# Patient Record
Sex: Female | Born: 1950 | Race: Black or African American | Hispanic: No | Marital: Single | Smoking: Former smoker
Health system: Southern US, Community
[De-identification: ages and names within clinical notes are randomized; demographics above are authoritative.]

## PROBLEM LIST (undated history)

## (undated) DIAGNOSIS — F419 Anxiety disorder, unspecified: Secondary | ICD-10-CM

## (undated) DIAGNOSIS — H269 Unspecified cataract: Secondary | ICD-10-CM

## (undated) DIAGNOSIS — E119 Type 2 diabetes mellitus without complications: Secondary | ICD-10-CM

## (undated) DIAGNOSIS — J189 Pneumonia, unspecified organism: Secondary | ICD-10-CM

## (undated) DIAGNOSIS — H409 Unspecified glaucoma: Secondary | ICD-10-CM

## (undated) DIAGNOSIS — R0609 Other forms of dyspnea: Secondary | ICD-10-CM

## (undated) DIAGNOSIS — E785 Hyperlipidemia, unspecified: Secondary | ICD-10-CM

## (undated) DIAGNOSIS — M545 Low back pain, unspecified: Secondary | ICD-10-CM

## (undated) DIAGNOSIS — R011 Cardiac murmur, unspecified: Secondary | ICD-10-CM

## (undated) DIAGNOSIS — J3801 Paralysis of vocal cords and larynx, unilateral: Secondary | ICD-10-CM

## (undated) DIAGNOSIS — G629 Polyneuropathy, unspecified: Secondary | ICD-10-CM

## (undated) DIAGNOSIS — Z9889 Other specified postprocedural states: Secondary | ICD-10-CM

## (undated) DIAGNOSIS — F32A Depression, unspecified: Secondary | ICD-10-CM

## (undated) DIAGNOSIS — T8859XA Other complications of anesthesia, initial encounter: Secondary | ICD-10-CM

## (undated) DIAGNOSIS — T4145XA Adverse effect of unspecified anesthetic, initial encounter: Secondary | ICD-10-CM

## (undated) DIAGNOSIS — R51 Headache: Secondary | ICD-10-CM

## (undated) DIAGNOSIS — K219 Gastro-esophageal reflux disease without esophagitis: Secondary | ICD-10-CM

## (undated) DIAGNOSIS — I10 Essential (primary) hypertension: Secondary | ICD-10-CM

## (undated) DIAGNOSIS — R053 Chronic cough: Secondary | ICD-10-CM

## (undated) DIAGNOSIS — M810 Age-related osteoporosis without current pathological fracture: Secondary | ICD-10-CM

## (undated) DIAGNOSIS — G4730 Sleep apnea, unspecified: Secondary | ICD-10-CM

## (undated) DIAGNOSIS — T7840XA Allergy, unspecified, initial encounter: Secondary | ICD-10-CM

## (undated) DIAGNOSIS — K30 Functional dyspepsia: Secondary | ICD-10-CM

## (undated) DIAGNOSIS — Z5189 Encounter for other specified aftercare: Secondary | ICD-10-CM

## (undated) DIAGNOSIS — M1990 Unspecified osteoarthritis, unspecified site: Secondary | ICD-10-CM

## (undated) DIAGNOSIS — R0600 Dyspnea, unspecified: Secondary | ICD-10-CM

## (undated) DIAGNOSIS — J449 Chronic obstructive pulmonary disease, unspecified: Secondary | ICD-10-CM

## (undated) DIAGNOSIS — R05 Cough: Secondary | ICD-10-CM

## (undated) DIAGNOSIS — G8929 Other chronic pain: Secondary | ICD-10-CM

## (undated) DIAGNOSIS — I219 Acute myocardial infarction, unspecified: Secondary | ICD-10-CM

## (undated) DIAGNOSIS — F329 Major depressive disorder, single episode, unspecified: Secondary | ICD-10-CM

## (undated) DIAGNOSIS — I209 Angina pectoris, unspecified: Secondary | ICD-10-CM

## (undated) DIAGNOSIS — R55 Syncope and collapse: Secondary | ICD-10-CM

## (undated) DIAGNOSIS — IMO0001 Reserved for inherently not codable concepts without codable children: Secondary | ICD-10-CM

## (undated) HISTORY — DX: Other specified postprocedural states: Z98.890

## (undated) HISTORY — DX: Encounter for other specified aftercare: Z51.89

## (undated) HISTORY — DX: Unspecified cataract: H26.9

## (undated) HISTORY — DX: Acute myocardial infarction, unspecified: I21.9

## (undated) HISTORY — DX: Chronic cough: R05.3

## (undated) HISTORY — DX: Allergy, unspecified, initial encounter: T78.40XA

## (undated) HISTORY — PX: BREAST CYST EXCISION: SHX579

## (undated) HISTORY — DX: Unspecified glaucoma: H40.9

## (undated) HISTORY — DX: Anxiety disorder, unspecified: F41.9

## (undated) HISTORY — PX: SPINE SURGERY: SHX786

## (undated) HISTORY — DX: Hyperlipidemia, unspecified: E78.5

## (undated) HISTORY — DX: Essential (primary) hypertension: I10

## (undated) HISTORY — DX: Age-related osteoporosis without current pathological fracture: M81.0

## (undated) HISTORY — DX: Functional dyspepsia: K30

## (undated) HISTORY — PX: BREAST BIOPSY: SHX20

## (undated) HISTORY — PX: TREATMENT FISTULA ANAL: SUR1390

## (undated) HISTORY — DX: Sleep apnea, unspecified: G47.30

## (undated) HISTORY — PX: REDUCTION MAMMAPLASTY: SUR839

## (undated) HISTORY — PX: CARPAL TUNNEL RELEASE: SHX101

## (undated) HISTORY — PX: CATARACT EXTRACTION: SUR2

## (undated) HISTORY — DX: Cough: R05

## (undated) HISTORY — PX: EYE SURGERY: SHX253

---

## 1985-09-02 HISTORY — PX: TUBAL LIGATION: SHX77

## 1998-01-17 ENCOUNTER — Encounter: Admission: RE | Admit: 1998-01-17 | Discharge: 1998-01-17 | Payer: Self-pay | Admitting: Family Medicine

## 1998-02-20 ENCOUNTER — Encounter: Admission: RE | Admit: 1998-02-20 | Discharge: 1998-02-20 | Payer: Self-pay | Admitting: Family Medicine

## 1998-03-06 ENCOUNTER — Ambulatory Visit: Admission: RE | Admit: 1998-03-06 | Discharge: 1998-03-06 | Payer: Self-pay | Admitting: *Deleted

## 1998-03-07 ENCOUNTER — Encounter: Admission: RE | Admit: 1998-03-07 | Discharge: 1998-03-07 | Payer: Self-pay | Admitting: Family Medicine

## 1998-03-21 ENCOUNTER — Encounter: Admission: RE | Admit: 1998-03-21 | Discharge: 1998-03-21 | Payer: Self-pay | Admitting: Family Medicine

## 1998-04-25 ENCOUNTER — Encounter: Admission: RE | Admit: 1998-04-25 | Discharge: 1998-04-25 | Payer: Self-pay | Admitting: Sports Medicine

## 1998-06-15 ENCOUNTER — Encounter: Admission: RE | Admit: 1998-06-15 | Discharge: 1998-06-15 | Payer: Self-pay | Admitting: Family Medicine

## 1998-08-24 ENCOUNTER — Encounter: Admission: RE | Admit: 1998-08-24 | Discharge: 1998-08-24 | Payer: Self-pay | Admitting: Sports Medicine

## 1998-09-06 ENCOUNTER — Encounter: Admission: RE | Admit: 1998-09-06 | Discharge: 1998-09-06 | Payer: Self-pay | Admitting: Sports Medicine

## 1998-10-06 ENCOUNTER — Encounter: Admission: RE | Admit: 1998-10-06 | Discharge: 1998-10-06 | Payer: Self-pay | Admitting: Family Medicine

## 1998-11-02 ENCOUNTER — Encounter: Admission: RE | Admit: 1998-11-02 | Discharge: 1998-11-02 | Payer: Self-pay | Admitting: Family Medicine

## 1998-11-16 ENCOUNTER — Encounter: Admission: RE | Admit: 1998-11-16 | Discharge: 1998-11-16 | Payer: Self-pay | Admitting: Family Medicine

## 1998-12-04 ENCOUNTER — Encounter: Admission: RE | Admit: 1998-12-04 | Discharge: 1998-12-04 | Payer: Self-pay | Admitting: Family Medicine

## 1998-12-11 ENCOUNTER — Encounter: Admission: RE | Admit: 1998-12-11 | Discharge: 1999-01-10 | Payer: Self-pay | Admitting: *Deleted

## 1999-01-08 ENCOUNTER — Encounter: Admission: RE | Admit: 1999-01-08 | Discharge: 1999-01-08 | Payer: Self-pay | Admitting: Family Medicine

## 1999-01-25 ENCOUNTER — Encounter: Admission: RE | Admit: 1999-01-25 | Discharge: 1999-01-25 | Payer: Self-pay | Admitting: Family Medicine

## 1999-03-30 ENCOUNTER — Encounter: Admission: RE | Admit: 1999-03-30 | Discharge: 1999-03-30 | Payer: Self-pay | Admitting: Family Medicine

## 1999-04-11 ENCOUNTER — Encounter: Admission: RE | Admit: 1999-04-11 | Discharge: 1999-04-11 | Payer: Self-pay | Admitting: Family Medicine

## 1999-05-16 ENCOUNTER — Encounter: Admission: RE | Admit: 1999-05-16 | Discharge: 1999-05-16 | Payer: Self-pay | Admitting: Family Medicine

## 1999-08-06 ENCOUNTER — Encounter: Admission: RE | Admit: 1999-08-06 | Discharge: 1999-08-06 | Payer: Self-pay | Admitting: Family Medicine

## 1999-08-14 ENCOUNTER — Encounter: Admission: RE | Admit: 1999-08-14 | Discharge: 1999-08-14 | Payer: Self-pay | Admitting: Sports Medicine

## 1999-08-23 ENCOUNTER — Encounter: Admission: RE | Admit: 1999-08-23 | Discharge: 1999-08-23 | Payer: Self-pay | Admitting: Family Medicine

## 1999-08-29 ENCOUNTER — Encounter: Admission: RE | Admit: 1999-08-29 | Discharge: 1999-08-29 | Payer: Self-pay | Admitting: Family Medicine

## 1999-10-02 ENCOUNTER — Encounter: Admission: RE | Admit: 1999-10-02 | Discharge: 1999-10-02 | Payer: Self-pay | Admitting: Sports Medicine

## 1999-10-08 ENCOUNTER — Encounter: Admission: RE | Admit: 1999-10-08 | Discharge: 1999-10-08 | Payer: Self-pay | Admitting: Sports Medicine

## 1999-10-17 ENCOUNTER — Encounter: Payer: Self-pay | Admitting: Sports Medicine

## 1999-10-17 ENCOUNTER — Encounter: Admission: RE | Admit: 1999-10-17 | Discharge: 1999-10-17 | Payer: Self-pay | Admitting: Family Medicine

## 1999-10-17 ENCOUNTER — Encounter: Admission: RE | Admit: 1999-10-17 | Discharge: 1999-10-17 | Payer: Self-pay | Admitting: Sports Medicine

## 1999-10-24 ENCOUNTER — Encounter: Admission: RE | Admit: 1999-10-24 | Discharge: 1999-10-24 | Payer: Self-pay | Admitting: Family Medicine

## 1999-11-26 ENCOUNTER — Encounter: Payer: Self-pay | Admitting: Family Medicine

## 1999-11-26 ENCOUNTER — Inpatient Hospital Stay (HOSPITAL_COMMUNITY): Admission: AD | Admit: 1999-11-26 | Discharge: 1999-12-01 | Payer: Self-pay | Admitting: Family Medicine

## 1999-11-26 ENCOUNTER — Encounter: Admission: RE | Admit: 1999-11-26 | Discharge: 1999-11-26 | Payer: Self-pay | Admitting: Family Medicine

## 1999-11-28 ENCOUNTER — Encounter: Payer: Self-pay | Admitting: Family Medicine

## 1999-12-05 ENCOUNTER — Encounter: Admission: RE | Admit: 1999-12-05 | Discharge: 1999-12-05 | Payer: Self-pay | Admitting: Family Medicine

## 1999-12-12 ENCOUNTER — Encounter: Admission: RE | Admit: 1999-12-12 | Discharge: 1999-12-12 | Payer: Self-pay | Admitting: Family Medicine

## 1999-12-27 ENCOUNTER — Encounter: Payer: Self-pay | Admitting: Sports Medicine

## 1999-12-27 ENCOUNTER — Encounter: Admission: RE | Admit: 1999-12-27 | Discharge: 1999-12-27 | Payer: Self-pay | Admitting: Sports Medicine

## 2000-01-07 ENCOUNTER — Encounter: Admission: RE | Admit: 2000-01-07 | Discharge: 2000-01-07 | Payer: Self-pay | Admitting: Family Medicine

## 2000-02-04 ENCOUNTER — Encounter: Admission: RE | Admit: 2000-02-04 | Discharge: 2000-02-04 | Payer: Self-pay | Admitting: Family Medicine

## 2000-03-06 ENCOUNTER — Encounter: Admission: RE | Admit: 2000-03-06 | Discharge: 2000-03-06 | Payer: Self-pay | Admitting: Family Medicine

## 2000-06-16 ENCOUNTER — Encounter: Admission: RE | Admit: 2000-06-16 | Discharge: 2000-06-16 | Payer: Self-pay | Admitting: Family Medicine

## 2000-08-02 HISTORY — PX: KNEE ARTHROSCOPY: SUR90

## 2000-08-02 HISTORY — PX: SHOULDER ARTHROSCOPY: SHX128

## 2000-08-19 ENCOUNTER — Encounter: Admission: RE | Admit: 2000-08-19 | Discharge: 2000-11-17 | Payer: Self-pay | Admitting: Orthopedic Surgery

## 2000-09-10 ENCOUNTER — Ambulatory Visit (HOSPITAL_COMMUNITY): Admission: RE | Admit: 2000-09-10 | Discharge: 2000-09-10 | Payer: Self-pay | Admitting: Orthopedic Surgery

## 2000-09-30 ENCOUNTER — Encounter: Admission: RE | Admit: 2000-09-30 | Discharge: 2000-09-30 | Payer: Self-pay | Admitting: Family Medicine

## 2000-10-07 ENCOUNTER — Encounter: Admission: RE | Admit: 2000-10-07 | Discharge: 2000-10-07 | Payer: Self-pay | Admitting: Sports Medicine

## 2000-10-07 HISTORY — PX: TOTAL ABDOMINAL HYSTERECTOMY: SHX209

## 2000-10-09 ENCOUNTER — Encounter: Admission: RE | Admit: 2000-10-09 | Discharge: 2000-10-09 | Payer: Self-pay | Admitting: Family Medicine

## 2000-11-06 ENCOUNTER — Encounter: Admission: RE | Admit: 2000-11-06 | Discharge: 2000-11-06 | Payer: Self-pay | Admitting: Family Medicine

## 2000-12-02 ENCOUNTER — Encounter: Admission: RE | Admit: 2000-12-02 | Discharge: 2001-01-19 | Payer: Self-pay | Admitting: Orthopedic Surgery

## 2000-12-26 ENCOUNTER — Encounter: Payer: Self-pay | Admitting: Sports Medicine

## 2000-12-26 ENCOUNTER — Encounter: Admission: RE | Admit: 2000-12-26 | Discharge: 2000-12-26 | Payer: Self-pay | Admitting: Family Medicine

## 2000-12-26 ENCOUNTER — Encounter: Admission: RE | Admit: 2000-12-26 | Discharge: 2000-12-26 | Payer: Self-pay | Admitting: Sports Medicine

## 2000-12-29 ENCOUNTER — Encounter: Payer: Self-pay | Admitting: Sports Medicine

## 2000-12-29 ENCOUNTER — Encounter: Admission: RE | Admit: 2000-12-29 | Discharge: 2000-12-29 | Payer: Self-pay | Admitting: Sports Medicine

## 2001-01-08 ENCOUNTER — Encounter: Admission: RE | Admit: 2001-01-08 | Discharge: 2001-01-09 | Payer: Self-pay | Admitting: Sports Medicine

## 2001-01-08 ENCOUNTER — Encounter: Payer: Self-pay | Admitting: Sports Medicine

## 2001-01-15 ENCOUNTER — Encounter: Admission: RE | Admit: 2001-01-15 | Discharge: 2001-01-15 | Payer: Self-pay | Admitting: Family Medicine

## 2001-01-19 ENCOUNTER — Encounter: Admission: RE | Admit: 2001-01-19 | Discharge: 2001-04-19 | Payer: Self-pay | Admitting: Sports Medicine

## 2001-02-24 ENCOUNTER — Encounter: Admission: RE | Admit: 2001-02-24 | Discharge: 2001-02-24 | Payer: Self-pay | Admitting: Family Medicine

## 2001-03-17 ENCOUNTER — Encounter: Admission: RE | Admit: 2001-03-17 | Discharge: 2001-03-17 | Payer: Self-pay | Admitting: Gastroenterology

## 2001-03-17 ENCOUNTER — Encounter: Payer: Self-pay | Admitting: Gastroenterology

## 2001-04-06 ENCOUNTER — Emergency Department (HOSPITAL_COMMUNITY): Admission: EM | Admit: 2001-04-06 | Discharge: 2001-04-06 | Payer: Self-pay | Admitting: Emergency Medicine

## 2001-04-13 ENCOUNTER — Ambulatory Visit (HOSPITAL_COMMUNITY): Admission: RE | Admit: 2001-04-13 | Discharge: 2001-04-13 | Payer: Self-pay | Admitting: Gastroenterology

## 2001-04-22 ENCOUNTER — Encounter: Admission: RE | Admit: 2001-04-22 | Discharge: 2001-04-22 | Payer: Self-pay | Admitting: Family Medicine

## 2001-06-15 ENCOUNTER — Encounter: Admission: RE | Admit: 2001-06-15 | Discharge: 2001-06-15 | Payer: Self-pay | Admitting: Family Medicine

## 2001-06-29 ENCOUNTER — Ambulatory Visit (HOSPITAL_COMMUNITY): Admission: RE | Admit: 2001-06-29 | Discharge: 2001-06-29 | Payer: Self-pay | Admitting: Neurology

## 2001-10-26 ENCOUNTER — Encounter: Admission: RE | Admit: 2001-10-26 | Discharge: 2001-10-26 | Payer: Self-pay | Admitting: Family Medicine

## 2001-11-17 ENCOUNTER — Encounter: Admission: RE | Admit: 2001-11-17 | Discharge: 2001-11-17 | Payer: Self-pay | Admitting: Family Medicine

## 2001-11-24 ENCOUNTER — Encounter: Admission: RE | Admit: 2001-11-24 | Discharge: 2001-11-24 | Payer: Self-pay | Admitting: Family Medicine

## 2001-12-02 ENCOUNTER — Encounter: Admission: RE | Admit: 2001-12-02 | Discharge: 2002-03-02 | Payer: Self-pay

## 2001-12-08 ENCOUNTER — Encounter: Admission: RE | Admit: 2001-12-08 | Discharge: 2001-12-08 | Payer: Self-pay | Admitting: Family Medicine

## 2002-01-12 ENCOUNTER — Encounter: Admission: RE | Admit: 2002-01-12 | Discharge: 2002-01-12 | Payer: Self-pay | Admitting: Family Medicine

## 2002-01-14 ENCOUNTER — Encounter: Admission: RE | Admit: 2002-01-14 | Discharge: 2002-01-14 | Payer: Self-pay | Admitting: Family Medicine

## 2002-01-19 ENCOUNTER — Encounter: Payer: Self-pay | Admitting: Sports Medicine

## 2002-01-19 ENCOUNTER — Encounter: Admission: RE | Admit: 2002-01-19 | Discharge: 2002-01-19 | Payer: Self-pay | Admitting: Sports Medicine

## 2002-02-03 ENCOUNTER — Encounter: Admission: RE | Admit: 2002-02-03 | Discharge: 2002-02-03 | Payer: Self-pay | Admitting: Family Medicine

## 2002-03-15 ENCOUNTER — Encounter: Admission: RE | Admit: 2002-03-15 | Discharge: 2002-03-15 | Payer: Self-pay | Admitting: Family Medicine

## 2002-04-05 ENCOUNTER — Encounter: Admission: RE | Admit: 2002-04-05 | Discharge: 2002-04-05 | Payer: Self-pay | Admitting: Family Medicine

## 2002-05-07 ENCOUNTER — Encounter: Admission: RE | Admit: 2002-05-07 | Discharge: 2002-05-07 | Payer: Self-pay | Admitting: Family Medicine

## 2002-06-01 ENCOUNTER — Emergency Department (HOSPITAL_COMMUNITY): Admission: EM | Admit: 2002-06-01 | Discharge: 2002-06-01 | Payer: Self-pay | Admitting: Emergency Medicine

## 2002-06-14 ENCOUNTER — Encounter: Admission: RE | Admit: 2002-06-14 | Discharge: 2002-06-14 | Payer: Self-pay | Admitting: Family Medicine

## 2002-07-13 ENCOUNTER — Encounter: Payer: Self-pay | Admitting: General Surgery

## 2002-07-20 ENCOUNTER — Ambulatory Visit (HOSPITAL_COMMUNITY): Admission: RE | Admit: 2002-07-20 | Discharge: 2002-07-20 | Payer: Self-pay | Admitting: General Surgery

## 2002-07-20 ENCOUNTER — Encounter (INDEPENDENT_AMBULATORY_CARE_PROVIDER_SITE_OTHER): Payer: Self-pay | Admitting: Specialist

## 2002-08-18 ENCOUNTER — Encounter: Admission: RE | Admit: 2002-08-18 | Discharge: 2002-08-18 | Payer: Self-pay | Admitting: Family Medicine

## 2002-11-01 ENCOUNTER — Encounter: Admission: RE | Admit: 2002-11-01 | Discharge: 2002-11-01 | Payer: Self-pay | Admitting: Family Medicine

## 2002-11-25 ENCOUNTER — Encounter: Admission: RE | Admit: 2002-11-25 | Discharge: 2003-02-23 | Payer: Self-pay

## 2002-12-17 ENCOUNTER — Encounter: Admission: RE | Admit: 2002-12-17 | Discharge: 2002-12-17 | Payer: Self-pay | Admitting: Sports Medicine

## 2002-12-17 ENCOUNTER — Encounter: Payer: Self-pay | Admitting: Sports Medicine

## 2002-12-17 ENCOUNTER — Encounter: Admission: RE | Admit: 2002-12-17 | Discharge: 2002-12-17 | Payer: Self-pay | Admitting: Family Medicine

## 2002-12-27 ENCOUNTER — Encounter: Admission: RE | Admit: 2002-12-27 | Discharge: 2003-01-20 | Payer: Self-pay | Admitting: Sports Medicine

## 2003-02-09 ENCOUNTER — Encounter: Admission: RE | Admit: 2003-02-09 | Discharge: 2003-02-09 | Payer: Self-pay | Admitting: Family Medicine

## 2003-03-14 ENCOUNTER — Encounter: Admission: RE | Admit: 2003-03-14 | Discharge: 2003-03-14 | Payer: Self-pay | Admitting: Family Medicine

## 2003-04-05 ENCOUNTER — Encounter: Admission: RE | Admit: 2003-04-05 | Discharge: 2003-04-05 | Payer: Self-pay | Admitting: Family Medicine

## 2003-04-21 ENCOUNTER — Encounter: Admission: RE | Admit: 2003-04-21 | Discharge: 2003-04-21 | Payer: Self-pay | Admitting: Sports Medicine

## 2003-04-21 HISTORY — PX: BREAST REDUCTION SURGERY: SHX8

## 2003-04-28 ENCOUNTER — Encounter: Admission: RE | Admit: 2003-04-28 | Discharge: 2003-04-28 | Payer: Self-pay | Admitting: Sports Medicine

## 2003-05-17 ENCOUNTER — Encounter: Admission: RE | Admit: 2003-05-17 | Discharge: 2003-05-17 | Payer: Self-pay | Admitting: Sports Medicine

## 2003-06-15 ENCOUNTER — Ambulatory Visit (HOSPITAL_BASED_OUTPATIENT_CLINIC_OR_DEPARTMENT_OTHER): Admission: RE | Admit: 2003-06-15 | Discharge: 2003-06-16 | Payer: Self-pay | Admitting: Orthopedic Surgery

## 2003-06-15 ENCOUNTER — Ambulatory Visit (HOSPITAL_COMMUNITY): Admission: RE | Admit: 2003-06-15 | Discharge: 2003-06-15 | Payer: Self-pay | Admitting: Orthopedic Surgery

## 2003-07-13 ENCOUNTER — Ambulatory Visit (HOSPITAL_COMMUNITY): Admission: RE | Admit: 2003-07-13 | Discharge: 2003-07-13 | Payer: Self-pay | Admitting: Sports Medicine

## 2003-07-13 ENCOUNTER — Encounter (INDEPENDENT_AMBULATORY_CARE_PROVIDER_SITE_OTHER): Payer: Self-pay | Admitting: Specialist

## 2003-07-13 ENCOUNTER — Encounter: Admission: RE | Admit: 2003-07-13 | Discharge: 2003-07-13 | Payer: Self-pay | Admitting: Sports Medicine

## 2003-09-14 ENCOUNTER — Encounter: Admission: RE | Admit: 2003-09-14 | Discharge: 2003-09-14 | Payer: Self-pay | Admitting: Sports Medicine

## 2003-09-22 ENCOUNTER — Encounter: Admission: RE | Admit: 2003-09-22 | Discharge: 2003-09-22 | Payer: Self-pay | Admitting: Family Medicine

## 2003-09-29 ENCOUNTER — Encounter: Admission: RE | Admit: 2003-09-29 | Discharge: 2003-09-29 | Payer: Self-pay | Admitting: Family Medicine

## 2003-10-05 ENCOUNTER — Encounter: Admission: RE | Admit: 2003-10-05 | Discharge: 2003-10-05 | Payer: Self-pay | Admitting: Sports Medicine

## 2003-10-18 ENCOUNTER — Encounter: Admission: RE | Admit: 2003-10-18 | Discharge: 2003-10-18 | Payer: Self-pay | Admitting: Family Medicine

## 2003-10-18 HISTORY — PX: SHOULDER ARTHROSCOPY W/ ROTATOR CUFF REPAIR: SHX2400

## 2003-10-20 ENCOUNTER — Ambulatory Visit (HOSPITAL_COMMUNITY): Admission: RE | Admit: 2003-10-20 | Discharge: 2003-10-21 | Payer: Self-pay | Admitting: *Deleted

## 2003-11-07 ENCOUNTER — Encounter: Admission: RE | Admit: 2003-11-07 | Discharge: 2003-11-07 | Payer: Self-pay | Admitting: Family Medicine

## 2003-11-22 ENCOUNTER — Encounter: Admission: RE | Admit: 2003-11-22 | Discharge: 2003-11-22 | Payer: Self-pay | Admitting: Family Medicine

## 2003-12-22 ENCOUNTER — Ambulatory Visit (HOSPITAL_COMMUNITY): Admission: RE | Admit: 2003-12-22 | Discharge: 2003-12-22 | Payer: Self-pay | Admitting: Family Medicine

## 2003-12-22 ENCOUNTER — Encounter: Admission: RE | Admit: 2003-12-22 | Discharge: 2003-12-22 | Payer: Self-pay | Admitting: Family Medicine

## 2004-01-04 ENCOUNTER — Ambulatory Visit (HOSPITAL_COMMUNITY): Admission: RE | Admit: 2004-01-04 | Discharge: 2004-01-04 | Payer: Self-pay | Admitting: Sports Medicine

## 2004-01-04 ENCOUNTER — Encounter: Admission: RE | Admit: 2004-01-04 | Discharge: 2004-01-04 | Payer: Self-pay | Admitting: Family Medicine

## 2004-01-18 ENCOUNTER — Encounter: Admission: RE | Admit: 2004-01-18 | Discharge: 2004-01-18 | Payer: Self-pay | Admitting: Sports Medicine

## 2004-01-31 ENCOUNTER — Encounter: Admission: RE | Admit: 2004-01-31 | Discharge: 2004-01-31 | Payer: Self-pay | Admitting: Sports Medicine

## 2004-02-01 ENCOUNTER — Encounter: Admission: RE | Admit: 2004-02-01 | Discharge: 2004-02-01 | Payer: Self-pay | Admitting: Sports Medicine

## 2004-02-08 ENCOUNTER — Encounter: Admission: RE | Admit: 2004-02-08 | Discharge: 2004-02-08 | Payer: Self-pay | Admitting: Sports Medicine

## 2004-02-22 ENCOUNTER — Encounter: Admission: RE | Admit: 2004-02-22 | Discharge: 2004-02-22 | Payer: Self-pay | Admitting: Sports Medicine

## 2004-03-02 HISTORY — PX: CARDIAC CATHETERIZATION: SHX172

## 2004-03-14 ENCOUNTER — Encounter: Admission: RE | Admit: 2004-03-14 | Discharge: 2004-03-14 | Payer: Self-pay | Admitting: Sports Medicine

## 2004-04-09 ENCOUNTER — Emergency Department (HOSPITAL_COMMUNITY): Admission: EM | Admit: 2004-04-09 | Discharge: 2004-04-09 | Payer: Self-pay | Admitting: *Deleted

## 2004-04-09 ENCOUNTER — Encounter: Admission: RE | Admit: 2004-04-09 | Discharge: 2004-04-09 | Payer: Self-pay | Admitting: Sports Medicine

## 2004-04-17 ENCOUNTER — Encounter: Admission: RE | Admit: 2004-04-17 | Discharge: 2004-04-17 | Payer: Self-pay | Admitting: Sports Medicine

## 2004-06-12 ENCOUNTER — Ambulatory Visit: Payer: Self-pay | Admitting: Sports Medicine

## 2004-07-17 ENCOUNTER — Ambulatory Visit: Payer: Self-pay | Admitting: *Deleted

## 2004-08-06 ENCOUNTER — Ambulatory Visit: Payer: Self-pay | Admitting: Sports Medicine

## 2004-08-14 ENCOUNTER — Ambulatory Visit: Payer: Self-pay | Admitting: Sports Medicine

## 2004-09-02 HISTORY — PX: OTHER SURGICAL HISTORY: SHX169

## 2004-09-20 ENCOUNTER — Ambulatory Visit: Payer: Self-pay | Admitting: Family Medicine

## 2004-10-23 ENCOUNTER — Ambulatory Visit: Payer: Self-pay | Admitting: Sports Medicine

## 2004-12-11 ENCOUNTER — Ambulatory Visit: Payer: Self-pay | Admitting: Family Medicine

## 2005-01-09 ENCOUNTER — Ambulatory Visit (HOSPITAL_COMMUNITY): Admission: RE | Admit: 2005-01-09 | Discharge: 2005-01-09 | Payer: Self-pay | Admitting: Sports Medicine

## 2005-01-09 ENCOUNTER — Ambulatory Visit: Payer: Self-pay | Admitting: Sports Medicine

## 2005-01-22 ENCOUNTER — Ambulatory Visit: Payer: Self-pay | Admitting: *Deleted

## 2005-01-25 ENCOUNTER — Ambulatory Visit: Payer: Self-pay | Admitting: *Deleted

## 2005-02-12 ENCOUNTER — Inpatient Hospital Stay (HOSPITAL_COMMUNITY): Admission: AD | Admit: 2005-02-12 | Discharge: 2005-02-14 | Payer: Self-pay | Admitting: Family Medicine

## 2005-02-12 ENCOUNTER — Ambulatory Visit: Payer: Self-pay | Admitting: Sports Medicine

## 2005-02-12 ENCOUNTER — Ambulatory Visit: Payer: Self-pay | Admitting: Family Medicine

## 2005-03-01 ENCOUNTER — Ambulatory Visit: Payer: Self-pay | Admitting: Sports Medicine

## 2005-03-06 ENCOUNTER — Ambulatory Visit: Payer: Self-pay | Admitting: Sports Medicine

## 2005-03-26 ENCOUNTER — Encounter: Admission: RE | Admit: 2005-03-26 | Discharge: 2005-03-26 | Payer: Self-pay | Admitting: Sports Medicine

## 2005-04-18 ENCOUNTER — Encounter: Admission: RE | Admit: 2005-04-18 | Discharge: 2005-04-18 | Payer: Self-pay | Admitting: Sports Medicine

## 2005-04-18 ENCOUNTER — Ambulatory Visit: Payer: Self-pay | Admitting: Family Medicine

## 2005-05-07 ENCOUNTER — Ambulatory Visit: Payer: Self-pay | Admitting: Sports Medicine

## 2005-05-08 ENCOUNTER — Encounter: Admission: RE | Admit: 2005-05-08 | Discharge: 2005-05-08 | Payer: Self-pay | Admitting: Sports Medicine

## 2005-05-14 ENCOUNTER — Ambulatory Visit: Payer: Self-pay | Admitting: *Deleted

## 2005-07-02 ENCOUNTER — Ambulatory Visit: Payer: Self-pay | Admitting: Sports Medicine

## 2005-07-15 ENCOUNTER — Ambulatory Visit (HOSPITAL_BASED_OUTPATIENT_CLINIC_OR_DEPARTMENT_OTHER): Admission: RE | Admit: 2005-07-15 | Discharge: 2005-07-15 | Payer: Self-pay | Admitting: Family Medicine

## 2005-07-21 ENCOUNTER — Ambulatory Visit: Payer: Self-pay | Admitting: Internal Medicine

## 2005-08-06 ENCOUNTER — Ambulatory Visit: Payer: Self-pay | Admitting: Sports Medicine

## 2005-09-26 ENCOUNTER — Ambulatory Visit: Payer: Self-pay | Admitting: Family Medicine

## 2005-10-01 ENCOUNTER — Ambulatory Visit: Payer: Self-pay | Admitting: Sports Medicine

## 2005-10-08 ENCOUNTER — Ambulatory Visit: Payer: Self-pay | Admitting: Sports Medicine

## 2005-11-08 ENCOUNTER — Ambulatory Visit (HOSPITAL_COMMUNITY): Admission: RE | Admit: 2005-11-08 | Discharge: 2005-11-08 | Payer: Self-pay | Admitting: Gastroenterology

## 2005-11-13 ENCOUNTER — Ambulatory Visit: Payer: Self-pay | Admitting: *Deleted

## 2005-12-11 ENCOUNTER — Encounter: Admission: RE | Admit: 2005-12-11 | Discharge: 2005-12-11 | Payer: Self-pay | Admitting: Sports Medicine

## 2006-01-14 ENCOUNTER — Ambulatory Visit: Payer: Self-pay | Admitting: Sports Medicine

## 2006-02-10 ENCOUNTER — Ambulatory Visit: Payer: Self-pay | Admitting: Sports Medicine

## 2006-02-18 ENCOUNTER — Ambulatory Visit: Payer: Self-pay | Admitting: Sports Medicine

## 2006-02-18 ENCOUNTER — Ambulatory Visit (HOSPITAL_COMMUNITY): Admission: RE | Admit: 2006-02-18 | Discharge: 2006-02-18 | Payer: Self-pay | Admitting: Family Medicine

## 2006-02-24 ENCOUNTER — Ambulatory Visit: Payer: Self-pay | Admitting: Family Medicine

## 2006-03-11 ENCOUNTER — Ambulatory Visit: Payer: Self-pay | Admitting: Sports Medicine

## 2006-03-12 ENCOUNTER — Encounter: Payer: Self-pay | Admitting: Vascular Surgery

## 2006-03-12 ENCOUNTER — Ambulatory Visit (HOSPITAL_COMMUNITY): Admission: RE | Admit: 2006-03-12 | Discharge: 2006-03-12 | Payer: Self-pay | Admitting: Sports Medicine

## 2006-03-20 ENCOUNTER — Ambulatory Visit: Payer: Self-pay | Admitting: Sports Medicine

## 2006-03-27 ENCOUNTER — Encounter: Admission: RE | Admit: 2006-03-27 | Discharge: 2006-03-27 | Payer: Self-pay | Admitting: Sports Medicine

## 2006-04-16 ENCOUNTER — Ambulatory Visit (HOSPITAL_COMMUNITY): Admission: RE | Admit: 2006-04-16 | Discharge: 2006-04-16 | Payer: Self-pay | Admitting: Sports Medicine

## 2006-04-16 ENCOUNTER — Ambulatory Visit: Payer: Self-pay | Admitting: Sports Medicine

## 2006-04-17 ENCOUNTER — Ambulatory Visit: Payer: Self-pay | Admitting: Family Medicine

## 2006-04-22 ENCOUNTER — Ambulatory Visit: Payer: Self-pay | Admitting: Sports Medicine

## 2006-05-06 ENCOUNTER — Emergency Department (HOSPITAL_COMMUNITY): Admission: EM | Admit: 2006-05-06 | Discharge: 2006-05-06 | Payer: Self-pay | Admitting: Emergency Medicine

## 2006-05-20 ENCOUNTER — Ambulatory Visit: Payer: Self-pay | Admitting: *Deleted

## 2006-06-26 ENCOUNTER — Ambulatory Visit: Payer: Self-pay | Admitting: Family Medicine

## 2006-07-08 ENCOUNTER — Ambulatory Visit: Payer: Self-pay | Admitting: Sports Medicine

## 2006-07-08 ENCOUNTER — Ambulatory Visit (HOSPITAL_COMMUNITY): Admission: RE | Admit: 2006-07-08 | Discharge: 2006-07-08 | Payer: Self-pay | Admitting: Family Medicine

## 2006-07-22 ENCOUNTER — Ambulatory Visit (HOSPITAL_COMMUNITY): Admission: RE | Admit: 2006-07-22 | Discharge: 2006-07-22 | Payer: Self-pay | Admitting: Sports Medicine

## 2006-07-29 ENCOUNTER — Ambulatory Visit: Payer: Self-pay | Admitting: Sports Medicine

## 2006-08-21 ENCOUNTER — Ambulatory Visit (HOSPITAL_COMMUNITY): Admission: RE | Admit: 2006-08-21 | Discharge: 2006-08-21 | Payer: Self-pay | Admitting: Sports Medicine

## 2006-09-15 ENCOUNTER — Ambulatory Visit: Payer: Self-pay | Admitting: Pulmonary Disease

## 2006-09-24 ENCOUNTER — Encounter (INDEPENDENT_AMBULATORY_CARE_PROVIDER_SITE_OTHER): Payer: Self-pay | Admitting: Sports Medicine

## 2006-09-25 ENCOUNTER — Ambulatory Visit: Payer: Self-pay | Admitting: Family Medicine

## 2006-09-25 ENCOUNTER — Encounter (INDEPENDENT_AMBULATORY_CARE_PROVIDER_SITE_OTHER): Payer: Self-pay | Admitting: *Deleted

## 2006-09-25 LAB — CONVERTED CEMR LAB
ALT: 19 units/L (ref 0–35)
AST: 22 units/L (ref 0–37)
Albumin: 4.2 g/dL (ref 3.5–5.2)
Alkaline Phosphatase: 81 units/L (ref 39–117)
BUN: 16 mg/dL (ref 6–23)
CO2: 29 meq/L (ref 19–32)
Calcium: 9.6 mg/dL (ref 8.4–10.5)
Chloride: 104 meq/L (ref 96–112)
Creatinine, Ser: 0.87 mg/dL (ref 0.40–1.20)
Glucose, Bld: 95 mg/dL (ref 70–99)
Potassium: 3.9 meq/L (ref 3.5–5.3)
Sodium: 143 meq/L (ref 135–145)
Total Bilirubin: 0.3 mg/dL (ref 0.3–1.2)
Total Protein: 7.2 g/dL (ref 6.0–8.3)

## 2006-09-30 ENCOUNTER — Ambulatory Visit: Payer: Self-pay | Admitting: Family Medicine

## 2006-10-06 ENCOUNTER — Ambulatory Visit (HOSPITAL_COMMUNITY): Admission: RE | Admit: 2006-10-06 | Discharge: 2006-10-06 | Payer: Self-pay | Admitting: Gastroenterology

## 2006-10-10 ENCOUNTER — Ambulatory Visit: Payer: Self-pay | Admitting: Family Medicine

## 2006-10-27 ENCOUNTER — Ambulatory Visit: Payer: Self-pay | Admitting: Pulmonary Disease

## 2006-10-30 DIAGNOSIS — E1149 Type 2 diabetes mellitus with other diabetic neurological complication: Secondary | ICD-10-CM | POA: Insufficient documentation

## 2006-10-30 DIAGNOSIS — K589 Irritable bowel syndrome without diarrhea: Secondary | ICD-10-CM | POA: Insufficient documentation

## 2006-10-30 DIAGNOSIS — G4733 Obstructive sleep apnea (adult) (pediatric): Secondary | ICD-10-CM | POA: Insufficient documentation

## 2006-10-30 DIAGNOSIS — F339 Major depressive disorder, recurrent, unspecified: Secondary | ICD-10-CM | POA: Insufficient documentation

## 2006-10-30 DIAGNOSIS — I10 Essential (primary) hypertension: Secondary | ICD-10-CM | POA: Insufficient documentation

## 2006-10-30 DIAGNOSIS — H409 Unspecified glaucoma: Secondary | ICD-10-CM | POA: Insufficient documentation

## 2006-11-05 ENCOUNTER — Ambulatory Visit: Payer: Self-pay | Admitting: *Deleted

## 2006-11-12 ENCOUNTER — Ambulatory Visit: Payer: Self-pay | Admitting: Pulmonary Disease

## 2006-11-19 ENCOUNTER — Ambulatory Visit: Payer: Self-pay | Admitting: Pulmonary Disease

## 2006-11-20 ENCOUNTER — Ambulatory Visit: Payer: Self-pay | Admitting: *Deleted

## 2006-11-20 ENCOUNTER — Ambulatory Visit: Payer: Self-pay

## 2006-11-20 ENCOUNTER — Encounter: Payer: Self-pay | Admitting: Cardiology

## 2006-11-20 LAB — CONVERTED CEMR LAB
ALT: 23 units/L (ref 0–40)
AST: 26 units/L (ref 0–37)
Albumin: 3.5 g/dL (ref 3.5–5.2)
Alkaline Phosphatase: 65 units/L (ref 39–117)
BUN: 16 mg/dL (ref 6–23)
Bilirubin, Direct: 0.1 mg/dL (ref 0.0–0.3)
CO2: 30 meq/L (ref 19–32)
Calcium: 9.3 mg/dL (ref 8.4–10.5)
Chloride: 104 meq/L (ref 96–112)
Cholesterol: 127 mg/dL (ref 0–200)
Creatinine, Ser: 0.9 mg/dL (ref 0.4–1.2)
GFR calc Af Amer: 84 mL/min
GFR calc non Af Amer: 69 mL/min
Glucose, Bld: 139 mg/dL — ABNORMAL HIGH (ref 70–99)
HDL: 46 mg/dL (ref >39.0)
LDL Cholesterol: 69 mg/dL (ref 0–99)
Potassium: 3.8 meq/L (ref 3.5–5.1)
Sodium: 142 meq/L (ref 135–145)
Total Bilirubin: 0.4 mg/dL (ref 0.3–1.2)
Total CHOL/HDL Ratio: 2.8
Total Protein: 6.8 g/dL (ref 6.0–8.3)
Triglycerides: 61 mg/dL (ref 0–149)
VLDL: 12 mg/dL (ref 0–40)

## 2006-11-25 ENCOUNTER — Ambulatory Visit: Payer: Self-pay | Admitting: Sports Medicine

## 2006-11-25 LAB — CONVERTED CEMR LAB
ALT: 21 units/L (ref 0–35)
AST: 17 units/L (ref 0–37)
Albumin: 4 g/dL (ref 3.5–5.2)
Alkaline Phosphatase: 69 units/L (ref 39–117)
BUN: 20 mg/dL (ref 6–23)
CO2: 29 meq/L (ref 19–32)
Calcium: 9.2 mg/dL (ref 8.4–10.5)
Chloride: 103 meq/L (ref 96–112)
Creatinine, Ser: 0.89 mg/dL (ref 0.40–1.20)
Glucose, Bld: 108 mg/dL — ABNORMAL HIGH (ref 70–99)
Hgb A1c MFr Bld: 5.5 %
Potassium: 3.6 meq/L (ref 3.5–5.3)
Sodium: 143 meq/L (ref 135–145)
TSH: 2.233 microintl units/mL (ref 0.350–5.50)
Total Bilirubin: 0.2 mg/dL — ABNORMAL LOW (ref 0.3–1.2)
Total Protein: 6.9 g/dL (ref 6.0–8.3)

## 2006-11-28 ENCOUNTER — Ambulatory Visit (HOSPITAL_COMMUNITY): Admission: RE | Admit: 2006-11-28 | Discharge: 2006-11-28 | Payer: Self-pay | Admitting: Sports Medicine

## 2006-12-01 ENCOUNTER — Telehealth: Payer: Self-pay | Admitting: *Deleted

## 2006-12-12 ENCOUNTER — Ambulatory Visit: Payer: Self-pay | Admitting: Family Medicine

## 2006-12-12 DIAGNOSIS — K219 Gastro-esophageal reflux disease without esophagitis: Secondary | ICD-10-CM | POA: Insufficient documentation

## 2006-12-23 ENCOUNTER — Ambulatory Visit: Payer: Self-pay | Admitting: Sports Medicine

## 2007-01-02 ENCOUNTER — Ambulatory Visit: Payer: Self-pay | Admitting: Pulmonary Disease

## 2007-02-17 ENCOUNTER — Ambulatory Visit: Payer: Self-pay | Admitting: Sports Medicine

## 2007-02-17 DIAGNOSIS — J3801 Paralysis of vocal cords and larynx, unilateral: Secondary | ICD-10-CM | POA: Insufficient documentation

## 2007-02-24 ENCOUNTER — Ambulatory Visit: Payer: Self-pay | Admitting: Sports Medicine

## 2007-03-03 ENCOUNTER — Ambulatory Visit: Payer: Self-pay | Admitting: Family Medicine

## 2007-03-03 LAB — CONVERTED CEMR LAB: Hgb A1c MFr Bld: 6.8 %

## 2007-03-10 ENCOUNTER — Ambulatory Visit: Payer: Self-pay | Admitting: Internal Medicine

## 2007-03-18 ENCOUNTER — Ambulatory Visit: Payer: Self-pay | Admitting: Internal Medicine

## 2007-03-19 ENCOUNTER — Telehealth (INDEPENDENT_AMBULATORY_CARE_PROVIDER_SITE_OTHER): Payer: Self-pay | Admitting: *Deleted

## 2007-03-20 ENCOUNTER — Encounter: Payer: Self-pay | Admitting: Family Medicine

## 2007-03-23 ENCOUNTER — Ambulatory Visit: Payer: Self-pay | Admitting: Pulmonary Disease

## 2007-04-08 ENCOUNTER — Ambulatory Visit: Payer: Self-pay | Admitting: Internal Medicine

## 2007-04-08 LAB — CONVERTED CEMR LAB
BUN: 10 mg/dL (ref 6–23)
Basophils Absolute: 0 10*3/uL (ref 0.0–0.1)
Basophils Relative: 0.3 % (ref 0.0–1.0)
CO2: 33 meq/L — ABNORMAL HIGH (ref 19–32)
Calcium: 9.8 mg/dL (ref 8.4–10.5)
Chloride: 98 meq/L (ref 96–112)
Creatinine, Ser: 0.9 mg/dL (ref 0.4–1.2)
Eosinophils Absolute: 0.1 10*3/uL (ref 0.0–0.6)
Eosinophils Relative: 1.3 % (ref 0.0–5.0)
Free T4: 0.7 ng/dL (ref 0.6–1.6)
GFR calc Af Amer: 84 mL/min
GFR calc non Af Amer: 69 mL/min
Glucose, Bld: 115 mg/dL — ABNORMAL HIGH (ref 70–99)
HCT: 35.3 % — ABNORMAL LOW (ref 36.0–46.0)
Hemoglobin: 11.9 g/dL — ABNORMAL LOW (ref 12.0–15.0)
Lymphocytes Relative: 28.4 % (ref 12.0–46.0)
MCHC: 33.8 g/dL (ref 30.0–36.0)
MCV: 83 fL (ref 78.0–100.0)
Monocytes Absolute: 0.6 10*3/uL (ref 0.2–0.7)
Monocytes Relative: 5.6 % (ref 3.0–11.0)
Neutro Abs: 7.2 10*3/uL (ref 1.4–7.7)
Neutrophils Relative %: 64.4 % (ref 43.0–77.0)
Platelets: 309 10*3/uL (ref 150–400)
Potassium: 3.5 meq/L (ref 3.5–5.1)
RBC: 4.25 M/uL (ref 3.87–5.11)
RDW: 15 % — ABNORMAL HIGH (ref 11.5–14.6)
Sodium: 142 meq/L (ref 135–145)
T3, Free: 2.8 pg/mL (ref 2.3–4.2)
TSH: 2.12 microintl units/mL (ref 0.35–5.50)
WBC: 11.1 10*3/uL — ABNORMAL HIGH (ref 4.5–10.5)

## 2007-04-09 ENCOUNTER — Ambulatory Visit: Payer: Self-pay | Admitting: Family Medicine

## 2007-04-13 ENCOUNTER — Ambulatory Visit (HOSPITAL_COMMUNITY): Admission: RE | Admit: 2007-04-13 | Discharge: 2007-04-13 | Payer: Self-pay | Admitting: Family Medicine

## 2007-04-13 ENCOUNTER — Encounter: Admission: RE | Admit: 2007-04-13 | Discharge: 2007-04-13 | Payer: Self-pay | Admitting: Sports Medicine

## 2007-04-13 ENCOUNTER — Ambulatory Visit: Payer: Self-pay | Admitting: Family Medicine

## 2007-04-13 ENCOUNTER — Encounter: Payer: Self-pay | Admitting: Family Medicine

## 2007-04-13 LAB — CONVERTED CEMR LAB: Blood Glucose, Fingerstick: 220

## 2007-04-15 ENCOUNTER — Encounter: Payer: Self-pay | Admitting: Family Medicine

## 2007-04-22 ENCOUNTER — Ambulatory Visit: Payer: Self-pay | Admitting: Family Medicine

## 2007-04-27 ENCOUNTER — Ambulatory Visit: Payer: Self-pay | Admitting: Pulmonary Disease

## 2007-05-21 ENCOUNTER — Ambulatory Visit: Payer: Self-pay | Admitting: Family Medicine

## 2007-05-25 ENCOUNTER — Ambulatory Visit: Payer: Self-pay | Admitting: Family Medicine

## 2007-05-26 ENCOUNTER — Telehealth: Payer: Self-pay | Admitting: Family Medicine

## 2007-06-04 ENCOUNTER — Ambulatory Visit: Payer: Self-pay | Admitting: Family Medicine

## 2007-07-02 ENCOUNTER — Ambulatory Visit: Payer: Self-pay | Admitting: Family Medicine

## 2007-07-02 ENCOUNTER — Encounter: Payer: Self-pay | Admitting: Family Medicine

## 2007-07-02 LAB — CONVERTED CEMR LAB
BUN: 17 mg/dL (ref 6–23)
CO2: 31 meq/L (ref 19–32)
Calcium: 10 mg/dL (ref 8.4–10.5)
Chloride: 97 meq/L (ref 96–112)
Creatinine, Ser: 0.93 mg/dL (ref 0.40–1.20)
Direct LDL: 55 mg/dL
Glucose, Bld: 120 mg/dL — ABNORMAL HIGH (ref 70–99)
Hgb A1c MFr Bld: 6.9 %
Potassium: 3.4 meq/L — ABNORMAL LOW (ref 3.5–5.3)
Sodium: 141 meq/L (ref 135–145)

## 2007-07-04 HISTORY — PX: BRONCHOSCOPY: SUR163

## 2007-07-06 ENCOUNTER — Encounter: Payer: Self-pay | Admitting: Family Medicine

## 2007-07-09 ENCOUNTER — Telehealth: Payer: Self-pay | Admitting: Family Medicine

## 2007-07-22 ENCOUNTER — Telehealth: Payer: Self-pay | Admitting: *Deleted

## 2007-07-23 ENCOUNTER — Telehealth: Payer: Self-pay | Admitting: *Deleted

## 2007-07-23 ENCOUNTER — Ambulatory Visit: Payer: Self-pay | Admitting: Family Medicine

## 2007-07-27 ENCOUNTER — Ambulatory Visit: Payer: Self-pay | Admitting: Pulmonary Disease

## 2007-07-27 ENCOUNTER — Telehealth: Payer: Self-pay | Admitting: *Deleted

## 2007-07-29 ENCOUNTER — Encounter: Payer: Self-pay | Admitting: Pulmonary Disease

## 2007-07-29 ENCOUNTER — Ambulatory Visit: Payer: Self-pay | Admitting: Pulmonary Disease

## 2007-07-29 ENCOUNTER — Ambulatory Visit: Admission: RE | Admit: 2007-07-29 | Discharge: 2007-07-29 | Payer: Self-pay | Admitting: Pulmonary Disease

## 2007-08-04 ENCOUNTER — Encounter: Payer: Self-pay | Admitting: Pulmonary Disease

## 2007-08-05 ENCOUNTER — Ambulatory Visit: Payer: Self-pay | Admitting: Family Medicine

## 2007-08-10 ENCOUNTER — Ambulatory Visit: Payer: Self-pay | Admitting: Pulmonary Disease

## 2007-08-11 ENCOUNTER — Encounter: Payer: Self-pay | Admitting: Family Medicine

## 2007-09-10 ENCOUNTER — Ambulatory Visit: Payer: Self-pay | Admitting: Internal Medicine

## 2007-09-10 DIAGNOSIS — Z9849 Cataract extraction status, unspecified eye: Secondary | ICD-10-CM | POA: Insufficient documentation

## 2007-09-10 LAB — CONVERTED CEMR LAB
Basophils Absolute: 0.1 10*3/uL (ref 0.0–0.1)
Basophils Relative: 0.5 % (ref 0.0–1.0)
Eosinophils Absolute: 0.2 10*3/uL (ref 0.0–0.6)
Eosinophils Relative: 1.4 % (ref 0.0–5.0)
HCT: 34 % — ABNORMAL LOW (ref 36.0–46.0)
Hemoglobin: 11.5 g/dL — ABNORMAL LOW (ref 12.0–15.0)
IgE (Immunoglobulin E), Serum: 41.8 intl units/mL (ref 0.0–180.0)
Lymphocytes Relative: 33.6 % (ref 12.0–46.0)
MCHC: 33.8 g/dL (ref 30.0–36.0)
MCV: 84 fL (ref 78.0–100.0)
Monocytes Absolute: 0.7 10*3/uL (ref 0.2–0.7)
Monocytes Relative: 6.2 % (ref 3.0–11.0)
Neutro Abs: 6.4 10*3/uL (ref 1.4–7.7)
Neutrophils Relative %: 58.3 % (ref 43.0–77.0)
Platelets: 299 10*3/uL (ref 150–400)
RBC: 4.05 M/uL (ref 3.87–5.11)
RDW: 14.8 % — ABNORMAL HIGH (ref 11.5–14.6)
WBC: 11.1 10*3/uL — ABNORMAL HIGH (ref 4.5–10.5)

## 2007-09-15 ENCOUNTER — Telehealth: Payer: Self-pay | Admitting: Internal Medicine

## 2007-09-15 ENCOUNTER — Ambulatory Visit: Payer: Self-pay | Admitting: Family Medicine

## 2007-09-17 ENCOUNTER — Telehealth: Payer: Self-pay | Admitting: *Deleted

## 2007-09-18 ENCOUNTER — Ambulatory Visit: Payer: Self-pay | Admitting: Internal Medicine

## 2007-10-13 ENCOUNTER — Ambulatory Visit: Payer: Self-pay | Admitting: Internal Medicine

## 2007-10-15 ENCOUNTER — Ambulatory Visit: Payer: Self-pay | Admitting: Internal Medicine

## 2007-10-15 ENCOUNTER — Ambulatory Visit: Payer: Self-pay | Admitting: Family Medicine

## 2007-10-16 ENCOUNTER — Ambulatory Visit: Payer: Self-pay | Admitting: Internal Medicine

## 2007-10-20 ENCOUNTER — Ambulatory Visit: Payer: Self-pay | Admitting: Internal Medicine

## 2007-10-27 ENCOUNTER — Ambulatory Visit: Payer: Self-pay | Admitting: Internal Medicine

## 2007-10-28 ENCOUNTER — Telehealth: Payer: Self-pay | Admitting: *Deleted

## 2007-10-28 ENCOUNTER — Ambulatory Visit: Payer: Self-pay | Admitting: Family Medicine

## 2007-10-28 LAB — CONVERTED CEMR LAB
ALT: 47 units/L — ABNORMAL HIGH (ref 0–35)
AST: 38 units/L — ABNORMAL HIGH (ref 0–37)
Albumin: 4 g/dL (ref 3.5–5.2)
Alkaline Phosphatase: 79 units/L (ref 39–117)
BUN: 11 mg/dL (ref 6–23)
CO2: 28 meq/L (ref 19–32)
Calcium: 9.3 mg/dL (ref 8.4–10.5)
Chloride: 109 meq/L (ref 96–112)
Creatinine, Ser: 0.77 mg/dL (ref 0.40–1.20)
Glucose, Bld: 99 mg/dL (ref 70–99)
Potassium: 3.8 meq/L (ref 3.5–5.3)
Sodium: 152 meq/L — ABNORMAL HIGH (ref 135–145)
Total Bilirubin: 0.3 mg/dL (ref 0.3–1.2)
Total Protein: 7 g/dL (ref 6.0–8.3)

## 2007-10-30 ENCOUNTER — Ambulatory Visit: Payer: Self-pay | Admitting: Internal Medicine

## 2007-11-03 ENCOUNTER — Encounter: Payer: Self-pay | Admitting: Family Medicine

## 2007-11-03 ENCOUNTER — Ambulatory Visit: Payer: Self-pay | Admitting: Internal Medicine

## 2007-11-03 LAB — CONVERTED CEMR LAB
ALT: 49 units/L — ABNORMAL HIGH (ref 0–35)
AST: 60 units/L — ABNORMAL HIGH (ref 0–37)
Cholesterol: 134 mg/dL (ref 0–200)
HDL: 26.5 mg/dL — ABNORMAL LOW (ref >39.0)
LDL Cholesterol: 79 mg/dL (ref 0–99)
Total CHOL/HDL Ratio: 5.1
Triglycerides: 145 mg/dL (ref 0–149)
VLDL: 29 mg/dL (ref 0–40)

## 2007-11-06 ENCOUNTER — Ambulatory Visit: Payer: Self-pay | Admitting: Internal Medicine

## 2007-11-09 ENCOUNTER — Ambulatory Visit: Payer: Self-pay | Admitting: Internal Medicine

## 2007-11-09 ENCOUNTER — Ambulatory Visit: Payer: Self-pay | Admitting: Pulmonary Disease

## 2007-11-11 ENCOUNTER — Ambulatory Visit: Payer: Self-pay | Admitting: Internal Medicine

## 2007-11-12 ENCOUNTER — Encounter: Payer: Self-pay | Admitting: Family Medicine

## 2007-11-12 ENCOUNTER — Ambulatory Visit: Payer: Self-pay | Admitting: Family Medicine

## 2007-11-12 LAB — CONVERTED CEMR LAB
BUN: 16 mg/dL (ref 6–23)
CO2: 30 meq/L (ref 19–32)
Calcium: 9.6 mg/dL (ref 8.4–10.5)
Chloride: 100 meq/L (ref 96–112)
Creatinine, Ser: 0.79 mg/dL (ref 0.40–1.20)
Glucose, Bld: 135 mg/dL — ABNORMAL HIGH (ref 70–99)
Potassium: 4 meq/L (ref 3.5–5.3)
Sodium: 144 meq/L (ref 135–145)

## 2007-11-16 ENCOUNTER — Encounter: Payer: Self-pay | Admitting: Family Medicine

## 2007-11-16 ENCOUNTER — Ambulatory Visit: Payer: Self-pay | Admitting: Internal Medicine

## 2007-11-20 ENCOUNTER — Ambulatory Visit: Payer: Self-pay | Admitting: Internal Medicine

## 2007-11-23 ENCOUNTER — Encounter: Admission: RE | Admit: 2007-11-23 | Discharge: 2008-02-21 | Payer: Self-pay | Admitting: Internal Medicine

## 2007-11-24 ENCOUNTER — Encounter: Payer: Self-pay | Admitting: Family Medicine

## 2007-11-24 ENCOUNTER — Ambulatory Visit: Payer: Self-pay | Admitting: Internal Medicine

## 2007-11-26 ENCOUNTER — Ambulatory Visit: Payer: Self-pay | Admitting: Internal Medicine

## 2007-12-01 ENCOUNTER — Ambulatory Visit: Payer: Self-pay | Admitting: Internal Medicine

## 2007-12-04 ENCOUNTER — Ambulatory Visit: Payer: Self-pay | Admitting: Internal Medicine

## 2007-12-08 ENCOUNTER — Ambulatory Visit: Payer: Self-pay | Admitting: Internal Medicine

## 2007-12-10 ENCOUNTER — Ambulatory Visit: Payer: Self-pay | Admitting: Internal Medicine

## 2007-12-14 ENCOUNTER — Ambulatory Visit: Payer: Self-pay | Admitting: Internal Medicine

## 2007-12-17 ENCOUNTER — Ambulatory Visit: Payer: Self-pay | Admitting: Family Medicine

## 2007-12-17 ENCOUNTER — Ambulatory Visit: Payer: Self-pay | Admitting: Internal Medicine

## 2007-12-18 ENCOUNTER — Ambulatory Visit: Payer: Self-pay | Admitting: Internal Medicine

## 2007-12-22 ENCOUNTER — Ambulatory Visit: Payer: Self-pay | Admitting: Internal Medicine

## 2007-12-25 ENCOUNTER — Ambulatory Visit: Payer: Self-pay | Admitting: Internal Medicine

## 2007-12-29 ENCOUNTER — Encounter: Payer: Self-pay | Admitting: Pulmonary Disease

## 2007-12-29 ENCOUNTER — Ambulatory Visit: Payer: Self-pay | Admitting: Internal Medicine

## 2007-12-31 ENCOUNTER — Ambulatory Visit: Payer: Self-pay | Admitting: Internal Medicine

## 2008-01-01 ENCOUNTER — Telehealth: Payer: Self-pay | Admitting: *Deleted

## 2008-01-05 ENCOUNTER — Ambulatory Visit: Payer: Self-pay | Admitting: Internal Medicine

## 2008-01-08 ENCOUNTER — Ambulatory Visit: Payer: Self-pay | Admitting: Internal Medicine

## 2008-01-12 ENCOUNTER — Ambulatory Visit: Payer: Self-pay | Admitting: Internal Medicine

## 2008-01-15 ENCOUNTER — Ambulatory Visit: Payer: Self-pay | Admitting: Internal Medicine

## 2008-01-19 ENCOUNTER — Ambulatory Visit: Payer: Self-pay | Admitting: Internal Medicine

## 2008-01-21 ENCOUNTER — Ambulatory Visit: Payer: Self-pay | Admitting: Family Medicine

## 2008-01-21 ENCOUNTER — Ambulatory Visit: Payer: Self-pay | Admitting: Internal Medicine

## 2008-01-21 ENCOUNTER — Encounter (INDEPENDENT_AMBULATORY_CARE_PROVIDER_SITE_OTHER): Payer: Self-pay

## 2008-01-21 LAB — CONVERTED CEMR LAB: Blood Glucose, Fingerstick: 103

## 2008-01-26 ENCOUNTER — Ambulatory Visit: Payer: Self-pay | Admitting: Internal Medicine

## 2008-01-29 ENCOUNTER — Ambulatory Visit: Payer: Self-pay | Admitting: Internal Medicine

## 2008-02-02 ENCOUNTER — Ambulatory Visit: Payer: Self-pay | Admitting: Internal Medicine

## 2008-02-09 ENCOUNTER — Ambulatory Visit: Payer: Self-pay | Admitting: Internal Medicine

## 2008-02-16 ENCOUNTER — Ambulatory Visit: Payer: Self-pay | Admitting: Internal Medicine

## 2008-02-23 ENCOUNTER — Ambulatory Visit: Payer: Self-pay | Admitting: Internal Medicine

## 2008-02-24 ENCOUNTER — Ambulatory Visit: Payer: Self-pay | Admitting: Internal Medicine

## 2008-02-26 ENCOUNTER — Encounter: Payer: Self-pay | Admitting: Internal Medicine

## 2008-03-01 ENCOUNTER — Ambulatory Visit: Payer: Self-pay | Admitting: Internal Medicine

## 2008-03-01 ENCOUNTER — Ambulatory Visit: Payer: Self-pay | Admitting: Family Medicine

## 2008-03-02 ENCOUNTER — Telehealth: Payer: Self-pay | Admitting: *Deleted

## 2008-03-02 ENCOUNTER — Ambulatory Visit: Payer: Self-pay | Admitting: Family Medicine

## 2008-03-02 DIAGNOSIS — H903 Sensorineural hearing loss, bilateral: Secondary | ICD-10-CM | POA: Insufficient documentation

## 2008-03-08 ENCOUNTER — Ambulatory Visit: Payer: Self-pay | Admitting: Internal Medicine

## 2008-03-14 ENCOUNTER — Ambulatory Visit: Payer: Self-pay | Admitting: Internal Medicine

## 2008-03-16 ENCOUNTER — Encounter: Payer: Self-pay | Admitting: Internal Medicine

## 2008-03-16 ENCOUNTER — Ambulatory Visit (HOSPITAL_COMMUNITY): Admission: RE | Admit: 2008-03-16 | Discharge: 2008-03-16 | Payer: Self-pay | Admitting: Internal Medicine

## 2008-03-21 ENCOUNTER — Ambulatory Visit: Payer: Self-pay | Admitting: Internal Medicine

## 2008-03-29 ENCOUNTER — Ambulatory Visit: Payer: Self-pay | Admitting: Internal Medicine

## 2008-04-05 ENCOUNTER — Ambulatory Visit: Payer: Self-pay | Admitting: Internal Medicine

## 2008-04-13 ENCOUNTER — Encounter: Admission: RE | Admit: 2008-04-13 | Discharge: 2008-04-13 | Payer: Self-pay | Admitting: Family Medicine

## 2008-04-14 ENCOUNTER — Ambulatory Visit: Payer: Self-pay | Admitting: Internal Medicine

## 2008-04-19 ENCOUNTER — Ambulatory Visit: Payer: Self-pay | Admitting: Internal Medicine

## 2008-04-19 ENCOUNTER — Encounter: Payer: Self-pay | Admitting: Family Medicine

## 2008-04-20 ENCOUNTER — Encounter: Payer: Self-pay | Admitting: Internal Medicine

## 2008-04-21 ENCOUNTER — Ambulatory Visit: Payer: Self-pay | Admitting: Internal Medicine

## 2008-04-26 ENCOUNTER — Ambulatory Visit: Payer: Self-pay | Admitting: Internal Medicine

## 2008-04-28 ENCOUNTER — Encounter (INDEPENDENT_AMBULATORY_CARE_PROVIDER_SITE_OTHER): Payer: Self-pay | Admitting: Pharmacist

## 2008-04-28 ENCOUNTER — Ambulatory Visit: Payer: Self-pay | Admitting: Family Medicine

## 2008-04-28 LAB — CONVERTED CEMR LAB
ALT: 41 units/L — ABNORMAL HIGH (ref 0–35)
AST: 37 units/L (ref 0–37)
Albumin: 4.3 g/dL (ref 3.5–5.2)
Alkaline Phosphatase: 79 units/L (ref 39–117)
BUN: 15 mg/dL (ref 6–23)
CO2: 27 meq/L (ref 19–32)
Calcium: 10 mg/dL (ref 8.4–10.5)
Chloride: 104 meq/L (ref 96–112)
Creatinine, Ser: 0.75 mg/dL (ref 0.40–1.20)
Glucose, Bld: 139 mg/dL — ABNORMAL HIGH (ref 70–99)
Potassium: 4 meq/L (ref 3.5–5.3)
Sodium: 144 meq/L (ref 135–145)
Total Bilirubin: 0.2 mg/dL — ABNORMAL LOW (ref 0.3–1.2)
Total Protein: 7.3 g/dL (ref 6.0–8.3)

## 2008-05-03 ENCOUNTER — Encounter: Payer: Self-pay | Admitting: Family Medicine

## 2008-05-03 ENCOUNTER — Ambulatory Visit: Payer: Self-pay | Admitting: Internal Medicine

## 2008-05-10 ENCOUNTER — Ambulatory Visit: Payer: Self-pay | Admitting: Internal Medicine

## 2008-05-23 ENCOUNTER — Ambulatory Visit: Payer: Self-pay | Admitting: Pulmonary Disease

## 2008-05-23 ENCOUNTER — Ambulatory Visit: Payer: Self-pay | Admitting: Internal Medicine

## 2008-05-31 ENCOUNTER — Ambulatory Visit: Payer: Self-pay | Admitting: Internal Medicine

## 2008-06-02 ENCOUNTER — Ambulatory Visit: Payer: Self-pay | Admitting: Internal Medicine

## 2008-06-07 ENCOUNTER — Ambulatory Visit: Payer: Self-pay | Admitting: Internal Medicine

## 2008-06-08 ENCOUNTER — Ambulatory Visit: Payer: Self-pay | Admitting: Family Medicine

## 2008-06-08 DIAGNOSIS — G63 Polyneuropathy in diseases classified elsewhere: Secondary | ICD-10-CM | POA: Insufficient documentation

## 2008-06-09 ENCOUNTER — Telehealth: Payer: Self-pay | Admitting: Family Medicine

## 2008-06-13 ENCOUNTER — Encounter: Admission: RE | Admit: 2008-06-13 | Discharge: 2008-06-13 | Payer: Self-pay | Admitting: Family Medicine

## 2008-06-14 ENCOUNTER — Ambulatory Visit: Payer: Self-pay | Admitting: Internal Medicine

## 2008-06-14 ENCOUNTER — Encounter: Payer: Self-pay | Admitting: Family Medicine

## 2008-06-21 ENCOUNTER — Ambulatory Visit: Payer: Self-pay | Admitting: Internal Medicine

## 2008-06-27 ENCOUNTER — Ambulatory Visit: Payer: Self-pay | Admitting: Internal Medicine

## 2008-06-28 ENCOUNTER — Ambulatory Visit: Payer: Self-pay | Admitting: Internal Medicine

## 2008-06-28 ENCOUNTER — Telehealth (INDEPENDENT_AMBULATORY_CARE_PROVIDER_SITE_OTHER): Payer: Self-pay | Admitting: *Deleted

## 2008-07-07 ENCOUNTER — Ambulatory Visit: Payer: Self-pay | Admitting: Family Medicine

## 2008-07-07 ENCOUNTER — Ambulatory Visit: Payer: Self-pay | Admitting: Internal Medicine

## 2008-07-14 ENCOUNTER — Ambulatory Visit: Payer: Self-pay | Admitting: Internal Medicine

## 2008-07-18 ENCOUNTER — Telehealth: Payer: Self-pay | Admitting: *Deleted

## 2008-07-19 ENCOUNTER — Ambulatory Visit: Payer: Self-pay | Admitting: Internal Medicine

## 2008-07-22 ENCOUNTER — Encounter: Payer: Self-pay | Admitting: Family Medicine

## 2008-07-27 ENCOUNTER — Ambulatory Visit: Payer: Self-pay | Admitting: Internal Medicine

## 2008-08-02 ENCOUNTER — Ambulatory Visit: Payer: Self-pay | Admitting: Internal Medicine

## 2008-08-08 ENCOUNTER — Ambulatory Visit: Payer: Self-pay | Admitting: Internal Medicine

## 2008-08-11 ENCOUNTER — Ambulatory Visit: Payer: Self-pay | Admitting: Family Medicine

## 2008-08-11 DIAGNOSIS — J1089 Influenza due to other identified influenza virus with other manifestations: Secondary | ICD-10-CM | POA: Insufficient documentation

## 2008-08-12 ENCOUNTER — Ambulatory Visit: Payer: Self-pay | Admitting: Internal Medicine

## 2008-08-16 ENCOUNTER — Ambulatory Visit: Payer: Self-pay | Admitting: Internal Medicine

## 2008-08-23 ENCOUNTER — Ambulatory Visit: Payer: Self-pay | Admitting: Internal Medicine

## 2008-08-30 ENCOUNTER — Encounter: Payer: Self-pay | Admitting: Family Medicine

## 2008-08-30 ENCOUNTER — Ambulatory Visit: Payer: Self-pay | Admitting: Internal Medicine

## 2008-09-06 ENCOUNTER — Ambulatory Visit: Payer: Self-pay | Admitting: Internal Medicine

## 2008-09-07 ENCOUNTER — Ambulatory Visit: Payer: Self-pay | Admitting: Internal Medicine

## 2008-09-13 ENCOUNTER — Ambulatory Visit: Payer: Self-pay | Admitting: Internal Medicine

## 2008-09-14 ENCOUNTER — Ambulatory Visit: Payer: Self-pay | Admitting: Family Medicine

## 2008-09-14 DIAGNOSIS — B009 Herpesviral infection, unspecified: Secondary | ICD-10-CM | POA: Insufficient documentation

## 2008-09-14 LAB — CONVERTED CEMR LAB
ALT: 34 units/L (ref 0–35)
AST: 36 units/L (ref 0–37)
Albumin: 4.1 g/dL (ref 3.5–5.2)
Alkaline Phosphatase: 78 units/L (ref 39–117)
BUN: 14 mg/dL (ref 6–23)
CO2: 27 meq/L (ref 19–32)
Calcium: 9.5 mg/dL (ref 8.4–10.5)
Chloride: 104 meq/L (ref 96–112)
Creatinine, Ser: 0.71 mg/dL (ref 0.40–1.20)
Glucose, Bld: 105 mg/dL — ABNORMAL HIGH (ref 70–99)
Hgb A1c MFr Bld: 6.6 %
Potassium: 4.2 meq/L (ref 3.5–5.3)
Sodium: 142 meq/L (ref 135–145)
Total Bilirubin: 0.3 mg/dL (ref 0.3–1.2)
Total Protein: 7 g/dL (ref 6.0–8.3)

## 2008-09-15 ENCOUNTER — Encounter: Payer: Self-pay | Admitting: Family Medicine

## 2008-09-20 ENCOUNTER — Ambulatory Visit: Payer: Self-pay | Admitting: Internal Medicine

## 2008-09-20 ENCOUNTER — Telehealth (INDEPENDENT_AMBULATORY_CARE_PROVIDER_SITE_OTHER): Payer: Self-pay | Admitting: *Deleted

## 2008-09-26 ENCOUNTER — Telehealth: Payer: Self-pay | Admitting: *Deleted

## 2008-09-29 ENCOUNTER — Ambulatory Visit: Payer: Self-pay | Admitting: Internal Medicine

## 2008-10-04 ENCOUNTER — Telehealth (INDEPENDENT_AMBULATORY_CARE_PROVIDER_SITE_OTHER): Payer: Self-pay | Admitting: *Deleted

## 2008-10-06 ENCOUNTER — Ambulatory Visit: Payer: Self-pay | Admitting: Internal Medicine

## 2008-10-10 ENCOUNTER — Telehealth (INDEPENDENT_AMBULATORY_CARE_PROVIDER_SITE_OTHER): Payer: Self-pay | Admitting: *Deleted

## 2008-10-12 ENCOUNTER — Encounter: Payer: Self-pay | Admitting: Internal Medicine

## 2008-10-13 ENCOUNTER — Ambulatory Visit: Payer: Self-pay | Admitting: Internal Medicine

## 2008-10-18 ENCOUNTER — Ambulatory Visit: Payer: Self-pay | Admitting: Internal Medicine

## 2008-10-25 ENCOUNTER — Ambulatory Visit: Payer: Self-pay | Admitting: Internal Medicine

## 2008-10-25 ENCOUNTER — Telehealth: Payer: Self-pay | Admitting: Internal Medicine

## 2008-10-27 ENCOUNTER — Ambulatory Visit: Payer: Self-pay | Admitting: Family Medicine

## 2008-11-03 ENCOUNTER — Ambulatory Visit: Payer: Self-pay | Admitting: Internal Medicine

## 2008-11-10 ENCOUNTER — Ambulatory Visit: Payer: Self-pay | Admitting: Internal Medicine

## 2008-11-15 ENCOUNTER — Ambulatory Visit: Payer: Self-pay | Admitting: Internal Medicine

## 2008-11-16 ENCOUNTER — Ambulatory Visit: Payer: Self-pay | Admitting: Family Medicine

## 2008-11-18 ENCOUNTER — Encounter: Payer: Self-pay | Admitting: Family Medicine

## 2008-11-24 ENCOUNTER — Ambulatory Visit: Payer: Self-pay | Admitting: Internal Medicine

## 2008-11-29 ENCOUNTER — Ambulatory Visit: Payer: Self-pay | Admitting: Internal Medicine

## 2008-12-06 ENCOUNTER — Ambulatory Visit: Payer: Self-pay | Admitting: Internal Medicine

## 2008-12-13 ENCOUNTER — Telehealth: Payer: Self-pay | Admitting: Family Medicine

## 2008-12-13 ENCOUNTER — Ambulatory Visit: Payer: Self-pay | Admitting: Internal Medicine

## 2008-12-13 ENCOUNTER — Encounter: Payer: Self-pay | Admitting: Family Medicine

## 2008-12-20 ENCOUNTER — Ambulatory Visit: Payer: Self-pay | Admitting: Internal Medicine

## 2008-12-27 ENCOUNTER — Ambulatory Visit: Payer: Self-pay | Admitting: Internal Medicine

## 2009-01-03 ENCOUNTER — Ambulatory Visit: Payer: Self-pay | Admitting: Internal Medicine

## 2009-01-04 ENCOUNTER — Telehealth: Payer: Self-pay | Admitting: Family Medicine

## 2009-01-04 ENCOUNTER — Telehealth (INDEPENDENT_AMBULATORY_CARE_PROVIDER_SITE_OTHER): Payer: Self-pay | Admitting: *Deleted

## 2009-01-04 ENCOUNTER — Ambulatory Visit: Payer: Self-pay | Admitting: Family Medicine

## 2009-01-04 LAB — CONVERTED CEMR LAB
Cholesterol: 93 mg/dL (ref 0–200)
HDL: 30 mg/dL — ABNORMAL LOW (ref >39)
LDL Cholesterol: 47 mg/dL (ref 0–99)
TSH: 3.23 microintl units/mL (ref 0.350–4.500)
Total CHOL/HDL Ratio: 3.1
Triglycerides: 81 mg/dL (ref <150)
VLDL: 16 mg/dL (ref 0–40)

## 2009-01-06 ENCOUNTER — Encounter: Payer: Self-pay | Admitting: Family Medicine

## 2009-01-12 ENCOUNTER — Ambulatory Visit: Payer: Self-pay | Admitting: Internal Medicine

## 2009-01-19 ENCOUNTER — Ambulatory Visit: Payer: Self-pay | Admitting: Internal Medicine

## 2009-01-26 ENCOUNTER — Ambulatory Visit: Payer: Self-pay | Admitting: Internal Medicine

## 2009-02-01 ENCOUNTER — Telehealth: Payer: Self-pay | Admitting: *Deleted

## 2009-02-01 ENCOUNTER — Encounter: Payer: Self-pay | Admitting: Family Medicine

## 2009-02-03 ENCOUNTER — Ambulatory Visit: Payer: Self-pay | Admitting: Internal Medicine

## 2009-02-06 ENCOUNTER — Encounter: Payer: Self-pay | Admitting: *Deleted

## 2009-02-08 ENCOUNTER — Ambulatory Visit: Payer: Self-pay | Admitting: Internal Medicine

## 2009-02-16 ENCOUNTER — Ambulatory Visit: Payer: Self-pay | Admitting: Internal Medicine

## 2009-02-23 ENCOUNTER — Ambulatory Visit: Payer: Self-pay | Admitting: Internal Medicine

## 2009-02-28 ENCOUNTER — Encounter: Payer: Self-pay | Admitting: Family Medicine

## 2009-03-01 ENCOUNTER — Encounter: Payer: Self-pay | Admitting: Internal Medicine

## 2009-03-02 ENCOUNTER — Ambulatory Visit: Payer: Self-pay | Admitting: Internal Medicine

## 2009-03-09 ENCOUNTER — Ambulatory Visit: Payer: Self-pay | Admitting: Internal Medicine

## 2009-03-09 ENCOUNTER — Ambulatory Visit: Payer: Self-pay | Admitting: Family Medicine

## 2009-03-09 LAB — CONVERTED CEMR LAB: Hgb A1c MFr Bld: 6.6 %

## 2009-03-16 ENCOUNTER — Ambulatory Visit: Payer: Self-pay | Admitting: Internal Medicine

## 2009-03-23 ENCOUNTER — Ambulatory Visit: Payer: Self-pay | Admitting: Internal Medicine

## 2009-03-30 ENCOUNTER — Ambulatory Visit: Payer: Self-pay | Admitting: Internal Medicine

## 2009-03-31 ENCOUNTER — Telehealth (INDEPENDENT_AMBULATORY_CARE_PROVIDER_SITE_OTHER): Payer: Self-pay | Admitting: *Deleted

## 2009-04-03 ENCOUNTER — Telehealth (INDEPENDENT_AMBULATORY_CARE_PROVIDER_SITE_OTHER): Payer: Self-pay | Admitting: *Deleted

## 2009-04-05 ENCOUNTER — Telehealth: Payer: Self-pay | Admitting: Internal Medicine

## 2009-04-06 ENCOUNTER — Ambulatory Visit: Payer: Self-pay | Admitting: Internal Medicine

## 2009-04-07 ENCOUNTER — Ambulatory Visit: Payer: Self-pay | Admitting: Family Medicine

## 2009-04-08 ENCOUNTER — Encounter: Payer: Self-pay | Admitting: Internal Medicine

## 2009-04-13 ENCOUNTER — Ambulatory Visit: Payer: Self-pay | Admitting: Internal Medicine

## 2009-04-14 ENCOUNTER — Encounter: Admission: RE | Admit: 2009-04-14 | Discharge: 2009-04-14 | Payer: Self-pay | Admitting: Family Medicine

## 2009-04-17 ENCOUNTER — Encounter: Payer: Self-pay | Admitting: Family Medicine

## 2009-04-18 ENCOUNTER — Encounter: Payer: Self-pay | Admitting: Internal Medicine

## 2009-04-20 ENCOUNTER — Ambulatory Visit: Payer: Self-pay | Admitting: Internal Medicine

## 2009-04-26 ENCOUNTER — Telehealth: Payer: Self-pay | Admitting: *Deleted

## 2009-04-27 ENCOUNTER — Ambulatory Visit: Payer: Self-pay | Admitting: Internal Medicine

## 2009-04-28 ENCOUNTER — Ambulatory Visit: Payer: Self-pay | Admitting: Family Medicine

## 2009-04-28 ENCOUNTER — Encounter: Payer: Self-pay | Admitting: Family Medicine

## 2009-04-28 LAB — CONVERTED CEMR LAB: Uric Acid, Serum: 7.9 mg/dL — ABNORMAL HIGH (ref 2.4–7.0)

## 2009-05-01 ENCOUNTER — Telehealth: Payer: Self-pay | Admitting: Family Medicine

## 2009-05-01 ENCOUNTER — Ambulatory Visit: Payer: Self-pay | Admitting: Internal Medicine

## 2009-05-01 DIAGNOSIS — E785 Hyperlipidemia, unspecified: Secondary | ICD-10-CM | POA: Insufficient documentation

## 2009-05-01 LAB — CONVERTED CEMR LAB
AST: 31 units/L (ref 0–37)
BUN: 11 mg/dL (ref 6–23)
CO2: 32 meq/L (ref 19–32)
Calcium: 9.2 mg/dL (ref 8.4–10.5)
Chloride: 109 meq/L (ref 96–112)
Creatinine, Ser: 1 mg/dL (ref 0.4–1.2)
GFR calc non Af Amer: 73.5 mL/min (ref >60)
Glucose, Bld: 96 mg/dL (ref 70–99)
Potassium: 3.3 meq/L — ABNORMAL LOW (ref 3.5–5.1)
Sodium: 147 meq/L — ABNORMAL HIGH (ref 135–145)
Uric Acid, Serum: 7.1 mg/dL — ABNORMAL HIGH (ref 2.4–7.0)

## 2009-05-04 ENCOUNTER — Ambulatory Visit: Payer: Self-pay | Admitting: Internal Medicine

## 2009-05-05 ENCOUNTER — Ambulatory Visit: Payer: Self-pay | Admitting: Internal Medicine

## 2009-05-11 ENCOUNTER — Ambulatory Visit: Payer: Self-pay | Admitting: Internal Medicine

## 2009-05-16 ENCOUNTER — Ambulatory Visit: Payer: Self-pay | Admitting: Internal Medicine

## 2009-05-17 LAB — CONVERTED CEMR LAB
BUN: 12 mg/dL (ref 6–23)
CO2: 33 meq/L — ABNORMAL HIGH (ref 19–32)
Calcium: 9.7 mg/dL (ref 8.4–10.5)
Chloride: 105 meq/L (ref 96–112)
Creatinine, Ser: 1 mg/dL (ref 0.4–1.2)
GFR calc non Af Amer: 73.49 mL/min (ref >60)
Glucose, Bld: 167 mg/dL — ABNORMAL HIGH (ref 70–99)
Magnesium: 2.1 mg/dL (ref 1.5–2.5)
Potassium: 3.3 meq/L — ABNORMAL LOW (ref 3.5–5.1)
Sodium: 146 meq/L — ABNORMAL HIGH (ref 135–145)

## 2009-05-18 ENCOUNTER — Ambulatory Visit: Payer: Self-pay | Admitting: Internal Medicine

## 2009-05-19 ENCOUNTER — Encounter: Payer: Self-pay | Admitting: Family Medicine

## 2009-05-22 ENCOUNTER — Telehealth (INDEPENDENT_AMBULATORY_CARE_PROVIDER_SITE_OTHER): Payer: Self-pay | Admitting: Radiology

## 2009-05-23 ENCOUNTER — Ambulatory Visit: Payer: Self-pay | Admitting: Internal Medicine

## 2009-05-23 ENCOUNTER — Ambulatory Visit: Payer: Self-pay

## 2009-05-23 ENCOUNTER — Encounter: Payer: Self-pay | Admitting: Internal Medicine

## 2009-05-24 ENCOUNTER — Ambulatory Visit: Payer: Self-pay | Admitting: Family Medicine

## 2009-05-25 ENCOUNTER — Telehealth: Payer: Self-pay | Admitting: Internal Medicine

## 2009-05-25 LAB — CONVERTED CEMR LAB
BUN: 19 mg/dL (ref 6–23)
CO2: 31 meq/L (ref 19–32)
Calcium: 9.7 mg/dL (ref 8.4–10.5)
Chloride: 103 meq/L (ref 96–112)
Creatinine, Ser: 0.8 mg/dL (ref 0.4–1.2)
GFR calc non Af Amer: 95.07 mL/min (ref >60)
Glucose, Bld: 109 mg/dL — ABNORMAL HIGH (ref 70–99)
Potassium: 3.9 meq/L (ref 3.5–5.1)
Sodium: 143 meq/L (ref 135–145)

## 2009-05-26 ENCOUNTER — Ambulatory Visit: Payer: Self-pay | Admitting: Internal Medicine

## 2009-05-31 ENCOUNTER — Encounter: Payer: Self-pay | Admitting: Internal Medicine

## 2009-06-02 ENCOUNTER — Ambulatory Visit: Payer: Self-pay | Admitting: Internal Medicine

## 2009-06-08 ENCOUNTER — Ambulatory Visit: Payer: Self-pay | Admitting: Internal Medicine

## 2009-06-15 ENCOUNTER — Ambulatory Visit: Payer: Self-pay | Admitting: Internal Medicine

## 2009-06-22 ENCOUNTER — Ambulatory Visit: Payer: Self-pay | Admitting: Internal Medicine

## 2009-06-28 ENCOUNTER — Ambulatory Visit: Payer: Self-pay | Admitting: Family Medicine

## 2009-06-28 ENCOUNTER — Telehealth: Payer: Self-pay | Admitting: *Deleted

## 2009-06-28 LAB — CONVERTED CEMR LAB: Hgb A1c MFr Bld: 6.4 %

## 2009-06-29 ENCOUNTER — Ambulatory Visit: Payer: Self-pay | Admitting: Internal Medicine

## 2009-06-29 ENCOUNTER — Telehealth (INDEPENDENT_AMBULATORY_CARE_PROVIDER_SITE_OTHER): Payer: Self-pay | Admitting: *Deleted

## 2009-07-05 ENCOUNTER — Encounter: Payer: Self-pay | Admitting: Family Medicine

## 2009-07-06 ENCOUNTER — Ambulatory Visit: Payer: Self-pay | Admitting: Internal Medicine

## 2009-07-13 ENCOUNTER — Ambulatory Visit: Payer: Self-pay | Admitting: Internal Medicine

## 2009-07-20 ENCOUNTER — Ambulatory Visit: Payer: Self-pay | Admitting: Internal Medicine

## 2009-07-26 ENCOUNTER — Ambulatory Visit: Payer: Self-pay | Admitting: Family Medicine

## 2009-07-26 ENCOUNTER — Telehealth: Payer: Self-pay | Admitting: *Deleted

## 2009-07-26 ENCOUNTER — Ambulatory Visit: Payer: Self-pay | Admitting: Internal Medicine

## 2009-08-03 ENCOUNTER — Ambulatory Visit: Payer: Self-pay | Admitting: Internal Medicine

## 2009-08-07 ENCOUNTER — Ambulatory Visit: Payer: Self-pay | Admitting: Internal Medicine

## 2009-08-14 ENCOUNTER — Ambulatory Visit: Payer: Self-pay | Admitting: Family Medicine

## 2009-08-18 ENCOUNTER — Ambulatory Visit: Payer: Self-pay | Admitting: Internal Medicine

## 2009-08-21 ENCOUNTER — Encounter: Payer: Self-pay | Admitting: Family Medicine

## 2009-08-24 ENCOUNTER — Ambulatory Visit: Payer: Self-pay | Admitting: Internal Medicine

## 2009-08-31 ENCOUNTER — Ambulatory Visit: Payer: Self-pay | Admitting: Internal Medicine

## 2009-09-04 ENCOUNTER — Encounter: Payer: Self-pay | Admitting: Family Medicine

## 2009-09-05 ENCOUNTER — Encounter: Payer: Self-pay | Admitting: Internal Medicine

## 2009-09-07 ENCOUNTER — Ambulatory Visit: Payer: Self-pay | Admitting: Internal Medicine

## 2009-09-08 ENCOUNTER — Ambulatory Visit: Payer: Self-pay | Admitting: Internal Medicine

## 2009-09-14 ENCOUNTER — Ambulatory Visit: Payer: Self-pay | Admitting: Internal Medicine

## 2009-09-20 ENCOUNTER — Telehealth (INDEPENDENT_AMBULATORY_CARE_PROVIDER_SITE_OTHER): Payer: Self-pay | Admitting: *Deleted

## 2009-09-21 ENCOUNTER — Ambulatory Visit: Payer: Self-pay | Admitting: Internal Medicine

## 2009-09-22 ENCOUNTER — Telehealth: Payer: Self-pay | Admitting: Internal Medicine

## 2009-09-27 ENCOUNTER — Telehealth: Payer: Self-pay | Admitting: *Deleted

## 2009-09-28 ENCOUNTER — Ambulatory Visit: Payer: Self-pay | Admitting: Internal Medicine

## 2009-10-06 ENCOUNTER — Ambulatory Visit: Payer: Self-pay | Admitting: Internal Medicine

## 2009-10-11 ENCOUNTER — Ambulatory Visit: Payer: Self-pay | Admitting: Family Medicine

## 2009-10-11 LAB — CONVERTED CEMR LAB
BUN: 11 mg/dL (ref 6–23)
CO2: 27 meq/L (ref 19–32)
Calcium: 9.5 mg/dL (ref 8.4–10.5)
Chloride: 105 meq/L (ref 96–112)
Creatinine, Ser: 0.73 mg/dL (ref 0.40–1.20)
Glucose, Bld: 118 mg/dL — ABNORMAL HIGH (ref 70–99)
Hgb A1c MFr Bld: 6.7 %
Potassium: 3.9 meq/L (ref 3.5–5.3)
Sodium: 143 meq/L (ref 135–145)
TSH: 3.081 microintl units/mL (ref 0.350–4.500)

## 2009-10-12 ENCOUNTER — Ambulatory Visit: Payer: Self-pay | Admitting: Internal Medicine

## 2009-10-12 ENCOUNTER — Encounter: Payer: Self-pay | Admitting: Family Medicine

## 2009-10-19 ENCOUNTER — Ambulatory Visit: Payer: Self-pay | Admitting: Internal Medicine

## 2009-10-19 ENCOUNTER — Telehealth: Payer: Self-pay | Admitting: *Deleted

## 2009-10-26 ENCOUNTER — Ambulatory Visit: Payer: Self-pay | Admitting: Internal Medicine

## 2009-11-03 ENCOUNTER — Ambulatory Visit: Payer: Self-pay | Admitting: Internal Medicine

## 2009-11-03 ENCOUNTER — Telehealth (INDEPENDENT_AMBULATORY_CARE_PROVIDER_SITE_OTHER): Payer: Self-pay | Admitting: *Deleted

## 2009-11-09 ENCOUNTER — Ambulatory Visit: Payer: Self-pay | Admitting: Internal Medicine

## 2009-11-10 ENCOUNTER — Ambulatory Visit: Payer: Self-pay | Admitting: Family Medicine

## 2009-11-10 LAB — CONVERTED CEMR LAB
Bilirubin Urine: NEGATIVE
Glucose, Urine, Semiquant: 100
Ketones, urine, test strip: NEGATIVE
Nitrite: NEGATIVE
Protein, U semiquant: NEGATIVE
Specific Gravity, Urine: 1.015
Urobilinogen, UA: 1
WBC, UA: >20 cells/hpf
pH: 6

## 2009-11-16 ENCOUNTER — Ambulatory Visit: Payer: Self-pay | Admitting: Internal Medicine

## 2009-11-23 ENCOUNTER — Ambulatory Visit: Payer: Self-pay | Admitting: Internal Medicine

## 2009-11-30 ENCOUNTER — Ambulatory Visit: Payer: Self-pay | Admitting: Internal Medicine

## 2009-11-30 ENCOUNTER — Encounter: Payer: Self-pay | Admitting: Family Medicine

## 2009-12-05 ENCOUNTER — Ambulatory Visit: Payer: Self-pay | Admitting: Internal Medicine

## 2009-12-06 ENCOUNTER — Telehealth: Payer: Self-pay | Admitting: Family Medicine

## 2009-12-12 ENCOUNTER — Telehealth: Payer: Self-pay | Admitting: *Deleted

## 2009-12-13 ENCOUNTER — Encounter: Admission: RE | Admit: 2009-12-13 | Discharge: 2009-12-13 | Payer: Self-pay | Admitting: Gastroenterology

## 2009-12-13 ENCOUNTER — Telehealth (INDEPENDENT_AMBULATORY_CARE_PROVIDER_SITE_OTHER): Payer: Self-pay | Admitting: Family Medicine

## 2009-12-14 ENCOUNTER — Ambulatory Visit: Payer: Self-pay | Admitting: Internal Medicine

## 2009-12-21 ENCOUNTER — Ambulatory Visit: Payer: Self-pay | Admitting: Internal Medicine

## 2009-12-28 ENCOUNTER — Ambulatory Visit (HOSPITAL_COMMUNITY)
Admission: RE | Admit: 2009-12-28 | Discharge: 2009-12-28 | Payer: Self-pay | Source: Home / Self Care | Admitting: Family Medicine

## 2009-12-28 ENCOUNTER — Ambulatory Visit: Payer: Self-pay | Admitting: Family Medicine

## 2009-12-28 DIAGNOSIS — G5600 Carpal tunnel syndrome, unspecified upper limb: Secondary | ICD-10-CM | POA: Insufficient documentation

## 2009-12-28 LAB — CONVERTED CEMR LAB
ALT: 54 units/L — ABNORMAL HIGH (ref 0–35)
AST: 64 units/L — ABNORMAL HIGH (ref 0–37)
Albumin: 4.3 g/dL (ref 3.5–5.2)
Alkaline Phosphatase: 81 units/L (ref 39–117)
BUN: 12 mg/dL (ref 6–23)
Bilirubin, Direct: 0.1 mg/dL (ref 0.0–0.3)
CO2: 30 meq/L (ref 19–32)
Calcium: 9.6 mg/dL (ref 8.4–10.5)
Chloride: 103 meq/L (ref 96–112)
Creatinine, Ser: 0.86 mg/dL (ref 0.40–1.20)
Glucose, Bld: 152 mg/dL — ABNORMAL HIGH (ref 70–99)
HCT: 37.4 % (ref 36.0–46.0)
Hemoglobin: 11.2 g/dL — ABNORMAL LOW (ref 12.0–15.0)
Indirect Bilirubin: 0.3 mg/dL (ref 0.0–0.9)
MCHC: 29.9 g/dL — ABNORMAL LOW (ref 30.0–36.0)
MCV: 91 fL (ref 78.0–100.0)
Platelets: 256 10*3/uL (ref 150–400)
Potassium: 3.8 meq/L (ref 3.5–5.3)
RBC: 4.11 M/uL (ref 3.87–5.11)
RDW: 15.1 % (ref 11.5–15.5)
Sodium: 141 meq/L (ref 135–145)
TSH: 3.524 microintl units/mL (ref 0.350–4.500)
Total Bilirubin: 0.4 mg/dL (ref 0.3–1.2)
Total Protein: 7.3 g/dL (ref 6.0–8.3)
WBC: 9.2 10*3/uL (ref 4.0–10.5)

## 2009-12-29 ENCOUNTER — Encounter (INDEPENDENT_AMBULATORY_CARE_PROVIDER_SITE_OTHER): Payer: Self-pay | Admitting: Family Medicine

## 2009-12-29 ENCOUNTER — Ambulatory Visit: Payer: Self-pay | Admitting: Internal Medicine

## 2009-12-29 ENCOUNTER — Telehealth: Payer: Self-pay | Admitting: Family Medicine

## 2010-01-01 ENCOUNTER — Telehealth: Payer: Self-pay | Admitting: Family Medicine

## 2010-01-04 ENCOUNTER — Telehealth (INDEPENDENT_AMBULATORY_CARE_PROVIDER_SITE_OTHER): Payer: Self-pay | Admitting: *Deleted

## 2010-01-04 ENCOUNTER — Ambulatory Visit: Payer: Self-pay | Admitting: Family Medicine

## 2010-01-05 ENCOUNTER — Ambulatory Visit: Payer: Self-pay | Admitting: Internal Medicine

## 2010-01-08 ENCOUNTER — Ambulatory Visit: Payer: Self-pay | Admitting: Internal Medicine

## 2010-01-11 ENCOUNTER — Telehealth: Payer: Self-pay | Admitting: *Deleted

## 2010-01-12 ENCOUNTER — Ambulatory Visit: Payer: Self-pay | Admitting: Internal Medicine

## 2010-01-17 ENCOUNTER — Ambulatory Visit: Payer: Self-pay | Admitting: Family Medicine

## 2010-01-17 LAB — CONVERTED CEMR LAB: Hgb A1c MFr Bld: 7.2 %

## 2010-01-18 ENCOUNTER — Ambulatory Visit: Payer: Self-pay | Admitting: Internal Medicine

## 2010-01-23 ENCOUNTER — Telehealth (INDEPENDENT_AMBULATORY_CARE_PROVIDER_SITE_OTHER): Payer: Self-pay | Admitting: Family Medicine

## 2010-01-26 ENCOUNTER — Ambulatory Visit: Payer: Self-pay | Admitting: Internal Medicine

## 2010-02-01 ENCOUNTER — Ambulatory Visit: Payer: Self-pay | Admitting: Internal Medicine

## 2010-02-06 ENCOUNTER — Telehealth: Payer: Self-pay | Admitting: *Deleted

## 2010-02-06 ENCOUNTER — Encounter: Payer: Self-pay | Admitting: Family Medicine

## 2010-02-08 ENCOUNTER — Ambulatory Visit: Payer: Self-pay | Admitting: Internal Medicine

## 2010-02-09 ENCOUNTER — Ambulatory Visit: Payer: Self-pay | Admitting: Family Medicine

## 2010-02-13 ENCOUNTER — Telehealth (INDEPENDENT_AMBULATORY_CARE_PROVIDER_SITE_OTHER): Payer: Self-pay | Admitting: Family Medicine

## 2010-02-15 ENCOUNTER — Ambulatory Visit: Payer: Self-pay | Admitting: Internal Medicine

## 2010-02-21 ENCOUNTER — Ambulatory Visit: Payer: Self-pay | Admitting: Internal Medicine

## 2010-02-22 ENCOUNTER — Ambulatory Visit (HOSPITAL_COMMUNITY): Admission: RE | Admit: 2010-02-22 | Discharge: 2010-02-22 | Payer: Self-pay | Admitting: Orthopaedic Surgery

## 2010-03-01 ENCOUNTER — Encounter: Payer: Self-pay | Admitting: Family Medicine

## 2010-03-07 ENCOUNTER — Encounter: Payer: Self-pay | Admitting: Family Medicine

## 2010-03-14 ENCOUNTER — Ambulatory Visit (HOSPITAL_COMMUNITY): Admission: RE | Admit: 2010-03-14 | Discharge: 2010-03-14 | Payer: Self-pay | Admitting: Family Medicine

## 2010-03-14 ENCOUNTER — Encounter (INDEPENDENT_AMBULATORY_CARE_PROVIDER_SITE_OTHER): Payer: Self-pay | Admitting: *Deleted

## 2010-03-14 ENCOUNTER — Ambulatory Visit: Payer: Self-pay | Admitting: Family Medicine

## 2010-03-21 ENCOUNTER — Encounter: Payer: Self-pay | Admitting: Family Medicine

## 2010-03-22 ENCOUNTER — Ambulatory Visit: Payer: Self-pay | Admitting: Internal Medicine

## 2010-04-03 ENCOUNTER — Ambulatory Visit: Payer: Self-pay | Admitting: Family Medicine

## 2010-04-03 ENCOUNTER — Ambulatory Visit: Payer: Self-pay | Admitting: Internal Medicine

## 2010-04-10 ENCOUNTER — Encounter: Payer: Self-pay | Admitting: Family Medicine

## 2010-04-12 ENCOUNTER — Ambulatory Visit: Payer: Self-pay | Admitting: Internal Medicine

## 2010-04-18 ENCOUNTER — Encounter: Admission: RE | Admit: 2010-04-18 | Discharge: 2010-04-18 | Payer: Self-pay | Admitting: Family Medicine

## 2010-04-19 ENCOUNTER — Ambulatory Visit: Payer: Self-pay | Admitting: Internal Medicine

## 2010-04-24 ENCOUNTER — Encounter: Admission: RE | Admit: 2010-04-24 | Discharge: 2010-07-23 | Payer: Self-pay | Admitting: Orthopaedic Surgery

## 2010-05-02 ENCOUNTER — Ambulatory Visit: Payer: Self-pay | Admitting: Internal Medicine

## 2010-05-02 ENCOUNTER — Ambulatory Visit: Payer: Self-pay | Admitting: Family Medicine

## 2010-05-03 ENCOUNTER — Telehealth: Payer: Self-pay | Admitting: *Deleted

## 2010-05-03 ENCOUNTER — Encounter: Payer: Self-pay | Admitting: Family Medicine

## 2010-05-03 ENCOUNTER — Ambulatory Visit (HOSPITAL_COMMUNITY): Admission: RE | Admit: 2010-05-03 | Discharge: 2010-05-03 | Payer: Self-pay | Admitting: Family Medicine

## 2010-05-17 ENCOUNTER — Ambulatory Visit: Payer: Self-pay | Admitting: Internal Medicine

## 2010-05-19 ENCOUNTER — Emergency Department (HOSPITAL_COMMUNITY): Admission: EM | Admit: 2010-05-19 | Discharge: 2010-05-19 | Payer: Self-pay | Admitting: Emergency Medicine

## 2010-05-19 ENCOUNTER — Telehealth: Payer: Self-pay | Admitting: Family Medicine

## 2010-05-21 ENCOUNTER — Telehealth: Payer: Self-pay | Admitting: Family Medicine

## 2010-05-21 ENCOUNTER — Ambulatory Visit: Payer: Self-pay | Admitting: Family Medicine

## 2010-05-21 LAB — CONVERTED CEMR LAB: Hgb A1c MFr Bld: 7.3 %

## 2010-05-30 ENCOUNTER — Ambulatory Visit: Payer: Self-pay | Admitting: Internal Medicine

## 2010-05-30 ENCOUNTER — Ambulatory Visit: Payer: Self-pay | Admitting: Family Medicine

## 2010-05-30 DIAGNOSIS — R269 Unspecified abnormalities of gait and mobility: Secondary | ICD-10-CM | POA: Insufficient documentation

## 2010-05-31 ENCOUNTER — Ambulatory Visit: Payer: Self-pay | Admitting: Internal Medicine

## 2010-06-04 ENCOUNTER — Telehealth: Payer: Self-pay | Admitting: Internal Medicine

## 2010-06-04 LAB — CONVERTED CEMR LAB
BUN: 11 mg/dL (ref 6–23)
CO2: 31 meq/L (ref 19–32)
Calcium: 9.6 mg/dL (ref 8.4–10.5)
Chloride: 105 meq/L (ref 96–112)
Creatinine, Ser: 0.7 mg/dL (ref 0.4–1.2)
GFR calc non Af Amer: 105.28 mL/min (ref >60)
Glucose, Bld: 122 mg/dL — ABNORMAL HIGH (ref 70–99)
Potassium: 3.5 meq/L (ref 3.5–5.1)
Sodium: 144 meq/L (ref 135–145)

## 2010-06-05 ENCOUNTER — Ambulatory Visit: Payer: Self-pay | Admitting: Internal Medicine

## 2010-06-11 ENCOUNTER — Encounter: Payer: Self-pay | Admitting: Family Medicine

## 2010-06-19 ENCOUNTER — Ambulatory Visit: Payer: Self-pay | Admitting: Internal Medicine

## 2010-06-20 ENCOUNTER — Ambulatory Visit: Payer: Self-pay | Admitting: Family Medicine

## 2010-06-22 ENCOUNTER — Encounter (INDEPENDENT_AMBULATORY_CARE_PROVIDER_SITE_OTHER): Payer: Self-pay | Admitting: Pharmacist

## 2010-07-12 ENCOUNTER — Ambulatory Visit: Payer: Self-pay | Admitting: Internal Medicine

## 2010-07-16 ENCOUNTER — Ambulatory Visit: Payer: Self-pay | Admitting: Internal Medicine

## 2010-07-19 ENCOUNTER — Ambulatory Visit: Payer: Self-pay | Admitting: Internal Medicine

## 2010-07-19 ENCOUNTER — Ambulatory Visit (HOSPITAL_BASED_OUTPATIENT_CLINIC_OR_DEPARTMENT_OTHER)
Admission: RE | Admit: 2010-07-19 | Discharge: 2010-07-19 | Payer: Self-pay | Source: Home / Self Care | Admitting: Internal Medicine

## 2010-07-19 ENCOUNTER — Encounter: Payer: Self-pay | Admitting: Internal Medicine

## 2010-08-02 ENCOUNTER — Ambulatory Visit: Payer: Self-pay | Admitting: Internal Medicine

## 2010-08-10 ENCOUNTER — Ambulatory Visit: Payer: Self-pay | Admitting: Family Medicine

## 2010-08-17 ENCOUNTER — Ambulatory Visit: Payer: Self-pay | Admitting: Internal Medicine

## 2010-08-22 ENCOUNTER — Encounter: Payer: Self-pay | Admitting: Family Medicine

## 2010-09-15 ENCOUNTER — Ambulatory Visit: Payer: Self-pay | Admitting: Internal Medicine

## 2010-09-20 ENCOUNTER — Ambulatory Visit: Payer: Self-pay | Admitting: Internal Medicine

## 2010-09-26 ENCOUNTER — Ambulatory Visit
Admission: RE | Admit: 2010-09-26 | Discharge: 2010-09-26 | Payer: Self-pay | Source: Home / Self Care | Attending: Family Medicine | Admitting: Family Medicine

## 2010-09-26 ENCOUNTER — Encounter: Payer: Self-pay | Admitting: Family Medicine

## 2010-09-26 LAB — CONVERTED CEMR LAB
ALT: 52 units/L — ABNORMAL HIGH (ref 0–35)
AST: 61 units/L — ABNORMAL HIGH (ref 0–37)
Albumin: 4.4 g/dL (ref 3.5–5.2)
Alkaline Phosphatase: 106 units/L (ref 39–117)
BUN: 14 mg/dL (ref 6–23)
CO2: 29 meq/L (ref 19–32)
Calcium: 10 mg/dL (ref 8.4–10.5)
Chloride: 100 meq/L (ref 96–112)
Creatinine, Ser: 0.78 mg/dL (ref 0.40–1.20)
Direct LDL: 151 mg/dL — ABNORMAL HIGH
Glucose, Bld: 157 mg/dL — ABNORMAL HIGH (ref 70–99)
Hgb A1c MFr Bld: 7.7 %
Potassium: 3.7 meq/L (ref 3.5–5.3)
Sodium: 140 meq/L (ref 135–145)
Total Bilirubin: 0.4 mg/dL (ref 0.3–1.2)
Total Protein: 7.3 g/dL (ref 6.0–8.3)

## 2010-09-27 ENCOUNTER — Encounter: Payer: Self-pay | Admitting: Internal Medicine

## 2010-09-30 ENCOUNTER — Ambulatory Visit: Payer: Self-pay | Admitting: Internal Medicine

## 2010-10-01 ENCOUNTER — Encounter: Payer: Self-pay | Admitting: Family Medicine

## 2010-10-01 ENCOUNTER — Telehealth: Payer: Self-pay | Admitting: *Deleted

## 2010-10-02 NOTE — Progress Notes (Signed)
Summary: phn msg  Phone Note Call from Patient Call back at Home Phone [phone]   Caller: Patient Summary of Call: [employer] does not honor [facility] and needs to find another place that will take her card... Initial call taken by: De Nurse,  February 13, 2010 8:55 AM  Follow-up for Phone Call        Notified pt to call ins company and get the names from thier list of providers that they participate with for diabetic shoes, voiced understanding Follow-up by: Gladstone Pih,  February 13, 2010 9:14 AM

## 2010-10-02 NOTE — Letter (Signed)
Summary: [facility]   Imported By: Clydell Hakim 06/06/2009 16:55:57  _____________________________________________________________________  External Attachment:    Type:   Image     Comment:   External Document

## 2010-10-02 NOTE — Consult Note (Signed)
Summary: [facility]   Imported By: Bradly Bienenstock 03/20/2007 14:29:49  _____________________________________________________________________  External Attachment:    Type:   Image     Comment:   External Document

## 2010-10-02 NOTE — Consult Note (Signed)
Summary: [facility]   [facility]   Imported By: Clydell Hakim 12/28/2009 14:20:03  _____________________________________________________________________  External Attachment:    Type:   Image     Comment:   External Document

## 2010-10-02 NOTE — Progress Notes (Signed)
  Phone Note Call from Patient   Summary of Call: Dr Jennette Kettle  pt called this am, she had her CT scan done yesterday and is waiting to hear from you for results.  She has apt later this am so she asked that if you can not reach her at her home number in the chart then to use her cell # [phone].  Also she has agreed to go to the [facility] in [place](closest for her insurance), Larita Fife is working on referral. Initial call taken by: Gladstone Pih,  December 29, 2009 8:43 AM  Follow-up for Phone Call        I spoke w her re her CT and labs. Will stop gabapentin, f/u 3 w     Appended Document:  pt is requesting to talk to Dr Jennette Kettle - [phone]

## 2010-10-02 NOTE — Miscellaneous (Signed)
Summary: Orders Update  Clinical Lists Changes  Problems: Added new problem of ENCOUNTER FOR LONG-TERM USE OF OTHER MEDICATIONS (ICD-V58.69) Orders: Added new Test order of B12-[facility] ([id number]) - Signed Added new Test order of CBC-[facility] (85027) - Signed  Per Dr. Neal 

## 2010-10-02 NOTE — Progress Notes (Signed)
Summary: phn msg  Phone Note Call from Patient Call back at [facility] Phone [phone]   Caller: Patient Summary of Call: Pt seen in ed this past weekend for burning in legs.  Just wanted to let Dr. Jennette Kettle know and that she is going to see Dr. Earlene Plater this afternoon at 2:30 for this.   Initial call taken by: Clydell Hakim,  May 21, 2010 8:58 AM

## 2010-10-02 NOTE — Miscellaneous (Signed)
  Clinical Lists Changes  Orders: Added new Referral order of Podiatry Referral (Podiatry) - Signed  appointment scheduled 03/20/2010 at 10:00 AM with Dr. Suzette Battiest of  [facility]. patient notified.  Theresia Lo RN  March 14, 2010 3:10 PM     Complete Medication List: 1)  Novolog Mix 70/30 Flexpen 70-30 % Susp (Insulin aspart prot & aspart) .... 28 units prior and breakfast and 28 units prior to evening meal.  dispense one month supply. 2)  Metformin Hcl 1000 Mg Tabs (Metformin hcl) .... Take 1.5  tablets by mouth in in the am and 1 tablets in the pm 3)  Potassium Chloride 40 Meq/40ml (20%) Liqd (Potassium chloride) .[name].. 15 cc two times a day disp qs 3 m 4)  Hyzaar 100-25 Mg Tabs (Losartan potassium-hctz) .[name].. 1 by mouth once daily 5)  Metoprolol Succinate 50 Mg Xr24h-tab (Metoprolol succinate) .... Take one tablet two times a day 6)  Clonidine Hcl 0.1 Mg Tabs (Clonidine hcl) .[name].. 1 tablet by mouth twice a day 7)  Lasix 20 Mg Tabs (Furosemide) .... Take 1 tablet by mouth twice a day 8)  Simvastatin 40 Mg Tabs (Simvastatin) .[name].. 1 by mouth once daily 9)  Nexium 40 Mg Cpdr (Esomeprazole magnesium) .[name].. 1 capsule by mouth twice a day 10)  Acyclovir 800 Mg Tabs (Acyclovir) .... Take 1 tabletb id/qd as directed 11)  Lancets For Glucometer Testing  12)  Brovana 15 Mcg/40ml Nebu (Arformoterol tartrate) .[name].. 1 neb every twice daily if needed 13)  Bayer Aspirin 325 Mg Tabs (Aspirin) .... Take 1 tablet by mouth once a day 14)  Flexfot Cpap Mask 407  15)  Zyrtec Allergy 10 Mg Tabs (Cetirizine hcl) .[name].. 1 by mouth at bedtime on hold per dr young 16)  Qvar 80 Mcg/act Aers (Beclomethasone dipropionate) .... 2 puffs twice daily 17)  Xalatan 0.005 % Soln (Latanoprost) .[name].. 1 drop each eye at bedtime 18)  Cpap 12 Advanced  19)  Allergy Vaccine Gh 1:10  20)  Trusopt 2 % Soln (Dorzolamide hcl) .[name].. 1 drop in left eye twice daily 21)  Astepro 137 Mcg/spray Soln (Azelastine hcl) .... 2 sprays daily as  needed 22)  Singulair 10 Mg Tabs (Montelukast sodium) .... One by mouth qd 23)  Zoloft 100 Mg Tabs (Sertraline hcl) .[name].. 1 1/2 tabs by mouth qd 24)  Allopurinol 300 Mg Tabs (Allopurinol) .[name].. 1 by mouth qd 25)  Prednisone 20 Mg Tabs (Prednisone) .[name].. 1 by mouth once daily for 5 days 26)  Diabetic Shoes  .... Patient with peripheral neuropathy and loss of sensation in feet, some callouses

## 2010-10-02 NOTE — Progress Notes (Signed)
  Phone Note Outgoing Call   Summary of Call: DEAR [employer] please call in her gabapentin as below Thanks!     Prescriptions: GABAPENTIN 100 MG CAPS (GABAPENTIN) sig 1 by mouth at bedtime 1 week then increase to two by mouth at bedtime for 1 week then increase to three by mouth at bedtime.  #90 x 1   Entered and Authorized by:   Denny Levy MD   Signed by:   Denny Levy MD on 06/28/2009   Method used:   Telephoned to ...       [facility] (retail)       [address]       [place], [place]  [address]       Ph: [phone]       Fax: [phone]   RxID:   [id number]  Called it in.  Dennison Nancy RN  June 28, 2009 5:14 PM

## 2010-10-02 NOTE — Assessment & Plan Note (Signed)
Summary: FOLLOW UP/ MBW   PCP:  Denny Levy MD  Chief Complaint:  fu visit...Marland KitchenMarland KitchenMarland Kitchenreviewed meds.....  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 A vocal cord (? paralysed) has been injected with botox.A pH probe on Nexium and Reglan showed some breakthrough reflux, but I was not impressed with this. d/c'd reglan due to risk of tardive dyskinesia.  Chest CT in 3/08 did not show any evidence of fibrosis. Spirometry has shown restriction without any evidence of obstruction, which is why I have not pursued a methacholine challenge.  Bronchoscopy with transbronchial biopsies have been nondiagnostic.  She had marginal improvement with antihistaminic/decongestant therapy.  Pseudoephedrine made her heart race as did bronchodilators and Advair.In summary, I have discussed with [name] [name] that she may very well may have to just have to endure this problem. If she has flareups.  We will try to give her oral steroids. We will maintain her on a regimen of Qvar 80 micrograms two pffs two times a day, Nexium and singulair. Spiriva will be discontinued. She will use Flonase daily, and promethazine with codeine syrup on a p.r.n. basis.  9/09>> Feels well, no recent flare-ups, intermittently compliant with CPAP     Updated Prior Medication List: LANTUS SOLOSTAR 100 UNIT/ML  SOLN (INSULIN GLARGINE) as directed 32 units each morning disp 1 month or can dispense three months as patient wishes HYZAAR 100-25 MG  TABS (LOSARTAN POTASSIUM-HCTZ) 1 by mouth once daily METOPROLOL SUCCINATE 25 MG TB24 (METOPROLOL SUCCINATE) take one tab  two times a day CLONIDINE HCL 0.1 MG TABS (CLONIDINE HCL) 1 tablet by mouth twice a day CRESTOR 20 MG TABS (ROSUVASTATIN CALCIUM) Take 1 tablet by mouth once a day LASIX 20 MG TABS (FUROSEMIDE) Take 1 tablet by mouth twice a day METFORMIN HCL 1000 MG TABS (METFORMIN HCL)  Take 1.5  tablets by mouth in in the AM and 1 tablets in the PM NEXIUM 40 MG CPDR (ESOMEPRAZOLE MAGNESIUM) 1 capsule by mouth twice a day SINGULAIR 10 MG TABS (MONTELUKAST SODIUM) Take 1 tablet by mouth once a day SUCRALFATE 1 GM TABS (SUCRALFATE) Take 1 tablet by mouth four times a day NITROGLYCERIN 0.4 MG SUBL (NITROGLYCERIN) Place 1 tablet under tongue as directed ACYCLOVIR 800 MG TABS (ACYCLOVIR) Take 1 tabletb id/qd as directed ALBUTEROL SULFATE 1.25 MG/3ML NEBU (ALBUTEROL SULFATE) Inhale 1 vial as directed twice a day BAYER ASPIRIN 325 MG TABS (ASPIRIN) Take 1 tablet by mouth once a day * FLEXFOT CPAP MASK 407  * LANCETS FOR GLUCOMETER TESTING  NOVOLOG FLEXPEN 100 UNIT/ML  SOLN (INSULIN ASPART) 12 units prior to Breakfast 12 units prior to Evening Meal Supply one box of pens. dispense one or three months supply as patient wishes ZYRTEC ALLERGY 10 MG  TABS (CETIRIZINE HCL) 1 by mouth at bedtime QVAR 80 MCG/ACT  AERS (BECLOMETHASONE DIPROPIONATE) 2 puffs twice daily XALATAN 0.005 %  SOLN (LATANOPROST) 1 drop each eye at bedtime TESSALON 200 MG  CAPS (BENZONATATE) 1 by mouth three times a day as needed cough * CPAP 12 ADVANCED  * ALLERGY VACCINE GH 1:50  * ALLERGY VACCINE GH ADVANCE TO 1:10 NEXT ORDER Build per protocol TRUSOPT 2 % SOLN (DORZOLAMIDE HCL) 1 drop in left eye twice daily ASTEPRO 137 MCG/SPRAY SOLN (AZELASTINE HCL) 2 sprays daily as needed PROAIR HFA 108 (90 BASE) MCG/ACT AERS (ALBUTEROL SULFATE) 2 sprays twice a day and as needed  for shortness of breath  Current Allergies: DARVON (PROPOXYPHENE HCL) DIOVAN (VALSARTAN) FLAGYL (METRONIDAZOLE) METOPROLOL TARTRATE (METOPROLOL TARTRATE) NORVASC (AMLODIPINE BESYLATE) PRINZIDE (LISINOPRIL-HYDROCHLOROTHIAZIDE)  Past Medical History:    Reviewed history from 03/14/2008 and no changes required:       chronic cough--now followed by pulmonary       moderate sleep apnea--cpap 12       hx of systolic ejection murmur        ?hx  vocal cord paresis - scope normal 2008                   Review of Systems       The patient complains of prolonged cough.  The patient denies anorexia, fever, weight loss, weight gain, vision loss, decreased hearing, hoarseness, chest pain, syncope, dyspnea on exertion, peripheral edema, headaches, hemoptysis, abdominal pain, melena, hematochezia, severe indigestion/heartburn, hematuria, incontinence, genital sores, muscle weakness, suspicious skin lesions, transient blindness, difficulty walking, depression, unusual weight change, abnormal bleeding, enlarged lymph nodes, angioedema, breast masses, and testicular masses.     Vital Signs:  Patient Profile:   60 Years Old Female Height:     61.5 inches (156.21 cm) Weight:      170 pounds BMI:     31.72 O2 Sat:      97 % O2 treatment:    Room Air Temp:     98.0 degrees F oral Pulse rate:   67 / minute BP sitting:   130 / 70  (left arm) Cuff size:   regular  Vitals Entered By: Clarise Cruz Duncan Dull) (May 23, 2008 2:56 PM)                 Physical Exam  General:     HEENT - no thrush, no post nasal drip No JVD, no lymphadenopathy CVS- s1s2 nml, no murmur RS- clear, no crackles or rhonchi Abd- soft, non-tender, no organomegaly CNS- non focal Ext - no edema       Impression & Recommendations:  Problem # 1:  COUGH (ICD-786.2) She seems stable on her current regimen. Tessalon has been self d/c'd since she has a copay - I am fine with that. Qvar seems to be of some benefit. Orders: Est. Patient Level III ([address])   Problem # 2:  OBSTRUCTIVE SLEEP APNEA (ICD-327.23) ct CPAP 12, encouraged compliance. Orders: Est. Patient Level III ([address])   Problem # 3:  GERD (ICD-530.81) Nexium helps, she is not always compliant with sucralfate. Her updated medication list for this problem includes:    Nexium 40 Mg Cpdr (Esomeprazole magnesium) .[name]... 1 capsule by mouth twice a day    Sucralfate 1 Gm Tabs (Sucralfate)  .[name]... Take 1 tablet by mouth four times a day  Orders: Est. Patient Level III ([address]78)   Medications Added to Medication List This Visit: 1)  Astepro 137 Mcg/spray Soln (Azelastine hcl) .... 2 sprays daily as needed   Patient Instructions: 1)  Please schedule a follow-up appointment as needed   ]

## 2010-10-02 NOTE — Assessment & Plan Note (Signed)
Summary: [facility]   Vital Signs:  Patient Profile:   60 Years Old Female Height:     61.5 inches (156.21 cm) Weight:      190 pounds Temp:     98.4 degrees F Pulse rate:   75 / minute BP sitting:   159 / 86  Pt. in pain?   yes    Location:   feet    Intensity:   5    Type:       burning  Vitals Entered By: Jone Baseman CMA (June 08, 2008 11:03 AM)                 Last Flu Vaccine:  Fluvax 3+ (05/25/2007 9:48:34 AM) Flu Vaccine Result Date:  06/08/2008 Flu Vaccine Result:  given Last Flex Sig:  Done. (10/31/2005 12:00:00 AM) Flex Sig Result Date:  10/31/2005 Flex Sig Result:  not indicated Flex Sig Next Due:  Not Indicated Last Hemoccult Result: Done. (10/03/2005 12:00:00 AM) Hemoccult Result Date:  06/09/2008 Hemoccult Result:  not indicated Hemoccult Next Due:  Not Indicated s/p hysterectomy   PCP:  Denny Levy MD  Chief Complaint:  f/u.  History of Present Illness: Diabetes follow up with blood sugars in    good control,   no episodes of low blood sugar. Taking medicines regularly and having no problems with them.  f/u chronic cough--pulmonary's final conclusion seems to be itis multifactorial and they have noo new therapy for it. We had started her on sucralfate plus ppi to see if that would help and made no difference--in fact the sucralfate seems to make her feel funny so she stopped it 1 m ago and has sen no change in signs. wants to stop it permanently  she says her swallowing study was abnormal back in teh summer and wonders how often it should be repeated  foot neuropathy is a little worse, burning needles and pins  her metoprolol recently increased and she is still a little high today    Current Allergies: DARVON (PROPOXYPHENE HCL) DIOVAN (VALSARTAN) FLAGYL (METRONIDAZOLE) NORVASC (AMLODIPINE BESYLATE) PRINZIDE (LISINOPRIL-HYDROCHLOROTHIAZIDE)    Risk Factors:  Mammogram History:     Date of Last Mammogram:  04/19/2008   Colonoscopy History:      Date of Last Colonoscopy:  10/31/2005     Physical Exam  General:     alert and well-nourished.   Eyes:     legally blind, resrting eye movements Neck:     supple, full ROM, and no masses.  no bruits Lungs:     normal respiratory effort and normal breath sounds.   Heart:     normal rate, regular rhythm, and no murmur.   Neurologic:     alert & oriented X3.   hyperesthesia and decreased sensation to light touch B le in stocking pattern to mid shin. good cap refill and pulses B in feet    Impression & Recommendations:  Problem # 1:  HYPERTENSION, BENIGN SYSTEMIC (ICD-401.1) Assessment: Deteriorated  Her updated medication list for this problem includes:    Hyzaar 100-25 Mg Tabs (Losartan potassium-hctz) .[name]... 1 by mouth once daily    Metoprolol Succinate 25 Mg Tb24 (Metoprolol succinate) .[name]... Take one tab  two times a day    Clonidine Hcl 0.1 Mg Tabs (Clonidine hcl) .[name]... 1 tablet by mouth twice a day    Lasix 20 Mg Tabs (Furosemide) .[name]... Take 1 tablet by mouth twice a day increased metoprolol recently--still a little high recheck 1 m Orders:  [facility]- Est  Level 4 ([address])   Problem # 2:  DIABETES MELLITUS II, UNCOMPLICATED (ICD-250.00) Assessment: Unchanged  Her updated medication list for this problem includes:    Lantus Solostar 100 Unit/ml Soln (Insulin glargine) .[name]... As directed 32 units each morning disp 1 month or can dispense three months as patient wishes    Hyzaar 100-25 Mg Tabs (Losartan potassium-hctz) .[name]... 1 by mouth once daily    Metformin Hcl 1000 Mg Tabs (Metformin hcl) .[name]... Take 1.5  tablets by mouth in in the am and 1 tablets in the pm    Bayer Aspirin 325 Mg Tabs (Aspirin) .[name]... Take 1 tablet by mouth once a day    Novolog Flexpen 100 Unit/ml Soln (Insulin aspart) .[name]... 12 units prior to breakfast 12 units prior to evening meal supply one box of pens. 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 As directed 32 units each morning disp 1 month or can dispense three months as patient wishes 2)  Hyzaar 100-25 Mg Tabs (Losartan potassium-hctz) .[name].. 1 by mouth once daily 3)  Metoprolol Succinate 25 Mg Tb24 (Metoprolol succinate) .... Take one tab  two times a day 4)  Clonidine Hcl 0.1 Mg Tabs (Clonidine hcl) .[name].. 1 tablet by mouth twice a day 5)  Crestor 20 Mg Tabs (Rosuvastatin calcium) .... Take 1 tablet by mouth once a day 6)  Lasix 20 Mg Tabs (Furosemide) .... Take 1 tablet by mouth twice a day 7)  Metformin Hcl 1000 Mg Tabs (Metformin hcl) .... Take 1.5  tablets by mouth in in the am and 1 tablets in the pm 8)  Nexium 40 Mg Cpdr (Esomeprazole magnesium) .[name].. 1 capsule by mouth twice a day 9)  Singulair 10 Mg Tabs (Montelukast sodium) .... Take 1 tablet by mouth once a day 10)  Sucralfate 1 Gm Tabs (Sucralfate) .... Take 1 tablet by mouth four times a day 11)  Nitroglycerin 0.4 Mg Subl (Nitroglycerin) .... Place 1 tablet under tongue as directed 12)  Acyclovir 800 Mg Tabs (Acyclovir) .... Take 1 tabletb id/qd as directed 13)  Albuterol Sulfate 1.25 Mg/14ml Nebu (Albuterol sulfate) .... Inhale 1 vial as directed twice a day 14)  Bayer Aspirin 325 Mg Tabs (Aspirin) .... Take 1 tablet by mouth once a day 15)  Flexfot Cpap Mask 407  16)  Lancets For Glucometer Testing  17)  Novolog Flexpen 100 Unit/ml Soln (Insulin aspart) .[name].. 12 units prior to  breakfast 12 units prior to evening meal supply one box of pens. dispense one or three months supply as patient wishes 18)  Zyrtec Allergy 10 Mg Tabs (Cetirizine hcl) .[name].. 1 by mouth at bedtime 19)  Qvar 80 Mcg/act Aers (Beclomethasone dipropionate) .... 2 puffs twice daily 20)  Xalatan 0.005 % Soln (Latanoprost) .[name].. 1 drop each eye at bedtime 21)  Tessalon 200 Mg Caps (Benzonatate) .[name].. 1 by mouth  three times a day as needed cough 22)  Cpap 12 Advanced  23)  Allergy Vaccine Gh 1:50  24)  Allergy Vaccine Gh Advance To 1:10 Next Order  .... Build per protocol 25)  Trusopt 2 % Soln (Dorzolamide hcl) .[name].. 1 drop in left eye twice daily 26)  Astepro 137 Mcg/spray Soln (Azelastine hcl) .... 2 sprays daily as needed 27)  Proair Hfa 108 (90 Base) Mcg/act Aers (Albuterol sulfate) .... 2 sprays twice a day and as needed for shortness of breath  Other Orders: Dexa scan (Dexa scan)   Patient Instructions: 1)  See me in early December for a follow up   ]  Influenza Vaccine    Vaccine Type: Fluvax 3+    Dose: 0.5 ml    Route: IM    Exp. Date: 03/01/2009  Flu Vaccine Consent Questions    Do you have a history of severe allergic reactions to this vaccine? no    Any prior history of allergic reactions to egg and/or gelatin? no    Do you have a sensitivity to the preservative Thimersol? no    Do you have a past history of Guillan-Barre Syndrome? no    Do you currently have an acute febrile illness? no    Have you ever had a severe reaction to latex? no    Vaccine information given and explained to patient? yes    Are you currently pregnant? no

## 2010-10-02 NOTE — Miscellaneous (Signed)
   Clinical Lists Changes  Observations: Added new observation of LDLNXTDUE: 11/02/2008 (08/30/2008 16:58)     Last LDL:  79 (11/03/2007 11:02:00 AM) LDL Next Due:  1 yr

## 2010-10-02 NOTE — Assessment & Plan Note (Signed)
Summary: F/U   DPG    Vital Signs:  Patient Profile:   60 Years Old Female Height:     61.5 inches (156.21 cm) Weight:      182.8 pounds Temp:     97.9 degrees F Pulse rate:   76 / minute BP sitting:   129 / 76  (left arm)  Pt. in pain?   no  Vitals Entered By: Jacki Cones RN (October 28, 2007 10:53 AM)                  PCP:  Denny Levy MD  Chief Complaint:  f/u and and pt has cough .  History of Present Illness: Follow up hypertension. Taking medicines regularly with no problems. Not having any any headaches or chest pains.  Diabetes follow up with blood sugars in   fairly good control,   no episodes of low blood sugar. Taking medicines regularly and having no problems with them. has been following w pharmact clinic for insulin adjustment  started allergy shots earlier this moth still most aggravating problem is cough--nothing seem to make it better     Current Allergies: DARVON (PROPOXYPHENE HCL) DIOVAN (VALSARTAN) FLAGYL (METRONIDAZOLE) METOPROLOL TARTRATE (METOPROLOL TARTRATE) NORVASC (AMLODIPINE BESYLATE) PRINZIDE (LISINOPRIL-HYDROCHLOROTHIAZIDE)  Past Medical History:        Breast lumpectomy    Reduction mamoplasty    eval for vocal cord paralysios/olyp              Past Surgical History:    Anal fistula repair -,     Arthroscopy--R shoulder/R knee 12/01 - 10/07/2000,     breast reduction - 04/21/2003,     BTL--`87 -,     cardiac Cath--nl ef, no cad - 10/04/2003, echo--tricus regurg, mild pul htn, ef 60 - 03/02/2004,     ENT Naso-pharyn scope-normal - 09/11/2006,     Hysterectomy - 10/07/2000, l    . rotator cuff surgery - 10/18/2003,     T-score: -.0.54 (low risk currently) - 09/02/2004    bronchoscopy for chronic cough 07/2007    Risk Factors: Tobacco use:  never Exercise:  yes    Times per week:  3    Type:  biking  Colonoscopy History:    Date of Last Colonoscopy:  10/31/2005  Mammogram History:    Date of Last Mammogram:  04/13/2007    Review of Systems       The patient complains of prolonged cough.  The patient denies anorexia, fever, weight loss, hoarseness, chest pain, syncope, dyspnea on exhertion, peripheral edema, abdominal pain, difficulty walking, and depression.     Physical Exam  General:     alert.   Eyes:     resting nystagmus. legally blind Mouth:     op without lesions, dentition OK Neck:     supple, full ROM, and no masses.   Lungs:     normal respiratory effort and normal breath sounds.  despite her chronic cough even whilein clinic her lungs are totally clear Heart:     normal rate, regular rhythm, and no murmur.   Abdomen:     soft and normal bowel sounds.   Msk:     normal ROM.   Neurologic:     gait normal.   Psych:     Oriented X3, normally interactive, not anxious appearing, and not depressed appearing.      Impression & Recommendations:  Problem # 1:  DIABETES MELLITUS II, UNCOMPLICATED (ICD-250.00)  Her updated medication list for this  problem includes:    Lantus Solostar 100 Unit/ml Soln (Insulin glargine) .[name]... As directed 26 units each morning    Hyzaar 100-25 Mg Tabs (Losartan potassium-hctz) .[name]... 1 by mouth once daily    Metformin Hcl 1000 Mg Tabs (Metformin hcl) .[name]... Take 1.5  tablets by mouth in in the am and 1 tablets in the pm    Bayer Aspirin 325 Mg Tabs (Aspirin) .[name]... Take 1 tablet by mouth once a day    Novolog Flexpen 100 Unit/ml Soln (Insulin aspart) .[name]... 8 units prior to breakfast 10 units prior to evening meal supply one box of pens  Orders: Comp Met-[facility] (04540-98119) A1C-[facility] (14782) [facility]- Est  Level 4 (95621) continue to follow w pharmact clinic for adjustment of insulin--she seems to be improving--looked at her sugars today. one low of 49 was asx. A1C today appreciate pharm clinic help  Problem # 2:  HYPERTENSION, BENIGN SYSTEMIC (ICD-401.1) Assessment: Improved  Her updated medication list for this problem includes:    Hyzaar 100-25 Mg Tabs  (Losartan potassium-hctz) .[name]... 1 by mouth once daily    Metoprolol Succinate 25 Mg Tb24 (Metoprolol succinate) .[name]... Take one tab  two times a day    Clonidine Hcl 0.1 Mg Tabs (Clonidine hcl) .[name]... 1 tablet by mouth twice a day    Lasix 20 Mg Tabs (Furosemide) .[name]... 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 There is some sel-chronicity to this cough--I think the more she coughs the more irritated her airway gets so the more she coughs. I am not sure she agrees with this assessment.  Complete Medication List: 1)  Lantus Solostar 100 Unit/ml Soln (Insulin glargine) .... As directed 26 units each morning 2)  Hyzaar 100-25 Mg Tabs (Losartan potassium-hctz) .[name].. 1 by mouth once daily 3)  Metoprolol Succinate 25 Mg Tb24 (Metoprolol succinate) .... Take one tab  two times a day 4)  Clonidine Hcl 0.1 Mg Tabs (Clonidine hcl) .[name].. 1 tablet by mouth twice a day 5)  Crestor 20 Mg Tabs (Rosuvastatin calcium) .... Take 1 tablet by mouth once a day 6)  Lasix 20 Mg Tabs (Furosemide) .... Take 1 tablet by mouth twice a day 7)  Metformin Hcl 1000 Mg Tabs (Metformin hcl) .... Take 1.5  tablets by mouth in in the am and 1 tablets in the pm 8)  Nexium 40 Mg Cpdr (Esomeprazole magnesium) .[name].. 1 capsule by mouth twice a day 9)  Reglan 10 Mg Tabs (Metoclopramide hcl) .... 1/2 three times a day with meals and 1 at bedtime 10)  Singulair 10 Mg Tabs (Montelukast sodium) .... Take 1 tablet by mouth once a day 11)  Sucralfate 1 Gm Tabs (Sucralfate) .... Take 1 tablet by mouth four times a day 12)  Nitroglycerin 0.4 Mg Subl (Nitroglycerin) .... Place 1 tablet under tongue as directed 13)  Acyclovir 800 Mg  Tabs (Acyclovir) .... Take 1 tabletb id/qd as directed 14)  Albuterol Sulfate 1.25 Mg/48ml Nebu (Albuterol sulfate) .... Inhale 1 vial as directed twice a day 15)  Bayer Aspirin 325 Mg Tabs (Aspirin) .... Take 1 tablet by mouth once a day 16)  Proventil Hfa 108 (90 Base) Mcg/act Aers (Albuterol sulfate) .... Inhale 2  puff using inhaler every four hours 17)  Flexfot Cpap Mask 407  18)  Lancets For Glucometer Testing  19)  Novolog Flexpen 100 Unit/ml Soln (Insulin aspart) .... 8 units prior to breakfast 10 units prior to evening meal supply one box of pens 20)  Potassium Chloride 20 Meq Pack (Potassium chloride) .... One daily 21)  Zyrtec Allergy 10 Mg Tabs (Cetirizine hcl) .[name].. 1 by mouth at bedtime 22)  Qvar 80 Mcg/act Aers (Beclomethasone dipropionate) .... 2 puffs twice daily 23)  Xalatan 0.005 % Soln (Latanoprost) .[name].. 1 drop each eye at bedtime   Patient Instructions: 1)  Please schedule a follow-up appointment in 3 months.    ]  Appended Document: A1C RESULTS     Lab Visit   Laboratory Results   Blood Tests   Date/Time Received: October 28, 2007 11:10 AM  Date/Time Reported: October 28, 2007 11:27 AM   HGBA1C: 7.5%   (Normal Range: Non-Diabetic - 3-6%   Control Diabetic - 6-8%)  Comments: ...................................................................DONNA [facility]  October 28, 2007 11:27 AM      Orders Today:

## 2010-10-02 NOTE — Progress Notes (Signed)
  Medications Added ZOLOFT 100 MG TABS (SERTRALINE HCL) 1 1/2 tabs by mouth qd       Phone Note Outgoing Call   Summary of Call: Kennon Rounds  I received a fax from [facility] asking for a refill on sertraline 9zoloft)--it looks like original rx was written 05/26/07 and according to their records she has filled it intermittently since 08--can you call her and ask her about this? Is she taking it? I'll bet Dr bassett rx it in 08, it never got on her med list and she has not been taking full dose so that is why she is running out now. Let me know what you find out. I am fine w her being on it, just want to know and know her true dose. Thanks!  Denny Levy MD  December 13, 2008 4:42 PM   Follow-up for Phone Call        left message for pt to call us back Follow-up by: Golden Circle RN,  December 13, 2008 4:43 PM  Additional Follow-up for Phone Call Additional follow up Details #1::        states she takes 1.5 pills each day-100mg  tabs. sent refill back to [facility] with 3 RF Additional Follow-up by: Golden Circle RN,  December 14, 2008 9:04 AM    New/Updated Medications: ZOLOFT 100 MG TABS (SERTRALINE HCL) 1 1/2 tabs by mouth qd   Prescriptions: ZOLOFT 100 MG TABS (SERTRALINE HCL) 1 1/2 tabs by mouth qd  #45 x 3   Entered and Authorized by:   Denny Levy MD   Signed by:   Denny Levy MD on 12/15/2008   Method used:   Historical   RxID:   [id number]

## 2010-10-02 NOTE — Progress Notes (Signed)
Summary: Nuc Pre-Procedure  Phone Note Outgoing Call Call back at [facility] Phone [phone]   Call placed by: Leonia Corona, RT-N,  May 22, 2009 3:29 PM Call placed to: Patient Reason for Call: Confirm/change Appt Summary of Call: Reviewed information on [employer] Information Sheet (see scanned document for further details).  Spoke with patient.     Nuclear Med Background Indications for Stress Test: Evaluation for Ischemia   History: COPD, Echo, Heart Catheterization  History Comments: 2005- Cath- N/O CAD. Nl. EF 208- Echo- Nl. LV Fx. OSA(CPAP); GERD  Symptoms: Chest Pain, SOB    Nuclear Pre-Procedure Cardiac Risk Factors: History of Smoking, Hypertension, IDDM Type 2, Lipids Height (in): 61

## 2010-10-02 NOTE — Progress Notes (Signed)
Summary: phn msg  Phone Note Call from Patient Call back at Home Phone [phone]   Caller: Patient Summary of Call: Pt does have appointment for nerve conduction study on March 31st.  Also has appt with Dr. Loreta Ave on April the 7th.   Initial call taken by: Clydell Hakim,  November 03, 2009 2:42 PM  Follow-up for Phone Call        noted. Follow-up by: Theresia Lo RN,  November 03, 2009 3:09 PM

## 2010-10-02 NOTE — Progress Notes (Signed)
     Prescriptions: NOVOLOG FLEXPEN 100 UNIT/ML  SOLN (INSULIN ASPART) 12 units prior to Breakfast 12 units prior to Evening Meal Supply one box of pens. dispense one or three months supply as patient wishes  #1 x 12   Entered and Authorized by:   Denny Levy MD   Signed by:   Denny Levy MD on 03/02/2008   Method used:   Telephoned to ...       Jadene Pierini / [facility]       [address]       [place], [place]  [address]       Ph: [phone]       Fax: [phone]   RxID:   [id number] LANTUS SOLOSTAR 100 UNIT/ML  SOLN (INSULIN GLARGINE) as directed 32 units each morning disp 1 month or can dispense three months as patient wishes  #1 x 12   Entered and Authorized by:   Denny Levy MD   Signed by:   Denny Levy MD on 03/02/2008   Method used:   Telephoned to ...       Jadene Pierini / [facility]       [address]       [place], [place]  [address]       Ph: [phone]       Fax: [phone]   RxID:   [id number]   DEAR RED TEAM:  can you please call this in to [facility] Thanks!  Huntley Dec NEAL MD  March 02, 2008 4:17 PM  Prescriptions called in as written above................................Rica Heather LPN  March 02, 257 5:13 PM

## 2010-10-02 NOTE — Consult Note (Signed)
Summary: [facility]   Imported By: Bradly Bienenstock 04/02/2010 12:05:36  _____________________________________________________________________  External Attachment:    Type:   Image     Comment:   External Document

## 2010-10-02 NOTE — Assessment & Plan Note (Signed)
Summary: 2 weeks/apc     PCP:  Denny Levy MD   History of Present Illness: [name] [name] returns for a follow-up after bronchoscopy   For chronic cough.  Bronchoscopy did not reveal any endobronchial lesions. transbronchial biopsies did not show any granulomas or eosinophilic infiltrate.  These results were discussed. She was started on oral steroids with marginal benefit.  She states that her cough may be slightly better  Current Allergies: DARVON (PROPOXYPHENE HCL) DIOVAN (VALSARTAN) FLAGYL (METRONIDAZOLE) METOPROLOL TARTRATE (METOPROLOL TARTRATE) NORVASC (AMLODIPINE BESYLATE) PRINZIDE (LISINOPRIL-HYDROCHLOROTHIAZIDE)     Review of Systems  The patient denies hoarseness and hemoptysis.     Physical Exam  Nose:     clear nasal discharge.   Mouth:     no deformity or lesions Lungs:     clear bilaterally to auscultation and percussion Heart:     regular rate and rhythm, S1, S2 without murmurs, rubs, gallops, or clicks    Impression & Recommendations:  Problem # 1:  COUGH (ICD-786.2) this has been persistent for 20 years.  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  a pH probe on Nexium and Reglan showed some breakthrough reflux, but I was not impressed with this.  Chest CT in 3/08 did not show any evidence of fibrosis. Spirometry has shown restriction without any evidence of obstruction, which is why I have not pursued.  A methacholine challenge.  Bronchoscopy with transbronchial biopsies have been nondiagnostic.  She had marginal improvement with antihistaminic/decongestant therapy.  Pseudoephedrine made her heart race as did bronchodilators and Advair.in summary, I discussed with [name] [name] that she may very well may have to just have to endure this problem. if she has flareups.  We will try to give her oral steroids. We will maintain her on a regimen of Qvar 80 micrograms two pffs two times a day, Nexium, Reglan, and singulair. Spiriva will be discontinued. She will  use Flonase daily, and promethazine with codeine syrup on a p.r.n. basis only.  Prescriptions were written.  she will follow-up with our nurse practitioner in a months time and with me in 3 months. Oral steroids can be difficult to off for the next 4 weeks     ]

## 2010-10-02 NOTE — Progress Notes (Signed)
Summary: triage  Phone Note Call from Patient Call back at Home Phone [phone]   Caller: Patient Summary of Call: Pt asking for Kennon Rounds to give her a call.  Follow-up for Phone Call        LM Follow-up by: Golden Circle RN,  December 12, 2009 11:31 AM  Additional Follow-up for Phone Call Additional follow up Details #1::        told her we still do not have a copy of the test results. asked her to call Dr. Carolyn Stare back & ask for the results & instruct them to fax a copy here. states she will Additional Follow-up by: Golden Circle RN,  December 12, 2009 12:07 PM

## 2010-10-02 NOTE — Assessment & Plan Note (Signed)
Summary: rov 6 months///kp   Primary Provider/Referring Provider:  Denny Levy MD  CC:  6 month follow up visit-allergies..  History of Present Illness:  August 07, 2009- Allergic rhinits, OSA At last pressure check in August She had good compliance with 86% of days used over 4 hours. Despite humidifier it dries her nose  with winter heat. CPAP 12 gave AHI 8.7. She agrees to try a step up. Allergy vaccine continues here at 1:10, doing well. She gives herself a neb when needed. Did get flu vax Little routine wheeze or cough. Little sinus congestion or sneeze.  December 05, 2009- Allergic rhinitis, OSA CPAP- Nasal pillows mask sometimes irritate her nose and she will skip for the night. Pressure is ok and she admits she sleeps better when she wears it. Pressure 12/ [employer]. Wears it over 75% of nights at least 4 hours. Allergy- Still easy dyspnea with activity, showering etc. Little change day-to-day. Some dry cough and light wheeze several days per week. Singulair works, nasal spray used when needed. Zyrtec overdries and is used little. Gets her allergy vaccine here without problems.  July 12, 2010- Allergic rhinitis ,Asthma, OSA Nurse-CC: 6 month follow up visit-allergies. Somedays still notes chest tightness and wheeze. Notes waking with wheeze and nasal congestion especially if weather cold or rainey. Uses Qvar daily. Neb Brovana about 3x/ week, Proair 1-2x/ week. Rarely awakened in night by breathing. She is convinced allergy shots have helped her. Feeling stable with vaccine at 1:10 and would like to extend interval now to qow.  Sleep apnea- uses CPAP only if really tired. Sleeps alone- no reporter. . She suggests we retest her. Has to sleep with CPAP and also with arm brace and feels this is sometimes too much equipment to fool with. Bedtime 10-11PM, latency 15-30 minutes, up 630-730AM.     Preventive Screening-Counseling & Management  Alcohol-Tobacco     Smoking Status:  never     Tobacco Counseling: not indicated; no tobacco use  Current Medications (verified): 1)  Lantus Solostar 100 Unit/ml Soln (Insulin Glargine) .... 40units Subcutaneously Daily Disp Qs 1 M in Solostar Pen This Replaces 70-30 Mix 2)  Metformin Hcl 1000 Mg Tabs (Metformin Hcl) .... Take 1.5  Tablets By Mouth in in The Am and 1 Tablets in The Pm 3)  Potassium Chloride 40 Meq/18ml (20%) Liqd (Potassium Chloride) .[name].. 15 Ml Once Daily 4)  Hyzaar 100-25 Mg  Tabs (Losartan Potassium-Hctz) .[name].. 1 By Mouth Once Daily 5)  Metoprolol Succinate 50 Mg Xr24h-Tab (Metoprolol Succinate) .... Take One Tablet Two Times A Day 6)  Lasix 20 Mg Tabs (Furosemide) .... Take 1 Tablet By Mouth Daily 7)  Simvastatin 40 Mg Tabs (Simvastatin) .[name].. 1 By Mouth Once Daily 8)  Nexium 40 Mg Cpdr (Esomeprazole Magnesium) .[name].. 1 Capsule By Mouth Twice A Day 9)  Acyclovir 400 Mg Tabs (Acyclovir) .... Take 1 By Mouth Two Times A Day 10)  Lancets For Glucometer Testing 11)  Brovana 15 Mcg/27ml Nebu (Arformoterol Tartrate) .[name].. 1 Neb Every Twice Daily If Needed 12)  Bayer Aspirin 325 Mg Tabs (Aspirin) .... Take 1 Tablet By Mouth Once A Day 13)  Flexfot Cpap Mask 407 14)  Qvar 80 Mcg/act  Aers (Beclomethasone Dipropionate) .... 2 Puffs Twice Daily 15)  Xalatan 0.005 %  Soln (Latanoprost) .[name].. 1 Drop Each Eye At Bedtime 16)  Cpap 12 Advanced 17)  Allergy Vaccine Gh 1:10 18)  Alphagan P 0.1 % Soln (Brimonidine Tartrate) .Marland KitchenMarland KitchenMarland Kitchen 1  Drop in Left Eye Two Times A Day 19)  Astepro 137 Mcg/spray Soln (Azelastine Hcl) .... 2 Sprays Daily As Needed 20)  Singulair 10 Mg Tabs (Montelukast Sodium) .... One By Mouth Qd 21)  Zoloft 100 Mg Tabs (Sertraline Hcl) .[name].. 1 1/2 Tabs By Mouth Qd 22)  Allopurinol 300 Mg Tabs (Allopurinol) .[name].. 1 By Mouth Qd 23)  Diabetic Shoes .... Patient With Peripheral Neuropathy and Loss of Sensation in Feet, Some Callouses 24)  Fluconazole 100 Mg Tabs (Fluconazole) .[name].. 1 By Mouth Every Other Day For Three  Doses 25)  Lyrica 50 Mg Caps (Pregabalin) .... Two By Mouth Am, One At Mid Day and Two By Mouth Qhs120 26)  Nortriptyline Hcl 25 Mg Caps (Nortriptyline Hcl) .... One By Mouth Q Hs  Allergies (verified): 1)  Darvon 2)  Diovan (Valsartan) 3)  Flagyl (Metronidazole) 4)  Norvasc (Amlodipine Besylate) 5)  Prinzide  Past History:  Past Surgical History: Last updated: 05/31/2010 Anal fistula repair -,  Arthroscopy--R shoulder/R knee 12/01 - 10/07/2000,  breast reduction - 04/21/2003,  BTL--`87 -,  cardiac Cath--nl ef, no cad - 10/04/2003, echo--tricus regurg, mild pul htn, ef 60 - 03/02/2004,  ENT Naso-pharyn scope-normal - 09/11/2006,  Hysterectomy - 10/07/2000, l . rotator cuff surgery - 10/18/2003,  T-score: -.0.54 (low risk currently) - 09/02/2004 bronchoscopy for chronic cough 07/2007 Release of volar carpal ligament-2011  Family History: Last updated: 10/30/2006 Father-, financial difficulties--receives meds from [facility] [phone]), Mohter-diabetes, no family hx colon ca  Social History: Last updated: 05/02/2010 lives alone.   approved for section 8 housing (12/03) Patient states former smoker.   Risk Factors: Exercise: yes (07/23/2007)  Risk Factors: Smoking Status: never (07/12/2010)  Past Medical History: chronic cough--now followed by pulmonary moderate sleep apnea--cpap 12 Hypertension Dyslipidemia Diabetes Gout  ?hx vocal cord paresis normal 2008  Review of Systems      See HPI  The patient denies shortness of breath with activity, shortness of breath at rest, productive cough, non-productive cough, coughing up blood, chest pain, irregular heartbeats, acid heartburn, indigestion, loss of appetite, weight change, abdominal pain, difficulty swallowing, sore throat, tooth/dental problems, headaches, nasal congestion/difficulty breathing through nose, and sneezing.    Vital Signs:  Patient profile:   60 year old female Height:      61 inches Weight:      186.50  pounds BMI:     35.37 O2 Sat:      97 % on Room air Pulse rate:   68 / minute BP sitting:   142 / 72  (right arm) Cuff size:   regular  Vitals Entered By: Reynaldo Minium CMA (July 12, 2010 11:02 AM)  O2 Flow:  Room air CC: 6 month follow up visit-allergies.   Physical Exam  Additional Exam:  General: A/Ox3; pleasant and cooperative, NAD, alert, overweight, sat 100% room air at rest SKIN: no rash, lesions NODES: no lymphadenopathy HEENT: [place]/AT, EOM- strabismus, Conjuctivae- clear, PERRLA, limitied eye sight, squints with right, TM-WNL, Nose- clear, Throat- clear and wnl, .Mallampati  II, tongue stained yellow by something she was drinking. NECK: Supple w/ fair ROM, JVD- none, normal carotid impulses w/o bruits Thyroid- normal to palpation CHEST: Clear to P&A, no wheeze or cough today HEART: RRR, 2/6 syst murmur aortic space ABDOMEN: Soft  ZHY:QMVH, nl pulses, no edema  NEURO: Grossly intact to observation      Impression & Recommendations:  Problem # 1:  OBSTRUCTIVE SLEEP APNEA (ICD-327.23)  Not compliant with CPAP. Long palate and overweight,  favoring liklihood she has enough OSA to matter. I will follow her lead as discussed and update sleep study.   Problem # 2:  ALLERGIC  RHINITIS (ICD-477.9)  She has felt pleased with allergy vaccine. We can increase the interval to every other week, getting injections here in allergy lab.  The following medications were removed from the medication list:    Zyrtec Allergy 10 Mg Tabs (Cetirizine hcl) .[name]... 1 by mouth at bedtime on hold per dr young Her updated medication list for this problem includes:    Astepro 137 Mcg/spray Soln (Azelastine hcl) .[name]... 2 sprays daily as needed  Problem # 3:  ? of ASTHMA, INTERMITTENT (ICD-493.90) She has hx of cough, not demonstrated today, and remains on SABA and ICS meds. Lung exam is clear. I will get PFT.   Medications Added to Medication List This Visit: 1)  Acyclovir 400 Mg Tabs  (Acyclovir) .... Take 1 by mouth two times a day 2)  Allergy Vaccine Gh 1:10  .... Every other week 3)  Alphagan P 0.1 % Soln (Brimonidine tartrate) .[name].. 1 drop in left eye two times a day  Other Orders: Est. Patient Level IV (60454) Misc. Referral (Misc. Ref) Sleep Disorder Referral (Sleep Disorder)  Patient Instructions: 1)  Please schedule a follow-up appointment in 1 month. 2)  See [facility] to schedule sleep study and PFT 3)  I will let the allergy lab know to give your allergy shot every other week.

## 2010-10-02 NOTE — Progress Notes (Signed)
Summary: phn msg  Call back at Home Phone [phone]   Caller: Patient Summary of Call: pain mgmt info was mailed to Dr Jennette Kettle and would like to have a call when rec'd hands are still very uncomfortable Initial call taken by: De Nurse,  December 13, 2009 10:10 AM  Follow-up for Phone Call        [name] [name] would like for you to contact her asap the moment you receives the results of that nerve test she took.  She has been calling repeatedly for the results.   She is still having pain to her hand/arm Follow-up by: Abundio Miu    Additional Follow-up for Phone Call Additional follow up Details #2::    DEAR [name] [name] TEAM OK I finally saw the results--ALL it did was tell me she has nerve impingement at the left wrist and at the left elbow which correlates w her signs--and she has mild carpal tunnel on right. THEIR suggestion was to try splinting or injections--whcih we have tried. So tell her I am sorry, they had no new bright ideas.  The only option I see is to try and refer her to [facility] or [facility] for eval for a surgery--but I am not sure she wants to pursue that. I would be happy to do referral if she wants let me know Thanks!  Denny Levy MD  December 19, 2009 3:19 PM   Additional Follow-up for Phone Call Additional follow up Details #3:: Details for Additional Follow-up Action Taken: spoke with pt, explained test results, voiced understand, explained fom last visit Dr stated for her to sched apt in 6 mos. Additional Follow-up by: Gladstone Pih,  December 19, 2009 3:25 PM

## 2010-10-02 NOTE — Letter (Signed)
Summary: LAB Letter  [facility]  [address]   [place], [place] [address]   Phone: [phone]  Fax: [phone]    10/12/2009  [name] [name] [address] [name], [place]  [address]  Dear [name],  All of your lab work including thyroid, blood sugar, kidney function, electrolytes was normal. Great!         Sincerely,   Denny Levy MD  Appended Document: LAB Letter mailed.

## 2010-10-02 NOTE — Progress Notes (Signed)
Summary: triage  Phone Note Call from Patient Call back at Home Phone [phone]   Caller: Patient Summary of Call: Needs to know when she needs her next [facility]. Initial call taken by: Clydell Hakim,  September 27, 2009 4:27 PM  Follow-up for Phone Call        made her an appt with pcp 10/11/09 Follow-up by: Golden Circle RN,  September 27, 2009 4:27 PM

## 2010-10-02 NOTE — Progress Notes (Signed)
Summary: LAB RESULTS   Phone Note Call from Patient   Caller: Patient Call For: Bird Swetz Summary of Call: PT WANTS LAB RESULTS. [phone]. PATIENT'S CHART HAS BEEN REQUESTED Initial call taken by: Tivis Ringer,  September 15, 2007 2:44 PM  Follow-up for Phone Call        Phone Call Completed. CBC- mild leukocytosis, mild anemia RAST- insignificant - watch symptoms as spring starts. Emphasize swallowing/aspiration precautions - hx vocal cord paresis / botox. Will keep apt. Follow-up by: Waymon Budge MD,  September 22, 2007 3:50 PM

## 2010-10-02 NOTE — Assessment & Plan Note (Signed)
Summary: F/U Diabetes Rx Clinic   Vital Signs:  Patient Profile:   60 Years Old Female Height:     61.5 inches (156.21 cm) Weight:      190 pounds BMI:     35.45 Pulse rate:   65 / minute BP sitting:   107 / 66  (left arm)                  Diabetes Management History:      The patient is a 60 years old female who comes in for evaluation of DM Type 2.  She is (or has been) enrolled in the "Diabetic Education Program".  She is checking home blood sugars.  She says that she is exercising.  Type of exercise includes: biking.  Duration of exercise is estimated to be 15 min.  She is doing this 3 times per week.        Hypoglycemic symptoms are not occurring.        Her home fasting blood sugars are as follows: average: 130.  Her 4:00 PM blood sugars reveal: highest: 304; lowest: 77; average: 150.       Current Allergies: DARVON (PROPOXYPHENE HCL) DIOVAN (VALSARTAN) FLAGYL (METRONIDAZOLE) METOPROLOL TARTRATE (METOPROLOL TARTRATE) NORVASC (AMLODIPINE BESYLATE) PRINZIDE (LISINOPRIL-HYDROCHLOROTHIAZIDE)        Impression & Recommendations:  Problem # 1:  DIABETES MELLITUS II, UNCOMPLICATED (ICD-250.00) Diabetes of long-standing duration currently under: excellent control of blood glucose based on A1C of 6.8.  Fasting CBGs of: 130 and random readings of: 150 Control is suboptimal due to: dietary indiscretion and minimal exercise.  Denies hypoglycemic events.  Able to verbalize appropriate hypoglycemia management plan. Continued insulin regimen at this time. Written plan provided:   F/U Rx Clinic Visit:  8-12 weeks.  Next F/U with Dr. Jennette Kettle in 4-6 weeks.   TTFFC: 40 mins.  Pt seen with: Jamie Brookes, MD,  Eden Lathe, PharmD Candidate and Weston Brass PharmD  Obtained CMET today.  Her updated medication list for this problem includes:    Lantus Solostar 100 Unit/ml Soln (Insulin glargine) .[name]... As directed 32 units each morning disp 1 month or can dispense three months as patient  wishes    Hyzaar 100-25 Mg Tabs (Losartan potassium-hctz) .[name]... 1 by mouth once daily    Metformin Hcl 1000 Mg Tabs (Metformin hcl) .[name]... Take 1.5  tablets by mouth in in the am and 1 tablets in the pm    Bayer Aspirin 325 Mg Tabs (Aspirin) .[name]... Take 1 tablet by mouth once a day    Novolog Flexpen 100 Unit/ml Soln (Insulin aspart) .[name]... 12 units prior to breakfast 12 units prior to evening meal supply one box of pens. dispense one or three months supply as patient wishes  Orders: A1C-[facility] (78469) Comp Met-[facility] ([id number]) [id number]   Complete Medication List: 1)  Lantus Solostar 100 Unit/ml Soln (Insulin glargine) .... As directed 32 units each morning disp 1 month or can dispense three months as patient wishes 2)  Hyzaar 100-25 Mg Tabs (Losartan potassium-hctz) .[name].. 1 by mouth once daily 3)  Metoprolol Succinate 25 Mg Tb24 (Metoprolol succinate) .... Take one tab  two times a day 4)  Clonidine Hcl 0.1 Mg Tabs (Clonidine hcl) .[name].. 1 tablet by mouth twice a day 5)  Crestor 20 Mg Tabs (Rosuvastatin calcium) .... Take 1 tablet by mouth once a day 6)  Lasix 20 Mg Tabs (Furosemide) .... Take 1 tablet by mouth twice a day 7)  Metformin Hcl 1000 Mg Tabs (Metformin hcl) .... Take 1.5  tablets by mouth in in the am and 1 tablets in the pm 8)  Nexium 40 Mg Cpdr (Esomeprazole magnesium) .[name].. 1 capsule by mouth twice a day 9)  Singulair 10 Mg Tabs (Montelukast sodium) .... Take 1 tablet by mouth once a day 10)  Sucralfate 1 Gm Tabs (Sucralfate) .... Take 1 tablet by mouth four times a day 11)  Nitroglycerin 0.4 Mg Subl (Nitroglycerin) .... Place 1 tablet under tongue as directed 12)  Acyclovir 800 Mg Tabs (Acyclovir) .... Take 1 tabletb id/qd as directed 13)  Albuterol Sulfate 1.25 Mg/95ml Nebu (Albuterol sulfate) .... Inhale 1 vial as directed twice a day 14)  Bayer Aspirin 325 Mg Tabs (Aspirin) .... Take 1 tablet by mouth once a day 15)  Flexfot Cpap Mask 407  16)   Lancets For Glucometer Testing  17)  Novolog Flexpen 100 Unit/ml Soln (Insulin aspart) .[name].. 12 units prior to breakfast 12 units prior to evening meal supply one box of pens. dispense one or three months supply as patient wishes 18)  Zyrtec Allergy 10 Mg Tabs (Cetirizine hcl) .[name].. 1 by mouth at bedtime 19)  Qvar 80 Mcg/act Aers (Beclomethasone dipropionate) .... 2 puffs twice daily 20)  Xalatan 0.005 % Soln (Latanoprost) .[name].. 1 drop each eye at bedtime 21)  Tessalon 200 Mg Caps (Benzonatate) .[name].. 1 by mouth three times a day as needed cough 22)  Cpap 12 Advanced  23)  Allergy Vaccine Gh 1:50  24)  Allergy Vaccine Gh Advance To 1:10 Next Order  .... Build per protocol 25)  Trusopt 2 % Soln (Dorzolamide hcl) .[name].. 1 drop in left eye twice daily 26)  Astelin 137 Mcg/spray Soln (Azelastine hcl) .... 2 sprays each nostril twice daily 27)  Proair Hfa 108 (90 Base) Mcg/act Aers (Albuterol sulfate) .... 2 sprays twice a day and as needed for shortness of breath  Diabetes Management Assessment/Plan:      The following lipid goals have been established for the patient: Total cholesterol goal of 200; LDL cholesterol goal of 100; HDL cholesterol goal of 40; Triglyceride goal of 200.     Patient Instructions: 1)  Diabetes Control is at goal of less than 7 2)  If you would like to lower your blood glucose further PLEASE try to walk on a more regular schedule of 15-20 minutes MOST days of the week.  3)  No change in Diabetes medications at this time. 4)  Schedule Next Follow-up with Dr. Jennette Kettle in 4-6 weeks (mid-October). 5)  Plan return to Pharmacy Clinic 4-6 weeks after visit with Dr. Jennette Kettle (Late Novemer or Early December).   Prescriptions: PROAIR HFA 108 (90 BASE) MCG/ACT AERS (ALBUTEROL SULFATE) 2 sprays twice a day and as needed for shortness of breath  #1 x 0   Entered and Authorized by:   Madelon Lips PHARMD   Signed by:   Madelon Lips PHARMD on 04/28/2008   Method used:   Historical   RxID:    [id number] ASTELIN 137 MCG/SPRAY SOLN (AZELASTINE HCL) 2 sprays each nostril twice daily  #1 x 0   Entered and Authorized by:   Madelon Lips PHARMD   Signed by:   Madelon Lips PHARMD on 04/28/2008   Method used:   Historical   RxID:   [id number] TRUSOPT 2 % SOLN (DORZOLAMIDE HCL) 1 drop in left eye twice daily  #1 x 0   Entered and Authorized by:   Madelon Lips PHARMD  Signed by:   Madelon Lips PHARMD on 04/28/2008   Method used:   Historical   RxID:   [id number]  ]  Appended Document: A1c report     Lab Visit   Laboratory Results   Blood Tests   Date/Time Received: April 28, 2008 10:24 AM  Date/Time Reported: April 28, 2008 12:14 PM   HGBA1C: 6.8%   (Normal Range: Non-Diabetic - 3-6%   Control Diabetic - 6-8%)  Comments: ............test performed by...........[name] Terese Door, CMA .............entered by...........Marland KitchenBonnie A. [place], [place] (ASCP)      Orders Today:

## 2010-10-02 NOTE — Progress Notes (Signed)
Summary: phn msg  Phone Note Call from Patient Call back at Home Phone [phone]   Caller: Patient Summary of Call: Pt is asking about referral to Ortho Initial call taken by: De Nurse,  Jan 04, 2010 4:25 PM  Follow-up for Phone Call         patient notified of appointment with Dr. Hilda Lias in [place] for 01/11/2010. Follow-up by: Theresia Lo RN,  Jan 04, 2010 4:56 PM

## 2010-10-02 NOTE — Progress Notes (Signed)
Summary: pt wants lab results  Phone Note Call from Patient Call back at Home Phone [phone]   Caller: Patient Reason for Call: Talk to Nurse, Talk to Doctor, Lab or Test Results Summary of Call: pt would like results of lab work if no answer just leave it on voice mail Initial call taken by: Omer Jack,  June 04, 2010 4:27 PM  Follow-up for Phone Call        [facility] with results of last bmet.  Layne Benton, RN, BSN  June 04, 2010 6:27 PM

## 2010-10-02 NOTE — Assessment & Plan Note (Signed)
Summary: LEFT FOOT/KH   Vital Signs:  Patient profile:   60 year old female BP sitting:   108 / 68  Vitals Entered By: Lillia Pauls CMA (August 14, 2009 2:16 PM)  Primary Care Provider:  Denny Levy MD   History of Present Illness: f/u B foot pain. Worse with extended standing or walking. Right foot bothering her more most recently. no specific injury--has had gout but no foot surgeries  Allergies: 1)  Darvon (Propoxyphene Hcl) 2)  Diovan (Valsartan) 3)  Flagyl (Metronidazole) 4)  Norvasc (Amlodipine Besylate) 5)  Prinzide (Lisinopril-Hydrochlorothiazide)  Physical Exam  Additional Exam:  B pes planus. Right midfoot shift during single leg stance phase of gait. Gait a bit wide based Sensory exam reveals decreased sensation to soft touch B feet. Vascular exam--normal pulses and cap refill. SKIN exam--nop lesions, very little callous. Patient had custom molded prthotics made using a neutral subtalar foot position. The orthotic was then placed on a black-- base abd trimmed accordingly.  medial forefoot posting on left foot with some lateral heel postingon left foot.   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 Face to face time 45 minutes  Orders: [employer], each unit (L3002)  Complete Medication List: 1)  Novolog Mix 70/30 Flexpen 70-30 % Susp (Insulin aspart prot & aspart) .... 28 units prior and breakfast and 28 units prior to evening meal.  dispense one month supply. 2)  Metformin Hcl 1000 Mg Tabs (Metformin hcl) .... Take 1.5  tablets by mouth in in the am and 1 tablets in the pm 3)  Potassium  Chloride Crys Cr 20 Meq Cr-tabs (Potassium chloride crys cr) .... 2 in the am and 1 in the pm 4)  Hyzaar 100-25 Mg Tabs (Losartan potassium-hctz) .[name].. 1 by mouth once daily 5)  Lopressor 50 Mg Tabs (Metoprolol tartrate) .... Two times a day 6)  Clonidine Hcl 0.1 Mg Tabs (Clonidine hcl) .[name].. 1 tablet by mouth twice a day 7)  Lasix 20 Mg Tabs (Furosemide) .... Take 1 tablet by mouth twice a day 8)  Simvastatin 40 Mg Tabs (Simvastatin) .[name].. 1 by mouth once daily 9)  Nexium 40 Mg Cpdr (Esomeprazole magnesium) .[name].. 1 capsule by mouth twice a day 10)  Acyclovir 800 Mg Tabs (Acyclovir) .... Take 1 tabletb id/qd as directed 11)  Lancets For Glucometer Testing  12)  Brovana 15 Mcg/7ml Nebu (Arformoterol tartrate) .[name].. 1 neb every twice daily if needed 13)  Bayer Aspirin 325 Mg Tabs (Aspirin) .... Take 1 tablet by mouth once a day 14)  Flexfot Cpap Mask 407  15)  Zyrtec Allergy 10 Mg Tabs (Cetirizine hcl) .[name].. 1 by mouth at bedtime 16)  Qvar 80 Mcg/act Aers (Beclomethasone dipropionate) .... 2 puffs twice daily 17)  Xalatan 0.005 % Soln (Latanoprost) .[name].. 1 drop each eye at bedtime 18)  Cpap 12 Advanced  19)  Allergy Vaccine Gh 1:50  20)  Trusopt 2 % Soln (Dorzolamide hcl) .[name].. 1 drop in left eye twice daily 21)  Astepro 137 Mcg/spray Soln (Azelastine hcl) .... 2 sprays daily as needed 22)  Singulair 10 Mg Tabs (Montelukast sodium) .... One by mouth qd 23)  Zoloft 100 Mg  Tabs (Sertraline hcl) .[name].. 1 1/2 tabs by mouth qd 24)  Allopurinol 300 Mg Tabs (Allopurinol) .[name].. 1 tablet every day start on sept.2nd 25)  Gabapentin 300 Mg Caps (Gabapentin) .[name].. 1 by mouth bid

## 2010-10-02 NOTE — Miscellaneous (Signed)
  Clinical Lists Changes RN TEAM can u contact a home helath agency and see if [place] will go out and do an eval for any services they can offer her. Specifically she needs someone to 1) fill her pill box 2) put a needle on her flex pen for insulin--- ideally she needs someone daily for next 4-6 weeks as she just had hand surgery and has BAD peripheral neuropathy so she CANNOT give herself insulin shots  ALSO can u call her and tell her I am changing her insulin from 28 units of the mix twice a day to 40 units of ONCE  a day lantus. i have called the rx in---I am going to use a flex pen for this as well. ALSo her insurance is [employer]--whichis the one we had so much troupble with before--so this maybe difficult--let me know how I can help  THIRDLY and LASTLY--can u ask a personal care service to go to her house and evaluate whatthey can do for her?  Mucho Mucho Thanks  Denny Levy MD  May 03, 2010 9:30 AM    contacted [facility] and was told that  medicare  will not pay for nurse to  go out  for medication management. she has to require  skilled nursing . also contaced another Home Heath agency , [employer] , and any personal care services or help with medications will not be covered by her medicare . she would have to pay out of pocket. patient notified and suggetsed she ask her church or pastor if they can provide her with any assistance.  patient states she will see what she can do .    advised patient about change  of insulin . that she will now be on Lantus once daily . she will take 40 units daily. she voices understanding . states she was on lantus in the past.   patient reports yesterday after leaving office she was hanging out clothes and felt very weak and hardly made it to the bathroom . she had very loose stool  and then later had 2 other episodes of loose stools.  she started with pain in left back and abdomen which has continued today. , no fever or vomting, states the pain  affects  the way she walks. she is wondering if she needs an xray.. states she has had similar discomfort in the past. will consult with Dr. Jennette Kettle. Theresia Lo RN  May 03, 2010 11:48 AM  spoke with   Dr.  Jennette Kettle. she advises it is possible that she has a bug that has caused loose stools. she did have CT a few months ago that did show any indication for pain.  patient feels she needs xray now to make sure nothing new is going on . Dr Jennette Kettle states she will order xray.   Theresia Lo RN  May 03, 2010 12:13 PM

## 2010-10-02 NOTE — Progress Notes (Signed)
Summary: Test strips   Phone Note Call from Patient Call back at Home Phone [phone]   Summary of Call: Pt is requesting to speak with someone regarding her test strips. Initial call taken by: Haydee Salter,  July 22, 2007 12:20 PM  Follow-up for Phone Call        asked pt to have her dm supply company fax a request to [place] Follow-up by: Golden Circle RN,  July 22, 2007 2:56 PM

## 2010-10-02 NOTE — Assessment & Plan Note (Signed)
Summary: FOLLOW UP/ MBW  Medications Added REGLAN 10 MG TABS (METOCLOPRAMIDE HCL) 1/2 three times a day with meals and 1 at bedtime ZYRTEC ALLERGY 10 MG  TABS (CETIRIZINE HCL) 1 by mouth at bedtime        PCP:  Denny Levy MD  Chief Complaint:  persistant dry cough from last ov with Dr. Vassie Loll.  History of Present Illness: Patient has a history of chronic cough for last   20 years with extensive work up including previous   ENT exam has suggested laryngopharyngeal reflux, Chest CT in 3/08  wiht no  evidence of fibrosis. Spirometry has shown restriction without any evidence of obstruction.  Bronchoscopy with transbronchial biopsies have been nondiagnostic. Mulltiple cough suppression regimens with antihistamines, mucolytics, cough suppressants with no significant resolution. She returns today with no significant changs in cough.      Current Allergies (reviewed today): DARVON (PROPOXYPHENE HCL) DIOVAN (VALSARTAN) FLAGYL (METRONIDAZOLE) METOPROLOL TARTRATE (METOPROLOL TARTRATE) NORVASC (AMLODIPINE BESYLATE) PRINZIDE (LISINOPRIL-HYDROCHLOROTHIAZIDE) Updated/Current Medications (including changes made in today's visit):  LANTUS SOLOSTAR 100 UNIT/ML  SOLN (INSULIN GLARGINE) as directed 20 units each morning HYZAAR 100-25 MG  TABS (LOSARTAN POTASSIUM-HCTZ) 1 by mouth once daily METOPROLOL SUCCINATE 25 MG TB24 (METOPROLOL SUCCINATE) take one tab  two times a day CLONIDINE HCL 0.1 MG TABS (CLONIDINE HCL) 1 tablet by mouth twice a day CRESTOR 20 MG TABS (ROSUVASTATIN CALCIUM) Take 1 tablet by mouth once a day LASIX 20 MG TABS (FUROSEMIDE) Take 1 tablet by mouth twice a day METFORMIN HCL 1000 MG TABS (METFORMIN HCL) Take 1 tablet by mouth twice a day NEXIUM 40 MG CPDR (ESOMEPRAZOLE MAGNESIUM) 1 capsule by mouth twice a day REGLAN 10 MG TABS (METOCLOPRAMIDE HCL) 1/2 three times a day with meals and 1 at bedtime SINGULAIR 10 MG TABS (MONTELUKAST SODIUM) Take 1 tablet by mouth once a day SPIRIVA  HANDIHALER 18 MCG CAPS (TIOTROPIUM BROMIDE MONOHYDRATE) Inhale 1 capsule by mouth once a day SUCRALFATE 1 GM TABS (SUCRALFATE) Take 1 tablet by mouth four times a day NITROGLYCERIN 0.4 MG SUBL (NITROGLYCERIN) Place 1 tablet under tongue as directed ACYCLOVIR 800 MG TABS (ACYCLOVIR) Take 1 tablet daily ALBUTEROL SULFATE 1.25 MG/3ML NEBU (ALBUTEROL SULFATE) Inhale 1 vial as directed twice a day BAYER ASPIRIN 325 MG TABS (ASPIRIN) Take 1 tablet by mouth once a day PROVENTIL HFA 108 (90 BASE) MCG/ACT AERS (ALBUTEROL SULFATE) Inhale 2 puff using inhaler every four hours * FLEXFOT CPAP MASK 407  * LANCETS FOR GLUCOMETER TESTING  NOVOLOG FLEXPEN 100 UNIT/ML  SOLN (INSULIN ASPART) 6 units prior to Breakfast 8 units prior to Evening Meal Supply one box of pens POTASSIUM CHLORIDE 20 MEQ  PACK (POTASSIUM CHLORIDE) one daily ZYRTEC ALLERGY 10 MG  TABS (CETIRIZINE HCL) 1 by mouth at bedtime      Review of Systems      See HPI   Vital Signs:  Patient Profile:   60 Years Old Female Height:     61.5 inches (156.21 cm) Weight:      188 pounds O2 Sat:      98 % O2 treatment:    Room Air Temp:     97.5 degrees F oral Pulse rate:   92 / minute BP sitting:   100 / 50  (left arm) Cuff size:   regular  Vitals Entered By: Boone Master CNA (September 10, 2007 10:33 AM)             Is Patient Diabetic? Yes  Comments Medications  reviewed with patient ..................................................................Marland KitchenBoone Master CNA  September 10, 2007 10:34 AM      Physical Exam  No acute distress. VSS.  HEENT: Nasal mucosa pale.  Oropharynx is clear. TM nml, EAC clear Neck: supple, no adenopathy, or JVD Lungs: CTA bilat. no wheezing/crackles Card: r/r/r, no m/r/g Abd: soft, nontender Ext: warm w/o calf tenderness, cyanosis clubbing, or trace  edema.       Problem # 1:  COUGH (ICD-786.2) Continue cough suppression regimen. Increase reglan 10mg  1/2 three times a day and 1 at bedtime for  possible LPR trigger. follow up in 3 months and as needed. Please contact office for sooner follow up if symptoms do not improve or worsen   Problem # 2:  ALLERGIC  RHINITIS (ICD-477.9) Add zyrtec 10mg  at bedtime. saline nasal as needed Please contact office for sooner follow up if symptoms do not improve or worsen  Her updated medication list for this problem includes:    Zyrtec Allergy 10 Mg Tabs (Cetirizine hcl) .[name]... 1 by mouth at bedtime   Problem # 3:  DIABETES MELLITUS II, UNCOMPLICATED (ICD-250.00)  Medications Added to Medication List This Visit: 1)  Reglan 10 Mg Tabs (Metoclopramide hcl) .... 1/2 three times a day with meals and 1 at bedtime 2)  Zyrtec Allergy 10 Mg Tabs (Cetirizine hcl) .[name].. 1 by mouth at bedtime   Patient Instructions: 1)  Please schedule a follow-up appointment in 3 months Dr. Vassie Loll  2)  Please contact office for sooner follow up if symptoms do not improve or worsen  3)  Increase Reglan 10mg  to 1/2 tab before meals three times a day  4)   and 1 at bedtime  5)  Zyrtec 10mg  at bedtime.    Prescriptions: ZYRTEC ALLERGY 10 MG  TABS (CETIRIZINE HCL) 1 by mouth at bedtime  #30 x 5   Entered and Authorized by:   Rubye Oaks NP   Signed by:   Ileanna Gemmill NP on 09/10/2007   Method used:   Printed then faxed to ...       [facility]       [address]       [place], [place]  [address]       Ph: [phone]       Fax: [phone]   RxID:   [id number] REGLAN 10 MG TABS (METOCLOPRAMIDE HCL) 1/2 three times a day with meals and 1 at bedtime  #100 x 5   Entered and Authorized by:   Rubye Oaks NP   Signed by:   Kattia Selley NP on 09/10/2007   Method used:   Printed then faxed to ...       [facility]       [address]       [place], [place]  [address]       Ph: [phone]       Fax: [phone]   RxID:   [id number]  ]   Appended Document: Orders Update     Clinical  Lists Changes  Orders: Added new Service order of Est. Patient Level III (06301) - Signed

## 2010-10-02 NOTE — Miscellaneous (Signed)
Summary: Consent for Joint Aspiration  Consent for Joint Aspiration   Imported By: Clydell Hakim 05/02/2009 16:14:09  _____________________________________________________________________  External Attachment:    Type:   Image     Comment:   External Document

## 2010-10-02 NOTE — Assessment & Plan Note (Signed)
Summary: f/u  kh   Vital Signs:  Patient profile:   60 year old female Weight:      181 pounds Temp:     98.3 degrees F oral Pulse rate:   59 / minute Pulse rhythm:   regular BP sitting:   155 / 80  (right arm) Cuff size:   regular  Vitals Entered By: Loralee Pacas CMA (June 20, 2010 11:11 AM) CC: follow-up visit Comments pt inquiring about metformin   CC:  follow-up visit.  Habits & Providers  Alcohol-Tobacco-Diet     Tobacco Status: never     Tobacco Counseling: not indicated; no tobacco use  Current Medications (verified): 1)  Lantus Solostar 100 Unit/ml Soln (Insulin Glargine) .... 40units Subcutaneously Daily Disp Qs 1 M in Solostar Pen This Replaces 70-30 Mix 2)  Metformin Hcl 1000 Mg Tabs (Metformin Hcl) .... Take 1.5  Tablets By Mouth in in The Am and 1 Tablets in The Pm 3)  Potassium Chloride 40 Meq/33ml (20%) Liqd (Potassium Chloride) .[name].. 15 Ml Once Daily 4)  Hyzaar 100-25 Mg  Tabs (Losartan Potassium-Hctz) .[name].. 1 By Mouth Once Daily 5)  Metoprolol Succinate 50 Mg Xr24h-Tab (Metoprolol Succinate) .... Take One Tablet Two Times A Day 6)  Lasix 20 Mg Tabs (Furosemide) .... Take 1 Tablet By Mouth Daily 7)  Simvastatin 40 Mg Tabs (Simvastatin) .[name].. 1 By Mouth Once Daily 8)  Nexium 40 Mg Cpdr (Esomeprazole Magnesium) .[name].. 1 Capsule By Mouth Twice A Day 9)  Acyclovir 800 Mg Tabs (Acyclovir) .... Take 1 Tabletb Id/qd As Directed 10)  Lancets For Glucometer Testing 11)  Brovana 15 Mcg/63ml Nebu (Arformoterol Tartrate) .[name].. 1 Neb Every Twice Daily If Needed 12)  Bayer Aspirin 325 Mg Tabs (Aspirin) .... Take 1 Tablet By Mouth Once A Day 13)  Flexfot Cpap Mask 407 14)  Zyrtec Allergy 10 Mg  Tabs (Cetirizine Hcl) .[name].. 1 By Mouth At Bedtime On Hold Per Dr Maple Hudson 15)  Qvar 80 Mcg/act  Aers (Beclomethasone Dipropionate) .... 2 Puffs Twice Daily 16)  Xalatan 0.005 %  Soln (Latanoprost) .[name].. 1 Drop Each Eye At Bedtime 17)  Cpap 12 Advanced 18)  Allergy Vaccine Gh 1:10 19)   Trusopt 2 % Soln (Dorzolamide Hcl) .[name].. 1 Drop in Left Eye Twice Daily 20)  Astepro 137 Mcg/spray Soln (Azelastine Hcl) .... 2 Sprays Daily As Needed 21)  Singulair 10 Mg Tabs (Montelukast Sodium) .... One By Mouth Qd 22)  Zoloft 100 Mg Tabs (Sertraline Hcl) .[name].. 1 1/2 Tabs By Mouth Qd 23)  Allopurinol 300 Mg Tabs (Allopurinol) .[name].. 1 By Mouth Qd 24)  Diabetic Shoes .... Patient With Peripheral Neuropathy and Loss of Sensation in Feet, Some Callouses 25)  Fluconazole 100 Mg Tabs (Fluconazole) .[name].. 1 By Mouth Every Other Day For Three Doses 26)  Lyrica 50 Mg Caps (Pregabalin) .... Two By Mouth Am, One At Mid Day and Two By Mouth Qhs120 27)  Nortriptyline Hcl 25 Mg Caps (Nortriptyline Hcl) .... One By Mouth Q Hs 28)  Metnax .... Vit B Suplement 29)  Lyrica .... Three Times A Day  Allergies: 1)  Darvon 2)  Diovan (Valsartan) 3)  Flagyl (Metronidazole) 4)  Norvasc (Amlodipine Besylate) 5)  Prinzide  Physical Exam  General:  alert and well-developed.   Eyes:  legally blind B Neck:  supple, full ROM, and no masses.   Lungs:  normal respiratory effort and normal breath sounds.   Heart:  normal rate, regular rhythm, and no murmur.  Abdomen:  soft.   Msk:  left hand grip is partial but improved, essentially no grip strength and cannot get her finges totally closed. decreased sensation soft touch hands. Neurologic:  loss of soft touch sensation B hands, B feet. ambulating w walker--a little unsteady even withthat Psych:  Oriented X3, memory intact for recent and remote, normally interactive, good eye contact, not anxious appearing, and not depressed appearing.     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 40units subcutaneously daily disp qs 1 m in solostar pen this replaces 70-30 mix 2)  Metformin Hcl 1000 Mg Tabs (Metformin hcl) .... Take 1.5  tablets by mouth in in the am and 1 tablets in the pm 3)  Potassium Chloride 40 Meq/57ml (20%) Liqd (Potassium chloride) .[name].. 15 ml once daily 4)  Hyzaar 100-25 Mg Tabs (Losartan potassium-hctz) .[name].. 1 by mouth once daily 5)  Metoprolol Succinate 50 Mg Xr24h-tab (Metoprolol succinate) .... Take one tablet two times a day 6)  Lasix 20 Mg Tabs (Furosemide) .... Take 1 tablet by mouth daily 7)  Simvastatin 40 Mg Tabs (Simvastatin) .[name].. 1 by mouth once daily 8)  Nexium 40 Mg Cpdr (Esomeprazole magnesium) .[name].. 1 capsule by mouth twice a day 9)  Acyclovir 800 Mg Tabs (Acyclovir) .... Take 1 tabletb id/qd as directed 10)  Lancets For Glucometer Testing  11)  Brovana 15 Mcg/17ml Nebu (Arformoterol tartrate) .[name].. 1 neb every twice daily if needed 12)  Bayer Aspirin 325 Mg Tabs (Aspirin) .... Take 1 tablet by mouth once a day 13)  Flexfot Cpap Mask 407  14)  Zyrtec Allergy 10 Mg Tabs (Cetirizine hcl) .[name].. 1 by mouth at bedtime on hold per dr young 15)  Qvar 80 Mcg/act Aers (Beclomethasone dipropionate) .... 2 puffs twice daily 16)  Xalatan 0.005 % Soln (Latanoprost) .[name].. 1 drop each eye at bedtime 17)  Cpap 12 Advanced  18)  Allergy Vaccine Gh 1:10  19)  Trusopt 2 % Soln (Dorzolamide hcl) .[name].. 1 drop in left eye twice daily 20)  Astepro 137 Mcg/spray Soln (Azelastine hcl) .... 2 sprays daily as needed 21)  Singulair 10 Mg Tabs (Montelukast sodium) .... One by mouth qd 22)  Zoloft 100 Mg Tabs (Sertraline hcl) .[name].. 1 1/2 tabs by mouth qd 23)  Allopurinol 300 Mg Tabs (Allopurinol) .[name].. 1 by mouth qd 24)  Diabetic Shoes  .... Patient  with peripheral neuropathy and loss of sensation in feet, some callouses 25)  Fluconazole 100 Mg Tabs (Fluconazole) .[name].. 1 by mouth every other day for three doses 26)  Lyrica 50 Mg Caps (Pregabalin) .... Two by mouth am, one at mid day and two by mouth qhs120 27)  Nortriptyline Hcl 25 Mg Caps (Nortriptyline hcl) .... One by mouth q hs 28)  Metnax  .... Vit b suplement 29)  Lyrica  .... Three times a day Prescriptions: LYRICA 50 MG CAPS (PREGABALIN) two by mouth am, one at mid day and two by mouth  qhs120  #150 x 2   Entered and Authorized by:   Denny Levy MD   Signed by:   Denny Levy MD on 06/21/2010   Method used:   Historical   RxID:   [id number]    Orders Added: 1)  [facility]- Est  Level 4 [14782]

## 2010-10-02 NOTE — Assessment & Plan Note (Signed)
Summary: DM check/Collins   Vital Signs:  Patient profile:   60 year old female Weight:      197.3 pounds Temp:     97.5 degrees F oral Pulse rate:   73 / minute Pulse rhythm:   regular BP sitting:   117 / 68  (right arm) Cuff size:   large  Vitals Entered By: Loralee Pacas CMA (October 11, 2009 11:01 AM)  Primary Care Provider:  Denny Levy MD   History of Present Illness: Diabetes follow up with blood sugars in    good control,   no episodes of low blood sugar. Taking medicines regularly and having no problems with them. Has not lost the weigt she wanted but is feeling well from this standpoint.  Continued left hand numbness--not sure the brace or the gabapentin have made any  difference. Numbness in whole hand incl all fingers.  Having some trouble swallowing her large pills (metformin and KCl). Said she had an esophageal "stretch' done many tyears ago--not sure who did it.   Current Medications (verified): 1)  Novolog Mix 70/30 Flexpen 70-30 % Susp (Insulin Aspart Prot & Aspart) .... 28 Units Prior and Breakfast and 28 Units Prior To Evening Meal.  Dispense One Month Supply. 2)  Metformin Hcl 1000 Mg Tabs (Metformin Hcl) .... Take 1.5  Tablets By Mouth in in The Am and 1 Tablets in The Pm 3)  Potassium Chloride 40 Meq/44ml (20%) Liqd (Potassium Chloride) .[name].. 15 Cc Two Times A Day Disp Qs 3 M 4)  Hyzaar 100-25 Mg  Tabs (Losartan Potassium-Hctz) .[name].. 1 By Mouth Once Daily 5)  Lopressor 50 Mg Tabs (Metoprolol Tartrate) .... Two Times A Day 6)  Clonidine Hcl 0.1 Mg Tabs (Clonidine Hcl) .[name].. 1 Tablet By Mouth Twice A Day 7)  Lasix 20 Mg Tabs (Furosemide) .... Take 1 Tablet By Mouth Twice A Day 8)  Simvastatin 40 Mg Tabs (Simvastatin) .[name].. 1 By Mouth Once Daily 9)  Nexium 40 Mg Cpdr (Esomeprazole Magnesium) .[name].. 1 Capsule By Mouth Twice A Day 10)  Acyclovir 800 Mg Tabs (Acyclovir) .... Take 1 Tabletb Id/qd As Directed 11)  Lancets For Glucometer Testing 12)  Brovana 15 Mcg/12ml Nebu  (Arformoterol Tartrate) .[name].. 1 Neb Every Twice Daily If Needed 13)  Bayer Aspirin 325 Mg Tabs (Aspirin) .... Take 1 Tablet By Mouth Once A Day 14)  Flexfot Cpap Mask 407 15)  Zyrtec Allergy 10 Mg  Tabs (Cetirizine Hcl) .[name].. 1 By Mouth At Bedtime 16)  Qvar 80 Mcg/act  Aers (Beclomethasone Dipropionate) .... 2 Puffs Twice Daily 17)  Xalatan 0.005 %  Soln (Latanoprost) .[name].. 1 Drop Each Eye At Bedtime 18)  Cpap 12 Advanced 19)  Allergy Vaccine Gh 1:10 20)  Trusopt 2 % Soln (Dorzolamide Hcl) .[name].. 1 Drop in Left Eye Twice Daily 21)  Astepro 137 Mcg/spray Soln (Azelastine Hcl) .... 2 Sprays Daily As Needed 22)  Singulair 10 Mg Tabs (Montelukast Sodium) .... One By Mouth Qd 23)  Zoloft 100 Mg Tabs (Sertraline Hcl) .[name].. 1 1/2 Tabs By Mouth Qd 24)  Allopurinol 300 Mg Tabs (Allopurinol) .[name].. 1 Tablet Every Day Start On Sept.2nd 25)  Gabapentin 300 Mg Caps (Gabapentin) .[name].. 1 By Mouth Bid 26)  Allopurinol 300 Mg Tabs (Allopurinol) .[name].. 1 By Mouth Qd  Allergies: 1)  Darvon (Propoxyphene Hcl) 2)  Diovan (Valsartan) 3)  Flagyl (Metronidazole) 4)  Norvasc (Amlodipine Besylate) 5)  Prinzide  Physical Exam  General:  alert and well-developed.   Eyes:  legally blind Neck:  supple, full ROM, no masses, no thyromegaly, and no carotid bruits.   Lungs:  normal respiratory effort and normal breath sounds.   Heart:  normal rate, regular rhythm, and no murmur.   Msk:  left hand normal grip strength,  Pulses:  radial pulses 2+ B= Extremities:  no edema peripherally Neurologic:  B T tubes in place. The left appears patent, I am not sure about the right. TMs look otherwise normal. No drainage    Impression & Recommendations:  Problem # 1:  DIABETES MELLITUS II, UNCOMPLICATED (ICD-250.00)  Orders: A1C-[facility] (44034) Basic Met-[facility] (74259-56387) [facility]- Est  Level 4 (56433) excellent control  Problem # 2:  HYPOPOTASSEMIA (ICD-276.8)  Orders: Basic Met-[facility] (29518-84166) [facility]- Est  Level 4 (06301) change  her pills to liquid  Problem # 3:  PERIPHERAL NEUROPATHY (ICD-356.9)  Orders: TSH-[facility] (60109-32355) [facility]- Est  Level 4 (73220) very unclear to me what her single hand parasthsias and numbness are from. Will set her up for PNCV left wrist, median and ulnar nerve to be eval.  Problem # 4:  DYSPHAGIA UNSPECIFIED (ICD-787.20)  change her postassium to liquid. GI consult  Orders: [facility]- Est  Level 4 (99214)  Complete Medication List: 1)  Novolog Mix 70/30 Flexpen 70-30 % Susp (Insulin aspart prot & aspart) .... 28 units prior and breakfast and 28 units prior to evening meal.  dispense one month supply. 2)  Metformin Hcl 1000 Mg Tabs (Metformin hcl) .... Take 1.5  tablets by mouth in in the am and 1 tablets in the pm 3)  Potassium Chloride 40 Meq/39ml (20%) Liqd (Potassium chloride) .[name].. 15 cc two times a day disp qs 3 m 4)  Hyzaar 100-25 Mg Tabs (Losartan potassium-hctz) .[name].. 1 by mouth once daily 5)  Lopressor 50 Mg Tabs (Metoprolol tartrate) .... Two times a day 6)  Clonidine Hcl 0.1 Mg Tabs (Clonidine hcl) .[name].. 1 tablet by mouth twice a day 7)  Lasix 20 Mg Tabs (Furosemide) .... Take 1 tablet by mouth twice a day 8)  Simvastatin 40 Mg Tabs (Simvastatin) .[name].. 1 by mouth once daily 9)  Nexium 40 Mg Cpdr (Esomeprazole magnesium) .[name].. 1 capsule by mouth twice a day 10)  Acyclovir 800 Mg Tabs (Acyclovir) .... Take 1 tabletb id/qd as directed 11)  Lancets For Glucometer Testing  12)  Brovana 15 Mcg/74ml Nebu (Arformoterol tartrate) .[name].. 1 neb every twice daily if needed 13)  Bayer Aspirin 325 Mg Tabs (Aspirin) .... Take 1 tablet by mouth once a day 14)  Flexfot Cpap Mask 407  15)  Zyrtec Allergy 10 Mg Tabs (Cetirizine hcl) .[name].. 1 by mouth at bedtime 16)  Qvar 80 Mcg/act Aers (Beclomethasone dipropionate) .... 2 puffs twice daily 17)  Xalatan 0.005 % Soln (Latanoprost) .[name].. 1 drop each eye at bedtime 18)  Cpap 12 Advanced  19)  Allergy Vaccine Gh 1:10  20)  Trusopt 2 % Soln (Dorzolamide hcl)  .[name].. 1 drop in left eye twice daily 21)  Astepro 137 Mcg/spray Soln (Azelastine hcl) .... 2 sprays daily as needed 22)  Singulair 10 Mg Tabs (Montelukast sodium) .... One by mouth qd 23)  Zoloft 100 Mg Tabs (Sertraline hcl) .[name].. 1 1/2 tabs by mouth qd 24)  Allopurinol 300 Mg Tabs (Allopurinol) .[name].. 1 tablet every day start on sept.2nd 25)  Gabapentin 300 Mg Caps (Gabapentin) .[name].. 1 by mouth bid 26)  Allopurinol 300 Mg Tabs (Allopurinol) .[name].. 1 by mouth qd  Patient Instructions: 1)  Please schedule  a follow-up appointment in 3 months .  Prescriptions: POTASSIUM CHLORIDE 40 MEQ/15ML (20%) LIQD (POTASSIUM CHLORIDE) 15 cc two times a day disp qs 3 m  #3 x 3   Entered and Authorized by:   Denny Levy MD   Signed by:   Denny Levy MD on 10/11/2009   Method used:   Historical   RxID:   [id number]   Prevention & Chronic Care Immunizations   Influenza vaccine: Fluvax 3+  (06/02/2009)   Influenza vaccine due: 06/08/2009    Tetanus booster: 03/02/2008: given   Tetanus booster due: 03/02/2018    Pneumococcal vaccine: Done.  (06/02/1993)   Pneumococcal vaccine due: None  Colorectal Screening   Hemoccult: not indicated  (06/09/2008)   Hemoccult due: Not Indicated    Colonoscopy: Done.  (10/31/2005)   Colonoscopy due: 11/01/2015  Other Screening   Pap smear: Not documented   Pap smear due: Not Indicated    Mammogram: ASSESSMENT: Negative - BI-RADS 1^MM DIGITAL SCREENING  (04/14/2009)   Mammogram due: 04/14/2010   Smoking status: never  (07/26/2009)  Diabetes Mellitus   HgbA1C: 6.7  (10/11/2009)   HgbA1C action/deferral: Ordered  (06/28/2009)   Hemoglobin A1C due: 07/29/2008    Eye exam: Normal  (09/04/2009)    Foot exam: Not documented   High risk foot: Not documented   Foot care education: Not documented    Urine microalbumin/creatinine ratio: Not documented    Diabetes flowsheet reviewed?: Yes   Progress toward A1C goal: At goal  Lipids   Total Cholesterol: 93   (01/04/2009)   LDL: 47  (01/04/2009)   LDL Direct: 55  (07/02/2007)   HDL: 30  (01/04/2009)   Triglycerides: 81  (01/04/2009)    SGOT (AST): 31  (05/01/2009)   SGPT (ALT): 34  (09/14/2008)   Alkaline phosphatase: 78  (09/14/2008)   Total bilirubin: 0.3  (09/14/2008)    Lipid flowsheet reviewed?: Yes   Progress toward LDL goal: At goal  Hypertension   Last Blood Pressure: 117 / 68  (10/11/2009)   Serum creatinine: 0.8  (05/23/2009)   Serum potassium 3.9  (05/23/2009)    Hypertension flowsheet reviewed?: Yes   Progress toward BP goal: At goal  Self-Management Support :   Personal Goals (by the next clinic visit) :     Personal A1C goal: 7  (04/07/2009)     Personal blood pressure goal: 130/80  (04/07/2009)     Personal LDL goal: 70  (04/07/2009)     Home glucose monitoring frequency: 1 time daily  (04/07/2009)    Diabetes self-management support: Written self-care plan  (04/07/2009)    Hypertension self-management support: Not documented    Hypertension self-management support not done because: Good outcomes  (04/07/2009)    Lipid self-management support: Not documented     Lipid self-management support not done because: Good outcomes  (04/07/2009)  Laboratory Results   Blood Tests   Date/Time Received: October 11, 2009 11:05 AM  Date/Time Reported: October 11, 2009 11:15 AM   HGBA1C: 6.7%   (Normal Range: Non-Diabetic - 3-6%   Control Diabetic - 6-8%)  Comments: ...............test performed by......Marland KitchenBonnie A. [place], MLS (ASCP)cm

## 2010-10-02 NOTE — Progress Notes (Signed)
Summary: Rx Prob  Phone Note Call from Patient Call back at Home Phone [phone]   Caller: Patient Summary of Call: pt wanting to check on status of nexium rx says that prior approval has been obtained but the pharmacy says they don't have it.  [facility].  Follow-up for Phone Call        called pharmacy to get number to call for checking on PA and was told that when she runs the RX through it states she has a limit on # 90 tabs of nexium within  365 days .  will ask MD if she wants to persue PA or does she want to change to another med.  called insurance PA and was told patient has to have tried and failed omeprozole and pantoprazole. they will send PA form in case we persue this. Follow-up by: Theresia Lo RN,  February 01, 2009 11:59 AM  Additional Follow-up for Phone Call Additional follow up Details #1::        consulted Dr. Jennette Kettle and the form has been filled out before but was sent sent to wrong place. new PA form filled out for [facility]. Additional Follow-up by: Theresia Lo RN,  February 01, 2009 12:45 PM      Appended Document: [facility]. She has in last 2 months been back omthe omeprazole 20 once daily and then two times a day and is having breakthru so i am filling out this form and faxing today. Copy scanned to chart

## 2010-10-02 NOTE — Consult Note (Signed)
Summary: [facility]   Imported By: Bradly Bienenstock 04/02/2010 12:03:38  _____________________________________________________________________  External Attachment:    Type:   Image     Comment:   External Document

## 2010-10-02 NOTE — Miscellaneous (Signed)
Summary: Injection Record / [facility]    Injection Record / [facility]    Imported By: Lennie Odor 05/04/2010 12:06:38  _____________________________________________________________________  External Attachment:    Type:   Image     Comment:   External Document

## 2010-10-02 NOTE — Letter (Signed)
Summary: LAB Letter  [facility]  [address]   [place], [place] [address]   Phone: [phone]  Fax: [phone]    07/06/2007  JANASHIA PARCO [address] [name], [place]  [address]  Dear Ms. Yearsley,   Your LDL cholesterol was 55. The goal is less than 100 for you so we are definitely there. In fact, we may be able to decrease your cholesterol medicine a bit--or switch you to a cheaper generic and decrease at the same time. We can discuss when I see you back. Your other blood work, electrolytes and kidney function was also OK although you potassium is borderline low. I would not make any changes now.  Hope you are well!        Sincerely,   Denny Levy MD [facility]  Appended Document: LAB Letter mailed letter to pt

## 2010-10-02 NOTE — Assessment & Plan Note (Signed)
Summary: F/U [name] [name]   Vital Signs:  Patient Profile:   60 Years Old Female Height:     61.5 inches Weight:      184 pounds Pulse rate:   78 / minute BP sitting:   129 / 78  (left arm)                 PCP:  Denny Levy MD  Chief Complaint:  Diabetes Management.  History of Present Illness: 60 yo AAF presents for follow up on DM management.  She currently on Lantus 16 units every morning.  She reports only missing one dose of Lantus which was the night of her appointment prior to switching to taking it in the morning.  Her morning average is 152 mg/dl.  Over the last 2 weeks her am blood sugar readings are consistently coming down.  Her lowest am reading is 123 mg/dl.  Her one nighttime reading is 240 mg/dl.  No complaints of hypoglycemia.  She does that she has occasional sharp pain in her toes.  Her blood pressure readings at home are at goal as well.    Diabetes Management History:      She is (or has been) enrolled in the "Diabetic Education Program".  She states understanding of dietary principles but she is not following the appropriate diet.  No sensory loss is reported.  Self foot exams are being performed.  She is checking home blood sugars.  She says that she is not exercising regularly.        Hypoglycemic symptoms are not occurring.        Her home fasting blood sugars are as follows: highest: 211; lowest: 123; average: 152.  Her 4:00 PM blood sugars reveal: lowest: 87.  9:00 PM blood sugars are: highest: 240.     Current Allergies: DARVON (PROPOXYPHENE HCL) DIOVAN (VALSARTAN) FLAGYL (METRONIDAZOLE) METOPROLOL TARTRATE (METOPROLOL TARTRATE) NORVASC (AMLODIPINE BESYLATE) PRINZIDE (LISINOPRIL-HYDROCHLOROTHIAZIDE)        Impression & Recommendations:  Problem # 1:  DIABETES MELLITUS II, UNCOMPLICATED (ICD-250.00) Assessment: Improved DM improving over time.  Her morning average is 152 mg/dl.  Will increase Lantus 18 units in the morning.  Patient will also monitor  blood sugars in the evening at bedtime as well.   Her updated medication list for this problem includes:    Lantus Solostar 100 Unit/ml Soln (Insulin glargine) .[name]... As directed 18 units each morning    Hyzaar 100-25 Mg Tabs (Losartan potassium-hctz) .[name]... 1 by mouth once daily    Glipizide 10 Mg Tb24 (Glipizide) .[name]... Take 1 tablet by mouth twice a day    Metformin Hcl 1000 Mg Tabs (Metformin hcl) .[name]... Take 1 tablet by mouth twice a day    Bayer Aspirin 325 Mg Tabs (Aspirin) .[name]... Take 1 tablet by mouth once a day  Orders: Reassessment Each 15 min unit- [facility] (04540)   Problem # 2:  HYPERTENSION, BENIGN SYSTEMIC (ICD-401.1) Assessment: Unchanged Blood pressure is well controlled on current medications.  Consistenly below her goal of less than 130/80. Her updated medication list for this problem includes:    Hyzaar 100-25 Mg Tabs (Losartan potassium-hctz) .[name]... 1 by mouth once daily    Metoprolol Succinate 25 Mg Tb24 (Metoprolol succinate) .[name]... Take one tab  two times a day    Clonidine Hcl 0.1 Mg Tabs (Clonidine hcl) .[name]... 1 tablet by mouth twice a day    Lasix 20 Mg Tabs (Furosemide) .[name]... Take 1 tablet by mouth twice a day  Orders: Reassessment  Each 15 min unit- [facility] [phone])   Problem # 3:  GERD (ICD-530.81) Assessment: Unchanged Patient uses Nexium 40 mg twice a day when she has enough tablets.  Will give samples today to help control patient's reflux. Her updated medication list for this problem includes:    Nexium 40 Mg Cpdr (Esomeprazole magnesium) .[name]... 1 capsule by mouth twice a day    Sucralfate 1 Gm Tabs (Sucralfate) .[name]... Take 1 tablet by mouth four times a day  Orders: Reassessment Each 15 min unit- [facility] (93810)   Complete Medication List: 1)  Lantus Solostar 100 Unit/ml Soln (Insulin glargine) .... As directed 18 units each morning 2)  Hyzaar 100-25 Mg Tabs (Losartan potassium-hctz) .[name].. 1 by mouth once daily 3)  Metoprolol Succinate 25 Mg Tb24 (Metoprolol  succinate) .... Take one tab  two times a day 4)  Clonidine Hcl 0.1 Mg Tabs (Clonidine hcl) .[name].. 1 tablet by mouth twice a day 5)  Crestor 20 Mg Tabs (Rosuvastatin calcium) .... Take 1 tablet by mouth once a day 6)  Glipizide 10 Mg Tb24 (Glipizide) .... Take 1 tablet by mouth twice a day 7)  Lasix 20 Mg Tabs (Furosemide) .... Take 1 tablet by mouth twice a day 8)  Metformin Hcl 1000 Mg Tabs (Metformin hcl) .... Take 1 tablet by mouth twice a day 9)  Nexium 40 Mg Cpdr (Esomeprazole magnesium) .[name].. 1 capsule by mouth twice a day 10)  Reglan 10 Mg Tabs (Metoclopramide hcl) .... Take 1 tablet by mouth twice a day 11)  Singulair 10 Mg Tabs (Montelukast sodium) .... Take 1 tablet by mouth once a day 12)  Spiriva Handihaler 18 Mcg Caps (Tiotropium bromide monohydrate) .... Inhale 1 capsule by mouth once a day 13)  Sucralfate 1 Gm Tabs (Sucralfate) .... Take 1 tablet by mouth four times a day 14)  Nitroglycerin 0.4 Mg Subl (Nitroglycerin) .... Place 1 tablet under tongue as directed 15)  Acyclovir 800 Mg Tabs (Acyclovir) .... Take 1 tablet daily 16)  Albuterol Sulfate 1.25 Mg/42ml Nebu (Albuterol sulfate) .... Inhale 1 vial as directed twice a day 17)  Bayer Aspirin 325 Mg Tabs (Aspirin) .... Take 1 tablet by mouth once a day 18)  Proventil Hfa 108 (90 Base) Mcg/act Aers (Albuterol sulfate) .... Inhale 2 puff using inhaler every four hours 19)  Flexfot Cpap Mask 407  20)  Lancets For Glucometer Testing   Diabetes Management Assessment/Plan:      The following lipid goals have been established for the patient: Total cholesterol goal of 200; LDL cholesterol goal of 100; HDL cholesterol goal of 40; Triglyceride goal of 200.     Patient Instructions: 1)  Please schedule a follow-up appointment in 1 month with Pharmacy clinic. 2)  Check your blood sugars regularly. If your readings are usually above 300 or below 70 you should contact our office. 3)  It is important that your Diabetic A1c level is checked  every 3 months.  Will have it checked at next visit. 4)  Increase your Lantus 18 units in the morning. 5)  Check your blood sugar twice a day, before breakfast and before bedtime. 6)  Continue all your other medications. 7)  Keep your appointment with Dr. Jennette Kettle.    ]

## 2010-10-02 NOTE — Letter (Signed)
Summary: [facility]  [facility]   Imported By: Clydell Hakim 02/02/2009 16:11:45  _____________________________________________________________________  External Attachment:    Type:   Image     Comment:   External Document

## 2010-10-02 NOTE — Miscellaneous (Signed)
  Clinical Lists Changes  Observations: Added new observation of DIAB EYE EX: Normal (09/04/2009 9:53)      Ophthalmology Exam  Procedure date:  09/04/2009  Findings:      Normal

## 2010-10-02 NOTE — Progress Notes (Signed)
Summary: Cpap mask & med refills  Phone Note Call from Patient Call back at Home Phone [phone]   Summary of Call: Pt is requesting to speak with someone about her cpap machine. Initial call taken by: Haydee Salter,  May 26, 2007 10:20 AM  Follow-up for Phone Call        Needs new Rx for masks for CPAP machine.  Uses Flexifit 407 mask  #1 with 3 refills that will last her for 1 year per [facility].  Pt. also needs refills on Zoloft, Singulair, Metoprolol, and lancets at [facility].  Will inform Dr. Jennette Kettle. Follow-up by: Altamese Dilling CMA,,  May 26, 2007 11:26 AM  Additional Follow-up for Phone Call Additional follow up Details #1::        I am putting rx for these in box to be faxed.    New/Updated Medications: * FLEXFOT CPAP MASK 407  * LANCETS FOR GLUCOMETER TESTING    Prescriptions: LANCETS FOR GLUCOMETER TESTING   #100 x 12   Entered and Authorized by:   Denny Levy MD   Signed by:   Denny Levy MD on 05/27/2007   Method used:   Print then Give to Patient   RxID:   [id number] FLEXFOT CPAP MASK 407   #1 x 3   Entered and Authorized by:   Denny Levy MD   Signed by:   Denny Levy MD on 05/27/2007   Method used:   Print then Give to Patient   RxID:   [id number]

## 2010-10-02 NOTE — Miscellaneous (Signed)
Summary: Injection Record/[facility]  Injection Record/[facility]   Imported By: Sherian Rein 01/23/2010 12:13:21  _____________________________________________________________________  External Attachment:    Type:   Image     Comment:   External Document

## 2010-10-02 NOTE — Progress Notes (Signed)
Summary: bumped appt/needs rx refilled   Phone Note Call from Patient Call back at Home Phone [phone]   Summary of Call: wants to speak with someone about her appt tomorrow Initial call taken by: Haydee Salter,  March 19, 2007 1:43 PM  Follow-up for Phone Call        pt is calling back, pts appt was bumped & rescheduled for 08/20, she will need a refill on her insulin to last until her appt, [facility] Follow-up by: ERIN LEVAN,  March 19, 2007 3:12 PM  Additional Follow-up for Phone Call Additional follow up Details #1::        plz refill her insulin for 2 months--I have her on 8 units olantis at night. Plz dbl check that dose w her thanks  ...................................................................SARA NEAL MD  March 23, 2007 8:51 AM     Additional Follow-up for Phone Call Additional follow up Details #2::    LVM asking pt to return call Follow-up by: Tomasa Rand,  March 23, 2007 9:35 AM  Additional Follow-up for Phone Call Additional follow up Details #3:: Details for Additional Follow-up Action Taken: pt is returning call Additional Follow-up by: Haydee Salter,  March 23, 2007 10:06 AM   Pt. and pharmacy contacted. Refil ordered.  ...................................................................Starleen Blue RN  March 23, 2007 11:26 AM

## 2010-10-02 NOTE — Miscellaneous (Signed)
Allergies: 1)  Darvon (Propoxyphene Hcl) 2)  Diovan (Valsartan) 3)  Flagyl (Metronidazole) 4)  Norvasc (Amlodipine Besylate) 5)  Prinzide (Lisinopril-Hydrochlorothiazide)   Complete Medication List: 1)  Novolog Mix 70/30 Flexpen 70-30 % Susp (Insulin aspart prot & aspart) .... 28 units prior and breakfast and 28 units prior to evening meal.  dispense one month supply. 2)  Metformin Hcl 1000 Mg Tabs (Metformin hcl) .... Take 1.5  tablets by mouth in in the am and 1 tablets in the pm 3)  Potassium Chloride Crys Cr 20 Meq Cr-tabs (Potassium chloride crys cr) .[name].. 1 by mouth once daily please dispense capsules if available 4)  Hyzaar 100-25 Mg Tabs (Losartan potassium-hctz) .[name].. 1 by mouth once daily 5)  Lopressor 50 Mg Tabs (Metoprolol tartrate) .... Two times a day 6)  Clonidine Hcl 0.1 Mg Tabs (Clonidine hcl) .[name].. 1 tablet by mouth twice a day 7)  Lasix 20 Mg Tabs (Furosemide) .... Take 1 tablet by mouth twice a day 8)  Simvastatin 40 Mg Tabs (Simvastatin) .[name].. 1 by mouth once daily 9)  Nexium 40 Mg Cpdr (Esomeprazole magnesium) .[name].. 1 capsule by mouth twice a day 10)  Acyclovir 800 Mg Tabs (Acyclovir) .... Take 1 tabletb id/qd as directed 11)  Lancets For Glucometer Testing  12)  Brovana 15 Mcg/54ml Nebu (Arformoterol tartrate) .[name].. 1 neb every twice daily if needed 13)  Bayer Aspirin 325 Mg Tabs (Aspirin) .... Take 1 tablet by mouth once a day 14)  Flexfot Cpap Mask 407  15)  Zyrtec Allergy 10 Mg Tabs (Cetirizine hcl) .[name].. 1 by mouth at bedtime 16)  Qvar 80 Mcg/act Aers (Beclomethasone dipropionate) .... 2 puffs twice daily 17)  Xalatan 0.005 % Soln (Latanoprost) .[name].. 1 drop each eye at bedtime 18)  Cpap 12 Advanced  19)  Allergy Vaccine Gh 1:50  20)  Allergy Vaccine Gh Advance To 1:10 Next Order  .... Build per protocol 21)  Trusopt 2 % Soln (Dorzolamide hcl) .[name].. 1 drop in left eye twice daily 22)  Astepro 137 Mcg/spray Soln (Azelastine hcl) .... 2 sprays daily as needed 23)   Singulair 10 Mg Tabs (Montelukast sodium) .... One by mouth qd 24)  Zoloft 100 Mg Tabs (Sertraline hcl) .[name].. 1 1/2 tabs by mouth qd 25)  Promethazine-codeine 6.25-10 Mg/72ml Syrp (Promethazine-codeine) .[name].. 1 teaspoon qid as needed cough  Prevention & Chronic Care Immunizations   Influenza vaccine: given  (06/08/2008)   Influenza vaccine due: 06/08/2009    Tetanus booster: 03/02/2008: given   Tetanus booster due: 03/02/2018    Pneumococcal vaccine: Done.  (06/02/1993)   Pneumococcal vaccine due: None  Colorectal Screening   Hemoccult: not indicated  (06/09/2008)   Hemoccult due: Not Indicated    Colonoscopy: Done.  (10/31/2005)   Colonoscopy due: 11/01/2015  Other Screening   Pap smear: Not documented   Pap smear due: Not Indicated    Mammogram: ASSESSMENT: Negative - BI-RADS 1^MM DIGITAL SCREENING  (04/14/2009)   Mammogram due: 04/14/2010   Smoking status: never  (01/04/2009)  Diabetes Mellitus   HgbA1C: 6.6  (03/09/2009)   Hemoglobin A1C due: 07/29/2008    Eye exam: Not documented    Foot exam: Not documented   High risk foot: Not documented   Foot care education: Not documented    Urine microalbumin/creatinine ratio: Not documented  Lipids   Total Cholesterol: 93  (01/04/2009)   LDL: 47  (01/04/2009)   LDL Direct: 55  (07/02/2007)   HDL: 30  (01/04/2009)  Triglycerides: 81  (01/04/2009)    SGOT (AST): 36  (09/14/2008)   SGPT (ALT): 34  (09/14/2008)   Alkaline phosphatase: 78  (09/14/2008)   Total bilirubin: 0.3  (09/14/2008)  Hypertension   Last Blood Pressure: 110 / 71  (04/07/2009)   Serum creatinine: 0.71  (09/14/2008)   Serum potassium 4.2  (09/14/2008)  Self-Management Support :   Personal Goals (by the next clinic visit) :     Personal A1C goal: 7  (04/07/2009)     Personal blood pressure goal: 130/80  (04/07/2009)     Personal LDL goal: 70  (04/07/2009)    Patient will work on the following items until the next clinic visit to reach  self-care goals:     Medications and monitoring: check my blood sugar, check my blood pressure  (04/07/2009)     Eating: drink diet soda or water instead of juice or soda  (04/07/2009)     Activity: take a 30 minute walk every day  (04/07/2009)     Home glucose monitoring frequency: 1 time daily  (04/07/2009)    Diabetes self-management support: Written self-care plan  (04/07/2009)    Hypertension self-management support: Not documented    Hypertension self-management support not done because: Good outcomes  (04/07/2009)    Lipid self-management support: Not documented     Lipid self-management support not done because: Good outcomes  (04/07/2009)

## 2010-10-02 NOTE — Assessment & Plan Note (Signed)
Summary: uti?,df   Vital Signs:  Patient profile:   60 year old female Weight:      196 pounds Pulse rate:   75 / minute BP sitting:   117 / 71  (right arm)  Vitals Entered By: Arlyss Repress CMA, (November 10, 2009 3:31 PM) CC: dysuria x 5 days. Is Patient Diabetic? Yes Pain Assessment Patient in pain? no        Primary Care Provider:  Denny Levy MD  CC:  dysuria x 5 days.[name]  History of Present Illness: Dysuria for 5 days, noticed some blood.  TAH in past.  Allergies: 1)  Darvon (Propoxyphene Hcl) 2)  Diovan (Valsartan) 3)  Flagyl (Metronidazole) 4)  Norvasc (Amlodipine Besylate) 5)  Prinzide  Review of Systems General:  Denies chills and fever. GU:  Complains of dysuria and urinary frequency; denies incontinence.  Physical Exam  General:  usual appearing Abdomen:  no CVA tenderness, mild tenderness over bladder   Impression & Recommendations:  Problem # 1:  CYSTITIS (ICD-595.9) Drink more water, take all of antibotic Her updated medication list for this problem includes:    Keflex 500 Mg Caps (Cephalexin) ..... One tab three times a day for 5 days  Orders: [facility]- Est Level  3 (99213)  Complete Medication List: 1)  Novolog Mix 70/30 Flexpen 70-30 % Susp (Insulin aspart prot & aspart) .... 28 units prior and breakfast and 28 units prior to evening meal.  dispense one month supply. 2)  Metformin Hcl 1000 Mg Tabs (Metformin hcl) .... Take 1.5  tablets by mouth in in the am and 1 tablets in the pm 3)  Potassium Chloride 40 Meq/6ml (20%) Liqd (Potassium chloride) .[name].. 15 cc two times a day disp qs 3 m 4)  Hyzaar 100-25 Mg Tabs (Losartan potassium-hctz) .[name].. 1 by mouth once daily 5)  Lopressor 50 Mg Tabs (Metoprolol tartrate) .... Two times a day 6)  Clonidine Hcl 0.1 Mg Tabs (Clonidine hcl) .[name].. 1 tablet by mouth twice a day 7)  Lasix 20 Mg Tabs (Furosemide) .... Take 1 tablet by mouth twice a day 8)  Simvastatin 40 Mg Tabs (Simvastatin) .[name].. 1 by mouth once  daily 9)  Nexium 40 Mg Cpdr (Esomeprazole magnesium) .[name].. 1 capsule by mouth twice a day 10)  Acyclovir 800 Mg Tabs (Acyclovir) .... Take 1 tabletb id/qd as directed 11)  Lancets For Glucometer Testing  12)  Brovana 15 Mcg/89ml Nebu (Arformoterol tartrate) .[name].. 1 neb every twice daily if needed 13)  Bayer Aspirin 325 Mg Tabs (Aspirin) .... Take 1 tablet by mouth once a day 14)  Flexfot Cpap Mask 407  15)  Zyrtec Allergy 10 Mg Tabs (Cetirizine hcl) .[name].. 1 by mouth at bedtime 16)  Qvar 80 Mcg/act Aers (Beclomethasone dipropionate) .... 2 puffs twice daily 17)  Xalatan 0.005 % Soln (Latanoprost) .[name].. 1 drop each eye at bedtime 18)  Cpap 12 Advanced  19)  Allergy Vaccine Gh 1:10  20)  Trusopt 2 % Soln (Dorzolamide hcl) .[name].. 1 drop in left eye twice daily 21)  Astepro 137 Mcg/spray Soln (Azelastine hcl) .... 2 sprays daily as needed 22)  Singulair 10 Mg Tabs (Montelukast sodium) .... One by mouth qd 23)  Zoloft 100 Mg Tabs (Sertraline hcl) .[name].. 1 1/2 tabs by mouth qd 24)  Allopurinol 300 Mg Tabs (Allopurinol) .[name].. 1 tablet every day start on sept.2nd 25)  Gabapentin 300 Mg Caps (Gabapentin) .[name].. 1 by mouth bid 26)  Allopurinol 300 Mg Tabs (Allopurinol) .Marland KitchenMarland KitchenMarland Kitchen 1  by mouth qd 27)  Keflex 500 Mg Caps (Cephalexin) .... One tab three times a day for 5 days  Other Orders: Urinalysis-[facility] (00000)  Patient Instructions: 1)  Drink more water, take all of antibotic Prescriptions: KEFLEX 500 MG CAPS (CEPHALEXIN) one tab three times a day for 5 days  #15 x 0   Entered and Authorized by:   Luretha Murphy NP   Signed by:   Luretha Murphy NP on 11/10/2009   Method used:   Printed then faxed to ...       [facility] (retail)       [address]       [place], [place]  [address]       Ph: [phone]       Fax: [phone]   RxID:   [id number]   Laboratory Results   Urine Tests  Date/Time Received: November 10, 2009 3:34 PM  Date/Time Reported: November 10, 2009 4:00  PM   Routine Urinalysis   Color: yellow Appearance: Hazy Glucose: 100   (Normal Range: Negative) Bilirubin: negative   (Normal Range: Negative) Ketone: negative   (Normal Range: Negative) Spec. Gravity: 1.015   (Normal Range: 1.003-1.035) Blood: moderate   (Normal Range: Negative) pH: 6.0   (Normal Range: 5.0-8.0) Protein: negative   (Normal Range: Negative) Urobilinogen: 1.0   (Normal Range: 0-1) Nitrite: negative   (Normal Range: Negative) Leukocyte Esterace: moderate   (Normal Range: Negative)  Urine Microscopic WBC/HPF: >20 RBC/HPF: 10-20 Bacteria/HPF: 2+ Mucous/HPF: trace Epithelial/HPF: occ    Comments: ...........test performed by...........Marland KitchenTerese Door, CMA

## 2010-10-02 NOTE — Progress Notes (Signed)
Summary: referral  Phone Note Call from Patient Call back at Home Phone [phone]   Caller: Patient Summary of Call: Dr Jennette Kettle - was supposed to refer her to [facility] for her swollowing problem & also nerve doc for her hands Initial call taken by: De Nurse,  October 19, 2009 9:16 AM  Follow-up for Phone Call        will forward message to MD. Follow-up by: Theresia Lo RN,  October 19, 2009 11:09 AM  Additional Follow-up for Phone Call Additional follow up Details #1::        DEAR WHITE TEAM I have put these in the chart she has [facility] as her insurance so it may be a chore getting these--in fact the nerve conduction studies may not  be available. Thanks!  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 28 units prior and breakfast and 28 units prior to evening meal.  dispense one month supply. 2)  Metformin Hcl 1000 Mg Tabs (Metformin hcl) .... Take 1.5  tablets by mouth in in the am and 1 tablets in the pm 3)  Potassium Chloride 40 Meq/28ml (20%) Liqd (Potassium chloride) .[name].. 15 cc two times a day disp qs 3 m 4)  Hyzaar 100-25 Mg Tabs (Losartan potassium-hctz) .[name].. 1 by mouth once daily 5)  Lopressor 50 Mg Tabs (Metoprolol tartrate) .... Two times a day 6)  Clonidine Hcl 0.1 Mg Tabs (Clonidine hcl) .[name].. 1 tablet by mouth twice a day 7)  Lasix 20 Mg Tabs (Furosemide) .... Take 1 tablet by mouth twice a day 8)  Simvastatin 40 Mg Tabs (Simvastatin) .[name].. 1 by mouth once daily 9)  Nexium 40 Mg Cpdr (Esomeprazole magnesium) .[name].. 1 capsule by mouth twice a day 10)  Acyclovir 800 Mg Tabs (Acyclovir) .... Take 1 tabletb id/qd as directed 11)  Lancets For Glucometer Testing  12)  Brovana 15 Mcg/16ml  Nebu (Arformoterol tartrate) .[name].. 1 neb every twice daily if needed 13)  Bayer Aspirin 325 Mg Tabs (Aspirin) .... Take 1 tablet by mouth once a day 14)  Flexfot Cpap Mask 407  15)  Zyrtec Allergy 10 Mg Tabs (Cetirizine hcl) .[name].. 1 by mouth at bedtime 16)  Qvar 80 Mcg/act Aers (Beclomethasone dipropionate) .... 2 puffs twice daily 17)  Xalatan 0.005 % Soln (Latanoprost) .[name].. 1 drop each eye at bedtime 18)  Cpap 12 Advanced  19)  Allergy Vaccine Gh 1:10  20)  Trusopt 2 % Soln (Dorzolamide hcl) .[name].. 1 drop in left eye twice daily 21)  Astepro 137 Mcg/spray Soln (Azelastine hcl) .... 2 sprays daily as needed 22)  Singulair 10 Mg Tabs (Montelukast sodium) .... One by mouth qd 23)  Zoloft 100 Mg Tabs (Sertraline hcl) .[name].. 1 1/2 tabs by mouth qd 24)  Allopurinol 300 Mg Tabs (Allopurinol) .[name].. 1 tablet every day start on sept.2nd 25)  Gabapentin 300 Mg Caps (Gabapentin) .[name].. 1 by mouth bid 26)  Allopurinol 300 Mg Tabs (Allopurinol) .[name].. 1 by mouth qd  notified patient that we are working on her referrals. PA have to be sent to her insurance company.advised will contact her as soon as we have appointment set up. Theresia Lo RN  October 23, 2009 4:40 PM    PA referral forms faxed  to [facility] for approval. Theresia Lo RN  October 24, 2009 12:00 PM   appointment has been scheduled with Dr. Loreta Ave and referral has been sent to [facility] for Nerve Conduction Study. Theresia Lo RN  October 26, 2009 2:51 PM

## 2010-10-02 NOTE — Assessment & Plan Note (Signed)
Summary: Rx clinic: Dm follow up   Vital Signs:  Patient Profile:   60 Years Old Female Height:     61.5 inches (156.21 cm) Weight:      189 pounds Pulse rate:   72 / minute BP sitting:   108 / 69  (left arm)                 PCP:  Denny Levy MD  Chief Complaint:  Diabetes Management.  History of Present Illness: 60 yo female presents for DM follow up.  Her last A1c was 6.6.  She is not exercising as much.  She just does not feel like exercising, maybe due to lact of energy.  She has changed her diet.  She has lost weight, about 2 pounds.  The majority of her blood sugars are well below 130.  This past week the blood sugars have been between 150 and 170.  30 day average 143.  Her blood sugar this 237.  Diabetes Management History:      The patient is a 60 years old female who comes in for evaluation of DM Type 2.  She is (or has been) enrolled in the "Diabetic Education Program".  She states understanding of dietary principles and is following her diet appropriately.  She is checking home blood sugars.  She says that she is exercising.  Type of exercise includes: biking.  Duration of exercise is estimated to be 15 min.  She is doing this 3 times per week.       Current Allergies: DARVON (PROPOXYPHENE HCL) DIOVAN (VALSARTAN) FLAGYL (METRONIDAZOLE) NORVASC (AMLODIPINE BESYLATE) PRINZIDE (LISINOPRIL-HYDROCHLOROTHIAZIDE)        Impression & Recommendations:  Problem # 1:  DIABETES MELLITUS II, UNCOMPLICATED (ICD-250.00) DM well controlled with an A1c 6.6%.  Will continue all current medication.  Patient wishes to find a cheaper version of Novolog.  With her limited eye sight, the pen is her best option currently.  Will give her Novolog flexpen samples x2.  Lot number [id number] exp date:5/11.  Her updated medication list for this problem includes:    Novolog Flexpen 100 Unit/ml Soln (Insulin aspart) .[name]... 12 units prior to breakfast 12 units prior to evening meal supply one box  of pens. dispense one or three months supply as patient wishes    Metformin Hcl 1000 Mg Tabs (Metformin hcl) .[name]... Take 1.5  tablets by mouth in in the am and 1 tablets in the pm    Lantus Solostar 100 Unit/ml Soln (Insulin glargine) .[name]... As directed 34 units each morning disp 1 month or can dispense three months as patient wishes    Hyzaar 100-25 Mg Tabs (Losartan potassium-hctz) .[name]... 1 by mouth once daily    Bayer Aspirin 325 Mg Tabs (Aspirin) .[name]... Take 1 tablet by mouth once a day  Orders: Reassessment Each 15 min unit- [facility] (86578)   Problem # 2:  HYPERTENSION, BENIGN SYSTEMIC (ICD-401.1) BP well controlled at goal of less than 130/80.    Her updated medication list for this problem includes:    Hyzaar 100-25 Mg Tabs (Losartan potassium-hctz) .[name]... 1 by mouth once daily    Lopressor 50 Mg Tabs (Metoprolol tartrate) .[name]..[name] Two times a day    Clonidine Hcl 0.1 Mg Tabs (Clonidine hcl) .[name]... 1 tablet by mouth twice a day    Lasix 20 Mg Tabs (Furosemide) .[name]... Take 1 tablet by mouth twice a day  Orders: Reassessment Each 15 min unit- [facility] (46962)   Complete Medication List:  1)  Novolog Flexpen 100 Unit/ml Soln (Insulin aspart) .[name].. 12 units prior to breakfast 12 units prior to evening meal supply one box of pens. dispense one or three months supply as patient wishes 2)  Metformin Hcl 1000 Mg Tabs (Metformin hcl) .... Take 1.5  tablets by mouth in in the am and 1 tablets in the pm 3)  Lantus Solostar 100 Unit/ml Soln (Insulin glargine) .... As directed 34 units each morning disp 1 month or can dispense three months as patient wishes 4)  Potassium Chloride Crys Cr 20 Meq Cr-tabs (Potassium chloride crys cr) .[name].. 1 by mouth once daily please dispense capsules if available 5)  Hyzaar 100-25 Mg Tabs (Losartan potassium-hctz) .[name].. 1 by mouth once daily 6)  Lopressor 50 Mg Tabs (Metoprolol tartrate) .... Two times a day 7)  Clonidine Hcl 0.1 Mg Tabs (Clonidine hcl) .[name].. 1 tablet by mouth twice  a day 8)  Lasix 20 Mg Tabs (Furosemide) .... Take 1 tablet by mouth twice a day 9)  Simvastatin 40 Mg Tabs (Simvastatin) .[name].. 1 by mouth once daily 10)  Nexium 40 Mg Cpdr (Esomeprazole magnesium) .[name].. 1 capsule by mouth twice a day 11)  Acyclovir 800 Mg Tabs (Acyclovir) .... Take 1 tabletb id/qd as directed 12)  Albuterol Sulfate 1.25 Mg/52ml Nebu (Albuterol sulfate) .... Inhale 1 vial as directed twice a day 13)  Bayer Aspirin 325 Mg Tabs (Aspirin) .... Take 1 tablet by mouth once a day 14)  Flexfot Cpap Mask 407  15)  Lancets For Glucometer Testing  16)  Zyrtec Allergy 10 Mg Tabs (Cetirizine hcl) .[name].. 1 by mouth at bedtime 17)  Qvar 80 Mcg/act Aers (Beclomethasone dipropionate) .... 2 puffs twice daily 18)  Xalatan 0.005 % Soln (Latanoprost) .[name].. 1 drop each eye at bedtime 19)  Cpap 12 Advanced  20)  Allergy Vaccine Gh 1:50  21)  Allergy Vaccine Gh Advance To 1:10 Next Order  .... Build per protocol 22)  Trusopt 2 % Soln (Dorzolamide hcl) .[name].. 1 drop in left eye twice daily 23)  Astepro 137 Mcg/spray Soln (Azelastine hcl) .... 2 sprays daily as needed 24)  Singulair 10 Mg Tabs (Montelukast sodium) .... On hold 09/2008  Diabetes Management Assessment/Plan:      The following lipid goals have been established for the patient: Total cholesterol goal of 200; LDL cholesterol goal of 100; HDL cholesterol goal of 40; Triglyceride goal of 200.     Patient Instructions: 1)  Please schedule a follow-up appointment in 4 months with pharmacy clinic. 2)  Check your blood sugars regularly. If your readings are usually above 300 or below 70 you should contact our office. 3)  It is important that your Diabetic A1c level is checked every 3 months.  Your last A1c was 6.6% in January. 4)  Continue all your medications.

## 2010-10-02 NOTE — Progress Notes (Signed)
Summary: Requesting rx   Phone Note Call from Patient Call back at Home Phone [phone]   Reason for Call: Talk to Nurse Summary of Call: pt came in the office during lunch stating she needs antibiotics b/c she is having dental work done soon & she always gets antibiotics before the procedure.  Initial call taken by: Knox Royalty,  September 20, 2008 12:49 PM  Follow-up for Phone Call        lmom to call back Follow-up by: Alphia Kava,  September 20, 2008 1:43 PM  Additional Follow-up for Phone Call Additional follow up Details #1::        pt states she will be having her teeth cleaned. She haqs a heart murmur and usually recieves an antibiotic. do you want to give an rx? Additional Follow-up by: Alphia Kava,  September 20, 2008 4:36 PM    Additional Follow-up for Phone Call Additional follow up Details #2::    New recs have changed and she probably does not truly need abx--but since she always has had them let's call in amoxocillin 500 mg sig 2 tabs 1 hour prior to procedure. This is new rec dose--single dose before procedure Thanks!  SARA NEAL MD  September 22, 2008 10:17 AM   Additional Follow-up for Phone Call Additional follow up Details #3:: Details for Additional Follow-up Action Taken: pt notified. pharmacy given verbal order Additional Follow-up by: Alphia Kava,  September 22, 2008 10:47 AM

## 2010-10-02 NOTE — Miscellaneous (Signed)
Summary: Orders Update pft charges  Clinical Lists Changes  Orders: Added new Service order of Carbon Monoxide diffusing w/capacity (94720) - Signed Added new Service order of Lung Volumes (94240) - Signed Added new Service order of Spirometry (Pre & Post) (94060) - Signed 

## 2010-10-02 NOTE — Assessment & Plan Note (Signed)
Summary: f/u hand pain,df   Vital Signs:  Patient profile:   60 year old female Height:      61.5 inches Weight:      195.8 pounds BMI:     36.53 Temp:     98.7 degrees F oral Pulse rate:   115 / minute BP sitting:   115 / 74  (left arm) Cuff size:   large  Vitals Entered By: Gladstone Pih (December 28, 2009 11:03 AM) CC: F/U hand pain Is Patient Diabetic? Yes Did you bring your meter with you today? No Pain Assessment Patient in pain? no        Primary Care Provider:  Denny Levy MD  CC:  F/U hand pain.  History of Present Illness: 1) left side pain--1 week--no better. Pain feels like a fullness with some numbness to area as well. nothing makes it bettoer or worse. Noticed it after she fell in her house--landed on taht side. Slipped on floor while looking for shoes under her bed. No blood in stool, bm remain normal (she has some longstanding constipation) Notes that she has also "not felt right' in last 1-2 weeks--not sure if this started before or after fal--feels weak, dizzy occasionally. No energy.  2) f/u B hand pain. Had the PNCVs. Both hands are bothering her--left is burning and numb all of the time. feels like she has no strentgth--drops things.  Her hands feel swollen.  3) constipation--lifelong. She is now taking metamucil, and stool softeners per Dr Loreta Ave. last colonoscopy was 3(?) y ago.  Habits & Providers  Alcohol-Tobacco-Diet     Tobacco Status: never  Current Medications (verified): 1)  Novolog Mix 70/30 Flexpen 70-30 % Susp (Insulin Aspart Prot & Aspart) .... 28 Units Prior and Breakfast and 28 Units Prior To Evening Meal.  Dispense One Month Supply. 2)  Metformin Hcl 1000 Mg Tabs (Metformin Hcl) .... Take 1.5  Tablets By Mouth in in The Am and 1 Tablets in The Pm 3)  Potassium Chloride 40 Meq/42ml (20%) Liqd (Potassium Chloride) .[name].. 15 Cc Two Times A Day Disp Qs 3 M 4)  Hyzaar 100-25 Mg  Tabs (Losartan Potassium-Hctz) .[name].. 1 By Mouth Once Daily 5)  Lopressor  50 Mg Tabs (Metoprolol Tartrate) .... Two Times A Day 6)  Clonidine Hcl 0.1 Mg Tabs (Clonidine Hcl) .[name].. 1 Tablet By Mouth Twice A Day 7)  Lasix 20 Mg Tabs (Furosemide) .... Take 1 Tablet By Mouth Twice A Day 8)  Simvastatin 40 Mg Tabs (Simvastatin) .[name].. 1 By Mouth Once Daily 9)  Nexium 40 Mg Cpdr (Esomeprazole Magnesium) .[name].. 1 Capsule By Mouth Twice A Day 10)  Acyclovir 800 Mg Tabs (Acyclovir) .... Take 1 Tabletb Id/qd As Directed 11)  Lancets For Glucometer Testing 12)  Brovana 15 Mcg/28ml Nebu (Arformoterol Tartrate) .[name].. 1 Neb Every Twice Daily If Needed 13)  Bayer Aspirin 325 Mg Tabs (Aspirin) .... Take 1 Tablet By Mouth Once A Day 14)  Flexfot Cpap Mask 407 15)  Zyrtec Allergy 10 Mg  Tabs (Cetirizine Hcl) .[name].. 1 By Mouth At Bedtime 16)  Qvar 80 Mcg/act  Aers (Beclomethasone Dipropionate) .... 2 Puffs Twice Daily 17)  Xalatan 0.005 %  Soln (Latanoprost) .[name].. 1 Drop Each Eye At Bedtime 18)  Cpap 12 Advanced 19)  Allergy Vaccine Gh 1:10 20)  Trusopt 2 % Soln (Dorzolamide Hcl) .[name].. 1 Drop in Left Eye Twice Daily 21)  Astepro 137 Mcg/spray Soln (Azelastine Hcl) .... 2 Sprays Daily As Needed 22)  Singulair  10 Mg Tabs (Montelukast Sodium) .... One By Mouth Qd 23)  Zoloft 100 Mg Tabs (Sertraline Hcl) .[name].. 1 1/2 Tabs By Mouth Qd 24)  Gabapentin 300 Mg Caps (Gabapentin) .[name].. 1 By Mouth Bid 25)  Allopurinol 300 Mg Tabs (Allopurinol) .[name].. 1 By Mouth Qd  Allergies: 1)  Darvon (Propoxyphene Hcl) 2)  Diovan (Valsartan) 3)  Flagyl (Metronidazole) 4)  Norvasc (Amlodipine Besylate) 5)  Prinzide  Review of Systems       The patient complains of peripheral edema.  The patient denies anorexia, fever, weight loss, chest pain, syncope, dyspnea on exertion, headaches, hemoptysis, abdominal pain, severe indigestion/heartburn, hematuria, incontinence, and depression.    Physical Exam  General:  alert and well-developed.   Eyes:  legally blind B Neck:  supple, full ROM, and no masses.   Lungs:   normal breath sounds.   Heart:  normal rate, regular rhythm, and no murmur.   Abdomen:  Soft with no rebound. She is diffusely tendeer left upper and mid quadrant area and the area feels somewhat full to palpation but not significantly different from the right side. no masses felt altho this exam is limited somewhat by habitus. no fluid wave. no rib pain to palpation. + bowel sounds all quadrants. Msk:  left hand loss sensation to soft touch palm and all fingers and thumb from mid palm distally. no thenar or hypeothenar atrophy right hand has some decreased soft touch sensation median nerve distribution . grip strength seems 2+ B equal. Extremities:  no edema Additional Exam:  PNCV report summary reviewed: median nerve B have moderate compression at wrist, left ulnar nerve compression at elbow.    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 we ordered labs which were normal (CT also normal). will stop her gabapentin as maybe that is making her weak and dizzy, f/u 3 w  Complete Medication List: 1)  Novolog Mix 70/30 Flexpen 70-30 % Susp (Insulin aspart prot & aspart) .... 28 units prior and breakfast and 28 units prior to evening meal.  dispense one month supply. 2)  Metformin Hcl 1000 Mg Tabs (Metformin hcl) .... Take 1.5  tablets by mouth in in the am and 1 tablets  in the pm 3)  Potassium Chloride 40 Meq/73ml (20%) Liqd (Potassium chloride) .[name].. 15 cc two times a day disp qs 3 m 4)  Hyzaar 100-25 Mg Tabs (Losartan potassium-hctz) .[name].. 1 by mouth once daily 5)  Lopressor 50 Mg Tabs (Metoprolol tartrate) .... Two times a day 6)  Clonidine Hcl 0.1 Mg Tabs (Clonidine hcl) .[name].. 1 tablet by mouth twice a day 7)  Lasix 20 Mg Tabs (Furosemide) .... Take 1 tablet by mouth twice a day 8)  Simvastatin 40 Mg Tabs (Simvastatin) .[name].. 1 by mouth once daily 9)  Nexium 40 Mg Cpdr (Esomeprazole magnesium) .[name].. 1 capsule by mouth twice a day 10)  Acyclovir 800 Mg Tabs (Acyclovir) .... Take  1 tabletb id/qd as directed 11)  Lancets For Glucometer Testing  12)  Brovana 15 Mcg/13ml Nebu (Arformoterol tartrate) .[name].. 1 neb every twice daily if needed 13)  Bayer Aspirin 325 Mg Tabs (Aspirin) .... Take 1 tablet by mouth once a day 14)  Flexfot Cpap Mask 407  15)  Zyrtec Allergy 10 Mg Tabs (Cetirizine hcl) .[name].. 1 by mouth at bedtime 16)  Qvar 80 Mcg/act Aers (Beclomethasone dipropionate) .... 2 puffs twice daily 17)  Xalatan 0.005 % Soln (Latanoprost) .[name].. 1 drop each eye at bedtime 18)  Cpap 12 Advanced  19)  Allergy Vaccine Gh 1:10  20)  Trusopt 2 % Soln (Dorzolamide hcl) .[name].. 1 drop in left eye twice daily 21)  Astepro 137 Mcg/spray Soln (Azelastine hcl) .... 2 sprays daily as needed 22)  Singulair 10 Mg Tabs (Montelukast sodium) .... One by mouth qd 23)  Zoloft 100 Mg Tabs (Sertraline hcl) .[name].. 1 1/2 tabs by mouth qd 24)  Gabapentin 300 Mg Caps (Gabapentin) .[name].. 1 by mouth bid 25)  Allopurinol 300 Mg Tabs (Allopurinol) .[name].. 1 by mouth qd  Other Orders: Ophthalmology Referral (Ophthalmology)

## 2010-10-02 NOTE — Letter (Signed)
Summary: CMN for supplies for CPAP/[facility]  CMN for supplies for CPAP/[facility]   Imported By: Sherian Rein 09/08/2009 11:24:52  _____________________________________________________________________  External Attachment:    Type:   Image     Comment:   External Document

## 2010-10-02 NOTE — Consult Note (Signed)
Summary: [facility]   Imported By: Clydell Hakim 04/25/2010 10:58:37  _____________________________________________________________________  External Attachment:    Type:   Image     Comment:   External Document

## 2010-10-02 NOTE — Miscellaneous (Signed)
Summary: Promethzine/codiene rx  Medications Added PROMETHAZINE-CODEINE 6.25-10 MG/5ML SYRP (PROMETHAZINE-CODEINE) 1 tsp every 6 hours as needed       Clinical Lists Changes  Medications: Added new medication of PROMETHAZINE-CODEINE 6.25-10 MG/5ML SYRP (PROMETHAZINE-CODEINE) 1 tsp every 6 hours as needed - Signed Rx of PROMETHAZINE-CODEINE 6.25-10 MG/5ML SYRP (PROMETHAZINE-CODEINE) 1 tsp every 6 hours as needed;  #368ml x 0;  Signed;  Entered by: Reynaldo Minium CMA;  Authorized by: Waymon Budge MD;  Method used: Telephoned to [facility], [address] [place], [place], [place]  [address], Ph: [phone], Fax: [phone]    Prescriptions: PROMETHAZINE-CODEINE 6.25-10 MG/5ML SYRP (PROMETHAZINE-CODEINE) 1 tsp every 6 hours as needed  #351ml x 0   Entered by:   Reynaldo Minium CMA   Authorized by:   Waymon Budge MD   Signed by:   Reynaldo Minium CMA on 07/27/2008   Method used:   Telephoned to ...       [facility] (retail)       [address]       [place], [place]  [address]       Ph: [phone]       Fax: [phone]   RxID:   [id number]

## 2010-10-02 NOTE — Assessment & Plan Note (Signed)
Summary: Diabetes - Alopecia due to meds - Rx Clinic   Vital Signs:  Patient profile:   60 year old female Weight:      189 pounds  Primary Care Provider:  Denny Levy MD   History of Present Illness: Patient arrives with home blood glucose readings AND home BP recordings.   CBGs are consistently at goal.  BP readings are consistently at goal   Patient willing to check A1c today.  States she has NOT YET switched to generic simvastatin and she is OK with continuing Crestor at this time until Rx runs out.   Concern of medicaiton induced hair loss was raised.  All meds were reviewed.  No recent med changes were noted by patient.  She expressed concern over the possibility of lantus causing hair loss.   Patient was willing to change insulin to evaluate potential for lantus induced hair loss.   Current Medications (verified): 1)  Novolog Mix 70/30 Flexpen 70-30 % Susp (Insulin Aspart Prot & Aspart) .... 28 Units Prior and Breakfast and 28 Units Prior To Evening Meal.  Dispense One Month Supply. 2)  Metformin Hcl 1000 Mg Tabs (Metformin Hcl) .... Take 1.5  Tablets By Mouth in in The Am and 1 Tablets in The Pm 3)  Potassium Chloride Crys Cr 20 Meq Cr-Tabs (Potassium Chloride Crys Cr) .[name].. 1 By Mouth Once Daily Please Dispense Capsules If Available 4)  Hyzaar 100-25 Mg  Tabs (Losartan Potassium-Hctz) .[name].. 1 By Mouth Once Daily 5)  Lopressor 50 Mg Tabs (Metoprolol Tartrate) .... Two Times A Day 6)  Clonidine Hcl 0.1 Mg Tabs (Clonidine Hcl) .[name].. 1 Tablet By Mouth Twice A Day 7)  Lasix 20 Mg Tabs (Furosemide) .... Take 1 Tablet By Mouth Twice A Day 8)  Simvastatin 40 Mg Tabs (Simvastatin) .[name].. 1 By Mouth Once Daily 9)  Nexium 40 Mg Cpdr (Esomeprazole Magnesium) .[name].. 1 Capsule By Mouth Twice A Day 10)  Acyclovir 800 Mg Tabs (Acyclovir) .... Take 1 Tabletb Id/qd As Directed 11)  Albuterol Sulfate 1.25 Mg/43ml Nebu (Albuterol Sulfate) .... Inhale 1 Vial As Directed Twice A Day 12)  Bayer Aspirin 325  Mg Tabs (Aspirin) .... Take 1 Tablet By Mouth Once A Day 13)  Flexfot Cpap Mask 407 14)  Lancets For Glucometer Testing 15)  Zyrtec Allergy 10 Mg  Tabs (Cetirizine Hcl) .[name].. 1 By Mouth At Bedtime 16)  Qvar 80 Mcg/act  Aers (Beclomethasone Dipropionate) .... 2 Puffs Twice Daily 17)  Xalatan 0.005 %  Soln (Latanoprost) .[name].. 1 Drop Each Eye At Bedtime 18)  Cpap 12 Advanced 19)  Allergy Vaccine Gh 1:50 20)  Allergy Vaccine Gh Advance To 1:10 Next Order .... Build Per Protocol 21)  Trusopt 2 % Soln (Dorzolamide Hcl) .[name].. 1 Drop in Left Eye Twice Daily 22)  Astepro 137 Mcg/spray Soln (Azelastine Hcl) .... 2 Sprays Daily As Needed 23)  Singulair 10 Mg Tabs (Montelukast Sodium) .... One By Mouth Qd 24)  Zoloft 100 Mg Tabs (Sertraline Hcl) .[name].. 1 1/2 Tabs By Mouth Qd 25)  Promethazine-Codeine 6.25-10 Mg/99ml Syrp (Promethazine-Codeine) .[name].. 1 Teaspoon Qid As Needed Cough  Allergies (verified): 1)  Darvon (Propoxyphene Hcl) 2)  Diovan (Valsartan) 3)  Flagyl (Metronidazole) 4)  Norvasc (Amlodipine Besylate) 5)  Prinzide (Lisinopril-Hydrochlorothiazide)   Impression & Recommendations:  Problem # 1:  DIABETES MELLITUS II, UNCOMPLICATED (ICD-250.00) A1c of 6.6 and home CBGs of <200 reveal excellent control fo CBGs.  Patient was interested in switching from Lantus to avoid potential for  lantus induced hair loss. Patient comfortable with switch to Novolog Mix 70/30 and taking on ly 2 shots per day (one prior to AM meal and one prior to PM meal).  Patient vergalized understanding of new regimen 28 units twice daily.  TTFFC: 35 minutes.  Seen today with Magnus Ivan, MD FP resident.  The following medications were removed from the medication list:    Lantus Solostar 100 Unit/ml Soln (Insulin glargine) .[name]... As directed 34 units each morning disp 1 month or can dispense three months as patient wishes Her updated medication list for this problem includes:    Novolog Mix 70/30 Flexpen 70-30 % Susp  (Insulin aspart prot & aspart) .[name]... 28 units prior and breakfast and 28 units prior to evening meal.  dispense one month supply.    Metformin Hcl 1000 Mg Tabs (Metformin hcl) .[name]... Take 1.5  tablets by mouth in in the am and 1 tablets in the pm    Hyzaar 100-25 Mg Tabs (Losartan potassium-hctz) .[name]... 1 by mouth once daily    Bayer Aspirin 325 Mg Tabs (Aspirin) .[name]... Take 1 tablet by mouth once a day  Orders: A1C-[facility] ([address])  Problem # 2:  UNSPECIFIED ALOPECIA (ICD-704.00) Unknown cause of alopecia.  Will switch insuiln from Lantus/Novolog to Novolog Mix 70/30.    Complete Medication List: 1)  Novolog Mix 70/30 Flexpen 70-30 % Susp (Insulin aspart prot & aspart) .... 28 units prior and breakfast and 28 units prior to evening meal.  dispense one month supply. 2)  Metformin Hcl 1000 Mg Tabs (Metformin hcl) .... Take 1.5  tablets by mouth in in the am and 1 tablets in the pm 3)  Potassium Chloride Crys Cr 20 Meq Cr-tabs (Potassium chloride crys cr) .[name].. 1 by mouth once daily please dispense capsules if available 4)  Hyzaar 100-25 Mg Tabs (Losartan potassium-hctz) .[name].. 1 by mouth once daily 5)  Lopressor 50 Mg Tabs (Metoprolol tartrate) .... Two times a day 6)  Clonidine Hcl 0.1 Mg Tabs (Clonidine hcl) .[name].. 1 tablet by mouth twice a day 7)  Lasix 20 Mg Tabs (Furosemide) .... Take 1 tablet by mouth twice a day 8)  Simvastatin 40 Mg Tabs (Simvastatin) .[name].. 1 by mouth once daily 9)  Nexium 40 Mg Cpdr (Esomeprazole magnesium) .[name].. 1 capsule by mouth twice a day 10)  Acyclovir 800 Mg Tabs (Acyclovir) .... Take 1 tabletb id/qd as directed 11)  Albuterol Sulfate 1.25 Mg/81ml Nebu (Albuterol sulfate) .... Inhale 1 vial as directed twice a day 12)  Bayer Aspirin 325 Mg Tabs (Aspirin) .... Take 1 tablet by mouth once a day 13)  Flexfot Cpap Mask 407  14)  Lancets For Glucometer Testing  15)  Zyrtec Allergy 10 Mg Tabs (Cetirizine hcl) .[name].. 1 by mouth at bedtime 16)  Qvar 80 Mcg/act Aers (Beclomethasone  dipropionate) .... 2 puffs twice daily 17)  Xalatan 0.005 % Soln (Latanoprost) .[name].. 1 drop each eye at bedtime 18)  Cpap 12 Advanced  19)  Allergy Vaccine Gh 1:50  20)  Allergy Vaccine Gh Advance To 1:10 Next Order  .... Build per protocol 21)  Trusopt 2 % Soln (Dorzolamide hcl) .[name].. 1 drop in left eye twice daily 22)  Astepro 137 Mcg/spray Soln (Azelastine hcl) .... 2 sprays daily as needed 23)  Singulair 10 Mg Tabs (Montelukast sodium) .... One by mouth qd 24)  Zoloft 100 Mg Tabs (Sertraline hcl) .[name].. 1 1/2 tabs by mouth qd 25)  Promethazine-codeine 6.25-10 Mg/55ml Syrp (Promethazine-codeine) .[name].. 1 teaspoon  qid as needed cough  [facility]  Patient Instructions: 1)  Stop Lantus and Novolog. 2)  START Novolog Mix 70/30 - Take 28 units prior to Breakfast and 28 units prior to evening meal.  3)  WALK More - Daily  4)  Eat healthy. 5)  Return to Rx Clinic in 1 month.  Prescriptions: NOVOLOG MIX 70/30 FLEXPEN 70-30 % SUSP (INSULIN ASPART PROT & ASPART) 28 units prior and breakfast and 28 units prior to evening meal.  Dispense one month supply.  #1 x 5   Entered by:   Christian Mate D   Authorized by:   Denny Levy MD   Signed by:   Madelon Lips Pharm D on 03/09/2009   Method used:   Faxed to ...       [facility] (retail)       [address]       [place], [place]  [address]       Ph: [phone]       Fax: [phone]   RxID:   [id number]   Laboratory Results   Blood Tests   Date/Time Received: March 09, 2009 12:08 PM  Date/Time Reported: March 09, 2009 12:17 PM   HGBA1C: 6.6%   (Normal Range: Non-Diabetic - 3-6%   Control Diabetic - 6-8%)  Comments: ...........test performed by...........Marland KitchenTerese Door, CMA

## 2010-10-02 NOTE — Progress Notes (Signed)
  Phone Note Other Incoming   Caller: Patient Details for Reason: Burning in hands  Summary of Call: Pt called in with progressively worse burning in hands and arms, feels like her feet, known peripheral neuropathy unable to tolerate gabapentin. Burning sensation is very painful, tried cool showers, regular meds. No rash, no fever, no SOB, no chest pain, history of carpal tunnel but feels left hand weaker than normal s/p sugery in June. Advised her to go to [facility] to be seen as it sounds like her neuropathy but unsure why worsened today. She asked if something was worse would they send her ED, told her yes, states she only has one ride therefore would rather go to ED. Advised her of wait time if her issues are less urgent than others Initial call taken by: Milinda Antis MD,  May 19, 2010 3:13 PM     Appended Document:  Pt came to ER, seen by EDP, no acute problems, EDP to prescribe vicodin, told her as previous by other MD likley peripheral neuropathy. Will need follow-up in clinic this week. Will arrange for pt

## 2010-10-02 NOTE — Assessment & Plan Note (Signed)
Summary: F/U LAST VISIT/EO   Vital Signs:  Patient profile:   60 year old female Weight:      191.2 pounds Temp:     97.3 degrees F oral Pulse rate:   67 / minute Pulse rhythm:   regular BP sitting:   139 / 75  (left arm) Cuff size:   large  Vitals Entered By: Loralee Pacas CMA (July 26, 2009 9:56 AM) CC: follow-up visit for gout meds Is Patient Diabetic? Yes Did you bring your meter with you today? Yes   Primary Care Provider:  Denny Levy MD  CC:  follow-up visit for gout meds.  History of Present Illness: f/u left hand parasthsias--not muchbetter after starting gabapentin and wearing brace but doing both regularly.  New pain left heel--sharp pain feels like nail sticking in her foot. Better after she is up on it a while.  Food and her pills seems to be harder to swallow. Used to see Dr Christella Hartigan in ENT buthas new insurance and not sure who is on it as a provider.  Habits & Providers  Alcohol-Tobacco-Diet     Tobacco Status: never     Tobacco Counseling: not indicated; no tobacco use  Current Medications (verified): 1)  Novolog Mix 70/30 Flexpen 70-30 % Susp (Insulin Aspart Prot & Aspart) .... 28 Units Prior and Breakfast and 28 Units Prior To Evening Meal.  Dispense One Month Supply. 2)  Metformin Hcl 1000 Mg Tabs (Metformin Hcl) .... Take 1.5  Tablets By Mouth in in The Am and 1 Tablets in The Pm 3)  Potassium Chloride Crys Cr 20 Meq Cr-Tabs (Potassium Chloride Crys Cr) .... 2 in The Am and 1 in The Pm 4)  Hyzaar 100-25 Mg  Tabs (Losartan Potassium-Hctz) .[name].. 1 By Mouth Once Daily 5)  Lopressor 50 Mg Tabs (Metoprolol Tartrate) .... Two Times A Day 6)  Clonidine Hcl 0.1 Mg Tabs (Clonidine Hcl) .[name].. 1 Tablet By Mouth Twice A Day 7)  Lasix 20 Mg Tabs (Furosemide) .... Take 1 Tablet By Mouth Twice A Day 8)  Simvastatin 40 Mg Tabs (Simvastatin) .[name].. 1 By Mouth Once Daily 9)  Nexium 40 Mg Cpdr (Esomeprazole Magnesium) .[name].. 1 Capsule By Mouth Twice A Day 10)  Acyclovir 800  Mg Tabs (Acyclovir) .... Take 1 Tabletb Id/qd As Directed 11)  Lancets For Glucometer Testing 12)  Brovana 15 Mcg/72ml Nebu (Arformoterol Tartrate) .[name].. 1 Neb Every Twice Daily If Needed 13)  Bayer Aspirin 325 Mg Tabs (Aspirin) .... Take 1 Tablet By Mouth Once A Day 14)  Flexfot Cpap Mask 407 15)  Zyrtec Allergy 10 Mg  Tabs (Cetirizine Hcl) .[name].. 1 By Mouth At Bedtime 16)  Qvar 80 Mcg/act  Aers (Beclomethasone Dipropionate) .... 2 Puffs Twice Daily 17)  Xalatan 0.005 %  Soln (Latanoprost) .[name].. 1 Drop Each Eye At Bedtime 18)  Cpap 12 Advanced 19)  Allergy Vaccine Gh 1:50 20)  Allergy Vaccine Gh Advance To 1:10 Next Order .... Build Per Protocol 21)  Trusopt 2 % Soln (Dorzolamide Hcl) .[name].. 1 Drop in Left Eye Twice Daily 22)  Astepro 137 Mcg/spray Soln (Azelastine Hcl) .... 2 Sprays Daily As Needed 23)  Singulair 10 Mg Tabs (Montelukast Sodium) .... One By Mouth Qd 24)  Zoloft 100 Mg Tabs (Sertraline Hcl) .[name].. 1 1/2 Tabs By Mouth Qd 25)  Allopurinol 300 Mg Tabs (Allopurinol) .[name].. 1 Tablet Every Day Start On Sept.2nd 26)  Gabapentin 100 Mg Caps (Gabapentin) .... Sig 1 By Mouth At Bedtime 1 Week  Then Increase To Two By Mouth At Bedtime For 1 Week Then Increase To Three By Mouth At Bedtime.  Allergies: 1)  Darvon (Propoxyphene Hcl) 2)  Diovan (Valsartan) 3)  Flagyl (Metronidazole) 4)  Norvasc (Amlodipine Besylate) 5)  Prinzide (Lisinopril-Hydrochlorothiazide)  Physical Exam  General:  alert and well-developed.   Eyes:  severe nearsightedness Neck:  supple and full ROM.   Lungs:  normal breath sounds.   Heart:  normal rate and regular rhythm.   Msk:  left hand + tinelss. no thenar atrophy. normal grip strength. Decreased sensation thumb through ring finger left hand esp at finger tips.  left foot tender at origin plantar fascia. 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 28 units prior and breakfast and 28 units prior to evening meal.  dispense one month supply. 2)  Metformin Hcl 1000 Mg Tabs (Metformin hcl) .... Take 1.5  tablets by mouth in in the am and 1 tablets in the pm 3)  Potassium Chloride Crys Cr 20 Meq Cr-tabs (Potassium chloride crys cr) .... 2 in the am and 1 in the pm 4)  Hyzaar 100-25 Mg Tabs (Losartan potassium-hctz) .[name].. 1 by mouth once daily 5)  Lopressor 50 Mg Tabs (Metoprolol tartrate) .... Two times a day 6)  Clonidine Hcl 0.1 Mg Tabs (Clonidine hcl) .[name].. 1 tablet by mouth twice a day 7)  Lasix 20 Mg Tabs (Furosemide) .... Take 1 tablet by mouth twice a day 8)  Simvastatin 40 Mg Tabs (Simvastatin) .[name].. 1 by mouth once daily 9)  Nexium 40 Mg Cpdr (Esomeprazole magnesium) .[name].. 1 capsule by mouth twice a day 10)  Acyclovir 800 Mg Tabs (Acyclovir) .... Take 1 tabletb id/qd as directed 11)  Lancets For Glucometer Testing  12)  Brovana 15 Mcg/14ml Nebu (Arformoterol tartrate) .[name].. 1 neb every twice daily if needed 13)  Bayer Aspirin 325 Mg Tabs (Aspirin) .... Take 1 tablet by mouth once a day 14)  Flexfot Cpap Mask 407  15)  Zyrtec Allergy 10 Mg Tabs (Cetirizine hcl) .[name].. 1 by mouth at bedtime 16)  Qvar 80 Mcg/act Aers (Beclomethasone dipropionate) .... 2 puffs twice daily 17)  Xalatan 0.005 % Soln (Latanoprost) .[name].. 1 drop each eye at bedtime 18)  Cpap 12 Advanced  19)  Allergy Vaccine Gh 1:50  20)  Allergy Vaccine Gh Advance To 1:10 Next Order   .... Build per protocol 21)  Trusopt 2 % Soln (Dorzolamide hcl) .[name].. 1 drop in left eye twice daily 22)  Astepro 137 Mcg/spray Soln (Azelastine hcl) .... 2 sprays daily as needed 23)  Singulair 10 Mg Tabs (Montelukast sodium) .... One by mouth qd 24)  Zoloft 100 Mg Tabs (Sertraline hcl) .[name].. 1 1/2 tabs by mouth qd 25)  Allopurinol 300 Mg Tabs (Allopurinol) .[name].. 1 tablet every day start on  sept.2nd 26)  Gabapentin 300 Mg Caps (Gabapentin) .[name].. 1 by mouth bid  Patient Instructions: 1)  Please schedule Ms Timberman into my [facility] at 2 pm--OK double book Prescriptions: GABAPENTIN 300 MG CAPS (GABAPENTIN) 1 by mouth bid  #60 x 1   Entered and Authorized by:   Denny Levy MD   Signed by:   Denny Levy MD on 07/26/2009   Method used:   Historical   RxID:   [id number]

## 2010-10-02 NOTE — Assessment & Plan Note (Signed)
Summary: FU///KP    Visit Type:  Follow-up PCP:  Denny Levy MD  Chief Complaint:  office visit...still coughing ...reviewed meds....  History of Present Illness: Continues to have intermittent breakthrough cough. Saw TP & then Dr. Maple Hudson & has satrted on allergy shots     Current Allergies: DARVON (PROPOXYPHENE HCL) DIOVAN (VALSARTAN) FLAGYL (METRONIDAZOLE) METOPROLOL TARTRATE (METOPROLOL TARTRATE) NORVASC (AMLODIPINE BESYLATE) PRINZIDE (LISINOPRIL-HYDROCHLOROTHIAZIDE)  Past Medical History:    Reviewed history from 10/28/2007 and no changes required:              Breast lumpectomy       Reduction mamoplasty       eval for vocal cord paralysios/olyp                       Past Surgical History:    Reviewed history from 10/28/2007 and no changes required:       Anal fistula repair -,        Arthroscopy--R shoulder/R knee 12/01 - 10/07/2000,        breast reduction - 04/21/2003,        BTL--`87 -,        cardiac Cath--nl ef, no cad - 10/04/2003, echo--tricus regurg, mild pul htn, ef 60 - 03/02/2004,        ENT Naso-pharyn scope-normal - 09/11/2006,        Hysterectomy - 10/07/2000, l       . rotator cuff surgery - 10/18/2003,        T-score: -.0.54 (low risk currently) - 09/02/2004       bronchoscopy for chronic cough 07/2007     Review of Systems       The patient complains of prolonged cough.  The patient denies anorexia, fever, weight loss, weight gain, vision loss, decreased hearing, hoarseness, chest pain, syncope, dyspnea on exhertion, peripheral edema, hemoptysis, abdominal pain, melena, hematochezia, severe indigestion/heartburn, hematuria, incontinence, genital sores, muscle weakness, suspicious skin lesions, transient blindness, difficulty walking, depression, unusual weight change, abnormal bleeding, enlarged lymph nodes, angioedema, breast masses, and testicular masses.     Vital Signs:  Patient Profile:   60 Years Old Female Height:     61.5 inches (156.21 cm)  Weight:      185.4 pounds BMI:     34.59 O2 Sat:      98 % O2 treatment:    Room Air Temp:     97.8 degrees F oral Pulse rate:   96 / minute BP sitting:   140 / 80  (left arm) Cuff size:   regular  Pt. in pain?   no  Vitals Entered By: Clarise Cruz Duncan Dull) (November 09, 2007 2:19 PM)                  Physical Exam  General:     HEENT - no thrush, no post nasal drip No JVD, no lymphadenopathy CVS- s1s2 nml, no murmur RS- clear, no crackles or rhonchi Abd- soft, non-tender, no organomegaly CNS- non focal Ext - no edema      Impression & Recommendations:  Problem # 1:  COUGH (ICD-786.2) this has been persistent for 20 years.  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  a pH probe on Nexium and Reglan showed some breakthrough reflux, but I was not impressed with this. d/c reglan - discussed tardive dyskinesia  Chest CT in 3/08 did not show any evidence of fibrosis. Spirometry has shown restriction without any evidence of obstruction, which is why  I have not pursued.  A methacholine challenge.  Bronchoscopy with transbronchial biopsies have been nondiagnostic.  She had marginal improvement with antihistaminic/decongestant therapy.  Pseudoephedrine made her heart race as did bronchodilators and Advair.in summary, I discussed with [name] [name] that she may very well may have to just have to endure this problem. if she has flareups.  We will try to give her oral steroids. We will maintain her on a regimen of Qvar 80 micrograms two pffs two times a day, Nexium and singulair. Spiriva will be discontinued. She will use Flonase daily, and promethazine with codeine syrup on a p.r.n. basis only.  Orders: Est. Patient Level III (29528)   Problem # 2:  ALLERGIC  RHINITIS (ICD-477.9)  Her updated medication list for this problem includes:    Zyrtec Allergy 10 Mg Tabs (Cetirizine hcl) .[name]... 1 by mouth at bedtime  Orders: Est. Patient Level III (41324)    Patient  Instructions: 1)  Please schedule a follow-up appointment in 6 months.    ]

## 2010-10-02 NOTE — Progress Notes (Signed)
Summary: refill  Medications Added POTASSIUM CHLORIDE CRYS CR 20 MEQ CR-TABS (POTASSIUM CHLORIDE CRYS CR) 1 by mouth once daily please dispense capsules if available       Phone Note Call from Patient Call back at Home Phone [phone]   Caller: Patient Summary of Call: needs to speak w/ nurse about potassium - would prefer capsules instead of pill form [address] Initial call taken by: De Nurse,  July 18, 2008 4:17 PM    New/Updated Medications: POTASSIUM CHLORIDE CRYS CR 20 MEQ CR-TABS (POTASSIUM CHLORIDE CRYS CR) 1 by mouth once daily please dispense capsules if available   Prescriptions: POTASSIUM CHLORIDE CRYS CR 20 MEQ CR-TABS (POTASSIUM CHLORIDE CRYS CR) 1 by mouth once daily please dispense capsules if available  #90 x 3   Entered by:   Denny Levy MD   Authorized by:   [name] RED TEAM-[facility]   Signed by:   Denny Levy MD on 07/19/2008   Method used:   Telephoned to ...       [facility] / [facility] (retail)       [address]       [place], [place]  [address]       Ph: [phone]       Fax: [phone]   RxID:   [id number]  DEAR RED TEAM:  ok please call pharmacy and tell them capsules if they have them Thanks!  Huntley Dec NEAL MD  July 19, 2008 10:09 AM   Spoke with pharmacist who knows pt quite well.  He sts that he has already explained to her that capsules are not avalible in this med.  Advised that he will call and explain again.......................................... [name] [name] [facility] CMA  July 19, 2008 11:13 AM  Appended Document: refill [phone], would like to discuss w rn  Appended Document: refill phone call complete

## 2010-10-02 NOTE — Progress Notes (Signed)
  Medications Added ACYCLOVIR 800 MG TABS (ACYCLOVIR) Take 1 tabletb id/qd as directed       Phone Note Outgoing Call   Summary of Call: please call rx below in to [facility] thanks  ...................................................................SARA NEAL MD  October 28, 2007 11:08 AM   Follow-up for Phone Call        LMOVM that "a Rx was refilled at [facility] drug for her"   Follow-up by: [facility] CMA,  October 28, 2007 11:49 AM    New/Updated Medications: ACYCLOVIR 800 MG TABS (ACYCLOVIR) Take 1 tabletb id/qd as directed   Prescriptions: ACYCLOVIR 800 MG TABS (ACYCLOVIR) Take 1 tabletb id/qd as directed  #60 x 12   Entered and Authorized by:   Denny Levy MD   Signed by:   Denny Levy MD on 10/28/2007   Method used:   Telephoned to ...       [facility] / [facility]       [address]       [address]       [place], [place]  [address]       Ph: [phone]       Fax: [phone]   RxID:   [id number]

## 2010-10-02 NOTE — Assessment & Plan Note (Signed)
Summary: b12 shot,df  Nurse Visit   Vital Signs:  Patient profile:   60 year old female Weight:      195.1 pounds Temp:     98.6 degrees F oral Pulse rate:   74 / minute BP sitting:   131 / 82  (left arm) Cuff size:   large  Vitals Entered By: Arlyss Repress CMA, (Jan 04, 2010 9:24 AM)  Primary Care Provider:  Denny Levy MD  CC:  numbness and swelling of left hand x 2-3 months. unable to use hand.  History of Present Illness: [name] [name] comes in for persistent numbness and now swelling of her left hand.  She has been followed for decreased left hand sensation by her PCP - see previous notes.  Had peripheral nerve conduction studies that show median nerve entrapment at the carpal tunnel and ulnar nerve entrapment in the elbow.  Has been referred to ortho for eval but is awaiting insurance approval.  Was placed on neurontin but she didn't tolerate it.  Has been going on for 6 months.  Drops things with the left hand.  Has difficulty buttoning thing, tying her shoes, putting on bra due to decreased sensation and "inability to use it".  Now feeling like its swollen.  No known injury.  No rednes or erythema.  No fevers.    Physical Exam  General:  alert and well-developed.  obese. Vitals reviewed Msk:  left hand loss sensation to soft touch palm and all fingers and thumb from mid palm distally. no thenar or hypeothenar atrophy right hand has some decreased soft touch sensation median nerve distribution . grip strength seems 2+ B equal. no visible swelling or erythema Pulses:  2+ radial pulses bilaterally   Impression & Recommendations:  Problem # 1:  NUMBNESS, HAND (ICD-782.0) Assessment Unchanged  Numbness is not new and PCP has work-up/eval/treatment in process.  STrength is fine in the left hand - problem is that since she can't feel she can't judge her grip strength or what she is touchign, making carrying things, tying shoes, and buttoning difficult with that hand.  Also suspect the  "swelling" is more a sensation/perception to the patient.  Since sensation decresed, likely feeling stiff, full, heavy  - or swollen.  Not objectively swollen today.  Discussed this with patient and that there is nothing new I can add to current plan today.  Patient expressed understanding and will keeep currently scheduled follow-up with her PCP>   Orders: [facility]- Est Level  3 (99213)  Complete Medication List: 1)  Novolog Mix 70/30 Flexpen 70-30 % Susp (Insulin aspart prot & aspart) .... 28 units prior and breakfast and 28 units prior to evening meal.  dispense one month supply. 2)  Metformin Hcl 1000 Mg Tabs (Metformin hcl) .... Take 1.5  tablets by mouth in in the am and 1 tablets in the pm 3)  Potassium Chloride 40 Meq/51ml (20%) Liqd (Potassium chloride) .[name].. 15 cc two times a day disp qs 3 m 4)  Hyzaar 100-25 Mg Tabs (Losartan potassium-hctz) .[name].. 1 by mouth once daily 5)  Lopressor 50 Mg Tabs (Metoprolol tartrate) .... Two times a day 6)  Clonidine Hcl 0.1 Mg Tabs (Clonidine hcl) .[name].. 1 tablet by mouth twice a day 7)  Lasix 20 Mg Tabs (Furosemide) .... Take 1 tablet by mouth twice a day 8)  Simvastatin 40 Mg Tabs (Simvastatin) .[name].. 1 by mouth once daily 9)  Nexium 40 Mg Cpdr (Esomeprazole magnesium) .[name].. 1 capsule by  mouth twice a day 10)  Acyclovir 800 Mg Tabs (Acyclovir) .... Take 1 tabletb id/qd as directed 11)  Lancets For Glucometer Testing  12)  Brovana 15 Mcg/25ml Nebu (Arformoterol tartrate) .[name].. 1 neb every twice daily if needed 13)  Bayer Aspirin 325 Mg Tabs (Aspirin) .... Take 1 tablet by mouth once a day 14)  Flexfot Cpap Mask 407  15)  Zyrtec Allergy 10 Mg Tabs (Cetirizine hcl) .[name].. 1 by mouth at bedtime 16)  Qvar 80 Mcg/act Aers (Beclomethasone dipropionate) .... 2 puffs twice daily 17)  Xalatan 0.005 % Soln (Latanoprost) .[name].. 1 drop each eye at bedtime 18)  Cpap 12 Advanced  19)  Allergy Vaccine Gh 1:10  20)  Trusopt 2 % Soln (Dorzolamide hcl) .[name].. 1 drop in left eye twice  daily 21)  Astepro 137 Mcg/spray Soln (Azelastine hcl) .... 2 sprays daily as needed 22)  Singulair 10 Mg Tabs (Montelukast sodium) .... One by mouth qd 23)  Zoloft 100 Mg Tabs (Sertraline hcl) .[name].. 1 1/2 tabs by mouth qd 24)  Gabapentin 300 Mg Caps (Gabapentin) .[name].. 1 by mouth bid 25)  Allopurinol 300 Mg Tabs (Allopurinol) .[name].. 1 by mouth qd  Other Orders: Vit B12 1000 mcg ([id number]) Admin of Injection (IM/SQ) ([id number])  CC: numbness and swelling of left hand x 2-3 months. unable to use hand Is Patient Diabetic? Yes Pain Assessment Patient in pain? 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)    Route: IM    Site: L deltoid    Exp Date: 08/2011    Lot #: [id number]    Mfr: [employer] Regent    Patient tolerated injection without complications    Given by: Theresia Lo RN (Jan 04, 2010 9:19 AM)  Orders Added: 1)  Vit B12 1000 mcg [J3420] 2)  Admin of Injection (IM/SQ) [96372] 3)  [facility]- Est Level  3 [95284]   Medication Administration  Injection # 1:    Medication: Vit B12 1000 mcg    Diagnosis: ULNAR NEUROPATHY, 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 she is having trouble using hands. appointment made for [place] appointment today. Theresia Lo RN  Jan 04, 2010 9:20 AM

## 2010-10-02 NOTE — Miscellaneous (Signed)
Summary: checked blood sugar at time of allergy shot - okay  Clinical Lists Changes   Pt came for allergy injection on April 24 and felt her sugar was low. Checked blood sugar - 186. Per Dr. Vassie Loll, let patient sit and wait for 30 minutes. Pt left saying she felt better.  ..................................................................Marland KitchenMarinus Maw  December 29, 2007 8:47 AM

## 2010-10-02 NOTE — Letter (Signed)
Summary: Surgery ClearanceLetter  [facility]  [address]   [place], [place] [address]   Phone: [phone]  Fax: [phone]    02/06/2010  Thank you in advance for agreeing to see my patient:  [name] [name] [address] [name] [name] [place], [place]  [address]  Phone: [phone] Blu Mcglaun is cleared for orthopedic surgery on her hand and upper extremity. Please note that she has vocal cord paralysis and this should be made known to anasthesia at the time of her surgery. her current problems and medicines are listed below.  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 of ASTHMA (ICD-493.90) 28)  GLAUCOMA (ICD-365.9) 29)  DEPRESSION, MAJOR, RECURRENT (ICD-296.30) 30)  CATARACT EXTRACTION STATUS (ICD-V45.61)    Current Medications: 1)  NOVOLOG MIX 70/30 FLEXPEN 70-30 % SUSP (INSULIN ASPART PROT & ASPART) 28 units prior and breakfast and 28 units prior to evening meal.  Dispense one month supply. 2)  METFORMIN HCL 1000 MG TABS (METFORMIN HCL) Take 1.5  tablets by mouth in in the AM and 1 tablets in the PM 3)  POTASSIUM CHLORIDE 40 MEQ/15ML (20%) LIQD (POTASSIUM CHLORIDE) 15 cc two times a day disp qs 3 m 4)  HYZAAR 100-25 MG  TABS (LOSARTAN POTASSIUM-HCTZ) 1 by mouth once  daily 5)  LOPRESSOR 50 MG TABS (METOPROLOL TARTRATE) two times a day 6)  CLONIDINE HCL 0.1 MG TABS (CLONIDINE HCL) 1 tablet by mouth twice a day 7)  LASIX 20 MG TABS (FUROSEMIDE) Take 1 tablet by mouth twice a day 8)  SIMVASTATIN 40 MG TABS (SIMVASTATIN) 1 by mouth once daily 9)  NEXIUM 40 MG CPDR (ESOMEPRAZOLE MAGNESIUM) 1 capsule by mouth twice a day 10)  ACYCLOVIR 800 MG TABS (ACYCLOVIR) Take 1 tabletb id/qd as directed  12)  BROVANA 15 MCG/2ML NEBU (ARFORMOTEROL TARTRATE) 1 neb every twice daily if needed 13)  BAYER ASPIRIN 325 MG TABS (ASPIRIN) Take 1 tablet by mouth once a day 14)  * FLEXFOT CPAP MASK 407  15)  ZYRTEC ALLERGY 10 MG  TABS (CETIRIZINE HCL) 1 by mouth at bedtime on HOLD per Dr Maple Hudson 16)  QVAR 80 MCG/ACT  AERS (BECLOMETHASONE DIPROPIONATE) 2 puffs twice daily 17)  XALATAN 0.005 %  SOLN (LATANOPROST) 1 drop each eye at bedtime 18)  * CPAP 12 ADVANCED  19)  * ALLERGY VACCINE GH 1:10  20)  TRUSOPT 2 % SOLN (DORZOLAMIDE HCL) 1 drop in left eye twice daily 21)  ASTEPRO 137 MCG/SPRAY SOLN (AZELASTINE HCL) 2 sprays daily as needed 22)  SINGULAIR 10 MG TABS (MONTELUKAST SODIUM) one by mouth qd 23)  ZOLOFT 100 MG TABS (SERTRALINE HCL) 1 1/2 tabs by mouth qd 24)  GABAPENTIN 300 MG CAPS (GABAPENTIN) 1 by mouth bid 25)  ALLOPURINOL 300 MG TABS (ALLOPURINOL) 1 by mouth qd        Thank you again for agreeing to see our patient; please contact us if you have  any further questions or need additional information.  Sincerely,  Denny Levy MD

## 2010-10-02 NOTE — Assessment & Plan Note (Signed)
Summary: F/U  DPG    Vital Signs:  Patient Profile:   60 Years Old Female Height:     61.5 inches (156.21 cm) Weight:      182 pounds (82.73 kg) BMI:     33.95 Temp:     98.1 degrees F (36.72 degrees C) Pulse rate:   79 / minute BP sitting:   117 / 69  (left arm)  Vitals Entered By: Tomasa Rand (August 05, 2007 11:36 AM)             Is Patient Diabetic? Yes      Chief Complaint:  F/U from last visit. Diabetes follow up with blood sugars in    good control,   no episodes of low blood sugar. 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 last A1C was 6.9. Sugars reviewed today and appropriate. Follow up hypertension. Taking medicines regularly with no problems. Not having any any headaches or chest pains. Cough is still her biggest issue and she had bronchoscopy last week--results pending. Has chronic rib pain laterally from constant cough. Nothing she has tried has helped this. Hx of paralyzed vocal cord.   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 of ASTHMA (ICD-493.90)       IRRITABLE BOWEL SYNDROME (ICD-564.1)       HYPERCHOLESTEROLEMIA (ICD-272.0)       GLAUCOMA (ICD-365.9)       DEPRESSION, MAJOR, RECURRENT (ICD-296.30)                            Bi  Past Surgical History:    Anal fistula repair -,     Arthroscopy--R shoulder/R knee 12/01 - 10/07/2000,     breast reduction  - 04/21/2003,     BTL--`87 -,     cardiac Cath--nl ef, no cad - 10/04/2003, echo--tricus regurg, mild pul htn, ef 60 - 03/02/2004,     ENT Naso-pharyn scope-normal - 09/11/2006,     Hysterectomy - 10/07/2000, l    . rotator cuff surgery - 10/18/2003,     T-score: -.0.54 (low risk currently) - 09/02/2004    bronchoscopy for chronic cough 07/2007      Physical Exam  General:     alert and well-developed.   Eyes:     left eye resting nystagmus. poor vision B. Neck:     supple, full ROM, and no masses.   Lungs:     Normal respiratory effort, chest expands symmetrically. Lungs are clear to auscultation, no crackles or wheezes. Heart:     Normal rate and regular rhythm. S1 and S2 normal without gallop, murmur, click, rub or other extra sounds. Abdomen:     soft, non-tender, and normal bowel sounds.   Psych:     Oriented X3, normally interactive, good eye contact, and not depressed appearing.  Impression & Recommendations:  Problem # 1:  COUGH (ICD-786.2) Assessment: Unchanged  Orders: [facility]- Est  Level 4 ([address])   Problem # 2:  DIABETES MELLITUS II, UNCOMPLICATED (ICD-250.00) Assessment: Improved  Her updated medication list for this problem includes:    Lantus Solostar 100 Unit/ml Soln (Insulin glargine) .[name]... As directed 20 units each morning    Hyzaar 100-25 Mg Tabs (Losartan potassium-hctz) .[name]... 1 by mouth once daily    Metformin Hcl 1000 Mg Tabs (Metformin hcl) .[name]... Take 1 tablet by mouth twice a day    Bayer Aspirin 325 Mg Tabs (Aspirin) .[name]... Take 1 tablet by mouth once a day    Novolog Flexpen 100 Unit/ml Soln (Insulin aspart) .Marland KitchenMarland KitchenMarland KitchenMarland Kitchen 6 units prior to breakfast 8 units prior to evening meal supply one box of pens  Orders: [facility]- Est  Level 4 (99214)   Problem # 3:  HYPERTENSION, BENIGN SYSTEMIC (ICD-401.1) Assessment: Improved  Her updated medication list for this problem includes:    Hyzaar 100-25 Mg Tabs (Losartan potassium-hctz) .[name]... 1 by mouth once daily     Metoprolol Succinate 25 Mg Tb24 (Metoprolol succinate) .[name]... Take one tab  two times a day    Clonidine Hcl 0.1 Mg Tabs (Clonidine hcl) .[name]... 1 tablet by mouth twice a day    Lasix 20 Mg Tabs (Furosemide) .[name]... Take 1 tablet by mouth twice a day  Orders: [facility]- Est  Level 4 (60454)   Complete Medication List: 1)  Lantus Solostar 100 Unit/ml Soln (Insulin glargine) .... As directed 20 units each morning 2)  Hyzaar 100-25 Mg Tabs (Losartan potassium-hctz) .[name].. 1 by mouth once daily 3)  Metoprolol Succinate 25 Mg Tb24 (Metoprolol succinate) .... Take one tab  two times a day 4)  Clonidine Hcl 0.1 Mg Tabs (Clonidine hcl) .[name].. 1 tablet by mouth twice a day 5)  Crestor 20 Mg Tabs (Rosuvastatin calcium) .... Take 1 tablet by mouth once a day 6)  Lasix 20 Mg Tabs (Furosemide) .... Take 1 tablet by mouth twice a day 7)  Metformin Hcl 1000 Mg Tabs (Metformin hcl) .... Take 1 tablet by mouth twice a day 8)  Nexium 40 Mg Cpdr (Esomeprazole magnesium) .[name].. 1 capsule by mouth twice a day 9)  Reglan 10 Mg Tabs (Metoclopramide hcl) .... Take 1 tablet by mouth twice a day 10)  Singulair 10 Mg Tabs (Montelukast sodium) .... Take 1 tablet by mouth once a day 11)  Spiriva Handihaler 18 Mcg Caps (Tiotropium bromide monohydrate) .... Inhale 1 capsule by mouth once a day 12)  Sucralfate 1 Gm Tabs (Sucralfate) .... Take 1 tablet by mouth four times a day 13)  Nitroglycerin 0.4 Mg Subl (Nitroglycerin) .... Place 1 tablet under tongue as directed 14)  Acyclovir 800 Mg Tabs (Acyclovir) .... Take 1 tablet daily 15)  Albuterol Sulfate 1.25 Mg/19ml Nebu (Albuterol sulfate) .... Inhale 1 vial as directed twice a day 16)  Bayer Aspirin 325 Mg Tabs (Aspirin) .... Take 1 tablet by mouth once a day 17)  Proventil Hfa 108 (90 Base) Mcg/act Aers (Albuterol sulfate) .... Inhale 2 puff using inhaler every four hours 18)  Flexfot Cpap Mask 407  19)  Lancets For Glucometer Testing  20)  Novolog Flexpen 100 Unit/ml Soln (Insulin  aspart) .... 6 units prior to breakfast 8 units prior to evening meal supply one box of pens 21)  Potassium Chloride 20 Meq Pack (Potassium chloride) .... One daily   Patient Instructions: 1)  I am pleased w her A1C and  BP nuumbers. Will check A1C in 3 m as well as Bmet for k and Cr as we have made some changes. 2)  Please schedule a follow-up appointment in 3 months.    ]

## 2010-10-02 NOTE — Assessment & Plan Note (Signed)
Summary: F/U COPD [name]   Vital Signs:  Patient Profile:   60 Years Old Female Weight:      212.5 pounds O2 Sat:      99 % Temp:     98 degrees F oral Pulse rate:   85 / minute BP sitting:   122 / 72  Pt. in pain?   no  Vitals Entered By: Arlyss Repress CMA, (November 25, 2006 2:18 PM)                PCP:  Albertha Ghee MD  Chief Complaint:  f/up persistent cough.  History of Present Illness: cough for 6 months.  seen pulm and ent.  Possible reflux.  Tried mult medication changes without relief.  No fever or chills.  Some nasal drainage.  Some heartburn, but better on two times a day nexium.  Asthma meds changed to simbicort and spiriva.   No CP or sob.  Some coughing fits but no vomiting.    BP and CBG are good.  Brought 3 month record.  Most sbp under 130.  most fasting sugars below 130.    Still with le edema.  Wearing ted hose-- helps some.  No calf pain.    Diabetes Management History:      She has not been enrolled in the "Diabetic Education Program".  She states understanding of dietary principles but she is not following the appropriate diet.  No sensory loss is reported.  Self foot exams are being performed.  She is checking home blood sugars.  She says that she is not exercising regularly.        Hypoglycemic symptoms are not occurring.  No hyperglycemic symptoms are reported.    Hypertension History:      She denies headache, chest pain, palpitations, dyspnea with exertion, orthopnea, PND, visual symptoms, neurologic problems, syncope, and side effects from treatment.  She notes no problems with any antihypertensive medication side effects.        Positive major cardiovascular risk factors include diabetes, hyperlipidemia, and hypertension.  Negative major cardiovascular risk factors include female age less than 22 years old.        Past Medical History:    Reviewed history from 10/30/2006 and no changes required:       Bilateral sensorineural hearing loss, chronic  pelvic pain, h/o right hip shingles, heart mumur--systolic ejection, mod sleep apnea--cpap at 12 --start 12/06, Recurrent yeast vaginitis    Risk Factors:  Exercise:  no    Physical Exam  General:     Well-developed,well-nourished,in no acute distress; alert,appropriate and cooperative throughout examination Head:     Normocephalic and atraumatic without obvious abnormalities. No apparent alopecia or balding. Neck:     No deformities, masses, or tenderness noted. Chest Wall:     No deformities, masses, or tenderness noted. Breasts:     No mass, nodules, thickening, tenderness, bulging, retraction, inflamation, nipple discharge or skin changes noted.   Lungs:     normal respiratory effort, no intercostal retractions, no accessory muscle use, no crackles, and no wheezes.    Significant coughing throughout visit today.   Heart:     normal rate, regular rhythm, and Grade   2/6 systolic ejection murmur.   Abdomen:     soft, non-tender, and normal bowel sounds.   Pulses:     R and L carotid,radial,femoral,dorsalis pedis and posterior tibial pulses are full and equal bilaterally Extremities:     2+ left pedal edema and 2+ right pedal  edema.   Neurologic:     No cranial nerve deficits noted. Station and gait are normal. Plantar reflexes are down-going bilaterally. DTRs are symmetrical throughout. Sensory, motor and coordinative functions appear intact. Cervical Nodes:     No lymphadenopathy noted    Impression & Recommendations:  Problem # 1:  COUGH, CHRONIC (ICD-786.2) Seeing pulm and ENT--thought to be gerd.  Changed from metoprolol to diovan.  Added reglan, nexium two times a day and nasal steroid.  Asthma meds changed to Symbacort and spiriva.  Trial of steriods and zegerid--stopped.  Still cough is unchanged.    Although not likely, will treat for pertussis--Zpack.  Check CT of chest.  Doubt this is pulm edema but will try a trial off actos.  If not improved consider  sulcralfate? since thought to be gerd by specialists.  Orders: CT with Contrast (CT w/ contrast) [facility]- Est  Level 4 (40981)   Problem # 2:  DIABETES MELLITUS II, UNCOMPLICATED (ICD-250.00) Dc actosplusmet.  Change to metformin 1000 mg two times a day.  check sugars.  Add sulfonyurea if needed.  aic 5.5  today. Orders: Comp Met-[facility] ([id number]) A1C-[facility] ([id number]) [facility]- Est  Level 4 (65784)   Problem # 3:  HYPERTENSION, BENIGN SYSTEMIC (ICD-401.1) Now on diovan, hctz and norvasc.  Consider dc diovan if cough not improving.  Having le edema so may taper off norvasc at next office visit.  Avoid bblockers with asthma.  May use clonidine next.  Orders: Comp Met-[facility] [id number])   Problem # 4:  ANKLE EDEMA (ICD-782.3) See above.  Dc actos.  Taper off norvasc at next visit if not improved.  Echo with nl ef at [place].  Check tsh   Orders: TSH-[facility] ([id number]) [facility]- Est  Level 4 (32440)   Diabetes Management Assessment/Plan:      The following lipid goals have been established for the patient: Total cholesterol goal of 200; LDL cholesterol goal of 100; HDL cholesterol goal of 40; Triglyceride goal of 200.    Hypertension Assessment/Plan:      The patient's hypertensive risk group is category C: Target organ damage and/or diabetes.  Her calculated 10 year risk of coronary heart disease is 11 %.  Today's blood pressure is 122/72.     Patient Instructions: 1)  Azithromycin 250- 2 pills day one then 1 pill daily for 4 days. 2)  Ct scan per nurse 3)  Stop actosplusmet.  Start metformin 1000 mg two times a day  4)  Follow up in 3 weeks. 5)  sched. Ct scan of chest with contrast at [facility] 11[facility]28-[facility] at 8am. pt to arrive at 7:45 am. faxed order. informed pt   Laboratory Results   Blood Tests   Date/Time Recieved: November 25, 2006 3:17 PM  Date/Time Reported: November 25, 2006 3:29 PM   HGBA1C: 5.5%   (Normal Range: Non-Diabetic - 3-6%   Control Diabetic - 6-8%)  Comments:  ...................................................................DONNA [facility]  November 25, 2006 3:29 PM

## 2010-10-02 NOTE — Assessment & Plan Note (Signed)
Summary: numbness in toes,df   Vital Signs:  Patient profile:   60 year old female Height:      61.5 inches Weight:      189 pounds BMI:     35.26 Temp:     97.9 degrees F oral Pulse rate:   83 / minute BP sitting:   124 / 78  (left arm) Cuff size:   large  Vitals Entered By: Tessie Fass CMA (February 09, 2010 10:06 AM) CC: numbness in both feet Is Patient Diabetic? Yes Pain Assessment Patient in pain? no        Primary Care Provider:  Denny Levy MD  CC:  numbness in both feet.  History of Present Illness: Feet are numb and legs hurt when she walks.  This has been going on for some time.  Pain in legs with walking is mostly in thighs but sometimes in her calf.  Denies improvement with rest.  She would like something to treat the discomfort in her hands, tried gabapentin but made her feel weird.  She has CTS surgery schedluled in 2 weeks.  The wrist brace does not help.  Habits & Providers  Alcohol-Tobacco-Diet     Tobacco Status: never  Current Medications (verified): 1)  Novolog Mix 70/30 Flexpen 70-30 % Susp (Insulin Aspart Prot & Aspart) .... 28 Units Prior and Breakfast and 28 Units Prior To Evening Meal.  Dispense One Month Supply. 2)  Metformin Hcl 1000 Mg Tabs (Metformin Hcl) .... Take 1.5  Tablets By Mouth in in The Am and 1 Tablets in The Pm 3)  Potassium Chloride 40 Meq/10ml (20%) Liqd (Potassium Chloride) .[name].. 15 Cc Two Times A Day Disp Qs 3 M 4)  Hyzaar 100-25 Mg  Tabs (Losartan Potassium-Hctz) .[name].. 1 By Mouth Once Daily 5)  Metoprolol Succinate 50 Mg Xr24h-Tab (Metoprolol Succinate) .... Take One Tablet Two Times A Day 6)  Clonidine Hcl 0.1 Mg Tabs (Clonidine Hcl) .[name].. 1 Tablet By Mouth Twice A Day 7)  Lasix 20 Mg Tabs (Furosemide) .... Take 1 Tablet By Mouth Twice A Day 8)  Simvastatin 40 Mg Tabs (Simvastatin) .[name].. 1 By Mouth Once Daily 9)  Nexium 40 Mg Cpdr (Esomeprazole Magnesium) .[name].. 1 Capsule By Mouth Twice A Day 10)  Acyclovir 800 Mg Tabs (Acyclovir)  .... Take 1 Tabletb Id/qd As Directed 11)  Lancets For Glucometer Testing 12)  Brovana 15 Mcg/78ml Nebu (Arformoterol Tartrate) .[name].. 1 Neb Every Twice Daily If Needed 13)  Bayer Aspirin 325 Mg Tabs (Aspirin) .... Take 1 Tablet By Mouth Once A Day 14)  Flexfot Cpap Mask 407 15)  Zyrtec Allergy 10 Mg  Tabs (Cetirizine Hcl) .[name].. 1 By Mouth At Bedtime On Hold Per Dr Maple Hudson 16)  Qvar 80 Mcg/act  Aers (Beclomethasone Dipropionate) .... 2 Puffs Twice Daily 17)  Xalatan 0.005 %  Soln (Latanoprost) .[name].. 1 Drop Each Eye At Bedtime 18)  Cpap 12 Advanced 19)  Allergy Vaccine Gh 1:10 20)  Trusopt 2 % Soln (Dorzolamide Hcl) .[name].. 1 Drop in Left Eye Twice Daily 21)  Astepro 137 Mcg/spray Soln (Azelastine Hcl) .... 2 Sprays Daily As Needed 22)  Singulair 10 Mg Tabs (Montelukast Sodium) .... One By Mouth Qd 23)  Zoloft 100 Mg Tabs (Sertraline Hcl) .[name].. 1 1/2 Tabs By Mouth Qd 24)  Gabapentin 300 Mg Caps (Gabapentin) .[name].. 1 By Mouth Bid 25)  Allopurinol 300 Mg Tabs (Allopurinol) .[name].. 1 By Mouth Qd 26)  Prednisone 20 Mg Tabs (Prednisone) .[name].. 1 By Mouth  Once Daily For 5 Days 27)  Diabetic Shoes .... Patient With Peripheral Neuropathy and Loss of Sensation in Feet, Some Callouses  Allergies (verified): 1)  Darvon 2)  Diovan (Valsartan) 3)  Flagyl (Metronidazole) 4)  Norvasc (Amlodipine Besylate) 5)  Prinzide  Physical Exam  General:  Usual appearing  Diabetes Management Exam:    Foot Exam (with socks and/or shoes not present):       Sensory-Pinprick/Light touch:          Left medial foot (L-4): absent          Left dorsal foot (L-5): absent          Left lateral foot (S-1): absent          Right medial foot (L-4): absent          Right dorsal foot (L-5): absent          Right lateral foot (S-1): absent       Sensory-Monofilament:          Left foot: absent          Right foot: absent       Inspection:          Left foot: abnormal             Comments: flat          Right foot: abnormal              Comments: flat       Nails:          Left foot: normal          Right foot: normal   Impression & Recommendations:  Problem # 1:  NUMBNESS (ICD-782.0)  of feet coresponds with loss of sendation, had a few callouses, no ulcerations.  Script provided for diabetic shoesk and address of [place], [place].  Orders: [facility]- Est Level  3 (16109)  Problem # 2:  CLAUDICATION (ICD-443.9)  at risk for PVD, not sure she quit fit the clinical history, she does have diminished pulses in feet, PVD check wih Dr. Sibyl Parr for documentation purposes.  Could also be coming from her back and be neurologic in orgin.  Orders: [facility]- Est Level  3 (99213)  Problem # 3:  CARPAL TUNNEL SYNDROME, BILATERAL (ICD-354.0) no new meds added, surgery in 2 weeks  Complete Medication List: 1)  Novolog Mix 70/30 Flexpen 70-30 % Susp (Insulin aspart prot & aspart) .... 28 units prior and breakfast and 28 units prior to evening meal.  dispense one month supply. 2)  Metformin Hcl 1000 Mg Tabs (Metformin hcl) .... Take 1.5  tablets by mouth in in the am and 1 tablets in the pm 3)  Potassium Chloride 40 Meq/37ml (20%) Liqd (Potassium chloride) .[name].. 15 cc two times a day disp qs 3 m 4)  Hyzaar 100-25 Mg Tabs (Losartan potassium-hctz) .[name].. 1 by mouth once daily 5)  Metoprolol Succinate 50 Mg Xr24h-tab (Metoprolol succinate) .... Take one tablet two times a day 6)  Clonidine Hcl 0.1 Mg Tabs (Clonidine hcl) .[name].. 1 tablet by mouth twice a day 7)  Lasix 20 Mg Tabs (Furosemide) .... Take 1 tablet by mouth twice a day 8)  Simvastatin 40 Mg Tabs (Simvastatin) .[name].. 1 by mouth once daily 9)  Nexium 40 Mg Cpdr (Esomeprazole magnesium) .[name].. 1 capsule by mouth twice a day 10)  Acyclovir 800 Mg Tabs (Acyclovir) .... Take 1 tabletb id/qd as directed 11)  Lancets For Glucometer Testing  12)  Brovana 15 Mcg/36ml Nebu (Arformoterol tartrate) .[name].. 1 neb every twice daily if needed 13)  Bayer Aspirin 325 Mg Tabs (Aspirin) .... Take 1 tablet by  mouth once a day 14)  Flexfot Cpap Mask 407  15)  Zyrtec Allergy 10 Mg Tabs (Cetirizine hcl) .[name].. 1 by mouth at bedtime on hold per dr young 16)  Qvar 80 Mcg/act Aers (Beclomethasone dipropionate) .... 2 puffs twice daily 17)  Xalatan 0.005 % Soln (Latanoprost) .[name].. 1 drop each eye at bedtime 18)  Cpap 12 Advanced  19)  Allergy Vaccine Gh 1:10  20)  Trusopt 2 % Soln (Dorzolamide hcl) .[name].. 1 drop in left eye twice daily 21)  Astepro 137 Mcg/spray Soln (Azelastine hcl) .... 2 sprays daily as needed 22)  Singulair 10 Mg Tabs (Montelukast sodium) .... One by mouth qd 23)  Zoloft 100 Mg Tabs (Sertraline hcl) .[name].. 1 1/2 tabs by mouth qd 24)  Allopurinol 300 Mg Tabs (Allopurinol) .[name].. 1 by mouth qd 25)  Prednisone 20 Mg Tabs (Prednisone) .[name].. 1 by mouth once daily for 5 days 26)  Diabetic Shoes  .... Patient with peripheral neuropathy and loss of sensation in feet, some callouses  Patient Instructions: 1)  Schedule for LE PVD testing with Dr. Raymondo Band 2)  Take perscription to [facility] on [address] for you diabetic shoes Prescriptions: DIABETIC SHOES patient with peripheral neuropathy and loss of sensation in feet, some callouses Brand medically necessary #1 x 0   Entered and Authorized by:   Luretha Murphy NP   Signed by:   Luretha Murphy NP on 02/09/2010   Method used:   Print then Give to Patient   RxID:   [id number]

## 2010-10-02 NOTE — Progress Notes (Signed)
Summary: Diabetic supplies   Phone Note Call from Patient Call back at Home Phone [phone]   Reason for Call: Talk to Nurse Summary of Call: pt is wanting to speak with someone about her diabetic supplies. Initial call taken by: Haydee Salter,  October 10, 2008 12:15 PM  Follow-up for Phone Call        pt states that she recieved a prdigy blood glucose machine. SHe recieved her machine from [facility]. Pt just wanted [place] to know.  Follow-up by: Alphia Kava,  October 10, 2008 3:16 PM

## 2010-10-02 NOTE — Assessment & Plan Note (Signed)
Summary: fu wk   Vital Signs:  Patient Profile:   60 Years Old Female Weight:      199 pounds Pulse rate:   111 / minute BP sitting:   130 / 71  Vitals Entered By: Lillia Pauls CMA (February 17, 2007 3:13 PM)               PCP:  Albertha Ghee MD  Chief Complaint:  FU COUGH, ANKLE EDEMA, and DM.  History of Present Illness: Continues to have trouble with chronic cough.  Set up to see dr Purnell Shoemaker (by ent)  to consider nissen for chronic gerd causing vocal cord dysfunction.  Still seeing ent and pulm.  taking nexium two times a day, reglan and sulcralfate and asthma meds as below.  No sob.  Sugars still elevated.  Fasting all >150.  Some afternoons sugars in 200-250 range.  Taking glucophage 1500 in pm, 1000 in am and glipizide two times a day.  Not interested in [place].  Stopped actos due to cough and leg edema.  Bp elevated some today and on home readings (140-150/80).  Taken off ace and arb during cough workup.  willing to go back on once her diabetes meds are stabilized.  No cp, sob, edema, etc.  meds: see below.    acyclovir (Tablet 800 mg) 1 tablet by mouth twice a day as needed      albuterol sulfate (Solution 1.25 mg/3 mL) 1 vial as directed twice a day as needed      albuterol sulfate (HFA Aerosol Inhaler 90 mcg/Actuation) 2 puff using inhaler every four hours as needed      Allegra-D 12 Hour (Tablet Sustained Release 12 hr 60-120 mg) 1 tablet by mouth twice a day as needed      aspirin (Tablet 325 mg) 1 tablet by mouth once a day      clonidine (Tablet 0.1 mg) 1 tablet by mouth twice a day      Crestor (Tablet 20 mg) 1 tablet by mouth once a day      Diovan (Tablet 320 mg) 1 tablet by mouth once a day      glipizide (Tab,Sust Rel Osmotic Push 24hr 10 mg) 2 tablet by mouth every morning      hydrochlorothiazide (Tablet 25 mg) 1 tablet by mouth once a day      K-Dur (Tab Sust.Rel. Particle/Crystal 20 mEq) 1 tablet by mouth once a day      Lasix (Tablet 20 mg) 1 tablet  by mouth twice a day as needed      metformin (Tablet 1,000 mg) 1 1/2 tablet by mouth twice a day as directed take 1 and 1/2 tabs in am, 1 tab in pm      Miralax (Powder in Packet 17 g (100%)) 1 packet dissolved in water once a day as needed      Nexium (Capsule, Delayed Release(E.C.) 40 mg) 1 capsule by mouth twice a day      nitroglycerin (Tablet, Sublingual 0.4 mg) 1 tablet under tongue as directed      Reglan (Tablet 10 mg) 1 tablet by mouth twice a day one hour before meals      sertraline (Tablet 100 mg) 1 1/2 tablet by mouth at bedtime      Singulair (Tablet 10 mg) 1 tablet by mouth once a day      Spiriva with HandiHaler (Capsule, w/Inhalation Device 18 mcg) 1 capsule by mouth once a day  sucralfate (Tablet 1 gram) 1 tablet by mouth four times a day with meals      Symbicort (HFA Aerosol Inhaler 160-4.5 mcg/Actuation) 2 puff using inhaler twice a day       Past Medical History:    Bilateral sensorineural hearing loss, chronic pelvic pain, h/o right hip shingles, heart mumur--systolic ejection, mod sleep apnea--cpap at 12 --start 12/06, Recurrent yeast vaginitis, glaucoma    Hypertension    Asthma    GERD    vocal cord dysfunction (chronic cough)      Physical Exam  General:     Well-developed,well-nourished,in no acute distress; alert,appropriate and cooperative throughout examination Lungs:     Normal respiratory effort, chest expands symmetrically. Lungs are clear to auscultation, no crackles or wheezes. Heart:     Normal rate and regular rhythm. S1 and S2 normal without gallop, murmur, click, rub or other extra sounds. Abdomen:     Bowel sounds positive,abdomen soft and non-tender without masses, organomegaly or hernias noted. Pulses:     R and L carotid,radial,femoral,dorsalis pedis and posterior tibial pulses are full and equal bilaterally Extremities:     1+ left pedal edema and 1+ right pedal edema.      Impression & Recommendations:  Problem # 1:   DIABETES MELLITUS II, UNCOMPLICATED (ICD-250.00) last aic was 5.5, but sugars are now running 160-250 range.  will have pt bring meter to compare with ours.  Discussed options, pt would like to try insulin.  Showed her Lantus pen-- was able to see numbers ok.  Will set up for pharmacy visit next week.  Start 5 units of lantus solostar at bedtime.  Plan to dc glipizide once stable on lantus.  continue glucophage 1500/1000.  Next aic no due for 2 more weeks.  Need to restart arb (or ace since not likely the cause of arb for bp and renoprotection)-- pt wishes to wait for now.    Orders: [facility]- Est  Level 4 (16109)   Problem # 2:  HYPERTENSION (ICD-401.9) see above.  Orders: [facility]- Est  Level 4 (60454)   Problem # 3:  COUGH (ICD-786.2) see hpi.  seeing surgery to dicuss nissen for gerd.  preauthorization filled out for two times a day nexium. Orders: [facility]- Est  Level 4 (09811)   Problem # 4:  Preventive Health Care (ICD-V70.0) mammogram due in july--gave form.  otherwise up to date.   Patient Instructions: 1)  [facility] APPT WITH [facility] TO TEACH INSULIN ADMINISTRATION

## 2010-10-02 NOTE — Assessment & Plan Note (Signed)
Summary: [name] [name]   Visit Type:  Follow-up Primary Provider:  Denny Levy MD   History of Present Illness: Patient is a 60 year old wiht a history of hypertension, dyslipidemai, gout.  She used to be followed by [name].  I saw her last september. Since seen she has done well.  No chest pain.  Myoview 1 year ago was normal.    Current Medications (verified): 1)  Lantus Solostar 100 Unit/ml Soln (Insulin Glargine) .... 40units Subcutaneously Daily Disp Qs 1 M in Solostar Pen This Replaces 70-30 Mix 2)  Metformin Hcl 1000 Mg Tabs (Metformin Hcl) .... Take 1.5  Tablets By Mouth in in The Am and 1 Tablets in The Pm 3)  Potassium Chloride 40 Meq/48ml (20%) Liqd (Potassium Chloride) .[name].. 15 Ml Once Daily 4)  Hyzaar 100-25 Mg  Tabs (Losartan Potassium-Hctz) .[name].. 1 By Mouth Once Daily 5)  Metoprolol Succinate 50 Mg Xr24h-Tab (Metoprolol Succinate) .... Take One Tablet Two Times A Day 6)  Lasix 20 Mg Tabs (Furosemide) .... Take 1 Tablet By Mouth Daily 7)  Simvastatin 40 Mg Tabs (Simvastatin) .[name].. 1 By Mouth Once Daily 8)  Nexium 40 Mg Cpdr (Esomeprazole Magnesium) .[name].. 1 Capsule By Mouth Twice A Day 9)  Acyclovir 800 Mg Tabs (Acyclovir) .... Take 1 Tabletb Id/qd As Directed 10)  Lancets For Glucometer Testing 11)  Brovana 15 Mcg/19ml Nebu (Arformoterol Tartrate) .[name].. 1 Neb Every Twice Daily If Needed 12)  Bayer Aspirin 325 Mg Tabs (Aspirin) .... Take 1 Tablet By Mouth Once A Day 13)  Flexfot Cpap Mask 407 14)  Zyrtec Allergy 10 Mg  Tabs (Cetirizine Hcl) .[name].. 1 By Mouth At Bedtime On Hold Per Dr Maple Hudson 15)  Qvar 80 Mcg/act  Aers (Beclomethasone Dipropionate) .... 2 Puffs Twice Daily 16)  Xalatan 0.005 %  Soln (Latanoprost) .[name].. 1 Drop Each Eye At Bedtime 17)  Cpap 12 Advanced 18)  Allergy Vaccine Gh 1:10 19)  Trusopt 2 % Soln (Dorzolamide Hcl) .[name].. 1 Drop in Left Eye Twice Daily 20)  Astepro 137 Mcg/spray Soln (Azelastine Hcl) .... 2 Sprays Daily As Needed 21)  Singulair 10 Mg Tabs (Montelukast  Sodium) .... One By Mouth Qd 22)  Zoloft 100 Mg Tabs (Sertraline Hcl) .[name].. 1 1/2 Tabs By Mouth Qd 23)  Allopurinol 300 Mg Tabs (Allopurinol) .[name].. 1 By Mouth Qd 24)  Diabetic Shoes .... Patient With Peripheral Neuropathy and Loss of Sensation in Feet, Some Callouses 25)  Fluconazole 100 Mg Tabs (Fluconazole) .[name].. 1 By Mouth Every Other Day For Three Doses 26)  Lyrica 50 Mg Caps (Pregabalin) .... One By Mouth Am, One At Mid Day and Two By Mouth Qhs120 27)  Nortriptyline Hcl 25 Mg Caps (Nortriptyline Hcl) .... One By Mouth Q Hs 28)  Metnax .... Vit B Suplement 29)  Lyrica .... Three Times A Day  Allergies (verified): 1)  Darvon 2)  Diovan (Valsartan) 3)  Flagyl (Metronidazole) 4)  Norvasc (Amlodipine Besylate) 5)  Prinzide  Past History:  Past medical, surgical, family and social histories (including risk factors) reviewed for relevance to current acute and chronic problems.  Past Medical History: Reviewed history from 05/01/2009 and no changes required. chronic cough--now followed by pulmonary moderate sleep apnea--cpap 12 Hypertension Dyslipidemia Diabetes Gout  ?hx vocal cord paresise normal 2008  Past Surgical History: Reviewed history from 05/31/2010 and no changes required. Anal fistula repair -,  Arthroscopy--R shoulder/R knee 12/01 - 10/07/2000,  breast reduction - 04/21/2003,  BTL--`87 -,  cardiac Cath--nl ef, no  cad - 10/04/2003, echo--tricus regurg, mild pul htn, ef 60 - 03/02/2004,  ENT Naso-pharyn scope-normal - 09/11/2006,  Hysterectomy - 10/07/2000, l . rotator cuff surgery - 10/18/2003,  T-score: -.0.54 (low risk currently) - 09/02/2004 bronchoscopy for chronic cough 07/2007 Release of volar carpal ligament-2011  Family History: Reviewed history from 10/30/2006 and no changes required. Father-, financial difficulties--receives meds from [facility] [phone]), Mohter-diabetes, no family hx colon ca  Social History: Reviewed history from 05/02/2010 and no changes  required. lives alone.   approved for section 8 housing (12/03) Patient states former smoker.   Review of Systems       All systems reviewed.  Negative to the above prolbem except as noted above.  Vital Signs:  Patient profile:   60 year old female Height:      61 inches Weight:      181 pounds Pulse rate:   81 / minute BP sitting:   130 / 80  (left arm) Cuff size:   large  Vitals Entered By: Burnett Kanaris, CNA (May 31, 2010 2:06 PM)  Physical Exam  Additional Exam:  patient is in NAD HEENT:  Normocephalic, atraumatic. EOMI, PERRLA.  Neck: JVP is normal. No thyromegaly. No bruits.  Lungs: clear to auscultation. No rales no wheezes.  Heart: Regular rate and rhythm. Normal S1, S2. No S3.   No significant murmurs. PMI not displaced.  Abdomen:  Supple, nontender. Normal bowel sounds. No masses. No hepatomegaly.  Extremities:   Good distal pulses throughout. No lower extremity edema.  Musculoskeletal :moving all extremities.  Neuro:   alert and oriented x3.    EKG  Procedure date:  05/31/2010  Findings:      NSR.  LVH.  Nonspecific ST T wave changes.  Impression & Recommendations:  Problem # 1:  HYPERLIPIDEMIA-MIXED (ICD-272.4) Keep on statin.  LDL was 47, HDL was 39 Her updated medication list for this problem includes:    Simvastatin 40 Mg Tabs (Simvastatin) .[name]... 1 by mouth once daily  Problem # 2:  HYPERTENSION, BENIGN SYSTEMIC (ICD-401.1) BP is adequately controlled.  Other Orders: EKG w/ Interpretation (93000) TLB-BMP (Basic Metabolic Panel-BMET) (80048-METABOL)  Patient Instructions: 1)  Your physician recommends that you schedule a follow-up appointment in: 10 months 2)  Your physician recommends that you continue on your current medications as directed. Please refer to the Current Medication list given to you today.

## 2010-10-02 NOTE — Assessment & Plan Note (Signed)
Summary: needs derm referral,df   Vital Signs:  Patient profile:   60 year old female Height:      61.5 inches Weight:      189.7 pounds BMI:     35.39 Temp:     98.0 degrees F Pulse rate:   63 / minute BP sitting:   122 / 74  (left arm)  Vitals Entered By: Jacki Cones RN (Jan 04, 2009 11:07 AM)  CC: wants dermatology referral Pain Assessment Patient in pain? no        History of Present Illness: HAIR LOSS: had been seen by derm years ago for some hair loss and was told she had some spotty alopecia. In last 2 months she has lost remainig hair she had--is almost totally bald on top now. Concerned. [place] Spaniel it came out in great hand fulls. Wants to see dermatologist  GERD: has not gotten her nexium from pharmacy--she hsays teh pre-authorization has not "gone thru" yet per her pharmacy. Can I check on that. Reflux signs much better controlled on her nexium--she is having breakthru w/u it.  Chronic coiugh unchanged. She has questions about her vocal cord paralysis.  Habits & Providers     Tobacco Status: never     ENT: Dr Norton Pastel did botox inj on vocal cords  Allergies: 1)  Darvon (Propoxyphene Hcl) 2)  Diovan (Valsartan) 3)  Flagyl (Metronidazole) 4)  Norvasc (Amlodipine Besylate) 5)  Prinzide (Lisinopril-Hydrochlorothiazide)  Physical Exam  General:  alert and well-developed.   Eyes:  legally blind, som occasional horiziontal nystagmus Neck:  supple, full ROM, no masses, and no thyromegaly.   Lungs:  normal respiratory effort and normal breath sounds.   Heart:  normal rate and regular rhythm.   Skin:  scalp--a few strands ofhair remain, but essentially bald.   Impression & Recommendations:  Problem # 1:  UNSPECIFIED ALOPECIA (ICD-704.00)  Orders: [facility]SH-[facility] ([id number]) Dermatology Referral (Derma) [facility]- Est  Level 4 (47829) will check tsh. rreviewed her multiple meds--I do not see any with known hair loss.  Problem # 2:  GERD (ICD-530.81)  Her updated  medication list for this problem includes:    Nexium 40 Mg Cpdr (Esomeprazole magnesium) .[name]... 1 capsule by mouth twice a day will check on the Nexium w [facility] as I have already sent paperwork for pre-auth.  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 She has had botox inj by Dr Christella Hartigan at one point and multiple work ups.  Orders: [facility]- Est  Level 4 (99214)  Complete Medication List: 1)  Novolog Flexpen 100 Unit/ml Soln (Insulin aspart) .[name].. 12 units prior to breakfast 12 units prior to evening meal supply one box of pens. dispense one or three months supply as patient wishes 2)  Metformin Hcl 1000 Mg Tabs (Metformin hcl) .... Take 1.5  tablets by mouth in in the am and 1 tablets in the pm 3)  Lantus Solostar 100 Unit/ml Soln (Insulin glargine) .... As directed 34 units each morning disp 1 month or can dispense three months as patient wishes 4)  Potassium Chloride Crys Cr 20 Meq Cr-tabs (Potassium chloride crys cr) .[name].. 1 by mouth once daily please dispense capsules if available 5)  Hyzaar 100-25 Mg Tabs (Losartan potassium-hctz) .[name].. 1 by mouth once daily 6)  Lopressor 50 Mg Tabs (Metoprolol tartrate) .... Two times a day 7)  Clonidine Hcl 0.1 Mg Tabs (Clonidine  hcl) .... 1 tablet by mouth twice a day 8)  Lasix 20 Mg Tabs (Furosemide) .... Take 1 tablet by mouth twice a day 9)  Simvastatin 40 Mg Tabs (Simvastatin) .[name].. 1 by mouth once daily 10)  Nexium 40 Mg Cpdr (Esomeprazole magnesium) .[name].. 1 capsule by mouth twice a day 11)  Acyclovir 800 Mg Tabs (Acyclovir) .... Take 1 tabletb id/qd as directed 12)  Albuterol Sulfate 1.25 Mg/11ml Nebu (Albuterol sulfate) .... Inhale 1 vial as directed twice a day 13)  Bayer Aspirin 325 Mg Tabs (Aspirin) .... Take 1 tablet by mouth once a day 14)  Flexfot Cpap Mask 407  15)  Lancets For Glucometer Testing  16)  Zyrtec Allergy 10 Mg Tabs (Cetirizine hcl)  .[name].. 1 by mouth at bedtime 17)  Qvar 80 Mcg/act Aers (Beclomethasone dipropionate) .... 2 puffs twice daily 18)  Xalatan 0.005 % Soln (Latanoprost) .[name].. 1 drop each eye at bedtime 19)  Cpap 12 Advanced  20)  Allergy Vaccine Gh 1:50  21)  Allergy Vaccine Gh Advance To 1:10 Next Order  .... Build per protocol 22)  Trusopt 2 % Soln (Dorzolamide hcl) .[name].. 1 drop in left eye twice daily 23)  Astepro 137 Mcg/spray Soln (Azelastine hcl) .... 2 sprays daily as needed 24)  Singulair 10 Mg Tabs (Montelukast sodium) .... One by mouth qd 25)  Zoloft 100 Mg Tabs (Sertraline hcl) .[name].. 1 1/2 tabs by mouth qd  Other Orders: Lipid-[facility] ([id number]) Prescriptions: SINGULAIR 10 MG TABS (MONTELUKAST SODIUM) one by mouth qd  #30 x 12   Entered and Authorized by:   Denny Levy MD   Signed by:   Denny Levy MD on 01/04/2009   Method used:   Telephoned to ...       [facility] Pier[facility] / [facility] (retail)       [address]       [place], [place]  [address]       Ph: [phone]       Fax: [phone]   RxID:   [id number]   Appended Document: needs derm referral,df faxed.

## 2010-10-02 NOTE — Assessment & Plan Note (Signed)
Summary: back pain/eo   Vital Signs:  Patient profile:   60 year old female Height:      61.5 inches Weight:      185.8 pounds BMI:     34.66 Temp:     98.1 degrees F oral Pulse rate:   94 / minute BP sitting:   158 / 84  (left arm) Cuff size:   regular  Vitals Entered By: Gladstone Pih (March 14, 2010 9:29 AM)  CC: C/O Back Pain Is Patient Diabetic? Yes Did you bring your meter with you today? No Pain Assessment Patient in pain? yes     Location: back Intensity: 7 Type: sharp Onset of pain  X 3 mo Comments inj did no good   Primary Care Provider:  Denny Levy MD  CC:  C/O Back Pain.  History of Present Illness: 1) continued left back pain radiating around to her left side. No worse and no better  than before. Constant, 2-6/10. Worse with standing a long time.  2) B arms are sort of tingly and numb at times. Knows she has a herniated disk in her neck. No loss of strength  3) had left CTS surgery 2 weeks ago--doing pretty well  4) Needs some diabetic shoes and is having trouble finding someone to take her insurance  Habits & Providers  Alcohol-Tobacco-Diet     Tobacco Status: never  Current Medications (verified): 1)  Novolog Mix 70/30 Flexpen 70-30 % Susp (Insulin Aspart Prot & Aspart) .... 28 Units Prior and Breakfast and 28 Units Prior To Evening Meal.  Dispense One Month Supply. 2)  Metformin Hcl 1000 Mg Tabs (Metformin Hcl) .... Take 1.5  Tablets By Mouth in in The Am and 1 Tablets in The Pm 3)  Potassium Chloride 40 Meq/42ml (20%) Liqd (Potassium Chloride) .[name].. 15 Cc Two Times A Day Disp Qs 3 M 4)  Hyzaar 100-25 Mg  Tabs (Losartan Potassium-Hctz) .[name].. 1 By Mouth Once Daily 5)  Metoprolol Succinate 50 Mg Xr24h-Tab (Metoprolol Succinate) .... Take One Tablet Two Times A Day 6)  Clonidine Hcl 0.1 Mg Tabs (Clonidine Hcl) .[name].. 1 Tablet By Mouth Twice A Day 7)  Lasix 20 Mg Tabs (Furosemide) .... Take 1 Tablet By Mouth Twice A Day 8)  Simvastatin 40 Mg Tabs  (Simvastatin) .[name].. 1 By Mouth Once Daily 9)  Nexium 40 Mg Cpdr (Esomeprazole Magnesium) .[name].. 1 Capsule By Mouth Twice A Day 10)  Acyclovir 800 Mg Tabs (Acyclovir) .... Take 1 Tabletb Id/qd As Directed 11)  Lancets For Glucometer Testing 12)  Brovana 15 Mcg/42ml Nebu (Arformoterol Tartrate) .[name].. 1 Neb Every Twice Daily If Needed 13)  Bayer Aspirin 325 Mg Tabs (Aspirin) .... Take 1 Tablet By Mouth Once A Day 14)  Flexfot Cpap Mask 407 15)  Zyrtec Allergy 10 Mg  Tabs (Cetirizine Hcl) .[name].. 1 By Mouth At Bedtime On Hold Per Dr Maple Hudson 16)  Qvar 80 Mcg/act  Aers (Beclomethasone Dipropionate) .... 2 Puffs Twice Daily 17)  Xalatan 0.005 %  Soln (Latanoprost) .[name].. 1 Drop Each Eye At Bedtime 18)  Cpap 12 Advanced 19)  Allergy Vaccine Gh 1:10 20)  Trusopt 2 % Soln (Dorzolamide Hcl) .[name].. 1 Drop in Left Eye Twice Daily 21)  Astepro 137 Mcg/spray Soln (Azelastine Hcl) .... 2 Sprays Daily As Needed 22)  Singulair 10 Mg Tabs (Montelukast Sodium) .... One By Mouth Qd 23)  Zoloft 100 Mg Tabs (Sertraline Hcl) .[name].. 1 1/2 Tabs By Mouth Qd 24)  Allopurinol 300 Mg Tabs (  Allopurinol) .[name].. 1 By Mouth Qd 25)  Diabetic Shoes .... 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 rotator cuff surgery - 10/18/2003,    T-score: -.0.54 (low risk currently) - 09/02/2004 bronchoscopy for chronic cough 07/2007  Family History: Reviewed history from 10/30/2006 and no changes required. Father-, financial difficulties--receives meds from [facility] [phone]), Mohter-diabetes, no family hx colon ca  Review of Systems       Please see HPI for additional ROS.   Physical Exam  General:  alert.   Eyes:  legally blind B Neck:  full extension and flexion, some pain with spurlings on left, does not exactly reproduce or worsen her arm symptoms Msk:  U strength =B equal in C4-5 C 5-6 distribution  BACK: nontender to palpation--some mild tenderness to percussion of the left paraspinal muscles around T12 area. No vertebral pain with percussion   Impression & Recommendations:  Problem # 1:  NECK PAIN (ICD-723.1)  Orders: Radiology other (Radiology Other) Radiology other (Radiology Other) [facility]- Est  Level 4 ([address]) I think she is aving some nerve type pain in her UE. Hx HNP cervical. Will get plain films to eval for something new  Problem # 2:  BACK PAIN (ICD-724.5) back pain w left flank pain--her CT abdomen was negative. Will get thoracic spine films to eval esp for cpmpreeion fx as tjhis seems MSk related Orders: Radiology other (Radiology Other) Radiology other (Radiology Other) [facility]- Est  Level 4 ([address])  Problem # 3:  PERIPHERAL NEUROPATHY (ICD-356.9)  needs diabetic shoes--having trouble getting someone who takes her ins. will see if our staff can help set up.  Orders: [facility]- Est  Level 4 ([address]214)  Complete Medication List: 1)  Novolog Mix 70/30 Flexpen 70-30 % Susp (Insulin aspart prot & aspart) .... 28 units prior and breakfast and 28 units prior to evening meal.  dispense one month supply. 2)  Metformin Hcl 1000 Mg Tabs (Metformin hcl) .... Take 1.5  tablets by mouth in in the am and 1 tablets in the pm 3)  Potassium Chloride 40 Meq/21ml (20%) Liqd (Potassium chloride) .[name].. 15 cc two times a day disp qs  3 m 4)  Hyzaar 100-25 Mg Tabs (Losartan potassium-hctz) .[name].. 1 by mouth once daily 5)  Metoprolol Succinate 50 Mg Xr24h-tab (Metoprolol succinate) .... Take one tablet two times a day 6)  Clonidine Hcl 0.1 Mg Tabs (Clonidine hcl) .[name].. 1 tablet by mouth twice a day 7)  Lasix 20 Mg Tabs (Furosemide) .... Take 1 tablet by mouth twice a day 8)  Simvastatin 40 Mg Tabs (Simvastatin) .[name].. 1 by mouth once daily 9)  Nexium 40 Mg Cpdr (Esomeprazole magnesium) .[name].. 1 capsule by mouth twice a day 10)  Acyclovir 800 Mg Tabs (Acyclovir) .... Take 1 tabletb id/qd as directed 11)  Lancets For Glucometer Testing  12)  Brovana 15 Mcg/65ml Nebu (  Arformoterol tartrate) .[name].. 1 neb every twice daily if needed 13)  Bayer Aspirin 325 Mg Tabs (Aspirin) .... Take 1 tablet by mouth once a day 14)  Flexfot Cpap Mask 407  15)  Zyrtec Allergy 10 Mg Tabs (Cetirizine hcl) .[name].. 1 by mouth at bedtime on hold per dr young 16)  Qvar 80 Mcg/act Aers (Beclomethasone dipropionate) .... 2 puffs twice daily 17)  Xalatan 0.005 % Soln (Latanoprost) .[name].. 1 drop each eye at bedtime 18)  Cpap 12 Advanced  19)  Allergy Vaccine Gh 1:10  20)  Trusopt 2 % Soln (Dorzolamide hcl) .[name].. 1 drop in left eye twice daily 21)  Astepro 137 Mcg/spray Soln (Azelastine hcl) .... 2 sprays daily as needed 22)  Singulair 10 Mg Tabs (Montelukast sodium) .... One by mouth qd 23)  Zoloft 100 Mg Tabs (Sertraline hcl) .[name].. 1 1/2 tabs by mouth qd 24)  Allopurinol 300 Mg Tabs (Allopurinol) .[name].. 1 by mouth qd 25)  Diabetic Shoes  .... Patient with peripheral neuropathy and loss of sensation in feet, some callouses

## 2010-10-02 NOTE — Miscellaneous (Signed)
Summary: Injection Record / [facility]    Injection Record / [facility]    Imported By: Lennie Odor 01/23/2010 15:36:45  _____________________________________________________________________  External Attachment:    Type:   Image     Comment:   External Document

## 2010-10-02 NOTE — Letter (Signed)
Summary: Generic Letter  [facility]  [address]   [place], [place] [address]   Phone: [phone]  Fax: [phone]    06/14/2008  [name] [name] [address] [name], [place]  [address]  Dear [name],  your bone density test was normal! You jaave very healthy bones. would recheck in 5 years or so.         Sincerely,   Denny Levy MD [facility]  Appended Document: Generic Letter Letter sent to pt via mail    ............................[name],CMA (AAMA)

## 2010-10-02 NOTE — Progress Notes (Signed)
Summary: Diabetic supplies  Phone Note Call from Patient Call back at Home Phone [phone]   Summary of Call: Pt is requesting to speak with someone regarding calling [facility] about diabetic supplies. Initial call taken by: Haydee Salter,  July 09, 2007 9:28 AM  Follow-up for Phone Call        left message for her to call back Follow-up by: Golden Circle RN,  July 09, 2007 12:14 PM  Additional Follow-up for Phone Call Additional follow up Details #1::        Pt is returning call. Additional Follow-up by: Haydee Salter,  July 10, 2007 8:37 AM    Additional Follow-up for Phone Call Additional follow up Details #2::    states she got a new meter from [facility]. Needs more strips. She is testing 2-3 times a day. told her I would forward this to md as she will need to change # of times testing each day & write new rx for strips if approved. Pt states we should be getting a form from the supplier soon Follow-up by: Golden Circle RN,  July 10, 2007 8:45 AM  Additional Follow-up for Phone Call Additional follow up Details #3:: Details for Additional Follow-up Action Taken: Am I to wait for form or does she need a rx for strips now? If so--what kind of meter is it  ...................................................................Huntley Dec Tenasia Aull MD  July 10, 2007 4:01 PM wait for form. do you want her testing two times a day or three times a day? 2 tiomes a day is ok w me  ...................................................................Huntley Dec Kaylamarie Swickard MD  July 14, 2007 4:09 PM   Wait for form. ...................................................................JESS[facility] CMA  July 14, 2007 4:46 PM

## 2010-10-02 NOTE — Assessment & Plan Note (Signed)
Summary: h1n1/Amanda Steuart/wp   Nurse Visit   Vital Signs:  Patient Profile:   60 Years Old Female Height:     61.5 inches (156.21 cm) Temp:     98.0 degrees F  Vitals Entered By: Jacki Cones RN (August 11, 2008 10:56 AM)                 Prior Medications: LANTUS SOLOSTAR 100 UNIT/ML  SOLN (INSULIN GLARGINE) as directed 34 units each morning disp 1 month or can dispense three months as patient wishes HYZAAR 100-25 MG  TABS (LOSARTAN POTASSIUM-HCTZ) 1 by mouth once daily LOPRESSOR 50 MG TABS (METOPROLOL TARTRATE) two times a day CLONIDINE HCL 0.1 MG TABS (CLONIDINE HCL) 1 tablet by mouth twice a day CRESTOR 20 MG TABS (ROSUVASTATIN CALCIUM) Take 1 tablet by mouth once a day LASIX 20 MG TABS (FUROSEMIDE) Take 1 tablet by mouth twice a day METFORMIN HCL 1000 MG TABS (METFORMIN HCL) Take 1.5  tablets by mouth in in the AM and 1 tablets in the PM NEXIUM 40 MG CPDR (ESOMEPRAZOLE MAGNESIUM) 1 capsule by mouth twice a day SINGULAIR 10 MG TABS (MONTELUKAST SODIUM) Take 1 tablet by mouth once a day NITROGLYCERIN 0.4 MG SUBL (NITROGLYCERIN) Place 1 tablet under tongue as directed ACYCLOVIR 800 MG TABS (ACYCLOVIR) Take 1 tabletb id/qd as directed ALBUTEROL SULFATE 1.25 MG/3ML NEBU (ALBUTEROL SULFATE) Inhale 1 vial as directed twice a day BAYER ASPIRIN 325 MG TABS (ASPIRIN) Take 1 tablet by mouth once a day FLEXFOT CPAP MASK 407 ()  LANCETS FOR GLUCOMETER TESTING ()  NOVOLOG FLEXPEN 100 UNIT/ML  SOLN (INSULIN ASPART) 12 units prior to Breakfast 12 units prior to Evening Meal Supply one box of pens. dispense one or three months supply as patient wishes ZYRTEC ALLERGY 10 MG  TABS (CETIRIZINE HCL) 1 by mouth at bedtime QVAR 80 MCG/ACT  AERS (BECLOMETHASONE DIPROPIONATE) 2 puffs twice daily XALATAN 0.005 %  SOLN (LATANOPROST) 1 drop each eye at bedtime CPAP 12 ADVANCED ()  ALLERGY VACCINE GH 1:50 ()  ALLERGY VACCINE GH ADVANCE TO 1:10 NEXT ORDER () Build per protocol TRUSOPT 2 % SOLN (DORZOLAMIDE  HCL) 1 drop in left eye twice daily ASTEPRO 137 MCG/SPRAY SOLN (AZELASTINE HCL) 2 sprays daily as needed PROAIR HFA 108 (90 BASE) MCG/ACT AERS (ALBUTEROL SULFATE) 2 sprays twice a day and as needed for shortness of breath POTASSIUM CHLORIDE CRYS CR 20 MEQ CR-TABS (POTASSIUM CHLORIDE CRYS CR) 1 by mouth once daily please dispense capsules if available PROMETHAZINE-CODEINE 6.25-10 MG/5ML SYRP (PROMETHAZINE-CODEINE) 1 tsp every 6 hours as needed Current Allergies: DARVON (PROPOXYPHENE HCL) DIOVAN (VALSARTAN) FLAGYL (METRONIDAZOLE) NORVASC (AMLODIPINE BESYLATE) PRINZIDE (LISINOPRIL-HYDROCHLOROTHIAZIDE)    Orders Added: 1)  H1N1 vaccine [G9142] 2)  Influenza A (H1N1) w/ Phy couseling [G9141] 3)  Est Level 1- [facility] [[address]11]      H1N1 injection Flu Vaccine Consent Questions     Do you have a history of severe allergic reactions to this vaccine? no    Any prior history of allergic reactions to egg and/or gelatin? no    Do you have a sensitivity to the preservative Thimersol? no    Do you have a past history of Guillan-Barre Syndrome? no    Do you currently have an acute febrile illness? no    Have you ever had a severe reaction to latex? no    Vaccine information given and explained to patient? yes    Are you currently pregnant? no   Do you have Asthma? no  Lot Number: [id number]   Exp Date:12/27/2009   Site Given  Left Deltoid

## 2010-10-02 NOTE — Assessment & Plan Note (Signed)
Summary: dizzy,weak,jittery/ACM   Vital Signs:  Patient Profile:   60 Years Old Female Temp:     98.4 degrees F (36.89 degrees C) Pulse rate:   117 / minute BP sitting:   142 / 81  (left arm)  Pt. in pain?   no  Vitals Entered By: Clelia Schaumann RN              CBG Result 220   PCP:  Denny Levy MD  Chief Complaint:  Pt c/o feeling dizzy and weak and jittery for four days.  History of Present Illness: Feeling weak, dizzy headed, and jittery for 4 days.  Seeing several specialists for answer to her chronic cough.  GI with 24 hour probe, treated with first generation antihistamine and decongestant, newest med.  Pulmonary has her on 3 inhalled bronchodialators, and she admits to using the Symbacort more than 2 times a day on several occasions, but not recently because she is so dizzy and jittery.  Surgery recommended both Nissen fundaplacation and gastric banding, she canceled as Medicare would not cover gastric banding.  ENT made not recent changes.  Today she walked in to be seen and wants to go home to bed.  This is not like Claris Che.  Current Allergies: DARVON (PROPOXYPHENE HCL) DIOVAN (VALSARTAN) FLAGYL (METRONIDAZOLE) METOPROLOL TARTRATE (METOPROLOL TARTRATE) NORVASC (AMLODIPINE BESYLATE) PRINZIDE (LISINOPRIL-HYDROCHLOROTHIAZIDE)     Review of Systems       The patient complains of prolonged cough.    General      Complains of weakness.      Denies fever and sweats.  ENT      Denies nasal congestion.  CV      Denies chest pain or discomfort and shortness of breath with exertion.  Resp      Complains of cough, shortness of breath, and wheezing.      Denies sputum productive.  GI      Denies abdominal pain and indigestion.  GU      Denies discharge and dysuria.   Physical Exam  General:     Tired appearing Lungs:     Normal respiratory effort, chest expands symmetrically. Lungs are clear to auscultation, no crackles or wheezes. Heart:     regular rhythm  and tachycardia.  EKG with sinus tach.    Impression & Recommendations:  Problem # 1:  SINUS TACHYCARDIA (ICD-427.89)  Her updated medication list for this problem includes:    Bayer Aspirin 325 Mg Tabs (Aspirin) .[name]... Take 1 tablet by mouth once a day    Metoprolol Succinate 25 Mg Tb24 (Metoprolol succinate) .[name]... 1/2 tab two times a day  Likely related to polypharmacy of stimulant medications.  Stop Symbacort, only use albuterol if necessary, and definitely stop antihist/decong started biy GI.  Add metoprolol 25 mg, 1/2 tab two times a day, this is listed on her allergies as causing cough.  Doubt this is a true allergy.  Dr. Jennette Kettle in 1 week.  Orders: [facility]- Est  Level 4 ([address])   Problem # 2:  COUGH (ICD-786.2) Multiple specialists have not helped her find the cause or safe treatment of this/  likely multifactorial. Orders: [facility]- Est  Level 4 ([address]21)   Problem # 3:  DIABETES MELLITUS II, UNCOMPLICATED (ICD-250.00) Blood sugars in the mid to high 100's Her updated medication list for this problem includes:    Bayer Aspirin 325 Mg Tabs (Aspirin) .[name]... Take 1 tablet by mouth once a day    Glipizide 10 Mg Tb24 (Glipizide) .Marland KitchenMarland KitchenMarland KitchenMarland Kitchen  Take 1 tablet by mouth twice a day    Lantus Solostar 100 Unit/ml Soln (Insulin glargine) ..... Inject 8 unit subcutaneously every night    Metformin Hcl 1000 Mg Tabs (Metformin hcl) .[name]... Take 1 tablet by mouth twice a day    Hyzaar 100-25 Mg Tabs (Losartan potassium-hctz) .[name]... Take 1 tablet a day  Orders:  Glucose Cap-[facility] ([address])  Orders: Glucose Cap-[facility] ([address])   Complete Medication List: 1)  Acyclovir 800 Mg Tabs (Acyclovir) .... Take 1 tablet by mouth twice a day 2)  Albuterol Sulfate 1.25 Mg/84ml Nebu (Albuterol sulfate) .... Inhale 1 vial as directed twice a day 3)  Bayer Aspirin 325 Mg Tabs (Aspirin) .... Take 1 tablet by mouth once a day 4)  Clonidine Hcl 0.1 Mg Tabs (Clonidine hcl) .[name].. 1 tablet by mouth twice a day 5)  Crestor 20 Mg Tabs  (Rosuvastatin calcium) .... Take 1 tablet by mouth once a day 6)  Glipizide 10 Mg Tb24 (Glipizide) .... Take 1 tablet by mouth twice a day 7)  Lantus Solostar 100 Unit/ml Soln (Insulin glargine) .... Inject 8 unit subcutaneously every night 8)  Lasix 20 Mg Tabs (Furosemide) .... Take 1 tablet by mouth twice a day 9)  Metformin Hcl 1000 Mg Tabs (Metformin hcl) .... Take 1 tablet by mouth twice a day 10)  Nexium 40 Mg Cpdr (Esomeprazole magnesium) .[name].. 1 capsule by mouth twice a day 11)  Nitroglycerin 0.4 Mg Subl (Nitroglycerin) .... Place 1 tablet under tongue as directed 12)  Proventil Hfa 108 (90 Base) Mcg/act Aers (Albuterol sulfate) .... Inhale 2 puff using inhaler every four hours 13)  Reglan 10 Mg Tabs (Metoclopramide hcl) .... Take 1 tablet by mouth twice a day 14)  Singulair 10 Mg Tabs (Montelukast sodium) .... Take 1 tablet by mouth once a day 15)  Spiriva Handihaler 18 Mcg Caps (Tiotropium bromide monohydrate) .... Inhale 1 capsule by mouth once a day 16)  Sucralfate 1 Gm Tabs (Sucralfate) .... Take 1 tablet by mouth four times a day 17)  Hyzaar 100-25 Mg Tabs (Losartan potassium-hctz) .... Take 1 tablet a day 18)  Metoprolol Succinate 25 Mg Tb24 (Metoprolol succinate) .... 1/2 tab two times a day   Patient Instructions: 1)  Stop Symbacort and use limited albuterol, stop pill prescribed by Dr. Vassie Loll (this will cause jittery feeling) 2)  Metoprolol 12.5 mg two times a day start  3)  Apt with Dr. Lloyd Huger 8/20    Prescriptions: METOPROLOL SUCCINATE 25 MG TB24 (METOPROLOL SUCCINATE) 1/2 tab two times a day  #30 x 3   Entered and Authorized by:   Luretha Murphy NP   Signed by:   Luretha Murphy NP on 04/13/2007   Method used:   Print then Give to Patient   RxID:   [id number]        Laboratory Results   Blood Tests   Date/Time Recieved: April 13, 2007 2:14  PM  Date/Time Reported: April 13, 2007 2:19 PM   CBG Random: 220  Comments: ...............test performed  by......Marland KitchenBonnie A. [place], [place] (ASCP)

## 2010-10-02 NOTE — Miscellaneous (Signed)
  refilled lantus for 28 u once daily, disp 1 m or 3 m w 1 y refills sent by fax [facility]. Clinical Lists Changes

## 2010-10-02 NOTE — Letter (Signed)
Summary: LAB  Letter  [facility]  [address]   [place], [place] [address]   Phone: [phone]  Fax: [phone]    11/03/2007  [name] [name] [address] [name], [place]  [address]  Dear Ms. Baucum,  Your A1C was 7.5 which is GOOD! All of your other lab work was normal except a very slight elevation in your sodium level.  I need to recheck this at your convenience, sometime in the next 2-3 weeks would be ideal. I have sent a lab order through so you can either call for a LAB ONLY Appointment or if you are in to see Dr Raymondo Band you can get it done then. YOU DO NOT NEED TO FAST for this test.  Please call me with questions. Everything else looked A OK!         Sincerely,   Denny Levy MD [facility]  Appended Document: LAB  Letter     Clinical Lists Changes  Problems: Added new problem of HYPERNATREMIA (ICD-276.0) Orders: Added new Test order of Basic [facility] [phone]) - Signed        Complete Medication List: 1)  Lantus Solostar 100 Unit/ml Soln (Insulin glargine) .... As directed 26 units each morning 2)  Hyzaar 100-25 Mg Tabs (Losartan potassium-hctz) .[name].. 1 by mouth once daily 3)  Metoprolol Succinate 25 Mg Tb24 (Metoprolol succinate) .... Take one tab  two times a day 4)  Clonidine Hcl 0.1 Mg Tabs (Clonidine hcl) .[name].. 1 tablet by mouth twice a day 5)  Crestor 20 Mg Tabs (Rosuvastatin calcium) .... Take 1 tablet by mouth once a day 6)  Lasix 20 Mg Tabs (Furosemide) .... Take 1 tablet by mouth twice a day 7)  Metformin Hcl 1000 Mg Tabs (Metformin hcl) .... Take 1.5  tablets by mouth in in the am and 1 tablets in the pm 8)  Nexium 40 Mg Cpdr (Esomeprazole magnesium) .[name].. 1 capsule by mouth twice a day 9)  Reglan 10 Mg Tabs (Metoclopramide hcl) .... 1/2 three times a day with meals and 1 at bedtime 10)  Singulair 10 Mg Tabs (Montelukast sodium) .... Take 1 tablet by mouth once a day 11)   Sucralfate 1 Gm Tabs (Sucralfate) .... Take 1 tablet by mouth four times a day 12)  Nitroglycerin 0.4 Mg Subl (Nitroglycerin) .... Place 1 tablet under tongue as directed 13)  Acyclovir 800 Mg Tabs (Acyclovir) .... Take 1 tabletb id/qd as directed 14)  Albuterol Sulfate 1.25 Mg/71ml Nebu (Albuterol sulfate) .... Inhale 1 vial as directed twice a day 15)  Bayer Aspirin 325 Mg Tabs (Aspirin) .... Take 1 tablet by mouth once a day 16)  Proventil Hfa 108 (90 Base) Mcg/act Aers (Albuterol sulfate) .... Inhale 2 puff using inhaler every four hours 17)  Flexfot Cpap Mask 407  18)  Lancets For Glucometer Testing  19)  Novolog Flexpen 100 Unit/ml Soln (Insulin aspart) .... 8 units prior to breakfast 10 units prior to evening meal supply one box of pens 20)  Potassium Chloride 20 Meq Pack (Potassium chloride) .... One daily 21)  Zyrtec Allergy 10 Mg Tabs (Cetirizine hcl) .[name].. 1 by mouth at bedtime 22)  Qvar 80 Mcg/act Aers (Beclomethasone dipropionate) .... 2 puffs twice daily 23)  Xalatan 0.005 % Soln (Latanoprost) .[name].. 1 drop each eye at bedtime  Appended Document: LAB  Letter letter sent via mail to pt ..................Marland KitchenDelores [name], CMA (AAMA)

## 2010-10-02 NOTE — Progress Notes (Signed)
Summary: QUESTIONS ABOUT MEDICATIONS  Phone Note Call from Patient Call back at Home Phone [phone]   Caller: Patient Summary of Call: PT HAVE  QUESTIONS ABOUT MEDICATIONS Initial call taken by: Judie Grieve,  September 22, 2009 9:02 AM  Follow-up for Phone Call        called this a.m " the party doesn't except call." Follow-up by: Oswald Hillock,  September 22, 2009 9:57 AM

## 2010-10-02 NOTE — Assessment & Plan Note (Signed)
Summary: foot pain,df   Vital Signs:  Patient profile:   60 year old female Height:      61 inches Weight:      192.6 pounds BMI:     36.52 Temp:     98.4 degrees F Pulse rate:   90 / minute BP sitting:   157 / 84  (left arm)  Vitals Entered By: Theresia Lo RN (April 28, 2009 1:44 PM) CC: right foot pain Is Patient Diabetic? Yes  Pain Assessment Patient in pain? yes     Location: right foot Intensity: 10 Type: sharp   Primary Care Provider:  Denny Levy MD  CC:  right foot pain.  History of Present Illness: 1.  right foot pain--Exquisite pain base of right first toe.  +swelling.  radiates across transverse arch.  pain is sharp.  been going on for 1 week, but worse yesterday.  having trouble walking to the point that she needs a walker.  no numbness or tingling (except for big toe, which is always numb).    Habits & Providers  Alcohol-Tobacco-Diet     Tobacco Status: quit  Allergies: 1)  Darvon (Propoxyphene Hcl) 2)  Diovan (Valsartan) 3)  Flagyl (Metronidazole) 4)  Norvasc (Amlodipine Besylate) 5)  Prinzide (Lisinopril-Hydrochlorothiazide)  Social History: Smoking Status:  quit  Physical Exam  General:  alert and well-developed.   Extremities:  right medial foot slightly swollen, mildly erythematous, exquistely ttp, esp over 1st MTP joint.  limited rom secondary to pain.  2+ dp pulses.  slightly decreased sensation over plantar surface 1st toe.   Additional Exam:  vital signs reviewed  elevated bp noted   Impression & Recommendations:  Problem # 1:  ? of ACUTE GOUTY ARTHROPATHY (ICD-274.01) attempted joint aspiration, but no fluid returned.    see procedure note below.  high dose ibuprofen.  toradol at office today.  check uric acid.   Procedure note:  verbal consent obtained.  time out completed.  Areas prepped with alcohol.  attempted to aspirate fluid from MTP joint, but no fluid returned.    Pt tolerated procedure well.  No complications.   Her  updated medication list for this problem includes:    Ibuprofen 800 Mg Tabs (Ibuprofen) .[name]... 1 tab by mouth three times a day for pain.    Allopurinol 300 Mg Tabs (Allopurinol) .[name]... 1 tablet every day start on sept.2nd  Orders: Uric Acid-[facility] (60454-09811) Ketorolac-Toradol 15mg  (B1478) [facility]- Est Level  3 (29562)  Problem # 2:  HYPERTENSION, BENIGN SYSTEMIC (ICD-401.1) elevated today, but has not taken meds.  pharmacy to fax refill request  so that we know which meds she is out of. Her updated medication list for this problem includes:    Hyzaar 100-25 Mg Tabs (Losartan potassium-hctz) .[name]... 1 by mouth once daily    Lopressor 50 Mg Tabs (Metoprolol tartrate) .[name]..[name] Two times a day    Clonidine Hcl 0.1 Mg Tabs (Clonidine hcl) .[name]... 1 tablet by mouth twice a day    Lasix 20 Mg Tabs (Furosemide) .[name]... Take 1 tablet by mouth twice a day  Complete Medication List: 1)  Novolog Mix 70/30 Flexpen 70-30 % Susp (Insulin aspart prot & aspart) .... 28 units prior and breakfast and 28 units prior to evening meal.  dispense one month supply. 2)  Metformin Hcl 1000 Mg Tabs (Metformin hcl) .... Take 1.5  tablets by mouth in in the am and 1 tablets in the pm 3)  Potassium Chloride Crys Cr 20 Meq Cr-tabs (  Potassium chloride crys cr) .... 2 in the am and 1 in the pm 4)  Hyzaar 100-25 Mg Tabs (Losartan potassium-hctz) .[name].. 1 by mouth once daily 5)  Lopressor 50 Mg Tabs (Metoprolol tartrate) .... Two times a day 6)  Clonidine Hcl 0.1 Mg Tabs (Clonidine hcl) .[name].. 1 tablet by mouth twice a day 7)  Lasix 20 Mg Tabs (Furosemide) .... Take 1 tablet by mouth twice a day 8)  Simvastatin 40 Mg Tabs (Simvastatin) .[name].. 1 by mouth once daily 9)  Nexium 40 Mg Cpdr (Esomeprazole magnesium) .[name].. 1 capsule by mouth twice a day 10)  Acyclovir 800 Mg Tabs (Acyclovir) .... Take 1 tabletb id/qd as directed 11)  Lancets For Glucometer Testing  12)  Brovana 15 Mcg/37ml Nebu (Arformoterol tartrate) .[name].. 1 neb every twice daily if  needed 13)  Bayer Aspirin 325 Mg Tabs (Aspirin) .... Take 1 tablet by mouth once a day 14)  Flexfot Cpap Mask 407  15)  Zyrtec Allergy 10 Mg Tabs (Cetirizine hcl) .[name].. 1 by mouth at bedtime 16)  Qvar 80 Mcg/act Aers (Beclomethasone dipropionate) .... 2 puffs twice daily 17)  Xalatan 0.005 % Soln (Latanoprost) .[name].. 1 drop each eye at bedtime 18)  Cpap 12 Advanced  19)  Allergy Vaccine Gh 1:50  20)  Allergy Vaccine Gh Advance To 1:10 Next Order  .... Build per protocol 21)  Trusopt 2 % Soln (Dorzolamide hcl) .[name].. 1 drop in left eye twice daily 22)  Astepro 137 Mcg/spray Soln (Azelastine hcl) .... 2 sprays daily as needed 23)  Singulair 10 Mg Tabs (Montelukast sodium) .... One by mouth qd 24)  Zoloft 100 Mg Tabs (Sertraline hcl) .[name].. 1 1/2 tabs by mouth qd 25)  Promethazine-codeine 6.25-10 Mg/37ml Syrp (Promethazine-codeine) .[name].. 1 teaspoon qid as needed cough 26)  Ibuprofen 800 Mg Tabs (Ibuprofen) .[name].. 1 tab by mouth three times a day for pain. 27)  Oxycodone Hcl 5 Mg Tabs (Oxycodone hcl) .[name].. 1 tab by mouth three times a day as needed next 2 days for pain if not controlled with ibuprofen 28)  Prednisone 20 Mg Tabs (Prednisone) .... 3 pills times 3 days 29)  Allopurinol 300 Mg Tabs (Allopurinol) .[name].. 1 tablet every day start on sept.2nd  Patient Instructions: 1)  It was nice to meet you today. 2)  For your toe, take the ibuprofen I prescribed you three times a day.  continue to take it until your pain has been gone for 3 days. 3)  If the ibuprofen does not control your pain, you can take an oxycodone. 4)  Please schedule a follow-up appointment in 1 month for follow up.  Prescriptions: OXYCODONE HCL 5 MG TABS (OXYCODONE HCL) 1 tab by mouth three times a day as needed next 2 days for pain if not controlled with ibuprofen  #6 x 0   Entered and Authorized by:   Asher Muir MD   Signed by:   Asher Muir MD on 04/28/2009   Method used:   Print then Give to Patient   RxID:    [id number] IBUPROFEN 800 MG TABS (IBUPROFEN) 1 tab by mouth three times a day for pain.  #60 x 0   Entered and Authorized by:   Asher Muir MD   Signed by:   Asher Muir MD on 04/28/2009   Method used:   Print then Give to Patient   RxID:   [id number]    Medication Administration  Injection # 1:    Medication: Ketorolac-Toradol 15mg   Diagnosis: ? of ACUTE GOUTY ARTHROPATHY (ICD-274.01)    Route: IM    Site: L deltoid    Exp Date: [id number]    Lot #: [id number]    Mfr: hospira    Comments: pt given 30 mg of toradol    Patient tolerated injection without complications    Given by: Alphia Kava (April 28, 2009 2:32 PM)  Orders Added: 1)  Uric Acid-[facility] [95621-30865] 2)  Ketorolac-Toradol 15mg  [J1885] 3)  [facility]- Est Level  3 [78469]

## 2010-10-02 NOTE — Miscellaneous (Signed)
Summary: Injection Record/[facility]  Injection Record/[facility]   Imported By: Sherian Rein 01/03/2010 15:17:12  _____________________________________________________________________  External Attachment:    Type:   Image     Comment:   External Document

## 2010-10-02 NOTE — Miscellaneous (Signed)
Summary: LASIX refill   Clinical Lists Changes refill for lasix 20 mg two times a day faxed w 12 refills

## 2010-10-02 NOTE — Assessment & Plan Note (Signed)
Summary: F/U Rx Clinic Diabetes and Hypertension   Vital Signs:  Patient Profile:   60 Years Old Female Height:     61.5 inches Weight:      181.4 pounds BMI:     33.84 Pulse rate:   76 / minute BP sitting:   137 / 79  (left arm)                 Diabetes Management History:      She is (or has been) enrolled in the "Diabetic Education Program".  She states understanding of dietary principles.  She is checking home blood sugars.  She says that she is not exercising regularly.        Hypoglycemic symptoms are not occurring.  No hyperglycemic symptoms are reported.        Her home fasting blood sugars are as follows: highest: 222; lowest: 111; average: 170.     Current Allergies (reviewed today): DARVON (PROPOXYPHENE HCL) DIOVAN (VALSARTAN) FLAGYL (METRONIDAZOLE) METOPROLOL TARTRATE (METOPROLOL TARTRATE) NORVASC (AMLODIPINE BESYLATE) PRINZIDE (LISINOPRIL-HYDROCHLOROTHIAZIDE)     Review of Systems       The patient complains of dyspnea on exhertion.       Impression & Recommendations:  Problem # 1:  DIABETES MELLITUS II, UNCOMPLICATED (ICD-250.00) Assessment: Deteriorated Despite recent weight loss of 20 pounds over the last 3 months patient has worsened glycemic control.  Patient appear adherent with previous dose of Lantus.  We switched dosing time to AM as patient reports forgetting a few times to take lantus.  Patient was able to verbalize goals for fasting CBGs.   Patient has established weight loss goal of <170 pounds (additional 11 pounds)  by the end of the year. Increased Lantus to 16 units each morning.  The following medications were removed from the medication list:    Lantus Solostar 100 Unit/ml Soln (Insulin glargine) ..... Inject 10 unit subcutaneously every night  Her updated medication list for this problem includes:    Bayer Aspirin 325 Mg Tabs (Aspirin) .[name]... Take 1 tablet by mouth once a day    Glipizide 10 Mg Tb24 (Glipizide) .[name]... Take 1 tablet  by mouth twice a day    Metformin Hcl 1000 Mg Tabs (Metformin hcl) .[name]... Take 1 tablet by mouth twice a day    Lantus Solostar 100 Unit/ml Soln (Insulin glargine) .[name]... As directed 16 units each morning  The following medications were removed from the medication list:    Lantus Solostar 100 Unit/ml Soln (Insulin glargine) ..... Inject 8 unit subcutaneously every night  Her updated medication list for this problem includes:    Bayer Aspirin 325 Mg Tabs (Aspirin) .[name]... Take 1 tablet by mouth once a day    Glipizide 10 Mg Tb24 (Glipizide) .[name]... Take 1 tablet by mouth twice a day    Metformin Hcl 1000 Mg Tabs (Metformin hcl) .[name]... Take 1 tablet by mouth twice a day    Lantus Solostar 100 Unit/ml Soln (Insulin glargine) .[name]... As directed 16 units each morning  Orders: Reassessment Each 15 min unit- [facility] (91478)   Problem # 2:  HYPERTENSION, BENIGN SYSTEMIC (ICD-401.1) Assessment: Deteriorated Patient reports not having HYZAAR prescription.  However, she continues to take both Cozaar and HCTZ at this this.  Will continue with both agents until one of them runs out and then switch to hyzaar 100/25.  This will decrease pill burden.  Reevaluate use of Lasix (history of ankle edema which resolved).   Her updated medication list for this problem  includes:    Metoprolol Succinate 25 Mg Tb24 (Metoprolol succinate) .[name]... Take one tab  two times a day    Clonidine Hcl 0.1 Mg Tabs (Clonidine hcl) .[name]... 1 tablet by mouth twice a day    Lasix 20 Mg Tabs (Furosemide) .[name]... Take 1 tablet by mouth twice a day  Orders: Reassessment Each 15 min unit- [facility] (09811)   Problem # 3:  ? of ASTHMA (ICD-493.90) Patient reports episodes of dyspnea.  These are not new.  She has been using her albuterol sparingly.  Interestingly, she does report improvement in her breathing with the iniation of singulair.   Patient is only able to afford once daily Nexium and is concerned that she may not have enough to last her until  next month because she takes two on days when her symptoms are severe.   Samples # 15 provided to supply enough for next 30 days.  CONSIDER use of inhaled steroid Without Beta agonist as possible way to improve breathing and decrease symptoms of dyspnea.  Her updated medication list for this problem includes:    Albuterol Sulfate 1.25 Mg/15ml Nebu (Albuterol sulfate) ..... Inhale 1 vial as directed twice a day    Proventil Hfa 108 (90 Base) Mcg/act Aers (Albuterol sulfate) ..... Inhale 2 puff using inhaler every four hours    Singulair 10 Mg Tabs (Montelukast sodium) .[name]... Take 1 tablet by mouth once a day    Spiriva Handihaler 18 Mcg Caps (Tiotropium bromide monohydrate) ..... Inhale 1 capsule by mouth once a day   Complete Medication List: 1)  Metoprolol Succinate 25 Mg Tb24 (Metoprolol succinate) .... Take one tab  two times a day 2)  Albuterol Sulfate 1.25 Mg/4ml Nebu (Albuterol sulfate) .... Inhale 1 vial as directed twice a day 3)  Bayer Aspirin 325 Mg Tabs (Aspirin) .... Take 1 tablet by mouth once a day 4)  Clonidine Hcl 0.1 Mg Tabs (Clonidine hcl) .[name].. 1 tablet by mouth twice a day 5)  Crestor 20 Mg Tabs (Rosuvastatin calcium) .... Take 1 tablet by mouth once a day 6)  Glipizide 10 Mg Tb24 (Glipizide) .... Take 1 tablet by mouth twice a day 7)  Lasix 20 Mg Tabs (Furosemide) .... Take 1 tablet by mouth twice a day 8)  Metformin Hcl 1000 Mg Tabs (Metformin hcl) .... Take 1 tablet by mouth twice a day 9)  Nexium 40 Mg Cpdr (Esomeprazole magnesium) .[name].. 1 capsule by mouth twice a day 10)  Proventil Hfa 108 (90 Base) Mcg/act Aers (Albuterol sulfate) .... Inhale 2 puff using inhaler every four hours 11)  Reglan 10 Mg Tabs (Metoclopramide hcl) .... Take 1 tablet by mouth twice a day 12)  Singulair 10 Mg Tabs (Montelukast sodium) .... Take 1 tablet by mouth once a day 13)  Spiriva Handihaler 18 Mcg Caps (Tiotropium bromide monohydrate) .... Inhale 1 capsule by mouth once a day 14)  Sucralfate 1  Gm Tabs (Sucralfate) .... Take 1 tablet by mouth four times a day 15)  Nitroglycerin 0.4 Mg Subl (Nitroglycerin) .... Place 1 tablet under tongue as directed 16)  Acyclovir 800 Mg Tabs (Acyclovir) .... Take 1 tablet daily 17)  Lantus Solostar 100 Unit/ml Soln (Insulin glargine) .... As directed 16 units each morning  Diabetes Management Assessment/Plan:      The following lipid goals have been established for the patient: Total cholesterol goal of 200; LDL cholesterol goal of 100; HDL cholesterol goal of 40; Triglyceride goal of 200.     Patient Instructions:  1)  Keep appointment with Dr. Jennette Kettle on Monday 2)  Continue Cozaar 100mg   and  HCTZ until one of them runs out then. start HYZAAR 100/25mg  daily in the AM. 3)  No Lantus this evening. 4)  Increase Lantus to 16 units each morning.  5)  Take Nexium daily.  Use samples as needed for bad days. 6)  Schedule f/u in pharmacy clinic in 2-4 weeks.    Prescriptions: NEXIUM 40 MG CPDR (ESOMEPRAZOLE MAGNESIUM) 1 capsule by mouth twice a day  #60 x 0   Entered by:   Madelon Lips PHARMD   Authorized by:   Default Provider   Signed by:   Madelon Lips PHARMD on 05/21/2007   Method used:   Historical   RxID:   [id number] LANTUS SOLOSTAR 100 UNIT/ML  SOLN (INSULIN GLARGINE) as directed 16 units each morning  #3 boxes x 3   Entered by:   Madelon Lips PHARMD   Authorized by:   Default Provider   Signed by:   Madelon Lips PHARMD on 05/21/2007   Method used:   Historical   RxID:   [id number]  ]

## 2010-10-02 NOTE — Progress Notes (Signed)
  Phone Note Outgoing Call   Summary of Call: [facility] [employer] please call [name] [name] and tell her the x ray was negative for anything worrisome. See if she is feeling better and if not let me know Thanks! PS--also see if she got the new rx for the new insulin pen Thanks!  Denny Levy MD  May 03, 2010 5:27 PM   Follow-up for Phone Call        LVM for patient to call back office Follow-up by: Jimmy Footman, CMA,  May 04, 2010 8:32 AM  Additional Follow-up for Phone Call Additional follow up Details #1::        Spoke with patient she states that she is going to pick up insulin pen today. She also states that her back is still hurting her to the point she cannot walk. Did inform her that x ray were negative Additional Follow-up by: Jimmy Footman, CMA,  May 04, 2010 8:57 AM

## 2010-10-02 NOTE — Assessment & Plan Note (Signed)
Summary: Cardiology Nuclear Study  Nuclear Med Background Indications for Stress Test: Evaluation for Ischemia   History: COPD, Echo, Heart Catheterization  History Comments: 2005- Cath- N/O CAD. Nl. EF 208- Echo- Nl. LV Fx. OSA(CPAP); GERD  Symptoms: Chest Pain, DOE, SOB    Nuclear Pre-Procedure Cardiac Risk Factors: History of Smoking, Hypertension, IDDM Type 2, Lipids Caffeine/Decaff Intake: none NPO After: 7:00 AM Lungs: clear IV 0.9% NS with Angio Cath: 20g     IV Site: (R) AC IV Started by: Stanton Kidney EMT-P Chest Size (in) 38     Cup Size D     Height (in): 61.5 Weight (lb): 188 BMI: 35.07 Tech Comments: Lopressor held > 24 hours. CBG= 137 @ 7 am today, per Pt.  Nuclear Med Study 1 or 2 day study:  1 day     Stress Test Type:  Dobutamine Reading MD:  Arvilla Meres, MD     Referring MD:  P.Ross Resting Radionuclide:  Technetium 29m Tetrofosmin     Resting Radionuclide Dose:  11 mCi  Stress Radionuclide:  Technetium 83m Tetrofosmin     Stress Radionuclide Dose:  33 mCi   Stress Protocol Exercise Time (min):  12:00 min     Max HR:  150 bpm     Predicted Max HR: 163 bpm  Max Systolic BP: 192 mm Hg     % Max HR:  92 %Rate Pressure Product:  16109  Dose of Atropine: .5 mg mg  Dose of Dobutamine:  mcg/kg/min (at max HR)  Stress Test Technologist:  Milana Na EMT-P     Nuclear Technologist:  Domenic Polite CNMT  Rest Procedure  Myocardial perfusion imaging was performed at rest 45 minutes following the intraveneous administration of Myoview Technetium 36m Tetrofosmin.  Stress Procedure  The patient received IV dobutamine and Atropine .5mg  IVP. There were no significant changes, + chest burning with infusion. Myoview was injected at peak heart rate and quantitative spect images were obtained after a 45 minute delay.  QPS Raw Data Images:  Normal; no motion artifact; normal heart/lung ratio. Stress Images:  NI: Uniform and normal uptake of tracer in all  myocardial segments. Rest Images:  Normal homogeneous uptake in all areas of the myocardium. Subtraction (SDS):  Normal Transient Ischemic Dilatation:  .89  (Normal <1.22)  Lung/Heart Ratio:  .25  (Normal <0.45)  Quantitative Gated Spect Images QGS EDV:  69 ml QGS ESV:  18 ml QGS EF:  74 % QGS cine images:  Normal.  Findings Normal nuclear study      Overall Impression  Exercise Capacity: Dobutamine study with no exercise. Clinical Symptoms: Chest burning ECG Impression: Baseline: NSR; No significant ST segment change with dobutamine. Overall Impression: Normal stress nuclear study.  Appended Document: Cardiology Nuclear Study Normal exam.  Appended Document: Cardiology Nuclear Study Lm for call back.  Appended Document: Cardiology Nuclear Study Called pt. with results.

## 2010-10-02 NOTE — Progress Notes (Signed)
   Phone Note Outgoing Call   Summary of Call: Triage can you check on her pre-authorization for her nexium--we sent ot out (it is scanned into her chart) and [facility] says they still do not have the med? Thanks!  Denny Levy MD  Jan 04, 2009 4:40 PM   Follow-up for Phone Call        refaxed the prior auth & called pt to let her know Follow-up by: Golden Circle RN,  Jan 04, 2009 4:50 PM

## 2010-10-02 NOTE — Assessment & Plan Note (Signed)
Summary: rov 4 months ///kp   Primary Provider/Referring Provider:  Denny Levy MD  CC:  4 month follow up visit.  History of Present Illness: 08/12/08- Allergic rhinitis Dry hacking cough- thinks she would feel better if she could cough something up. Called in cough syrup. We advanced vaccine to 1:10, GH. Still don't find speech therapy report. Has known vocal cord paresis and considered high probability that this is related to her cough. Tessalon no help even at 200 mg. Antibiotics do help for awhile.  6/10/110- allergic rhinitis Got through winter and Spring pollen. Now aware off and on drawing sensation upper chest past several months. cough x 3 weeks, dry. Denies fever or purulence. No sore throat but questions glands in neck.  Says [facility] will be sending script for her to get a new machine. Continues allergy vaccine without problems.  August 07, 2009- Allergic rhinits, OSA At last pressure check in August She had good compliance with 86% of days used over 4 hours. Despite humidifier it dries her nose  with winter heat. CPAP 12 gave AHI 8.7. She agrees to try a step up. Allergy vaccine continues here at 1:10, doing well. She gives herself a neb when needed. Did get flu vax Little routine wheeze or cough. Little sinus congestion or sneeze.  December 05, 2009- Allergic rhinitis, OSA CPAP- Nasal pillows mask sometimes irritate her nose and she will skip for the night. Pressure is ok and she admits she sleeps better when she wears it. Pressure 12/ [facility]. Wears it over 75% of nights at least 4 hours. Allergy- Still easy dyspnea with activity, showering etc. Little change day-to-day. Some dry cough and light wheeze several days per week. Singulair works, nasal spray used when needed. Zyrtec overdries and is used little. Gets her allergy vaccine here without problems.    Current Medications (verified): 1)  Novolog Mix 70/30 Flexpen 70-30 % Susp (Insulin Aspart Prot & Aspart) ....  28 Units Prior and Breakfast and 28 Units Prior To Evening Meal.  Dispense One Month Supply. 2)  Metformin Hcl 1000 Mg Tabs (Metformin Hcl) .... Take 1.5  Tablets By Mouth in in The Am and 1 Tablets in The Pm 3)  Potassium Chloride 40 Meq/33ml (20%) Liqd (Potassium Chloride) .[name].. 15 Cc Two Times A Day Disp Qs 3 M 4)  Hyzaar 100-25 Mg  Tabs (Losartan Potassium-Hctz) .[name].. 1 By Mouth Once Daily 5)  Lopressor 50 Mg Tabs (Metoprolol Tartrate) .... Two Times A Day 6)  Clonidine Hcl 0.1 Mg Tabs (Clonidine Hcl) .[name].. 1 Tablet By Mouth Twice A Day 7)  Lasix 20 Mg Tabs (Furosemide) .... Take 1 Tablet By Mouth Twice A Day 8)  Simvastatin 40 Mg Tabs (Simvastatin) .[name].. 1 By Mouth Once Daily 9)  Nexium 40 Mg Cpdr (Esomeprazole Magnesium) .[name].. 1 Capsule By Mouth Twice A Day 10)  Acyclovir 800 Mg Tabs (Acyclovir) .... Take 1 Tabletb Id/qd As Directed 11)  Lancets For Glucometer Testing 12)  Brovana 15 Mcg/55ml Nebu (Arformoterol Tartrate) .[name].. 1 Neb Every Twice Daily If Needed 13)  Bayer Aspirin 325 Mg Tabs (Aspirin) .... Take 1 Tablet By Mouth Once A Day 14)  Flexfot Cpap Mask 407 15)  Zyrtec Allergy 10 Mg  Tabs (Cetirizine Hcl) .[name].. 1 By Mouth At Bedtime 16)  Qvar 80 Mcg/act  Aers (Beclomethasone Dipropionate) .... 2 Puffs Twice Daily 17)  Xalatan 0.005 %  Soln (Latanoprost) .[name].. 1 Drop Each Eye At Bedtime 18)  Cpap 12 [facility] 19)  Allergy Vaccine Gh 1:10 20)  Trusopt 2 % Soln (Dorzolamide Hcl) .[name].. 1 Drop in Left Eye Twice Daily 21)  Astepro 137 Mcg/spray Soln (Azelastine Hcl) .... 2 Sprays Daily As Needed 22)  Singulair 10 Mg Tabs (Montelukast Sodium) .... One By Mouth Qd 23)  Zoloft 100 Mg Tabs (Sertraline Hcl) .[name].. 1 1/2 Tabs By Mouth Qd 24)  Gabapentin 300 Mg Caps (Gabapentin) .[name].. 1 By Mouth Bid 25)  Allopurinol 300 Mg Tabs (Allopurinol) .[name].. 1 By Mouth Qd  Allergies (verified): 1)  Darvon (Propoxyphene Hcl) 2)  Diovan (Valsartan) 3)  Flagyl (Metronidazole) 4)  Norvasc (Amlodipine Besylate) 5)   Prinzide  Past History:  Past Medical History: Last updated: 05/01/2009 chronic cough--now followed by pulmonary moderate sleep apnea--cpap 12 Hypertension Dyslipidemia Diabetes Gout  ?hx vocal cord paresise normal 2008  Past Surgical History: Last updated: 10/28/2007 Anal fistula repair -,  Arthroscopy--R shoulder/R knee 12/01 - 10/07/2000,  breast reduction - 04/21/2003,  BTL--`87 -,  cardiac Cath--nl ef, no cad - 10/04/2003, echo--tricus regurg, mild pul htn, ef 60 - 03/02/2004,  ENT Naso-pharyn scope-normal - 09/11/2006,  Hysterectomy - 10/07/2000, l . rotator cuff surgery - 10/18/2003,  T-score: -.0.54 (low risk currently) - 09/02/2004 bronchoscopy for chronic cough 07/2007  Family History: Last updated: 10/30/2006 Father-, financial difficulties--receives meds from [facility] [id number]), Mohter-diabetes, no family hx colon ca  Social History: Last updated: 03/02/2008 Lives with teenage son (h/o mental dz).   approved for section 8 housing (12/03) Patient states former smoker.   Risk Factors: Exercise: yes (07/23/2007)  Risk Factors: Smoking Status: never (07/26/2009)  Review of Systems      See HPI       The patient complains of dyspnea on exertion.  The patient denies anorexia, fever, weight loss, weight gain, vision loss, decreased hearing, hoarseness, chest pain, syncope, peripheral edema, prolonged cough, headaches, hemoptysis, and severe indigestion/heartburn.    Vital Signs:  Patient profile:   60 year old female Height:      61.5 inches Weight:      200.38 pounds BMI:     37.38 O2 Sat:      99 % on Room air Pulse rate:   75 / minute BP sitting:   126 / 80  (left arm) Cuff size:   regular  Vitals Entered By: Reynaldo Minium CMA (December 05, 2009 2:39 PM)  O2 Flow:  Room air  Physical Exam  Additional Exam:  General: A/Ox3; pleasant and cooperative, NAD, alert, overweight, sat 100% room air at rest SKIN: no rash, lesions NODES: no lymphadenopathy HEENT: [place]/AT,  EOM- WNL, Conjuctivae- clear, PERRLA, limitied eye sight, squints with right, TM-WNL, Nose- clear, Throat- clear and wnl, .Mallampati  II, tongue stained yellow by something she was drinking. NECK: Supple w/ fair ROM, JVD- none, normal carotid impulses w/o bruits Thyroid- normal to palpation CHEST: Clear to P&A, no wheeze or cough today HEART: RRR, 2/6 syst murmur aortic space ABDOMEN: Soft  AVW:UJWJ, nl pulses, no edema  NEURO: Grossly intact to observation      Impression & Recommendations:  Problem # 1:  OBSTRUCTIVE SLEEP APNEA (ICD-327.23)  We emphasized the goals and medical reasons for cpap compliance and she is encouraged to review mask fit with her DME company.  Problem # 2:  ALLERGIC  RHINITIS (ICD-477.9)  She will continue current vaccine and meds.   Other Orders: Est. Patient Level III (19147)  Patient Instructions: 1)  Please schedule a follow-up appointment in 6 months. 2)  Please  make an effort to wear your cpap every night. Talk with your home care company about comfortable masks.  3)  We are leaving pressure at 12

## 2010-10-02 NOTE — Progress Notes (Signed)
Summary: triage  Phone Note Call from Patient Call back at Home Phone [phone]   Caller: Patient Summary of Call: Would like to know if her herniated disk is  causing her problems.  Knows Dr. Jennette Kettle is not here wanted to check with Dr. Leveda Anna and see what he says. Initial call taken by: Clydell Hakim,  Jan 01, 2010 9:03 AM  Follow-up for Phone Call        told her unlikely that another md will review & discuss, but I will send it to Dr. Leveda Anna. she is not using the gabapentin or Tylenol for the pain Follow-up by: Golden Circle RN,  Jan 01, 2010 9:11 AM  Additional Follow-up for Phone Call Additional follow up Details #1::        Called and discussed.  She has hx of Cervical HNP treated conservatively.  No new symptoms Reviewed nerve conduction studies and doubt cervical cause.  She will keep appointment with Dr. Jennette Kettle on 3/18. Additional Follow-up by: Doralee Albino MD,  Jan 01, 2010 10:43 AM

## 2010-10-02 NOTE — Letter (Signed)
Summary: LAB Letter  [facility]  [address]   [place], [place] [address]   Phone: [phone]  Fax: [phone]    05/03/2008  [name] [name] [address] [name], [place]  [address]  Dear [name],   Your lab work was satisfactory--electrolytes, kidney function and liver functions all in OK range.        Sincerely,   Denny Levy MD [facility]  Appended Document: LAB Letter Letter sent via mail to pt ..................Marland KitchenDelores Pate-Gaddy, CMA (AAMA)

## 2010-10-02 NOTE — Letter (Signed)
Summary: Letter: [facility]  [address]   [place], [place] [address]   Phone: [phone]  Fax: [phone]    11/18/2008 TO: SCAT Re:  [name] [name] [address] [name], [place]  [address]  Dear [name], I am [name]' physician and she has asked me tto send you a letter regarding her disabilities. Recently, her SCAT coverage was changed for home pick up to home pick up only at night.   I am requesting hatt this decision be reviewed. 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 She has pulmonary disease as well. I am concerned that asking her to walk 2 blocks to the bus stop, even during day light hours would put her at unnecessary health risks for two reasons: 1) from a pulmonary standpoint, that much exertion at one time is going to cause severe bronchospasm. 2) her vision is too poor to safely navigate more than 1/2 block even in daylight hours.  Please reconsider your decision for her transportation issues. I do appreciate your help. Please call me with questions           Sincerely,   Denny Levy MD [facility]  Appended Document: Letter: SCAT mailed to [name] ,public transportation Division mgr, by mouth [address] transit Eligibility Review committee  Appended Document: Letter: SCAT Mailed

## 2010-10-02 NOTE — Assessment & Plan Note (Signed)
Summary: F/U Diabetes   Vital Signs:  Patient profile:   60 year old female Height:      61 inches Weight:      190.6 pounds BMI:     36.14 Pulse rate:   73 / minute BP sitting:   110 / 71  (right arm)  Primary Care Provider:  Denny Levy MD   History of Present Illness: Patient reports walking inside at [facility].  Denies walking at the gym and not riding bike. Limited intake if fast food - overall diet at home is fair to good.  Denies hypoglycemic episodes.   Qvar use - admits to using only 1  time per day (some times skips second dose) - uses as needed for symptoms.   No change in hair loss.    Allergies: 1)  Darvon (Propoxyphene Hcl) 2)  Diovan (Valsartan) 3)  Flagyl (Metronidazole) 4)  Norvasc (Amlodipine Besylate) 5)  Prinzide (Lisinopril-Hydrochlorothiazide)   Impression & Recommendations:  Problem # 1:  DIABETES MELLITUS II, UNCOMPLICATED (ICD-250.00)  Patient with excellent glycemic control based on home CBG readings.   Denies hypoglycemia.  After change from Lantus to Novolog Mix for possible association with hair loss at last visit.   No change in dose at this time. Discussed increase in activity and goal for 5 pounds weight loss in the next 4 months.   Patient verbalized understanding of plan.  TTFFC:  35 minutes  Seen with  Medical students; Glenetta Borg & Fortune Egbulefu, and Eda Keys, Pharmacy Resident and Dierdre Highman, PharmD candidate.  Her updated medication list for this problem includes:    Novolog Mix 70/30 Flexpen 70-30 % Susp (Insulin aspart prot & aspart) .[name]... 28 units prior and breakfast and 28 units prior to evening meal.  dispense one month supply.    Metformin Hcl 1000 Mg Tabs (Metformin hcl) .[name]... Take 1.5  tablets by mouth in in the am and 1 tablets in the pm    Hyzaar 100-25 Mg Tabs (Losartan potassium-hctz) .[name]... 1 by mouth once daily    Bayer Aspirin 325 Mg Tabs (Aspirin) .[name]... Take 1 tablet by mouth once a day  Orders:  Reassessment Each 15 min unit- [facility] ([address])  Problem # 2:  UNSPECIFIED ALOPECIA (ICD-704.00)  No change in hair growth with recent change from Lantus to Novolog Mix.   Continue to follow.  Patient accepts hair loss may NOT be due to medication.   [facility]: [facility]  Problem # 3:  COPD (ICD-496) Assessment: Unchanged REeducated on use of QVAR 2 puffs twice daily  as a standard dose and then use her PRO-AIR as needed.   She understands to use more of her controller and less of her reliever medication following education.  Her updated medication list for this problem includes:    Brovana 15 Mcg/64ml Nebu (Arformoterol tartrate) .[name]... 1 neb every twice daily if needed    Qvar 80 Mcg/act Aers (Beclomethasone dipropionate) .[name]... 2 puffs twice daily    Singulair 10 Mg Tabs (Montelukast sodium) ..... One by mouth qd  Complete Medication List: 1)  Novolog Mix 70/30 Flexpen 70-30 % Susp (Insulin aspart prot & aspart) .... 28 units prior and breakfast and 28 units prior to evening meal.  dispense one month supply. 2)  Metformin Hcl 1000 Mg Tabs (Metformin hcl) .... Take 1.5  tablets by mouth in in the am and 1 tablets in the pm 3)  Potassium Chloride Crys Cr 20 Meq Cr-tabs (Potassium  chloride crys cr) .[name].. 1 by mouth once daily please dispense capsules if available 4)  Hyzaar 100-25 Mg Tabs (Losartan potassium-hctz) .[name].. 1 by mouth once daily 5)  Lopressor 50 Mg Tabs (Metoprolol tartrate) .... Two times a day 6)  Clonidine Hcl 0.1 Mg Tabs (Clonidine hcl) .[name].. 1 tablet by mouth twice a day 7)  Lasix 20 Mg Tabs (Furosemide) .... Take 1 tablet by mouth twice a day 8)  Simvastatin 40 Mg Tabs (Simvastatin) .[name].. 1 by mouth once daily 9)  Nexium 40 Mg Cpdr (Esomeprazole magnesium) .[name].. 1 capsule by mouth twice a day 10)  Acyclovir 800 Mg Tabs (Acyclovir) .... Take 1 tabletb id/qd as directed 11)  Lancets For Glucometer Testing  12)  Brovana 15 Mcg/7ml Nebu (Arformoterol  tartrate) .[name].. 1 neb every twice daily if needed 13)  Bayer Aspirin 325 Mg Tabs (Aspirin) .... Take 1 tablet by mouth once a day 14)  Flexfot Cpap Mask 407  15)  Zyrtec Allergy 10 Mg Tabs (Cetirizine hcl) .[name].. 1 by mouth at bedtime 16)  Qvar 80 Mcg/act Aers (Beclomethasone dipropionate) .... 2 puffs twice daily 17)  Xalatan 0.005 % Soln (Latanoprost) .[name].. 1 drop each eye at bedtime 18)  Cpap 12 [facility]  19)  Allergy Vaccine Gh 1:50  20)  Allergy Vaccine Gh [facility] To 1:10 Next Order  .... Build per protocol 21)  Trusopt 2 % Soln (Dorzolamide hcl) .[name].. 1 drop in left eye twice daily 22)  Astepro 137 Mcg/spray Soln (Azelastine hcl) .... 2 sprays daily as needed 23)  Singulair 10 Mg Tabs (Montelukast sodium) .... One by mouth qd 24)  Zoloft 100 Mg Tabs (Sertraline hcl) .[name].. 1 1/2 tabs by mouth qd 25)  Promethazine-codeine 6.25-10 Mg/75ml Syrp (Promethazine-codeine) .[name].. 1 teaspoon qid as needed cough  Patient Instructions: 1)  Reschedule 2-3 months with Dr. Jennette Kettle 2)  Be active - Walk more at [employer] 3)  Biking once a day then twice a day and then 3 times per day by December. 4)  Keep eating healthy! 5)  Lose at least 5 pounds by [facility] if you lose more.Marland KitchenMarland KitchenGreat! 6)  Plan on Pharmacy Clinic Visit with Paulino Rily in December.    Prevention & Chronic Care Immunizations   Influenza vaccine: given  (06/08/2008)   Influenza vaccine due: 06/08/2009    Tetanus booster: 03/02/2008: given   Tetanus booster due: 03/02/2018    Pneumococcal vaccine: Done.  (06/02/1993)   Pneumococcal vaccine due: None  Colorectal Screening   Hemoccult: not indicated  (06/09/2008)   Hemoccult due: Not Indicated    Colonoscopy: Done.  (10/31/2005)   Colonoscopy due: 11/01/2015  Other Screening   Pap smear: Not documented   Pap smear due: Not Indicated    Mammogram: Normal  (04/19/2008)   Mammogram due: 04/19/2009    Smoking status: never  (01/04/2009)  Diabetes Mellitus   HgbA1C: 6.6   (03/09/2009)   Hemoglobin A1C due: 07/29/2008    Eye exam: Not documented    Foot exam: Not documented   High risk foot: Not documented   Foot care education: Not documented    Urine microalbumin/creatinine ratio: Not documented    Diabetes flowsheet reviewed?: Yes   Progress toward A1C goal: At goal  Hypertension   Last Blood Pressure: 110 / 71  (04/07/2009)   Serum creatinine: 0.71  (09/14/2008)   Serum potassium 4.2  (09/14/2008)    Hypertension flowsheet reviewed?: Yes   Progress toward BP goal: At goal  Lipids   Total  Cholesterol: 93  (01/04/2009)   LDL: 47  (01/04/2009)   LDL Direct: 55  (07/02/2007)   HDL: 30  (01/04/2009)   Triglycerides: 81  (01/04/2009)    SGOT (AST): 36  (09/14/2008)   SGPT (ALT): 34  (09/14/2008)   Alkaline phosphatase: 78  (09/14/2008)   Total bilirubin: 0.3  (09/14/2008)    Lipid flowsheet reviewed?: Yes   Progress toward LDL goal: At goal  Self-Management Support :   Personal Goals (by the next clinic visit) :     Personal A1C goal: 7  (04/07/2009)     Personal blood pressure goal: 130/80  (04/07/2009)     Personal LDL goal: 70  (04/07/2009)    Patient will work on the following items until the next clinic visit to reach self-care goals:     Medications and monitoring: check my blood sugar, check my blood pressure  (04/07/2009)     Eating: drink diet soda or water instead of juice or soda  (04/07/2009)     Activity: take a 30 minute walk every day  (04/07/2009)     Home glucose monitoring frequency: 1 time daily  (04/07/2009)    Diabetes self-management support: Written self-care plan  (04/07/2009)   Diabetes care plan printed    Hypertension self-management support: Not documented    Hypertension self-management support not done because: Good outcomes  (04/07/2009)    Lipid self-management support: Not documented     Lipid self-management support not done because: Good outcomes  (04/07/2009)    Self-management comments: Will  work on Increase in exercise and attempt to lose 5 pounds in 4 months

## 2010-10-02 NOTE — Progress Notes (Signed)
Summary: rx req  Phone Note Call from Patient   Caller: Patient Call For: alva Summary of Call: pt like the sample of asterpro. would like a rx for this. [facility] [phone].  pt can be reached at [phone]. this msg per tammy scott. Initial call taken by: Tivis Ringer,  June 28, 2008 3:20 PM  Follow-up for Phone Call        pt was given samples of Astepro at last ov with CY on 04-14-08.  LMOM at # provided informing pt rx sent to pharmacy.  Cyndia Diver LPN  June 28, 2008 3:36 PM       Prescriptions: ASTEPRO 137 MCG/SPRAY SOLN (AZELASTINE HCL) 2 sprays daily as needed  #1 x 4   Entered by:   Cyndia Diver LPN   Authorized by:   Waymon Budge MD   Signed by:   Cyndia Diver LPN on 08/65/7846   Method used:   Telephoned to ...       [facility] / [facility] (retail)       [address]       [place], [place]  [address]       Ph: [phone]       Fax: [phone]   RxID:   [id number]

## 2010-10-02 NOTE — Assessment & Plan Note (Signed)
Summary: F/U [facility] Rx [facility]  Medications Added LANTUS SOLOSTAR 100 UNIT/ML  SOLN (INSULIN GLARGINE) as directed 26 units each morning METFORMIN HCL 1000 MG TABS (METFORMIN HCL) Take 1.5  tablets by mouth in in the AM and 1 tablets in the PM NOVOLOG FLEXPEN 100 UNIT/ML  SOLN (INSULIN ASPART) 8 units prior to Breakfast 10 units prior to Evening Meal Supply one box of pens XALATAN 0.005 %  SOLN (LATANOPROST) 1 drop each eye at bedtime        Vital Signs:  Patient Profile:   60 Years Old Female Height:     61.5 inches (156.21 cm) Weight:      183.7 pounds BMI:     34.27 BP sitting:   135 / 78  (left arm)                 Diabetes Management History:      The patient is a 60 years old female who comes in for evaluation of DM Type 2.  She is (or has been) enrolled in the "Diabetic Education Program".  She states lack of understanding of dietary principles and is not following her diet appropriately.  She is checking home blood sugars.  She says that she is exercising.  Type of exercise includes: biking.  Duration of exercise is estimated to be 15 min.  She is doing this 3 times per week.        Other comments include: Walking 8 laps at gym on Mondays.  Will walk or ride bike at least 6 out of 7 days. [name]        Her home fasting blood sugars are as follows: highest: 205; lowest: 110; average: 140.       Current Allergies: DARVON (PROPOXYPHENE HCL) DIOVAN (VALSARTAN) FLAGYL (METRONIDAZOLE) METOPROLOL TARTRATE (METOPROLOL TARTRATE) NORVASC (AMLODIPINE BESYLATE) PRINZIDE (LISINOPRIL-HYDROCHLOROTHIAZIDE)        Impression & Recommendations:  Problem # 1:  DIABETES MELLITUS II, UNCOMPLICATED (ICD-250.00) Assessment: Unchanged Continued suboptimal control.  Similar readings to previous visit.  No major change in exercise regimen since last visit.  Willing to increase insulin to improve control.  Change Lantus to 26 units in the morning.  Change Novolog to 8 and 10 units prior to meals  in morning and evening.  Patient is willing to exercise 6 out of 7 days.  Will either walk or ride her exercise bike for exercise.  Patient is planning to go to [facility] Nutrition management for additional education.  Her updated medication list for this problem includes:    Lantus Solostar 100 Unit/ml Soln (Insulin glargine) .[name]... As directed 26 units each morning    Hyzaar 100-25 Mg Tabs (Losartan potassium-hctz) .[name]... 1 by mouth once daily    Metformin Hcl 1000 Mg Tabs (Metformin hcl) .[name]... Take 1.5  tablets by mouth in in the am and 1 tablets in the pm    Bayer Aspirin 325 Mg Tabs (Aspirin) .[name]... Take 1 tablet by mouth once a day    Novolog Flexpen 100 Unit/ml Soln (Insulin aspart) .[name]... 8 units prior to breakfast 10 units prior to evening meal supply one box of pens  Orders: Reassessment Each 15 min unit- [facility] (58099)   Complete Medication List: 1)  Lantus Solostar 100 Unit/ml Soln (Insulin glargine) .... As directed 26 units each morning 2)  Hyzaar 100-25 Mg Tabs (Losartan potassium-hctz) .[name].. 1 by mouth once daily 3)  Metoprolol Succinate 25 Mg Tb24 (Metoprolol succinate) .... Take one tab  two times a  day 4)  Clonidine Hcl 0.1 Mg Tabs (Clonidine hcl) .[name].. 1 tablet by mouth twice a day 5)  Crestor 20 Mg Tabs (Rosuvastatin calcium) .... Take 1 tablet by mouth once a day 6)  Lasix 20 Mg Tabs (Furosemide) .... Take 1 tablet by mouth twice a day 7)  Metformin Hcl 1000 Mg Tabs (Metformin hcl) .... Take 1.5  tablets by mouth in in the am and 1 tablets in the pm 8)  Nexium 40 Mg Cpdr (Esomeprazole magnesium) .[name].. 1 capsule by mouth twice a day 9)  Reglan 10 Mg Tabs (Metoclopramide hcl) .... 1/2 three times a day with meals and 1 at bedtime 10)  Singulair 10 Mg Tabs (Montelukast sodium) .... Take 1 tablet by mouth once a day 11)  Sucralfate 1 Gm Tabs (Sucralfate) .... Take 1 tablet by mouth four times a day 12)  Nitroglycerin 0.4 Mg Subl (Nitroglycerin) .... Place 1 tablet under tongue as  directed 13)  Acyclovir 800 Mg Tabs (Acyclovir) .... Take 1 tablet daily 14)  Albuterol Sulfate 1.25 Mg/65ml Nebu (Albuterol sulfate) .... Inhale 1 vial as directed twice a day 15)  Bayer Aspirin 325 Mg Tabs (Aspirin) .... Take 1 tablet by mouth once a day 16)  Proventil Hfa 108 (90 Base) Mcg/act Aers (Albuterol sulfate) .... Inhale 2 puff using inhaler every four hours 17)  Flexfot Cpap Mask 407  18)  Lancets For Glucometer Testing  19)  Novolog Flexpen 100 Unit/ml Soln (Insulin aspart) .... 8 units prior to breakfast 10 units prior to evening meal supply one box of pens 20)  Potassium Chloride 20 Meq Pack (Potassium chloride) .... One daily 21)  Zyrtec Allergy 10 Mg Tabs (Cetirizine hcl) .[name].. 1 by mouth at bedtime 22)  Qvar 80 Mcg/act Aers (Beclomethasone dipropionate) .... 2 puffs twice daily 23)  Xalatan 0.005 % Soln (Latanoprost) .[name].. 1 drop each eye at bedtime  Diabetes Management Assessment/Plan:      The following lipid goals have been established for the patient: Total cholesterol goal of 200; LDL cholesterol goal of 100; HDL cholesterol goal of 40; Triglyceride goal of 200.     Patient Instructions: 1)  Please schedule a follow-up appointment in 1 month in Rx clinic. 2)  It is important that you exercise.  Please exercise on your bike or walk at least "8 laps" on 6 out of 7 days per week.  3)  Check your blood sugars each morning and in the evening prior to dinner 3 or more nights per week. If your readings are usually above 200 please write down why you reading is high.  4)  Increase Lantus to 26 units each morning.  5)  Increase Novolog 8 units prior to breakfast and 10 units prior to evening meal.    Prescriptions: XALATAN 0.005 %  SOLN (LATANOPROST) 1 drop each eye at bedtime  #1 x 0   Entered and Authorized by:   Madelon Lips PHARMD   Signed by:   Madelon Lips PHARMD on 10/15/2007   Method used:   Historical   RxID:   [id number] METFORMIN HCL 1000 MG TABS (METFORMIN  HCL) Take 1.5  tablets by mouth in in the AM and 1 tablets in the PM  #1 x 0   Entered and Authorized by:   Madelon Lips PHARMD   Signed by:   Madelon Lips PHARMD on 10/15/2007   Method used:   Historical   RxID:   [id number]  ]

## 2010-10-02 NOTE — Assessment & Plan Note (Signed)
Summary: F/U Diabetes  Medications Added LANTUS SOLOSTAR 100 UNIT/ML  SOLN (INSULIN GLARGINE) as directed 28 units each morning NOVOLOG FLEXPEN 100 UNIT/ML  SOLN (INSULIN ASPART) 10 units prior to Breakfast 10 units prior to Evening Meal Supply one box of pens        Vital Signs:  Patient Profile:   60 Years Old Female Height:     61.5 inches (156.21 cm) Weight:      180 pounds BMI:     33.58 Pulse rate:   67 / minute BP sitting:   120 / 68  (left arm)                 Diabetes Management History:      The patient is a 60 years old female who comes in for evaluation of DM Type 2.  She is (or has been) enrolled in the "Diabetic Education Program".  She is checking home blood sugars.  She says that she is exercising.  Type of exercise includes: biking.  Duration of exercise is estimated to be 15 min.  She is doing this 3 times per week.        Hypoglycemic symptoms are not occurring.        Her home fasting blood sugars are as follows: average: 135.  Her 4:00 PM blood sugars reveal: average: 140.    Walking at the gym (10 laps) once per week.     Current Allergies: DARVON (PROPOXYPHENE HCL) DIOVAN (VALSARTAN) FLAGYL (METRONIDAZOLE) METOPROLOL TARTRATE (METOPROLOL TARTRATE) NORVASC (AMLODIPINE BESYLATE) PRINZIDE (LISINOPRIL-HYDROCHLOROTHIAZIDE)        Impression & Recommendations:  Problem # 1:  DIABETES MELLITUS II, UNCOMPLICATED (ICD-250.00) A1C evaluated at last visit was 7.5  Patient interested in improving glycemic control.  However, patient is willing to make only minimal changes in exercise regimen.  She was congratulated on weight loss of several pounds down to 180 pounds.  Increase Lantus from 26 to 28 units.  Increase mealtime Novolog to 10 units prior to both meals of the day.  She will attempt to lose 2 pounds over the next month.  Her updated medication list for this problem includes:    Lantus Solostar 100 Unit/ml Soln (Insulin glargine) .[name]... As directed  28 units each morning    Hyzaar 100-25 Mg Tabs (Losartan potassium-hctz) .[name]... 1 by mouth once daily    Metformin Hcl 1000 Mg Tabs (Metformin hcl) .[name]... Take 1.5  tablets by mouth in in the am and 1 tablets in the pm    Bayer Aspirin 325 Mg Tabs (Aspirin) .[name]... Take 1 tablet by mouth once a day    Novolog Flexpen 100 Unit/ml Soln (Insulin aspart) .Marland KitchenMarland KitchenMarland KitchenMarland Kitchen 10 units prior to breakfast 10 units prior to evening meal supply one box of pens   Problem # 2:  HYPERTENSION, BENIGN SYSTEMIC (ICD-401.1) Home readings on home BP cuff appear to be running 140/80-90  However, in office reading today was 120/68.   Patient appears to be adherent to therapy.  No change at this time.  Her updated medication list for this problem includes:    Hyzaar 100-25 Mg Tabs (Losartan potassium-hctz) .[name]... 1 by mouth once daily    Metoprolol Succinate 25 Mg Tb24 (Metoprolol succinate) .[name]... Take one tab  two times a day    Clonidine Hcl 0.1 Mg Tabs (Clonidine hcl) .[name]... 1 tablet by mouth twice a day    Lasix 20 Mg Tabs (Furosemide) .[name]... Take 1 tablet by mouth twice a day   Problem #  3:  HYPERNATREMIA (ICD-276.0) Repeat BMET obtained today for elevated sodium of 154 on last evaluation.    Complete Medication List: 1)  Lantus Solostar 100 Unit/ml Soln (Insulin glargine) .... As directed 28 units each morning 2)  Hyzaar 100-25 Mg Tabs (Losartan potassium-hctz) .[name].. 1 by mouth once daily 3)  Metoprolol Succinate 25 Mg Tb24 (Metoprolol succinate) .... Take one tab  two times a day 4)  Clonidine Hcl 0.1 Mg Tabs (Clonidine hcl) .[name].. 1 tablet by mouth twice a day 5)  Crestor 20 Mg Tabs (Rosuvastatin calcium) .... Take 1 tablet by mouth once a day 6)  Lasix 20 Mg Tabs (Furosemide) .... Take 1 tablet by mouth twice a day 7)  Metformin Hcl 1000 Mg Tabs (Metformin hcl) .... Take 1.5  tablets by mouth in in the am and 1 tablets in the pm 8)  Nexium 40 Mg Cpdr (Esomeprazole magnesium) .[name].. 1 capsule by mouth twice a day 9)  Singulair  10 Mg Tabs (Montelukast sodium) .... Take 1 tablet by mouth once a day 10)  Sucralfate 1 Gm Tabs (Sucralfate) .... Take 1 tablet by mouth four times a day 11)  Nitroglycerin 0.4 Mg Subl (Nitroglycerin) .... Place 1 tablet under tongue as directed 12)  Acyclovir 800 Mg Tabs (Acyclovir) .... Take 1 tabletb id/qd as directed 13)  Albuterol Sulfate 1.25 Mg/69ml Nebu (Albuterol sulfate) .... Inhale 1 vial as directed twice a day 14)  Bayer Aspirin 325 Mg Tabs (Aspirin) .... Take 1 tablet by mouth once a day 15)  Proventil Hfa 108 (90 Base) Mcg/act Aers (Albuterol sulfate) .... Inhale 2 puff using inhaler every four hours 16)  Flexfot Cpap Mask 407  17)  Lancets For Glucometer Testing  18)  Novolog Flexpen 100 Unit/ml Soln (Insulin aspart) .[name].. 10 units prior to breakfast 10 units prior to evening meal supply one box of pens 19)  Potassium Chloride 20 Meq Pack (Potassium chloride) .... One daily 20)  Zyrtec Allergy 10 Mg Tabs (Cetirizine hcl) .[name].. 1 by mouth at bedtime 21)  Qvar 80 Mcg/act Aers (Beclomethasone dipropionate) .... 2 puffs twice daily 22)  Xalatan 0.005 % Soln (Latanoprost) .[name].. 1 drop each eye at bedtime  Diabetes Management Assessment/Plan:      The following lipid goals have been established for the patient: Total cholesterol goal of 200; LDL cholesterol goal of 100; HDL cholesterol goal of 40; Triglyceride goal of 200.     Patient Instructions: 1)  Keep up the good work on weight loss.  2)  Increase Lantus to 28 units each morning. 3)  Increase Novology to 10 units prior to both meals daily. 4)  Exercise more if possible.  5)  Eat better healthier and less if possible.      Prescriptions: LANTUS SOLOSTAR 100 UNIT/ML  SOLN (INSULIN GLARGINE) as directed 28 units each morning  #1 x 0   Entered and Authorized by:   Madelon Lips PHARMD   Signed by:   Madelon Lips PHARMD on 11/12/2007   Method used:   Historical   RxID:   [id number]  ]  Appended Document: Orders Update      Clinical Lists Changes  Orders: Added new Test order of Reassessment Each 15 min unit- [facility] [phone]) - Signed

## 2010-10-02 NOTE — Assessment & Plan Note (Signed)
Summary: FLU SHOT/[facility]  Nurse Visit   Allergies: 1)  Darvon 2)  Diovan (Valsartan) 3)  Flagyl (Metronidazole) 4)  Norvasc (Amlodipine Besylate) 5)  Prinzide  Orders Added: 1)  Admin 1st Vaccine [90471] 2)  Flu Vaccine 37yrs + [91478] Flu Vaccine Consent Questions     Do you have a history of severe allergic reactions to this vaccine? no    Any prior history of allergic reactions to egg and/or gelatin? no    Do you have a sensitivity to the preservative Thimersol? no    Do you have a past history of Guillan-Barre Syndrome? no    Do you currently have an acute febrile illness? no    Have you ever had a severe reaction to latex? no    Vaccine information given and explained to patient? yes    Are you currently pregnant? no    Lot Number:[id number]   Exp Date:03/02/2011   Site Given  Left Deltoid IMu  Clarise Cruz [facility])  June 05, 2010 11:42 AM

## 2010-10-02 NOTE — Consult Note (Signed)
Summary: [facility]   Imported By: De Nurse 08/28/2009 15:05:23  _____________________________________________________________________  External Attachment:    Type:   Image     Comment:   External Document

## 2010-10-02 NOTE — Assessment & Plan Note (Signed)
Summary: F/U VISIT/[facility]   Vital Signs:  Patient profile:   60 year old female Weight:      188.2 pounds Temp:     98.1 degrees F oral Pulse rate:   70 / minute BP sitting:   110 / 72  (left arm)  Vitals Entered By: Alphia Kava (November 16, 2008 10:53 AM)  Is Patient Diabetic? Yes   History of Present Illness: Diabetes follow up with blood sugars in    good control,   no episodes of low blood sugar. Taking medicines regularly and having no problems with them.  Follow up hypertension. Taking medicines regularly with no problems. Not having any any headaches or chest pains.  Allergic rhinitis/cough: cough is stable. 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 She tried stopping the singulair and her allergies, post nasal drip gort  a LOT worse so whe went backon it.  needs a letter to [facility] as they are expecting her to walk 2-3 blocks to the bus stop rather than picking her up[ at door. She cannot see well enought to walk that and she gets winded after about 1/2 a block and her coughing spasms become uncontrollable.  Follow-up hyperlipidemia. Trying to follow a good diet, taking medicines regularly. Not having any problems with medicines, no myalgias and no fatigue.   Habits & Providers     Tobacco Status: never  Allergies: 1)  Darvon (Propoxyphene Hcl) 2)  Diovan (Valsartan) 3)  Flagyl (Metronidazole) 4)  Norvasc (Amlodipine Besylate) 5)  Prinzide (Lisinopril-Hydrochlorothiazide)  Social History:    Smoking Status:  never  Physical Exam  General:  alert and well-developed.   Eyes:  legally blind Neck:  supple, full ROM, and no masses.   Lungs:  normal respiratory effort and normal breath sounds.   Heart:  normal rate, regular rhythm, and no murmur.   Abdomen:  soft and non-tender.   Msk:  normal ROM and no joint tenderness.   Neurologic:  alert & oriented X3.     Impression & Recommendations:  Problem # 1:   DIABETES MELLITUS II, UNCOMPLICATED (ICD-250.00) Assessment Unchanged  Her updated medication list for this problem includes:    Novolog Flexpen 100 Unit/ml Soln (Insulin aspart) .[name]... 12 units prior to breakfast 12 units prior to evening meal supply one box of pens. dispense one or three months supply as patient wishes    Metformin Hcl 1000 Mg Tabs (Metformin hcl) .[name]... Take 1.5  tablets by mouth in in the am and 1 tablets in the pm    Lantus Solostar 100 Unit/ml Soln (Insulin glargine) .[name]... As directed 34 units each morning disp 1 month or can dispense three months as patient wishes    Hyzaar 100-25 Mg Tabs (Losartan potassium-hctz) .[name]... 1 by mouth once daily    Bayer Aspirin 325 Mg Tabs (Aspirin) .[name]... Take 1 tablet by mouth once a day  Orders: [facility]- Est  Level 4 (04540)  Problem # 2:  HYPERTENSION, BENIGN SYSTEMIC (ICD-401.1) Assessment: Unchanged  Her updated medication list for this problem includes:    Hyzaar 100-25 Mg Tabs (Losartan potassium-hctz) .[name]... 1 by mouth once daily    Lopressor 50 Mg Tabs (Metoprolol tartrate) .[name]..[name] Two times a day    Clonidine Hcl 0.1 Mg Tabs (Clonidine hcl) .[name]... 1 tablet by mouth twice a day    Lasix 20 Mg Tabs (Furosemide) .[name]... Take 1 tablet by mouth twice a day  Orders:  [facility]- Est  Level 4 (16109)  Problem # 3:  COUGH (ICD-786.2) Assessment: Unchanged  we discussed. continue singulair  Orders: [facility]- Est  Level 4 (99214)  Problem # 4:  HYPERCHOLESTEROLEMIA (ICD-272.0) Assessment: Unchanged  Her updated medication list for this problem includes:    Simvastatin 40 Mg Tabs (Simvastatin) .[name]... 1 by mouth once daily  Complete Medication List: 1)  Novolog Flexpen 100 Unit/ml Soln (Insulin aspart) .[name].. 12 units prior to breakfast 12 units prior to evening meal supply one box of pens. dispense one or three months supply as patient wishes 2)  Metformin Hcl 1000 Mg Tabs (Metformin hcl) .... Take 1.5  tablets by mouth in in the am and 1 tablets in  the pm 3)  Lantus Solostar 100 Unit/ml Soln (Insulin glargine) .... As directed 34 units each morning disp 1 month or can dispense three months as patient wishes 4)  Potassium Chloride Crys Cr 20 Meq Cr-tabs (Potassium chloride crys cr) .[name].. 1 by mouth once daily please dispense capsules if available 5)  Hyzaar 100-25 Mg Tabs (Losartan potassium-hctz) .[name].. 1 by mouth once daily 6)  Lopressor 50 Mg Tabs (Metoprolol tartrate) .... Two times a day 7)  Clonidine Hcl 0.1 Mg Tabs (Clonidine hcl) .[name].. 1 tablet by mouth twice a day 8)  Lasix 20 Mg Tabs (Furosemide) .... Take 1 tablet by mouth twice a day 9)  Simvastatin 40 Mg Tabs (Simvastatin) .[name].. 1 by mouth once daily 10)  Nexium 40 Mg Cpdr (Esomeprazole magnesium) .[name].. 1 capsule by mouth twice a day 11)  Acyclovir 800 Mg Tabs (Acyclovir) .... Take 1 tabletb id/qd as directed 12)  Albuterol Sulfate 1.25 Mg/74ml Nebu (Albuterol sulfate) .... Inhale 1 vial as directed twice a day 13)  Bayer Aspirin 325 Mg Tabs (Aspirin) .... Take 1 tablet by mouth once a day 14)  Flexfot Cpap Mask 407  15)  Lancets For Glucometer Testing  16)  Zyrtec Allergy 10 Mg Tabs (Cetirizine hcl) .[name].. 1 by mouth at bedtime 17)  Qvar 80 Mcg/act Aers (Beclomethasone dipropionate) .... 2 puffs twice daily 18)  Xalatan 0.005 % Soln (Latanoprost) .[name].. 1 drop each eye at bedtime 19)  Cpap 12 Advanced  20)  Allergy Vaccine Gh 1:50  21)  Allergy Vaccine Gh Advance To 1:10 Next Order  .... Build per protocol 22)  Trusopt 2 % Soln (Dorzolamide hcl) .[name].. 1 drop in left eye twice daily 23)  Astepro 137 Mcg/spray Soln (Azelastine hcl) .... 2 sprays daily as needed 24)  Singulair 10 Mg Tabs (Montelukast sodium) .... On hold 09/2008

## 2010-10-02 NOTE — Assessment & Plan Note (Signed)
Summary: FU 2 MONTHS///KP   Visit Type:  Follow-up PCP:  Denny Levy MD  Chief Complaint:  follow up.  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 of ASTHMA (ICD-493.90) IRRITABLE BOWEL SYNDROME (ICD-564.1) HYPERCHOLESTEROLEMIA (ICD-272.0) GLAUCOMA (ICD-365.9) DEPRESSION, MAJOR, RECURRENT (ICD-296.30) CATARACT EXTRACTION STATUS (ICD-V45.61)  Mrs. Loudin returns for follow-up saying that she is breathing very well today. She had referred her self for allergy evaluation related to her chronic cough, and I had skin tested in February.  She had chosen to try allergy vaccine.  Three days ago she had head congestion and cough after having been outdoors in the wind a lot.  She denies fever.  Cough has been nonproductive, but she does notice a little wheeze.  She is building up her allergy vaccine with no problems. Dr. Vassie Loll remains her pulmonologist, and she is followed at the [facility] for primary care. She has sleep apnea being managed through the family practice Center, but she doesn't like the CPAP, which gives her headache.  She says she can't use it every day.  She doesn't know her pressure.  She asks if I would contact [facility].  She thinks eating beef makes her cough.  We discussed her paralyzed vocal cord with risk that she will aspirate more easily.  She has to be  very deliberate about chewing and swallowing, and maintain reflux precautions.       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 of ASTHMA (ICD-493.90)       IRRITABLE BOWEL SYNDROME (ICD-564.1)       HYPERCHOLESTEROLEMIA (ICD-272.0)       GLAUCOMA (ICD-365.9)       DEPRESSION, MAJOR, RECURRENT (ICD-296.30)       CATARACT EXTRACTION STATUS (ICD-V45.61)                       Past Surgical History:    Reviewed history from 10/28/2007 and no changes required:       Anal fistula repair -,        Arthroscopy--R shoulder/R knee 12/01 - 10/07/2000,        breast reduction - 04/21/2003,        BTL--`87 -,        cardiac Cath--nl ef, no cad - 10/04/2003, echo--tricus regurg, mild pul htn, ef 60 - 03/02/2004,        ENT Naso-pharyn scope-normal - 09/11/2006,        Hysterectomy - 10/07/2000, l       . rotator cuff surgery - 10/18/2003,        T-score: -.0.54 (low risk  currently) - 09/02/2004       bronchoscopy for chronic cough 07/2007   Family History:    Reviewed history from 10/30/2006 and no changes required:       Father-, financial difficulties--receives meds from [facility] [phone]), Mohter-diabetes, no family hx colon ca  Social History:    Reviewed history from 10/30/2006 and no changes required:       Lives with teenage son (h/o mental dz).  . Recently approved for section 8 housing (12/03)       Patient states former smoker.    Risk Factors:  Tobacco use:  quit   Review of Systems      See HPI   Vital Signs:  Patient Profile:   60 Years Old Female Height:     61.5 inches (156.21 cm) BP sitting:   102 / 52  (left arm) Cuff size:   large             Is Patient  Diabetic? Yes  Comments Medications reviewed with patient ..................................................................Marland KitchenDarra Lis RMA  December 14, 2007 1:43 PM      Physical Exam  Eyes:     decreased eyesight     Impression & Recommendations:  Problem # 1:  ALLERGIC  RHINITIS (ICD-477.9) Assessment: Unchanged she will continue building allergy vaccine administered at this office.  I talked over risk and benefit considerations again.  I also emphasized that allergy is likely to be only a partial explanation for her cough.  Her paralyzed vocal cord remains the primary risk factor.  She will continue pulmonary follow-up with Dr. Vassie Loll, and primary care follow-up at the family practice center as before. Her updated medication list for this problem includes:    Zyrtec Allergy 10 Mg Tabs (Cetirizine hcl) .[name]... 1 by mouth at bedtime  Orders: Est. Patient Level III (11914)   Problem # 2:  OBSTRUCTIVE SLEEP APNEA (ICD-327.23) Says mask uncomfortable- gives headache. Will ask [facility] to query [facility] on settings. Perhaps Dr.Alva can assist her primary physician with this. Orders: DME Referral (DME)   Medications Added to Medication List This Visit: 1)  Allergy Vaccine Gh Build-up    Patient Instructions: 1)  Please schedule a follow-up appointment in 3 months. 2)  Continue allergy vaccine build-up 3)  I will try to find cpap information from [facility] so  we can advise you on any changes. 4)  Ok to try off and on Zyrtec and Singulair while we try to see what works    ]

## 2010-10-02 NOTE — Progress Notes (Signed)
  Phone Note Outgoing Call   Summary of Call: DEAR [facility] [employer]AM can you call in her new rx for gabapentin as below Thanks!  Denny Levy MD  July 26, 2009 2:54 PM   Follow-up for Phone Call        R x called in.  LMOVM informing pt. Follow-up by: Jone Baseman CMA,  July 26, 2009 4:40 PM    Prescriptions: GABAPENTIN 300 MG CAPS (GABAPENTIN) 1 by mouth bid  #60 x 1   Entered and Authorized by:   Denny Levy MD   Signed by:   Denny Levy MD on 07/26/2009   Method used:   Telephoned to ...       [facility] (retail)       [address]       [place], [place]  [address]       Ph: [phone]       Fax: [phone]   RxID:   [id number]

## 2010-10-02 NOTE — Progress Notes (Signed)
Summary: phn msg  Phone Note Call from Patient Call back at [facility] Phone [phone]   Caller: Patient Summary of Call: Dr. Lowella Grip to have surgery on her hand and they need clearance from [place] stating that she can have this.  Their phone number is [phone]. Initial call taken by: Clydell Hakim,  February 06, 2010 3:14 PM  Follow-up for Phone Call        Triage I am routing letter to you--please let her know it is ready and she can either pick it up (unlikely) or we can fax to the surgeons office Thanks!  Denny Levy MD  February 06, 2010 3:50 PM   Additional Follow-up for Phone Call Additional follow up Details #1::        states she will pick it up friday. placed it up front Additional Follow-up by: Golden Circle RN,  February 06, 2010 3:55 PM    forward to pcp.Loralee Pacas CMA  February 06, 2010 3:18 PM

## 2010-10-02 NOTE — Assessment & Plan Note (Signed)
Summary: Rx clinic: DM follow up   Vital Signs:  Patient Profile:   60 Years Old Female Height:     61.5 inches (156.21 cm) Weight:      181.7 pounds BMI:     33.90 Pulse rate:   76 / minute BP sitting:   91 / 58  (right arm)                 PCP:  Denny Levy MD  Chief Complaint:  Diabetes follow up.  History of Present Illness: 60 yo AAF presents for DM follow up.  She has been doing well.  She is finishing a course of prednisone for a cough.  Her blood sugars were slightly elevated with this but not too bad.  Her 30 day average is 134.  She states she has missed the Novolog occasionally.  Diabetes Management History:      The patient is a 60 years old female who comes in for evaluation of DM Type 2.  She is (or has been) enrolled in the "Diabetic Education Program".  She states understanding of dietary principles but she is not following the appropriate diet.  No sensory loss is reported.  She is checking home blood sugars.  She says that she is exercising.  Type of exercise includes: biking.  Duration of exercise is estimated to be 15 min.  She is doing this 3 times per week.        Her home fasting blood sugars are as follows: highest: 166; lowest: 90; average: 134.        Prior Medication List:  LANTUS SOLOSTAR 100 UNIT/ML  SOLN (INSULIN GLARGINE) as directed 30 units each morning HYZAAR 100-25 MG  TABS (LOSARTAN POTASSIUM-HCTZ) 1 by mouth once daily METOPROLOL SUCCINATE 25 MG TB24 (METOPROLOL SUCCINATE) take one tab  two times a day CLONIDINE HCL 0.1 MG TABS (CLONIDINE HCL) 1 tablet by mouth twice a day CRESTOR 20 MG TABS (ROSUVASTATIN CALCIUM) Take 1 tablet by mouth once a day LASIX 20 MG TABS (FUROSEMIDE) Take 1 tablet by mouth twice a day METFORMIN HCL 1000 MG TABS (METFORMIN HCL) Take 1.5  tablets by mouth in in the AM and 1 tablets in the PM NEXIUM 40 MG CPDR (ESOMEPRAZOLE MAGNESIUM) 1 capsule by mouth twice a day SINGULAIR 10 MG TABS (MONTELUKAST SODIUM) Take 1  tablet by mouth once a day SUCRALFATE 1 GM TABS (SUCRALFATE) Take 1 tablet by mouth four times a day NITROGLYCERIN 0.4 MG SUBL (NITROGLYCERIN) Place 1 tablet under tongue as directed ACYCLOVIR 800 MG TABS (ACYCLOVIR) Take 1 tabletb id/qd as directed ALBUTEROL SULFATE 1.25 MG/3ML NEBU (ALBUTEROL SULFATE) Inhale 1 vial as directed twice a day BAYER ASPIRIN 325 MG TABS (ASPIRIN) Take 1 tablet by mouth once a day * FLEXFOT CPAP MASK 407  * LANCETS FOR GLUCOMETER TESTING  NOVOLOG FLEXPEN 100 UNIT/ML  SOLN (INSULIN ASPART) 12 units prior to Breakfast 12 units prior to Evening Meal Supply one box of pens ZYRTEC ALLERGY 10 MG  TABS (CETIRIZINE HCL) 1 by mouth at bedtime QVAR 80 MCG/ACT  AERS (BECLOMETHASONE DIPROPIONATE) 2 puffs twice daily XALATAN 0.005 %  SOLN (LATANOPROST) 1 drop each eye at bedtime * ALLERGY VACCINE GH BUILD-UP  PREDNISONE 10 MG  TABS (PREDNISONE) 4 tabs for 2 days, then 3 tabs for 2 days, 2 tabs for 2 days, then 1 tab for 2 days, then stop TESSALON 200 MG  CAPS (BENZONATATE) 1 by mouth three times a day as needed cough  Current Allergies: DARVON (PROPOXYPHENE HCL) DIOVAN (VALSARTAN) FLAGYL (METRONIDAZOLE) METOPROLOL TARTRATE (METOPROLOL TARTRATE) NORVASC (AMLODIPINE BESYLATE) PRINZIDE (LISINOPRIL-HYDROCHLOROTHIAZIDE)        Impression & Recommendations:  Problem # 1:  DIABETES MELLITUS II, UNCOMPLICATED (ICD-250.00) Assessment: Unchanged DM under ok control.  Not at goal of morning BS averaging 100.  Her average is 134 mg/dl.  Will increase Lantus 32 units in the morning.  Continue Novolog 12 units with meals and the metformin.  Follow up in 4 to 6 weeks.  Her updated medication list for this problem includes:    Lantus Solostar 100 Unit/ml Soln (Insulin glargine) .[name]... As directed 32 units each morning    Hyzaar 100-25 Mg Tabs (Losartan potassium-hctz) .[name]... 1 by mouth once daily    Metformin Hcl 1000 Mg Tabs (Metformin hcl) .[name]... Take 1.5  tablets by mouth in  in the am and 1 tablets in the pm    Bayer Aspirin 325 Mg Tabs (Aspirin) .[name]... Take 1 tablet by mouth once a day    Novolog Flexpen 100 Unit/ml Soln (Insulin aspart) .[name]... 12 units prior to breakfast 12 units prior to evening meal supply one box of pens  Orders: Reassessment Each 15 min unit- [facility] (69485)   Problem # 2:  HYPERTENSION, BENIGN SYSTEMIC (ICD-401.1) Assessment: Unchanged Well controlled at goal of less than 130/80.  Her home readings are at goal but higher than her reading at clinic.  Will continue to monitor symptoms and BP readings to see if a decrease in medication is needed.   Her updated medication list for this problem includes:    Hyzaar 100-25 Mg Tabs (Losartan potassium-hctz) .[name]... 1 by mouth once daily    Metoprolol Succinate 25 Mg Tb24 (Metoprolol succinate) .[name]... Take one tab  two times a day    Clonidine Hcl 0.1 Mg Tabs (Clonidine hcl) .[name]... 1 tablet by mouth twice a day    Lasix 20 Mg Tabs (Furosemide) .[name]... Take 1 tablet by mouth twice a day  [facility]   Complete Medication List: 1)  Lantus Solostar 100 Unit/ml Soln (Insulin glargine) .... As directed 32 units each morning 2)  Hyzaar 100-25 Mg Tabs (Losartan potassium-hctz) .[name].. 1 by mouth once daily 3)  Metoprolol Succinate 25 Mg Tb24 (Metoprolol succinate) .... Take one tab  two times a day 4)  Clonidine Hcl 0.1 Mg Tabs (Clonidine hcl) .[name].. 1 tablet by mouth twice a day 5)  Crestor 20 Mg Tabs (Rosuvastatin calcium) .... Take 1 tablet by mouth once a day 6)  Lasix 20 Mg Tabs (Furosemide) .... Take 1 tablet by mouth twice a day 7)  Metformin Hcl 1000 Mg Tabs (Metformin hcl) .... Take 1.5  tablets by mouth in in the am and 1 tablets in the pm 8)  Nexium 40 Mg Cpdr (Esomeprazole magnesium) .[name].. 1 capsule by mouth twice a day 9)  Singulair 10 Mg Tabs (Montelukast sodium) .... Take 1 tablet by mouth once a day 10)  Sucralfate 1 Gm Tabs (Sucralfate) .... Take 1 tablet by  mouth four times a day 11)  Nitroglycerin 0.4 Mg Subl (Nitroglycerin) .... Place 1 tablet under tongue as directed 12)  Acyclovir 800 Mg Tabs (Acyclovir) .... Take 1 tabletb id/qd as directed 13)  Albuterol Sulfate 1.25 Mg/25ml Nebu (Albuterol sulfate) .... Inhale 1 vial as directed twice a day 14)  Bayer Aspirin 325 Mg Tabs (Aspirin) .... Take 1 tablet by mouth once a day 15)  Flexfot Cpap Mask 407  16)  Lancets For Glucometer Testing  17)  Novolog Flexpen 100 Unit/ml Soln (Insulin aspart) .[name].. 12 units prior to breakfast 12 units prior to evening meal supply one box of pens 18)  Zyrtec Allergy 10 Mg Tabs (Cetirizine hcl) .[name].. 1 by mouth at bedtime 19)  Qvar 80 Mcg/act Aers (Beclomethasone dipropionate) .... 2 puffs twice daily 20)  Xalatan 0.005 % Soln (Latanoprost) .[name].. 1 drop each eye at bedtime 21)  Allergy Vaccine Gh Build-up  22)  Prednisone 10 Mg Tabs (Prednisone) .... 4 tabs for 2 days, then 3 tabs for 2 days, 2 tabs for 2 days, then 1 tab for 2 days, then stop 23)  Tessalon 200 Mg Caps (Benzonatate) .[name].. 1 by mouth three times a day as needed cough  Diabetes Management Assessment/Plan:      The following lipid goals have been established for the patient: Total cholesterol goal of 200; LDL cholesterol goal of 100; HDL cholesterol goal of 40; Triglyceride goal of 200.    Mammogram Screening:    Last Mammogram:  04/13/2007  Osteoporosis Risk Assessment:  Risk Factors for Fracture or Low Bone Density:   Smoking status:       quit  Immunization & Chemoprophylaxis:    Tetanus vaccine: Done.  (10/31/1996)    Influenza vaccine: Fluvax 3+  (05/25/2007)    Pneumovax: Done.  (06/02/1993)   Patient Instructions: 1)  Please schedule a follow-up appointment in 4 to 6 weeks in pharmacy clinic. 2)  Check your blood sugars regularly. If your readings are usually above 300 or below 70 you should contact our office. 3)  Increase your lantus to 32 units a day. 4)  Continue Novolog 12 units  with each meal. 5)  Continue all other medications. 6)  Continue to monitor your blood pressure.  Let us know if it is running low or if you feel dizzy. 7)  Follow up with Dr. Jennette Kettle as directed.    ]

## 2010-10-02 NOTE — Letter (Signed)
Summary: LAB Letter  [facility]  [address]   [place], [place] [address]   Phone: [phone]  Fax: [phone]    11/16/2007  [name] [address] [name], [place]  [address]  Dear [name],  Your kidney function, electrolytes and other labs were NORMAL. Great!         Sincerely,   Denny Levy MD [facility]  Appended Document: LAB Letter letter sent via mail to pt ..................Marland KitchenDelores [name], CMA (AAMA)

## 2010-10-02 NOTE — Progress Notes (Signed)
Summary: status of order   Phone Note From Pharmacy Call back at [phone]    Caller: [facility] Summary of Call: checking status of order for pts diabetic testing supplies Initial call taken by: ERIN LEVAN,  July 27, 2007 3:07 PM  Follow-up for Phone Call        spoke with the supply company. they will re-fax Follow-up by: Golden Circle RN,  July 28, 2007 12:11 PM  Additional Follow-up for Phone Call Additional follow up Details #1::        fax rec'd & placed in md's chart box Additional Follow-up by: Golden Circle RN,  July 28, 2007 1:41 PM    Additional Follow-up for Phone Call Additional follow up Details #2::    has been done and in stack to fax plz let her know   ...................................................................Huntley Dec NEAL MD  August 03, 2007 11:09 AM   Additional Follow-up for Phone Call Additional follow up Details #3:: Details for Additional Follow-up Action Taken: faxed as requested. Additional Follow-up by: Theresia Lo RN,  August 03, 2007 11:39 AM

## 2010-10-02 NOTE — Medication Information (Signed)
Summary: [facility]  [facility]   Imported By: Lester Basin 04/20/2009 09:37:41  _____________________________________________________________________  External Attachment:    Type:   Image     Comment:   External Document

## 2010-10-02 NOTE — Progress Notes (Signed)
Summary: status of rx   Phone Note Call from Patient Call back at Home Phone [phone]   Reason for Call: Talk to Nurse Summary of Call: pt is calling checking the status of an rx that was suppose to be sent [facility] needles Initial call taken by: ERIN LEVAN,  September 17, 2007 2:58 PM  Follow-up for Phone Call        LMOVM that if pt is still only using three needles a day, then 100 needles should be enough, and to call back if she is using more Follow-up by: Carmelina Balducci LPN,  September 18, 2007 2:28 PM  Additional Follow-up for Phone Call Additional follow up Details #1::        pt is calling back, she sts 3 times a day is enough Additional Follow-up by: ERIN LEVAN,  September 18, 2007 4:04 PM    Additional Follow-up for Phone Call Additional follow up Details #2::    Pt is checking status. Follow-up by: Haydee Salter,  September 22, 2007 9:20 AM  Additional Follow-up for Phone Call Additional follow up Details #3:: Details for Additional Follow-up Action Taken: Pt states she need a new Rx written for her needles showing that she uses 3 a day, her pharmacy has her only using one a day.  Additional Follow-up by: Chemere Steffler LPN,  September 22, 2007 9:34 AM  OK please CALL her pharmacy and see what the issue is.She should get 100 needles a month for using 3 a day. as needed refills Plz call in. I do not know what type of needle she uses.  ...................................................................Huntley Dec NEAL MD  September 22, 2007 3:05 PM  Called pharmacy and cleared up issue with needles, pharmacy now has her using 3 neeedles a day....................................................................Jocob Dambach LPN  September 23, 2007 8:37 AM

## 2010-10-02 NOTE — Assessment & Plan Note (Signed)
Summary: burning in legs,tcb   Vital Signs:  Patient profile:   60 year old female Weight:      179 pounds Pulse rate:   80 / minute BP sitting:   130 / 80  (left arm) Cuff size:   regular  Vitals Entered By: Arlyss Repress CMA, (May 21, 2010 1:39 PM) CC: f/up ED. feels weak and burning sensation in feet and arms. Is Patient Diabetic? Yes Pain Assessment Patient in pain? yes     Location: feet/arms Intensity: 8 Type: burning Onset of pain  worse since Saturday   Primary Care Provider:  Denny Levy MD  CC:  f/up ED. feels weak and burning sensation in feet and arms..  History of Present Illness: 60 yo F, with PMHx DM - usually controlled with A1c 6-7, and known peripheral neuropathy, presents with CC: worsening PN:  1. PN: Bilateral feet to hips and hands to elbows, chronic, failed Neurontin 2/2 dizziness, states that PN has been gradually worsening "week by week," went to ED x 2 days ago because PN worsened acutely, couldn't sleep 2/2 pain. Denies HA, dizziness, rash, itching, lesions, joint pain, back pain, radiation of pain down legs, bowel/bladder incontinence, weakness. The pain is still bad, even when she is warm under a blanket. 12/2009 fairly WNL, MCV 91. Patient taking Zoloft (high dose), B vitamins. She was given Vicodin at ED with no relief. AM BG 171 - She admits that she is only taking insulin every other day 2/2 pain.   Current Medications (verified): 1)  Lantus Solostar 100 Unit/ml Soln (Insulin Glargine) .... 40units Subcutaneously Daily Disp Qs 1 M in Solostar Pen This Replaces 70-30 Mix 2)  Metformin Hcl 1000 Mg Tabs (Metformin Hcl) .... Take 1.5  Tablets By Mouth in in The Am and 1 Tablets in The Pm 3)  Potassium Chloride 40 Meq/50ml (20%) Liqd (Potassium Chloride) .[name].. 15 Ml Once Daily 4)  Hyzaar 100-25 Mg  Tabs (Losartan Potassium-Hctz) .[name].. 1 By Mouth Once Daily 5)  Metoprolol Succinate 50 Mg Xr24h-Tab (Metoprolol Succinate) .... Take One Tablet Two Times A  Day 6)  Lasix 20 Mg Tabs (Furosemide) .... Take 1 Tablet By Mouth Daily 7)  Simvastatin 40 Mg Tabs (Simvastatin) .[name].. 1 By Mouth Once Daily 8)  Nexium 40 Mg Cpdr (Esomeprazole Magnesium) .[name].. 1 Capsule By Mouth Twice A Day 9)  Acyclovir 800 Mg Tabs (Acyclovir) .... Take 1 Tabletb Id/qd As Directed 10)  Lancets For Glucometer Testing 11)  Brovana 15 Mcg/21ml Nebu (Arformoterol Tartrate) .[name].. 1 Neb Every Twice Daily If Needed 12)  Bayer Aspirin 325 Mg Tabs (Aspirin) .... Take 1 Tablet By Mouth Once A Day 13)  Flexfot Cpap Mask 407 14)  Zyrtec Allergy 10 Mg  Tabs (Cetirizine Hcl) .[name].. 1 By Mouth At Bedtime On Hold Per Dr Maple Hudson 15)  Qvar 80 Mcg/act  Aers (Beclomethasone Dipropionate) .... 2 Puffs Twice Daily 16)  Xalatan 0.005 %  Soln (Latanoprost) .[name].. 1 Drop Each Eye At Bedtime 17)  Cpap 12 Advanced 18)  Allergy Vaccine Gh 1:10 19)  Trusopt 2 % Soln (Dorzolamide Hcl) .[name].. 1 Drop in Left Eye Twice Daily 20)  Astepro 137 Mcg/spray Soln (Azelastine Hcl) .... 2 Sprays Daily As Needed 21)  Singulair 10 Mg Tabs (Montelukast Sodium) .... One By Mouth Qd 22)  Zoloft 100 Mg Tabs (Sertraline Hcl) .[name].. 1 1/2 Tabs By Mouth Qd 23)  Allopurinol 300 Mg Tabs (Allopurinol) .[name].. 1 By Mouth Qd 24)  Diabetic Shoes .... Patient With  Peripheral Neuropathy and Loss of Sensation in Feet, Some Callouses 25)  Fluconazole 100 Mg Tabs (Fluconazole) .[name].. 1 By Mouth Every Other Day For Three Doses 26)  Lyrica 50 Mg Caps (Pregabalin) .... One By Mouth Tid 27)  Nortriptyline Hcl 25 Mg Caps (Nortriptyline Hcl) .... One By Mouth Q Hs  Allergies (verified): 1)  Darvon 2)  Diovan (Valsartan) 3)  Flagyl (Metronidazole) 4)  Norvasc (Amlodipine Besylate) 5)  Prinzide PMH-FH-SH reviewed for relevance  Review of Systems      See HPI  Physical Exam  General:  Well-developed, well-nourished, in no acute distress; alert, appropriate and cooperative throughout examination. Vitals reviewed. Pulses:  R and L radial, dorsalis  pedis and posterior tibial pulses are full and equal bilaterally. Extremities:  No edema. Neurologic:  Strength normal in all extremities, RUE sensory loss, RLE sensory loss, LUE sensory loss, and LLE sensory loss. Marked reaction of pain with light touch of feet and hands. Skin:  Intact without suspicious lesions or rashes, left carpal tunnel scar. Psych:  Oriented X3 and flat affect.     Impression & Recommendations:  Problem # 1:  PERIPHERAL NEUROPATHY (ICD-356.9) Assessment Deteriorated NO RED FLAGs. Patient failed Neurontin. Vicodin has not helped pain, she is on B vitamin, A1c in past controlled - will check today. Other options for pain control include: SNRI - but patient is on high dose Zoloft, Nortriptyline - causes sedation, Lyrica - possibly expensive. Discussed these options with patient. Rx Lyrica. If patient is unable to fill it, she will try Nortriptyline q hs. Patient to follow up with PCP next week. Orders: [facility]- Est Level  3 (66063)  Problem # 2:  DIABETES MELLITUS II, UNCOMPLICATED (ICD-250.00) Assessment: Unchanged  Her updated medication list for this problem includes:    Lantus Solostar 100 Unit/ml Soln (Insulin glargine) .Marland KitchenMarland KitchenMarland KitchenMarland Kitchen 40units subcutaneously daily disp qs 1 m in solostar pen this replaces 70-30 mix    Metformin Hcl 1000 Mg Tabs (Metformin hcl) .[name]... Take 1.5  tablets by mouth in in the am and 1 tablets in the pm    Hyzaar 100-25 Mg Tabs (Losartan potassium-hctz) .[name]... 1 by mouth once daily    Bayer Aspirin 325 Mg Tabs (Aspirin) .[name]... Take 1 tablet by mouth once a day  Orders: A1C-[facility] (01601) [facility]- Est Level  3 (09323)  Complete Medication List: 1)  Lantus Solostar 100 Unit/ml Soln (Insulin glargine) .... 40units subcutaneously daily disp qs 1 m in solostar pen this replaces 70-30 mix 2)  Metformin Hcl 1000 Mg Tabs (Metformin hcl) .... Take 1.5  tablets by mouth in in the am and 1 tablets in the pm 3)  Potassium Chloride 40 Meq/67ml (20%) Liqd (Potassium  chloride) .[name].. 15 ml once daily 4)  Hyzaar 100-25 Mg Tabs (Losartan potassium-hctz) .[name].. 1 by mouth once daily 5)  Metoprolol Succinate 50 Mg Xr24h-tab (Metoprolol succinate) .... Take one tablet two times a day 6)  Lasix 20 Mg Tabs (Furosemide) .... Take 1 tablet by mouth daily 7)  Simvastatin 40 Mg Tabs (Simvastatin) .[name].. 1 by mouth once daily 8)  Nexium 40 Mg Cpdr (Esomeprazole magnesium) .[name].. 1 capsule by mouth twice a day 9)  Acyclovir 800 Mg Tabs (Acyclovir) .... Take 1 tabletb id/qd as directed 10)  Lancets For Glucometer Testing  11)  Brovana 15 Mcg/51ml Nebu (Arformoterol tartrate) .[name].. 1 neb every twice daily if needed 12)  Bayer Aspirin 325 Mg Tabs (Aspirin) .... Take 1 tablet by mouth once a day 13)  Flexfot Cpap  Mask 407  14)  Zyrtec Allergy 10 Mg Tabs (Cetirizine hcl) .[name].. 1 by mouth at bedtime on hold per dr young 15)  Qvar 80 Mcg/act Aers (Beclomethasone dipropionate) .... 2 puffs twice daily 16)  Xalatan 0.005 % Soln (Latanoprost) .[name].. 1 drop each eye at bedtime 17)  Cpap 12 Advanced  18)  Allergy Vaccine Gh 1:10  19)  Trusopt 2 % Soln (Dorzolamide hcl) .[name].. 1 drop in left eye twice daily 20)  Astepro 137 Mcg/spray Soln (Azelastine hcl) .... 2 sprays daily as needed 21)  Singulair 10 Mg Tabs (Montelukast sodium) .... One by mouth qd 22)  Zoloft 100 Mg Tabs (Sertraline hcl) .[name].. 1 1/2 tabs by mouth qd 23)  Allopurinol 300 Mg Tabs (Allopurinol) .[name].. 1 by mouth qd 24)  Diabetic Shoes  .... Patient with peripheral neuropathy and loss of sensation in feet, some callouses 25)  Fluconazole 100 Mg Tabs (Fluconazole) .[name].. 1 by mouth every other day for three doses 26)  Lyrica 50 Mg Caps (Pregabalin) .... One by mouth tid 27)  Nortriptyline Hcl 25 Mg Caps (Nortriptyline hcl) .... One by mouth q hs  Patient Instructions: 1)  It was nice to meet you today. 2)  I am prescribing a medication called Lyrica. It is a medication that treats nerve pain. If you feel dizzy, try taking 1/2 tab  three times a day.  3)  Follow up with Dr. Jennette Kettle next week. Prescriptions: NORTRIPTYLINE HCL 25 MG CAPS (NORTRIPTYLINE HCL) one by mouth q hs  #30 x 0   Entered and Authorized by:   Helane Rima DO   Signed by:   Helane Rima DO on 05/21/2010   Method used:   Print then Give to Patient   RxID:   [id number] LYRICA 50 MG CAPS (PREGABALIN) one by mouth tid  #30 x 0   Entered and Authorized by:   Helane Rima DO   Signed by:   Helane Rima DO on 05/21/2010   Method used:   Print then Give to Patient   RxID:   [id number]   Laboratory Results   Blood Tests   Date/Time Received: May 21, 2010 2:13 PM  Date/Time Reported: May 21, 2010 2:24 PM   HGBA1C: 7.3%   (Normal Range: Non-Diabetic - 3-6%   Control Diabetic - 6-8%)  Comments: ...............test performed by......Marland KitchenBonnie A. [place], MLS (ASCP)cm      Prevention & Chronic Care Immunizations   Influenza vaccine: Fluvax 3+  (06/02/2009)   Influenza vaccine deferral: Deferred  (05/21/2010)   Influenza vaccine due: 06/08/2009    Tetanus booster: 03/02/2008: given   Tetanus booster due: 03/02/2018    Pneumococcal vaccine: Done.  (06/02/1993)   Pneumococcal vaccine due: None  Colorectal Screening   Hemoccult: not indicated  (06/09/2008)   Hemoccult due: Not Indicated    Colonoscopy: Done.  (10/31/2005)   Colonoscopy due: 11/01/2015  Other Screening   Pap smear: Not documented   Pap smear due: Not Indicated    Mammogram: ASSESSMENT: Benign - BI-RADS 2^MM DIGITAL SCREENING  (04/18/2010)   Mammogram due: 04/14/2010   Smoking status: never  (03/14/2010)  Diabetes Mellitus   HgbA1C: 7.3  (05/21/2010)   HgbA1C action/deferral: Ordered  (06/28/2009)   Hemoglobin A1C due: 07/29/2008    Eye exam: Normal  (09/04/2009)    Foot exam: yes  (02/09/2010)   High risk foot: Not documented   Foot care education: Not documented    Urine microalbumin/creatinine ratio: Not documented   Urine  microalbumin action/deferral:  Not indicated    Diabetes flowsheet reviewed?: Yes   Progress toward A1C goal: Unchanged  Lipids   Total Cholesterol: 93  (01/04/2009)   LDL: 47  (01/04/2009)   LDL Direct: 55  (07/02/2007)   HDL: 30  (01/04/2009)   Triglycerides: 81  (01/04/2009)    SGOT (AST): 64  (12/28/2009)   SGPT (ALT): 54  (12/28/2009)   Alkaline phosphatase: 81  (12/28/2009)   Total bilirubin: 0.4  (12/28/2009)    Lipid flowsheet reviewed?: Yes   Progress toward LDL goal: Unchanged  Hypertension   Last Blood Pressure: 130 / 80  (05/21/2010)   Serum creatinine: 0.86  (12/28/2009)   Serum potassium 3.8  (12/28/2009)    Hypertension flowsheet reviewed?: Yes   Progress toward BP goal: At goal  Self-Management Support :   Personal Goals (by the next clinic visit) :     Personal A1C goal: 7  (04/07/2009)     Personal blood pressure goal: 130/80  (04/07/2009)     Personal LDL goal: 70  (04/07/2009)    Patient will work on the following items until the next clinic visit to reach self-care goals:     Medications and monitoring: take my medicines every day, bring all of my medications to every visit  (05/21/2010)     Eating: drink diet soda or water instead of juice or soda, eat more vegetables, use fresh or frozen vegetables, eat foods that are low in salt, eat baked foods instead of fried foods, eat fruit for snacks and desserts, limit or avoid alcohol  (05/21/2010)     Activity: take a 30 minute walk every day  (04/07/2009)     Home glucose monitoring frequency: 1 time daily  (04/07/2009)    Diabetes self-management support: Written self-care plan  (05/21/2010)   Diabetes care plan printed    Hypertension self-management support: Written self-care plan  (05/21/2010)   Hypertension self-care plan printed.    Hypertension self-management support not done because: Good outcomes  (04/07/2009)    Lipid self-management support: Written self-care plan  (05/21/2010)   Lipid  self-care plan printed.    Lipid self-management support not done because: Good outcomes  (04/07/2009)

## 2010-10-02 NOTE — Assessment & Plan Note (Signed)
Summary: Rx Clinic - Diabetes F/U   Vital Signs:  Patient Profile:   60 Years Old Female Height:     61.5 inches Weight:      180 pounds BMI:     33.58 Pulse rate:   82 / minute BP sitting:   110 / 67  Vitals Entered By: Lillia Pauls CMA (July 02, 2007 8:58 AM)                 Diabetes Management History:      The patient is a 60 years old female who comes in for evaluation of DM Type 2.  She is (or has been) enrolled in the "Diabetic Education Program".  She states that she is not following the diet appropriately.  She is checking home blood sugars.  She says that she is not exercising regularly.     Current Allergies: DARVON (PROPOXYPHENE HCL) DIOVAN (VALSARTAN) FLAGYL (METRONIDAZOLE) METOPROLOL TARTRATE (METOPROLOL TARTRATE) NORVASC (AMLODIPINE BESYLATE) PRINZIDE (LISINOPRIL-HYDROCHLOROTHIAZIDE)        Impression & Recommendations:  Problem # 1:  DIABETES MELLITUS II, UNCOMPLICATED (ICD-250.00) Assessment: Unchanged Persistently elevated post-prandial CBGs greater than 200 (bedtime readings) despite Lantus, metformin, and glipizide.  Patient is willing to make changes to improve control.  A1C today was measured at 6.9 which is similar to previous reading of 6.8 on 03/03/07.  Stopped Glipizide.  Initiated Novolog 4 units in the AM and 6 untis in the PM prior meals (twice daily). Patient verbalized understanding.  The following medications were removed from the medication list:    Glipizide 10 Mg Tb24 (Glipizide) .[name]... Take 1 tablet by mouth twice a day  Her updated medication list for this problem includes:    Lantus Solostar 100 Unit/ml Soln (Insulin glargine) .[name]... As directed 18 units each morning    Hyzaar 100-25 Mg Tabs (Losartan potassium-hctz) .[name]... 1 by mouth once daily    Metformin Hcl 1000 Mg Tabs (Metformin hcl) .[name]... Take 1 tablet by mouth twice a day    Bayer Aspirin 325 Mg Tabs (Aspirin) .[name]... Take 1 tablet by mouth once a day    Novolog Flexpen  100 Unit/ml Soln (Insulin aspart) .Marland KitchenMarland KitchenMarland KitchenMarland Kitchen 4 units prior to breakfast 6 units prior to evening meal supply one box of pens  Orders: A1C-[facility] (93235) Direct LDL-[facility] (57322-02542) Basic Met-[facility] (70623-76283) Reassessment Each 15 min unit- [facility] (15176)   Problem # 2:  HYPERTENSION, BENIGN SYSTEMIC (ICD-401.1) Blood pressure at goal.   BMET obtained today.  Refilled K-Dur as patient was in need of refills.  Her updated medication list for this problem includes:    Hyzaar 100-25 Mg Tabs (Losartan potassium-hctz) .[name]... 1 by mouth once daily    Metoprolol Succinate 25 Mg Tb24 (Metoprolol succinate) .[name]... Take one tab  two times a day    Clonidine Hcl 0.1 Mg Tabs (Clonidine hcl) .[name]... 1 tablet by mouth twice a day    Lasix 20 Mg Tabs (Furosemide) .[name]... Take 1 tablet by mouth twice a day  Orders: Basic Met-[facility] (16073-71062) Reassessment Each 15 min unit- [facility] (69485)   Problem # 3:  HYPERCHOLESTEROLEMIA (ICD-272.0) Obtained direct LDL today.  Last cholesterol panel was at goal for LDL (69) and HDL was 46. Continue Crestor at this time. Her updated medication list for this problem includes:    Crestor 20 Mg Tabs (Rosuvastatin calcium) .[name]... Take 1 tablet by mouth once a day  Orders: Direct LDL-[facility] (46270-35009) Reassessment Each 15 min unit- [facility] (38182)   Complete Medication List: 1)  Lantus Solostar 100 Unit/ml Soln (Insulin glargine) .... As directed 18 units each morning 2)  Hyzaar 100-25 Mg Tabs (Losartan potassium-hctz) .[name].. 1 by mouth once daily 3)  Metoprolol Succinate 25 Mg Tb24 (Metoprolol succinate) .... Take one tab  two times a day 4)  Clonidine Hcl 0.1 Mg Tabs (Clonidine hcl) .[name].. 1 tablet by mouth twice a day 5)  Crestor 20 Mg Tabs (Rosuvastatin calcium) .... Take 1 tablet by mouth once a day 6)  Lasix 20 Mg Tabs (Furosemide) .... Take 1 tablet by mouth twice a day 7)  Metformin Hcl 1000 Mg Tabs (Metformin hcl) .... Take 1 tablet by mouth twice a day 8)  Nexium 40 Mg Cpdr  (Esomeprazole magnesium) .[name].. 1 capsule by mouth twice a day 9)  Reglan 10 Mg Tabs (Metoclopramide hcl) .... Take 1 tablet by mouth twice a day 10)  Singulair 10 Mg Tabs (Montelukast sodium) .... Take 1 tablet by mouth once a day 11)  Spiriva Handihaler 18 Mcg Caps (Tiotropium bromide monohydrate) .... Inhale 1 capsule by mouth once a day 12)  Sucralfate 1 Gm Tabs (Sucralfate) .... Take 1 tablet by mouth four times a day 13)  Nitroglycerin 0.4 Mg Subl (Nitroglycerin) .... Place 1 tablet under tongue as directed 14)  Acyclovir 800 Mg Tabs (Acyclovir) .... Take 1 tablet daily 15)  Albuterol Sulfate 1.25 Mg/34ml Nebu (Albuterol sulfate) .... Inhale 1 vial as directed twice a day 16)  Bayer Aspirin 325 Mg Tabs (Aspirin) .... Take 1 tablet by mouth once a day 17)  Proventil Hfa 108 (90 Base) Mcg/act Aers (Albuterol sulfate) .... Inhale 2 puff using inhaler every four hours 18)  Flexfot Cpap Mask 407  19)  Lancets For Glucometer Testing  20)  Novolog Flexpen 100 Unit/ml Soln (Insulin aspart) .... 4 units prior to breakfast 6 units prior to evening meal supply one box of pens 21)  Potassium Chloride 20 Meq Pack (Potassium chloride) .... One daily  Diabetes Management Assessment/Plan:      The following lipid goals have been established for the patient: Total cholesterol goal of 200; LDL cholesterol goal of 100; HDL cholesterol goal of 40; Triglyceride goal of 200.     Patient Instructions: 1)  Please schedule a follow-up appointment in 3-4  weeks with Rx clinic. Thursday morning. 2)  Check your blood sugars regularly. If your readings are usually above : 250 or below 70 you should contact our office. 3)  Stop GLIPIZIDE. 4)  Start NOVOLOG insulin 15 to 30 minutes prior to meals.  Novolog Dose: 4units prior to Breakfast and 6 units prior to Evening meal 5)  Exercise more often, Walk most days 15 minutes or more, 2-3 times per day.    Prescriptions: POTASSIUM CHLORIDE 20 MEQ  PACK (POTASSIUM  CHLORIDE) one daily  #30 x 11   Entered and Authorized by:   Madelon Lips PHARMD   Signed by:   Madelon Lips PHARMD on 07/02/2007   Method used:   Telephoned to ...       [facility] Pierini / [facility]       [address]       [place]       [place], [place]  [address]       Ph: [phone]       Fax: [phone]   RxID:   [id number] NOVOLOG FLEXPEN 100 UNIT/ML  SOLN (INSULIN ASPART) 4 units prior to Breakfast 6 units prior to Evening Meal Supply one box of  pens  #1 x 11   Entered and Authorized by:   Madelon Lips PHARMD   Signed by:   Madelon Lips PHARMD on 07/02/2007   Method used:   Telephoned to ...       [facility]adene Pierini / [facility]       [address]       [place], [place]  [address]       Ph: [phone]       Fax: [phone]   RxID:   [id number]  ] Laboratory Results   Blood Tests   Date/Time Received: July 02, 2007 9:23 AM  Date/Time Reported: July 02, 2007 9:35 AM   HGBA1C: 6.9%   (Normal Range: Non-Diabetic - 3-6%   Control Diabetic - 6-8%)  Comments: ...................................................................DONNA [facility]  July 02, 2007 9:35 AM

## 2010-10-02 NOTE — Progress Notes (Signed)
Summary: Results  Phone Note Call from Patient Call back at Home Phone [phone]   Reason for Call: Lab or Test Results Summary of Call: pt is wanting test results Initial call taken by: Haydee Salter,  December 01, 2006 11:47 AM  Follow-up for Phone Call        called pt informed of labs wnl Follow-up by: Arlyss Repress CMA,,  December 01, 2006 11:54 AM

## 2010-10-02 NOTE — Miscellaneous (Signed)
  Clinical Lists Changes will also try adding vit B12 shot for neurological signs of dizziness, peripheral neuopathy NOS Orders: Added new Test order of Provider Misc Charge- [facility] (Misc) - Signed      Complete Medication List: 1)  Novolog Mix 70/30 Flexpen 70-30 % Susp (Insulin aspart prot & aspart) .... 28 units prior and breakfast and 28 units prior to evening meal.  dispense one month supply. 2)  Metformin Hcl 1000 Mg Tabs (Metformin hcl) .... Take 1.5  tablets by mouth in in the am and 1 tablets in the pm 3)  Potassium Chloride 40 Meq/60ml (20%) Liqd (Potassium chloride) .[name].. 15 cc two times a day disp qs 3 m 4)  Hyzaar 100-25 Mg Tabs (Losartan potassium-hctz) .[name].. 1 by mouth once daily 5)  Lopressor 50 Mg Tabs (Metoprolol tartrate) .... Two times a day 6)  Clonidine Hcl 0.1 Mg Tabs (Clonidine hcl) .[name].. 1 tablet by mouth twice a day 7)  Lasix 20 Mg Tabs (Furosemide) .... Take 1 tablet by mouth twice a day 8)  Simvastatin 40 Mg Tabs (Simvastatin) .[name].. 1 by mouth once daily 9)  Nexium 40 Mg Cpdr (Esomeprazole magnesium) .[name].. 1 capsule by mouth twice a day 10)  Acyclovir 800 Mg Tabs (Acyclovir) .... Take 1 tabletb id/qd as directed 11)  Lancets For Glucometer Testing  12)  Brovana 15 Mcg/62ml Nebu (Arformoterol tartrate) .[name].. 1 neb every twice daily if needed 13)  Bayer Aspirin 325 Mg Tabs (Aspirin) .... Take 1 tablet by mouth once a day 14)  Flexfot Cpap Mask 407  15)  Zyrtec Allergy 10 Mg Tabs (Cetirizine hcl) .[name].. 1 by mouth at bedtime 16)  Qvar 80 Mcg/act Aers (Beclomethasone dipropionate) .... 2 puffs twice daily 17)  Xalatan 0.005 % Soln (Latanoprost) .[name].. 1 drop each eye at bedtime 18)  Cpap 12 Advanced  19)  Allergy Vaccine Gh 1:10  20)  Trusopt 2 % Soln (Dorzolamide hcl) .[name].. 1 drop in left eye twice daily 21)  Astepro 137 Mcg/spray Soln (Azelastine hcl) .... 2 sprays daily as needed 22)  Singulair 10 Mg Tabs (Montelukast sodium) .... One by mouth qd 23)  Zoloft 100 Mg Tabs  (Sertraline hcl) .[name].. 1 1/2 tabs by mouth qd 24)  Gabapentin 300 Mg Caps (Gabapentin) .[name].. 1 by mouth bid 25)  Allopurinol 300 Mg Tabs (Allopurinol) .[name].. 1 by mouth qd    an appointment has been scheduled for nurse visit for Jan 04, 2010 to receive Vitamin B12 injection. [name] Lo RN  December 29, 2009 3:48 PM

## 2010-10-02 NOTE — Assessment & Plan Note (Signed)
Summary: F/U/KH   Vital Signs:  Patient profile:   60 year old female Weight:      182.7 pounds Temp:     98.7 degrees F oral Pulse rate:   93 / minute Pulse rhythm:   regular BP sitting:   138 / 78  (right arm) Cuff size:   large  Vitals Entered By: Loralee Pacas CMA (May 30, 2010 11:18 AM) CC: follow-up visit   Primary Care Provider:  Denny Levy MD  CC:  follow-up visit.  History of Present Illness: f/u starting lyrica--maybe a little better control of burning pains in legs. no problems witjh med.  having trouble with her balance--seeing PT and they are working with her balance as well as her hand--they have discussed using a wlker instead of a cane.  hand is a little more functional and she is able to give herself the insulin shots most days. NOt able to get her CBGs done as easily  Habits & Providers  Alcohol-Tobacco-Diet     Tobacco Status: never     Tobacco Counseling: not indicated; no tobacco use  Current Medications (verified): 1)  Lantus Solostar 100 Unit/ml Soln (Insulin Glargine) .... 40units Subcutaneously Daily Disp Qs 1 M in Solostar Pen This Replaces 70-30 Mix 2)  Metformin Hcl 1000 Mg Tabs (Metformin Hcl) .... Take 1.5  Tablets By Mouth in in The Am and 1 Tablets in The Pm 3)  Potassium Chloride 40 Meq/33ml (20%) Liqd (Potassium Chloride) .[name].. 15 Ml Once Daily 4)  Hyzaar 100-25 Mg  Tabs (Losartan Potassium-Hctz) .[name].. 1 By Mouth Once Daily 5)  Metoprolol Succinate 50 Mg Xr24h-Tab (Metoprolol Succinate) .... Take One Tablet Two Times A Day 6)  Lasix 20 Mg Tabs (Furosemide) .... Take 1 Tablet By Mouth Daily 7)  Simvastatin 40 Mg Tabs (Simvastatin) .[name].. 1 By Mouth Once Daily 8)  Nexium 40 Mg Cpdr (Esomeprazole Magnesium) .[name].. 1 Capsule By Mouth Twice A Day 9)  Acyclovir 800 Mg Tabs (Acyclovir) .... Take 1 Tabletb Id/qd As Directed 10)  Lancets For Glucometer Testing 11)  Brovana 15 Mcg/30ml Nebu (Arformoterol Tartrate) .[name].. 1 Neb Every Twice Daily If  Needed 12)  Bayer Aspirin 325 Mg Tabs (Aspirin) .... Take 1 Tablet By Mouth Once A Day 13)  Flexfot Cpap Mask 407 14)  Zyrtec Allergy 10 Mg  Tabs (Cetirizine Hcl) .[name].. 1 By Mouth At Bedtime On Hold Per Dr Maple Hudson 15)  Qvar 80 Mcg/act  Aers (Beclomethasone Dipropionate) .... 2 Puffs Twice Daily 16)  Xalatan 0.005 %  Soln (Latanoprost) .[name].. 1 Drop Each Eye At Bedtime 17)  Cpap 12 Advanced 18)  Allergy Vaccine Gh 1:10 19)  Trusopt 2 % Soln (Dorzolamide Hcl) .[name].. 1 Drop in Left Eye Twice Daily 20)  Astepro 137 Mcg/spray Soln (Azelastine Hcl) .... 2 Sprays Daily As Needed 21)  Singulair 10 Mg Tabs (Montelukast Sodium) .... One By Mouth Qd 22)  Zoloft 100 Mg Tabs (Sertraline Hcl) .[name].. 1 1/2 Tabs By Mouth Qd 23)  Allopurinol 300 Mg Tabs (Allopurinol) .[name].. 1 By Mouth Qd 24)  Diabetic Shoes .... Patient With Peripheral Neuropathy and Loss of Sensation in Feet, Some Callouses 25)  Fluconazole 100 Mg Tabs (Fluconazole) .[name].. 1 By Mouth Every Other Day For Three Doses 26)  Lyrica 50 Mg Caps (Pregabalin) .... One By Mouth Tid 27)  Nortriptyline Hcl 25 Mg Caps (Nortriptyline Hcl) .... One By Mouth Q Hs  Allergies: 1)  Darvon 2)  Diovan (Valsartan) 3)  Flagyl (Metronidazole) 4)  Norvasc (Amlodipine Besylate) 5)  Prinzide  Physical Exam  General:  alert.   Msk:  left hand has much more finger movement, still poor strength, not much fine motor control at all.  Neurologic:  GAIT is unstable. Even with cane she is unsteady--I think part of this is due to her visual loss but it does seem worse   Impression & Recommendations:  Problem # 1:  HYPERTENSION, BENIGN SYSTEMIC (ICD-401.1)  Her updated medication list for this problem includes:    Hyzaar 100-25 Mg Tabs (Losartan potassium-hctz) .[name]... 1 by mouth once daily    Metoprolol Succinate 50 Mg Xr24h-tab (Metoprolol succinate) .[name]... Take one tablet two times a day    Lasix 20 Mg Tabs (Furosemide) .[name]... Take 1 tablet by mouth daily  Orders: [facility]- Est   Level 4 (99214)  Problem # 2:  PERIPHERAL NEUROPATHY (ICD-356.9)  Orders: [facility]- Est  Level 4 (81191) will increase lyrica with goal of 300 qd  Problem # 3:  GAIT IMBALANCE (ICD-781.2) agree with PT to eval this as well also wil arrange for a home safety eval esp for bathroom equipment rtc 1 m  Complete Medication List: 1)  Lantus Solostar 100 Unit/ml Soln (Insulin glargine) .... 40units subcutaneously daily disp qs 1 m in solostar pen this replaces 70-30 mix 2)  Metformin Hcl 1000 Mg Tabs (Metformin hcl) .... Take 1.5  tablets by mouth in in the am and 1 tablets in the pm 3)  Potassium Chloride 40 Meq/35ml (20%) Liqd (Potassium chloride) .[name].. 15 ml once daily 4)  Hyzaar 100-25 Mg Tabs (Losartan potassium-hctz) .[name].. 1 by mouth once daily 5)  Metoprolol Succinate 50 Mg Xr24h-tab (Metoprolol succinate) .... Take one tablet two times a day 6)  Lasix 20 Mg Tabs (Furosemide) .... Take 1 tablet by mouth daily 7)  Simvastatin 40 Mg Tabs (Simvastatin) .[name].. 1 by mouth once daily 8)  Nexium 40 Mg Cpdr (Esomeprazole magnesium) .[name].. 1 capsule by mouth twice a day 9)  Acyclovir 800 Mg Tabs (Acyclovir) .... Take 1 tabletb id/qd as directed 10)  Lancets For Glucometer Testing  11)  Brovana 15 Mcg/61ml Nebu (Arformoterol tartrate) .[name].. 1 neb every twice daily if needed 12)  Bayer Aspirin 325 Mg Tabs (Aspirin) .... Take 1 tablet by mouth once a day 13)  Flexfot Cpap Mask 407  14)  Zyrtec Allergy 10 Mg Tabs (Cetirizine hcl) .[name].. 1 by mouth at bedtime on hold per dr young 15)  Qvar 80 Mcg/act Aers (Beclomethasone dipropionate) .... 2 puffs twice daily 16)  Xalatan 0.005 % Soln (Latanoprost) .[name].. 1 drop each eye at bedtime 17)  Cpap 12 Advanced  18)  Allergy Vaccine Gh 1:10  19)  Trusopt 2 % Soln (Dorzolamide hcl) .[name].. 1 drop in left eye twice daily 20)  Astepro 137 Mcg/spray Soln (Azelastine hcl) .... 2 sprays daily as needed 21)  Singulair 10 Mg Tabs (Montelukast sodium) .... One by mouth qd 22)   Zoloft 100 Mg Tabs (Sertraline hcl) .[name].. 1 1/2 tabs by mouth qd 23)  Allopurinol 300 Mg Tabs (Allopurinol) .[name].. 1 by mouth qd 24)  Diabetic Shoes  .... Patient with peripheral neuropathy and loss of sensation in feet, some callouses 25)  Fluconazole 100 Mg Tabs (Fluconazole) .[name].. 1 by mouth every other day for three doses 26)  Lyrica 50 Mg Caps (Pregabalin) .... One by mouth am, one at mid day and two by mouth qhs120 27)  Nortriptyline Hcl 25 Mg Caps (Nortriptyline hcl) .... One by mouth  q hs

## 2010-10-02 NOTE — Assessment & Plan Note (Signed)
Summary: f87m/per check out/lg   Visit Type:  Follow-up Primary Provider:  Denny Levy MD  CC:  pt has gout.  History of Present Illness: [name] [name] is a 60 year old woman, who I follow in clinic. She was previously followed by Dr. Earlie Server, and I saw her in the fall of last year. She has a history of hypertension, dyslipidemia, coughing, allergies.  Since seen her biggest complaint is her coughing. She also has now had gout.  she was placed on ibuprofen 800 t.i.d. until the end of September when she is followup in primary clinic.  The patient says she gets short of breath very easily. Not sure if it is all pulmonary and assiciated with her cough.  she denies chest pain.  New Orders:     1)  EKG w/ Interpretation (93000)     2)  Nuclear Stress Test (Nuc Stress Test)     3)  TLB-BMP (Basic Metabolic Panel-BMET) (80048-METABOL)     4)  TLB-AST (SGOT) (84450-SGOT)     5)  TLB-Uric Acid, Blood (84550-URIC)   Problems Prior to Update: 1)  Shortness of Breath  (ICD-786.05) 2)  Hyperlipidemia-mixed  (ICD-272.4) 3)  Chest Pain Unspecified  (ICD-786.50) 4)  ? 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 of Asthma  (ICD-493.90) 18)  Irritable Bowel Syndrome  (ICD-564.1) 19)  Glaucoma  (ICD-365.9) 20)  Depression, Major, Recurrent  (ICD-296.30) 21)  Cataract Extraction Status  (ICD-V45.61) 22)  S/P Vag Hyst 250 Gm/<  (ZOX-09604) 23)  Allergic Rhinitis  (ICD-477.9) 24)  Influenza Due To Id Novel H1n1  Influenza Virus  (ICD-488.1)  Medications Prior to Update: 1)  Novolog Mix 70/30 Flexpen 70-30 % Susp (Insulin Aspart Prot & Aspart) .... 28 Units Prior and Breakfast and 28 Units Prior To Evening Meal.  Dispense One Month Supply. 2)  Metformin Hcl 1000 Mg Tabs (Metformin Hcl) .... Take 1.5  Tablets By Mouth in in The Am and 1 Tablets in The Pm 3)  Potassium Chloride Crys Cr 20 Meq Cr-Tabs (Potassium Chloride Crys Cr) .[name].. 1 By Mouth Once Daily Please Dispense Capsules If Available 4)  Hyzaar 100-25 Mg  Tabs (Losartan Potassium-Hctz) .[name].. 1 By Mouth Once Daily 5)  Lopressor 50 Mg Tabs (Metoprolol Tartrate) .... Two Times A Day 6)  Clonidine Hcl 0.1 Mg Tabs (Clonidine Hcl) .[name].. 1 Tablet By Mouth Twice A Day 7)  Lasix 20 Mg Tabs (Furosemide) .... Take 1 Tablet By Mouth Twice A Day 8)  Simvastatin 40 Mg Tabs (Simvastatin) .[name].. 1 By Mouth Once Daily 9)  Nexium 40 Mg Cpdr (Esomeprazole Magnesium) .[name].. 1 Capsule By Mouth Twice A Day 10)  Acyclovir 800 Mg Tabs (Acyclovir) .... Take 1 Tabletb Id/qd As Directed 11)  Lancets For Glucometer Testing 12)  Brovana 15 Mcg/59ml Nebu (Arformoterol Tartrate) .[name].. 1 Neb Every Twice Daily If Needed 13)  Bayer Aspirin 325 Mg Tabs (Aspirin) .... Take 1 Tablet By Mouth Once A Day 14)  Flexfot Cpap Mask 407 15)  Zyrtec Allergy 10 Mg  Tabs (  Cetirizine Hcl) .[name].. 1 By Mouth At Bedtime 16)  Qvar 80 Mcg/act  Aers (Beclomethasone Dipropionate) .... 2 Puffs Twice Daily 17)  Xalatan 0.005 %  Soln (Latanoprost) .[name].. 1 Drop Each Eye At Bedtime 18)  Cpap 12 Advanced 19)  Allergy Vaccine Gh 1:50 20)  Allergy Vaccine Gh Advance To 1:10 Next Order .... Build Per Protocol 21)  Trusopt 2 % Soln (Dorzolamide Hcl) .[name].. 1 Drop in Left Eye Twice Daily 22)  Astepro 137 Mcg/spray Soln (Azelastine Hcl) .... 2 Sprays Daily As Needed 23)  Singulair 10 Mg Tabs (Montelukast Sodium) .... One By Mouth Qd 24)  Zoloft 100 Mg Tabs (Sertraline Hcl) .[name].. 1 1/2 Tabs By Mouth Qd 25)   Promethazine-Codeine 6.25-10 Mg/40ml Syrp (Promethazine-Codeine) .[name].. 1 Teaspoon Qid As Needed Cough 26)  Ibuprofen 800 Mg Tabs (Ibuprofen) .[name].. 1 Tab By Mouth Three Times A Day For Pain. 27)  Oxycodone Hcl 5 Mg Tabs (Oxycodone Hcl) .[name].. 1 Tab By Mouth Three Times A Day As Needed Next 2 Days For Pain If Not Controlled With Ibuprofen  Current Medications (verified): 1)  Novolog Mix 70/30 Flexpen 70-30 % Susp (Insulin Aspart Prot & Aspart) .... 28 Units Prior and Breakfast and 28 Units Prior To Evening Meal.  Dispense One Month Supply. 2)  Metformin Hcl 1000 Mg Tabs (Metformin Hcl) .... Take 1.5  Tablets By Mouth in in The Am and 1 Tablets in The Pm 3)  Potassium Chloride Crys Cr 20 Meq Cr-Tabs (Potassium Chloride Crys Cr) .[name].. 1 By Mouth Once Daily Please Dispense Capsules If Available 4)  Hyzaar 100-25 Mg  Tabs (Losartan Potassium-Hctz) .[name].. 1 By Mouth Once Daily 5)  Lopressor 50 Mg Tabs (Metoprolol Tartrate) .... Two Times A Day 6)  Clonidine Hcl 0.1 Mg Tabs (Clonidine Hcl) .[name].. 1 Tablet By Mouth Twice A Day 7)  Lasix 20 Mg Tabs (Furosemide) .... Take 1 Tablet By Mouth Twice A Day 8)  Simvastatin 40 Mg Tabs (Simvastatin) .[name].. 1 By Mouth Once Daily 9)  Nexium 40 Mg Cpdr (Esomeprazole Magnesium) .[name].. 1 Capsule By Mouth Twice A Day 10)  Acyclovir 800 Mg Tabs (Acyclovir) .... Take 1 Tabletb Id/qd As Directed 11)  Lancets For Glucometer Testing 12)  Brovana 15 Mcg/12ml Nebu (Arformoterol Tartrate) .[name].. 1 Neb Every Twice Daily If Needed 13)  Bayer Aspirin 325 Mg Tabs (Aspirin) .... Take 1 Tablet By Mouth Once A Day 14)  Flexfot Cpap Mask 407 15)  Zyrtec Allergy 10 Mg  Tabs (Cetirizine Hcl) .[name].. 1 By Mouth At Bedtime 16)  Qvar 80 Mcg/act  Aers (Beclomethasone Dipropionate) .... 2 Puffs Twice Daily 17)  Xalatan 0.005 %  Soln (Latanoprost) .[name].. 1 Drop Each Eye At Bedtime 18)  Cpap 12 Advanced 19)  Allergy Vaccine Gh 1:50 20)  Allergy Vaccine Gh Advance To 1:10 Next Order .... Build Per Protocol 21)   Trusopt 2 % Soln (Dorzolamide Hcl) .[name].. 1 Drop in Left Eye Twice Daily 22)  Astepro 137 Mcg/spray Soln (Azelastine Hcl) .... 2 Sprays Daily As Needed 23)  Singulair 10 Mg Tabs (Montelukast Sodium) .... One By Mouth Qd 24)  Zoloft 100 Mg Tabs (Sertraline Hcl) .[name].. 1 1/2 Tabs By Mouth Qd 25)  Promethazine-Codeine 6.25-10 Mg/66ml Syrp (Promethazine-Codeine) .[name].. 1 Teaspoon Qid As Needed Cough 26)  Ibuprofen 800 Mg Tabs (Ibuprofen) .[name].. 1 Tab By Mouth Three Times A Day For Pain. 27)  Oxycodone Hcl 5 Mg Tabs (Oxycodone Hcl) .[name].. 1 Tab By Mouth Three Times A Day As Needed Next  2 Days For Pain If Not Controlled With Ibuprofen  Allergies (verified): 1)  Darvon (Propoxyphene Hcl) 2)  Diovan (Valsartan) 3)  Flagyl (Metronidazole) 4)  Norvasc (Amlodipine Besylate) 5)  Prinzide (Lisinopril-Hydrochlorothiazide)  Past History:  Past Surgical History: Last updated: 10/28/2007 Anal fistula repair -,  Arthroscopy--R shoulder/R knee 12/01 - 10/07/2000,  breast reduction - 04/21/2003,  BTL--`87 -,  cardiac Cath--nl ef, no cad - 10/04/2003, echo--tricus regurg, mild pul htn, ef 60 - 03/02/2004,  ENT Naso-pharyn scope-normal - 09/11/2006,  Hysterectomy - 10/07/2000, l . rotator cuff surgery - 10/18/2003,  T-score: -.0.54 (low risk currently) - 09/02/2004 bronchoscopy for chronic cough 07/2007  Family History: Last updated: 10/30/2006 Father-, financial difficulties--receives meds from [facility] [phone]), Mohter-diabetes, no family hx colon ca  Social History: Last updated: 03/02/2008 Lives with teenage son (h/o mental dz).   approved for section 8 housing (12/03) Patient states former smoker.   Past Medical History: chronic cough--now followed by pulmonary moderate sleep apnea--cpap 12 Hypertension Dyslipidemia Diabetes Gout  ?hx vocal cord paresise normal 2008  Review of Systems       systems are reviewed. Negative for the above problem except as  Vital Signs:  Patient profile:   60 year old female  Height:      61 inches Weight:      197 pounds Pulse rate:   79 / minute BP sitting:   150 / 80  (left arm) Cuff size:   large  Vitals Entered By: Burnett Kanaris, CNA (May 01, 2009 10:58 AM)  Physical Exam  General:  Well developed, well nourished, in no acute distress. Head:  normocephalic and atraumatic Neck:  JVP normal. Lungs:  upper airway wheezes. Moving air. No Rales Heart:  regular rate and rhythm. S1, S2. No significant murmur Abdomen:  no right upper quadrant tenderness Extremities:  trivial lower extremity edema. Left foot in shoe   EKG  Procedure date:  05/01/2009  Findings:      normal sinus rhythm. 79 beats per minute. Left axis deviation  Impression & Recommendations:  Problem # 1:  HYPERTENSION, BENIGN SYSTEMIC (ICD-401.1) blood pressure is a little high today. She is in considerable pain I would not change for now Her updated medication list for this problem includes:    Hyzaar 100-25 Mg Tabs (Losartan potassium-hctz) .[name]... 1 by mouth once daily    Lopressor 50 Mg Tabs (Metoprolol tartrate) .[name]..[name] Two times a day    Clonidine Hcl 0.1 Mg Tabs (Clonidine hcl) .[name]... 1 tablet by mouth twice a day    Lasix 20 Mg Tabs (Furosemide) .[name]... Take 1 tablet by mouth twice a day    Bayer Aspirin 325 Mg Tabs (Aspirin) .[name]... Take 1 tablet by mouth once a day  Orders: EKG w/ Interpretation (93000)  Problem # 2:  ? of ACUTE GOUTY ARTHROPATHY (ICD-274.01) patient with recent, flare we'll check a uric acid also give her 3 days of prednisone and then allopurinol stop the ibuprofen Her updated medication list for this problem includes:    Allopurinol 300 Mg Tabs (Allopurinol) .[name]... 1 tablet every day start on sept.2nd  Orders: TLB-BMP (Basic Metabolic Panel-BMET) (80048-METABOL) TLB-AST (SGOT) (84450-SGOT) TLB-Uric Acid, Blood (84550-URIC)  Problem # 5:  SHORTNESS OF BREATH (ICD-786.05) I'm not sure if this is associated with her pulmonary issues or if there is a  cardiac component. Will set up for Myoview given that she is a diabetic Her updated medication list for this problem includes:    Hyzaar 100-25 Mg Tabs (Losartan  potassium-hctz) .[name]... 1 by mouth once daily    Lopressor 50 Mg Tabs (Metoprolol tartrate) .[name]..[name] Two times a day    Lasix 20 Mg Tabs (Furosemide) .[name]... Take 1 tablet by mouth twice a day    Bayer Aspirin 325 Mg Tabs (Aspirin) .[name]... Take 1 tablet by mouth once a day  Problem # 6:  HYPERLIPIDEMIA-MIXED (ICD-272.4) keep on statin Her updated medication list for this problem includes:    Simvastatin 40 Mg Tabs (Simvastatin) .[name]... 1 by mouth once daily  Other Orders: Nuclear Stress Test (Nuc Stress Test)  Patient Instructions: 1)  Your physician recommends that you return for lab work in: labs today  will call you with results 2)  Your physician has requested that you have a dobutamine myoview.  For further information please visit [email or web address]  Please follow instruction sheet, as given. 3)  Your physician wants you to follow-up in:   You will receive a reminder letter in the mail two months in advance. If you don't receive a letter, please call our office to schedule the follow-up appointment.   April or May         Start Prednisone pills today   start Allopurinol on 9/2 Prescriptions: ALLOPURINOL 300 MG TABS (ALLOPURINOL) 1 tablet every day start on sept.2nd  #30 x 3   Entered by:   Layne Benton, RN, BSN   Authorized by:   Sherrill Raring, MD, [facility]   Signed by:   Layne Benton, RN, BSN on 05/01/2009   Method used:   Faxed to ...       [facility] (retail)       [address]       [address]       [place], [place]  [address]       Ph: [phone]       Fax: [phone]   RxID:   [id number] PREDNISONE 20 MG TABS (PREDNISONE) 3 pills times 3 days  #6 x 0   Entered by:   Layne Benton, RN, BSN   Authorized by:   Sherrill Raring, MD, [facility]   Signed by:   Layne Benton, RN, BSN on  05/01/2009   Method used:   Faxed to ...       [facility] (retail)       [address]       [place], [place]  [address]       Ph: [phone]       Fax: [phone]   RxID:   [id number]

## 2010-10-02 NOTE — Letter (Signed)
Summary: CPAP & Supplies/[facility]  CPAP & Supplies/[facility]   Imported By: Sherian Rein 03/03/2009 11:18:08  _____________________________________________________________________  External Attachment:    Type:   Image     Comment:   External Document

## 2010-10-02 NOTE — Progress Notes (Signed)
Summary: brovana neb med  Phone Note From Other Clinic Call back at [phone]   Caller: Mayra Reel from [facility] Call For: Dr. Maple Hudson Summary of Call: [phone] Mayra Reel at [facility] called and stated that they have recvd. Brovona order  to be taken every 8 hours. Insurance will only cover two times a day and pt's insurance coverage will only cover Brovona with a diag. of COPD or similiar diag. will not accept Asthma or Asthmatic Bronchitis. Pt only has 1(one) insurance and her copay after insurance will be $70.00 per month. If we do not have a suitable diag. pt will have to pay $480.00 per month for medication. Please advise. Initial call taken by: Alfonso Ramus,  April 05, 2009 12:33 PM  Follow-up for Phone Call        Dx of COPD is appropriate and added. Medlisting changed to two times a day for Brovana. Please send it on. Follow-up by: Waymon Budge MD,  April 05, 2009 9:19 PM  Additional Follow-up for Phone Call Additional follow up Details #1::        New Rx given to [place] for Brovana 15 micrograms/56ml neb med 1 vial two times a day as needed. Rhonda Cobb  April 06, 2009 9:06 AM   New Problems: COPD (ICD-496)   New Problems: COPD (ICD-496) New/Updated Medications: BROVANA 15 MCG/2ML NEBU (ARFORMOTEROL TARTRATE) 1 neb every twice daily if needed Prescriptions: BROVANA 15 MCG/2ML NEBU (ARFORMOTEROL TARTRATE) 1 neb every twice daily if needed  #60 x prn   Entered and Authorized by:   Waymon Budge MD   Signed by:   Waymon Budge MD on 04/06/2009   Method used:   Print then Mail to Patient   RxID:   [id number] BROVANA 15 MCG/2ML NEBU (ARFORMOTEROL TARTRATE) 1 neb every twice daily if needed  #60 x prn   Entered and Authorized by:   Waymon Budge MD   Signed by:   Waymon Budge MD on 04/05/2009   Method used:   Historical   RxID:   [id number]

## 2010-10-02 NOTE — Assessment & Plan Note (Signed)
Summary: 6 months/apc   Primary Provider/Referring Provider:  Denny Levy MD  CC:  follow up viist-allergies. .  History of Present Illness: 66/58- 60 year old woman returning for follow-up of allergic rhinitis with question of asthma, sleep apnea, and vocal cord paralysis.  More cough in hot weather, but nonproductive.  Foreign body sensation in her throat at times, but does not choke while eating.  History of uni lateral vocal cord paralysis, treated with Botox injection (Dr. Gerilyn Pilgrim). continues allergy vaccine here at 1:50.  08/12/08- Allergic rhinitis Dry hacking cough- thinks she would feel better if she could cough something up. Called in cough syrup. We advanced vaccine to 1:10, GH. Still don't find speech therapy report. Has known vocal cord paresis and considered high probability that this is related to her cough. Tessalon no help even at 200 mg. Antibiotics do help for awhile.  6/10/110- allergic rhinitis Got through winter and Spring pollen. Now aware off and on drawing sensation upper chest past several months. cough x 3 weeks, dry. Denies fever or purulence. No sore throat but questions glands in neck.  Says [facility] will be sending script for her to get a new machine. Continues allergy vaccine without problems.  August 07, 2009- Allergic rhinits, OSA At last pressure check in August She had good compliance with 86% of days used over 4 hours. Despite humidifier it dries her nose  with winter heat. CPAP 12 gave AHI 8.7. She agrees to try a step up. Allergy vaccine continues here at 1:10, doing well. She gives herself a neb when needed. Did get flu vax Little routine wheeze or cough. Little sinus congestion or sneeze.  Current Medications (verified): 1)  Novolog Mix 70/30 Flexpen 70-30 % Susp (Insulin Aspart Prot & Aspart) .... 28 Units Prior and Breakfast and 28 Units Prior To Evening Meal.  Dispense One Month Supply. 2)  Metformin Hcl 1000 Mg Tabs (Metformin Hcl) .... Take 1.5   Tablets By Mouth in in The Am and 1 Tablets in The Pm 3)  Potassium Chloride Crys Cr 20 Meq Cr-Tabs (Potassium Chloride Crys Cr) .... 2 in The Am and 1 in The Pm 4)  Hyzaar 100-25 Mg  Tabs (Losartan Potassium-Hctz) .[name].. 1 By Mouth Once Daily 5)  Lopressor 50 Mg Tabs (Metoprolol Tartrate) .... Two Times A Day 6)  Clonidine Hcl 0.1 Mg Tabs (Clonidine Hcl) .[name].. 1 Tablet By Mouth Twice A Day 7)  Lasix 20 Mg Tabs (Furosemide) .... Take 1 Tablet By Mouth Twice A Day 8)  Simvastatin 40 Mg Tabs (Simvastatin) .[name].. 1 By Mouth Once Daily 9)  Nexium 40 Mg Cpdr (Esomeprazole Magnesium) .[name].. 1 Capsule By Mouth Twice A Day 10)  Acyclovir 800 Mg Tabs (Acyclovir) .... Take 1 Tabletb Id/qd As Directed 11)  Lancets For Glucometer Testing 12)  Brovana 15 Mcg/29ml Nebu (Arformoterol Tartrate) .[name].. 1 Neb Every Twice Daily If Needed 13)  Bayer Aspirin 325 Mg Tabs (Aspirin) .... Take 1 Tablet By Mouth Once A Day 14)  Flexfot Cpap Mask 407 15)  Zyrtec Allergy 10 Mg  Tabs (Cetirizine Hcl) .[name].. 1 By Mouth At Bedtime 16)  Qvar 80 Mcg/act  Aers (Beclomethasone Dipropionate) .... 2 Puffs Twice Daily 17)  Xalatan 0.005 %  Soln (Latanoprost) .[name].. 1 Drop Each Eye At Bedtime 18)  Cpap 12 [facility] 19)  Allergy Vaccine Gh 1:50 20)  Trusopt 2 % Soln (Dorzolamide Hcl) .[name].. 1 Drop in Left Eye Twice Daily 21)  Astepro 137 Mcg/spray Soln (Azelastine Hcl) .... 2  Sprays Daily As Needed 22)  Singulair 10 Mg Tabs (Montelukast Sodium) .... One By Mouth Qd 23)  Zoloft 100 Mg Tabs (Sertraline Hcl) .[name].. 1 1/2 Tabs By Mouth Qd 24)  Allopurinol 300 Mg Tabs (Allopurinol) .[name].. 1 Tablet Every Day Start On Sept.2nd 25)  Gabapentin 300 Mg Caps (Gabapentin) .[name].. 1 By Mouth Bid  Allergies (verified): 1)  Darvon (Propoxyphene Hcl) 2)  Diovan (Valsartan) 3)  Flagyl (Metronidazole) 4)  Norvasc (Amlodipine Besylate) 5)  Prinzide (Lisinopril-Hydrochlorothiazide)  Past History:  Past Medical History: Last updated: 05/01/2009 chronic cough--now  followed by pulmonary moderate sleep apnea--cpap 12 Hypertension Dyslipidemia Diabetes Gout  ?hx vocal cord paresise normal 2008  Past Surgical History: Last updated: 10/28/2007 Anal fistula repair -,  Arthroscopy--R shoulder/R knee 12/01 - 10/07/2000,  breast reduction - 04/21/2003,  BTL--`87 -,  cardiac Cath--nl ef, no cad - 10/04/2003, echo--tricus regurg, mild pul htn, ef 60 - 03/02/2004,  ENT Naso-pharyn scope-normal - 09/11/2006,  Hysterectomy - 10/07/2000, l . rotator cuff surgery - 10/18/2003,  T-score: -.0.54 (low risk currently) - 09/02/2004 bronchoscopy for chronic cough 07/2007  Family History: Last updated: 10/30/2006 Father-, financial difficulties--[facility]s from [facility] [phone]), Mohter-diabetes, no family hx colon ca  Social History: Last updated: 03/02/2008 Lives with teenage son (h/o mental dz).   approved for section 8 housing (12/03) Patient states former smoker.   Risk Factors: Exercise: yes (07/23/2007)  Risk Factors: Smoking Status: never (07/26/2009)  Review of Systems      See HPI  The patient denies anorexia, fever, weight loss, weight gain, vision loss, decreased hearing, hoarseness, chest pain, syncope, dyspnea on exertion, peripheral edema, prolonged cough, headaches, hemoptysis, and severe indigestion/heartburn.    Vital Signs:  Patient profile:   60 year old female Height:      61.5 inches Weight:      192.38 pounds BMI:     35.89 O2 Sat:      100 % on Room air Pulse rate:   75 / minute BP sitting:   118 / 78  (left arm) Cuff size:   regular  Vitals Entered By: Reynaldo Minium CMA (August 07, 2009 3:37 PM)  O2 Flow:  Room air  Physical Exam  Additional Exam:  General: A/Ox3; pleasant and cooperative, NAD, alert, overweight, sat 100% room air at rest SKIN: no rash, lesions NODES: no lymphadenopathy HEENT: [place]/AT, EOM- WNL, Conjuctivae- clear, PERRLA, limitied eye sight, TM-WNL, Nose- clear, Throat- clear and wnl, [place] II NECK:  Supple w/ fair ROM, JVD- none, normal carotid impulses w/o bruits Thyroid- normal to palpation CHEST: Clear to P&A, dno wheeze or cough today HEART: RRR, no m/g/r heard ABDOMEN: Soft  HQI:ONGE, nl pulses, no edema  NEURO: Grossly intact to observation      Impression & Recommendations:  Problem # 1:  OBSTRUCTIVE SLEEP APNEA (ICD-327.23)  We will try moving her cpap pressure up to 13. DME may have to make comfort adjustments, depending on how she tolerates this change.  Problem # 2:  ALLERGIC  RHINITIS (ICD-477.9)  She remains convinced the allergy shots have helped her "a whole lot". We can continue. Her updated medication list for this problem includes:    Zyrtec Allergy 10 Mg Tabs (Cetirizine hcl) .[name]... 1 by mouth at bedtime    Astepro 137 Mcg/spray Soln (Azelastine hcl) .[name]... 2 sprays daily as needed  Problem # 3:  COPD (ICD-496) She asks about portable nebulizer for when she is away from home and inhaler doesn't work. Current neb is  getting older and she comments it is "loud".  Other Orders: Est. Patient Level III (09811) DME Referral (DME)  Patient Instructions: 1)  Please schedule a follow-up appointment in 4 months.

## 2010-10-02 NOTE — Assessment & Plan Note (Signed)
Summary: FLU SHOT/[facility]   Allergies: 1)  Darvon (Propoxyphene Hcl) 2)  Diovan (Valsartan) 3)  Flagyl (Metronidazole) 4)  Norvasc (Amlodipine Besylate) 5)  Prinzide (Lisinopril-Hydrochlorothiazide)   Other Orders: Flu Vaccine 77yrs + (16109) Administration Flu vaccine - MCR (U0454)     Flu Vaccine Consent Questions     Do you have a history of severe allergic reactions to this vaccine? no    Any prior history of allergic reactions to egg and/or gelatin? no    Do you have a sensitivity to the preservative Thimersol? no    Do you have a past history of Guillan-Barre Syndrome? no    Do you currently have an acute febrile illness? no    Have you ever had a severe reaction to latex? no    Vaccine information given and explained to patient? yes    Are you currently pregnant? no    Lot Number:[id number]   Exp Date:03/01/2010   Site Given  Right Deltoid IM Clarise Cruz (AAMA)  June 02, 2009 4:04 PM

## 2010-10-02 NOTE — Progress Notes (Signed)
Summary: phn msg  Phone Note Call from Patient Call back at Home Phone [phone]   Caller: Patient Summary of Call: wants to know if auth for [facility] office has gone through Initial call taken by: De Nurse,  Jan 23, 2010 9:30 AM  Follow-up for Phone Call        notified we have not heard back from them, Archie Patten is still working on the referral Follow-up by: Gladstone Pih,  Jan 23, 2010 2:09 PM

## 2010-10-02 NOTE — Consult Note (Signed)
Summary: [facility]   Imported By: Bradly Bienenstock 04/02/2010 12:04:11  _____________________________________________________________________  External Attachment:    Type:   Image     Comment:   External Document

## 2010-10-02 NOTE — Progress Notes (Signed)
Summary: phn msg  Phone Note Call from Patient Call back at Home Phone [phone]   Caller: Patient Summary of Call: Checking on nerve conduction test that she had done. Initial call taken by: Clydell Hakim,  December 06, 2009 2:41 PM  Follow-up for Phone Call        to PCP Follow-up by: Gladstone Pih,  December 06, 2009 3:36 PM  Additional Follow-up for Phone Call Additional follow up Details #1::        pt calling back to see if studies have come in and wants to know the results?  pls advise Additional Follow-up by: De Nurse,  December 08, 2009 9:16 AM    Additional Follow-up for Phone Call Additional follow up Details #2::    told her not on screen yet. pcp will call her when results are in Follow-up by: Golden Circle RN,  December 08, 2009 9:35 AM  Additional Follow-up for Phone Call Additional follow up Details #3:: Details for Additional Follow-up Action Taken: [facility] Pain clinic report is being faxed over...to PCP Additional Follow-up by: Gladstone Pih,  December 08, 2009 9:39 AM

## 2010-10-02 NOTE — Assessment & Plan Note (Signed)
Summary: FU/KH   Vital Signs:  Patient Profile:   60 Years Old Female Temp:     97 degrees F oral Pulse rate:   84 / minute BP sitting:   134 / 75  (left arm)  Pt. in pain?   no  Vitals Entered By: Arlyss Repress CMA, (December 23, 2006 3:01 PM)              Is Patient Diabetic? Yes    PCP:  Albertha Ghee MD  Chief Complaint:  F/UP COUGH.  History of Present Illness: Edema improved in legs after stopping norvasc and starting lasix.  Bp controlled.  Rash on legs resolved.   Cough improved after starting sulcralfate but still having some cough.  Says it is worse with symbicort.  sugars increased.  Fastings are 130-160.  Normally 90-110.  Not eating well.    Medications:      acyclovir (Tablet 800 mg) 1 tablet by mouth twice a day as needed      albuterol sulfate (Solution 1.25 mg/3 mL) 1 vial as directed twice a day as needed      albuterol sulfate (HFA Aerosol Inhaler 90 mcg/Actuation) 2 puff using inhaler every four hours as needed      Allegra-D 12 Hour (Tablet Sustained Release 12 hr 60-120 mg) 1 tablet by mouth twice a day as needed      aspirin (Tablet 325 mg) 1 tablet by mouth once a day      clonidine (Tablet 0.1 mg) 1 tablet by mouth twice a day      Crestor (Tablet 20 mg) 1 tablet by mouth once a day      Diovan (Tablet 320 mg) 1 tablet by mouth once a day      hydrochlorothiazide (Tablet 25 mg) 1 tablet by mouth once a day      K-Dur (Tab Sust.Rel. Particle/Crystal 20 mEq) 1 tablet by mouth once a day      Lasix (Tablet 20 mg) 1 tablet by mouth twice a day as needed      metformin (Tablet 1,000 mg) 1 tablet by mouth twice a day as directed      Miralax (Powder in Packet 17 g (100%)) 1 packet dissolved in water once a day as needed      Nexium (Capsule, Delayed Release(E.C.) 40 mg) 1 capsule by mouth twice a day      nitroglycerin (Tablet, Sublingual 0.4 mg) 1 tablet under tongue as directed      Reglan (Tablet 10 mg) 1 tablet by mouth twice a day one hour before  meals      sertraline (Tablet 100 mg) 1 1/2 tablet by mouth at bedtime      Singulair (Tablet 10 mg) 1 tablet by mouth once a day      Spiriva with HandiHaler (Capsule, w/Inhalation Device 18 mcg) 1 capsule by mouth once a day      sucralfate (Tablet 1 gram) 1 tablet by mouth four times a day with meals      Symbicort (HFA Aerosol Inhaler 160-4.5 mcg/Actuation) 2 puff using inhaler twice a day       Past Medical History:    Reviewed history from 12/12/2006 and no changes required:       Bilateral sensorineural hearing loss, chronic pelvic pain, h/o right hip shingles, heart mumur--systolic ejection, mod sleep apnea--cpap at 12 --start 12/06, Recurrent yeast vaginitis, glaucoma       Hypertension       Asthma  GERD  Past Surgical History:    Reviewed history from 10/30/2006 and no changes required:       Anal fistula repair -, Arthroscopy--R shoulder/R knee 12/01 - 10/07/2000, breast reduction - 04/21/2003, BTL--`87 -, cardiac Cath--nl ef, no cad - 10/04/2003, echo--tricus regurg, mild pul htn, ef 60 - 03/02/2004, ENT Naso-pharyn scope-normal - 09/11/2006, Hysterectomy - 10/07/2000, l. rotator cuff surgery - 10/18/2003, T-score: -.0.54 (low risk currently) - 09/02/2004    Risk Factors:  Tobacco use:  never    Physical Exam  General:     Well-developed,well-nourished,in no acute distress; alert,appropriate and cooperative throughout examination Lungs:     Normal respiratory effort, chest expands symmetrically. Lungs are clear to auscultation, no crackles or wheezes. Heart:     Normal rate and regular rhythm. S1 and S2 normal without gallop,, click, rub or other extra sounds.  2/6 systolic murmur-- chronic Pulses:     R and L dorsalis pedis and posterior tibial pulses are full and equal bilaterally Extremities:     1+ left pedal edema and 1+ right pedal edema--improved.      Impression & Recommendations:  Problem # 1:  ANKLE EDEMA (ICD-782.3) Improved.  Continue lasix 20 mg two  times a day.  Echo recently with nl ef.   Orders: [facility]- Est  Level 4 (16109)   Problem # 2:  DIABETES MELLITUS II, UNCOMPLICATED (ICD-250.00) increase glucophage to 1500 am/1000 pm Orders: [facility]- Est  Level 4 (60454)   Problem # 3:  COUGH (ICD-786.2) Chronic.  Seeing pulm and ent--thought to be gerd.  Some improvement with sulcralfate.  Will try to change symbicort to pulmicort-- given samples to see if this causes her to cough less during med inhalation.    Orders: [facility]- Est  Level 4 (09811)   Problem # 4:  EXANTHEM (ICD-782.1) Resolved.  ?due to edema.  Pt ran out of diovan samples as well.  Will rechallenge with arb in future if having proteinuria.     Patient Instructions: 1)  Increase glucophage to 1 and 1/2 in am and 1 in pm 2)  recheck in 2 months.

## 2010-10-02 NOTE — Medication Information (Signed)
Summary: Prior Auth-Nexium  Prior Auth-Nexium   Imported By: De Nurse 12/14/2008 10:03:28  _____________________________________________________________________  External Attachment:    Type:   Image     Comment:   External Document

## 2010-10-02 NOTE — Assessment & Plan Note (Signed)
Summary: Diabetes F/U Rx clinic   Vital Signs:  Patient Profile:   60 Years Old Female Weight:      194.0 pounds Temp:     97.8 degrees F Pulse rate:   97 / minute BP sitting:   154 / 91  Pt. in pain?   no  Vitals Entered By: Altamese Dilling CMA, (March 03, 2007 11:03 AM)                Chief Complaint:  Pharmacy OV.  Diabetes Management History:      She is (or has been) enrolled in the "Diabetic Education Program".  She states understanding of dietary principles.  She is checking home blood sugars.  She says that she is not exercising regularly.        Reported hypoglycemic symptoms include weakness.  Other comments include: Shakey, jittery when low, .        Her home fasting blood sugars are as follows: highest: 179; lowest: 150; average: 160.  9:00 PM blood sugars are: highest: 285; lowest: 257.             Impression & Recommendations:  Problem # 1:  DIABETES MELLITUS II, UNCOMPLICATED (ICD-250.00) Assessment: Improved Diabetes A1c 6.8 however control is less well controlled than previously. Patient willing to make adjustments to insulin regimen to achieve tighter glycemic control.  Increase Lantus from 5 units to 8 units wach evening.  Reviewed need for exercise as much as she can.  Discussed weight loss plan.   Orders: A1C-[facility] (16109) Reassessment Each 15 min unit- [facility] (60454)   Problem # 2:  HYPERTENSION (ICD-401.9) Assessment: Unchanged Remains uncontrolled.  States unable to tolerate Diovan reports lower leg swelling and little things that looked like blood clots.  She stopped the medicine without being seen.  Willing to consider alternative ARB.  Initiated Cozaar 100mg  samples provided.  Pulse remains high.  If patient tolerates this regimen may consider addition of diltiazem for Heart Rate control and also consider combination of Cozaar and HCTZ in the Hyzaar combo pill at next visit.   Diabetes Management Assessment/Plan:      The following lipid  goals have been established for the patient: Total cholesterol goal of 200; LDL cholesterol goal of 100; HDL cholesterol goal of 40; Triglyceride goal of 200.     Patient Instructions: 1)  Please schedule a follow-up appointment in 5-6 weeks. 2)  It is important that you exercise regularly, walk as much as you can. 3)  You need to lose weight. Consider a lower calorie diet.    4)  Check your blood sugars regularly. If your readings are below 70 you should contact our office. 5)  Start Cozaar in place of Diovan.   6)  Increase Lantus to 8 units daily at night       Laboratory Results   Blood Tests   Date/Time Recieved: March 03, 2007 11:05  AM  Date/Time Reported: March 03, 2007 11:15 AM   HGBA1C: 6.8%   (Normal Range: Non-Diabetic - 3-6%   Control Diabetic - 6-8%)  Comments: ...............test performed by......Marland KitchenBonnie A. [place], [place] (ASCP)

## 2010-10-02 NOTE — Assessment & Plan Note (Signed)
Summary: F/U LAST VISIT/EO   Vital Signs:  Patient profile:   60 year old female Weight:      189 pounds Temp:     97.9 degrees F oral Pulse rate:   80 / minute BP sitting:   114 / 71  (left arm)  Vitals Entered By: Alphia Kava (May 24, 2009 9:34 AM)  CC: f/u gout Is Patient Diabetic? Yes   Primary Care Provider:  Denny Levy MD  CC:  f/u gout.  History of Present Illness: f/u toe pain--they thought it was gout and started her on allopurinol and ibuprofen. it is about 90% better--still a little tender and some erythema. tolerated both meds ok  left finger (thumb, index and ling fingers) numbness. tinles. been getting worse over last weeks. Had similar problem in r wrist at one time and wore a brace. rigt hand dominant  Habits & Providers  Alcohol-Tobacco-Diet     Tobacco Status: never  Current Medications (verified): 1)  Novolog Mix 70/30 Flexpen 70-30 % Susp (Insulin Aspart Prot & Aspart) .... 28 Units Prior and Breakfast and 28 Units Prior To Evening Meal.  Dispense One Month Supply. 2)  Metformin Hcl 1000 Mg Tabs (Metformin Hcl) .... Take 1.5  Tablets By Mouth in in The Am and 1 Tablets in The Pm 3)  Potassium Chloride Crys Cr 20 Meq Cr-Tabs (Potassium Chloride Crys Cr) .... 2 in The Am and 1 in The Pm 4)  Hyzaar 100-25 Mg  Tabs (Losartan Potassium-Hctz) .[name].. 1 By Mouth Once Daily 5)  Lopressor 50 Mg Tabs (Metoprolol Tartrate) .... Two Times A Day 6)  Clonidine Hcl 0.1 Mg Tabs (Clonidine Hcl) .[name].. 1 Tablet By Mouth Twice A Day 7)  Lasix 20 Mg Tabs (Furosemide) .... Take 1 Tablet By Mouth Twice A Day 8)  Simvastatin 40 Mg Tabs (Simvastatin) .[name].. 1 By Mouth Once Daily 9)  Nexium 40 Mg Cpdr (Esomeprazole Magnesium) .[name].. 1 Capsule By Mouth Twice A Day 10)  Acyclovir 800 Mg Tabs (Acyclovir) .... Take 1 Tabletb Id/qd As Directed 11)  Lancets For Glucometer Testing 12)  Brovana 15 Mcg/60ml Nebu (Arformoterol Tartrate) .[name].. 1 Neb Every Twice Daily If Needed 13)  Bayer  Aspirin 325 Mg Tabs (Aspirin) .... Take 1 Tablet By Mouth Once A Day 14)  Flexfot Cpap Mask 407 15)  Zyrtec Allergy 10 Mg  Tabs (Cetirizine Hcl) .[name].. 1 By Mouth At Bedtime 16)  Qvar 80 Mcg/act  Aers (Beclomethasone Dipropionate) .... 2 Puffs Twice Daily 17)  Xalatan 0.005 %  Soln (Latanoprost) .[name].. 1 Drop Each Eye At Bedtime 18)  Cpap 12 Advanced 19)  Allergy Vaccine Gh 1:50 20)  Allergy Vaccine Gh Advance To 1:10 Next Order .... Build Per Protocol 21)  Trusopt 2 % Soln (Dorzolamide Hcl) .[name].. 1 Drop in Left Eye Twice Daily 22)  Astepro 137 Mcg/spray Soln (Azelastine Hcl) .... 2 Sprays Daily As Needed 23)  Singulair 10 Mg Tabs (Montelukast Sodium) .... One By Mouth Qd 24)  Zoloft 100 Mg Tabs (Sertraline Hcl) .[name].. 1 1/2 Tabs By Mouth Qd 25)  Promethazine-Codeine 6.25-10 Mg/39ml Syrp (Promethazine-Codeine) .[name].. 1 Teaspoon Qid As Needed Cough 26)  Ibuprofen 800 Mg Tabs (Ibuprofen) .[name].. 1 Tab By Mouth Three Times A Day For Pain. 27)  Oxycodone Hcl 5 Mg Tabs (Oxycodone Hcl) .[name].. 1 Tab By Mouth Three Times A Day As Needed Next 2 Days For Pain If Not Controlled With Ibuprofen 28)  Prednisone 20 Mg Tabs (Prednisone) .... 3 Pills Times 3  Days 29)  Allopurinol 300 Mg Tabs (Allopurinol) .[name].. 1 Tablet Every Day Start On Sept.2nd  Allergies: 1)  Darvon (Propoxyphene Hcl) 2)  Diovan (Valsartan) 3)  Flagyl (Metronidazole) 4)  Norvasc (Amlodipine Besylate) 5)  Prinzide (Lisinopril-Hydrochlorothiazide)  Social History: Smoking Status:  never  Physical Exam  General:  alert.   Msk:  + tinels and Phalens left wrist. no thenar athropht. normal grip strength  right great oe minimally tender to palpation, slight erythema on dorsum Additional Exam:  Patient given informed consent for injection. Discussed possible complications of infection, bleeding or skin atrophy at site of injection. Possible side effect of avascular necrosis (focal area of bone death) due to steroid use.Appropriate verbal time out taken  Are cleaned and prepped in usual sterile fashion. A ---1/2- cc kennalog plus ---1/2-cc 1% lidocaine without epinephrine was injected into the-Left carpal tunnel--. Patient tolerated procedure well with no complications.    Impression & Recommendations:  Problem # 1:  CARPAL TUNNEL SYNDROME, LEFT (ICD-354.0)  injection today. f/u 1 m and if not better will consider bracing--she is not crazy about wearing a brace--vs   Orders: Injection, intermediate joint - [facility] (20605)  Problem # 2:  ? of ACUTE GOUTY ARTHROPATHY (ICD-274.01)  Her updated medication list for this problem includes:    Ibuprofen 800 Mg Tabs (Ibuprofen) .[name]... 1 tab by mouth three times a day for pain.    Allopurinol 300 Mg Tabs (Allopurinol) .[name]... 1 tablet every day start on sept.2nd her uric acid was only mildly elevated--unclear if this was gout--she has stopped ibuprofen and we decided to continue allopurinol for now recheck bmp next visit  Orders: [facility]- Est  Level 4 (56387)  Problem # 3:  PERIPHERAL NEUROPATHY (ICD-356.9)  unclear if her peripheral neuropathy is source of her hand signs or if this is truly just carpal tunnel. will follow. she has good diabteic control  Orders: [facility]- Est  Level 4 (99214)  Complete Medication List: 1)  Novolog Mix 70/30 Flexpen 70-30 % Susp (Insulin aspart prot & aspart) .... 28 units prior and breakfast and 28 units prior to evening meal.  dispense one month supply. 2)  Metformin Hcl 1000 Mg Tabs (Metformin hcl) .... Take 1.5  tablets by mouth in in the am and 1 tablets in the pm 3)  Potassium Chloride Crys Cr 20 Meq Cr-tabs (Potassium chloride crys cr) .... 2 in the am and 1 in the pm 4)  Hyzaar 100-25 Mg Tabs (Losartan potassium-hctz) .[name].. 1 by mouth once daily 5)  Lopressor 50 Mg Tabs (Metoprolol tartrate) .... Two times a day 6)  Clonidine Hcl 0.1 Mg Tabs (Clonidine hcl) .[name].. 1 tablet by mouth twice a day 7)  Lasix 20 Mg Tabs (Furosemide) .... Take 1 tablet by mouth twice a day  8)  Simvastatin 40 Mg Tabs (Simvastatin) .[name].. 1 by mouth once daily 9)  Nexium 40 Mg Cpdr (Esomeprazole magnesium) .[name].. 1 capsule by mouth twice a day 10)  Acyclovir 800 Mg Tabs (Acyclovir) .... Take 1 tabletb id/qd as directed 11)  Lancets For Glucometer Testing  12)  Brovana 15 Mcg/29ml Nebu (Arformoterol tartrate) .[name].. 1 neb every twice daily if needed 13)  Bayer Aspirin 325 Mg Tabs (Aspirin) .... Take 1 tablet by mouth once a day 14)  Flexfot Cpap Mask 407  15)  Zyrtec Allergy 10 Mg Tabs (Cetirizine hcl) .[name].. 1 by mouth at bedtime 16)  Qvar 80 Mcg/act Aers (Beclomethasone dipropionate) .... 2 puffs twice daily 17)  Xalatan 0.005 %  Soln (Latanoprost) .[name].. 1 drop each eye at bedtime 18)  Cpap 12 Advanced  19)  Allergy Vaccine Gh 1:50  20)  Allergy Vaccine Gh Advance To 1:10 Next Order  .... Build per protocol 21)  Trusopt 2 % Soln (Dorzolamide hcl) .[name].. 1 drop in left eye twice daily 22)  Astepro 137 Mcg/spray Soln (Azelastine hcl) .... 2 sprays daily as needed 23)  Singulair 10 Mg Tabs (Montelukast sodium) .... One by mouth qd 24)  Zoloft 100 Mg Tabs (Sertraline hcl) .[name].. 1 1/2 tabs by mouth qd 25)  Promethazine-codeine 6.25-10 Mg/66ml Syrp (Promethazine-codeine) .[name].. 1 teaspoon qid as needed cough 26)  Ibuprofen 800 Mg Tabs (Ibuprofen) .[name].. 1 tab by mouth three times a day for pain. 27)  Oxycodone Hcl 5 Mg Tabs (Oxycodone hcl) .[name].. 1 tab by mouth three times a day as needed next 2 days for pain if not controlled with ibuprofen 28)  Prednisone 20 Mg Tabs (Prednisone) .... 3 pills times 3 days 29)  Allopurinol 300 Mg Tabs (Allopurinol) .[name].. 1 tablet every day start on sept.2nd   Prevention & Chronic Care Immunizations   Influenza vaccine: given  (06/08/2008)   Influenza vaccine due: 06/08/2009    Tetanus booster: 03/02/2008: given   Tetanus booster due: 03/02/2018    Pneumococcal vaccine: Done.  (06/02/1993)   Pneumococcal vaccine due: None  Colorectal Screening   Hemoccult: not  indicated  (06/09/2008)   Hemoccult due: Not Indicated    Colonoscopy: Done.  (10/31/2005)   Colonoscopy due: 11/01/2015  Other Screening   Pap smear: Not documented   Pap smear due: Not Indicated    Mammogram: ASSESSMENT: Negative - BI-RADS 1^MM DIGITAL SCREENING  (04/14/2009)   Mammogram due: 04/14/2010   Smoking status: never  (05/24/2009)  Diabetes Mellitus   HgbA1C: 6.6  (03/09/2009)   Hemoglobin A1C due: 07/29/2008    Eye exam: Not documented    Foot exam: Not documented   High risk foot: Not documented   Foot care education: Not documented    Urine microalbumin/creatinine ratio: Not documented    Diabetes flowsheet reviewed?: Yes   Progress toward A1C goal: Unchanged  Lipids   Total Cholesterol: 93  (01/04/2009)   LDL: 47  (01/04/2009)   LDL Direct: 55  (07/02/2007)   HDL: 30  (01/04/2009)   Triglycerides: 81  (01/04/2009)    SGOT (AST): 31  (05/01/2009)   SGPT (ALT): 34  (09/14/2008)   Alkaline phosphatase: 78  (09/14/2008)   Total bilirubin: 0.3  (09/14/2008)    Lipid flowsheet reviewed?: Yes   Progress toward LDL goal: Unchanged  Hypertension   Last Blood Pressure: 114 / 71  (05/24/2009)   Serum creatinine: 0.8  (05/23/2009)   Serum potassium 3.9  (05/23/2009)    Hypertension flowsheet reviewed?: Yes   Progress toward BP goal: At goal  Self-Management Support :   Personal Goals (by the next clinic visit) :     Personal A1C goal: 7  (04/07/2009)     Personal blood pressure goal: 130/80  (04/07/2009)     Personal LDL goal: 70  (04/07/2009)    Patient will work on the following items until the next clinic visit to reach self-care goals:     Medications and monitoring: check my blood sugar, check my blood pressure  (04/07/2009)     Eating: drink diet soda or water instead of juice or soda  (04/07/2009)     Activity: take a 30 minute walk every day  (04/07/2009)  Home glucose monitoring frequency: 1 time daily  (04/07/2009)    Diabetes  self-management support: Written self-care plan  (04/07/2009)    Hypertension self-management support: Not documented    Hypertension self-management support not done because: Good outcomes  (04/07/2009)    Lipid self-management support: Not documented     Lipid self-management support not done because: Good outcomes  (04/07/2009)

## 2010-10-02 NOTE — Miscellaneous (Signed)
Summary: pre cert eye appt  Clinical Lists Changes form to pcp to complete so we have approval for her eye dr appt 12/20.Golden Circle RN  July 05, 2009 11:30 AM done

## 2010-10-02 NOTE — Progress Notes (Signed)
Summary: autherization  Phone Note Call from Patient Call back at Home Phone [phone]   Caller: Patient Summary of Call: went to Dr Hilda Lias and stated that we need to do auth for her surgery before they can schedule it Initial call taken by: De Nurse,  Jan 11, 2010 4:46 PM  Follow-up for Phone Call         tried to call Dr. Sanjuan Dame office but the office is closed this afternoon. left message on voicemail that we will work on this next week to see what needs to be done. Theresia Lo RN  Jan 12, 2010 2:28 PM  Follow-up by: Theresia Lo RN,  Jan 12, 2010 2:28 PM  Additional Follow-up for Phone Call Additional follow up Details #1::        sent prior authorization information to dr Sanjuan Dame [facility] awaiting response Additional Follow-up by: Loralee Pacas CMA,  Jan 17, 2010 10:17 AM

## 2010-10-02 NOTE — Miscellaneous (Signed)
Summary: nexium  Clinical Lists Changes nexium ok'd by insurance co thru 09/01/09.Golden Circle RN  February 06, 2009 3:41 PM

## 2010-10-02 NOTE — Progress Notes (Signed)
Summary: refill  Phone Note Refill Request Call back at Home Phone [phone] Message from:  Patient  Refills Requested: Medication #1:  HYZAAR 100-25 MG  TABS 1 by mouth once daily needs refill on Crestor, but is suposed to change it to something different.  Initial call taken by: De Nurse,  May 01, 2009 9:36 AM    Prescriptions: ACYCLOVIR 800 MG TABS (ACYCLOVIR) Take 1 tabletb id/qd as directed  #60 x 12   Entered and Authorized by:   Denny Levy MD   Signed by:   Denny Levy MD on 05/01/2009   Method used:   Historical   RxID:   [id number] SIMVASTATIN 40 MG TABS (SIMVASTATIN) 1 by mouth once daily  #30 x 12   Entered and Authorized by:   Denny Levy MD   Signed by:   Denny Levy MD on 05/01/2009   Method used:   Printed then faxed to ...       [facility] (retail)       [address]       [address]       [place], [place]  [address]       Ph: [phone]       Fax: [phone]   RxID:   [id number] SIMVASTATIN 40 MG TABS (SIMVASTATIN) 1 by mouth once daily  #30 x 12   Entered and Authorized by:   Denny Levy MD   Signed by:   Denny Levy MD on 05/01/2009   Method used:   Historical   RxID:   [id number] HYZAAR 100-25 MG  TABS (LOSARTAN POTASSIUM-HCTZ) 1 by mouth once daily  #30 x 12   Entered and Authorized by:   Denny Levy MD   Signed by:   Denny Levy MD on 05/01/2009   Method used:   Historical   RxID:   [id number]  These were faxed to [facility]

## 2010-10-02 NOTE — Assessment & Plan Note (Signed)
Summary: F/U Diabetes Insulin Rx clinic  Medications Added LANTUS SOLOSTAR 100 UNIT/ML  SOLN (INSULIN GLARGINE) as directed 24 units each morning METFORMIN HCL 1000 MG TABS (METFORMIN HCL) Take 1 tablet by mouth in in the AM and 1.5 tablets in the PM QVAR 80 MCG/ACT  AERS (BECLOMETHASONE DIPROPIONATE) 2 puffs twice daily         Diabetes Management History:      The patient is a 60 years old female who comes in for evaluation of DM Type 2.  She is (or has been) enrolled in the "Diabetic Education Program".  She is checking home blood sugars.  She says that she is exercising.  Type of exercise includes: biking.  Duration of exercise is estimated to be 15 min.  She is doing this 3 times per week.        Other comments include: On a break from walking, but was previously walking 8 laps, once weekly.  Patient states she will restart walking in 2 weeks; she plans to walk once weekly x 8 laps.        Symptoms which suggest diabetic complications include vision problems.  The following changes have been made to her treatment plan since last visit: exercise program.        Her home fasting blood sugars are as follows: highest: 205; lowest: 158; average: 170.     Current Allergies (reviewed today): DARVON (PROPOXYPHENE HCL) DIOVAN (VALSARTAN) FLAGYL (METRONIDAZOLE) METOPROLOL TARTRATE (METOPROLOL TARTRATE) NORVASC (AMLODIPINE BESYLATE) PRINZIDE (LISINOPRIL-HYDROCHLOROTHIAZIDE)    Risk Factors:  Exercise:  yes    Times per week:  3    Type:  biking      Impression & Recommendations:  Problem # 1:  DIABETES MELLITUS II, UNCOMPLICATED (ICD-250.00) Assessment: Deteriorated Worsened blood glucose control since the holiday season started.  Most likely due to Dietary indiscretion and minimal exercise.  Patient has started to ride her bike.  She will restart walking program in early February.  Increased lantus to 24 units each morning.  Will also advise to take 6 units prior to any third  meal.  She will continue to the previous regimen of 6 units of Novolog prior to Breakfst and 8 units prior to her eveing meal at this time.  Patient verbalized understanding of treatment plan.  Per patient request, new supply of insulin needles called into [facility].   We decided to defer A1c testing today due to holiday increases in CBGs. Plan A1c at upcoming visit.    Her updated medication list for this problem includes:    Lantus Solostar 100 Unit/ml Soln (Insulin glargine) .[name]... As directed 24 units each morning    Hyzaar 100-25 Mg Tabs (Losartan potassium-hctz) .[name]... 1 by mouth once daily    Metformin Hcl 1000 Mg Tabs (Metformin hcl) .[name]... Take 1 tablet by mouth in in the am and 1.5 tablets in the pm    Bayer Aspirin 325 Mg Tabs (Aspirin) .[name]... Take 1 tablet by mouth once a day    Novolog Flexpen 100 Unit/ml Soln (Insulin aspart) .Marland KitchenMarland KitchenMarland KitchenMarland Kitchen 6 units prior to breakfast 8 units prior to evening meal supply one box of pens   Complete Medication List: 1)  Lantus Solostar 100 Unit/ml Soln (Insulin glargine) .... As directed 24 units each morning 2)  Hyzaar 100-25 Mg Tabs (Losartan potassium-hctz) .[name].. 1 by mouth once daily 3)  Metoprolol Succinate 25 Mg Tb24 (Metoprolol succinate) .... Take one tab  two times a day 4)  Clonidine Hcl 0.1 Mg  Tabs (Clonidine hcl) .[name].. 1 tablet by mouth twice a day 5)  Crestor 20 Mg Tabs (Rosuvastatin calcium) .... Take 1 tablet by mouth once a day 6)  Lasix 20 Mg Tabs (Furosemide) .... Take 1 tablet by mouth twice a day 7)  Metformin Hcl 1000 Mg Tabs (Metformin hcl) .... Take 1 tablet by mouth in in the am and 1.5 tablets in the pm 8)  Nexium 40 Mg Cpdr (Esomeprazole magnesium) .[name].. 1 capsule by mouth twice a day 9)  Reglan 10 Mg Tabs (Metoclopramide hcl) .... 1/2 three times a day with meals and 1 at bedtime 10)  Singulair 10 Mg Tabs (Montelukast sodium) .... Take 1 tablet by mouth once a day 11)  Sucralfate 1 Gm Tabs (Sucralfate) .... Take 1 tablet by mouth  four times a day 12)  Nitroglycerin 0.4 Mg Subl (Nitroglycerin) .... Place 1 tablet under tongue as directed 13)  Acyclovir 800 Mg Tabs (Acyclovir) .... Take 1 tablet daily 14)  Albuterol Sulfate 1.25 Mg/29ml Nebu (Albuterol sulfate) .... Inhale 1 vial as directed twice a day 15)  Bayer Aspirin 325 Mg Tabs (Aspirin) .... Take 1 tablet by mouth once a day 16)  Proventil Hfa 108 (90 Base) Mcg/act Aers (Albuterol sulfate) .... Inhale 2 puff using inhaler every four hours 17)  Flexfot Cpap Mask 407  18)  Lancets For Glucometer Testing  19)  Novolog Flexpen 100 Unit/ml Soln (Insulin aspart) .... 6 units prior to breakfast 8 units prior to evening meal supply one box of pens 20)  Potassium Chloride 20 Meq Pack (Potassium chloride) .... One daily 21)  Zyrtec Allergy 10 Mg Tabs (Cetirizine hcl) .[name].. 1 by mouth at bedtime 22)  Qvar 80 Mcg/act Aers (Beclomethasone dipropionate) .... 2 puffs twice daily  Diabetes Management Assessment/Plan:      The following lipid goals have been established for the patient: Total cholesterol goal of 200; LDL cholesterol goal of 100; HDL cholesterol goal of 40; Triglyceride goal of 200.     Patient Instructions: 1)  Please schedule a follow-up appointment in 2-3 weeks in Rx Clinic.  2)  Increase your Lantus to 24 units each morning. 3)  Continue Novolog 6 units in the morning and 8 units with evening meal.   4)  If you eat a 3rd meal please take 6 units prior to the meal. 5)  Continue to exercise on your bike 10 minutes most days of the week.    Prescriptions: QVAR 80 MCG/ACT  AERS (BECLOMETHASONE DIPROPIONATE) 2 puffs twice daily  #1 x 0   Entered and Authorized by:   Madelon Lips PHARMD   Signed by:   Madelon Lips PHARMD on 09/15/2007   Method used:   Historical   RxID:   [id number]  ]

## 2010-10-02 NOTE — Assessment & Plan Note (Signed)
Summary: routine visit/eo   Vital Signs:  Patient Profile:   60 Years Old Female Height:     61.5 inches (156.21 cm) Weight:      180.8 pounds BMI:     33.73 Pulse rate:   78 / minute BP sitting:   121 / 74  (right arm)  Pt. in pain?   no  Vitals Entered By: Arlyss Repress CMA, (March 02, 2008 11:22 AM)                 Last TD:  Done. (10/31/1996 12:00:00 AM) TD Result Date:  03/02/2008 TD Result:  given TD Next Due:  10 yr Last Hemoccult Result: Done. (10/03/2005 12:00:00 AM) Hemoccult Next Due:  Not Indicated PAP Next Due:  Not Indicated   PCP:  Denny Levy MD  Chief Complaint:  discuss meds..  History of Present Illness: Diabetes follow up with blood sugars in    good control,   no episodes of low blood sugar. Taking medicines regularly and having no problems with them. Follow up hypertension. Taking medicines regularly with no problems. Not having any any headaches or chest pains.  needs refills of her insulin wit the new doses called in. wants to know when her next bone density due.  continues with cough--her lung doctor has tried several medicines andn none seem to work  she is feeling pretty well    Current Allergies (reviewed today): DARVON (PROPOXYPHENE HCL) DIOVAN (VALSARTAN) FLAGYL (METRONIDAZOLE) METOPROLOL TARTRATE (METOPROLOL TARTRATE) NORVASC (AMLODIPINE BESYLATE) PRINZIDE (LISINOPRIL-HYDROCHLOROTHIAZIDE)  Past Medical History:    chronic cough--now followed by pulmonary    moderate sleep apnea--cpap 12    hx of systolic ejection murmur          Past Surgical History:    Reviewed history from 10/28/2007 and no changes required:       Anal fistula repair -,        Arthroscopy--R shoulder/R knee 12/01 - 10/07/2000,        breast reduction - 04/21/2003,        BTL--`87 -,        cardiac Cath--nl ef, no cad - 10/04/2003, echo--tricus regurg, mild pul htn, ef 60 - 03/02/2004,        ENT Naso-pharyn scope-normal - 09/11/2006,        Hysterectomy -  10/07/2000, l       . rotator cuff surgery - 10/18/2003,        T-score: -.0.54 (low risk currently) - 09/02/2004       bronchoscopy for chronic cough 07/2007   Family History:    Reviewed history from 10/30/2006 and no changes required:       Father-, financial difficulties--[facility] [phone]), [place]-diabetes, no family hx colon ca  Social History:    Lives with teenage son (h/o mental dz).   approved for section 8 housing (12/03)    Patient states former smoker.      Physical Exam  General:     alert and well-developed.   Eyes:     legally blind Neck:     supple, full ROM, and no masses.  no bruits Lungs:     normal respiratory effort, no accessory muscle use, normal breath sounds, and no wheezes.   Heart:     normal rate, regular rhythm, and no murmur heard today   Abdomen:     soft, non-tender, and normal bowel sounds.   Psych:     Oriented X3, memory intact for recent  and remote, normally interactive, not anxious appearing, and not depressed appearing.      Impression & Recommendations:  Problem # 1:  DIABETES MELLITUS II, UNCOMPLICATED (ICD-250.00) Assessment: Unchanged  Her updated medication list for this problem includes:    Lantus Solostar 100 Unit/ml Soln (Insulin glargine) .[name]... As directed 32 units each morning disp 1 month or can dispense three months as patient wishes    Hyzaar 100-25 Mg Tabs (Losartan potassium-hctz) .[name]... 1 by mouth once daily    Metformin Hcl 1000 Mg Tabs (Metformin hcl) .[name]... Take 1.5  tablets by mouth in in the am and 1 tablets in the pm    Bayer Aspirin 325 Mg Tabs (Aspirin) .[name]... Take 1 tablet by mouth once a day    Novolog Flexpen 100 Unit/ml Soln (Insulin aspart) .[name]... 12 units prior to breakfast 12 units prior to evening meal supply one box of pens. dispense one or three months supply as patient wishes  Orders: [facility]- Est  Level 4 (06237)  Future Orders: Comp Met-[facility] ([id number]) ... 04/29/2008 A1C-[facility] ([id number])  ... 04/29/2008   Problem # 2:  HYPERTENSION, BENIGN SYSTEMIC (ICD-401.1) Assessment: Unchanged  Her updated medication list for this problem includes:    Hyzaar 100-25 Mg Tabs (Losartan potassium-hctz) .[name]... 1 by mouth once daily    Metoprolol Succinate 25 Mg Tb24 (Metoprolol succinate) .[name]... Take one tab  two times a day    Clonidine Hcl 0.1 Mg Tabs (Clonidine hcl) .[name]... 1 tablet by mouth twice a day    Lasix 20 Mg Tabs (Furosemide) .[name]... Take 1 tablet by mouth twice a day  Orders: [facility]- Est  Level 4 (10626)  Future Orders: Comp Met-[facility] ([id number]) ... 04/29/2008   Problem # 3:  COUGH (ICD-786.2) Assessment: Unchanged I doubt we are going to get this totally controlled. Seems to be a little less of a problem during todays office visit. Continue f/u w pulmonary Orders: [facility]- Est  Level 4 (50093)   Problem # 4:  Preventive Health Care (ICD-V70.0) discussed preventive needs mammo due next month, update Td today. Will do bone density Jan of 2010. lipids due in march. will get her A1c and cmp next month at her pharm clinic visit  Complete Medication List: 1)  Lantus Solostar 100 Unit/ml Soln (Insulin glargine) .... As directed 32 units each morning disp 1 month or can dispense three months as patient wishes 2)  Hyzaar 100-25 Mg Tabs (Losartan potassium-hctz) .[name].. 1 by mouth once daily 3)  Metoprolol Succinate 25 Mg Tb24 (Metoprolol succinate) .... Take one tab  two times a day 4)  Clonidine Hcl 0.1 Mg Tabs (Clonidine hcl) .[name].. 1 tablet by mouth twice a day 5)  Crestor 20 Mg Tabs (Rosuvastatin calcium) .... Take 1 tablet by mouth once a day 6)  Lasix 20 Mg Tabs (Furosemide) .... Take 1 tablet by mouth twice a day 7)  Metformin Hcl 1000 Mg Tabs (Metformin hcl) .... Take 1.5  tablets by mouth in in the am and 1 tablets in the pm 8)  Nexium 40 Mg Cpdr (Esomeprazole magnesium) .[name].. 1 capsule by mouth twice a day 9)  Singulair 10 Mg Tabs (Montelukast sodium) .... Take 1 tablet by mouth  once a day 10)  Sucralfate 1 Gm Tabs (Sucralfate) .... Take 1 tablet by mouth four times a day 11)  Nitroglycerin 0.4 Mg Subl (Nitroglycerin) .... Place 1 tablet under tongue as directed 12)  Acyclovir 800 Mg Tabs (Acyclovir) .... Take 1 tabletb id/qd as directed 13)  Albuterol Sulfate 1.25 Mg/80ml Nebu (Albuterol sulfate) .... Inhale 1 vial as directed twice a day 14)  Bayer Aspirin 325 Mg Tabs (Aspirin) .... Take 1 tablet by mouth once a day 15)  Flexfot Cpap Mask 407  16)  Lancets For Glucometer Testing  17)  Novolog Flexpen 100 Unit/ml Soln (Insulin aspart) .[name].. 12 units prior to breakfast 12 units prior to evening meal supply one box of pens. dispense one or three months supply as patient wishes 18)  Zyrtec Allergy 10 Mg Tabs (Cetirizine hcl) .[name].. 1 by mouth at bedtime 19)  Qvar 80 Mcg/act Aers (Beclomethasone dipropionate) .... 2 puffs twice daily 20)  Xalatan 0.005 % Soln (Latanoprost) .[name].. 1 drop each eye at bedtime 21)  Allergy Vaccine Gh Build-up  22)  Prednisone 10 Mg Tabs (Prednisone) .... 4 tabs for 2 days, then 3 tabs for 2 days, 2 tabs for 2 days, then 1 tab for 2 days, then stop 23)  Tessalon 200 Mg Caps (Benzonatate) .[name].. 1 by mouth three times a day as needed cough  Other Orders: State-TD Vaccine 7 yrs. & > IM ([id number]) Admin 1st Vaccine ([id number])   Patient Instructions: 1)  rtc to see me in 2-3 months, sooner w porblems   Prescriptions: NOVOLOG FLEXPEN 100 UNIT/ML  SOLN (INSULIN ASPART) 12 units prior to Breakfast 12 units prior to Evening Meal Supply one box of pens. dispense one or three months supply as patient wishes  #1 x 12   Entered and Authorized by:   Denny Levy MD   Signed by:   Denny Levy MD on 03/02/2008   Method used:   Print then Give to Patient   RxID:   [id number] LANTUS SOLOSTAR 100 UNIT/ML  SOLN (INSULIN GLARGINE) as directed 32 units each morning disp 1 month or can dispense three months as patient wishes  #1 x 12   Entered and Authorized by:    Denny Levy MD   Signed by:   Denny Levy MD on 03/02/2008   Method used:   Print then Give to Patient   RxID:   [email or web address]  ]  Tetanus/Td Vaccine    Vaccine Type: Tdap (State)    Site: left deltoid    Mfr: Sanofi Pasteur    Dose: 0.5 ml    Route: IM    Given by: Arlyss Repress CMA,    Exp. Date: 06/30/2010    Lot #: [id number]    VIS given: 07/21/07 version given March 02, 2008.   Impression & Recommendations:  Problem # 1:  DIABETES MELLITUS II, UNCOMPLICATED (ICD-250.00) Assessment: Unchanged  Her updated medication list for this problem includes:    Lantus Solostar 100 Unit/ml Soln (Insulin glargine) .[name]... As directed 32 units each morning disp 1 month or can dispense three months as patient wishes    Hyzaar 100-25 Mg Tabs (Losartan potassium-hctz) .[name]... 1 by mouth once daily    Metformin Hcl 1000 Mg Tabs (Metformin hcl) .[name]... Take 1.5  tablets by mouth in in the am and 1 tablets in the pm    Bayer Aspirin 325 Mg Tabs (Aspirin) .[name]... Take 1 tablet by mouth once a day    Novolog Flexpen 100 Unit/ml Soln (Insulin aspart) .[name]... 12 units prior to breakfast 12 units prior to evening meal supply one box of pens. dispense one or three months supply as patient wishes  Orders: [facility]- Est  Level 4 (02725)  Future Orders: Comp Met-[facility] ([id number]) ... 04/29/2008 A1C-[facility] ([id number]) ... 04/29/2008   Problem #  2:  HYPERTENSION, BENIGN SYSTEMIC (ICD-401.1) Assessment: Unchanged  Her updated medication list for this problem includes:    Hyzaar 100-25 Mg Tabs (Losartan potassium-hctz) .[name]... 1 by mouth once daily    Metoprolol Succinate 25 Mg Tb24 (Metoprolol succinate) .[name]... Take one tab  two times a day    Clonidine Hcl 0.1 Mg Tabs (Clonidine hcl) .[name]... 1 tablet by mouth twice a day    Lasix 20 Mg Tabs (Furosemide) .[name]... Take 1 tablet by mouth twice a day  Orders: [facility]- Est  Level 4 (16109)  Future Orders: Comp Met-[facility] ([id number]) ... 04/29/2008   Problem # 3:  COUGH (ICD-786.2)  Assessment: Unchanged I doubt we are going to get this totally controlled. Seems to be a little less of a problem during todays office visit. Continue f/u w pulmonary Orders: [facility]- Est  Level 4 (91478)   Problem # 4:  Preventive Health Care (ICD-V70.0) discussed preventive needs mammo due next month, update Td today. Will do bone density Jan of 2010. lipids due in march. will get her A1c and cmp next month at her pharm clinic visit  Complete Medication List: 1)  Lantus Solostar 100 Unit/ml Soln (Insulin glargine) .... As directed 32 units each morning disp 1 month or can dispense three months as patient wishes 2)  Hyzaar 100-25 Mg Tabs (Losartan potassium-hctz) .[name].. 1 by mouth once daily 3)  Metoprolol Succinate 25 Mg Tb24 (Metoprolol succinate) .... Take one tab  two times a day 4)  Clonidine Hcl 0.1 Mg Tabs (Clonidine hcl) .[name].. 1 tablet by mouth twice a day 5)  Crestor 20 Mg Tabs (Rosuvastatin calcium) .... Take 1 tablet by mouth once a day 6)  Lasix 20 Mg Tabs (Furosemide) .... Take 1 tablet by mouth twice a day 7)  Metformin Hcl 1000 Mg Tabs (Metformin hcl) .... Take 1.5  tablets by mouth in in the am and 1 tablets in the pm 8)  Nexium 40 Mg Cpdr (Esomeprazole magnesium) .[name].. 1 capsule by mouth twice a day 9)  Singulair 10 Mg Tabs (Montelukast sodium) .... Take 1 tablet by mouth once a day 10)  Sucralfate 1 Gm Tabs (Sucralfate) .... Take 1 tablet by mouth four times a day 11)  Nitroglycerin 0.4 Mg Subl (Nitroglycerin) .... Place 1 tablet under tongue as directed 12)  Acyclovir 800 Mg Tabs (Acyclovir) .... Take 1 tabletb id/qd as directed 13)  Albuterol Sulfate 1.25 Mg/41ml Nebu (Albuterol sulfate) .... Inhale 1 vial as directed twice a day 14)  Bayer Aspirin 325 Mg Tabs (Aspirin) .... Take 1 tablet by mouth once a day 15)  Flexfot Cpap Mask 407  16)  Lancets For Glucometer Testing  17)  Novolog Flexpen 100 Unit/ml Soln (Insulin aspart) .[name].. 12 units prior to breakfast 12 units prior to  evening meal supply one box of pens. dispense one or three months supply as patient wishes 18)  Zyrtec Allergy 10 Mg Tabs (Cetirizine hcl) .[name].. 1 by mouth at bedtime 19)  Qvar 80 Mcg/act Aers (Beclomethasone dipropionate) .... 2 puffs twice daily 20)  Xalatan 0.005 % Soln (Latanoprost) .[name].. 1 drop each eye at bedtime 21)  Allergy Vaccine Gh Build-up  22)  Prednisone 10 Mg Tabs (Prednisone) .... 4 tabs for 2 days, then 3 tabs for 2 days, 2 tabs for 2 days, then 1 tab for 2 days, then stop 23)  Tessalon 200 Mg Caps (Benzonatate) .[name].. 1 by mouth three times a day as needed cough  Other Orders: State-TD Vaccine 7 yrs. & >  IM ([id number]) Admin 1st Vaccine [phone])

## 2010-10-02 NOTE — Progress Notes (Signed)
Summary: phn msg   Phone Note Call from Patient Call back at Home Phone [phone]   Caller: Patient Summary of Call: chose [facility] and they will be giving Dr Jennette Kettle a call if you need to call her number is about Initial call taken by: De Nurse,  October 04, 2008 1:34 PM  Follow-up for Phone Call        pt was told to call and let our ofice know that she has chosen [facility] for her diabetic testing supplies. they will fax a form for the doctor to complete. Follow-up by: Alphia Kava,  October 04, 2008 3:02 PM

## 2010-10-02 NOTE — Assessment & Plan Note (Signed)
Summary: Rx clinic: DM follow up   Vital Signs:  Patient Profile:   60 Years Old Female Height:     61.5 inches (156.21 cm) Weight:      183.5 pounds Pulse rate:   84 / minute BP sitting:   115 / 73                 PCP:  Denny Levy MD  Chief Complaint:  Diabetes Management.  History of Present Illness: 60 yo female with Type 2 DM.  Has been fairly well controlled.  Her last A1c in August was 6.8%.  This is an improvement from her previous A1c (7.4%).  She is checking her blood sugars at least once a day, sometimes twice.  Her metoprolol was increase at her last MD visit due to high blood pressures.  Her blood pressure today is 111/73.  She stated she has been sick over the last couple of days.  She is exercising, biking, walking and bowling.    Diabetes Management History:      The patient is a 60 years old female who comes in for evaluation of DM Type 2.  She is (or has been) enrolled in the "Diabetic Education Program".  She states understanding of dietary principles but she is not following the appropriate diet.  Sensory loss is noted.  Self foot exams are being performed.  She is checking home blood sugars.  She says that she is exercising.  Type of exercise includes: biking.  Duration of exercise is estimated to be 15 min.  She is doing this 3 times per week.        Her home fasting blood sugars are as follows: average: 142.  9:00 PM blood sugars are: average: 115.       Updated Prior Medication List: LANTUS SOLOSTAR 100 UNIT/ML  SOLN (INSULIN GLARGINE) as directed 32 units each morning disp 1 month or can dispense three months as patient wishes HYZAAR 100-25 MG  TABS (LOSARTAN POTASSIUM-HCTZ) 1 by mouth once daily LOPRESSOR 50 MG TABS (METOPROLOL TARTRATE) two times a day CLONIDINE HCL 0.1 MG TABS (CLONIDINE HCL) 1 tablet by mouth twice a day CRESTOR 20 MG TABS (ROSUVASTATIN CALCIUM) Take 1 tablet by mouth once a day LASIX 20 MG TABS (FUROSEMIDE) Take 1 tablet by mouth twice  a day METFORMIN HCL 1000 MG TABS (METFORMIN HCL) Take 1.5  tablets by mouth in in the AM and 1 tablets in the PM NEXIUM 40 MG CPDR (ESOMEPRAZOLE MAGNESIUM) 1 capsule by mouth twice a day SINGULAIR 10 MG TABS (MONTELUKAST SODIUM) Take 1 tablet by mouth once a day NITROGLYCERIN 0.4 MG SUBL (NITROGLYCERIN) Place 1 tablet under tongue as directed ACYCLOVIR 800 MG TABS (ACYCLOVIR) Take 1 tabletb id/qd as directed ALBUTEROL SULFATE 1.25 MG/3ML NEBU (ALBUTEROL SULFATE) Inhale 1 vial as directed twice a day BAYER ASPIRIN 325 MG TABS (ASPIRIN) Take 1 tablet by mouth once a day * FLEXFOT CPAP MASK 407  * LANCETS FOR GLUCOMETER TESTING  NOVOLOG FLEXPEN 100 UNIT/ML  SOLN (INSULIN ASPART) 12 units prior to Breakfast 12 units prior to Evening Meal Supply one box of pens. dispense one or three months supply as patient wishes ZYRTEC ALLERGY 10 MG  TABS (CETIRIZINE HCL) 1 by mouth at bedtime QVAR 80 MCG/ACT  AERS (BECLOMETHASONE DIPROPIONATE) 2 puffs twice daily XALATAN 0.005 %  SOLN (LATANOPROST) 1 drop each eye at bedtime * CPAP 12 ADVANCED  * ALLERGY VACCINE GH 1:50  * ALLERGY VACCINE GH ADVANCE TO  1:10 NEXT ORDER Build per protocol TRUSOPT 2 % SOLN (DORZOLAMIDE HCL) 1 drop in left eye twice daily ASTEPRO 137 MCG/SPRAY SOLN (AZELASTINE HCL) 2 sprays daily as needed PROAIR HFA 108 (90 BASE) MCG/ACT AERS (ALBUTEROL SULFATE) 2 sprays twice a day and as needed for shortness of breath POTASSIUM CHLORIDE CRYS CR 20 MEQ CR-TABS (POTASSIUM CHLORIDE CRYS CR) 1 by mouth qd  Current Allergies: DARVON (PROPOXYPHENE HCL) DIOVAN (VALSARTAN) FLAGYL (METRONIDAZOLE) NORVASC (AMLODIPINE BESYLATE) PRINZIDE (LISINOPRIL-HYDROCHLOROTHIAZIDE)        Impression & Recommendations:  Problem # 1:  DIABETES MELLITUS II, UNCOMPLICATED (ICD-250.00) Assessment: Deteriorated Blood sugar has deteriorated some.  Her morning blood sugar average is 142.  Will increase Lantus to 34 units a day.  Will continue Novolog 12 units  before each meal.  She will follow up in December with Dr. Jennette Kettle.  She will follow up with Pharmacy clinic in 8 weeks.  Total time with patient 30 minutes.  Patient seen with Angelena Sole, MD, Irven Baltimore, PharmD student, and Dois Davenport, PharmD resident.  Her updated medication list for this problem includes:    Lantus Solostar 100 Unit/ml Soln (Insulin glargine) .[name]... As directed 34 units each morning disp 1 month or can dispense three months as patient wishes    Hyzaar 100-25 Mg Tabs (Losartan potassium-hctz) .[name]... 1 by mouth once daily    Metformin Hcl 1000 Mg Tabs (Metformin hcl) .[name]... Take 1.5  tablets by mouth in in the am and 1 tablets in the pm    Bayer Aspirin 325 Mg Tabs (Aspirin) .[name]... Take 1 tablet by mouth once a day    Novolog Flexpen 100 Unit/ml Soln (Insulin aspart) .[name]... 12 units prior to breakfast 12 units prior to evening meal supply one box of pens. dispense one or three months supply as patient wishes  Orders: Reassessment Each 15 min unit- [facility] (60454)   Problem # 2:  HYPERTENSION, BENIGN SYSTEMIC (ICD-401.1) Assessment: Improved At goal of less than 130/80 with the increase of Metoprolol.  Her updated medication list for this problem includes:    Hyzaar 100-25 Mg Tabs (Losartan potassium-hctz) .[name]... 1 by mouth once daily    Lopressor 50 Mg Tabs (Metoprolol tartrate) .[name]..[name] Two times a day    Clonidine Hcl 0.1 Mg Tabs (Clonidine hcl) .[name]... 1 tablet by mouth twice a day    Lasix 20 Mg Tabs (Furosemide) .[name]... Take 1 tablet by mouth twice a day  [facility]   Complete Medication List: 1)  Lantus Solostar 100 Unit/ml Soln (Insulin glargine) .... As directed 34 units each morning disp 1 month or can dispense three months as patient wishes 2)  Hyzaar 100-25 Mg Tabs (Losartan potassium-hctz) .[name].. 1 by mouth once daily 3)  Lopressor 50 Mg Tabs (Metoprolol tartrate) .... Two times a day 4)  Clonidine Hcl 0.1 Mg Tabs (Clonidine  hcl) .[name].. 1 tablet by mouth twice a day 5)  Crestor 20 Mg Tabs (Rosuvastatin calcium) .... Take 1 tablet by mouth once a day 6)  Lasix 20 Mg Tabs (Furosemide) .... Take 1 tablet by mouth twice a day 7)  Metformin Hcl 1000 Mg Tabs (Metformin hcl) .... Take 1.5  tablets by mouth in in the am and 1 tablets in the pm 8)  Nexium 40 Mg Cpdr (Esomeprazole magnesium) .[name].. 1 capsule by mouth twice a day 9)  Singulair 10 Mg Tabs (Montelukast sodium) .... Take 1 tablet by mouth once a day 10)  Nitroglycerin 0.4 Mg  Subl (Nitroglycerin) .... Place 1 tablet under tongue as directed 11)  Acyclovir 800 Mg Tabs (Acyclovir) .... Take 1 tabletb id/qd as directed 12)  Albuterol Sulfate 1.25 Mg/21ml Nebu (Albuterol sulfate) .... Inhale 1 vial as directed twice a day 13)  Bayer Aspirin 325 Mg Tabs (Aspirin) .... Take 1 tablet by mouth once a day 14)  Flexfot Cpap Mask 407  15)  Lancets For Glucometer Testing  16)  Novolog Flexpen 100 Unit/ml Soln (Insulin aspart) .[name].. 12 units prior to breakfast 12 units prior to evening meal supply one box of pens. dispense one or three months supply as patient wishes 17)  Zyrtec Allergy 10 Mg Tabs (Cetirizine hcl) .[name].. 1 by mouth at bedtime 18)  Qvar 80 Mcg/act Aers (Beclomethasone dipropionate) .... 2 puffs twice daily 19)  Xalatan 0.005 % Soln (Latanoprost) .[name].. 1 drop each eye at bedtime 20)  Cpap 12 Advanced  21)  Allergy Vaccine Gh 1:50  22)  Allergy Vaccine Gh Advance To 1:10 Next Order  .... Build per protocol 23)  Trusopt 2 % Soln (Dorzolamide hcl) .[name].. 1 drop in left eye twice daily 24)  Astepro 137 Mcg/spray Soln (Azelastine hcl) .... 2 sprays daily as needed 25)  Proair Hfa 108 (90 Base) Mcg/act Aers (Albuterol sulfate) .... 2 sprays twice a day and as needed for shortness of breath 26)  Potassium Chloride Crys Cr 20 Meq Cr-tabs (Potassium chloride crys cr) .[name].. 1 by mouth qd  Diabetes Management Assessment/Plan:      The following lipid goals have been established  for the patient: Total cholesterol goal of 200; LDL cholesterol goal of 100; HDL cholesterol goal of 40; Triglyceride goal of 200.     Patient Instructions: 1)  Please schedule a follow-up appointment in 2 months with pharmacy clinic. 2)  Increase Lantus to 34 units a day. 3)  Continue Novolog 12 units before each meal. 4)  Continue all your other medications. 5)  Follow up with Dr. Jennette Kettle in December.   ]

## 2010-10-02 NOTE — Miscellaneous (Signed)
Summary: Injection Record / [facility]    Injection Record / [facility]    Imported By: Lennie Odor 01/23/2010 15:24:23  _____________________________________________________________________  External Attachment:    Type:   Image     Comment:   External Document

## 2010-10-02 NOTE — Miscellaneous (Signed)
  Clinical Lists Changes  Problems: Changed problem from Question of  ASTHMA (ICD-493.90) to Question of  ASTHMA, INTERMITTENT (ICD-493.90)

## 2010-10-02 NOTE — Assessment & Plan Note (Signed)
Summary: [facility]   Vital Signs:  Patient profile:   60 year old female Height:      61.5 inches Weight:      184 pounds BMI:     34.33 Pulse rate:   71 / minute Pulse (ortho):   84 / minute BP sitting:   132 / 71  (right arm) BP standing:   115 / 75  Serial Vital Signs/Assessments:  Time      Position  BP       Pulse  Resp  Temp     By 12:00     Standing  115/75   84                    Christian Mate D   Primary Care Provider:  Denny Levy MD   History of Present Illness: [name] [name] arrives in good spirit.  She comlains of leg pain that she describes as burning and fire.   She states that her right leg is worse than the left leg.   She complaings of dizziness with standing for longer than 5 minutes.      Current Medications (verified): 1)  Novolog Mix 70/30 Flexpen 70-30 % Susp (Insulin Aspart Prot & Aspart) .... 28 Units Prior and Breakfast and 28 Units Prior To Evening Meal.  Dispense One Month Supply. 2)  Metformin Hcl 1000 Mg Tabs (Metformin Hcl) .... Take 1.5  Tablets By Mouth in in The Am and 1 Tablets in The Pm 3)  Potassium Chloride 40 Meq/27ml (20%) Liqd (Potassium Chloride) .[name].. 15 Cc Two Times A Day Disp Qs 3 M 4)  Hyzaar 100-25 Mg  Tabs (Losartan Potassium-Hctz) .[name].. 1 By Mouth Once Daily 5)  Metoprolol Succinate 50 Mg Xr24h-Tab (Metoprolol Succinate) .... Take One Tablet Two Times A Day 6)  Clonidine Hcl 0.1 Mg Tabs (Clonidine Hcl) .[name].. 1 Tablet By Mouth Twice A Day 7)  Lasix 20 Mg Tabs (Furosemide) .... Take 1 Tablet By Mouth Twice A Day 8)  Simvastatin 40 Mg Tabs (Simvastatin) .[name].. 1 By Mouth Once Daily 9)  Nexium 40 Mg Cpdr (Esomeprazole Magnesium) .[name].. 1 Capsule By Mouth Twice A Day 10)  Acyclovir 800 Mg Tabs (Acyclovir) .... Take 1 Tabletb Id/qd As Directed 11)  Lancets For Glucometer Testing 12)  Brovana 15 Mcg/16ml Nebu (Arformoterol Tartrate) .[name].. 1 Neb Every Twice Daily If Needed 13)  Bayer Aspirin 325 Mg Tabs (Aspirin) .... Take 1 Tablet By  Mouth Once A Day 14)  Flexfot Cpap Mask 407 15)  Zyrtec Allergy 10 Mg  Tabs (Cetirizine Hcl) .[name].. 1 By Mouth At Bedtime On Hold Per Dr Maple Hudson 16)  Qvar 80 Mcg/act  Aers (Beclomethasone Dipropionate) .... 2 Puffs Twice Daily 17)  Xalatan 0.005 %  Soln (Latanoprost) .[name].. 1 Drop Each Eye At Bedtime 18)  Cpap 12 Advanced 19)  Allergy Vaccine Gh 1:10 20)  Trusopt 2 % Soln (Dorzolamide Hcl) .[name].. 1 Drop in Left Eye Twice Daily 21)  Astepro 137 Mcg/spray Soln (Azelastine Hcl) .... 2 Sprays Daily As Needed 22)  Singulair 10 Mg Tabs (Montelukast Sodium) .... One By Mouth Qd 23)  Zoloft 100 Mg Tabs (Sertraline Hcl) .[name].. 1 1/2 Tabs By Mouth Qd 24)  Allopurinol 300 Mg Tabs (Allopurinol) .[name].. 1 By Mouth Qd 25)  Diabetic Shoes .... Patient With Peripheral Neuropathy and Loss of Sensation in Feet, Some Callouses  Allergies (verified): 1)  Darvon 2)  Diovan (Valsartan) 3)  Flagyl (Metronidazole) 4)  Norvasc (Amlodipine Besylate) 5)  Prinzide  Physical Exam  Extremities:  Lower extremity Physical Exam includes: minimal swelling.  Left / Right / Both ABI overall =   1.17 Right Arm: 130  mmHg    Left Arm:   Right ankle posterior tibial:158    mmHg     dorsalis pedis: 126   mmHg Left ankle posterior tibial:  152   mmHg    dorsalis pedis:  108 mmHg     Impression & Recommendations:  Problem # 1:  CLAUDICATION (ICD-443.9) Assessment Unchanged  Found to have normal ABI.   Overall ABI = 1.17. Leg pain is unlikely due to poor blood flow.   Patient has been of neurotin and woud consider another trial of a medicine for her neuropathic leg pain which is described as burning.   TTFFC =35 minutes.   Seen with:  Lyna Poser , PharmD resident and Kennieth Francois, PharmD candidate.   Orders: Reassessment Each 15 min unit- [facility] (09811)  Problem # 2:  HYPERTENSION, BENIGN SYSTEMIC (ICD-401.1)  Under good control recently.  Patient monitors blood pressure at home.  She describes dizziness with standing  > 5 minutes.   She is willing to decrease lasix to once daily and K supplement to once daily.   Reevaluate blood pressure at next visit with Dr. Jennette Kettle and consider D/C of clonidine if BP remains low and dizziness continues to be a problem.  Her updated medication list for this problem includes:    Hyzaar 100-25 Mg Tabs (Losartan potassium-hctz) .[name]... 1 by mouth once daily    Metoprolol Succinate 50 Mg Xr24h-tab (Metoprolol succinate) .[name]... Take one tablet two times a day    Clonidine Hcl 0.1 Mg Tabs (Clonidine hcl) .[name]... 1 tablet by mouth twice a day    Lasix 20 Mg Tabs (Furosemide) .[name]... Take 1 tablet by mouth daily  [id number]  Complete Medication List: 1)  Novolog Mix 70/30 Flexpen 70-30 % Susp (Insulin aspart prot & aspart) .... 28 units prior and breakfast and 28 units prior to evening meal.  dispense one month supply. 2)  Metformin Hcl 1000 Mg Tabs (Metformin hcl) .... Take 1.5  tablets by mouth in in the am and 1 tablets in the pm 3)  Potassium Chloride 40 Meq/11ml (20%) Liqd (Potassium chloride) .[name].. 15 ml once daily 4)  Hyzaar 100-25 Mg Tabs (Losartan potassium-hctz) .[name].. 1 by mouth once daily 5)  Metoprolol Succinate 50 Mg Xr24h-tab (Metoprolol succinate) .... Take one tablet two times a day 6)  Clonidine Hcl 0.1 Mg Tabs (Clonidine hcl) .[name].. 1 tablet by mouth twice a day 7)  Lasix 20 Mg Tabs (Furosemide) .... Take 1 tablet by mouth daily 8)  Simvastatin 40 Mg Tabs (Simvastatin) .[name].. 1 by mouth once daily 9)  Nexium 40 Mg Cpdr (Esomeprazole magnesium) .[name].. 1 capsule by mouth twice a day 10)  Acyclovir 800 Mg Tabs (Acyclovir) .... Take 1 tabletb id/qd as directed 11)  Lancets For Glucometer Testing  12)  Brovana 15 Mcg/3ml Nebu (Arformoterol tartrate) .[name].. 1 neb every twice daily if needed 13)  Bayer Aspirin 325 Mg Tabs (Aspirin) .... Take 1 tablet by mouth once a day 14)  Flexfot Cpap Mask 407  15)  Zyrtec Allergy 10 Mg Tabs (Cetirizine hcl)  .[name].. 1 by mouth at bedtime on hold per dr young 16)  Qvar 80 Mcg/act Aers (Beclomethasone dipropionate) .... 2 puffs twice daily 17)  Xalatan 0.005 % Soln (Latanoprost) .Marland KitchenMarland KitchenMarland Kitchen  1 drop each eye at bedtime 18)  Cpap 12 Advanced  19)  Allergy Vaccine Gh 1:10  20)  Trusopt 2 % Soln (Dorzolamide hcl) .[name].. 1 drop in left eye twice daily 21)  Astepro 137 Mcg/spray Soln (Azelastine hcl) .... 2 sprays daily as needed 22)  Singulair 10 Mg Tabs (Montelukast sodium) .... One by mouth qd 23)  Zoloft 100 Mg Tabs (Sertraline hcl) .[name].. 1 1/2 tabs by mouth qd 24)  Allopurinol 300 Mg Tabs (Allopurinol) .[name].. 1 by mouth qd 25)  Diabetic Shoes  .... Patient with peripheral neuropathy and loss of sensation in feet, some callouses  Patient Instructions: 1)  Leg evaluation for blood flow was normal.  2)  Decrease Lasix to ONCE Daily AND change potassium liquid to ONCE daily.  3)  Return to see Dr. Jennette Kettle in the next 3-4 weeks.  Prescriptions: LASIX 20 MG TABS (FUROSEMIDE) Take 1 tablet by mouth daily  #30 x 0   Entered by:   Christian Mate D   Authorized by:   Denny Levy MD   Signed by:   Madelon Lips Pharm D on 04/03/2010   Method used:   Historical   RxID:   [id number] POTASSIUM CHLORIDE 40 MEQ/15ML (20%) LIQD (POTASSIUM CHLORIDE) 15 ml ONCE daily  #1 x 0   Entered by:   Christian Mate D   Authorized by:   Denny Levy MD   Signed by:   Madelon Lips Pharm D on 04/03/2010   Method used:   Historical   RxID:   [id number]

## 2010-10-02 NOTE — Assessment & Plan Note (Signed)
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 She is still in  brace but that hand is really not working at all. Ortho has set her up for hand theraoy. Givenher severe peripheral neuropathy of both hands, any loss of function has made her essentially unable to place needle on the flex pen, and also to administer it.  2) Continues to have episodic dizziness--feels like itis related to her bP--she has checked multiple times and usually 120 or so with occasional 140-150. Feelslike she is dropping lower when she has these "spells" but has been unable to check it during one of them. Has done OK with the change inlasix and potassium--no excess edema, no SOB  3)Continued issues with left upper and mid quadrant pain--saw the GI doctor and she seems to think it is related to constipation. Dr Loreta Ave did not think she needed another colonoscopy. Sharnese does have intermittent constipation. Chronic. Unchanged.  Current Medications (verified): 1)  Lantus Solostar 100 Unit/ml Soln (Insulin Glargine) .... 40units Subcutaneously Daily Disp Qs 1 M in Solostar Pen This Replaces 70-30 Mix 2)  Metformin Hcl 1000 Mg Tabs (Metformin Hcl) .... Take 1.5  Tablets By Mouth in in The Am and 1 Tablets in The Pm 3)  Potassium Chloride 40 Meq/73ml (20%) Liqd (Potassium Chloride) .[name].. 15 Ml Once Daily 4)  Hyzaar 100-25 Mg  Tabs (Losartan Potassium-Hctz) .[name].. 1 By Mouth Once Daily 5)  Metoprolol Succinate 50 Mg Xr24h-Tab (Metoprolol Succinate) .... Take One Tablet Two Times A Day 6)  Lasix 20 Mg Tabs  (Furosemide) .... Take 1 Tablet By Mouth Daily 7)  Simvastatin 40 Mg Tabs (Simvastatin) .[name].. 1 By Mouth Once Daily 8)  Nexium 40 Mg Cpdr (Esomeprazole Magnesium) .[name].. 1 Capsule By Mouth Twice A Day 9)  Acyclovir 800 Mg Tabs (Acyclovir) .... Take 1 Tabletb Id/qd As Directed 10)  Lancets For Glucometer Testing 11)  Brovana 15 Mcg/56ml Nebu (Arformoterol Tartrate) .[name].. 1 Neb Every Twice Daily If Needed 12)  Bayer Aspirin 325 Mg Tabs (Aspirin) .... Take 1 Tablet By Mouth Once A Day 13)  Flexfot Cpap Mask 407 14)  Zyrtec Allergy 10 Mg  Tabs (Cetirizine Hcl) .[name].. 1 By Mouth At Bedtime On Hold Per Dr Maple Hudson 15)  Qvar 80 Mcg/act  Aers (Beclomethasone Dipropionate) .... 2 Puffs Twice Daily 16)  Xalatan 0.005 %  Soln (Latanoprost) .[name].. 1 Drop Each Eye At Bedtime 17)  Cpap 12 Advanced 18)  Allergy Vaccine Gh 1:10 19)  Trusopt 2 % Soln (Dorzolamide Hcl) .[name].. 1 Drop in Left Eye Twice Daily 20)  Astepro 137 Mcg/spray Soln (Azelastine Hcl) .... 2 Sprays Daily As Needed 21)  Singulair 10 Mg Tabs (Montelukast Sodium) .... One By Mouth Qd 22)  Zoloft 100 Mg Tabs (Sertraline Hcl) .[name].. 1 1/2 Tabs By Mouth Qd 23)  Allopurinol 300 Mg Tabs (Allopurinol) .[name].. 1 By Mouth Qd 24)  Diabetic Shoes .... Patient With Peripheral Neuropathy and Loss of Sensation in Feet, Some Callouses  25)  Fluconazole 100 Mg Tabs (Fluconazole) .[name].. 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 rotator cuff surgery - 10/18/2003,  T-score: -.0.54 (low risk currently) - 09/02/2004 bronchoscopy for chronic cough 07/2007  Social History: lives alone.   approved for section 8 housing (12/03) Patient states former smoker.   Physical Exam  General:  alert, well-developed, well-nourished, well-hydrated, and overweight-appearing.   Eyes:  legally blnd Abdomen:  soft, non-tender, normal bowel sounds, no distention, no masses, no guarding, no rigidity, and no rebound tenderness.   Msk:  left hand: very weak fibnger flexion--cannot fully create grasp position.  Bilateral hands--significant sensory loss to soft toouch, loss of 2 opoint discriminationl Extremities:  no edema Neurologic:  alert & oriented X3.  antalgic gait with use of cane.  Can rise from chair without my assistance but needs cane and it is slow and difficult for her. ```````  Impression & Recommendations:  Problem # 1:  DIABETES MELLITUS II, UNCOMPLICATED (ICD-250.00)  will change to once a day lantus. We are trying to get home health for her re hermeds  Orders: [facility]- Est  Level 4 (87564)  Problem # 2:  HYPERTENSION, BENIGN SYSTEMIC (ICD-401.1)  agree with Dr Raymondo Band re trial of d/c clonidine. Decrease to 1/2 dose for 4-5 days then d/c. rtc 2 weeks.  Orders: [facility]- Est  Level 4 (99214)  Problem # 3:  CARPAL TUNNEL SYNDROME, BILATERAL (ICD-354.0) essentially she now has loss of 90% hand function so getting her meds in is a bufg problem, see #2  Problem # 4:  DIZZINESS (ICD-780.4)  unclear whather episodes are--potentially d/c pf her clonidine may help. will follow. Also consider possibly need for holter monitor in future--this couls be episodic a fib  Orders: [facility]- Est  Level 4 (99214)  Problem # 5:  ABDOMINAL PAIN, LEFT UPPER QUADRANT (ICD-789.02)  Orders: Radiology other (Radiology  Other) [facility]- Est  Level 4 (33295) assuming this is related to constippation we will try 1/2 bottle of magnesium citrate three times a week  Complete Medication List: 1)  Lantus Solostar 100 Unit/ml Soln (Insulin glargine) .... 40units subcutaneously daily disp qs 1 m in solostar pen this replaces 70-30 mix 2)  Metformin Hcl 1000 Mg Tabs (Metformin hcl) .... Take 1.5  tablets by mouth in in the am and 1 tablets in the pm 3)  Potassium Chloride 40 Meq/7ml (20%) Liqd (Potassium chloride) .[name].. 15 ml once daily 4)  Hyzaar 100-25 Mg Tabs (Losartan potassium-hctz) .[name].. 1 by mouth once daily 5)  Metoprolol Succinate 50 Mg Xr24h-tab (Metoprolol succinate) .... Take one tablet two times a day 6)  Lasix 20 Mg Tabs (Furosemide) .... Take 1 tablet by mouth daily 7)  Simvastatin 40 Mg Tabs (Simvastatin) .[name].. 1 by mouth once daily 8)  Nexium 40 Mg Cpdr (Esomeprazole magnesium) .[name].. 1 capsule by mouth twice a day 9)  Acyclovir 800 Mg Tabs (Acyclovir) .... Take 1 tabletb id/qd as directed 10)  Lancets For Glucometer Testing  11)  Brovana 15 Mcg/74ml Nebu (Arformoterol tartrate) .[name].. 1 neb every twice daily if needed 12)  Bayer Aspirin 325 Mg Tabs (Aspirin) .... Take 1 tablet by mouth once a day 13)  Flexfot Cpap Mask 407  14)  Zyrtec Allergy 10 Mg Tabs (Cetirizine hcl) .[name].. 1 by mouth at bedtime on hold per dr young 15)  Qvar 80 Mcg/act Aers (Beclomethasone dipropionate) .... 2 puffs twice daily 16)  Xalatan 0.005 % Soln (Latanoprost) .[name].. 1 drop each eye at bedtime 17)  Cpap 12 Advanced  18)  Allergy Vaccine Gh 1:10  19)  Trusopt 2 % Soln (Dorzolamide hcl) .[name].. 1 drop in left eye twice daily 20)  Astepro 137 Mcg/spray Soln (Azelastine hcl) .... 2 sprays daily as needed 21)  Singulair 10 Mg Tabs (Montelukast sodium) .... One by mouth qd 22)  Zoloft 100 Mg Tabs (Sertraline hcl) .[name].. 1 1/2 tabs by mouth qd 23)  Allopurinol 300 Mg Tabs (Allopurinol) .[name].. 1 by mouth qd 24)  Diabetic Shoes  .... Patient with  peripheral neuropathy and loss of sensation in feet, some callouses 25)  Fluconazole 100 Mg Tabs (Fluconazole) .[name].. 1 by mouth every other day for three doses Prescriptions: FLUCONAZOLE 100 MG TABS (FLUCONAZOLE) 1 by mouth every other day for three doses  #3 x 1   Entered and Authorized by:   Denny Levy MD   Signed by:   Denny Levy MD on 05/03/2010   Method used:   Printed then faxed to ...       [facility] (retail)       [address]       [place], [place]  [address]       Ph: [phone]       Fax: [phone]   RxID:   [id number] LANTUS SOLOSTAR 100 UNIT/ML SOLN (INSULIN GLARGINE) 40units Subcutaneously daily disp qs 1 m in [employer] this replaces 70-30 mix  #1 x 12   Entered and Authorized by:   Denny Levy MD   Signed by:   Denny Levy MD on 05/03/2010   Method used:   Printed then faxed to ...       [facility] (retail)       [address]       [place], [place]  [address]       Ph: [phone]       Fax: [phone]   RxID:   [id number]

## 2010-10-02 NOTE — Assessment & Plan Note (Signed)
Summary: COUGH/KLW   PCP:  Denny Levy MD  Chief Complaint:  Pt c/o dry cough off and on x2 weeks.  Meds reviewed..  History of Present Illness: 6/24 seeen for cough for 32 weeks  60 year old female with known history of HTN, DM, Hyperlipidemia, chronic cough and hx of paralyzed vocal cord  presents for 2 weeks of increased of chornic cough. Complains of cough has worsened over last week, dry severe at times. Denies chest pain, dyspnea, orthopnea, hemoptysis, fever, n/v/d, edema.      Prior Medication List:  LANTUS SOLOSTAR 100 UNIT/ML  SOLN (INSULIN GLARGINE) as directed 30 units each morning HYZAAR 100-25 MG  TABS (LOSARTAN POTASSIUM-HCTZ) 1 by mouth once daily METOPROLOL SUCCINATE 25 MG TB24 (METOPROLOL SUCCINATE) take one tab  two times a day CLONIDINE HCL 0.1 MG TABS (CLONIDINE HCL) 1 tablet by mouth twice a day CRESTOR 20 MG TABS (ROSUVASTATIN CALCIUM) Take 1 tablet by mouth once a day LASIX 20 MG TABS (FUROSEMIDE) Take 1 tablet by mouth twice a day METFORMIN HCL 1000 MG TABS (METFORMIN HCL) Take 1.5  tablets by mouth in in the AM and 1 tablets in the PM NEXIUM 40 MG CPDR (ESOMEPRAZOLE MAGNESIUM) 1 capsule by mouth twice a day SINGULAIR 10 MG TABS (MONTELUKAST SODIUM) Take 1 tablet by mouth once a day SUCRALFATE 1 GM TABS (SUCRALFATE) Take 1 tablet by mouth four times a day NITROGLYCERIN 0.4 MG SUBL (NITROGLYCERIN) Place 1 tablet under tongue as directed ACYCLOVIR 800 MG TABS (ACYCLOVIR) Take 1 tabletb id/qd as directed ALBUTEROL SULFATE 1.25 MG/3ML NEBU (ALBUTEROL SULFATE) Inhale 1 vial as directed twice a day BAYER ASPIRIN 325 MG TABS (ASPIRIN) Take 1 tablet by mouth once a day * FLEXFOT CPAP MASK 407  * LANCETS FOR GLUCOMETER TESTING  NOVOLOG FLEXPEN 100 UNIT/ML  SOLN (INSULIN ASPART) 12 units prior to Breakfast 12 units prior to Evening Meal Supply one box of pens ZYRTEC ALLERGY 10 MG  TABS (CETIRIZINE HCL) 1 by mouth at bedtime QVAR 80 MCG/ACT  AERS (BECLOMETHASONE  DIPROPIONATE) 2 puffs twice daily XALATAN 0.005 %  SOLN (LATANOPROST) 1 drop each eye at bedtime * ALLERGY VACCINE GH BUILD-UP    Updated Prior Medication List: LANTUS SOLOSTAR 100 UNIT/ML  SOLN (INSULIN GLARGINE) as directed 30 units each morning HYZAAR 100-25 MG  TABS (LOSARTAN POTASSIUM-HCTZ) 1 by mouth once daily METOPROLOL SUCCINATE 25 MG TB24 (METOPROLOL SUCCINATE) take one tab  two times a day CLONIDINE HCL 0.1 MG TABS (CLONIDINE HCL) 1 tablet by mouth twice a day CRESTOR 20 MG TABS (ROSUVASTATIN CALCIUM) Take 1 tablet by mouth once a day LASIX 20 MG TABS (FUROSEMIDE) Take 1 tablet by mouth twice a day METFORMIN HCL 1000 MG TABS (METFORMIN HCL) Take 1.5  tablets by mouth in in the AM and 1 tablets in the PM NEXIUM 40 MG CPDR (ESOMEPRAZOLE MAGNESIUM) 1 capsule by mouth twice a day SINGULAIR 10 MG TABS (MONTELUKAST SODIUM) Take 1 tablet by mouth once a day SUCRALFATE 1 GM TABS (SUCRALFATE) Take 1 tablet by mouth four times a day NITROGLYCERIN 0.4 MG SUBL (NITROGLYCERIN) Place 1 tablet under tongue as directed ACYCLOVIR 800 MG TABS (ACYCLOVIR) Take 1 tabletb id/qd as directed ALBUTEROL SULFATE 1.25 MG/3ML NEBU (ALBUTEROL SULFATE) Inhale 1 vial as directed twice a day BAYER ASPIRIN 325 MG TABS (ASPIRIN) Take 1 tablet by mouth once a day * FLEXFOT CPAP MASK 407  * LANCETS FOR GLUCOMETER TESTING  NOVOLOG FLEXPEN 100 UNIT/ML  SOLN (INSULIN ASPART) 12 units  prior to Breakfast 12 units prior to Evening Meal Supply one box of pens ZYRTEC ALLERGY 10 MG  TABS (CETIRIZINE HCL) 1 by mouth at bedtime QVAR 80 MCG/ACT  AERS (BECLOMETHASONE DIPROPIONATE) 2 puffs twice daily XALATAN 0.005 %  SOLN (LATANOPROST) 1 drop each eye at bedtime * ALLERGY VACCINE GH BUILD-UP  PREDNISONE 10 MG  TABS (PREDNISONE) 4 tabs for 2 days, then 3 tabs for 2 days, 2 tabs for 2 days, then 1 tab for 2 days, then stop TESSALON 200 MG  CAPS (BENZONATATE) 1 by mouth three times a day as needed cough  Current Allergies  (reviewed today): DARVON (PROPOXYPHENE HCL) DIOVAN (VALSARTAN) FLAGYL (METRONIDAZOLE) METOPROLOL TARTRATE (METOPROLOL TARTRATE) NORVASC (AMLODIPINE BESYLATE) PRINZIDE (LISINOPRIL-HYDROCHLOROTHIAZIDE)  Past Medical History:    Reviewed history from 12/14/2007 and no changes required:       Current Problems:        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 of ASTHMA (ICD-493.90)       IRRITABLE BOWEL SYNDROME (ICD-564.1)       HYPERCHOLESTEROLEMIA (ICD-272.0)       GLAUCOMA (ICD-365.9)       DEPRESSION, MAJOR, RECURRENT (ICD-296.30)       CATARACT EXTRACTION STATUS (ICD-V45.61)                        Family History:    Reviewed history from 10/30/2006 and no changes required:       Father-, financial difficulties--receives meds from map [id number]), Moh[place]-diabetes, no family hx colon ca  Social History:    Reviewed history from 12/14/2007 and no changes required:       Lives with teenage son (h/o mental dz).  . Recently approved for section 8 housing (12/03)       Patient states former smoker.    Risk Factors: Tobacco use:  quit Exercise:  yes    Times per week:  3    Type:  biking  Colonoscopy History:    Date of Last Colonoscopy:  10/31/2005  Mammogram History:    Date of Last Mammogram:  04/13/2007   Review of Systems      See HPI   Vital Signs:  Patient Profile:   60 Years Old Female Height:     61.5 inches (156.21 cm) Weight:      186.8 pounds O2 Sat:      93 % O2 treatment:    Room Air Temp:     97.6 degrees F oral Pulse rate:   79 / minute BP sitting:   106 / 64  (left arm) Cuff size:   regular  Vitals Entered By: Michel Bickers CMA (February 24, 2008 11:27 AM)                  Physical Exam  GENERAL:  A/Ox3; pleasant & cooperative.NAD HEENT:  [place]/AT, EOM-wnl, PERRLA, EACs-clear, TMs-wnl, NOSE-clear, THROAT-clear & wnl. NECK:  Supple w/ fair ROM; no JVD; normal carotid impulses w/o bruits; no thyromegaly or nodules palpated; no lymphadenopathy. CHEST:  Clear to P & A; w/o, wheezes/ rales/  or rhonchi. HEART:  RRR, no m/r/g  heard ABDOMEN:  Soft & nt; nml bowel sounds; no organomegaly or masses detected. EXT: Warm bilat,  no calf pain, edema, clubbing, pulses intact Skin: no rash/lesion      Impression & Recommendations:  Problem # 1:  COUGH (ICD-786.2) Mild flare  Delsym 2 tsp every 12 hrs as needed cough Tessalon Perlses 200mg  three times a day as needed cough  Phenergan Codeine syrup 1-2 tsp every 4h as needed still coughing, may make you sleepy Prednisone taper over next week, monitor blood sugar closely, call for blood sugars over 250.  increase fluids sugarless candy to avoid throat clearing and cough, no mints.  Please contact office for sooner follow up if symptoms do not improve or worsen  Orders: Est. Patient Level III (21308)   Medications Added to Medication List This Visit: 1)  Prednisone 10 Mg Tabs (Prednisone) .... 4 tabs for 2 days, then 3 tabs for 2 days, 2 tabs for 2 days, then 1 tab for 2 days, then stop 2)  Tessalon 200 Mg Caps (Benzonatate) .[name].. 1 by mouth three times a day as needed cough   Patient Instructions: 1)  Delsym 2 tsp every 12 hrs as needed cough 2)  Tessalon Perlses 200mg  three times a day as needed cough  3)  Phenergan Codeine syrup 1-2 tsp every 4h as needed still coughing, may make you sleepy 4)  Prednisone taper over next week, monitor blood sugar closely, call for blood sugars over 250.  5)  increase fluids 6)  sugarless candy to avoid throat clearing and cough, no mints.  7)  Please contact office for sooner follow up if symptoms do not improve or worsen    Prescriptions: TESSALON 200 MG  CAPS  (BENZONATATE) 1 by mouth three times a day as needed cough  #45 x 3   Entered and Authorized by:   Rubye Oaks NP   Signed by:   Ivanna Kocak NP on 02/24/2008   Method used:   Print then Give to Patient   RxID:   [id number] PREDNISONE 10 MG  TABS (PREDNISONE) 4 tabs for 2 days, then 3 tabs for 2 days, 2 tabs for 2 days, then 1 tab for 2 days, then stop  #20 x 0   Entered and Authorized by:   Rubye Oaks NP   Signed by:   Roper Tolson NP on 02/24/2008   Method used:   Print then Give to Patient   RxID:   [id number]  ]

## 2010-10-02 NOTE — Assessment & Plan Note (Signed)
Summary: Rx clinic visit: Dm follow up   Vital Signs:  Patient Profile:   60 Years Old Female Height:     61.5 inches (156.21 cm) Weight:      181.6 pounds Pulse rate:   73 / minute BP sitting:   93 / 59                 PCP:  Denny Levy MD  Chief Complaint:  DM follow up.  History of Present Illness: 60 yo female presents for DM follow up.  She has lost 2 pounds since our last visit.  She is walking more than riding the bike.  She is doing ok with her diet.  She is eating soup, fruit, and fast food.  She does admit to skipping some meals.  She taking 30 units of Lantus daily and Novolog 12 units with each meal.  Her blood sugars are averaging in the 130 to 140 range.  She reports feeling bad today.  Her blood pressure is lower than usual.  Her blood glucose this morning at home was 141 mg/dl.  She took both the Lantus and Novolog.  She ate 2 boiled eggs and drank apple cider.  Her reading in clinic today is 60 mg/dl.  She received 20 grams of glucose and ate 2 peanut butter crackers.  Her recheck reading is 102 mg/dl.  This is her only report of a low blood sugar.    Diabetes Management History:      The patient is a 60 years old female who comes in for evaluation of DM Type 2.  She is (or has been) enrolled in the "Diabetic Education Program".  She states lack of understanding of dietary principles and is not following her diet appropriately.  She is checking home blood sugars.  She says that she is exercising.  Type of exercise includes: biking.  Duration of exercise is estimated to be 15 min.  She is doing this 3 times per week.        Her home fasting blood sugars are as follows: highest: 169; lowest: 80; average: 123.       Updated Prior Medication List: LANTUS SOLOSTAR 100 UNIT/ML  SOLN (INSULIN GLARGINE) as directed 30 units each morning HYZAAR 100-25 MG  TABS (LOSARTAN POTASSIUM-HCTZ) 1 by mouth once daily METOPROLOL SUCCINATE 25 MG TB24 (METOPROLOL SUCCINATE) take one tab   two times a day CLONIDINE HCL 0.1 MG TABS (CLONIDINE HCL) 1 tablet by mouth twice a day CRESTOR 20 MG TABS (ROSUVASTATIN CALCIUM) Take 1 tablet by mouth once a day LASIX 20 MG TABS (FUROSEMIDE) Take 1 tablet by mouth twice a day METFORMIN HCL 1000 MG TABS (METFORMIN HCL) Take 1.5  tablets by mouth in in the AM and 1 tablets in the PM NEXIUM 40 MG CPDR (ESOMEPRAZOLE MAGNESIUM) 1 capsule by mouth twice a day SINGULAIR 10 MG TABS (MONTELUKAST SODIUM) Take 1 tablet by mouth once a day SUCRALFATE 1 GM TABS (SUCRALFATE) Take 1 tablet by mouth four times a day NITROGLYCERIN 0.4 MG SUBL (NITROGLYCERIN) Place 1 tablet under tongue as directed ACYCLOVIR 800 MG TABS (ACYCLOVIR) Take 1 tabletb id/qd as directed ALBUTEROL SULFATE 1.25 MG/3ML NEBU (ALBUTEROL SULFATE) Inhale 1 vial as directed twice a day BAYER ASPIRIN 325 MG TABS (ASPIRIN) Take 1 tablet by mouth once a day * FLEXFOT CPAP MASK 407  * LANCETS FOR GLUCOMETER TESTING  NOVOLOG FLEXPEN 100 UNIT/ML  SOLN (INSULIN ASPART) 12 units prior to Breakfast 12 units prior to  Evening Meal Supply one box of pens ZYRTEC ALLERGY 10 MG  TABS (CETIRIZINE HCL) 1 by mouth at bedtime QVAR 80 MCG/ACT  AERS (BECLOMETHASONE DIPROPIONATE) 2 puffs twice daily XALATAN 0.005 %  SOLN (LATANOPROST) 1 drop each eye at bedtime * ALLERGY VACCINE GH BUILD-UP   Current Allergies: DARVON (PROPOXYPHENE HCL) DIOVAN (VALSARTAN) FLAGYL (METRONIDAZOLE) METOPROLOL TARTRATE (METOPROLOL TARTRATE) NORVASC (AMLODIPINE BESYLATE) PRINZIDE (LISINOPRIL-HYDROCHLOROTHIAZIDE)        Impression & Recommendations:  Problem # 1:  DIABETES MELLITUS II, UNCOMPLICATED (ICD-250.00) Assessment: Improved Her morning average blood sugar is 123 mg/dl.  Will continue Lantus 30 units a day and continue Novolog 12 units before meals.  Last a1c was 7.5%.  Her blood glucose reading was 60 mg/dl.  She drank 20 grams of glucose and ate peanut butter crackers.  Her recheck value was 102 mg/dl  .  Her  A1c today is 7.3%.  Follow up in pharmacy clinic in 1 to 2 months unless readings are below 80 mg/dl, then schedule a sooner appointment.   Her updated medication list for this problem includes:    Lantus Solostar 100 Unit/ml Soln (Insulin glargine) .[name]... As directed 30 units each morning    Hyzaar 100-25 Mg Tabs (Losartan potassium-hctz) .[name]... 1 by mouth once daily    Metformin Hcl 1000 Mg Tabs (Metformin hcl) .[name]... Take 1.5  tablets by mouth in in the am and 1 tablets in the pm    Bayer Aspirin 325 Mg Tabs (Aspirin) .[name]... Take 1 tablet by mouth once a day    Novolog Flexpen 100 Unit/ml Soln (Insulin aspart) .[name]... 12 units prior to breakfast 12 units prior to evening meal supply one box of pens  Orders: Glucose Cap-[facility] (35573) A1C-[facility] (22025) Reassessment Each 15 min unit- [facility] (42706)   Problem # 2:  HYPERTENSION, BENIGN SYSTEMIC (ICD-401.1) Assessment: Improved Blood pressure is low today but her readings at home are at the goal of less than 130/80.  Will continue current medications.  Her updated medication list for this problem includes:    Hyzaar 100-25 Mg Tabs (Losartan potassium-hctz) .[name]... 1 by mouth once daily    Metoprolol Succinate 25 Mg Tb24 (Metoprolol succinate) .[name]... Take one tab  two times a day    Clonidine Hcl 0.1 Mg Tabs (Clonidine hcl) .[name]... 1 tablet by mouth twice a day    Lasix 20 Mg Tabs (Furosemide) .[name]... Take 1 tablet by mouth twice a day  [id number]   Complete Medication List: 1)  Lantus Solostar 100 Unit/ml Soln (Insulin glargine) .... As directed 30 units each morning 2)  Hyzaar 100-25 Mg Tabs (Losartan potassium-hctz) .[name].. 1 by mouth once daily 3)  Metoprolol Succinate 25 Mg Tb24 (Metoprolol succinate) .... Take one tab  two times a day 4)  Clonidine Hcl 0.1 Mg Tabs (Clonidine hcl) .[name].. 1 tablet by mouth twice a day 5)  Crestor 20 Mg Tabs (Rosuvastatin calcium) .... Take 1 tablet by mouth once a day 6)  Lasix 20  Mg Tabs (Furosemide) .... Take 1 tablet by mouth twice a day 7)  Metformin Hcl 1000 Mg Tabs (Metformin hcl) .... Take 1.5  tablets by mouth in in the am and 1 tablets in the pm 8)  Nexium 40 Mg Cpdr (Esomeprazole magnesium) .[name].. 1 capsule by mouth twice a day 9)  Singulair 10 Mg Tabs (Montelukast sodium) .... Take 1 tablet by mouth once a day 10)  Sucralfate 1 Gm Tabs (Sucralfate) .... Take 1 tablet  by mouth four times a day 11)  Nitroglycerin 0.4 Mg Subl (Nitroglycerin) .... Place 1 tablet under tongue as directed 12)  Acyclovir 800 Mg Tabs (Acyclovir) .... Take 1 tabletb id/qd as directed 13)  Albuterol Sulfate 1.25 Mg/27ml Nebu (Albuterol sulfate) .... Inhale 1 vial as directed twice a day 14)  Bayer Aspirin 325 Mg Tabs (Aspirin) .... Take 1 tablet by mouth once a day 15)  Flexfot Cpap Mask 407  16)  Lancets For Glucometer Testing  17)  Novolog Flexpen 100 Unit/ml Soln (Insulin aspart) .[name].. 12 units prior to breakfast 12 units prior to evening meal supply one box of pens 18)  Zyrtec Allergy 10 Mg Tabs (Cetirizine hcl) .[name].. 1 by mouth at bedtime 19)  Qvar 80 Mcg/act Aers (Beclomethasone dipropionate) .... 2 puffs twice daily 20)  Xalatan 0.005 % Soln (Latanoprost) .[name].. 1 drop each eye at bedtime 21)  Allergy Vaccine Gh Build-up   Diabetes Management Assessment/Plan:      The following lipid goals have been established for the patient: Total cholesterol goal of 200; LDL cholesterol goal of 100; HDL cholesterol goal of 40; Triglyceride goal of 200.     Patient Instructions: 1)  Please schedule a follow-up appointment in 1 to 2 months. 2)  Check your blood sugars regularly. If your readings are usually above 300 or below 80 you should contact our office. 3)  It is important that your Diabetic A1c level is checked every 3 months.  Your value today was 7.3%. 4)  Continue Lantus 30 units a day.  Continue Novolog 12 units before each meal.   5)  Please go eat lunch after you leave here today.    6)  Please try to remember to check your blood sugar 2 times a day.   ]  Appended Document: CBG & A1c report     Lab Visit   Laboratory Results   Blood Tests   Date/Time Received: Jan 21, 2008 11:14  AM  Date/Time Reported: Jan 21, 2008 12:31 PM  Time patient last ate: 8:30 am  HGBA1C: 7.3%   (Normal Range: Non-Diabetic - 3-6%   Control Diabetic - 6-8%) CBG Random:: 60mg /dL  Comments: gave pt 2 oz glucola & 2 peanut butter/ritz crackers ...............test performed by......Marland KitchenBonnie A. [place], [place] (ASCP)     Orders Today:

## 2010-10-02 NOTE — Assessment & Plan Note (Signed)
Summary: f/u,df   Vital Signs:  Patient profile:   60 year old female Height:      61.5 inches Weight:      190.8 pounds BMI:     35.60 Temp:     97.9 degrees F oral Pulse rate:   71 / minute Pulse (ortho):   70 / minute BP sitting:   134 / 83  (left arm) BP standing:   130 / 80 Cuff size:   regular  Vitals Entered By: [name] [name] (Jan 17, 2010 9:23 AM) CC: F/U Is Patient Diabetic? Yes Did you bring your meter with you today? No Pain Assessment Patient in pain? no        Primary Care Provider:  Denny Levy MD  CC:  F/U.  History of Present Illness: f/u CTS and lnar neuropathy. She has seen ortho and needs some paperwork (referral) filled out  back and left side still hurt--pain 2-3/10 but constant. Worse if she lays on that side  Dixzziness---constant, feels unstable on her feet. No spinning sensation. Thinks it is her "inner ear" acting up. She sees allergist and he has stopped her zyrtec and she is continued on nasal spray.  Habits & Providers  Alcohol-Tobacco-Diet     Tobacco Status: never  Current Medications (verified): 1)  Novolog Mix 70/30 Flexpen 70-30 % Susp (Insulin Aspart Prot & Aspart) .... 28 Units Prior and Breakfast and 28 Units Prior To Evening Meal.  Dispense One Month Supply. 2)  Metformin Hcl 1000 Mg Tabs (Metformin Hcl) .... Take 1.5  Tablets By Mouth in in The Am and 1 Tablets in The Pm 3)  Potassium Chloride 40 Meq/72ml (20%) Liqd (Potassium Chloride) .[name].. 15 Cc Two Times A Day Disp Qs 3 M 4)  Hyzaar 100-25 Mg  Tabs (Losartan Potassium-Hctz) .[name].. 1 By Mouth Once Daily 5)  Lopressor 50 Mg Tabs (Metoprolol Tartrate) .... Two Times A Day 6)  Clonidine Hcl 0.1 Mg Tabs (Clonidine Hcl) .[name].. 1 Tablet By Mouth Twice A Day 7)  Lasix 20 Mg Tabs (Furosemide) .... Take 1 Tablet By Mouth Twice A Day 8)  Simvastatin 40 Mg Tabs (Simvastatin) .[name].. 1 By Mouth Once Daily 9)  Nexium 40 Mg Cpdr (Esomeprazole Magnesium) .[name].. 1 Capsule By Mouth Twice A Day 10)   Acyclovir 800 Mg Tabs (Acyclovir) .... Take 1 Tabletb Id/qd As Directed 11)  Lancets For Glucometer Testing 12)  Brovana 15 Mcg/31ml Nebu (Arformoterol Tartrate) .[name].. 1 Neb Every Twice Daily If Needed 13)  Bayer Aspirin 325 Mg Tabs (Aspirin) .... Take 1 Tablet By Mouth Once A Day 14)  Flexfot Cpap Mask 407 15)  Zyrtec Allergy 10 Mg  Tabs (Cetirizine Hcl) .[name].. 1 By Mouth At Bedtime On Hold Per Dr Maple Hudson 16)  Qvar 80 Mcg/act  Aers (Beclomethasone Dipropionate) .... 2 Puffs Twice Daily 17)  Xalatan 0.005 %  Soln (Latanoprost) .[name].. 1 Drop Each Eye At Bedtime 18)  Cpap 12 Advanced 19)  Allergy Vaccine Gh 1:10 20)  Trusopt 2 % Soln (Dorzolamide Hcl) .[name].. 1 Drop in Left Eye Twice Daily 21)  Astepro 137 Mcg/spray Soln (Azelastine Hcl) .... 2 Sprays Daily As Needed 22)  Singulair 10 Mg Tabs (Montelukast Sodium) .... One By Mouth Qd 23)  Zoloft 100 Mg Tabs (Sertraline Hcl) .[name].. 1 1/2 Tabs By Mouth Qd 24)  Gabapentin 300 Mg Caps (Gabapentin) .[name].. 1 By Mouth Bid 25)  Allopurinol 300 Mg Tabs (Allopurinol) .[name].. 1 By Mouth Qd 26)  Prednisone 20 Mg Tabs (Prednisone) .Marland KitchenMarland KitchenMarland Kitchen  1 By Mouth Once Daily For 5 Days  Allergies: 1)  Darvon (Propoxyphene Hcl) 2)  Diovan (Valsartan) 3)  Flagyl (Metronidazole) 4)  Norvasc (Amlodipine Besylate) 5)  Prinzide  Review of Systems       and as per hpi  Physical Exam  General:  alert.   Eyes:  resting nystagmus occasionally, legally blind Ears:  slight scarring B TM, no effusion Neck:  supple, full ROM, no masses, no thyromegaly, no JVD, and no carotid bruits.   Lungs:  normal respiratory effort, normal breath sounds, and no dullness.   Heart:  normal rate, regular rhythm, no murmur, and no gallop.   Msk:  left external oblique muscles seem tender with ne area of spasm and a trigger point identified  decreased soft touch sensation B hands, no thenar atrophy Upper and lower extremity strength is symmetrical and grossly normal. Extremities:  no edema Neurologic:   stands with cane--is a litle unsteady on her feet but walks independently with cane.alert & oriented X3.   Additional Exam:  Patient given informed consent for injection. Discussed possible complications of infection, bleeding or skin atrophy at site of injection. Possible side effect of avascular necrosis (focal area of bone death) due to steroid use.Appropriate verbal time out taken Are cleaned and prepped in usual sterile fashion. A --1/2-- cc kennalog plus ---1-cc 1% lidocaine without epinephrine was injected into thetrigger point of the left external oblique---. Patient tolerated procedure well with no complications.    Impression & Recommendations:  Problem # 1:  CARPAL TUNNEL SYNDROME, BILATERAL (ICD-354.0)  filled out paperwork for ortho referrals  Orders: [facility]- Est  Level 4 ([address])  Problem # 2:  BACK PAIN (ICD-724.5)  Her updated medication list for this problem includes:    Bayer Aspirin 325 Mg Tabs (Aspirin) .[name]... Take 1 tablet by mouth once a day trigger point injection  Orders: [facility]- Est  Level 4 ([address])  Problem # 3:  DIZZINESS (ICD-780.4)  same symptoms she had at last visit altho she is more steady on her feet. Given her allergies--could b component of labrynthitis. Already on nasal spray and seeing allergist. She has a lot of sensory problems and I think this contributes (rsting intermittent nystagmus, legal blindness, hearing loss). No orthostasis. We decided to try very sort burst of steroids to see if it would help any compnent of labrynthitis. She will rtc 3 m for reguar f/u and in interim if needed. She will also call me if new or worse signs.  Orders: [facility]- Est  Level 4 (99214)  Complete Medication List: 1)  Novolog Mix 70/30 Flexpen 70-30 % Susp (Insulin aspart prot & aspart) .... 28 units prior and breakfast and 28 units prior to evening meal.  dispense one month supply. 2)  Metformin Hcl 1000 Mg Tabs (Metformin hcl) .... Take 1.5  tablets by mouth in in the am  and 1 tablets in the pm 3)  Potassium Chloride 40 Meq/32ml (20%) Liqd (Potassium chloride) .[name].. 15 cc two times a day disp qs 3 m 4)  Hyzaar 100-25 Mg Tabs (Losartan potassium-hctz) .[name].. 1 by mouth once daily 5)  Lopressor 50 Mg Tabs (Metoprolol tartrate) .... Two times a day 6)  Clonidine Hcl 0.1 Mg Tabs (Clonidine hcl) .[name].. 1 tablet by mouth twice a day 7)  Lasix 20 Mg Tabs (Furosemide) .... Take 1 tablet by mouth twice a day 8)  Simvastatin 40 Mg Tabs (Simvastatin) .[name].. 1 by mouth once daily 9)  Nexium 40 Mg Cpdr (  Esomeprazole magnesium) .[name].. 1 capsule by mouth twice a day 10)  Acyclovir 800 Mg Tabs (Acyclovir) .... Take 1 tabletb id/qd as directed 11)  Lancets For Glucometer Testing  12)  Brovana 15 Mcg/63ml Nebu (Arformoterol tartrate) .[name].. 1 neb every twice daily if needed 13)  Bayer Aspirin 325 Mg Tabs (Aspirin) .... Take 1 tablet by mouth once a day 14)  Flexfot Cpap Mask 407  15)  Zyrtec Allergy 10 Mg Tabs (Cetirizine hcl) .[name].. 1 by mouth at bedtime on hold per dr young 16)  Qvar 80 Mcg/act Aers (Beclomethasone dipropionate) .... 2 puffs twice daily 17)  Xalatan 0.005 % Soln (Latanoprost) .[name].. 1 drop each eye at bedtime 18)  Cpap 12 Advanced  19)  Allergy Vaccine Gh 1:10  20)  Trusopt 2 % Soln (Dorzolamide hcl) .[name].. 1 drop in left eye twice daily 21)  Astepro 137 Mcg/spray Soln (Azelastine hcl) .... 2 sprays daily as needed 22)  Singulair 10 Mg Tabs (Montelukast sodium) .... One by mouth qd 23)  Zoloft 100 Mg Tabs (Sertraline hcl) .[name].. 1 1/2 tabs by mouth qd 24)  Gabapentin 300 Mg Caps (Gabapentin) .[name].. 1 by mouth bid 25)  Allopurinol 300 Mg Tabs (Allopurinol) .[name].. 1 by mouth qd 26)  Prednisone 20 Mg Tabs (Prednisone) .[name].. 1 by mouth once daily for 5 days  Other Orders: A1C-[facility] ([address]) Prescriptions: PREDNISONE 20 MG TABS (PREDNISONE) 1 by mouth once daily for 5 days  #5 x 0   Entered and Authorized by:   [name] Levy MD   Signed by:   [name] Levy MD on 01/17/2010   Method used:    Printed then faxed to ...       [facility] (retail)       [address]       [place], [place]  [address]       Ph: [phone]       Fax: [phone]   RxID:   [id number]   Laboratory Results   Blood Tests   Date/Time Received: Jan 17, 2010 9:36 AM  Date/Time Reported: Jan 17, 2010 9:52 AM   HGBA1C: 7.2%   (Normal Range: Non-Diabetic - 3-6%   Control Diabetic - 6-8%)  Comments: ...........test performed by...........Marland KitchenTerese Door, CMA

## 2010-10-02 NOTE — Consult Note (Signed)
Summary: [facility]   Imported By: Bradly Bienenstock 04/02/2010 12:04:59  _____________________________________________________________________  External Attachment:    Type:   Image     Comment:   External Document

## 2010-10-02 NOTE — Progress Notes (Signed)
Summary: referral  Phone Note From Other Clinic Call back at [phone]   Caller: Karen-Dr Hecker-Ophthalmology Summary of Call: is there for eye visit and needs a referral b/c she has [facility] - has been there in the past, but now needs the referral b/c of ins. she will need several visits. fax [phone] Initial call taken by: De Nurse,  September 20, 2009 11:53 AM  Follow-up for Phone Call        consulted with Dr. Jennette Kettle and she  ok referral . Referral  for PA faxed to  [facility]. Follow-up by: Theresia Lo RN,  September 21, 2009 11:06 AM  Additional Follow-up for Phone Call Additional follow up Details #1::        referral has been prior approved . # is [id number]. form from [facility] faxed to Dr. Elmer Picker 's office. approved for 3 visits. Additional Follow-up by: Theresia Lo RN,  September 27, 2009 11:16 AM

## 2010-10-04 NOTE — Assessment & Plan Note (Signed)
Summary: f/u visit/bmc   Vital Signs:  Patient profile:   60 year old female Height:      61 inches Weight:      178 pounds Temp:     98.2 degrees F oral Pulse rate:   98 / minute Pulse rhythm:   regular BP sitting:   130 / 79 Cuff size:   regular  Vitals Entered By: Loralee Pacas CMA (September 26, 2010 3:52 PM) CC: follow-up visit Is Patient Diabetic? Yes   Primary Care Provider:  Denny Levy MD  CC:  follow-up visit.  History of Present Illness: 1) continued isues with B hand weakness, burning pain, numbness and continued issues w left wrist weakness. current brace does notseem to hel  2) left knee pain---has had onjections in the past w some improvement---would like to try one again today  3) still not able to give herself her insulin as rx--hand issues. no episodes low blood sugar.  Allergies: 1)  Darvon 2)  Diovan (Valsartan) 3)  Flagyl (Metronidazole) 4)  Norvasc (Amlodipine Besylate) 5)  Prinzide  Social History: lives alone.   approved for section 8 housing (12/03) Patient states former smoker.  has son (autistic?) who stays with her at times  Review of Systems       Please see HPI for additional ROS.   Physical Exam  General:  alert, well-developed, well-nourished, and well-hydrated.   Eyes:  legally blind Neck:  supple, full ROM, no masses, no thyromegaly, and normal carotid upstroke.   Lungs:  normal breath sounds.   Heart:  normal rate and no murmur.   Msk:  poor grip strength B hands. poor dexterity fingers left.  Pulses:  DP and B radial pulses normal Neurologic:  decreased soft touch sensation B hands and feet. gat is a little unsteady--she is using cane today   Impression & Recommendations:  Problem # 1:  DEGENERATIVE JOINT DISEASE, KNEE (ICD-715.96) Orders: [facility]- Est  Level 4 ([address]) Injection, large joint- [facility] ([address])  Problem # 2:  DIABETES MELLITUS II, UNCOMPLICATED (ICD-250.00)  Orders: A1C-[facility] (83036)will check labs Comp Met-[facility]  ([id number]) [facility]- Est  Level 4 (82956)  Problem # 3:  PERIPHERAL NEUROPATHY (ICD-356.9) Orders: [facility]- Est  Level 4 (99214)increase her lyrica   Complete Medication List: 1)  Lantus Solostar 100 Unit/ml Soln (Insulin glargine) .... 40units subcutaneously daily disp qs 1 m in solostar pen this replaces 70-30 mix 2)  Metformin Hcl 1000 Mg Tabs (Metformin hcl) .... Take 1.5  tablets by mouth in in the am and 1 tablets in the pm 3)  Potassium Chloride 40 Meq/70ml (20%) Liqd (Potassium chloride) .[name].. 15 ml once daily 4)  Hyzaar 100-25 Mg Tabs (Losartan potassium-hctz) .[name].. 1 by mouth once daily 5)  Metoprolol Succinate 50 Mg Xr24h-tab (Metoprolol succinate) .... Take one tablet two times a day 6)  Lasix 20 Mg Tabs (Furosemide) .... Take 1 tablet by mouth daily 7)  Simvastatin 40 Mg Tabs (Simvastatin) .[name].. 1 by mouth once daily 8)  Nexium 40 Mg Cpdr (Esomeprazole magnesium) .[name].. 1 capsule by mouth twice a day 9)  Acyclovir 400 Mg Tabs (Acyclovir) .... Take 2 by mouth two times a day 10)  Lancets For Glucometer Testing  11)  Brovana 15 Mcg/8ml Nebu (Arformoterol tartrate) .[name].. 1 neb every twice daily if needed 12)  Bayer Aspirin 325 Mg Tabs (Aspirin) .... Take 1 tablet by mouth once a day 13)  Flexfot Cpap Mask 407  14)  Qvar 80 Mcg/act Aers (Beclomethasone dipropionate) .Marland KitchenMarland KitchenMarland Kitchen  2 puffs twice daily 15)  Xalatan 0.005 % Soln (Latanoprost) .[name].. 1 drop each eye at bedtime 16)  Cpap 12 Advanced  17)  Allergy Vaccine Gh 1:10  .... Every other week 18)  Alphagan P 0.1 % Soln (Brimonidine tartrate) .[name].. 1 drop in left eye two times a day 19)  Astepro 137 Mcg/spray Soln (Azelastine hcl) .... 2 sprays daily as needed 20)  Singulair 10 Mg Tabs (Montelukast sodium) .... One by mouth qd 21)  Zoloft 100 Mg Tabs (Sertraline hcl) .[name].. 1 1/2 tabs by mouth qd 22)  Allopurinol 300 Mg Tabs (Allopurinol) .[name].. 1 by mouth qd 23)  Diabetic Shoes  .... Patient with peripheral neuropathy and loss of sensation in feet,  some callouses 24)  Lyrica 150 Mg Caps (Pregabalin) .[name].. 1 by mouth two times a day 25)  Proair Hfa 108 (90 Base) Mcg/act Aers (Albuterol sulfate) .... 2 puffs every 4 hours as needed 26)  Flexeril 5 Mg Tabs (Cyclobenzaprine hcl) .[name].. 1 by mouth at bedtime as needed spasm  Other Orders: T-Lipoprotein (LDL cholesterol)  ([id number]) Prescriptions: LYRICA 150 MG CAPS (PREGABALIN) 1 by mouth two times a day  #60 x 5   Entered and Authorized by:   Denny Levy MD   Signed by:   Denny Levy MD on 09/28/2010   Method used:   Historical   RxID:   [id number]    Orders Added: 1)  T-Lipoprotein (LDL cholesterol)  [[id number]] 2)  A1C-[facility] [83036] 3)  Comp Met-[facility] [41324-40102] 4)  [facility]- Est  Level 4 [72536] 5)  Injection, large joint- [facility] [[address]]    Prevention & Chronic Care Immunizations   Influenza vaccine: Fluvax 3+  (06/05/2010)   Influenza vaccine deferral: Deferred  (05/21/2010)   Influenza vaccine due: 06/08/2009    Tetanus booster: 03/02/2008: given   Tetanus booster due: 03/02/2018    Pneumococcal vaccine: Done.  (06/02/1993)   Pneumococcal vaccine due: None  Colorectal Screening   Hemoccult: not indicated  (06/09/2008)   Hemoccult due: Not Indicated    Colonoscopy: Done.  (10/31/2005)   Colonoscopy due: 11/01/2015  Other Screening   Pap smear: Not documented   Pap smear due: Not Indicated    Mammogram: ASSESSMENT: Benign - BI-RADS 2^MM DIGITAL SCREENING  (04/18/2010)   Mammogram due: 04/14/2010   Smoking status: never  (08/17/2010)  Diabetes Mellitus   HgbA1C: 7.7  (09/26/2010)   HgbA1C action/deferral: Ordered  (06/28/2009)   Hemoglobin A1C due: 07/29/2008    Eye exam: Normal  (09/04/2009)    Foot exam: yes  (02/09/2010)   High risk foot: Not documented   Foot care education: Not documented    Urine microalbumin/creatinine ratio: Not documented   Urine microalbumin action/deferral: Not indicated    Diabetes flowsheet reviewed?: Yes   Progress  toward A1C goal: At goal  Lipids   Total Cholesterol: 93  (01/04/2009)   Lipid panel action/deferral: LDL Direct Ordered   LDL: 47  (01/04/2009)   LDL Direct: 55  (07/02/2007)   HDL: 30  (01/04/2009)   Triglycerides: 81  (01/04/2009)    SGOT (AST): 64  (12/28/2009)   SGPT (ALT): 54  (12/28/2009) CMP ordered    Alkaline phosphatase: 81  (12/28/2009)   Total bilirubin: 0.4  (12/28/2009)    Lipid flowsheet reviewed?: Yes   Progress toward LDL goal: Unchanged  Hypertension   Last Blood Pressure: 130 / 79  (09/26/2010)   Serum creatinine: 0.7  (05/31/2010)   Serum potassium 3.5  (05/31/2010) CMP ordered  Hypertension flowsheet reviewed?: Yes   Progress toward BP goal: At goal  Self-Management Support :   Personal Goals (by the next clinic visit) :     Personal A1C goal: 7  (04/07/2009)     Personal blood pressure goal: 130/80  (04/07/2009)     Personal LDL goal: 70  (04/07/2009)     Home glucose monitoring frequency: 1 time daily  (04/07/2009)    Diabetes self-management support: Written self-care plan  (05/21/2010)    Hypertension self-management support: Written self-care plan  (05/21/2010)    Hypertension self-management support not done because: Good outcomes  (04/07/2009)    Lipid self-management support: Written self-care plan  (05/21/2010)     Lipid self-management support not done because: Good outcomes  (04/07/2009)    Impression & Recommendations:  Her updated medication list for this problem includes:    Bayer Aspirin 325 Mg Tabs (Aspirin) .[name]... Take 1 tablet by mouth once a day    Flexeril 5 Mg Tabs (Cyclobenzaprine hcl) .[name]... 1 by mouth at bedtime as needed spasm   Orders: [facility]- Est  Level 4 ([address]) Injection, large joint- [facility] (20610)   Her updated medication list for this problem includes:    Bayer Aspirin 325 Mg Tabs (Aspirin) .[name]... Take 1 tablet by mouth once a day    Flexeril 5 Mg Tabs (Cyclobenzaprine hcl) .[name]... 1 by mouth at bedtime as  needed spasm   Her updated medication list for this problem includes:    Lantus Solostar 100 Unit/ml Soln (Insulin glargine) .Marland KitchenMarland KitchenMarland KitchenMarland Kitchen 40units subcutaneously daily disp qs 1 m in solostar pen this replaces 70-30 mix    Metformin Hcl 1000 Mg Tabs (Metformin hcl) .[name]... Take 1.5  tablets by mouth in in the am and 1 tablets in the pm    Hyzaar 100-25 Mg Tabs (Losartan potassium-hctz) .[name]... 1 by mouth once daily    Bayer Aspirin 325 Mg Tabs (Aspirin) .[name]... Take 1 tablet by mouth once a day   Orders: A1C-[facility] ([address]) Comp Met-[facility] ([id number]) [facility]- Est  Level 4 (29562)   Her updated medication list for this problem includes:    Lantus Solostar 100 Unit/ml Soln (Insulin glargine) .Marland KitchenMarland KitchenMarland KitchenMarland Kitchen 40units subcutaneously daily disp qs 1 m in solostar pen this replaces 70-30 mix    Metformin Hcl 1000 Mg Tabs (Metformin hcl) .[name]... Take 1.5  tablets by mouth in in the am and 1 tablets in the pm    Hyzaar 100-25 Mg Tabs (Losartan potassium-hctz) .[name]... 1 by mouth once daily    Bayer Aspirin 325 Mg Tabs (Aspirin) .[name]... Take 1 tablet by mouth once a day    Orders: [facility]- Est  Level 4 (13086)    Complete Medication List: 1)  Lantus Solostar 100 Unit/ml Soln (Insulin glargine) .... 40units subcutaneously daily disp qs 1 m in solostar pen this replaces 70-30 mix 2)  Metformin Hcl 1000 Mg Tabs (Metformin hcl) .... Take 1.5  tablets by mouth in in the am and 1 tablets in the pm 3)  Potassium Chloride 40 Meq/13ml (20%) Liqd (Potassium chloride) .[name].. 15 ml once daily 4)  Hyzaar 100-25 Mg Tabs (Losartan potassium-hctz) .[name].. 1 by mouth once daily 5)  Metoprolol Succinate 50 Mg Xr24h-tab (Metoprolol succinate) .... Take one tablet two times a day 6)  Lasix 20 Mg Tabs (Furosemide) .... Take 1 tablet by mouth daily 7)  Simvastatin 40 Mg Tabs (Simvastatin) .[name].. 1 by mouth once daily 8)  Nexium 40 Mg Cpdr (Esomeprazole magnesium) .[name].. 1 capsule by mouth twice a  day 9)  Acyclovir 400 Mg Tabs (Acyclovir) .... Take 2 by mouth  two times a day 10)  Lancets For Glucometer Testing  11)  Brovana 15 Mcg/7ml Nebu (Arformoterol tartrate) .[name].. 1 neb every twice daily if needed 12)  Bayer Aspirin 325 Mg Tabs (Aspirin) .... Take 1 tablet by mouth once a day 13)  Flexfot Cpap Mask 407  14)  Qvar 80 Mcg/act Aers (Beclomethasone dipropionate) .... 2 puffs twice daily 15)  Xalatan 0.005 % Soln (Latanoprost) .[name].. 1 drop each eye at bedtime 16)  Cpap 12 Advanced  17)  Allergy Vaccine Gh 1:10  .... Every other week 18)  Alphagan P 0.1 % Soln (Brimonidine tartrate) .[name].. 1 drop in left eye two times a day 19)  Astepro 137 Mcg/spray Soln (Azelastine hcl) .... 2 sprays daily as needed 20)  Singulair 10 Mg Tabs (Montelukast sodium) .... One by mouth qd 21)  Zoloft 100 Mg Tabs (Sertraline hcl) .[name].. 1 1/2 tabs by mouth qd 22)  Allopurinol 300 Mg Tabs (Allopurinol) .[name].. 1 by mouth qd 23)  Diabetic Shoes  .... Patient with peripheral neuropathy and loss of sensation in feet, some callouses 24)  Lyrica 150 Mg Caps (Pregabalin) .[name].. 1 by mouth two times a day 25)  Proair Hfa 108 (90 Base) Mcg/act Aers (Albuterol sulfate) .... 2 puffs every 4 hours as needed 26)  Flexeril 5 Mg Tabs (Cyclobenzaprine hcl) .[name].. 1 by mouth at bedtime as needed spasm  Other Orders: T-Lipoprotein (LDL cholesterol)  ([id number])   Laboratory Results   Blood Tests   Date/Time Received: September 26, 2010 4:46 PM  Date/Time Reported: September 26, 2010 5:35 PM   HGBA1C: 7.7%   (Normal Range: Non-Diabetic - 3-6%   Control Diabetic - 6-8%)  Comments: ...............test performed by......Marland KitchenBonnie A. [place], MLS (ASCP)cm

## 2010-10-04 NOTE — Assessment & Plan Note (Signed)
Summary: 1 month return/mhh   Primary Provider/Referring Provider:  Denny Levy MD  CC:  1 month follow up visit-allergies; having ringing in ears all the time.[name]  History of Present Illness:  July 12, 2010- Allergic rhinitis ,Asthma, OSA Nurse-CC: 6 month follow up visit-allergies. Somedays still notes chest tightness and wheeze. Notes waking with wheeze and nasal congestion especially if weather cold or rainey. Uses Qvar daily. Neb Brovana about 3x/ week, Proair 1-2x/ week. Rarely awakened in night by breathing. She is convinced allergy shots have helped her. Feeling stable with vaccine at 1:10 and would like to extend interval now to qow.  Sleep apnea- uses CPAP only if really tired. Sleeps alone- no reporter. . She suggests we retest her. Has to sleep with CPAP and also with arm brace and feels this is sometimes too much equipment to fool with. Bedtime 10-11PM, latency 15-30 minutes, up 630-730AM.   August 17, 2010-  Allergic rhinitis ,Asthma, OSA Nurse-CC: 1 month follow up visit-allergies; having ringing in ears all the time. She feels a little unsteady, variable. No falls. Directed to Dr Jennette Kettle ? ENT eval for vertigo. Dyspnea on hills and stairs- no cough or wheeze. NPSG- 07/19/10- AHI 7.3 and PLMI 4.2/ hr. Mild OSA. She is trying to wear CPAP, but admits she feels better if she leaves it off.  PFT- weak effort with moderate reduction of flow and volume CT chest in April reviewed from time of a fall, looking only for trauma.     Preventive Screening-Counseling & Management  Alcohol-Tobacco     Smoking Status: never     Tobacco Counseling: not indicated; no tobacco use  Current Medications (verified): 1)  Lantus Solostar 100 Unit/ml Soln (Insulin Glargine) .... 40units Subcutaneously Daily Disp Qs 1 M in Solostar Pen This Replaces 70-30 Mix 2)  Metformin Hcl 1000 Mg Tabs (Metformin Hcl) .... Take 1.5  Tablets By Mouth in in The Am and 1 Tablets in The Pm 3)  Potassium  Chloride 40 Meq/53ml (20%) Liqd (Potassium Chloride) .[name].. 15 Ml Once Daily 4)  Hyzaar 100-25 Mg  Tabs (Losartan Potassium-Hctz) .[name].. 1 By Mouth Once Daily 5)  Metoprolol Succinate 50 Mg Xr24h-Tab (Metoprolol Succinate) .... Take One Tablet Two Times A Day 6)  Lasix 20 Mg Tabs (Furosemide) .... Take 1 Tablet By Mouth Daily 7)  Simvastatin 40 Mg Tabs (Simvastatin) .[name].. 1 By Mouth Once Daily 8)  Nexium 40 Mg Cpdr (Esomeprazole Magnesium) .[name].. 1 Capsule By Mouth Twice A Day 9)  Acyclovir 400 Mg Tabs (Acyclovir) .... Take 1 By Mouth Two Times A Day 10)  Lancets For Glucometer Testing 11)  Brovana 15 Mcg/7ml Nebu (Arformoterol Tartrate) .[name].. 1 Neb Every Twice Daily If Needed 12)  Bayer Aspirin 325 Mg Tabs (Aspirin) .... Take 1 Tablet By Mouth Once A Day 13)  Flexfot Cpap Mask 407 14)  Qvar 80 Mcg/act  Aers (Beclomethasone Dipropionate) .... 2 Puffs Twice Daily 15)  Xalatan 0.005 %  Soln (Latanoprost) .[name].. 1 Drop Each Eye At Bedtime 16)  Cpap 12 Advanced 17)  Allergy Vaccine Gh 1:10 .... Every Other Week 18)  Alphagan P 0.1 % Soln (Brimonidine Tartrate) .[name].. 1 Drop in Left Eye Two Times A Day 19)  Astepro 137 Mcg/spray Soln (Azelastine Hcl) .... 2 Sprays Daily As Needed 20)  Singulair 10 Mg Tabs (Montelukast Sodium) .... One By Mouth Qd 21)  Zoloft 100 Mg Tabs (Sertraline Hcl) .[name].. 1 1/2 Tabs By Mouth Qd 22)  Allopurinol 300 Mg Tabs (Allopurinol) .Marland KitchenMarland KitchenMarland Kitchen  1 By Mouth Qd 23)  Diabetic Shoes .... Patient With Peripheral Neuropathy and Loss of Sensation in Feet, Some Callouses 24)  Lyrica 50 Mg Caps (Pregabalin) .... Two By Mouth Am, One At Mid Day and Two By Mouth Qhs120 25)  Proair Hfa 108 (90 Base) Mcg/act Aers (Albuterol Sulfate) .... 2 Puffs Every 4 Hours As Needed 26)  Flexeril 5 Mg Tabs (Cyclobenzaprine Hcl) .[name].. 1 By Mouth At Bedtime As Needed Spasm  Allergies (verified): 1)  Darvon 2)  Diovan (Valsartan) 3)  Flagyl (Metronidazole) 4)  Norvasc (Amlodipine Besylate) 5)  Prinzide  Past  History:  Past Medical History: Last updated: 07/12/2010 chronic cough--now followed by pulmonary moderate sleep apnea--cpap 12 Hypertension Dyslipidemia Diabetes Gout  ?hx vocal cord paresis normal 2008  Past Surgical History: Last updated: 05/31/2010 Anal fistula repair -,  Arthroscopy--R shoulder/R knee 12/01 - 10/07/2000,  breast reduction - 04/21/2003,  BTL--`87 -,  cardiac Cath--nl ef, no cad - 10/04/2003, echo--tricus regurg, mild pul htn, ef 60 - 03/02/2004,  ENT Naso-pharyn scope-normal - 09/11/2006,  Hysterectomy - 10/07/2000, l . rotator cuff surgery - 10/18/2003,  T-score: -.0.54 (low risk currently) - 09/02/2004 bronchoscopy for chronic cough 07/2007 Release of volar carpal ligament-2011  Family History: Last updated: 10/30/2006 Father-, financial difficulties--receives meds from [facility] [phone]), Mohter-diabetes, no family hx colon ca  Social History: Last updated: 05/02/2010 lives alone.   approved for section 8 housing (12/03) Patient states former smoker.   Risk Factors: Exercise: yes (07/23/2007)  Risk Factors: Smoking Status: never (08/17/2010)  Review of Systems      See HPI       The patient complains of shortness of breath with activity and non-productive cough.  The patient denies shortness of breath at rest, productive cough, coughing up blood, chest pain, irregular heartbeats, acid heartburn, indigestion, loss of appetite, weight change, abdominal pain, difficulty swallowing, sore throat, tooth/dental problems, headaches, nasal congestion/difficulty breathing through nose, sneezing, itching, ear ache, hand/feet swelling, rash, change in color of mucus, and fever.    Vital Signs:  Patient profile:   60 year old female Height:      61 inches Weight:      184.31 pounds BMI:     34.95 O2 Sat:      97 % on Room air Pulse rate:   76 / minute BP sitting:   118 / 70  (left arm) Cuff size:   regular  Vitals Entered By: Reynaldo Minium CMA (August 17, 2010  2:13 PM)  O2 Flow:  Room air CC: 1 month follow up visit-allergies; having ringing in ears all the time.   Physical Exam  Additional Exam:  General: A/Ox3; pleasant and cooperative, NAD, alert, overweight, sat 97% room air at rest SKIN: no rash, lesions NODES: no lymphadenopathy HEENT: [place]/AT, EOM- strabismus, Conjuctivae- clear, PERRLA, limitied eye sight, squints with right, TM-WNL, Nose- clear, Throat- clear and wnl, .Mallampati  II, tongue stained yellow by something she was drinking. NECK: Supple w/ fair ROM, JVD- none, normal carotid impulses w/o bruits Thyroid- normal to palpation CHEST: Clear to P&A, no wheeze or cough today, diminished HEART: RRR, 2/6 syst murmur aortic space ABDOMEN: Soft  YQM:VHQI, nl pulses, no edema  NEURO: Grossly intact to observation      Impression & Recommendations:  Problem # 1:  OBSTRUCTIVE SLEEP APNEA (ICD-327.23)  Very mild OSA. She is going to stop CPAP for now and see if she feels better w/o.   Problem # 2:  ? of ASTHMA, INTERMITTENT (  ICD-493.90) PFT is compromised by her effort, so I can't completely exclude asthma.  She continues Qvar, neb, Proair Singulair and allergy vaccine. We will watch over time for opportunity to reduce meds. she is encouraged to walk when she can to maintain some fitness.   Other Orders: Est. Patient Level IV (99214) T-2 View CXR (71020TC)  Patient Instructions: 1)  Please schedule a follow-up appointment in 4 months. 2)  OK to stop CPAP for now. You can restart it as needed. 3)  Continue your asthma medicines

## 2010-10-04 NOTE — Consult Note (Signed)
Summary: [facility]  [facility]   Imported By: Bradly Bienenstock 08/31/2010 12:08:33  _____________________________________________________________________  External Attachment:    Type:   Image     Comment:   External Document

## 2010-10-04 NOTE — Assessment & Plan Note (Signed)
Summary: ov bmc   Vital Signs:  Patient profile:   60 year old female BP sitting:   155 / 80  Vitals Entered By: Lillia Pauls CMA (August 10, 2010 10:44 AM)  Primary Care Provider:  Denny Levy MD   History of Present Illness: B lower extrimty burning is worse Was not able to increase the lyrica as we had planned--some problem with the rx  2) received her new diabetic shoes and they are a bit too large.  3) brace on left wrist where she had CTS is irritating her wrist. She has gotten her walker padded and that helps some.  4) some chronic back pain has been a littlke worse--has used some muscle relaxers in past.   Current Medications (verified): 1)  Lantus Solostar 100 Unit/ml Soln (Insulin Glargine) .... 40units Subcutaneously Daily Disp Qs 1 M in Solostar Pen This Replaces 70-30 Mix 2)  Metformin Hcl 1000 Mg Tabs (Metformin Hcl) .... Take 1.5  Tablets By Mouth in in The Am and 1 Tablets in The Pm 3)  Potassium Chloride 40 Meq/73ml (20%) Liqd (Potassium Chloride) .[name].. 15 Ml Once Daily 4)  Hyzaar 100-25 Mg  Tabs (Losartan Potassium-Hctz) .[name].. 1 By Mouth Once Daily 5)  Metoprolol Succinate 50 Mg Xr24h-Tab (Metoprolol Succinate) .... Take One Tablet Two Times A Day 6)  Lasix 20 Mg Tabs (Furosemide) .... Take 1 Tablet By Mouth Daily 7)  Simvastatin 40 Mg Tabs (Simvastatin) .[name].. 1 By Mouth Once Daily 8)  Nexium 40 Mg Cpdr (Esomeprazole Magnesium) .[name].. 1 Capsule By Mouth Twice A Day 9)  Acyclovir 400 Mg Tabs (Acyclovir) .... Take 1 By Mouth Two Times A Day 10)  Lancets For Glucometer Testing 11)  Brovana 15 Mcg/8ml Nebu (Arformoterol Tartrate) .[name].. 1 Neb Every Twice Daily If Needed 12)  Bayer Aspirin 325 Mg Tabs (Aspirin) .... Take 1 Tablet By Mouth Once A Day 13)  Flexfot Cpap Mask 407 14)  Qvar 80 Mcg/act  Aers (Beclomethasone Dipropionate) .... 2 Puffs Twice Daily 15)  Xalatan 0.005 %  Soln (Latanoprost) .[name].. 1 Drop Each Eye At Bedtime 16)  Cpap 12 Advanced 17)  Allergy Vaccine  Gh 1:10 .... Every Other Week 18)  Alphagan P 0.1 % Soln (Brimonidine Tartrate) .[name].. 1 Drop in Left Eye Two Times A Day 19)  Astepro 137 Mcg/spray Soln (Azelastine Hcl) .... 2 Sprays Daily As Needed 20)  Singulair 10 Mg Tabs (Montelukast Sodium) .... One By Mouth Qd 21)  Zoloft 100 Mg Tabs (Sertraline Hcl) .[name].. 1 1/2 Tabs By Mouth Qd 22)  Allopurinol 300 Mg Tabs (Allopurinol) .[name].. 1 By Mouth Qd 23)  Diabetic Shoes .... Patient With Peripheral Neuropathy and Loss of Sensation in Feet, Some Callouses 24)  Fluconazole 100 Mg Tabs (Fluconazole) .[name].. 1 By Mouth Every Other Day For Three Doses 25)  Lyrica 50 Mg Caps (Pregabalin) .... Two By Mouth Am, One At Mid Day and Two By Mouth Qhs120 26)  Nortriptyline Hcl 25 Mg Caps (Nortriptyline Hcl) .... One By Mouth Q Hs 27)  Proair Hfa 108 (90 Base) Mcg/act Aers (Albuterol Sulfate) .... 2 Puffs Every 4 Hours As Needed 28)  Flexeril 5 Mg Tabs (Cyclobenzaprine Hcl) .[name].. 1 By Mouth At Bedtime As Needed Spasm  Allergies: 1)  Darvon 2)  Diovan (Valsartan) 3)  Flagyl (Metronidazole) 4)  Norvasc (Amlodipine Besylate) 5)  Prinzide  Social History: Reviewed history from 05/02/2010 and no changes required. lives alone.   approved for section 8 housing (12/03) Patient states  former smoker.   Review of Systems  The patient denies anorexia, fever, weight loss, weight gain, chest pain, dyspnea on exertion, and peripheral edema.    Physical Exam  General:  alert and well-developed.   Eyes:  legally blind Msk:  LEft hand has well healed CTS incision.  Grip strength and fine motor control in B hands is poor.  FEET no calous Neurologic:  B LE and hands have decreased sensory loss to soft touch     Impression & Recommendations:  Problem # 1:  WRIST PAIN (ZOX-096.04)  Orders: Splint Wrist (V4098) we gave her a new wrist splint, modified it with some special padding and deceased the angle of cock up. She finds this more comfortable  Problem # 2:   PERIPHERAL NEUROPATHY (ICD-356.9) for her shoes I added some sports insoles that were custom fitted to her shoe. I called in the rx again for her increased dose of lyrica back pain--refilled her flexeril--we discussed usingthis minmally as she already has many sensory losses and problems with gait. She is in agreement  Complete Medication List: 1)  Lantus Solostar 100 Unit/ml Soln (Insulin glargine) .... 40units subcutaneously daily disp qs 1 m in solostar pen this replaces 70-30 mix 2)  Metformin Hcl 1000 Mg Tabs (Metformin hcl) .... Take 1.5  tablets by mouth in in the am and 1 tablets in the pm 3)  Potassium Chloride 40 Meq/65ml (20%) Liqd (Potassium chloride) .[name].. 15 ml once daily 4)  Hyzaar 100-25 Mg Tabs (Losartan potassium-hctz) .[name].. 1 by mouth once daily 5)  Metoprolol Succinate 50 Mg Xr24h-tab (Metoprolol succinate) .... Take one tablet two times a day 6)  Lasix 20 Mg Tabs (Furosemide) .... Take 1 tablet by mouth daily 7)  Simvastatin 40 Mg Tabs (Simvastatin) .[name].. 1 by mouth once daily 8)  Nexium 40 Mg Cpdr (Esomeprazole magnesium) .[name].. 1 capsule by mouth twice a day 9)  Acyclovir 400 Mg Tabs (Acyclovir) .... Take 1 by mouth two times a day 10)  Lancets For Glucometer Testing  11)  Brovana 15 Mcg/51ml Nebu (Arformoterol tartrate) .[name].. 1 neb every twice daily if needed 12)  Bayer Aspirin 325 Mg Tabs (Aspirin) .... Take 1 tablet by mouth once a day 13)  Flexfot Cpap Mask 407  14)  Qvar 80 Mcg/act Aers (Beclomethasone dipropionate) .... 2 puffs twice daily 15)  Xalatan 0.005 % Soln (Latanoprost) .[name].. 1 drop each eye at bedtime 16)  Cpap 12 Advanced  17)  Allergy Vaccine Gh 1:10  .... Every other week 18)  Alphagan P 0.1 % Soln (Brimonidine tartrate) .[name].. 1 drop in left eye two times a day 19)  Astepro 137 Mcg/spray Soln (Azelastine hcl) .... 2 sprays daily as needed 20)  Singulair 10 Mg Tabs (Montelukast sodium) .... One by mouth qd 21)  Zoloft 100 Mg Tabs (Sertraline hcl) .[name].. 1 1/2  tabs by mouth qd 22)  Allopurinol 300 Mg Tabs (Allopurinol) .[name].. 1 by mouth qd 23)  Diabetic Shoes  .... Patient with peripheral neuropathy and loss of sensation in feet, some callouses 24)  Fluconazole 100 Mg Tabs (Fluconazole) .[name].. 1 by mouth every other day for three doses 25)  Lyrica 50 Mg Caps (Pregabalin) .... Two by mouth am, one at mid day and two by mouth qhs120 26)  Nortriptyline Hcl 25 Mg Caps (Nortriptyline hcl) .... One by mouth q hs 27)  Proair Hfa 108 (90 Base) Mcg/act Aers (Albuterol sulfate) .... 2 puffs every 4 hours as needed 28)  Flexeril  5 Mg Tabs (Cyclobenzaprine hcl) .[name].. 1 by mouth at bedtime as needed spasm Prescriptions: FLEXERIL 5 MG TABS (CYCLOBENZAPRINE HCL) 1 by mouth at bedtime as needed spasm  #30 x 12   Entered and Authorized by:   Denny Levy MD   Signed by:   Denny Levy MD on 08/10/2010   Method used:   Printed then mailed to ...       [facility] (retail)       [address]       [place], [place]  [address]       Ph: [phone]       Fax: [phone]   RxID:   [id number] LYRICA 50 MG CAPS (PREGABALIN) two by mouth am, one at mid day and two by mouth qhs120  #150 x 6   Entered by:   Lillia Pauls CMA   Authorized by:   Denny Levy MD   Signed by:   Lillia Pauls CMA on 08/10/2010   Method used:   Telephoned to ...       [facility] (retail)       [address]       [place], [place]  [address]       Ph: [phone]       Fax: [phone]   RxID:   [id number] LYRICA 50 MG CAPS (PREGABALIN) two by mouth am, one at mid day and two by mouth qhs120  #150 x 6   Entered and Authorized by:   Denny Levy MD   Signed by:   Denny Levy MD on 08/10/2010   Method used:   Print then Give to Patient   RxID:   [id number]    Orders Added: 1)  Splint Wrist [L3908] 2)  Est. Patient Level IV [50093]

## 2010-10-05 ENCOUNTER — Telehealth (INDEPENDENT_AMBULATORY_CARE_PROVIDER_SITE_OTHER): Payer: Self-pay | Admitting: *Deleted

## 2010-10-05 NOTE — Miscellaneous (Signed)
Summary: Order for CPAP Equipment/[facility]  Order for CPAP Equipment/[facility]   Imported By: Esmeralda Links D'jimraou 03/01/2008 11:33:44  _____________________________________________________________________  External Attachment:    Type:   Image     Comment:   External Document

## 2010-10-10 NOTE — Progress Notes (Signed)
Summary: Returning call  Phone Note Call from Patient Call back at [facility] Phone [phone]   Summary of Call: pt is returning someone's call, thinks it is re: her recent labs.  Initial call taken by: Knox Royalty,  October 01, 2010 9:20 AM  Follow-up for Phone Call        No I did not call her about her labs--they were FINE--except her cholesterol wa up a little--we cvan discuss that when I see her nextI am sending a letter with details. I have no idea who called her Thanks!  Denny Levy MD  October 01, 2010 10:06 AM'  Additional Follow-up for Phone Call Additional follow up Details #1::        spokewith pt. and informed her of labs and that she will be receiving a letter in the mail explaining this Additional Follow-up by: Jimmy Footman, CMA,  October 01, 2010 10:59 AM

## 2010-10-10 NOTE — Letter (Signed)
Summary: Generic Letter  [facility]  [address]   [place], [place] [address]   Phone: [phone]  Fax: [phone]    10/01/2010  [name] [name] [address] [place], [place]  [address]  To Whom it May Concern [name] needs a shower seat and support bars for the bathtub to prevent falls. She has diagoses of 1) legally blind, Ostoarthritis, and 3(dizziness which predisposes he to falls.           Sincerely,   Denny Levy MD  Appended Document: Generic Letter mailed  Appended Document: Generic Letter also faxed to number provided to [facility]

## 2010-10-10 NOTE — Progress Notes (Signed)
Summary: needs letter-asap  Phone Note Call from Patient Call back at Home Phone [phone]   Caller: Patient Summary of Call: pt moved to new apt and needs letter stating that she needs grab bars & shower seat for her tub. needs to be addressed to [facility] fax to [phone] Initial call taken by: De Nurse,  October 01, 2010 11:04 AM    admin I have routed this letter to you. Do we have her NEW address or a fax number to send it to? Thanks!  Denny Levy MD  October 01, 2010 11:54 AM   Faxed to above # ............................................... Delora Fuel October 03, 2010 9:47 AM

## 2010-10-10 NOTE — Letter (Signed)
Summary: Generic Letter  [facility]  [address]   [place], [place] [address]   Phone: [phone]  Fax: [phone]    10/01/2010  [facility] [address] [place], [place]  [address]  Dear Ms. Hart,  All of your labs including blood sugar, electrolytes, kidney and liver function were OK. Your LDL cholesterol was 151--I would lik eitless than 100. You are on simvastatin for that--when I see you next we can discuss otehr options.  Have a great day!         Sincerely,   Denny Levy MD  Appended Document: Generic Letter PS A1C was 7.7% PERFECT

## 2010-10-10 NOTE — Progress Notes (Signed)
  EMSI request received sent to [facility]  October 05, 2010 11:53 AM

## 2010-10-25 ENCOUNTER — Ambulatory Visit (INDEPENDENT_AMBULATORY_CARE_PROVIDER_SITE_OTHER): Payer: No Typology Code available for payment source

## 2010-10-25 DIAGNOSIS — J301 Allergic rhinitis due to pollen: Secondary | ICD-10-CM

## 2010-11-07 ENCOUNTER — Ambulatory Visit (INDEPENDENT_AMBULATORY_CARE_PROVIDER_SITE_OTHER): Payer: No Typology Code available for payment source | Admitting: Family Medicine

## 2010-11-07 ENCOUNTER — Encounter: Payer: Self-pay | Admitting: Family Medicine

## 2010-11-07 DIAGNOSIS — G609 Hereditary and idiopathic neuropathy, unspecified: Secondary | ICD-10-CM

## 2010-11-07 DIAGNOSIS — G629 Polyneuropathy, unspecified: Secondary | ICD-10-CM

## 2010-11-07 DIAGNOSIS — E119 Type 2 diabetes mellitus without complications: Secondary | ICD-10-CM

## 2010-11-07 MED ORDER — PREGABALIN 50 MG PO CAPS: ORAL_CAPSULE | ORAL | Status: DC

## 2010-11-07 MED ORDER — PREGABALIN 100 MG PO CAPS: ORAL_CAPSULE | ORAL | Status: DC

## 2010-11-08 DIAGNOSIS — G629 Polyneuropathy, unspecified: Secondary | ICD-10-CM | POA: Insufficient documentation

## 2010-11-08 NOTE — Assessment & Plan Note (Signed)
Will try again to increase her lyrica to 300 mg a day

## 2010-11-08 NOTE — Assessment & Plan Note (Addendum)
Difficult situation with her inability to use her hands--we have exhausted outside resources--we have maximized her regimen to a single dose of flex pen. Will make no further changes now and hope she regains further use of hands--she does NOT want to consider assisted living type situation Recheck a1c next visit  Also cut her toenails today

## 2010-11-08 NOTE — Progress Notes (Signed)
  Subjective:    Patient ID: [name] [name], female    DOB: [birth year], 60 y.o.   MRN: [id number]  HPI  1. Peripheral neuropathy worse--all the way up to her hisp now w burning pain whether she is standing, sitting or lying. Worst pain is still in her feet and hands. Also ghaving cramps inher hands. Was not able to getthe new dosing of lyrica (? Reason) having a lot of difficulty with her hands not working so many days she is unable to give herself insulin which significantly affects her 2) DM: no low sugars but many > 150 and she is unhappy with this 3) unable to do foot care and toenails very long, worried she will get foot infection  Review of Systems  Constitutional: Positive for activity change. Negative for fever, fatigue and unexpected weight change.  Respiratory: Negative for cough, shortness of breath and wheezing.   Cardiovascular: Negative for chest pain and leg swelling.  Gastrointestinal: Negative for abdominal pain.  Neurological: Positive for numbness.  Psychiatric/Behavioral: Negative for confusion, dysphoric mood and agitation. The patient is not nervous/anxious.        Objective:   Physical Exam GENERALl: Well developed, well nourished, in no acute distress. NECK: Supple, FROM, without lymphadenopathy.  THYROID: normal without nodularity LUNGS: clear to auscultation bilaterally. No wheezes or rales. HEART: Regular rate and rhythm, no murmurs ABDOMEN: soft with positive bowel sounds NEURO:legally blind, horizontal nystagmus. Decreased  sensation to soft touch sotcking glove distribution with significant hyperesthesia toes and fingers. GAIT: unsteady--using cnae. MSK difficulty rising from seated position--uses cane for assistance. NAILS: dystrophic thickened curved nails all ten toes. No sign of infection but there is beginning if some mild breakdown on one toe         Assessment & Plan:

## 2010-11-18 LAB — CBC
HCT: 35.6 % — ABNORMAL LOW (ref 36.0–46.0)
Hemoglobin: 11.7 g/dL — ABNORMAL LOW (ref 12.0–15.0)
MCHC: 32.8 g/dL (ref 30.0–36.0)
MCV: 88.6 fL (ref 78.0–100.0)
Platelets: 233 10*3/uL (ref 150–400)
RBC: 4.02 MIL/uL (ref 3.87–5.11)
RDW: 15.9 % — ABNORMAL HIGH (ref 11.5–15.5)
WBC: 9.3 10*3/uL (ref 4.0–10.5)

## 2010-11-18 LAB — DIFFERENTIAL
Basophils Absolute: 0 10*3/uL (ref 0.0–0.1)
Basophils Relative: 0 % (ref 0–1)
Eosinophils Absolute: 0.1 10*3/uL (ref 0.0–0.7)
Eosinophils Relative: 1 % (ref 0–5)
Lymphocytes Relative: 32 % (ref 12–46)
Lymphs Abs: 2.9 10*3/uL (ref 0.7–4.0)
Monocytes Absolute: 0.5 10*3/uL (ref 0.1–1.0)
Monocytes Relative: 5 % (ref 3–12)
Neutro Abs: 5.7 10*3/uL (ref 1.7–7.7)
Neutrophils Relative %: 62 % (ref 43–77)

## 2010-11-18 LAB — GLUCOSE, CAPILLARY: Glucose-Capillary: 132 mg/dL — ABNORMAL HIGH (ref 70–99)

## 2010-11-18 LAB — URINALYSIS, ROUTINE W REFLEX MICROSCOPIC
Bilirubin Urine: NEGATIVE
Glucose, UA: NEGATIVE mg/dL
Hgb urine dipstick: NEGATIVE
Ketones, ur: NEGATIVE mg/dL
Nitrite: NEGATIVE
Protein, ur: NEGATIVE mg/dL
Specific Gravity, Urine: 1.02 (ref 1.005–1.030)
Urobilinogen, UA: 0.2 mg/dL (ref 0.0–1.0)
pH: 6 (ref 5.0–8.0)

## 2010-11-18 LAB — POCT I-STAT 4, (NA,K, GLUC, HGB,HCT)
Glucose, Bld: 155 mg/dL — ABNORMAL HIGH (ref 70–99)
HCT: 36 % (ref 36.0–46.0)
Hemoglobin: 12.2 g/dL (ref 12.0–15.0)
Potassium: 4 mEq/L (ref 3.5–5.1)
Sodium: 142 mEq/L (ref 135–145)

## 2010-11-18 LAB — COMPREHENSIVE METABOLIC PANEL
ALT: 47 U/L — ABNORMAL HIGH (ref 0–35)
AST: 61 U/L — ABNORMAL HIGH (ref 0–37)
Albumin: 4.1 g/dL (ref 3.5–5.2)
Alkaline Phosphatase: 76 U/L (ref 39–117)
BUN: 16 mg/dL (ref 6–23)
CO2: 30 mEq/L (ref 19–32)
Calcium: 9.6 mg/dL (ref 8.4–10.5)
Chloride: 98 mEq/L (ref 96–112)
Creatinine, Ser: 0.8 mg/dL (ref 0.4–1.2)
GFR calc Af Amer: >60 mL/min (ref >60)
GFR calc non Af Amer: >60 mL/min (ref >60)
Glucose, Bld: 174 mg/dL — ABNORMAL HIGH (ref 70–99)
Potassium: 2.9 mEq/L — ABNORMAL LOW (ref 3.5–5.1)
Sodium: 140 mEq/L (ref 135–145)
Total Bilirubin: 0.4 mg/dL (ref 0.3–1.2)
Total Protein: 7 g/dL (ref 6.0–8.3)

## 2010-11-18 LAB — SURGICAL PCR SCREEN
MRSA, PCR: NEGATIVE
Staphylococcus aureus: POSITIVE — AB

## 2010-11-19 ENCOUNTER — Telehealth: Payer: Self-pay | Admitting: Family Medicine

## 2010-11-19 ENCOUNTER — Telehealth: Payer: Self-pay | Admitting: *Deleted

## 2010-11-19 NOTE — Telephone Encounter (Signed)
After speaking with [facility] I was informed that tub chair/bench do not need an order to get. Patient can go to [facility] and pick one up. Unfortunately Medicare doesn't cover these so patient will have to pay out of pocket. Priced around $38 dollars.

## 2010-11-19 NOTE — Telephone Encounter (Signed)
After speaking with [facility] I was informed that tub chair/bench do not need an order to get. Patient can go to [facility] and pick one up. Unfortunately Medicare doesn't cover these so patient will have to pay out of pocket. Priced around $38 dollars. 

## 2010-11-19 NOTE — Telephone Encounter (Signed)
Pt wants to know if MD will approve getting her a chair/bench for her bath tub? Pt would like to get it from [facility].

## 2010-11-19 NOTE — Telephone Encounter (Signed)
Yes Do we need tosend a RX or just call and give a voice order Let me know THANKS! Denny Levy, MD

## 2010-11-19 NOTE — Telephone Encounter (Signed)
Spoke with patient and informed.

## 2010-11-20 ENCOUNTER — Telehealth: Payer: Self-pay | Admitting: Family Medicine

## 2010-11-20 ENCOUNTER — Encounter: Payer: Self-pay | Admitting: Family Medicine

## 2010-11-20 MED ORDER — PREGABALIN 100 MG PO CAPS: ORAL_CAPSULE | ORAL | Status: DC

## 2010-11-20 NOTE — Telephone Encounter (Signed)
Pt is asking for a print out of all her meds with the dosage & how she takes them, would like to pick this up today!

## 2010-11-20 NOTE — Miscellaneous (Signed)
Summary: [employer]   Imported By: Sherian Rein 11/12/2010 14:21:05  _____________________________________________________________________  External Attachment:    Type:   Image     Comment:   External Document

## 2010-11-20 NOTE — Telephone Encounter (Signed)
Pt calling again, wants [place] to fax to [facility] at [phone]

## 2010-11-20 NOTE — Patient Instructions (Signed)
These are your meds

## 2010-11-21 ENCOUNTER — Encounter: Payer: Self-pay | Admitting: Family Medicine

## 2010-11-21 HISTORY — PX: COLONOSCOPY W/ BIOPSIES: SHX1374

## 2010-11-21 NOTE — Telephone Encounter (Signed)
Med list just now faxed.

## 2010-11-21 NOTE — Patient Instructions (Addendum)
These are your medicines I will fax as you requested to [facility]  At [phone]

## 2010-11-21 NOTE — Telephone Encounter (Signed)
Pt calling again, says she has a procedure scheduled today at 1 pm & they have to have to med list, please call pt.

## 2010-11-27 ENCOUNTER — Ambulatory Visit (INDEPENDENT_AMBULATORY_CARE_PROVIDER_SITE_OTHER): Payer: No Typology Code available for payment source

## 2010-11-27 DIAGNOSIS — J301 Allergic rhinitis due to pollen: Secondary | ICD-10-CM

## 2010-12-13 ENCOUNTER — Ambulatory Visit (INDEPENDENT_AMBULATORY_CARE_PROVIDER_SITE_OTHER): Payer: No Typology Code available for payment source

## 2010-12-13 DIAGNOSIS — J309 Allergic rhinitis, unspecified: Secondary | ICD-10-CM

## 2010-12-17 ENCOUNTER — Encounter: Payer: Self-pay | Admitting: Family Medicine

## 2010-12-19 ENCOUNTER — Ambulatory Visit (INDEPENDENT_AMBULATORY_CARE_PROVIDER_SITE_OTHER): Payer: No Typology Code available for payment source | Admitting: Family Medicine

## 2010-12-19 ENCOUNTER — Encounter: Payer: Self-pay | Admitting: Family Medicine

## 2010-12-19 VITALS — BP 157/79 | HR 68 | Temp 97.4°F | Ht 61.6 in | Wt 181.0 lb

## 2010-12-19 DIAGNOSIS — R269 Unspecified abnormalities of gait and mobility: Secondary | ICD-10-CM

## 2010-12-19 DIAGNOSIS — M546 Pain in thoracic spine: Secondary | ICD-10-CM

## 2010-12-19 DIAGNOSIS — M549 Dorsalgia, unspecified: Secondary | ICD-10-CM

## 2010-12-19 DIAGNOSIS — G8929 Other chronic pain: Secondary | ICD-10-CM | POA: Insufficient documentation

## 2010-12-19 DIAGNOSIS — M545 Low back pain: Secondary | ICD-10-CM | POA: Insufficient documentation

## 2010-12-19 DIAGNOSIS — M5416 Radiculopathy, lumbar region: Secondary | ICD-10-CM | POA: Insufficient documentation

## 2010-12-19 DIAGNOSIS — E119 Type 2 diabetes mellitus without complications: Secondary | ICD-10-CM

## 2010-12-19 LAB — POCT GLYCOSYLATED HEMOGLOBIN (HGB A1C): Hemoglobin A1C: 6.9

## 2010-12-19 NOTE — Patient Instructions (Signed)
rtc 3 m

## 2010-12-19 NOTE — Assessment & Plan Note (Signed)
We'll set up physical therapy began. She really needs gait in bowel, eval for assistive devices.

## 2010-12-19 NOTE — Assessment & Plan Note (Signed)
A1c is 6.9 today which is great. I will make no changes. Whatever she is able to get in with the flex pen seems to be working. I will follow this up in 3 months.

## 2010-12-19 NOTE — Progress Notes (Signed)
  Subjective:    Patient ID: [name] [name], female    DOB: [birth year], 60 y.o.   MRN: [id number]  HPI  BACK pain: Continues to have mid back left-sided back pain that is constant 6 out of 10. It does not radiate into her . her leg. Has not had any leg weakness or incontinence.  GAIT: Is currently walking with a cane but is fairly unsteady. 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 She also has difficulty holding her cane or using it well.  DIABETES mellitus: Has continued to use her insulin intermittently but is probably getting it more days than not now. Still having difficulty injecting herself even with the flex pin. Appetite has been the same and she's not had any significant weight gain or weight loss. No nausea or vomiting. No change in her stools. No dizzy episodes.  Preventive care: Had her colonoscopy and they found one polyp and she is to have a repeat in 5 years. Review of Systems See history of present illness for pertinent review of systems.    Objective:   Physical Exam    GENERALl: Well developed, well nourished, in no acute distress. Walks with a cane. EYES: Restingnystagmus, legally blind. NECK: Supple, FROM, without lymphadenopathy.  THYROID: normal without nodularity CAROTID ARTERIES: without bruits LUNGS: clear to auscultation bilaterally. No wheezes or rales. HEART: Regular rate and rhythm, no murmurs ABDOMEN: soft with positive bowel sounds NEURO: No gross focal deficits BACK: Tender to palpation over the left mid back large muscles and this reproduces her pain. There is some muscle spasm noted there. Lower extremity strength 5 out of 5 at the hip flexors.  GAIT: Some difficulty rising from a chair even with use of her cane and she has some weakness and balance issues. Once upright she is fairly unsteady. I think this is partly related to her peripheral neuropathy and partly to her  blindness.      Assessment & Plan:

## 2010-12-21 ENCOUNTER — Encounter: Payer: Self-pay | Admitting: Family Medicine

## 2010-12-27 ENCOUNTER — Ambulatory Visit (INDEPENDENT_AMBULATORY_CARE_PROVIDER_SITE_OTHER): Payer: No Typology Code available for payment source

## 2010-12-27 DIAGNOSIS — J309 Allergic rhinitis, unspecified: Secondary | ICD-10-CM

## 2011-01-01 ENCOUNTER — Telehealth: Payer: Self-pay | Admitting: Family Medicine

## 2011-01-01 NOTE — Telephone Encounter (Signed)
Pt checking status of physical therapy referral

## 2011-01-01 NOTE — Telephone Encounter (Signed)
Pt has been informed of appt for 5.10.12@ 11 AM @ [address] location.[name] [name]

## 2011-01-01 NOTE — Telephone Encounter (Signed)
in

## 2011-01-03 ENCOUNTER — Other Ambulatory Visit: Payer: Self-pay | Admitting: *Deleted

## 2011-01-03 MED ORDER — METOPROLOL SUCCINATE ER 50 MG PO TB24: 50.0000 mg | ORAL_TABLET | Freq: Two times a day (BID) | ORAL | Status: DC

## 2011-01-10 ENCOUNTER — Ambulatory Visit: Payer: No Typology Code available for payment source | Attending: Family Medicine | Admitting: Physical Therapy

## 2011-01-10 DIAGNOSIS — R5381 Other malaise: Secondary | ICD-10-CM | POA: Insufficient documentation

## 2011-01-10 DIAGNOSIS — M2560 Stiffness of unspecified joint, not elsewhere classified: Secondary | ICD-10-CM | POA: Insufficient documentation

## 2011-01-10 DIAGNOSIS — M2550 Pain in unspecified joint: Secondary | ICD-10-CM | POA: Insufficient documentation

## 2011-01-10 DIAGNOSIS — IMO0001 Reserved for inherently not codable concepts without codable children: Secondary | ICD-10-CM | POA: Insufficient documentation

## 2011-01-10 DIAGNOSIS — R262 Difficulty in walking, not elsewhere classified: Secondary | ICD-10-CM | POA: Insufficient documentation

## 2011-01-15 NOTE — Discharge Summary (Signed)
[name], [name] [name]              ACCOUNT NO.:  [id number]   MEDICAL RECORD NO.:  [id number]          PATIENT TYPE:  AMB   LOCATION:  CARD                         FACILITY:  [facility]   PHYSICIAN:  Oretha Milch, MD      DATE OF BIRTH:  April 19, 1951   DATE OF ADMISSION:  07/29/2007  DATE OF DISCHARGE:                               DISCHARGE SUMMARY   PROCEDURE:  Bronchoscopy.   INDICATIONS FOR PROCEDURE:  Chronic intractable cough not responding to  empiric therapy for upper airway cough syndrome, GERD, or asthma.  Differential diagnosis of eosinophilic bronchitis is being considered.  Written informed consent was obtained from the patient prior to the  procedure.  The risks, including cough, bleeding, and small chance of  lung puncture requiring a chest tube were discussed, and she voiced  understanding.   Versed 2 mg and 50 mcg fentanyl were used for conscious sedation in  divided doses during the procedure.  The bronchoscope was passed through  the right nares.  Inflamed nasal turbinates were observed with mucoid  discharge.  Upper airway otherwise looked normal.  Vocal cords showed  normal appearance and motion.  The trachea, bronchi, segmental and  subsegmental bronchi were all patent.  There was some inflammation of  the mucosa which was consistent with cough trauma.  No punctate  hemorrhages were noted.  No endobronchial lesions were noted.  Bronchoalveolar lavage was obtained from the right lower lobe segments  under fluoroscopy.  Transbronchial biopsies were obtained from the right  lower lobe and the right middle lobe.  The patient tolerated the  procedure well.  A chest x-ray will be performed to rule out presence of  pneumothorax.   Specimens will be sent for culture, AFB, cytology.      Oretha Milch, MD  Electronically Signed     RVA/MEDQ  D:  07/29/2007  T:  07/29/2007  Job:  [id number]

## 2011-01-15 NOTE — Assessment & Plan Note (Signed)
[facility]                            CARDIOLOGY OFFICE NOTE   NAME:Richardson, LAQUITA HARLAN                     MRN:          [id number]  DATE:06/02/2008                            DOB:          01-Oct-1950    IDENTIFICATION:  Ms. Cloninger is a 60 year old woman.  I saw her back in  January of this past year.  Previously, she had been followed by Dr.  Graceann Congress.  She has a history of hypertension, GE reflux,  dyslipidemia, diabetes, chronic cough.  She is also followed in  Pulmonary.   Since seen, she is doing okay.  She is not that active.  She says if she  gets on the bike and her heart rate increases, she slows down.  If she  is walking too fast and her heart rate increases, she slows down.  She  denies significant chest pain.  Breathing is relatively unchanged.   Her current medicines include,  1. Metoprolol 25 b.i.d.  2. Metformin 1500 a.m., 1 g p.m.  3. Eye drops __________ b.i.d.  4. Acyclovir 800 b.i.d.  5. Aspirin 325.  6. Clonidine 0.1 b.i.d.  7. Crestor 20.  8. Lantus insulin.  9. Lasix 20.  10.Nexium 40.  11.Singulair 10.  12.Carafate 1 g q.i.d.  13.Hyzaar 100/25.  14.Potassium 20 mEq.  15.NovoLog insulin.  16.CPAP.   PHYSICAL EXAMINATION:  GENERAL:  The patient is in no distress.  VITAL SIGNS:  At rest, blood pressure is 141/82, pulse is 70 and  regular, weight 188 relatively stable.  LUNGS:  No rales or wheezes.  CARDIAC:  Regular rate and rhythm, S1 and S2, no S3, no murmurs.  ABDOMEN:  Benign, obese.  EXTREMITIES:  No edema.   IMPRESSION:  1. Hypertension.  Blood pressures at home actually have been in the      150 range.  With this and with her noticing her heart increase when      she does things, I will increase her metoprolol to 50 b.i.d.      Followup in a month and a half.  Continue other meds.  2. Dyslipidemia, we will need to review.  Her HDL unfortunately is low      at 27, LDL fairly good at 79.  I do not want  to trouble her      diabetes, though I told her I would be back in touch with her.      Keep on same regimen for now.  3. Reflex, followed in Gastrointestinal.  4. Diabetes, on agents as noted.  5. Health care maintenance.  Encouraged her to maintain and increase      level of activity.   I will set followup again in about a month and a month and a half.      Pricilla Riffle, MD, [facility]  Electronically Signed    PVR/MedQ  DD: 06/02/2008  DT: 06/03/2008  Job #: [id number]

## 2011-01-15 NOTE — Assessment & Plan Note (Signed)
[facility]                            CARDIOLOGY OFFICE NOTE   NAME:Mullally, TYLOR GAMBRILL                     MRN:          [id number]  DATE:06/27/2008                            DOB:          03/11/1951    IDENTIFICATION:  Ms. Waldeck is a 60 year old woman previously followed by  Dr. Graceann Congress.  I last saw her on June 02, 2008.  At that time, I  increased her metoprolol to 50 mg b.i.d.  She had noted her heart rate  increases, so she slows down.   In the interval, she has been doing okay.  Note, some shortness of  breath with walking at times.   CURRENT MEDICINES INCLUDE:  1. Metformin 1500 a.m., 1 g p.m.  2. Eye drops.  3. QVAR.  4. Toprol succinate 50 b.i.d.  5. Acyclovir 800 b.i.d.  6. Aspirin 325.  7. Clonidine 0.1 b.i.d.  8. Crestor 20.  9. Lantus insulin.  10.Lasix 20.  11.Nexium 40.  12.Singulair.  13.Carafate 1 q.i.d.  14.Hyzaar 100/25.  15.Potassium 20.   PHYSICAL EXAMINATION:  GENERAL:  The patient is in no distress.  VITAL SIGNS:  Blood pressure is 110/76, pulse is 84 and regular, weight  186.  LUNGS:  Clear.  CARDIAC:  Regular rate and rhythm.  S1 and S2.  No S3.  No significant  murmurs.  ABDOMEN:  Benign.  EXTREMITIES:  No edema.   IMPRESSION:  1. Hypertension.  Blood pressure is running better.  Review it at      home.  Would continue.  2. Dyslipidemia.  HDL 27, LDL 35, triglycerides 145, total cholesterol      134.  Encouraged her to stay active.  Try to get her weight down to      help the LDL.  With diabetes, can add Niaspan.  3. Reflux.  Follow in GI.  4. Pulmonary.  Extensive coughing.  She is followed by Dr. Jetty Duhamel, is having allergy injection, had a swallowing study, which I      do not find the results of.  Again, will follow up with Dr. Jetty Duhamel.   I will set to see the patient, otherwise, in the winter or sooner if  problems develop.     Pricilla Riffle, MD, [facility]  Electronically Signed    PVR/MedQ  DD: 06/27/2008  DT: 06/28/2008  Job #: [id number]

## 2011-01-15 NOTE — Assessment & Plan Note (Signed)
[facility]                             PULMONARY OFFICE NOTE   NAME:[name], [name] DUELL                     MRN:          [id number]  DATE:04/27/2007                            DOB:          [birth year]    [name] returns for a followup of her chronic cough today.  She was  able to take Deconamine for about three weeks before she had a jittery  reaction.  She said that her heart started racing.  She is not sure if  this was because of the medicine or because of her nebulizer.  At any  rate, she stopped taking this.  Her cough has not changed much.   To summarize chest CT on March 2008 was normal (without any evidence of  fibrosis).  Spirometry had suggested restriction and no evidence of  obstruction which is why I not have pursued a methacholine challenge.   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  A pH probe study on  Nexium and Reglan showed some breakthrough reflux, but I was not  impressed with this.   MEDICATIONS:  A review of her medications does not reveal any cause for  the cough.   PHYSICAL EXAMINATION:  VITAL SIGNS:  Exam today is unremarkable.  Weight  188 pounds, blood pressure 110/60.  HEENT:  No postnasal drip, narrow pharyngeal space.  CHEST:  Clear to auscultation.  CV:  S1, S2 normal.   IMPRESSION:  1. Chronic unexplained cough.  2. Upper airway cough syndrome versus gastroesophageal reflux disease.  3. 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  4. Doubt asthma.   RECOMMENDATIONS:  1. She will continue using the Nexium and Reglan.  2. Tessalon Perles were prescribed, and I had told her if she is      having a bad day she can take this q.6 h.  3. I doubt that bronchoscopy will add to the workup here.  Since she      has not responded to a regimen of inhaled steroids I doubt      eosinophilic bronchitis.  We will observe her for another 5-6      months.    [name] Milch, MD  Electronically Signed   RVA/MedQ  DD: 04/27/2007  DT: 04/27/2007  Job #: [id number]   cc:   Adolph Pollack, M.D.

## 2011-01-15 NOTE — Assessment & Plan Note (Signed)
[facility]                            CARDIOLOGY OFFICE NOTE   NAME:[name], [name] WIEN                     MRN:          [id number]  DATE:03/10/2007                            DOB:          1951-01-22    IDENTIFICATION:  [name] [name] is a 60 year old woman, who was previously  seen by Glennon Hamilton.  She has a history of hypertension, dyslipidemia,  diabetes, chronic cough.  He saw her back in March and actually  scheduled a 2D echo, BNP.   Echocardiogram was done on March 20 that showed vigorous LV function,  normal LV thickness.  Aortic valve was mildly thickened, calcified, with  a mean gradient of 10.  MR was mild.  Coronary sinus was noted to be  somewhat enlarged.  Patient also had carotid scan done that was normal.   Since seen in cardiology, the patient has also been followed a few times  in pulmonary, felt to have significant GE reflux with vocal cord  dysfunction, laryngeal pharyngeal component.  She is currently set to  see Dr. Abbey Chatters for possible surgery, given her significant reflux.   In talking to her, her breathing has improved some with medical therapy.  She still gets significant shortness of breath when she starts coughing.  She denies palpitations, no chest pain.   CURRENT MEDICATIONS:  1. Aspirin 325.  2. Clonidine 0.1 b.i.d.  3. Cozaar 100 daily.  4. Crestor 20 daily.  5. Glipizide 10 b.i.d.  6. Hydrochlorothiazide 25 daily.  7. Lantus q.h.s.  8. K-Dur 20 mEq daily.  9. Lasix 20 p.r.n. ? daily.  10.Metformin one tab q.a.m., one and a half tabs q.p.m.  11.Nexium 40 b.i.d.  12.Reglan 10 b.i.d.  13.Sertraline 150 q.h.s.  14.Singulair daily.  15.Spiriva and Symbicort inhalers.   PHYSICAL EXAM:  Patient is in no distress.  Blood pressure is 154/93,  pulse is 122, weight 195.  On my check, pulse is 112.  LUNGS:  Relatively clear.  CARDIAC EXAM:  Regular rate and rhythm, S1, S2.  Grade 1-2/6 systolic  murmur.  No S3 or  S4.  ABDOMEN:  Benign.  EXTREMITIES:  No edema.   IMPRESSION:  1. Hypertension, higher today than it has been.  Will need followup.      Keep on current medicines for now.  2. Dyslipidemia.  On statins.  Lipids in March show excellent control      with HDL of 46, LDL of 69, triglycerides 61.  3. Reflux with vocal cord irritation.  Patient is due to be seen by      general surgery.  4. Diabetes, on insulin.  5. Tachycardia.  EKG with sinus tachycardia today.  I will set patient      up for a 24-hour Holter to make sure this is not predominance in      that.  I will be in touch with the results.  Continue current      medicines.  6. Patient had cardiac catheterization in 2005.  No coronary artery      disease was noted.  I do not sense there has  been significant      change in her cardiac status.  From a cardiac standpoint, I think      she would be at relatively low risk for any planned surgery and      should be able to proceed without further testing, again awaiting      Holter results.   I will set followup in the winter, sooner if problems develop, be in  touch with her regarding the test results.     Pricilla Riffle, MD, [facility]  Electronically Signed    PVR/MedQ  DD: 03/10/2007  DT: 03/11/2007  Job #: [id number]   cc:   Adolph Pollack, M.D.

## 2011-01-15 NOTE — Assessment & Plan Note (Signed)
[facility]                            CARDIOLOGY OFFICE NOTE   NAME:[name], [name] ROSCH                     MRN:          [id number]  DATE:09/18/2007                            DOB:          1951/03/07    IDENTIFICATION:  The patient is a 60 year old woman who had previously  been followed by Dr. Glennon [name].  I last saw her in July of last year.  She has a history of hypertension, reflux, dyslipidemia, diabetes,  chronic cough.  She is also followed in Pulmonary.   Since seen, she has been doing fair.  Note:  When I saw her, there were  considerations for surgery, which she says she cannot afford.   She notes some chest discomfort, but it is made worse by moving her  chest.  No other types of chest pain.   CURRENT MEDICATIONS:  1. Metoprolol 25 b.i.d.  2. Reglan 5 mg t.i.d. and nightly.  3. Acyclovir 800 mg b.i.d.  4. Albuterol nebulizer b.i.d.  5. Aspirin 325 mg.  6. Clonidine 0.1 mg b.i.d.  7. Crestor 20 mg.  8. Glipizide.  9. Lantus 8.  10.Lasix 20 mg b.i.d.  11.Metformin 1 g b.i.d.  12.Nexium.  13.Reglan 10 mg.  14.Singulair 10 mg.  15.Spiriva.  16.Carafate 1 g q.i.d.  17.Hyzaar 125 mg.  18.Potassium 25 mEq.  19.NovoLog.   PHYSICAL EXAM:  On exam, the patient was in no acute distress on  arrival.  She did develop a coughing jag after seen.  Blood pressure 141/80, pulse 77, weight 187.  LUNGS:  clear with mild upper airway wheezing.  CARDIAC:  Regular rate and rhythm, S1 and S2.  No S3.  No murmurs.  ABDOMEN:  Benign.  EXTREMITIES:  No edema.   TWELVE-LEAD EKG:  Sinus rhythm at 77 beats per minute.  Nonspecific T-  wave changes.   IMPRESSION:  1. Hypertension, fair control.  Would follow.  2. Dyslipidemia.  Last lipid panel was in March 2008.  We will need to      again make sure she has a repeat.  3. Reflux, followed by Pulmonary, has seen Gastroenterology.  4. Diabetes, on agents.  5. Health care maintenance.  I would  recommend if she can to get into      a dietary program for her diabetes and weight loss, which should      help with her reflux.  We will put in a request for this.   Otherwise, I will set to see her back next fall.     Pricilla Riffle, MD, [facility]  Electronically Signed    PVR/MedQ  DD: 09/21/2007  DT: 09/21/2007  Job #: [id number]

## 2011-01-15 NOTE — Assessment & Plan Note (Signed)
[facility]                             PULMONARY OFFICE NOTE   NAME:Gehlhausen, WILBA MUTZ                     MRN:          [id number]  DATE:03/23/2007                            DOB:          Jan 09, 1951    Ms. Linebaugh is a pleasant 60 year old black woman with congenital cataract  and blindness who was seeing Dr. Jayme Cloud for a chronic cough.  She  reports a cough for more than 20 years, and the longest time that she  has been symptom-free seems to have been for about 3 years.  Imaging  studies including a chest CT have been unremarkable.  Inspection of her  upper airway by Dr. Gerilyn Pilgrim has suggested laryngopharyngeal reflux.  She  has been maintained on Nexium and Reglan.  A pH probe on this regimen  did reveal a breakthrough of reflux.  She tells me that she has problems  obtaining her Nexium. She has been evaluated by Dr. Abbey Chatters for  Nissen fundoplication procedure and this is being considered, perhaps  with a gastric banding procedure.   Spirometry on multiple occasions has shown normal FEV1/FVC ratio and  suggested moderate restriction.  She has been maintained on Singulair,  Spiriva and Symbicort inhaler for this.  Her other medical problems  include diabetes and hypertension.  Her other medications include  aspirin, clonidine, Cozaar, Crestor, glipizide, hydrochlorothiazide,  Lantus insulin, Lasix, metformin and sucralfate, besides the above.   On her exam today, blood pressure 110/62, pulse of 89, oxygen saturation  97% on room air.  HEENT:  No postnasal drip.  NECK:  Supple. No JVD.  CVS:  S1 and S2 normal.  CHEST:  Clear to auscultation.  ABDOMEN:  Soft.   IMPRESSION:  1. Chronic cough.  2. She seems to be on a good regimen for gastroesophageal reflux      disease and asthma.  I am not sure that she is able to take the      Nexium like she is supposed to.  Fundoplication procedure is being      considered.  A psychogenic element to her  cough is possible, given      the longstanding nature of her symptoms.  Upper airway cough      syndrome (postnasal drip) is always in the differential.   RECOMMEND:  1. I have reviewed her pH probe studies and manometry studies. I note      that this was done on PPI therapy. The amount of reflux recorded      does not seem to be very impressive.  2. We will give her a first-generation antihistaminic and decongestant      combination to suppress her sinuses for 4 weeks.  I have advised      her of the sedative effect of the antihistaminic and I asked her to      monitor her blood pressure while on the decongestant.      She will call me for any problems.  3. Nexium samples were provided.     Oretha Milch, MD  Electronically Signed    RVA/MedQ  DD:  03/23/2007  DT: 03/24/2007  Job #: [id number]   cc:   Lucky Cowboy, MD  Melina Fiddler, MD

## 2011-01-15 NOTE — Letter (Signed)
November 16, 2008     RE:  MAYU, RONK  MRN:  [id number]  /  DOB:  December 20, 1950   To whom it may concern:   Ms. Prak is under my medical care for lung disease which is limiting  her transportation ability.  She is requesting [employer] transportation  assistance for medical visits, saying that she needs it more often  during the day.  She does try to use the bus where possible as an  alternative.    Sincerely,      Clinton D. Maple Hudson, MD, [facility], FACP  Electronically Signed    CDY/MedQ  DD: 11/16/2008  DT: 11/17/2008  Job #: [id number]

## 2011-01-16 ENCOUNTER — Ambulatory Visit (INDEPENDENT_AMBULATORY_CARE_PROVIDER_SITE_OTHER): Payer: No Typology Code available for payment source

## 2011-01-16 DIAGNOSIS — J309 Allergic rhinitis, unspecified: Secondary | ICD-10-CM

## 2011-01-17 ENCOUNTER — Encounter: Payer: Self-pay | Admitting: Internal Medicine

## 2011-01-18 NOTE — Procedures (Signed)
NAMEJONASIA, COINER              ACCOUNT NO.:  [id number]   MEDICAL RECORD NO.:  [id number]          PATIENT TYPE:  OUT   LOCATION:  SLEEP CENTER                 FACILITY:  [facility]   PHYSICIAN:  Clinton D. Maple Hudson, M.D. DATE OF BIRTH:  09-15-1950   DATE OF STUDY:  07/15/2005                              NOCTURNAL POLYSOMNOGRAM   REFERRING PHYSICIAN:  Dr. Pearlean Brownie.   DATE OF STUDY:  July 15, 2005.   INDICATION FOR STUDY:  Hypersomnia with sleep apnea.   EPWORTH SLEEPINESS SCORE:  15/24.   BMI:  32.   WEIGHT:  181 pounds.   SLEEP ARCHITECTURE:  Total sleep time 284 minutes with sleep efficiency 68%.  Stage I was 16%, stage II 78%, stages III and IV absent, REM 6% of total  sleep time. Sleep latency 41 minutes, REM latency 319 minutes, awake after  sleep onset 96 minutes, arousal index 41. She took routine nighttime  medications and also took Rozerem at bedtime.   RESPIRATORY DATA:  Split study protocol. Apnea/hypopnea index (AHI, RDI)  39.2 obstructive events per hour indicating moderate obstructive sleep  apnea/hypopnea syndrome before C-PAP. There were 10 obstructive apneas, 1  mixed apnea and 73 hypopneas before C-PAP. Events were not positional. REM  AHI of 14.5. C-PAP was titrated to 12 CWP, AHI 0 per hour. A medium ResMed  full-face mask was used with heated humidifier.   OXYGEN DATA:  Moderate to occasionally loud snoring with oxygen desaturation  to a nadir of 82% before C-PAP. After C-PAP, control saturation held 94-98%  on room air.   CARDIAC DATA:  Normal sinus rhythm.   MOVEMENT/PARASOMNIA:  The technician described her as somewhat restless  during the night tossing and turning a lot with some difficulty maintaining  sleep and bathroom x2. There were 170 limb jerks of which 22 were associated  with arousal or awakening for a periodic limb movement with arousal index of  4.6 per hour which is mildly increased.   IMPRESSION/RECOMMENDATIONS:  1.   Moderate obstructive sleep apnea/hypopnea syndrome, AHI 39.2 per hour      with moderate to loud snoring and oxygen desaturation to 82%.  2.  Successful C-PAP titration to 12 CWP, AHI 0 per hour. A medium ResMed      full-face mask was used with      heated humidifier. C-PAP normalized her oxygen and prevented snoring.  3.  Periodic limb movement with arousal, 4.6 per hour.      Clinton D. Maple Hudson, M.D.  Diplomate, Biomedical engineer of Sleep Medicine  Electronically Signed     CDY/MEDQ  D:  07/21/2005 10:50:03  T:  07/21/2005 23:09:50  Job:  [id number]

## 2011-01-18 NOTE — Op Note (Signed)
   NAME:  [name] [name], [name] [name]                        ACCOUNT NO.:  [id number]   MEDICAL RECORD NO.:  [id number]                   PATIENT TYPE:  AMB   LOCATION:  DSC                                  FACILITY:  [facility]   PHYSICIAN:  Thera Flake., M.D.             DATE OF BIRTH:  21-Oct-1950   DATE OF PROCEDURE:  06/15/2003  DATE OF DISCHARGE:                                 OPERATIVE REPORT   PREOPERATIVE DIAGNOSES:  1. Impingement.  2. Acromioclavicular joint arthritis.  3. Anterior superior labral tear.   POSTOPERATIVE DIAGNOSES:  1. Impingement.  2. Acromioclavicular joint arthritis.  3. Anterior superior labral tear.   PROCEDURE:  1. Arthroscopic acromioplasty.  2. Arthroscopic debridement of torn labrum.  3. Arthroscopic excision of distal clavicle.   SURGEON:  Weber Cooks. Madelon Lips, M.D.   ANESTHESIA:  General and block.   INDICATIONS FOR PROCEDURE:  The patient is 89 with signs of recurrent  impingement, AC arthritis not responding to conservative treatment felt to  be amenable to overnight hospitalization.   DESCRIPTION OF PROCEDURE:  Examination under anesthesia showed normal range  of motion, arthroscoped through posterolateral and anterior portals.  Systematic inspection of the shoulder showed the patient to have mild to  moderate degenerative tear in the anterior superior labrum that was  debrided.  Biceps anchor was intact, however.  There was a large foramen in  the middle glenohumeral ligament.  The anterior glenohumeral ligament  appeared normal.  Small undersurface tear was debrided partial thickness.  Bursa was entered, hypertrophied.  Excision of the bursa was carried out.  Acromion was more down sloping than thought to be on the MRI, probably a  type 2 acromion debrided.  Acromioclavicular arthritis obviously moderately  severe acromioclavicular arthritis requiring resection of about 1-1.5 cm of  the distal clavicle.  Impingement was released from the  clavicle and AC  arthritis as well as from the acromial bursa carried out.  Superior surface  of the cuff appeared normal.   Shoulder drained free of fluid.  Portal was closed with nylon.  Lightly  compressive sterile dressing and sling applied.                                               Thera Flake., M.D.    WDC/MEDQ  D:  06/15/2003  T:  06/15/2003  Job:  [id number]

## 2011-01-18 NOTE — Cardiovascular Report (Signed)
NAME:  [name] [name], [name] [name]                        ACCOUNT NO.:  [id number]   MEDICAL RECORD NO.:  [id number]                   PATIENT TYPE:  OIB   LOCATION:  5523                                 FACILITY:  [facility]   PHYSICIAN:  Carole Binning, M.D. [facility]         DATE OF BIRTH:  September 22, 1950   DATE OF PROCEDURE:  10/20/2003  DATE OF DISCHARGE:                              CARDIAC CATHETERIZATION   PROCEDURE PERFORMED:  Left heart catheterization with coronary angiography  and left ventriculography.   INDICATION:  Ms. Rickert is a 60 year old woman who presented to the office  with symptoms of progressive exertional chest pain.  The patient was  evaluated by Dr. Corinda Gubler and referred for cardiac catheterization to rule  out coronary artery disease.   CATHETERIZATION PROCEDURAL NOTE:  A 6 French sheath was placed in the right  femoral artery.  Coronary angiography was performed with 6 [place] JR-4 and  JL-4 catheters.  Left ventriculography was performed with an angled pigtail  catheter.  Contrast was Omnipaque.  There were no complications.   HEMODYNAMICS:  1. Left ventricular pressure 138/15.  2. Aortic pressure 156/85.  3. There is no aortic valve gradient.   LEFT VENTRICULOGRAM:  Wall motion is normal.  Ejection fraction estimated at  greater than or equal to 65%.  There is no mitral regurgitation.   CORONARY ARTERIOGRAPHY (LEFT DOMINANT):  Left main is normal.   Left anterior descending artery is normal.   Left circumflex is a large dominant vessel giving rise to normal size ramus  intermedius, normal size first obtuse marginal branch, small first posterior  lateral branch, normal size second posterior lateral branch and a normal  size posterior descending artery.   The left circumflex is normal.   Right coronary artery is a small nondominant vessel.  The right coronary  artery is normal.   IMPRESSION:  1. Normal left ventricular systolic function.  2. Normal coronary  arteries.                                               Carole Binning, M.D. [facility]    MWP/MEDQ  D:  10/20/2003  T:  10/20/2003  Job:  [id number]   cc:   Lurena Joiner L. Jolinda Croak, M.D.  Fax: [phone]   E. Graceann Congress, M.D.

## 2011-01-23 ENCOUNTER — Ambulatory Visit: Payer: No Typology Code available for payment source | Admitting: Rehabilitative and Restorative Service Providers"

## 2011-01-24 ENCOUNTER — Encounter: Payer: Self-pay | Admitting: Internal Medicine

## 2011-01-24 ENCOUNTER — Ambulatory Visit (INDEPENDENT_AMBULATORY_CARE_PROVIDER_SITE_OTHER): Payer: No Typology Code available for payment source | Admitting: Sports Medicine

## 2011-01-24 ENCOUNTER — Ambulatory Visit (INDEPENDENT_AMBULATORY_CARE_PROVIDER_SITE_OTHER): Payer: Medicare Other

## 2011-01-24 ENCOUNTER — Encounter: Payer: Self-pay | Admitting: Sports Medicine

## 2011-01-24 ENCOUNTER — Ambulatory Visit (INDEPENDENT_AMBULATORY_CARE_PROVIDER_SITE_OTHER): Payer: Medicare Other | Admitting: Internal Medicine

## 2011-01-24 VITALS — BP 140/82 | HR 73 | Ht 61.0 in | Wt 184.2 lb

## 2011-01-24 VITALS — BP 152/72 | HR 72 | Temp 97.4°F | Ht 61.5 in | Wt 184.0 lb

## 2011-01-24 DIAGNOSIS — J309 Allergic rhinitis, unspecified: Secondary | ICD-10-CM

## 2011-01-24 DIAGNOSIS — J3801 Paralysis of vocal cords and larynx, unilateral: Secondary | ICD-10-CM

## 2011-01-24 DIAGNOSIS — K219 Gastro-esophageal reflux disease without esophagitis: Secondary | ICD-10-CM

## 2011-01-24 DIAGNOSIS — M25522 Pain in left elbow: Secondary | ICD-10-CM | POA: Insufficient documentation

## 2011-01-24 DIAGNOSIS — J301 Allergic rhinitis due to pollen: Secondary | ICD-10-CM | POA: Insufficient documentation

## 2011-01-24 DIAGNOSIS — G4733 Obstructive sleep apnea (adult) (pediatric): Secondary | ICD-10-CM

## 2011-01-24 DIAGNOSIS — M25529 Pain in unspecified elbow: Secondary | ICD-10-CM

## 2011-01-24 MED ORDER — MELOXICAM 15 MG PO TABS: 15.0000 mg | ORAL_TABLET | Freq: Every day | ORAL | Status: DC

## 2011-01-24 NOTE — Assessment & Plan Note (Signed)
Clinically doing better. Paresis may have eased.

## 2011-01-24 NOTE — Progress Notes (Signed)
  Subjective:    Patient ID: [name] [name], female    DOB: 05-08-1951, 60 y.o.   MRN: [id number]  HPI Larey Seat accidentally 2d ago.  Hit L elbow.  Minimal swelling and pain, no bruising.  No loss of function.  Noted some swelling so came in.     Review of Systems    Neg except as in HPI. Objective:   Physical Exam  Constitutional: She appears well-developed and well-nourished. No distress.  Skin: Skin is warm and dry.   L arm unremarkable compared to R.  Full flexion/extension in elbow.  Minimal TTP proximal radius.  Good supination/pronation.  No change in sensation distally.  Cap refill <2 sec.       Assessment & Plan:

## 2011-01-24 NOTE — Assessment & Plan Note (Signed)
I don't think she could afford an oral appliance without insurance help. We will try once more with CPAP, by asking the [facility] sleep techs to work with her in the daytime for CPAP desensitization. Pressure can stay at 12.

## 2011-01-24 NOTE — Progress Notes (Signed)
  Subjective:    Patient ID: [name] [name], female    DOB: December 07, 1950, 60 y.o.   MRN: [id number]  HPI 01/24/11 88 yoF former smoker followed for asthma, sleep apnea, complicated by hx depression, vocal cord paralysis, GERD. Last here August 17, 2010- note reviewed  Doesn't wear CPAP- can't tolerate it on her face. Didn't like nasal pillows or her current nasal mask. Has own teeth. Aware she tosses and turns at night. CPAP 12 Advanced.  Denies recent asthma attacks. Uses rescue inhaler 1-2x/ month. Continues allergy shots and says she breathes better and coughs less since she has been on them.  No longer chokes or coughs much with eating as she used to. No recent colds.  Review of Systems Constitutional:   No weight loss, night sweats,  Fevers, chills, fatigue, lassitude. HEENT:   No headaches,  Difficulty swallowing,  Tooth/dental problems,  Sore throat,                No sneezing, itching, ear ache, nasal congestion, post nasal drip,   CV:  No chest pain,  Orthopnea, PND, swelling in lower extremities, anasarca, dizziness, palpitations  GI  No heartburn, indigestion, abdominal pain, nausea, vomiting, diarrhea, change in bowel habits, loss of appetite  Resp: No shortness of breath with exertion or at rest.  No excess mucus, no productive cough,  No non-productive cough,  No coughing up of blood.  No change in color of mucus.  No wheezing.   Skin: no rash or lesions.  GU: no dysuria, change in color of urine, no urgency or frequency.  No flank pain.  MS:  No joint pain or swelling.  No decreased range of motion.  No back pain.  Psych:  No change in mood or affect. No depression or anxiety.  No memory loss.      Objective:   Physical Exam General- Alert, Oriented, Affect-appropriate, Distress- none acute  overweight  Skin- rash-none, lesions- none, excoriation- none  Lymphadenopathy- none  Head- atraumatic  Eyes- Gross vision - thick glasses, PERRLA, conjunctivae clear  secretions  Ears- Hearing, canals, Tm - normal  Nose- Clear, No- Septal dev, mucus, polyps, erosion, perforation   Throat- Mallampati II , mucosa clear , drainage- none, tonsils- atrophic  Neck- flexible , trachea midline, no stridor , thyroid nl, carotid no bruit  Chest - symmetrical excursion , unlabored     Heart/CV- RRR , no murmur , no gallop  , no rub, nl s1 s2                     - JVD- none , edema- none, stasis changes- none, varices- none     Lung- clear to P&A, wheeze- none, cough- none , dullness-none, rub- none     Chest wall-   Abd- tender-no, distended-no, bowel sounds-present, HSM- no  Br/ Gen/ Rectal- Not done, not indicated  Extrem- cyanosis- none, clubbing, none, atrophy- none, strength- nl  Neuro- grossly intact to observation         Assessment & Plan:

## 2011-01-24 NOTE — Assessment & Plan Note (Signed)
We discussed and she will continue allergy vaccine.

## 2011-01-24 NOTE — Patient Instructions (Signed)
[facility]- Appointment with daytime [facility] sleep center staff for CPAP desensitization. Dx sleep apnea.

## 2011-01-24 NOTE — Assessment & Plan Note (Signed)
Reflux precautions renewed.

## 2011-01-24 NOTE — Assessment & Plan Note (Signed)
XR L elbow. Mobic for pain.

## 2011-01-25 ENCOUNTER — Other Ambulatory Visit: Payer: Self-pay | Admitting: Sports Medicine

## 2011-01-25 ENCOUNTER — Ambulatory Visit
Admission: RE | Admit: 2011-01-25 | Discharge: 2011-01-25 | Disposition: A | Discharge status: Home / Self Care | Payer: No Typology Code available for payment source | Source: Ambulatory Visit | Attending: Family Medicine | Admitting: Family Medicine

## 2011-01-25 ENCOUNTER — Ambulatory Visit: Payer: No Typology Code available for payment source | Admitting: Rehabilitative and Restorative Service Providers"

## 2011-01-25 DIAGNOSIS — M25522 Pain in left elbow: Secondary | ICD-10-CM

## 2011-01-28 ENCOUNTER — Encounter: Payer: Self-pay | Admitting: Internal Medicine

## 2011-01-29 ENCOUNTER — Ambulatory Visit: Payer: No Typology Code available for payment source | Admitting: Physical Therapy

## 2011-01-29 ENCOUNTER — Telehealth: Payer: Self-pay | Admitting: *Deleted

## 2011-01-29 NOTE — Telephone Encounter (Signed)
Called and informed patient.[name] [name]

## 2011-01-29 NOTE — Telephone Encounter (Signed)
Message copied by Farrell Ours on Tue Jan 29, 2011  8:35 AM ------      Message from: Monica Becton      Created: Sat Jan 26, 2011  9:13 AM       Pls let B[place]ley know all her xrays were negative for fracture.      Ihor Austin. Benjamin Stain, M.D.

## 2011-01-30 ENCOUNTER — Ambulatory Visit (INDEPENDENT_AMBULATORY_CARE_PROVIDER_SITE_OTHER): Payer: No Typology Code available for payment source | Admitting: Family Medicine

## 2011-01-30 ENCOUNTER — Encounter: Payer: Self-pay | Admitting: Family Medicine

## 2011-01-30 VITALS — BP 145/90 | HR 68 | Temp 97.3°F | Ht 61.5 in | Wt 183.6 lb

## 2011-01-30 DIAGNOSIS — I10 Essential (primary) hypertension: Secondary | ICD-10-CM

## 2011-01-30 DIAGNOSIS — W19XXXA Unspecified fall, initial encounter: Secondary | ICD-10-CM

## 2011-01-30 DIAGNOSIS — E119 Type 2 diabetes mellitus without complications: Secondary | ICD-10-CM

## 2011-01-30 DIAGNOSIS — R269 Unspecified abnormalities of gait and mobility: Secondary | ICD-10-CM

## 2011-01-30 LAB — COMPREHENSIVE METABOLIC PANEL
ALT: 39 U/L — ABNORMAL HIGH (ref 0–35)
AST: 41 U/L — ABNORMAL HIGH (ref 0–37)
Albumin: 4.2 g/dL (ref 3.5–5.2)
Alkaline Phosphatase: 101 U/L (ref 39–117)
BUN: 10 mg/dL (ref 6–23)
CO2: 33 mEq/L — ABNORMAL HIGH (ref 19–32)
Calcium: 10 mg/dL (ref 8.4–10.5)
Chloride: 104 mEq/L (ref 96–112)
Creat: 0.76 mg/dL (ref 0.40–1.20)
Glucose, Bld: 118 mg/dL — ABNORMAL HIGH (ref 70–99)
Potassium: 3.8 mEq/L (ref 3.5–5.3)
Sodium: 143 mEq/L (ref 135–145)
Total Bilirubin: 0.3 mg/dL (ref 0.3–1.2)
Total Protein: 7.2 g/dL (ref 6.0–8.3)

## 2011-01-30 NOTE — Patient Instructions (Signed)
STOP Mobic I will send you a note about your blood work  [place] to see you!  See me back in about 6 weeks or so.

## 2011-01-30 NOTE — Progress Notes (Signed)
  Subjective:    Patient ID: [name] [name], female    DOB: 1951-06-27, 60 y.o.   MRN: [id number]  HPI  Followup of recent fall. Her left elbow is better but still somewhat swollen. In fact she thinks she's having some generalized swelling and fluid retention.  The left flank/back area is still bothering her. She now notes a specific area of point tenderness. Not sure if this got worse after the fall or not. No burning on urination and no change in urinary frequency.  Not feeling particularly well today and she thinks that's why her blood pressure is elevated. She has been taking her medicines regularly and is having no problems with them. We reviewed this in detail today.  Review of Systems    review of systems is pertinent for: fatigue for one to 2 days. Chronic left back pain. Left elbow pain. No extremity swelling. Objective:   Physical Exam    GENERALl: Well developed, well nourished, in no acute distress. HEENT: Nystagmus, blindness. (Legal) NECK: Supple, FROM, without lymphadenopathy.  THYROID: normal without nodularity CAROTID ARTERIES: without bruits LUNGS: clear to auscultation bilaterally. No wheezes or rales. HEART: Regular rate and rhythm, no murmurs ABDOMEN: soft with positive bowel sounds NEURO: Decreased sensation to soft touch bilateral hands up to about a third of the way up her forearms. Stocking distribution on her feet. Refill is good in hands and feet. Gait: Unsteady. Using cane. BACK: Tender to palpation at the PSIS on the left, none on the right. The rectal muscles of the low back on the left are tender to palpation as well without discrete spasm at this time. EXTREMITY: No significant edema noted although she has some puffiness of her hands and feet      Assessment & Plan:  #1: Left flank pain nowwithr trigger point pain. Will try  trigger point injection today. #2: Hypertension. She just does look not look like she feels well today. Will not  make any  medication changes. With her subjective swelling I will check CMP. #3: Gait abnormalities: She is seeing physical therapy I filled out her paperwork for that today. I do think part of her issue is also sensory and from vision loss as well as some inner ear disturbance complicated by her significant peripheral neuropathy. I'll see her back in 6 weeks, sooner if problems.

## 2011-01-30 NOTE — Assessment & Plan Note (Signed)
Elevated BP today--I think related to generally not feeling well. Has been pretty well controlled othersie. Check CMP. No med changes

## 2011-01-31 ENCOUNTER — Encounter: Payer: Self-pay | Admitting: Family Medicine

## 2011-01-31 ENCOUNTER — Ambulatory Visit: Payer: No Typology Code available for payment source | Admitting: Physical Therapy

## 2011-02-05 ENCOUNTER — Ambulatory Visit: Payer: No Typology Code available for payment source | Attending: Family Medicine | Admitting: Physical Therapy

## 2011-02-05 DIAGNOSIS — M2560 Stiffness of unspecified joint, not elsewhere classified: Secondary | ICD-10-CM | POA: Insufficient documentation

## 2011-02-05 DIAGNOSIS — R5381 Other malaise: Secondary | ICD-10-CM | POA: Insufficient documentation

## 2011-02-05 DIAGNOSIS — IMO0001 Reserved for inherently not codable concepts without codable children: Secondary | ICD-10-CM | POA: Insufficient documentation

## 2011-02-05 DIAGNOSIS — R262 Difficulty in walking, not elsewhere classified: Secondary | ICD-10-CM | POA: Insufficient documentation

## 2011-02-05 DIAGNOSIS — M2550 Pain in unspecified joint: Secondary | ICD-10-CM | POA: Insufficient documentation

## 2011-02-06 ENCOUNTER — Telehealth: Payer: Self-pay | Admitting: Family Medicine

## 2011-02-06 DIAGNOSIS — R109 Unspecified abdominal pain: Secondary | ICD-10-CM

## 2011-02-06 NOTE — Telephone Encounter (Signed)
Dear Cl[place]on Asters Team I reviewed all of her imaging and notes--we will order CT scan of chest and abdomen--tell her there IS NO full body scan--and I think CT is better than MRI for this. Please schedule her for this and let her know I think the orders are in [facility]! Denny Levy

## 2011-02-06 NOTE — Telephone Encounter (Signed)
[name] [name] requesting a full body MRI.  Says her body doesn't feel right.

## 2011-02-06 NOTE — Telephone Encounter (Signed)
Spoke with patient she states that she has burning in legs, discomfort in arms and side. She says her body "doesn't feel right" she wants a full body MRI and was asking if you would call her if possible

## 2011-02-07 ENCOUNTER — Ambulatory Visit: Payer: No Typology Code available for payment source | Admitting: Physical Therapy

## 2011-02-07 NOTE — Telephone Encounter (Signed)
LVM for patient to call back to inform of below and of appointment set up. Appointment set up @ [facility] for Monday 6/11 @ 12pm. Patient to arrive @ 11:45am. Patient needs to go to radiology and pick up contrast dye tomorrow and they will give her instructions when she picks it up

## 2011-02-07 NOTE — Telephone Encounter (Signed)
Spoke with patient about below and she agreed to the appointment date and she will go to [facility] and pick up her contrast dye.[name] [name]

## 2011-02-11 ENCOUNTER — Ambulatory Visit (HOSPITAL_COMMUNITY)
Admission: RE | Admit: 2011-02-11 | Discharge: 2011-02-11 | Disposition: A | Discharge status: Home / Self Care | Payer: No Typology Code available for payment source | Source: Ambulatory Visit | Attending: Family Medicine | Admitting: Family Medicine

## 2011-02-11 DIAGNOSIS — R109 Unspecified abdominal pain: Secondary | ICD-10-CM

## 2011-02-11 DIAGNOSIS — I2510 Atherosclerotic heart disease of native coronary artery without angina pectoris: Secondary | ICD-10-CM | POA: Insufficient documentation

## 2011-02-12 ENCOUNTER — Telehealth: Payer: Self-pay | Admitting: Family Medicine

## 2011-02-12 ENCOUNTER — Encounter: Payer: Self-pay | Admitting: Family Medicine

## 2011-02-12 DIAGNOSIS — R109 Unspecified abdominal pain: Secondary | ICD-10-CM

## 2011-02-12 DIAGNOSIS — N200 Calculus of kidney: Secondary | ICD-10-CM

## 2011-02-12 NOTE — Telephone Encounter (Signed)
I will have to call pt's insurance company to find out what dr is in network.[name] [name] [place]

## 2011-02-12 NOTE — Telephone Encounter (Signed)
Informed pt of results and she would like to be seen by a urologist. Please let me know when referral is in, thanks!! Heath Gold

## 2011-02-12 NOTE — Telephone Encounter (Signed)
Dear [name] [name] Team Please cal [name] [name] and tell [name] [name] The CT scan showed ONLY one thing that MIGHT be causing [name] [name] issues--she has a very small kidney stone in [name] [name] left kidney. It is new since the scan last year. It is NOT in a place where it can be taken out. If she wants ---I can try to send [name] [name] to urology--they MIGHT be able to lithotripsy it but I sincerely doubt it.  everything else was A OK THANKS! Marland Kitchenme

## 2011-02-14 ENCOUNTER — Ambulatory Visit (INDEPENDENT_AMBULATORY_CARE_PROVIDER_SITE_OTHER): Payer: No Typology Code available for payment source

## 2011-02-14 DIAGNOSIS — J309 Allergic rhinitis, unspecified: Secondary | ICD-10-CM

## 2011-02-18 ENCOUNTER — Telehealth: Payer: Self-pay | Admitting: Family Medicine

## 2011-02-18 ENCOUNTER — Ambulatory Visit (INDEPENDENT_AMBULATORY_CARE_PROVIDER_SITE_OTHER): Payer: No Typology Code available for payment source

## 2011-02-18 DIAGNOSIS — J309 Allergic rhinitis, unspecified: Secondary | ICD-10-CM

## 2011-02-18 NOTE — Telephone Encounter (Signed)
Informed pt of appt. Pt stated that she cannot afford to go to this appt due to finances (co-pay $40 and she will have to pay someone to take her to [place]). Pt would like to know if there is anything that she can take anything to "shrink" the stone. Also would like to have x-rays done of both hands. She is having more problems with her hands.Laureen Ochs, Viann Shove

## 2011-02-18 NOTE — Telephone Encounter (Signed)
Please call her regarding urology appt

## 2011-02-18 NOTE — Telephone Encounter (Signed)
Spoke with ms. Barbour and informed her that I have been calling her insurance co all week trying to find out what urologist she can be referred to and I have continuously been on hold with them. I also went online to see if I could find one and the numbers that are listed are all disconected. I informed ms. Aho that I will continue to try and to please be patient.Loralee [place]

## 2011-02-19 ENCOUNTER — Ambulatory Visit: Payer: No Typology Code available for payment source | Admitting: Physical Therapy

## 2011-02-19 NOTE — Telephone Encounter (Signed)
Dear [name] [name] Please tell [name] [name] we will just WATCH the area in [name] [name] kidney with a repeat CT scan in a year. I am not even sure there IS a stone there--just a possibility of a stone. Most likely it will stay there and only cause some occasional issues.  There is nothing an x ray of [name] [name] hands is going to tell me that I do not already know about [name] [name] hands--[name] [name] issues are with teh nerves. THANKS! Denny Levy

## 2011-02-20 ENCOUNTER — Encounter: Payer: Self-pay | Admitting: Internal Medicine

## 2011-02-21 ENCOUNTER — Encounter: Payer: No Typology Code available for payment source | Admitting: Physical Therapy

## 2011-02-21 ENCOUNTER — Telehealth: Payer: Self-pay | Admitting: Family Medicine

## 2011-02-21 NOTE — Telephone Encounter (Signed)
Pt has questions for Tonya - pls call at home

## 2011-02-21 NOTE — Telephone Encounter (Signed)
Called pt and informed her of what Dr. Jennette Kettle suggested and if she has any additional questions to call back.Laureen Ochs, Viann Shove

## 2011-02-25 ENCOUNTER — Encounter: Payer: No Typology Code available for payment source | Admitting: Physical Therapy

## 2011-02-27 ENCOUNTER — Encounter: Payer: Self-pay | Admitting: Family Medicine

## 2011-02-27 ENCOUNTER — Encounter: Payer: No Typology Code available for payment source | Admitting: Physical Therapy

## 2011-02-27 ENCOUNTER — Ambulatory Visit (INDEPENDENT_AMBULATORY_CARE_PROVIDER_SITE_OTHER): Payer: No Typology Code available for payment source | Admitting: Family Medicine

## 2011-02-27 VITALS — Temp 98.4°F | Ht 61.6 in | Wt 186.0 lb

## 2011-02-27 DIAGNOSIS — M546 Pain in thoracic spine: Secondary | ICD-10-CM

## 2011-02-27 DIAGNOSIS — M549 Dorsalgia, unspecified: Secondary | ICD-10-CM

## 2011-02-27 DIAGNOSIS — M545 Low back pain: Secondary | ICD-10-CM

## 2011-02-27 MED ORDER — NAPROXEN 500 MG PO TABS: 500.0000 mg | ORAL_TABLET | Freq: Two times a day (BID) | ORAL | Status: DC

## 2011-02-27 NOTE — Assessment & Plan Note (Signed)
Today seems to be continued MSK pain.  Advised may switch meloxicam to naprosyn which she says she has not taken.  Given options for OTC creams for pain.  Advised to continue PT, reassured her on CT findings not likely to be cause of pain or serious medical complication.  Will continue to monitor with PCP.

## 2011-02-27 NOTE — Patient Instructions (Signed)
I will send naprosyn to your pharmacy if you choose to take it.  I think you should try Aspercreme or capsaicin cream- ask your pharmacist.  Keep follow-up with Dr. Jennette Kettle.

## 2011-02-27 NOTE — Progress Notes (Signed)
  Subjective:    Patient ID: [name] [name], female    DOB: 30-Jan-1951, 60 y.o.   MRN: [id number]  HPI Work in for chronic pain of left flank.    Continued left low back/flank pain.  Was seen by Dr. Jennette Kettle end of may, felt to be more trigger point pain, patient feels she did not get any relief from pain.  Overall she notes pain has been > 1 year in duration.  Workup thus far has included CT which noted possible small left kidney stone.  She has not been able to go to urology referral due to financial/transportation limitations and is worried about her kidney.  Has not taken any medications for this except for tylenol which is not effective.  Did not feel flexeril was effective either.  Pain exacerbated by any movement, she cannot identify any relieving factors.  Review of Systems denies hematuria, polyuria, dysuria, fever, chills, nausea, vomting, abd pain.   Objective:   Physical Exam    GEN: Alert & Oriented, No acute distress CV:  Regular Rate & Rhythm, no murmur Respiratory:  Normal work of breathing, CTAB Abd:  + BS, soft, no tenderness to palpation Back: mildly  TTP left lumbar spine.      Assessment & Plan:

## 2011-03-07 ENCOUNTER — Ambulatory Visit (INDEPENDENT_AMBULATORY_CARE_PROVIDER_SITE_OTHER): Payer: Medicare Other

## 2011-03-07 DIAGNOSIS — J309 Allergic rhinitis, unspecified: Secondary | ICD-10-CM

## 2011-03-13 ENCOUNTER — Ambulatory Visit (INDEPENDENT_AMBULATORY_CARE_PROVIDER_SITE_OTHER): Payer: Medicare Other | Admitting: Family Medicine

## 2011-03-13 ENCOUNTER — Encounter: Payer: Self-pay | Admitting: Family Medicine

## 2011-03-13 DIAGNOSIS — M549 Dorsalgia, unspecified: Secondary | ICD-10-CM

## 2011-03-13 DIAGNOSIS — R93429 Abnormal radiologic findings on diagnostic imaging of unspecified kidney: Secondary | ICD-10-CM

## 2011-03-13 DIAGNOSIS — M546 Pain in thoracic spine: Secondary | ICD-10-CM

## 2011-03-13 DIAGNOSIS — M545 Low back pain, unspecified: Secondary | ICD-10-CM

## 2011-03-13 DIAGNOSIS — R9389 Abnormal findings on diagnostic imaging of other specified body structures: Secondary | ICD-10-CM

## 2011-03-13 DIAGNOSIS — G609 Hereditary and idiopathic neuropathy, unspecified: Secondary | ICD-10-CM

## 2011-03-13 DIAGNOSIS — G8929 Other chronic pain: Secondary | ICD-10-CM

## 2011-03-13 DIAGNOSIS — E119 Type 2 diabetes mellitus without complications: Secondary | ICD-10-CM

## 2011-03-13 DIAGNOSIS — R109 Unspecified abdominal pain: Secondary | ICD-10-CM

## 2011-03-13 NOTE — Progress Notes (Signed)
  Subjective:    Patient ID: [name] [name], female    DOB: 1950-11-25, 59 y.o.   MRN: [id number]  HPI  1) f/u left back  Pain--she wants to see urologist (f/u the questionable stone seen on her CT scan).She is NOT having any blood in her urine, no fever. Just constant left sided back pain for several months. Nothing seems to make it better  2)f/u neuropathy--unchanged. She is wanting to see neurologist. "tired of being in pain". Burning sensation with decreased sensation to soft touch hands and feet--stocking glove pattern 3) gait problems--using her cane full time--unsteady. She thinks PT has done all they can do for her,.  Review of Systems    Pertinent review of systems: negative for fever or unusual weight change. See hpi Objective:   Physical Exam     GENERAL: Well-developed, well-nourished, no acute distress. HEENT: legally blind.Neck no LAD, no carotid bruits. CARDIOVASCULAR: Regular rate and rhythm no murmur gallop or rub LUNGS: Clear to auscultation bilaterally, no rales or wheeze. ABDOMEN: Soft positive bowel sounds NEURO: No gross focal neurological deficits. MSK: Movement of extremity x 4. BACK TTP left thoracolumbar musculature and this reproduces her pain.     Assessment & Plan:  1. Neuropathy--severe with lossof use of her hands essentially. Will do referral to neuro. 2. Back pain--I do NOt think this is nephrolithiasis causing her issues--long discussion--she adamantly wants to see urol. Will set up . She did agree to me also setting up appt for eval nerve block in back. I think this will be more helpful--she is aware it may be 3 months in future as theay are quite backed up

## 2011-03-14 ENCOUNTER — Telehealth: Payer: Self-pay | Admitting: Family Medicine

## 2011-03-14 NOTE — Telephone Encounter (Signed)
Forgot to tell Archie Patten about needing her Diabetic Supplies that she needs.  She need needles - Accucheck, the ones in the drumand Prodigy Test Strips.  If Archie Patten has any question, please give her a call.

## 2011-03-15 ENCOUNTER — Emergency Department (HOSPITAL_COMMUNITY): Payer: Medicare Other

## 2011-03-15 ENCOUNTER — Inpatient Hospital Stay (HOSPITAL_COMMUNITY)
Admission: EM | Admit: 2011-03-15 | Discharge: 2011-03-20 | DRG: 392 | Disposition: A | Discharge status: Home Health | Payer: Medicare Other | Source: Ambulatory Visit | Attending: Family Medicine | Admitting: Family Medicine

## 2011-03-15 ENCOUNTER — Encounter (HOSPITAL_COMMUNITY): Payer: Self-pay | Admitting: Radiology

## 2011-03-15 ENCOUNTER — Encounter: Payer: Self-pay | Admitting: Emergency Medicine

## 2011-03-15 DIAGNOSIS — E785 Hyperlipidemia, unspecified: Secondary | ICD-10-CM | POA: Diagnosis present

## 2011-03-15 DIAGNOSIS — Z794 Long term (current) use of insulin: Secondary | ICD-10-CM

## 2011-03-15 DIAGNOSIS — R5381 Other malaise: Secondary | ICD-10-CM | POA: Diagnosis present

## 2011-03-15 DIAGNOSIS — Z87891 Personal history of nicotine dependence: Secondary | ICD-10-CM

## 2011-03-15 DIAGNOSIS — Z7982 Long term (current) use of aspirin: Secondary | ICD-10-CM

## 2011-03-15 DIAGNOSIS — E119 Type 2 diabetes mellitus without complications: Secondary | ICD-10-CM | POA: Diagnosis present

## 2011-03-15 DIAGNOSIS — H409 Unspecified glaucoma: Secondary | ICD-10-CM | POA: Diagnosis present

## 2011-03-15 DIAGNOSIS — G589 Mononeuropathy, unspecified: Secondary | ICD-10-CM | POA: Diagnosis present

## 2011-03-15 DIAGNOSIS — M109 Gout, unspecified: Secondary | ICD-10-CM | POA: Diagnosis present

## 2011-03-15 DIAGNOSIS — H269 Unspecified cataract: Secondary | ICD-10-CM | POA: Diagnosis present

## 2011-03-15 DIAGNOSIS — R059 Cough, unspecified: Secondary | ICD-10-CM | POA: Diagnosis present

## 2011-03-15 DIAGNOSIS — K219 Gastro-esophageal reflux disease without esophagitis: Secondary | ICD-10-CM | POA: Diagnosis present

## 2011-03-15 DIAGNOSIS — F329 Major depressive disorder, single episode, unspecified: Secondary | ICD-10-CM | POA: Diagnosis present

## 2011-03-15 DIAGNOSIS — I10 Essential (primary) hypertension: Secondary | ICD-10-CM | POA: Diagnosis present

## 2011-03-15 DIAGNOSIS — R05 Cough: Secondary | ICD-10-CM | POA: Diagnosis present

## 2011-03-15 DIAGNOSIS — F3289 Other specified depressive episodes: Secondary | ICD-10-CM | POA: Diagnosis present

## 2011-03-15 DIAGNOSIS — K5732 Diverticulitis of large intestine without perforation or abscess without bleeding: Principal | ICD-10-CM | POA: Diagnosis present

## 2011-03-15 LAB — CBC
HCT: 35.7 % — ABNORMAL LOW (ref 36.0–46.0)
Hemoglobin: 11.6 g/dL — ABNORMAL LOW (ref 12.0–15.0)
MCH: 27.6 pg (ref 26.0–34.0)
MCHC: 32.5 g/dL (ref 30.0–36.0)
MCV: 84.8 fL (ref 78.0–100.0)
Platelets: 169 10*3/uL (ref 150–400)
RBC: 4.21 MIL/uL (ref 3.87–5.11)
RDW: 13.9 % (ref 11.5–15.5)
WBC: 9.7 10*3/uL (ref 4.0–10.5)

## 2011-03-15 LAB — URINALYSIS, ROUTINE W REFLEX MICROSCOPIC
Bilirubin Urine: NEGATIVE
Glucose, UA: NEGATIVE mg/dL
Hgb urine dipstick: NEGATIVE
Ketones, ur: NEGATIVE mg/dL
Nitrite: NEGATIVE
Protein, ur: 30 mg/dL — AB
Specific Gravity, Urine: 1.018 (ref 1.005–1.030)
Urobilinogen, UA: 1 mg/dL (ref 0.0–1.0)
pH: 8 (ref 5.0–8.0)

## 2011-03-15 LAB — COMPREHENSIVE METABOLIC PANEL
ALT: 24 U/L (ref 0–35)
AST: 22 U/L (ref 0–37)
Albumin: 3.4 g/dL — ABNORMAL LOW (ref 3.5–5.2)
Alkaline Phosphatase: 112 U/L (ref 39–117)
BUN: 8 mg/dL (ref 6–23)
CO2: 28 mEq/L (ref 19–32)
Calcium: 9.6 mg/dL (ref 8.4–10.5)
Chloride: 106 mEq/L (ref 96–112)
Creatinine, Ser: 0.62 mg/dL (ref 0.50–1.10)
GFR calc Af Amer: >60 mL/min (ref >60)
GFR calc non Af Amer: >60 mL/min (ref >60)
Glucose, Bld: 110 mg/dL — ABNORMAL HIGH (ref 70–99)
Potassium: 3.9 mEq/L (ref 3.5–5.1)
Sodium: 144 mEq/L (ref 135–145)
Total Bilirubin: 0.3 mg/dL (ref 0.3–1.2)
Total Protein: 7.1 g/dL (ref 6.0–8.3)

## 2011-03-15 LAB — URINE MICROSCOPIC-ADD ON

## 2011-03-15 LAB — DIFFERENTIAL
Basophils Absolute: 0 10*3/uL (ref 0.0–0.1)
Basophils Relative: 0 % (ref 0–1)
Eosinophils Absolute: 0.1 10*3/uL (ref 0.0–0.7)
Eosinophils Relative: 1 % (ref 0–5)
Lymphocytes Relative: 28 % (ref 12–46)
Lymphs Abs: 2.7 10*3/uL (ref 0.7–4.0)
Monocytes Absolute: 0.7 10*3/uL (ref 0.1–1.0)
Monocytes Relative: 7 % (ref 3–12)
Neutro Abs: 6.2 10*3/uL (ref 1.7–7.7)
Neutrophils Relative %: 64 % (ref 43–77)

## 2011-03-15 LAB — LIPASE, BLOOD: Lipase: 30 U/L (ref 11–59)

## 2011-03-15 LAB — TROPONIN I: Troponin I: <0.3 ng/mL (ref <0.30)

## 2011-03-15 MED ORDER — IOHEXOL 300 MG/ML  SOLN
100.0000 mL | Freq: Once | INTRAMUSCULAR | Status: AC | PRN
  Administered 2011-03-15: 100 mL via INTRAVENOUS

## 2011-03-15 NOTE — H&P (Unsigned)
[name] [name] is an 60 y.o. female.   Chief Complaint: Abdominal pain HPI: [name] [name] is a 60 year old female with history of DM, HTN, GERD presenting with a one day history of intermittent, sharp, crampy lower abdominal pain.  She describes two episodes of diarrhea early in the day, but none since.  To the best of her knowledge the diarrhea was non-bloody, but she has poor vision due to congenital cataracts and glaucoma.  Denies nausea and vomiting.  Expresses a good appetite, but scared to eat with the pain.  Denies fever, chills, and relation of the pain to food.  She had a colonoscopy in April of 2012 at which time they removed one polyp but made no mention to her of diverticulae.   In the ED she had a CT abd/pelvis with contrast that showed some colonic stranding and diverticula.   Past Medical History  Diagnosis Date  . Chronic cough   . Sleep apnea     on CPAP  . HTN (hypertension)   . Dyslipidemia   . Diabetes mellitus   . Gout   . Vocal cord paresis   . History of colonoscopy     Past Surgical History  Procedure Date  . Colonoscopy w/ biopsies 11/21/2010    adenoma polyp--no hi grade dysplasia Dr [address]  . Treatment fistula anal   . Breast reduction surgery 04-21-2003  . Tubal ligation 1987  . Cardiac catheterization 03-02-2004  . Total abdominal hysterectomy 10-07-2000  . Rotator cuff repair 10-18-2003  . Bronchoscopy 07-2007  . Carpal tunnel release 2011  . Knee arthroscopy 08/2000    right  . Shoulder arthroscopy 08/2000    right  . Btl     Family History  Problem Relation Age of Onset  . Diabetes Mother   . Colon cancer Neg Hx    Social History: Former smoker.  Denies drug and alcohol use.  Lives alone.   Allergies:  Allergies  Allergen Reactions  . Amlodipine Besylate     REACTION: swelling  . Lisinopril-Hydrochlorothiazide     REACTION: cough-- changed to arb  . Metronidazole     REACTION: unspecified  . Propoxyphene Hcl     REACTION: unspecified  .  Valsartan     REACTION: unspecified    Medications Prior to Admission  Medication Dose Route Frequency Provider Last Rate Last Dose  . iohexol (OMNIPAQUE) 300 MG/ML injection 100 mL  100 mL Intravenous Once PRN Medication Radiologist   100 mL at 03/15/11 1909   Medications Prior to Admission  Medication Sig Dispense Refill  . acyclovir (ZOVIRAX) 400 MG tablet Take 800 mg by mouth 2 (two) times daily.        [name] albuterol (PROAIR HFA) 108 (90 BASE) MCG/ACT inhaler Inhale 2 puffs into the lungs every 4 (four) hours as needed.        [name] allopurinol (ZYLOPRIM) 300 MG tablet Take 300 mg by mouth daily.        [name] arformoterol (BROVANA) 15 MCG/2ML NEBU Take 15 mcg by nebulization 2 (two) times daily as needed.        [name] aspirin 325 MG tablet Take 325 mg by mouth daily.        [name] azelastine (ASTELIN) 137 MCG/SPRAY nasal spray 2 sprays by Nasal route daily as needed. Use in each nostril as directed       . beclomethasone (QVAR) 80 MCG/ACT inhaler Inhale 2 puffs into the lungs 2 (two) times daily.        [name]  brimonidine (ALPHAGAN P) 0.1 % SOLN Place 1 drop into the left eye 2 (two) times daily.        . cyclobenzaprine (FLEXERIL) 5 MG tablet 1 by mouth at bedtime as needed spasm       . esomeprazole (NEXIUM) 40 MG capsule Take 40 mg by mouth 2 (two) times daily.        . furosemide (LASIX) 20 MG tablet Take 20 mg by mouth daily.        . insulin glargine (LANTUS) 100 UNIT/ML injection 40units Subcutaneously daily disp qs 1 m in solostar pen this replaces 70-30 mix       . latanoprost (XALATAN) 0.005 % ophthalmic solution Place 1 drop into both eyes at bedtime.        [name] losartan-hydrochlorothiazide (HYZAAR) 100-25 MG per tablet Take 1 tablet by mouth daily.        . metFORMIN (GLUCOPHAGE) 1000 MG tablet Take 1.5 tablets by mouth in the am and 1 tablets in the PM       . metoprolol (TOPROL-XL) 50 MG 24 hr tablet Take 1 tablet (50 mg total) by mouth 2 (two) times daily.  60 tablet  6  . montelukast (SINGULAIR)  10 MG tablet Take 10 mg by mouth at bedtime.        . naproxen (NAPROSYN) 500 MG tablet Take 1 tablet (500 mg total) by mouth 2 (two) times daily with a meal.  60 tablet  0  . potassium chloride 40 MEQ/15ML (20%) LIQD Take 15 ml ONCE dialy      . pregabalin (LYRICA) 100 MG capsule Take one by mouth in morning and two at night  90 capsule  12  . sertraline (ZOLOFT) 100 MG tablet Take 150 mg by mouth daily.        . simvastatin (ZOCOR) 40 MG tablet Take 40 mg by mouth daily.          Results for orders placed during the hospital encounter of 03/15/11 (from the past 48 hour(s))  URINALYSIS, ROUTINE W REFLEX MICROSCOPIC     Status: Abnormal   Collection Time   03/15/11  2:49 PM      Component Value Range Comment   Color, Urine YELLOW  YELLOW     Appearance CLEAR  CLEAR     Specific Gravity, Urine 1.018  1.005 - 1.030     pH 8.0  5.0 - 8.0     Glucose, UA NEGATIVE  NEGATIVE (mg/dL)    Hgb urine dipstick NEGATIVE  NEGATIVE     Bilirubin Urine NEGATIVE  NEGATIVE     Ketones NEGATIVE  NEGATIVE (mg/dL)    Protein 30 (*) NEGATIVE (mg/dL)    Urobilinogen, UA 1.0  0.0 - 1.0 (mg/dL)    Nitrite NEGATIVE  NEGATIVE     Leukocytes, UA TRACE (*) NEGATIVE    URINE MICROSCOPIC-ADD ON     Status: Normal   Collection Time   03/15/11  2:49 PM      Component Value Range Comment   Squamous Epithelial / LPF RARE  RARE     WBC, UA 0-2  <3 (WBC/hpf)    RBC / HPF 0-2  <3 (RBC/hpf)   DIFFERENTIAL WITH WBC     Status: Normal   Collection Time   03/15/11  3:23 PM      Component Value Range Comment   Neutrophils Relative 64  43 - 77 (%)    Neutro Abs 6.2  1.7 - 7.7 (K/uL)  Lymphocytes Relative 28  12 - 46 (%)    Lymphs Abs 2.7  0.7 - 4.0 (K/uL)    Monocytes Relative 7  3 - 12 (%)    Monocytes Absolute 0.7  0.1 - 1.0 (K/uL)    Eosinophils Relative 1  0 - 5 (%)    Eosinophils Absolute 0.1  0.0 - 0.7 (K/uL)    Basophils Relative 0  0 - 1 (%)    Basophils Absolute 0.0  0.0 - 0.1 (K/uL)   CBC     Status:  Abnormal   Collection Time   03/15/11  3:23 PM      Component Value Range Comment   WBC 9.7  4.0 - 10.5 (K/uL)    RBC 4.21  3.87 - 5.11 (MIL/uL)    Hemoglobin 11.6 (*) 12.0 - 15.0 (g/dL)    HCT 16.1 (*) 09.6 - 46.0 (%)    MCV 84.8  78.0 - 100.0 (fL)    MCH 27.6  26.0 - 34.0 (pg)    MCHC 32.5  30.0 - 36.0 (g/dL)    RDW 04.5  40.9 - 81.1 (%)    Platelets 169  150 - 400 (K/uL)   TROPONIN I     Status: Normal   Collection Time   03/15/11  3:23 PM      Component Value Range Comment   Troponin I <0.30  <0.30 (ng/mL)   COMPREHENSIVE METABOLIC PANEL     Status: Abnormal   Collection Time   03/15/11  3:23 PM      Component Value Range Comment   Sodium 144  135 - 145 (mEq/L)    Potassium 3.9  3.5 - 5.1 (mEq/L)    Chloride 106  96 - 112 (mEq/L)    CO2 28  19 - 32 (mEq/L)    Glucose, Bld 110 (*) 70 - 99 (mg/dL)    BUN 8  6 - 23 (mg/dL)    Creatinine, Ser 9.14  0.50 - 1.10 (mg/dL) **Please note change in reference range.**   Calcium 9.6  8.4 - 10.5 (mg/dL)    Total Protein 7.1  6.0 - 8.3 (g/dL)    Albumin 3.4 (*) 3.5 - 5.2 (g/dL)    AST 22  0 - 37 (U/L)    ALT 24  0 - 35 (U/L)    Alkaline Phosphatase 112  39 - 117 (U/L)    Total Bilirubin 0.3  0.3 - 1.2 (mg/dL)    GFR calc non Af Amer >60  >60 (mL/min)    GFR calc Af Amer >60  >60 (mL/min)   LIPASE, BLOOD     Status: Normal   Collection Time   03/15/11  3:23 PM      Component Value Range Comment   Lipase 30  11 - 59 (U/L)    CT abd/pelvis: pericolonic inflammatory changes, most likely due to diverticulitis.  Mild hepatic steatosis.   Review of Systems  Constitutional: Negative for fever and chills.  Respiratory: Negative for cough and shortness of breath.   Cardiovascular: Negative for chest pain, palpitations and leg swelling.  Gastrointestinal: Positive for nausea, abdominal pain and diarrhea. Negative for vomiting and blood in stool.  Skin: Negative for rash.  Neurological: Positive for weakness. Negative for headaches.    Vitals:  T 98.1 P 60 R 16 BP 122/50 SaO2 98% RA  Physical Exam  Constitutional: She is oriented to person, place, and time. She appears well-developed and well-nourished. No distress.  HENT:  Head: Normocephalic and  atraumatic.  Mouth/Throat: Oropharynx is clear and moist.  Eyes: EOM are normal. No scleral icterus. Right pupil is not reactive. Left pupil is not reactive.  Cardiovascular: Normal rate and regular rhythm.   Murmur heard. Respiratory: Effort normal and breath sounds normal. She has no wheezes. She has no rales.  GI: Soft. Bowel sounds are normal. She exhibits no distension and no mass. There is tenderness. There is no rebound and no guarding.  Neurological: She is alert and oriented to person, place, and time. She exhibits normal muscle tone.  Skin: Skin is warm and dry. No rash noted.     Assessment/Plan 60 year old woman with history of DM, HTN, HLD, GERD admitted for diverticulitis  1. Diverticulitis: She is hemodynamically stable, afebrile and without a white count.  However there is stranding on CT scan.  We will start her on a clear diet.  We will also start her on Clindamycin and Cipro as she is allergic to Flagyl.  We will give IV morphine 2mg  q2hrs for pain control.  2. HTN: Her BP has been fluctuating quite a bit in the ED, but she has not been hypotensive.  We will continue her home medications.  3. Hyperlidemia: we will continue her home medications  4. GERD: we will continue her home medications  5. DM: we will start her on half her lantus dose as she is currently not taking much PO.  Her metformin will be held for 24hours due to the CT scan with contrast.  6. Neuropathy: we will continue her home medications  7. Depression: we will continue her home medications  8.  FEN/GI: we will start her on clears tonight.  Continue maintanence IVFs with NS.  9. PPx: SQ heparin and protonix  10. Dispo: pending improvement  BOOTH, Sabreen Kitchen 03/15/2011, 9:17  PM

## 2011-03-16 DIAGNOSIS — E1049 Type 1 diabetes mellitus with other diabetic neurological complication: Secondary | ICD-10-CM

## 2011-03-16 DIAGNOSIS — K5732 Diverticulitis of large intestine without perforation or abscess without bleeding: Secondary | ICD-10-CM

## 2011-03-16 DIAGNOSIS — E118 Type 2 diabetes mellitus with unspecified complications: Secondary | ICD-10-CM

## 2011-03-16 DIAGNOSIS — R269 Unspecified abnormalities of gait and mobility: Secondary | ICD-10-CM

## 2011-03-16 LAB — CBC
HCT: 34.9 % — ABNORMAL LOW (ref 36.0–46.0)
Hemoglobin: 11.1 g/dL — ABNORMAL LOW (ref 12.0–15.0)
MCH: 26.9 pg (ref 26.0–34.0)
MCHC: 31.8 g/dL (ref 30.0–36.0)
MCV: 84.5 fL (ref 78.0–100.0)
Platelets: 178 10*3/uL (ref 150–400)
RBC: 4.13 MIL/uL (ref 3.87–5.11)
RDW: 14 % (ref 11.5–15.5)
WBC: 8.5 10*3/uL (ref 4.0–10.5)

## 2011-03-16 LAB — GLUCOSE, CAPILLARY
Glucose-Capillary: 102 mg/dL — ABNORMAL HIGH (ref 70–99)
Glucose-Capillary: 129 mg/dL — ABNORMAL HIGH (ref 70–99)
Glucose-Capillary: 228 mg/dL — ABNORMAL HIGH (ref 70–99)
Glucose-Capillary: 99 mg/dL (ref 70–99)
Glucose-Capillary: 99 mg/dL (ref 70–99)

## 2011-03-16 LAB — BASIC METABOLIC PANEL
BUN: 5 mg/dL — ABNORMAL LOW (ref 6–23)
CO2: 26 mEq/L (ref 19–32)
Calcium: 9.2 mg/dL (ref 8.4–10.5)
Chloride: 104 mEq/L (ref 96–112)
Creatinine, Ser: 0.54 mg/dL (ref 0.50–1.10)
GFR calc Af Amer: >60 mL/min (ref >60)
GFR calc non Af Amer: >60 mL/min (ref >60)
Glucose, Bld: 136 mg/dL — ABNORMAL HIGH (ref 70–99)
Potassium: 3.6 mEq/L (ref 3.5–5.1)
Sodium: 139 mEq/L (ref 135–145)

## 2011-03-17 LAB — GLUCOSE, CAPILLARY
Glucose-Capillary: 110 mg/dL — ABNORMAL HIGH (ref 70–99)
Glucose-Capillary: 189 mg/dL — ABNORMAL HIGH (ref 70–99)
Glucose-Capillary: 137 mg/dL — ABNORMAL HIGH (ref 70–99)
Glucose-Capillary: 167 mg/dL — ABNORMAL HIGH (ref 70–99)

## 2011-03-18 ENCOUNTER — Other Ambulatory Visit: Payer: Self-pay | Admitting: *Deleted

## 2011-03-18 LAB — CBC
HCT: 31.9 % — ABNORMAL LOW (ref 36.0–46.0)
Hemoglobin: 10.1 g/dL — ABNORMAL LOW (ref 12.0–15.0)
MCH: 26.9 pg (ref 26.0–34.0)
MCHC: 31.7 g/dL (ref 30.0–36.0)
MCV: 84.8 fL (ref 78.0–100.0)
Platelets: 186 10*3/uL (ref 150–400)
RBC: 3.76 MIL/uL — ABNORMAL LOW (ref 3.87–5.11)
RDW: 13.9 % (ref 11.5–15.5)
WBC: 6.3 10*3/uL (ref 4.0–10.5)

## 2011-03-18 LAB — GLUCOSE, CAPILLARY
Glucose-Capillary: 110 mg/dL — ABNORMAL HIGH (ref 70–99)
Glucose-Capillary: 234 mg/dL — ABNORMAL HIGH (ref 70–99)
Glucose-Capillary: 111 mg/dL — ABNORMAL HIGH (ref 70–99)

## 2011-03-18 LAB — C-REACTIVE PROTEIN: CRP: 3.4 mg/dL — ABNORMAL HIGH (ref <0.6)

## 2011-03-18 LAB — BASIC METABOLIC PANEL
BUN: 9 mg/dL (ref 6–23)
CO2: 30 mEq/L (ref 19–32)
Calcium: 9.2 mg/dL (ref 8.4–10.5)
Chloride: 104 mEq/L (ref 96–112)
Creatinine, Ser: 0.79 mg/dL (ref 0.50–1.10)
GFR calc Af Amer: >60 mL/min (ref >60)
GFR calc non Af Amer: >60 mL/min (ref >60)
Glucose, Bld: 116 mg/dL — ABNORMAL HIGH (ref 70–99)
Potassium: 3.9 mEq/L (ref 3.5–5.1)
Sodium: 142 mEq/L (ref 135–145)

## 2011-03-18 LAB — SEDIMENTATION RATE: Sed Rate: 56 mm/hr — ABNORMAL HIGH (ref 0–22)

## 2011-03-18 NOTE — Telephone Encounter (Signed)
Called to verify exactly what supplies are needed.Marland KitchenMarland KitchenLoralee Pacas [place]

## 2011-03-19 LAB — GLUCOSE, CAPILLARY
Glucose-Capillary: 141 mg/dL — ABNORMAL HIGH (ref 70–99)
Glucose-Capillary: 136 mg/dL — ABNORMAL HIGH (ref 70–99)
Glucose-Capillary: 204 mg/dL — ABNORMAL HIGH (ref 70–99)
Glucose-Capillary: 172 mg/dL — ABNORMAL HIGH (ref 70–99)

## 2011-03-19 NOTE — Telephone Encounter (Signed)
Needing strips for prodigy and accu check lancets that come 6 to a drum.Laureen Ochs, Viann Shove

## 2011-03-20 LAB — GLUCOSE, CAPILLARY
Glucose-Capillary: 168 mg/dL — ABNORMAL HIGH (ref 70–99)
Glucose-Capillary: 112 mg/dL — ABNORMAL HIGH (ref 70–99)
Glucose-Capillary: 148 mg/dL — ABNORMAL HIGH (ref 70–99)

## 2011-03-21 ENCOUNTER — Telehealth: Payer: Self-pay | Admitting: Family Medicine

## 2011-03-21 NOTE — Telephone Encounter (Signed)
Asking to speak with Dr. Elwyn Reach, wants to thank her for her help and to also apologize to her.

## 2011-03-21 NOTE — Telephone Encounter (Signed)
Forward to Dr Elwyn Reach

## 2011-03-22 NOTE — H&P (Signed)
[name] [name], [name] [name]              ACCOUNT NO.:  [id number]  MEDICAL RECORD NO.:  [id number]  LOCATION:  [facility]                         FACILITY:  [facility]  PHYSICIAN:  Leighton Roach McDiarmid, M.D.DATE OF BIRTH:  05-16-1951  DATE OF ADMISSION:  03/15/2011 DATE OF DISCHARGE:                             HISTORY & PHYSICAL   CHIEF COMPLAINT:  Abdominal pain.  HISTORY OF PRESENT ILLNESS:  [name] is a 60 year old female with a history of diabetes, hypertension, and GERD presenting with a 1-day history of intermittent sharp, crampy lower abdominal pain.  She describes 2 episodes of diarrhea early in the day, but none since.  To the best of her knowledge, the diarrhea was nonbloody but she has poor vision due to congenital cataracts and glaucoma.  She denies nausea and vomiting.  Does have a good appetite, but has been scared to eat with the pain.  Denies fever or chills and any relation of the pain to food. She had a colonoscopy in April 2012 at which time they removed one polyp, but made no mention to her of diverticula.  In the ED, she had a CT abdomen and pelvis as part of her workup and that showed colonic stranding and diverticula consistent with diverticulitis.  PAST MEDICAL HISTORY: 1. Hypertension. 2. Dyslipidemia. 3. Diabetes. 4. Gout. 5. Chronic cough. 6. Depression. 7. Glaucoma. 8. Congenital cataracts.  PAST SURGICAL HISTORY:  Colonoscopy with biopsies, last one in April 2012; breast reduction surgery; abdominal hysterectomy; arthroscopic shoulder; arthroscopic knee.  FAMILY HISTORY:  Positive for diabetes in her mother and hypertension in her father.  No history of cancer.  SOCIAL HISTORY:  She is a former smoker.  Denies drug and alcohol use. She lives alone.  ALLERGIES:  She is allergic to AMLODIPINE, LISINOPRIL, HYDROCHLOROTHIAZIDE, METRONIDAZOLE, PROPOXYPHENE, and VALSARTAN.  MEDICATIONS PRIOR TO ADMISSION: 1. Acyclovir 400 mg take 2 tabs daily. 2. Albuterol 2  puffs every 4 hours as needed. 3. Allopurinol 300 mg daily. 4. Arformoterol nebulizer 2 times daily as needed. 5. Aspirin 325 mg daily. 6. Azelastine nasal spray as needed. 7. Beclomethasone 2 puffs twice a day. 8. Brimonidine 0.1% solution 1 drop into the left eye 2 times daily. 9. Cyclobenzaprine 5 mg tablets one tab at bedtime as needed for     spasm. 10.Nexium 40 mg twice daily. 11.Lasix 20 mg daily. 12.Lantus 40 units subcutaneously daily. 13.Latanoprost 0.005% ophthalmic solution 1 drop in both eyes at     bedtime. 14.Losartan and hydrochlorothiazide combination pill 100/25 mg daily. 15.Metformin 1000 mg 1-1/2 tablets in the morning and 1 tablet in the     evening. 16.Metoprolol 50 mg twice daily. 17.Singulair 10 mg daily at bedtime. 18.Naproxen 500 mg 1 tablet twice daily with meals. 19.Potassium chloride 40 mEq daily. 20.Pregabalin 100 mg 1 tab by mouth in the morning and 2 at night. 21.Zoloft 100 mg to take 1-1/2 tabs daily. 22.Simvastatin 40 mg daily.  REVIEW OF SYSTEMS:  Negative except as in HPI.  PHYSICAL EXAMINATION:  VITAL SIGNS:  Temperature 98.1, pulse 60, respiratory rate 16, blood pressure 122/50, O2 saturation 98% on room air. GENERAL:  She is a well-developed, well-nourished woman lying comfortably in bed in no distress.  She is oriented to person, place, and time. HEENT:  She is normocephalic and atraumatic.  Oropharynx is clear and moist.  Extraocular movements are intact.  No scleral icterus.  Pupils are not reactive. CARDIOVASCULAR:  Normal rate and rhythm.  She does have a 2/6 systolic ejection murmur heard best at the left upper sternal border. RESPIRATORY:  Chest clear to auscultation bilaterally.  No increased work of breathing. ABDOMEN:  Soft, bowel sounds are normal.  There is no distention.  She does have some diffuse tenderness to palpation, worse in the lower quadrants.  No rebound and no guarding. NEUROLOGIC:  She is alert and oriented.   Normal muscle tone, 5/5 strength in all 4 extremities. SKIN:  Skin is warm and dry.  No rashes noted.  No ulcers on her feet.  LABORATORY DATA:  A urinalysis showed trace leukocytes, 30 of protein, otherwise negative.  Microscopy was normal.  A CBC on her showed a white count of 9.7, hemoglobin 11.6, hematocrit 35.7, platelets 169.  A comprehensive metabolic panel showed sodium 144, potassium 3.9, chloride 106, bicarb 28, BUN 8, creatinine 0.62, glucose 110, calcium 9.6, total protein 7.1, albumin 3.4, AST 22, ALT 24, alk phos 112, total bilirubin 0.3.  A lipase was normal at 30 and troponin's were negative.  The CT abdomen and pelvis with contrast showed pericolonic inflammatory changes most likely due to diverticulitis.  There are many diverticula noted in the area and some mild hepatic steatosis.  ASSESSMENT AND PLAN:  This is a 60 year old woman with a history of diabetes, hypertension, hyperlipidemia, and gastroesophageal reflux disease, admitted for diverticulitis. 1. Diverticulitis.  She is hemodynamically stable, afebrile, and     without a white count; however, there is stranding on CT scan.  We     will start her on a clear liquid diet.  We will also start her on     clindamycin and Cipro as she is allergic to FLAGYL.  We will give     IV morphine 2 mg every 2 hours for pain control. 2. Hypertension.  Her blood pressure has been fluctuating in the ED,     but she has not been hypotensive.  We will continue her home     medications. 3. Hyperlipidemia.  We will continue her home medications. 4. Gastroesophageal reflux disease.  We will continue her home     medications. 5. Diabetes.  We will start her on half her Lantus dose as she is     currently not taking much p.o.  Her metformin will be held for 48     hours due to the CT scan with contrast. 6. Neuropathy.  We will continue her home medicines. 7. Depression.  We will continue her home medications. 8. FEN/GI.  We will  start her on clears tonight and continue     maintenance IV fluids with normal saline  9. Prophylaxis.  She will receive subcu heparin and Protonix.  Disposition is pending improvement.    ______________________________ [name] Hick, MD   ______________________________ Leighton Roach McDiarmid, M.D.    EB/MEDQ  D:  03/15/2011  T:  03/16/2011  Job:  [id number]  Electronically Signed by [name] Hick MD on 03/17/2011 12:48:35 PM Electronically Signed by Acquanetta Belling M.D. on 03/22/2011 04:51:39 PM

## 2011-03-25 ENCOUNTER — Other Ambulatory Visit: Payer: Self-pay | Admitting: Family Medicine

## 2011-03-25 DIAGNOSIS — Z1231 Encounter for screening mammogram for malignant neoplasm of breast: Secondary | ICD-10-CM

## 2011-03-25 NOTE — Telephone Encounter (Signed)
I spoke with [name] [name] this afternoon.  She expressed some concern that she had offended me as I was not present when she was discharged.  I explained that that was my day off, and that I have enjoyed being part of her care while in the hospital.

## 2011-04-01 NOTE — Discharge Summary (Signed)
[name] [name], [name] [name]              ACCOUNT NO.:  [id number]  MEDICAL RECORD NO.:  [id number]  LOCATION:  [facility]                         FACILITY:  [facility]  PHYSICIAN:  Dr. Jennette Kettle               DATE OF BIRTH:  Feb 12, 1951  DATE OF ADMISSION:  03/15/2011 DATE OF DISCHARGE:  03/20/2011                              DISCHARGE SUMMARY   PRIMARY CARE PROVIDER:  Dr. Jennette Kettle.  CONSULTS:  None.  REASON FOR ADMISSION:  Diverticulitis.  DISCHARGE DIAGNOSES: 1. Diverticulitis. 2. Diabetes. 3. Hypertension.  DISCHARGE MEDICATIONS:  New: 1. Ciprofloxacin 500 mg twice daily for 3 days. 2. Clindamycin 450 mg 3 times daily for 3 days.  Continued Medications: 1. Acyclovir 400 mg twice daily. 2. Allopurinol 300 mg every morning. 3. Alphagan P 0.1% solution 1 drop in the left eye twice daily. 4. Aspirin 325 mg daily. 5. Brovana 15 mcg in 2 mL 1 nebulization twice daily as needed. 6. Flexeril 5 mg 1 tablet daily at bedtime as needed. 7. Hyzaar 100/25 mg 1 tablet daily. 8. Lantus 40 units subcu daily. 9. Lasix 20 mg daily. 10.Lyrica 100 mg 1 capsule in the morning and 2 in the evening. 11.Metformin 1000 mg 1-1/2 tablets in the morning and 1 tablet in the     evening. 12.Metoprolol XL 50 mg twice daily. 13.Nexium 40 mg twice daily. 14.Potassium chloride 40 mEq twice daily. 15.ProAir 2 puffs every 4 hours as needed. 16.QVAR 80 mcg 2 puffs twice daily as needed. 17.Simvastatin 40 mg daily. 18.Singulair 10 mg daily. 19.Xalatan 0.005% ophthalmic solution 1 drop in both eyes at bedtime. 20.Zoloft 100 mg 1-1/2 tablets at bedtime.  HOSPITAL COURSE:  [name] is a 60 year old woman with multiple medical problems, who presented to the emergency room with a 24-hour history of intermittent, sharp, crampy abdominal pain.  She was found to have mild diverticulitis on abdominal CT with no white count or fever. 1. Diverticulitis.  The patient was initially started on IV     ciprofloxacin and clindamycin and  made n.p.o.  Her diet was     advanced slowly and she was transitioned to p.o. antibiotics.  She     never developed a fever or white count.  As she noted some     weakness, Physical Therapy was consulted and recommended home     health physical therapy and home health aide.  She was discharged     with 3 days worth of antibiotics to complete a 7-day course. 2. Diabetes.  She is put on half of her Lantus dose with a sensitive     sliding scale.  Her insulin requirement increased as she started     taking p.o. again and she was discharged on her home regimen. 3. Hypertension.  Her blood pressure remained well controlled during     this hospitalization on her home medications.  CONDITION AT DISCHARGE:  Stable.  PENDING TESTS:  None.  DISPOSITION:  To home.  FOLLOWUP:  With Dr. Jennette Kettle at [facility] on April 03, 2011, at 11 a.m.  FOLLOWUP ISSUES:  Please make sure she completed her antibiotic course and that her  symptoms have resolved.    ______________________________ [name] Hick, MD   ______________________________ Dr. Jennette Kettle    EB/MEDQ  D:  03/21/2011  T:  03/21/2011  Job:  [id number]  Electronically Signed by Denny Peon BOOTH MD on 03/21/2011 12:15:18 PM Electronically Signed by Denny Levy MD on 04/01/2011 10:55:10 AM

## 2011-04-02 ENCOUNTER — Encounter: Payer: Self-pay | Admitting: Internal Medicine

## 2011-04-03 ENCOUNTER — Encounter: Payer: Self-pay | Admitting: Family Medicine

## 2011-04-03 ENCOUNTER — Ambulatory Visit (INDEPENDENT_AMBULATORY_CARE_PROVIDER_SITE_OTHER): Payer: Medicare Other

## 2011-04-03 ENCOUNTER — Ambulatory Visit (INDEPENDENT_AMBULATORY_CARE_PROVIDER_SITE_OTHER): Payer: Medicare Other | Admitting: Family Medicine

## 2011-04-03 VITALS — BP 134/70 | HR 74 | Temp 97.6°F | Wt 179.0 lb

## 2011-04-03 DIAGNOSIS — J309 Allergic rhinitis, unspecified: Secondary | ICD-10-CM

## 2011-04-03 DIAGNOSIS — G609 Hereditary and idiopathic neuropathy, unspecified: Secondary | ICD-10-CM

## 2011-04-03 DIAGNOSIS — K5732 Diverticulitis of large intestine without perforation or abscess without bleeding: Secondary | ICD-10-CM

## 2011-04-03 DIAGNOSIS — E119 Type 2 diabetes mellitus without complications: Secondary | ICD-10-CM

## 2011-04-03 DIAGNOSIS — K5792 Diverticulitis of intestine, part unspecified, without perforation or abscess without bleeding: Secondary | ICD-10-CM

## 2011-04-03 LAB — POCT GLYCOSYLATED HEMOGLOBIN (HGB A1C): Hemoglobin A1C: 6.6

## 2011-04-03 NOTE — Patient Instructions (Signed)
STOP LYRICA STOP NEXIUM STOP ZOCOR  START  MAGNESIUM FOR CONSTIPATION  START DEPAKOTE FOR LEG PAIN

## 2011-04-04 ENCOUNTER — Telehealth: Payer: Self-pay | Admitting: Family Medicine

## 2011-04-04 NOTE — Telephone Encounter (Signed)
[name] [name], was in yesterday and was told that Rx's for 2 meds would be sent to [facility], but they have not received the prescriptions that were prescribed yesterday.  She believes it was Depakote and Magnesium.

## 2011-04-05 ENCOUNTER — Encounter: Payer: Self-pay | Admitting: Family Medicine

## 2011-04-05 MED ORDER — MAGNESIUM 80 MG PO TABS: 1.0000 | ORAL_TABLET | ORAL | Status: DC

## 2011-04-05 MED ORDER — DIVALPROEX SODIUM 125 MG PO DR TAB: 125.0000 mg | DELAYED_RELEASE_TABLET | Freq: Every day | ORAL | Status: DC

## 2011-04-05 NOTE — Telephone Encounter (Signed)
Dear [employer] [employer] plz tll her my apologies--I had a computer glitch--I have sent it now [facility]! Denny Levy

## 2011-04-05 NOTE — Telephone Encounter (Signed)
Has called back checking in on these Prescriptions.

## 2011-04-05 NOTE — Telephone Encounter (Signed)
Called [name] [name] to let her know that the meds have been sent to the Pharmacy.

## 2011-04-05 NOTE — Progress Notes (Signed)
  Subjective:    Patient ID: [name] [name], female    DOB: Aug 04, 1951, 60 y.o.   MRN: [id number]  HPI  Neuropathy continues to be an issue---she wants something done. Wants to see neurologist. Lyrica does not seem to help at all  2 F/u abdominal pain---better but she is still having.constipation despite trying to do goood diet. Has tried miralax,otc laxatives, increasing her fiber. Has BM 2-3 times a week now. Stool is normal  3.Wanst to stop some medications--feels she is on too many. Finances a re a problem  4. Follow up diabetes mellitus: Taking all medicines regularly and without problems.Using her own sort of sliding scale and it seems to be working. No episodes of low blood sugar. No blurry vision or increased urination.     Review of Systems    Pertinent review of systems: negative for fever or unusual weight change.  Objective:   Physical Exam  GENERALl: Well developed, well nourished, in no acute distress. NECK: Supple, FROM, without lymphadenopathy.  LUNGS: clear to auscultation bilaterally. No wheezes or rales. HEART: Regular rate and rhythm, no murmurs ABDOMEN: soft with positive bowel sounds NEURO: decreased flexion strength left hand. Decreased sensation stocking glove.        Assessment & Plan:  1. Recent hospitalization for diverticulitis--discussed bowel regimen. She agrees to try magnesium daily (only in pill form0 for constipation. We will start low dose  2. Neuropathy--I will set up neurology appt--she wants it in September--will d/c lyrica and try depakote. 3. Will stop zocor and nexium. She will follow for GERD sx --previously she also had a lot of cough so if that returns she will need to restart. I will check direct LDL in 2 m off zocor.

## 2011-04-08 ENCOUNTER — Encounter: Payer: Self-pay | Admitting: Internal Medicine

## 2011-04-08 ENCOUNTER — Ambulatory Visit (INDEPENDENT_AMBULATORY_CARE_PROVIDER_SITE_OTHER): Payer: Medicare Other | Admitting: Internal Medicine

## 2011-04-08 DIAGNOSIS — I10 Essential (primary) hypertension: Secondary | ICD-10-CM

## 2011-04-08 DIAGNOSIS — E785 Hyperlipidemia, unspecified: Secondary | ICD-10-CM

## 2011-04-08 MED ORDER — NITROGLYCERIN 0.4 MG SL SUBL: 0.4000 mg | SUBLINGUAL_TABLET | SUBLINGUAL | Status: DC | PRN

## 2011-04-08 NOTE — Patient Instructions (Signed)
Your physician wants you to follow-up in: August 2013 with Dr.Ross you will receive a reminder letter in the mail two months in advance. If you don't receive a letter, please call our office to schedule the follow-up appointment.

## 2011-04-09 NOTE — Progress Notes (Addendum)
HPI  Patient is a 73 yer old with a history of HTN, dyslipidemia.  SHe had a normal myoview a couple years ago. Cardiac catheterization in 2005 showed normal coronary arteries.   She presents today for return evaluatation SHe was recently discharged from [facility] where she was treated for diverticulitis. She currently is recovering.  BOwels are still not back to normal She denies chest pains.  Breathing is steady.  She is not that active.  Biggest complaint is the burning in her feet.  She was recently taken of her statin.  Does not know reason.  Allergies  Allergen Reactions  . Amlodipine Besylate     REACTION: swelling  . Lisinopril-Hydrochlorothiazide     REACTION: cough-- changed to arb  . Metronidazole     REACTION: unspecified  . Propoxyphene Hcl     REACTION: unspecified  . Valsartan     REACTION: unspecified    Current Outpatient Prescriptions  Medication Sig Dispense Refill  . acyclovir (ZOVIRAX) 400 MG tablet Take 800 mg by mouth 2 (two) times daily.        [name] albuterol (PROAIR HFA) 108 (90 BASE) MCG/ACT inhaler Inhale 2 puffs into the lungs every 4 (four) hours as needed.        [name] allopurinol (ZYLOPRIM) 300 MG tablet Take 300 mg by mouth daily.        [name] arformoterol (BROVANA) 15 MCG/2ML NEBU Take 15 mcg by nebulization 2 (two) times daily as needed.        [name] aspirin 325 MG tablet Take 325 mg by mouth daily.        [name] azelastine (ASTELIN) 137 MCG/SPRAY nasal spray 2 sprays by Nasal route daily as needed. Use in each nostril as directed       . beclomethasone (QVAR) 80 MCG/ACT inhaler Inhale 2 puffs into the lungs 2 (two) times daily.        . brimonidine (ALPHAGAN P) 0.1 % SOLN Place 1 drop into the left eye 2 (two) times daily.        . cyclobenzaprine (FLEXERIL) 5 MG tablet 1 by mouth at bedtime as needed spasm       . furosemide (LASIX) 20 MG tablet Take 20 mg by mouth daily.        . Glucosource Lancets MISC by Does not apply route. Lancets for glucometer testing.        . insulin glargine (LANTUS) 100 UNIT/ML injection 40units Subcutaneously daily disp qs 1 m in solostar pen this replaces 70-30 mix       . latanoprost (XALATAN) 0.005 % ophthalmic solution Place 1 drop into both eyes at bedtime.        [name] losartan-hydrochlorothiazide (HYZAAR) 100-25 MG per tablet Take 1 tablet by mouth daily.        . Magnesium 80 MG TABS Take 1 tablet (80 mg total) by mouth 1 day or 1 dose.  90 each  3  . metFORMIN (GLUCOPHAGE) 1000 MG tablet Take 1.5 tablets by mouth in the am and 1 tablets in the PM       . metoprolol (TOPROL-XL) 50 MG 24 hr tablet Take 1 tablet (50 mg total) by mouth 2 (two) times daily.  60 tablet  6  . montelukast (SINGULAIR) 10 MG tablet Take 10 mg by mouth at bedtime.        . naproxen (NAPROSYN) 500 MG tablet Take 1 tablet (500 mg total) by mouth 2 (two) times daily with a meal.  60 tablet  0  . NON FORMULARY every 14 (fourteen) days. Allergy vaccine GH 1:10       . NON FORMULARY CPAP 12 advanced       . NON FORMULARY Diabetic shoes. Patient with peripheral neuropathy and loss of sensation in feet, some callouses.       . NON FORMULARY Flexfot CPAP mask 407       . potassium chloride 40 MEQ/15ML (20%) LIQD Take 15 ml ONCE dialy      . sertraline (ZOLOFT) 100 MG tablet Take 150 mg by mouth daily.        . divalproex (DEPAKOTE) 125 MG EC tablet Take 1 tablet (125 mg total) by mouth at bedtime.  30 tablet  5  . nitroGLYCERIN (NITROSTAT) 0.4 MG SL tablet Place 1 tablet (0.4 mg total) under the tongue every 5 (five) minutes as needed for chest pain.  100 tablet  3    Past Medical History  Diagnosis Date  . Chronic cough     Now followed by pulmonary  . Sleep apnea     on CPAP 12  . HTN (hypertension)   . Dyslipidemia   . Diabetes mellitus   . Gout   . Vocal cord paresis   . History of colonoscopy     Past Surgical History  Procedure Date  . Colonoscopy w/ biopsies 11/21/2010    adenoma polyp--no hi grade dysplasia Dr [address]  . Treatment fistula  anal   . Breast reduction surgery 04-21-2003  . Tubal ligation 1987  . Cardiac catheterization 03-02-2004  . Total abdominal hysterectomy 10-07-2000  . Rotator cuff repair 10-18-2003  . Bronchoscopy 07-2007  . Carpal tunnel release 2011  . Knee arthroscopy 08/2000    right  . Shoulder arthroscopy 08/2000    right  . Btl   . Naso-pharyn scope 09/11/06    ENT, normal  . T-score 09/02/04    -0.54 (low risk currently)    Family History  Problem Relation Age of Onset  . Diabetes Mother   . Colon cancer Neg Hx     History   Social History  . Marital Status: Single    Spouse Name: N/A    Number of Children: 1  . Years of Education: N/A   Occupational History  . unemployed    Social History Main Topics  . Smoking status: Former Games developer  . Smokeless tobacco: Not on file  . Alcohol Use: Not on file  . Drug Use: Not on file  . Sexually Active: Not on file   Other Topics Concern  . Not on file   Social History Narrative   Lives alone.Approved for section 8 housing (12/03).Has a son who may be autistic and stays with her at times.    Review of Systems:  All systems reviewed.  They are negative to the above problem except as previously stated.  Vital Signs: BP 136/72  Pulse 67  Resp 16  Ht 5\' 1"  (1.549 m)  Wt 178 lb (80.74 kg)  BMI 33.63 kg/m2  Physical Exam   Patient is in NAD HEENT:  Normocephalic, atraumatic. EOMI, PERRLA.  Neck: JVP is normal. No thyromegaly. No bruits.  Lungs: clear to auscultation. No rales no wheezes.  Heart: Regular rate and rhythm. Normal S1, S2. No S3.   No significant murmurs. PMI not displaced.  Abdomen:  Supple, nontender. Normal bowel sounds. No masses. No hepatomegaly.  Extremities:   Good distal pulses throughout. No lower extremity edema.  Musculoskeletal :  moving all extremities.  Neuro:   alert and oriented x3.  CN II-XII grossly intact.   Assessment and Plan:

## 2011-04-09 NOTE — Assessment & Plan Note (Addendum)
Adequate control on current regimen.

## 2011-04-09 NOTE — Assessment & Plan Note (Addendum)
She was recently taken off Simvistatin.  She was taken off by S. Jennette Kettle   Patient concerned about conserving cost. No CAD on record.  LDL in the past was 151.  Will need to check again.  Needs tighter control with diet.   Will arrange to have these checked this fall.

## 2011-04-19 ENCOUNTER — Encounter: Payer: Self-pay | Admitting: Family Medicine

## 2011-04-19 ENCOUNTER — Ambulatory Visit (INDEPENDENT_AMBULATORY_CARE_PROVIDER_SITE_OTHER): Payer: Medicare Other | Admitting: Family Medicine

## 2011-04-19 VITALS — BP 131/73 | HR 95 | Temp 98.1°F | Wt 177.0 lb

## 2011-04-19 DIAGNOSIS — M549 Dorsalgia, unspecified: Secondary | ICD-10-CM

## 2011-04-19 DIAGNOSIS — M545 Low back pain: Secondary | ICD-10-CM

## 2011-04-19 DIAGNOSIS — M546 Pain in thoracic spine: Secondary | ICD-10-CM

## 2011-04-19 MED ORDER — TRAMADOL HCL 50 MG PO TABS: 50.0000 mg | ORAL_TABLET | Freq: Two times a day (BID) | ORAL | Status: DC | PRN

## 2011-04-19 MED ORDER — POLYETHYLENE GLYCOL 3350 17 G PO PACK: 17.0000 g | PACK | Freq: Two times a day (BID) | ORAL | Status: DC

## 2011-04-19 MED ORDER — DOCUSATE SODIUM 100 MG PO CAPS: 100.0000 mg | ORAL_CAPSULE | Freq: Two times a day (BID) | ORAL | Status: DC | PRN

## 2011-04-19 NOTE — Patient Instructions (Signed)
I am giving you a shot today for your muscle spasm  I think you have arthritis in your left hip.  I want you to take tramadol.  Take 1/2 pill to a full pill as need up to twice a day For you constipation I want you to do Miralax, and colace.  Take each of them twice a day.  Come see Dr. Jennette Kettle on the 29th as well.

## 2011-04-21 NOTE — Assessment & Plan Note (Signed)
1.  Back pain-  Likely MSK in nature and potential to be associated with constipation.  Pt was given a shot today and will start on tramadol will not do narcotics with pt being a fall risk.  Pt to continue to keep other appointments and will follow up with PCP Shot of Toradol given today as well to see if helps. Told to hold naproxen for one day then.  Discussed bowel regimen with pt as well to see if it helps

## 2011-04-21 NOTE — Progress Notes (Signed)
  Subjective:    Patient ID: [name] [name], female    DOB: [birth year], 60 y.o.   MRN: [id number]  Back Pain    1) f/u left back  Pain-Pt has seen her PCP recently for same problem, was referred to urologist awaiting appointment at this it me, pt states though she is tired of the pain she feels on a daily account.  Pt states the pain is on the left side radiates to the front has a history of neuropathy. Pt discussed having an injection before or actually a nerve block but has not had that done.  Pt does walk with a walker and has fallen multiple times.  Pt though states she has not fell since the last time she saw Dr. Jennette Kettle. Pt states the pain is worse with certain movements, pt states hx of a stone but states pain is more dull and constant in nature. Pt has stopped her flexaril because she does not think it makes a difference.    Review of Systems  Musculoskeletal: Positive for back pain.      Pertinent review of systems: negative for fever or unusual weight change. Past medical history, social, surgical and family history all reviewed.   Objective:   Physical Exam  GENERAL: Well-developed, well-nourished, no acute distress.  Legally blind with thick glasses. CARDIOVASCULAR: Regular rate and rhythm no murmur gallop or rub LUNGS: Clear to auscultation bilaterally, no rales or wheeze. ABDOMEN: Soft positive bowel sounds stool palpated in LLQ NEURO: No gross focal neurological deficits. MSK: Movement of extremity x 4. Negative SLT in seated position on left and right.  BACK TTP left thoracolumbar musculature and this reproduces her pain.     Assessment & Plan:  1.  Back pain-  Likely MSK in nature and potential to be associated with constipation.  Pt was given a shot today and will start on tramadol will not do narcotics with pt being a fall risk.  Pt to continue to keep other appointments and will follow up with PCP Shot of Toradol given today as well to see if helps. Told to hold  naproxen for one day then.  Discussed bowel regimen with pt as well to see if it helps

## 2011-04-22 ENCOUNTER — Telehealth: Payer: Self-pay | Admitting: *Deleted

## 2011-04-22 MED ORDER — KETOROLAC TROMETHAMINE 30 MG/ML IJ SOLN
30.0000 mg | Freq: Four times a day (QID) | INTRAMUSCULAR | Status: AC | PRN
  Administered 2011-04-19: 30 mg via INTRAVENOUS

## 2011-04-22 NOTE — Telephone Encounter (Signed)
Called patient and advised that she needs a fasting lipid panel per Dr.Ross when she goes to family practice on 8/29. She states that she only drinks coffee in the am.  Will send staff message to Dr. Denny Levy concerning lab order.

## 2011-04-22 NOTE — Progress Notes (Signed)
Addended by: Jone Baseman D on: 04/22/2011 11:42 AM   Modules accepted: Orders

## 2011-04-23 ENCOUNTER — Ambulatory Visit (INDEPENDENT_AMBULATORY_CARE_PROVIDER_SITE_OTHER): Payer: Medicare Other

## 2011-04-23 ENCOUNTER — Ambulatory Visit
Admission: RE | Admit: 2011-04-23 | Discharge: 2011-04-23 | Disposition: A | Discharge status: Home / Self Care | Payer: Medicare Other | Source: Ambulatory Visit | Attending: Family Medicine | Admitting: Family Medicine

## 2011-04-23 DIAGNOSIS — Z1231 Encounter for screening mammogram for malignant neoplasm of breast: Secondary | ICD-10-CM

## 2011-04-23 DIAGNOSIS — J309 Allergic rhinitis, unspecified: Secondary | ICD-10-CM

## 2011-05-01 ENCOUNTER — Ambulatory Visit (INDEPENDENT_AMBULATORY_CARE_PROVIDER_SITE_OTHER): Payer: Medicare Other | Admitting: Family Medicine

## 2011-05-01 ENCOUNTER — Encounter: Payer: Self-pay | Admitting: Family Medicine

## 2011-05-01 ENCOUNTER — Ambulatory Visit (HOSPITAL_COMMUNITY)
Admission: RE | Admit: 2011-05-01 | Discharge: 2011-05-01 | Disposition: A | Discharge status: Home / Self Care | Payer: Medicare Other | Source: Ambulatory Visit | Attending: Family Medicine | Admitting: Family Medicine

## 2011-05-01 VITALS — BP 158/82 | HR 73 | Temp 98.0°F | Wt 177.0 lb

## 2011-05-01 DIAGNOSIS — M25569 Pain in unspecified knee: Secondary | ICD-10-CM | POA: Insufficient documentation

## 2011-05-01 DIAGNOSIS — M25552 Pain in left hip: Secondary | ICD-10-CM | POA: Insufficient documentation

## 2011-05-01 DIAGNOSIS — M25559 Pain in unspecified hip: Secondary | ICD-10-CM

## 2011-05-01 DIAGNOSIS — M25562 Pain in left knee: Secondary | ICD-10-CM

## 2011-05-01 DIAGNOSIS — M25561 Pain in right knee: Secondary | ICD-10-CM

## 2011-05-01 DIAGNOSIS — M1710 Unilateral primary osteoarthritis, unspecified knee: Secondary | ICD-10-CM | POA: Insufficient documentation

## 2011-05-01 DIAGNOSIS — M87059 Idiopathic aseptic necrosis of unspecified femur: Secondary | ICD-10-CM | POA: Insufficient documentation

## 2011-05-01 DIAGNOSIS — M25551 Pain in right hip: Secondary | ICD-10-CM

## 2011-05-01 DIAGNOSIS — IMO0002 Reserved for concepts with insufficient information to code with codable children: Secondary | ICD-10-CM | POA: Insufficient documentation

## 2011-05-01 DIAGNOSIS — E785 Hyperlipidemia, unspecified: Secondary | ICD-10-CM

## 2011-05-01 DIAGNOSIS — I10 Essential (primary) hypertension: Secondary | ICD-10-CM

## 2011-05-01 LAB — COMPREHENSIVE METABOLIC PANEL
ALT: 34 U/L (ref 0–35)
AST: 33 U/L (ref 0–37)
Albumin: 4.2 g/dL (ref 3.5–5.2)
Alkaline Phosphatase: 91 U/L (ref 39–117)
BUN: 8 mg/dL (ref 6–23)
CO2: 27 mEq/L (ref 19–32)
Calcium: 9.8 mg/dL (ref 8.4–10.5)
Chloride: 106 mEq/L (ref 96–112)
Creat: 0.61 mg/dL (ref 0.50–1.10)
Glucose, Bld: 112 mg/dL — ABNORMAL HIGH (ref 70–99)
Potassium: 3.8 mEq/L (ref 3.5–5.3)
Sodium: 145 mEq/L (ref 135–145)
Total Bilirubin: 0.3 mg/dL (ref 0.3–1.2)
Total Protein: 6.9 g/dL (ref 6.0–8.3)

## 2011-05-01 LAB — LIPID PANEL
Cholesterol: 276 mg/dL — ABNORMAL HIGH (ref 0–200)
HDL: 34 mg/dL — ABNORMAL LOW (ref >39)
LDL Cholesterol: 195 mg/dL — ABNORMAL HIGH (ref 0–99)
Total CHOL/HDL Ratio: 8.1 Ratio
Triglycerides: 233 mg/dL — ABNORMAL HIGH (ref <150)
VLDL: 47 mg/dL — ABNORMAL HIGH (ref 0–40)

## 2011-05-03 ENCOUNTER — Telehealth: Payer: Self-pay | Admitting: Family Medicine

## 2011-05-03 NOTE — Telephone Encounter (Signed)
Needs current labs faxed to [phone].

## 2011-05-06 MED ORDER — MIRTAZAPINE 15 MG PO TABS: 15.0000 mg | ORAL_TABLET | Freq: Every day | ORAL | Status: AC

## 2011-05-06 NOTE — Progress Notes (Signed)
  Subjective:    Patient ID: [name] [name], female    DOB: [birth year], 60 y.o.   MRN: [id number]  HPI  Complaining of pain, all the time. Back bilateral hips and bilateral knees. Says she really wants something done about this as she is just tired of dealing with it. Not sleeping well at night. Appetite seems down. Says she's not depressed she is just tired of being in pain. Wants to have some x-rays done.  Has continued on Zoloft even if she's not sure it's helping, says it's really not helping her sleep. She's had no problems with that.  Review of Systems    denies fever, sweats, chills. No unusual weight gain or loss. She is having hip and knee pain that is increasing in severity over the last 6 months. Objective:   Physical Exam GENERAL: Well-developed female no acute distress HEENT: resting nystagmus left eye. Bilaterally legally blind. Neck is without lymphadenopathy and is supple with full range of motion. LUNGS: Clear to auscultation bilaterally CV: Regular rate and rhythm no murmur gallop or rub HIPS: Decreased internal rotation bilaterally. She has significant pain on left when we internally rotate her pastor about 30. KNEES: Both knees are tender to palpation at the medial joint line. There is no effusion. She has full extension and flexion. Distally she is soft in the calf.       Assessment & Plan:  Chronic pain. Multiple areas and multiple etiologies. I will agree with x-ray also suffered hips and knees as I suspect this is from osteoarthritis. At this point I think helping her sleep at night and treating some chronic depression would be the best thing we can do for her in addition to getting some imaging studies so we'll start mirtazapine. I will see her back in one month. We spent greater than 50% of our 40 minute office visit in counseling and education regarding pain management and her multiple medical problems. We also went to her med list and cleaned that up.

## 2011-05-07 ENCOUNTER — Telehealth: Payer: Self-pay | Admitting: Family Medicine

## 2011-05-07 NOTE — Telephone Encounter (Signed)
Dr. Trilby Drummer is needing copies of most recent labs for [name] [name] faxed to [phone].

## 2011-05-07 NOTE — Telephone Encounter (Signed)
Information faxed to [phone].Laureen Ochs, Viann Shove

## 2011-05-08 ENCOUNTER — Telehealth: Payer: Self-pay | Admitting: Family Medicine

## 2011-05-08 NOTE — Telephone Encounter (Signed)
[name] [name] is calling for her Xray results.

## 2011-05-10 ENCOUNTER — Encounter: Payer: Self-pay | Admitting: Family Medicine

## 2011-05-10 NOTE — Telephone Encounter (Signed)
Called and lvm to inform pt of x-ray results. (see previous notes for further information).[name] [name]

## 2011-05-10 NOTE — Telephone Encounter (Signed)
She has very bad arthritis in her hips and pretty bad arthritis inher knees. Her options for the knees are steroid shots every three months and pain meds--ultimately knee replacements Her hip options--pain meds until she wants to pursue hip replacement If she wants something strionger for pain, we can do thatt. Let me know THANKS! Denny Levy

## 2011-05-13 NOTE — Telephone Encounter (Signed)
Spoke with patient and informed of below. She has an appointment on 9/26 and will discuss further then

## 2011-05-17 ENCOUNTER — Ambulatory Visit (INDEPENDENT_AMBULATORY_CARE_PROVIDER_SITE_OTHER): Payer: Medicare Other

## 2011-05-17 DIAGNOSIS — J309 Allergic rhinitis, unspecified: Secondary | ICD-10-CM

## 2011-05-17 DIAGNOSIS — Z23 Encounter for immunization: Secondary | ICD-10-CM

## 2011-05-21 ENCOUNTER — Ambulatory Visit (INDEPENDENT_AMBULATORY_CARE_PROVIDER_SITE_OTHER): Payer: Medicare Other

## 2011-05-21 DIAGNOSIS — J309 Allergic rhinitis, unspecified: Secondary | ICD-10-CM

## 2011-05-29 ENCOUNTER — Encounter: Payer: Self-pay | Admitting: Family Medicine

## 2011-05-29 ENCOUNTER — Ambulatory Visit (INDEPENDENT_AMBULATORY_CARE_PROVIDER_SITE_OTHER): Payer: Medicare Other | Admitting: Family Medicine

## 2011-05-29 VITALS — BP 144/75 | HR 90 | Temp 98.4°F | Ht 61.5 in | Wt 178.0 lb

## 2011-05-29 DIAGNOSIS — F339 Major depressive disorder, recurrent, unspecified: Secondary | ICD-10-CM

## 2011-05-29 DIAGNOSIS — M1710 Unilateral primary osteoarthritis, unspecified knee: Secondary | ICD-10-CM

## 2011-05-29 DIAGNOSIS — IMO0002 Reserved for concepts with insufficient information to code with codable children: Secondary | ICD-10-CM

## 2011-05-29 DIAGNOSIS — M1610 Unilateral primary osteoarthritis, unspecified hip: Secondary | ICD-10-CM | POA: Insufficient documentation

## 2011-05-29 DIAGNOSIS — G609 Hereditary and idiopathic neuropathy, unspecified: Secondary | ICD-10-CM

## 2011-05-29 NOTE — Progress Notes (Signed)
  Subjective:    Patient ID: [name] [name], female    DOB: 1951-05-29, 60 y.o.   MRN: [id number]  HPI  1. F/u hip and knee pain--to discuss x ray findings,. Chronic pain--very stiff, falls a lot. Pain meds help some but she does not like to take them  2. F/u insomnia / anxiety---remeron did help her sleep. 3. Peripheral neuropathy---remeron did not seem to help that. 4. Toenails need cutting.  Review of Systems    Pertinent review of systems: negative for fever or unusual weight change.  Objective:   Physical Exam  GENERALl: Well developed, well nourished, in no acute distress. NECK: Supple, FROM, without lymphadenopathy.  THYROID: normal without nodularity CAROTID ARTERIES: without bruits LUNGS: clear to auscultation bilaterally. No wheezes or rales. HEART: Regular rate and rhythm, no murmurs ABDOMEN: soft with positive bowel sounds NEURO: decreased sensation to soft touch B hands to mid forearm and feet to ankle.   XRAY review  Severe OA B hips, B knees     Assessment & Plan:  1. Insomnia , depression, and peripheral neuropathy---remeron seems to be helping the insomnia. 2. Toenails trimmed 3. Set up ortho eval potential future arthroplasty

## 2011-05-31 ENCOUNTER — Telehealth: Payer: Self-pay | Admitting: Family Medicine

## 2011-05-31 ENCOUNTER — Telehealth: Payer: Self-pay | Admitting: *Deleted

## 2011-05-31 NOTE — Telephone Encounter (Signed)
Spoke with [name] and told her that I was unable to make an appt with Dr. Turner Daniels. I was told by the person that was attempting to make the appt for her that [name] will need to call their office at [phone] and speak to someone there. [name] [name] agreed and understood.[name]

## 2011-05-31 NOTE — Telephone Encounter (Signed)
Ms. Cortinas is calling back to speak with Tonya.

## 2011-05-31 NOTE — Telephone Encounter (Signed)
Pt called and informed me that she is unable to go to [facility] to see Dr. Turner Daniels due to an outstanding bill that she has incurred with [facility] ortho. She cannot afford to pay them at this time. She asked if there was anything else that can be done and I asked her if she could do any kind of payment arrangements and see if they would be willing to take her that way she stated no. I will forward this information to Dr. Johna Roles, Viann Shove

## 2011-06-04 ENCOUNTER — Other Ambulatory Visit: Payer: Self-pay | Admitting: Family Medicine

## 2011-06-04 NOTE — Telephone Encounter (Signed)
Refill request

## 2011-06-06 NOTE — Telephone Encounter (Signed)
Dear Cliffton Asters Team Please call in the tramadol as per the rx [facility]! ,me

## 2011-06-06 NOTE — Telephone Encounter (Signed)
Called in RX 

## 2011-06-11 LAB — AFB CULTURE WITH SMEAR (NOT AT ARMC): Acid Fast Smear: NONE SEEN

## 2011-06-11 LAB — FUNGUS CULTURE W SMEAR: Fungal Smear: NONE SEEN

## 2011-06-11 LAB — CULTURE, RESPIRATORY W GRAM STAIN: Gram Stain: NONE SEEN

## 2011-06-11 LAB — CULTURE, RESPIRATORY

## 2011-06-11 NOTE — Progress Notes (Signed)
With her DM, severe peripheral neuropathy and foot deformity with callous she needs continued use of diabetic shoes and inserts (orthotics(

## 2011-06-11 NOTE — Progress Notes (Signed)
DETAILED FOOT EXAM:  Pes planus B. Medial foot collapse  -moderate.  SKI(N: Increased callous laterally both feet, no fissuring noted. ARTERIAL: cap refill delayed--> 5 sec. NEURO: insensate to soft touch using monofilament on plantar and dorsal surface of feet bilaterally up to level of area right above malleoli on ankle. NAILS: deformed, thickened and curved. No onychomycosis noted.  Procedure note: All ten toe nails were trimmed.

## 2011-06-12 ENCOUNTER — Other Ambulatory Visit: Payer: Self-pay | Admitting: Family Medicine

## 2011-06-12 NOTE — Telephone Encounter (Signed)
Refill request

## 2011-06-13 ENCOUNTER — Ambulatory Visit (INDEPENDENT_AMBULATORY_CARE_PROVIDER_SITE_OTHER): Payer: Medicare Other

## 2011-06-13 DIAGNOSIS — J309 Allergic rhinitis, unspecified: Secondary | ICD-10-CM

## 2011-06-18 NOTE — Telephone Encounter (Signed)
Refill request

## 2011-07-03 ENCOUNTER — Encounter: Payer: Self-pay | Admitting: Family Medicine

## 2011-07-03 ENCOUNTER — Ambulatory Visit (INDEPENDENT_AMBULATORY_CARE_PROVIDER_SITE_OTHER): Payer: Medicare Other | Admitting: Family Medicine

## 2011-07-03 ENCOUNTER — Ambulatory Visit (INDEPENDENT_AMBULATORY_CARE_PROVIDER_SITE_OTHER): Payer: Medicare Other

## 2011-07-03 VITALS — BP 142/72 | HR 63 | Ht 61.5 in | Wt 177.0 lb

## 2011-07-03 DIAGNOSIS — I10 Essential (primary) hypertension: Secondary | ICD-10-CM

## 2011-07-03 DIAGNOSIS — J309 Allergic rhinitis, unspecified: Secondary | ICD-10-CM

## 2011-07-03 DIAGNOSIS — E119 Type 2 diabetes mellitus without complications: Secondary | ICD-10-CM

## 2011-07-03 DIAGNOSIS — IMO0002 Reserved for concepts with insufficient information to code with codable children: Secondary | ICD-10-CM

## 2011-07-03 DIAGNOSIS — M1710 Unilateral primary osteoarthritis, unspecified knee: Secondary | ICD-10-CM

## 2011-07-03 DIAGNOSIS — G609 Hereditary and idiopathic neuropathy, unspecified: Secondary | ICD-10-CM

## 2011-07-03 LAB — COMPREHENSIVE METABOLIC PANEL
ALT: 27 U/L (ref 0–35)
AST: 31 U/L (ref 0–37)
Albumin: 4.4 g/dL (ref 3.5–5.2)
Alkaline Phosphatase: 99 U/L (ref 39–117)
BUN: 8 mg/dL (ref 6–23)
CO2: 28 mEq/L (ref 19–32)
Calcium: 9.8 mg/dL (ref 8.4–10.5)
Chloride: 103 mEq/L (ref 96–112)
Creat: 0.64 mg/dL (ref 0.50–1.10)
Glucose, Bld: 112 mg/dL — ABNORMAL HIGH (ref 70–99)
Potassium: 4.1 mEq/L (ref 3.5–5.3)
Sodium: 141 mEq/L (ref 135–145)
Total Bilirubin: 0.4 mg/dL (ref 0.3–1.2)
Total Protein: 7.3 g/dL (ref 6.0–8.3)

## 2011-07-03 LAB — LIPID PANEL
Cholesterol: 207 mg/dL — ABNORMAL HIGH (ref 0–200)
HDL: 36 mg/dL — ABNORMAL LOW (ref >39)
LDL Cholesterol: 138 mg/dL — ABNORMAL HIGH (ref 0–99)
Total CHOL/HDL Ratio: 5.8 Ratio
Triglycerides: 165 mg/dL — ABNORMAL HIGH (ref <150)
VLDL: 33 mg/dL (ref 0–40)

## 2011-07-03 LAB — POCT GLYCOSYLATED HEMOGLOBIN (HGB A1C): Hemoglobin A1C: 7.1

## 2011-07-03 NOTE — Patient Instructions (Signed)
rtc 3 m

## 2011-07-03 NOTE — Progress Notes (Signed)
  Subjective:    Patient ID: [name] [name], female    DOB: 1951/01/30, 60 y.o.   MRN: [id number]  HPI Followup arthritis pain in her hips knees and shoulders. She was unable to see the orthopedist secondary to some financial concerns. She has been using the tramadol regularly. Says it really helps at night she can't sleep but the pain relief during the day is marginal. She does not want to try anything stronger such as narcotics.  #2. Finger stiffness and pain when she is out in the very cold weather. Wonders if there is some special type of cough that would help her with her severe peripheral neuropathy.  #3 constipation. She is still not gotten better she is having regular bowel movements. She has used some intermittent laxatives which give her diarrhea. She is still not quite taking the MiraLax on a daily basis. She has questions.   Review of Systems Denies fever, sweats, chills. Denies unusual weight change. No change in her bowel movements. She is positive for constipation but this is essentially unchanged over the last 2 years.    Objective:   Physical Exam Vital signs reviewed. GENERAL: Well-developed, well-nourished, no acute distress. CARDIOVASCULAR: Regular rate and rhythm no murmur gallop or rub LUNGS: Clear to auscultation bilaterally, no rales or wheeze. ABDOMEN: Soft positive bowel sounds NEURO: Severe decrease in soft touch sensation in bilateral hands and feet. Bilateral legal blindness with resting nystagmus. MSK: Movement of upper and lower extremity x 4. Decreased grip strength bilateral hands particularly in the left. She has very little fine motor skills in her left hand and only moderate fine motor use of her right hand.         Assessment & Plan:  #1. Arthritis in her hips and knees. The hip x-rays were actually read as avascular necrosis. We reviewed the x-rays of those today and treatment options which are limited to pain medications at this time. When she gets  her bone pain I would like her to see Dr. Trish Fountain at [facility] orthoped[facility]. I do not think she is ready for a total hip replacement at this time. I agree with stopping her on narcotics, I would like to postpone that as long as possible. #2. Peripheral neuropathy and loss of fine motor function of her hands. I will check on the availability of some type of protective splint for cold weather #3 pre-constipation. We again discussed how to use MiraLax. #4. Chronic medical issues including diabetes mellitus and hypertension and hyperlipidemia. We'll check some blood work today and get back with her on that. No medication changes at this time. She has had her flu shot.

## 2011-07-04 ENCOUNTER — Encounter: Payer: Self-pay | Admitting: Family Medicine

## 2011-07-19 ENCOUNTER — Ambulatory Visit (INDEPENDENT_AMBULATORY_CARE_PROVIDER_SITE_OTHER): Payer: Medicare Other

## 2011-07-19 DIAGNOSIS — J309 Allergic rhinitis, unspecified: Secondary | ICD-10-CM

## 2011-07-31 ENCOUNTER — Ambulatory Visit (INDEPENDENT_AMBULATORY_CARE_PROVIDER_SITE_OTHER): Payer: Medicare Other

## 2011-07-31 ENCOUNTER — Ambulatory Visit (INDEPENDENT_AMBULATORY_CARE_PROVIDER_SITE_OTHER): Payer: Medicare Other | Admitting: Internal Medicine

## 2011-07-31 ENCOUNTER — Encounter: Payer: Self-pay | Admitting: Internal Medicine

## 2011-07-31 VITALS — BP 114/62 | HR 62 | Ht 61.0 in | Wt 181.4 lb

## 2011-07-31 DIAGNOSIS — J45909 Unspecified asthma, uncomplicated: Secondary | ICD-10-CM

## 2011-07-31 DIAGNOSIS — J301 Allergic rhinitis due to pollen: Secondary | ICD-10-CM

## 2011-07-31 DIAGNOSIS — J3801 Paralysis of vocal cords and larynx, unilateral: Secondary | ICD-10-CM

## 2011-07-31 DIAGNOSIS — G4733 Obstructive sleep apnea (adult) (pediatric): Secondary | ICD-10-CM

## 2011-07-31 DIAGNOSIS — J309 Allergic rhinitis, unspecified: Secondary | ICD-10-CM

## 2011-07-31 MED ORDER — ALBUTEROL SULFATE (2.5 MG/3ML) 0.083% IN NEBU: 2.5000 mg | INHALATION_SOLUTION | Freq: Four times a day (QID) | RESPIRATORY_TRACT | Status: DC | PRN

## 2011-07-31 NOTE — Patient Instructions (Signed)
Order- [facility]- Advanced- replacement CPAP mask and supplies-   Dx OSA  [facility] for dx asthma  Script for DME- printed

## 2011-07-31 NOTE — Progress Notes (Signed)
Patient ID: [name] [name], female    DOB: 25-Oct-1950, 60 y.o.   MRN: [id number]  HPI 01/24/11 35 yoF former smoker followed for asthma, sleep apnea, complicated by hx depression, vocal cord paralysis, GERD. Last here August 17, 2010- note reviewed  Doesn't wear CPAP- can't tolerate it on her face. Didn't like nasal pillows or her current nasal mask. Has own teeth. Aware she tosses and turns at night. CPAP 12 Advanced.  Denies recent asthma attacks. Uses rescue inhaler 1-2x/ month. Continues allergy shots and says she breathes better and coughs less since she has been on them.  No longer chokes or coughs much with eating as she used to. No recent colds.   07/31/11- 78 yoF former smoker followed for asthma, sleep apnea, complicated by hx depression, vocal cord paralysis, GERD. Has had flu vaccine. Continues CPAP at 12 CWP,  used all night every night. She had moved, and can't find her nebulizer machine. We discussed whether she would use it this winter. She continues allergy vaccine and believes that has helped.  Review of Systems Constitutional:   No-   weight loss, night sweats, fevers, chills, fatigue,+ lassitude. HEENT:   No-  headaches, difficulty swallowing, tooth/dental problems, sore throat,       No-  sneezing, itching, ear ache, nasal congestion, post nasal drip,  CV:  No-   chest pain, orthopnea, PND, swelling in lower extremities, anasarca,                                  dizziness, palpitations Resp: No-   shortness of breath with exertion or at rest.              No-   productive cough,  No non-productive cough,  No- coughing up of blood.              No-   change in color of mucus.  Occasional- wheezing.   Skin: No-   rash or lesions. GI:  No-   heartburn, indigestion, abdominal pain, nausea, vomiting, diarrhea,                 change in bowel habits, loss of appetite GU: No-   dysuria, change in color of urine, no urgency or frequency.  No- flank pain. MS:  Carpal tunnel  pains.  No- decreased range of motion.  No- back pain. Neuro-     nothing unusual Psych:  No- change in mood or affect. No depression or anxiety.  No memory loss.     Objective:   Physical Exam General- Alert, Oriented, Affect-appropriate, Distress- none acute Skin- rash-none, lesions- none, excoriation- none Lymphadenopathy- none Head- atraumatic            Eyes- Very thick corrective lenses, PERRLA, conjunctivae clear secretions            Ears- Hearing, canals-normal            Nose- Clear, no-Septal dev, mucus, polyps, erosion, perforation             Throat- Mallampati II , mucosa clear , drainage- none, tonsils- atrophic Neck- flexible , trachea midline, no stridor , thyroid nl, carotid no bruit Chest - symmetrical excursion , unlabored           Heart/CV- RRR , no murmur , no gallop  , no rub, nl s1 s2                           -  JVD- none , edema- none, stasis changes- none, varices- none           Lung- clear to P&A, wheeze- none, cough- none , dullness-none, rub- none           Chest wall-  Abd- tender-no, distended-no, bowel sounds-present, HSM- no Br/ Gen/ Rectal- Not done, not indicated Extrem- cyanosis- none, clubbing, none, atrophy- none, Brace on left forearm/ wrist Neuro- grossly intact to observation

## 2011-08-01 NOTE — Assessment & Plan Note (Signed)
She continues allergy vaccine and believes that has helped. We discussed goals and risks again.

## 2011-08-01 NOTE — Assessment & Plan Note (Signed)
Voice quality seems good without stridor.

## 2011-08-01 NOTE — Assessment & Plan Note (Signed)
Good compliance and control 

## 2011-08-02 ENCOUNTER — Telehealth: Payer: Self-pay | Admitting: *Deleted

## 2011-08-02 NOTE — Telephone Encounter (Signed)
Called patient concerning last Lipid panel. She was not interested in a dietary consult. Will have Lipids checked again next summer with Dr.Neal.

## 2011-08-16 ENCOUNTER — Ambulatory Visit (INDEPENDENT_AMBULATORY_CARE_PROVIDER_SITE_OTHER): Payer: Medicare Other

## 2011-08-16 DIAGNOSIS — J309 Allergic rhinitis, unspecified: Secondary | ICD-10-CM

## 2011-09-10 ENCOUNTER — Ambulatory Visit (INDEPENDENT_AMBULATORY_CARE_PROVIDER_SITE_OTHER): Payer: Medicare Other

## 2011-09-10 DIAGNOSIS — J309 Allergic rhinitis, unspecified: Secondary | ICD-10-CM

## 2011-09-17 ENCOUNTER — Encounter: Payer: Self-pay | Admitting: Internal Medicine

## 2011-09-27 ENCOUNTER — Other Ambulatory Visit: Payer: Self-pay | Admitting: Family Medicine

## 2011-09-27 MED ORDER — PREGABALIN 100 MG PO CAPS: ORAL_CAPSULE | ORAL | Status: DC

## 2011-09-27 NOTE — Progress Notes (Signed)
Faxed to [facility] Denny Levy

## 2011-10-02 ENCOUNTER — Ambulatory Visit (INDEPENDENT_AMBULATORY_CARE_PROVIDER_SITE_OTHER): Payer: Medicare Other | Admitting: Family Medicine

## 2011-10-02 ENCOUNTER — Encounter: Payer: Self-pay | Admitting: Family Medicine

## 2011-10-02 VITALS — BP 152/78 | HR 61 | Temp 98.0°F | Ht 62.6 in | Wt 179.0 lb

## 2011-10-02 DIAGNOSIS — E119 Type 2 diabetes mellitus without complications: Secondary | ICD-10-CM

## 2011-10-02 DIAGNOSIS — E785 Hyperlipidemia, unspecified: Secondary | ICD-10-CM

## 2011-10-02 DIAGNOSIS — M549 Dorsalgia, unspecified: Secondary | ICD-10-CM

## 2011-10-02 DIAGNOSIS — M545 Low back pain: Secondary | ICD-10-CM

## 2011-10-02 DIAGNOSIS — M546 Pain in thoracic spine: Secondary | ICD-10-CM

## 2011-10-02 LAB — POCT GLYCOSYLATED HEMOGLOBIN (HGB A1C): Hemoglobin A1C: 6.9

## 2011-10-02 LAB — LDL CHOLESTEROL, DIRECT: Direct LDL: 134 mg/dL — ABNORMAL HIGH

## 2011-10-02 NOTE — Progress Notes (Signed)
  Subjective:    Patient ID: [name] [name], female    DOB: 01/05/1951, 61 y.o.   MRN: [id number]  HPI  #1. Continued problems with peripheral neuropathy. Issues in her hands are particularly bothersome during the winter. Has not been able to find a glove that allows her to keep her hands warm in the same time use her cane or walker because she has no tactile sense with a glove own. #2. Chronic worsening mid back pain. We'll set up we can do something different for her. It is achy mostly in the left mid back is worse at night and after she walks for any distance. No weakness in her legs. She does have numbness in her feet it seems to be increasing. #3. Needs her toenails cut. Cannot reach them herself #4. We had put her back on her simvastatin. When I did her last lab work she had only been back on it about a week.  Review of Systems    denies fever, sweats, chills. Please see history of present illness above for complete addition of review of systems. Objective:   Physical Exam  Vital signs reviewed. GENERAL: Well-developed, well-nourished, no acute distress. CARDIOVASCULAR: Regular rate and rhythm no murmur gallop or rub LUNGS: Clear to auscultation bilaterally, no rales or wheeze. ABDOMEN: Soft positive bowel sounds NEURO: Bilateral equally blind. Resting nystagmus. Significant decreased to soft touch sensation up to her lower calf on each leg and bilateral hands particularly on the palmar surfaces. The left hand has limited grip strength. MSK: Movement of extremity x 4. Back is mildly tender to palpation in the muscles around the thoracic spine particularly on the left. There is no defect, no spasm. FEET: Bilaterally there is some dry skin but no thickened calloused areas, no fissures. Her nails are long and curved. PROCEDURE note: I trimmed all of her toenails.        Assessment & Plan:  #1. Peripheral neuropathy continuing to worsen. I will investigate any type of glove that  would be useful for her I would also investigate something she heard about which is the roll-on gabapentin. #2. Chronic back pain. We considers sending her to PMR for evaluation for possible injections. She would like me to set that up #3. Hyperlipidemia. We'll check direct LDL today #4. Chronic issues including diabetes mellitus, foot care. Today we trimmed her nails. She is due and will A1c in 4 weeks. I will see her back in 4-6 weeks.

## 2011-10-04 ENCOUNTER — Encounter: Payer: Self-pay | Admitting: Family Medicine

## 2011-10-04 ENCOUNTER — Ambulatory Visit (INDEPENDENT_AMBULATORY_CARE_PROVIDER_SITE_OTHER): Payer: Medicare Other

## 2011-10-04 DIAGNOSIS — J309 Allergic rhinitis, unspecified: Secondary | ICD-10-CM

## 2011-10-11 ENCOUNTER — Other Ambulatory Visit: Payer: Self-pay | Admitting: Family Medicine

## 2011-10-11 NOTE — Progress Notes (Signed)
[name] [name] / [name] [name] See if we can set her up with either Dr Wynn Banker or Dr Renee Harder (sp?) for evaluation for spine injection.  Dx OA back

## 2011-10-14 NOTE — Progress Notes (Signed)
Scheduled pt for evaluation for ESI with Dr. Chong Sicilian at [facility] for 10/15/11 @ 3pm.  Pt notified of appt info.

## 2011-10-15 ENCOUNTER — Encounter: Payer: Self-pay | Admitting: Family Medicine

## 2011-10-24 ENCOUNTER — Ambulatory Visit (INDEPENDENT_AMBULATORY_CARE_PROVIDER_SITE_OTHER): Payer: Medicare Other

## 2011-10-24 DIAGNOSIS — J309 Allergic rhinitis, unspecified: Secondary | ICD-10-CM

## 2011-11-13 ENCOUNTER — Ambulatory Visit: Payer: Medicare Other | Admitting: Family Medicine

## 2011-11-14 ENCOUNTER — Ambulatory Visit (INDEPENDENT_AMBULATORY_CARE_PROVIDER_SITE_OTHER): Payer: Medicare Other | Admitting: Family Medicine

## 2011-11-14 VITALS — BP 132/78 | HR 68 | Temp 98.7°F | Ht 61.0 in | Wt 181.0 lb

## 2011-11-14 DIAGNOSIS — L603 Nail dystrophy: Secondary | ICD-10-CM

## 2011-11-14 DIAGNOSIS — G609 Hereditary and idiopathic neuropathy, unspecified: Secondary | ICD-10-CM

## 2011-11-14 DIAGNOSIS — L608 Other nail disorders: Secondary | ICD-10-CM

## 2011-11-14 DIAGNOSIS — E119 Type 2 diabetes mellitus without complications: Secondary | ICD-10-CM

## 2011-11-15 ENCOUNTER — Ambulatory Visit (INDEPENDENT_AMBULATORY_CARE_PROVIDER_SITE_OTHER): Payer: Medicare Other

## 2011-11-15 DIAGNOSIS — L603 Nail dystrophy: Secondary | ICD-10-CM | POA: Insufficient documentation

## 2011-11-15 DIAGNOSIS — J309 Allergic rhinitis, unspecified: Secondary | ICD-10-CM

## 2011-11-15 NOTE — Progress Notes (Signed)
  Subjective:    Patient ID: [name] [name], female    DOB: October 27, 1950, 61 y.o.   MRN: [id number]  HPI Followup of severe peripheral neuropathy. Not doing much better. She did see the orthopedist who is trying her on some topical cream. She thinks it may be beneficial. He has ordered an MRI which is pending. Unable to clip her toenails and is unable to have podiatry covered by her current insurance. This is the problem is she has significantly thickened dystrophic curved nails. Positive we could trim them for her.  Review of Systems Denies fever, sweats, chills. Denies unusual weight gain or loss.      Objective:   Physical Exam  Vital signs reviewed. GENERAL: Well developed, well nourished, no acute distress EXTREMITY: Decreased sensation to soft touch bilateral hands and wrists up to about mid forearm on the right and about wrist level on the left. Similar neuropathy in a stocking distribution bilaterally. NAILS: Dystrophic with some curvature and thickening. These were trimmed      Assessment & Plan:  #1. Peripheral neuropathy. We'll try to obtain the same topical cream the orthopedist but her cry. #2. Dystrophic nails trimmed and debrided. Followup 4-6 weeks.

## 2011-11-18 ENCOUNTER — Inpatient Hospital Stay (HOSPITAL_COMMUNITY): Payer: Medicare Other

## 2011-11-18 ENCOUNTER — Ambulatory Visit (INDEPENDENT_AMBULATORY_CARE_PROVIDER_SITE_OTHER): Payer: Medicare Other | Admitting: Family Medicine

## 2011-11-18 ENCOUNTER — Observation Stay (HOSPITAL_COMMUNITY)
Admission: AD | Admit: 2011-11-18 | Discharge: 2011-11-21 | Disposition: A | Discharge status: Skilled Nursing Facility | Payer: Medicare Other | Source: Ambulatory Visit | Attending: Family Medicine | Admitting: Family Medicine

## 2011-11-18 ENCOUNTER — Encounter (HOSPITAL_COMMUNITY): Payer: Self-pay | Admitting: Radiology

## 2011-11-18 VITALS — BP 144/83

## 2011-11-18 DIAGNOSIS — F0781 Postconcussional syndrome: Secondary | ICD-10-CM

## 2011-11-18 DIAGNOSIS — IMO0002 Reserved for concepts with insufficient information to code with codable children: Secondary | ICD-10-CM

## 2011-11-18 DIAGNOSIS — S0180XA Unspecified open wound of other part of head, initial encounter: Secondary | ICD-10-CM | POA: Insufficient documentation

## 2011-11-18 DIAGNOSIS — W010XXA Fall on same level from slipping, tripping and stumbling without subsequent striking against object, initial encounter: Secondary | ICD-10-CM | POA: Insufficient documentation

## 2011-11-18 DIAGNOSIS — T148XXA Other injury of unspecified body region, initial encounter: Secondary | ICD-10-CM

## 2011-11-18 DIAGNOSIS — G619 Inflammatory polyneuropathy, unspecified: Secondary | ICD-10-CM

## 2011-11-18 DIAGNOSIS — H409 Unspecified glaucoma: Secondary | ICD-10-CM | POA: Insufficient documentation

## 2011-11-18 DIAGNOSIS — H548 Legal blindness, as defined in USA: Secondary | ICD-10-CM

## 2011-11-18 DIAGNOSIS — H269 Unspecified cataract: Secondary | ICD-10-CM | POA: Insufficient documentation

## 2011-11-18 DIAGNOSIS — B977 Papillomavirus as the cause of diseases classified elsewhere: Secondary | ICD-10-CM | POA: Insufficient documentation

## 2011-11-18 DIAGNOSIS — F329 Major depressive disorder, single episode, unspecified: Secondary | ICD-10-CM | POA: Insufficient documentation

## 2011-11-18 DIAGNOSIS — G4733 Obstructive sleep apnea (adult) (pediatric): Secondary | ICD-10-CM | POA: Insufficient documentation

## 2011-11-18 DIAGNOSIS — X58XXXA Exposure to other specified factors, initial encounter: Secondary | ICD-10-CM

## 2011-11-18 DIAGNOSIS — F3289 Other specified depressive episodes: Secondary | ICD-10-CM | POA: Insufficient documentation

## 2011-11-18 DIAGNOSIS — S060X0A Concussion without loss of consciousness, initial encounter: Secondary | ICD-10-CM

## 2011-11-18 DIAGNOSIS — I10 Essential (primary) hypertension: Secondary | ICD-10-CM | POA: Insufficient documentation

## 2011-11-18 DIAGNOSIS — R51 Headache: Secondary | ICD-10-CM

## 2011-11-18 DIAGNOSIS — Z9181 History of falling: Secondary | ICD-10-CM

## 2011-11-18 DIAGNOSIS — S060X9A Concussion with loss of consciousness of unspecified duration, initial encounter: Secondary | ICD-10-CM

## 2011-11-18 DIAGNOSIS — G622 Polyneuropathy due to other toxic agents: Secondary | ICD-10-CM

## 2011-11-18 DIAGNOSIS — M25539 Pain in unspecified wrist: Secondary | ICD-10-CM | POA: Insufficient documentation

## 2011-11-18 DIAGNOSIS — M109 Gout, unspecified: Secondary | ICD-10-CM | POA: Insufficient documentation

## 2011-11-18 DIAGNOSIS — R42 Dizziness and giddiness: Secondary | ICD-10-CM | POA: Insufficient documentation

## 2011-11-18 DIAGNOSIS — E119 Type 2 diabetes mellitus without complications: Secondary | ICD-10-CM | POA: Insufficient documentation

## 2011-11-18 DIAGNOSIS — E11622 Type 2 diabetes mellitus with other skin ulcer: Secondary | ICD-10-CM

## 2011-11-18 DIAGNOSIS — R269 Unspecified abnormalities of gait and mobility: Secondary | ICD-10-CM | POA: Insufficient documentation

## 2011-11-18 DIAGNOSIS — Y9210 Unspecified residential institution as the place of occurrence of the external cause: Secondary | ICD-10-CM | POA: Insufficient documentation

## 2011-11-18 DIAGNOSIS — G609 Hereditary and idiopathic neuropathy, unspecified: Secondary | ICD-10-CM | POA: Insufficient documentation

## 2011-11-18 DIAGNOSIS — E785 Hyperlipidemia, unspecified: Secondary | ICD-10-CM | POA: Insufficient documentation

## 2011-11-18 HISTORY — DX: Low back pain: M54.5

## 2011-11-18 HISTORY — DX: Depression, unspecified: F32.A

## 2011-11-18 HISTORY — DX: Headache: R51

## 2011-11-18 HISTORY — DX: Reserved for inherently not codable concepts without codable children: IMO0001

## 2011-11-18 HISTORY — DX: Pneumonia, unspecified organism: J18.9

## 2011-11-18 HISTORY — DX: Gastro-esophageal reflux disease without esophagitis: K21.9

## 2011-11-18 HISTORY — DX: Adverse effect of unspecified anesthetic, initial encounter: T41.45XA

## 2011-11-18 HISTORY — DX: Major depressive disorder, single episode, unspecified: F32.9

## 2011-11-18 HISTORY — DX: Chronic obstructive pulmonary disease, unspecified: J44.9

## 2011-11-18 HISTORY — DX: Other chronic pain: G89.29

## 2011-11-18 HISTORY — DX: Paralysis of vocal cords and larynx, unilateral: J38.01

## 2011-11-18 HISTORY — DX: Low back pain, unspecified: M54.50

## 2011-11-18 HISTORY — DX: Other complications of anesthesia, initial encounter: T88.59XA

## 2011-11-18 HISTORY — DX: Type 2 diabetes mellitus without complications: E11.9

## 2011-11-18 HISTORY — DX: Encounter for other specified aftercare: Z51.89

## 2011-11-18 HISTORY — DX: Unspecified osteoarthritis, unspecified site: M19.90

## 2011-11-18 HISTORY — DX: Cardiac murmur, unspecified: R01.1

## 2011-11-18 LAB — COMPREHENSIVE METABOLIC PANEL
ALT: 21 U/L (ref 0–35)
AST: 21 U/L (ref 0–37)
Albumin: 3.6 g/dL (ref 3.5–5.2)
Alkaline Phosphatase: 123 U/L — ABNORMAL HIGH (ref 39–117)
BUN: 12 mg/dL (ref 6–23)
CO2: 30 mEq/L (ref 19–32)
Calcium: 9.7 mg/dL (ref 8.4–10.5)
Chloride: 100 mEq/L (ref 96–112)
Creatinine, Ser: 0.56 mg/dL (ref 0.50–1.10)
GFR calc Af Amer: >90 mL/min (ref >90)
GFR calc non Af Amer: >90 mL/min (ref >90)
Glucose, Bld: 155 mg/dL — ABNORMAL HIGH (ref 70–99)
Potassium: 3.1 mEq/L — ABNORMAL LOW (ref 3.5–5.1)
Sodium: 142 mEq/L (ref 135–145)
Total Bilirubin: 0.3 mg/dL (ref 0.3–1.2)
Total Protein: 7.3 g/dL (ref 6.0–8.3)

## 2011-11-18 LAB — CBC
HCT: 36 % (ref 36.0–46.0)
Hemoglobin: 11.4 g/dL — ABNORMAL LOW (ref 12.0–15.0)
MCH: 27 pg (ref 26.0–34.0)
MCHC: 31.7 g/dL (ref 30.0–36.0)
MCV: 85.3 fL (ref 78.0–100.0)
Platelets: 221 10*3/uL (ref 150–400)
RBC: 4.22 MIL/uL (ref 3.87–5.11)
RDW: 14.4 % (ref 11.5–15.5)
WBC: 12.7 10*3/uL — ABNORMAL HIGH (ref 4.0–10.5)

## 2011-11-18 LAB — GLUCOSE, CAPILLARY
Glucose-Capillary: 162 mg/dL — ABNORMAL HIGH (ref 70–99)
Glucose-Capillary: 198 mg/dL — ABNORMAL HIGH (ref 70–99)

## 2011-11-18 MED ORDER — POTASSIUM CHLORIDE CRYS ER 20 MEQ PO TBCR
40.0000 meq | EXTENDED_RELEASE_TABLET | Freq: Once | ORAL | Status: AC
  Administered 2011-11-18: 40 meq via ORAL
  Filled 2011-11-18: qty 2

## 2011-11-18 MED ORDER — AZELASTINE HCL 0.1 % NA SOLN
2.0000 | Freq: Every day | NASAL | Status: DC | PRN
  Filled 2011-11-18: qty 30

## 2011-11-18 MED ORDER — LOSARTAN POTASSIUM 50 MG PO TABS
100.0000 mg | ORAL_TABLET | Freq: Every day | ORAL | Status: DC
  Administered 2011-11-18 – 2011-11-21 (×4): 100 mg via ORAL
  Filled 2011-11-18 (×4): qty 2

## 2011-11-18 MED ORDER — NAPROXEN 500 MG PO TABS
500.0000 mg | ORAL_TABLET | Freq: Two times a day (BID) | ORAL | Status: DC
  Administered 2011-11-19 – 2011-11-20 (×3): 500 mg via ORAL
  Administered 2011-11-20: 250 mg via ORAL
  Administered 2011-11-21: 500 mg via ORAL
  Filled 2011-11-18 (×7): qty 1

## 2011-11-18 MED ORDER — ALBUTEROL SULFATE (5 MG/ML) 0.5% IN NEBU: 2.5000 mg | INHALATION_SOLUTION | Freq: Four times a day (QID) | RESPIRATORY_TRACT | Status: DC | PRN

## 2011-11-18 MED ORDER — HYDROCHLOROTHIAZIDE 25 MG PO TABS
25.0000 mg | ORAL_TABLET | Freq: Every day | ORAL | Status: DC
  Administered 2011-11-18 – 2011-11-21 (×4): 25 mg via ORAL
  Filled 2011-11-18 (×4): qty 1

## 2011-11-18 MED ORDER — FLUTICASONE PROPIONATE HFA 44 MCG/ACT IN AERO
1.0000 | INHALATION_SPRAY | Freq: Two times a day (BID) | RESPIRATORY_TRACT | Status: DC
  Administered 2011-11-19 – 2011-11-21 (×5): 1 via RESPIRATORY_TRACT
  Filled 2011-11-18: qty 10.6

## 2011-11-18 MED ORDER — LOSARTAN POTASSIUM-HCTZ 100-25 MG PO TABS: 1.0000 | ORAL_TABLET | Freq: Every day | ORAL | Status: DC

## 2011-11-18 MED ORDER — TRAMADOL HCL 50 MG PO TABS
50.0000 mg | ORAL_TABLET | Freq: Three times a day (TID) | ORAL | Status: DC | PRN
  Administered 2011-11-19 – 2011-11-20 (×4): 50 mg via ORAL
  Filled 2011-11-18 (×4): qty 1

## 2011-11-18 MED ORDER — ACETAMINOPHEN 650 MG RE SUPP: 650.0000 mg | Freq: Four times a day (QID) | RECTAL | Status: DC | PRN

## 2011-11-18 MED ORDER — ALLOPURINOL 300 MG PO TABS
300.0000 mg | ORAL_TABLET | Freq: Every day | ORAL | Status: DC
  Administered 2011-11-18 – 2011-11-21 (×4): 300 mg via ORAL
  Filled 2011-11-18 (×4): qty 1

## 2011-11-18 MED ORDER — DOCUSATE SODIUM 100 MG PO CAPS: 100.0000 mg | ORAL_CAPSULE | Freq: Two times a day (BID) | ORAL | Status: DC | PRN

## 2011-11-18 MED ORDER — INSULIN ASPART 100 UNIT/ML ~~LOC~~ SOLN
0.0000 [IU] | Freq: Three times a day (TID) | SUBCUTANEOUS | Status: DC
  Administered 2011-11-19 – 2011-11-20 (×3): 3 [IU] via SUBCUTANEOUS
  Administered 2011-11-21: 2 [IU] via SUBCUTANEOUS

## 2011-11-18 MED ORDER — FUROSEMIDE 20 MG PO TABS
20.0000 mg | ORAL_TABLET | Freq: Every day | ORAL | Status: DC
  Administered 2011-11-18 – 2011-11-21 (×4): 20 mg via ORAL
  Filled 2011-11-18 (×4): qty 1

## 2011-11-18 MED ORDER — ASPIRIN EC 81 MG PO TBEC: 81.0000 mg | DELAYED_RELEASE_TABLET | Freq: Every day | ORAL | Status: DC

## 2011-11-18 MED ORDER — ONDANSETRON HCL 4 MG/2ML IJ SOLN: 4.0000 mg | Freq: Four times a day (QID) | INTRAMUSCULAR | Status: DC | PRN

## 2011-11-18 MED ORDER — ONDANSETRON HCL 4 MG PO TABS: 4.0000 mg | ORAL_TABLET | Freq: Four times a day (QID) | ORAL | Status: DC | PRN

## 2011-11-18 MED ORDER — ALBUTEROL SULFATE HFA 108 (90 BASE) MCG/ACT IN AERS
2.0000 | INHALATION_SPRAY | RESPIRATORY_TRACT | Status: DC | PRN
  Filled 2011-11-18: qty 6.7

## 2011-11-18 MED ORDER — ACETAMINOPHEN 325 MG PO TABS
650.0000 mg | ORAL_TABLET | Freq: Four times a day (QID) | ORAL | Status: DC | PRN
  Administered 2011-11-18 – 2011-11-20 (×4): 650 mg via ORAL
  Filled 2011-11-18 (×4): qty 2

## 2011-11-18 MED ORDER — SERTRALINE HCL 50 MG PO TABS
150.0000 mg | ORAL_TABLET | Freq: Every day | ORAL | Status: DC
  Administered 2011-11-18 – 2011-11-21 (×4): 150 mg via ORAL
  Filled 2011-11-18 (×4): qty 1

## 2011-11-18 MED ORDER — MONTELUKAST SODIUM 10 MG PO TABS
10.0000 mg | ORAL_TABLET | Freq: Every day | ORAL | Status: DC
  Administered 2011-11-18 – 2011-11-20 (×3): 10 mg via ORAL
  Filled 2011-11-18 (×4): qty 1

## 2011-11-18 MED ORDER — PREGABALIN 50 MG PO CAPS
100.0000 mg | ORAL_CAPSULE | Freq: Every day | ORAL | Status: DC
  Administered 2011-11-18 – 2011-11-21 (×4): 100 mg via ORAL
  Filled 2011-11-18 (×4): qty 2

## 2011-11-18 MED ORDER — SIMVASTATIN 40 MG PO TABS
40.0000 mg | ORAL_TABLET | Freq: Every evening | ORAL | Status: DC
  Administered 2011-11-18 – 2011-11-20 (×3): 40 mg via ORAL
  Filled 2011-11-18 (×4): qty 1

## 2011-11-18 MED ORDER — INSULIN GLARGINE 100 UNIT/ML ~~LOC~~ SOLN
30.0000 [IU] | Freq: Every day | SUBCUTANEOUS | Status: DC
  Administered 2011-11-19 – 2011-11-21 (×3): 30 [IU] via SUBCUTANEOUS

## 2011-11-18 MED ORDER — METOPROLOL SUCCINATE ER 50 MG PO TB24
50.0000 mg | ORAL_TABLET | Freq: Two times a day (BID) | ORAL | Status: DC
  Administered 2011-11-18 – 2011-11-21 (×6): 50 mg via ORAL
  Filled 2011-11-18 (×7): qty 1

## 2011-11-18 MED ORDER — LATANOPROST 0.005 % OP SOLN
1.0000 [drp] | Freq: Every day | OPHTHALMIC | Status: DC
  Administered 2011-11-18 – 2011-11-20 (×3): 1 [drp] via OPHTHALMIC
  Filled 2011-11-18: qty 2.5

## 2011-11-18 MED ORDER — BRIMONIDINE TARTRATE 0.1 % OP SOLN
1.0000 [drp] | Freq: Two times a day (BID) | OPHTHALMIC | Status: DC
  Administered 2011-11-18: 1 [drp] via OPHTHALMIC
  Filled 2011-11-18 (×8): qty 0.1

## 2011-11-18 MED ORDER — ACYCLOVIR 800 MG PO TABS
800.0000 mg | ORAL_TABLET | Freq: Two times a day (BID) | ORAL | Status: DC
  Administered 2011-11-18 – 2011-11-21 (×6): 800 mg via ORAL
  Filled 2011-11-18 (×7): qty 1

## 2011-11-18 NOTE — H&P (Signed)
[name] [name] is an 61 y.o. female.   Chief Complaint: fall with head hitting the ground, headache and dizziness HPI:  61 yo female with multiple medical problems including blindness, peripheral neuropathy who was admitted from the clinic after having fallen outside after her appointment. She reports having tripped on an uneven step along the side walk. She fell face first, hitting the left side of her face. She also fell on her left arm and her tail bone. She was helped back to the clinic and was able to walk without too much trouble. In the clinic, about after the fall, she started having dizziness (lightheadedness and sense of room spinning) and was unable to stand due to the dizziness and uneasiness on her feet. She denies any nausea or vomiting after the fall, any change in her baseline vision. She does have occasional episodes of dizziness, but this one appeared a little worst than usual. She describes her headache as a 10/10, localized to the left side of her face, around her eye.  Her left eyebrow had a laceration which was sutured at the clinic. Given her inability to walk on her own and her living at home alone, there was concern that she was not safe returning home and she was admitted for observation and for head imaging.   Past Medical History  Diagnosis Date  . Chronic cough     Now followed by pulmonary  . Sleep apnea     on CPAP 12  . HTN (hypertension)   . Dyslipidemia   . Diabetes mellitus   . Gout   . Vocal cord paresis   . History of colonoscopy   - cataracts  Past Surgical History  Procedure Date  . Colonoscopy w/ biopsies 11/21/2010    adenoma polyp--no hi grade dysplasia Dr [address]  . Treatment fistula anal   . Breast reduction surgery 04-21-2003  . Tubal ligation 1987  . Cardiac catheterization 03-02-2004  . Total abdominal hysterectomy 10-07-2000  . Rotator cuff repair 10-18-2003  . Bronchoscopy 07-2007  . Carpal tunnel release 2011  . Knee arthroscopy  08/2000    right  . Shoulder arthroscopy 08/2000    right  . Btl   . Naso-pharyn scope 09/11/06    ENT, normal  . T-score 09/02/04    -0.54 (low risk currently)    Family History  Problem Relation Age of Onset  . Diabetes Mother   . Colon cancer Neg Hx    Social History:  reports that she has quit smoking. She does not have any smokeless tobacco history on file. Her alcohol and drug histories not on file.  Allergies:  Allergies  Allergen Reactions  . Amlodipine Besylate     REACTION: swelling  . Lisinopril-Hydrochlorothiazide     REACTION: cough-- changed to arb  . Metronidazole     REACTION: unspecified  . Propoxyphene Hcl     REACTION: unspecified  . Valsartan     REACTION: unspecified    Medications Prior to Admission  Medication Sig Dispense Refill  . acyclovir (ZOVIRAX) 400 MG tablet Take 800 mg by mouth 2 (two) times daily.        [name] albuterol (PROAIR HFA) 108 (90 BASE) MCG/ACT inhaler Inhale 2 puffs into the lungs every 4 (four) hours as needed.        [name] albuterol (PROVENTIL) (2.5 MG/3ML) 0.083% nebulizer solution Take 3 mLs (2.5 mg total) by nebulization every 6 (six) hours as needed for wheezing or shortness of breath.  75 mL  12  . allopurinol (ZYLOPRIM) 300 MG tablet Take 300 mg by mouth daily.        [name] arformoterol (BROVANA) 15 MCG/2ML NEBU Take 15 mcg by nebulization 2 (two) times daily as needed.        [name] aspirin 325 MG tablet Take 325 mg by mouth daily.        [name] azelastine (ASTELIN) 137 MCG/SPRAY nasal spray 2 sprays by Nasal route daily as needed. Use in each nostril as directed       . beclomethasone (QVAR) 80 MCG/ACT inhaler Inhale 2 puffs into the lungs 2 (two) times daily.        . brimonidine (ALPHAGAN P) 0.1 % SOLN Place 1 drop into the left eye 2 (two) times daily.        [name] docusate sodium (COLACE) 100 MG capsule Take 1 capsule (100 mg total) by mouth 2 (two) times daily as needed for constipation.  180 capsule  1  . furosemide (LASIX) 20 MG tablet Take  20 mg by mouth daily.        . Glucosource Lancets MISC by Does not apply route. Lancets for glucometer testing.       . insulin glargine (LANTUS) 100 UNIT/ML injection 40units Subcutaneously daily disp qs 1 m in solostar pen this replaces 70-30 mix       . latanoprost (XALATAN) 0.005 % ophthalmic solution Place 1 drop into both eyes at bedtime.        [name] losartan-hydrochlorothiazide (HYZAAR) 100-25 MG per tablet Take 1 tablet by mouth daily.        . metFORMIN (GLUCOPHAGE) 1000 MG tablet Take 1.5 tablets by mouth in the am and 1 tablets in the PM       . metoprolol (TOPROL-XL) 50 MG 24 hr tablet Take 1 tablet (50 mg total) by mouth 2 (two) times daily.  60 tablet  6  . montelukast (SINGULAIR) 10 MG tablet Take 10 mg by mouth at bedtime.        . naproxen (NAPROSYN) 500 MG tablet Take 1 tablet (500 mg total) by mouth 2 (two) times daily with a meal.  60 tablet  0  . nitroGLYCERIN (NITROSTAT) 0.4 MG SL tablet Place 1 tablet (0.4 mg total) under the tongue every 5 (five) minutes as needed for chest pain.  100 tablet  3  . NON FORMULARY every 14 (fourteen) days. Allergy vaccine GH 1:10       . NON FORMULARY CPAP 12 advanced       . NON FORMULARY Diabetic shoes. Patient with peripheral neuropathy and loss of sensation in feet, some callouses.       . NON FORMULARY Flexfot CPAP mask 407       . polyethylene glycol powder (GLYCOLAX/MIRALAX) powder Start taking 8 grams in a glass of water a day and gradually taper up to 17 grams in a glass of water  One or two times a day as needed for constipation.  255 g  prn  . polyethylene glycol powder (GLYCOLAX/MIRALAX) powder MIX 17 GRAMS IN 8 OUNCES OF             LIQUID AND DRINK 2 TIMES A DAY.  255 g  12  . potassium chloride 40 MEQ/15ML (20%) LIQD Take 15 ml ONCE dialy      . pregabalin (LYRICA) 100 MG capsule Take one by mouth in the morning and two at bedtime.  90 capsule  12  . sertraline (ZOLOFT)  100 MG tablet Take 150 mg by mouth daily.        .  simvastatin (ZOCOR) 40 MG tablet Take 1 tablet (40 mg total) by mouth every evening.  30 tablet    . traMADol (ULTRAM) 50 MG tablet Take one or two tablets by mouth every 8 hours as needed for joint pain. Maximum of 6 tablets in 24 hour period.  180 tablet  5    Results for orders placed during the hospital encounter of 11/18/11 (from the past 48 hour(s))  COMPREHENSIVE METABOLIC PANEL     Status: Abnormal   Collection Time   11/18/11  7:15 PM      Component Value Range Comment   Sodium 142  135 - 145 (mEq/L)    Potassium 3.1 (*) 3.5 - 5.1 (mEq/L)    Chloride 100  96 - 112 (mEq/L)    CO2 30  19 - 32 (mEq/L)    Glucose, Bld 155 (*) 70 - 99 (mg/dL)    BUN 12  6 - 23 (mg/dL)    Creatinine, Ser 6.57  0.50 - 1.10 (mg/dL)    Calcium 9.7  8.4 - 10.5 (mg/dL)    Total Protein 7.3  6.0 - 8.3 (g/dL)    Albumin 3.6  3.5 - 5.2 (g/dL)    AST 21  0 - 37 (U/L)    ALT 21  0 - 35 (U/L)    Alkaline Phosphatase 123 (*) 39 - 117 (U/L)    Total Bilirubin 0.3  0.3 - 1.2 (mg/dL)    GFR calc non Af Amer >90  >90 (mL/min)    GFR calc Af Amer >90  >90 (mL/min)   GLUCOSE, CAPILLARY     Status: Abnormal   Collection Time   11/18/11  7:21 PM      Component Value Range Comment   Glucose-Capillary 162 (*) 70 - 99 (mg/dL)    No results found.  ROS Negative except per HPI Blood pressure 166/80, pulse 65, temperature 97.3 F (36.3 C), temperature source Oral, resp. rate 18, SpO2 98.00%. Physical Exam  Physical Examination: General appearance - alert, well appearing, and in no distress, sitting in bed. Mental status - alert, oriented to person, place, and time, can spell WORLD forward and backwards Eyes -PERRLA, EOMI, rotary nystagmus, cataract in right eye, wearing thick glasses.  Head - laceration on left eyebrow, with 2 stitches in place.  Heart - normal rate and rhythm, S1S2, 3/6 systolic murmur best heard at the right sternal border. Abdomen - soft, non tender, non distended Neurological - CN 2-12,  strength 5/5 in upper and lower extremities bilaterally, finger to nose not obtained due to vision impairment.  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  1. Fall: headache and dizziness appear to be due to concussion. Will obtain head ct to rule acute bleed For now, will hold off on any imaging of her arm as she has intact range of motion and no evidence of fracture.  - Pain control: home naproxen and tylenol. If pain not well controled will start morphine. - q4hr neuro checks - PT/OT 2. DM: on lantus 40u at home. In the acute setting, will start her on lantus 30units and start sliding scale. Hold metformin. 3. Hyperlipidemia: continue home simvastatin 4. Hypertension: elevated blood pressure on admission. Will  continue home meds: metoprolol 50mg  bid, losartan/hctz 100-25. She has an allergy to ACE inhibitors, but when questioned mentions that she tolerates losartan. Will start it and monitor closely for any side effects. Also on lasix 20mg  daily.  5. Depression: continue sertraline 150mg  daily 6. 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 Obstructive Sleep apnea: continue CPAP 9. Fen/Gi:  - carb modified diet - no evidence of dehydration on exam:  10. PPx: SCD's for now. Will wait for official CT read to rule out bleeding and then start SCD's 11. Dispo: pending improvement and PT/OT eval LOSQ, STEPHANIE 11/18/2011, 8:42 PM   FPTS R3 Addendum:  HPI:  61 year old patient with multiple medical problems including functional blindness, peripheral neuropathy, cataracts, history of vertigo, glaucoma who presents to sports medicine Center today for further evaluation of draining right ulcer under her breasts. She does sports  medicine Center she stumbled over a curb and fell forward and hit her head on concrete. She was diagnosed as likely concussion and sports medicine center but was admitted for overnight observation and CT scan of her head to rule out any bleed. She currently denies any confusion. She does complain of a headache. She does not complaining of any hand pain she is complaining of moderate to bone pain as well. She is unable to remember the exact mechanism of her fall.  PE: General alert and oriented. No acute distress. HEENT: Head with 1.5; laceration noted over left eyebrow. 3 interrupted sutures in place. No bleeding currently. Eyes patient is blind bilaterally. Nystagmus is present. Right eye with cataract versus glaucoma changes. Pupillary exam difficult secondary to blindness. Nose: Nares patent Mouth: Mucosa is moist Neck: No thyromegaly Heart:  Grade 2/6 systolic ejection murmur noted Lungs normal breathing. No crackles or rales noted. Abdomen: Soft nondistended nontender. Good bowel sounds. Extremities: Palpated bilateral upper extremities including fingers. Unable to note any displacement on exam. Peripheral neuropathy makes full exam and sensation difficult to assess Neuro: Alert and oriented x3. Able to spell world forwards and backwards. Recall 3 of 3. Did not assess gait.  Assessment and plan 61 year old female who suffered a fall where she hit her head after leaving sports medicine Center. She likely has concussion secondary to this.  #1 fall: CT scan of head to rule out a subdural bleed. She seems to be less confused less than she was a sports medicine Center. We're going to continue every 4 hours neurochecks. Much less likely to be skull fracture. Plan consult PT OT in the morning. #2. Diabetes mellitus type 2. She is on Lantus 40 units at home. We are decrease this and watch her blood sugars. #3. Hyperlipidemia continue statin #4. Hypertension: Continue home blood pressure  medicines. Other chronic medical problems please see excellent R1 above. FEN/GI:  Carb mod diet PPX:  SCDs until we can rule out head bleed Dispo:  Pending improvement  Steffon Gladu,JEFF 11/19/2011 1:06 AM

## 2011-11-18 NOTE — Progress Notes (Signed)
  Subjective:    Patient ID: [name] [name], female    DOB: [birth year], 61 y.o.   MRN: [id number]  HPI  Here for f/u skin ulcer right breast. Still draining.    Review of Systems    No fever Objective:   Physical Exam  SKIN dime sized ulceration under right breast---no induration, some early granulation tissue present. Slight serosanguinous drainage. No warmth      Assessment & Plan:  1. Skin ulcer---restore dressing with bactroban applied and tegaderm applied over this as difficult area. F/u with me for dressing change Wed am.  EVENT: After we saw [name] [name] for breast skin ulcer and had dressed it, she was on her way to the bus stop when she fell over a curb and face planted on the pavement. She made her way back to [facility].  She had some headache, no nausea or vomiting. She felt weak and "banged up"  EXAM:  SKIN:1.5 cm long laceration over left eyebrow was actively bleeding. No other skin abrasions noted on palms knees or legs.  NEURO: dizzy. (Patient blind so pupillary exam not useful. ) She could not stand with use of her cane alone as she typically does. MSK: bones of hands palp[ated and no irregularity noted (her peripheral neuropathy makes evaluation somewhat difficult as she has little feeling in her hands particularly left). Knees FROM in flex and extension.  PROCEDURES: Left forehead laceration cleaned and irrigated with saline. Local anaesthesia with 2 cc of 1%  Lidocaine. 3 interrupted sutures using 4-0 prolene were placed and hemostasis was easily achieved. We super glued her glasses back together---she will have to have the lens replaced.  She was very unsteady on getting up from bed after suturing and this did not resolve after sitting for 30 minutes or so. I suspect she has a concussion. I doubt head bleed but would likely be prudent to perform CT scan head. She has underlying co-morbidities including blindness and hx of vertigo.She lives with her disabled son.  I  convinced her it would be in her best interest to be admitted overnight for observation. We have called for a bed (room 3028), we will transport to ED via wheel chair / private vehicle. Inpatient team and her son notified.

## 2011-11-19 ENCOUNTER — Inpatient Hospital Stay (HOSPITAL_COMMUNITY): Payer: Medicare Other

## 2011-11-19 LAB — CBC
HCT: 37.1 % (ref 36.0–46.0)
Hemoglobin: 11.9 g/dL — ABNORMAL LOW (ref 12.0–15.0)
MCH: 27.4 pg (ref 26.0–34.0)
MCHC: 32.1 g/dL (ref 30.0–36.0)
MCV: 85.3 fL (ref 78.0–100.0)
Platelets: 211 10*3/uL (ref 150–400)
RBC: 4.35 MIL/uL (ref 3.87–5.11)
RDW: 14.4 % (ref 11.5–15.5)
WBC: 9.3 10*3/uL (ref 4.0–10.5)

## 2011-11-19 LAB — BASIC METABOLIC PANEL
BUN: 13 mg/dL (ref 6–23)
CO2: 27 mEq/L (ref 19–32)
Calcium: 9.8 mg/dL (ref 8.4–10.5)
Chloride: 99 mEq/L (ref 96–112)
Creatinine, Ser: 0.6 mg/dL (ref 0.50–1.10)
GFR calc Af Amer: >90 mL/min (ref >90)
GFR calc non Af Amer: >90 mL/min (ref >90)
Glucose, Bld: 215 mg/dL — ABNORMAL HIGH (ref 70–99)
Potassium: 3.5 mEq/L (ref 3.5–5.1)
Sodium: 138 mEq/L (ref 135–145)

## 2011-11-19 LAB — GLUCOSE, CAPILLARY
Glucose-Capillary: 179 mg/dL — ABNORMAL HIGH (ref 70–99)
Glucose-Capillary: 115 mg/dL — ABNORMAL HIGH (ref 70–99)
Glucose-Capillary: 191 mg/dL — ABNORMAL HIGH (ref 70–99)
Glucose-Capillary: 172 mg/dL — ABNORMAL HIGH (ref 70–99)

## 2011-11-19 MED ORDER — BRIMONIDINE TARTRATE 0.2 % OP SOLN
1.0000 [drp] | Freq: Two times a day (BID) | OPHTHALMIC | Status: DC
  Administered 2011-11-19 – 2011-11-21 (×5): 1 [drp] via OPHTHALMIC
  Filled 2011-11-19: qty 5

## 2011-11-19 NOTE — H&P (Signed)
FMTS Attending Admission Note: [name] Levy MD [phone] pager office [phone] I  have seen and examined this patient, reviewed their chart. I have discussed this patient with the resident. I agree with the resident's findings, assessment and care plan. Please also see my office note for day of admission.

## 2011-11-19 NOTE — Progress Notes (Signed)
FMTS Attending Daily Note: [name] Levy MD [phone] pager office [phone] I  have seen and examined this patient, reviewed their chart. I have discussed this patient with the resident. I agree with the resident's findings, assessment and care plan. [name] [name] has wrist pain and x ray negative for fracture---will use splint for comfort. Her laceration looks good without sign of infection. Her concussion is causing her to feel "not like myself---a bit off". She has intact mentation but feels slowed. I convinced her to stay another 24 hours and will re-eval in am.

## 2011-11-19 NOTE — Progress Notes (Signed)
Subjective: No acute events overnight. Patient has presence of dull headache and complains of slowed mentation. No N/V. No weakness or numbness. Ale to tolerate diet.  Objective: Vital signs in last 24 hours: Temp:  [97.3 F (36.3 C)-98.5 F (36.9 C)] 98.5 F (36.9 C) (03/19 0600) Pulse Rate:  [63-74] 63  (03/19 0600) Resp:  [18] 18  (03/19 0600) BP: (118-166)/(72-83) 118/73 mmHg (03/19 0600) SpO2:  [96 %-98 %] 98 % (03/19 0753) Weight:  [181 lb 10.5 oz (82.4 kg)] 181 lb 10.5 oz (82.4 kg) (03/18 2100) Weight change:  Last BM Date: 11/18/11  Intake/Output from previous day:   Intake/Output this shift:   Physical Exam: Gen: NAD Head: Lesion present above left eye with bandage present Lungs: CTAB Heart: RRR MSK: Mild pain upon palpation of left wrist Neuro: A&Ox3, gait slowed and mildly unsteady, circular nystagmus present eye exam, other cranial nerves grossly intact, strength and sensation grossly intat  Lab Results:  Basename 11/19/11 0610 11/18/11 2137  WBC 9.3 12.7*  HGB 11.9* 11.4*  HCT 37.1 36.0  PLT 211 221   BMET  Basename 11/19/11 0610 11/18/11 1915  NA 138 142  K 3.5 3.1*  CL 99 100  CO2 27 30  GLUCOSE 215* 155*  BUN 13 12  CREATININE 0.60 0.56  CALCIUM 9.8 9.7    Studies/Results: Ct Head Wo Contrast  11/19/2011  *RADIOLOGY REPORT*  Clinical Data: Status post fall on head; hit forehead, with dizziness.  CT HEAD WITHOUT CONTRAST  Technique:  Contiguous axial images were obtained from the base of the skull through the vertex without contrast.  Comparison: None.  Findings: There is no evidence of acute infarction, mass lesion, or intra- or extra-axial hemorrhage on CT.  The posterior fossa, including the cerebellum, brainstem and fourth ventricle, is within normal limits.  The third and lateral ventricles, and basal ganglia are unremarkable in appearance.  The cerebral hemispheres are symmetric in appearance, with normal gray- white differentiation.  No mass  effect or midline shift is seen.  There is no evidence of fracture; visualized osseous structures are unremarkable in appearance.  The orbits are within normal limits. The paranasal sinuses and mastoid air cells are well-aerated. Minimal soft tissue swelling is noted overlying the left frontal calvarium.  IMPRESSION:  1.  No evidence of traumatic intracranial injury or fracture. 2.  Minimal soft tissue swelling overlying the left frontal calvarium.  Original Report Authenticated By: Tonia Ghent, M.D.    Medications: I have reviewed the patient's current medications.  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   1. Head Trauma - CT showed no signs of intracranial bleed - Patient complains of slow mentation and seems to have a less steady gait compared to baseline (evaluated by PCP Dr. Jennette Kettle) - Patient likely suffering from concussive symptoms. Will keep patient for another 24 hours to monitor. - Pain control with naproxen and tylenol. - cont q4hr neuro checks - Repeat CT if neuro exam changes - ordered social work consult - ordered PT/OT evaluation  2. Wrist Trauma - Minimal swelling and mild pain on palpation - Obtained left wrist X-ray to evaluate for wrist trauma which occurred during fall - wrist splint applied  3. DM: - Continue lantus 30units and start sliding scale. Hold metformin.  - BS between 160-200  3. Hyperlipidemia: continue home simvastatin   4. Hypertension: -BP improved -Continue home meds:  -metoprolol  50mg  bid -losartan/hctz 100-25 -Lasix 20mg 5.   5. Depression: continue sertraline 150mg  daily  6. 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 Obstructive Sleep apnea: continue CPAP  9. Fen/Gi:  - carb modified diet  - no evidence of dehydration on exam:  10. PPx: SCD's for  now 11. Dispo: pending improvement and PT/OT eval   LOS: 1 day   Tussey, Shane 11/19/2011, 10:12 AM   I have seen and examined patient and discussed patient with Lavada Mesi and agree with assessment and plan.  Patient tripped and fell and suffered a left face laceration and bruise and may have experienced a concussion. She is still feeling dizzy. We will monitor her through today, get a PT/OT evaluation, and ask case manager to assess for any home healthy needs. The patient lives at home alone and helps take care of her son who works but has some mental disability.  We will also get an x-ray for her left wrist to evaluate for fracture since she fell on her hand and is now experiencing pain. Wrist splint ordered.    Etta Quill. Madolyn Frieze, MD PGY-2

## 2011-11-19 NOTE — Evaluation (Signed)
Occupational Therapy Evaluation Patient Details Name: [name] [name] MRN: [id number] DOB: 1951/03/26 Today's Date: 11/19/2011  Problem List:  Patient Active Problem List  Diagnoses  . HERPES SIMPLEX INFECTION, RECURRENT  . DIABETES MELLITUS II, UNCOMPLICATED  . HYPERLIPIDEMIA-MIXED  . DEPRESSION, MAJOR, RECURRENT  . OBSTRUCTIVE SLEEP APNEA  . CARPAL TUNNEL SYNDROME, BILATERAL  . PERIPHERAL NEUROPATHY  . GLAUCOMA  . NEURAL HEARING LOSS BILATERAL  . HYPERTENSION, BENIGN SYSTEMIC  . PARALYSIS, VOCAL CORD, UNILATERAL, TOTAL  . GERD  . IRRITABLE BOWEL SYNDROME  . GAIT IMBALANCE  . CATARACT EXTRACTION STATUS  . DEGENERATIVE JOINT DISEASE, KNEE  . Thoracolumbar back pain  . Allergic rhinitis due to pollen  . Hip pain, bilateral  . Knee pain, bilateral  . Hip arthritis  . Nail dystrophy    Past Medical History:  Past Medical History  Diagnosis Date  . Chronic cough     Now followed by pulmonary  . Sleep apnea     on CPAP 12  . HTN (hypertension)   . Dyslipidemia   . Gout   . History of colonoscopy   . Concussion 11/18/11    "fell at dr's office; hit head"  . Complication of anesthesia     "just wake up coughing; that's all"  . Heart murmur   . Asthma   . COPD (chronic obstructive pulmonary disease)   . Pneumonia     "i've had it once" (11/18/11)  . Diabetes mellitus type II     "take insulin & pills"  . Blood transfusion   . GERD (gastroesophageal reflux disease)   . Headache 11/18/11    "I've had mild headaches the last couple days"  . Arthritis   . Chronic lower back pain   . Vocal cord paralysis, unilateral partial     "right"  . Depression    Past Surgical History:  Past Surgical History  Procedure Date  . Colonoscopy w/ biopsies 11/21/2010    adenoma polyp--no hi grade dysplasia Dr [address]  . Treatment fistula anal   . Breast reduction surgery 04-21-2003  . Tubal ligation 1987  . Cardiac catheterization 03-02-2004  . Bronchoscopy 07-2007  . Carpal tunnel  release 2011    left  . Knee arthroscopy 08/2000    right  . Shoulder arthroscopy 08/2000    right  . Naso-pharyn scope 09/11/06    ENT, normal  . T-score 09/02/04    -0.54 (low risk currently)  . Total abdominal hysterectomy 10-07-2000  . Cesarean section 1987  . Shoulder arthroscopy w/ rotator cuff repair 10/18/2003    left  . Breast surgery   . Cataract extraction 1955; 1979    bilateral; right eye  . Eye surgery     OT Assessment/Plan/Recommendation OT Assessment Clinical Impression Statement: 61 yo female s/p fall that currently could benefit from skilled OT acutely. Pt progressing well with therapy today OT Recommendation/Assessment: Patient will need skilled OT in the acute care venue OT Problem List: Decreased strength;Decreased activity tolerance;Impaired balance (sitting and/or standing);Impaired vision/perception;Decreased safety awareness;Decreased knowledge of use of DME or AE;Decreased knowledge of precautions;Pain OT Therapy Diagnosis : Generalized weakness;Acute pain OT Plan OT Frequency: Min 2X/week OT Treatment/Interventions: Self-care/ADL training;DME and/or AE instruction;Therapeutic activities;Patient/family education;Balance training OT Recommendation Follow Up Recommendations: Home health OT Equipment Recommended: Defer to next venue Individuals Consulted Consulted and Agree with Results and Recommendations: Patient OT Goals Acute Rehab OT Goals OT Goal Formulation: With patient Time For Goal Achievement: 2 weeks ADL Goals Pt Will Perform Grooming:  with modified independence;Standing at sink ADL Goal: Grooming - Progress: Goal set today Pt Will Perform Upper Body Bathing: with modified independence;Standing at sink ADL Goal: Upper Body Bathing - Progress: Goal set today Pt Will Perform Lower Body Bathing: with modified independence;Standing at sink ADL Goal: Lower Body Bathing - Progress: Goal set today Pt Will Perform Upper Body Dressing: with modified  independence;Sit to stand from chair ADL Goal: Upper Body Dressing - Progress: Goal set today Pt Will Perform Lower Body Dressing: with modified independence;Sit to stand from chair;Sit to stand from bed ADL Goal: Lower Body Dressing - Progress: Goal set today Pt Will Transfer to Toilet: with modified independence;3-in-1 ADL Goal: Toilet Transfer - Progress: Goal set today Pt Will Perform Toileting - Clothing Manipulation: with modified independence;Standing ADL Goal: Toileting - Clothing Manipulation - Progress: Goal set today Pt Will Perform Toileting - Hygiene: with modified independence;Sit to stand from 3-in-1/toilet ADL Goal: Toileting - Hygiene - Progress: Goal set today  OT Evaluation Precautions/Restrictions  Precautions Precautions: Fall Restrictions Weight Bearing Restrictions: No Prior Functioning Home Living Lives With: Son Type of Home: Apartment Home Layout: One level Home Access: Level entry Bathroom Shower/Tub: Engineer, manufacturing systems: Standard Bathroom Accessibility: Yes How Accessible: Accessible via walker Home Adaptive Equipment: Walker - four wheeled Additional Comments: had shower bench which broke Prior Function Level of Independence: Requires assistive device for independence Driving: No Comments: Uses cane in community.  ADL ADL Eating/Feeding: Performed;Set up Where Assessed - Eating/Feeding: Chair Grooming: Performed;Wash/dry hands;Set up Where Assessed - Grooming: Standing at sink Toilet Transfer: Performed;Minimal assistance Toilet Transfer Method: Proofreader: Raised toilet seat with arms (or 3-in-1 over toilet) Toileting - Clothing Manipulation: Performed;Minimal assistance Where Assessed - Toileting Clothing Manipulation: Sit to stand from 3-in-1 or toilet Toileting - Hygiene: Set up Where Assessed - Toileting Hygiene: Sit on 3-in-1 or toilet Equipment Used: Cane Ambulation Related to ADLs: Pt ambulating  with Min (A) from restroom and located phone. Pt phone ringing and it was her son calling in distress because he is stuck at [place] bus stop in [place] ADL Comments: Pt completed toilet transfer, grooming task at sink, answered phone and drinking from cup in chair. Vision/Perception  Vision - History Baseline Vision: Wears glasses all the time Visual History: Other (comment) (strabimus at baseline) Patient Visual Report: No change from baseline Vision - Assessment Eye Alignment: Impaired (comment) Cognition Cognition Arousal/Alertness: Awake/alert Overall Cognitive Status: Impaired Orientation Level: Oriented X4 Cognition - Other Comments: Slow to process Sensation/Coordination Coordination Gross Motor Movements are Fluid and Coordinated: Yes Fine Motor Movements are Fluid and Coordinated: Yes Extremity Assessment RUE Assessment RUE Assessment: Within Functional Limits (grossly) LUE Assessment LUE Assessment: Within Functional Limits (grossly) Mobility  Bed Mobility Bed Mobility: No Transfers Transfers: Yes Sit to Stand: 4: Min assist;With upper extremity assist;From chair/3-in-1;With armrests Stand to Sit: 4: Min assist;With upper extremity assist;With armrests;To chair/3-in-1 Exercises   End of Session OT - End of Session Activity Tolerance: Patient tolerated treatment well Patient left: in chair;with call bell in reach Nurse Communication: Mobility status for transfers;Mobility status for ambulation General Behavior During Session: [facility] for tasks performed Cognition: [facility] for tasks performed   Lucile Shutters 11/19/2011, 3:57 PM  Pager: [phone]

## 2011-11-19 NOTE — Progress Notes (Signed)
Received request to assist with transportation, this is within the scope of social work, request forwarded.  Johny Shock RN MPH Case Manager [phone]

## 2011-11-19 NOTE — Progress Notes (Signed)
Orthopedic Tech Progress Note Patient Details:  [name] [name] 1951/05/22 [id number]  Other Ortho Devices Ortho Device Location: wist splint left Ortho Device Interventions: Application   [name] [name], [name] [name] 11/19/2011, 10:44 AM

## 2011-11-19 NOTE — Evaluation (Signed)
Physical Therapy Evaluation Patient Details Name: CAMYLLE WHICKER MRN: [id number] DOB: Apr 28, 1951 Today's Date: 11/19/2011  Problem List:  Patient Active Problem List  Diagnoses  . HERPES SIMPLEX INFECTION, RECURRENT  . DIABETES MELLITUS II, UNCOMPLICATED  . HYPERLIPIDEMIA-MIXED  . DEPRESSION, MAJOR, RECURRENT  . OBSTRUCTIVE SLEEP APNEA  . CARPAL TUNNEL SYNDROME, BILATERAL  . PERIPHERAL NEUROPATHY  . GLAUCOMA  . NEURAL HEARING LOSS BILATERAL  . HYPERTENSION, BENIGN SYSTEMIC  . PARALYSIS, VOCAL CORD, UNILATERAL, TOTAL  . GERD  . IRRITABLE BOWEL SYNDROME  . GAIT IMBALANCE  . CATARACT EXTRACTION STATUS  . DEGENERATIVE JOINT DISEASE, KNEE  . Thoracolumbar back pain  . Allergic rhinitis due to pollen  . Hip pain, bilateral  . Knee pain, bilateral  . Hip arthritis  . Nail dystrophy    Past Medical History:  Past Medical History  Diagnosis Date  . Chronic cough     Now followed by pulmonary  . Sleep apnea     on CPAP 12  . HTN (hypertension)   . Dyslipidemia   . Gout   . History of colonoscopy   . Concussion 11/18/11    "fell at dr's office; hit head"  . Complication of anesthesia     "just wake up coughing; that's all"  . Heart murmur   . Asthma   . COPD (chronic obstructive pulmonary disease)   . Pneumonia     "i've had it once" (11/18/11)  . Diabetes mellitus type II     "take insulin & pills"  . Blood transfusion   . GERD (gastroesophageal reflux disease)   . Headache 11/18/11    "I've had mild headaches the last couple days"  . Arthritis   . Chronic lower back pain   . Vocal cord paralysis, unilateral partial     "right"  . Depression    Past Surgical History:  Past Surgical History  Procedure Date  . Colonoscopy w/ biopsies 11/21/2010    adenoma polyp--no hi grade dysplasia [address]  . Treatment fistula anal   . Breast reduction surgery 04-21-2003  . Tubal ligation 1987  . Cardiac catheterization 03-02-2004  . Bronchoscopy 07-2007  . Carpal tunnel  release 2011    left  . Knee arthroscopy 08/2000    right  . Shoulder arthroscopy 08/2000    right  . Naso-pharyn scope 09/11/06    ENT, normal  . T-score 09/02/04    -0.54 (low risk currently)  . Total abdominal hysterectomy 10-07-2000  . Cesarean section 1987  . Shoulder arthroscopy w/ rotator cuff repair 10/18/2003    left  . Breast surgery   . Cataract extraction 1955; 1979    bilateral; right eye  . Eye surgery     PT Assessment/Plan/Recommendation PT Assessment Clinical Impression Statement: 61 yo female s/p fall that currently could benefit from skilled PT to address imbalance. Pt with increased sway in static stance as well as with gait. Pt will likely be fine with HHPT however she does not have assist/supervision for several hours during the day therefore she will need to reach modified indiependent level for safe return home. otherwise she will need SNF. PT Recommendation/Assessment: Patient will need skilled PT in the acute care venue PT Problem List: Decreased strength;Decreased activity tolerance;Decreased balance;Decreased mobility;Decreased knowledge of use of DME;Pain Barriers to Discharge: Decreased caregiver support PT Therapy Diagnosis : Difficulty walking;Abnormality of gait;Generalized weakness;Acute pain PT Plan PT Frequency: Min 3X/week PT Treatment/Interventions: DME instruction;Gait training;Functional mobility training;Therapeutic activities;Therapeutic exercise;Balance training;Patient/family education PT Recommendation  Follow Up Recommendations: Home health PT;Supervision for mobility/OOB Equipment Recommended: Tub/shower bench PT Goals  Acute Rehab PT Goals PT Goal Formulation: With patient Time For Goal Achievement: 2 weeks Pt will go Supine/Side to Sit: Independently PT Goal: Supine/Side to Sit - Progress: Goal set today Pt will go Sit to Supine/Side: Independently PT Goal: Sit to Supine/Side - Progress: Goal set today Pt will go Sit to Stand: with  modified independence PT Goal: Sit to Stand - Progress: Goal set today Pt will go Stand to Sit: with modified independence PT Goal: Stand to Sit - Progress: Goal set today Pt will Ambulate: 51 - 150 feet;with modified independence;with least restrictive assistive device PT Goal: Ambulate - Progress: Goal set today Pt will Perform Home Exercise Program: Independently PT Goal: Perform Home Exercise Program - Progress: Goal set today  PT Evaluation Precautions/Restrictions  Precautions Precautions: Fall Restrictions Weight Bearing Restrictions: No Prior Functioning  Home Living Lives With: Son Type of Home: Apartment Home Layout: One level Home Access: Level entry Bathroom Shower/Tub: Engineer, manufacturing systems: Standard Bathroom Accessibility: Yes How Accessible: Accessible via walker Home Adaptive Equipment: Walker - four wheeled Additional Comments: had shower bench which broke Prior Function Level of Independence: Requires assistive device for independence Driving: No Comments: Uses cane in community.  Cognition Cognition Arousal/Alertness: Awake/alert Overall Cognitive Status: Impaired Orientation Level: Oriented X4 Cognition - Other Comments: Slow to process Sensation/Coordination Sensation Light Touch: Appears Intact Coordination Gross Motor Movements are Fluid and Coordinated: Yes Fine Motor Movements are Fluid and Coordinated: Yes Extremity Assessment RUE Assessment RUE Assessment: Within Functional Limits (grossly) LUE Assessment LUE Assessment: Within Functional Limits (grossly) RLE Assessment RLE Assessment: Exceptions to [facility] RLE AROM (degrees) Overall AROM Right Lower Extremity: Within functional limits for tasks assessed RLE Strength RLE Overall Strength: Deficits RLE Overall Strength Comments: Generalized deconditioning, grossly >/= 4-/5 LLE Assessment LLE Assessment: Exceptions to WFL LLE AROM (degrees) Overall AROM Left Lower Extremity:  Within functional limits for tasks assessed LLE Strength LLE Overall Strength: Deficits LLE Overall Strength Comments: Generalized deconditioning, grossly >/= 4-/5 Mobility (including Balance) Bed Mobility Bed Mobility: No Transfers Sit to Stand: With upper extremity assist;With armrests;3: Mod assist;From bed Sit to Stand Details (indicate cue type and reason): Cues for UE placement, anterior translation into standing Stand to Sit: With upper extremity assist;With armrests;To chair/3-in-1;3: Mod assist Stand to Sit Details: Pt with decreased control of descent secondary to pt reports of sacrum pain. PT to lower pt safely.  Ambulation/Gait Ambulation/Gait: Yes Ambulation/Gait Assistance: 4: Min assist Ambulation/Gait Assistance Details (indicate cue type and reason): constant min assist secondary to increased sway. Pt with multiple standing rest breaks. Pt with wide base of support indicating imbalance.  Ambulation Distance (Feet): 30 Feet Assistive device: Straight cane Gait Pattern: Step-through pattern;Decreased stride length;Shuffle (wide base of support, minimal foot clearence)  Posture/Postural Control Posture/Postural Control: No significant limitations Balance Balance Assessed: Yes Static Standing Balance Static Standing - Balance Support: Left upper extremity supported;During functional activity Static Standing - Comment/# of Minutes: Pt requires up to min assist with static standing secondary to increased anterior/posterior sway with decreased corrective reactions. End of Session PT - End of Session Equipment Utilized During Treatment: Gait belt Activity Tolerance: Patient tolerated treatment well;Patient limited by fatigue Patient left: in chair;with call bell in reach Nurse Communication: Mobility status for ambulation General Behavior During Session: [facility] for tasks performed Cognition: [facility] for tasks performed  Wilhemina Bonito 11/19/2011, 4:47 PM  Dahlia Client  (Cheek) Carleene Mains PT, DPT  Acute Rehabilitation [phone]

## 2011-11-19 NOTE — Progress Notes (Signed)
Placed pt on nasal CPAP auto titrate mode no O2 bled in. Pt tolerating well. No complications noted.

## 2011-11-20 ENCOUNTER — Ambulatory Visit: Payer: Medicare Other

## 2011-11-20 ENCOUNTER — Telehealth: Payer: Self-pay | Admitting: Family Medicine

## 2011-11-20 LAB — GLUCOSE, CAPILLARY
Glucose-Capillary: 154 mg/dL — ABNORMAL HIGH (ref 70–99)
Glucose-Capillary: 123 mg/dL — ABNORMAL HIGH (ref 70–99)
Glucose-Capillary: 171 mg/dL — ABNORMAL HIGH (ref 70–99)
Glucose-Capillary: 128 mg/dL — ABNORMAL HIGH (ref 70–99)
Glucose-Capillary: 196 mg/dL — ABNORMAL HIGH (ref 70–99)

## 2011-11-20 NOTE — Telephone Encounter (Signed)
[name] need you to call her at the extension to her room at the hospital.  Need to discuss about having rehab.  May not be able to get into the one at the hospital.

## 2011-11-20 NOTE — Telephone Encounter (Signed)
Discussed with PT and Ms Shoults at her hospital room, also talked with [name] [name] teh PA from our inpt rehab---will have PT eval again today and I think SNF for 1-2 w is likely best option as she is still quite wobbly. 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

## 2011-11-20 NOTE — Progress Notes (Addendum)
Clinical Child psychotherapist (CSW) met with pt to discuss placement options. Pt states she is agreeable to placement however prefers something close to her home. Pt insurance will need to review PT/OT notes prior to determining whether they will cover SNF placement for pt.    Theresia Bough, MSW, Theresia Majors [phone]

## 2011-11-20 NOTE — Progress Notes (Signed)
Subjective: No acute events overnight. Slept well. Still feeling unsteady and foggy. No new symptoms. Patient endorses interest in short term rehab.  Objective: Vital signs in last 24 hours: Temp:  [97.5 F (36.4 C)-98.1 F (36.7 C)] 97.6 F (36.4 C) (03/20 0520) Pulse Rate:  [59-69] 59  (03/20 0520) Resp:  [18-20] 20  (03/20 0520) BP: (102-119)/(65-73) 112/73 mmHg (03/20 0520) SpO2:  [92 %-93 %] 92 % (03/20 0520) Weight change:  Last BM Date: 11/18/11  Intake/Output from previous day:   Intake/Output this shift:    General appearance: NAD, sitting in chair Head: Normocephalic, with ~2cm laceration closd with 2 stitches present above left eye Eyes: EOMi, circular nystagmus present during eye movements Resp: clear to auscultation bilaterally Cardio: RRR Neurologic: Cranial nerves: normal Sensory: normal, grossly intact Motor: LUE weakness present (reportadly stable) strength intact otherwise TTP over left wrist, splint in place.  Lab Results:  [facility] 11/19/11 0610 11/18/11 2137  WBC 9.3 12.7*  HGB 11.9* 11.4*  HCT 37.1 36.0  PLT 211 221   BMET  Basename 11/19/11 0610 11/18/11 1915  NA 138 142  K 3.5 3.1*  CL 99 100  CO2 27 30  GLUCOSE 215* 155*  BUN 13 12  CREATININE 0.60 0.56  CALCIUM 9.8 9.7    Studies/Results: Dg Wrist Complete Left  11/19/2011  *RADIOLOGY REPORT*  Clinical Data: Pain post fall, rule out fracture  LEFT WRIST - COMPLETE 3+ VIEW  Comparison: None.  Findings: Four views of the left wrist submitted.  No acute fracture or subluxation.  Mild osteopenia.  Mild degenerative changes first carpal metacarpal joint.  IMPRESSION: No acute fracture or subluxation.  Mild osteopenia.  Original Report Authenticated By: Natasha Mead, M.D.   Ct Head Wo Contrast  11/19/2011  *RADIOLOGY REPORT*  Clinical Data: Status post fall on head; hit forehead, with dizziness.  CT HEAD WITHOUT CONTRAST  Technique:  Contiguous axial images were obtained from the base of the  skull through the vertex without contrast.  Comparison: None.  Findings: There is no evidence of acute infarction, mass lesion, or intra- or extra-axial hemorrhage on CT.  The posterior fossa, including the cerebellum, brainstem and fourth ventricle, is within normal limits.  The third and lateral ventricles, and basal ganglia are unremarkable in appearance.  The cerebral hemispheres are symmetric in appearance, with normal gray- white differentiation.  No mass effect or midline shift is seen.  There is no evidence of fracture; visualized osseous structures are unremarkable in appearance.  The orbits are within normal limits. The paranasal sinuses and mastoid air cells are well-aerated. Minimal soft tissue swelling is noted overlying the left frontal calvarium.  IMPRESSION:  1.  No evidence of traumatic intracranial injury or fracture. 2.  Minimal soft tissue swelling overlying the left frontal calvarium.  Original Report Authenticated By: Tonia Ghent, M.D.    Medications: I have reviewed the patient's current medications.  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  1. Head Trauma  - CT showed no signs of intracranial bleed; neuro exam is stable - Patient continues to complain of slowed mentation, gait imbalance and weakness since her fall. - Patient interested in short-term inpatient rehabilitation. - Pain control with naproxen and tylenol.  - cont q4hr neuro checks  - Repeat CT if neuro exam changes    2. Wrist Trauma  - Minimal pain on palpation; splint sitting at bedside - CXR  showed - No acute fracture  3. DM:  - Continue lantus 30units and start sliding scale. Hold metformin.  - BS between 160-200  3. Hyperlipidemia: continue home simvastatin  4. Hypertension:  -Continue home meds:  -metoprolol 50mg  bid  -losartan/hctz 100-25  -Lasix 20mg 5.  5.  Depression: continue sertraline 150mg  daily  6. 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 Obstructive Sleep apnea: continue CPAP  9. Fen/Gi:  - carb modified diet  10. PPx: SCD's for now  11. Dispo: patient will benefit from short term rehab, discharge pending placement   LOS: 2 days   Tussey, Shane 11/20/2011, 9:15 AM   I have seen patient and examined. Agree with MS4. Patient with post-fall concussion symptoms persistant, hindering from safe return home as she is alone majority of daytime. Medical problems as above all stable. PT rec short term rehab. Have discussed with SW this disposition to SNF as patient seems willing.   Lloyd Huger, MD [facility] Resident - PGY-2 11/20/2011 1:56 PM

## 2011-11-20 NOTE — Progress Notes (Signed)
Physical medicine and rehabilitation consult requested. Chart has been reviewed. Physical and occupational therapy evaluations completed March 19. Recommendations are for home health therapies was supervision if this could not be obtained it would need short-term skilled nursing facility due to social support. Patient is currently in a minimal assist level with a straight cane for ambulation. Cranial CT scan was negative for any acute changes and no evidence of traumatic intracranial injury. This has been discussed with physical therapy as well as message left with case management. Patient currently would not qualify for inpatient rehabilitation services. Will hold on formal rehabilitation consult at this time if any changes were to occur please reconsult

## 2011-11-20 NOTE — Progress Notes (Signed)
Occupational Therapy Treatment Patient Details Name: [name] [name] MRN: [id number] DOB: 11/02/1950 Today's Date: 11/20/2011  OT Assessment/Plan OT Assessment/Plan Comments on Treatment Session: Pt with increased fatigue today and c/o headache.  Pt performed simulated toilet transfer and ambulated throughout room with continuous min. assist for safety and balance.  Pt demonstrates increased anterior/posterior sway during ambulation and requires several rest breaks over a short distance.  Pt reports that the distance from her bed to bathroom at home is farther than she walked today.  Pt would be home alone for significant portion of day and would not have necessary level of assist for safe mobility at home. Pt agreeable to d/c to SNF in order to receive further rehab before eventual d/c home. OT Plan: Discharge plan needs to be updated OT Frequency: Min 2X/week Follow Up Recommendations: Skilled nursing facility Equipment Recommended: Defer to next venue OT Goals ADL Goals Pt Will Transfer to Toilet: with modified independence;3-in-1 ADL Goal: Toilet Transfer - Progress: Progressing toward goals  OT Treatment Precautions/Restrictions  Precautions Precautions: Fall Restrictions Weight Bearing Restrictions: No   ADL ADL Lower Body Dressing: Performed;+1 Total assistance Lower Body Dressing Details (indicate cue type and reason): +1 total assist to don shoes Where Assessed - Lower Body Dressing: Sitting, bed Toilet Transfer: Simulated;Minimal assistance Toilet Transfer Details (indicate cue type and reason): assist for steadying and balance. pt closed eyes for several steps and states "I didn't realize I closed my eyes." Toilet Transfer Method: Ambulating Toilet Transfer Equipment: Other (comment) (chair) Equipment Used: Cane Ambulation Related to ADLs: Pt ambulated with min assist throughout room while performing simulated toilet transfer.  Pt very unsteady and frequently sways  anteriorly/posteriorly.  ADL Comments: Pt demonstrates anterior/posterior sway with ~1 1/2 minutes of static standing balance at bedside. Mobility  Bed Mobility Bed Mobility: Yes Supine to Sit: With rails;4: Min assist Supine to Sit Details (indicate cue type and reason): Pt required increased time to bring trunk OOB. Min guard assist for safety to pt's trunk. Sitting - Scoot to Edge of Bed: Other (comment) (min guard assist) Sitting - Scoot to Edge of Bed Details (indicate cue type and reason): min guard assist for safety Transfers Transfers: Yes Sit to Stand: With upper extremity assist;With armrests;From bed;4: Min assist;From chair/3-in-1 Sit to Stand Details (indicate cue type and reason): Cues for UE placement, anterior translation into standing. Pt with need for min assist in standing secondary to anterior/posterior sway.  Stand to Sit: With upper extremity assist;With armrests;To chair/3-in-1;3: Mod assist Exercises    End of Session OT - End of Session Equipment Utilized During Treatment: Gait belt Activity Tolerance: Patient limited by fatigue Patient left: in chair;with call bell in reach General Behavior During Session: [facility] for tasks performed Cognition: Impaired Cognitive Impairment: Pt with slow processing, decreased memory evident today.   4:11 PM 11/20/2011 Cipriano Mile OTR/L Pager [phone] Office [phone]

## 2011-11-20 NOTE — Progress Notes (Signed)
Physical Therapy Treatment Patient Details Name: [name] [name] MRN: [id number] DOB: 1951/01/15 Today's Date: 11/20/2011  PT Assessment/Plan  PT - Assessment/Plan Comments on Treatment Session: 61 yo female s/p fall. Pt presented today with significant weakness, unable to ambulated greater than 16 feet at a time. When questioned pt will not be able to reach her bathroom or kitchen at this point. She also requires constant min assist for ambulation and will not have level of assist needed at home as her son works most of the day. She reports frequent "near falls" too numerous to count at home further indicating her high fall risk. PT fully recommending SNF placement based on today's assessment as she will be a very high risk of falling and further injury with return home with limited support.  PT Plan: Discharge plan needs to be updated PT Frequency: Min 3X/week Follow Up Recommendations: Supervision for mobility/OOB;Skilled nursing facility Equipment Recommended: Defer to next venue PT Goals  Acute Rehab PT Goals Pt will go Supine/Side to Sit: Independently PT Goal: Supine/Side to Sit - Progress: Progressing toward goal Pt will go Sit to Stand: with modified independence PT Goal: Sit to Stand - Progress: Progressing toward goal Pt will go Stand to Sit: with modified independence PT Goal: Stand to Sit - Progress: Progressing toward goal Pt will Ambulate: 51 - 150 feet;with modified independence;with least restrictive assistive device PT Goal: Ambulate - Progress: Progressing toward goal Pt will Perform Home Exercise Program: Independently PT Goal: Perform Home Exercise Program - Progress: Progressing toward goal  PT Treatment Precautions/Restrictions  Precautions Precautions: Fall Restrictions Weight Bearing Restrictions: No Mobility (including Balance) Bed Mobility Bed Mobility: Yes Supine to Sit: With rails;Other (comment) (min guard assist) Supine to Sit Details (indicate cue  type and reason): Pt required increased time to bring trunk OOB. Min guard assist for safety to pt's trunk. Sitting - Scoot to Edge of Bed: Other (comment) (min guard assist) Sitting - Scoot to Edge of Bed Details (indicate cue type and reason): min guard assist for safety Transfers Sit to Stand: With upper extremity assist;With armrests;From bed;4: Min assist;From chair/3-in-1 Sit to Stand Details (indicate cue type and reason): Cues for UE placement, anterior translation into standing. Pt with need for min assist in standing secondary to anterior/posterior sway.  Stand to Sit: With upper extremity assist;With armrests;To chair/3-in-1;3: Mod assist Ambulation/Gait Ambulation/Gait: Yes Ambulation/Gait Assistance: 4: Min assist Ambulation/Gait Assistance Details (indicate cue type and reason): constant min assist secondary to increased sway. Pt with wide base of support indicating imbalance. Pt unable to ambulate >16' at a time, pt will not be able to ambulate to bathroom by self safely Ambulation Distance (Feet): 32 Feet (with one seated rest, unable to ambulate back to room) Assistive device: Straight cane Gait Pattern: Step-through pattern;Decreased stride length;Shuffle (wide base of support, minimal foot clearence) Stairs: No  Posture/Postural Control Posture/Postural Control: No significant limitations Balance Balance Assessed: Yes Static Standing Balance Static Standing - Balance Support: Left upper extremity supported;During functional activity Static Standing - Comment/# of Minutes: Pt requires frequent min assist with static standing secondary to increased anterior/posterior sway with decreased corrective reactions End of Session PT - End of Session Equipment Utilized During Treatment: Gait belt Activity Tolerance: Patient limited by fatigue Patient left: in chair;with call bell in reach Nurse Communication: Mobility status for ambulation General Behavior During Session: [facility] for  tasks performed Cognition: Impaired Cognitive Impairment: Pt with slow processing, decreased memory evident today.   [name] 11/20/2011, 4:08 PM  [name] Carleene Mains PT, DPT Acute Rehabilitation [phone]

## 2011-11-20 NOTE — Progress Notes (Addendum)
Received call re CIR for this pt, as this pt does not meet criteria, eval on hold due to inappropriatiness of this evaluation and this CM notified by rehab rep. Call place to MD and asked to cancel this inappropriate order and order [facility] if needed.  Johny Shock RN MPH Case Manager [phone]

## 2011-11-21 ENCOUNTER — Non-Acute Institutional Stay: Payer: Self-pay | Admitting: Family Medicine

## 2011-11-21 DIAGNOSIS — R269 Unspecified abnormalities of gait and mobility: Secondary | ICD-10-CM

## 2011-11-21 DIAGNOSIS — I10 Essential (primary) hypertension: Secondary | ICD-10-CM

## 2011-11-21 DIAGNOSIS — E119 Type 2 diabetes mellitus without complications: Secondary | ICD-10-CM

## 2011-11-21 LAB — GLUCOSE, CAPILLARY
Glucose-Capillary: 145 mg/dL — ABNORMAL HIGH (ref 70–99)
Glucose-Capillary: 104 mg/dL — ABNORMAL HIGH (ref 70–99)

## 2011-11-21 MED ORDER — NAPROXEN 500 MG PO TABS: 500.0000 mg | ORAL_TABLET | Freq: Two times a day (BID) | ORAL | Status: DC | PRN

## 2011-11-21 MED ORDER — ACETAMINOPHEN 325 MG PO TABS: 650.0000 mg | ORAL_TABLET | Freq: Four times a day (QID) | ORAL | Status: DC | PRN

## 2011-11-21 MED ORDER — ASPIRIN 81 MG PO TABS: 325.0000 mg | ORAL_TABLET | Freq: Every day | ORAL | Status: DC

## 2011-11-21 NOTE — Assessment & Plan Note (Addendum)
Likely cause of falls, but will look for reversible factors. Physical therapy and assistive device review

## 2011-11-21 NOTE — Assessment & Plan Note (Signed)
well controlled  

## 2011-11-21 NOTE — Discharge Summary (Signed)
Physician Discharge Summary  Patient ID: [name] [name] MRN: [id number] DOB/AGE: 60-30-1952 60 y.o.  Admit date: 11/18/2011 Discharge date: 11/21/2011  Admission Diagnoses: DM Hyperlipidemia HTN Depression Peripheral neuropathy  Recurrent HPV   Discharge Diagnoses:  Concussion  DM Hyperlipidemia HTN Depression Peripheral neuropathy  Recurrent HPV   Discharged Condition: good  Hospital Course:  HPI: 61 yo female with multiple medical problems including blindness, peripheral neuropathy who was admitted from the clinic after having fallen outside after her appointment. She reports having tripped on an uneven step along the side walk. She fell face first, hitting the left side of her face. She also fell on her left arm and her tail bone. She was helped back into the clinic and about after the fall, she started having dizziness (lightheadedness and sense of room spinning) and was unable to stand due to the dizziness and uneasiness on her feet. She denies any nausea or vomiting after the fall, any change in her baseline vision. She does have occasional episodes of dizziness, but this one appeared a little worst than usual. She also reports a severe headache, localized to the left side of her face, around her eye.   The patient was admitted for observation. CT of her head was negative for acute bleed. Her headache improved during her hospitalization and was minimal at the time of discharge. However, her other symptoms such as weakness and unsteadiness persisted. PT/OT evaluation recommended short-term rehabilitation, and patient amenable to this plan.   She was discharged to SNF in stable medical condition.  Consults:PT/OT and SW  Significant Diagnostic Studies:  CT scan - Negative for bleed Wrist XR - Negative for fracture  Treatments: as per above  Disposition: 06-Home-Health Care Svc  Discharge Orders    Future Appointments: Provider: Department: Dept Phone: Center:     12/11/2011 10:45 AM Nestor Ramp, MD [facility] [phone] [facility]   01/30/2012 11:00 AM Waymon Budge, MD [facility] [phone] None     Medication List  As of 11/21/2011 12:58 PM   ASK your doctor about these medications         acyclovir 400 MG tablet   Commonly known as: ZOVIRAX   Take 800 mg by mouth 2 (two) times daily.      albuterol 108 (90 BASE) MCG/ACT inhaler   Commonly known as: PROVENTIL HFA;VENTOLIN HFA   Inhale 2 puffs into the lungs every 4 (four) hours as needed. For shortness of breath      albuterol (2.5 MG/3ML) 0.083% nebulizer solution   Commonly known as: PROVENTIL   Take 3 mLs (2.5 mg total) by nebulization every 6 (six) hours as needed for wheezing or shortness of breath.      allopurinol 300 MG tablet   Commonly known as: ZYLOPRIM   Take 300 mg by mouth daily.      arformoterol 15 MCG/2ML Nebu   Commonly known as: BROVANA   Take 15 mcg by nebulization 2 (two) times daily as needed. For shortness of breath      aspirin 325 MG tablet   Take 325 mg by mouth daily.      azelastine 137 MCG/SPRAY nasal spray   Commonly known as: ASTELIN   2 sprays by Nasal route daily as needed. Use in each nostril as directed      beclomethasone 80 MCG/ACT inhaler   Commonly known as: QVAR   Inhale 1 puff into the lungs as needed. For wheezing.  brimonidine 0.1 % Soln   Commonly known as: ALPHAGAN P   Place 1 drop into the left eye 2 (two) times daily.      docusate sodium 100 MG capsule   Commonly known as: COLACE   Take 1 capsule (100 mg total) by mouth 2 (two) times daily as needed for constipation.      esomeprazole 40 MG capsule   Commonly known as: NEXIUM   Take 40 mg by mouth 2 (two) times daily.      furosemide 20 MG tablet   Commonly known as: LASIX   Take 20 mg by mouth daily.      insulin glargine 100 UNIT/ML injection   Commonly known as: LANTUS   Inject 40 Units into the skin daily.      latanoprost 0.005 % ophthalmic solution    Commonly known as: XALATAN   Place 1 drop into both eyes at bedtime.      losartan-hydrochlorothiazide 100-25 MG per tablet   Commonly known as: HYZAAR   Take 1 tablet by mouth daily.      metFORMIN 1000 MG tablet   Commonly known as: GLUCOPHAGE   Take 1.5 tablets by mouth in the am and 1 tablets in the PM      metoprolol succinate 50 MG 24 hr tablet   Commonly known as: TOPROL-XL   Take 1 tablet (50 mg total) by mouth 2 (two) times daily.      montelukast 10 MG tablet   Commonly known as: SINGULAIR   Take 10 mg by mouth every morning.      nitroGLYCERIN 0.4 MG SL tablet   Commonly known as: NITROSTAT   Place 1 tablet (0.4 mg total) under the tongue every 5 (five) minutes as needed for chest pain.      polyethylene glycol packet   Commonly known as: MIRALAX / GLYCOLAX   Take 17 g by mouth daily as needed. For constipation      potassium chloride 40 MEQ/15ML (20%) Liqd   Take 15 ml ONCE dialy      pregabalin 100 MG capsule   Commonly known as: LYRICA   Take one by mouth in the morning and two at bedtime.      PRESCRIPTION MEDICATION   Inject 1 mL as directed every 14 (fourteen) days. Allergy Injection      sertraline 100 MG tablet   Commonly known as: ZOLOFT   Take 150 mg by mouth at bedtime.      simvastatin 40 MG tablet   Commonly known as: ZOCOR   Take 40 mg by mouth every morning.      traMADol 50 MG tablet   Commonly known as: ULTRAM   Take one or two tablets by mouth every 8 hours as needed for joint pain. Maximum of 6 tablets in 24 hour period.            Signed: Lavada Mesi 11/21/2011, 12:58 PM  Etta Quill. Madolyn Frieze, MD PGY-2

## 2011-11-21 NOTE — Progress Notes (Signed)
Placed pt. On CPAP auto titrate via nasal mask. Pt. Is tolerating CPAP well at this time.

## 2011-11-21 NOTE — Progress Notes (Signed)
Late Entry:  Clinical Social Work Department BRIEF PSYCHOSOCIAL ASSESSMENT 11/21/2011  Patient:  [name] [name], [name] [name]     Account Number:  [id number]     Admit date:  11/18/2011  Clinical Social Worker:  Lourdes Sledge  Date/Time:  11/21/2011 09:40 AM  Referred by:  Physician  Date Referred:  11/20/2011 Referred for  SNF Placement   Other Referral:   Interview type:  Patient Other interview type:    PSYCHOSOCIAL DATA Living Status:  WITH ADULT CHILDREN Admitted from facility:   Level of care:   Primary support name:  Sister-Cathy Symes Primary support relationship to patient:  SIBLING Degree of support available:   adequate    CURRENT CONCERNS  Other Concerns:    SOCIAL WORK ASSESSMENT / PLAN CSW initially informed pt would dc home. CSW later informed pt would need SNF placement at dc. CSW met with pt who is agreeable to placement near her home. Pt reports iving with her adult son who pt states is very helpful however works during the day. CSW will complete FL2 and fax pt out to facitilites in [facility]. CSW will also submit PT/OT notes for insurance authorization.   Assessment/plan status:  Other - See comment Other assessment/ plan:   discharge planning .   Information/referral to community resources:    PATIENT'S/FAMILY'S RESPONSE TO PLAN OF CARE: Pt was alert and oriented and pleasant to speak to. Pt states she is agreeable to ST rehab at dc.

## 2011-11-21 NOTE — Progress Notes (Signed)
Physical Therapy Treatment Patient Details Name: [name] [name] MRN: [id number] DOB: 11/28/1950 Today's Date: 11/21/2011  PT Assessment/Plan  PT - Assessment/Plan Comments on Treatment Session: Patient showed progress today with ambulation. Walked 100 feet, requiring 2 rest breaks. Reported mild dizziness at one time during ambulation which subsided quickly with standing rest break & with no other c/o dizziness rest of session. Pt. used RW today verses straight cane and reports she feels more stable.  Pt reports plans are for d/c to [facility] today.    PT Frequency: Min 3X/week Follow Up Recommendations: Supervision for mobility/OOB;Skilled nursing facility Equipment Recommended: Defer to next venue PT Goals  Acute Rehab PT Goals PT Goal Formulation: With patient Time For Goal Achievement: 2 weeks PT Goal: Sit to Stand - Progress: Not met PT Goal: Stand to Sit - Progress: Not met PT Goal: Ambulate - Progress: Progressing toward goal PT Goal: Perform Home Exercise Program - Progress: Progressing toward goal  PT Treatment Precautions/Restrictions  Precautions Precautions: Fall Restrictions Weight Bearing Restrictions: No Mobility (including Balance) Transfers Transfers: Yes Sit to Stand: 4: Min assist;From chair/3-in-1;With armrests;With upper extremity assist Sit to Stand Details (indicate cue type and reason): sit to stand x 2. Used RW today for increased stability, used straight cane previous session. Pt. states she sometimes uses rolator at home and in community. Cues for safe hand placement.  Stand to Sit: 4: Min assist;With upper extremity assist;With armrests;To chair/3-in-1 Stand to Sit Details: cues to reach back with both hands for armrest. Min (A) for controlled descent. Ambulation/Gait Ambulation/Gait: Yes Ambulation/Gait Assistance: 4: Min assist Ambulation/Gait Assistance Details (indicate cue type and reason): Patient stated she felt a little unsteady but felt more  steady today with RW verses straight cane. Ambulated ~100 feet with standing rest break after 30 feet and sitting rest break after the next 20 feet, them ambulated 50 feet back to room without break. Pt. reported mild dizziness with standing rest break that subsided and before proceeding with ambulation. Cues to increase step bilateral step length. Ambulation Distance (Feet): 100 Feet Assistive device: Rolling walker Gait Pattern: Step-through pattern;Decreased step length - right;Decreased step length - left;Decreased stride length  Exercise  General Exercises - Lower Extremity Ankle Circles/Pumps: AROM;Strengthening;Both;10 reps;Seated Hip ABduction/ADduction: AROM;Strengthening;Both;10 reps;Seated Straight Leg Raises: AROM;Strengthening;Both;10 reps;Seated Hip Flexion/Marching: AROM;Strengthening;Both;Seated (20 reps) End of Session PT - End of Session Equipment Utilized During Treatment: Gait belt Activity Tolerance: Patient limited by fatigue Patient left: in chair;with call bell in reach General Behavior During Session: [facility] for tasks performed Cognition: [facility] for tasks performed  Ardyth Gal SPTA 11/21/2011, 2:39 PM

## 2011-11-21 NOTE — Progress Notes (Signed)
Subjective: No acute events over night. Patient still feeling weak and unsteady on her feet. Patient also admits to occasionally feeling dizzy occasionally. No N/V. Good appetite. HA is feels better everyday since fall.  Objective: Vital signs in last 24 hours: Temp:  [97.4 F (36.3 C)-98.3 F (36.8 C)] 97.4 F (36.3 C) (03/21 0600) Pulse Rate:  [60-76] 76  (03/21 0851) Resp:  [16-20] 16  (03/21 0851) BP: (101-122)/(53-74) 122/74 mmHg (03/21 0600) SpO2:  [92 %-99 %] 98 % (03/21 0600) Weight change:  Last BM Date: 11/18/11  Intake/Output from previous day: 03/20 0701 - 03/21 0700 In: 1020 [P.O.:1020] Out: -  Intake/Output this shift:    General appearance: NAD Head: Normocephalic, patient with closed clean and dry lesion above left eye wih 2 stiches placed Resp: clear to auscultation bilaterally Cardio: RRR Ext: Wrist Splint placed on left wrist Neuro: CNs grossly intact, decreased strength in LUE (stable) otherwise 5/5 throughout, sensation is grossly intact  Lab Results:  [facility] 11/19/11 0610 11/18/11 2137  WBC 9.3 12.7*  HGB 11.9* 11.4*  HCT 37.1 36.0  PLT 211 221   BMET  Basename 11/19/11 0610 11/18/11 1915  NA 138 142  K 3.5 3.1*  CL 99 100  CO2 27 30  GLUCOSE 215* 155*  BUN 13 12  CREATININE 0.60 0.56  CALCIUM 9.8 9.7    Studies/Results: Dg Wrist Complete Left  11/19/2011  *RADIOLOGY REPORT*  Clinical Data: Pain post fall, rule out fracture  LEFT WRIST - COMPLETE 3+ VIEW  Comparison: None.  Findings: Four views of the left wrist submitted.  No acute fracture or subluxation.  Mild osteopenia.  Mild degenerative changes first carpal metacarpal joint.  IMPRESSION: No acute fracture or subluxation.  Mild osteopenia.  Original Report Authenticated By: Natasha Mead, M.D.    Medications: I have reviewed the patient's current medications.  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   1. Head Trauma  - CT showed no signs of intracranial bleed; neuro exam is stable  - Patient continues to complain of slowed mentation, gait imbalance and weakness since her fall.  - Patient agreeable to SNF placement - Pain control with naproxen and tylenol.  - cont q4hr neuro checks  - Repeat CT if neuro exam changes   2. Left Wrist Trauma after landing on that hand after her fall. X-ray shows no fracture.  - in brace with minimal pain   3. DM:  - Continue lantus 30units and start sliding scale. Hold metformin.  - BS between 160-200   3. Hyperlipidemia: continue home simvastatin   4. Hypertension:  -Continue home meds:  -metoprolol 50mg  bid  -losartan/hctz 100-25  -Lasix 20mg 5.   5. Depression: continue sertraline 150mg  daily   6. Peripheral neuropathy: continue pregabalin 100mg  daily   7. Recurrent HPV: continue home acyclovir   8. Obstructive Sleep apnea: continue CPAP qhs  9. Fen/Gi:  - carb modified diet   10. PPx: SCD's  11. Dispo: Medical problems stable. Patient agreeable to placement in SNF for rehabilitation. Arrangements are being made via SW for placement. Will continue to monitor for changes.   LOS: 3 days   [name] [name] 11/21/2011, 9:14 AM   I have seen and examined the patient and agree with MS-4's assessment and plan.  [name] [name] is stable following likely concussion from her fall, however, still feels unbalanced and PT recommending SNF and patient amenable  to this plan. Anticipate discharge to [facility]) for short-term rehabilitation today.   Etta Quill. Madolyn Frieze, MD PGY-2

## 2011-11-21 NOTE — Progress Notes (Signed)
  Subjective:    Patient ID: [name] [name], female    DOB: 1951-06-03, 61 y.o.   MRN: [id number]  HPIShe says her recent fall was due to tripping at a curb, but she has had two other falls this year. Recently she does feel more unsteady on her feet. Her left supraorbital laceration is healing and not minimally tender.   She's been blind since birth due to cataracts which were removed at age 38. 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   She lives with her son who has ADHD/Opositional Defient disorder, but is working daily   Review of Systems     Objective:   Physical ExamAlert, pleasantly cooperative.  Both irises are small. right cornea is cloudy. left pupil is superiorly eccentric, but reactive. Fundus can be partially visualized and has some areas of dark pigmentation. Appears to be aphakic. Chest clear Cor RR without murmur.  Abdomen obese, soft, non-tender Ext Nl ROM Neuro subjective decreased sensation great toes and left 2nd and 3rd toes. Position sense poor on right, but accurate on left Romberg mildly unsteady. Gait wide based with very small steps (patient blind, but holding my arm)         Assessment & Plan:

## 2011-11-21 NOTE — Progress Notes (Addendum)
CSW spoke with pt who has confirmed she would like placement at [facility]. CSW has submitted pt clinicals to pt insurance and will await authorization from insurance.  CSW to facilitate with dc when authorization received. Theresia Bough, MSW, Theresia Majors [phone]   CSW has received ambulance authorization [id number] for pt.

## 2011-11-21 NOTE — Assessment & Plan Note (Signed)
Peripheral neuropathy.  A1c was 6.9 on Oct 02, 2011

## 2011-11-22 ENCOUNTER — Encounter: Payer: Self-pay | Admitting: Family Medicine

## 2011-11-22 ENCOUNTER — Non-Acute Institutional Stay: Payer: Self-pay | Admitting: Family Medicine

## 2011-11-22 MED ORDER — PREGABALIN 100 MG PO CAPS: ORAL_CAPSULE | ORAL | Status: DC

## 2011-11-22 NOTE — Progress Notes (Signed)
[facility] Admission History and Physical  Patient name: [name] [name] Medical record number: [id number] Date of birth: October 26, 1950 Age: 61 y.o. Gender: female  Primary Care Provider: Denny Levy, MD, MD  Chief Complaint: unsteadiness on feet. Hip pain, wrist pain,   History of Present Illness: 61 yo female with multiple medical problems including legally blind since birth, peripheral neuropathy who was admitted from the clinic after having fallen outside after her appointment. She reports having tripped on an uneven step along the side walk. She fell face first, hitting the left side of her face. She also fell on her left arm and her tail bone. She was helped back into the clinic and about after the fall, she started having dizziness (lightheadedness and sense of room spinning) and was unable to stand due to the dizziness and uneasiness on her feet. She denies any nausea or vomiting after the fall, any change in her baseline vision. She does have occasional episodes of dizziness, but this one appeared a little worst than usual. She also reports a severe headache, localized to the left side of her face, around her eye.  The patient was admitted for observation. CT of her head was negative for acute bleed. Her headache improved during her hospitalization and was minimal at the time of discharge. However, her other symptoms such as weakness and unsteadiness persisted. PT/OT evaluation recommended short-term rehabilitation, and patient amenable to this plan.  She was discharged to [facility] where she has started her therapy. She has no complaints today.   Significant Diagnostic Studies:  CT scan - Negative for bleed  Wrist XR - Negative for fracture  Patient Active Problem List  Diagnoses  . HERPES SIMPLEX INFECTION, RECURRENT  . DIABETES MELLITUS II, UNCOMPLICATED  . HYPERLIPIDEMIA-MIXED  . DEPRESSION, MAJOR, RECURRENT  . OBSTRUCTIVE SLEEP APNEA  . CARPAL TUNNEL SYNDROME,  BILATERAL  . PERIPHERAL NEUROPATHY  . GLAUCOMA  . NEURAL HEARING LOSS BILATERAL  . HYPERTENSION, BENIGN SYSTEMIC  . PARALYSIS, VOCAL CORD, UNILATERAL, TOTAL  . GERD  . IRRITABLE BOWEL SYNDROME  . GAIT IMBALANCE  . CATARACT EXTRACTION STATUS  . DEGENERATIVE JOINT DISEASE, KNEE  . Thoracolumbar back pain  . Allergic rhinitis due to pollen  . Hip pain, bilateral  . Knee pain, bilateral  . Hip arthritis  . Nail dystrophy   Past Medical History: Past Medical History  Diagnosis Date  . Chronic cough     Now followed by pulmonary  . Sleep apnea     on CPAP 12  . HTN (hypertension)   . Dyslipidemia   . Gout   . History of colonoscopy   . Concussion 11/18/11    "fell at dr's office; hit head"  . Complication of anesthesia     "just wake up coughing; that's all"  . Heart murmur   . Asthma   . COPD (chronic obstructive pulmonary disease)   . Pneumonia     "i've had it once" (11/18/11)  . Diabetes mellitus type II     "take insulin & pills"  . Blood transfusion   . GERD (gastroesophageal reflux disease)   . Headache 11/18/11    "I've had mild headaches the last couple days"  . Arthritis   . Chronic lower back pain   . Vocal cord paralysis, unilateral partial     "right"  . Depression     Past Surgical History: Past Surgical History  Procedure Date  . Colonoscopy w/ biopsies 11/21/2010    adenoma  polyp--no hi grade dysplasia Dr [address]  . Treatment fistula anal   . Breast reduction surgery 04-21-2003  . Tubal ligation 1987  . Cardiac catheterization 03-02-2004  . Bronchoscopy 07-2007  . Carpal tunnel release 2011    left  . Knee arthroscopy 08/2000    right  . Shoulder arthroscopy 08/2000    right  . Naso-pharyn scope 09/11/06    ENT, normal  . T-score 09/02/04    -0.54 (low risk currently)  . Total abdominal hysterectomy 10-07-2000  . Cesarean section 1987  . Shoulder arthroscopy w/ rotator cuff repair 10/18/2003    left  . Breast surgery   . Cataract extraction 1955;  1979    bilateral; right eye  . Eye surgery     Social History: History   Social History  . Marital Status: Single    Spouse Name: N/A    Number of Children: 1  . Years of Education: N/A   Occupational History  . unemployed    Social History Main Topics  . Smoking status: Former Smoker -- 0.5 packs/day for .5 years    Types: Cigarettes    Quit date: 09/03/1975  . Smokeless tobacco: Never Used  . Alcohol Use: No  . Drug Use: No  . Sexually Active: Not Currently   Other Topics Concern  . None   Social History Narrative   Lives alone.Approved for section 8 housing (12/03).Has a son who may be autistic and stays with her at times.    Family History: Family History  Problem Relation Age of Onset  . Diabetes Mother   . Colon cancer Neg Hx     Allergies: Allergies  Allergen Reactions  . Amlodipine Besylate Swelling  . Lisinopril-Hydrochlorothiazide Cough    REACTION: cough-- changed to arb  . Propoxyphene Hcl Itching  . Valsartan     REACTION:  "sleep more the next day after I took it; it made me real tired"  . Metronidazole     REACTION: "just didn't work; my body never did heal from it"    Current Outpatient Prescriptions  Medication Sig Dispense Refill  . acetaminophen (TYLENOL) 325 MG tablet Take 2 tablets (650 mg total) by mouth every 6 (six) hours as needed (or Fever >/= 101).  90 tablet  0  . acyclovir (ZOVIRAX) 400 MG tablet Take 800 mg by mouth 2 (two) times daily.        [name] albuterol (PROVENTIL HFA;VENTOLIN HFA) 108 (90 BASE) MCG/ACT inhaler Inhale 2 puffs into the lungs every 4 (four) hours as needed. For shortness of breath      . albuterol (PROVENTIL) (2.5 MG/3ML) 0.083% nebulizer solution Take 3 mLs (2.5 mg total) by nebulization every 6 (six) hours as needed for wheezing or shortness of breath.  75 mL  12  . allopurinol (ZYLOPRIM) 300 MG tablet Take 300 mg by mouth daily.        [name] arformoterol (BROVANA) 15 MCG/2ML NEBU Take 15 mcg by nebulization 2  (two) times daily as needed. For shortness of breath      . aspirin 81 MG tablet Take 4 tablets (325 mg total) by mouth daily.  30 tablet  3  . azelastine (ASTELIN) 137 MCG/SPRAY nasal spray 2 sprays by Nasal route daily as needed. Use in each nostril as directed       . beclomethasone (QVAR) 80 MCG/ACT inhaler Inhale 1 puff into the lungs as needed. For wheezing.      . brimonidine (ALPHAGAN P) 0.1 %  SOLN Place 1 drop into the left eye 2 (two) times daily.        [name] docusate sodium (COLACE) 100 MG capsule Take 1 capsule (100 mg total) by mouth 2 (two) times daily as needed for constipation.  180 capsule  1  . esomeprazole (NEXIUM) 40 MG capsule Take 40 mg by mouth 2 (two) times daily.      . furosemide (LASIX) 20 MG tablet Take 20 mg by mouth daily.        . insulin glargine (LANTUS) 100 UNIT/ML injection Inject 40 Units into the skin daily.      [name] latanoprost (XALATAN) 0.005 % ophthalmic solution Place 1 drop into both eyes at bedtime.        [name] losartan-hydrochlorothiazide (HYZAAR) 100-25 MG per tablet Take 1 tablet by mouth daily.        . metFORMIN (GLUCOPHAGE) 1000 MG tablet Take 1.5 tablets by mouth in the am and 1 tablets in the PM       . metoprolol (TOPROL-XL) 50 MG 24 hr tablet Take 1 tablet (50 mg total) by mouth 2 (two) times daily.  60 tablet  6  . montelukast (SINGULAIR) 10 MG tablet Take 10 mg by mouth every morning.      . naproxen (NAPROSYN) 500 MG tablet Take 1 tablet (500 mg total) by mouth 2 (two) times daily as needed. With a meal.  60 tablet  1  . nitroGLYCERIN (NITROSTAT) 0.4 MG SL tablet Place 1 tablet (0.4 mg total) under the tongue every 5 (five) minutes as needed for chest pain.  100 tablet  3  . polyethylene glycol (MIRALAX / GLYCOLAX) packet Take 17 g by mouth daily as needed. For constipation      . potassium chloride 40 MEQ/15ML (20%) LIQD Take 15 ml ONCE dialy      . pregabalin (LYRICA) 100 MG capsule Take one by mouth in the morning and two at bedtime.  90 capsule   12  . PRESCRIPTION MEDICATION Inject 1 mL as directed every 14 (fourteen) days. Allergy Injection      . sertraline (ZOLOFT) 100 MG tablet Take 150 mg by mouth at bedtime.      . simvastatin (ZOCOR) 40 MG tablet Take 40 mg by mouth every morning.      . traMADol (ULTRAM) 50 MG tablet Take one or two tablets by mouth every 8 hours as needed for joint pain. Maximum of 6 tablets in 24 hour period.  180 tablet  5   No current facility-administered medications for this visit.   Facility-Administered Medications Ordered in Other Visits  Medication Dose Route Frequency Provider Last Rate Last Dose  . DISCONTD: acetaminophen (TYLENOL) suppository 650 mg  650 mg Rectal Q6H PRN Lonia Skinner, MD      . DISCONTD: acetaminophen (TYLENOL) tablet 650 mg  650 mg Oral Q6H PRN Lonia Skinner, MD   650 mg at 11/20/11 2037  . DISCONTD: acyclovir (ZOVIRAX) tablet 800 mg  800 mg Oral BID Lonia Skinner, MD   800 mg at 11/21/11 4098  . DISCONTD: albuterol (PROVENTIL HFA;VENTOLIN HFA) 108 (90 BASE) MCG/ACT inhaler 2 puff  2 puff Inhalation Q4H PRN Lonia Skinner, MD      . DISCONTD: albuterol (PROVENTIL) (5 MG/ML) 0.5% nebulizer solution 2.5 mg  2.5 mg Nebulization Q6H PRN Lonia Skinner, MD      . DISCONTD: allopurinol (ZYLOPRIM) tablet 300 mg  300 mg Oral Daily Lonia Skinner, MD  300 mg at 11/21/11 0927  . DISCONTD: azelastine (ASTELIN) nasal spray 2 spray  2 spray Each Nare Daily PRN Lonia Skinner, MD      . DISCONTD: brimonidine (ALPHAGAN) 0.2 % ophthalmic solution 1 drop  1 drop Left Eye BID Carney Living, MD   1 drop at 11/21/11 0927  . DISCONTD: docusate sodium (COLACE) capsule 100 mg  100 mg Oral BID PRN Lonia Skinner, MD      . DISCONTD: fluticasone (FLOVENT HFA) 44 MCG/ACT inhaler 1 puff  1 puff Inhalation BID Lonia Skinner, MD   1 puff at 11/21/11 0850  . DISCONTD: furosemide (LASIX) tablet 20 mg  20 mg Oral Daily Lonia Skinner, MD   20 mg at 11/21/11 4098  . DISCONTD:  hydrochlorothiazide (HYDRODIURIL) tablet 25 mg  25 mg Oral Daily Carney Living, MD   25 mg at 11/21/11 0927  . DISCONTD: insulin aspart (novoLOG) injection 0-15 Units  0-15 Units Subcutaneous TID WC Lonia Skinner, MD   2 Units at 11/21/11 [phone]  . DISCONTD: insulin glargine (LANTUS) injection 30 Units  30 Units Subcutaneous Daily Lonia Skinner, MD   30 Units at 11/21/11 0933  . DISCONTD: latanoprost (XALATAN) 0.005 % ophthalmic solution 1 drop  1 drop Both Eyes QHS Lonia Skinner, MD   1 drop at 11/20/11 2213  . DISCONTD: losartan (COZAAR) tablet 100 mg  100 mg Oral Daily Carney Living, MD   100 mg at 11/21/11 0927  . DISCONTD: metoprolol succinate (TOPROL-XL) 24 hr tablet 50 mg  50 mg Oral BID Lonia Skinner, MD   50 mg at 11/21/11 4782  . DISCONTD: montelukast (SINGULAIR) tablet 10 mg  10 mg Oral QHS Lonia Skinner, MD   10 mg at 11/20/11 2215  . DISCONTD: naproxen (NAPROSYN) tablet 500 mg  500 mg Oral BID WC Lonia Skinner, MD   500 mg at 11/21/11 0806  . DISCONTD: ondansetron (ZOFRAN) injection 4 mg  4 mg Intravenous Q6H PRN Lonia Skinner, MD      . DISCONTD: ondansetron (ZOFRAN) tablet 4 mg  4 mg Oral Q6H PRN Lonia Skinner, MD      . DISCONTD: pregabalin (LYRICA) capsule 100 mg  100 mg Oral Daily Lonia Skinner, MD   100 mg at 11/21/11 0927  . DISCONTD: sertraline (ZOLOFT) 150 mg  150 mg Oral Daily Lonia Skinner, MD   150 mg at 11/21/11 0926  . DISCONTD: simvastatin (ZOCOR) tablet 40 mg  40 mg Oral QPM Lonia Skinner, MD   40 mg at 11/20/11 1800  . DISCONTD: traMADol (ULTRAM) tablet 50 mg  50 mg Oral Q8H PRN Lonia Skinner, MD   50 mg at 11/20/11 1446   Review Of Systems: Per HPI Otherwise 12 point review of systems was performed and was unremarkable.  Physical Exam: Filed Vitals:   11/22/11 1543  BP: 139/71  Pulse: 68  Temp: 97.4 F (36.3 C)  Resp: 20  SpO2: 97%    General: alert, cooperative and AAF, blind HEENT: sclera clear, anicteric,  oropharynx clear, no lesions and neck supple with midline trachea Heart: S1, S2 normal, no murmur, rub or gallop, regular rate and rhythm Lungs: clear to auscultation, no wheezes or rales and unlabored breathing Abdomen: abdomen is soft without significant tenderness, masses, organomegaly or guarding Extremities: extremities normal, atraumatic, no cyanosis or edema TTP of left wrist. TTP of left hip.  Skin:no ecchymoses, no petechiae  Neurology: normal without focal findings and mental status, speech normal, alert and oriented x3  Labs and Imaging: Lab Results  Component Value Date/Time   NA 138 11/19/2011  6:10 AM   K 3.5 11/19/2011  6:10 AM   CL 99 11/19/2011  6:10 AM   CO2 27 11/19/2011  6:10 AM   BUN 13 11/19/2011  6:10 AM   CREATININE 0.60 11/19/2011  6:10 AM   CREATININE 0.64 07/03/2011 11:06 AM   GLUCOSE 215* 11/19/2011  6:10 AM   Lab Results  Component Value Date   WBC 9.3 11/19/2011   HGB 11.9* 11/19/2011   HCT 37.1 11/19/2011   MCV 85.3 11/19/2011   PLT 211 11/19/2011   Assessment and Plan: [name] [name] is a 61 y.o. year old female presenting with unsteadiness and weakness for short term rehab.   Nursing Home Admission Checklist:  Is pt in EPIC?yes (if not, bring insurance info to [facility] for admin team to add pt to enter into system) Medications reviewed?yes Pt room number entered into nursing home section?yes Have advanced directives been addressed?no Does DNR need to be signed?no Does pertinent family need to be contacted?no Calculate functional assessment (?PT) if pt is planned on going back home. Is pt a longterm nursing home candidate?no If pt short term rehab, obtain PCP info. Are vital signs entered?yes Past History Reviewed?yes Route note to Dr. Sheffield Slider. Bill as Bynum Bellows Pertinent info entered into [employer] List?yes

## 2011-11-23 NOTE — Discharge Summary (Signed)
Family Medicine Teaching Service  Discharge Note : Attending Charda Janis MD Pager [phone] Office [phone] I have seen and examined this patient, reviewed their chart and discussed discharge planning wit the resident at the time of discharge. I agree with the discharge plan as above.  

## 2011-11-25 ENCOUNTER — Encounter: Payer: Self-pay | Admitting: Pharmacist

## 2011-12-02 ENCOUNTER — Non-Acute Institutional Stay: Payer: Self-pay | Admitting: Family Medicine

## 2011-12-02 ENCOUNTER — Telehealth: Payer: Self-pay | Admitting: Family Medicine

## 2011-12-02 MED ORDER — SERTRALINE HCL 100 MG PO TABS: 150.0000 mg | ORAL_TABLET | Freq: Every day | ORAL | Status: DC

## 2011-12-02 MED ORDER — LOSARTAN POTASSIUM-HCTZ 100-25 MG PO TABS: 1.0000 | ORAL_TABLET | Freq: Every day | ORAL | Status: DC

## 2011-12-02 MED ORDER — LATANOPROST 0.005 % OP SOLN: 1.0000 [drp] | Freq: Every day | OPHTHALMIC | Status: DC

## 2011-12-02 MED ORDER — SIMVASTATIN 40 MG PO TABS: 40.0000 mg | ORAL_TABLET | Freq: Every morning | ORAL | Status: DC

## 2011-12-02 MED ORDER — MONTELUKAST SODIUM 10 MG PO TABS: 10.0000 mg | ORAL_TABLET | Freq: Every day | ORAL | Status: DC

## 2011-12-02 MED ORDER — ALLOPURINOL 300 MG PO TABS: 300.0000 mg | ORAL_TABLET | Freq: Every day | ORAL | Status: DC

## 2011-12-02 MED ORDER — NITROGLYCERIN 0.4 MG SL SUBL: 0.4000 mg | SUBLINGUAL_TABLET | SUBLINGUAL | Status: DC | PRN

## 2011-12-02 MED ORDER — POLYETHYLENE GLYCOL 3350 17 G PO PACK: 17.0000 g | PACK | Freq: Every day | ORAL | Status: DC | PRN

## 2011-12-02 MED ORDER — TRAMADOL HCL 50 MG PO TABS: ORAL_TABLET | ORAL | Status: DC

## 2011-12-02 MED ORDER — AZELASTINE HCL 0.1 % NA SOLN: 2.0000 | Freq: Every day | NASAL | Status: DC | PRN

## 2011-12-02 MED ORDER — ASPIRIN 81 MG PO TABS: 325.0000 mg | ORAL_TABLET | Freq: Every day | ORAL | Status: DC

## 2011-12-02 MED ORDER — FUROSEMIDE 20 MG PO TABS: 20.0000 mg | ORAL_TABLET | Freq: Every day | ORAL | Status: DC

## 2011-12-02 MED ORDER — BECLOMETHASONE DIPROPIONATE 80 MCG/ACT IN AERS: 1.0000 | INHALATION_SPRAY | RESPIRATORY_TRACT | Status: DC | PRN

## 2011-12-02 MED ORDER — METOPROLOL SUCCINATE ER 50 MG PO TB24: 50.0000 mg | ORAL_TABLET | Freq: Two times a day (BID) | ORAL | Status: DC

## 2011-12-02 MED ORDER — ESOMEPRAZOLE MAGNESIUM 40 MG PO CPDR: 40.0000 mg | DELAYED_RELEASE_CAPSULE | Freq: Two times a day (BID) | ORAL | Status: DC

## 2011-12-02 MED ORDER — DOCUSATE SODIUM 100 MG PO CAPS: 100.0000 mg | ORAL_CAPSULE | Freq: Two times a day (BID) | ORAL | Status: DC | PRN

## 2011-12-02 MED ORDER — METFORMIN HCL 1000 MG PO TABS: 1000.0000 mg | ORAL_TABLET | Freq: Every day | ORAL | Status: DC

## 2011-12-02 MED ORDER — PREGABALIN 100 MG PO CAPS: ORAL_CAPSULE | ORAL | Status: DC

## 2011-12-02 NOTE — Telephone Encounter (Signed)
I put one month refills for almost all of her medications listed. She was discharged home today. She was smiling and appreciative.

## 2011-12-03 ENCOUNTER — Other Ambulatory Visit: Payer: Self-pay | Admitting: Family Medicine

## 2011-12-03 MED ORDER — METFORMIN HCL 1000 MG PO TABS: 1000.0000 mg | ORAL_TABLET | Freq: Every day | ORAL | Status: DC

## 2011-12-03 NOTE — Telephone Encounter (Signed)
Dear [place] Asters Team plz ask her if she needs [place] to cover the $30 exam fee? She will understand  THANKS! Denny Levy

## 2011-12-03 NOTE — Telephone Encounter (Signed)
[name] [name] called to say she have an appt on Friday with the eye doctor for a refraction.  She will have to pay $30 for this and will let you know how much it will cost when she find out.

## 2011-12-03 NOTE — Telephone Encounter (Signed)
Spoke with patient and she said yes. And also said to say HELLO to you

## 2011-12-04 ENCOUNTER — Ambulatory Visit (INDEPENDENT_AMBULATORY_CARE_PROVIDER_SITE_OTHER): Payer: Medicare Other

## 2011-12-04 DIAGNOSIS — J309 Allergic rhinitis, unspecified: Secondary | ICD-10-CM

## 2011-12-05 ENCOUNTER — Telehealth: Payer: Self-pay | Admitting: Family Medicine

## 2011-12-05 NOTE — Telephone Encounter (Signed)
Pt called and informed of information that Dr. Jennette Kettle asked to be relayed to her. Pt voiced understanding and agreed.[name] [name]

## 2011-12-05 NOTE — Telephone Encounter (Signed)
Dear [name] [name] Team Please tell her I have placed the payment for her exam fee for her doctors office ([facility]) in an envelope at the front desk. Once she finds out how much the glasses are, she needs to call and let either me or [name] [name] (SW) know the exact amount of the eyeglasses THANKS! [name] [name]

## 2011-12-11 ENCOUNTER — Encounter: Payer: Self-pay | Admitting: Family Medicine

## 2011-12-11 ENCOUNTER — Ambulatory Visit (INDEPENDENT_AMBULATORY_CARE_PROVIDER_SITE_OTHER): Payer: Medicare Other | Admitting: Family Medicine

## 2011-12-11 VITALS — BP 120/73 | HR 93 | Ht 60.0 in | Wt 180.0 lb

## 2011-12-11 DIAGNOSIS — G609 Hereditary and idiopathic neuropathy, unspecified: Secondary | ICD-10-CM

## 2011-12-11 DIAGNOSIS — E119 Type 2 diabetes mellitus without complications: Secondary | ICD-10-CM

## 2011-12-11 DIAGNOSIS — F0781 Postconcussional syndrome: Secondary | ICD-10-CM

## 2011-12-11 LAB — POCT GLYCOSYLATED HEMOGLOBIN (HGB A1C): Hemoglobin A1C: 6.8

## 2011-12-12 ENCOUNTER — Encounter: Payer: Self-pay | Admitting: Family Medicine

## 2011-12-13 NOTE — Progress Notes (Signed)
  Subjective:    Patient ID: [name] [name], female    DOB: 01/09/1951, 61 y.o.   MRN: [id number]  HPI  Followup recent hospitalization for fall with facial contusion and laceration as well as concussion. She was in short-term rehabilitation for 2 weeks or so. And returned home. She feels about 90% back to her baseline. Still very slightly more fatigue with daytime activities than usual. She is still a little unstable on her feet but is using her new walker. She has an appointment to get her eyes checked so she can get her new glasses. #2 prediabetes mellitus. No low blood sugars. She is using a little bit of a sliding scale for her insulin. She is trying to follow a good diet most of the time but says she slips occasionally. #3 patient continues to have bilateral hip pains which she had prior to the fall. Her back pain which was exacerbated by the fall is improving. She still has a little bit of pain when she tries to do some rotation. She has a scheduled followup appointment with the orthopedist who did an MRI. We have not yet gotten the results of her MRI.  Review of Systems Denies unusual weight change. Denies fever, sweats, chills. Continues to have intermittent headaches pretty much on a daily basis but they are improving in their severity and length.    Objective:   Physical Exam  GENERAL: Well-developed female in no acute distress. HEENT. Legally blind. Resting nystagmus. Conjunctivae nonicteric. CARDIOVASCULAR: Regular rate and rhythm no murmur gallop or rub LUNGS: Clear to auscultation bilaterally MUSCULOSKELETAL: Significant decrease in fine motor control bilateral hands probably related to decreased sensation in stocking glove pattern. She is walking with a walker and doing quite well with that. She rises from a chair without assistance. NEURO: Cognitively she seems essentially back to her baseline including her sense of humor.      Assessment & Plan:  #1 concussion. Improving in  her symptoms. She is 90% or so back to her baseline #2. Diabetes mellitus. A1c looks great today I will continue to dose her insulin the way she has been doing. #3. Multiple areas of arthralgias with known severe osteoarthritis bilateral hips, bilateral knees. #4. Severe peripheral neuropathy multiple etiologies. I'll see her back in 4-6 weeks.

## 2011-12-26 ENCOUNTER — Ambulatory Visit (INDEPENDENT_AMBULATORY_CARE_PROVIDER_SITE_OTHER): Payer: Medicare Other

## 2011-12-26 DIAGNOSIS — J309 Allergic rhinitis, unspecified: Secondary | ICD-10-CM

## 2012-01-01 ENCOUNTER — Telehealth: Payer: Self-pay | Admitting: Clinical

## 2012-01-01 NOTE — Telephone Encounter (Signed)
CSW spoke with pt and confirmed pt will be picking up her eye glasses this week. Pt has no further CSW concerns and is appreciative of [facility] staff support.  Theresia Bough, MSW, Theresia Majors [phone]

## 2012-01-08 ENCOUNTER — Ambulatory Visit (INDEPENDENT_AMBULATORY_CARE_PROVIDER_SITE_OTHER): Payer: Medicare Other | Admitting: Family Medicine

## 2012-01-08 ENCOUNTER — Observation Stay (HOSPITAL_COMMUNITY): Payer: Medicare Other

## 2012-01-08 ENCOUNTER — Encounter (HOSPITAL_COMMUNITY): Payer: Self-pay | Admitting: Family Medicine

## 2012-01-08 ENCOUNTER — Inpatient Hospital Stay (HOSPITAL_COMMUNITY)
Admission: AD | Admit: 2012-01-08 | Discharge: 2012-01-21 | DRG: 473 | Disposition: A | Discharge status: Skilled Nursing Facility | Payer: Medicare Other | Source: Ambulatory Visit | Attending: Family Medicine | Admitting: Family Medicine

## 2012-01-08 ENCOUNTER — Encounter: Payer: Self-pay | Admitting: Family Medicine

## 2012-01-08 VITALS — BP 148/76 | HR 78 | Temp 97.3°F | Wt 181.0 lb

## 2012-01-08 DIAGNOSIS — K219 Gastro-esophageal reflux disease without esophagitis: Secondary | ICD-10-CM | POA: Diagnosis present

## 2012-01-08 DIAGNOSIS — E1149 Type 2 diabetes mellitus with other diabetic neurological complication: Secondary | ICD-10-CM | POA: Diagnosis present

## 2012-01-08 DIAGNOSIS — F329 Major depressive disorder, single episode, unspecified: Secondary | ICD-10-CM | POA: Diagnosis present

## 2012-01-08 DIAGNOSIS — E119 Type 2 diabetes mellitus without complications: Secondary | ICD-10-CM

## 2012-01-08 DIAGNOSIS — E1169 Type 2 diabetes mellitus with other specified complication: Secondary | ICD-10-CM | POA: Diagnosis present

## 2012-01-08 DIAGNOSIS — I10 Essential (primary) hypertension: Secondary | ICD-10-CM | POA: Diagnosis present

## 2012-01-08 DIAGNOSIS — A6000 Herpesviral infection of urogenital system, unspecified: Secondary | ICD-10-CM | POA: Diagnosis present

## 2012-01-08 DIAGNOSIS — R51 Headache: Secondary | ICD-10-CM

## 2012-01-08 DIAGNOSIS — J45909 Unspecified asthma, uncomplicated: Secondary | ICD-10-CM | POA: Diagnosis present

## 2012-01-08 DIAGNOSIS — Z7982 Long term (current) use of aspirin: Secondary | ICD-10-CM

## 2012-01-08 DIAGNOSIS — H409 Unspecified glaucoma: Secondary | ICD-10-CM | POA: Diagnosis present

## 2012-01-08 DIAGNOSIS — Z888 Allergy status to other drugs, medicaments and biological substances status: Secondary | ICD-10-CM

## 2012-01-08 DIAGNOSIS — M129 Arthropathy, unspecified: Secondary | ICD-10-CM | POA: Diagnosis present

## 2012-01-08 DIAGNOSIS — F3289 Other specified depressive episodes: Secondary | ICD-10-CM | POA: Diagnosis present

## 2012-01-08 DIAGNOSIS — E785 Hyperlipidemia, unspecified: Secondary | ICD-10-CM | POA: Diagnosis present

## 2012-01-08 DIAGNOSIS — R55 Syncope and collapse: Secondary | ICD-10-CM

## 2012-01-08 DIAGNOSIS — F0781 Postconcussional syndrome: Secondary | ICD-10-CM | POA: Diagnosis present

## 2012-01-08 DIAGNOSIS — Z79899 Other long term (current) drug therapy: Secondary | ICD-10-CM

## 2012-01-08 DIAGNOSIS — M4712 Other spondylosis with myelopathy, cervical region: Principal | ICD-10-CM | POA: Diagnosis present

## 2012-01-08 DIAGNOSIS — K589 Irritable bowel syndrome without diarrhea: Secondary | ICD-10-CM | POA: Diagnosis present

## 2012-01-08 DIAGNOSIS — Z794 Long term (current) use of insulin: Secondary | ICD-10-CM

## 2012-01-08 DIAGNOSIS — G4733 Obstructive sleep apnea (adult) (pediatric): Secondary | ICD-10-CM | POA: Diagnosis present

## 2012-01-08 DIAGNOSIS — G609 Hereditary and idiopathic neuropathy, unspecified: Secondary | ICD-10-CM | POA: Diagnosis present

## 2012-01-08 DIAGNOSIS — Z87891 Personal history of nicotine dependence: Secondary | ICD-10-CM

## 2012-01-08 DIAGNOSIS — Z9849 Cataract extraction status, unspecified eye: Secondary | ICD-10-CM

## 2012-01-08 DIAGNOSIS — M5000 Cervical disc disorder with myelopathy, unspecified cervical region: Secondary | ICD-10-CM

## 2012-01-08 HISTORY — DX: Angina pectoris, unspecified: I20.9

## 2012-01-08 HISTORY — DX: Polyneuropathy, unspecified: G62.9

## 2012-01-08 HISTORY — DX: Other forms of dyspnea: R06.09

## 2012-01-08 HISTORY — DX: Dyspnea, unspecified: R06.00

## 2012-01-08 LAB — CBC
HCT: 33.5 % — ABNORMAL LOW (ref 36.0–46.0)
Hemoglobin: 10.6 g/dL — ABNORMAL LOW (ref 12.0–15.0)
MCH: 27.7 pg (ref 26.0–34.0)
MCHC: 31.6 g/dL (ref 30.0–36.0)
MCV: 87.5 fL (ref 78.0–100.0)
Platelets: 162 10*3/uL (ref 150–400)
RBC: 3.83 MIL/uL — ABNORMAL LOW (ref 3.87–5.11)
RDW: 14.8 % (ref 11.5–15.5)
WBC: 9.4 10*3/uL (ref 4.0–10.5)

## 2012-01-08 LAB — COMPREHENSIVE METABOLIC PANEL
ALT: 18 U/L (ref 0–35)
AST: 19 U/L (ref 0–37)
Albumin: 3.1 g/dL — ABNORMAL LOW (ref 3.5–5.2)
Alkaline Phosphatase: 95 U/L (ref 39–117)
BUN: 17 mg/dL (ref 6–23)
CO2: 29 mEq/L (ref 19–32)
Calcium: 9.5 mg/dL (ref 8.4–10.5)
Chloride: 106 mEq/L (ref 96–112)
Creatinine, Ser: 0.66 mg/dL (ref 0.50–1.10)
GFR calc Af Amer: >90 mL/min (ref >90)
GFR calc non Af Amer: >90 mL/min (ref >90)
Glucose, Bld: 136 mg/dL — ABNORMAL HIGH (ref 70–99)
Potassium: 3.9 mEq/L (ref 3.5–5.1)
Sodium: 143 mEq/L (ref 135–145)
Total Bilirubin: 0.1 mg/dL — ABNORMAL LOW (ref 0.3–1.2)
Total Protein: 6.2 g/dL (ref 6.0–8.3)

## 2012-01-08 LAB — DIFFERENTIAL
Basophils Absolute: 0 10*3/uL (ref 0.0–0.1)
Basophils Relative: 0 % (ref 0–1)
Eosinophils Absolute: 0.1 10*3/uL (ref 0.0–0.7)
Eosinophils Relative: 1 % (ref 0–5)
Lymphocytes Relative: 41 % (ref 12–46)
Lymphs Abs: 3.8 10*3/uL (ref 0.7–4.0)
Monocytes Absolute: 0.5 10*3/uL (ref 0.1–1.0)
Monocytes Relative: 5 % (ref 3–12)
Neutro Abs: 5 10*3/uL (ref 1.7–7.7)
Neutrophils Relative %: 53 % (ref 43–77)

## 2012-01-08 LAB — GLUCOSE, CAPILLARY
Glucose-Capillary: 197 mg/dL — ABNORMAL HIGH (ref 70–99)
Glucose-Capillary: 95 mg/dL (ref 70–99)
Glucose-Capillary: 133 mg/dL — ABNORMAL HIGH (ref 70–99)

## 2012-01-08 LAB — CARDIAC PANEL(CRET KIN+CKTOT+MB+TROPI)
CK, MB: 2.9 ng/mL (ref 0.3–4.0)
Relative Index: INVALID (ref 0.0–2.5)
Total CK: 91 U/L (ref 7–177)
Troponin I: <0.3 ng/mL (ref <0.30)

## 2012-01-08 LAB — MAGNESIUM: Magnesium: 1.8 mg/dL (ref 1.5–2.5)

## 2012-01-08 LAB — PHOSPHORUS: Phosphorus: 3.9 mg/dL (ref 2.3–4.6)

## 2012-01-08 LAB — PRO B NATRIURETIC PEPTIDE: Pro B Natriuretic peptide (BNP): 187.8 pg/mL — ABNORMAL HIGH (ref 0–125)

## 2012-01-08 MED ORDER — SODIUM CHLORIDE 0.9 % IJ SOLN
3.0000 mL | Freq: Two times a day (BID) | INTRAMUSCULAR | Status: DC
  Administered 2012-01-09 – 2012-01-14 (×8): 3 mL via INTRAVENOUS

## 2012-01-08 MED ORDER — NITROGLYCERIN 0.4 MG SL SUBL: 0.4000 mg | SUBLINGUAL_TABLET | SUBLINGUAL | Status: DC | PRN

## 2012-01-08 MED ORDER — ALLOPURINOL 300 MG PO TABS
300.0000 mg | ORAL_TABLET | Freq: Every day | ORAL | Status: DC
  Administered 2012-01-08 – 2012-01-21 (×13): 300 mg via ORAL
  Filled 2012-01-08 (×14): qty 1

## 2012-01-08 MED ORDER — ALBUTEROL SULFATE HFA 108 (90 BASE) MCG/ACT IN AERS
2.0000 | INHALATION_SPRAY | RESPIRATORY_TRACT | Status: DC | PRN
  Filled 2012-01-08: qty 6.7

## 2012-01-08 MED ORDER — PANTOPRAZOLE SODIUM 40 MG PO TBEC
40.0000 mg | DELAYED_RELEASE_TABLET | Freq: Every day | ORAL | Status: DC
  Administered 2012-01-08 – 2012-01-21 (×13): 40 mg via ORAL
  Filled 2012-01-08 (×12): qty 1

## 2012-01-08 MED ORDER — POTASSIUM CHLORIDE 40 MEQ/15ML (20%) PO LIQD
20.0000 meq | Freq: Every day | ORAL | Status: DC
  Filled 2012-01-08 (×9): qty 7.5

## 2012-01-08 MED ORDER — BRIMONIDINE TARTRATE 0.2 % OP SOLN
1.0000 [drp] | Freq: Two times a day (BID) | OPHTHALMIC | Status: DC
  Administered 2012-01-08 – 2012-01-21 (×26): 1 [drp] via OPHTHALMIC
  Filled 2012-01-08: qty 5

## 2012-01-08 MED ORDER — SODIUM CHLORIDE 0.9 % IJ SOLN: 3.0000 mL | INTRAMUSCULAR | Status: DC | PRN

## 2012-01-08 MED ORDER — FUROSEMIDE 20 MG PO TABS
20.0000 mg | ORAL_TABLET | Freq: Every day | ORAL | Status: DC
  Administered 2012-01-08 – 2012-01-21 (×13): 20 mg via ORAL
  Filled 2012-01-08 (×14): qty 1

## 2012-01-08 MED ORDER — AZELASTINE HCL 0.1 % NA SOLN
2.0000 | Freq: Every day | NASAL | Status: DC
  Administered 2012-01-08 – 2012-01-21 (×13): 2 via NASAL
  Filled 2012-01-08: qty 30

## 2012-01-08 MED ORDER — SIMVASTATIN 40 MG PO TABS
40.0000 mg | ORAL_TABLET | Freq: Every morning | ORAL | Status: DC
  Administered 2012-01-09 – 2012-01-21 (×12): 40 mg via ORAL
  Filled 2012-01-08 (×13): qty 1

## 2012-01-08 MED ORDER — PREGABALIN 50 MG PO CAPS
100.0000 mg | ORAL_CAPSULE | Freq: Every day | ORAL | Status: DC
  Administered 2012-01-08 – 2012-01-14 (×7): 100 mg via ORAL
  Filled 2012-01-08 (×7): qty 2

## 2012-01-08 MED ORDER — MONTELUKAST SODIUM 10 MG PO TABS
10.0000 mg | ORAL_TABLET | Freq: Every day | ORAL | Status: DC
  Administered 2012-01-09 – 2012-01-20 (×12): 10 mg via ORAL
  Filled 2012-01-08 (×13): qty 1

## 2012-01-08 MED ORDER — ASPIRIN 325 MG PO TABS
325.0000 mg | ORAL_TABLET | Freq: Every day | ORAL | Status: DC
  Administered 2012-01-08 – 2012-01-14 (×7): 325 mg via ORAL
  Filled 2012-01-08 (×7): qty 1

## 2012-01-08 MED ORDER — LATANOPROST 0.005 % OP SOLN
1.0000 [drp] | Freq: Every day | OPHTHALMIC | Status: DC
  Administered 2012-01-08 – 2012-01-20 (×13): 1 [drp] via OPHTHALMIC
  Filled 2012-01-08: qty 2.5

## 2012-01-08 MED ORDER — POLYETHYLENE GLYCOL 3350 17 G PO PACK
17.0000 g | PACK | Freq: Every day | ORAL | Status: DC | PRN
  Filled 2012-01-08: qty 1

## 2012-01-08 MED ORDER — TRAMADOL HCL 50 MG PO TABS
50.0000 mg | ORAL_TABLET | Freq: Two times a day (BID) | ORAL | Status: DC | PRN
  Administered 2012-01-08 – 2012-01-14 (×7): 50 mg via ORAL
  Filled 2012-01-08 (×8): qty 1

## 2012-01-08 MED ORDER — SERTRALINE HCL 50 MG PO TABS
150.0000 mg | ORAL_TABLET | Freq: Every day | ORAL | Status: DC
  Administered 2012-01-08 – 2012-01-20 (×13): 150 mg via ORAL
  Filled 2012-01-08 (×14): qty 1

## 2012-01-08 MED ORDER — KETOROLAC TROMETHAMINE 30 MG/ML IJ SOLN
30.0000 mg | Freq: Once | INTRAMUSCULAR | Status: AC
  Administered 2012-01-08: 30 mg via INTRAMUSCULAR

## 2012-01-08 MED ORDER — METOPROLOL SUCCINATE ER 50 MG PO TB24
50.0000 mg | ORAL_TABLET | Freq: Two times a day (BID) | ORAL | Status: DC
  Administered 2012-01-08 – 2012-01-21 (×25): 50 mg via ORAL
  Filled 2012-01-08 (×28): qty 1

## 2012-01-08 MED ORDER — INSULIN GLARGINE 100 UNIT/ML ~~LOC~~ SOLN
20.0000 [IU] | Freq: Every day | SUBCUTANEOUS | Status: DC
  Administered 2012-01-08 – 2012-01-20 (×13): 20 [IU] via SUBCUTANEOUS

## 2012-01-08 MED ORDER — DOCUSATE SODIUM 100 MG PO CAPS
100.0000 mg | ORAL_CAPSULE | Freq: Two times a day (BID) | ORAL | Status: DC | PRN
  Filled 2012-01-08 (×2): qty 1

## 2012-01-08 MED ORDER — HYDROCHLOROTHIAZIDE 12.5 MG PO CAPS
12.5000 mg | ORAL_CAPSULE | Freq: Every day | ORAL | Status: DC
  Administered 2012-01-08 – 2012-01-21 (×13): 12.5 mg via ORAL
  Filled 2012-01-08 (×14): qty 1

## 2012-01-08 MED ORDER — INSULIN ASPART 100 UNIT/ML ~~LOC~~ SOLN
0.0000 [IU] | Freq: Three times a day (TID) | SUBCUTANEOUS | Status: DC
  Administered 2012-01-09 (×2): 1 [IU] via SUBCUTANEOUS
  Administered 2012-01-10 – 2012-01-11 (×2): 2 [IU] via SUBCUTANEOUS
  Administered 2012-01-12: 1 [IU] via SUBCUTANEOUS
  Administered 2012-01-15: 2 [IU] via SUBCUTANEOUS
  Administered 2012-01-16: 1 [IU] via SUBCUTANEOUS
  Administered 2012-01-17 (×2): 2 [IU] via SUBCUTANEOUS
  Administered 2012-01-17: 1 [IU] via SUBCUTANEOUS
  Administered 2012-01-18 (×2): 2 [IU] via SUBCUTANEOUS

## 2012-01-08 MED ORDER — SODIUM CHLORIDE 0.9 % IJ SOLN
3.0000 mL | Freq: Two times a day (BID) | INTRAMUSCULAR | Status: DC
Start: 2012-01-08 — End: 2012-01-21
  Administered 2012-01-08 – 2012-01-19 (×13): 3 mL via INTRAVENOUS

## 2012-01-08 MED ORDER — HEPARIN SODIUM (PORCINE) 5000 UNIT/ML IJ SOLN
5000.0000 [IU] | Freq: Three times a day (TID) | INTRAMUSCULAR | Status: DC
  Administered 2012-01-08 – 2012-01-14 (×18): 5000 [IU] via SUBCUTANEOUS
  Filled 2012-01-08 (×25): qty 1

## 2012-01-08 MED ORDER — SODIUM CHLORIDE 0.9 % IV SOLN: 250.0000 mL | INTRAVENOUS | Status: DC | PRN

## 2012-01-08 MED ORDER — LOSARTAN POTASSIUM-HCTZ 100-25 MG PO TABS: 0.5000 | ORAL_TABLET | Freq: Every day | ORAL | Status: DC

## 2012-01-08 MED ORDER — LOSARTAN POTASSIUM 50 MG PO TABS
50.0000 mg | ORAL_TABLET | Freq: Every day | ORAL | Status: DC
  Administered 2012-01-08 – 2012-01-21 (×13): 50 mg via ORAL
  Filled 2012-01-08 (×14): qty 1

## 2012-01-08 MED ORDER — FLUTICASONE PROPIONATE HFA 44 MCG/ACT IN AERO
2.0000 | INHALATION_SPRAY | Freq: Two times a day (BID) | RESPIRATORY_TRACT | Status: DC
  Administered 2012-01-08 – 2012-01-21 (×24): 2 via RESPIRATORY_TRACT
  Filled 2012-01-08 (×2): qty 10.6

## 2012-01-08 NOTE — Progress Notes (Signed)
01/08/2012  Patient is alert and oriented, uses a cane with ambulation. Patient is one assist. Patient was a direct admit from the Md office to [id number]. Patient is a fall risk, she was placed on a bed alarm, red socks and yellow arm band was placed on patient. Patient stated she have glaucoma in bilateral eyes, and the left hand had carpel tunnel surgery. Patient was place on telemetry. Safety video was observed. Gloriajean Dell RN

## 2012-01-08 NOTE — H&P (Signed)
Patient ID: [name], female DOB: 12-11-1950, 61 y.o. MRN: [id number]  Was seen in clinic for followup and complained of continued headaches that she has had since her concussion approximately 6 weeks ago.  Also having episodic dizzy spells that last usually 20 minutes or so. They're not associated with nausea or vomiting. Denies chest pain. Has noted no shortness of breath. Has been taking her medicines regularly without problem.  As she was leaving for the visit today and was in the laboratory she had a syncopal event. Was witnessed by 2 staff members. She appeared to lose consciousness and fall backwards. She was noted to be somewhat pale. No diaphoresis. Was placed back on exam table and blood pressure and blood sugar were evaluated. She continued to have headache. We gave her 30 mg IM Toradol without much relief. Headache and dizziness continued. Dizziness is worse with any attempt at standing.  Review of systems. Positive for dizziness and headache. Denies shortness of breath, chest pains, palpitations. Has some chronic issues with back pain and her chronic neuropathic burning pain in her hands and feet but no new symptoms. Appetite has been okay. Energy level has been okay. She's been sleeping well.  Patient Active Problem List   Diagnoses   .  HERPES SIMPLEX INFECTION, RECURRENT   .  DIABETES MELLITUS II, UNCOMPLICATED   .  HYPERLIPIDEMIA-MIXED   .  DEPRESSION, MAJOR, RECURRENT   .  OBSTRUCTIVE SLEEP APNEA   .  CARPAL TUNNEL SYNDROME, BILATERAL   .  PERIPHERAL NEUROPATHY   .  GLAUCOMA   .  NEURAL HEARING LOSS BILATERAL   .  HYPERTENSION, BENIGN SYSTEMIC   .  PARALYSIS, VOCAL CORD, UNILATERAL, TOTAL   .  GERD   .  IRRITABLE BOWEL SYNDROME   .  GAIT IMBALANCE   .  CATARACT EXTRACTION STATUS   .  DEGENERATIVE JOINT DISEASE, KNEE   .  Thoracolumbar back pain   .  Allergic rhinitis due to pollen   .  Hip pain, bilateral   .  Knee pain, bilateral   .  Hip arthritis   .  Nail  dystrophy    Current Outpatient Prescriptions on File Prior to Visit   Medication  Sig  Dispense  Refill   .  acyclovir (ZOVIRAX) 400 MG tablet  Take 800 mg by mouth 2 (two) times daily.     [name]  albuterol (PROVENTIL HFA;VENTOLIN HFA) 108 (90 BASE) MCG/ACT inhaler  Inhale 2 puffs into the lungs every 4 (four) hours as needed. For shortness of breath     .  albuterol (PROVENTIL) (2.5 MG/3ML) 0.083% nebulizer solution  Take 3 mLs (2.5 mg total) by nebulization every 6 (six) hours as needed for wheezing or shortness of breath.  75 mL  12   .  allopurinol (ZYLOPRIM) 300 MG tablet  Take 1 tablet (300 mg total) by mouth daily.  30 tablet  0   .  arformoterol (BROVANA) 15 MCG/2ML NEBU  Take 15 mcg by nebulization 2 (two) times daily as needed. For shortness of breath     .  aspirin 81 MG tablet  Take 4 tablets (325 mg total) by mouth daily.  30 tablet  3   .  azelastine (ASTELIN) 137 MCG/SPRAY nasal spray  Place 2 sprays into the nose daily as needed. Use in each nostril as directed  30 mL  0   .  beclomethasone (QVAR) 80 MCG/ACT inhaler  Inhale 1 puff into the lungs as  needed. For wheezing.  1 Inhaler  0   .  brimonidine (ALPHAGAN P) 0.1 % SOLN  Place 1 drop into the left eye 2 (two) times daily.     [name]  docusate sodium (COLACE) 100 MG capsule  Take 1 capsule (100 mg total) by mouth 2 (two) times daily as needed for constipation.  180 capsule  1   .  esomeprazole (NEXIUM) 40 MG capsule  Take 1 capsule (40 mg total) by mouth 2 (two) times daily.  30 capsule  0   .  furosemide (LASIX) 20 MG tablet  Take 1 tablet (20 mg total) by mouth daily.  30 tablet  0   .  insulin glargine (LANTUS) 100 UNIT/ML injection  Inject 40 Units into the skin daily.     [name]  latanoprost (XALATAN) 0.005 % ophthalmic solution  Place 1 drop into both eyes at bedtime.  2.5 mL  0   .  losartan-hydrochlorothiazide (HYZAAR) 100-25 MG per tablet  Take 1 tablet by mouth daily.  30 tablet  0   .  metFORMIN (GLUCOPHAGE) 1000 MG tablet  Take 1  tablet (1,000 mg total) by mouth daily with breakfast. Take 1.5 tablets by mouth in the am and 1 tablets in the PM  75 tablet  0   .  metoprolol succinate (TOPROL-XL) 50 MG 24 hr tablet  Take 1 tablet (50 mg total) by mouth 2 (two) times daily.  60 tablet  6   .  montelukast (SINGULAIR) 10 MG tablet  Take 1 tablet (10 mg total) by mouth at bedtime.  30 tablet  0   .  nitroGLYCERIN (NITROSTAT) 0.4 MG SL tablet  Place 1 tablet (0.4 mg total) under the tongue every 5 (five) minutes as needed for chest pain.  100 tablet  3   .  polyethylene glycol (MIRALAX / GLYCOLAX) packet  Take 17 g by mouth daily as needed. For constipation  14 each  0   .  potassium chloride 40 MEQ/15ML (20%) LIQD  Take 15 ml ONCE dialy     .  pregabalin (LYRICA) 100 MG capsule  Take one by mouth in the morning and two at bedtime.  90 capsule  12   .  sertraline (ZOLOFT) 100 MG tablet  Take 1.5 tablets (150 mg total) by mouth at bedtime.  45 tablet  0   .  simvastatin (ZOCOR) 40 MG tablet  Take 1 tablet (40 mg total) by mouth every morning.  30 tablet  0   .  traMADol (ULTRAM) 50 MG tablet  Take one or two tablets by mouth every 8 hours as needed for joint pain. Maximum of 6 tablets in 24 hour period.  180 tablet  5    No current facility-administered medications on file prior to visit.    Past Medical History   Diagnosis  Date   .  Chronic cough      Now followed by pulmonary   .  Sleep apnea      on CPAP 12   .  HTN (hypertension)    .  Dyslipidemia    .  Gout    .  History of colonoscopy    .  Concussion  11/18/11     "fell at dr's office; hit head"   .  Complication of anesthesia      "just wake up coughing; that's all"   .  Heart murmur    .  Asthma    .  COPD (chronic obstructive pulmonary disease)    .  Pneumonia      "i've had it once" (11/18/11)   .  Diabetes mellitus type II      "take insulin & pills"   .  Blood transfusion    .  GERD (gastroesophageal reflux disease)    .  Headache  11/18/11     "I've  had mild headaches the last couple days"   .  Arthritis    .  Chronic lower back pain    .  Vocal cord paralysis, unilateral partial      "right"   .  Depression     Past Surgical History   Procedure  Date   .  Colonoscopy w/ biopsies  11/21/2010     adenoma polyp--no hi grade dysplasia Dr [address]   .  Treatment fistula anal    .  Breast reduction surgery  04-21-2003   .  Tubal ligation  1987   .  Cardiac catheterization  03-02-2004   .  Bronchoscopy  07-2007   .  Carpal tunnel release  2011     left   .  Knee arthroscopy  08/2000     right   .  Shoulder arthroscopy  08/2000     right   .  Naso-pharyn scope  09/11/06     ENT, normal   .  T-score  09/02/04     -0.54 (low risk currently)   .  Total abdominal hysterectomy  10-07-2000   .  Cesarean section  1987   .  Shoulder arthroscopy w/ rotator cuff repair  10/18/2003     left   .  Breast surgery    .  Cataract extraction  1955; 1979     bilateral; right eye   .  Eye surgery     Physical Exam Vital signs reviewed from outpatient GENERALl: Well developed, well nourished, in no acute distress.  HEENT: resting price tag months, legally blind. Conjunctivae nonicteric.  NECK: Supple, FROM, without lymphadenopathy.  THYROID: normal without nodularity  CAROTID ARTERIES: without bruits  LUNGS: clear to auscultation bilaterally. No wheezes or rales.  HEART: Regular rate and rhythm, 1/6 SEM   ABDOMEN: soft with positive bowel sounds  NEURO:  Her normal gait is somewhat unstable and wide based. After her episode in the laboratory she was unable to stand unassisted secondary to dizziness. Now still unbalance on admission. SKIN: No rashes noted. Pink, good cap refill PSYCHIATRIC: Alert and oriented x4  ASSESSMENT/PLAN: Patient well-known to outpatient clinic seen for post concussive syndrome,  With witness syncopal event. She agreed on admission overnight for observation.  1. Syncope: We'll place her on telemetry. Will rule out cardiac causes  as well with Echo (with 1/6 SEM heard), Carotids, also will do Head MRI.  Will check labs such as TSH, Vit D, BMET, Mag, Phos to make sure not an electrolyte disturbance.  Will hold on neurology or cardiology consult until initial labs are back.  2. Post concussive syndrome: As she has not recovered from her concussion which was greater than a month ago I think it would be prudent at this time to do the MRI without contrast of her head. I'll also consider doing carotid Dopplers to rule out stenosis which could be contributing. 3. DM- Sliding scale while inpatient in case further imaging is needed Will give some lantus for now but not total home dose with patient being on controlled diet in patient.  4. HTN-  Continue home regimen 5. HLD- Continue current therapy, will get lipid panel  Lab Results  Component Value Date   LDLCALC 138* 07/03/2011  6. Depression- Continue zoloft for now.  7. OSA- will discuss with patient and see if she needs a CPAP machine while here.  8. GERD- Continue PPI 9. PPX- will place on heparin  10 Dispo- Pending further workup

## 2012-01-08 NOTE — H&P (Signed)
[facility] Attending Admission Note: Angell Pincock MD [phone] pager office [phone] I  have seen and examined this patient, reviewed their chart. I have discussed this patient with the resident. I agree with the resident's findings, assessment and care plan. 

## 2012-01-08 NOTE — Progress Notes (Signed)
Patient ID: [name] [name], female   DOB: Oct 27, 1950, 61 y.o.   MRN: [id number] Was seen in clinic for followup and complained of continued headaches that she has had since her concussion. Also having episodic dizzy spells that last usually 20 minutes or so. They're not associated with nausea or vomiting. Denies chest pain. Has noted no shortness of breath. Has been taking her medicines regularly without problem.  As she was leaving for the visit today and was in the laboratory she had a syncopal event. Was witnessed by 2 staff members. She appeared to lose consciousness and fall backwards. She was noted to be somewhat pale. No diaphoresis. Was placed back on exam table and blood pressure and blood sugar were evaluated. She continued to have headache. We gave her 30 mg IM Toradol without much relief. Headache and dizziness continued. Dizziness is worse with any attempt at standing.  Review of systems. Positive for dizziness and headache. Denies shortness of breath, chest pains, palpitations. Has some chronic issues with back pain and her chronic neuropathic burning pain in her hands and feet but no new symptoms. Appetite has been okay. Energy level has been okay. She's been sleeping well.  Patient Active Problem List  Diagnoses  . HERPES SIMPLEX INFECTION, RECURRENT  . DIABETES MELLITUS II, UNCOMPLICATED  . HYPERLIPIDEMIA-MIXED  . DEPRESSION, MAJOR, RECURRENT  . OBSTRUCTIVE SLEEP APNEA  . CARPAL TUNNEL SYNDROME, BILATERAL  . PERIPHERAL NEUROPATHY  . GLAUCOMA  . NEURAL HEARING LOSS BILATERAL  . HYPERTENSION, BENIGN SYSTEMIC  . PARALYSIS, VOCAL CORD, UNILATERAL, TOTAL  . GERD  . IRRITABLE BOWEL SYNDROME  . GAIT IMBALANCE  . CATARACT EXTRACTION STATUS  . DEGENERATIVE JOINT DISEASE, KNEE  . Thoracolumbar back pain  . Allergic rhinitis due to pollen  . Hip pain, bilateral  . Knee pain, bilateral  . Hip arthritis  . Nail dystrophy    Current Outpatient Prescriptions on File Prior to Visit    Medication Sig Dispense Refill  . acyclovir (ZOVIRAX) 400 MG tablet Take 800 mg by mouth 2 (two) times daily.        [name] albuterol (PROVENTIL HFA;VENTOLIN HFA) 108 (90 BASE) MCG/ACT inhaler Inhale 2 puffs into the lungs every 4 (four) hours as needed. For shortness of breath      . albuterol (PROVENTIL) (2.5 MG/3ML) 0.083% nebulizer solution Take 3 mLs (2.5 mg total) by nebulization every 6 (six) hours as needed for wheezing or shortness of breath.  75 mL  12  . allopurinol (ZYLOPRIM) 300 MG tablet Take 1 tablet (300 mg total) by mouth daily.  30 tablet  0  . arformoterol (BROVANA) 15 MCG/2ML NEBU Take 15 mcg by nebulization 2 (two) times daily as needed. For shortness of breath      . aspirin 81 MG tablet Take 4 tablets (325 mg total) by mouth daily.  30 tablet  3  . azelastine (ASTELIN) 137 MCG/SPRAY nasal spray Place 2 sprays into the nose daily as needed. Use in each nostril as directed  30 mL  0  . beclomethasone (QVAR) 80 MCG/ACT inhaler Inhale 1 puff into the lungs as needed. For wheezing.  1 Inhaler  0  . brimonidine (ALPHAGAN P) 0.1 % SOLN Place 1 drop into the left eye 2 (two) times daily.        [name] docusate sodium (COLACE) 100 MG capsule Take 1 capsule (100 mg total) by mouth 2 (two) times daily as needed for constipation.  180 capsule  1  . esomeprazole (NEXIUM)  40 MG capsule Take 1 capsule (40 mg total) by mouth 2 (two) times daily.  30 capsule  0  . furosemide (LASIX) 20 MG tablet Take 1 tablet (20 mg total) by mouth daily.  30 tablet  0  . insulin glargine (LANTUS) 100 UNIT/ML injection Inject 40 Units into the skin daily.      [name] latanoprost (XALATAN) 0.005 % ophthalmic solution Place 1 drop into both eyes at bedtime.  2.5 mL  0  . losartan-hydrochlorothiazide (HYZAAR) 100-25 MG per tablet Take 1 tablet by mouth daily.  30 tablet  0  . metFORMIN (GLUCOPHAGE) 1000 MG tablet Take 1 tablet (1,000 mg total) by mouth daily with breakfast. Take 1.5 tablets by mouth in the am and 1 tablets in  the PM  75 tablet  0  . metoprolol succinate (TOPROL-XL) 50 MG 24 hr tablet Take 1 tablet (50 mg total) by mouth 2 (two) times daily.  60 tablet  6  . montelukast (SINGULAIR) 10 MG tablet Take 1 tablet (10 mg total) by mouth at bedtime.  30 tablet  0  . nitroGLYCERIN (NITROSTAT) 0.4 MG SL tablet Place 1 tablet (0.4 mg total) under the tongue every 5 (five) minutes as needed for chest pain.  100 tablet  3  . polyethylene glycol (MIRALAX / GLYCOLAX) packet Take 17 g by mouth daily as needed. For constipation  14 each  0  . potassium chloride 40 MEQ/15ML (20%) LIQD Take 15 ml ONCE dialy      . pregabalin (LYRICA) 100 MG capsule Take one by mouth in the morning and two at bedtime.  90 capsule  12  . sertraline (ZOLOFT) 100 MG tablet Take 1.5 tablets (150 mg total) by mouth at bedtime.  45 tablet  0  . simvastatin (ZOCOR) 40 MG tablet Take 1 tablet (40 mg total) by mouth every morning.  30 tablet  0  . traMADol (ULTRAM) 50 MG tablet Take one or two tablets by mouth every 8 hours as needed for joint pain. Maximum of 6 tablets in 24 hour period.  180 tablet  5   No current facility-administered medications on file prior to visit.   Past Medical History  Diagnosis Date  . Chronic cough     Now followed by pulmonary  . Sleep apnea     on CPAP 12  . HTN (hypertension)   . Dyslipidemia   . Gout   . History of colonoscopy   . Concussion 11/18/11    "fell at dr's office; hit head"  . Complication of anesthesia     "just wake up coughing; that's all"  . Heart murmur   . Asthma   . COPD (chronic obstructive pulmonary disease)   . Pneumonia     "i've had it once" (11/18/11)  . Diabetes mellitus type II     "take insulin & pills"  . Blood transfusion   . GERD (gastroesophageal reflux disease)   . Headache 11/18/11    "I've had mild headaches the last couple days"  . Arthritis   . Chronic lower back pain   . Vocal cord paralysis, unilateral partial     "right"  . Depression    Past Surgical  History  Procedure Date  . Colonoscopy w/ biopsies 11/21/2010    adenoma polyp--no hi grade dysplasia Dr [address]  . Treatment fistula anal   . Breast reduction surgery 04-21-2003  . Tubal ligation 1987  . Cardiac catheterization 03-02-2004  . Bronchoscopy 07-2007  . Carpal  tunnel release 2011    left  . Knee arthroscopy 08/2000    right  . Shoulder arthroscopy 08/2000    right  . Naso-pharyn scope 09/11/06    ENT, normal  . T-score 09/02/04    -0.54 (low risk currently)  . Total abdominal hysterectomy 10-07-2000  . Cesarean section 1987  . Shoulder arthroscopy w/ rotator cuff repair 10/18/2003    left  . Breast surgery   . Cataract extraction 1955; 1979    bilateral; right eye  . Eye surgery   Vital signs reviewed GENERALl: Well developed, well nourished, in no acute distress. HEENT: resting price tag months, legally blind. Conjunctivae nonicteric. NECK: Supple, FROM, without lymphadenopathy.  THYROID: normal without nodularity CAROTID ARTERIES: without bruits LUNGS: clear to auscultation bilaterally. No wheezes or rales. HEART: Regular rate and rhythm, no murmurs ABDOMEN: soft with positive bowel sounds NEURO: Moderate to severe loss of soft touch sensation bilateral hands to midforearm, bilateral feet to right above ankle. Not steady standing with her eyes closed. When she arrived at clinic she was feisty and walking with her normal gait and her pain. Her normal gait is somewhat unstable and wide based. After her episode in the laboratory she was unable to stand unassisted secondary to dizziness. SKIN: No rashes noted. She had an episode where she had significant sweating and her skin color was more pale right after her syncopal event but that resolved. PSYCHIATRIC: Alert and oriented x4 with intact sense of humor during the first part of her office visit. After her syncopal event she seemed a little bit disoriented.  ASSESSMENT/PLAN: Patient well-known to me here for followup of post  concussive syndrome. While in clinic she experienced a syncopal event. Despite the fact that we kept her here several hours and she maintained a normal blood pressure and had a blood sugar of 197 she continued to have dizziness which worsened with any attempted sitting up or standing. She complained of headache 6-7/10 which is typical with what she's been experiencing at home. We gave her 30 mg IM Toradol with no relief. We also fed her lunch of half a chef salad and that did not improve her symptoms. I had long discussion with her and am uncomfortable letting her go home. She agreed on admission overnight for observation. 1. Syncope:  We'll place her on telemetry.  2. Post concussive syndrome: As she has not recovered from her concussion which was greater than a month ago I think it would be prudent at this time to do the MRI without contrast of her head. I'll also consider doing carotid Dopplers to rule out stenosis which could be contributing. I will follow her inpatient as well.

## 2012-01-09 ENCOUNTER — Observation Stay (HOSPITAL_COMMUNITY): Payer: Medicare Other

## 2012-01-09 DIAGNOSIS — R55 Syncope and collapse: Secondary | ICD-10-CM

## 2012-01-09 DIAGNOSIS — I059 Rheumatic mitral valve disease, unspecified: Secondary | ICD-10-CM

## 2012-01-09 LAB — CARDIAC PANEL(CRET KIN+CKTOT+MB+TROPI)
CK, MB: 2.8 ng/mL (ref 0.3–4.0)
CK, MB: 5.1 ng/mL — ABNORMAL HIGH (ref 0.3–4.0)
Relative Index: INVALID (ref 0.0–2.5)
Relative Index: 1.1 (ref 0.0–2.5)
Total CK: 79 U/L (ref 7–177)
Total CK: 474 U/L — ABNORMAL HIGH (ref 7–177)
Troponin I: <0.3 ng/mL (ref <0.30)
Troponin I: <0.3 ng/mL (ref <0.30)

## 2012-01-09 LAB — VITAMIN D 25 HYDROXY (VIT D DEFICIENCY, FRACTURES): Vit D, 25-Hydroxy: 19 ng/mL — ABNORMAL LOW (ref 30–89)

## 2012-01-09 LAB — CBC
HCT: 34.8 % — ABNORMAL LOW (ref 36.0–46.0)
Hemoglobin: 11 g/dL — ABNORMAL LOW (ref 12.0–15.0)
MCH: 27.7 pg (ref 26.0–34.0)
MCHC: 31.6 g/dL (ref 30.0–36.0)
MCV: 87.7 fL (ref 78.0–100.0)
Platelets: 161 10*3/uL (ref 150–400)
RBC: 3.97 MIL/uL (ref 3.87–5.11)
RDW: 14.6 % (ref 11.5–15.5)
WBC: 8.9 10*3/uL (ref 4.0–10.5)

## 2012-01-09 LAB — GLUCOSE, CAPILLARY
Glucose-Capillary: 143 mg/dL — ABNORMAL HIGH (ref 70–99)
Glucose-Capillary: 139 mg/dL — ABNORMAL HIGH (ref 70–99)
Glucose-Capillary: 112 mg/dL — ABNORMAL HIGH (ref 70–99)
Glucose-Capillary: 166 mg/dL — ABNORMAL HIGH (ref 70–99)

## 2012-01-09 LAB — BASIC METABOLIC PANEL
BUN: 15 mg/dL (ref 6–23)
CO2: 29 mEq/L (ref 19–32)
Calcium: 9.6 mg/dL (ref 8.4–10.5)
Chloride: 105 mEq/L (ref 96–112)
Creatinine, Ser: 0.73 mg/dL (ref 0.50–1.10)
GFR calc Af Amer: >90 mL/min (ref >90)
GFR calc non Af Amer: >90 mL/min (ref >90)
Glucose, Bld: 110 mg/dL — ABNORMAL HIGH (ref 70–99)
Potassium: 4.1 mEq/L (ref 3.5–5.1)
Sodium: 144 mEq/L (ref 135–145)

## 2012-01-09 LAB — TSH: TSH: 1.325 u[IU]/mL (ref 0.350–4.500)

## 2012-01-09 MED ORDER — VITAMIN D (ERGOCALCIFEROL) 1.25 MG (50000 UNIT) PO CAPS
50000.0000 [IU] | ORAL_CAPSULE | ORAL | Status: DC
  Administered 2012-01-09 – 2012-01-16 (×2): 50000 [IU] via ORAL
  Filled 2012-01-09 (×2): qty 1

## 2012-01-09 MED ORDER — POTASSIUM CHLORIDE 20 MEQ/15ML (10%) PO LIQD
20.0000 meq | Freq: Every day | ORAL | Status: DC
  Administered 2012-01-09 – 2012-01-21 (×12): 20 meq via ORAL
  Filled 2012-01-09 (×13): qty 15

## 2012-01-09 MED ORDER — GADOBENATE DIMEGLUMINE 529 MG/ML IV SOLN
17.0000 mL | Freq: Once | INTRAVENOUS | Status: AC
  Administered 2012-01-09: 17 mL via INTRAVENOUS

## 2012-01-09 NOTE — Consult Note (Signed)
Reason for Consult: Cervical stenosis Referring Physician: Family practice  [name] [name] is an 61 y.o. female.   HPI:  This is a 61 year old female with multiple medical problems who was admitted after syncopal event yesterday while in the doctor's office. Was witnessed by 2 people. She has a history of a recent fall with a postconcussive syndrome. I believe the fall was in March. She has a history of diabetes, complains of a history of numbness and tingling in her hands L>R with clumsiness of the hands and some difficulty with gait. She uses either a cane or a walker. No significant Neck pain, some aching in the arms. MRI was performed which showed a large disc herniation at C3-4 with severe stenosis, spondylosis at other levels, and neurosurgical evaluation was requested.  Past Medical History  Diagnosis Date  . Chronic cough     Now followed by pulmonary  . Sleep apnea     on CPAP 12  . HTN (hypertension)   . Dyslipidemia   . Gout   . History of colonoscopy   . Concussion 11/18/11    "fell at dr's office; hit head"  . Complication of anesthesia     "just wake up coughing; that's all"  . Heart murmur   . Asthma   . COPD (chronic obstructive pulmonary disease)   . Diabetes mellitus type II     "take insulin & pills"  . Blood transfusion   . GERD (gastroesophageal reflux disease)   . Arthritis   . Chronic lower back pain   . Vocal cord paralysis, unilateral partial     "right"  . Depression   . Peripheral neuropathy   . Angina   . Pneumonia     "i've had it once" (11/18/11)  . Exertional dyspnea   . Headache 11/18/11    "I've had mild headaches the last couple days"  . Headache 01/08/12    "pretty constant since 11/18/11's concussion"    Past Surgical History  Procedure Date  . Colonoscopy w/ biopsies 11/21/2010    adenoma polyp--no hi grade dysplasia Dr [address]  . Treatment fistula anal   . Breast reduction surgery 04-21-2003  . Tubal ligation 1987  . Cardiac  catheterization 03-02-2004  . Bronchoscopy 07-2007  . Knee arthroscopy 08/2000    right  . Shoulder arthroscopy 08/2000    right  . T-score 09/02/04    -0.54 (low risk currently)  . Total abdominal hysterectomy 10-07-2000  . Cesarean section 1987  . Shoulder arthroscopy w/ rotator cuff repair 10/18/2003    left  . Cataract extraction 1955; 1979    bilateral; right eye  . Eye surgery   . Breast biopsy     bilaterally  . Breast cyst excision     right twice; left once  . Carpal tunnel release     left    Allergies  Allergen Reactions  . Amlodipine Besylate Swelling  . Lisinopril-Hydrochlorothiazide Cough    REACTION: cough-- changed to arb  . Propoxyphene Hcl Itching  . Valsartan Other (See Comments)    REACTION:  "sleep more the next day after I took it; it made me real tired"  . Metronidazole     REACTION: "just didn't work; my body never did heal from it"    History  Substance Use Topics  . Smoking status: Former Smoker -- 0.5 packs/day for .5 years    Types: Cigarettes    Quit date: 09/03/1975  . Smokeless tobacco: Never Used  .  Alcohol Use: No    Family History  Problem Relation Age of Onset  . Diabetes Mother   . Colon cancer Neg Hx      Review of Systems  Positive ROS: neg  All other systems have been reviewed and were otherwise negative with the exception of those mentioned in the HPI and as above.  Objective: Vital signs in last 24 hours: Temp:  [97.9 F (36.6 C)-98.5 F (36.9 C)] 98.5 F (36.9 C) (05/09 1040) Pulse Rate:  [60-96] 96  (05/09 1040) Resp:  [20] 20  (05/09 1040) BP: (153-156)/(70-78) 153/70 mmHg (05/09 1040) SpO2:  [97 %-100 %] 98 % (05/09 1040)  General Appearance: Alert, cooperative, no distress, appears stated age Head: Normocephalic, without obvious abnormality, atraumatic Eyes: Pupils are irregular from multiple surgeries for congenital cataracts, she wears very thick glasses and vision is poor     Throat: benign Neck:  Supple, Lungs: Clear to auscultation bilaterally, respirations unlabored Heart: Regular rate and rhythm Extremities: Extremities normal, atraumatic, no cyanosis or edema Pulses: 2+ and symmetric all extremities  NEUROLOGIC:   Mental status: A&O x4, no aphasia, good attention span, Memory and fund of knowledge Motor Exam - handgrips 4 minus out of 5 on the left, 4/5 on the right, remainder of her strength seems to be pretty good Sensory Exam - decreased in the hands Reflexes: Diffusely hyperreflexic, mildly positive Hoffman's, No clonus Gait - antalgic but not necessarily spastic, uses a cane Cranial Nerves: I: smell Not tested  II: visual acuity   not tested   II: visual fields  not tested   II: pupils  irregular   III,VII: ptosis None  III,IV,VI: extraocular muscles  Full ROM  V: mastication Normal  V: facial light touch sensation  Normal  V,VII: corneal reflex  Present  VII: facial muscle function - upper  Normal  VII: facial muscle function - lower Normal  VIII: hearing Not tested  IX: soft palate elevation  Normal  IX,X: gag reflex Present  XI: trapezius strength  5/5  XI: sternocleidomastoid strength 5/5  XI: neck flexion strength  5/5  XII: tongue strength  Normal    Data Review Lab Results  Component Value Date   WBC 8.9 01/09/2012   HGB 11.0* 01/09/2012   HCT 34.8* 01/09/2012   MCV 87.7 01/09/2012   PLT 161 01/09/2012   Lab Results  Component Value Date   NA 144 01/09/2012   K 4.1 01/09/2012   CL 105 01/09/2012   CO2 29 01/09/2012   BUN 15 01/09/2012   CREATININE 0.73 01/09/2012   GLUCOSE 110* 01/09/2012   No results found for this basename: INR, PROTIME    Radiology: X-ray Chest Pa And Lateral   01/08/2012  *RADIOLOGY REPORT*  Clinical Data: Cough.  Generalized weakness.  Syncopal episode.  CHEST - 2 VIEW 01/08/2012:  Comparison: CT chest 02/11/2011, 12/28/2009.  Two-view chest x-ray 08/17/2010 [facility].  Findings: Cardiac silhouette upper normal in size to with  slightly enlarged but stable.  Mild thoracic aortic atherosclerosis.  Hilar and mediastinal contours otherwise unremarkable.   Lungs clear. Bronchovascular markings normal.  Pulmonary vascularity normal.  No pneumothorax.  No pleural effusions.  Degenerative changes involving the thoracic spine.  IMPRESSION: Stable borderline to mild cardiomegaly.  No acute cardiopulmonary disease.  Original Report Authenticated By: Arnell Sieving, M.D.   Mr Brain Wo Contrast  01/09/2012  *RADIOLOGY REPORT*  Clinical Data: Syncope.  Headache and dizziness  MRI HEAD WITHOUT CONTRAST  Technique:  Multiplanar, multiecho pulse sequences of the brain and surrounding structures were obtained according to standard protocol without intravenous contrast.  Comparison: CT head 11/18/2011  Findings: Negative for acute infarct.  Scattered small hyperintensities in the cerebral white matter bilaterally are typical for chronic microvascular ischemia.  Patchy hyperintensity in the pons compatible with chronic microvascular ischemia.  No cortical infarct.  Negative for hemorrhage or mass lesion.  Large disc protrusion at C3-4 with apparent compression of the cord on the left.  Cervical MRI is suggested for further evaluation.  IMPRESSION: Chronic microvascular ischemia.  No acute infarct.  Large central and  left-sided disc protrusion C3-4 with possible cord compression.  Recommend cervical MRI.  Original Report Authenticated By: Camelia Phenes, M.D.   MRI: Cervical spine MRI shows a large central disc herniation at C3-4 causing severe spinal stenosis and spinal cord compression, minor spondylosis at C4-5, moderate spondylosis and moderate canal narrowing at C5-6 and C6-7  Assessment/Plan: She has severe spinal stenosis at C3-4 for a large disc herniation with severe cord compression. This obviously did not cause her syncopal event but could be the cause of her weakness and numbness in her hands, the clumsiness in her hands, her  difficulty with gait. Physical exam suggests myelopathy. I have recommended ACDF with plating at C3-4 to take the pressure off of her spinal cord, and prevent the slow development of worsening myelopathy and paralysis, and to prevent sudden deterioration after a fall. I've explained to her the findings on the MRI and why I have recommended surgery. She has significant risk of deterioration without surgery, and while there are risks with the surgery her risk are actually much higher without surgery over time. I would want ENT to evaluate her vocal cords because of her history of vocal cord paralysis so that we could approach this from the same side as the vocal cord paralysis. I have described surgery to her in detail. She understands the typical risk include but are not limited to bleeding, infection, spinal cord injury, nerve root injury, numbness weakness paralysis, carotid artery injury, vertebral artery injury, CSF leak, esophageal or tracheal injury, laryngeal nerve injury, and anesthesia risks. She was to consider this.  I tentatively have her on the schedule for tomorrow, but she states that may be too soon for her to make this decision. I do not think she is safe for discharge because if she fell there would be high risk of injury. Also consider doing C5-6 and C6-7, but I think at this point there is moderate stenosis there and it may be better to simply take the pressure of the cord at C3-4. This will be discussed with her further prior to planning surgery.   Emileo Semel S 01/09/2012 8:43 PM

## 2012-01-09 NOTE — Progress Notes (Signed)
FMTS Attending Daily Note: Denny Levy MD [phone] pager office [phone] I  have seen and examined this patient, reviewed their chart. I have discussed this patient with the resident. I agree with the resident's findings, assessment and care plan. I reviewed both her brain and C spine MRI. Concern for the spinal compression in C spine contributing to her headaches as well as possibly her increased problems with gait. Notably, she has had increased symptoms of neuropathy in her left hand over last weeks/months which I attributed to worsening of her underlying known idiopathic . Diabetic peripheral neuropathy. Now I wonder if this is more a contributor.She has not complained of neck pain per se---unknown how long this disc material extruded---conceivably it could have occurred (or worsened) with her fall and concussion several weeks ago. I recommend we consult [facility].

## 2012-01-09 NOTE — Evaluation (Signed)
Physical Therapy Evaluation Patient Details Name: [name] [name] MRN: [id number] DOB: 1951-01-23 Today's Date: 01/09/2012 Time: 4098-1191 PT Time Calculation (min): 21 min  PT Assessment / Plan / Recommendation Clinical Impression  Pt. was admitted withsyncopal episode at the MD office, witnessed, and displays decreased independence in gait and mobility.  Of note, pt. had a concussion from a fall 6 weeks earlier when she struck her forehead.She will benefit from acute PT to address mobility and safety issues.           PT Assessment  Patient needs continued PT services    Follow Up Recommendations  Home health PT    Barriers to Discharge None      lEquipment Recommendations  None recommended by PT    Recommendations for Other Services     Frequency Min 3X/week    Precautions / Restrictions Precautions Precautions: Fall Restrictions Weight Bearing Restrictions: No   Pain Headache but did not interfere with activity     Mobility  Bed Mobility Bed Mobility: Supine to Sit Supine to Sit: 4: Min guard Details for Bed Mobility Assistance: min guard for safety Transfers Transfers: Sit to Stand;Stand to Sit Sit to Stand: 4: Min assist Stand to Sit: 4: Min assist Details for Transfer Assistance: cues for safety Ambulation/Gait Ambulation/Gait Assistance: 4: Min assist;Other (comment) (min assist with cane, min guard with RW) Ambulation Distance (Feet): 60 Feet Assistive device: Rolling walker;Straight cane Ambulation/Gait Assistance Details: Pt. with improved steadiness with use of RW Gait Pattern: Step-through pattern;Decreased step length - left Stairs: No    Exercises     PT Diagnosis: Difficulty walking;Generalized weakness  PT Problem List: Decreased strength;Decreased activity tolerance;Decreased balance;Decreased mobility;Decreased knowledge of use of DME;Pain PT Treatment Interventions: DME instruction;Gait training;Functional mobility training;Therapeutic  activities;Therapeutic exercise;Balance training;Patient/family education   PT Goals Acute Rehab PT Goals PT Goal Formulation: With patient Time For Goal Achievement: 01/23/12 Potential to Achieve Goals: Good Pt will go Supine/Side to Sit: Independently PT Goal: Supine/Side to Sit - Progress: Goal set today Pt will go Sit to Supine/Side: Independently PT Goal: Sit to Supine/Side - Progress: Goal set today Pt will go Sit to Stand: with modified independence PT Goal: Sit to Stand - Progress: Goal set today Pt will Ambulate: 51 - 150 feet;with modified independence;with rolling walker PT Goal: Ambulate - Progress: Goal set today Pt will Perform Home Exercise Program: Independently PT Goal: Perform Home Exercise Program - Progress: Goal set today  Visit Information  Last PT Received On: 01/09/12 Assistance Needed: +1    Subjective Data  Subjective: My head has been hurting since I fell on it 6 weeks agp Patient Stated Goal: resume her normal function   Prior Functioning  Home Living Lives With: Son Available Help at Discharge: Available 24 hours/day Type of Home: Apartment Home Access: Level entry Home Layout: One level Bathroom Shower/Tub: Tub/shower unit;Curtain Firefighter: Standard Home Adaptive Equipment: Clinical research associate - four wheeled Prior Function Level of Independence: Independent Driving: No Vocation: On disability Communication Communication: No difficulties Dominant Hand: Right    Cognition  Overall Cognitive Status: Appears within functional limits for tasks assessed/performed Arousal/Alertness: Awake/alert Orientation Level: Oriented X4 / Intact Behavior During Session: [facility] for tasks performed    Extremity/Trunk Assessment Right Upper Extremity Assessment RUE ROM/Strength/Tone: WFL for tasks assessed RUE Coordination: WFL - gross motor Left Upper Extremity Assessment LUE ROM/Strength/Tone: WFL for tasks assessed LUE Coordination: WFL - gross  motor Right Lower Extremity Assessment RLE ROM/Strength/Tone: Within functional levels RLE Sensation:  WFL - Light Touch RLE Coordination: WFL - gross/fine motor Left Lower Extremity Assessment LLE ROM/Strength/Tone: Deficits LLE ROM/Strength/Tone Deficits: strength in 4-/5 range and notably different than R LE LLE Sensation: WFL - Light Touch LLE Coordination: WFL - gross motor Trunk Assessment Trunk Assessment: Normal   Balance Balance Balance Assessed: Yes Static Standing Balance Single Leg Stance - Right Leg: 0  Single Leg Stance - Left Leg: 0  Dynamic Standing Balance Dynamic Standing - Balance Support: Right upper extremity supported Dynamic Standing - Level of Assistance: 4: Min assist  End of Session PT - End of Session Equipment Utilized During Treatment: Gait belt Activity Tolerance: Patient limited by fatigue Patient left: in chair;with call bell/phone within reach Nurse Communication: Mobility status   Ferman Hamming 01/09/2012, 1:31 PM Acute Rehabilitation Services [phone] (pager)

## 2012-01-09 NOTE — Progress Notes (Signed)
I discussed with  Dr Losq.  I agree with their plans documented in their progress note for today.  

## 2012-01-09 NOTE — Progress Notes (Signed)
*  PRELIMINARY RESULTS* Vascular Ultrasound Carotid Duplex (Doppler) has been completed.   No evidence of internal carotid artery stenosis bilaterally. Left external carotid artery demonstrates slightly dampened waveforms. Right vertebral artery demonstrates abnormal waverforms. Bilateral antegrade vertebral artery flow.   01/09/2012 10:53 AM Elpidio Galea, RDMS, RDCS

## 2012-01-09 NOTE — Progress Notes (Signed)
  Echocardiogram 2D Echocardiogram has been performed.  [name] [name] 01/09/2012, 11:13 AM 

## 2012-01-09 NOTE — Progress Notes (Signed)
Received order for [facility] and shower chair. Will follow up with pt . Shower chair usually requests some out of pocket cost to the pt. HH needs unclear at this time will continue to follow; Johny Shock RN MPH Case Manager [phone]

## 2012-01-09 NOTE — Progress Notes (Signed)
Family Medicine Teaching Service Daily Progress Note: pager: [phone] Subjective: Able to ambulate but still having intermittent headaches like she has been having in the last few weeks. Some dizziness worst with standing, not present with sitting.  Objective: Vital signs in last 24 hours: Temp:  [97.4 F (36.3 C)-98.5 F (36.9 C)] 98.5 F (36.9 C) (05/09 1040) Pulse Rate:  [60-96] 96  (05/09 1040) Resp:  [20] 20  (05/09 1040) BP: (148-156)/(70-82) 153/70 mmHg (05/09 1040) SpO2:  [97 %-100 %] 98 % (05/09 1040) Weight:  [180 lb 12.4 oz (82 kg)] 180 lb 12.4 oz (82 kg) (05/08 2019) Weight change:  Last BM Date: 01/09/12  Intake/Output from previous day: 05/08 0701 - 05/09 0700 In: 120 [P.O.:120] Out: 250 [Urine:250] Intake/Output this shift: Total I/O In: 240 [P.O.:240] Out: -   General appearance: alert, cooperative and no distress Resp: clear to auscultation bilaterally Cardio: S1S2, rrr, 2/6 systolic murmur GI: soft, non-tender; bowel sounds normal; no masses,  no organomegaly Extremities: extremities normal, atraumatic, no cyanosis or edema Neurologic: CN2-12 grossly intact (stable chronic nystagmus), 4+/5 strength bilaterally in upper and lower extremities, sensation to light touch intact in lower extremities.   Lab Results:  Basename 01/09/12 0530 01/08/12 1956  WBC 8.9 9.4  HGB 11.0* 10.6*  HCT 34.8* 33.5*  PLT 161 162   BMET  Basename 01/09/12 0530 01/08/12 1956  NA 144 143  K 4.1 3.9  CL 105 106  CO2 29 29  GLUCOSE 110* 136*  BUN 15 17  CREATININE 0.73 0.66  CALCIUM 9.6 9.5    Studies/Results: X-ray Chest Pa And Lateral   01/08/2012  *RADIOLOGY REPORT*  Clinical Data: Cough.  Generalized weakness.  Syncopal episode.  CHEST - 2 VIEW 01/08/2012:  Comparison: CT chest 02/11/2011, 12/28/2009.  Two-view chest x-ray 08/17/2010 [facility].  Findings: Cardiac silhouette upper normal in size to with slightly enlarged but stable.  Mild thoracic aortic  atherosclerosis.  Hilar and mediastinal contours otherwise unremarkable.   Lungs clear. Bronchovascular markings normal.  Pulmonary vascularity normal.  No pneumothorax.  No pleural effusions.  Degenerative changes involving the thoracic spine.  IMPRESSION: Stable borderline to mild cardiomegaly.  No acute cardiopulmonary disease.  Original Report Authenticated By: Arnell Sieving, M.D.   Mr Brain Wo Contrast  01/09/2012  *RADIOLOGY REPORT*  Clinical Data: Syncope.  Headache and dizziness  MRI HEAD WITHOUT CONTRAST  Technique:  Multiplanar, multiecho pulse sequences of the brain and surrounding structures were obtained according to standard protocol without intravenous contrast.  Comparison: CT head 11/18/2011  Findings: Negative for acute infarct.  Scattered small hyperintensities in the cerebral white matter bilaterally are typical for chronic microvascular ischemia.  Patchy hyperintensity in the pons compatible with chronic microvascular ischemia.  No cortical infarct.  Negative for hemorrhage or mass lesion.  Large disc protrusion at C3-4 with apparent compression of the cord on the left.  Cervical MRI is suggested for further evaluation.  IMPRESSION: Chronic microvascular ischemia.  No acute infarct.  Large central and  left-sided disc protrusion C3-4 with possible cord compression.  Recommend cervical MRI.  Original Report Authenticated By: Camelia Phenes, M.D.    Medications:  Scheduled:   . allopurinol  300 mg Oral Daily  . aspirin  325 mg Oral Daily  . azelastine  2 spray Each Nare Daily  . brimonidine  1 drop Left Eye BID  . fluticasone  2 puff Inhalation BID  . furosemide  20 mg Oral Daily  . heparin  5,000 Units Subcutaneous Q8H  . hydrochlorothiazide  12.5 mg Oral Daily  . insulin aspart  0-9 Units Subcutaneous TID WC  . insulin glargine  20 Units Subcutaneous QHS  . latanoprost  1 drop Both Eyes QHS  . losartan  50 mg Oral Daily  . metoprolol succinate  50 mg Oral BID  .  montelukast  10 mg Oral QHS  . pantoprazole  40 mg Oral Daily  . potassium chloride  20 mEq Oral Daily  . pregabalin  100 mg Oral QHS  . sertraline  150 mg Oral QHS  . simvastatin  40 mg Oral q morning - 10a  . sodium chloride  3 mL Intravenous Q12H  . sodium chloride  3 mL Intravenous Q12H  . DISCONTD: losartan-hydrochlorothiazide  0.5 tablet Oral Daily  . DISCONTD: potassium chloride  20 mEq Oral Daily   FAO:ZHYQMV chloride, albuterol, docusate sodium, nitroGLYCERIN, polyethylene glycol, sodium chloride, traMADol  Assessment/Plan: 61 yo female with fall 1 month ago who has been seen at clinic for post concussive syndrome and had witnessed syncopal event. Admitted for workup and overnight observation.  1. Syncope: unclear etiology. Brain MRI did not show any acute infarct. It showed chronic microvascular ischemia and large central and left sided disc protrusion C3-C4 with possible cord compression.  No arrhythmia on tele as possible explanation for syncope. Cardiac enzymes have been negative x2. There is no evidence of electrolyte imbalance.  - follow up echo and carotid doppler - follow TSH, vitamin D - follow up on cervical MRI - follow up PT/OT 2. Headache:possibly from post-concussive syndrome. Could also be from disc protrusion.  - follow up cervical MRI - consider neurosurgery consult  - continue tramadol for pain  3. DM- On metformin at home which has been held in case of imaging. CBG's at 95-197.  - continue sliding scale Sliding scale while inpatient in case further imaging is needed  4. HTN- Continue home regimen: lasix 20 qd, hctz 12.5 qd, losartan 50 5. HLD- Continue simvastatin 40  6. Depression- Continue zoloft for now.  7. OSA- will discuss with patient and see if she needs a CPAP machine while here.  8. GERD- Continue PPI  9. PPX- will place on heparin  10 Dispo- Pending further workup   LOS: 1 day   Zavior Thomason 01/09/2012, 11:54 AM

## 2012-01-10 ENCOUNTER — Encounter (HOSPITAL_COMMUNITY)
Admission: AD | Disposition: A | Discharge status: Skilled Nursing Facility | Payer: Self-pay | Source: Ambulatory Visit | Attending: Family Medicine

## 2012-01-10 LAB — GLUCOSE, CAPILLARY
Glucose-Capillary: 93 mg/dL (ref 70–99)
Glucose-Capillary: 192 mg/dL — ABNORMAL HIGH (ref 70–99)
Glucose-Capillary: 105 mg/dL — ABNORMAL HIGH (ref 70–99)

## 2012-01-10 LAB — BASIC METABOLIC PANEL
BUN: 14 mg/dL (ref 6–23)
CO2: 32 mEq/L (ref 19–32)
Calcium: 9.6 mg/dL (ref 8.4–10.5)
Chloride: 104 mEq/L (ref 96–112)
Creatinine, Ser: 0.68 mg/dL (ref 0.50–1.10)
GFR calc Af Amer: >90 mL/min (ref >90)
GFR calc non Af Amer: >90 mL/min (ref >90)
Glucose, Bld: 107 mg/dL — ABNORMAL HIGH (ref 70–99)
Potassium: 3.6 mEq/L (ref 3.5–5.1)
Sodium: 143 mEq/L (ref 135–145)

## 2012-01-10 LAB — DIFFERENTIAL
Basophils Absolute: 0 10*3/uL (ref 0.0–0.1)
Basophils Relative: 0 % (ref 0–1)
Eosinophils Absolute: 0.1 10*3/uL (ref 0.0–0.7)
Eosinophils Relative: 2 % (ref 0–5)
Lymphocytes Relative: 39 % (ref 12–46)
Lymphs Abs: 3.2 10*3/uL (ref 0.7–4.0)
Monocytes Absolute: 0.5 10*3/uL (ref 0.1–1.0)
Monocytes Relative: 6 % (ref 3–12)
Neutro Abs: 4.4 10*3/uL (ref 1.7–7.7)
Neutrophils Relative %: 54 % (ref 43–77)

## 2012-01-10 LAB — CBC
HCT: 33.9 % — ABNORMAL LOW (ref 36.0–46.0)
Hemoglobin: 11.2 g/dL — ABNORMAL LOW (ref 12.0–15.0)
MCH: 29 pg (ref 26.0–34.0)
MCHC: 33 g/dL (ref 30.0–36.0)
MCV: 87.8 fL (ref 78.0–100.0)
Platelets: 179 10*3/uL (ref 150–400)
RBC: 3.86 MIL/uL — ABNORMAL LOW (ref 3.87–5.11)
RDW: 14.7 % (ref 11.5–15.5)
WBC: 8.2 10*3/uL (ref 4.0–10.5)

## 2012-01-10 SURGERY — ANTERIOR CERVICAL DECOMPRESSION/DISCECTOMY FUSION 3 LEVELS
Anesthesia: General

## 2012-01-10 MED ORDER — LIDOCAINE VISCOUS 2 % MT SOLN
20.0000 mL | Freq: Once | OROMUCOSAL | Status: AC
  Administered 2012-01-11: 20 mL via OROMUCOSAL
  Filled 2012-01-10 (×2): qty 20

## 2012-01-10 SURGICAL SUPPLY — 39 items
APL SKNCLS STERI-STRIP NONHPOA (GAUZE/BANDAGES/DRESSINGS) ×1
BAG DECANTER FOR FLEXI CONT (MISCELLANEOUS) ×3 IMPLANT
BENZOIN TINCTURE PRP APPL 2/3 (GAUZE/BANDAGES/DRESSINGS) ×3 IMPLANT
CANISTER SUCTION 2500CC (MISCELLANEOUS) ×3 IMPLANT
CLOTH BEACON ORANGE TIMEOUT ST (SAFETY) ×3 IMPLANT
CONT SPEC 4OZ CLIKSEAL STRL BL (MISCELLANEOUS) ×3 IMPLANT
DRAPE C-ARM 42X72 X-RAY (DRAPES) ×6 IMPLANT
DRAPE LAPAROTOMY 100X72 PEDS (DRAPES) ×3 IMPLANT
DRAPE MICROSCOPE ZEISS OPMI (DRAPES) ×3 IMPLANT
DRAPE POUCH INSTRU U-SHP 10X18 (DRAPES) ×3 IMPLANT
DRESSING TELFA 8X3 (GAUZE/BANDAGES/DRESSINGS) ×3 IMPLANT
DRSG OPSITE 4X5.5 SM (GAUZE/BANDAGES/DRESSINGS) ×3 IMPLANT
DURAPREP 6ML APPLICATOR 50/CS (WOUND CARE) ×3 IMPLANT
ELECT COATED BLADE 2.86 ST (ELECTRODE) ×3 IMPLANT
ELECT REM PT RETURN 9FT ADLT (ELECTROSURGICAL) ×2
ELECTRODE REM PT RTRN 9FT ADLT (ELECTROSURGICAL) ×2 IMPLANT
GAUZE SPONGE 4X4 16PLY XRAY LF (GAUZE/BANDAGES/DRESSINGS) IMPLANT
GLOVE BIO SURGEON STRL SZ8 (GLOVE) ×3 IMPLANT
GOWN BRE IMP SLV AUR LG STRL (GOWN DISPOSABLE) IMPLANT
GOWN BRE IMP SLV AUR XL STRL (GOWN DISPOSABLE) ×3 IMPLANT
GOWN STRL REIN 2XL LVL4 (GOWN DISPOSABLE) IMPLANT
HEAD HALTER (SOFTGOODS) IMPLANT
KIT BASIN OR (CUSTOM PROCEDURE TRAY) ×3 IMPLANT
KIT ROOM TURNOVER OR (KITS) ×3 IMPLANT
NDL HYPO 25X1 1.5 SAFETY (NEEDLE) ×1 IMPLANT
NDL SPNL 20GX3.5 QUINCKE YW (NEEDLE) ×1 IMPLANT
NEEDLE HYPO 25X1 1.5 SAFETY (NEEDLE) ×2 IMPLANT
NEEDLE SPNL 20GX3.5 QUINCKE YW (NEEDLE) ×2 IMPLANT
NS IRRIG 1000ML POUR BTL (IV SOLUTION) ×3 IMPLANT
PAD ARMBOARD 7.5X6 YLW CONV (MISCELLANEOUS) ×3 IMPLANT
RUBBERBAND STERILE (MISCELLANEOUS) ×6 IMPLANT
SPONGE INTESTINAL PEANUT (DISPOSABLE) ×3 IMPLANT
SPONGE SURGIFOAM ABS GEL 100 (HEMOSTASIS) ×3 IMPLANT
STRIP CLOSURE SKIN 1/2X4 (GAUZE/BANDAGES/DRESSINGS) ×3 IMPLANT
SYR 20ML ECCENTRIC (SYRINGE) IMPLANT
TOWEL OR 17X24 6PK STRL BLUE (TOWEL DISPOSABLE) ×3 IMPLANT
TOWEL OR 17X26 10 PK STRL BLUE (TOWEL DISPOSABLE) ×3 IMPLANT
TRAP SPECIMEN MUCOUS 40CC (MISCELLANEOUS) IMPLANT
WATER STERILE IRR 1000ML POUR (IV SOLUTION) ×3 IMPLANT

## 2012-01-10 NOTE — Progress Notes (Signed)
I discussed with  Dr Losq.  I agree with their plans documented in their progress note for today.  

## 2012-01-10 NOTE — Progress Notes (Signed)
Patient ID: [name], female   DOB: 1951-06-08, 61 y.o.   MRN: [id number] Patient states that "today is not a good day" for her surgery, and therefore we will cancel her planned surgery for today and move it to next Wednesday. I do not feel that she is safe for discharge she is a fall risk, if she fell she would be at high risk for central cord syndrome. Plan on ACDF at C3-4 next week with possible ACDF at C5-6 and C6-7.

## 2012-01-10 NOTE — Progress Notes (Signed)
Family Medicine Teaching Service Daily Progress Note: pager: [phone] Subjective: Feeling ambivalent about surgery. Feels like she needs more time to think about it. Understands that the sooner she gets it the better.  Headache: unchanged since admission, frontal around her left eye. Comes and goes. Feeling of light headedness when she gets up and walks. No weakness, no loss of sensation Objective: Vital signs in last 24 hours: Temp:  [97.8 F (36.6 C)-98.5 F (36.9 C)] 97.8 F (36.6 C) (05/10 0500) Pulse Rate:  [60-96] 65  (05/10 0500) Resp:  [18-20] 18  (05/10 0500) BP: (143-153)/(70-72) 143/72 mmHg (05/10 0500) SpO2:  [98 %-100 %] 99 % (05/10 0500) Weight:  [177 lb 14.4 oz (80.695 kg)] 177 lb 14.4 oz (80.695 kg) (05/10 0134) Weight change: -2 lb 14 oz (-1.305 kg) Last BM Date: 01/09/12  Intake/Output from previous day: 05/09 0701 - 05/10 0700 In: 360 [P.O.:360] Out: 502 [Urine:500; Stool:2] Intake/Output this shift: Total I/O In: -  Out: 200 [Urine:200]  General appearance: alert, cooperative and no distress Resp: clear to auscultation bilaterally Cardio: S1S2, rrr, 2/6 systolic murmur GI: soft, non-tender; bowel sounds normal; no masses,  no organomegaly Extremities: extremities normal, atraumatic, no cyanosis or edema Neurologic: CN2-12 grossly intact (stable chronic nystagmus), 4+/5 strength in right upper and lower extremities, 4/5 strength in left upper and lower extremities, sensation to light touch intact in lower extremities. +3 patellar reflex on left, +1 patellar reflex on right, +2 left brachioradialis, +1 right brachioradialis.     Lab Results:  Basename 01/10/12 0530 01/09/12 0530  WBC 8.2 8.9  HGB 11.2* 11.0*  HCT 33.9* 34.8*  PLT 179 161   BMET  Basename 01/09/12 0530 01/08/12 1956  NA 144 143  K 4.1 3.9  CL 105 106  CO2 29 29  GLUCOSE 110* 136*  BUN 15 17  CREATININE 0.73 0.66  CALCIUM 9.6 9.5    Studies/Results: X-ray Chest Pa And Lateral    01/08/2012  *RADIOLOGY REPORT*  Clinical Data: Cough.  Generalized weakness.  Syncopal episode.  CHEST - 2 VIEW 01/08/2012:  Comparison: CT chest 02/11/2011, 12/28/2009.  Two-view chest x-ray 08/17/2010 [facility].  Findings: Cardiac silhouette upper normal in size to with slightly enlarged but stable.  Mild thoracic aortic atherosclerosis.  Hilar and mediastinal contours otherwise unremarkable.   Lungs clear. Bronchovascular markings normal.  Pulmonary vascularity normal.  No pneumothorax.  No pleural effusions.  Degenerative changes involving the thoracic spine.  IMPRESSION: Stable borderline to mild cardiomegaly.  No acute cardiopulmonary disease.  Original Report Authenticated By: Arnell Sieving, M.D.   Mr Brain Wo Contrast  01/09/2012  *RADIOLOGY REPORT*  Clinical Data: Syncope.  Headache and dizziness  MRI HEAD WITHOUT CONTRAST  Technique:  Multiplanar, multiecho pulse sequences of the brain and surrounding structures were obtained according to standard protocol without intravenous contrast.  Comparison: CT head 11/18/2011  Findings: Negative for acute infarct.  Scattered small hyperintensities in the cerebral white matter bilaterally are typical for chronic microvascular ischemia.  Patchy hyperintensity in the pons compatible with chronic microvascular ischemia.  No cortical infarct.  Negative for hemorrhage or mass lesion.  Large disc protrusion at C3-4 with apparent compression of the cord on the left.  Cervical MRI is suggested for further evaluation.  IMPRESSION: Chronic microvascular ischemia.  No acute infarct.  Large central and  left-sided disc protrusion C3-4 with possible cord compression.  Recommend cervical MRI.  Original Report Authenticated By: Camelia Phenes, M.D.   Mr Cervical  Spine W Wo Contrast  01/09/2012  *RADIOLOGY REPORT*  Clinical Data: Abnormal gait with falls.  MRI CERVICAL SPINE WITHOUT AND WITH CONTRAST  Technique:  Multiplanar and multiecho pulse sequences of the  cervical spine, to include the craniocervical junction and cervicothoracic junction, were obtained according to standard protocol without and with intravenous contrast.  Contrast: 17mL MULTIHANCE GADOBENATE DIMEGLUMINE 529 MG/ML IV SOLN  Comparison: MRI head 01/08/2012  Findings: Normal cervical alignment with mild kyphosis present. Negative for fracture or mass lesion.  No enhancing lesions are identified.  C2-3:  Disc degeneration with mild spurring and mild spinal stenosis.  C3-4:  Large extruded disc fragment central and  left-sided with upgoing disc material.  This is causing marked compression of the spinal cord on the left.  There is cord edema present.  C4-5:  Disc degeneration and spondylosis with flattening of the cord and moderate spinal stenosis.  Foraminal narrowing bilaterally due to spurring.  C5-6:  Central disc and osteophyte causing mild cord compression and moderate spinal stenosis.  Foraminal narrowing bilaterally due to spurring.  C6-7:  Disc degeneration with spondylosis.  There is cord flattening and moderate spinal stenosis.  Foraminal stenosis present bilaterally due to spurring. Chronic-appearing hyperintensity in the spinal cord is present.  C7-T1:  Negative  IMPRESSION: Severe multilevel degenerative change in the cervical spine.  Large extruded disc fragment at C3-4 with upgoing disc material. There is cord compression and cord edema.  Moderate spinal stenosis at C4-5, C5-6 and C6-7.  I reviewed the findings in person with Dr. Berline Chough on 01/08/2012 at 1640 hours.  Original Report Authenticated By: Camelia Phenes, M.D.    Medications:  Scheduled:    . allopurinol  300 mg Oral Daily  . aspirin  325 mg Oral Daily  . azelastine  2 spray Each Nare Daily  . brimonidine  1 drop Left Eye BID  . fluticasone  2 puff Inhalation BID  . furosemide  20 mg Oral Daily  . gadobenate dimeglumine  17 mL Intravenous Once  . heparin  5,000 Units Subcutaneous Q8H  . hydrochlorothiazide  12.5 mg  Oral Daily  . insulin aspart  0-9 Units Subcutaneous TID WC  . insulin glargine  20 Units Subcutaneous QHS  . latanoprost  1 drop Both Eyes QHS  . losartan  50 mg Oral Daily  . metoprolol succinate  50 mg Oral BID  . montelukast  10 mg Oral QHS  . pantoprazole  40 mg Oral Daily  . potassium chloride  20 mEq Oral Daily  . pregabalin  100 mg Oral QHS  . sertraline  150 mg Oral QHS  . simvastatin  40 mg Oral q morning - 10a  . sodium chloride  3 mL Intravenous Q12H  . sodium chloride  3 mL Intravenous Q12H  . Vitamin D (Ergocalciferol)  50,000 Units Oral Q7 days  . DISCONTD: potassium chloride  20 mEq Oral Daily   XBJ:YNWGNF chloride, albuterol, docusate sodium, nitroGLYCERIN, polyethylene glycol, sodium chloride, traMADol  Assessment/Plan: 61 yo female with fall 1 month ago who has been seen at clinic for post concussive syndrome and had witnessed syncopal event. Admitted for workup and overnight observation.  1. Left sided cord compression at C3-C4: neurosurgery recommends ACDF with plating of C3-C4 but will not be doing it today since patient doesn't feel ready. Surgery to be scheduled for Wednesday.  2. Syncope: unclear etiology. Brain MRI did not show any acute infarct. It showed chronic microvascular ischemia and large central and left  sided disc protrusion C3-C4 with possible cord compression which would not be cause for syncopal event.  No arrhythmia on tele as possible explanation for syncope. Cardiac enzymes have been negative x2. There is no evidence of electrolyte imbalance. Echo shows EF: 55-60% with mild mitral regurgitation and aortic sclerosis which should not cause syncope. TSH was normal at 1.325 PT recommends home health PT.   - continue monitoring.   3. DM- On metformin at home which has been held in case of imaging. CBG's at 93-166  - continue sliding scale Sliding scale while inpatient in case further imaging is needed  4. HTN- Continue home regimen: lasix 20 qd, hctz  12.5 qd, losartan 50. Consider increasing as outpatient since BP has been elevated to 153. 5. HLD- Continue simvastatin 40  6. Depression- Continue zoloft for now.  7. OSA- will discuss with patient and see if she needs a CPAP machine while here.  8. GERD- Continue PPI  9. PPX- will place on heparin  10 Dispo- Pending further workup   LOS: 2 days   Vara Mairena 01/10/2012, 6:35 AM

## 2012-01-10 NOTE — Progress Notes (Signed)
PT NOTE  01/10/12 1108  PT Visit Information  Last PT Received On 01/10/12  PT - Assessment/Plan  Comments on Treatment Session Noted results of MRI and Dr. Yetta Barre" note regarding pending surgery if pt. is agreeable.  Will hold PT today and have placed call to Dr. Yetta Barre' office.  Left message on Vanessa's answering machine requesting clarification.  Will await response.   Weldon Picking PT Acute Rehab Services [phone] [place] [phone]

## 2012-01-10 NOTE — Progress Notes (Signed)
Clinical Social Worker (CSW) received a referral for possible SNF placement pending surgery. This CSW will await a PT evaluation to determine pt disposition.  Theresia Bough, MSW, Theresia Majors [phone]

## 2012-01-10 NOTE — Progress Notes (Signed)
Report called to K on 3000.  Patient transferred on teley to a teley bed.  MD called to clarify transfer order and patient transferred, resting comfortably in room 3028.

## 2012-01-11 LAB — GLUCOSE, CAPILLARY
Glucose-Capillary: 133 mg/dL — ABNORMAL HIGH (ref 70–99)
Glucose-Capillary: 108 mg/dL — ABNORMAL HIGH (ref 70–99)
Glucose-Capillary: 156 mg/dL — ABNORMAL HIGH (ref 70–99)
Glucose-Capillary: 126 mg/dL — ABNORMAL HIGH (ref 70–99)

## 2012-01-11 NOTE — Progress Notes (Signed)
[name] [name], [name] [name] [id number] September 08, 1950  Reason for Consult:  Possible VC paralysis Requesting Physician:  Nestor Ramp, MD   HPI:  61 yo bf, DM.  Admitted for syncope, weakness in extremities, dizziness.  On schedule for cervical fusion this week.  She relates a history of possible VC paralysis.  Details are very fuzzy.  She had Botox injection per Dr. 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  Does not recall hoarseness before or after that procedure.  No further swallowing difficulty over many years.  Nobreathing issues.  Voice seems OK to her.  No heart or chest surgery.  No thryoid surgery.  ROS:  Negative except as in HPI.  Past Medical History  Diagnosis Date  . Chronic cough     Now followed by pulmonary  . Sleep apnea     on CPAP 12  . HTN (hypertension)   . Dyslipidemia   . Gout   . History of colonoscopy   . Concussion 11/18/11    "fell at dr's office; hit head"  . Complication of anesthesia     "just wake up coughing; that's all"  . Heart murmur   . Asthma   . COPD (chronic obstructive pulmonary disease)   . Diabetes mellitus type II     "take insulin & pills"  . Blood transfusion   . GERD (gastroesophageal reflux disease)   . Arthritis   . Chronic lower back pain   . Vocal cord paralysis, unilateral partial     "right"  . Depression   . Peripheral neuropathy   . Angina   . Pneumonia     "i've had it once" (11/18/11)  . Exertional dyspnea   . Headache 11/18/11    "I've had mild headaches the last couple days"  . Headache 01/08/12    "pretty constant since 11/18/11's concussion"    Allergies  Allergen Reactions  . Amlodipine Besylate Swelling  . Lisinopril-Hydrochlorothiazide Cough    REACTION: cough-- changed to arb  . Propoxyphene Hcl Itching  . Valsartan Other (See Comments)    REACTION:  "sleep more the next day after I took it; it made me real tired"  . Metronidazole     REACTION: "just didn't work; my body never did heal from  it"    Scheduled Medications:    . allopurinol  300 mg Oral Daily  . aspirin  325 mg Oral Daily  . azelastine  2 spray Each Nare Daily  . brimonidine  1 drop Left Eye BID  . fluticasone  2 puff Inhalation BID  . furosemide  20 mg Oral Daily  . heparin  5,000 Units Subcutaneous Q8H  . hydrochlorothiazide  12.5 mg Oral Daily  . insulin aspart  0-9 Units Subcutaneous TID WC  . insulin glargine  20 Units Subcutaneous QHS  . latanoprost  1 drop Both Eyes QHS  . lidocaine  20 mL Mouth/Throat Once  . losartan  50 mg Oral Daily  . metoprolol succinate  50 mg Oral BID  . montelukast  10 mg Oral QHS  . pantoprazole  40 mg Oral Daily  . potassium chloride  20 mEq Oral Daily  . pregabalin  100 mg Oral QHS  . sertraline  150 mg Oral QHS  . simvastatin  40 mg Oral q morning - 10a  . sodium chloride  3 mL Intravenous Q12H  . sodium chloride  3 mL Intravenous Q12H  . Vitamin D (Ergocalciferol)  50,000 Units Oral Q7 days  PRN Meds: sodium chloride, albuterol, docusate sodium, nitroGLYCERIN, polyethylene glycol, sodium chloride, traMADol  Infusions:     Physical Exam:  Filed Vitals:   01/11/12 1000  BP: 132/76  Pulse: 60  Temp: 98.7 F (37.1 C)  Resp: 18    Short, chubby.  Thick glasses.  Mental status good.  Breathing comfortably.  Voice sl raspy on occasion, but not diplophonic or breathy.  With informed consent, flex scope used.  5 ml 1% viscous xylocaine per both nares.  Larynx is entirely normal, with full mobility of both vocal cords.  IMPRESSION:  Normal larynx  PLAN:   Proceed with Neurosurgery, with side of approach per their election.  Call for further assistance.    Flo Shanks 01/11/2012, 1:27 PM

## 2012-01-11 NOTE — Progress Notes (Signed)
Family Medicine Teaching Service Daily Progress Note: pager: [phone] Subjective: Surgery scheduled for Wednesday. Still having some headache, unchanged from admission.  No increased weakness or loss of sensation.  Objective: Vital signs in last 24 hours: Temp:  [97.5 F (36.4 C)-98.7 F (37.1 C)] 98.7 F (37.1 C) (05/11 1000) Pulse Rate:  [60-76] 60  (05/11 1000) Resp:  [18] 18  (05/11 1000) BP: (114-147)/(53-78) 132/76 mmHg (05/11 1000) SpO2:  [94 %-98 %] 98 % (05/11 1000) Weight:  [174 lb 2.6 oz (79 kg)-177 lb 11.1 oz (80.6 kg)] 174 lb 2.6 oz (79 kg) (05/10 2200) Weight change: -3.4 oz (-0.095 kg) Last BM Date: 01/10/12  Intake/Output from previous day: 05/10 0701 - 05/11 0700 In: 603 [P.O.:600; I.V.:3] Out: 301 [Urine:300; Stool:1] Intake/Output this shift: Total I/O In: -  Out: 1 [Urine:1]  General appearance: alert, cooperative and no distress Resp: clear to auscultation bilaterally Cardio: S1S2, rrr, 2/6 systolic murmur GI: soft, non-tender; bowel sounds normal; no masses,  no organomegaly Extremities: extremities normal, atraumatic, no cyanosis or edema Neurologic: CN2-12 grossly intact (stable chronic nystagmus), 4+/5 strength in right upper and lower extremities, 4/5 strength in left upper and lower extremities, sensation to light touch intact in lower extremities. +3 patellar reflex on left, +1 patellar reflex on right, +2 left brachioradialis, +1 right brachioradialis.     Lab Results:  Basename 01/10/12 0530 01/09/12 0530  WBC 8.2 8.9  HGB 11.2* 11.0*  HCT 33.9* 34.8*  PLT 179 161   BMET  Basename 01/10/12 0530 01/09/12 0530  NA 143 144  K 3.6 4.1  CL 104 105  CO2 32 29  GLUCOSE 107* 110*  BUN 14 15  CREATININE 0.68 0.73  CALCIUM 9.6 9.6    Studies/Results: X-ray Chest Pa And Lateral   01/08/2012  *RADIOLOGY REPORT*  Clinical Data: Cough.  Generalized weakness.  Syncopal episode.  CHEST - 2 VIEW 01/08/2012:  Comparison: CT chest 02/11/2011,  12/28/2009.  Two-view chest x-ray 08/17/2010 [facility].  Findings: Cardiac silhouette upper normal in size to with slightly enlarged but stable.  Mild thoracic aortic atherosclerosis.  Hilar and mediastinal contours otherwise unremarkable.   Lungs clear. Bronchovascular markings normal.  Pulmonary vascularity normal.  No pneumothorax.  No pleural effusions.  Degenerative changes involving the thoracic spine.  IMPRESSION: Stable borderline to mild cardiomegaly.  No acute cardiopulmonary disease.  Original Report Authenticated By: Arnell Sieving, M.D.   Mr Brain Wo Contrast  01/09/2012  *RADIOLOGY REPORT*  Clinical Data: Syncope.  Headache and dizziness  MRI HEAD WITHOUT CONTRAST  Technique:  Multiplanar, multiecho pulse sequences of the brain and surrounding structures were obtained according to standard protocol without intravenous contrast.  Comparison: CT head 11/18/2011  Findings: Negative for acute infarct.  Scattered small hyperintensities in the cerebral white matter bilaterally are typical for chronic microvascular ischemia.  Patchy hyperintensity in the pons compatible with chronic microvascular ischemia.  No cortical infarct.  Negative for hemorrhage or mass lesion.  Large disc protrusion at C3-4 with apparent compression of the cord on the left.  Cervical MRI is suggested for further evaluation.  IMPRESSION: Chronic microvascular ischemia.  No acute infarct.  Large central and  left-sided disc protrusion C3-4 with possible cord compression.  Recommend cervical MRI.  Original Report Authenticated By: Camelia Phenes, M.D.   Mr Cervical Spine W Wo Contrast  01/09/2012  *RADIOLOGY REPORT*  Clinical Data: Abnormal gait with falls.  MRI CERVICAL SPINE WITHOUT AND WITH CONTRAST  Technique:  Multiplanar  and multiecho pulse sequences of the cervical spine, to include the craniocervical junction and cervicothoracic junction, were obtained according to standard protocol without and with intravenous  contrast.  Contrast: 17mL MULTIHANCE GADOBENATE DIMEGLUMINE 529 MG/ML IV SOLN  Comparison: MRI head 01/08/2012  Findings: Normal cervical alignment with mild kyphosis present. Negative for fracture or mass lesion.  No enhancing lesions are identified.  C2-3:  Disc degeneration with mild spurring and mild spinal stenosis.  C3-4:  Large extruded disc fragment central and  left-sided with upgoing disc material.  This is causing marked compression of the spinal cord on the left.  There is cord edema present.  C4-5:  Disc degeneration and spondylosis with flattening of the cord and moderate spinal stenosis.  Foraminal narrowing bilaterally due to spurring.  C5-6:  Central disc and osteophyte causing mild cord compression and moderate spinal stenosis.  Foraminal narrowing bilaterally due to spurring.  C6-7:  Disc degeneration with spondylosis.  There is cord flattening and moderate spinal stenosis.  Foraminal stenosis present bilaterally due to spurring. Chronic-appearing hyperintensity in the spinal cord is present.  C7-T1:  Negative  IMPRESSION: Severe multilevel degenerative change in the cervical spine.  Large extruded disc fragment at C3-4 with upgoing disc material. There is cord compression and cord edema.  Moderate spinal stenosis at C4-5, C5-6 and C6-7.  I reviewed the findings in person with Dr. Berline Chough on 01/08/2012 at 1640 hours.  Original Report Authenticated By: Camelia Phenes, M.D.    Medications:  Scheduled:    . allopurinol  300 mg Oral Daily  . aspirin  325 mg Oral Daily  . azelastine  2 spray Each Nare Daily  . brimonidine  1 drop Left Eye BID  . fluticasone  2 puff Inhalation BID  . furosemide  20 mg Oral Daily  . heparin  5,000 Units Subcutaneous Q8H  . hydrochlorothiazide  12.5 mg Oral Daily  . insulin aspart  0-9 Units Subcutaneous TID WC  . insulin glargine  20 Units Subcutaneous QHS  . latanoprost  1 drop Both Eyes QHS  . lidocaine  20 mL Mouth/Throat Once  . losartan  50 mg Oral  Daily  . metoprolol succinate  50 mg Oral BID  . montelukast  10 mg Oral QHS  . pantoprazole  40 mg Oral Daily  . potassium chloride  20 mEq Oral Daily  . pregabalin  100 mg Oral QHS  . sertraline  150 mg Oral QHS  . simvastatin  40 mg Oral q morning - 10a  . sodium chloride  3 mL Intravenous Q12H  . sodium chloride  3 mL Intravenous Q12H  . Vitamin D (Ergocalciferol)  50,000 Units Oral Q7 days   UEA:VWUJWJ chloride, albuterol, docusate sodium, nitroGLYCERIN, polyethylene glycol, sodium chloride, traMADol  Assessment/Plan: 61 yo female with fall 1 month ago who has been seen at clinic for post concussive syndrome and had witnessed syncopal event. 1. Left sided cord compression at C3-C4: neurosurgery recommends ACDF with plating of C3-C4 - surgery on Wednesday  - ENT to evaluate her today to check vocal cords as to allow for easier intubation for surgery.  2. Syncope: unclear etiology. Brain MRI did not show any acute infarct. It showed chronic microvascular ischemia and large central and left sided disc protrusion C3-C4 with possible cord compression which would not be cause for syncopal event. Cardiac enzymes have been negative x2. There is no evidence of electrolyte imbalance. Echo shows EF: 55-60% with mild mitral regurgitation and aortic sclerosis which  should not cause syncope. TSH was normal at 1.325 - No arrhythmias since admission. Can likely be off tele.  - continue PT  - check orthostatics 3. DM- On metformin at home which has been held in case of imaging. CBG's at 105-133 - continue sliding scale Sliding scale while inpatient in case further imaging is needed  4. HTN- stable. Continue home regimen: lasix 20 qd, hctz 12.5 qd, losartan 50.  5. HLD- Continue simvastatin 40  6. Depression- Continue zoloft for now.  7. OSA- will discuss with patient and see if she needs a CPAP machine while here.  8. GERD- Continue PPI  9. PPX- will place on heparin  10 Dispo- Pending further  workup   LOS: 3 days   LOSQ, STEPHANIE 01/11/2012, 11:42 AM

## 2012-01-12 LAB — BASIC METABOLIC PANEL
BUN: 21 mg/dL (ref 6–23)
CO2: 28 mEq/L (ref 19–32)
Calcium: 9.9 mg/dL (ref 8.4–10.5)
Chloride: 102 mEq/L (ref 96–112)
Creatinine, Ser: 0.7 mg/dL (ref 0.50–1.10)
GFR calc Af Amer: >90 mL/min (ref >90)
GFR calc non Af Amer: >90 mL/min (ref >90)
Glucose, Bld: 106 mg/dL — ABNORMAL HIGH (ref 70–99)
Potassium: 3.9 mEq/L (ref 3.5–5.1)
Sodium: 142 mEq/L (ref 135–145)

## 2012-01-12 LAB — CBC
HCT: 39.6 % (ref 36.0–46.0)
Hemoglobin: 12.6 g/dL (ref 12.0–15.0)
MCH: 28.1 pg (ref 26.0–34.0)
MCHC: 31.8 g/dL (ref 30.0–36.0)
MCV: 88.4 fL (ref 78.0–100.0)
Platelets: 214 10*3/uL (ref 150–400)
RBC: 4.48 MIL/uL (ref 3.87–5.11)
RDW: 14.9 % (ref 11.5–15.5)
WBC: 9.7 10*3/uL (ref 4.0–10.5)

## 2012-01-12 LAB — GLUCOSE, CAPILLARY
Glucose-Capillary: 114 mg/dL — ABNORMAL HIGH (ref 70–99)
Glucose-Capillary: 130 mg/dL — ABNORMAL HIGH (ref 70–99)
Glucose-Capillary: 98 mg/dL (ref 70–99)
Glucose-Capillary: 138 mg/dL — ABNORMAL HIGH (ref 70–99)

## 2012-01-12 MED ORDER — POLYETHYLENE GLYCOL 3350 17 G PO PACK
17.0000 g | PACK | Freq: Every day | ORAL | Status: DC
  Administered 2012-01-12 – 2012-01-20 (×7): 17 g via ORAL
  Filled 2012-01-12 (×10): qty 1

## 2012-01-12 MED ORDER — GLYCERIN (LAXATIVE) 2.1 G RE SUPP
1.0000 | Freq: Once | RECTAL | Status: AC
  Administered 2012-01-12: 2.1 via RECTAL
  Filled 2012-01-12: qty 1

## 2012-01-12 MED ORDER — ACYCLOVIR 200 MG PO CAPS
400.0000 mg | ORAL_CAPSULE | Freq: Two times a day (BID) | ORAL | Status: DC
  Administered 2012-01-12 – 2012-01-21 (×18): 400 mg via ORAL
  Filled 2012-01-12 (×20): qty 2

## 2012-01-12 NOTE — Progress Notes (Signed)
Subjective: No acute events overnight.  Patient has no complaints at this time.  She is requesting her home medication of Acyclovir 400 BID.  Denies any active herpetic lesions, but does endorse groin pain.  Denies any chest, neck, or low back pain currently.  She is sitting in a chair in no acute distress.  Objective: Vital signs in last 24 hours: Temp:  [97.1 F (36.2 C)-97.9 F (36.6 C)] 97.1 F (36.2 C) (05/12 0535) Pulse Rate:  [62-84] 63  (05/12 0535) Resp:  [18-19] 18  (05/12 0535) BP: (95-114)/(56-71) 114/71 mmHg (05/12 0535) SpO2:  [94 %-99 %] 98 % (05/12 0922) Weight change:  Last BM Date: 01/10/12  Intake/Output from previous day: 05/11 0701 - 05/12 0700 In: -  Out: 1 [Urine:1] Intake/Output this shift:   Physical Exam: General appearance: alert, cooperative and no distress Resp: clear to auscultation bilaterally Cardio: S1S2, rrr, 2/6 systolic murmur GI: soft, non-tender; bowel sounds normal; no masses,  no organomegaly Extremities: extremities normal, atraumatic, no cyanosis or edema Neurologic: CN2-12 grossly intact (stable chronic nystagmus)  Lab Results:  [facility] 01/12/12 0613 01/10/12 0530  WBC 9.7 8.2  HGB 12.6 11.2*  HCT 39.6 33.9*  PLT 214 179   BMET  Basename 01/12/12 0613 01/10/12 0530  NA 142 143  K 3.9 3.6  CL 102 104  CO2 28 32  GLUCOSE 106* 107*  BUN 21 14  CREATININE 0.70 0.68  CALCIUM 9.9 9.6    Studies/Results: No results found.  Medications: I have reviewed the patient's current medications.  Assessment/Plan: 61 yo female with fall 1 month ago who has been seen at clinic for post concussive syndrome and had witnessed syncopal event.  # Left sided cord compression at C3-C4: neurosurgery recommends ACDF with plating of C3-C4.  ENT evaluated for remote hx Vocal cord paralysis and was cleared for neurosurgery.  Thank you for your consult. - Plan for surgery Wednesday 5/15  # Syncope: unclear etiology. Brain MRI did not show any  acute infarct. It showed chronic microvascular ischemia and large central and left sided disc protrusion C3-C4 with possible cord compression which would not be cause for syncopal event. Cardiac enzymes have been negative x2. There is no evidence of electrolyte imbalance. Echo shows EF: 55-60% with mild mitral regurgitation and aortic sclerosis which should not cause syncope. TSH was normal at 1.325 - No arrhythmias since admission.  Will D/C tele.  - continue PT  - Follow up orthostatics  # DM- On metformin at home which has been held in case of imaging.  - continue sliding scale Sliding scale while inpatient in case further imaging is needed   CBG (last 3)   Basename 01/12/12 0658 01/11/12 2115 01/11/12 1638  GLUCAP 114* 126* 156*    # HTN- stable. Continue home regimen: lasix 20 qd, hctz 12.5 qd, losartan 50.  # HLD- Continue simvastatin 40  #Depression- Continue Zoloft. # Herpes genitales: Restart Acyclovir 400 BID. # OSA-  Has not needed CPAP in house. # GERD- Continue PPI  # PPX-  Heparin SQ for DVT Prophylaxis. # Dispo- Pending further workup.   LOS: 4 days   DE LA CRUZ,Mak Bonny 01/12/2012, 11:17 AM

## 2012-01-12 NOTE — Progress Notes (Signed)
I interviewed and examined this patient and discussed the care plan with Dr. Tye Savoy and the FPTS team and agree with assessment and plan as documented in the progress note for today.    Demarquis Osley A. Sheffield Slider, MD Family Medicine Teaching Service Attending  01/12/2012 3:28 PM

## 2012-01-13 LAB — GLUCOSE, CAPILLARY
Glucose-Capillary: 99 mg/dL (ref 70–99)
Glucose-Capillary: 100 mg/dL — ABNORMAL HIGH (ref 70–99)
Glucose-Capillary: 141 mg/dL — ABNORMAL HIGH (ref 70–99)

## 2012-01-13 NOTE — Progress Notes (Signed)
Subjective: No acute events overnight. Headache is unchanged and still present.   Objective: Vital signs in last 24 hours: Temp:  [97.5 F (36.4 C)-97.8 F (36.6 C)] 97.7 F (36.5 C) (05/13 0609) Pulse Rate:  [61-69] 61  (05/13 0609) Resp:  [18-19] 19  (05/13 0609) BP: (103-120)/(52-75) 118/52 mmHg (05/13 0609) SpO2:  [97 %-100 %] 100 % (05/13 0609) Weight change:  Last BM Date: 01/10/12  Intake/Output from previous day:   Intake/Output this shift:   Physical Exam: General appearance: alert, cooperative and no distress Resp: clear to auscultation bilaterally Cardio: S1S2, rrr, 2/6 systolic murmur GI: soft, non-tender; bowel sounds normal; no masses,  no organomegaly Extremities: extremities normal, atraumatic, no cyanosis or edema Neurologic: CN2-12 grossly intact (stable chronic nystagmus), 3+ patellar reflex in left leg, 2+ patellar reflex in right. 3+ brachioradialis bilaterally.   Lab Results:  [facility] 01/12/12 0613  WBC 9.7  HGB 12.6  HCT 39.6  PLT 214   BMET  Basename 01/12/12 0613  NA 142  K 3.9  CL 102  CO2 28  GLUCOSE 106*  BUN 21  CREATININE 0.70  CALCIUM 9.9    Studies/Results: No results found.  Medications:     . acyclovir  400 mg Oral BID  . allopurinol  300 mg Oral Daily  . aspirin  325 mg Oral Daily  . azelastine  2 spray Each Nare Daily  . brimonidine  1 drop Left Eye BID  . fluticasone  2 puff Inhalation BID  . furosemide  20 mg Oral Daily  . Glycerin (Adult)  1 suppository Rectal Once  . heparin  5,000 Units Subcutaneous Q8H  . hydrochlorothiazide  12.5 mg Oral Daily  . insulin aspart  0-9 Units Subcutaneous TID WC  . insulin glargine  20 Units Subcutaneous QHS  . latanoprost  1 drop Both Eyes QHS  . losartan  50 mg Oral Daily  . metoprolol succinate  50 mg Oral BID  . montelukast  10 mg Oral QHS  . pantoprazole  40 mg Oral Daily  . polyethylene glycol  17 g Oral Daily  . potassium chloride  20 mEq Oral Daily  . pregabalin   100 mg Oral QHS  . sertraline  150 mg Oral QHS  . simvastatin  40 mg Oral q morning - 10a  . sodium chloride  3 mL Intravenous Q12H  . sodium chloride  3 mL Intravenous Q12H  . Vitamin D (Ergocalciferol)  50,000 Units Oral Q7 days    Assessment/Plan: 61 yo female with fall 1 month ago who has been seen at clinic for post concussive syndrome and had witnessed syncopal event.  # Left sided cord compression at C3-C4: neurosurgery recommends ACDF with plating of C3-C4.  ENT evaluated for remote hx Vocal cord paralysis and was cleared for neurosurgery.  - Plan for surgery Wednesday 5/15  # Syncope: unclear etiology. Brain MRI did not show any acute infarct. It showed chronic microvascular ischemia and large central and left sided disc protrusion C3-C4 with possible cord compression which would not be cause for syncopal event. Cardiac enzymes have been negative x2. There is no evidence of electrolyte imbalance. Echo shows EF: 55-60% with mild mitral regurgitation and aortic sclerosis which should not cause syncope. TSH was normal at 1.325. Patient not orthostatic.  - continue PT: if ok per neurosurgery.   # DM- On metformin at home which has been held in case of imaging. CBG's 98-138 - continue sliding scale Sliding scale while inpatient in  case further imaging is needed   CBG (last 3)   Basename 01/12/12 2110 01/12/12 1632 01/12/12 1156  GLUCAP 138* 98 130*    # HTN- stable. Continue home regimen: lasix 20 qd, hctz 12.5 qd, losartan 50.  # HLD- Continue simvastatin 40  #Depression- Continue Zoloft. 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 No evidence of lesions, per patient.  # OSA-  Has not needed CPAP in house. # GERD- Continue PPI  # PPX-  Heparin SQ for DVT Prophylaxis. # Dispo- Pending surgery   LOS: 5 days   Homero Hyson 01/13/2012, 11:29 AM

## 2012-01-13 NOTE — Progress Notes (Signed)
PT Cancellation Note  Treatment cancelled today due to awaiting for MD approval to continue PT with patient. Once okay received to continue to see PT, patient had already ambulated with nurse and had gotten back into bed. Patient politely declining ambulation this afternoon but stated that she would walk again with nursing after dinner. Will check on patient tomorrow. Thanks 01/13/2012 Fredrich Birks PTA [phone] pager [phone] office     Fredrich Birks 01/13/2012, 3:01 PM

## 2012-01-13 NOTE — Progress Notes (Signed)
FMTS Attending Note  Patient seen and examined by me today, I have discussed the assessment and plan with resident team and I agree with Dr. Whitney Muse assessment as detailed in this note. Paula Compton, MD

## 2012-01-14 LAB — BASIC METABOLIC PANEL
BUN: 21 mg/dL (ref 6–23)
CO2: 29 mEq/L (ref 19–32)
Calcium: 9.7 mg/dL (ref 8.4–10.5)
Chloride: 105 mEq/L (ref 96–112)
Creatinine, Ser: 0.75 mg/dL (ref 0.50–1.10)
GFR calc Af Amer: >90 mL/min (ref >90)
GFR calc non Af Amer: >90 mL/min (ref >90)
Glucose, Bld: 105 mg/dL — ABNORMAL HIGH (ref 70–99)
Potassium: 4 mEq/L (ref 3.5–5.1)
Sodium: 142 mEq/L (ref 135–145)

## 2012-01-14 LAB — PROTIME-INR
INR: 0.94 (ref 0.00–1.49)
Prothrombin Time: 12.8 seconds (ref 11.6–15.2)

## 2012-01-14 LAB — GLUCOSE, CAPILLARY
Glucose-Capillary: 110 mg/dL — ABNORMAL HIGH (ref 70–99)
Glucose-Capillary: 121 mg/dL — ABNORMAL HIGH (ref 70–99)
Glucose-Capillary: 122 mg/dL — ABNORMAL HIGH (ref 70–99)
Glucose-Capillary: 116 mg/dL — ABNORMAL HIGH (ref 70–99)

## 2012-01-14 LAB — CBC
HCT: 36.2 % (ref 36.0–46.0)
Hemoglobin: 11.3 g/dL — ABNORMAL LOW (ref 12.0–15.0)
MCH: 28 pg (ref 26.0–34.0)
MCHC: 31.2 g/dL (ref 30.0–36.0)
MCV: 89.6 fL (ref 78.0–100.0)
Platelets: 187 10*3/uL (ref 150–400)
RBC: 4.04 MIL/uL (ref 3.87–5.11)
RDW: 15.1 % (ref 11.5–15.5)
WBC: 8.7 10*3/uL (ref 4.0–10.5)

## 2012-01-14 LAB — DIFFERENTIAL
Basophils Absolute: 0 10*3/uL (ref 0.0–0.1)
Basophils Relative: 0 % (ref 0–1)
Eosinophils Absolute: 0.1 10*3/uL (ref 0.0–0.7)
Eosinophils Relative: 1 % (ref 0–5)
Lymphocytes Relative: 40 % (ref 12–46)
Lymphs Abs: 3.5 10*3/uL (ref 0.7–4.0)
Monocytes Absolute: 0.5 10*3/uL (ref 0.1–1.0)
Monocytes Relative: 6 % (ref 3–12)
Neutro Abs: 4.5 10*3/uL (ref 1.7–7.7)
Neutrophils Relative %: 52 % (ref 43–77)

## 2012-01-14 MED ORDER — BISACODYL 10 MG RE SUPP
10.0000 mg | Freq: Once | RECTAL | Status: AC
  Administered 2012-01-14: 10 mg via RECTAL
  Filled 2012-01-14: qty 1

## 2012-01-14 NOTE — Progress Notes (Signed)
Subjective: No acute events overnight. Headache stable.   Objective: Vital signs in last 24 hours: Temp:  [97.4 F (36.3 C)-98.2 F (36.8 C)] 97.4 F (36.3 C) (05/14 1000) Pulse Rate:  [64-69] 69  (05/14 1000) Resp:  [17-18] 18  (05/14 1000) BP: (100-118)/(50-76) 118/55 mmHg (05/14 1000) SpO2:  [95 %-97 %] 97 % (05/14 1000) Weight change:  Last BM Date: 01/13/12  Intake/Output from previous day: 05/13 0701 - 05/14 0700 In: -  Out: 1 [Urine:1] Intake/Output this shift: Total I/O In: -  Out: 1 [Urine:1] Physical Exam: General appearance: alert, cooperative and no distress Resp: clear to auscultation bilaterally Cardio: S1S2, rrr, 2/6 systolic murmur GI: soft, non-tender; bowel sounds normal; no masses,  no organomegaly Extremities: extremities normal, atraumatic, no cyanosis or edema Neurologic: CN2-12 grossly intact (stable chronic nystagmus), decreased strength in left hand and left lower extremity, unchanged from previous.   Lab Results:  Basename 01/14/12 0605 01/12/12 0613  WBC 8.7 9.7  HGB 11.3* 12.6  HCT 36.2 39.6  PLT 187 214   BMET  Basename 01/14/12 0605 01/12/12 0613  NA 142 142  K 4.0 3.9  CL 105 102  CO2 29 28  GLUCOSE 105* 106*  BUN 21 21  CREATININE 0.75 0.70  CALCIUM 9.7 9.9    Studies/Results: No results found.  Medications:     . acyclovir  400 mg Oral BID  . allopurinol  300 mg Oral Daily  . azelastine  2 spray Each Nare Daily  . brimonidine  1 drop Left Eye BID  . fluticasone  2 puff Inhalation BID  . furosemide  20 mg Oral Daily  . heparin  5,000 Units Subcutaneous Q8H  . hydrochlorothiazide  12.5 mg Oral Daily  . insulin aspart  0-9 Units Subcutaneous TID WC  . insulin glargine  20 Units Subcutaneous QHS  . latanoprost  1 drop Both Eyes QHS  . losartan  50 mg Oral Daily  . metoprolol succinate  50 mg Oral BID  . montelukast  10 mg Oral QHS  . pantoprazole  40 mg Oral Daily  . polyethylene glycol  17 g Oral Daily  .  potassium chloride  20 mEq Oral Daily  . pregabalin  100 mg Oral QHS  . sertraline  150 mg Oral QHS  . simvastatin  40 mg Oral q morning - 10a  . sodium chloride  3 mL Intravenous Q12H  . sodium chloride  3 mL Intravenous Q12H  . Vitamin D (Ergocalciferol)  50,000 Units Oral Q7 days  . DISCONTD: aspirin  325 mg Oral Daily    Assessment/Plan: 61 yo female with fall 1 month ago who has been seen at clinic for post concussive syndrome and had witnessed syncopal event.  # Left sided cord compression at C3-C4: neurosurgery recommends ACDF with plating of C3-C4.  ENT evaluated for remote hx Vocal cord paralysis and was cleared for neurosurgery.   - surgery tomorrow - preop labs overall normal: hemoglobin of 11.3 from 12.6 but appears dilutional since decrease is affecting all cell lines. Moreover, she ranges between 10.6 and 11.5  - will continue her BP medications  - will hold aspirin prior to surgery: received dose this am.   # Syncope: unclear etiology. Brain MRI did not show any acute infarct. It showed chronic microvascular ischemia and large central and left sided disc protrusion C3-C4 with possible cord compression which would not be cause for syncopal event. Cardiac enzymes have been negative x2. There is no evidence  of electrolyte imbalance. Echo shows EF: 55-60% with mild mitral regurgitation and aortic sclerosis which should not cause syncope. TSH was normal at 1.325. Patient not orthostatic.  - no new episodes since admission.   # DM- On metformin at home which has been held in case of imaging. CBG's 99-140 - continue sliding scale Sliding scale prior to surgery  CBG (last 3)   Basename 01/14/12 0758 01/13/12 2230 01/13/12 1152  GLUCAP 110* 141* 100*    # HTN- stable. Continue home regimen: lasix 20 qd, hctz 12.5 qd, losartan 50 and metoprolol 50 bid # HLD- Continue simvastatin 40  #Depression- Continue Zoloft. 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 No evidence of  lesions, per patient.  # OSA-  Has not needed CPAP in house. # GERD- Continue PPI  # PPX-  Heparin SQ for DVT Prophylaxis. # Dispo- Pending surgery   LOS: 6 days   [name] [name] 01/14/2012, 11:57 AM

## 2012-01-14 NOTE — Progress Notes (Signed)
Physical Therapy Treatment Patient Details Name: [name] [name] MRN: [id number] DOB: [birth year] Today's Date: 01/14/2012 Time: 6295-2841 PT Time Calculation (min): 15 min  PT Assessment / Plan / Recommendation Comments on Treatment Session  Patient progressing with mobility. Patient is schedule for surgery tomorrow. Will hold PT tomorrow and plan on reevaling patient after surgery.    Follow Up Recommendations  Home health PT    Barriers to Discharge        Equipment Recommendations  None recommended by PT    Recommendations for Other Services    Frequency Min 3X/week   Plan Discharge plan remains appropriate    Precautions / Restrictions Precautions Precautions: Fall Restrictions Weight Bearing Restrictions: No   Pertinent Vitals/Pain Denied pain    Mobility  Bed Mobility Bed Mobility: Not assessed Transfers Sit to Stand: 4: Min guard;With armrests;With upper extremity assist;From chair/3-in-1 Stand to Sit: 4: Min guard;To chair/3-in-1;With armrests;With upper extremity assist Details for Transfer Assistance: Cues for efficient technique, practiced x5 for strengthening and technique Ambulation/Gait Ambulation/Gait Assistance: 4: Min guard Ambulation Distance (Feet): 300 Feet Assistive device: Rolling walker Ambulation/Gait Assistance Details: Patient ambulating well. No LOB.  Gait Pattern: Step-through pattern;Decreased stride length Gait velocity: 0.9 ft/sec Stairs: No    Exercises     PT Diagnosis:    PT Problem List:   PT Treatment Interventions:     PT Goals Acute Rehab PT Goals PT Goal: Sit to Stand - Progress: Progressing toward goal PT Goal: Stand to Sit - Progress: Progressing toward goal PT Goal: Ambulate - Progress: Progressing toward goal  Visit Information  Last PT Received On: 01/14/12 Assistance Needed: +1    Subjective Data  Subjective: It feels good to be up and walking   Cognition  Overall Cognitive Status: Appears within functional  limits for tasks assessed/performed Arousal/Alertness: Awake/alert Orientation Level: Appears intact for tasks assessed Behavior During Session: [facility] for tasks performed    Balance     End of Session PT - End of Session Equipment Utilized During Treatment: Gait belt Activity Tolerance: Patient tolerated treatment well Patient left: in chair;with call bell/phone within reach Nurse Communication: Mobility status    Fredrich Birks 01/14/2012, 9:59 AM 01/14/2012 Fredrich Birks PTA [phone] pager [phone] office

## 2012-01-14 NOTE — Clinical Social Work Psychosocial (Signed)
     Clinical Social Work Department BRIEF PSYCHOSOCIAL ASSESSMENT 01/14/2012  Patient:  [name] [name], [name] [name]     Account Number:  [id number]     Admit date:  01/08/2012  Clinical Social Worker:  Lourdes Sledge  Date/Time:  01/14/2012 02:12 PM  Referred by:  Physician  Date Referred:  01/13/2012 Referred for  SNF Placement   Other Referral:   Interview type:  Patient Other interview type:   Son, [name]    PSYCHOSOCIAL DATA Living Status:  WITH ADULT CHILDREN Admitted from facility:   Level of care:   Primary support name:  [name] [name] Primary support relationship to patient:  CHILD, ADULT Degree of support available:   Pt lives with her son who is supportive towards pt.    CURRENT CONCERNS Current Concerns  Post-Acute Placement   Other Concerns:    SOCIAL WORK ASSESSMENT / PLAN CSW received referral as pt may need placement at dc. CSW met with pt and pt son to discuss possible disoposition options after pt surgery. CSW informed pt that though PT has not evaluated pt that there is a possibility they will recommend placement for her after her surgery tomorrow. Pt stated she would prefer to return home and does not want to go to a nursing facility. Pt stated she would consider going if PT recommends it however she does not want CSW to fax her out to facilities just yet.   Assessment/plan status:  Psychosocial Support/Ongoing Assessment of Needs Other assessment/ plan:   Information/referral to community resources:   CSW provided pt CSW contact information.    PATIENTS/FAMILYS RESPONSE TO PLAN OF CARE: Pt sitting up on a chair, alert and oriented. Pt states she understands she may need placement after surgery however she is hopeful to be able to return home and would like to wait after surgery befor being faxed out. CSW informed pt that she will return after surgery, once PT evaluates pt to determine the care she may need at dc. Pt appreciative of CSW visit.

## 2012-01-14 NOTE — Progress Notes (Signed)
Patient ID: [name] [name], female   DOB: 17-May-1951, 61 y.o.   MRN: [id number] Pt ready for surgery tomorrow. Discussed surgery with her and her son, and reviewed the images with them. No change neuro exam. Plan ACDF C3-4, C5-6, C6-7. Understand risks/ benefits.

## 2012-01-15 ENCOUNTER — Encounter (HOSPITAL_COMMUNITY)
Admission: AD | Disposition: A | Discharge status: Skilled Nursing Facility | Payer: Self-pay | Source: Ambulatory Visit | Attending: Family Medicine

## 2012-01-15 ENCOUNTER — Inpatient Hospital Stay: Payer: Medicare Other | Admitting: Family Medicine

## 2012-01-15 ENCOUNTER — Encounter (HOSPITAL_COMMUNITY): Payer: Self-pay | Admitting: Certified Registered"

## 2012-01-15 ENCOUNTER — Inpatient Hospital Stay (HOSPITAL_COMMUNITY): Payer: Medicare Other | Admitting: Certified Registered"

## 2012-01-15 ENCOUNTER — Inpatient Hospital Stay (HOSPITAL_COMMUNITY): Payer: Medicare Other

## 2012-01-15 HISTORY — PX: ANTERIOR CERVICAL DECOMP/DISCECTOMY FUSION: SHX1161

## 2012-01-15 LAB — GLUCOSE, CAPILLARY
Glucose-Capillary: 104 mg/dL — ABNORMAL HIGH (ref 70–99)
Glucose-Capillary: 124 mg/dL — ABNORMAL HIGH (ref 70–99)
Glucose-Capillary: 194 mg/dL — ABNORMAL HIGH (ref 70–99)
Glucose-Capillary: 170 mg/dL — ABNORMAL HIGH (ref 70–99)
Glucose-Capillary: 146 mg/dL — ABNORMAL HIGH (ref 70–99)

## 2012-01-15 LAB — MRSA PCR SCREENING: MRSA by PCR: NEGATIVE

## 2012-01-15 LAB — SURGICAL PCR SCREEN
MRSA, PCR: NEGATIVE
Staphylococcus aureus: NEGATIVE

## 2012-01-15 SURGERY — ANTERIOR CERVICAL DECOMPRESSION/DISCECTOMY FUSION 3 LEVELS
Anesthesia: General | Site: Neck | Wound class: Clean

## 2012-01-15 MED ORDER — FUROSEMIDE 20 MG PO TABS: 20.0000 mg | ORAL_TABLET | Freq: Every day | ORAL | Status: DC

## 2012-01-15 MED ORDER — ONDANSETRON HCL 4 MG/2ML IJ SOLN
INTRAMUSCULAR | Status: DC | PRN
  Administered 2012-01-15: 4 mg via INTRAVENOUS

## 2012-01-15 MED ORDER — ACYCLOVIR 400 MG PO TABS: 400.0000 mg | ORAL_TABLET | Freq: Two times a day (BID) | ORAL | Status: DC

## 2012-01-15 MED ORDER — PROPOFOL 10 MG/ML IV EMUL
INTRAVENOUS | Status: DC | PRN
  Administered 2012-01-15: 140 mg via INTRAVENOUS

## 2012-01-15 MED ORDER — SUFENTANIL CITRATE 50 MCG/ML IV SOLN
INTRAVENOUS | Status: DC | PRN
  Administered 2012-01-15: 20 ug via INTRAVENOUS
  Administered 2012-01-15: 10 ug via INTRAVENOUS
  Administered 2012-01-15: 20 ug via INTRAVENOUS

## 2012-01-15 MED ORDER — CEFAZOLIN SODIUM 1-5 GM-% IV SOLN
1.0000 g | Freq: Three times a day (TID) | INTRAVENOUS | Status: AC
  Administered 2012-01-15 – 2012-01-16 (×2): 1 g via INTRAVENOUS
  Filled 2012-01-15 (×2): qty 50

## 2012-01-15 MED ORDER — SODIUM CHLORIDE 0.9 % IJ SOLN
3.0000 mL | Freq: Two times a day (BID) | INTRAMUSCULAR | Status: DC
  Administered 2012-01-15 – 2012-01-18 (×2): 3 mL via INTRAVENOUS

## 2012-01-15 MED ORDER — HYDROMORPHONE HCL PF 1 MG/ML IJ SOLN
0.2500 mg | INTRAMUSCULAR | Status: DC | PRN
  Administered 2012-01-16: 0.5 mg via INTRAVENOUS
  Filled 2012-01-15: qty 1

## 2012-01-15 MED ORDER — MIDAZOLAM HCL 2 MG/2ML IJ SOLN: 1.0000 mg | INTRAMUSCULAR | Status: DC | PRN

## 2012-01-15 MED ORDER — SERTRALINE HCL 50 MG PO TABS: 150.0000 mg | ORAL_TABLET | Freq: Every day | ORAL | Status: DC

## 2012-01-15 MED ORDER — NEOSTIGMINE METHYLSULFATE 1 MG/ML IJ SOLN
INTRAMUSCULAR | Status: DC | PRN
  Administered 2012-01-15: 3 mg via INTRAVENOUS

## 2012-01-15 MED ORDER — BUPIVACAINE HCL (PF) 0.25 % IJ SOLN
INTRAMUSCULAR | Status: DC | PRN
  Administered 2012-01-15: 7 mL

## 2012-01-15 MED ORDER — METOPROLOL SUCCINATE ER 50 MG PO TB24: 50.0000 mg | ORAL_TABLET | Freq: Two times a day (BID) | ORAL | Status: DC

## 2012-01-15 MED ORDER — ALLOPURINOL 300 MG PO TABS: 300.0000 mg | ORAL_TABLET | Freq: Every day | ORAL | Status: DC

## 2012-01-15 MED ORDER — ONDANSETRON HCL 4 MG/2ML IJ SOLN: 4.0000 mg | INTRAMUSCULAR | Status: DC | PRN

## 2012-01-15 MED ORDER — SIMVASTATIN 40 MG PO TABS: 40.0000 mg | ORAL_TABLET | Freq: Every day | ORAL | Status: DC

## 2012-01-15 MED ORDER — METFORMIN HCL 500 MG PO TABS
1000.0000 mg | ORAL_TABLET | Freq: Two times a day (BID) | ORAL | Status: DC
  Administered 2012-01-15 – 2012-01-21 (×12): 1000 mg via ORAL
  Filled 2012-01-15 (×14): qty 2

## 2012-01-15 MED ORDER — LIDOCAINE HCL (CARDIAC) 20 MG/ML IV SOLN
INTRAVENOUS | Status: DC | PRN
  Administered 2012-01-15: 100 mg via INTRAVENOUS

## 2012-01-15 MED ORDER — FENTANYL CITRATE 0.05 MG/ML IJ SOLN: 50.0000 ug | INTRAMUSCULAR | Status: DC | PRN

## 2012-01-15 MED ORDER — MORPHINE SULFATE 2 MG/ML IJ SOLN
1.0000 mg | INTRAMUSCULAR | Status: DC | PRN
  Administered 2012-01-16 – 2012-01-17 (×3): 2 mg via INTRAVENOUS
  Filled 2012-01-15 (×4): qty 1

## 2012-01-15 MED ORDER — SODIUM CHLORIDE 0.9 % IV SOLN
INTRAVENOUS | Status: AC
  Filled 2012-01-15: qty 500

## 2012-01-15 MED ORDER — DROPERIDOL 2.5 MG/ML IJ SOLN
INTRAMUSCULAR | Status: DC | PRN
  Administered 2012-01-15: 0.625 mg via INTRAVENOUS

## 2012-01-15 MED ORDER — LORAZEPAM 2 MG/ML IJ SOLN: 1.0000 mg | Freq: Once | INTRAMUSCULAR | Status: AC | PRN

## 2012-01-15 MED ORDER — POLYETHYLENE GLYCOL 3350 17 G PO PACK: 17.0000 g | PACK | Freq: Every day | ORAL | Status: DC | PRN

## 2012-01-15 MED ORDER — SODIUM CHLORIDE 0.9 % IV SOLN: 250.0000 mL | INTRAVENOUS | Status: DC

## 2012-01-15 MED ORDER — THROMBIN 20000 UNITS EX KIT
PACK | CUTANEOUS | Status: DC | PRN
  Administered 2012-01-15: 20000 [IU] via TOPICAL

## 2012-01-15 MED ORDER — 0.9 % SODIUM CHLORIDE (POUR BTL) OPTIME
TOPICAL | Status: DC | PRN
  Administered 2012-01-15: 1000 mL

## 2012-01-15 MED ORDER — DEXAMETHASONE SODIUM PHOSPHATE 4 MG/ML IJ SOLN
INTRAMUSCULAR | Status: DC | PRN
  Administered 2012-01-15: 4 mg via INTRAVENOUS

## 2012-01-15 MED ORDER — CEFAZOLIN SODIUM-DEXTROSE 2-3 GM-% IV SOLR
INTRAVENOUS | Status: AC
  Administered 2012-01-15: 2 g via INTRAVENOUS
  Filled 2012-01-15: qty 50

## 2012-01-15 MED ORDER — PANTOPRAZOLE SODIUM 40 MG PO TBEC: 40.0000 mg | DELAYED_RELEASE_TABLET | Freq: Every day | ORAL | Status: DC

## 2012-01-15 MED ORDER — BACITRACIN 50000 UNITS IM SOLR
INTRAMUSCULAR | Status: AC
  Filled 2012-01-15: qty 1

## 2012-01-15 MED ORDER — SODIUM CHLORIDE 0.9 % IV SOLN
INTRAVENOUS | Status: DC | PRN
  Administered 2012-01-15: 12:00:00 via INTRAVENOUS

## 2012-01-15 MED ORDER — MONTELUKAST SODIUM 10 MG PO TABS: 10.0000 mg | ORAL_TABLET | Freq: Every day | ORAL | Status: DC

## 2012-01-15 MED ORDER — SODIUM CHLORIDE 0.9 % IJ SOLN: 3.0000 mL | INTRAMUSCULAR | Status: DC | PRN

## 2012-01-15 MED ORDER — GLYCOPYRROLATE 0.2 MG/ML IJ SOLN
INTRAMUSCULAR | Status: DC | PRN
  Administered 2012-01-15: 0.2 mg via INTRAVENOUS
  Administered 2012-01-15: 0.4 mg via INTRAVENOUS

## 2012-01-15 MED ORDER — SODIUM CHLORIDE 0.9 % IR SOLN
Status: DC | PRN
  Administered 2012-01-15: 10:00:00

## 2012-01-15 MED ORDER — ACETAMINOPHEN 650 MG RE SUPP: 650.0000 mg | RECTAL | Status: DC | PRN

## 2012-01-15 MED ORDER — LACTATED RINGERS IV SOLN
INTRAVENOUS | Status: DC | PRN
  Administered 2012-01-15 (×2): via INTRAVENOUS

## 2012-01-15 MED ORDER — TRAMADOL HCL 50 MG PO TABS
50.0000 mg | ORAL_TABLET | Freq: Three times a day (TID) | ORAL | Status: DC | PRN
  Administered 2012-01-15 – 2012-01-18 (×2): 50 mg via ORAL
  Administered 2012-01-18 – 2012-01-21 (×5): 100 mg via ORAL
  Filled 2012-01-15 (×3): qty 2
  Filled 2012-01-15: qty 1
  Filled 2012-01-15 (×3): qty 2

## 2012-01-15 MED ORDER — GABAPENTIN 300 MG PO CAPS
300.0000 mg | ORAL_CAPSULE | Freq: Three times a day (TID) | ORAL | Status: DC
  Administered 2012-01-15 – 2012-01-21 (×18): 300 mg via ORAL
  Filled 2012-01-15 (×20): qty 1

## 2012-01-15 MED ORDER — HYDROCODONE-ACETAMINOPHEN 5-325 MG PO TABS
1.0000 | ORAL_TABLET | ORAL | Status: DC | PRN
  Administered 2012-01-16 – 2012-01-21 (×10): 2 via ORAL
  Filled 2012-01-15 (×11): qty 2

## 2012-01-15 MED ORDER — PREGABALIN 75 MG PO CAPS
200.0000 mg | ORAL_CAPSULE | Freq: Every day | ORAL | Status: DC
  Administered 2012-01-15 – 2012-01-20 (×6): 200 mg via ORAL
  Filled 2012-01-15: qty 1
  Filled 2012-01-15: qty 4
  Filled 2012-01-15 (×5): qty 1

## 2012-01-15 MED ORDER — NITROGLYCERIN 0.4 MG SL SUBL: 0.4000 mg | SUBLINGUAL_TABLET | SUBLINGUAL | Status: DC | PRN

## 2012-01-15 MED ORDER — LATANOPROST 0.005 % OP SOLN: 1.0000 [drp] | Freq: Every day | OPHTHALMIC | Status: DC

## 2012-01-15 MED ORDER — MIDAZOLAM HCL 5 MG/5ML IJ SOLN
INTRAMUSCULAR | Status: DC | PRN
  Administered 2012-01-15: 2 mg via INTRAVENOUS

## 2012-01-15 MED ORDER — ROCURONIUM BROMIDE 100 MG/10ML IV SOLN
INTRAVENOUS | Status: DC | PRN
  Administered 2012-01-15: 50 mg via INTRAVENOUS

## 2012-01-15 MED ORDER — POTASSIUM CHLORIDE IN NACL 20-0.9 MEQ/L-% IV SOLN
INTRAVENOUS | Status: DC
  Administered 2012-01-15: 15:00:00 via INTRAVENOUS
  Filled 2012-01-15 (×13): qty 1000

## 2012-01-15 MED ORDER — PHENOL 1.4 % MT LIQD
1.0000 | OROMUCOSAL | Status: DC | PRN
  Filled 2012-01-15: qty 177

## 2012-01-15 MED ORDER — LOSARTAN POTASSIUM-HCTZ 100-25 MG PO TABS: 1.0000 | ORAL_TABLET | Freq: Every day | ORAL | Status: DC

## 2012-01-15 MED ORDER — ACETAMINOPHEN 325 MG PO TABS: 650.0000 mg | ORAL_TABLET | ORAL | Status: DC | PRN

## 2012-01-15 MED ORDER — PHENYLEPHRINE HCL 10 MG/ML IJ SOLN
INTRAMUSCULAR | Status: DC | PRN
  Administered 2012-01-15: 80 ug via INTRAVENOUS

## 2012-01-15 MED ORDER — LIDOCAINE HCL 4 % MT SOLN
OROMUCOSAL | Status: DC | PRN
  Administered 2012-01-15: 4 mL via TOPICAL

## 2012-01-15 MED ORDER — DOCUSATE SODIUM 100 MG PO CAPS: 100.0000 mg | ORAL_CAPSULE | Freq: Two times a day (BID) | ORAL | Status: DC | PRN

## 2012-01-15 MED ORDER — PREGABALIN 50 MG PO CAPS
100.0000 mg | ORAL_CAPSULE | Freq: Every day | ORAL | Status: DC
  Administered 2012-01-15 – 2012-01-21 (×7): 100 mg via ORAL
  Filled 2012-01-15: qty 2
  Filled 2012-01-15: qty 1
  Filled 2012-01-15 (×4): qty 2
  Filled 2012-01-15: qty 1
  Filled 2012-01-15: qty 2

## 2012-01-15 MED ORDER — HEMOSTATIC AGENTS (NO CHARGE) OPTIME
TOPICAL | Status: DC | PRN
  Administered 2012-01-15: 1 via TOPICAL

## 2012-01-15 MED ORDER — THROMBIN 5000 UNITS EX SOLR
OROMUCOSAL | Status: DC | PRN
  Administered 2012-01-15: 10:00:00 via TOPICAL

## 2012-01-15 MED ORDER — MENTHOL 3 MG MT LOZG: 1.0000 | LOZENGE | OROMUCOSAL | Status: DC | PRN

## 2012-01-15 MED ORDER — PREGABALIN 50 MG PO CAPS: 100.0000 mg | ORAL_CAPSULE | Freq: Two times a day (BID) | ORAL | Status: DC

## 2012-01-15 SURGICAL SUPPLY — 60 items
APL SKNCLS STERI-STRIP NONHPOA (GAUZE/BANDAGES/DRESSINGS) ×1
BAG DECANTER FOR FLEXI CONT (MISCELLANEOUS) ×2 IMPLANT
BENZOIN TINCTURE PRP APPL 2/3 (GAUZE/BANDAGES/DRESSINGS) ×2 IMPLANT
BONE CERV LORDOTIC 14.5X12X6 (Bone Implant) ×2 IMPLANT
BONE CERV LORDOTIC 14.5X12X7 (Bone Implant) ×4 IMPLANT
BUR MATCHSTICK NEURO 3.0 LAGG (BURR) ×2 IMPLANT
CANISTER SUCTION 2500CC (MISCELLANEOUS) ×2 IMPLANT
CLOTH BEACON ORANGE TIMEOUT ST (SAFETY) ×2 IMPLANT
CONT SPEC 4OZ CLIKSEAL STRL BL (MISCELLANEOUS) ×2 IMPLANT
DRAIN SNY WOU 7FLT (WOUND CARE) ×1 IMPLANT
DRAPE C-ARM 42X72 X-RAY (DRAPES) ×4 IMPLANT
DRAPE LAPAROTOMY 100X72 PEDS (DRAPES) ×2 IMPLANT
DRAPE MICROSCOPE ZEISS OPMI (DRAPES) ×2 IMPLANT
DRAPE POUCH INSTRU U-SHP 10X18 (DRAPES) ×2 IMPLANT
DRESSING TELFA 8X3 (GAUZE/BANDAGES/DRESSINGS) ×2 IMPLANT
DRSG OPSITE 4X5.5 SM (GAUZE/BANDAGES/DRESSINGS) ×2 IMPLANT
DRSG TEGADERM 4X4.75 (GAUZE/BANDAGES/DRESSINGS) ×1 IMPLANT
DURAPREP 6ML APPLICATOR 50/CS (WOUND CARE) ×2 IMPLANT
ELECT COATED BLADE 2.86 ST (ELECTRODE) ×2 IMPLANT
ELECT REM PT RETURN 9FT ADLT (ELECTROSURGICAL) ×2
ELECTRODE REM PT RTRN 9FT ADLT (ELECTROSURGICAL) ×1 IMPLANT
EVACUATOR SILICONE 100CC (DRAIN) ×1 IMPLANT
GAUZE SPONGE 4X4 16PLY XRAY LF (GAUZE/BANDAGES/DRESSINGS) ×1 IMPLANT
GLOVE BIO SURGEON STRL SZ8 (GLOVE) ×2 IMPLANT
GLOVE BIOGEL PI IND STRL 8 (GLOVE) IMPLANT
GLOVE BIOGEL PI INDICATOR 8 (GLOVE) ×1
GLOVE ECLIPSE 6.5 STRL STRAW (GLOVE) ×1 IMPLANT
GLOVE ECLIPSE 7.5 STRL STRAW (GLOVE) ×2 IMPLANT
GOWN BRE IMP SLV AUR LG STRL (GOWN DISPOSABLE) ×5 IMPLANT
GOWN BRE IMP SLV AUR XL STRL (GOWN DISPOSABLE) ×4 IMPLANT
GOWN STRL REIN 2XL LVL4 (GOWN DISPOSABLE) IMPLANT
GRAFT BNE SPCR VG2 14.5X12X6 (Bone Implant) IMPLANT
GRAFT BNE SPCR VG2 14.5X12X7 (Bone Implant) IMPLANT
HEAD HALTER (SOFTGOODS) IMPLANT
HEMOSTAT POWDER KIT SURGIFOAM (HEMOSTASIS) ×1 IMPLANT
KIT BASIN OR (CUSTOM PROCEDURE TRAY) ×2 IMPLANT
KIT ROOM TURNOVER OR (KITS) ×2 IMPLANT
NDL HYPO 25X1 1.5 SAFETY (NEEDLE) ×1 IMPLANT
NDL SPNL 20GX3.5 QUINCKE YW (NEEDLE) ×1 IMPLANT
NEEDLE HYPO 25X1 1.5 SAFETY (NEEDLE) ×2 IMPLANT
NEEDLE SPNL 20GX3.5 QUINCKE YW (NEEDLE) ×2 IMPLANT
NS IRRIG 1000ML POUR BTL (IV SOLUTION) ×2 IMPLANT
PACK LAMINECTOMY NEURO (CUSTOM PROCEDURE TRAY) ×2 IMPLANT
PAD ARMBOARD 7.5X6 YLW CONV (MISCELLANEOUS) ×4 IMPLANT
RUBBERBAND STERILE (MISCELLANEOUS) ×4 IMPLANT
SCREW SELF TAP 4.0X14MM (Screw) ×10 IMPLANT
SPACER ACF PARALLEL 7MM (Bone Implant) ×1 IMPLANT
SPONGE GAUZE 4X4 12PLY (GAUZE/BANDAGES/DRESSINGS) ×1 IMPLANT
SPONGE INTESTINAL PEANUT (DISPOSABLE) ×2 IMPLANT
SPONGE SURGIFOAM ABS GEL 100 (HEMOSTASIS) ×2 IMPLANT
STRIP CLOSURE SKIN 1/2X4 (GAUZE/BANDAGES/DRESSINGS) ×2 IMPLANT
SUT VIC AB 3-0 SH 8-18 (SUTURE) ×3 IMPLANT
SYR 20ML ECCENTRIC (SYRINGE) ×1 IMPLANT
SYRINGE 10CC LL (SYRINGE) ×1 IMPLANT
TOWEL OR 17X24 6PK STRL BLUE (TOWEL DISPOSABLE) ×2 IMPLANT
TOWEL OR 17X26 10 PK STRL BLUE (TOWEL DISPOSABLE) ×2 IMPLANT
TRAP SPECIMEN MUCOUS 40CC (MISCELLANEOUS) ×1 IMPLANT
VECTRA DRILL BIT 12 MM ×1 IMPLANT
Vectra-T plate 4 level 60 mm ×1 IMPLANT
WATER STERILE IRR 1000ML POUR (IV SOLUTION) ×2 IMPLANT

## 2012-01-15 NOTE — Progress Notes (Signed)
Orthopedic Tech Progress Note Patient Details:  [name] [name] Feb 16, 1951 [id number]  Other Ortho Devices Type of Ortho Device: [place] cervical collar;Other (comment) (soft cervical collar) Ortho Device Location: neck   Nikki Dom 01/15/2012, 5:26 PM

## 2012-01-15 NOTE — Progress Notes (Signed)
Daily Progress Note Si Raider. Clinton Sawyer, M.D., M.B.A  Family Medicine PGY-1 Pager [phone]  Subjective:  The patient had decompressive anterior discectomy, arthrodesis and plating of C3-C7 performed successfully today by Dr. Marikay Alar. Mrs. Greening is currently lying in her bed trying to sleep, but states that it is difficult to get comfortable. Her pain is well controlled, and she denies any weakness in her extremities.   Objective: Vital signs in last 24 hours: Temp:  [97 F (36.1 C)-99.1 F (37.3 C)] 98.1 F (36.7 C) (05/15 2200) Pulse Rate:  [60-94] 78  (05/15 2200) Resp:  [14-19] 18  (05/15 2200) BP: (98-153)/(65-76) 124/70 mmHg (05/15 2200) SpO2:  [93 %-100 %] 98 % (05/15 2200) Weight change:  Last BM Date: 01/13/12  Intake/Output from previous day: 05/14 0701 - 05/15 0700 In: -  Out: 1 [Urine:1] Intake/Output this shift:   Physical Exam: General appearance: alert, cooperative and no distress Neck: anterior neck drain in place Resp: clear to auscultation bilaterally Cardio: S1S2, rrr, 2/6 systolic murmur GI: soft, non-tender; bowel sounds normal; no masses,  no organomegaly Extremities: extremities normal, atraumatic, no cyanosis or edema Neurologic: CN3-12 grossly intact (stable chronic nystagmus), decreased strength in left hand and left lower extremity, unchanged from previous.   Lab Results:  [facility] 01/14/12 0605  WBC 8.7  HGB 11.3*  HCT 36.2  PLT 187   BMET  Basename 01/14/12 0605  NA 142  K 4.0  CL 105  CO2 29  GLUCOSE 105*  BUN 21  CREATININE 0.75  CALCIUM 9.7    Studies/Results: No results found.  Medications:     . acyclovir  400 mg Oral BID  . allopurinol  300 mg Oral Daily  . azelastine  2 spray Each Nare Daily  . bacitracin      . brimonidine  1 drop Left Eye BID  . ceFAZolin      .  ceFAZolin (ANCEF) IV  1 g Intravenous Q8H  . fluticasone  2 puff Inhalation BID  . furosemide  20 mg Oral Daily  . gabapentin  300 mg Oral TID   . hydrochlorothiazide  12.5 mg Oral Daily  . insulin aspart  0-9 Units Subcutaneous TID WC  . insulin glargine  20 Units Subcutaneous QHS  . latanoprost  1 drop Both Eyes QHS  . losartan  50 mg Oral Daily  . metFORMIN  1,000 mg Oral BID AC  . metoprolol succinate  50 mg Oral BID  . montelukast  10 mg Oral QHS  . pantoprazole  40 mg Oral Daily  . polyethylene glycol  17 g Oral Daily  . potassium chloride  20 mEq Oral Daily  . pregabalin  100 mg Oral Daily  . pregabalin  200 mg Oral QHS  . sertraline  150 mg Oral QHS  . simvastatin  40 mg Oral q morning - 10a  . sodium chloride      . sodium chloride  3 mL Intravenous Q12H  . sodium chloride  3 mL Intravenous Q12H  . sodium chloride  3 mL Intravenous Q12H  . Vitamin D (Ergocalciferol)  50,000 Units Oral Q7 days  . DISCONTD: acyclovir  400 mg Oral BID  . DISCONTD: allopurinol  300 mg Oral Daily  . DISCONTD: furosemide  20 mg Oral Daily  . DISCONTD: heparin  5,000 Units Subcutaneous Q8H  . DISCONTD: latanoprost  1 drop Both Eyes QHS  . DISCONTD: losartan-hydrochlorothiazide  1 tablet Oral Daily  . DISCONTD: metoprolol succinate  50  mg Oral BID  . DISCONTD: montelukast  10 mg Oral Daily  . DISCONTD: pantoprazole  40 mg Oral Daily  . DISCONTD: pregabalin  100 mg Oral QHS  . DISCONTD: pregabalin  100-200 mg Oral BID  . DISCONTD: sertraline  150 mg Oral QHS  . DISCONTD: simvastatin  40 mg Oral Daily    Assessment/Plan: 61 yo female with fall 1 month ago who has been seen at clinic for post concussive syndrome and had witnessed syncopal event.  # Left sided cord compression at C3-C4: successful DACD, arthrodesis and plating of C3-C7 performed 5/16 - continue to follow neurosurgery recommendations for post-operative care   # Syncope: unclear etiology. Brain MRI did not show any acute infarct. It showed chronic microvascular ischemia and large central and left sided disc protrusion C3-C4 with possible cord compression which would not  be cause for syncopal event. Cardiac enzymes have been negative x2. There is no evidence of electrolyte imbalance. Echo shows EF: 55-60% with mild mitral regurgitation and aortic sclerosis which should not cause syncope. TSH was normal at 1.325. Patient not orthostatic.  - no new episodes since admission.   # DM- On metformin at home which has been held in case of imaging. CBG's 99-140 - continue sliding scale Sliding scale prior to surgery  CBG (last 3)   Basename 01/15/12 2222 01/15/12 2115 01/15/12 1632  GLUCAP 146* 170* 194*    # HTN- stable. Continue home regimen: lasix 20 qd, hctz 12.5 qd, losartan 50 and metoprolol 50 bid # HLD- Continue simvastatin 40  #Depression- Continue Zoloft. 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 No evidence of lesions, per patient.  # OSA-  Has not needed CPAP in house. # GERD- Continue PPI  # PPX-  Heparin SQ for DVT Prophylaxis. # Dispo- pending successful post-operative recovery    LOS: 7 days   Mat Carne 01/15/2012, 10:59 PM

## 2012-01-15 NOTE — Transfer of Care (Signed)
Immediate Anesthesia Transfer of Care Note  Patient: [name] [name]  Procedure(s) Performed: Procedure(s) (LRB): ANTERIOR CERVICAL DECOMPRESSION/DISCECTOMY FUSION 3 LEVELS (N/A)  Patient Location: PACU  Anesthesia Type: General  Level of Consciousness: oriented, sedated, patient cooperative and responds to stimulation  Airway & Oxygen Therapy: Patient Spontanous Breathing and Patient connected to nasal cannula oxygen  Post-op Assessment: Report given to PACU RN, Post -op Vital signs reviewed and stable and Patient moving all extremities  Post vital signs: Reviewed and stable  Complications: No apparent anesthesia complications

## 2012-01-15 NOTE — OR Nursing (Signed)
Dr. Yetta Barre talked with [name] [name] this morning and she was persistent about not coming back for another surgery. She felt that if Dr. Yetta Barre could do Cervical Four-Five in this surgery then she would not have to have another surgery at a later time. After pre-operatively discussing the surgical plan once again all parties decided it was best to move forward with the four level ACDF. [name] initialed the consent changes, and myself and Dorinda Hill, RN were witnessing the discussion and consent pre-operatively.

## 2012-01-15 NOTE — Progress Notes (Signed)
Patient ID: [name], female   DOB: Jan 07, 1951, 61 y.o.   MRN: [id number] Doing well post-op. MAEx4. States hands feel better. Grips equal. No obvious new neuro change. Mobilize.

## 2012-01-15 NOTE — Anesthesia Preprocedure Evaluation (Addendum)
Anesthesia Evaluation  Patient identified by MRN, date of birth, ID band Patient awake    Reviewed: Allergy & Precautions, H&P , NPO status , Patient's Chart, lab work & pertinent test results  Airway Mallampati: II TM Distance: >3 FB Neck ROM: Full    Dental   Pulmonary shortness of breath and with exertion, asthma , sleep apnea and Continuous Positive Airway Pressure Ventilation , COPD + rhonchi         Cardiovascular Exercise Tolerance: Poor hypertension, Pt. on medications and Pt. on home beta blockers + angina     Neuro/Psych  Headaches, Depression  Neuromuscular disease    GI/Hepatic Neg liver ROS, GERD-  Medicated and Controlled,  Endo/Other  Diabetes mellitus-, Well Controlled, Type 1, Insulin Dependent and Oral Hypoglycemic Agents  Renal/GU negative Renal ROS  negative genitourinary   Musculoskeletal negative musculoskeletal ROS (+)   Abdominal   Peds  Hematology negative hematology ROS (+)   Anesthesia Other Findings   Reproductive/Obstetrics negative OB ROS                          Anesthesia Physical Anesthesia Plan  ASA: III  Anesthesia Plan: General   Post-op Pain Management:    Induction: Intravenous  Airway Management Planned: Oral ETT  Additional Equipment:   Intra-op Plan:   Post-operative Plan: Extubation in OR  Informed Consent: I have reviewed the patients History and Physical, chart, labs and discussed the procedure including the risks, benefits and alternatives for the proposed anesthesia with the patient or authorized representative who has indicated his/her understanding and acceptance.   Dental advisory given  Plan Discussed with: CRNA and Surgeon  Anesthesia Plan Comments:        Anesthesia Quick Evaluation

## 2012-01-15 NOTE — Care Management Note (Signed)
    Page 1 of 2   01/20/2012     3:45:10 PM   CARE MANAGEMENT NOTE 01/20/2012  Patient:  BEXLEY, LAUBACH   Account Number:  [id number]  Date Initiated:  01/09/2012  Documentation initiated by:  ROYAL,CHERYL  Subjective/Objective Assessment:   Request for [facility] and shower chair.     Action/Plan:   Pt most likely will be responsible for at least part of cost of shower chair. HH needs unclear at this time, will follow for clarification and identification of needs.   Anticipated DC Date:  01/09/2012   Anticipated DC Plan:  HOME/SELF CARE         Choice offered to / List presented to:     DME arranged  Levan Hurst      DME agency  [facility]     [facility] arranged  HH-2 PT      [facility] agency  [facility]   Status of service:  In process, will continue to follow Medicare Important Message given?   (If response is "NO", the following Medicare IM given date fields will be blank) Date Medicare IM given:   Date Additional Medicare IM given:    Discharge Disposition:    Per UR Regulation:    If discussed at Long Length of Stay Meetings, dates discussed:   01/15/2012    Comments:  01/20/12 Onnie Boer, RN, BSN 1541 PT AGREEABLE NOW FOR SNF.  PT HAS CHOICEN [facility], AWAITING INSURANCE AUTH.  01/17/2012 @ 1345  Konrad Felix RN, MSN Patient attempted to participate in PT today, but too groggy perhaps from medications.  01/16/12 Kallie Edward, RN, BSN 1210 PT IS RECOVERING WELL AFTER SURG YESTERDAY.  WILL F/U ON DC NEEDS.  01/15/12 Onnie Boer, RN, BSN 1211 PT HAD AN ACDF TODAY.  WILL F/U ON DC NEEDS.  PT HAS SUGGESTED HH PT AND PT HAS BEEN ARRANGED WITH [facility].  RW ARRANGED WITH [name]  01/10/2012 Pt declined surgery, plan now for surgery Wed, 01/15/2012, CM will follow for d/c needs and progression. Johny Shock RN MPH

## 2012-01-15 NOTE — Op Note (Signed)
01/08/2012 - 01/15/2012  12:14 PM  PATIENT:  [name] [name]  61 y.o. female  PRE-OPERATIVE DIAGNOSIS:  Cervical spondylosis with cervical spinal stenosis at C3-4 C4-5  C5-6 and C6-7, with cervical spondylitic myelopathy  POST-OPERATIVE DIAGNOSIS:  Same  PROCEDURE:  1. Decompressive anterior cervical discectomy C3-4 C4-5 C5-6 and C6-7, 2. Anterior cervical arthrodesis C3-4 C4-5 C5-6 and C6-7 utilizing cortico-cancellus allografts, 3. Anterior cervical plating C3-C7 inclusive utilizing a Synthes translational plate  SURGEON:  Marikay Alar, MD  ASSISTANTS: Dr. Franky Macho  ANESTHESIA:   General  EBL: Less than 100 ml  Total I/O In: 2000 [I.V.:2000] Out: 550 [Urine:450; Blood:100]  BLOOD ADMINISTERED:none  DRAINS: 7 flat JP   SPECIMEN:  No Specimen  INDICATION FOR PROCEDURE: This patient presented with multiple falls, numbness and weakness in her hands, some pain in her arms, and difficulty with gait. MRI of the cervical spine showed a large disc protrusion at C3-4 causing severe spinal canal stenosis and signal change in the spinal cord, moderate spondylosis at C4-5, severe spinal stenosis with spondylosis at C5-6, and moderate stenosis with significant spondylosis at C6-7. I recommended an anterior cervical discectomy fusion plating at C3-4 C5-6 and C6-7, however the patient was adamant that she did not want to leave a junctional level therefore have the risk of needing further surgery in the future on the intervening level, and therefore chose to proceed with 4 level anterior cervical discectomy fusion. I felt this was reasonable And agreed to do this. Patient understood the risks, benefits, and alternatives and potential outcomes and wished to proceed.  PROCEDURE DETAILS: Patient was brought to the operating room placed under general endotracheal anesthesia. Patient was placed in the supine position on the operating room table. The neck was prepped with Duraprep and draped in a sterile  fashion.   Three cc of local anesthesia was injected and a transverse incision was made on the right side of the neck.  Dissection was carried down thru the subcutaneous tissue and the platysma was  elevated, opened, and undermined with Metzenbaum scissors.  Dissection was then carried out thru an avascular plane leaving the sternocleidomastoid carotid artery and jugular vein laterally and the trachea and esophagus medially. The ventral aspect of the vertebral column was identified and a localizing x-ray was taken. The C3-4 level was identified. The longus colli muscles were then elevated from C3-C7, M.D. C3-4 C4-5 C5-6 and C6-7 discs were all identified and marked. the shadow line retractor was placed. The annulus was incised at each level and the disc space entered. Discectomy was performed with micro-curettes and pituitary rongeurs. I then used the high-speed drill to drill the endplates down to the level of the posterior longitudinal ligament at each level.  The operating microscope was draped and brought into the field provided additional magnification, illumination and visualization. I started at C6-7 and worked my way superiorly. Discectomy was continued posteriorly thru the disc space. Posterior longitudinal ligament was opened with a nerve hook, and then removed along with disc herniation and osteophytes, decompressing the spinal canal and thecal sac at each level. The exact same decompression was performed at each level to undercut the vertebral bodies and decompressed the central canal. We then continued to remove osteophytic overgrowth and disc material decompressing the neural foramina and exiting nerve roots bilaterally. The scope was angled up and down to help decompress and undercut the vertebral bodies. At C3-4 she had a large central disc herniation causing severe compression of the dura. The disc herniated  both cephalad and caudad. It was removed with a nerve hook and pituitary rongeur and the  dura relaxed. I could see the cord pulsatile through the dura at each level. Once the decompression was completed we could pass a nerve hook circumferentially to assure adequate decompression in the midline and in the neural foramina. So by both visualization and palpation we felt we had an adequate decompression of the neural elements. We then measured the height of the intravertebral disc space at each level and selected a 7 millimeter cortical cancellus allograft.  They were  then gently positioned in the intravertebral disc space and countersunk. I then used a Synthes translational plate and placed fixed and variable angle screws into the vertebral bodies from C3-C7 and locked them into position. The wound was irrigated with bacitracin solution, checked for hemostasis which was established and confirmed. Once meticulous hemostasis was achieved with bipolar cautery and with Surgifoam, a 7 flat JP drain was placed and we then proceeded with closure. The platysma was closed with interrupted 3-0 undyed Vicryl suture, the subcuticular layer was closed with interrupted 3-0 undyed Vicryl suture. The skin edges were approximated with steristrips. The drapes were removed. A sterile dressing was applied. The patient was then awakened from general anesthesia and transferred to the recovery room in stable condition. At the end of the procedure all sponge, needle and instrument counts were correct.   PLAN OF CARE: Admit to inpatient   PATIENT DISPOSITION:  PACU - hemodynamically stable.   Delay start of Pharmacological VTE agent (>24hrs) due to surgical blood loss or risk of bleeding:  yes

## 2012-01-15 NOTE — Anesthesia Postprocedure Evaluation (Signed)
  Anesthesia Post-op Note  Patient: [name] [name]  Procedure(s) Performed: Procedure(s) (LRB): ANTERIOR CERVICAL DECOMPRESSION/DISCECTOMY FUSION 3 LEVELS (N/A)  Patient Location: PACU  Anesthesia Type: General  Level of Consciousness: awake  Airway and Oxygen Therapy: Patient Spontanous Breathing  Post-op Pain: mild  Post-op Assessment: Post-op Vital signs reviewed, Patient's Cardiovascular Status Stable, Respiratory Function Stable, Patent Airway, No signs of Nausea or vomiting and Pain level controlled  Post-op Vital Signs: stable  Complications: No apparent anesthesia complications

## 2012-01-16 ENCOUNTER — Inpatient Hospital Stay (HOSPITAL_COMMUNITY): Payer: Medicare Other

## 2012-01-16 ENCOUNTER — Encounter (HOSPITAL_COMMUNITY): Payer: Self-pay | Admitting: Neurological Surgery

## 2012-01-16 LAB — BASIC METABOLIC PANEL
BUN: 12 mg/dL (ref 6–23)
CO2: 28 mEq/L (ref 19–32)
Calcium: 9.4 mg/dL (ref 8.4–10.5)
Chloride: 103 mEq/L (ref 96–112)
Creatinine, Ser: 0.73 mg/dL (ref 0.50–1.10)
GFR calc Af Amer: >90 mL/min (ref >90)
GFR calc non Af Amer: >90 mL/min (ref >90)
Glucose, Bld: 178 mg/dL — ABNORMAL HIGH (ref 70–99)
Potassium: 4 mEq/L (ref 3.5–5.1)
Sodium: 140 mEq/L (ref 135–145)

## 2012-01-16 LAB — DIFFERENTIAL
Basophils Absolute: 0 10*3/uL (ref 0.0–0.1)
Basophils Relative: 0 % (ref 0–1)
Eosinophils Absolute: 0.1 10*3/uL (ref 0.0–0.7)
Eosinophils Relative: 1 % (ref 0–5)
Lymphocytes Relative: 27 % (ref 12–46)
Lymphs Abs: 3.4 10*3/uL (ref 0.7–4.0)
Monocytes Absolute: 0.9 10*3/uL (ref 0.1–1.0)
Monocytes Relative: 7 % (ref 3–12)
Neutro Abs: 8.3 10*3/uL — ABNORMAL HIGH (ref 1.7–7.7)
Neutrophils Relative %: 65 % (ref 43–77)

## 2012-01-16 LAB — GLUCOSE, CAPILLARY
Glucose-Capillary: 89 mg/dL (ref 70–99)
Glucose-Capillary: 119 mg/dL — ABNORMAL HIGH (ref 70–99)
Glucose-Capillary: 127 mg/dL — ABNORMAL HIGH (ref 70–99)

## 2012-01-16 LAB — CBC
HCT: 35.1 % — ABNORMAL LOW (ref 36.0–46.0)
Hemoglobin: 11.1 g/dL — ABNORMAL LOW (ref 12.0–15.0)
MCH: 28.1 pg (ref 26.0–34.0)
MCHC: 31.6 g/dL (ref 30.0–36.0)
MCV: 88.9 fL (ref 78.0–100.0)
Platelets: 200 10*3/uL (ref 150–400)
RBC: 3.95 MIL/uL (ref 3.87–5.11)
RDW: 15 % (ref 11.5–15.5)
WBC: 12.7 10*3/uL — ABNORMAL HIGH (ref 4.0–10.5)

## 2012-01-16 NOTE — Progress Notes (Signed)
FMTS Attending Note  Patient seen and examined by me, POD#1 s/p decomp anterior cervical diskectomy C3-7.  Is sitting up in chair eating lunch, reports considerable pain.  To continue glycemic control.  PT/OT and postop plan per neurosurgery. Patient anticipates going home at discharge. Paula Compton, MD

## 2012-01-16 NOTE — Progress Notes (Signed)
Subjective: Post op day 1. Pain in shoulders and neck as well as arm. Received dilaudid yesterday which helped. Still with weakness in left arm.   Objective: Vital signs in last 24 hours: Temp:  [97 F (36.1 C)-99.1 F (37.3 C)] 97.5 F (36.4 C) (05/16 0550) Pulse Rate:  [78-94] 79  (05/16 0550) Resp:  [14-19] 18  (05/16 0550) BP: (124-153)/(69-76) 143/73 mmHg (05/16 0550) SpO2:  [93 %-100 %] 95 % (05/16 0924) Weight change:  Last BM Date: 01/13/12  Intake/Output from previous day: 05/15 0701 - 05/16 0700 In: 3620 [P.O.:240; I.V.:3300; IV Piggyback:50] Out: 630 [Urine:450; Drains:80; Blood:100] Intake/Output this shift: Total I/O In: 380 [P.O.:360; I.V.:20] Out: -  Physical Exam: General appearance: alert, cooperative and no distress, cervical collar in place.  Resp: clear to auscultation bilaterally Cardio: S1S2, rrr, 2/6 systolic murmur GI: soft, non-tender; bowel sounds normal; no masses,  no organomegaly Extremities: extremities normal, atraumatic, no cyanosis or edema Neurologic: CN2-12 grossly intact, decreased strength in left hand and left lower extremity, increased brachioradialis reflex in left arm.   Lab Results:  [facility] 01/16/12 0904 01/14/12 0605  WBC 12.7* 8.7  HGB 11.1* 11.3*  HCT 35.1* 36.2  PLT 200 187   BMET  Basename 01/14/12 0605  NA 142  K 4.0  CL 105  CO2 29  GLUCOSE 105*  BUN 21  CREATININE 0.75  CALCIUM 9.7    Studies/Results: No results found.  Medications:     . acyclovir  400 mg Oral BID  . allopurinol  300 mg Oral Daily  . azelastine  2 spray Each Nare Daily  . bacitracin      . brimonidine  1 drop Left Eye BID  .  ceFAZolin (ANCEF) IV  1 g Intravenous Q8H  . fluticasone  2 puff Inhalation BID  . furosemide  20 mg Oral Daily  . gabapentin  300 mg Oral TID  . hydrochlorothiazide  12.5 mg Oral Daily  . insulin aspart  0-9 Units Subcutaneous TID WC  . insulin glargine  20 Units Subcutaneous QHS  . latanoprost  1 drop  Both Eyes QHS  . losartan  50 mg Oral Daily  . metFORMIN  1,000 mg Oral BID AC  . metoprolol succinate  50 mg Oral BID  . montelukast  10 mg Oral QHS  . pantoprazole  40 mg Oral Daily  . polyethylene glycol  17 g Oral Daily  . potassium chloride  20 mEq Oral Daily  . pregabalin  100 mg Oral Daily  . pregabalin  200 mg Oral QHS  . sertraline  150 mg Oral QHS  . simvastatin  40 mg Oral q morning - 10a  . sodium chloride      . sodium chloride  3 mL Intravenous Q12H  . sodium chloride  3 mL Intravenous Q12H  . sodium chloride  3 mL Intravenous Q12H  . Vitamin D (Ergocalciferol)  50,000 Units Oral Q7 days  . DISCONTD: acyclovir  400 mg Oral BID  . DISCONTD: allopurinol  300 mg Oral Daily  . DISCONTD: furosemide  20 mg Oral Daily  . DISCONTD: heparin  5,000 Units Subcutaneous Q8H  . DISCONTD: latanoprost  1 drop Both Eyes QHS  . DISCONTD: losartan-hydrochlorothiazide  1 tablet Oral Daily  . DISCONTD: metoprolol succinate  50 mg Oral BID  . DISCONTD: montelukast  10 mg Oral Daily  . DISCONTD: pantoprazole  40 mg Oral Daily  . DISCONTD: pregabalin  100 mg Oral QHS  . DISCONTD:  pregabalin  100-200 mg Oral BID  . DISCONTD: sertraline  150 mg Oral QHS  . DISCONTD: simvastatin  40 mg Oral Daily    Assessment/Plan: 61 yo female with fall 1 month ago who has been seen at clinic for post concussive syndrome and had witnessed syncopal event.  # Left sided cord compression at C3-C4:  Post op day 1 s/p: Decompressive anterior cervical discectomy C3-4 C4-5 C5-6 and C6-7, 2. Anterior cervical arthrodesis C3-4 C4-5 C5-6 and C6-7 utilizing cortico-cancellus allografts, 3. Anterior cervical plating C3-C7 inclusive utilizing a Synthes translational plate - doing well post-op - continue PT/OT - continue IV dilaudid for pain control - follow neuro surg recommendations  # Syncope: unclear etiology. Brain MRI did not show any acute infarct. It showed chronic microvascular ischemia and large central  and left sided disc protrusion C3-C4 with possible cord compression which would not be cause for syncopal event. Cardiac enzymes have been negative x2. There is no evidence of electrolyte imbalance. Echo shows EF: 55-60% with mild mitral regurgitation and aortic sclerosis which should not cause syncope. TSH was normal at 1.325. Patient not orthostatic.  - no new episodes since admission.   # DM- back on metformin since surgery. CBG's: 89-194 - monitor hypoglycemia  CBG (last 3)   Basename 01/16/12 0745 01/15/12 2222 01/15/12 2115  GLUCAP 89 146* 170*   # HTN- stable s/p surgery. Continue home regimen: lasix 20 qd, hctz 12.5 qd, losartan 50 and metoprolol 50 bid # HLD- Continue simvastatin 40  #Depression- Continue Zoloft. 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 No evidence of lesions, per patient.  # OSA-  Has not needed CPAP in house. # GERD- Continue PPI  # PPX-  Heparin SQ for DVT Prophylaxis. # Dispo- Pending recovery from surgery.    LOS: 8 days   Macario Shear 01/16/2012, 9:55 AM

## 2012-01-16 NOTE — Progress Notes (Signed)
Pt PIV out of date. Myself and Tresa Endo, RN assessed patient for routine rotation. No access found. IV team notified. Stated they would come back later to attempt an IV.

## 2012-01-16 NOTE — Progress Notes (Signed)
2230- pt attempted to void on BSC. Stated she felt "full" and that the urine was "down there" but she couldn't get it to go. Bladder scan revealed urine. In/out cath performed per order. urine returned. Will con't to monitor patient.

## 2012-01-16 NOTE — Evaluation (Signed)
Occupational Therapy Evaluation Patient Details Name: STEPHINE LANGBEHN MRN: [id number] DOB: 12-15-1950 Today's Date: 01/16/2012 Time: 9528-4132 OT Time Calculation (min): 29 min  OT Assessment / Plan / Recommendation Clinical Impression  Pt plans to return home when able. She is overall S-Min A LE bathe and dress crosses one leg over the other seated, maintaining back precautions & is wearing  soft cervical collar throughout (w/o A/E). Pt will benefit from acute OT to address deficits in ADL's and selfcare. ?HHOT depending on pt progress (pt reports that son lives with her and is able to assist).    OT Assessment  Patient needs continued OT Services    Follow Up Recommendations  Home health OT    Barriers to Discharge      Equipment Recommendations  3 in 1 bedside comode    Recommendations for Other Services    Frequency  Min 2X/week    Precautions / Restrictions Precautions Precautions: Fall;Other (comment) (Soft cervical collar) Restrictions Weight Bearing Restrictions: No   Pertinent Vitals/Pain 10/10 neck & arm/surgical pain, premedicated per pt and RN. Repositioned    ADL  Grooming: Performed;Wash/dry hands;Set up Where Assessed - Grooming: Other (comment) (standing at sink) Upper Body Bathing: Simulated;Chest;Right arm;Left arm;Abdomen;Set up Where Assessed - Upper Body Bathing: Supported sitting Lower Body Bathing: Simulated;Minimal assistance;Supervision/safety;Other (comment) (crosses one leg over the other maintaining back precaut, cer) Where Assessed - Lower Body Bathing: Supported sitting Upper Body Dressing: Performed;Set up;Min guard Where Assessed - Upper Body Dressing: Unsupported sit to stand Lower Body Dressing: Performed;Supervision/safety;Minimal assistance (Crosses one leg over the other maintaining back precautions) Where Assessed - Lower Body Dressing: Supported sitting Toilet Transfer: Performed;Supervision/safety Statistician Method: Other  (comment) (Ambulating w/ RW to 3:1 over toilet) Toilet Transfer Equipment: Raised toilet seat with arms (or 3-in-1 over toilet) Toileting - Clothing Manipulation and Hygiene: Performed;Supervision/safety;Modified independent Where Assessed - Engineer, mining and Hygiene: Standing Toileting - Clothing Manipulation: Performed;Supervision/safety;Modified independent Where Assessed - Glass blower/designer Manipulation: Standing Toileting - Hygiene: Performed;Set up;Supervision/safety Where Assessed - Toileting Hygiene: Sit on 3-in-1 or toilet Tub/Shower Transfer Method: Not assessed Equipment Used: Rolling walker;Gait belt;Other (comment) (3:1 over toilet; soft cervical collar) Transfers/Ambulation Related to ADLs: Pt performed functional mobility in room w/ supervision-Min guard assist using RW ADL Comments: Pt overall S-Min A LE bathe and dress crosses one leg over the other seated, maintaining back precautions wearing  soft cervical collar throughout (w/o A/E). Pt will benefit from acute OT to address deficits in ADL's and selfcare. ?HHOT depending on pt progress (pt reports son lives w/ her & is available to assist)    OT Diagnosis: Generalized weakness;Acute pain  OT Problem List: Decreased strength;Decreased activity tolerance;Decreased knowledge of use of DME or AE;Decreased coordination;Impaired balance (sitting and/or standing);Decreased knowledge of precautions OT Treatment Interventions: Self-care/ADL training;Therapeutic activities;DME and/or AE instruction;Patient/family education;Other (comment) (A/E training)   OT Goals Acute Rehab OT Goals OT Goal Formulation: With patient Potential to Achieve Goals: Good ADL Goals Pt Will Perform Grooming: with modified independence;Standing at sink ADL Goal: Grooming - Progress: Goal set today Pt Will Perform Upper Body Bathing: with modified independence;Standing at sink;Sitting at sink;with adaptive equipment ADL Goal: Upper Body  Bathing - Progress: Goal set today Pt Will Perform Lower Body Bathing: with modified independence;Sitting at sink;Standing at sink;with adaptive equipment ADL Goal: Lower Body Bathing - Progress: Goal set today Pt Will Perform Upper Body Dressing: with modified independence;with set-up;Sitting, chair;Sitting, bed ADL Goal: Upper Body Dressing - Progress: Goal set today Pt  Will Perform Lower Body Dressing: with modified independence;Sit to stand from chair;Sit to stand from bed;Other (comment) (w/ A/E PRN) ADL Goal: Lower Body Dressing - Progress: Goal set today Pt Will Transfer to Toilet: with modified independence;3-in-1 ADL Goal: Toilet Transfer - Progress: Goal set today Pt Will Perform Toileting - Clothing Manipulation: with modified independence;Standing ADL Goal: Toileting - Clothing Manipulation - Progress: Goal set today Pt Will Perform Toileting - Hygiene: with modified independence;Sit to stand from 3-in-1/toilet ADL Goal: Toileting - Hygiene - Progress: Goal set today  Visit Information  Last OT Received On: 01/16/12    Subjective Data  Subjective: I have pain in my left arm and neck Patient Stated Goal: return home   Prior Functioning  Home Living Lives With: Son Available Help at Discharge: Available 24 hours/day Type of Home: Apartment Home Access: Level entry Home Layout: One level Bathroom Shower/Tub: Tub/shower unit;Curtain Firefighter: Standard Home Adaptive Equipment: Clinical research associate - four wheeled Prior Function Level of Independence: Independent Driving: No Vocation: On disability Comments: uses cane in community per pt report Communication Communication: No difficulties Dominant Hand: Right    Cognition  Overall Cognitive Status: Appears within functional limits for tasks assessed/performed Arousal/Alertness: Awake/alert Orientation Level: Appears intact for tasks assessed Behavior During Session: [facility] for tasks performed    Extremity/Trunk  Assessment Right Upper Extremity Assessment RUE ROM/Strength/Tone: Within functional levels RUE Coordination: WFL - gross motor Left Upper Extremity Assessment LUE ROM/Strength/Tone: Within functional levels LUE Coordination: WFL - gross motor   Mobility Bed Mobility Bed Mobility: Not assessed Transfers Transfers: Sit to Stand Transfers: Yes Sit to Stand: 5: Supervision;Without upper extremity assist;From chair/3-in-1;From toilet Stand to Sit: 5: Supervision;4: Min guard;To chair/3-in-1           End of Session OT - End of Session Equipment Utilized During Treatment: Gait belt;Other (comment) (RW, comfort height toilet) Activity Tolerance: Patient tolerated treatment well Patient left: in chair;with call bell/phone within reach Nurse Communication: Other (comment);Mobility status (mobility status for transfers and functional mobility)   Charletta Cousin, Thana Farr 01/16/2012, 1:01 PM

## 2012-01-16 NOTE — Progress Notes (Signed)
PT Cancellation Note  Treatment cancelled today due to patient's refusal to participate.  Pt c/o neck and left UE pain and just received pain medication.  Pt refuses PT evaluation and agrees to see PT tomorrow.    [name] [name] 01/16/2012, 2:48 PM Jake Shark, PT DPT [phone]

## 2012-01-16 NOTE — Progress Notes (Signed)
Patient ID: [name] [name], female   DOB: [birth year], 61 y.o.   MRN: [id number] Subjective: Patient reports appropriate neck and shoulder soreness postoperatively. Has had some occasional left upper extremity pain consistent with radiculitis. No numbness tingling or weakness. Ambulating to the bathroom without difficulty, and has occasional difficulty with urination. Seems to be swallowing okay.  Objective: Vital signs in last 24 hours: Temp:  [97 F (36.1 C)-99.1 F (37.3 C)] 97.5 F (36.4 C) (05/16 0550) Pulse Rate:  [78-94] 79  (05/16 0550) Resp:  [14-19] 18  (05/16 0550) BP: (124-153)/(69-76) 143/73 mmHg (05/16 0550) SpO2:  [93 %-100 %] 96 % (05/16 0550)  Intake/Output from previous day: 05/15 0701 - 05/16 0700 In: 3545 [P.O.:240; I.V.:3225; IV Piggyback:50] Out: 630 [Urine:450; Drains:80; Blood:100] Intake/Output this shift:    Neurologic: Motor: Right grip 4+ out of 5, left grip 4/5, otherwise upper extremities seem to be 4-5 out of 5, good strength in lower extremities, stable from preop  Dressing dry, no obvious swelling, J-P in place  Lab Results: Lab Results  Component Value Date   WBC 8.7 01/14/2012   HGB 11.3* 01/14/2012   HCT 36.2 01/14/2012   MCV 89.6 01/14/2012   PLT 187 01/14/2012   Lab Results  Component Value Date   INR 0.94 01/14/2012   BMET Lab Results  Component Value Date   NA 142 01/14/2012   K 4.0 01/14/2012   CL 105 01/14/2012   CO2 29 01/14/2012   GLUCOSE 105* 01/14/2012   BUN 21 01/14/2012   CREATININE 0.75 01/14/2012   CALCIUM 9.7 01/14/2012    Studies/Results: Dg Cervical Spine 1 View  01/15/2012  *RADIOLOGY REPORT*  Clinical Data: Neck pain  DG C-ARM 1-60 MIN,DG CERVICAL SPINE - 1 VIEW  Comparison: MRI cervical spine 01/09/2012.  Findings: The patient has undergone C3-C7 ACDF with anterior plating.  Grossly satisfactory position and alignment.  IMPRESSION: As above.  Original Report Authenticated By: Elsie Stain, M.D.   Dg C-arm 1-60  Min  01/15/2012  *RADIOLOGY REPORT*  Clinical Data: Neck pain  DG C-ARM 1-60 MIN,DG CERVICAL SPINE - 1 VIEW  Comparison: MRI cervical spine 01/09/2012.  Findings: The patient has undergone C3-C7 ACDF with anterior plating.  Grossly satisfactory position and alignment.  IMPRESSION: As above.  Original Report Authenticated By: Elsie Stain, M.D.    Assessment/Plan: Seems to be doing well. Mobilize with PT and OT. KVO IVF.   LOS: 8 days    Ibraham Levi S 01/16/2012, 7:48 AM

## 2012-01-16 NOTE — Progress Notes (Signed)
Inpatient Diabetes Program Recommendations  AACE/ADA: New Consensus Statement on Inpatient Glycemic Control (2009)  Target Ranges:  Prepandial:   less than 140 mg/dL      Peak postprandial:   less than 180 mg/dL (1-2 hours)      Critically ill patients:  140 - 180 mg/dL    Patient's medication reconciliation says she takes Lantus 40 units at home.  Patient has been getting 1/2 home dose.  She has been well controlled on Lantus 20 units during this hospitalization.  Could hypoglycemia been related to her fall?  Consider discharge home on Lantus 20 instead of 40 units.    Thank you  Piedad Climes [facility] Inpatient Diabetes Coordinator [phone]

## 2012-01-16 NOTE — Progress Notes (Signed)
Repeat CT cervical spine resulted. On call neurosurgeon notified of results. No new orders received. Stated neurosurgeon would address with patient in the AM.

## 2012-01-17 LAB — DIFFERENTIAL
Basophils Absolute: 0 10*3/uL (ref 0.0–0.1)
Basophils Relative: 0 % (ref 0–1)
Eosinophils Absolute: 0.1 10*3/uL (ref 0.0–0.7)
Eosinophils Relative: 1 % (ref 0–5)
Lymphocytes Relative: 18 % (ref 12–46)
Lymphs Abs: 2.1 10*3/uL (ref 0.7–4.0)
Monocytes Absolute: 1 10*3/uL (ref 0.1–1.0)
Monocytes Relative: 9 % (ref 3–12)
Neutro Abs: 8.5 10*3/uL — ABNORMAL HIGH (ref 1.7–7.7)
Neutrophils Relative %: 73 % (ref 43–77)

## 2012-01-17 LAB — GLUCOSE, CAPILLARY
Glucose-Capillary: 100 mg/dL — ABNORMAL HIGH (ref 70–99)
Glucose-Capillary: 152 mg/dL — ABNORMAL HIGH (ref 70–99)
Glucose-Capillary: 150 mg/dL — ABNORMAL HIGH (ref 70–99)
Glucose-Capillary: 171 mg/dL — ABNORMAL HIGH (ref 70–99)
Glucose-Capillary: 183 mg/dL — ABNORMAL HIGH (ref 70–99)

## 2012-01-17 LAB — CBC
HCT: 32.6 % — ABNORMAL LOW (ref 36.0–46.0)
Hemoglobin: 10.2 g/dL — ABNORMAL LOW (ref 12.0–15.0)
MCH: 27.8 pg (ref 26.0–34.0)
MCHC: 31.3 g/dL (ref 30.0–36.0)
MCV: 88.8 fL (ref 78.0–100.0)
Platelets: 175 10*3/uL (ref 150–400)
RBC: 3.67 MIL/uL — ABNORMAL LOW (ref 3.87–5.11)
RDW: 15.2 % (ref 11.5–15.5)
WBC: 11.7 10*3/uL — ABNORMAL HIGH (ref 4.0–10.5)

## 2012-01-17 LAB — BASIC METABOLIC PANEL
BUN: 13 mg/dL (ref 6–23)
CO2: 29 mEq/L (ref 19–32)
Calcium: 9.6 mg/dL (ref 8.4–10.5)
Chloride: 97 mEq/L (ref 96–112)
Creatinine, Ser: 0.67 mg/dL (ref 0.50–1.10)
GFR calc Af Amer: >90 mL/min (ref >90)
GFR calc non Af Amer: >90 mL/min (ref >90)
Glucose, Bld: 152 mg/dL — ABNORMAL HIGH (ref 70–99)
Potassium: 4.1 mEq/L (ref 3.5–5.1)
Sodium: 135 mEq/L (ref 135–145)

## 2012-01-17 MED ORDER — DEXAMETHASONE SODIUM PHOSPHATE 4 MG/ML IJ SOLN
4.0000 mg | Freq: Four times a day (QID) | INTRAMUSCULAR | Status: AC
  Administered 2012-01-17 – 2012-01-18 (×4): 4 mg via INTRAVENOUS
  Filled 2012-01-17 (×3): qty 1

## 2012-01-17 MED ORDER — CELECOXIB 200 MG PO CAPS
200.0000 mg | ORAL_CAPSULE | Freq: Two times a day (BID) | ORAL | Status: DC
  Administered 2012-01-17 – 2012-01-21 (×9): 200 mg via ORAL
  Filled 2012-01-17 (×11): qty 1

## 2012-01-17 NOTE — Progress Notes (Signed)
Subjective: Post op day 2. Pain in left shoulder that radiates to the arm. She has required 6mg  morphine, 6 tabs percocet and 1 injection of dilaudid and has been feeling groggy. Feels some weakness in left hand too. Headache has improved since surgery. Urinary retention yesterday. on bladder scan. 900 mls s/p in and out cath. Has since then had 550 cc output.  Objective: Vital signs in last 24 hours: Temp:  [98.2 F (36.8 C)-98.9 F (37.2 C)] 98.5 F (36.9 C) (05/17 0511) Pulse Rate:  [73-99] 99  (05/17 0511) Resp:  [18-20] 20  (05/17 0511) BP: (100-144)/(48-78) 144/78 mmHg (05/17 0511) SpO2:  [93 %-100 %] 100 % (05/17 0511) Weight change:  Last BM Date: 01/13/12  Intake/Output from previous day: 05/16 0701 - 05/17 0700 In: 500 [P.O.:480; I.V.:20] Out: 1475 [Urine:1450; Drains:25] Intake/Output this shift:   Physical Exam: General appearance: alert, but laying stiff in bed.  Resp: clear to auscultation bilaterally Cardio: S1S2, rrr, 2/6 systolic murmur GI: soft, non-tender; bowel sounds normal; no masses,  no organomegaly Extremities: extremities normal, atraumatic, no cyanosis or edema Neurologic: CN2-12 grossly intact, decreased strength in left arm. 4/5 strength in lower extremities.   Lab Results:  Basename 01/17/12 0640 01/16/12 0904  WBC 11.7* 12.7*  HGB 10.2* 11.1*  HCT 32.6* 35.1*  PLT 175 200   BMET  Basename 01/17/12 0640 01/16/12 0904  NA 135 140  K 4.1 4.0  CL 97 103  CO2 29 28  GLUCOSE 152* 178*  BUN 13 12  CREATININE 0.67 0.73  CALCIUM 9.6 9.4    Studies/Results: No results found.  Medications:     . acyclovir  400 mg Oral BID  . allopurinol  300 mg Oral Daily  . azelastine  2 spray Each Nare Daily  . brimonidine  1 drop Left Eye BID  . celecoxib  200 mg Oral BID  . dexamethasone  4 mg Intravenous Q6H  . fluticasone  2 puff Inhalation BID  . furosemide  20 mg Oral Daily  . gabapentin  300 mg Oral TID  . hydrochlorothiazide  12.5  mg Oral Daily  . insulin aspart  0-9 Units Subcutaneous TID WC  . insulin glargine  20 Units Subcutaneous QHS  . latanoprost  1 drop Both Eyes QHS  . losartan  50 mg Oral Daily  . metFORMIN  1,000 mg Oral BID AC  . metoprolol succinate  50 mg Oral BID  . montelukast  10 mg Oral QHS  . pantoprazole  40 mg Oral Daily  . polyethylene glycol  17 g Oral Daily  . potassium chloride  20 mEq Oral Daily  . pregabalin  100 mg Oral Daily  . pregabalin  200 mg Oral QHS  . sertraline  150 mg Oral QHS  . simvastatin  40 mg Oral q morning - 10a  . sodium chloride  3 mL Intravenous Q12H  . sodium chloride  3 mL Intravenous Q12H  . sodium chloride  3 mL Intravenous Q12H  . Vitamin D (Ergocalciferol)  50,000 Units Oral Q7 days    Assessment/Plan: 61 yo female with fall 1 month ago who has been seen at clinic for post concussive syndrome and had witnessed syncopal event.  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 Anterior cervical arthrodesis C3-4 C4-5 C5-6 and C6-7 utilizing cortico-cancellus allografts, 3. Anterior cervical plating C3-C7 inclusive utilizing a [employer] translational plate -  pain in arm suspected to be from radiculitis. Plan by surgery to start NSAID's and start steroids.  - continue PT/OT - continue IV dilaudid for pain control  # urinary retention: resolved, but will continue to monitor  # Syncope: unclear etiology although this could have been secondary to hypoglycemia. Brain MRI did not show any acute infarct. It showed chronic microvascular ischemia and large central and left sided disc protrusion C3-C4 with possible cord compression which would not be cause for syncopal event. Cardiac enzymes have been negative x2. There is no evidence of electrolyte imbalance. Echo shows EF: 55-60% with mild mitral regurgitation and aortic sclerosis which should not cause syncope. TSH was normal at 1.325.  Patient not orthostatic.  - no new episodes since admission.  - monitor glucose for hypoglycemia  # DM- back on metformin and lantus 20 qhs since surgery. CBG's: 100-178 Will discharge on lantus 20 instead of 40u like she was previously, per diabetic counselor's recommendations. She has been well controled on this dose in the hospital.  - monitor hypoglycemia  CBG (last 3)   Basename 01/17/12 0635 01/16/12 2150 01/16/12 1609  GLUCAP 152* 100* 127*   # HTN- stable s/p surgery. Continue home regimen: lasix 20 qd, hctz 12.5 qd, losartan 50 and metoprolol 50 bid # HLD- Continue simvastatin 40  #Depression- Continue Zoloft. 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 No evidence of lesions, per patient.  # GERD- Continue PPI  # PPX-  Heparin SQ for DVT Prophylaxis. # Dispo- Pending recovery from surgery. Has not participated with PT yet. Will follow recs on best rehab options.    LOS: 9 days   [name] [name] 01/17/2012, 9:21 AM

## 2012-01-17 NOTE — Progress Notes (Signed)
Continued review is complete for today's date.

## 2012-01-17 NOTE — Progress Notes (Signed)
Occupational Therapy Treatment Patient Details Name: [name] [name] MRN: [id number] DOB: [birth year] Today's Date: 01/17/2012 Time: 4098-1191 OT Time Calculation (min): 23 min  OT Assessment / Plan / Recommendation Comments on Treatment Session Minimal decline this session secondary to "grogginess"/lethargy. Will continue to assess for appropriate d/c plan    Follow Up Recommendations  Home health OT    Barriers to Discharge       Equipment Recommendations  3 in 1 bedside comode                   Precautions / Restrictions Precautions Precautions: Fall Required Braces or Orthoses: Cervical Brace Cervical Brace: Soft collar Restrictions Weight Bearing Restrictions: No   Pertinent Vitals/Pain Pt with c/o neck discomfort; repositioned    ADL  Grooming: Performed;Wash/dry hands;Minimal assistance Where Assessed - Grooming: Unsupported standing Toilet Transfer: Performed;Minimal Dentist Method: Sit to Barista: Regular height toilet;Grab bars Toileting - Clothing Manipulation and Hygiene: Performed;Min guard Where Assessed - Engineer, mining and Hygiene: Standing Toileting - Hygiene: Performed;Set up;Supervision/safety Where Assessed - Toileting Hygiene: Sit on 3-in-1 or toilet ADL Comments: educated pt on cervical precautions.     OT Diagnosis:    OT Problem List:   OT Treatment Interventions:     OT Goals ADL Goals ADL Goal: Grooming - Progress: Progressing toward goals ADL Goal: Toilet Transfer - Progress: Progressing toward goals ADL Goal: Toileting - Clothing Manipulation - Progress: Progressing toward goals ADL Goal: Toileting - Hygiene - Progress: Progressing toward goals  Visit Information  Last OT Received On: 01/17/12 Assistance Needed: +1 PT/OT Co-Evaluation/Treatment: Yes    Subjective Data      Prior Functioning       Cognition  Overall Cognitive Status: Appears within functional limits  for tasks assessed/performed Arousal/Alertness: Awake/alert Orientation Level: Appears intact for tasks assessed Behavior During Session: [facility] for tasks performed    Mobility Bed Mobility Bed Mobility: Rolling Right;Right Sidelying to Sit;Sitting - Scoot to Edge of Bed Rolling Right: 4: Min assist Right Sidelying to Sit: 3: Mod assist Sitting - Scoot to Edge of Bed: 4: Min assist Details for Bed Mobility Assistance: VC for sequencing to maintain cervical precautions. Assist with trunk and pelvis into sitting Transfers Sit to Stand: 4: Min assist Stand to Sit: 4: Min assist Details for Transfer Assistance: VC for sequencing and hand placement. Pt digressed from Min guard A with RW ambulation to Min A towards end of session secondary to fatigue/lethargy   Exercises    Balance    End of Session OT - End of Session Equipment Utilized During Treatment: Gait belt Activity Tolerance: Patient tolerated treatment well Patient left: in chair;with call bell/phone within reach   Raisa Ditto 01/17/2012, 3:19 PM

## 2012-01-17 NOTE — Progress Notes (Signed)
Patient ID: [name] [name], female   DOB: [birth year], 61 y.o.   MRN: [id number] Subjective: Patient reports neck soreness into shoulders which is quite appropriate and common after ACDF, but still c/o LUE pain esp with mvmt. No new N/T/W.  Objective: Vital signs in last 24 hours: Temp:  [98.2 F (36.8 C)-98.9 F (37.2 C)] 98.5 F (36.9 C) (05/17 0511) Pulse Rate:  [73-99] 99  (05/17 0511) Resp:  [18-20] 20  (05/17 0511) BP: (100-144)/(48-78) 144/78 mmHg (05/17 0511) SpO2:  [93 %-100 %] 100 % (05/17 0511)  Intake/Output from previous day: 05/16 0701 - 05/17 0700 In: 500 [P.O.:480; I.V.:20] Out: 1475 [Urine:1450; Drains:25] Intake/Output this shift:    Neurologic: Grossly normal, mild myelopathy with generalized mild weakness, unchanged.  Lab Results: Lab Results  Component Value Date   WBC 11.7* 01/17/2012   HGB 10.2* 01/17/2012   HCT 32.6* 01/17/2012   MCV 88.8 01/17/2012   PLT 175 01/17/2012   Lab Results  Component Value Date   INR 0.94 01/14/2012   BMET Lab Results  Component Value Date   NA 135 01/17/2012   K 4.1 01/17/2012   CL 97 01/17/2012   CO2 29 01/17/2012   GLUCOSE 152* 01/17/2012   BUN 13 01/17/2012   CREATININE 0.67 01/17/2012   CALCIUM 9.6 01/17/2012    Studies/Results: Dg Cervical Spine 1 View  01/16/2012  *RADIOLOGY REPORT*  Clinical Data: Cervical fusion.  DG CERVICAL SPINE - 1 VIEW  Comparison: 01/15/2012.  Findings: Anterior plate and screws and interbody bone plugs are fusing C3-C7.  No complicating features are demonstrated.  IMPRESSION: C3-C7 fusion.  Original Report Authenticated By: P. Loralie Champagne, M.D.   Dg Cervical Spine 1 View  01/15/2012  *RADIOLOGY REPORT*  Clinical Data: Neck pain  DG C-ARM 1-60 MIN,DG CERVICAL SPINE - 1 VIEW  Comparison: MRI cervical spine 01/09/2012.  Findings: The patient has undergone C3-C7 ACDF with anterior plating.  Grossly satisfactory position and alignment.  IMPRESSION: As above.  Original Report Authenticated By:  Elsie Stain, M.D.   Ct Cervical Spine Wo Contrast  01/16/2012  *RADIOLOGY REPORT*  Clinical Data: Status post cervical fusion.  Neck and left arm pain with weakness.  CT CERVICAL SPINE WITHOUT CONTRAST  Technique:  Multidetector CT imaging of the cervical spine was performed. Multiplanar CT image reconstructions were also generated.  Comparison: 01/09/2012 MRI.  Findings: Patient is status post C3-C7 ACDF.  The interbody bone plugs are appropriately located in the interspace from C3-4 through C6-7.  The hardware is intact and appropriately placed except for the left C5 screw which is displaced inferiorly into the interspace.  This results in a slight gap of 2 mm through the superior margin of the plug and the inferior endplate of C5.  Within limits of visualization by CT, no significant intraspinal hematoma.  No neck masses.  Mild carotid calcification.  Lung apices clear.  The individual disc spaces were examined as follows:  C2-3:  Advanced uncinate spurring on the right with foraminal narrowing.  Potential right C3 nerve root encroachment.  C3-4:   Unremarkable post fusion appearance.  C4-5:  Unremarkable post fusion appearance.  C5-6:  Slight residual uncinate spurring on the left. Left C6 nerve root encroachment is likely.  Please see comments above regarding inferiorly displaced left C5 screw.  C6-7:  Unremarkable post fusion interspace.  C7-T1:  Negative.  IMPRESSION: Inferiorly displaced left C5 screw displaces the superior margin of bone plug away from the inferior endplate of C5.  Residual uncinate spurring C5-6, left.  Left C6 nerve root encroachment not excluded.  Original Report Authenticated By: Elsie Stain, M.D.   Dg C-arm 1-60 Min  01/15/2012  *RADIOLOGY REPORT*  Clinical Data: Neck pain  DG C-ARM 1-60 MIN,DG CERVICAL SPINE - 1 VIEW  Comparison: MRI cervical spine 01/09/2012.  Findings: The patient has undergone C3-C7 ACDF with anterior plating.  Grossly satisfactory position and  alignment.  IMPRESSION: As above.  Original Report Authenticated By: Elsie Stain, M.D.    Assessment/Plan: CT reviewed and it looks ok to me. One C5 screw angles inferiorly, but causes no complication in my opinion. Suspect radiculitis. Will start steroids and NSAIDS. PT/OT.   LOS: 9 days    [name] [name] S 01/17/2012, 7:57 AM

## 2012-01-17 NOTE — Progress Notes (Signed)
FMTS Attending Note  Patient seen and examined by me; continues with neck pain POD#2.  Working with physical therapy.   Continues with good glycemic control.  Will clarify neurosurgery discharge plan.  Patient believes she will be discharged to home (not rehab or SNF). Paula Compton, MD

## 2012-01-17 NOTE — Progress Notes (Signed)
Physical Therapy Treatment Patient Details Name: [name] [name] MRN: [id number] DOB: Apr 29, 1951 Today's Date: 01/17/2012 Time: 4098-1191 PT Time Calculation (min): 23 min  PT Assessment / Plan / Recommendation Comments on Treatment Session  Pt s/p ACDF C3-7 5/15. Pt with minimal decline due to grogginess today(potentially from medicine). Will continue to reevaluate in future sessions for safety.     Follow Up Recommendations  Home health PT;Supervision for mobility/OOB    Barriers to Discharge        Equipment Recommendations  3 in 1 bedside comode    Recommendations for Other Services    Frequency Min 3X/week   Plan Discharge plan remains appropriate;Frequency remains appropriate    Precautions / Restrictions Precautions Precautions: Fall;Other (comment)       Mobility  Bed Mobility Bed Mobility: Rolling Right;Right Sidelying to Sit;Sitting - Scoot to Edge of Bed Rolling Right: 4: Min assist Right Sidelying to Sit: 3: Mod assist Sitting - Scoot to Edge of Bed: 4: Min assist Details for Bed Mobility Assistance: VC for sequencing to maintain cervical precautions. Assist with trunk and pelvis into sitting Transfers Transfers: Sit to Stand;Stand to Sit Sit to Stand: 4: Min assist Stand to Sit: 4: Min assist Details for Transfer Assistance: VC for sequencing and hand placement Ambulation/Gait Ambulation/Gait Assistance: 4: Min assist;4: Min guard Ambulation Distance (Feet): 75 Feet Assistive device: Rolling walker Ambulation/Gait Assistance Details: Min assist towards end of ambulation secondary to assist with holding RW due to LUE weakness Gait Pattern: Step-through pattern;Decreased stride length Stairs: No        PT Goals Acute Rehab PT Goals PT Goal Formulation: With patient PT Goal: Supine/Side to Sit - Progress: Progressing toward goal PT Goal: Sit to Supine/Side - Progress: Progressing toward goal PT Goal: Sit to Stand - Progress: Progressing toward goal PT  Goal: Stand to Sit - Progress: Progressing toward goal PT Goal: Ambulate - Progress: Progressing toward goal  Visit Information  Last PT Received On: 01/17/12 Assistance Needed: +1 PT/OT Co-Evaluation/Treatment: Yes    Subjective Data      Cognition  Overall Cognitive Status: Appears within functional limits for tasks assessed/performed Arousal/Alertness: Awake/alert Orientation Level: Appears intact for tasks assessed Behavior During Session: [facility] for tasks performed    Balance     End of Session PT - End of Session Equipment Utilized During Treatment: Gait belt;Cervical collar Activity Tolerance: Patient tolerated treatment well Patient left: in chair;with call bell/phone within reach Nurse Communication: Mobility status    Milana Kidney 01/17/2012, 1:00 PM  01/17/2012 Milana Kidney DPT PAGER: [phone] OFFICE: [phone]

## 2012-01-18 LAB — BASIC METABOLIC PANEL
BUN: 15 mg/dL (ref 6–23)
CO2: 31 mEq/L (ref 19–32)
Calcium: 10.6 mg/dL — ABNORMAL HIGH (ref 8.4–10.5)
Chloride: 100 mEq/L (ref 96–112)
Creatinine, Ser: 0.53 mg/dL (ref 0.50–1.10)
GFR calc Af Amer: >90 mL/min (ref >90)
GFR calc non Af Amer: >90 mL/min (ref >90)
Glucose, Bld: 190 mg/dL — ABNORMAL HIGH (ref 70–99)
Potassium: 4.1 mEq/L (ref 3.5–5.1)
Sodium: 141 mEq/L (ref 135–145)

## 2012-01-18 LAB — GLUCOSE, CAPILLARY
Glucose-Capillary: 154 mg/dL — ABNORMAL HIGH (ref 70–99)
Glucose-Capillary: 172 mg/dL — ABNORMAL HIGH (ref 70–99)
Glucose-Capillary: 109 mg/dL — ABNORMAL HIGH (ref 70–99)
Glucose-Capillary: 122 mg/dL — ABNORMAL HIGH (ref 70–99)

## 2012-01-18 NOTE — Discharge Summary (Signed)
Physician Discharge Summary   Patient ID: [name] [name] [id number] 61 y.o. 1950/12/12  Admit date: 01/08/2012  Discharge date and time: No discharge date for patient encounter.   Admitting Physician: Nestor Ramp, MD   Discharge Physician: Carney Living  Admission Diagnoses: Syncope  Discharge Diagnoses:  1. Cervical spondylosis with cervical spinal stenosis at C3-4 C4-5 C5-6 and C6-7, with cervical spondylitic myelopathy - resolved 2. Diabetes 3. Hypertension 4. Hyperlipidemia 5. Depression 6. Herpes genitales 7. GERD 8. Low vitamin D  Admission Condition: fair  Discharged Condition: good  Indication for Admission: presyncopal event and head MRI showing spinal cord compression  Hospital Course:  61 yo female with fall 6 weeks prior to current admission who was admitted for witnessed pre-syncopal event and was found on MRI to have severe cervical stenosis with cervical spondylitic myelopathy for which she underwent surgery.   # Cervical spondylosis with cervical spinal stenosis:  Brain MRI showed spinal stenosis with large disc herniation with severe cord compression. This was confirmed on C-spine MRI which showed large extruded disc fragment at C3-4 with upgoing disc material as well as cord compression and edema. Moderate spinal stenosis was also seen at C4-5, C5-6 and C6-7. Prior to surgery, patient's airway was evaluated by ENT and showed no laryngeal or vocal cord abnormality. On 01/15/12, patient successfully underwent decompressive anterior cervical discectomy C3-4 C4-5 C5-6 and C6-7, 2. anterior cervical arthrodesis C3-4 C4-5 C5-6 and C6-7 and anterior cervical plating C3-C7. Post op she complained of left shoulder and arm pain thought to be from radiculitis which had greatly improved on discharge. Pain was controled with tramadol on discharge and did not require any opiates.   # Syncopal episode:   Patient had episode in clinic where she was witnessed losing  consciousness and falling backwards. Upon presentation, blood sugar and blood pressure were within normal range, although she did report not eating that morning and taking her regular dose of 40u lantus. The etiology of this event was unclear but could have been related to hypoglycemia. Brain MRI did not show any acute infarct and cervical spinal stenosis would not be a cause of syncope. Cardiac enzymes were negative. There was no evidence of electrolyte imbalance. Echo shows EF: 55-60% with mild mitral regurgitation and aortic sclerosis which should not cause syncope. TSH was normal at 1.325. Patient was not orthostatic. Patient did not have any other episodes while in the hospital.   # DM- blood glucose was well controled on metformin and lantus 20qhs. She had been on lantus 40 at home, but this was reduced, given good glycemic control. Blood glucose on day of discharge was: 100-110. CBG (last 3)   Basename 01/20/12 2149 01/20/12 1625 01/20/12 0707  GLUCAP 110* 100* 90   # HTN- stable s/p surgery. Was on lasix 20 qd, hctz 12.5 qd, losartan 50 and metoprolol 50 bid during hospitalization. BP on discharge was 101-132/63-69 # HLD- Continue simvastatin 40  #Depression- stable on sertraline 150mg  daily   # Herpes genitales: continue Acyclovir 400 BID. Per patient, no evidence of lesions.  # GERD- stable on pntoprazole 40mg  daily.  # low vitamin D: Vit D25 hydroxy was 19. Started on ergocalciferol weekly on 05/ 09/13.   Consults:  Neurosurgery: Dr. Yetta Barre ENT (consulted prior to surgery to evaluate airway, due to patient's prior history of vocal cord paralysis) PT/OT  Significant Diagnostic Studies:   CHEST - 2 VIEW 01/08/2012:  Comparison: CT chest 02/11/2011, 12/28/2009. Two-view chest x-ray  08/17/2010 [facility].  Findings: Cardiac silhouette upper normal in size to with slightly  enlarged but stable. Mild thoracic aortic atherosclerosis. Hilar  and mediastinal contours otherwise  unremarkable. Lungs clear.  Bronchovascular markings normal. Pulmonary vascularity normal. No  pneumothorax. No pleural effusions. Degenerative changes  involving the thoracic spine.  IMPRESSION:  Stable borderline to mild cardiomegaly. No acute cardiopulmonary  disease.   2D Echo: 01/09/12 - Left ventricle: The cavity size was normal. Wall thickness was increased in a pattern of mild LVH. Systolic function was normal. The estimated ejection fraction was in the range of 55% to 60%. Wall motion was normal; there were no regional wall motion abnormalities. Doppler parameters are consistent with abnormal left ventricular relaxation (grade 1 diastolic dysfunction). - Aortic valve: Sclerosis without stenosis. - Mitral valve: Mild regurgitation. - Right ventricle: The cavity size was normal. Systolic function was normal. - Tricuspid valve: Peak RV-RA gradient:43mm Hg (S). - Pulmonary arteries: PA peak pressure:68mm Hg (S). - Inferior vena cava: The vessel was normal in size; the respirophasic diameter changes were in the normal range (= 50%); findings are consistent with normal central venous pressure. Impressions:  - Normal LV size and systolic function, EF 55-60%. Mild LV hypertrophy. Normal RV size and systolic function. Mild mitral regurgitation. Aortic sclerosis without significant stenosis. Moderate pulmonary hypertension. Transthoracic echocardiography. M-mode, complete 2D, spectral Doppler, and color Doppler. Height: Height: 154.9cm. Height: 61in. Weight: Weight: 82kg. Weight: 180.4lb. Body mass index: BMI: 34.2kg/m^2. Body surface area: BSA: 1.56m^2. Blood pressure: 156/78. Patient status: Inpatient. Location: Echo laboratory.  MRI HEAD WITHOUT CONTRAST 01/08/12 Technique: Multiplanar, multiecho pulse sequences of the brain and  surrounding structures were obtained according to standard protocol  without intravenous contrast.  Comparison: CT head 11/18/2011  Findings:  Negative for acute infarct. Scattered small  hyperintensities in the cerebral white matter bilaterally are  typical for chronic microvascular ischemia. Patchy hyperintensity  in the pons compatible with chronic microvascular ischemia. No  cortical infarct. Negative for hemorrhage or mass lesion.  Large disc protrusion at C3-4 with apparent compression of the cord  on the left. Cervical MRI is suggested for further evaluation.  IMPRESSION:  Chronic microvascular ischemia. No acute infarct.  Large central and left-sided disc protrusion C3-4 with possible  cord compression. Recommend cervical MRI.  MRI CERVICAL SPINE WITHOUT AND WITH CONTRAST  Technique: Multiplanar and multiecho pulse sequences of the  cervical spine, to include the craniocervical junction and  cervicothoracic junction, were obtained according to standard  protocol without and with intravenous contrast.  Contrast: 17mL MULTIHANCE GADOBENATE DIMEGLUMINE 529 MG/ML IV SOLN  Comparison: MRI head 01/08/2012  Findings: Normal cervical alignment with mild kyphosis present.  Negative for fracture or mass lesion. No enhancing lesions are  identified.  C2-3: Disc degeneration with mild spurring and mild spinal  stenosis.  C3-4: Large extruded disc fragment central and left-sided with  upgoing disc material. This is causing marked compression of the  spinal cord on the left. There is cord edema present.  C4-5: Disc degeneration and spondylosis with flattening of the  cord and moderate spinal stenosis. Foraminal narrowing bilaterally  due to spurring.  C5-6: Central disc and osteophyte causing mild cord compression  and moderate spinal stenosis. Foraminal narrowing bilaterally due  to spurring.  C6-7: Disc degeneration with spondylosis. There is cord  flattening and moderate spinal stenosis. Foraminal stenosis  present bilaterally due to spurring. Chronic-appearing  hyperintensity in the spinal cord is present.  C7-T1:  Negative  IMPRESSION:  Severe multilevel degenerative  change in the cervical spine.  Large extruded disc fragment at C3-4 with upgoing disc material.  There is cord compression and cord edema. Moderate spinal stenosis  at C4-5, C5-6 and C6-7.  CT CERVICAL SPINE WITHOUT CONTRAST 01/16/12 Technique: Multidetector CT imaging of the cervical spine was  performed. Multiplanar CT image reconstructions were also  generated.  Comparison: 01/09/2012 MRI.  Findings: Patient is status post C3-C7 ACDF. The interbody bone  plugs are appropriately located in the interspace from C3-4 through  C6-7. The hardware is intact and appropriately placed except for  the left C5 screw which is displaced inferiorly into the  interspace. This results in a slight gap of 2 mm through the  superior margin of the plug and the inferior endplate of C5.  Within limits of visualization by CT, no significant intraspinal  hematoma.  No neck masses. Mild carotid calcification. Lung apices clear.  The individual disc spaces were examined as follows:  C2-3: Advanced uncinate spurring on the right with foraminal  narrowing. Potential right C3 nerve root encroachment.  C3-4: Unremarkable post fusion appearance.  C4-5: Unremarkable post fusion appearance.  C5-6: Slight residual uncinate spurring on the left. Left C6 nerve  root encroachment is likely. Please see comments above regarding  inferiorly displaced left C5 screw.  C6-7: Unremarkable post fusion interspace.  C7-T1: Negative.  IMPRESSION:  Inferiorly displaced left C5 screw displaces the superior margin of  bone plug away from the inferior endplate of C5.  Residual uncinate spurring C5-6, left. Left C6 nerve root  encroachment not excluded.   Lipid Panel     Component Value Date/Time   CHOL 207* 07/03/2011 1106   TRIG 165* 07/03/2011 1106   HDL 36* 07/03/2011 1106   CHOLHDL 5.8 07/03/2011 1106   VLDL 33 07/03/2011 1106   LDLCALC 138* 07/03/2011 1106    TSH: 1.325 A1C: 6.8 from 12/11/11  CBC    Component Value Date/Time   WBC 11.7* 01/17/2012 0640   RBC 3.67* 01/17/2012 0640   HGB 10.2* 01/17/2012 0640   HCT 32.6* 01/17/2012 0640   PLT 175 01/17/2012 0640   MCV 88.8 01/17/2012 0640   MCH 27.8 01/17/2012 0640   MCHC 31.3 01/17/2012 0640   RDW 15.2 01/17/2012 0640   LYMPHSABS 2.1 01/17/2012 0640   MONOABS 1.0 01/17/2012 0640   EOSABS 0.1 01/17/2012 0640   BASOSABS 0.0 01/17/2012 0640    CMP     Component Value Date/Time   NA 141 01/18/2012 0605   K 4.1 01/18/2012 0605   CL 100 01/18/2012 0605   CO2 31 01/18/2012 0605   GLUCOSE 190* 01/18/2012 0605   BUN 15 01/18/2012 0605   CREATININE 0.53 01/18/2012 0605   CREATININE 0.64 07/03/2011 1106   CALCIUM 10.6* 01/18/2012 0605   PROT 6.2 01/08/2012 1956   ALBUMIN 3.1* 01/08/2012 1956   AST 19 01/08/2012 1956   ALT 18 01/08/2012 1956   ALKPHOS 95 01/08/2012 1956   BILITOT 0.1* 01/08/2012 1956   GFRNONAA >90 01/18/2012 0605   GFRAA >90 01/18/2012 0605   Treatments:  IV dilaudid IV morphine  Discharge Exam: Filed Vitals:   01/20/12 2147 01/20/12 2200 01/21/12 0219 01/21/12 0600  BP: 127/55 127/55 118/54 132/69  Pulse: 74 74 77 76  Temp:  98.1 F (36.7 C) 97.9 F (36.6 C) 96.8 F (36 C)  TempSrc:  Oral Oral   Resp:  18 16 18   Height:      Weight:      SpO2:  98%  98% 93%   General appearance: alert and oriented, comfortable sitting in the chair, no acute distress HEENT: moist mucous membranes, PEERLA, EOMI, thick glasses in place.  Resp: clear to auscultation bilaterally  Cardio: S1S2, rrr, 2/6 systolic murmur  GI: soft, non-tender; bowel sounds normal; no masses, no organomegaly  Extremities: extremities normal, atraumatic, no cyanosis or edema  Neurologic: CN2-12 grossly intact, decreased strength in left hand and arm but improved from yesterday's exam. Has difficulty raising left arm completely above her head. 5/5 strength in right arm and fist. Decreased strength in left leg from previous  injury. Brisk brachioradialis reflexes bilaterally.   Skin: neck on right side: steristrips in place where drainage tube was after surgery. Clean and dry. No discharge or erythema.   Disposition: 03-Skilled Nursing Facility  Patient Instructions:  Medication List  As of 01/21/2012  1:38 PM   STOP taking these medications         losartan-hydrochlorothiazide 100-25 MG per tablet      potassium chloride 40 MEQ/15ML (20%) Liqd         TAKE these medications         acetaminophen 325 MG tablet   Commonly known as: TYLENOL   Take 2 tablets (650 mg total) by mouth every 4 (four) hours as needed for pain.      acyclovir 400 MG tablet   Commonly known as: ZOVIRAX   Take 400 mg by mouth 2 (two) times daily.      albuterol 108 (90 BASE) MCG/ACT inhaler   Commonly known as: PROVENTIL HFA;VENTOLIN HFA   Inhale 2 puffs into the lungs every 4 (four) hours as needed. For shortness of breath      allopurinol 300 MG tablet   Commonly known as: ZYLOPRIM   Take 300 mg by mouth daily.      arformoterol 15 MCG/2ML Nebu   Commonly known as: BROVANA   Take 15 mcg by nebulization 2 (two) times daily as needed. For shortness of breath      aspirin EC 81 MG tablet   Take 325 mg by mouth daily. 325=4 tablets      brimonidine 0.1 % Soln   Commonly known as: ALPHAGAN P   Place 1 drop into the left eye 2 (two) times daily.      celecoxib 200 MG capsule   Commonly known as: CELEBREX   Take 1 capsule (200 mg total) by mouth 2 (two) times daily.      docusate sodium 100 MG capsule   Commonly known as: COLACE   Take 100 mg by mouth 2 (two) times daily as needed. For constipation      esomeprazole 40 MG capsule   Commonly known as: NEXIUM   Take 40 mg by mouth 2 (two) times daily.      furosemide 20 MG tablet   Commonly known as: LASIX   Take 20 mg by mouth daily.      gabapentin 300 MG capsule   Commonly known as: NEURONTIN   Take 300 mg by mouth 3 (three) times daily.       hydrochlorothiazide 12.5 MG capsule   Commonly known as: MICROZIDE   Take 1 capsule (12.5 mg total) by mouth daily.      insulin glargine 100 UNIT/ML injection   Commonly known as: LANTUS   Inject 20 Units into the skin at bedtime.      latanoprost 0.005 % ophthalmic solution   Commonly known as: XALATAN   Place 1  drop into both eyes at bedtime.      losartan 50 MG tablet   Commonly known as: COZAAR   Take 1 tablet (50 mg total) by mouth daily.      menthol-cetylpyridinium 3 MG lozenge   Commonly known as: CEPACOL   Take 1 lozenge (3 mg total) by mouth as needed (sore throat).      metFORMIN 1000 MG tablet   Commonly known as: GLUCOPHAGE   Take 1,000 mg by mouth 2 (two) times daily. Take 1.5 tablet in the morning and 1 tablet in the evening      metoprolol succinate 50 MG 24 hr tablet   Commonly known as: TOPROL-XL   Take 50 mg by mouth 2 (two) times daily. Take with or immediately following a meal.      montelukast 10 MG tablet   Commonly known as: SINGULAIR   Take 10 mg by mouth daily.      nitroGLYCERIN 0.4 MG SL tablet   Commonly known as: NITROSTAT   Place 0.4 mg under the tongue every 5 (five) minutes as needed.      polyethylene glycol packet   Commonly known as: MIRALAX / GLYCOLAX   Take 17 g by mouth daily as needed. For constipation      pregabalin 100 MG capsule   Commonly known as: LYRICA   Take 100-200 mg by mouth 2 (two) times daily. 1 tablet in the AM and 2 tablets at bedtime.      sertraline 100 MG tablet   Commonly known as: ZOLOFT   Take 150 mg by mouth at bedtime.      simvastatin 40 MG tablet   Commonly known as: ZOCOR   Take 40 mg by mouth daily.      traMADol 50 MG tablet   Commonly known as: ULTRAM   Take 50-100 mg by mouth every 8 (eight) hours as needed. For joint pain.      Vitamin D (Ergocalciferol) 50000 UNITS Caps   Commonly known as: DRISDOL   Take 1 capsule (50,000 Units total) by mouth every 7 (seven) days.            Activity: no heavy lifiting and no abrupt movement of head until folow up with neurosurgery in 2 weeks.  Diet: diabetic diet Wound Care: remove steri-strips from drainage placement once incision healed.  Precautions: fall precautions and cervical brace per PT  Follow up with Dr. Jennette Kettle after discharge from rehab.  Follow up with Dr. Yetta Barre with neurosurgery in 2 weeks from discharge from hospital.   Follow up items: - radiculitis in left arm - vitamin D level in 8 weeks of treatment.  - diabetes on current regimen of lantus 20 and metformin 1000bid - blood pressure on current regimen.   SignedMarena Chancy 01/21/2012 1:38 PM

## 2012-01-18 NOTE — Progress Notes (Signed)
Subjective: Neck and arm pain improved from yesterday. Still feeling some weakness in her arm. Otherwise doing well. Headache has improved. Objective: Vital signs in last 24 hours: Temp:  [97.3 F (36.3 C)-99 F (37.2 C)] 97.6 F (36.4 C) (05/18 0555) Pulse Rate:  [69-89] 72  (05/18 0555) Resp:  [18-20] 18  (05/18 0555) BP: (114-157)/(57-79) 157/79 mmHg (05/18 0555) SpO2:  [92 %-97 %] 97 % (05/18 0555) Weight change:  Last BM Date: 01/13/12  Intake/Output from previous day: 05/17 0701 - 05/18 0700 In: 720 [P.O.:720] Out: 1775 [Urine:1775] Intake/Output this shift:   Physical Exam: General appearance: alert and oriented, comfortable sitting in the chair.  Resp: clear to auscultation bilaterally Cardio: S1S2, rrr, 2/6 systolic murmur GI: soft, non-tender; bowel sounds normal; no masses,  no organomegaly Extremities: extremities normal, atraumatic, no cyanosis or edema Neurologic: CN2-12 grossly intact, decreased strength in left hand and arm. 3/5 brachioradialis reflex in left, 2/5 in right.   Lab Results:  Basename 01/17/12 0640 01/16/12 0904  WBC 11.7* 12.7*  HGB 10.2* 11.1*  HCT 32.6* 35.1*  PLT 175 200   BMET  Basename 01/18/12 0605 01/17/12 0640  NA 141 135  K 4.1 4.1  CL 100 97  CO2 31 29  GLUCOSE 190* 152*  BUN 15 13  CREATININE 0.53 0.67  CALCIUM 10.6* 9.6    Studies/Results: No results found.  Medications:     . acyclovir  400 mg Oral BID  . allopurinol  300 mg Oral Daily  . azelastine  2 spray Each Nare Daily  . brimonidine  1 drop Left Eye BID  . celecoxib  200 mg Oral BID  . dexamethasone  4 mg Intravenous Q6H  . fluticasone  2 puff Inhalation BID  . furosemide  20 mg Oral Daily  . gabapentin  300 mg Oral TID  . hydrochlorothiazide  12.5 mg Oral Daily  . insulin aspart  0-9 Units Subcutaneous TID WC  . insulin glargine  20 Units Subcutaneous QHS  . latanoprost  1 drop Both Eyes QHS  . losartan  50 mg Oral Daily  . metFORMIN  1,000 mg  Oral BID AC  . metoprolol succinate  50 mg Oral BID  . montelukast  10 mg Oral QHS  . pantoprazole  40 mg Oral Daily  . polyethylene glycol  17 g Oral Daily  . potassium chloride  20 mEq Oral Daily  . pregabalin  100 mg Oral Daily  . pregabalin  200 mg Oral QHS  . sertraline  150 mg Oral QHS  . simvastatin  40 mg Oral q morning - 10a  . sodium chloride  3 mL Intravenous Q12H  . sodium chloride  3 mL Intravenous Q12H  . sodium chloride  3 mL Intravenous Q12H  . Vitamin D (Ergocalciferol)  50,000 Units Oral Q7 days    Assessment/Plan: 61 yo female with fall 1 month ago who has been seen at clinic for post concussive syndrome and had witnessed syncopal event.  # Left sided cord compression at C3-C4:  Post op day 3 s/p: Decompressive anterior cervical discectomy C3-4 C4-5 C5-6 and C6-7, 2. Anterior cervical arthrodesis C3-4 C4-5 C5-6 and C6-7 utilizing cortico-cancellus allografts, 3. Anterior cervical plating C3-C7 inclusive utilizing a [employer] translational plate - pain in arm suspected to be from radiculitis. Currently on celecoxib and dexamethasone day 2. Will follow up on neuro surgery recommendations for steroid course.   - continue PT/OT - pain control: not requiring dilaudid. Continue morphine and percocet  as needed  # Syncope: unclear etiology although this could have been secondary to hypoglycemia.  - no new episodes since admission.  - no recent hypoglycemia  # DM- back on metformin and lantus 20 qhs since surgery. CBG's: 150-183 - continue current regimen  CBG (last 3)   Basename 01/18/12 0709 01/17/12 2051 01/17/12 1643  GLUCAP 154* 183* 171*   # HTN- stable s/p surgery. Continue home regimen: lasix 20 qd, hctz 12.5 qd, losartan 50 and metoprolol 50 bid # HLD- Continue simvastatin 40  #Depression- Continue Zoloft. 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 No evidence of lesions, per patient.  # GERD- Continue PPI  # PPX-  Heparin SQ for DVT Prophylaxis. #  Dispo- PT/OT recommends home health PT/OT. Patient does not have 24hr caretaker at home and per her PCP, Dr. Jennette Kettle, may benefit from short term rehab.    LOS: 10 days   [name] [name] 01/18/2012, 8:42 AM

## 2012-01-18 NOTE — Progress Notes (Signed)
Overall doing fairly well today. Sitting up in a chair looking comfortable. Still has some numbness paresthesias and weakness in her left upper trimming but these are improved from her preoperative state. Neck pain is well controlled. No other new problems.  On exam she is awake and alert she is oriented and recently appropriate. Motor examination reveals 5 over 5 strength to right upper extremity. Left upper extremity with 3 of her 5 weakness of her grips and intrinsics. Wound clean dry and intact.  Progressing reasonably well status post 4 level anterior cervical decompression and fusion. From a neurosurgical standpoint patient may be discharged from the hospital to either a skilled nursing facility or home with family assistance. Patient will need to follow up with Dr. Yetta Barre approximately 1-2 weeks after discharge.

## 2012-01-18 NOTE — Progress Notes (Signed)
FMTS Attending NOte  Patient seen and examined by me, she reports feeling better this morning than yesterday.  Is eager to get to her home; asks for time to consider whether she's amenable to rehab prior to going home.   I agree with assessment and plan as per Dr. Whitney Muse note above.  Paula Compton, MD

## 2012-01-19 LAB — GLUCOSE, CAPILLARY
Glucose-Capillary: 92 mg/dL (ref 70–99)
Glucose-Capillary: 120 mg/dL — ABNORMAL HIGH (ref 70–99)
Glucose-Capillary: 97 mg/dL (ref 70–99)
Glucose-Capillary: 125 mg/dL — ABNORMAL HIGH (ref 70–99)

## 2012-01-19 NOTE — Progress Notes (Signed)
Patient ID: [name] [name], female   DOB: 07/03/1951, 61 y.o.   MRN: [id number] Wound  Dry. Ambulating. Wants to go to a snf

## 2012-01-19 NOTE — Progress Notes (Signed)
Subjective: Complains of minimal left hand pain. Is comfortable.  Waiting for Rehab placement.   Objective: Vital signs in last 24 hours: Temp:  [97.5 F (36.4 C)-98.1 F (36.7 C)] 97.5 F (36.4 C) (05/19 0221) Pulse Rate:  [68-75] 68  (05/19 0221) Resp:  [19-20] 19  (05/19 0221) BP: (110-161)/(68-79) 110/68 mmHg (05/19 0221) SpO2:  [94 %-99 %] 99 % (05/19 0904) Weight change:  Last BM Date: 01/18/12  Intake/Output from previous day:   Intake/Output this shift:   Physical Exam: General appearance: alert and oriented, comfortable sitting in the chair.  Resp: clear to auscultation bilaterally Cardio: S1S2, rrr, 2/6 systolic murmur GI: soft, non-tender; bowel sounds normal; no masses,  no organomegaly Extremities: extremities normal, atraumatic, no cyanosis or edema Neurologic: CN2-12 grossly intact, decreased strength in left hand and arm.   Lab Results:  [facility] 01/17/12 0640  WBC 11.7*  HGB 10.2*  HCT 32.6*  PLT 175   BMET  Basename 01/18/12 0605 01/17/12 0640  NA 141 135  K 4.1 4.1  CL 100 97  CO2 31 29  GLUCOSE 190* 152*  BUN 15 13  CREATININE 0.53 0.67  CALCIUM 10.6* 9.6    Studies/Results: No results found.  Medications:     . acyclovir  400 mg Oral BID  . allopurinol  300 mg Oral Daily  . azelastine  2 spray Each Nare Daily  . brimonidine  1 drop Left Eye BID  . celecoxib  200 mg Oral BID  . fluticasone  2 puff Inhalation BID  . furosemide  20 mg Oral Daily  . gabapentin  300 mg Oral TID  . hydrochlorothiazide  12.5 mg Oral Daily  . insulin aspart  0-9 Units Subcutaneous TID WC  . insulin glargine  20 Units Subcutaneous QHS  . latanoprost  1 drop Both Eyes QHS  . losartan  50 mg Oral Daily  . metFORMIN  1,000 mg Oral BID AC  . metoprolol succinate  50 mg Oral BID  . montelukast  10 mg Oral QHS  . pantoprazole  40 mg Oral Daily  . polyethylene glycol  17 g Oral Daily  . potassium chloride  20 mEq Oral Daily  . pregabalin  100 mg Oral  Daily  . pregabalin  200 mg Oral QHS  . sertraline  150 mg Oral QHS  . simvastatin  40 mg Oral q morning - 10a  . sodium chloride  3 mL Intravenous Q12H  . sodium chloride  3 mL Intravenous Q12H  . sodium chloride  3 mL Intravenous Q12H  . Vitamin D (Ergocalciferol)  50,000 Units Oral Q7 days    Assessment/Plan: 61 yo female with fall 1 month ago who has been seen at clinic for post concussive syndrome and had witnessed syncopal event.  # Left sided cord compression at C3-C4:  Post op day 4 s/p: Decompressive anterior cervical discectomy C3-4 C4-5 C5-6 and C6-7, 2. Anterior cervical arthrodesis C3-4 C4-5 C5-6 and C6-7 utilizing cortico-cancellus allografts, 3. Anterior cervical plating C3-C7 inclusive utilizing a Synthes translational plate - pain in arm suspected to be from radiculitis. Currently on celecoxib and dexamethasone day 3.  - continue PT/OT - pain control: not requiring dilaudid. Continue morphine and percocet as needed  # Syncope: unclear etiology although this could have been secondary to hypoglycemia.  - no new episodes since admission.  - no recent hypoglycemia  # DM- back on metformin and lantus 20 qhs since surgery. CBG's: 150-183 - continue current regimen  CBG (  last 3)   Basename 01/19/12 0641 01/18/12 2024 01/18/12 1612  GLUCAP 92 122* 109*   # HTN- stable s/p surgery. Continue home regimen: lasix 20 qd, hctz 12.5 qd, losartan 50 and metoprolol 50 bid # HLD- Continue simvastatin 40  #Depression- Continue Zoloft. 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 No evidence of lesions, per patient.  # GERD- Continue PPI  # PPX-  Heparin SQ for DVT Prophylaxis. # Dispo- PT/OT recommends home health PT/OT. Patient does not have 24hr caretaker at home and per her PCP, Dr. Jennette Kettle, may benefit from short term rehab.  We are awaiting rehab placement.    LOS: 11 days   Manson Luckadoo MD 01/19/2012, 9:10 AM

## 2012-01-19 NOTE — Progress Notes (Signed)
FMTS Attending Note  Patient seen and examined by me, reports less neck pain than previously.  Is amenable to considering rehab upon discharge.  Will discuss with CSW tomorrow .  Paula Compton, MD

## 2012-01-20 LAB — GLUCOSE, CAPILLARY
Glucose-Capillary: 90 mg/dL (ref 70–99)
Glucose-Capillary: 100 mg/dL — ABNORMAL HIGH (ref 70–99)
Glucose-Capillary: 110 mg/dL — ABNORMAL HIGH (ref 70–99)

## 2012-01-20 NOTE — Progress Notes (Addendum)
Clinical Social Worker (CSW) was informed this am by MD that pt is desiring SNF placement at dc.This CSW assessed pt 01/14/12 and at that time pt stated she was not agreeable to SNF placement and would not give CSW consent to fax pt information to SNF. After pt surgery CSW observed that PT/OT recommended HHPT with supervision however pt now agreeable to SNF placement. CSW spoke to MD and pt and informed them that pt [facility] insurance may not pay for placement if PT/OT recommending HH. CSW will inform PT/OT that pt does not have supervision at home and pt feels unstable to return home without having received skilled rehab where 24hr care is provided . CSW will fax pt out to facilities in [facility] and follow up with pt to determine whether insurance has approved SNF placement. CSW has informed RNCM.  Theresia Bough, MSW, Theresia Majors [phone]

## 2012-01-20 NOTE — Progress Notes (Signed)
Family Medicine Teaching Service Attending Note  I interviewed and examined patient [name] [name] and reviewed their tests and x-rays.  I discussed with Dr. Gwenlyn Saran and reviewed their note for today.  I agree with their assessment and plan.     Additionally  Await physical therapy and OT evals.  Would benefit from rehab to gain strength and lessen risks of falls and return

## 2012-01-20 NOTE — Progress Notes (Signed)
Physical Therapy Treatment Patient Details Name: [name] [name] MRN: [id number] DOB: May 30, 1951 Today's Date: 01/20/2012 Time: 4098-1191 PT Time Calculation (min): 25 min  PT Assessment / Plan / Recommendation Comments on Treatment Session  Pt moving well however expresses concern of d/c home due to ADLs.  Pt continues to need minguard (A) for safety and would benefit from further therapy due to overall endurance prior to d/c home.      Follow Up Recommendations  Skilled nursing facility    Barriers to Discharge        Equipment Recommendations  3 in 1 bedside comode    Recommendations for Other Services    Frequency Min 5X/week   Plan Discharge plan needs to be updated;Frequency needs to be updated    Precautions / Restrictions Precautions Precautions: Fall Required Braces or Orthoses: Cervical Brace Cervical Brace: Soft collar Restrictions Weight Bearing Restrictions: No   Pertinent Vitals/Pain 4/10 neck    Mobility  Bed Mobility Supine to Sit: 5: Supervision Sit to Sidelying Right: 4: Min guard;With rail (min A to get situated/comfortable in bed) Details for Bed Mobility Assistance: Supervision for safety and needed extra time to complete task. Transfers Transfers: Sit to Stand;Stand to Sit Sit to Stand: 4: Min guard;With upper extremity assist;From chair/3-in-1 Stand to Sit: 4: Min guard Details for Transfer Assistance: Minguard for safety with cues for hand placement  Ambulation/Gait Ambulation/Gait Assistance: 4: Min guard Ambulation Distance (Feet): 125 Feet Assistive device: Rolling walker Ambulation/Gait Assistance Details: Minguard for safety with cues for proper body position within RW and upright posture.   Gait Pattern: Step-through pattern;Decreased stride length    Exercises General Exercises - Lower Extremity Mini-Sqauts: Strengthening;Both;5 reps;Seated   PT Diagnosis:    PT Problem List:   PT Treatment Interventions:     PT Goals Acute Rehab  PT Goals PT Goal Formulation: With patient Time For Goal Achievement: 01/23/12 Potential to Achieve Goals: Good Pt will go Supine/Side to Sit: Independently PT Goal: Supine/Side to Sit - Progress: Progressing toward goal Pt will go Sit to Stand: with modified independence PT Goal: Sit to Stand - Progress: Progressing toward goal Pt will go Stand to Sit: with modified independence PT Goal: Stand to Sit - Progress: Progressing toward goal Pt will Ambulate: 51 - 150 feet;with modified independence;with rolling walker PT Goal: Ambulate - Progress: Progressing toward goal  Visit Information  Last PT Received On: 01/20/12 Assistance Needed: +1    Subjective Data      Cognition  Overall Cognitive Status: Appears within functional limits for tasks assessed/performed Arousal/Alertness: Awake/alert Orientation Level: Appears intact for tasks assessed Behavior During Session: [facility] for tasks performed Cognition - Other Comments: Slow to process    Balance  Balance Balance Assessed: Yes Static Sitting Balance Static Sitting - Balance Support: Feet supported Static Sitting - Level of Assistance: 7: Independent  End of Session PT - End of Session Equipment Utilized During Treatment: Gait belt;Cervical collar Activity Tolerance: Patient tolerated treatment well Patient left: in chair;with call bell/phone within reach Nurse Communication: Mobility status    [name] [name] 01/20/2012, 3:27 PM Jake Shark, PT DPT [phone]

## 2012-01-20 NOTE — Progress Notes (Signed)
Occupational Therapy Treatment Patient Details Name: [name] [name] MRN: [id number] DOB: [birth year] Today's Date: 01/20/2012 Time: 7829-5621 OT Time Calculation (min): 34 min  OT Assessment / Plan / Recommendation Comments on Treatment Session motivated; interested in AE.  Will need reinforcement with this.  Had some difficulty seeing leg/reacher/pants--may be positioning vs. vision.  Pt wears thick glasses. Pt is able to lift LUE to 90 degrees without pain    Follow Up Recommendations  Skilled nursing facility    Barriers to Discharge       Equipment Recommendations  3 in 1 bedside comode once she leave [facility]    Recommendations for Other Services    Frequency Min 2X/week   Plan Discharge plan needs to be updated   Precautions / Restrictions Precautions Precautions: Fall Required Braces or Orthoses: Cervical Brace Cervical Brace: Soft collar Restrictions Weight Bearing Restrictions: No   Pertinent Vitals/Pain Neck 2/10    ADL  Lower Body Dressing: Performed;Minimal assistance;Moderate assistance (min pants with reacher; mod socks with sock aid) Where Assessed - Lower Body Dressing: Sopported sit to stand Toilet Transfer: Simulated;Min guard (chair to bed) Transfers/Ambulation Related to ADLs: ambulated 4 feet to bed with min guard A; assist to move lines/cords out of way ADL Comments: Educated on AE:  sock aid and reacher.  Pt unable to cross LLE over R thigh--states it hurts her hip.  Can do R but states she feels tension in her neck.  Pt needs additional practice with AE.  Has some visual difficulty:  has old glasses here and they are stratched.  Pt very fatiqued at end fo session and requested back to bed.  Also, noted LUE can only make about 1/2 fist:  educated to open and close fingers as often as she can to build strength.  She does have an adequate pinch to don sock on sock aid and hold built up handle to pull sock over foot.  Pt had just used bathroom and stood to brush  teeth with nursing before I arrived.    OT Diagnosis:    OT Problem List:   OT Treatment Interventions:     OT Goals Acute Rehab OT Goals Time For Goal Achievement: 01/23/12 ADL Goals Pt Will Perform Lower Body Dressing: with modified independence;Sit to stand from chair;Sit to stand from bed;Other (comment) ADL Goal: Lower Body Dressing - Progress: Progressing toward goals Pt Will Transfer to Toilet: with modified independence;3-in-1 ADL Goal: Toilet Transfer - Progress: Progressing toward goals Arm Goals Additional Arm Goal #1: pt will verbalize HEP of 10x making a fist and releasing 6-8 times a day Arm Goal: Additional Goal #1 - Progress: Goal set today  Visit Information  Last OT Received On: 01/20/12 Assistance Needed: +1    Subjective Data  Subjective: I'm having trouble with this hand (L) and sometimes my arm hurts all the way down.   Prior Functioning       Cognition  Overall Cognitive Status: Appears within functional limits for tasks assessed/performed Arousal/Alertness: Awake/alert Orientation Level: Appears intact for tasks assessed Behavior During Session: [facility] for tasks performed    Mobility Bed Mobility Sit to Sidelying Right: 4: Min guard;With rail (min A to get situated/comfortable in bed) Transfers Transfers: Yes Sit to Stand: 4: Min guard;With upper extremity assist;From chair/3-in-1   Exercises    Balance    End of Session OT - End of Session Activity Tolerance: Patient limited by fatigue Patient left: in bed;with call bell/phone within reach   [facility] 01/20/2012, 1:33  PM [place], OTR/L [phone] 01/20/2012

## 2012-01-20 NOTE — Progress Notes (Signed)
Subjective: Neck pain and shoulder pain improved although still present. Weakness improving. Would like to go to short term rehab.  Objective: Vital signs in last 24 hours: Temp:  [97.3 F (36.3 C)-98.1 F (36.7 C)] 97.3 F (36.3 C) (05/20 0600) Pulse Rate:  [59-79] 76  (05/20 0600) Resp:  [18-19] 18  (05/20 0600) BP: (96-119)/(59-73) 97/59 mmHg (05/20 0600) SpO2:  [94 %-99 %] 98 % (05/20 0841) Weight change:  Last BM Date: 01/20/12  Intake/Output from previous day: 05/19 0701 - 05/20 0700 In: -  Out: 300 [Urine:300] Intake/Output this shift:   Physical Exam: General appearance: alert and oriented, comfortable sitting in the chair.  Resp: clear to auscultation bilaterally Cardio: S1S2, rrr, 2/6 systolic murmur GI: soft, non-tender; bowel sounds normal; no masses,  no organomegaly Extremities: extremities normal, atraumatic, no cyanosis or edema Neurologic: CN2-12 grossly intact, decreased strength in left hand and arm but improved from previous.   Lab Results: No results found for this basename: WBC:2,HGB:2,HCT:2,PLT:2 in the last 72 hours BMET  [facility] 01/18/12 0605  NA 141  K 4.1  CL 100  CO2 31  GLUCOSE 190*  BUN 15  CREATININE 0.53  CALCIUM 10.6*    Studies/Results: No results found.  Medications:     . acyclovir  400 mg Oral BID  . allopurinol  300 mg Oral Daily  . azelastine  2 spray Each Nare Daily  . brimonidine  1 drop Left Eye BID  . celecoxib  200 mg Oral BID  . fluticasone  2 puff Inhalation BID  . furosemide  20 mg Oral Daily  . gabapentin  300 mg Oral TID  . hydrochlorothiazide  12.5 mg Oral Daily  . insulin aspart  0-9 Units Subcutaneous TID WC  . insulin glargine  20 Units Subcutaneous QHS  . latanoprost  1 drop Both Eyes QHS  . losartan  50 mg Oral Daily  . metFORMIN  1,000 mg Oral BID AC  . metoprolol succinate  50 mg Oral BID  . montelukast  10 mg Oral QHS  . pantoprazole  40 mg Oral Daily  . polyethylene glycol  17 g Oral Daily    . potassium chloride  20 mEq Oral Daily  . pregabalin  100 mg Oral Daily  . pregabalin  200 mg Oral QHS  . sertraline  150 mg Oral QHS  . simvastatin  40 mg Oral q morning - 10a  . sodium chloride  3 mL Intravenous Q12H  . sodium chloride  3 mL Intravenous Q12H  . sodium chloride  3 mL Intravenous Q12H  . Vitamin D (Ergocalciferol)  50,000 Units Oral Q7 days    Assessment/Plan: 61 yo female with fall 1 month ago who has been seen at clinic for post concussive syndrome and had witnessed syncopal event.  # Left sided cord compression at C3-C4:  Post op day 5 s/p: Decompressive anterior cervical discectomy C3-4 C4-5 C5-6 and C6-7, 2. Anterior cervical arthrodesis C3-4 C4-5 C5-6 and C6-7 utilizing cortico-cancellus allografts, 3. Anterior cervical plating C3-C7 inclusive utilizing a [employer] translational plate - pain in arm suspected to be from radiculitis, much improved requiring percocet and tramadol - continue PT/OT - f/u with neurosurgery in 2 weeks  # Syncope: unclear etiology although this could have been secondary to hypoglycemia.  - no new episodes since admission.  - no recent hypoglycemia  # DM- back on metformin and lantus 20 qhs since surgery. CBG's: 90-125 - continue current regimen  CBG (last 3)  Basename 01/20/12 0707 01/19/12 2158 01/19/12 1621  GLUCAP 90 125* 97   # HTN- stable s/p surgery. Continue home regimen: lasix 20 qd, hctz 12.5 qd, losartan 50 and metoprolol 50 bid # HLD- Continue simvastatin 40  #Depression- Continue Zoloft. 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 No evidence of lesions, per patient.  # GERD- Continue PPI  # PPX-  Heparin SQ for DVT Prophylaxis. 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 LOS: 12 days   Beautiful Pensyl 01/20/2012, 12:25 PM

## 2012-01-20 NOTE — Progress Notes (Signed)
Clinical Child psychotherapist (CSW) received a bed offer from pt first choice SNF placement, [facility]. CSW awaiting updated PT/OT notes to submit to [facility] for authorization. Once/IF pt insurance provides authorization for placement and transportation then pt able to dc to [place].   Theresia Bough, MSW, Theresia Majors [phone]

## 2012-01-20 NOTE — Progress Notes (Signed)
Clinical Child psychotherapist (CSW) submitted pt clinicals to [employer] and contacted representative to provide authorization for pt. Authorization may not be received until tomorrow- CSW to follow.  Theresia Bough, MSW, Theresia Majors [phone]

## 2012-01-20 NOTE — Progress Notes (Signed)
Patient ID: [name], female   DOB: March 21, 1951, 61 y.o.   MRN: [id number] Patient looks better today. She looks much more comfortable. She is out of bed to a chair. Her strength is unchanged. She has some weakness of her left grip and some weakness of her left tricep. She states she is ambulating fairly well. Her incision looks good. She swallowing well. No pain in the left arm is better. It is much more intermittent and less severe to the point that she has very little pain now. Overall she is doing better and seems to be able for discharge to rehabilitation.

## 2012-01-20 NOTE — Clinical Social Work Placement (Signed)
     Clinical Social Work Department CLINICAL SOCIAL WORK PLACEMENT NOTE 01/20/2012  Patient:  [name] [name], [name] [name]  Account Number:  [id number] Admit date:  01/08/2012  Clinical Social Worker:  Theresia Bough, Theresia Majors  Date/time:  01/20/2012 01:57 PM  Clinical Social Work is seeking post-discharge placement for this patient at the following level of care:   SKILLED NURSING   (*CSW will update this form in Epic as items are completed)     Patient/family provided with [facility] Department of Clinical Social Works list of facilities offering this level of care within the geographic area requested by the patient (or if unable, by the patients family).    Patient/family informed of their freedom to choose among providers that offer the needed level of care, that participate in Medicare, Medicaid or managed care program needed by the patient, have an available bed and are willing to accept the patient.    Patient/family informed of [facility] ownership interest in [facility], as well as of the fact that they are under no obligation to receive care at this facility.  PASARR submitted to [employer] on 01/14/2012 PASARR number received from [employer] on 01/14/2012  FL2 transmitted to all facilities in geographic area requested by pt/family on  01/20/2012 FL2 transmitted to all facilities within larger geographic area on   Patient informed that his/her managed care company has contracts with or will negotiate with  certain facilities, including the following:     Patient/family informed of bed offers received:  01/20/2012 Patient chooses bed at [facility] Physician recommends and patient chooses bed at    Patient to be transferred to  on   Patient to be transferred to facility by   The following physician request were entered in Epic:   Additional Comments:

## 2012-01-21 MED ORDER — INSULIN GLARGINE 100 UNIT/ML ~~LOC~~ SOLN: 20.0000 [IU] | Freq: Every day | SUBCUTANEOUS | Status: DC

## 2012-01-21 MED ORDER — LOSARTAN POTASSIUM 50 MG PO TABS: 50.0000 mg | ORAL_TABLET | Freq: Every day | ORAL | Status: DC

## 2012-01-21 MED ORDER — TRAMADOL HCL 50 MG PO TABS: 50.0000 mg | ORAL_TABLET | Freq: Three times a day (TID) | ORAL | Status: DC | PRN

## 2012-01-21 MED ORDER — ACETAMINOPHEN 325 MG PO TABS: 650.0000 mg | ORAL_TABLET | ORAL | Status: DC | PRN

## 2012-01-21 MED ORDER — MENTHOL 3 MG MT LOZG: 1.0000 | LOZENGE | OROMUCOSAL | Status: DC | PRN

## 2012-01-21 MED ORDER — VITAMIN D (ERGOCALCIFEROL) 1.25 MG (50000 UNIT) PO CAPS: 50000.0000 [IU] | ORAL_CAPSULE | ORAL | Status: DC

## 2012-01-21 MED ORDER — CELECOXIB 200 MG PO CAPS: 200.0000 mg | ORAL_CAPSULE | Freq: Two times a day (BID) | ORAL | Status: DC

## 2012-01-21 MED ORDER — HYDROCHLOROTHIAZIDE 12.5 MG PO CAPS: 12.5000 mg | ORAL_CAPSULE | Freq: Every day | ORAL | Status: DC

## 2012-01-21 NOTE — Progress Notes (Signed)
Physical Therapy Treatment Patient Details Name: [name] [name] MRN: [id number] DOB: 1950-12-01 Today's Date: 01/21/2012 Time: 0820-0839 PT Time Calculation (min): 19 min  PT Assessment / Plan / Recommendation Comments on Treatment Session  Patient continues to be highly motivated. Patient to DC to [facility] today per LCSW as she does not have assistance with ADLs and lacks overall endurance for safe DC home    Follow Up Recommendations  Skilled nursing facility    Barriers to Discharge        Equipment Recommendations  Defer to next venue    Recommendations for Other Services    Frequency Min 5X/week   Plan      Precautions / Restrictions Precautions Precautions: Cervical Precaution Comments: reviewed cervical precautions with patient Required Braces or Orthoses: Cervical Brace Cervical Brace: Soft collar   Pertinent Vitals/Pain Denied pain     Mobility  Bed Mobility Bed Mobility: Not assessed Transfers Sit to Stand: 4: Min guard;With upper extremity assist;With armrests;From chair/3-in-1 Stand to Sit: 4: Min guard;With armrests;With upper extremity assist;To chair/3-in-1 Details for Transfer Assistance: Cues for safe hand placement. Required increased effort Ambulation/Gait Ambulation/Gait Assistance: 4: Min guard Ambulation Distance (Feet): 250 Feet Assistive device: Rolling walker Ambulation/Gait Assistance Details: Patient required cues for management around obstacles and RW management Gait Pattern: Step-through pattern;Decreased stride length Gait velocity: 0.94 ft/sec Stairs: No    Exercises     PT Diagnosis:    PT Problem List:   PT Treatment Interventions:     PT Goals Acute Rehab PT Goals PT Goal: Sit to Stand - Progress: Progressing toward goal PT Goal: Stand to Sit - Progress: Progressing toward goal PT Goal: Ambulate - Progress: Progressing toward goal  Visit Information  Last PT Received On: 01/21/12 Assistance Needed: +1    Subjective  Data  Subjective: I am ready to walk   Cognition  Overall Cognitive Status: Appears within functional limits for tasks assessed/performed Arousal/Alertness: Awake/alert Orientation Level: Appears intact for tasks assessed Behavior During Session: [facility] for tasks performed    Balance     End of Session PT - End of Session Equipment Utilized During Treatment: Gait belt Activity Tolerance: Patient tolerated treatment well Patient left: in chair;with call bell/phone within reach Nurse Communication: Mobility status    Fredrich Birks 01/21/2012, 8:59 AM 01/21/2012 Fredrich Birks PTA [phone] pager [phone] office

## 2012-01-21 NOTE — Progress Notes (Signed)
Clinical Child psychotherapist (CSW) was provided pt [employer] authorization for pt transport to [place] ([phone]). CSW confirmed with facility that pt okay to transfer today when dc paperwork completed. CSW informed pt and CSW to facilitate with dc to [place] today.  Theresia Bough, MSW, Theresia Majors [phone]

## 2012-01-21 NOTE — Progress Notes (Signed)
Clinical Child psychotherapist (CSW) informed that dc summary completed for pt. CSW submitted AVS and dc summary to [facility] via [facility] and informed of facility of transport. CSW notified RN and provided RN with [facility] contact number [phone]) to call for RN report. CSW has prepared and placed dc packet in pt wall-a-roo & contacted [facility] for a transport to [place] today. No additional CSW needs-CSW signing off.  Theresia Bough, MSW, Theresia Majors [phone]

## 2012-01-21 NOTE — Discharge Summary (Signed)
I have reviewed this discharge summary and agree.    

## 2012-01-22 ENCOUNTER — Encounter (HOSPITAL_COMMUNITY): Payer: Self-pay

## 2012-01-22 ENCOUNTER — Other Ambulatory Visit: Payer: Self-pay | Admitting: Family Medicine

## 2012-01-22 LAB — GLUCOSE, CAPILLARY
Glucose-Capillary: 88 mg/dL (ref 70–99)
Glucose-Capillary: 102 mg/dL — ABNORMAL HIGH (ref 70–99)
Glucose-Capillary: 130 mg/dL — ABNORMAL HIGH (ref 70–99)

## 2012-01-22 MED ORDER — PREGABALIN 100 MG PO CAPS: ORAL_CAPSULE | ORAL | Status: DC

## 2012-01-22 NOTE — Telephone Encounter (Signed)
Written and attached to fax to send to [facility]  

## 2012-01-23 ENCOUNTER — Non-Acute Institutional Stay: Payer: Self-pay | Admitting: Family Medicine

## 2012-01-23 MED ORDER — METOPROLOL TARTRATE 50 MG PO TABS: 50.0000 mg | ORAL_TABLET | Freq: Two times a day (BID) | ORAL | Status: DC

## 2012-01-23 MED ORDER — INSULIN GLARGINE 100 UNIT/ML ~~LOC~~ SOLN: 40.0000 [IU] | Freq: Every day | SUBCUTANEOUS | Status: DC

## 2012-01-23 NOTE — Progress Notes (Signed)
[facility] Admission History and Physical  Patient name: [name] [name] Medical record number: [id number] Date of birth: 06-27-1951 Age: 60 y.o. Gender: female  Primary Care Provider: Denny Levy, MD, MD  Chief Complaint: Rehabilitation History of Present Illness: [name] [name] is a 61 y.o. year old female presenting with   1. Cervical spondylosis with cervical spinal stenosis at C3-4 C4-5 C5-6 and C6-7, with cervical spondylitic myelopathy - resolved:   Currently experiencing left arm weakness which is improving status post multilevel fusion.  No significant numbness or tingling. Walking is not significantly impaired. No bowel or bladder dysfunction.    2. Diabetes: Currently doing well on Lantus  3. Hypertension currently well on medications listed below. No chest pains or palpitations 4. Hyperlipidemia: See medications listed below. Asymptomatic 5. Depression on Zoloft without any suicidality or homicidality. Feels well 6. GERD on proton pump inhibitor. Symptoms well controlled 7. Low vitamin D. On vitamin D currently  Patient Active Problem List  Diagnoses  . HERPES SIMPLEX INFECTION, RECURRENT  . DIABETES MELLITUS II, UNCOMPLICATED  . HYPERLIPIDEMIA-MIXED  . DEPRESSION, MAJOR, RECURRENT  . OBSTRUCTIVE SLEEP APNEA  . CARPAL TUNNEL SYNDROME, BILATERAL  . PERIPHERAL NEUROPATHY  . GLAUCOMA  . NEURAL HEARING LOSS BILATERAL  . HYPERTENSION, BENIGN SYSTEMIC  . PARALYSIS, VOCAL CORD, UNILATERAL, TOTAL  . GERD  . IRRITABLE BOWEL SYNDROME  . GAIT IMBALANCE  . CATARACT EXTRACTION STATUS  . DEGENERATIVE JOINT DISEASE, KNEE  . Thoracolumbar back pain  . Allergic rhinitis due to pollen  . Hip pain, bilateral  . Knee pain, bilateral  . Hip arthritis  . Nail dystrophy  . Syncope   Past Medical History: Past Medical History  Diagnosis Date  . Chronic cough     Now followed by pulmonary  . Sleep apnea     on CPAP 12  . HTN (hypertension)   . Dyslipidemia   .  Gout   . History of colonoscopy   . Concussion 11/18/11    "fell at dr's office; hit head"  . Complication of anesthesia     "just wake up coughing; that's all"  . Heart murmur   . Asthma   . COPD (chronic obstructive pulmonary disease)   . Diabetes mellitus type II     "take insulin & pills"  . Blood transfusion   . GERD (gastroesophageal reflux disease)   . Arthritis   . Chronic lower back pain   . Vocal cord paralysis, unilateral partial     "right"  . Depression   . Peripheral neuropathy   . Angina   . Pneumonia     "i've had it once" (11/18/11)  . Exertional dyspnea   . Headache 11/18/11    "I've had mild headaches the last couple days"  . Headache 01/08/12    "pretty constant since 11/18/11's concussion"    Past Surgical History: Past Surgical History  Procedure Date  . Colonoscopy w/ biopsies 11/21/2010    adenoma polyp--no hi grade dysplasia Dr [address]  . Treatment fistula anal   . Breast reduction surgery 04-21-2003  . Tubal ligation 1987  . Cardiac catheterization 03-02-2004  . Bronchoscopy 07-2007  . Knee arthroscopy 08/2000    right  . Shoulder arthroscopy 08/2000    right  . T-score 09/02/04    -0.54 (low risk currently)  . Total abdominal hysterectomy 10-07-2000  . Cesarean section 1987  . Shoulder arthroscopy w/ rotator cuff repair 10/18/2003    left  . Cataract extraction 1955;  1979    bilateral; right eye  . Eye surgery   . Breast biopsy     bilaterally  . Breast cyst excision     right twice; left once  . Carpal tunnel release     left  . Anterior cervical decomp/discectomy fusion 01/15/2012    Procedure: ANTERIOR CERVICAL DECOMPRESSION/DISCECTOMY FUSION 3 LEVELS;  Surgeon: Tia Alert, MD;  Location: [facility];  Service: Neurosurgery;  Laterality: N/A;  Anterior Cervical Decompression Discectomy Fusion Cerivcal three four, cervical four five, cervical five six, cervical six seven    Social History: History   Social History  . Marital Status:  Single    Spouse Name: N/A    Number of Children: 1  . Years of Education: N/A   Occupational History  . unemployed    Social History Main Topics  . Smoking status: Former Smoker -- 0.5 packs/day for .5 years    Types: Cigarettes    Quit date: 09/03/1975  . Smokeless tobacco: Never Used  . Alcohol Use: No  . Drug Use: No  . Sexually Active: Not Currently   Other Topics Concern  . Not on file   Social History Narrative   Lives alone.Approved for section 8 housing (12/03).Has a son who may be autistic and stays with her at times.    Family History: Family History  Problem Relation Age of Onset  . Diabetes Mother   . Colon cancer Neg Hx     Allergies: Allergies  Allergen Reactions  . Amlodipine Besylate Swelling  . Lisinopril-Hydrochlorothiazide Cough    REACTION: cough-- changed to arb  . Propoxyphene Hcl Itching  . Valsartan Other (See Comments)    REACTION:  "sleep more the next day after I took it; it made me real tired"  . Metronidazole     REACTION: "just didn't work; my body never did heal from it"    Current Outpatient Prescriptions  Medication Sig Dispense Refill  . acetaminophen (TYLENOL) 325 MG tablet Take 2 tablets (650 mg total) by mouth every 4 (four) hours as needed for pain.  90 tablet  6  . acyclovir (ZOVIRAX) 400 MG tablet Take 400 mg by mouth 2 (two) times daily.      [name] albuterol (PROVENTIL HFA;VENTOLIN HFA) 108 (90 BASE) MCG/ACT inhaler Inhale 2 puffs into the lungs every 4 (four) hours as needed. For shortness of breath      . allopurinol (ZYLOPRIM) 300 MG tablet Take 300 mg by mouth daily.      [name] arformoterol (BROVANA) 15 MCG/2ML NEBU Take 15 mcg by nebulization 2 (two) times daily as needed. For shortness of breath      . aspirin EC 81 MG tablet Take 325 mg by mouth daily. 325=4 tablets      . brimonidine (ALPHAGAN P) 0.1 % SOLN Place 1 drop into the left eye 2 (two) times daily.        [name] docusate sodium (COLACE) 100 MG capsule Take 100 mg by  mouth 2 (two) times daily as needed. For constipation      . esomeprazole (NEXIUM) 40 MG capsule Take 40 mg by mouth 2 (two) times daily.      . hydrochlorothiazide (MICROZIDE) 12.5 MG capsule Take 1 capsule (12.5 mg total) by mouth daily.  30 capsule  4  . insulin glargine (LANTUS) 100 UNIT/ML injection Inject 40 Units into the skin at bedtime.  10 mL  5  . latanoprost (XALATAN) 0.005 % ophthalmic solution Place 1 drop  into both eyes at bedtime.      [name] losartan (COZAAR) 50 MG tablet Take 1 tablet (50 mg total) by mouth daily.  30 tablet  4  . menthol-cetylpyridinium (CEPACOL) 3 MG lozenge Take 1 lozenge (3 mg total) by mouth as needed (sore throat).  100 tablet  0  . metFORMIN (GLUCOPHAGE) 1000 MG tablet Take 1,000 mg by mouth 2 (two) times daily. Take 1.5 tablet in the morning and 1 tablet in the evening      . metoprolol (LOPRESSOR) 50 MG tablet Take 1 tablet (50 mg total) by mouth 2 (two) times daily.  1 tablet  0  . montelukast (SINGULAIR) 10 MG tablet Take 10 mg by mouth daily.      . nitroGLYCERIN (NITROSTAT) 0.4 MG SL tablet Place 0.4 mg under the tongue every 5 (five) minutes as needed.      . polyethylene glycol (MIRALAX / GLYCOLAX) packet Take 17 g by mouth daily as needed. For constipation      . pregabalin (LYRICA) 100 MG capsule 1 tablet in the AM and 2 tablets at bedtime.  90 capsule  11  . sertraline (ZOLOFT) 100 MG tablet Take 150 mg by mouth at bedtime.      . simvastatin (ZOCOR) 40 MG tablet Take 40 mg by mouth daily.      . traMADol (ULTRAM) 50 MG tablet Take 1-2 tablets (50-100 mg total) by mouth every 8 (eight) hours as needed. For joint pain.  30 tablet  2  . Vitamin D, Ergocalciferol, (DRISDOL) 50000 UNITS CAPS Take 1 capsule (50,000 Units total) by mouth every 7 (seven) days.  30 capsule  0   Review Of Systems: Per HPI  .  Physical Exam: Exam:  Gen: Well NAD HEENT: EOMI,  MMM, very short sighted with nystagmus of the left eye. Lungs: CTABL Nl WOB Heart: RRR no  MRG Abd: NABS, NT, ND Exts: Non edematous BL  LE, warm and well perfused. Neuro: Left upper extremity is weak with 4/5 strength of elbow extension and 4/5 strength of the finger abduction, and wrist extension. Sensation is normal bilaterally Lotrimin he strength is normal bilaterally.     Labs and Imaging: Lab Results  Component Value Date/Time   NA 141 01/18/2012  6:05 AM   K 4.1 01/18/2012  6:05 AM   CL 100 01/18/2012  6:05 AM   CO2 31 01/18/2012  6:05 AM   BUN 15 01/18/2012  6:05 AM   CREATININE 0.53 01/18/2012  6:05 AM   CREATININE 0.64 07/03/2011 11:06 AM   GLUCOSE 190* 01/18/2012  6:05 AM   Lab Results  Component Value Date   WBC 11.7* 01/17/2012   HGB 10.2* 01/17/2012   HCT 32.6* 01/17/2012   MCV 88.8 01/17/2012   PLT 175 01/17/2012     Assessment and Plan: [name] [name] is a 61 y.o. year old female presenting with  1. Cervical spondylosis with cervical spinal stenosis at C3-4 C4-5 C5-6 and C6-7, with cervical spondylitic myelopathy - resolved:  Plan to continue physical therapy and rehabilitation. We'll discontinue Neurontin as is already on Lyrica. 2. Diabetes: Will simplify Lantus 2 daily 40 units each bedtime 3. Hypertension: Will simplify regimen and stop Lasix 4. Hyperlipidemia: On Zocor if here longer than 2 months we'll consider checking direct LDL 5. Depression currently doing well. No plans to change 6. Herpes genitales continue acyclovir as needed 7. GERD 8. Low vitamin D stop date for vitamin D. in 8 weeks

## 2012-01-30 ENCOUNTER — Ambulatory Visit: Payer: Medicare Other | Admitting: Internal Medicine

## 2012-01-31 ENCOUNTER — Non-Acute Institutional Stay: Payer: Self-pay | Admitting: Family Medicine

## 2012-01-31 NOTE — Progress Notes (Signed)
Pt discharged from [facility] on 01/30/12

## 2012-02-03 NOTE — Discharge Instructions (Signed)
Driving and Equipment Restrictions Some medical problems make it dangerous to drive, ride a bike, or use machines. Some of these problems are:  A hard blow to the head (concussion).   Passing out (fainting).   Twitching and shaking (seizures).   Low blood sugar.   Taking medicine to help you relax (sedatives).   Taking pain medicines.   Wearing an eye patch.   Wearing splints. This can make it hard to use parts of your body that you need to drive safely.  HOME CARE   Do not drive until your doctor says it is okay.   Do not use machines until your doctor says it is okay.  You may need a form signed by your doctor (medical release) before you can drive again. You may also need this form before you do other tasks where you need to be fully alert. MAKE SURE YOU:  Understand these instructions.   Will watch your condition.   Will get help right away if you are not doing well or get worse.  Document Released: 09/26/2004 Document Revised: 08/08/2011 Document Reviewed: 12/27/2009 [employer] Patient Information 2012 [employer]. 

## 2012-02-05 ENCOUNTER — Encounter: Payer: Self-pay | Admitting: Family Medicine

## 2012-02-05 ENCOUNTER — Ambulatory Visit (INDEPENDENT_AMBULATORY_CARE_PROVIDER_SITE_OTHER): Payer: Medicare Other | Admitting: Family Medicine

## 2012-02-05 VITALS — BP 134/77 | HR 80 | Temp 97.7°F | Wt 178.1 lb

## 2012-02-05 DIAGNOSIS — H543 Unqualified visual loss, both eyes: Secondary | ICD-10-CM

## 2012-02-05 DIAGNOSIS — M25561 Pain in right knee: Secondary | ICD-10-CM

## 2012-02-05 DIAGNOSIS — M25569 Pain in unspecified knee: Secondary | ICD-10-CM

## 2012-02-05 DIAGNOSIS — G5600 Carpal tunnel syndrome, unspecified upper limb: Secondary | ICD-10-CM

## 2012-02-05 DIAGNOSIS — G609 Hereditary and idiopathic neuropathy, unspecified: Secondary | ICD-10-CM

## 2012-02-05 DIAGNOSIS — J3801 Paralysis of vocal cords and larynx, unilateral: Secondary | ICD-10-CM

## 2012-02-05 DIAGNOSIS — M1710 Unilateral primary osteoarthritis, unspecified knee: Secondary | ICD-10-CM

## 2012-02-05 DIAGNOSIS — M1610 Unilateral primary osteoarthritis, unspecified hip: Secondary | ICD-10-CM

## 2012-02-05 DIAGNOSIS — IMO0002 Reserved for concepts with insufficient information to code with codable children: Secondary | ICD-10-CM

## 2012-02-05 NOTE — Assessment & Plan Note (Deleted)
Hx of vocal cord paralysis--had Botox type injection by Dr Norton Pastel (approx 2010). During ENT eval prior to surgery for anterior discectomy, they ENT said no evidence of paralysis of either cord now.

## 2012-02-05 NOTE — Progress Notes (Signed)
  Subjective:    Patient ID: [name] [name], female    DOB: [birth year], 61 y.o.   MRN: [id number]  HPI #1. followup recent hospitalization for cervical discectomy. She saw the neurosurgeon yesterday. She continues to improve. She still has some pain in her left shoulder that was more severe after surgery than before, but this is improving. Overall she has improvement bilaterally in her hand numbness although the left hand is still more numb than the right. She is also had relief of the chronic mid back pain that she's been complaining of for years. She feels more stable on her feet.  #2. DM: Has not had any low blood sugar episodes. She's taking her medicines regularly. She's not been checking her blood sugars very regularly. No unusual weight loss or weight gain. Needs toenails clipped.  #3. Legal blindness: received her new glasses yesterday and is doing well with that.     Review of Systems See HPI above    Objective:   Physical Exam  Vital signs reviewed. GENERAL: Well-developed, well-nourished, no acute distress. Walking with walker. She is here with her son. (Avis) CARDIOVASCULAR: Regular rate and rhythm no murmur gallop or rub NECK: wearing soft collar for comfort--I removed that and she has well healing right neck incision. No sign of infection. LUNGS: Clear to auscultation bilaterally, no rales or wheeze. ABDOMEN: Soft positive bowel sounds MSK: Movement of extremity x 4. Can stand without assistance but still pretty unsteady.Walking OK with her fancy walker. EXT: feet are without lesion, small increase in callous laterally. Great toenails were dystrophic and I trimmed those.  NEURO: decreased sensation to soft touch and to pinprick B feet to above ankle. Also hands decreased sensation to soft  Touch. Her left hand grip strength is now improved to 4/5 (prev 2-3 /5).           Assessment & Plan:  Continue Vitamin D (placed on that by [facility]) likely get a level at next visit  per dc summary recs

## 2012-02-10 ENCOUNTER — Other Ambulatory Visit: Payer: Self-pay | Admitting: Family Medicine

## 2012-02-27 ENCOUNTER — Other Ambulatory Visit: Payer: Self-pay | Admitting: Family Medicine

## 2012-02-28 ENCOUNTER — Ambulatory Visit (INDEPENDENT_AMBULATORY_CARE_PROVIDER_SITE_OTHER): Payer: Medicare Other | Admitting: Internal Medicine

## 2012-02-28 ENCOUNTER — Encounter: Payer: Self-pay | Admitting: Internal Medicine

## 2012-02-28 VITALS — BP 156/90 | HR 80 | Ht 61.0 in | Wt 180.0 lb

## 2012-02-28 DIAGNOSIS — G4733 Obstructive sleep apnea (adult) (pediatric): Secondary | ICD-10-CM

## 2012-02-28 DIAGNOSIS — J301 Allergic rhinitis due to pollen: Secondary | ICD-10-CM

## 2012-02-28 NOTE — Progress Notes (Signed)
Patient ID: [name] [name], female    DOB: September 28, 1950, 61 y.o.   MRN: [id number]  HPI 01/24/11 22 yoF former smoker followed for asthma, sleep apnea, complicated by hx depression, vocal cord paralysis, GERD. Last here August 17, 2010- note reviewed  Doesn't wear CPAP- can't tolerate it on her face. Didn't like nasal pillows or her current nasal mask. Has own teeth. Aware she tosses and turns at night. CPAP 12 Advanced.  Denies recent asthma attacks. Uses rescue inhaler 1-2x/ month. Continues allergy shots and says she breathes better and coughs less since she has been on them.  No longer chokes or coughs much with eating as she used to. No recent colds.   07/31/11- 73 yoF former smoker followed for asthma, sleep apnea, complicated by hx depression, vocal cord paralysis, GERD. Has had flu vaccine. Continues CPAP at 12 CWP,  used all night every night. She had moved, and can't find her nebulizer machine. We discussed whether she would use it this winter. She continues allergy vaccine and believes that has helped.  02/28/12- 77 yoF former smoker followed for asthma, sleep apnea, complicated by hx depression, vocal cord paralysis, GERD. Has not had allergy injection since early part of May due to surgery; good and bad days with sleep-uses CPAP sometimes but not every night. She had cervical spine disc surgery in May and still wears a neck brace and uses a walker. She has dropped off of allergy vaccine. Able to use her CPAP/12 CWP less regularly. She expects this to improve as she recuperates.  Review of Systems-see HPI Constitutional:   No-   weight loss, night sweats, fevers, chills, fatigue,+ lassitude. HEENT:   No-  headaches, difficulty swallowing, tooth/dental problems, sore throat,       No-  sneezing, itching, ear ache, nasal congestion, post nasal drip,  CV:  No-   chest pain, orthopnea, PND, swelling in lower extremities, anasarca, dizziness, palpitations Resp: No-   shortness of breath  with exertion or at rest.              No-   productive cough,  No non-productive cough,  No- coughing up of blood.              No-   change in color of mucus.  Occasional- wheezing.   Skin: No-   rash or lesions. GI:  No-   heartburn, indigestion, abdominal pain, nausea, vomiting,  GU:. MS:  Carpal tunnel pains.  . Neuro-     nothing unusual Psych:  No- change in mood or affect. No depression or anxiety.  No memory loss.     Objective:   Physical Exam General- Alert, Oriented, Affect-appropriate, Distress- none acute Skin- rash-none, lesions- none, excoriation- none Lymphadenopathy- none Head- atraumatic            Eyes- Very thick corrective lenses, PERRLA, conjunctivae clear secretions            Ears- Hearing, canals-normal            Nose- Clear, no-Septal dev, mucus, polyps, erosion, perforation             Throat- Mallampati II , mucosa clear , drainage- none, tonsils- atrophic. + Neck brace Neck- flexible , trachea midline, no stridor , thyroid nl, carotid no bruit Chest - symmetrical excursion , unlabored           Heart/CV- RRR , no murmur , no gallop  , no rub, nl s1 s2                           -  JVD- none , edema- none, stasis changes- none, varices- none           Lung- clear to P&A, wheeze- none, cough- none , dullness-none, rub- none           Chest wall-  Abd-  Br/ Gen/ Rectal- Not done, not indicated Extrem- cyanosis- none, clubbing, none, atrophy- none, + walker Neuro- grossly intact to observation

## 2012-02-28 NOTE — Patient Instructions (Addendum)
We will stop your allergy shots now, since you have been off them, and see how you do without. We can always start back if needed.   We want you back using your CPAP all night, every night, as soon as possible.   Try putting your CPAP on a low stool or something to support it so that the hose hangs down a little, to drain water back into the machine.

## 2012-02-29 ENCOUNTER — Emergency Department (HOSPITAL_COMMUNITY)
Admission: EM | Admit: 2012-02-29 | Discharge: 2012-02-29 | Disposition: A | Discharge status: Home / Self Care | Payer: Medicare Other | Attending: Emergency Medicine | Admitting: Emergency Medicine

## 2012-02-29 ENCOUNTER — Emergency Department (HOSPITAL_COMMUNITY): Payer: Medicare Other

## 2012-02-29 ENCOUNTER — Encounter (HOSPITAL_COMMUNITY): Payer: Self-pay | Admitting: *Deleted

## 2012-02-29 DIAGNOSIS — N390 Urinary tract infection, site not specified: Secondary | ICD-10-CM | POA: Insufficient documentation

## 2012-02-29 DIAGNOSIS — R109 Unspecified abdominal pain: Secondary | ICD-10-CM

## 2012-02-29 DIAGNOSIS — Z79899 Other long term (current) drug therapy: Secondary | ICD-10-CM | POA: Insufficient documentation

## 2012-02-29 DIAGNOSIS — J4489 Other specified chronic obstructive pulmonary disease: Secondary | ICD-10-CM | POA: Insufficient documentation

## 2012-02-29 DIAGNOSIS — Z7982 Long term (current) use of aspirin: Secondary | ICD-10-CM | POA: Insufficient documentation

## 2012-02-29 DIAGNOSIS — G4730 Sleep apnea, unspecified: Secondary | ICD-10-CM | POA: Insufficient documentation

## 2012-02-29 DIAGNOSIS — E119 Type 2 diabetes mellitus without complications: Secondary | ICD-10-CM | POA: Insufficient documentation

## 2012-02-29 DIAGNOSIS — J449 Chronic obstructive pulmonary disease, unspecified: Secondary | ICD-10-CM | POA: Insufficient documentation

## 2012-02-29 DIAGNOSIS — Z87891 Personal history of nicotine dependence: Secondary | ICD-10-CM | POA: Insufficient documentation

## 2012-02-29 LAB — COMPREHENSIVE METABOLIC PANEL
ALT: 30 U/L (ref 0–35)
AST: 37 U/L (ref 0–37)
Albumin: 3.7 g/dL (ref 3.5–5.2)
Alkaline Phosphatase: 103 U/L (ref 39–117)
BUN: 5 mg/dL — ABNORMAL LOW (ref 6–23)
CO2: 26 mEq/L (ref 19–32)
Calcium: 10.1 mg/dL (ref 8.4–10.5)
Chloride: 106 mEq/L (ref 96–112)
Creatinine, Ser: 0.56 mg/dL (ref 0.50–1.10)
GFR calc Af Amer: >90 mL/min (ref >90)
GFR calc non Af Amer: >90 mL/min (ref >90)
Glucose, Bld: 130 mg/dL — ABNORMAL HIGH (ref 70–99)
Potassium: 3.6 mEq/L (ref 3.5–5.1)
Sodium: 145 mEq/L (ref 135–145)
Total Bilirubin: 0.2 mg/dL — ABNORMAL LOW (ref 0.3–1.2)
Total Protein: 7.3 g/dL (ref 6.0–8.3)

## 2012-02-29 LAB — CBC WITH DIFFERENTIAL/PLATELET
Basophils Absolute: 0 10*3/uL (ref 0.0–0.1)
Basophils Relative: 0 % (ref 0–1)
Eosinophils Absolute: 0.1 10*3/uL (ref 0.0–0.7)
Eosinophils Relative: 1 % (ref 0–5)
HCT: 37.3 % (ref 36.0–46.0)
Hemoglobin: 11.8 g/dL — ABNORMAL LOW (ref 12.0–15.0)
Lymphocytes Relative: 32 % (ref 12–46)
Lymphs Abs: 2.8 10*3/uL (ref 0.7–4.0)
MCH: 27.6 pg (ref 26.0–34.0)
MCHC: 31.6 g/dL (ref 30.0–36.0)
MCV: 87.1 fL (ref 78.0–100.0)
Monocytes Absolute: 0.5 10*3/uL (ref 0.1–1.0)
Monocytes Relative: 6 % (ref 3–12)
Neutro Abs: 5.2 10*3/uL (ref 1.7–7.7)
Neutrophils Relative %: 61 % (ref 43–77)
Platelets: 204 10*3/uL (ref 150–400)
RBC: 4.28 MIL/uL (ref 3.87–5.11)
RDW: 14.9 % (ref 11.5–15.5)
WBC: 8.6 10*3/uL (ref 4.0–10.5)

## 2012-02-29 LAB — URINE MICROSCOPIC-ADD ON

## 2012-02-29 LAB — URINALYSIS, ROUTINE W REFLEX MICROSCOPIC
Bilirubin Urine: NEGATIVE
Glucose, UA: NEGATIVE mg/dL
Hgb urine dipstick: NEGATIVE
Ketones, ur: NEGATIVE mg/dL
Nitrite: NEGATIVE
Protein, ur: NEGATIVE mg/dL
Specific Gravity, Urine: 1.022 (ref 1.005–1.030)
Urobilinogen, UA: 1 mg/dL (ref 0.0–1.0)
pH: 8.5 — ABNORMAL HIGH (ref 5.0–8.0)

## 2012-02-29 LAB — LIPASE, BLOOD: Lipase: 37 U/L (ref 11–59)

## 2012-02-29 MED ORDER — CEPHALEXIN 500 MG PO CAPS: 500.0000 mg | ORAL_CAPSULE | Freq: Four times a day (QID) | ORAL | Status: AC

## 2012-02-29 MED ORDER — MORPHINE SULFATE 4 MG/ML IJ SOLN
4.0000 mg | Freq: Once | INTRAMUSCULAR | Status: AC
  Administered 2012-02-29: 4 mg via INTRAVENOUS
  Filled 2012-02-29: qty 1

## 2012-02-29 MED ORDER — IOHEXOL 300 MG/ML  SOLN
100.0000 mL | Freq: Once | INTRAMUSCULAR | Status: AC | PRN
  Administered 2012-02-29: 100 mL via INTRAVENOUS

## 2012-02-29 MED ORDER — HYDROCODONE-ACETAMINOPHEN 5-500 MG PO TABS: 1.0000 | ORAL_TABLET | Freq: Four times a day (QID) | ORAL | Status: AC | PRN

## 2012-02-29 MED ORDER — ONDANSETRON HCL 4 MG/2ML IJ SOLN
4.0000 mg | Freq: Once | INTRAMUSCULAR | Status: AC
  Administered 2012-02-29: 4 mg via INTRAVENOUS
  Filled 2012-02-29: qty 2

## 2012-02-29 MED ORDER — IOHEXOL 300 MG/ML  SOLN
20.0000 mL | INTRAMUSCULAR | Status: AC
  Administered 2012-02-29 (×2): 20 mL via ORAL

## 2012-02-29 NOTE — ED Notes (Signed)
Reports left side abd pain since yesterday, thinks its related to diverticulitis. Denies n/v/d. No distress noted at triage.

## 2012-02-29 NOTE — ED Provider Notes (Signed)
History     CSN: [id number]  Arrival date & time 02/29/12  1441   First MD Initiated Contact with Patient 02/29/12 1544      Chief Complaint  Patient presents with  . Abdominal Pain    (Consider location/radiation/quality/duration/timing/severity/associated sxs/prior treatment) Patient is a 61 y.o. female presenting with abdominal pain. The history is provided by the patient.  Abdominal Pain The primary symptoms of the illness include abdominal pain and diarrhea. The primary symptoms of the illness do not include fever, nausea, vomiting, hematemesis or hematochezia.  Additional symptoms associated with the illness include anorexia. Symptoms associated with the illness do not include chills, heartburn, constipation, urgency, hematuria or frequency. Significant associated medical issues include diverticulitis.  PT states onset of sharp, left sided abdominal pain. Associated with diarrhea/loose bowel movements. Denies fever, chills, chest pain, SOB, nausea, vomiting. States hx of  Diverticulitis, feels the same. No urinary symptoms. states did not eat today, states she is afraid of eating because afraid it will make her abdomen hurt.  Past Medical History  Diagnosis Date  . Chronic cough     Now followed by pulmonary  . Sleep apnea     on CPAP 12  . HTN (hypertension)   . Dyslipidemia   . Gout   . History of colonoscopy   . Concussion 11/18/11    "fell at dr's office; hit head"  . Complication of anesthesia     "just wake up coughing; that's all"  . Heart murmur   . Asthma   . COPD (chronic obstructive pulmonary disease)   . Diabetes mellitus type II     "take insulin & pills"  . Blood transfusion   . GERD (gastroesophageal reflux disease)   . Arthritis   . Chronic lower back pain   . Vocal cord paralysis, unilateral partial     "right"  . Depression   . Peripheral neuropathy   . Angina   . Pneumonia     "i've had it once" (11/18/11)  . Exertional dyspnea   . Headache  11/18/11    "I've had mild headaches the last couple days"  . Headache 01/08/12    "pretty constant since 11/18/11's concussion"    Past Surgical History  Procedure Date  . Colonoscopy w/ biopsies 11/21/2010    adenoma polyp--no hi grade dysplasia Dr [address]  . Treatment fistula anal   . Breast reduction surgery 04-21-2003  . Tubal ligation 1987  . Cardiac catheterization 03-02-2004  . Bronchoscopy 07-2007  . Knee arthroscopy 08/2000    right  . Shoulder arthroscopy 08/2000    right  . T-score 09/02/04    -0.54 (low risk currently)  . Total abdominal hysterectomy 10-07-2000  . Cesarean section 1987  . Shoulder arthroscopy w/ rotator cuff repair 10/18/2003    left  . Cataract extraction 1955; 1979    bilateral; right eye  . Eye surgery   . Breast biopsy     bilaterally  . Breast cyst excision     right twice; left once  . Carpal tunnel release     left  . Anterior cervical decomp/discectomy fusion 01/15/2012    Procedure: ANTERIOR CERVICAL DECOMPRESSION/DISCECTOMY FUSION 3 LEVELS;  Surgeon: Tia Alert, MD;  Location: [facility];  Service: Neurosurgery;  Laterality: N/A;  Anterior Cervical Decompression Discectomy Fusion Cerivcal three four, cervical four five, cervical five six, cervical six seven    Family History  Problem Relation Age of Onset  . Diabetes Mother   .  Colon cancer Neg Hx     History  Substance Use Topics  . Smoking status: Former Smoker -- 0.5 packs/day for .5 years    Types: Cigarettes    Quit date: 09/03/1975  . Smokeless tobacco: Never Used  . Alcohol Use: No    OB History    Grav Para Term Preterm Abortions TAB SAB Ect Mult Living                  Review of Systems  Constitutional: Negative for fever and chills.  Respiratory: Negative.   Cardiovascular: Negative.   Gastrointestinal: Positive for abdominal pain, diarrhea and anorexia. Negative for heartburn, nausea, vomiting, constipation, blood in stool, hematochezia and hematemesis.    Genitourinary: Negative for urgency, frequency and hematuria.  Musculoskeletal: Negative.   Skin: Negative.   Neurological: Negative for dizziness, weakness and numbness.    Allergies  Amlodipine besylate; Lisinopril-hydrochlorothiazide; Propoxyphene hcl; Valsartan; and Metronidazole  Home Medications   Current Outpatient Rx  Name Route Sig Dispense Refill  . ACYCLOVIR 400 MG PO TABS Oral Take 400 mg by mouth 2 (two) times daily.    . ALBUTEROL SULFATE HFA 108 (90 BASE) MCG/ACT IN AERS Inhalation Inhale 2 puffs into the lungs every 4 (four) hours as needed. For shortness of breath    . ALLOPURINOL 300 MG PO TABS Oral Take 300 mg by mouth daily.    . ARFORMOTEROL TARTRATE 15 MCG/2ML IN NEBU Nebulization Take 15 mcg by nebulization 2 (two) times daily as needed. For shortness of breath    . ASPIRIN EC 81 MG PO TBEC Oral Take 81 mg by mouth daily.     [name] BRIMONIDINE TARTRATE 0.1 % OP SOLN Left Eye Place 1 drop into the left eye 2 (two) times daily.      [name] DOCUSATE SODIUM 100 MG PO CAPS Oral Take 100 mg by mouth 2 (two) times daily as needed. For constipation    . ESOMEPRAZOLE MAGNESIUM 40 MG PO CPDR Oral Take 40 mg by mouth 2 (two) times daily.    . INSULIN GLARGINE 100 UNIT/ML [place] SOLN Subcutaneous Inject 40 Units into the skin at bedtime. 10 mL 5  . LATANOPROST 0.005 % OP SOLN Both Eyes Place 1 drop into both eyes at bedtime.    [name] LOSARTAN POTASSIUM 50 MG PO TABS Oral Take 1 tablet (50 mg total) by mouth daily. 30 tablet 4  . METFORMIN HCL 1000 MG PO TABS Oral Take 1,000-1,500 mg by mouth 2 (two) times daily with a meal. Take 1.5 tablet in the morning and 1 tablet in the evening    . METOPROLOL TARTRATE 50 MG PO TABS Oral Take 1 tablet (50 mg total) by mouth 2 (two) times daily. 1 tablet 0  . MONTELUKAST SODIUM 10 MG PO TABS Oral Take 10 mg by mouth daily.    [name] NITROGLYCERIN 0.4 MG SL SUBL Sublingual Place 0.4 mg under the tongue every 5 (five) minutes as needed. For chest pain    .  POLYETHYLENE GLYCOL 3350 PO PACK Oral Take 17 g by mouth daily as needed. For constipation    . PREGABALIN 100 MG PO CAPS Oral Take 100-200 mg by mouth 2 (two) times daily. 1 capsule in the AM and 2 capsule at bedtime.    . SERTRALINE HCL 100 MG PO TABS Oral Take 150 mg by mouth at bedtime.    [name] SIMVASTATIN 40 MG PO TABS Oral Take 40 mg by mouth daily.    . TRAMADOL  HCL 50 MG PO TABS Oral Take 1-2 tablets (50-100 mg total) by mouth every 8 (eight) hours as needed. For joint pain. 30 tablet 2  . VITAMIN D (ERGOCALCIFEROL) 50000 UNITS PO CAPS Oral Take 50,000 Units by mouth every 7 (seven) days. On Sunday.    [name] HYDROCHLOROTHIAZIDE 12.5 MG PO CAPS Oral Take 1 capsule (12.5 mg total) by mouth daily. 30 capsule 4    BP 152/73  Pulse 89  Temp 98.2 F (36.8 C)  Resp 12  SpO2 100%  Physical Exam  Nursing note and vitals reviewed. Constitutional: She is oriented to person, place, and time. She appears well-developed and well-nourished. No distress.  HENT:  Head: Normocephalic.  Eyes: Conjunctivae are normal. Pupils are equal, round, and reactive to light.  Neck: Neck supple.  Cardiovascular: Normal rate, regular rhythm and normal heart sounds.   Pulmonary/Chest: Effort normal and breath sounds normal. No respiratory distress. She has no wheezes. She has no rales.  Abdominal: Soft. Bowel sounds are normal. She exhibits no distension.       Tenderness in LUQ and LLQ. No guarding, no rebound tenderness  Musculoskeletal: She exhibits no edema.  Neurological: She is alert and oriented to person, place, and time.  Skin: Skin is warm and dry.  Psychiatric: She has a normal mood and affect.    ED Course  Procedures (including critical care time)  Pt with left sided abdominal pain. She is non toxic. No distress. Hypertensive, otherwise normal VS. Hx of diverticulitis, suspect the same. Will get labs, UA, monitor.  Results for orders placed during the hospital encounter of 02/29/12  CBC WITH  DIFFERENTIAL      Component Value Range   WBC 8.6  4.0 - 10.5 K/uL   RBC 4.28  3.87 - 5.11 MIL/uL   Hemoglobin 11.8 (*) 12.0 - 15.0 g/dL   HCT 96.0  45.4 - 09.8 %   MCV 87.1  78.0 - 100.0 fL   MCH 27.6  26.0 - 34.0 pg   MCHC 31.6  30.0 - 36.0 g/dL   RDW 11.9  14.7 - 82.9 %   Platelets 204  150 - 400 K/uL   Neutrophils Relative 61  43 - 77 %   Neutro Abs 5.2  1.7 - 7.7 K/uL   Lymphocytes Relative 32  12 - 46 %   Lymphs Abs 2.8  0.7 - 4.0 K/uL   Monocytes Relative 6  3 - 12 %   Monocytes Absolute 0.5  0.1 - 1.0 K/uL   Eosinophils Relative 1  0 - 5 %   Eosinophils Absolute 0.1  0.0 - 0.7 K/uL   Basophils Relative 0  0 - 1 %   Basophils Absolute 0.0  0.0 - 0.1 K/uL  COMPREHENSIVE METABOLIC PANEL      Component Value Range   Sodium 145  135 - 145 mEq/L   Potassium 3.6  3.5 - 5.1 mEq/L   Chloride 106  96 - 112 mEq/L   CO2 26  19 - 32 mEq/L   Glucose, Bld 130 (*) 70 - 99 mg/dL   BUN 5 (*) 6 - 23 mg/dL   Creatinine, Ser 5.62  0.50 - 1.10 mg/dL   Calcium 13.0  8.4 - 86.5 mg/dL   Total Protein 7.3  6.0 - 8.3 g/dL   Albumin 3.7  3.5 - 5.2 g/dL   AST 37  0 - 37 U/L   ALT 30  0 - 35 U/L   Alkaline Phosphatase 103  39 - 117 U/L   Total Bilirubin 0.2 (*) 0.3 - 1.2 mg/dL   GFR calc non Af Amer >90  >90 mL/min   GFR calc Af Amer >90  >90 mL/min  LIPASE, BLOOD      Component Value Range   Lipase 37  11 - 59 U/L  URINALYSIS, ROUTINE W REFLEX MICROSCOPIC      Component Value Range   Color, Urine AMBER (*) YELLOW   APPearance CLEAR  CLEAR   Specific Gravity, Urine 1.022  1.005 - 1.030   pH 8.5 (*) 5.0 - 8.0   Glucose, UA NEGATIVE  NEGATIVE mg/dL   Hgb urine dipstick NEGATIVE  NEGATIVE   Bilirubin Urine NEGATIVE  NEGATIVE   Ketones, ur NEGATIVE  NEGATIVE mg/dL   Protein, ur NEGATIVE  NEGATIVE mg/dL   Urobilinogen, UA 1.0  0.0 - 1.0 mg/dL   Nitrite NEGATIVE  NEGATIVE   Leukocytes, UA MODERATE (*) NEGATIVE  URINE MICROSCOPIC-ADD ON      Component Value Range   Squamous Epithelial /  LPF FEW (*) RARE   WBC, UA 21-50  <3 WBC/hpf   Bacteria, UA RARE  RARE   Urine-Other MUCOUS PRESENT     Pt continues to have pain. CT abd/pelvis ordered. No peritoneal signs, will r/o diverticulitis/colitis.    Ct Abdomen Pelvis W Contrast  02/29/2012  *RADIOLOGY REPORT*  Clinical Data: Left-sided abdominal pain.  Possible diverticulitis.  CT ABDOMEN AND PELVIS WITH CONTRAST  Technique:  Multidetector CT imaging of the abdomen and pelvis was performed following the standard protocol during bolus administration of intravenous contrast.  Contrast: OMNIPAQUE IOHEXOL 300 MG/ML  SOLN  Comparison: 03/15/2011  Findings: Lung bases are clear.  No pleural or pericardial fluid. The liver has a normal appearance without focal lesions or biliary ductal dilatation.  No calcified gallstones.  The spleen is normal. The pancreas is normal.  The adrenal glands are normal.  The kidneys are normal.  No cyst, mass, stone or hydronephrosis.  There is atherosclerosis of the aorta but no aneurysm.  The IVC is normal.  No retroperitoneal mass or adenopathy.  No free intraperitoneal fluid or air.  The appendix is normal.  There is diverticulosis of the colon but no visible diverticulitis.  Low- level diverticulitis can be inapparent at imaging.  IMPRESSION: No acute finding.  The patient does have diverticulosis of the sigmoid colon but there is no imaging evidence of diverticulitis at this time.  No specific cause of the patient's symptoms is identified.  Original Report Authenticated By: Thomasenia Sales, M.D.   8:51 PM CT negative. Abdomen reassessed. Soft. Mild tenderness to the left upper and lower abdomen.  No guarding. Possible musculoskeletal pain. No cva tenderness, doubt pyelonephritis, will cover for UTI. Will d/c home. Instructed to follow up with pcp in 3 days, return if worsening.   Filed Vitals:   02/29/12 1900  BP: 165/81  Pulse: 77  Temp:   Resp:      1. Abdominal pain   2. UTI (lower urinary  tract infection)       MDM          Lottie Mussel, PA 03/01/12 0110

## 2012-02-29 NOTE — Discharge Instructions (Signed)
Your CT scan today did not show any serious findings, no diverticulitis. Take ibuprofen or tylenol for pain. vicodin for severe pain only. Keflex for urinary tract infection. Drink plenty of fluids. See your doctor Monday or Tuesday for recheck. Return if worsening.  Abdominal Pain (Nonspecific) Your exam might not show the exact reason you have abdominal pain. Since there are many different causes of abdominal pain, another checkup and more tests may be needed. It is very important to follow up for lasting (persistent) or worsening symptoms. A possible cause of abdominal pain in any person who still has his or her appendix is acute appendicitis. Appendicitis is often hard to diagnose. Normal blood tests, urine tests, ultrasound, and CT scans do not completely rule out early appendicitis or other causes of abdominal pain. Sometimes, only the changes that happen over time will allow appendicitis and other causes of abdominal pain to be determined. Other potential problems that may require surgery may also take time to become more apparent. Because of this, it is important that you follow all of the instructions below. HOME CARE INSTRUCTIONS   Rest as much as possible.   Do not eat solid food until your pain is gone.   While adults or children have pain: A diet of water, weak decaffeinated tea, broth or bouillon, gelatin, oral rehydration solutions (ORS), frozen ice pops, or ice chips may be helpful.   When pain is gone in adults or children: Start a light diet (dry toast, crackers, applesauce, or white rice). Increase the diet slowly as long as it does not bother you. Eat no dairy products (including cheese and eggs) and no spicy, fatty, fried, or high-fiber foods.   Use no alcohol, caffeine, or cigarettes.   Take your regular medicines unless your caregiver told you not to.   Take any prescribed medicine as directed.   Only take over-the-counter or prescription medicines for pain, discomfort, or  fever as directed by your caregiver. Do not give aspirin to children.  If your caregiver has given you a follow-up appointment, it is very important to keep that appointment. Not keeping the appointment could result in a permanent injury and/or lasting (chronic) pain and/or disability. If there is any problem keeping the appointment, you must call to reschedule.  SEEK IMMEDIATE MEDICAL CARE IF:   Your pain is not gone in 24 hours.   Your pain becomes worse, changes location, or feels different.   You or your child has an oral temperature above 102 F (38.9 C), not controlled by medicine.   Your baby is older than 3 months with a rectal temperature of 102 F (38.9 C) or higher.   Your baby is 84 months old or younger with a rectal temperature of 100.4 F (38 C) or higher.   You have shaking chills.   You keep throwing up (vomiting) or cannot drink liquids.   There is blood in your vomit or you see blood in your bowel movements.   Your bowel movements become dark or black.   You have frequent bowel movements.   Your bowel movements stop (become blocked) or you cannot pass gas.   You have bloody, frequent, or painful urination.   You have yellow discoloration in the skin or whites of the eyes.   Your stomach becomes bloated or bigger.   You have dizziness or fainting.   You have chest or back pain.  MAKE SURE YOU:   Understand these instructions.   Will watch your condition.  Will get help right away if you are not doing well or get worse.  Document Released: 08/19/2005 Document Revised: 08/08/2011 Document Reviewed: 07/17/2009 [employer] Patient Information 2012 [place], [place]. Urinary Tract Infection Infections of the urinary tract can start in several places. A bladder infection (cystitis), a kidney infection (pyelonephritis), and a prostate infection (prostatitis) are different types of urinary tract infections (UTIs). They usually get better if treated with medicines  (antibiotics) that kill germs. Take all the medicine until it is gone. You or your child may feel better in a few days, but TAKE ALL MEDICINE or the infection may not respond and may become more difficult to treat. HOME CARE INSTRUCTIONS   Drink enough water and fluids to keep the urine clear or pale yellow. Cranberry juice is especially recommended, in addition to large amounts of water.   Avoid caffeine, tea, and carbonated beverages. They tend to irritate the bladder.   Alcohol may irritate the prostate.   Only take over-the-counter or prescription medicines for pain, discomfort, or fever as directed by your caregiver.  To prevent further infections:  Empty the bladder often. Avoid holding urine for long periods of time.   After a bowel movement, women should cleanse from front to back. Use each tissue only once.   Empty the bladder before and after sexual intercourse.  FINDING OUT THE RESULTS OF YOUR TEST Not all test results are available during your visit. If your or your child's test results are not back during the visit, make an appointment with your caregiver to find out the results. Do not assume everything is normal if you have not heard from your caregiver or the medical facility. It is important for you to follow up on all test results. SEEK MEDICAL CARE IF:   There is back pain.   Your baby is older than 3 months with a rectal temperature of 100.5 F (38.1 C) or higher for more than 1 day.   Your or your child's problems (symptoms) are no better in 3 days. Return sooner if you or your child is getting worse.  SEEK IMMEDIATE MEDICAL CARE IF:   There is severe back pain or lower abdominal pain.   You or your child develops chills.   You have a fever.   Your baby is older than 3 months with a rectal temperature of 102 F (38.9 C) or higher.   Your baby is 60 months old or younger with a rectal temperature of 100.4 F (38 C) or higher.   There is nausea or vomiting.     There is continued burning or discomfort with urination.  MAKE SURE YOU:   Understand these instructions.   Will watch your condition.   Will get help right away if you are not doing well or get worse.  Document Released: 05/29/2005 Document Revised: 08/08/2011 Document Reviewed: 01/01/2007 [facility] Patient Information 2012 [place], [place].

## 2012-03-01 NOTE — ED Provider Notes (Signed)
Medical screening examination/treatment/procedure(s) were performed by non-physician practitioner and as supervising physician I was immediately available for consultation/collaboration.  Selma Rodelo K Linker, MD 03/01/12 1509 

## 2012-03-02 ENCOUNTER — Telehealth: Payer: Self-pay | Admitting: Family Medicine

## 2012-03-02 LAB — URINE CULTURE: Colony Count: 30000

## 2012-03-02 NOTE — Telephone Encounter (Signed)
Called to ask pt which meds she is taking she stated that she is only taking lyrica. She does 200 mg in the morning, and 300 mg at night. She is currently out of lyrica at present and is asking for refill to be sent into [facility].

## 2012-03-02 NOTE — Telephone Encounter (Signed)
Dear [name] [name] Team Can u call her---make sure she is NOT on both lyrica and gabapentin. I received refill request for lyrica written by her dc doctor. Which is she taking/ Let me know THANKS! Denny Levy

## 2012-03-03 ENCOUNTER — Ambulatory Visit (INDEPENDENT_AMBULATORY_CARE_PROVIDER_SITE_OTHER): Payer: Medicare Other | Admitting: Family Medicine

## 2012-03-03 ENCOUNTER — Encounter: Payer: Self-pay | Admitting: Family Medicine

## 2012-03-03 VITALS — BP 146/82 | HR 60 | Ht 61.0 in | Wt 179.0 lb

## 2012-03-03 DIAGNOSIS — E119 Type 2 diabetes mellitus without complications: Secondary | ICD-10-CM

## 2012-03-03 DIAGNOSIS — G609 Hereditary and idiopathic neuropathy, unspecified: Secondary | ICD-10-CM

## 2012-03-03 MED ORDER — PREGABALIN 200 MG PO CAPS: 200.0000 mg | ORAL_CAPSULE | Freq: Two times a day (BID) | ORAL | Status: DC

## 2012-03-03 NOTE — Assessment & Plan Note (Signed)
She has been on allergy vaccine for several years now and has had to drop off recently. We agreed to leave her off, and see how she does

## 2012-03-03 NOTE — Assessment & Plan Note (Signed)
Has had to miss some nights while she felt with her neck surgery. As she recuperates, I have emphasized that our goal is to wear it all night every night and report problems.

## 2012-03-04 NOTE — Assessment & Plan Note (Signed)
Lyrica seems to be improving her symptoms. She's also getting surprising amount of improvement I think from her cervical spine surgery. Hopefully this will continue to improve over the next 6-12 months. Her incision site is healing well area I will refill her with Lyrica and see her back in one to 2 months

## 2012-03-04 NOTE — Assessment & Plan Note (Signed)
Discussed diet. No medication changes.

## 2012-03-04 NOTE — Progress Notes (Signed)
  Subjective:    Patient ID: [name] [name], female    DOB: March 02, 1951, 61 y.o.   MRN: [id number]  HPI  #1. Followup peripheral neuropathy. Steady improvement in her symptoms since she had her neck surgery. Her left hand is actually starting to gain some strength. Her main complaint of burning sensation is still in her lower extremities primarily the right leg. The Lyrica does seem to help that she needs refills. #2. Diabetes mellitus: She's not having any low blood sugars. She continues to struggle with following a good diet. Denies weight loss. #3. Headaches: She has continued to have some intermittent headache since her concussion but these are decreasing in frequency. In general she seems to be doing quite well.  Review of Systems See history of present illness.    Objective:   Physical Exam  Vital signs reviewed. GENERAL: Well-developed, well-nourished, no acute distress. CARDIOVASCULAR: Regular rate and rhythm no murmur gallop or rub LUNGS: Clear to auscultation bilaterally, no rales or wheeze. ABDOMEN: Soft positive bowel sounds NEURO: blind. Decreased sensation bilaterally from chin down and bilaterally firm wrist down. MSK: Movement of extremity x 4. Grip strength on the left is now 3/5 where previously it was 2/5. The right hand is 5 out of 5. Gait is still somewhat unsteady        Assessment & Plan:

## 2012-03-17 ENCOUNTER — Ambulatory Visit: Payer: Medicare Other | Admitting: Family Medicine

## 2012-03-18 ENCOUNTER — Other Ambulatory Visit: Payer: Self-pay | Admitting: Family Medicine

## 2012-03-18 ENCOUNTER — Encounter: Payer: Self-pay | Admitting: Internal Medicine

## 2012-03-18 DIAGNOSIS — Z1231 Encounter for screening mammogram for malignant neoplasm of breast: Secondary | ICD-10-CM

## 2012-03-18 DIAGNOSIS — Z9889 Other specified postprocedural states: Secondary | ICD-10-CM

## 2012-04-02 ENCOUNTER — Other Ambulatory Visit: Payer: Self-pay | Admitting: *Deleted

## 2012-04-02 ENCOUNTER — Other Ambulatory Visit: Payer: Self-pay | Admitting: Family Medicine

## 2012-04-02 DIAGNOSIS — E785 Hyperlipidemia, unspecified: Secondary | ICD-10-CM

## 2012-04-02 MED ORDER — SIMVASTATIN 40 MG PO TABS: 40.0000 mg | ORAL_TABLET | Freq: Every day | ORAL | Status: DC

## 2012-04-02 NOTE — Telephone Encounter (Signed)
Lasix not on medication list.  Records reviewed.  Patient has not been on lasix at least since discharge from nursing home in May when meds were reviewed.  Notes since then do not indicated LE edema or a reason for lasix.  Will decline at this time.

## 2012-04-02 NOTE — Assessment & Plan Note (Signed)
Refill appropriate.  Jennette Kettle on vacation

## 2012-04-10 ENCOUNTER — Telehealth: Payer: Self-pay | Admitting: *Deleted

## 2012-04-10 ENCOUNTER — Encounter: Payer: Self-pay | Admitting: Family Medicine

## 2012-04-10 ENCOUNTER — Emergency Department (HOSPITAL_COMMUNITY): Payer: Medicare Other

## 2012-04-10 ENCOUNTER — Encounter (HOSPITAL_COMMUNITY): Payer: Self-pay | Admitting: Radiology

## 2012-04-10 ENCOUNTER — Emergency Department (HOSPITAL_COMMUNITY)
Admission: EM | Admit: 2012-04-10 | Discharge: 2012-04-10 | Disposition: A | Discharge status: Home / Self Care | Payer: Medicare Other | Attending: Emergency Medicine | Admitting: Emergency Medicine

## 2012-04-10 DIAGNOSIS — Z8739 Personal history of other diseases of the musculoskeletal system and connective tissue: Secondary | ICD-10-CM | POA: Insufficient documentation

## 2012-04-10 DIAGNOSIS — N390 Urinary tract infection, site not specified: Secondary | ICD-10-CM | POA: Insufficient documentation

## 2012-04-10 DIAGNOSIS — J449 Chronic obstructive pulmonary disease, unspecified: Secondary | ICD-10-CM | POA: Insufficient documentation

## 2012-04-10 DIAGNOSIS — G4730 Sleep apnea, unspecified: Secondary | ICD-10-CM | POA: Insufficient documentation

## 2012-04-10 DIAGNOSIS — I10 Essential (primary) hypertension: Secondary | ICD-10-CM | POA: Insufficient documentation

## 2012-04-10 DIAGNOSIS — J4489 Other specified chronic obstructive pulmonary disease: Secondary | ICD-10-CM | POA: Insufficient documentation

## 2012-04-10 DIAGNOSIS — K5790 Diverticulosis of intestine, part unspecified, without perforation or abscess without bleeding: Secondary | ICD-10-CM

## 2012-04-10 DIAGNOSIS — K5730 Diverticulosis of large intestine without perforation or abscess without bleeding: Secondary | ICD-10-CM | POA: Insufficient documentation

## 2012-04-10 DIAGNOSIS — Z87891 Personal history of nicotine dependence: Secondary | ICD-10-CM | POA: Insufficient documentation

## 2012-04-10 LAB — URINALYSIS, ROUTINE W REFLEX MICROSCOPIC
Bilirubin Urine: NEGATIVE
Glucose, UA: NEGATIVE mg/dL
Hgb urine dipstick: NEGATIVE
Ketones, ur: NEGATIVE mg/dL
Nitrite: NEGATIVE
Protein, ur: 30 mg/dL — AB
Specific Gravity, Urine: 1.023 (ref 1.005–1.030)
Urobilinogen, UA: 1 mg/dL (ref 0.0–1.0)
pH: 7.5 (ref 5.0–8.0)

## 2012-04-10 LAB — CBC WITH DIFFERENTIAL/PLATELET
Basophils Absolute: 0 10*3/uL (ref 0.0–0.1)
Basophils Relative: 0 % (ref 0–1)
Eosinophils Absolute: 0.1 10*3/uL (ref 0.0–0.7)
Eosinophils Relative: 1 % (ref 0–5)
HCT: 37.9 % (ref 36.0–46.0)
Hemoglobin: 12.2 g/dL (ref 12.0–15.0)
Lymphocytes Relative: 32 % (ref 12–46)
Lymphs Abs: 2.7 10*3/uL (ref 0.7–4.0)
MCH: 27.2 pg (ref 26.0–34.0)
MCHC: 32.2 g/dL (ref 30.0–36.0)
MCV: 84.6 fL (ref 78.0–100.0)
Monocytes Absolute: 0.5 10*3/uL (ref 0.1–1.0)
Monocytes Relative: 6 % (ref 3–12)
Neutro Abs: 5.3 10*3/uL (ref 1.7–7.7)
Neutrophils Relative %: 62 % (ref 43–77)
Platelets: 200 10*3/uL (ref 150–400)
RBC: 4.48 MIL/uL (ref 3.87–5.11)
RDW: 15 % (ref 11.5–15.5)
WBC: 8.6 10*3/uL (ref 4.0–10.5)

## 2012-04-10 LAB — COMPREHENSIVE METABOLIC PANEL
ALT: 40 U/L — ABNORMAL HIGH (ref 0–35)
AST: 43 U/L — ABNORMAL HIGH (ref 0–37)
Albumin: 3.8 g/dL (ref 3.5–5.2)
Alkaline Phosphatase: 108 U/L (ref 39–117)
BUN: 6 mg/dL (ref 6–23)
CO2: 27 mEq/L (ref 19–32)
Calcium: 10.1 mg/dL (ref 8.4–10.5)
Chloride: 106 mEq/L (ref 96–112)
Creatinine, Ser: 0.54 mg/dL (ref 0.50–1.10)
GFR calc Af Amer: >90 mL/min (ref >90)
GFR calc non Af Amer: >90 mL/min (ref >90)
Glucose, Bld: 126 mg/dL — ABNORMAL HIGH (ref 70–99)
Potassium: 3.7 mEq/L (ref 3.5–5.1)
Sodium: 143 mEq/L (ref 135–145)
Total Bilirubin: 0.3 mg/dL (ref 0.3–1.2)
Total Protein: 7.6 g/dL (ref 6.0–8.3)

## 2012-04-10 LAB — OCCULT BLOOD, POC DEVICE: Fecal Occult Bld: NEGATIVE

## 2012-04-10 LAB — URINE MICROSCOPIC-ADD ON

## 2012-04-10 MED ORDER — IOHEXOL 300 MG/ML  SOLN
80.0000 mL | Freq: Once | INTRAMUSCULAR | Status: AC | PRN
  Administered 2012-04-10: 80 mL via INTRAVENOUS

## 2012-04-10 MED ORDER — CEPHALEXIN 500 MG PO CAPS: 500.0000 mg | ORAL_CAPSULE | Freq: Four times a day (QID) | ORAL | Status: AC

## 2012-04-10 NOTE — ED Provider Notes (Signed)
Pt care assumed from Arthor Captain at 1600.  Please see her note for full HPI details.  Briefly, 61 yo female with PMHx of diverticulosis who presents with 2 day history of worsening LLQ pain.  Labs wnl.  CT pending at time of pt care transfer.  CT showed diverticulosis w/o signs of diverticulitis.  UA with 11-20 WBC.  Will give Rx for Keflex.  Pt has follow-up arranged with PCP early next week.  Return precautions provided.  Clinical Impression: 1. Urinary tract infection 2. Diverticulosis  Cherre Robins, MD 04/10/12 2009

## 2012-04-10 NOTE — ED Notes (Signed)
Left side lower abd. Pain for x 3 days. Having diarrhea but loose today.

## 2012-04-10 NOTE — ED Provider Notes (Signed)
History     CSN: [id number]  Arrival date & time 04/10/12  1128   First MD Initiated Contact with Patient 04/10/12 1434      Chief Complaint  Patient presents with  . Abdominal Pain    (Consider location/radiation/quality/duration/timing/severity/associated sxs/prior treatment) HPI Comments: [name] [name] 61 y.o. female   The chief complaint is: Patient presents with:   Abdominal Pain    Patient with history of diverticulitis, presents with 3 day history of LLQ pain. States that symptoms feel similar to the her previous episodes.   Pain is colicky and achey and at times sharp. She has associated loose stools.  Denies, urinary sxs. Denies vagianl symptoms. She is s/p total hysterectomy.  Denies fevers, chills, myalgias, arthralgias, nausea, vomiting. Denies CP, SOB. Denies hematochezia or melena but cannot see well.     Patient is a 61 y.o. female presenting with abdominal pain. The history is provided by the patient. No language interpreter was used.  Abdominal Pain The primary symptoms of the illness include abdominal pain. The primary symptoms of the illness do not include fever, fatigue, shortness of breath, nausea, vomiting, diarrhea (loose stools), hematemesis, hematochezia, dysuria, vaginal discharge or vaginal bleeding.  Symptoms associated with the illness do not include chills, urgency or hematuria.    Past Medical History  Diagnosis Date  . Chronic cough     Now followed by pulmonary  . Sleep apnea     on CPAP 12  . HTN (hypertension)   . Dyslipidemia   . Gout   . History of colonoscopy   . Concussion 11/18/11    "fell at dr's office; hit head"  . Complication of anesthesia     "just wake up coughing; that's all"  . Heart murmur   . Asthma   . COPD (chronic obstructive pulmonary disease)   . Diabetes mellitus type II     "take insulin & pills"  . Blood transfusion   . GERD (gastroesophageal reflux disease)   . Arthritis   . Chronic lower back pain     . Vocal cord paralysis, unilateral partial     "right"  . Depression   . Peripheral neuropathy   . Angina   . Pneumonia     "i've had it once" (11/18/11)  . Exertional dyspnea   . Headache 11/18/11    "I've had mild headaches the last couple days"  . Headache 01/08/12    "pretty constant since 11/18/11's concussion"    Past Surgical History  Procedure Date  . Colonoscopy w/ biopsies 11/21/2010    adenoma polyp--no hi grade dysplasia [address]  . Treatment fistula anal   . Breast reduction surgery 04-21-2003  . Tubal ligation 1987  . Cardiac catheterization 03-02-2004  . Bronchoscopy 07-2007  . Knee arthroscopy 08/2000    right  . Shoulder arthroscopy 08/2000    right  . T-score 09/02/04    -0.54 (low risk currently)  . Total abdominal hysterectomy 10-07-2000  . Cesarean section 1987  . Shoulder arthroscopy w/ rotator cuff repair 10/18/2003    left  . Cataract extraction 1955; 1979    bilateral; right eye  . Eye surgery   . Breast biopsy     bilaterally  . Breast cyst excision     right twice; left once  . Carpal tunnel release     left  . Anterior cervical decomp/discectomy fusion 01/15/2012    Procedure: ANTERIOR CERVICAL DECOMPRESSION/DISCECTOMY FUSION 3 LEVELS;  Surgeon: Tia Alert, MD;  Location: [facility];  Service: Neurosurgery;  Laterality: N/A;  Anterior Cervical Decompression Discectomy Fusion Cerivcal three four, cervical four five, cervical five six, cervical six seven    Family History  Problem Relation Age of Onset  . Diabetes Mother   . Colon cancer Neg Hx     History  Substance Use Topics  . Smoking status: Former Smoker -- 0.5 packs/day for .5 years    Types: Cigarettes    Quit date: 09/03/1975  . Smokeless tobacco: Never Used  . Alcohol Use: No    OB History    Grav Para Term Preterm Abortions TAB SAB Ect Mult Living                  Review of Systems  Constitutional: Negative for fever, chills and fatigue.  Respiratory: Negative for  shortness of breath.   Gastrointestinal: Positive for abdominal pain. Negative for nausea, vomiting, diarrhea (loose stools), blood in stool, hematochezia and hematemesis.  Genitourinary: Negative for dysuria, urgency, hematuria, vaginal bleeding, vaginal discharge and pelvic pain.  Neurological: Negative for weakness and headaches.    Allergies  Amlodipine besylate; Lisinopril-hydrochlorothiazide; Propoxyphene hcl; Valsartan; and Metronidazole  Home Medications   Current Outpatient Rx  Name Route Sig Dispense Refill  . ACYCLOVIR 400 MG PO TABS Oral Take 400 mg by mouth 2 (two) times daily.    . ALBUTEROL SULFATE HFA 108 (90 BASE) MCG/ACT IN AERS Inhalation Inhale 2 puffs into the lungs every 4 (four) hours as needed. For shortness of breath    . ALLOPURINOL 300 MG PO TABS Oral Take 300 mg by mouth daily.    . ARFORMOTEROL TARTRATE 15 MCG/2ML IN NEBU Nebulization Take 15 mcg by nebulization 2 (two) times daily as needed. For shortness of breath    . ASPIRIN EC 81 MG PO TBEC Oral Take 81 mg by mouth daily.     [name] BRIMONIDINE TARTRATE 0.1 % OP SOLN Left Eye Place 1 drop into the left eye 2 (two) times daily.      [name] DOCUSATE SODIUM 100 MG PO CAPS Oral Take 100 mg by mouth 2 (two) times daily as needed. For constipation    . ESOMEPRAZOLE MAGNESIUM 40 MG PO CPDR Oral Take 40 mg by mouth 2 (two) times daily.    [name] HYDROCHLOROTHIAZIDE 12.5 MG PO CAPS Oral Take 1 capsule (12.5 mg total) by mouth daily. 30 capsule 4  . INSULIN GLARGINE 100 UNIT/ML [place] SOLN Subcutaneous Inject 40 Units into the skin at bedtime. 10 mL 5  . LATANOPROST 0.005 % OP SOLN Both Eyes Place 1 drop into both eyes at bedtime.    [name] LOSARTAN POTASSIUM 50 MG PO TABS Oral Take 1 tablet (50 mg total) by mouth daily. 30 tablet 4  . METFORMIN HCL 1000 MG PO TABS Oral Take 1,000-1,500 mg by mouth 2 (two) times daily with a meal. Take 1.5 tablet in the morning and 1 tablet in the evening    . METOPROLOL TARTRATE 50 MG PO TABS Oral Take 1  tablet (50 mg total) by mouth 2 (two) times daily. 1 tablet 0  . MONTELUKAST SODIUM 10 MG PO TABS Oral Take 10 mg by mouth daily.    [name] NITROGLYCERIN 0.4 MG SL SUBL Sublingual Place 0.4 mg under the tongue every 5 (five) minutes as needed. For chest pain    . POLYETHYLENE GLYCOL 3350 PO PACK Oral Take 17 g by mouth daily as needed. For constipation    . PREGABALIN 200 MG  PO CAPS Oral Take 1 capsule (200 mg total) by mouth 2 (two) times daily. 60 capsule 12  . SERTRALINE HCL 100 MG PO TABS Oral Take 150 mg by mouth at bedtime.    [name] SIMVASTATIN 40 MG PO TABS Oral Take 1 tablet (40 mg total) by mouth daily. 30 tablet 6  . TRAMADOL HCL 50 MG PO TABS Oral Take 1-2 tablets (50-100 mg total) by mouth every 8 (eight) hours as needed. For joint pain. 30 tablet 2  . VITAMIN D (ERGOCALCIFEROL) 50000 UNITS PO CAPS Oral Take 50,000 Units by mouth every 7 (seven) days. On Sunday.      BP 187/87  Pulse 81  Temp 98.2 F (36.8 C) (Oral)  Resp 21  SpO2 100%  Physical Exam  Nursing note and vitals reviewed. Constitutional: She is oriented to person, place, and time. She appears well-developed and well-nourished. No distress.  HENT:  Head: Normocephalic and atraumatic.  Eyes: Conjunctivae are normal. No scleral icterus.  Neck: Normal range of motion.  Cardiovascular: Normal rate, regular rhythm and normal heart sounds.  Exam reveals no gallop and no friction rub.   No murmur heard. Pulmonary/Chest: Effort normal and breath sounds normal. No respiratory distress.  Abdominal: Soft. Bowel sounds are normal. She exhibits no distension and no mass. There is tenderness (LLQ ). There is no guarding.  Genitourinary: Guaiac negative stool.  Neurological: She is alert and oriented to person, place, and time.  Skin: Skin is warm and dry. She is not diaphoretic.    ED Course  Procedures (including critical care time)  Results for orders placed during the hospital encounter of 04/10/12  CBC WITH DIFFERENTIAL       Component Value Range   WBC 8.6  4.0 - 10.5 K/uL   RBC 4.48  3.87 - 5.11 MIL/uL   Hemoglobin 12.2  12.0 - 15.0 g/dL   HCT 47.8  29.5 - 62.1 %   MCV 84.6  78.0 - 100.0 fL   MCH 27.2  26.0 - 34.0 pg   MCHC 32.2  30.0 - 36.0 g/dL   RDW 30.8  65.7 - 84.6 %   Platelets 200  150 - 400 K/uL   Neutrophils Relative 62  43 - 77 %   Neutro Abs 5.3  1.7 - 7.7 K/uL   Lymphocytes Relative 32  12 - 46 %   Lymphs Abs 2.7  0.7 - 4.0 K/uL   Monocytes Relative 6  3 - 12 %   Monocytes Absolute 0.5  0.1 - 1.0 K/uL   Eosinophils Relative 1  0 - 5 %   Eosinophils Absolute 0.1  0.0 - 0.7 K/uL   Basophils Relative 0  0 - 1 %   Basophils Absolute 0.0  0.0 - 0.1 K/uL  COMPREHENSIVE METABOLIC PANEL      Component Value Range   Sodium 143  135 - 145 mEq/L   Potassium 3.7  3.5 - 5.1 mEq/L   Chloride 106  96 - 112 mEq/L   CO2 27  19 - 32 mEq/L   Glucose, Bld 126 (*) 70 - 99 mg/dL   BUN 6  6 - 23 mg/dL   Creatinine, Ser 9.62  0.50 - 1.10 mg/dL   Calcium 95.2  8.4 - 84.1 mg/dL   Total Protein 7.6  6.0 - 8.3 g/dL   Albumin 3.8  3.5 - 5.2 g/dL   AST 43 (*) 0 - 37 U/L   ALT 40 (*) 0 - 35 U/L  Alkaline Phosphatase 108  39 - 117 U/L   Total Bilirubin 0.3  0.3 - 1.2 mg/dL   GFR calc non Af Amer >90  >90 mL/min   GFR calc Af Amer >90  >90 mL/min  OCCULT BLOOD, POC DEVICE      Component Value Range   Fecal Occult Bld NEGATIVE     BP 187/87  Pulse 81  Temp 98.2 F (36.8 C) (Oral)  Resp 21  SpO2 100% Afebrile patient with LLQ pain and loose stools.  Labs are unremarkable except for slight elevationin AST/ALT.  Will get abdominal CT . Patient care will be assumed by Diamantina Monks  No results found.   No diagnosis found.    MDM  Patient care assumed by Natale Milch, MD         Arthor Captain, PA-C 04/10/12 [phone]

## 2012-04-10 NOTE — Telephone Encounter (Signed)
Received call from Case Manager from [facility] ,Charlesetta Shanks RN. States patient has not been able to eat or drink well in 2 days.  Has not taken any meds. Complaining of pain in left lower abdomen  Bowel sounds sluggish RN states.  Advised that since we have no available appointments left  today and it would be best for patient to go to ED now to be evaluated . She voices understanding . Her son will go with her.

## 2012-04-10 NOTE — Telephone Encounter (Signed)
error 

## 2012-04-10 NOTE — ED Notes (Signed)
Patient in scan.  7pm vitals not done.

## 2012-04-10 NOTE — ED Notes (Signed)
Patient given discharge paperwork; went over discharge instructions with patient.  Patient instructed to take Keflex as directed (to finish entire prescription, even if feeling better), to follow up with primary care physician/referral, and to return to the ED for new, worsening, or concerning symptoms.

## 2012-04-10 NOTE — Telephone Encounter (Signed)
This encounter was created in error - please disregard.

## 2012-04-12 LAB — URINE CULTURE: Colony Count: 40000

## 2012-04-13 NOTE — ED Notes (Signed)
+   urine  Patient treated appropriately -sensitive to same-chart appended per protocol MD.  

## 2012-04-13 NOTE — ED Provider Notes (Signed)
History/physical exam/procedure(s) were performed by non-physician practitioner and as supervising physician I was immediately available for consultation/collaboration. I have reviewed all notes and am in agreement with care and plan.   Hilario Quarry, MD 04/13/12 1425

## 2012-04-13 NOTE — ED Provider Notes (Signed)
  I performed a history and physical examination of [name] [name] and discussed her management with Dr.Beavers. I agree with the history, physical, assessment, and plan of care, with the following exceptions: None  I was present for the following procedures: None Time Spent in Critical Care of the patient: None Time spent in discussions with the patient and family:5 Aesha Agrawal Corlis Leak, MD 04/13/12 1442

## 2012-04-15 ENCOUNTER — Ambulatory Visit (INDEPENDENT_AMBULATORY_CARE_PROVIDER_SITE_OTHER): Payer: Medicare Other | Admitting: Family Medicine

## 2012-04-15 ENCOUNTER — Telehealth: Payer: Self-pay | Admitting: Family Medicine

## 2012-04-15 ENCOUNTER — Encounter: Payer: Self-pay | Admitting: Family Medicine

## 2012-04-15 VITALS — BP 170/71 | HR 72 | Temp 98.2°F | Wt 183.0 lb

## 2012-04-15 DIAGNOSIS — E119 Type 2 diabetes mellitus without complications: Secondary | ICD-10-CM

## 2012-04-15 LAB — POCT GLYCOSYLATED HEMOGLOBIN (HGB A1C): Hemoglobin A1C: 6.5

## 2012-04-15 MED ORDER — CIPROFLOXACIN HCL 250 MG PO TABS: 250.0000 mg | ORAL_TABLET | Freq: Three times a day (TID) | ORAL | Status: DC

## 2012-04-15 MED ORDER — HYDROCODONE-ACETAMINOPHEN 5-325 MG PO TABS: 1.0000 | ORAL_TABLET | Freq: Four times a day (QID) | ORAL | Status: AC | PRN

## 2012-04-15 MED ORDER — CIPROFLOXACIN HCL 250 MG PO TABS: 250.0000 mg | ORAL_TABLET | Freq: Three times a day (TID) | ORAL | Status: AC

## 2012-04-15 NOTE — Telephone Encounter (Signed)
Will forward to Dr Neal 

## 2012-04-15 NOTE — Telephone Encounter (Signed)
Spoke with pharmacist and the concern with reaction is Zoloft. Dr. Jennette Kettle notified and she advises  that since patient takes 150 mg , one 100 mg tab and one 50 mg tab to  decrease to just the 50 mg tab while on the Cipro for 10 days. . Patient and pharmacist notified.

## 2012-04-15 NOTE — Telephone Encounter (Signed)
States that the pharmacy will not fill the Cipro because of other meds she is on.  She is at the pharmacy now - [facility]

## 2012-04-19 NOTE — Progress Notes (Signed)
  Subjective:    Patient ID: [name], female    DOB: November 22, 1950, 61 y.o.   MRN: [id number]  HPI  4-5 days of lower abdominal pain---similar to when she had hosp for diverticulitis. Has had cholls. No diarrhea or blood in stool. Crampy abddominal pain worst LLQ. Appetite down but still taking PO well.  Review of Systems See hpi    Objective:   Physical Exam  GEN: appears to be feeling ill but not toxic ABDOMEN bowel sounds +. No rebound or guarding. Mildly diffusely teneder to palpation. No masses BACK no CVA tenderness. CV RRR LUNGS CTA      Assessment & Plan:  Likely early diverticulitis. Will start cipro. She will call w update in 24-48 hours. If worsening sx, she will need admission and she is aware of sx to watch for.

## 2012-05-06 ENCOUNTER — Ambulatory Visit (INDEPENDENT_AMBULATORY_CARE_PROVIDER_SITE_OTHER): Payer: Medicare Other | Admitting: Family Medicine

## 2012-05-06 ENCOUNTER — Encounter: Payer: Self-pay | Admitting: Family Medicine

## 2012-05-06 VITALS — BP 145/59 | HR 75 | Temp 98.1°F | Ht 61.0 in | Wt 184.0 lb

## 2012-05-06 DIAGNOSIS — M1610 Unilateral primary osteoarthritis, unspecified hip: Secondary | ICD-10-CM

## 2012-05-06 DIAGNOSIS — R1084 Generalized abdominal pain: Secondary | ICD-10-CM

## 2012-05-06 DIAGNOSIS — IMO0002 Reserved for concepts with insufficient information to code with codable children: Secondary | ICD-10-CM

## 2012-05-06 DIAGNOSIS — M1710 Unilateral primary osteoarthritis, unspecified knee: Secondary | ICD-10-CM

## 2012-05-06 DIAGNOSIS — G8929 Other chronic pain: Secondary | ICD-10-CM

## 2012-05-07 DIAGNOSIS — R1084 Generalized abdominal pain: Secondary | ICD-10-CM | POA: Insufficient documentation

## 2012-05-07 DIAGNOSIS — G8929 Other chronic pain: Secondary | ICD-10-CM | POA: Insufficient documentation

## 2012-05-07 NOTE — Progress Notes (Signed)
  Subjective:    Patient ID: [name] [name], female    DOB: [birth year], 61 y.o.   MRN: [id number]  HPI  #1. Increasing right knee pain for the last several weeks. Also increasing burning sensation between the knee and ankles the right foot. Burning sensation consistent with her peripheral neuropathy symptoms. No locking or giving way of the knee.  #2. Followup abdominal pain. Improved with the regular MiraLax although she thinks she would like to stop it and see how she does without it because she ends up having multiple bowel movements a day.  #3. Continued low back and left hip pain. Has not gotten any worse but continues to be aggravating most of the day 6/10.  Review of Systems Pertinent review of systems: negative for fever or unusual weight change.    Objective:   Physical Exam  Vital signs reviewed. GENERAL: Well developed, well nourished, no acute distress KNEE: Right. Full range of motion in flexion extension. No effusion. Tender to palpation across the medial joint line. There is no erythema. The calf is soft. ABDOMEN: Soft positive bowel sounds nontender nondistended, no rebound. HIP: Left. Limited internal/external rotation with pain past 10 of external rotation. INJECTION: Patient was given informed consent, signed copy in the chart. Appropriate time out was taken. Area prepped and draped in usual sterile fashion. One cc of methylprednisolone 40 mg/ml plus  4 cc of 1% lidocaine without epinephrine was injected into the right knee using a(n) anterior medial approach. The patient tolerated the procedure well. There were no complications. Post procedure instructions were given.       Assessment & Plan:  #1. Knee pain with some right lower extremity burning sensation most likely consistent with advancing peripheral uropathy. Let us try corticosteroid injection many months ago she does not recall if it helped or not so we opted to try one today. #2. Continued low back and hip  pain with significant osteoarthritis. I have no new solutions for her #3. Abdominal pain seems improved. I'm hesitant about her going off the MiraLax but she seems to be certain she will to do that so we'll see how she does followup 6-8 weeks

## 2012-05-20 ENCOUNTER — Other Ambulatory Visit: Payer: Self-pay | Admitting: Family Medicine

## 2012-05-20 ENCOUNTER — Telehealth: Payer: Self-pay | Admitting: Family Medicine

## 2012-05-20 ENCOUNTER — Ambulatory Visit
Admission: RE | Admit: 2012-05-20 | Discharge: 2012-05-20 | Disposition: A | Discharge status: Home / Self Care | Payer: Medicare Other | Source: Ambulatory Visit | Attending: Family Medicine | Admitting: Family Medicine

## 2012-05-20 DIAGNOSIS — Z9889 Other specified postprocedural states: Secondary | ICD-10-CM

## 2012-05-20 DIAGNOSIS — Z1231 Encounter for screening mammogram for malignant neoplasm of breast: Secondary | ICD-10-CM

## 2012-05-20 DIAGNOSIS — Z13820 Encounter for screening for osteoporosis: Secondary | ICD-10-CM

## 2012-05-20 NOTE — Telephone Encounter (Signed)
Pt was supposed to get bone density test and she was told they don't have the orders for this yet.  pls let her know when it will be set up

## 2012-05-20 NOTE — Telephone Encounter (Signed)
Encounter closed in error.

## 2012-05-20 NOTE — Telephone Encounter (Signed)
Order has been placed will call pt once appt has been made.[name] [name]

## 2012-05-22 ENCOUNTER — Encounter: Payer: Self-pay | Admitting: Internal Medicine

## 2012-05-22 ENCOUNTER — Ambulatory Visit (INDEPENDENT_AMBULATORY_CARE_PROVIDER_SITE_OTHER): Payer: Medicare Other | Admitting: Internal Medicine

## 2012-05-22 VITALS — BP 150/85 | HR 67 | Ht 61.0 in | Wt 188.0 lb

## 2012-05-22 DIAGNOSIS — I10 Essential (primary) hypertension: Secondary | ICD-10-CM

## 2012-05-22 DIAGNOSIS — E785 Hyperlipidemia, unspecified: Secondary | ICD-10-CM

## 2012-05-22 LAB — LIPID PANEL
Cholesterol: 189 mg/dL (ref 0–200)
HDL: 36.7 mg/dL — ABNORMAL LOW (ref >39.00)
LDL Cholesterol: 131 mg/dL — ABNORMAL HIGH (ref 0–99)
Total CHOL/HDL Ratio: 5
Triglycerides: 106 mg/dL (ref 0.0–149.0)
VLDL: 21.2 mg/dL (ref 0.0–40.0)

## 2012-05-22 MED ORDER — LOSARTAN POTASSIUM 100 MG PO TABS: 100.0000 mg | ORAL_TABLET | Freq: Every day | ORAL | Status: DC

## 2012-05-22 NOTE — Progress Notes (Signed)
HPI Patient is a 61 year old with a history of HTN and HL.  Normal myoview in past.  Cath in 2005 with normal coronary arteries.  I saw her in Aug 2012.   Had been on statin in past.  Stopped because of concern about cost.   Patient is in NAD  Denies CP  Notes congestion in back of throat at night.  Starting to use CPAP again  Used last night Occasional reflux. Sugars are up and down. Allergies  Allergen Reactions  . Amlodipine Besylate Swelling  . Lisinopril-Hydrochlorothiazide Cough    REACTION: cough-- changed to arb  . Propoxyphene Hcl Itching  . Valsartan Other (See Comments)    REACTION:  "sleep more the next day after I took it; it made me real tired"  . Metronidazole     REACTION: "just didn't work; my body never did heal from it"    Current Outpatient Prescriptions  Medication Sig Dispense Refill  . acyclovir (ZOVIRAX) 400 MG tablet Take 400 mg by mouth 2 (two) times daily.      [name] albuterol (PROVENTIL HFA;VENTOLIN HFA) 108 (90 BASE) MCG/ACT inhaler Inhale 2 puffs into the lungs every 4 (four) hours as needed. For shortness of breath      . allopurinol (ZYLOPRIM) 300 MG tablet Take 300 mg by mouth daily.      [name] arformoterol (BROVANA) 15 MCG/2ML NEBU Take 15 mcg by nebulization 2 (two) times daily as needed. For shortness of breath      . aspirin EC 81 MG tablet Take 81 mg by mouth daily.       . brimonidine (ALPHAGAN P) 0.1 % SOLN Place 1 drop into the left eye 2 (two) times daily.        [name] docusate sodium (COLACE) 100 MG capsule Take 100 mg by mouth 2 (two) times daily as needed. For constipation      . esomeprazole (NEXIUM) 40 MG capsule Take 40 mg by mouth 2 (two) times daily.      . hydrochlorothiazide (MICROZIDE) 12.5 MG capsule Take 1 capsule (12.5 mg total) by mouth daily.  30 capsule  4  . insulin glargine (LANTUS) 100 UNIT/ML injection Inject 40 Units into the skin at bedtime.  10 mL  5  . latanoprost (XALATAN) 0.005 % ophthalmic solution Place 1 drop into both eyes at  bedtime.      [name] losartan (COZAAR) 50 MG tablet Take 1 tablet (50 mg total) by mouth daily.  30 tablet  4  . metFORMIN (GLUCOPHAGE) 1000 MG tablet Take 1,000-1,500 mg by mouth 2 (two) times daily with a meal. Take 1.5 tablet in the morning and 1 tablet in the evening      . metoprolol (LOPRESSOR) 50 MG tablet Take 1 tablet (50 mg total) by mouth 2 (two) times daily.  1 tablet  0  . montelukast (SINGULAIR) 10 MG tablet Take 10 mg by mouth daily.      . nitroGLYCERIN (NITROSTAT) 0.4 MG SL tablet Place 0.4 mg under the tongue every 5 (five) minutes as needed. For chest pain      . polyethylene glycol (MIRALAX / GLYCOLAX) packet Take 17 g by mouth daily as needed. For constipation      . pregabalin (LYRICA) 200 MG capsule Take 1 capsule (200 mg total) by mouth 2 (two) times daily.  60 capsule  12  . sertraline (ZOLOFT) 100 MG tablet Take 150 mg by mouth at bedtime.      . simvastatin (  ZOCOR) 40 MG tablet Take 1 tablet (40 mg total) by mouth daily.  30 tablet  6  . traMADol (ULTRAM) 50 MG tablet Take 1-2 tablets (50-100 mg total) by mouth every 8 (eight) hours as needed. For joint pain.  30 tablet  2  . Vitamin D, Ergocalciferol, (DRISDOL) 50000 UNITS CAPS Take 50,000 Units by mouth every 7 (seven) days. On Sunday.        Past Medical History  Diagnosis Date  . Chronic cough     Now followed by pulmonary  . Sleep apnea     on CPAP 12  . HTN (hypertension)   . Dyslipidemia   . Gout   . History of colonoscopy   . Concussion 11/18/11    "fell at dr's office; hit head"  . Complication of anesthesia     "just wake up coughing; that's all"  . Heart murmur   . Asthma   . COPD (chronic obstructive pulmonary disease)   . Diabetes mellitus type II     "take insulin & pills"  . Blood transfusion   . GERD (gastroesophageal reflux disease)   . Arthritis   . Chronic lower back pain   . Vocal cord paralysis, unilateral partial     "right"  . Depression   . Peripheral neuropathy   . Angina   .  Pneumonia     "i've had it once" (11/18/11)  . Exertional dyspnea   . Headache 11/18/11    "I've had mild headaches the last couple days"  . Headache 01/08/12    "pretty constant since 11/18/11's concussion"    Past Surgical History  Procedure Date  . Colonoscopy w/ biopsies 11/21/2010    adenoma polyp--no hi grade dysplasia [address]  . Treatment fistula anal   . Breast reduction surgery 04-21-2003  . Tubal ligation 1987  . Cardiac catheterization 03-02-2004  . Bronchoscopy 07-2007  . Knee arthroscopy 08/2000    right  . Shoulder arthroscopy 08/2000    right  . T-score 09/02/04    -0.54 (low risk currently)  . Total abdominal hysterectomy 10-07-2000  . Cesarean section 1987  . Shoulder arthroscopy w/ rotator cuff repair 10/18/2003    left  . Cataract extraction 1955; 1979    bilateral; right eye  . Eye surgery   . Breast biopsy     bilaterally  . Breast cyst excision     right twice; left once  . Carpal tunnel release     left  . Anterior cervical decomp/discectomy fusion 01/15/2012    Procedure: ANTERIOR CERVICAL DECOMPRESSION/DISCECTOMY FUSION 3 LEVELS;  Surgeon: Tia Alert, MD;  Location: [facility];  Service: Neurosurgery;  Laterality: N/A;  Anterior Cervical Decompression Discectomy Fusion Cerivcal three four, cervical four five, cervical five six, cervical six seven    Family History  Problem Relation Age of Onset  . Diabetes Mother   . Colon cancer Neg Hx     History   Social History  . Marital Status: Single    Spouse Name: N/A    Number of Children: 1  . Years of Education: N/A   Occupational History  . unemployed    Social History Main Topics  . Smoking status: Former Smoker -- 0.5 packs/day for .5 years    Types: Cigarettes    Quit date: 09/03/1975  . Smokeless tobacco: Never Used  . Alcohol Use: No  . Drug Use: No  . Sexually Active: Not Currently   Other Topics Concern  .  Not on file   Social History Narrative   Lives alone.Approved for section  8 housing (12/03).Has a son who may be autistic and stays with her at times.    Review of Systems:  All systems reviewed.  They are negative to the above problem except as previously stated.  Vital Signs: BP 150/85  Pulse 67  Ht 5\' 1"  (1.549 m)  Wt 188 lb (85.276 kg)  BMI 35.52 kg/m2  Physical Exam Patient is in NAD  Sleepy HEENT:  Normocephalic, atraumatic. EOMI, PERRLA.  Neck: JVP is normal.  No bruits.  Lungs: clear to auscultation. No rales no wheezes.  Heart: Regular rate and rhythm. Normal S1, S2. No S3.   No significant murmurs. PMI not displaced.  Abdomen:  Supple, nontender. Normal bowel sounds. No masses. No hepatomegaly.  Extremities:   Good distal pulses throughout. No lower extremity edema.  Musculoskeletal :moving all extremities.  Neuro:   alert and oriented x3.  CN II-XII grossly intact.  EKG  SR 67 bpm. LVH.   Assessment and Plan:  1.  HTN  BP is high  Increase Cozaar to 100.  Keep on onther meds.  Keep using CPAPA  2.  HL  Check lipids today.  3.  CP  Denies.

## 2012-05-22 NOTE — Patient Instructions (Addendum)
Your physician recommends that you have lab work today: Lipid profile.  We will call you with your results  Your physician has recommended you make the following change in your medication: Increase Cozaar (losartan) to 100mg  daily  Your physician recommends that you schedule a follow-up appointment in: 4-6 weeks for a blood pressure check  Your physician wants you to follow-up in: 6 months with Dr. Tenny Craw. You will receive a reminder letter in the mail two months in advance. If you don't receive a letter, please call our office to schedule the follow-up appointment.

## 2012-05-25 ENCOUNTER — Telehealth: Payer: Self-pay | Admitting: *Deleted

## 2012-05-25 DIAGNOSIS — E785 Hyperlipidemia, unspecified: Secondary | ICD-10-CM

## 2012-05-25 MED ORDER — ATORVASTATIN CALCIUM 20 MG PO TABS: 20.0000 mg | ORAL_TABLET | Freq: Every day | ORAL | Status: DC

## 2012-05-25 NOTE — Telephone Encounter (Signed)
Patient called with lab results. Pt verbalized understanding. MAR adjust, lab date set, pt agreed to plan. Alfonso Ramus RN

## 2012-05-25 NOTE — Telephone Encounter (Signed)
Message copied by Antony Odea on Mon May 25, 2012  1:24 PM ------      Message from: [place], [place] V      Created: Sun May 24, 2012 11:14 PM       LDL is too high at 131      With her being on insulin I would recomm that she take Lipitor 10 mg.       F/U lipid panel and AST in 8 wks.

## 2012-05-27 ENCOUNTER — Ambulatory Visit (INDEPENDENT_AMBULATORY_CARE_PROVIDER_SITE_OTHER): Payer: Medicare Other | Admitting: Home Health Services

## 2012-05-27 ENCOUNTER — Ambulatory Visit (INDEPENDENT_AMBULATORY_CARE_PROVIDER_SITE_OTHER): Payer: Medicare Other | Admitting: *Deleted

## 2012-05-27 ENCOUNTER — Encounter: Payer: Self-pay | Admitting: Home Health Services

## 2012-05-27 VITALS — BP 146/81 | HR 72 | Temp 97.4°F | Ht 61.0 in | Wt 189.0 lb

## 2012-05-27 DIAGNOSIS — Z0000 Encounter for general adult medical examination without abnormal findings: Secondary | ICD-10-CM

## 2012-05-27 DIAGNOSIS — Z23 Encounter for immunization: Secondary | ICD-10-CM

## 2012-05-27 NOTE — Patient Instructions (Signed)
1. Continue walking 1 day a week and consider adding an extra day of exercise per week.  2. Focus on eating 3-5 fruits and vegetables a day. 3. Be cautious when walking, be sure to use walker for balance.

## 2012-05-27 NOTE — Progress Notes (Signed)
Patient here for annual wellness visit, patient reports: Risk Factors/Conditions needing evaluation or treatment: Pt does not have any new risk factors that need evaluation.  Home Safety: Pt lives with son in 1 story home.  Pt reports having some detectors. Other Information: Corrective lens: Pt wears daily corrective lens. Has yearly visits with eye dr.  Robbie Louis: Pt does not have dentures. Does not have regular dental visits. Memory: Pt denies memory problems.  Patient's Mini Mental Score (recorded in doc. flowsheet): 30 Pt reports having hearing exams and was given hearing aids, which have been lost for some time.   Balance/Gait: Pt is dependent on walker for mobility.  Could not access.  Balance Abnormal Patient value  Sitting balance    Sit to stand    Attempts to arise    Immediate standing balance    Standing balance    Nudge    Eyes closed- Romberg    Tandem stance    Back lean    Neck Rotation    360 degree turn    Sitting down     Gait Abnormal Patient value  Initiation of gait    Step length-left    Step length-right    Step height-left    Step height-right    Step symmetry    Step continuity    Path deviation    Trunk movement    Walking stance        Annual Wellness Visit Requirements Recorded Today In  Medical, family, social history Past Medical, Family, Social History Section  Current providers Care team  Current medications Medications  Wt, BP, Ht, BMI Vital signs  Hearing assessment (welcome visit) Progress note  Tobacco, alcohol, illicit drug use History  ADL Nurse Assessment  Depression Screening Nurse Assessment  Cognitive impairment Nurse Assessment  Mini Mental Status Document Flowsheet  Fall Risk Nurse Assessment  Home Safety Progress Note  End of Life Planning (welcome visit) Social Documentation  Medicare preventative services Progress Note  Risk factors/conditions needing evaluation/treatment Progress Note  Personalized health  advice Patient Instructions, goals, letter  Diet & Exercise Social Documentation  Emergency Contact Social Documentation  Seat Belts Social Documentation  Sun exposure/protection Social Documentation    Prevention Plan:   Recommended Medicare Prevention Screenings Women over 65 Test For Frequency Date of Last- BOLD if needed  Breast Cancer 1-2 yrs 9/13  Cervical Cancer 1-3 yrs 3/12  Colorectal Cancer 1-10 yrs 3/12  Osteoporosis once 10/09  Cholesterol 5 yrs 9/13  Diabetes yearly 8/13  HIV yearly declined  Influenza Shot yearly 9/13  Pneumonia Shot once NI due to age  Zostavax Shot once discussed

## 2012-05-28 ENCOUNTER — Other Ambulatory Visit: Payer: Self-pay | Admitting: *Deleted

## 2012-05-28 ENCOUNTER — Encounter: Payer: Self-pay | Admitting: Family Medicine

## 2012-05-28 ENCOUNTER — Telehealth: Payer: Self-pay | Admitting: Family Medicine

## 2012-05-28 DIAGNOSIS — R339 Retention of urine, unspecified: Secondary | ICD-10-CM | POA: Insufficient documentation

## 2012-05-28 DIAGNOSIS — I10 Essential (primary) hypertension: Secondary | ICD-10-CM

## 2012-05-28 DIAGNOSIS — Z780 Asymptomatic menopausal state: Secondary | ICD-10-CM

## 2012-05-28 DIAGNOSIS — N281 Cyst of kidney, acquired: Secondary | ICD-10-CM | POA: Insufficient documentation

## 2012-05-28 NOTE — Telephone Encounter (Signed)
Returned call to patient.  Patient states she has been on Lasix "for years" and noticed it is no longer on her medication list.  Had office visit with Dr. Jennette Kettle and Dr. Tenny Craw this month and does not recall Lasix being discontinued.  Patient has been taking Lasix 20 mg daily.  Will check with Dr. Jennette Kettle to see if patient should still be on Lasix.  Note routed to Dr. Jennette Kettle.  Will call patient back.  Gaylene Brooks, RN

## 2012-05-28 NOTE — Telephone Encounter (Signed)
Pt thought she was supposed to be on Lasix and it is no longer on her meds list - wants to know if she is not supposed to be taking it.  pls advise

## 2012-05-29 MED ORDER — FUROSEMIDE 20 MG PO TABS: 20.0000 mg | ORAL_TABLET | Freq: Every day | ORAL | Status: DC

## 2012-05-29 NOTE — Telephone Encounter (Signed)
RX sent to pharmacy for Lasix 20 mg daily.

## 2012-05-29 NOTE — Telephone Encounter (Signed)
Dear [facility] Asters Team YES she should be on lasix (furosemide 20 mg ) once a day. I do not know why it  Larey Seat off her list  I HAVE A QUESTION It looks like she is filling BOTH her atorvastatin (lipitor) and simvastatin (ZOCOR). She should ONLY BE ON ONE OF THEM. She can take either one, once a day, and use them both up but NOT BOTH.  Tell her I am glad SHE is paying attention! THANKS! Denny Levy

## 2012-05-29 NOTE — Telephone Encounter (Signed)
Patient just got off of the phone with [facility] and they have not received the Rx for her Lasix.  She would like it to be resent.

## 2012-05-29 NOTE — Progress Notes (Signed)
Patient ID: [name], female   DOB: 12-01-1950, 61 y.o.   MRN: [id number] I have reviewed this visit and discussed with Arlys John and agree with her documentation. Denny Levy

## 2012-06-03 ENCOUNTER — Telehealth: Payer: Self-pay | Admitting: Family Medicine

## 2012-06-03 ENCOUNTER — Ambulatory Visit (INDEPENDENT_AMBULATORY_CARE_PROVIDER_SITE_OTHER): Payer: Medicare Other | Admitting: Family Medicine

## 2012-06-03 ENCOUNTER — Encounter: Payer: Self-pay | Admitting: Family Medicine

## 2012-06-03 ENCOUNTER — Ambulatory Visit (HOSPITAL_COMMUNITY)
Admission: RE | Admit: 2012-06-03 | Discharge: 2012-06-03 | Disposition: A | Discharge status: Home / Self Care | Payer: Medicare Other | Source: Ambulatory Visit | Attending: Family Medicine | Admitting: Family Medicine

## 2012-06-03 VITALS — BP 140/80 | HR 74 | Temp 98.2°F | Wt 186.0 lb

## 2012-06-03 DIAGNOSIS — M4322 Fusion of spine, cervical region: Secondary | ICD-10-CM | POA: Insufficient documentation

## 2012-06-03 DIAGNOSIS — IMO0002 Reserved for concepts with insufficient information to code with codable children: Secondary | ICD-10-CM

## 2012-06-03 DIAGNOSIS — M1710 Unilateral primary osteoarthritis, unspecified knee: Secondary | ICD-10-CM

## 2012-06-03 DIAGNOSIS — G609 Hereditary and idiopathic neuropathy, unspecified: Secondary | ICD-10-CM

## 2012-06-03 DIAGNOSIS — M542 Cervicalgia: Secondary | ICD-10-CM | POA: Insufficient documentation

## 2012-06-03 LAB — GLUCOSE, CAPILLARY: Glucose-Capillary: 74 mg/dL (ref 70–99)

## 2012-06-03 NOTE — Telephone Encounter (Signed)
Called pt and informed pt of what Dr. Jennette Kettle observed from her xrays and told her that should her pain gets WORSE IN ANYWAY Dr. Jennette Kettle NEEDS TO KNOW IMMEDIATELY!!! Pt voiced understanding and agreed.[name] [name]

## 2012-06-03 NOTE — Telephone Encounter (Signed)
Dear Cl[facility] Asters Team Plz tell [name] [name] that the hardware in her neck looks intact. There IS a screw that sits at a bit of an angle, ---it always has--I doubt that is what is causing her problems cause it is not CHANGED. BUT, I will send a mssg to Dr Yetta Barre and see if he can look at the flms tomorrow and give me his opinion. If her neck painis getting WORSE in any way--I NEED TO KNOW THANKS! Denny Levy

## 2012-06-05 NOTE — Progress Notes (Signed)
  Subjective:    Patient ID: [name] [name], female    DOB: [birth year], 61 y.o.   MRN: [id number]  HPI  #1. Neck pain. Mostly left-sided. Bothers her a lot at night. Feels like an aching pain. Has difficulty finding a comfortable position. No new radiation of pain into the arm, no change in her baseline bilateral hand numbness. Has had some imbalance a few times in the last several days, she thinks this is occurring secondary to acute episode of neck pain when she turns her head a certain way.  #2. Continued right knee pain. Injection did not help at all area #3. Continues to have peripheral neuropathy. The right thigh is the newest area to have burning sensation. Similar to the sensation she has in bilateral feet and hands without the numbness.  Review of Systems Denies unusual weight change, fever, sweats, chills. Please see history of present illness above for partner review of systems    Objective:   Physical Exam  Vital signs are reviewed GENERAL: Well-developed female using a walker. No acute distress. NECK: Nontender to palpation or percussion of the cervical vertebra. She has somewhat limited flexion extension and lateral rotation secondary to her fusion. No pain with movement. I did not attempt to Spurling's.  EXTREMITY:Upper extremity strength is 5 out of 5 in all planes the rotator cuff. Her grip strength on the left is 3-4/5 and on the right is 5 out of 5. Notably this is significantly improved from prior to her surgery where her grip strength on the left was 2/5. KNEE: Right knee tender to the medial joint line with palpation. No effusion. No warmth or erythema. Full range of motion in flexion extension.      Assessment & Plan:  #1. Neck pain. This is actually the first time I've ever her to complain of neck pain. She has followup with the neurosurgeon next month but I think it would be prudent to go ahead and get C-spine films to make sure her hardware has not shifted or  loosened or broken. We'll set that up for today. #2. Knee pain. I think for offer joint pains I would urge her to go back in retry the hydrocodone. She's been using the tramadol with some relief but I think she might get more benefit from the hydrocodone. She has some at home. I cautioned her not to mix the 2, that is hydrocodone and tramadol. She'll let me know next week how this is working for her. #3. Peripheral neuropathy. The area of the thigh it seems to bother her. She is on fairly high as in max dose of Lyrica. I don't know what else to and. I'll see her back in 4-6 weeks and certainly sooner with new or worsening symptoms #4. Also related to her neck pain I will give her prescription for cervical neck support. I think they can get her a pillow through home health that would give her some nighttime relief

## 2012-06-16 ENCOUNTER — Encounter: Payer: Self-pay | Admitting: Family Medicine

## 2012-06-16 ENCOUNTER — Ambulatory Visit (INDEPENDENT_AMBULATORY_CARE_PROVIDER_SITE_OTHER): Payer: Medicare Other | Admitting: Family Medicine

## 2012-06-16 ENCOUNTER — Ambulatory Visit
Admission: RE | Admit: 2012-06-16 | Discharge: 2012-06-16 | Disposition: A | Discharge status: Home / Self Care | Payer: Medicare Other | Source: Ambulatory Visit | Attending: Family Medicine | Admitting: Family Medicine

## 2012-06-16 ENCOUNTER — Observation Stay (HOSPITAL_COMMUNITY)
Admission: AD | Admit: 2012-06-16 | Discharge: 2012-06-17 | Disposition: A | Discharge status: Home / Self Care | Payer: Medicare Other | Source: Ambulatory Visit | Attending: Family Medicine | Admitting: Family Medicine

## 2012-06-16 ENCOUNTER — Telehealth: Payer: Self-pay | Admitting: Family Medicine

## 2012-06-16 ENCOUNTER — Encounter (HOSPITAL_COMMUNITY): Payer: Self-pay | Admitting: General Practice

## 2012-06-16 VITALS — BP 194/88 | Temp 97.8°F | Wt 187.0 lb

## 2012-06-16 DIAGNOSIS — Z981 Arthrodesis status: Secondary | ICD-10-CM | POA: Insufficient documentation

## 2012-06-16 DIAGNOSIS — E785 Hyperlipidemia, unspecified: Secondary | ICD-10-CM | POA: Diagnosis present

## 2012-06-16 DIAGNOSIS — R42 Dizziness and giddiness: Secondary | ICD-10-CM | POA: Insufficient documentation

## 2012-06-16 DIAGNOSIS — Z780 Asymptomatic menopausal state: Secondary | ICD-10-CM

## 2012-06-16 DIAGNOSIS — K219 Gastro-esophageal reflux disease without esophagitis: Secondary | ICD-10-CM | POA: Insufficient documentation

## 2012-06-16 DIAGNOSIS — M4322 Fusion of spine, cervical region: Secondary | ICD-10-CM

## 2012-06-16 DIAGNOSIS — R55 Syncope and collapse: Secondary | ICD-10-CM

## 2012-06-16 DIAGNOSIS — I10 Essential (primary) hypertension: Principal | ICD-10-CM | POA: Insufficient documentation

## 2012-06-16 DIAGNOSIS — M542 Cervicalgia: Secondary | ICD-10-CM

## 2012-06-16 DIAGNOSIS — I160 Hypertensive urgency: Secondary | ICD-10-CM | POA: Diagnosis present

## 2012-06-16 DIAGNOSIS — E119 Type 2 diabetes mellitus without complications: Secondary | ICD-10-CM | POA: Insufficient documentation

## 2012-06-16 DIAGNOSIS — Z23 Encounter for immunization: Secondary | ICD-10-CM | POA: Insufficient documentation

## 2012-06-16 DIAGNOSIS — G4459 Other complicated headache syndrome: Secondary | ICD-10-CM

## 2012-06-16 DIAGNOSIS — R51 Headache: Secondary | ICD-10-CM | POA: Insufficient documentation

## 2012-06-16 DIAGNOSIS — E1149 Type 2 diabetes mellitus with other diabetic neurological complication: Secondary | ICD-10-CM | POA: Diagnosis present

## 2012-06-16 HISTORY — DX: Syncope and collapse: R55

## 2012-06-16 LAB — GLUCOSE, CAPILLARY: Glucose-Capillary: 167 mg/dL — ABNORMAL HIGH (ref 70–99)

## 2012-06-16 MED ORDER — PREGABALIN 75 MG PO CAPS
200.0000 mg | ORAL_CAPSULE | Freq: Two times a day (BID) | ORAL | Status: DC
  Administered 2012-06-16 – 2012-06-17 (×2): 200 mg via ORAL
  Filled 2012-06-16: qty 2
  Filled 2012-06-16: qty 4

## 2012-06-16 MED ORDER — LATANOPROST 0.005 % OP SOLN
1.0000 [drp] | Freq: Every day | OPHTHALMIC | Status: DC
  Administered 2012-06-16: 1 [drp] via OPHTHALMIC
  Filled 2012-06-16: qty 2.5

## 2012-06-16 MED ORDER — ACETAMINOPHEN 325 MG PO TABS: 650.0000 mg | ORAL_TABLET | Freq: Four times a day (QID) | ORAL | Status: DC | PRN

## 2012-06-16 MED ORDER — PANTOPRAZOLE SODIUM 40 MG PO TBEC
40.0000 mg | DELAYED_RELEASE_TABLET | Freq: Every day | ORAL | Status: DC
  Administered 2012-06-17: 40 mg via ORAL
  Filled 2012-06-16: qty 1

## 2012-06-16 MED ORDER — MONTELUKAST SODIUM 10 MG PO TABS
10.0000 mg | ORAL_TABLET | Freq: Every day | ORAL | Status: DC
  Administered 2012-06-17: 10 mg via ORAL
  Filled 2012-06-16: qty 1

## 2012-06-16 MED ORDER — ACETAMINOPHEN 650 MG RE SUPP: 650.0000 mg | Freq: Four times a day (QID) | RECTAL | Status: DC | PRN

## 2012-06-16 MED ORDER — ATORVASTATIN CALCIUM 20 MG PO TABS
20.0000 mg | ORAL_TABLET | Freq: Every day | ORAL | Status: DC
Start: 2012-06-17 — End: 2012-06-17
  Administered 2012-06-17: 20 mg via ORAL
  Filled 2012-06-16: qty 1

## 2012-06-16 MED ORDER — METOPROLOL TARTRATE 50 MG PO TABS
50.0000 mg | ORAL_TABLET | Freq: Two times a day (BID) | ORAL | Status: DC
  Administered 2012-06-16 – 2012-06-17 (×2): 50 mg via ORAL
  Filled 2012-06-16 (×3): qty 1

## 2012-06-16 MED ORDER — INSULIN GLARGINE 100 UNIT/ML ~~LOC~~ SOLN
10.0000 [IU] | Freq: Every day | SUBCUTANEOUS | Status: DC
  Administered 2012-06-16: 10 [IU] via SUBCUTANEOUS

## 2012-06-16 MED ORDER — LOSARTAN POTASSIUM 50 MG PO TABS
100.0000 mg | ORAL_TABLET | Freq: Every day | ORAL | Status: DC
  Administered 2012-06-17: 100 mg via ORAL
  Filled 2012-06-16: qty 2

## 2012-06-16 MED ORDER — BRIMONIDINE TARTRATE 0.2 % OP SOLN
1.0000 [drp] | Freq: Two times a day (BID) | OPHTHALMIC | Status: DC
  Administered 2012-06-16 – 2012-06-17 (×2): 1 [drp] via OPHTHALMIC
  Filled 2012-06-16: qty 5

## 2012-06-16 MED ORDER — HYDROCODONE-ACETAMINOPHEN 5-325 MG PO TABS
1.0000 | ORAL_TABLET | Freq: Four times a day (QID) | ORAL | Status: DC | PRN
  Administered 2012-06-16 – 2012-06-17 (×2): 1 via ORAL
  Filled 2012-06-16 (×2): qty 1

## 2012-06-16 MED ORDER — ALLOPURINOL 300 MG PO TABS
300.0000 mg | ORAL_TABLET | Freq: Every day | ORAL | Status: DC
  Administered 2012-06-17: 300 mg via ORAL
  Filled 2012-06-16: qty 1

## 2012-06-16 MED ORDER — PREGABALIN 75 MG PO CAPS: 200.0000 mg | ORAL_CAPSULE | Freq: Two times a day (BID) | ORAL | Status: DC

## 2012-06-16 MED ORDER — PNEUMOCOCCAL VAC POLYVALENT 25 MCG/0.5ML IJ INJ
0.5000 mL | INJECTION | INTRAMUSCULAR | Status: AC
  Administered 2012-06-17: 0.5 mL via INTRAMUSCULAR
  Filled 2012-06-16: qty 0.5

## 2012-06-16 MED ORDER — HYDROCHLOROTHIAZIDE 12.5 MG PO CAPS
12.5000 mg | ORAL_CAPSULE | Freq: Every day | ORAL | Status: DC
  Administered 2012-06-17: 12.5 mg via ORAL
  Filled 2012-06-16: qty 1

## 2012-06-16 MED ORDER — SERTRALINE HCL 50 MG PO TABS
150.0000 mg | ORAL_TABLET | Freq: Every day | ORAL | Status: DC
  Administered 2012-06-16: 150 mg via ORAL
  Filled 2012-06-16 (×2): qty 1

## 2012-06-16 MED ORDER — BRIMONIDINE TARTRATE 0.1 % OP SOLN: 1.0000 [drp] | Freq: Two times a day (BID) | OPHTHALMIC | Status: DC

## 2012-06-16 MED ORDER — ASPIRIN EC 81 MG PO TBEC
81.0000 mg | DELAYED_RELEASE_TABLET | Freq: Every day | ORAL | Status: DC
  Administered 2012-06-17: 81 mg via ORAL
  Filled 2012-06-16: qty 1

## 2012-06-16 MED ORDER — INSULIN ASPART 100 UNIT/ML ~~LOC~~ SOLN
0.0000 [IU] | Freq: Three times a day (TID) | SUBCUTANEOUS | Status: DC
  Administered 2012-06-17: 2 [IU] via SUBCUTANEOUS

## 2012-06-16 NOTE — Progress Notes (Signed)
  Subjective:    Patient ID: [name] [name], female    DOB: [birth year], 61 y.o.   MRN: [id number]  HPI  Patient was at radiology getting her DEXA scan when she became lightheaded and almost passed out. They checked her blood pressure and it was quite elevated. Radiology called over to the family medicine clinic and she was transported here for further evaluation. Her blood pressure was elevated here as well. She was also complaining of a 7-8/10 headache. Admits she did not eat breakfast or take her blood pressure medicines this morning. Headache started last night. She has been having worsening headaches for the last month or so and we had done a repeat x-ray of her neck fusion recently. She has followup next month with Dr. Yetta Barre who is her neurosurgeon.  Headache starts in the occipital area and radiates over the top of the head into the 4 head. She also has right-sided neck pain. Nothing seems to make this worse or better.  Review of Systems Positive for lightheadedness intermittently and especially this morning as per history of present illness. Denies nausea, chest pain, shortness of breath, visual changes. Has felt somewhat cold today but has not had chills or riders or fever. No nausea or vomiting.    Objective:   Physical Exam  Vital signs are reviewed GENERAL: Well-developed female no acute distress. NECK: Nontender to palpation. I can feel no muscle spasm. There is no carotid bruit on either side and no thyromegaly. CARDIOVASCULAR: Regular rate and rhythm no murmur gallop or rub LUNGS: Clear to auscultation bilaterally NEURO: Resting nystagmus his baseline and she is legally blind. Peripheral neuropathy in hands and feet to soft touch and proprioception is also at her baseline compared to previous exams. She is able to stand with use of her walker and can amble along with a walker although she's not terribly steady. This is unchanged. PSYCHIATRIC: Alert and oriented x4. Asks and answers  questions appropriately. Mentating normally with normal recent and remote memory recall.      Assessment & Plan:  #1. Hypertension and headache. I don't really think this is hypertensive urgency. I think her headache is more likely related to whatever is going on over the last month probably related to her recent cervical fusion. We had looked at C-spine films and did not see any loose hardware. I gave her 30 mg of IM Toradol in the office and that made no difference. We also gave her some food and let her rest in a dark room for a while and she still had some dizziness upon standing consider headache was 7-8/10. She lives alone except for her son who occasionally stays there, she rides the bus, and she is legally blind so I don't feel comfortable giving her anything stronger for headache. I discussed options with her and she agreed to overnight observation the hospital. While she is there perhaps we could get her surgeon Dr. Yetta Barre of neurosurgery to take a look at her x-rays and make sure he agrees there is nothing urgent care. I have discussed with Dr. Durene Cal of the inpatient team, and we will give her blood pressure medicine since her pain medicines and see if we can get her more comfortable. She has agreed to admission for observation with the qualification that she doesn't want to stay more than one day.

## 2012-06-16 NOTE — Telephone Encounter (Signed)
[facility] after having a [facility]xa [facility]. States when she got up she felt dizzy. BP was taken and was 195/118. Was taken again later 185/120.. States she has had headache recently.  Dizziness today. Has not taken medications yet today. Usually takes between 11:00 and 12:00. [facility] to [facility] .  [facility] was called and they will pick patient up and bring her to our office. Will schedule work in appointment .

## 2012-06-16 NOTE — Telephone Encounter (Signed)
Patient is concerned in fluctuations in her BP and wants to speak to the nurse to see if this can wait until her appt tomorrow.

## 2012-06-16 NOTE — H&P (Signed)
Family Medicine Teaching [facility] Admission History and Physical Service Pager: [phone]  Patient name: [name] [name] Medical record number: [id number] Date of birth: 1950-12-26 Age: 61 y.o. Gender: female  Primary Care Provider: Denny Levy, MD  Chief Complaint: dizziness, hypertensive urgency.   Assessment and Plan: [name] [name] is a 61 y.o. year old female presenting with Hypertensive Urgency. 1. Hypertensive Urgency - Will continue home meds as pt did not take her medications this AM 1. Vitals, Telemetery 2. HCTZ, Cozaar, Lopressor home meds 3. Will hold Furosemide due to possible orthostatic hypotension  4. Repeat EKG in AM  2. Dizziness - Concern for orthostatic hypotension vs. Hypoglycemia.  1. Vitals q protocol 2. Will get orthostatics now and repeat in AM.  3. Telemetry overnight 4. Pt did not eat breakfast before DEXA scan and most likely was hypoglycemic.  Also has had decrease PO intake so orthostatic hypotension high possibility.  3. DM II - Last A1C 6.5 1. Will repeat A1C 2. SSI and Lantus 10 U (Home dose 40 U) 3. Follow BMP and CBG's 4. Headaches - Describes Sx similar to migraine/tension HA combination.  Received Toradol in clinic without much relief 1. Will Continue home Norco for pain 2. Will consult neurosurgery, Dr. Yetta Barre, to re-examine her C-spine films s/p cervical fusion back in May. 3. Consider Toradol IV as well if pt continues to have Sx 5. FEN/GI: Protonix, Diabetic Diet 6. Prophylaxis: SCD 7. Disposition: Home tomorrow pending evaluation of vitals/neuro recs 8. Code Status: Full   History of Present Illness:  Pt is a 61 y/o female who was sent over from clinic due to hypertensive urgency and near syncopal episode while getting a DEXA scan. After pt tried to stand from getting up from the DEXA, she started to feel lightheaded.  They checked her blood pressure and it was quite elevated. Radiology called over to the family medicine clinic and  she was transported here for further evaluation. Her blood pressure was elevated here as well. She was also complaining of a 7-8/10 headache. She does admits that she did not eat breakfast or take her blood pressure medicines this morning.   Pt has had worsening HA for the past month.  She had a previous C-spine fusion and repeat C-spine films were ordered last month.   She has followup next month with Dr. Yetta Barre who is her neurosurgeon.  Pt states that her headache starts in the occipital area and radiates over the top of the head into the forehead along with R sided neck pain.  Pt states she does not seem to get relief from much.   In clinic, pt was given a shot of Toradol and placed in a dark room for 30 minutes.  She did not get much relief with this.   Review of Systems  Positive for lightheadedness intermittently and especially this morning as per history of present illness. Denies nausea, chest pain, shortness of breath, visual changes. Has felt somewhat cold today but has not had chills or riders or fever. No nausea or vomiting.    Patient Active Problem List  Diagnosis  . HERPES SIMPLEX INFECTION, RECURRENT  . DIABETES MELLITUS II, UNCOMPLICATED  . HYPERLIPIDEMIA-MIXED  . DEPRESSION, MAJOR, RECURRENT  . OBSTRUCTIVE SLEEP APNEA  . CARPAL TUNNEL SYNDROME, BILATERAL  . PERIPHERAL NEUROPATHY  . GLAUCOMA  . NEURAL HEARING LOSS BILATERAL  . HYPERTENSION, BENIGN SYSTEMIC  . PARALYSIS, VOCAL CORD, UNILATERAL, TOTAL  . GERD  . GAIT IMBALANCE  . Cataract  extraction status  . Allergic rhinitis due to pollen  . DJD (degenerative joint disease) of knee  . Hip arthritis  . Nail dystrophy  . Vision loss, bilateral  . Abdominal pain, chronic, generalized  . Acquired cyst of kidney  . Incomplete bladder emptying  . Fusion of spine of cervical region   Past Medical History: Past Medical History  Diagnosis Date  . Chronic cough     Now followed by pulmonary  . Sleep apnea     on CPAP  12  . HTN (hypertension)   . Dyslipidemia   . Gout   . History of colonoscopy   . Concussion 11/18/11    "fell at dr's office; hit head"  . Complication of anesthesia     "just wake up coughing; that's all"  . Heart murmur   . Asthma   . COPD (chronic obstructive pulmonary disease)   . Diabetes mellitus type II     "take insulin & pills"  . Blood transfusion   . GERD (gastroesophageal reflux disease)   . Arthritis   . Chronic lower back pain   . Vocal cord paralysis, unilateral partial     "right"  . Depression   . Peripheral neuropathy   . Angina   . Pneumonia     "i've had it once" (11/18/11)  . Exertional dyspnea   . Headache 11/18/11    "I've had mild headaches the last couple days"  . Headache 01/08/12    "pretty constant since 11/18/11's concussion"  . Syncope 06/16/2012   Past Surgical History: Past Surgical History  Procedure Date  . Colonoscopy w/ biopsies 11/21/2010    adenoma polyp--no hi grade dysplasia Dr [address]  . Treatment fistula anal   . Breast reduction surgery 04-21-2003  . Tubal ligation 1987  . Cardiac catheterization 03-02-2004  . Bronchoscopy 07-2007  . Knee arthroscopy 08/2000    right  . Shoulder arthroscopy 08/2000    right  . T-score 09/02/04    -0.54 (low risk currently)  . Total abdominal hysterectomy 10-07-2000  . Cesarean section 1987  . Shoulder arthroscopy w/ rotator cuff repair 10/18/2003    left  . Cataract extraction 1955; 1979    bilateral; right eye  . Eye surgery   . Breast biopsy     bilaterally  . Breast cyst excision     right twice; left once  . Carpal tunnel release     left  . Anterior cervical decomp/discectomy fusion 01/15/2012    Procedure: ANTERIOR CERVICAL DECOMPRESSION/DISCECTOMY FUSION 3 LEVELS;  Surgeon: Tia Alert, MD;  Location: [facility];  Service: Neurosurgery;  Laterality: N/A;  Anterior Cervical Decompression Discectomy Fusion Cerivcal three four, cervical four five, cervical five six, cervical six seven    Social History: History  Substance Use Topics  . Smoking status: Former Smoker -- 0.5 packs/day for .5 years    Types: Cigarettes    Quit date: 09/03/1975  . Smokeless tobacco: Never Used  . Alcohol Use: No   For any additional social history documentation, please refer to relevant sections of EMR.  Family History: Family History  Problem Relation Age of Onset  . Diabetes Mother   . Colon cancer Neg Hx    Allergies: Allergies  Allergen Reactions  . Amlodipine Besylate Swelling  . Lisinopril-Hydrochlorothiazide Cough    REACTION: cough-- changed to arb  . Propoxyphene Hcl Itching  . Valsartan Other (See Comments)    REACTION:  "sleep more the next day after I  took it; it made me real tired"  . Metronidazole     REACTION: "just didn't work; my body never did heal from it"   No current facility-administered medications on file prior to encounter.   Current Outpatient Prescriptions on File Prior to Encounter  Medication Sig Dispense Refill  . acyclovir (ZOVIRAX) 400 MG tablet Take 400 mg by mouth 2 (two) times daily.      [name] albuterol (PROVENTIL HFA;VENTOLIN HFA) 108 (90 BASE) MCG/ACT inhaler Inhale 2 puffs into the lungs every 4 (four) hours as needed. For shortness of breath      . allopurinol (ZYLOPRIM) 300 MG tablet Take 300 mg by mouth daily.      [name] arformoterol (BROVANA) 15 MCG/2ML NEBU Take 15 mcg by nebulization 2 (two) times daily as needed. For shortness of breath      . aspirin EC 81 MG tablet Take 81 mg by mouth daily.       [name] atorvastatin (LIPITOR) 20 MG tablet Take 20 mg by mouth daily.       . brimonidine (ALPHAGAN P) 0.1 % SOLN Place 1 drop into the left eye 2 (two) times daily.        [name] docusate sodium (COLACE) 100 MG capsule Take 100 mg by mouth 2 (two) times daily as needed. For constipation      . esomeprazole (NEXIUM) 40 MG capsule Take 40 mg by mouth 2 (two) times daily.      . furosemide (LASIX) 20 MG tablet Take 1 tablet (20 mg total) by mouth daily.   30 tablet  3  . hydrochlorothiazide (MICROZIDE) 12.5 MG capsule Take 1 capsule (12.5 mg total) by mouth daily.  30 capsule  4  . HYDROcodone-acetaminophen (NORCO/VICODIN) 5-325 MG per tablet       . insulin glargine (LANTUS) 100 UNIT/ML injection Inject 40 Units into the skin at bedtime.  10 mL  5  . latanoprost (XALATAN) 0.005 % ophthalmic solution Place 1 drop into both eyes at bedtime.      [name] losartan (COZAAR) 100 MG tablet Take 1 tablet (100 mg total) by mouth daily.  30 tablet  4  . metFORMIN (GLUCOPHAGE) 1000 MG tablet Take 1,000-1,500 mg by mouth 2 (two) times daily with a meal. Take 1.5 tablet in the morning and 1 tablet in the evening      . metoprolol (LOPRESSOR) 50 MG tablet Take 1 tablet (50 mg total) by mouth 2 (two) times daily.  1 tablet  0  . montelukast (SINGULAIR) 10 MG tablet Take 10 mg by mouth daily.      . nitroGLYCERIN (NITROSTAT) 0.4 MG SL tablet Place 0.4 mg under the tongue every 5 (five) minutes as needed. For chest pain      . polyethylene glycol (MIRALAX / GLYCOLAX) packet Take 17 g by mouth daily as needed. For constipation      . pregabalin (LYRICA) 200 MG capsule Take 1 capsule (200 mg total) by mouth 2 (two) times daily.  60 capsule  12  . sertraline (ZOLOFT) 100 MG tablet Take 150 mg by mouth at bedtime.      . traMADol (ULTRAM) 50 MG tablet Take 1-2 tablets (50-100 mg total) by mouth every 8 (eight) hours as needed. For joint pain.  30 tablet  2  . Vitamin D, Ergocalciferol, (DRISDOL) 50000 UNITS CAPS Take 50,000 Units by mouth every 7 (seven) days. On Sunday.       Review Of Systems: Per HPI   Physical Exam:  BP 180/73  Pulse 65  Temp 98.1 F (36.7 C) (Oral)  Resp 20  Ht 5' (1.524 m)  SpO2 100% Exam: General: WN/WD, NAD HEENT: Nystagmus, + episclerotic cyst on the R upper sclera, PERRLA, [place]/AT Neck: No JVD, no TTP  Cardiovascular: RRR, no murmur gallop or rub Respiratory: CTA B/L Abdomen: TTP LLQ, NABS, Soft, non distended Extremities: No edema, + 2  pulses B/L  Skin: No rashes Neuro: AAO x 4, CN 2-12 intact.   Labs and Imaging: None ordered   Gildardo Cranker, DO 06/16/2012, 7:15 PM  PGY-2 addendum I have seen and examined Ms. Willhite. I have discussed assessment and plan with Dr. Paulina Fusi and agree with the above documentation.  D. [place] Rolene Arbour, MD Family Medicine  PGY-2

## 2012-06-17 ENCOUNTER — Ambulatory Visit: Payer: Medicare Other | Admitting: Family Medicine

## 2012-06-17 DIAGNOSIS — I160 Hypertensive urgency: Secondary | ICD-10-CM | POA: Diagnosis present

## 2012-06-17 DIAGNOSIS — R42 Dizziness and giddiness: Secondary | ICD-10-CM | POA: Diagnosis present

## 2012-06-17 LAB — GLUCOSE, CAPILLARY
Glucose-Capillary: 160 mg/dL — ABNORMAL HIGH (ref 70–99)
Glucose-Capillary: 106 mg/dL — ABNORMAL HIGH (ref 70–99)

## 2012-06-17 LAB — BASIC METABOLIC PANEL
BUN: 13 mg/dL (ref 6–23)
CO2: 30 mEq/L (ref 19–32)
Calcium: 9.9 mg/dL (ref 8.4–10.5)
Chloride: 105 mEq/L (ref 96–112)
Creatinine, Ser: 0.62 mg/dL (ref 0.50–1.10)
GFR calc Af Amer: >90 mL/min (ref >90)
GFR calc non Af Amer: >90 mL/min (ref >90)
Glucose, Bld: 121 mg/dL — ABNORMAL HIGH (ref 70–99)
Potassium: 3.9 mEq/L (ref 3.5–5.1)
Sodium: 142 mEq/L (ref 135–145)

## 2012-06-17 LAB — CBC
HCT: 37.9 % (ref 36.0–46.0)
Hemoglobin: 12 g/dL (ref 12.0–15.0)
MCH: 26.8 pg (ref 26.0–34.0)
MCHC: 31.7 g/dL (ref 30.0–36.0)
MCV: 84.8 fL (ref 78.0–100.0)
Platelets: 212 10*3/uL (ref 150–400)
RBC: 4.47 MIL/uL (ref 3.87–5.11)
RDW: 15.2 % (ref 11.5–15.5)
WBC: 8.6 10*3/uL (ref 4.0–10.5)

## 2012-06-17 LAB — HEMOGLOBIN A1C
Hgb A1c MFr Bld: 6.3 % — ABNORMAL HIGH (ref <5.7)
Mean Plasma Glucose: 134 mg/dL — ABNORMAL HIGH (ref <117)

## 2012-06-17 NOTE — Evaluation (Signed)
Physical Therapy Evaluation and Discharge Patient Details Name: [name] [name] MRN: [id number] DOB: 05/05/1951 Today's Date: 06/17/2012 Time: 6578-4696 PT Time Calculation (min): 35 min  PT Assessment / Plan / Recommendation Clinical Impression  61 yo adm with urgency hypertension and severe headache that she has had off and on since September, nearly constant x 2 weeks. Pt clearly frustrated with pain and sleep interference and wants "someone to fix it." Pt attentive to PT instructions re: pillow use and ROM exercises, however question how compliant she will be (admits she stopped doing her neck exercises regularly after HHPT). Await Dr. Yetta Barre input. May benefit from course of OPPT for further education and musculoskeletal work.     PT Assessment  All further PT needs can be met in the next venue of care    Follow Up Recommendations  Outpatient PT;Other (comment) (If Dr. Yetta Barre feels pt will benefit, be compliant)    Does the patient have the potential to tolerate intense rehabilitation      Barriers to Discharge        Equipment Recommendations  None recommended by PT    Recommendations for Other Services     Frequency      Precautions / Restrictions     Pertinent Vitals/Pain Neck/headache pain 10/10; reports pain medicine is not alleviating       Mobility  Bed Mobility Bed Mobility: Not assessed Transfers Transfers: Not assessed Ambulation/Gait Ambulation/Gait Assistance: Not tested (comment)    Shoulder Instructions     Exercises Other Exercises Other Exercises: Instructed pt in cervical rotation stretches to be done 2-3 reps holding for 15-30 seconds, 2-3 times per day. Pt return demonstrated x1 rep each side.   PT Diagnosis: Acute pain  PT Problem List: Decreased range of motion;Pain PT Treatment Interventions:     PT Goals    Visit Information  Last PT Received On: 06/17/12 Assistance Needed: +1    Subjective Data  Subjective: Pt reports she began  having incr posterior neck/occiput pain and headaches in September. This has progressed to interfering with her sleep and constant headache for the past two weeks. Pt unable to identify any precipitating factor (denies injury, excessive lifting, changing pillow, etc).  Patient Stated Goal: Decr posterior neck and headache pain so she can sleep   Prior Functioning  Home Living Lives With: Alone Additional Comments: Home environment not pertinent to cervical eval Prior Function Level of Independence: Independent with assistive device(s) (uses rollator) Communication Communication: No difficulties    Cognition  Overall Cognitive Status: Appears within functional limits for tasks assessed/performed Arousal/Alertness: Awake/alert Orientation Level: Appears intact for tasks assessed Behavior During Session: [facility] for tasks performed    Extremity/Trunk Assessment Trunk Assessment Trunk Assessment: Other exceptions Trunk Exceptions: Pt reports pain is centered over occiput to C1-2 area and feels the pain radiates up the back of her head and causes her headache (mostly over Rt temporal area). No significant muscle tightness or trigger points noted in this area. Pt does have limited cervical rotation bil (~60 degrees) with tightness in her sternocleidomastoid muscles noted. Pt reports wore her cervical collar at least 6 weeks and had HHPT afterwards with ROM exercises. Reports she only does these exercises "sometimes" now. Reports pain is the worst when she lies down to sleep and discussed various types of pillows she can try with goal to fill in the curved space at her neck to decr pressure on the occiput/sore area. Pt reports the strap from her CPAP machince runs right  across this area (she has used this same strap for years) and also aggravates the area. Discussed trying to spend more time in sidelying, and she reports it is difficult to keep her CPAP mask on correctly in sidelying.    Balance    End of  Session PT - End of Session Activity Tolerance: Patient limited by pain Patient left: in chair;with call bell/phone within reach  GP Functional Assessment Tool Used: clinical judgement Functional Limitation: Changing and maintaining body position Changing and Maintaining Body Position Current Status (Z6109): At least 1 percent but less than 20 percent impaired, limited or restricted Changing and Maintaining Body Position Goal Status (U0454): At least 1 percent but less than 20 percent impaired, limited or restricted Changing and Maintaining Body Position Discharge Status [phone]): At least 1 percent but less than 20 percent impaired, limited or restricted   Anirudh Baiz 06/17/2012, 12:01 PM  Pager [phone]

## 2012-06-17 NOTE — Progress Notes (Signed)
Patient discharged to home.  Discharge teaching completed including follow up care and medications.  Patient verbalizes understanding with no further questions.  Discharged by wheelchair.  Vital signs stable, no complaints of pain.

## 2012-06-17 NOTE — Discharge Summary (Signed)
Family Medicine Teaching [facility] Discharge Summary  Patient name: [name] [name] Medical record number: [id number] Date of birth: 1950/09/07 Age: 61 y.o. Gender: female Date of Admission: 06/16/2012  Date of Discharge: 06/17/2012 Admitting Physician: Barbaraann Barthel, MD  Primary Care Provider: Denny Levy, MD  Indication for Hospitalization: hypertensive urgency, dizziness Discharge Diagnoses:  Hypertensive urgency Nonspecific dizziness HTN GERD HLD DM type II Hx of cervical spine fusion  Consultations: none (Dr. Yetta Barre, neurosurgery, contacted by phone)  Significant Labs and Imaging:   Lab 06/17/12 0640  WBC 8.6  HGB 12.0  HCT 37.9  PLT 212    Lab 06/17/12 0640  NA 142  K 3.9  CL 105  CO2 30  BUN 13  CREATININE 0.62  CALCIUM 9.9  PROT --  BILITOT --  ALKPHOS --  ALT --  AST --  GLUCOSE 121*   Hemoglobin A1c (10/16): 6.3  Procedures: none  Brief Hospital Course: [name] [name] is a 61 y.o. year old female presenting with Hypertensive Urgency and near-syncope/dizziness during a DEXA scan on 10/15; pt presented to clinic (PCP Dr. Jennette Kettle) and was direct-admitted. Pt was continued on her home meds and Dr. Yetta Barre was contacted to review pt's recent cervical xrays. At time of discharge, pt's dizziness and hypertension is improved and she wishes to home and feels well enough to be discharged. Please see below by problem list for details.  1. Hypertensive Urgency - Continued home meds (HCTZ, Cozaar, Lopressor) as pt did not take her medications 10/15 AM prior to DEXA scan. Furosemide was held due to concern over possible orthostasis. BP on admission 180/73, improved to 121/54 prior to discharge.  2. Dizziness - Original concern was for orthostatic hypotension vs hypoglycemia (pt did not eat prior to DEXA scan and had generally decreased PO intake). At time of discharge, dizziness had improved but pt stated "some" was still present now and then.     3. DM II - Last  A1C 6.5, rechecked at 6.3. Pt was covered with SSI and given Lantus 10 units overnight 10/15 (normally takes 40 units, reduced dose given due to decreased PO intake). CBG's ranged between 100-160 and pt was restarted on home regimen at discharge.  4. Headaches - Pt initially describes sx similar to migraine/tension HA combination. Received Toradol in clinic without much relief, prior to admission. Some minor relief with home Norco. Per Dr. Yetta Barre, spine films are not concerning for shifted/broke cervical fusion hardware. Pain appears to be related to muscle stiffness/tension from how pt holds neck when sleeping, due to reduced ROM from fusion surgery. PT was consulted, who recommended outpt PT who may be able to help with strengthening/flexibility exercises as well as a cervical pillow. Pt was offered Rx of Flexoril at time of discharge but declined, stating "it hasn't helped in the past.  Discharge Exam: BP 121/54  Pulse 68  Temp 98.1 F (36.7 C) (Oral)  Resp 16  Ht 5' (1.524 m)  Wt 187 lb (84.823 kg)  BMI 36.52 kg/m2  SpO2 100% General: elderly female in NAD, lying in bed HEENT: prominent nystagmus (baseline per chart review) ,episclerotic cyst on the R upper sclera, PERRLA, EOMI  Neck: No JVD, no particular TTP Cardiovascular: RRR, no murmur appreciated Respiratory: CTAB, no wheezes  Abdomen: soft, non distended, nontender, BS+ Extremities: No LE edema, swelling, or tenderness, + 2 pulses B/L  Neuro: AAO x 4  Discharge Medications:    Medication List     As of 06/17/2012  5:15 PM    TAKE these medications         acyclovir 400 MG tablet   Commonly known as: ZOVIRAX   Take 400 mg by mouth 2 (two) times daily.      albuterol 108 (90 BASE) MCG/ACT inhaler   Commonly known as: PROVENTIL HFA;VENTOLIN HFA   Inhale 2 puffs into the lungs every 4 (four) hours as needed. For shortness of breath      allopurinol 300 MG tablet   Commonly known as: ZYLOPRIM   Take 300 mg by mouth  daily.      arformoterol 15 MCG/2ML Nebu   Commonly known as: BROVANA   Take 15 mcg by nebulization 2 (two) times daily as needed. For shortness of breath      aspirin EC 81 MG tablet   Take 81 mg by mouth daily.      atorvastatin 20 MG tablet   Commonly known as: LIPITOR   Take 20 mg by mouth daily.      beclomethasone 80 MCG/ACT inhaler   Commonly known as: QVAR   Inhale 2 puffs into the lungs as needed. For allergies      brimonidine 0.1 % Soln   Commonly known as: ALPHAGAN P   Place 1 drop into the left eye 2 (two) times daily.      docusate sodium 100 MG capsule   Commonly known as: COLACE   Take 100 mg by mouth 2 (two) times daily as needed. For constipation      esomeprazole 40 MG capsule   Commonly known as: NEXIUM   Take 40 mg by mouth 2 (two) times daily.      furosemide 20 MG tablet   Commonly known as: LASIX   Take 1 tablet (20 mg total) by mouth daily.      hydrochlorothiazide 12.5 MG capsule   Commonly known as: MICROZIDE   Take 1 capsule (12.5 mg total) by mouth daily.      HYDROcodone-acetaminophen 5-325 MG per tablet   Commonly known as: NORCO/VICODIN      insulin glargine 100 UNIT/ML injection   Commonly known as: LANTUS   Inject 40 Units into the skin at bedtime.      latanoprost 0.005 % ophthalmic solution   Commonly known as: XALATAN   Place 1 drop into both eyes at bedtime.      losartan 100 MG tablet   Commonly known as: COZAAR   Take 1 tablet (100 mg total) by mouth daily.      metFORMIN 1000 MG tablet   Commonly known as: GLUCOPHAGE   Take 1,000-1,500 mg by mouth 2 (two) times daily with a meal. Take 1.5 tablet in the morning and 1 tablet in the evening      metoprolol 50 MG tablet   Commonly known as: LOPRESSOR   Take 1 tablet (50 mg total) by mouth 2 (two) times daily.      montelukast 10 MG tablet   Commonly known as: SINGULAIR   Take 10 mg by mouth daily.      nitroGLYCERIN 0.4 MG SL tablet   Commonly known as: NITROSTAT    Place 0.4 mg under the tongue every 5 (five) minutes as needed. For chest pain      polyethylene glycol packet   Commonly known as: MIRALAX / GLYCOLAX   Take 17 g by mouth daily as needed. For constipation      pregabalin 200 MG capsule   Commonly known as: LYRICA  Take 1 capsule (200 mg total) by mouth 2 (two) times daily.      sertraline 100 MG tablet   Commonly known as: ZOLOFT   Take 150 mg by mouth at bedtime.      traMADol 50 MG tablet   Commonly known as: ULTRAM   Take 1-2 tablets (50-100 mg total) by mouth every 8 (eight) hours as needed. For joint pain.      Vitamin D (Ergocalciferol) 50000 UNITS Caps   Commonly known as: DRISDOL   Take 50,000 Units by mouth every 7 (seven) days. On Sunday.        Issues for Follow Up:  1. Neck pain/headaches: s/p cervical fusion by Dr. Yetta Barre: Please address any further neck pain. Pt states not well-relieved by Norco (continued at discharge) and Flexoril did not help in the past (so was not Rx'd at discharge). PT did recommend outpt PT who may be able to help with special cervical pillow. Per Dr. Yetta Barre, recent cervical xrays look okay. 2. Dizziness: Please address any further concerns. Uncertain etiology, probably multifactorial. Possible contribution of uncontrolled HTN, orthostasis, and/or hypoglycemia (pt did not eat breakfast, drink, or take HTN medications before DEXA scan 10/15). BP improved prior to discharge on home medications. Pt advised to take medications as prescribed and ensure good fluid intake.  Outstanding Results: none  Discharge Instructions: Please refer to Patient Instructions section of EMR for full details.  Patient was counseled important signs and symptoms that should prompt return to medical care, changes in medications, dietary instructions, activity restrictions, and follow up appointments.    Follow-up Information    Follow up with Denny Levy, MD. On 06/24/2012. (Appt at 10:30 AM)    Contact information:    [address] [place] [address] [phone]         Discharge Condition: stable  [address] [place], MD 06/17/2012, 5:15 PM

## 2012-06-18 NOTE — Discharge Summary (Signed)
Patient seen and examined by me on the day of discharge; her acutely elevated blood pressures resolved during her stay.  The resident team contacted her neurosurgeon Dr Yetta Barre to review her cervical films from earlier this month; her headache does not appear to be related to malalignment of hardware. I agree with plan for discharge to home, as outlined in resident notes.  Paula Compton, MD

## 2012-06-22 NOTE — H&P (Signed)
FMTS Attending Admission Note: Cristopher Ciccarelli MD [phone] pager office [phone] I  have seen and examined this patient, reviewed their chart. I have discussed this patient with the resident. I agree with the resident's findings, assessment and care plan. 

## 2012-06-24 ENCOUNTER — Encounter: Payer: Self-pay | Admitting: Family Medicine

## 2012-06-24 ENCOUNTER — Ambulatory Visit (INDEPENDENT_AMBULATORY_CARE_PROVIDER_SITE_OTHER): Payer: Medicare Other | Admitting: Family Medicine

## 2012-06-24 ENCOUNTER — Other Ambulatory Visit: Payer: Self-pay | Admitting: Family Medicine

## 2012-06-24 VITALS — BP 150/72 | HR 86 | Temp 99.0°F | Wt 188.4 lb

## 2012-06-24 DIAGNOSIS — H543 Unqualified visual loss, both eyes: Secondary | ICD-10-CM

## 2012-06-24 DIAGNOSIS — R51 Headache: Secondary | ICD-10-CM

## 2012-06-24 DIAGNOSIS — R269 Unspecified abnormalities of gait and mobility: Secondary | ICD-10-CM

## 2012-06-24 DIAGNOSIS — M4322 Fusion of spine, cervical region: Secondary | ICD-10-CM

## 2012-06-24 DIAGNOSIS — Q7649 Other congenital malformations of spine, not associated with scoliosis: Secondary | ICD-10-CM

## 2012-06-24 DIAGNOSIS — H409 Unspecified glaucoma: Secondary | ICD-10-CM

## 2012-06-24 MED ORDER — OXYCODONE HCL 30 MG PO TABS: 30.0000 mg | ORAL_TABLET | ORAL | Status: DC | PRN

## 2012-06-24 MED ORDER — INSULIN GLARGINE 100 UNIT/ML ~~LOC~~ SOLN
SUBCUTANEOUS | Status: DC
Start: 2012-06-24 — End: 2013-11-22

## 2012-06-24 NOTE — Progress Notes (Signed)
Subjective:    Patient ID: [name] [name], female    DOB: 1950-10-15, 61 y.o.   MRN: [id number]  HPI  Continued problems with headaches. These have worsened over the last 2 months. They started when she had concussion back in the summer. At that time she also ruptured some discs in her neck and had to have fairly emergent surgery for his cervical fusion. After the surgery she had some headaches for a while then they improved. The surgery was in May, 2013 for her cervical fusion. In the last 2 months she has had increasing severity of headaches they're now 8-9/10 they her daily. She awakens with them but then they get gradually worse and last all day. She has very little that makes them any better. I have tried her on tramadol and Vicodin. Initially we thought maybe her blood pressure was being a problem but we have now gotten that under control and her headaches have not improved.  Review of Systems Denies any new visual symptoms although notably she is legally blind and has resting nystagmus. She does have some photophobia. She has had continued problems with balance.    Objective:   Physical Exam  Vital signs are reviewed GENERAL: Well-developed female, using a rolling walker. HEENT: Resting nystagmus. Nonicteric sclera. Neck is without lymphadenopathy. Limited range of motion in extension and flexion. Flexion and extension are nonpainful. The cervical vertebra are nontender to palpation. Some mild tenderness to palpation of the left sternocleidal mastoid muscle. No thyromegaly. No carotid bruits. CARDIOVASCULAR: Regular rate and rhythm LUNGS: Clear to auscultation NEURO: Blind, legally. Decreased sensation to soft touch bilaterally in her hands in a glove pattern with the left hand being severe in the right hand being moderate. Decreased sensation to soft touch from the knee down bilaterally. Unsteady standing with arms outstretched and eyes open. Her gait is wide-based and she is only stable  with a walker area      Assessment & Plan:  #1. Headaches. Difficult to understand where these are coming from. When she was recently admitted overnight I had the neurosurgeon look at her x-rays of her hardware and he thought there was no problem with that. I think at this point I'll give her some increased strength pain medication and refer her to the headache clinic. I will continue to follow her other problems.  Current Outpatient Prescriptions on File Prior to Visit  Medication Sig Dispense Refill  . acyclovir (ZOVIRAX) 400 MG tablet Take 400 mg by mouth 2 (two) times daily.      [name] albuterol (PROVENTIL HFA;VENTOLIN HFA) 108 (90 BASE) MCG/ACT inhaler Inhale 2 puffs into the lungs every 4 (four) hours as needed. For shortness of breath      . allopurinol (ZYLOPRIM) 300 MG tablet Take 300 mg by mouth daily.      [name] arformoterol (BROVANA) 15 MCG/2ML NEBU Take 15 mcg by nebulization 2 (two) times daily as needed. For shortness of breath      . aspirin EC 81 MG tablet Take 81 mg by mouth daily.       [name] atorvastatin (LIPITOR) 20 MG tablet Take 20 mg by mouth daily.       . beclomethasone (QVAR) 80 MCG/ACT inhaler Inhale 2 puffs into the lungs as needed. For allergies      . brimonidine (ALPHAGAN P) 0.1 % SOLN Place 1 drop into the left eye 2 (two) times daily.        [name] docusate sodium (COLACE) 100 MG  capsule Take 100 mg by mouth 2 (two) times daily as needed. For constipation      . esomeprazole (NEXIUM) 40 MG capsule Take 40 mg by mouth 2 (two) times daily.      . furosemide (LASIX) 20 MG tablet Take 1 tablet (20 mg total) by mouth daily.  30 tablet  3  . hydrochlorothiazide (MICROZIDE) 12.5 MG capsule Take 1 capsule (12.5 mg total) by mouth daily.  30 capsule  4  . insulin glargine (LANTUS) 100 UNIT/ML injection Inject 40 Units into the skin at bedtime.  10 mL  5  . latanoprost (XALATAN) 0.005 % ophthalmic solution Place 1 drop into both eyes at bedtime.      [name] losartan (COZAAR) 100 MG tablet  Take 1 tablet (100 mg total) by mouth daily.  30 tablet  4  . metFORMIN (GLUCOPHAGE) 1000 MG tablet Take 1,000-1,500 mg by mouth 2 (two) times daily with a meal. Take 1.5 tablet in the morning and 1 tablet in the evening      . metoprolol (LOPRESSOR) 50 MG tablet Take 1 tablet (50 mg total) by mouth 2 (two) times daily.  1 tablet  0  . montelukast (SINGULAIR) 10 MG tablet Take 10 mg by mouth daily.      . nitroGLYCERIN (NITROSTAT) 0.4 MG SL tablet Place 0.4 mg under the tongue every 5 (five) minutes as needed. For chest pain      . polyethylene glycol (MIRALAX / GLYCOLAX) packet Take 17 g by mouth daily as needed. For constipation      . pregabalin (LYRICA) 200 MG capsule Take 1 capsule (200 mg total) by mouth 2 (two) times daily.  60 capsule  12  . sertraline (ZOLOFT) 100 MG tablet Take 150 mg by mouth at bedtime.      . traMADol (ULTRAM) 50 MG tablet Take 1-2 tablets (50-100 mg total) by mouth every 8 (eight) hours as needed. For joint pain.  30 tablet  2  . Vitamin D, Ergocalciferol, (DRISDOL) 50000 UNITS CAPS Take 50,000 Units by mouth every 7 (seven) days. On Sunday.       Patient Active Problem List  Diagnosis  . HERPES SIMPLEX INFECTION, RECURRENT  . DIABETES MELLITUS II, UNCOMPLICATED  . HYPERLIPIDEMIA-MIXED  . DEPRESSION, MAJOR, RECURRENT  . OBSTRUCTIVE SLEEP APNEA  . CARPAL TUNNEL SYNDROME, BILATERAL  . PERIPHERAL NEUROPATHY  . GLAUCOMA  . NEURAL HEARING LOSS BILATERAL  . HYPERTENSION, BENIGN SYSTEMIC  . PARALYSIS, VOCAL CORD, UNILATERAL, TOTAL  . GERD  . GAIT IMBALANCE  . Cataract extraction status  . Allergic rhinitis due to pollen  . DJD (degenerative joint disease) of knee  . Hip arthritis  . Nail dystrophy  . Vision loss, bilateral  . Abdominal pain, chronic, generalized  . Acquired cyst of kidney  . Incomplete bladder emptying  . Fusion of spine of cervical region  . Dizziness, nonspecific  . Hypertensive urgency

## 2012-06-25 ENCOUNTER — Telehealth: Payer: Self-pay | Admitting: Family Medicine

## 2012-06-25 NOTE — Telephone Encounter (Signed)
Wants to know if she can take 1/2 of the oxycodone - makes her feel loopy

## 2012-06-26 ENCOUNTER — Other Ambulatory Visit: Payer: Self-pay | Admitting: Family Medicine

## 2012-06-26 DIAGNOSIS — M4322 Fusion of spine, cervical region: Secondary | ICD-10-CM

## 2012-06-26 DIAGNOSIS — R51 Headache: Secondary | ICD-10-CM

## 2012-06-26 DIAGNOSIS — R519 Headache, unspecified: Secondary | ICD-10-CM | POA: Insufficient documentation

## 2012-06-26 NOTE — Telephone Encounter (Signed)
Dear [facility] Asters Team Of course---she can take half and take only when she needs it [facility]! Denny Levy

## 2012-06-30 ENCOUNTER — Ambulatory Visit (INDEPENDENT_AMBULATORY_CARE_PROVIDER_SITE_OTHER): Payer: Medicare Other

## 2012-06-30 VITALS — BP 130/68 | HR 80 | Ht 61.0 in | Wt 191.0 lb

## 2012-06-30 DIAGNOSIS — I10 Essential (primary) hypertension: Secondary | ICD-10-CM

## 2012-06-30 NOTE — Telephone Encounter (Signed)
LMOM advising pt to take 1/2 of oxycodone and only when needed per Dr Jennette Kettle

## 2012-06-30 NOTE — Progress Notes (Signed)
Patient came to office for B/P check- 130/68.Advised to continue medications.

## 2012-07-22 ENCOUNTER — Ambulatory Visit (INDEPENDENT_AMBULATORY_CARE_PROVIDER_SITE_OTHER): Payer: Medicare Other | Admitting: Family Medicine

## 2012-07-22 VITALS — BP 141/66 | HR 80 | Temp 97.8°F | Wt 192.0 lb

## 2012-07-22 DIAGNOSIS — L608 Other nail disorders: Secondary | ICD-10-CM

## 2012-07-22 DIAGNOSIS — L603 Nail dystrophy: Secondary | ICD-10-CM

## 2012-07-22 DIAGNOSIS — R51 Headache: Secondary | ICD-10-CM

## 2012-07-22 DIAGNOSIS — R109 Unspecified abdominal pain: Secondary | ICD-10-CM

## 2012-07-23 ENCOUNTER — Other Ambulatory Visit (INDEPENDENT_AMBULATORY_CARE_PROVIDER_SITE_OTHER): Payer: Medicare Other

## 2012-07-23 DIAGNOSIS — E785 Hyperlipidemia, unspecified: Secondary | ICD-10-CM

## 2012-07-23 LAB — LIPID PANEL
Cholesterol: 130 mg/dL (ref 0–200)
HDL: 27.3 mg/dL — ABNORMAL LOW (ref >39.00)
Total CHOL/HDL Ratio: 5
Triglycerides: 217 mg/dL — ABNORMAL HIGH (ref 0.0–149.0)
VLDL: 43.4 mg/dL — ABNORMAL HIGH (ref 0.0–40.0)

## 2012-07-23 LAB — AST: AST: 31 U/L (ref 0–37)

## 2012-07-23 LAB — LDL CHOLESTEROL, DIRECT: Direct LDL: 76.8 mg/dL

## 2012-07-24 NOTE — Progress Notes (Signed)
  Subjective:    Patient ID: [name] [name], female    DOB: [birth year], 61 y.o.   MRN: [id number]  HPI  Having some great toe pain. She feels like her toenails cutting into the toe. It has been several weeks since she's having body but was able to trim them. #2. Continues to have left mid and lower quadrant abdominal pain. Worse with certain motions and also worse with trying to lie on her side. Her bowel movements have been much more regular and his thigh pain has not seemed related to them at all. #3. Headaches. She seldom headaches specialty clinic and they think her headaches are related to her concussion. They are starting some therapy where they "put needles into my head" she had one treatment 3 days ago and does note some relief.  Review of Systems     Objective:   Physical Exam  GENERAL: Well-developed female walking quite briskly with her walker today. ABDOMINAL exam reveals no tenderness, no guarding, no masses although this is limited somewhat by her habitus. Positive bowel sounds. Area she points to 4 tenderness is nontender to palpation. Her left flank is nontender to percussion. HIPS: Left hip has limited internal and external rotation but is nonpainful. FEET: Bilaterally she has decreased sensation to soft touch. There is very little callus formation on her feet. Her great toenails bilaterally are quite long curved and deformed. I did trim her toenails on both great toes today.      Assessment & Plan:  #1. Toe pain related to dystrophic nails. Tendon is up. #2. Chronic left mid quadrant and lower quadrant abdominal pain. I have no idea what this is related to. We had done CT scan. Initially I thought it was related to constipation but does not seem to have improved with improvement in her bowel habits. I told her quite frankly that although what test to do her where to send her but will continue to keep an eye on this. #3. Headaches. Seems like the headache clinic may be doing  some type of acupuncture. I am hopeful a she is that it will continue to improve. I'll see her back for regular followup in 4-6 weeks.

## 2012-07-31 ENCOUNTER — Other Ambulatory Visit: Payer: Self-pay | Admitting: Family Medicine

## 2012-08-04 MED ORDER — HYDROCHLOROTHIAZIDE 12.5 MG PO CAPS: 12.5000 mg | ORAL_CAPSULE | Freq: Every day | ORAL | Status: AC

## 2012-08-05 ENCOUNTER — Telehealth: Payer: Self-pay | Admitting: Family Medicine

## 2012-08-05 NOTE — Telephone Encounter (Signed)
Has been off of her Vitamin D for about a month and wants to know if she needs to go back on it.

## 2012-08-06 NOTE — Telephone Encounter (Signed)
Dear [facility]if[facility]on Asters Team No she should remind me in 2 moths and we will reh\echeck it then. We gave her the HI DOSE so it should not need replenished again any time soon--remind me to check 2 m [facility]! Denny Levy

## 2012-08-10 NOTE — Telephone Encounter (Signed)
[name] informed that she does not need re-start taking Vitamin D again at this time.  Informed her to remind Dr. Jennette Kettle to check her Vitamin D level in about two months.  Patient stated understanding.  [name] [name]

## 2012-08-29 ENCOUNTER — Other Ambulatory Visit: Payer: Self-pay | Admitting: Family Medicine

## 2012-08-31 ENCOUNTER — Ambulatory Visit: Payer: Medicare Other | Admitting: Family Medicine

## 2012-08-31 ENCOUNTER — Ambulatory Visit (INDEPENDENT_AMBULATORY_CARE_PROVIDER_SITE_OTHER): Payer: Medicare Other | Admitting: Internal Medicine

## 2012-08-31 ENCOUNTER — Encounter: Payer: Self-pay | Admitting: Internal Medicine

## 2012-08-31 VITALS — BP 122/82 | HR 76 | Ht 61.0 in | Wt 197.8 lb

## 2012-08-31 DIAGNOSIS — J45909 Unspecified asthma, uncomplicated: Secondary | ICD-10-CM

## 2012-08-31 DIAGNOSIS — G4733 Obstructive sleep apnea (adult) (pediatric): Secondary | ICD-10-CM

## 2012-08-31 DIAGNOSIS — J301 Allergic rhinitis due to pollen: Secondary | ICD-10-CM

## 2012-08-31 NOTE — Progress Notes (Signed)
Patient ID: [name] [name], female    DOB: September 28, 1950, 61 y.o.   MRN: [id number]  HPI 01/24/11 61 yoF former smoker followed for asthma, sleep apnea, complicated by hx depression, vocal cord paralysis, GERD. Last here August 17, 2010- note reviewed  Doesn't wear CPAP- can't tolerate it on her face. Didn't like nasal pillows or her current nasal mask. Has own teeth. Aware she tosses and turns at night. CPAP 12 Advanced.  Denies recent asthma attacks. Uses rescue inhaler 1-2x/ month. Continues allergy shots and says she breathes better and coughs less since she has been on them.  No longer chokes or coughs much with eating as she used to. No recent colds.   07/31/11- 61 yoF former smoker followed for asthma, sleep apnea, complicated by hx depression, vocal cord paralysis, GERD. Has had flu vaccine. Continues CPAP at 12 CWP,  used all night every night. She had moved, and can't find her nebulizer machine. We discussed whether she would use it this winter. She continues allergy vaccine and believes that has helped.  02/28/12- 61 yoF former smoker followed for asthma, sleep apnea, complicated by hx depression, vocal cord paralysis, GERD. Has not had allergy injection since early part of May due to surgery; good and bad days with sleep-uses CPAP sometimes but not every night. She had cervical spine disc surgery in May and still wears a neck brace and uses a walker. She has dropped off of allergy vaccine. Able to use her CPAP/12 CWP less regularly. She expects this to improve as she recuperates.  Review of Systems-see HPI Constitutional:   No-   weight loss, night sweats, fevers, chills, fatigue,+ lassitude. HEENT:   No-  headaches, difficulty swallowing, tooth/dental problems, sore throat,       No-  sneezing, itching, ear ache, nasal congestion, post nasal drip,  CV:  No-   chest pain, orthopnea, PND, swelling in lower extremities, anasarca, dizziness, palpitations Resp: No-   shortness of breath  with exertion or at rest.              No-   productive cough,  No non-productive cough,  No- coughing up of blood.              No-   change in color of mucus.  Occasional- wheezing.   Skin: No-   rash or lesions. GI:  No-   heartburn, indigestion, abdominal pain, nausea, vomiting,  GU:. MS:  Carpal tunnel pains.  . Neuro-     nothing unusual Psych:  No- change in mood or affect. No depression or anxiety.  No memory loss.     Objective:   Physical Exam General- Alert, Oriented, Affect-appropriate, Distress- none acute Skin- rash-none, lesions- none, excoriation- none Lymphadenopathy- none Head- atraumatic            Eyes- Very thick corrective lenses, PERRLA, conjunctivae clear secretions            Ears- Hearing, canals-normal            Nose- Clear, no-Septal dev, mucus, polyps, erosion, perforation             Throat- Mallampati II , mucosa clear , drainage- none, tonsils- atrophic. + Neck brace Neck- flexible , trachea midline, no stridor , thyroid nl, carotid no bruit Chest - symmetrical excursion , unlabored           Heart/CV- RRR , no murmur , no gallop  , no rub, nl s1 s2                           -  JVD- none , edema- none, stasis changes- none, varices- none           Lung- clear to P&A, wheeze- none, cough- none , dullness-none, rub- none           Chest wall-  Abd-  Br/ Gen/ Rectal- Not done, not indicated Extrem- cyanosis- none, clubbing, none, atrophy- none, + walker Neuro- grossly intact to observation    Patient ID: [name] [name], female    DOB: 08-10-1951, 61 y.o.   MRN: [id number]  HPI 01/24/11 18 yoF former smoker followed for asthma, sleep apnea, complicated by hx depression, vocal cord paralysis, GERD. Last here August 17, 2010- note reviewed  Doesn't wear CPAP- can't tolerate it on her face. Didn't like nasal pillows or her current nasal mask. Has own teeth. Aware she tosses and turns at night. CPAP 12 Advanced.  Denies recent asthma attacks. Uses rescue  inhaler 1-2x/ month. Continues allergy shots and says she breathes better and coughs less since she has been on them.  No longer chokes or coughs much with eating as she used to. No recent colds.   07/31/11- 61 yoF former smoker followed for asthma, sleep apnea, complicated by hx depression, vocal cord paralysis, GERD. Has had flu vaccine. Continues CPAP at 12 CWP,  used all night every night. She had moved, and can't find her nebulizer machine. We discussed whether she would use it this winter. She continues allergy vaccine and believes that has helped.  02/28/12- 61 yoF former smoker followed for asthma, sleep apnea, complicated by hx depression, vocal cord paralysis, GERD. Has not had allergy injection since early part of May due to surgery; good and bad days with sleep-uses CPAP sometimes but not every night. She had cervical spine disc surgery in May and still wears a neck brace and uses a walker. She has dropped off of allergy vaccine. Able to use her CPAP/12 CWP less regularly. She expects this to improve as she recuperates.  08/31/12-61 yoF former smoker followed for asthma, sleep apnea, complicated by hx depression, vocal cord paralysis, GERD. FOLLOWS FOR: doing well with allergies but has noticed sneezing and wheezing through the night-even with CPAP machine; wears CPAP12/ Advanced every night  when at home(6-8 hours) Not needing Brovana nebulizer treatments lately, but glad to have it.  Some sneeze, wheeze, cough at night. Feels liquid rattle in her upper chest but doesn't know if that is reflux.. Sleeps on extra pillow.   Review of Systems-see HPI Constitutional:   No-   weight loss, night sweats, fevers, chills, fatigue,+ lassitude. HEENT:   No-  headaches, difficulty swallowing, tooth/dental problems, sore throat,+ impaired vision      +  sneezing, itching, ear ache, nasal congestion, post nasal drip,  CV:  No-   chest pain, orthopnea, PND, swelling in lower extremities, anasarca,  dizziness, palpitations Resp: No-   shortness of breath with exertion or at rest.              No-   productive cough,  N+ non-productive cough,  No- coughing up of blood.              No-   change in color of mucus.  +Occasional- wheezing.   Skin: No-   rash or lesions. GI:  No-   heartburn, indigestion, abdominal pain, nausea, vomiting,  GU:. MS:  Carpal tunnel pains.  . Neuro-     nothing unusual Psych:  No- change in mood or affect. No depression or anxiety.  No memory loss.     Objective:   Physical Exam General- Alert, Oriented, Affect-appropriate, Distress- none acute. Overweight  Skin- rash-none, lesions- none, excoriation- none Lymphadenopathy- none Head- atraumatic            Eyes- +Very thick corrective lenses, PERRLA, conjunctivae clear secretions            Ears- Hearing, canals-normal            Nose- Clear, no-Septal dev, mucus, polyps, erosion, perforation             Throat- Mallampati II , mucosa clear , drainage- none, tonsils- atrophic.  Neck- flexible , trachea midline, no stridor , thyroid nl, carotid no bruit Chest - symmetrical excursion , unlabored           Heart/CV- RRR , no murmur , no gallop  , no rub, nl s1 s2                           - JVD- none , edema- none, stasis changes- none, varices- none           Lung- clear to P&A, wheeze- none, cough- none , dullness-none, rub- none           Chest wall-  Abd-  Br/ Gen/ Rectal- Not done, not indicated Extrem- cyanosis- none, clubbing, none, atrophy- none, + walker Neuro- grossly intact to observation

## 2012-08-31 NOTE — Patient Instructions (Addendum)
We can stay off allergy shots and see how you do this spring in the pollen season  Consider trying to put a brick under each of the head legs of your bed.  This may help keep stomach juice down hill, so reflux at night doesn't make you cough.  We can continue CPAP at 12/ Advanced  Please call as needed

## 2012-09-12 DIAGNOSIS — J45909 Unspecified asthma, uncomplicated: Secondary | ICD-10-CM | POA: Insufficient documentation

## 2012-09-12 NOTE — Assessment & Plan Note (Signed)
Remains off of allergy vaccine. Some sneezing especially after using nasal spray.

## 2012-09-12 NOTE — Assessment & Plan Note (Signed)
I can't tell that she is aspirating but an asthmatic bronchitis would be the best explanation for her intermittent cough and wheeze. We discussed management and use of bronchodilators.

## 2012-09-12 NOTE — Assessment & Plan Note (Signed)
Adequate compliance and control with CPAP

## 2012-10-05 ENCOUNTER — Other Ambulatory Visit: Payer: Self-pay | Admitting: *Deleted

## 2012-10-05 DIAGNOSIS — I10 Essential (primary) hypertension: Secondary | ICD-10-CM

## 2012-10-06 MED ORDER — FUROSEMIDE 20 MG PO TABS: 20.0000 mg | ORAL_TABLET | Freq: Every day | ORAL | Status: DC

## 2012-10-07 ENCOUNTER — Ambulatory Visit (INDEPENDENT_AMBULATORY_CARE_PROVIDER_SITE_OTHER): Payer: Medicare Other | Admitting: Family Medicine

## 2012-10-07 VITALS — BP 154/69 | HR 76 | Temp 98.6°F | Ht 61.0 in

## 2012-10-07 DIAGNOSIS — R109 Unspecified abdominal pain: Secondary | ICD-10-CM

## 2012-10-07 DIAGNOSIS — L608 Other nail disorders: Secondary | ICD-10-CM

## 2012-10-07 DIAGNOSIS — L603 Nail dystrophy: Secondary | ICD-10-CM

## 2012-10-07 DIAGNOSIS — G609 Hereditary and idiopathic neuropathy, unspecified: Secondary | ICD-10-CM

## 2012-10-07 DIAGNOSIS — M25559 Pain in unspecified hip: Secondary | ICD-10-CM

## 2012-10-07 DIAGNOSIS — E119 Type 2 diabetes mellitus without complications: Secondary | ICD-10-CM

## 2012-10-07 DIAGNOSIS — G8929 Other chronic pain: Secondary | ICD-10-CM

## 2012-10-07 DIAGNOSIS — M25551 Pain in right hip: Secondary | ICD-10-CM

## 2012-10-07 DIAGNOSIS — M546 Pain in thoracic spine: Secondary | ICD-10-CM

## 2012-10-07 LAB — POCT GLYCOSYLATED HEMOGLOBIN (HGB A1C): Hemoglobin A1C: 6.6

## 2012-10-07 LAB — COMPREHENSIVE METABOLIC PANEL
ALT: 39 U/L — ABNORMAL HIGH (ref 0–35)
AST: 33 U/L (ref 0–37)
Albumin: 3.9 g/dL (ref 3.5–5.2)
Alkaline Phosphatase: 101 U/L (ref 39–117)
BUN: 10 mg/dL (ref 6–23)
CO2: 29 mEq/L (ref 19–32)
Calcium: 8.9 mg/dL (ref 8.4–10.5)
Chloride: 110 mEq/L (ref 96–112)
Creat: 0.73 mg/dL (ref 0.50–1.10)
Glucose, Bld: 183 mg/dL — ABNORMAL HIGH (ref 70–99)
Potassium: 3.8 mEq/L (ref 3.5–5.3)
Sodium: 145 mEq/L (ref 135–145)
Total Bilirubin: 0.2 mg/dL — ABNORMAL LOW (ref 0.3–1.2)
Total Protein: 6.3 g/dL (ref 6.0–8.3)

## 2012-10-07 NOTE — Assessment & Plan Note (Signed)
Please look at this with a CT of abdomen and pelvis, MRI of lumbar spine. I have thought that a lot of this is related to bowel, probably related to constipation. It is really troubling for her. We discussed at length today. She feels like would not work this up adequately. The only thing I think we have not done that is reasonable is thoracic spine and bilateral hip films which we'll do today.

## 2012-10-07 NOTE — Assessment & Plan Note (Signed)
I trimmed her toenails today.

## 2012-10-07 NOTE — Assessment & Plan Note (Signed)
No episodes of low blood sugar. No problems with her medicines. No increase in urination. Continue current medications. Check labs today.

## 2012-10-07 NOTE — Assessment & Plan Note (Signed)
Continue current medications. 

## 2012-10-07 NOTE — Progress Notes (Signed)
  Subjective:    Patient ID: [name] [name], female    DOB: 06/25/1951, 62 y.o.   MRN: [id number]  HPI #1. Followup back side and hip pain. Says something really needs to be done about this. She is having chronic back and side pain that radiates to both hips. Causing difficulty moving. #2. Lower extremity edema today. She does not like it taking the diuretics when she has to go out. When she has edema her peripheral neuropathy in her feet is worse. #3. Hypertrophic toenails. #4. Left foot pain. Under the metatarsal heads. She needs new diabetic shoes.   Review of Systems Denies fever, sweats, chills. No unusual weight loss. Continues to have peripheral neuropathy with burning paresthesias bilateral feet area bilateral hands have numbness. Denies chest pain. Denies depression.    Objective:   Physical Exam  Vital signs reviewed. GENERAL: Well-developed, well-nourished, no acute distress. Using walker. CARDIOVASCULAR: Regular rate and rhythm no murmur gallop or rub LUNGS: Clear to auscultation bilaterally, no rales or wheeze. ABDOMEN: Soft positive bowel sounds NEURO: Decreased sensation to soft touch bilaterally from mid shin down and bilateral hands. Decreased grip strength left hand. Legally blind B with nystagmus horizontal. MSK: Movement of extremity x 4. Decreased use of left hand.        Assessment & Plan:

## 2012-10-08 LAB — VITAMIN D 25 HYDROXY (VIT D DEFICIENCY, FRACTURES): Vit D, 25-Hydroxy: 18 ng/mL — ABNORMAL LOW (ref 30–89)

## 2012-10-09 ENCOUNTER — Encounter: Payer: Self-pay | Admitting: Family Medicine

## 2012-10-12 ENCOUNTER — Ambulatory Visit (HOSPITAL_COMMUNITY)
Admission: RE | Admit: 2012-10-12 | Discharge: 2012-10-12 | Disposition: A | Discharge status: Home / Self Care | Payer: Medicare Other | Source: Ambulatory Visit | Attending: Family Medicine | Admitting: Family Medicine

## 2012-10-12 DIAGNOSIS — M25552 Pain in left hip: Secondary | ICD-10-CM

## 2012-10-12 DIAGNOSIS — M899 Disorder of bone, unspecified: Secondary | ICD-10-CM | POA: Insufficient documentation

## 2012-10-12 DIAGNOSIS — M25559 Pain in unspecified hip: Secondary | ICD-10-CM | POA: Insufficient documentation

## 2012-10-12 DIAGNOSIS — G8929 Other chronic pain: Secondary | ICD-10-CM | POA: Insufficient documentation

## 2012-10-12 DIAGNOSIS — M25551 Pain in right hip: Secondary | ICD-10-CM

## 2012-10-12 DIAGNOSIS — M546 Pain in thoracic spine: Secondary | ICD-10-CM

## 2012-11-07 ENCOUNTER — Other Ambulatory Visit: Payer: Self-pay | Admitting: Family Medicine

## 2012-11-09 ENCOUNTER — Telehealth: Payer: Self-pay | Admitting: Family Medicine

## 2012-11-09 NOTE — Telephone Encounter (Signed)
Saw patient last week and reviewed her meds - noted that she is not taking her Vitamin D3 and that her labs were abnormal.  Wants to know if she should continue taking this.  pls advise

## 2012-11-10 NOTE — Telephone Encounter (Signed)
Related message,pt voiced understanding. [name] [name] S  

## 2012-11-10 NOTE — Telephone Encounter (Signed)
LVM for patient to call back. ?

## 2012-11-10 NOTE — Telephone Encounter (Signed)
Her labs are normal except her Vitamin D level is stil a little low--I would recommend she continue th Vit D supplement once a week for 8 more weeks THANKS! Denny Levy

## 2012-11-11 ENCOUNTER — Other Ambulatory Visit: Payer: Self-pay | Admitting: Family Medicine

## 2012-11-11 MED ORDER — PREGABALIN 200 MG PO CAPS: 200.0000 mg | ORAL_CAPSULE | Freq: Two times a day (BID) | ORAL | Status: DC

## 2012-11-11 MED ORDER — METFORMIN HCL 1000 MG PO TABS: ORAL_TABLET | ORAL | Status: DC

## 2012-11-11 MED ORDER — ACYCLOVIR 200 MG PO CAPS: 400.0000 mg | ORAL_CAPSULE | Freq: Two times a day (BID) | ORAL | Status: DC

## 2012-11-16 ENCOUNTER — Telehealth: Payer: Self-pay | Admitting: Family Medicine

## 2012-11-16 NOTE — Telephone Encounter (Signed)
Took BP this am & it was 167/109 HR 83 - slight headache Took again an hour later after taking her BP meds & it was 158/101  At 3:00 it was 147/93 HR 83 - still have a slight HA  Not sure if Dr Jennette Kettle needs to do anything

## 2012-11-17 NOTE — Telephone Encounter (Signed)
Dear [name] [name] Team Please tell her to do two things: 1) make sure she takles her bP medicine EVERY DAY at same time every day 2. If she continues to have headaches daily or this one does not ever go away , let me know I think her BP meds are [facility] for now [facility]! Denny Levy

## 2012-11-17 NOTE — Telephone Encounter (Signed)
Related message,pt voiced understanding. Allayna Erlich S  

## 2012-11-18 ENCOUNTER — Encounter: Payer: Self-pay | Admitting: Family Medicine

## 2012-11-18 ENCOUNTER — Other Ambulatory Visit: Payer: Self-pay | Admitting: *Deleted

## 2012-11-18 DIAGNOSIS — G5600 Carpal tunnel syndrome, unspecified upper limb: Secondary | ICD-10-CM

## 2012-11-18 DIAGNOSIS — G609 Hereditary and idiopathic neuropathy, unspecified: Secondary | ICD-10-CM

## 2012-11-18 DIAGNOSIS — I10 Essential (primary) hypertension: Secondary | ICD-10-CM

## 2012-11-18 MED ORDER — LOSARTAN POTASSIUM 100 MG PO TABS: 100.0000 mg | ORAL_TABLET | Freq: Every day | ORAL | Status: DC

## 2012-11-18 NOTE — Progress Notes (Unsigned)
Patient ID: [name] [name], female   DOB: Jan 27, 1951, 62 y.o.   MRN: [id number] EMG right wrist; 2/1o/2014 modwerate CTS (median neuropatghy)---no evidence denervation

## 2012-11-23 ENCOUNTER — Encounter: Payer: Self-pay | Admitting: Internal Medicine

## 2012-11-23 ENCOUNTER — Ambulatory Visit (INDEPENDENT_AMBULATORY_CARE_PROVIDER_SITE_OTHER): Payer: Medicare Other | Admitting: Internal Medicine

## 2012-11-23 VITALS — BP 131/64 | HR 74 | Ht 61.0 in | Wt 197.0 lb

## 2012-11-23 DIAGNOSIS — I10 Essential (primary) hypertension: Secondary | ICD-10-CM

## 2012-11-23 LAB — CBC WITH DIFFERENTIAL/PLATELET
Basophils Absolute: 0 10*3/uL (ref 0.0–0.1)
Basophils Relative: 0.3 % (ref 0.0–3.0)
Eosinophils Absolute: 0.1 10*3/uL (ref 0.0–0.7)
Eosinophils Relative: 1.3 % (ref 0.0–5.0)
HCT: 34.1 % — ABNORMAL LOW (ref 36.0–46.0)
Hemoglobin: 11.3 g/dL — ABNORMAL LOW (ref 12.0–15.0)
Lymphocytes Relative: 30.5 % (ref 12.0–46.0)
Lymphs Abs: 2.7 10*3/uL (ref 0.7–4.0)
MCHC: 33.3 g/dL (ref 30.0–36.0)
MCV: 85.2 fl (ref 78.0–100.0)
Monocytes Absolute: 0.5 10*3/uL (ref 0.1–1.0)
Monocytes Relative: 5.7 % (ref 3.0–12.0)
Neutro Abs: 5.5 10*3/uL (ref 1.4–7.7)
Neutrophils Relative %: 62.2 % (ref 43.0–77.0)
Platelets: 228 10*3/uL (ref 150.0–400.0)
RBC: 4 Mil/uL (ref 3.87–5.11)
RDW: 16.2 % — ABNORMAL HIGH (ref 11.5–14.6)
WBC: 8.9 10*3/uL (ref 4.5–10.5)

## 2012-11-23 LAB — BASIC METABOLIC PANEL
BUN: 11 mg/dL (ref 6–23)
CO2: 32 mEq/L (ref 19–32)
Calcium: 9.3 mg/dL (ref 8.4–10.5)
Chloride: 97 mEq/L (ref 96–112)
Creatinine, Ser: 0.7 mg/dL (ref 0.4–1.2)
GFR: 115.25 mL/min (ref >60.00)
Glucose, Bld: 114 mg/dL — ABNORMAL HIGH (ref 70–99)
Potassium: 3.5 mEq/L (ref 3.5–5.1)
Sodium: 139 mEq/L (ref 135–145)

## 2012-11-23 LAB — TSH: TSH: 1.79 u[IU]/mL (ref 0.35–5.50)

## 2012-11-23 NOTE — Progress Notes (Addendum)
HPI Patient is a 62 year old with a history of HTN and HL and sleep apnea. Normal myoview in past. Cath in 2005 with normal coronary arteries. I saw her in Sept 2013. Had been on statin in past. Stopped because of concern about cost.  In September I recomm she increase her cozaar to 100 mg per day. SHe says her BP has run 130 to 160/80 to 100.  Deneis CP.  No SOB.   Lipds in November were good. Allergies  Allergen Reactions  . Amlodipine Besylate Swelling  . Lisinopril-Hydrochlorothiazide Cough    REACTION: cough-- changed to arb  . Propoxyphene Hcl Itching  . Valsartan Other (See Comments)    REACTION:  "sleep more the next day after I took it; it made me real tired"  . Metronidazole     REACTION: "just didn't work; my body never did heal from it"    Current Outpatient Prescriptions  Medication Sig Dispense Refill  . acyclovir (ZOVIRAX) 200 MG capsule Take 2 capsules (400 mg total) by mouth 2 (two) times daily.  120 capsule  12  . albuterol (PROVENTIL HFA;VENTOLIN HFA) 108 (90 BASE) MCG/ACT inhaler Inhale 2 puffs into the lungs every 4 (four) hours as needed. For shortness of breath      . allopurinol (ZYLOPRIM) 300 MG tablet Take 300 mg by mouth daily.      [name] arformoterol (BROVANA) 15 MCG/2ML NEBU Take 15 mcg by nebulization 2 (two) times daily as needed. For shortness of breath      . aspirin EC 81 MG tablet Take 81 mg by mouth daily.       [name] atorvastatin (LIPITOR) 20 MG tablet Take 20 mg by mouth daily.       [name] azelastine (ASTELIN) 137 MCG/SPRAY nasal spray PLACE 2 SPRAYS INTO THE NOSE DAILY AS   NEEDED USE IN EACH NOSTRIL ASDIRECTED  30 mL  12  . beclomethasone (QVAR) 80 MCG/ACT inhaler Inhale 2 puffs into the lungs as needed. For allergies      . brimonidine (ALPHAGAN P) 0.1 % SOLN Place 1 drop into the left eye 2 (two) times daily.        [name] docusate sodium (COLACE) 100 MG capsule Take 100 mg by mouth 2 (two) times daily as needed. For constipation      . esomeprazole (NEXIUM) 40  MG capsule Take 40 mg by mouth 2 (two) times daily.      . furosemide (LASIX) 20 MG tablet Take 1 tablet (20 mg total) by mouth daily.  30 tablet  3  . gabapentin (NEURONTIN) 300 MG capsule 300 mg. AS NEEDED FOR HEADACHES      . hydrochlorothiazide (MICROZIDE) 12.5 MG capsule Take 1 capsule (12.5 mg total) by mouth daily.  90 capsule  3  . insulin glargine (LANTUS SOLOSTAR) 100 UNIT/ML injection Inject 40 units subcutaneously daily Generic OK  15 mL  12  . latanoprost (XALATAN) 0.005 % ophthalmic solution Place 1 drop into both eyes at bedtime.      [name] losartan (COZAAR) 100 MG tablet Take 1 tablet (100 mg total) by mouth daily.  30 tablet  4  . metFORMIN (GLUCOPHAGE) 1000 MG tablet Take 1.5 tablet in the morning and 1 tablet in the evening  75 tablet  12  . metoprolol succinate (TOPROL-XL) 50 MG 24 hr tablet Take 2 tablets (100 mg total) by mouth daily.  160 tablet  3  . montelukast (SINGULAIR) 10 MG tablet Take 10 mg by mouth  daily.      . nitroGLYCERIN (NITROSTAT) 0.4 MG SL tablet Place 0.4 mg under the tongue every 5 (five) minutes as needed. For chest pain      . polyethylene glycol (MIRALAX / GLYCOLAX) packet Take 17 g by mouth daily as needed. For constipation      . pregabalin (LYRICA) 200 MG capsule Take 1 capsule (200 mg total) by mouth 2 (two) times daily.  60 capsule  12  . QVAR 80 MCG/ACT inhaler INHALE 1 PUFF INTO THE LUNGS AS NEEDED  FOR WHEEZING.  8.7 g  12  . sertraline (ZOLOFT) 100 MG tablet Take 150 mg by mouth at bedtime.      . traMADol (ULTRAM) 50 MG tablet Take 1-2 tablets (50-100 mg total) by mouth every 8 (eight) hours as needed. For joint pain.  30 tablet  2  . Vitamin D, Ergocalciferol, (DRISDOL) 50000 UNITS CAPS Take 50,000 Units by mouth every 7 (seven) days. On Sunday.       No current facility-administered medications for this visit.    Past Medical History  Diagnosis Date  . Chronic cough     Now followed by pulmonary  . Sleep apnea     on CPAP 12  . HTN  (hypertension)   . Dyslipidemia   . Gout   . History of colonoscopy   . Concussion 11/18/11    "fell at dr's office; hit head"  . Complication of anesthesia     "just wake up coughing; that's all"  . Heart murmur   . Asthma   . COPD (chronic obstructive pulmonary disease)   . Diabetes mellitus type II     "take insulin & pills"  . Blood transfusion   . GERD (gastroesophageal reflux disease)   . Arthritis   . Chronic lower back pain   . Vocal cord paralysis, unilateral partial     "right"  . Depression   . Peripheral neuropathy   . Angina   . Pneumonia     "i've had it once" (11/18/11)  . Exertional dyspnea   . Headache 11/18/11    "I've had mild headaches the last couple days"  . Headache 01/08/12    "pretty constant since 11/18/11's concussion"  . Syncope 06/16/2012    Past Surgical History  Procedure Laterality Date  . Colonoscopy w/ biopsies  11/21/2010    adenoma polyp--no hi grade dysplasia Dr [address]  . Treatment fistula anal    . Breast reduction surgery  04-21-2003  . Tubal ligation  1987  . Cardiac catheterization  03-02-2004  . Bronchoscopy  07-2007  . Knee arthroscopy  08/2000    right  . Shoulder arthroscopy  08/2000    right  . T-score  09/02/04    -0.54 (low risk currently)  . Total abdominal hysterectomy  10-07-2000  . Cesarean section  1987  . Shoulder arthroscopy w/ rotator cuff repair  10/18/2003    left  . Cataract extraction  1955; 1979    bilateral; right eye  . Eye surgery    . Breast biopsy      bilaterally  . Breast cyst excision      right twice; left once  . Carpal tunnel release      left  . Anterior cervical decomp/discectomy fusion  01/15/2012    Procedure: ANTERIOR CERVICAL DECOMPRESSION/DISCECTOMY FUSION 3 LEVELS;  Surgeon: Tia Alert, MD;  Location: [facility];  Service: Neurosurgery;  Laterality: N/A;  Anterior Cervical Decompression Discectomy Fusion Cerivcal  three four, cervical four five, cervical five six, cervical six seven     Family History  Problem Relation Age of Onset  . Diabetes Mother   . Colon cancer Neg Hx     History   Social History  . Marital Status: Single    Spouse Name: N/A    Number of Children: 1  . Years of Education: 12   Occupational History  . unemployed    Social History Main Topics  . Smoking status: Former Smoker -- 0.50 packs/day for .5 years    Types: Cigarettes    Quit date: 09/03/1975  . Smokeless tobacco: Never Used  . Alcohol Use: No  . Drug Use: No  . Sexually Active: Not Currently   Other Topics Concern  . Not on file   Social History Narrative   Approved for section 8 housing (12/03).         Health Care POA:    Emergency Contact: sister, catherine Heyward [phone]   End of Life Plan:    Who lives with you: Has a son who may be autistic and stays with her at times.   Any pets: none   Diet: Pt has a varied diet of protein, starch, and vegetables.   Exercise: Pt walks 1x a week with [place] [employer].   Seatbelts: Pt reports wearing seatbelt when in vehicles.    Hobbies: walking, tv, church, eating          Review of Systems:  All systems reviewed.  They are negative to the above problem except as previously stated.  Vital Signs: BP 131/64  Pulse 74  Ht 5\' 1"  (1.549 m)  Wt 197 lb (89.359 kg)  BMI 37.24 kg/m2  Physical Exam Patient is in NAD HEENT:  Normocephalic, atraumatic. EOMI, PERRLA.  Neck: JVP is normal.  No bruits.  Lungs: clear to auscultation. No rales no wheezes.  Heart: Regular rate and rhythm. Normal S1, S2. No S3.   No significant murmurs. PMI not displaced.  Abdomen:  Supple, nontender. Normal bowel sounds. No masses. No hepatomegaly.  Extremities:   Good distal pulses throughout. No lower extremity edema.  Musculoskeletal :moving all extremities.  Neuro:   alert and oriented x3.  CN II-XII grossly intact.  EKG;  SR  LAD  LVH  74 bpm Assessment and Plan:  1.  HTN  BP is higher at home.  Will  check BMET and TSH  F/U with patinet  May add low dose amlodipine to regimen  2.  HL  Continue lipitor.

## 2012-11-23 NOTE — Patient Instructions (Addendum)
Labs today:  CBC, BMET, TSH

## 2012-11-25 ENCOUNTER — Telehealth: Payer: Self-pay | Admitting: Internal Medicine

## 2012-11-25 NOTE — Telephone Encounter (Signed)
New Prob   Pt calling to follow up on lab result. Would like to speak to someone.

## 2012-11-25 NOTE — Telephone Encounter (Signed)
Labs OK  I would recomm adding 2.5 mg amlodipine to regimen. Should keep log of BP over next month.  COme in for BP check in 4 to 6 wks.

## 2012-11-25 NOTE — Telephone Encounter (Signed)
Pt called for lab results.Pt states Dr. Tenny Craw wanted to review labs prior recommended BP medication. Labs results given to pt, but  results need to be review per MD. Pt is aware that we will call her back when Dr. Tenny Craw MD review labs and make recommendations.

## 2012-11-27 NOTE — Telephone Encounter (Signed)
She has Amlodipine listed as an allergy due to swelling.  Proceed?

## 2012-11-30 NOTE — Telephone Encounter (Signed)
WOuld increase AM toprol to 2 tabs  Take 1 at night.  Follow BP and symptoms

## 2012-12-01 ENCOUNTER — Telehealth: Payer: Self-pay

## 2012-12-01 NOTE — Telephone Encounter (Signed)
pt called to check on Dr.Ross response to her stopping her amlodipine. advised pt Dr.Ross wants her to increase her toprol to 2 tabs in the a.m. and 1 in the p.m.pt verbalized understanding.

## 2012-12-02 ENCOUNTER — Other Ambulatory Visit: Payer: Self-pay | Admitting: *Deleted

## 2012-12-02 MED ORDER — MONTELUKAST SODIUM 10 MG PO TABS: 10.0000 mg | ORAL_TABLET | Freq: Every day | ORAL | Status: DC

## 2012-12-02 MED ORDER — METOPROLOL SUCCINATE ER 50 MG PO TB24: ORAL_TABLET | ORAL | Status: DC

## 2012-12-02 NOTE — Telephone Encounter (Signed)
Pt notified and Metoprolol refilled with new sig.

## 2012-12-03 ENCOUNTER — Other Ambulatory Visit: Payer: Self-pay | Admitting: Internal Medicine

## 2012-12-09 ENCOUNTER — Encounter: Payer: Self-pay | Admitting: Family Medicine

## 2012-12-09 ENCOUNTER — Ambulatory Visit (INDEPENDENT_AMBULATORY_CARE_PROVIDER_SITE_OTHER): Payer: Medicare Other | Admitting: Family Medicine

## 2012-12-09 VITALS — BP 139/82 | HR 74 | Temp 97.8°F | Ht 61.0 in

## 2012-12-09 DIAGNOSIS — N390 Urinary tract infection, site not specified: Secondary | ICD-10-CM

## 2012-12-09 DIAGNOSIS — R300 Dysuria: Secondary | ICD-10-CM

## 2012-12-09 LAB — POCT URINALYSIS DIPSTICK
Bilirubin, UA: NEGATIVE
Glucose, UA: NEGATIVE
Ketones, UA: NEGATIVE
Nitrite, UA: NEGATIVE
Protein, UA: NEGATIVE
Spec Grav, UA: 1.01
Urobilinogen, UA: 0.2
pH, UA: 6.5

## 2012-12-09 LAB — POCT UA - MICROSCOPIC ONLY

## 2012-12-09 MED ORDER — CEPHALEXIN 500 MG PO CAPS: 500.0000 mg | ORAL_CAPSULE | Freq: Two times a day (BID) | ORAL | Status: DC

## 2012-12-09 NOTE — Addendum Note (Signed)
Addended by: [place], Aleen Marston on: 12/09/2012 12:00 PM   Modules accepted: Orders

## 2012-12-09 NOTE — Assessment & Plan Note (Signed)
Treatment with Keflex x 7 days. Treat based on symptoms and U/A. Urine culture sent Call or return to clinic prn if these symptoms worsen or fail to improve as anticipated, or if she begins to have any back pain, fevers, or chills.

## 2012-12-09 NOTE — Patient Instructions (Signed)
It does look like you have a bladder infection.    Take the Keflex twice daily for the next 7 days to help with this.    If you start having fevers,chills, nausea or vomiting be sure to come back and see [place].

## 2012-12-09 NOTE — Progress Notes (Signed)
Patient ID: [name], female   DOB: April 20, 1951, 62 y.o.   MRN: [id number] SUBJECTIVE: [name] [name] is a 62 y.o. female who complains of urinary frequency, urgency and dysuria x 5 days, without flank pain, fever, chills, or abnormal vaginal discharge or bleeding.  Also noted some blood in her urine 4 days ago. Resolved, but back today which prompted her to come in.    OBJECTIVE: Appears well, in no apparent distress.  Vital signs are normal. The abdomen is soft without tenderness, guarding, mass, rebound or organomegaly. No CVA tenderness or inguinal adenopathy noted.

## 2012-12-10 LAB — URINE CULTURE
Colony Count: NO GROWTH
Organism ID, Bacteria: NO GROWTH

## 2012-12-17 ENCOUNTER — Telehealth: Payer: Self-pay | Admitting: Family Medicine

## 2012-12-17 MED ORDER — SERTRALINE HCL 100 MG PO TABS: 150.0000 mg | ORAL_TABLET | Freq: Every day | ORAL | Status: DC

## 2012-12-17 MED ORDER — METFORMIN HCL 1000 MG PO TABS: ORAL_TABLET | ORAL | Status: DC

## 2012-12-17 MED ORDER — CEPHALEXIN 500 MG PO CAPS: ORAL_CAPSULE | ORAL | Status: DC

## 2012-12-17 MED ORDER — ESOMEPRAZOLE MAGNESIUM 40 MG PO CPDR: 40.0000 mg | DELAYED_RELEASE_CAPSULE | Freq: Two times a day (BID) | ORAL | Status: DC

## 2012-12-17 NOTE — Telephone Encounter (Signed)
Pt has dentist appt on 5/12 and needs abx for pre-medication  Also needs for Metformin, Nexium & Zoloft - pt is out because of the change of pharmacies   [facility] [address]

## 2013-01-06 ENCOUNTER — Telehealth: Payer: Self-pay | Admitting: Family Medicine

## 2013-01-06 NOTE — Telephone Encounter (Signed)
Patient was seen today and her bp was 116/74 and the patient complained about swimmy headedness.  She verified her medications to make sure she is taking what she is supposed to at the right time.  The patient would appreciate a call tomorrow to check in on how she is doing.

## 2013-01-07 NOTE — Telephone Encounter (Signed)
After speaking with patient ,we went over medication she's taking her medications correct,she has a follow up with Dr Jennette Kettle on the 14th,I instructed her to keep appt and to discuss this with Dr Jennette Kettle at her visit and if symptoms get worse to call for a sooner appt. pt voiced understanding. [name] [name], Virgel Bouquet

## 2013-01-13 ENCOUNTER — Ambulatory Visit (INDEPENDENT_AMBULATORY_CARE_PROVIDER_SITE_OTHER): Payer: Medicare Other | Admitting: Family Medicine

## 2013-01-13 ENCOUNTER — Encounter: Payer: Self-pay | Admitting: Family Medicine

## 2013-01-13 VITALS — BP 117/51 | HR 68 | Temp 98.1°F | Ht 61.0 in | Wt 197.0 lb

## 2013-01-13 DIAGNOSIS — L98499 Non-pressure chronic ulcer of skin of other sites with unspecified severity: Secondary | ICD-10-CM

## 2013-01-13 DIAGNOSIS — E119 Type 2 diabetes mellitus without complications: Secondary | ICD-10-CM

## 2013-01-13 DIAGNOSIS — M4322 Fusion of spine, cervical region: Secondary | ICD-10-CM

## 2013-01-13 DIAGNOSIS — I10 Essential (primary) hypertension: Secondary | ICD-10-CM

## 2013-01-13 DIAGNOSIS — Q7649 Other congenital malformations of spine, not associated with scoliosis: Secondary | ICD-10-CM

## 2013-01-13 LAB — POCT GLYCOSYLATED HEMOGLOBIN (HGB A1C): Hemoglobin A1C: 7.1

## 2013-01-13 MED ORDER — AMMONIUM LACTATE 12 % EX LOTN: TOPICAL_LOTION | CUTANEOUS | Status: DC | PRN

## 2013-01-13 MED ORDER — METOPROLOL SUCCINATE ER 50 MG PO TB24: ORAL_TABLET | ORAL | Status: DC

## 2013-01-13 MED ORDER — CHOLECALCIFEROL 1.25 MG (50000 UT) PO CAPS: ORAL_CAPSULE | ORAL | Status: DC

## 2013-01-15 DIAGNOSIS — L98499 Non-pressure chronic ulcer of skin of other sites with unspecified severity: Secondary | ICD-10-CM | POA: Insufficient documentation

## 2013-01-15 NOTE — Assessment & Plan Note (Signed)
A1c is in good range. No medication changes.

## 2013-01-15 NOTE — Progress Notes (Signed)
  Subjective:    Patient ID: [name] [name], female    DOB: 1951-07-01, 62 y.o.   MRN: [id number]  HPI  #1. Follow diabetes mellitus. Says her sugars accident up some, she's not sure why. No episodes of low blood sugar. Taking her medicines rarely #2. Blood pressure is seem to be low in she's had more episodes of dizziness. She wonders if she is on too much medicine. #3. Continues to be constipated. She saw the GI doctor who said that most of her left lower quadrant pain was coming from this chronic constipation. She is on a new regimen for that but has not yet seen any results. #4. Energy level actually seemed to be improved somewhat after she took the replacement for vitamin D. At last count she was still a little low and she would very much like to further supplement. #5. Chronic headaches. Somehow at the headaches and they had put her on Lyrica in addition to the gabapentin she was Re: on. She thinks the Lyrica probably helps the headaches more with the gabapentin according to her seems to help the neuropathy more. She also has a place that would supply a pill 04 supportive her cervical neck fusion. We've been looking for one of those.  Review of Systems Denies unusual weight change. Has no fever, sweats, chills. See history of present illness above for additional pertinent review of systems. Additionally she has no chest pain, no lower extremity edema. Mood is good.    Objective:   Physical Exam  Vital signs are reviewed GENERAL: Well-developed female no acute distress HEENT: Some occasional resting lateral nystagmus. Legally blind bilaterally. No conjunctival icterus.  Neck limited extension and flexion secondary to prior fusion. Mild tenderness to palpation still in the post occipital musculature. CARDIOVASCULAR: Regular rate and rhythm LUNGS: Clear to auscultation FEET: Left plantar portion of her foot still has a very large thickened callus under the second third metatarsal head.  NEURO: Generally she has decreased sensation to soft touch up to about proximal one third of her lower leg. Dorsalis pedis pulses are 2+ bilaterally equal. Refills normal. Feet reveal no other hypertrophy callus or ulcer. MSK: Using her walker. She's made her her way today and very spry fashion. She rises from a chair using the assistance of her walker. She walks with a bit of her posture.     Assessment & Plan:

## 2013-01-15 NOTE — Assessment & Plan Note (Signed)
Will see if we can get her appropriate pillow for neck support

## 2013-01-15 NOTE — Assessment & Plan Note (Signed)
I do believe we have her on a little bit too much hypertensive medicine so we will decrease her beta blocker. If she continues to have dizziness after this so it certainly like to get her off one of her diuretics. I'm not sure how she got on both. I'll see her back in about a month to 6 weeks.

## 2013-01-27 ENCOUNTER — Ambulatory Visit: Payer: Medicare Other | Admitting: Family Medicine

## 2013-01-31 ENCOUNTER — Other Ambulatory Visit: Payer: Self-pay | Admitting: Family Medicine

## 2013-02-24 ENCOUNTER — Ambulatory Visit (INDEPENDENT_AMBULATORY_CARE_PROVIDER_SITE_OTHER): Payer: Medicare Other | Admitting: Family Medicine

## 2013-02-24 ENCOUNTER — Encounter: Payer: Self-pay | Admitting: Family Medicine

## 2013-02-24 VITALS — HR 87 | Temp 99.0°F | Ht 61.0 in | Wt 192.0 lb

## 2013-02-24 DIAGNOSIS — L603 Nail dystrophy: Secondary | ICD-10-CM

## 2013-02-24 DIAGNOSIS — L608 Other nail disorders: Secondary | ICD-10-CM

## 2013-02-24 DIAGNOSIS — G609 Hereditary and idiopathic neuropathy, unspecified: Secondary | ICD-10-CM

## 2013-02-24 DIAGNOSIS — E119 Type 2 diabetes mellitus without complications: Secondary | ICD-10-CM

## 2013-02-24 DIAGNOSIS — R269 Unspecified abnormalities of gait and mobility: Secondary | ICD-10-CM

## 2013-02-24 DIAGNOSIS — L98499 Non-pressure chronic ulcer of skin of other sites with unspecified severity: Secondary | ICD-10-CM

## 2013-02-24 NOTE — Assessment & Plan Note (Signed)
She seems to be doing much better with using her walker today she use much more steady than usual.

## 2013-02-24 NOTE — Assessment & Plan Note (Signed)
She's had some improvement in the ulcerated callus on her plantar portion of her foot. He continues to be painful. I would increase her Lac-Hydrin to 3 times a day, use abrasion with the pumice stone. Ultimately he would be ideal for her to see podiatry for dermabrasion.

## 2013-02-24 NOTE — Assessment & Plan Note (Signed)
And her large toenails bilaterally today

## 2013-02-24 NOTE — Assessment & Plan Note (Signed)
Peripheral neuropathy unchanged.

## 2013-02-24 NOTE — Progress Notes (Signed)
  Subjective:    Patient ID: [name] [name], female    DOB: Feb 19, 1951, 62 y.o.   MRN: [id number]  HPI  #1. Neck pain. Had some improvement after she had the injections for her post concussive headaches. It seemed to help for neck pain too but now the neck pain is back. Mostly posterior, unable to look down that something for any length of time or she has a lot of spasm. #2. Needs her toenails trans- #3. Continues to have plantar pain on the left foot under her metatarsal heads. #4. Constipation is better she still having some issues. She has followup with GI Dr.  Review of Systems Has had no unusual weight change, denies fever, sweats, chills. Mood has been good.    Objective:   Physical Exam Vital signs are reviewed. Notably she has a large pulse pressure with systolic 136 diastolic 56. GENERAL: Well-developed female walking with a walker. EYES: Legally blind. Resting nystagmus. NECK: No lymphadenopathy. She has limited range of motion in forward flexion and extension. Cervical vertebra nontender to palpation. She has a lot of muscle spasm particularly on the left in the posterior splenius capitis muscles. Bilateral trapezius are also somewhat tight. All CV: Regular rate and rhythm EXTREMITY: Decreased sensation to soft touch bilaterally stocking glove distribution. Toenails are somewhat thickened and deformed on the great toes bilaterally. Left foot has a large dime size callus underneath the third metatarsal head.  PROCEDURE NOTE: Trimmed her toenails today.       Assessment & Plan:

## 2013-02-24 NOTE — Assessment & Plan Note (Signed)
The med changes for her diabetes. When she's comes back in 6-8 weeks it would time to check an A1c and BMP. We did discuss little bit more dietary restraint today

## 2013-03-01 ENCOUNTER — Ambulatory Visit (INDEPENDENT_AMBULATORY_CARE_PROVIDER_SITE_OTHER): Payer: Medicare Other | Admitting: Internal Medicine

## 2013-03-01 ENCOUNTER — Other Ambulatory Visit: Payer: Self-pay | Admitting: Family Medicine

## 2013-03-01 ENCOUNTER — Encounter: Payer: Self-pay | Admitting: Internal Medicine

## 2013-03-01 VITALS — BP 158/80 | HR 83 | Ht 61.0 in | Wt 194.6 lb

## 2013-03-01 DIAGNOSIS — J301 Allergic rhinitis due to pollen: Secondary | ICD-10-CM

## 2013-03-01 DIAGNOSIS — G4733 Obstructive sleep apnea (adult) (pediatric): Secondary | ICD-10-CM

## 2013-03-01 DIAGNOSIS — J449 Chronic obstructive pulmonary disease, unspecified: Secondary | ICD-10-CM

## 2013-03-01 NOTE — Patient Instructions (Addendum)
I will have the allergy lab contact you about re-starting your allergy vaccine here  We can continue present meds  We can continue CPAP 12/ Advanced

## 2013-03-01 NOTE — Assessment & Plan Note (Signed)
Discussed risk/ benefit, transportation limits. She felt better on allergy shots and would like to resume. Plan- resume previous vaccine and build weekly because of transport limitation.

## 2013-03-01 NOTE — Progress Notes (Addendum)
Patient ID: [name] [name], female    DOB: 11-30-1950, 62 y.o.   MRN: [id number]  HPI 01/24/11 37 yoF former smoker followed for asthma, sleep apnea, complicated by hx depression, vocal cord paralysis, GERD. Last here August 17, 2010- note reviewed  Doesn't wear CPAP- can't tolerate it on her face. Didn't like nasal pillows or her current nasal mask. Has own teeth. Aware she tosses and turns at night. CPAP 12 Advanced.  Denies recent asthma attacks. Uses rescue inhaler 1-2x/ month. Continues allergy shots and says she breathes better and coughs less since she has been on them.  No longer chokes or coughs much with eating as she used to. No recent colds.   07/31/11- 49 yoF former smoker followed for asthma, sleep apnea, complicated by hx depression, vocal cord paralysis, GERD. Has had flu vaccine. Continues CPAP at 12 CWP,  used all night every night. She had moved, and can't find her nebulizer machine. We discussed whether she would use it this winter. She continues allergy vaccine and believes that has helped.  02/28/12- 87 yoF former smoker followed for asthma, sleep apnea, complicated by hx depression, vocal cord paralysis, GERD. Has not had allergy injection since early part of May due to surgery; good and bad days with sleep-uses CPAP sometimes but not every night. She had cervical spine disc surgery in May and still wears a neck brace and uses a walker. She has dropped off of allergy vaccine. Able to use her CPAP/12 CWP less regularly. She expects this to improve as she recuperates.  Review of Systems-see HPI Constitutional:   No-   weight loss, night sweats, fevers, chills, fatigue,+ lassitude. HEENT:   No-  headaches, difficulty swallowing, tooth/dental problems, sore throat,       No-  sneezing, itching, ear ache, nasal congestion, post nasal drip,  CV:  No-   chest pain, orthopnea, PND, swelling in lower extremities, anasarca, dizziness, palpitations Resp: No-   shortness of breath  with exertion or at rest.              No-   productive cough,  No non-productive cough,  No- coughing up of blood.              No-   change in color of mucus.  Occasional- wheezing.   Skin: No-   rash or lesions. GI:  No-   heartburn, indigestion, abdominal pain, nausea, vomiting,  GU:. MS:  Carpal tunnel pains.  . Neuro-     nothing unusual Psych:  No- change in mood or affect. No depression or anxiety.  No memory loss.     Objective:   Physical Exam General- Alert, Oriented, Affect-appropriate, Distress- none acute Skin- rash-none, lesions- none, excoriation- none Lymphadenopathy- none Head- atraumatic            Eyes- Very thick corrective lenses, PERRLA, conjunctivae clear secretions            Ears- Hearing, canals-normal            Nose- Clear, no-Septal dev, mucus, polyps, erosion, perforation             Throat- Mallampati II , mucosa clear , drainage- none, tonsils- atrophic. + Neck brace Neck- flexible , trachea midline, no stridor , thyroid nl, carotid no bruit Chest - symmetrical excursion , unlabored           Heart/CV- RRR , no murmur , no gallop  , no rub, nl s1 s2                           -  JVD- none , edema- none, stasis changes- none, varices- none           Lung- clear to P&A, wheeze- none, cough- none , dullness-none, rub- none           Chest wall-  Abd-  Br/ Gen/ Rectal- Not done, not indicated Extrem- cyanosis- none, clubbing, none, atrophy- none, + walker Neuro- grossly intact to observation    Patient ID: [name] [name], female    DOB: 05/01/1951, 62 y.o.   MRN: [id number]  HPI 01/24/11 16 yoF former smoker followed for asthma, sleep apnea, complicated by hx depression, vocal cord paralysis, GERD. Last here August 17, 2010- note reviewed  Doesn't wear CPAP- can't tolerate it on her face. Didn't like nasal pillows or her current nasal mask. Has own teeth. Aware she tosses and turns at night. CPAP 12 Advanced.  Denies recent asthma attacks. Uses rescue  inhaler 1-2x/ month. Continues allergy shots and says she breathes better and coughs less since she has been on them.  No longer chokes or coughs much with eating as she used to. No recent colds.   07/31/11- 40 yoF former smoker followed for asthma, sleep apnea, complicated by hx depression, vocal cord paralysis, GERD. Has had flu vaccine. Continues CPAP at 12 CWP,  used all night every night. She had moved, and can't find her nebulizer machine. We discussed whether she would use it this winter. She continues allergy vaccine and believes that has helped.  02/28/12- 80 yoF former smoker followed for asthma, sleep apnea, complicated by hx depression, vocal cord paralysis, GERD. Has not had allergy injection since early part of May due to surgery; good and bad days with sleep-uses CPAP sometimes but not every night. She had cervical spine disc surgery in May and still wears a neck brace and uses a walker. She has dropped off of allergy vaccine. Able to use her CPAP/12 CWP less regularly. She expects this to improve as she recuperates.  08/31/12-59 yoF former smoker followed for asthma, sleep apnea, complicated by hx depression, vocal cord paralysis, GERD. FOLLOWS FOR: doing well with allergies but has noticed sneezing and wheezing through the night-even with CPAP machine; wears CPAP12/ Advanced every night  when at home(6-8 hours) Not needing Brovana nebulizer treatments lately, but glad to have it.  Some sneeze, wheeze, cough at night. Feels liquid rattle in her upper chest but doesn't know if that is reflux.. Sleeps on extra pillow.   03/01/13- 61 yoF former smoker followed for asthma/ COPD, sleep apnea, complicated by hx depression, vocal cord paralysis, GERD. FOLLOWS FOR: has good and bad days since no longer on vaccine; Wears CPAP 12/ Advanced every night for about 8-9 hours; pressure working well for patient. Neck pain s/p c-spine fusion, ,imits comfortable sleep position with CPAP. Occ wheeze  and persistent nose and chest congestion despite meds. Would like to restart allergy vaccine- discussed. Thinks she is mobile enough with [facility] transport to get here.  PFT 2011 documented moderate obstruction w/ little response to bronchodilator.   Review of Systems-see HPI Constitutional:   No-   weight loss, night sweats, fevers, chills, fatigue, lassitude. HEENT:   No-  headaches, difficulty swallowing, tooth/dental problems, sore throat,+ impaired vision      +  sneezing, itching, ear ache, +nasal congestion, post nasal drip,  CV:  No-   chest pain, orthopnea, PND, swelling in lower extremities, anasarca, dizziness, palpitations Resp: No-   shortness of breath with exertion or at rest.  No-   productive cough,  + non-productive cough,  No- coughing up of blood.              No-   change in color of mucus.  +Occasional- wheezing.   Skin: No-   rash or lesions. GI:  No-   heartburn, indigestion, abdominal pain, nausea, vomiting,  GU:. MS:  Carpal tunnel pains.  . Neuro-     nothing unusual Psych:  No- change in mood or affect. No depression or anxiety.  No memory loss.     Objective:   Physical Exam General- Alert, Oriented, Affect-appropriate, Distress- none acute. Overweight  Skin- rash-none, lesions- none, excoriation- none Lymphadenopathy- none Head- atraumatic            Eyes- +Very thick corrective lenses, PERRLA, conjunctivae clear secretions            Ears- Hearing, canals-normal            Nose- Clear, no-Septal dev, mucus, polyps, erosion, perforation             Throat- Mallampati II , mucosa clear , drainage- none, tonsils- atrophic.  Neck- flexible , trachea midline, no stridor , thyroid nl, carotid no bruit Chest - symmetrical excursion , unlabored           Heart/CV- RRR , no murmur , no gallop  , no rub, nl s1 s2                           - JVD- none , edema- none, stasis changes- none, varices- none           Lung- clear to P&A, wheeze- none, cough-  none , dullness-none, rub- none           Chest wall-  Abd-  Br/ Gen/ Rectal- Not done, not indicated Extrem- cyanosis- none, clubbing, none, atrophy- none, + walker Neuro- grossly intact to observation

## 2013-03-01 NOTE — Assessment & Plan Note (Signed)
Good compliance and control 

## 2013-03-09 ENCOUNTER — Telehealth: Payer: Self-pay | Admitting: Internal Medicine

## 2013-03-09 MED ORDER — ALBUTEROL SULFATE HFA 108 (90 BASE) MCG/ACT IN AERS: 2.0000 | INHALATION_SPRAY | RESPIRATORY_TRACT | Status: DC | PRN

## 2013-03-09 MED ORDER — EPINEPHRINE 0.3 MG/0.3ML IJ SOAJ: 0.3000 mg | Freq: Once | INTRAMUSCULAR | Status: DC

## 2013-03-09 NOTE — Telephone Encounter (Signed)
Pt aware that RX's have sent.

## 2013-03-10 ENCOUNTER — Ambulatory Visit (INDEPENDENT_AMBULATORY_CARE_PROVIDER_SITE_OTHER): Payer: Medicare Other

## 2013-03-10 DIAGNOSIS — J309 Allergic rhinitis, unspecified: Secondary | ICD-10-CM

## 2013-03-11 ENCOUNTER — Ambulatory Visit (INDEPENDENT_AMBULATORY_CARE_PROVIDER_SITE_OTHER): Payer: Medicare Other

## 2013-03-11 ENCOUNTER — Other Ambulatory Visit: Payer: Self-pay

## 2013-03-11 DIAGNOSIS — J309 Allergic rhinitis, unspecified: Secondary | ICD-10-CM

## 2013-03-16 ENCOUNTER — Other Ambulatory Visit: Payer: Self-pay | Admitting: Family Medicine

## 2013-03-18 ENCOUNTER — Ambulatory Visit (INDEPENDENT_AMBULATORY_CARE_PROVIDER_SITE_OTHER): Payer: Medicare Other

## 2013-03-18 DIAGNOSIS — J309 Allergic rhinitis, unspecified: Secondary | ICD-10-CM

## 2013-03-25 ENCOUNTER — Ambulatory Visit (INDEPENDENT_AMBULATORY_CARE_PROVIDER_SITE_OTHER): Payer: Medicare Other

## 2013-03-25 DIAGNOSIS — J309 Allergic rhinitis, unspecified: Secondary | ICD-10-CM

## 2013-03-31 ENCOUNTER — Encounter: Payer: Self-pay | Admitting: Family Medicine

## 2013-03-31 ENCOUNTER — Ambulatory Visit (INDEPENDENT_AMBULATORY_CARE_PROVIDER_SITE_OTHER): Payer: Medicare Other | Admitting: Family Medicine

## 2013-03-31 VITALS — BP 114/49 | HR 79 | Temp 99.0°F | Ht 61.0 in | Wt 186.0 lb

## 2013-03-31 DIAGNOSIS — L603 Nail dystrophy: Secondary | ICD-10-CM

## 2013-03-31 DIAGNOSIS — L98499 Non-pressure chronic ulcer of skin of other sites with unspecified severity: Secondary | ICD-10-CM

## 2013-03-31 DIAGNOSIS — L98491 Non-pressure chronic ulcer of skin of other sites limited to breakdown of skin: Secondary | ICD-10-CM

## 2013-03-31 DIAGNOSIS — G609 Hereditary and idiopathic neuropathy, unspecified: Secondary | ICD-10-CM

## 2013-03-31 DIAGNOSIS — E119 Type 2 diabetes mellitus without complications: Secondary | ICD-10-CM

## 2013-03-31 DIAGNOSIS — L608 Other nail disorders: Secondary | ICD-10-CM

## 2013-04-01 ENCOUNTER — Ambulatory Visit (INDEPENDENT_AMBULATORY_CARE_PROVIDER_SITE_OTHER): Payer: Medicare Other

## 2013-04-01 DIAGNOSIS — J309 Allergic rhinitis, unspecified: Secondary | ICD-10-CM

## 2013-04-01 NOTE — Assessment & Plan Note (Signed)
Some decreasing callus size. We'll continue Lac-Hydrin. She has diabetic shoes.

## 2013-04-01 NOTE — Assessment & Plan Note (Signed)
Has had some erratic blood sugars. After long discussion is apparent that she is not able to bottlefeed she needs for proper diet. Back she's not had anything to eat for the last 24 hours. Leg no medication changes but we'll contact social work and see if we can get her set up for additional supplementation.

## 2013-04-01 NOTE — Assessment & Plan Note (Signed)
A trend her toenails today.

## 2013-04-01 NOTE — Assessment & Plan Note (Addendum)
neuropathy unchanged. Continues to be a problem for her. She is on max dose of Lyrica. [facility] in her on additional gabapentin for when necessary use with headaches but she's not using that

## 2013-04-01 NOTE — Progress Notes (Signed)
  Subjective:    Patient ID: [name] [name], female    DOB: 10-28-1950, 62 y.o.   MRN: [id number]  HPI  Followup diabetes. Irradiate blood sugars. Decreased by mouth intake #2. Followup peripheral neuropathy. Continues to be a problem. Symptoms seem to be getting worse in the lower leg. #3. Followup left plantar callus. Still painful  Review of Systems Denies fever, unusual weight change, sweats.    Objective:   Physical Exam  Vital signs are reviewed GENERAL: Well-developed overweight female no acute distress in FEET: Left plantar surface has a quarter-sized callus under the third metatarsal head. It is improved in size from last month's visit but still tender. Fever but no other lesions except for some mild callus right foot plantar heads. NEURO: Decreased sensation soft touch from [place] in down. Also bilaterally glove pattern.      Assessment & Plan:

## 2013-04-07 ENCOUNTER — Telehealth: Payer: Self-pay | Admitting: Internal Medicine

## 2013-04-07 NOTE — Telephone Encounter (Signed)
ATC April at number/extension provided below April is currently out-- spoke with Boyd Kerbs I made her aware that patient has not been seen in our office at all there are no encounters for this patient at all in Epic Boyd Kerbs stated she would let April know in the AM  Will await return call

## 2013-04-08 ENCOUNTER — Ambulatory Visit (INDEPENDENT_AMBULATORY_CARE_PROVIDER_SITE_OTHER): Payer: Medicare Other

## 2013-04-08 DIAGNOSIS — J309 Allergic rhinitis, unspecified: Secondary | ICD-10-CM

## 2013-04-12 ENCOUNTER — Other Ambulatory Visit: Payer: Self-pay

## 2013-04-12 DIAGNOSIS — Z1231 Encounter for screening mammogram for malignant neoplasm of breast: Secondary | ICD-10-CM

## 2013-04-12 NOTE — Telephone Encounter (Signed)
I ATC the # above. Was not able to reach April. I called Melissa and she will check on this

## 2013-04-14 ENCOUNTER — Telehealth: Payer: Self-pay | Admitting: Internal Medicine

## 2013-04-14 NOTE — Telephone Encounter (Signed)
Spoke with Efraim Kaufmann - Melissa states we can disregard this msg.

## 2013-04-14 NOTE — Telephone Encounter (Signed)
Ok to [employer] as requested.   #60, 1 twice daily if needed, refill prn

## 2013-04-14 NOTE — Telephone Encounter (Signed)
CY-we are unable to get Brovana through [facility] (DME on file for patient); can we change her medication to albuterol that she can get through [facility].   Per CY-yes we can give her the standard Albuterol Rx through [facility].   LMTCB-need to let patient know of the change. Speak with Florentina Addison about this.

## 2013-04-14 NOTE — Telephone Encounter (Signed)
Called, spoke with pt - Pt reports she was on Brovana several years ago but hasn't needed to use it until recently d/t chest tightness and chest congestion r/t "muggey" weather.  The medication she has at home from several yrs ago is expired.  Reports she was given samples last week when she came in for allergy vaccine and was advised that we were going to check with Dr .Maple Hudson to see if she needs to cont to use this. Rosalyn Gess is on pt's med list as bid prn.  Pt states she was advised that she may need to use this on a scheduled basis vs a prn basis.  She would like Dr. Roxy Cedar further recs on this and would like a rx sent to DME for medication.  Dr. Maple Hudson, pls Francee Piccolo.  Thank you.

## 2013-04-14 NOTE — Telephone Encounter (Signed)
ATC PT NA phone range several times. WCB

## 2013-04-14 NOTE — Telephone Encounter (Signed)
ATC April at the above # but was unable to reach her.   I have lmomtcb for Melissa to see if she's had the chance to check on this.

## 2013-04-15 ENCOUNTER — Ambulatory Visit (INDEPENDENT_AMBULATORY_CARE_PROVIDER_SITE_OTHER): Payer: Medicare Other

## 2013-04-15 DIAGNOSIS — J309 Allergic rhinitis, unspecified: Secondary | ICD-10-CM

## 2013-04-15 MED ORDER — ALBUTEROL SULFATE (2.5 MG/3ML) 0.083% IN NEBU: 2.5000 mg | INHALATION_SOLUTION | Freq: Four times a day (QID) | RESPIRATORY_TRACT | Status: DC | PRN

## 2013-04-15 NOTE — Telephone Encounter (Signed)
Pt returned [name] [name]'s call.  Pt states she will be in today for an allergy shot & will speak w/ [name] [name] or the nurse at that time.  [name] [name]

## 2013-04-15 NOTE — Telephone Encounter (Signed)
Pt aware and Rx printed signed and given to Melissa at [facility].

## 2013-04-16 ENCOUNTER — Telehealth: Payer: Self-pay | Admitting: Internal Medicine

## 2013-04-16 MED ORDER — ALBUTEROL SULFATE (2.5 MG/3ML) 0.083% IN NEBU: 2.5000 mg | INHALATION_SOLUTION | Freq: Four times a day (QID) | RESPIRATORY_TRACT | Status: DC | PRN

## 2013-04-16 NOTE — Telephone Encounter (Signed)
I spoke with [name] [name]. She stated [facility] is not contracted with [employer]. Pt will need to get this through a local pharmacy. I spoke with pt and she stated she has [employer] I called and made [name] [name] aware and they are not contracted w/ [employer] at all. Pt is wanting to know if there is another DME that can supply this for her w/ her insurance she has. PCC's do you know any DME that will accept her insurance thanks

## 2013-04-16 NOTE — Telephone Encounter (Signed)
Rx has been printed for CY to sign. Once this is done, we will get this rx to [facility].

## 2013-04-16 NOTE — Telephone Encounter (Signed)
i will need and order and rx's to send to [facility] thanks Tobe Sos

## 2013-04-19 ENCOUNTER — Other Ambulatory Visit: Payer: Self-pay | Admitting: Internal Medicine

## 2013-04-19 ENCOUNTER — Other Ambulatory Visit: Payer: Self-pay | Admitting: Family Medicine

## 2013-04-22 ENCOUNTER — Ambulatory Visit (INDEPENDENT_AMBULATORY_CARE_PROVIDER_SITE_OTHER): Payer: Medicare Other

## 2013-04-22 DIAGNOSIS — J309 Allergic rhinitis, unspecified: Secondary | ICD-10-CM

## 2013-04-27 ENCOUNTER — Other Ambulatory Visit: Payer: Self-pay | Admitting: Internal Medicine

## 2013-04-27 DIAGNOSIS — J449 Chronic obstructive pulmonary disease, unspecified: Secondary | ICD-10-CM

## 2013-04-27 MED ORDER — ARFORMOTEROL TARTRATE 15 MCG/2ML IN NEBU: 15.0000 ug | INHALATION_SOLUTION | Freq: Two times a day (BID) | RESPIRATORY_TRACT | Status: DC | PRN

## 2013-04-28 ENCOUNTER — Encounter: Payer: Self-pay | Admitting: Family Medicine

## 2013-04-28 ENCOUNTER — Ambulatory Visit (INDEPENDENT_AMBULATORY_CARE_PROVIDER_SITE_OTHER): Payer: Medicare Other | Admitting: Family Medicine

## 2013-04-28 VITALS — BP 135/54 | HR 85 | Temp 98.3°F | Ht 61.0 in | Wt 189.0 lb

## 2013-04-28 DIAGNOSIS — H543 Unqualified visual loss, both eyes: Secondary | ICD-10-CM

## 2013-04-28 DIAGNOSIS — Z23 Encounter for immunization: Secondary | ICD-10-CM

## 2013-04-28 DIAGNOSIS — E119 Type 2 diabetes mellitus without complications: Secondary | ICD-10-CM

## 2013-04-28 DIAGNOSIS — G609 Hereditary and idiopathic neuropathy, unspecified: Secondary | ICD-10-CM

## 2013-04-28 DIAGNOSIS — Q7649 Other congenital malformations of spine, not associated with scoliosis: Secondary | ICD-10-CM

## 2013-04-28 DIAGNOSIS — M4322 Fusion of spine, cervical region: Secondary | ICD-10-CM

## 2013-04-28 DIAGNOSIS — J449 Chronic obstructive pulmonary disease, unspecified: Secondary | ICD-10-CM | POA: Insufficient documentation

## 2013-04-28 LAB — POCT GLYCOSYLATED HEMOGLOBIN (HGB A1C): Hemoglobin A1C: 7.8

## 2013-04-28 MED ORDER — HYDROCODONE-ACETAMINOPHEN 5-325 MG PO TABS: ORAL_TABLET | ORAL | Status: DC

## 2013-04-28 NOTE — Assessment & Plan Note (Addendum)
She has nebulizer. Albuterol doesn't last long enough. Has to use several times/day/night. We discussed trial of longer half-life med such as Brovana. Look into DME support for this.

## 2013-04-28 NOTE — Addendum Note (Signed)
Addended by: Jetty Duhamel D on: 04/28/2013 04:51 PM   Modules accepted: Level of Service

## 2013-04-29 ENCOUNTER — Ambulatory Visit (INDEPENDENT_AMBULATORY_CARE_PROVIDER_SITE_OTHER): Payer: Medicare Other

## 2013-04-29 DIAGNOSIS — J309 Allergic rhinitis, unspecified: Secondary | ICD-10-CM

## 2013-04-30 ENCOUNTER — Ambulatory Visit (INDEPENDENT_AMBULATORY_CARE_PROVIDER_SITE_OTHER): Payer: Medicare Other

## 2013-04-30 DIAGNOSIS — J309 Allergic rhinitis, unspecified: Secondary | ICD-10-CM

## 2013-04-30 NOTE — Progress Notes (Signed)
  Subjective:    Patient ID: [name] [name], female    DOB: 09/01/1951, 62 y.o.   MRN: [id number]  HPI Followup callus and pain on the left bottom of her foot. She has used the cream with some success. She states continues to have pain there but it is better. Also needs someone to look at her toenails. #2. Followup diabetes mellitus. Her A1c is up a little bit today she has a lot of questions about that. #3. Continues to have chronic neck pain.  Review of Systems No unusual weight change, fever, sweats, chills.    Objective:   Physical Exam Vital signs are reviewed GENERAL: Well-developed female no acute distress HEENT: Blind NECK: Limited extension but essentially non-painful. Some limits of flexion as well. She is status post neck surgery. Tender to palpation at the occipital muscle origin. Lateral rotation is normal. CV: Regular rate and rhythm no murmur LUNGS: Good auscultation bilaterally NEURO: Decreased sensation soft touch stocking glove. FEET: Significant improvement in the large callus on the middle of her plantar foot in the metatarsal heads.       Assessment & Plan:  #1. Diabetes. Little bit decreasing control. I think this is mostly related to dietary issues. We'll have her watch her diet and recheck in 2 months. #2. Chronic neck pain status post ACDF. Not sure there is a whole lot else we can do for this. The headache clinic had tried some injection therapy which seemed to help the headaches but not the neck pain much. #3. Foot pain. The callus is improving. I would continue the Lac-Hydrin.

## 2013-05-06 ENCOUNTER — Ambulatory Visit (INDEPENDENT_AMBULATORY_CARE_PROVIDER_SITE_OTHER): Payer: Medicare Other

## 2013-05-06 DIAGNOSIS — J309 Allergic rhinitis, unspecified: Secondary | ICD-10-CM

## 2013-05-13 ENCOUNTER — Ambulatory Visit (INDEPENDENT_AMBULATORY_CARE_PROVIDER_SITE_OTHER): Payer: Medicare Other

## 2013-05-13 DIAGNOSIS — J309 Allergic rhinitis, unspecified: Secondary | ICD-10-CM

## 2013-05-20 ENCOUNTER — Ambulatory Visit (INDEPENDENT_AMBULATORY_CARE_PROVIDER_SITE_OTHER): Payer: Medicare Other

## 2013-05-20 DIAGNOSIS — J309 Allergic rhinitis, unspecified: Secondary | ICD-10-CM

## 2013-05-21 ENCOUNTER — Ambulatory Visit
Admission: RE | Admit: 2013-05-21 | Discharge: 2013-05-21 | Disposition: A | Discharge status: Home / Self Care | Payer: Medicare HMO | Source: Ambulatory Visit

## 2013-05-21 DIAGNOSIS — Z1231 Encounter for screening mammogram for malignant neoplasm of breast: Secondary | ICD-10-CM

## 2013-05-24 ENCOUNTER — Telehealth: Payer: Self-pay | Admitting: Family Medicine

## 2013-05-24 NOTE — Telephone Encounter (Signed)
Wants dr Jennette Kettle to call her about home health care. Dr Jennette Kettle was suppose to be calling her insurance company to find out how to send over a request for home health care Please advise

## 2013-05-25 NOTE — Telephone Encounter (Signed)
Please advise. [name] [name] S  

## 2013-05-26 ENCOUNTER — Telehealth: Payer: Self-pay | Admitting: Family Medicine

## 2013-05-26 NOTE — Telephone Encounter (Signed)
PT had mammogram at the [facility] 05-21-13. Monday her right breast started going numb. Like yesterday she counted 8 incidents within an hour. This is the same side she has numbness in her hand. She would like a referral to go back to [facility] to [facility] out. Please advise  Cell [phone]

## 2013-05-27 ENCOUNTER — Ambulatory Visit (INDEPENDENT_AMBULATORY_CARE_PROVIDER_SITE_OTHER): Payer: Medicare Other

## 2013-05-27 DIAGNOSIS — J309 Allergic rhinitis, unspecified: Secondary | ICD-10-CM

## 2013-05-27 NOTE — Telephone Encounter (Signed)
Dear Clif[facility] A[facility] woud NOT be a likely place to further eval this since they mainly do imaging. I SUSPECT this is trelated to some mild nerve compression from the mammogram---it is not common but can occur. If this is cause, it may take several weeks  Or months to resolve. I would be HAPPY to see herTODAY ( thusday duringm my [facility] clinic)  even PM Friday here at [facility] (would need ito have pt template put in for off schedule)OR I can see her next week.   I suspect  This is an AGGRAVATIOn but I am not WORRIED that this is something bad or something permanent THANKS! Denny Levy

## 2013-05-27 NOTE — Telephone Encounter (Signed)
Pt states she would like to be seen, so appt is sched for 9/26 at 2:30. OK'd by Dr Jennette Kettle.

## 2013-05-28 ENCOUNTER — Ambulatory Visit (INDEPENDENT_AMBULATORY_CARE_PROVIDER_SITE_OTHER): Payer: Medicare Other | Admitting: Family Medicine

## 2013-05-28 ENCOUNTER — Encounter: Payer: Self-pay | Admitting: Family Medicine

## 2013-05-28 VITALS — BP 125/56 | HR 80 | Temp 98.5°F | Ht 61.0 in | Wt 183.0 lb

## 2013-05-28 DIAGNOSIS — N644 Mastodynia: Secondary | ICD-10-CM

## 2013-05-31 NOTE — Progress Notes (Signed)
  Subjective:    Patient ID: [name] [name], female    DOB: Nov 23, 1950, 62 y.o.   MRN: [id number]  HPI Right breast numbness. She had her mammogram 3 weeks ago and ever since then she's had shooting pains in the numbness and tingling in the right breast. She's also had some tenderness in the left axilla. She thinks both of these saying start about the same time.  PERTINENT  PMH / PSH: Status post breast reduction many years ago Significant and severe peripheral neuropathy thought to be related to her diabetes.   Review of Systems No breast discharge. No specific breast tenderness although she has some tenderness and pain in the left at [facility] she describes shooting pains in shooting tingling in the right breast.    Objective:   Physical Exam  Vital signs are reviewed GENERAL: Well-developed female no acute distress BREASTS: Well-healed reduction scars laterally. Breasts are without worrisome mass. Axillary area is mildly tender to palpation on the left but there is no mass. Breast Arriola is normal and the nipple is somewhat flattened on the right compared to the left. There is no breast discharge. No erythema or skin change.      Assessment & Plan:  Breasts paresthesias. Left axillary tenderness. I'm not sure what this is but I don't think it's anything worrisome. Whether or not it was related to her mammogram I'm not sure. We have reviewed the results from that. Right now we will just follow this. I did reassure her that I think this is anything worrisome. I'll follow her back up in one month.

## 2013-06-03 ENCOUNTER — Ambulatory Visit (INDEPENDENT_AMBULATORY_CARE_PROVIDER_SITE_OTHER): Payer: Medicare Other

## 2013-06-03 DIAGNOSIS — J309 Allergic rhinitis, unspecified: Secondary | ICD-10-CM

## 2013-06-10 ENCOUNTER — Ambulatory Visit (INDEPENDENT_AMBULATORY_CARE_PROVIDER_SITE_OTHER): Payer: Medicare Other

## 2013-06-10 DIAGNOSIS — J309 Allergic rhinitis, unspecified: Secondary | ICD-10-CM

## 2013-06-17 ENCOUNTER — Ambulatory Visit (INDEPENDENT_AMBULATORY_CARE_PROVIDER_SITE_OTHER): Payer: Medicare Other

## 2013-06-17 DIAGNOSIS — J309 Allergic rhinitis, unspecified: Secondary | ICD-10-CM

## 2013-06-23 ENCOUNTER — Telehealth: Payer: Self-pay | Admitting: Clinical

## 2013-06-23 NOTE — Telephone Encounter (Signed)
Clinical Child psychotherapist (CSW) received a referral to provide food resources to pt and see whether she would qualify for Meals on Wheels. CSW contacted pt and explored pt living situation and amount of support available. CSW inquired whether pt would consider a referral to Meals on Wheels as pt is not receiving food stamps, and has a difficult time ambulating/carrying groceries. Pt also confirmed her son works however his income is very minimal. Pt stated she would be agreeable to Meals on Wheels. CSW provided pt with the contact number to [employer] as she would need to answer specific questions to complete the referral. CSW also requested that pt contact CSW with updates/concerns.  Pt contacted CSW back shortly after and stated that someone from [employer] would be scheduling an appt for an pt to do a home visit. Pt was very appreciative and hopeful that she will receive food and potentially transportation assistance through [employer].  CSW to also mail several food resources to pt. Today.  Theresia Bough, MSW, LCSW [phone]

## 2013-06-23 NOTE — Telephone Encounter (Signed)
Message copied by Theresia Bough on Wed Jun 23, 2013 10:15 AM ------      Message from: Nestor Ramp      Created: Tue Jun 22, 2013  3:48 PM       Maxene Byington!      A long time ago I mentioned something to you about Kinzie Danner. I am concerned she does not ea twell--said she di not know if she qualified for Meals on Wheels--I convinced her it was not "charity". I think at end of month she runs out of $$$$ and may go without eating.            Anything you could do would be appreciated      Thanks      Huntley Dec ------

## 2013-06-24 ENCOUNTER — Ambulatory Visit (INDEPENDENT_AMBULATORY_CARE_PROVIDER_SITE_OTHER): Payer: Medicare Other

## 2013-06-24 DIAGNOSIS — J309 Allergic rhinitis, unspecified: Secondary | ICD-10-CM

## 2013-07-01 ENCOUNTER — Ambulatory Visit (INDEPENDENT_AMBULATORY_CARE_PROVIDER_SITE_OTHER): Payer: Medicare Other | Admitting: Internal Medicine

## 2013-07-01 ENCOUNTER — Encounter: Payer: Self-pay | Admitting: Internal Medicine

## 2013-07-01 ENCOUNTER — Ambulatory Visit (INDEPENDENT_AMBULATORY_CARE_PROVIDER_SITE_OTHER)
Admission: RE | Admit: 2013-07-01 | Discharge: 2013-07-01 | Disposition: A | Discharge status: Home / Self Care | Payer: Medicare Other | Source: Ambulatory Visit | Attending: Internal Medicine | Admitting: Internal Medicine

## 2013-07-01 ENCOUNTER — Ambulatory Visit (INDEPENDENT_AMBULATORY_CARE_PROVIDER_SITE_OTHER): Payer: Medicare Other

## 2013-07-01 VITALS — BP 116/62 | HR 77 | Ht 61.0 in | Wt 187.4 lb

## 2013-07-01 DIAGNOSIS — J45909 Unspecified asthma, uncomplicated: Secondary | ICD-10-CM

## 2013-07-01 DIAGNOSIS — Z87891 Personal history of nicotine dependence: Secondary | ICD-10-CM

## 2013-07-01 DIAGNOSIS — J301 Allergic rhinitis due to pollen: Secondary | ICD-10-CM

## 2013-07-01 DIAGNOSIS — J449 Chronic obstructive pulmonary disease, unspecified: Secondary | ICD-10-CM

## 2013-07-01 DIAGNOSIS — J309 Allergic rhinitis, unspecified: Secondary | ICD-10-CM

## 2013-07-01 NOTE — Patient Instructions (Signed)
Order- CXR dx asthma, former smoker  We can continue CPAP 12/ Advanced  We can continue allergy vaccine 1:5,000 GH  Try reducing your Brovana to 1/2 neb twice daily. See if that controls your asthma with less jitter/ nervousnes  Please call as needed

## 2013-07-01 NOTE — Progress Notes (Signed)
Patient ID: [name], female    DOB: 12-31-1950, 62 y.o.   MRN: [id number]  HPI 01/24/11 40 yoF former smoker followed for asthma, sleep apnea, complicated by hx depression, vocal cord paralysis, GERD. Last here August 17, 2010- note reviewed  Doesn't wear CPAP- can't tolerate it on her face. Didn't like nasal pillows or her current nasal mask. Has own teeth. Aware she tosses and turns at night. CPAP 12 Advanced.  Denies recent asthma attacks. Uses rescue inhaler 1-2x/ month. Continues allergy shots and says she breathes better and coughs less since she has been on them.  No longer chokes or coughs much with eating as she used to. No recent colds.   07/31/11- 20 yoF former smoker followed for asthma, sleep apnea, complicated by hx depression, vocal cord paralysis, GERD. Has had flu vaccine. Continues CPAP at 12 CWP,  used all night every night. She had moved, and can't find her nebulizer machine. We discussed whether she would use it this winter. She continues allergy vaccine and believes that has helped.  03/01/13- 61 yoF former smoker followed for asthma/ COPD, sleep apnea, complicated by hx depression, vocal cord paralysis, GERD. FOLLOWS FOR: has good and bad days since no longer on vaccine; Wears CPAP 12/ Advanced every night for about 8-9 hours; pressure working well for patient. Neck pain s/p c-spine fusion, ,imits comfortable sleep position with CPAP. Occ wheeze and persistent nose and chest congestion despite meds. Would like to restart allergy vaccine- discussed. Thinks she is mobile enough with [facility] transport to get here.  PFT 2011 documented moderate obstruction w/ little response to bronchodilator.   07/01/13- 61 yoF former smoker followed for asthma/ COPD, sleep apnea, complicated by hx depression, vocal cord paralysis, GERD. FOLLOWS FOR: wears CPAP 12/ Advanced every night for abour 8 or more hours; pressure doing well for patient. Still on allergy vaccine; noticed she had  some bumps on arms after her injections(varies). Has noticed that about 5-10 minutes after Brovana treatment she has jitters and nervous feelings. Holding vaccine dose at 1:5000 GH. She notices minor bumps but may be unrelated.  Review of Systems-see HPI Constitutional:   No-   weight loss, night sweats, fevers, chills, fatigue, lassitude. HEENT:   No-  headaches, difficulty swallowing, tooth/dental problems, sore throat,+ impaired vision      No-  sneezing, itching, ear ache, +nasal congestion, post nasal drip,  CV:  No-   chest pain, orthopnea, PND, swelling in lower extremities, anasarca, dizziness, palpitations Resp: No-   shortness of breath with exertion or at rest.              No-   productive cough,  + non-productive cough,  No- coughing up of blood.              No-   change in color of mucus.  +Occasional- wheezing.   Skin: -+ per HPI GI:  No-   heartburn, indigestion, abdominal pain, nausea, vomiting,  GU:. MS:  Carpal tunnel pains.  . Neuro-     nothing unusual Psych:  No- change in mood or affect. No depression or anxiety.  No memory loss.     Objective:   Physical Exam General- Alert, Oriented, Affect-appropriate, Distress- none acute. Overweight  Skin- + diffuse tiny subcutaneous nodules on the arms, nontender, mobile. I think these are in the               subcutaneous fat Lymphadenopathy- none Head- atraumatic  Eyes- +Very thick corrective lenses, PERRLA, conjunctivae clear secretions, +strabismus            Ears- Hearing, canals-normal            Nose- Clear, no-Septal dev, mucus, polyps, erosion, perforation             Throat- Mallampati II , mucosa clear , drainage- none, tonsils- atrophic.  Neck- flexible , trachea midline, no stridor , thyroid nl, carotid no bruit Chest - symmetrical excursion , unlabored           Heart/CV- RRR , no murmur , no gallop  , no rub, nl s1 s2                           - JVD- none , edema- none, stasis changes- none,  varices- none           Lung- clear to P&A, wheeze- none, cough- none , dullness-none, rub- none           Chest wall-  Abd-  Br/ Gen/ Rectal- Not done, not indicated Extrem- cyanosis- none, clubbing, none, atrophy- none, + cane Neuro- grossly intact to observation

## 2013-07-02 NOTE — Progress Notes (Signed)
Quick Note:  Advised pt of cxr results per CDY. Pt verbalized understanding and has no further questions at this time ______

## 2013-07-08 ENCOUNTER — Ambulatory Visit (INDEPENDENT_AMBULATORY_CARE_PROVIDER_SITE_OTHER): Payer: Medicare Other

## 2013-07-08 ENCOUNTER — Ambulatory Visit (INDEPENDENT_AMBULATORY_CARE_PROVIDER_SITE_OTHER): Payer: Medicare Other | Admitting: Family Medicine

## 2013-07-08 ENCOUNTER — Encounter: Payer: Self-pay | Admitting: Family Medicine

## 2013-07-08 ENCOUNTER — Ambulatory Visit (HOSPITAL_COMMUNITY)
Admission: RE | Admit: 2013-07-08 | Discharge: 2013-07-08 | Disposition: A | Discharge status: Home / Self Care | Payer: Medicare Other | Source: Ambulatory Visit | Attending: Family Medicine | Admitting: Family Medicine

## 2013-07-08 VITALS — BP 129/64 | HR 91 | Temp 97.3°F | Ht 61.0 in | Wt 187.7 lb

## 2013-07-08 DIAGNOSIS — E119 Type 2 diabetes mellitus without complications: Secondary | ICD-10-CM

## 2013-07-08 DIAGNOSIS — M542 Cervicalgia: Secondary | ICD-10-CM | POA: Insufficient documentation

## 2013-07-08 DIAGNOSIS — J309 Allergic rhinitis, unspecified: Secondary | ICD-10-CM

## 2013-07-08 DIAGNOSIS — M4322 Fusion of spine, cervical region: Secondary | ICD-10-CM

## 2013-07-08 DIAGNOSIS — Q7649 Other congenital malformations of spine, not associated with scoliosis: Secondary | ICD-10-CM

## 2013-07-08 LAB — POCT GLYCOSYLATED HEMOGLOBIN (HGB A1C): Hemoglobin A1C: 8.3

## 2013-07-08 MED ORDER — BACLOFEN 10 MG PO TABS: ORAL_TABLET | ORAL | Status: DC

## 2013-07-09 ENCOUNTER — Telehealth: Payer: Self-pay | Admitting: Family Medicine

## 2013-07-09 NOTE — Telephone Encounter (Signed)
[name] [name] calling to ask about getting a referral for a colonoscopy.  According to a [employer] rep, [name] Etienne, [name] can receive a yearly colonoscopy.  This is suggested by them but not something patient is required to do.  Want her to discuss this with her provider to insure she is getting full benefit of her coverage.  Please call patient back to discuss this as well as results from neck xray

## 2013-07-09 NOTE — Telephone Encounter (Signed)
plase see next phone note for details Denny Levy

## 2013-07-09 NOTE — Telephone Encounter (Signed)
Dear Cliffton Asters Team AND please tell her she DOES NOT NEED OR WANT a yearly colonoscopy--but we can discuss at her next o.v. I think her insurance rep is confused THANKS! Denny Levy

## 2013-07-09 NOTE — Telephone Encounter (Signed)
Dwt Please tell her x rays show NO CHANGE---nothingnew THANKS! [name] [name]

## 2013-07-09 NOTE — Telephone Encounter (Signed)
Related message,patient voiced understanding. Arianny Pun S  

## 2013-07-12 ENCOUNTER — Telehealth: Payer: Self-pay | Admitting: Family Medicine

## 2013-07-12 ENCOUNTER — Encounter: Payer: Self-pay | Admitting: Family Medicine

## 2013-07-12 NOTE — Telephone Encounter (Signed)
Dear Cliffton Asters Team Her A1C was 8.3 Her neck films look the same [facility]! Denny Levy

## 2013-07-12 NOTE — Telephone Encounter (Signed)
Please advise. [name] [name]  

## 2013-07-12 NOTE — Progress Notes (Signed)
  Subjective:    Patient ID: [name] [name], female    DOB: 1950/10/05, 62 y.o.   MRN: [id number]  HPI  Neck pain. Chronic but worse over the last 2 weeks. Most of the pain is in the posterior portion. Hurts a lot at night. No problems swallowing. No new injury. She's concerned that some of her hardware from her previous neck fusion is coming loose.   Blood sugars have been elevated 150-250 where usually she is in the one teens to 160. No increase in thirst or urination.  Review of Systems Pertinent review of systems: negative for fever or unusual weight change.     Objective:   Physical Exam  Vital signs reviewed. GENERAL: Well develped, well nourished, no acute distresso  NECKMildly tender to palpation posterior paracervical neck muscles. Trapezius also with some areas of point tenderness. Flexion and extension are limited which is her baseline, they are without any increase in pain. Lateral rotation also limited but baseline for her. Negative Spurling's.       Assessment & Plan:  : #1. Neck pain acute on chronic in a patient who has history of prior ACDF. Will get x-ray to rule out any hardware shift. We have tried multiple things for her neck pain the past we'll try her on baclofen.  #2. Diabetes mellitus. She's not really due for an A1c checked today but she wants it because her sugars have been up. He was elevated at 8.3. We will increase her Lantus by 5 units each bedtime to total 45 units

## 2013-07-12 NOTE — Telephone Encounter (Signed)
Pt would like lab results.  

## 2013-07-13 NOTE — Telephone Encounter (Signed)
Related message,patient voiced understanding. [name] [name] S  

## 2013-07-15 ENCOUNTER — Ambulatory Visit (INDEPENDENT_AMBULATORY_CARE_PROVIDER_SITE_OTHER): Payer: Medicare Other

## 2013-07-15 DIAGNOSIS — J309 Allergic rhinitis, unspecified: Secondary | ICD-10-CM

## 2013-07-18 NOTE — Assessment & Plan Note (Signed)
Plan-try using just one half of a Brovana ampule in nebulizer twice daily.see if lower dose is better tolerated but still helpful

## 2013-07-18 NOTE — Assessment & Plan Note (Signed)
I think the tiny nodules in her skin are of some kind of lipodystrophy perhaps but not an allergic reaction to her vaccine. Plan-hold current vaccine dose and watch skin lesions

## 2013-07-19 ENCOUNTER — Ambulatory Visit (INDEPENDENT_AMBULATORY_CARE_PROVIDER_SITE_OTHER): Payer: Medicare Other | Admitting: Internal Medicine

## 2013-07-19 ENCOUNTER — Other Ambulatory Visit: Payer: Self-pay | Admitting: Family Medicine

## 2013-07-19 ENCOUNTER — Encounter: Payer: Self-pay | Admitting: Internal Medicine

## 2013-07-19 VITALS — BP 142/82 | HR 74 | Ht 61.0 in | Wt 188.1 lb

## 2013-07-19 DIAGNOSIS — I10 Essential (primary) hypertension: Secondary | ICD-10-CM

## 2013-07-19 DIAGNOSIS — E785 Hyperlipidemia, unspecified: Secondary | ICD-10-CM

## 2013-07-19 LAB — CBC WITH DIFFERENTIAL/PLATELET
Basophils Absolute: 0 10*3/uL (ref 0.0–0.1)
Basophils Relative: 0.4 % (ref 0.0–3.0)
Eosinophils Absolute: 0.1 10*3/uL (ref 0.0–0.7)
Eosinophils Relative: 1 % (ref 0.0–5.0)
HCT: 34.7 % — ABNORMAL LOW (ref 36.0–46.0)
Hemoglobin: 11.2 g/dL — ABNORMAL LOW (ref 12.0–15.0)
Lymphocytes Relative: 26.8 % (ref 12.0–46.0)
Lymphs Abs: 2.6 10*3/uL (ref 0.7–4.0)
MCHC: 32.4 g/dL (ref 30.0–36.0)
MCV: 83.3 fl (ref 78.0–100.0)
Monocytes Absolute: 0.5 10*3/uL (ref 0.1–1.0)
Monocytes Relative: 5.6 % (ref 3.0–12.0)
Neutro Abs: 6.4 10*3/uL (ref 1.4–7.7)
Neutrophils Relative %: 66.2 % (ref 43.0–77.0)
Platelets: 230 10*3/uL (ref 150.0–400.0)
RBC: 4.16 Mil/uL (ref 3.87–5.11)
RDW: 16.2 % — ABNORMAL HIGH (ref 11.5–14.6)
WBC: 9.7 10*3/uL (ref 4.5–10.5)

## 2013-07-19 LAB — BASIC METABOLIC PANEL
BUN: 12 mg/dL (ref 6–23)
CO2: 29 mEq/L (ref 19–32)
Calcium: 9.6 mg/dL (ref 8.4–10.5)
Chloride: 97 mEq/L (ref 96–112)
Creatinine, Ser: 0.7 mg/dL (ref 0.4–1.2)
GFR: 113.05 mL/min (ref >60.00)
Glucose, Bld: 262 mg/dL — ABNORMAL HIGH (ref 70–99)
Potassium: 3.6 mEq/L (ref 3.5–5.1)
Sodium: 136 mEq/L (ref 135–145)

## 2013-07-19 LAB — BRAIN NATRIURETIC PEPTIDE: Pro B Natriuretic peptide (BNP): 13 pg/mL (ref 0.0–100.0)

## 2013-07-19 MED ORDER — METOPROLOL SUCCINATE ER 50 MG PO TB24: 75.0000 mg | ORAL_TABLET | Freq: Every day | ORAL | Status: DC

## 2013-07-19 NOTE — Progress Notes (Signed)
HPI Patient is a 62 year old with a history of HTN and HL and sleep apnea. Normal myoview in past. Cath in 2005 with normal coronary arteries. I saw her in March 2014 Denies CP  Breathing is OK    Allergies  Allergen Reactions  . Amlodipine Besylate Swelling  . Lisinopril-Hydrochlorothiazide Cough    REACTION: cough-- changed to arb  . Propoxyphene Hcl Itching  . Valsartan Other (See Comments)    REACTION:  "sleep more the next day after I took it; it made me real tired"  . Metronidazole     REACTION: "just didn't work; my body never did heal from it"    Current Outpatient Prescriptions  Medication Sig Dispense Refill  . acyclovir (ZOVIRAX) 200 MG capsule Take 2 capsules (400 mg total) by mouth 2 (two) times daily.  120 capsule  12  . albuterol (PROVENTIL HFA;VENTOLIN HFA) 108 (90 BASE) MCG/ACT inhaler Inhale 2 puffs into the lungs every 4 (four) hours as needed. For shortness of breath  1 Inhaler  11  . allopurinol (ZYLOPRIM) 300 MG tablet TAKE ONE (1) TABLET EACH DAY  30 tablet  10  . ammonium lactate (LAC-HYDRIN) 12 % lotion APPLY TO BOTTOM OF LEFT FOOT 3 TO 4 TIMES DAILY AS NEEDED FOR DRY SKIN  400 g  5  . arformoterol (BROVANA) 15 MCG/2ML NEBU Take 2 mLs (15 mcg total) by nebulization 2 (two) times daily as needed. For shortness of breath  120 mL  11  . aspirin EC 81 MG tablet Take 81 mg by mouth daily.       [name] atorvastatin (LIPITOR) 20 MG tablet TAKE ONE (1) TABLET EACH DAY  90 tablet  1  . azelastine (ASTELIN) 137 MCG/SPRAY nasal spray PLACE 2 SPRAYS INTO THE NOSE DAILY AS   NEEDED USE IN EACH NOSTRIL ASDIRECTED  30 mL  12  . baclofen (LIORESAL) 10 MG tablet Take one half or one tab at night prn neck pain  30 each  1  . beclomethasone (QVAR) 80 MCG/ACT inhaler Inhale 2 puffs into the lungs as needed. For allergies      . brimonidine (ALPHAGAN P) 0.1 % SOLN Place 1 drop into the left eye 2 (two) times daily.        . Cholecalciferol 50000 UNITS capsule Take one by mouth weekly  12  capsule  3  . docusate sodium (COLACE) 100 MG capsule Take 100 mg by mouth 2 (two) times daily as needed. For constipation      . EPINEPHrine (EPI-PEN) 0.3 mg/0.3 mL SOAJ Inject 0.3 mLs (0.3 mg total) into the muscle once.  1 Device  11  . esomeprazole (NEXIUM) 40 MG capsule Take 1 capsule (40 mg total) by mouth 2 (two) times daily.  180 capsule  3  . furosemide (LASIX) 20 MG tablet TAKE ONE (1) TABLET EACH DAY  90 tablet  3  . gabapentin (NEURONTIN) 300 MG capsule 300 mg. AS NEEDED FOR HEADACHES      . hydrochlorothiazide (MICROZIDE) 12.5 MG capsule Take 1 capsule (12.5 mg total) by mouth daily.  90 capsule  3  . HYDROcodone-acetaminophen (NORCO/VICODIN) 5-325 MG per tablet Take one half or one tablet every 8 hours as needed for neck pain  60 tablet  0  . insulin glargine (LANTUS SOLOSTAR) 100 UNIT/ML injection Inject 40 units subcutaneously daily Generic OK  15 mL  12  . Insulin Pen Needle 30G X 5 MM MISC by Does not apply route.      [name]  latanoprost (XALATAN) 0.005 % ophthalmic solution PLACE 1 DROP INTO BOTH EYES AT BEDTIME  2.5 mL  6  . losartan (COZAAR) 100 MG tablet TAKE 1 TABLET BY MOUTH EVERY DAY  30 tablet  3  . metFORMIN (GLUCOPHAGE) 1000 MG tablet Take 1.5 tablet in the morning and 1 tablet in the evening  75 tablet  12  . metoprolol succinate (TOPROL-XL) 50 MG 24 hr tablet Take one tablet daily  270 tablet  3  . montelukast (SINGULAIR) 10 MG tablet Take 1 tablet (10 mg total) by mouth daily.  90 tablet  3  . nitroGLYCERIN (NITROSTAT) 0.4 MG SL tablet Place 0.4 mg under the tongue every 5 (five) minutes as needed. For chest pain      . polyethylene glycol (MIRALAX / GLYCOLAX) packet Take 17 g by mouth daily as needed. For constipation      . sertraline (ZOLOFT) 100 MG tablet Take 1.5 tablets (150 mg total) by mouth at bedtime.  135 tablet  3   No current facility-administered medications for this visit.    Past Medical History  Diagnosis Date  . Chronic cough     Now followed by  pulmonary  . Sleep apnea     on CPAP 12  . HTN (hypertension)   . Dyslipidemia   . Gout   . History of colonoscopy   . Concussion 11/18/11    "fell at dr's office; hit head"  . Complication of anesthesia     "just wake up coughing; that's all"  . Heart murmur   . Asthma   . COPD (chronic obstructive pulmonary disease)   . Diabetes mellitus type II     "take insulin & pills"  . Blood transfusion   . GERD (gastroesophageal reflux disease)   . Arthritis   . Chronic lower back pain   . Vocal cord paralysis, unilateral partial     "right"  . Depression   . Peripheral neuropathy   . Angina   . Pneumonia     "i've had it once" (11/18/11)  . Exertional dyspnea   . Headache(784.0) 11/18/11    "I've had mild headaches the last couple days"  . Headache(784.0) 01/08/12    "pretty constant since 11/18/11's concussion"  . Syncope 06/16/2012    Past Surgical History  Procedure Laterality Date  . Colonoscopy w/ biopsies  11/21/2010    adenoma polyp--no hi grade dysplasia Dr [address]  . Treatment fistula anal    . Breast reduction surgery  04-21-2003  . Tubal ligation  1987  . Cardiac catheterization  03-02-2004  . Bronchoscopy  07-2007  . Knee arthroscopy  08/2000    right  . Shoulder arthroscopy  08/2000    right  . T-score  09/02/04    -0.54 (low risk currently)  . Total abdominal hysterectomy  10-07-2000  . Cesarean section  1987  . Shoulder arthroscopy w/ rotator cuff repair  10/18/2003    left  . Cataract extraction  1955; 1979    bilateral; right eye  . Eye surgery    . Breast biopsy      bilaterally  . Breast cyst excision      right twice; left once  . Carpal tunnel release      left  . Anterior cervical decomp/discectomy fusion  01/15/2012    Procedure: ANTERIOR CERVICAL DECOMPRESSION/DISCECTOMY FUSION 3 LEVELS;  Surgeon: Tia Alert, MD;  Location: [facility];  Service: Neurosurgery;  Laterality: N/A;  Anterior Cervical Decompression Discectomy  Fusion Cerivcal three four,  cervical four five, cervical five six, cervical six seven    Family History  Problem Relation Age of Onset  . Diabetes Mother   . Colon cancer Neg Hx     History   Social History  . Marital Status: Single    Spouse Name: N/A    Number of Children: 1  . Years of Education: 12   Occupational History  . unemployed    Social History Main Topics  . Smoking status: Former Smoker -- 0.50 packs/day for .5 years    Types: Cigarettes    Quit date: 09/03/1975  . Smokeless tobacco: Never Used  . Alcohol Use: No  . Drug Use: No  . Sexual Activity: Not Currently   Other Topics Concern  . Not on file   Social History Narrative   Approved for section 8 housing (12/03).         Health Care POA:    Emergency Contact: sister, catherine Sklar [phone]   End of Life Plan:    Who lives with you: Has a son who may be autistic and stays with her at times.   Any pets: none   Diet: Pt has a varied diet of protein, starch, and vegetables.   Exercise: Pt walks 1x a week with [place] [employer] [facility] [employer].   Seatbelts: Pt reports wearing seatbelt when in vehicles.    Hobbies: walking, tv, church, eating          Review of Systems:  All systems reviewed.  They are negative to the above problem except as previously stated.  Vital Signs: BP 142/82  Pulse 74  Ht 5\' 1"  (1.549 m)  Wt 188 lb 1.9 oz (85.331 kg)  BMI 35.56 kg/m2  Physical Exam Patient is in NAD HEENT:  Normocephalic, atraumatic. EOMI, PERRLA.  Neck: JVP is normal.  No bruits.  Lungs: clear to auscultation. No rales no wheezes.  Heart: Regular rate and rhythm. Normal S1, S2. No S3.   No significant murmurs. PMI not displaced.  Abdomen:  Supple, nontender. Normal bowel sounds. No masses. No hepatomegaly.  Extremities:   Good distal pulses throughout. Tr lower extremity edema.  Musculoskeletal :moving all extremities.  Neuro:   alert and oriented x3.  CN II-XII grossly intact.  EKG;  SR  LAD  LAA  74  bpm Assessment and Plan:  1.  HTN  BP is better  Up a little at times but average is about 140s/ She can take 1 1/2 of toprol  Follow BP    2.  HL  Continue lipitor.  Will check BMET, BNP and CBC    F/U in 6 months.

## 2013-07-19 NOTE — Patient Instructions (Signed)
Your physician has recommended you make the following change in your medication:   1. Increase Metoprolol to 75 mg (1 1/2 tablet) once daily.  Your physician recommends that you have labs today: BMET, BNP, CBC  Your physician wants you to follow-up in: 6 months with Dr. Tenny Craw. You will receive a reminder letter in the mail two months in advance. If you don't receive a letter, please call our office to schedule the follow-up appointment.

## 2013-07-22 ENCOUNTER — Ambulatory Visit (INDEPENDENT_AMBULATORY_CARE_PROVIDER_SITE_OTHER): Payer: Medicare Other

## 2013-07-22 DIAGNOSIS — J309 Allergic rhinitis, unspecified: Secondary | ICD-10-CM

## 2013-07-23 ENCOUNTER — Telehealth: Payer: Self-pay | Admitting: Internal Medicine

## 2013-07-23 NOTE — Telephone Encounter (Signed)
New Problem:  Pt states she is calling for recent lab results. Pt states it is ok to leave a voice mail.

## 2013-07-23 NOTE — Telephone Encounter (Signed)
Spoke with pt, aware of lab results. Forwarded to dr Jennette Kettle

## 2013-08-06 ENCOUNTER — Ambulatory Visit (INDEPENDENT_AMBULATORY_CARE_PROVIDER_SITE_OTHER): Payer: Medicare Other

## 2013-08-06 DIAGNOSIS — J309 Allergic rhinitis, unspecified: Secondary | ICD-10-CM

## 2013-08-12 ENCOUNTER — Ambulatory Visit (INDEPENDENT_AMBULATORY_CARE_PROVIDER_SITE_OTHER): Payer: Medicare Other

## 2013-08-12 DIAGNOSIS — J309 Allergic rhinitis, unspecified: Secondary | ICD-10-CM

## 2013-08-19 ENCOUNTER — Ambulatory Visit: Payer: Medicare HMO

## 2013-08-20 ENCOUNTER — Ambulatory Visit (INDEPENDENT_AMBULATORY_CARE_PROVIDER_SITE_OTHER): Payer: Medicare Other

## 2013-08-20 DIAGNOSIS — J309 Allergic rhinitis, unspecified: Secondary | ICD-10-CM

## 2013-08-27 ENCOUNTER — Telehealth: Payer: Self-pay | Admitting: Internal Medicine

## 2013-08-27 ENCOUNTER — Ambulatory Visit (INDEPENDENT_AMBULATORY_CARE_PROVIDER_SITE_OTHER): Payer: Medicare Other

## 2013-08-27 DIAGNOSIS — J309 Allergic rhinitis, unspecified: Secondary | ICD-10-CM

## 2013-08-27 MED ORDER — BENZONATATE 200 MG PO CAPS: 200.0000 mg | ORAL_CAPSULE | Freq: Three times a day (TID) | ORAL | Status: DC | PRN

## 2013-08-27 NOTE — Telephone Encounter (Signed)
Spoke with pt - c/o hacky cough, wheezing qhs, and "a little" increased SOB x 2 days.  No chest tightness, fever, or body aches.  Is using albuterol hfa with not much relief.  Is staying at sister's house - doesn't have neb machine there.  Requesting rx for cough - [facility] [address]  Spoke with Dr. Maple Hudson.  Ok to give tesselon pearls 200 mg #30 take 1 q8h prn cough no refills.  Pt to call back if this doesn't help over the weekend.  Spoke with pt. Informed her of above recs per Dr. Maple Hudson.  She verbalized understanding of this, is aware [facility], and will call back if this doesn't help.

## 2013-08-30 ENCOUNTER — Telehealth: Payer: Self-pay | Admitting: Internal Medicine

## 2013-08-30 ENCOUNTER — Other Ambulatory Visit: Payer: Self-pay | Admitting: Family Medicine

## 2013-08-30 MED ORDER — OSELTAMIVIR PHOSPHATE 75 MG PO CAPS: 75.0000 mg | ORAL_CAPSULE | Freq: Two times a day (BID) | ORAL | Status: DC

## 2013-08-30 NOTE — Telephone Encounter (Signed)
Offer Tamiflu 75 mg, # 10, 1 twice daily 

## 2013-08-30 NOTE — Telephone Encounter (Signed)
Called, spoke with pt.  C/o nonprod cough, HA, body aches, increased SOB, wheezing, and chest tightness with cough.  No fever.  Occas chills.  Symptoms started x 3 days ago.  Tessalon Pearles were sent in on 12/26.  Pt isn't getting any relief with this.  Requesting further recs.  Dr. Maple Hudson, pls advise.  Thank you.  Last OV with CDY: 06/14/13  [facility] [address]  Allergies verified with pt: Allergies  Allergen Reactions  . Amlodipine Besylate Swelling  . Lisinopril-Hydrochlorothiazide Cough    REACTION: cough-- changed to arb  . Propoxyphene Hcl Itching  . Valsartan Other (See Comments)    REACTION:  "sleep more the next day after I took it; it made me real tired"  . Metronidazole     REACTION: "just didn't work; my body never did heal from it"

## 2013-08-30 NOTE — Telephone Encounter (Signed)
I called and spoke with pt. Aware of recs, nothing further needed

## 2013-09-01 ENCOUNTER — Other Ambulatory Visit: Payer: Self-pay | Admitting: Family Medicine

## 2013-09-03 ENCOUNTER — Ambulatory Visit: Payer: Medicare Other

## 2013-09-06 ENCOUNTER — Telehealth: Payer: Self-pay | Admitting: *Deleted

## 2013-09-06 ENCOUNTER — Telehealth: Payer: Self-pay | Admitting: Internal Medicine

## 2013-09-06 MED ORDER — AZITHROMYCIN 250 MG PO TABS: ORAL_TABLET | ORAL | Status: DC

## 2013-09-06 NOTE — Telephone Encounter (Signed)
Called and spoke with pt and she is aware of CY recs and that she will need to take the zpak, mucinex dm and the tessalon.  Pt voiced her understanding.

## 2013-09-06 NOTE — Telephone Encounter (Signed)
Offer Z pak and suggest she add Mucinex DM to the Tessalon- both will help cough

## 2013-09-06 NOTE — Telephone Encounter (Signed)
Pt finished Tamiflu yesterday.  Still c/o non prod cough, wheezing, sob with cough, pulling sensation in chest with cough.  Still taking Tessalon Perles.  Please advise.  Last seen 07/01/13 Allergies  Allergen Reactions  . Amlodipine Besylate Swelling  . Lisinopril-Hydrochlorothiazide Cough    REACTION: cough-- changed to arb  . Propoxyphene Hcl Itching  . Valsartan Other (See Comments)    REACTION:  "sleep more the next day after I took it; it made me real tired"  . Metronidazole     REACTION: "just didn't work; my body never did heal from it"   Current Outpatient Prescriptions on File Prior to Visit  Medication Sig Dispense Refill  . acyclovir (ZOVIRAX) 200 MG capsule Take 2 capsules (400 mg total) by mouth 2 (two) times daily.  120 capsule  12  . albuterol (PROVENTIL HFA;VENTOLIN HFA) 108 (90 BASE) MCG/ACT inhaler Inhale 2 puffs into the lungs every 4 (four) hours as needed. For shortness of breath  1 Inhaler  11  . allopurinol (ZYLOPRIM) 300 MG tablet TAKE ONE (1) TABLET EACH DAY  30 tablet  10  . ammonium lactate (LAC-HYDRIN) 12 % lotion APPLY TO BOTTOM OF LEFT FOOT 3 TO 4 TIMES DAILY AS NEEDED FOR DRY SKIN  400 g  5  . arformoterol (BROVANA) 15 MCG/2ML NEBU Take 2 mLs (15 mcg total) by nebulization 2 (two) times daily as needed. For shortness of breath  120 mL  11  . aspirin EC 81 MG tablet Take 81 mg by mouth daily.       [name] atorvastatin (LIPITOR) 20 MG tablet TAKE ONE (1) TABLET EACH DAY  90 tablet  1  . azelastine (ASTELIN) 137 MCG/SPRAY nasal spray PLACE 2 SPRAYS INTO THE NOSE DAILY AS   NEEDED USE IN EACH NOSTRIL ASDIRECTED  30 mL  12  . baclofen (LIORESAL) 10 MG tablet Take one half or one tab at night prn neck pain  30 each  1  . benzonatate (TESSALON) 200 MG capsule Take 1 capsule (200 mg total) by mouth every 8 (eight) hours as needed for cough.  30 capsule  0  . brimonidine (ALPHAGAN P) 0.1 % SOLN Place 1 drop into the left eye 2 (two) times daily.        . Cholecalciferol  50000 UNITS capsule Take one by mouth weekly  12 capsule  3  . docusate sodium (COLACE) 100 MG capsule Take 100 mg by mouth 2 (two) times daily as needed. For constipation      . EPINEPHrine (EPI-PEN) 0.3 mg/0.3 mL SOAJ Inject 0.3 mLs (0.3 mg total) into the muscle once.  1 Device  11  . esomeprazole (NEXIUM) 40 MG capsule Take 1 capsule (40 mg total) by mouth 2 (two) times daily.  180 capsule  3  . furosemide (LASIX) 20 MG tablet TAKE ONE (1) TABLET EACH DAY  90 tablet  3  . gabapentin (NEURONTIN) 300 MG capsule 300 mg. AS NEEDED FOR HEADACHES      . HYDROcodone-acetaminophen (NORCO/VICODIN) 5-325 MG per tablet Take one half or one tablet every 8 hours as needed for neck pain  60 tablet  0  . insulin glargine (LANTUS SOLOSTAR) 100 UNIT/ML injection Inject 40 units subcutaneously daily Generic OK  15 mL  12  . Insulin Pen Needle 30G X 5 MM MISC by Does not apply route.      [name] LANTUS SOLOSTAR 100 UNIT/ML SOPN INJECT 40 UNITS SUBCUTANEOUSLY DAILY  5 pen  12  .  latanoprost (XALATAN) 0.005 % ophthalmic solution PLACE 1 DROP INTO BOTH EYES AT BEDTIME  2.5 mL  6  . losartan (COZAAR) 100 MG tablet TAKE 1 TABLET BY MOUTH EVERY DAY  30 tablet  3  . metFORMIN (GLUCOPHAGE) 1000 MG tablet Take 1.5 tablet in the morning and 1 tablet in the evening  75 tablet  12  . metoprolol succinate (TOPROL-XL) 50 MG 24 hr tablet Take 2 tablets (100 mg total) by mouth daily. Take one tablet daily  45 tablet  5  . montelukast (SINGULAIR) 10 MG tablet Take 1 tablet (10 mg total) by mouth daily.  90 tablet  3  . nitroGLYCERIN (NITROSTAT) 0.4 MG SL tablet Place 0.4 mg under the tongue every 5 (five) minutes as needed. For chest pain      . oseltamivir (TAMIFLU) 75 MG capsule Take 1 capsule (75 mg total) by mouth 2 (two) times daily.  10 capsule  0  . polyethylene glycol (MIRALAX / GLYCOLAX) packet Take 17 g by mouth daily as needed. For constipation      . QVAR 80 MCG/ACT inhaler INHALE 1 PUFF INTO THE LUNGS AS NEEDED FOR  WHEEZING.  8.7 g  10  . sertraline (ZOLOFT) 100 MG tablet Take 1.5 tablets (150 mg total) by mouth at bedtime.  135 tablet  3   No current facility-administered medications on file prior to visit.

## 2013-09-06 NOTE — Telephone Encounter (Signed)
Refill on HCTZ and baclofen. [name], [name]

## 2013-09-07 MED ORDER — BACLOFEN 10 MG PO TABS: ORAL_TABLET | ORAL | Status: DC

## 2013-09-07 MED ORDER — HYDROCHLOROTHIAZIDE 12.5 MG PO CAPS: 12.5000 mg | ORAL_CAPSULE | Freq: Every day | ORAL | Status: DC

## 2013-09-16 ENCOUNTER — Telehealth: Payer: Self-pay | Admitting: Internal Medicine

## 2013-09-16 ENCOUNTER — Ambulatory Visit (INDEPENDENT_AMBULATORY_CARE_PROVIDER_SITE_OTHER): Payer: Medicare Other

## 2013-09-16 DIAGNOSIS — J309 Allergic rhinitis, unspecified: Secondary | ICD-10-CM

## 2013-09-16 MED ORDER — PROMETHAZINE-CODEINE 6.25-10 MG/5ML PO SYRP: 5.0000 mL | ORAL_SOLUTION | Freq: Four times a day (QID) | ORAL | Status: DC | PRN

## 2013-09-16 NOTE — Telephone Encounter (Signed)
Spoke with patient;aware of cough syrup Rx. I have called to pharmacy-gave verbal Rx.

## 2013-09-16 NOTE — Telephone Encounter (Signed)
Spoke with patient-she states that her Tessalon pearl Rx from [facility] costs too much; she is requesting to have a cough syrup Rx called in. Pt does not drive and will be unable to pick up RX. [facility] Please advise. Thanks.

## 2013-09-16 NOTE — Telephone Encounter (Signed)
We can call in script for prometh codeine cough syrup 200 ml, 1 tsp every 6 hours if needed for cough ----if she has a way to get it from drug store.

## 2013-09-16 NOTE — Telephone Encounter (Signed)
lmomtcb for pt 

## 2013-09-16 NOTE — Telephone Encounter (Signed)
Patient returning call.

## 2013-09-17 ENCOUNTER — Ambulatory Visit (INDEPENDENT_AMBULATORY_CARE_PROVIDER_SITE_OTHER): Payer: Medicare Other

## 2013-09-17 DIAGNOSIS — J309 Allergic rhinitis, unspecified: Secondary | ICD-10-CM

## 2013-09-23 ENCOUNTER — Ambulatory Visit (INDEPENDENT_AMBULATORY_CARE_PROVIDER_SITE_OTHER): Payer: Medicare Other

## 2013-09-23 ENCOUNTER — Telehealth: Payer: Self-pay | Admitting: Internal Medicine

## 2013-09-23 DIAGNOSIS — J309 Allergic rhinitis, unspecified: Secondary | ICD-10-CM

## 2013-09-23 DIAGNOSIS — J449 Chronic obstructive pulmonary disease, unspecified: Secondary | ICD-10-CM

## 2013-09-23 DIAGNOSIS — G4733 Obstructive sleep apnea (adult) (pediatric): Secondary | ICD-10-CM

## 2013-09-23 NOTE — Telephone Encounter (Signed)
Pt came in today for her weekly allergy vaccine. Her insurance will be changing at the beginning of February. DME companies for CPAP and neb meds will have to be changed to [place].  Will route message to [facility] per her request.

## 2013-09-30 ENCOUNTER — Ambulatory Visit (INDEPENDENT_AMBULATORY_CARE_PROVIDER_SITE_OTHER): Payer: Medicare Other

## 2013-09-30 DIAGNOSIS — J309 Allergic rhinitis, unspecified: Secondary | ICD-10-CM

## 2013-09-30 MED ORDER — ARFORMOTEROL TARTRATE 15 MCG/2ML IN NEBU: 15.0000 ug | INHALATION_SOLUTION | Freq: Two times a day (BID) | RESPIRATORY_TRACT | Status: DC | PRN

## 2013-09-30 NOTE — Telephone Encounter (Signed)
Spoke with Rhonda-okay to place 1 order to change patient for CPAP and neb meds. I have printed the Rx's and [facility] has signed. Gave Rx's to [place].

## 2013-10-06 ENCOUNTER — Encounter: Payer: Self-pay | Admitting: Internal Medicine

## 2013-10-07 ENCOUNTER — Ambulatory Visit (INDEPENDENT_AMBULATORY_CARE_PROVIDER_SITE_OTHER): Payer: Commercial Managed Care - HMO

## 2013-10-07 ENCOUNTER — Telehealth: Payer: Self-pay | Admitting: Family Medicine

## 2013-10-07 DIAGNOSIS — J309 Allergic rhinitis, unspecified: Secondary | ICD-10-CM

## 2013-10-07 NOTE — Telephone Encounter (Signed)
[name] [name] from Office of Dr. [name] is requesting a [name] referral for patient. Patient receives weekly allergy injections at the office so they ask for multiple visits. Patient's diagnosis code 477.9 -- Allergic rhinitis. Dr. Janee Morn NPI is [id number]. Called with any additional info needed. [phone] ([phone] (F).

## 2013-10-11 NOTE — Telephone Encounter (Signed)
Referral put in workqueue

## 2013-10-11 NOTE — Telephone Encounter (Signed)
[facility] form faxed.  [place]

## 2013-10-11 NOTE — Telephone Encounter (Signed)
Pt will need a referral from PCP in order to process [facility] referral.  [place]

## 2013-10-14 ENCOUNTER — Ambulatory Visit (INDEPENDENT_AMBULATORY_CARE_PROVIDER_SITE_OTHER): Payer: Medicare HMO

## 2013-10-14 DIAGNOSIS — J309 Allergic rhinitis, unspecified: Secondary | ICD-10-CM

## 2013-10-20 ENCOUNTER — Ambulatory Visit (INDEPENDENT_AMBULATORY_CARE_PROVIDER_SITE_OTHER): Payer: Commercial Managed Care - HMO | Admitting: Family Medicine

## 2013-10-20 VITALS — BP 101/73 | HR 96 | Temp 98.1°F | Wt 181.3 lb

## 2013-10-20 DIAGNOSIS — E119 Type 2 diabetes mellitus without complications: Secondary | ICD-10-CM

## 2013-10-20 DIAGNOSIS — G609 Hereditary and idiopathic neuropathy, unspecified: Secondary | ICD-10-CM

## 2013-10-20 DIAGNOSIS — Z23 Encounter for immunization: Secondary | ICD-10-CM

## 2013-10-20 DIAGNOSIS — J209 Acute bronchitis, unspecified: Secondary | ICD-10-CM

## 2013-10-20 LAB — POCT GLYCOSYLATED HEMOGLOBIN (HGB A1C): Hemoglobin A1C: 7.8

## 2013-10-20 MED ORDER — SULFAMETHOXAZOLE-TMP DS 800-160 MG PO TABS: 1.0000 | ORAL_TABLET | Freq: Two times a day (BID) | ORAL | Status: DC

## 2013-10-21 ENCOUNTER — Ambulatory Visit: Payer: Medicare HMO

## 2013-10-21 ENCOUNTER — Telehealth: Payer: Self-pay | Admitting: Internal Medicine

## 2013-10-21 MED ORDER — GABAPENTIN 300 MG PO CAPS: ORAL_CAPSULE | ORAL | Status: DC

## 2013-10-21 NOTE — Progress Notes (Signed)
   Subjective:    Patient ID: [name] [name], female    DOB: [birth year], 63 y.o.   MRN: [id number]  HPI  Productive cough, feeling fatigue, mild fever. She was treated couple weeks ago by another doctor for bronchitis with some Keflex. She got totally better and then started having same symptoms returned about 4 days ago. She's also had a lot of nasal congestion and facial tenderness and pain. No sore throat. Feels like she is wheezing some the time. #2. Diabetes mellitus. She needs her A1c done today. #3. She is transferring her medicines to a new pharmacy and needs all her prescriptions sent. #4. Wants to discuss her gabapentin dose. She's taking a total of 400 mg at 9 she got to go up.  Review of Systems She's had no chills, she thinks she may have had some fever. No unusual weight change. See history of present illness above. She's also had some mild headache.    Objective:   Physical Exam Vital signs are reviewed GENERAL: Well-developed female no acute distress LUNGS: Expiratory wheeze in the right base otherwise clear to auscultation. CV: Regular rate and rhythm OROPHARYNX: Mild erythema but no exudate neck is without lymphadenopathy. HEENT: Tender bilaterally over the maxillary sinuses particularly on the left. TMs bilaterally are retracted but had normal landmarks.       Assessment & Plan:  Probably recurrence of viral bronchitis we'll go ahead and treat her with Bactrim. This will also cover her sinuses. #2Burnis Medin get her A1c drawn today. #3. We'll increase her gabapentin to 600 mg at night. I will also review all of her prescriptions and send them to the new pharmacy.  The she's not improving from her bronchitis she'll, and otherwise I'll see her back for regular followup in one to 3 months and when necessary if she notes.

## 2013-10-21 NOTE — Telephone Encounter (Signed)
Error Msg taken on wrong pt

## 2013-10-21 NOTE — Telephone Encounter (Signed)
Received call from [facility] refill on brovana x 4 rf  Nothing further needed

## 2013-10-22 ENCOUNTER — Telehealth: Payer: Self-pay | Admitting: Internal Medicine

## 2013-10-22 MED ORDER — ALBUTEROL SULFATE (2.5 MG/3ML) 0.083% IN NEBU: 2.5000 mg | INHALATION_SOLUTION | Freq: Four times a day (QID) | RESPIRATORY_TRACT | Status: DC | PRN

## 2013-10-22 NOTE — Telephone Encounter (Signed)
Pt is aware of medication change. This has been sent to [place]. Nothing further is needed.

## 2013-10-22 NOTE — Telephone Encounter (Signed)
Per [facility]-stick with albuterol 0.083% every 4-6 hours prn. Thanks

## 2013-10-22 NOTE — Telephone Encounter (Signed)
Called spoke with pt. She reports the brovana is going to cost her over $100 through [facility] per month. She can't afford this. She wants to know if there is an alternative. Please advise Dr. Annamaria Boots thanks  Allergies  Allergen Reactions  . Amlodipine Besylate Swelling  . Lisinopril-Hydrochlorothiazide Cough    REACTION: cough-- changed to arb  . Propoxyphene Hcl Itching  . Valsartan Other (See Comments)    REACTION:  "sleep more the next day after I took it; it made me real tired"  . Metronidazole     REACTION: "just didn't work; my body never did heal from it"

## 2013-10-25 NOTE — Addendum Note (Signed)
Addended by: Enid Skeens K on: 10/25/2013 10:40 AM   Modules accepted: Orders

## 2013-10-28 ENCOUNTER — Ambulatory Visit: Payer: Medicare HMO

## 2013-11-02 ENCOUNTER — Telehealth: Payer: Self-pay | Admitting: Family Medicine

## 2013-11-02 NOTE — Telephone Encounter (Signed)
Dr Nori Riis I called [facility] and got there fax#[phone]. Salem Lembke, Lewie Loron

## 2013-11-02 NOTE — Telephone Encounter (Signed)
[name] [name] is waiting for you to send the info requested to [facility] regarding her medication.  Please call her when you have contacted them.

## 2013-11-02 NOTE — Telephone Encounter (Signed)
Dear [name] Team OK--I see the problem--the [facility] I had in teh computer is not sending electronically---I will have to fax---see if you  Can get [facility] fax number from her---I thought it was already done so I got rid of my copy. Tell her muco sorry THANKS! [name] Let me know and I will fax again today [facility]! [name]

## 2013-11-05 ENCOUNTER — Ambulatory Visit (INDEPENDENT_AMBULATORY_CARE_PROVIDER_SITE_OTHER): Payer: Medicare HMO

## 2013-11-05 ENCOUNTER — Other Ambulatory Visit: Payer: Self-pay | Admitting: *Deleted

## 2013-11-05 DIAGNOSIS — J309 Allergic rhinitis, unspecified: Secondary | ICD-10-CM

## 2013-11-09 MED ORDER — BECLOMETHASONE DIPROPIONATE 80 MCG/ACT IN AERS: INHALATION_SPRAY | RESPIRATORY_TRACT | Status: DC

## 2013-11-09 MED ORDER — LATANOPROST 0.005 % OP SOLN: OPHTHALMIC | Status: DC

## 2013-11-09 NOTE — Telephone Encounter (Signed)
Dear [name] Team please fax to [facility]

## 2013-11-10 ENCOUNTER — Telehealth: Payer: Self-pay | Admitting: Family Medicine

## 2013-11-10 NOTE — Telephone Encounter (Signed)
Pt called and would like for Dr. Nori Riis to call her so that they can talk about [facility]. jw

## 2013-11-10 NOTE — Telephone Encounter (Signed)
Dr Nori Riis unable to print RX .per Madlyn Frankel we don't have that privilege.if you could please print RX so I  could fax them.thank you.Milam Allbaugh, Lewie Loron

## 2013-11-11 ENCOUNTER — Telehealth: Payer: Self-pay | Admitting: Internal Medicine

## 2013-11-11 ENCOUNTER — Ambulatory Visit (INDEPENDENT_AMBULATORY_CARE_PROVIDER_SITE_OTHER): Payer: Medicare HMO

## 2013-11-11 DIAGNOSIS — J309 Allergic rhinitis, unspecified: Secondary | ICD-10-CM

## 2013-11-11 NOTE — Telephone Encounter (Signed)
LMTCB

## 2013-11-11 NOTE — Telephone Encounter (Signed)
Dr Narda Rutherford with [name].She would like you to wait until you received a request of medication list from [facility].They told her they faxed request today.The reason is that she doesn't want all medications filled at [facility]. thank you.Ezriel Boffa, Lewie Loron

## 2013-11-11 NOTE — Telephone Encounter (Signed)
Ok to send albuterol neb solution # 120, 1 every 6 hours if needed, refill prn

## 2013-11-11 NOTE — Telephone Encounter (Signed)
Called spoke with pt. She is requesting RX for her albuterol neb to be faxed to [facility] for 30 day supply. RX was sent to [facility] for only #30 vials per pt. This is not enough for 1 month. Dr. Annamaria Boots is it okay to send new RX for increased amount of albuterol neb? thanks

## 2013-11-12 ENCOUNTER — Telehealth: Payer: Self-pay | Admitting: Internal Medicine

## 2013-11-12 MED ORDER — ALBUTEROL SULFATE (2.5 MG/3ML) 0.083% IN NEBU: 2.5000 mg | INHALATION_SOLUTION | Freq: Four times a day (QID) | RESPIRATORY_TRACT | Status: DC | PRN

## 2013-11-12 NOTE — Telephone Encounter (Signed)
Pt advised, Rx printed and singed by [facility] and given to [facility] to send to [facility]. Weston Bing, CMA

## 2013-11-12 NOTE — Telephone Encounter (Signed)
I called and spoke with [name] [name] from [place]. Gave VO for pt to have 120 vials of albuterol. Advised Dr. Annamaria Boots did ok this. Advised him brovana was d/c'd bc pt could not afford this. Nothing further needed

## 2013-11-15 ENCOUNTER — Other Ambulatory Visit: Payer: Self-pay | Admitting: Family Medicine

## 2013-11-17 ENCOUNTER — Other Ambulatory Visit: Payer: Self-pay | Admitting: Internal Medicine

## 2013-11-17 ENCOUNTER — Other Ambulatory Visit: Payer: Self-pay | Admitting: *Deleted

## 2013-11-18 ENCOUNTER — Ambulatory Visit (INDEPENDENT_AMBULATORY_CARE_PROVIDER_SITE_OTHER): Payer: Medicare HMO

## 2013-11-18 DIAGNOSIS — J309 Allergic rhinitis, unspecified: Secondary | ICD-10-CM

## 2013-11-22 ENCOUNTER — Other Ambulatory Visit: Payer: Self-pay | Admitting: *Deleted

## 2013-11-22 MED ORDER — ATORVASTATIN CALCIUM 20 MG PO TABS
ORAL_TABLET | ORAL | Status: DC
Start: 2013-11-22 — End: 2013-11-23

## 2013-11-22 MED ORDER — ALBUTEROL SULFATE HFA 108 (90 BASE) MCG/ACT IN AERS: 2.0000 | INHALATION_SPRAY | RESPIRATORY_TRACT | Status: DC | PRN

## 2013-11-22 MED ORDER — AZELASTINE HCL 0.1 % NA SOLN: NASAL | Status: DC

## 2013-11-22 MED ORDER — SERTRALINE HCL 100 MG PO TABS: 150.0000 mg | ORAL_TABLET | Freq: Every day | ORAL | Status: DC

## 2013-11-22 MED ORDER — INSULIN GLARGINE 100 UNIT/ML SOLOSTAR PEN: PEN_INJECTOR | SUBCUTANEOUS | Status: DC

## 2013-11-22 MED ORDER — METFORMIN HCL 1000 MG PO TABS: ORAL_TABLET | ORAL | Status: DC

## 2013-11-22 MED ORDER — GABAPENTIN 300 MG PO CAPS: ORAL_CAPSULE | ORAL | Status: DC

## 2013-11-22 MED ORDER — METOPROLOL SUCCINATE ER 50 MG PO TB24: ORAL_TABLET | ORAL | Status: DC

## 2013-11-22 MED ORDER — ESOMEPRAZOLE MAGNESIUM 40 MG PO CPDR: 40.0000 mg | DELAYED_RELEASE_CAPSULE | Freq: Two times a day (BID) | ORAL | Status: DC

## 2013-11-22 MED ORDER — FUROSEMIDE 20 MG PO TABS: ORAL_TABLET | ORAL | Status: DC

## 2013-11-22 MED ORDER — ALLOPURINOL 300 MG PO TABS: ORAL_TABLET | ORAL | Status: DC

## 2013-11-22 MED ORDER — MONTELUKAST SODIUM 10 MG PO TABS: 10.0000 mg | ORAL_TABLET | Freq: Every day | ORAL | Status: DC

## 2013-11-23 ENCOUNTER — Other Ambulatory Visit: Payer: Self-pay | Admitting: Family Medicine

## 2013-11-23 ENCOUNTER — Other Ambulatory Visit: Payer: Self-pay | Admitting: *Deleted

## 2013-11-23 MED ORDER — METOPROLOL SUCCINATE ER 50 MG PO TB24: ORAL_TABLET | ORAL | Status: DC

## 2013-11-23 MED ORDER — BECLOMETHASONE DIPROPIONATE 80 MCG/ACT IN AERS: INHALATION_SPRAY | RESPIRATORY_TRACT | Status: DC

## 2013-11-23 MED ORDER — MONTELUKAST SODIUM 10 MG PO TABS: 10.0000 mg | ORAL_TABLET | Freq: Every day | ORAL | Status: DC

## 2013-11-23 MED ORDER — LOSARTAN POTASSIUM 100 MG PO TABS: ORAL_TABLET | ORAL | Status: DC

## 2013-11-23 MED ORDER — FUROSEMIDE 20 MG PO TABS: ORAL_TABLET | ORAL | Status: DC

## 2013-11-23 MED ORDER — ESOMEPRAZOLE MAGNESIUM 40 MG PO CPDR: 40.0000 mg | DELAYED_RELEASE_CAPSULE | Freq: Two times a day (BID) | ORAL | Status: DC

## 2013-11-23 MED ORDER — ACYCLOVIR 200 MG PO CAPS: 400.0000 mg | ORAL_CAPSULE | Freq: Two times a day (BID) | ORAL | Status: DC

## 2013-11-23 MED ORDER — GABAPENTIN 300 MG PO CAPS: ORAL_CAPSULE | ORAL | Status: DC

## 2013-11-23 MED ORDER — SERTRALINE HCL 100 MG PO TABS: 150.0000 mg | ORAL_TABLET | Freq: Every day | ORAL | Status: DC

## 2013-11-23 MED ORDER — ATORVASTATIN CALCIUM 20 MG PO TABS: ORAL_TABLET | ORAL | Status: DC

## 2013-11-23 MED ORDER — HYDROCHLOROTHIAZIDE 12.5 MG PO CAPS: 12.5000 mg | ORAL_CAPSULE | Freq: Every day | ORAL | Status: DC

## 2013-11-23 MED ORDER — INSULIN GLARGINE 100 UNIT/ML SOLOSTAR PEN: PEN_INJECTOR | SUBCUTANEOUS | Status: DC

## 2013-11-23 MED ORDER — ALLOPURINOL 300 MG PO TABS: ORAL_TABLET | ORAL | Status: DC

## 2013-11-23 MED ORDER — ALBUTEROL SULFATE HFA 108 (90 BASE) MCG/ACT IN AERS: 2.0000 | INHALATION_SPRAY | RESPIRATORY_TRACT | Status: DC | PRN

## 2013-11-23 MED ORDER — AZELASTINE HCL 0.1 % NA SOLN: NASAL | Status: DC

## 2013-11-23 MED ORDER — METFORMIN HCL 1000 MG PO TABS: ORAL_TABLET | ORAL | Status: DC

## 2013-11-23 NOTE — Progress Notes (Signed)
Dear [name] Team Please call [name] [name] and make sure THESE (the ones I put  On your desk) are the ones she wants to go to [facility]. Then please fax them and tell her it is good for next year THANKS! [name] PS if there is one she does NOT want  To go to [facility], hold it back and let me know which one THANKS! [name]

## 2013-11-24 MED ORDER — METOPROLOL SUCCINATE ER 50 MG PO TB24: ORAL_TABLET | ORAL | Status: DC

## 2013-11-24 MED ORDER — LOSARTAN POTASSIUM 100 MG PO TABS: ORAL_TABLET | ORAL | Status: DC

## 2013-11-25 ENCOUNTER — Ambulatory Visit (INDEPENDENT_AMBULATORY_CARE_PROVIDER_SITE_OTHER): Payer: Medicare HMO

## 2013-11-25 ENCOUNTER — Telehealth: Payer: Self-pay | Admitting: Family Medicine

## 2013-11-25 DIAGNOSIS — J309 Allergic rhinitis, unspecified: Secondary | ICD-10-CM

## 2013-11-25 NOTE — Telephone Encounter (Signed)
[name] [name]: please call before 11:30

## 2013-11-25 NOTE — Telephone Encounter (Signed)
LVM for patient to call ma back

## 2013-11-26 NOTE — Telephone Encounter (Signed)
Spoke with patient and informed her that I am faxing out her rx's, she kept her other rx's in pharmacy just in case she dosesn't receive all of her meds.

## 2013-11-29 ENCOUNTER — Other Ambulatory Visit: Payer: Self-pay | Admitting: Family Medicine

## 2013-11-30 ENCOUNTER — Telehealth: Payer: Self-pay | Admitting: *Deleted

## 2013-11-30 NOTE — Telephone Encounter (Signed)
Prior Authorization received from [facility] for Nexium 40 mg capsule. Formulary and PA form placed in provider box for completion. There is a quanity limit on Nexium of 30 caps/ 30 day supply.  Derl Barrow, RN

## 2013-12-01 ENCOUNTER — Encounter: Payer: Self-pay | Admitting: Family Medicine

## 2013-12-01 ENCOUNTER — Ambulatory Visit (INDEPENDENT_AMBULATORY_CARE_PROVIDER_SITE_OTHER): Payer: Commercial Managed Care - HMO | Admitting: Family Medicine

## 2013-12-01 VITALS — BP 138/60 | HR 80 | Wt 180.2 lb

## 2013-12-01 DIAGNOSIS — F339 Major depressive disorder, recurrent, unspecified: Secondary | ICD-10-CM

## 2013-12-01 DIAGNOSIS — N644 Mastodynia: Secondary | ICD-10-CM

## 2013-12-01 MED ORDER — ESOMEPRAZOLE MAGNESIUM 40 MG PO CPDR: 40.0000 mg | DELAYED_RELEASE_CAPSULE | Freq: Every day | ORAL | Status: DC

## 2013-12-01 MED ORDER — NITROGLYCERIN 0.4 MG SL SUBL: 0.4000 mg | SUBLINGUAL_TABLET | SUBLINGUAL | Status: DC | PRN

## 2013-12-01 NOTE — Progress Notes (Signed)
   Subjective:    Patient ID: [name] [name], female    DOB: 10-05-1950, 63 y.o.   MRN: [id number]  HPI Left breast pain continues to be an issue. It is sore to touch in that area and she also has some occasional sharp shooting pains that radiate laterally and back up under the breast. Does not seem to be associated with food or activity. She has no other symptoms associated with it such as shortness of breath, diaphoresis, heartburn. #2. 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 #3. Otherwise she's doing quite well, needs a few refills is having no problems with her regular medications.   Review of Systems Denies fever, sweats, chills, unusual weight change.    Objective:   Physical Exam  Vital signs are reviewed GENERAL: Well-developed female no acute distress. She's walking quite well today using her walker. BREASTS: Bilaterally symmetrical. She is tender in the midportion of the left breast I find no mass ear, no asymmetry, no skin change. The axilla is benign without any lymphadenopathy. CV: Regular rate and rhythm without murmur LUNGS: Clear to auscultation bilaterally      Assessment & Plan:  #1 left breast pain. Her last mammogram was normal in September. I think we'll start by getting an ultrasound of the left breast and proceed from there. I'll see her back after her ultrasound. #2Venida Jarvis out the forms for her camp attendance.

## 2013-12-01 NOTE — Telephone Encounter (Signed)
Left pt a voice message regarding Nexium.  Pt to start taking Nexium once a day and will be reevaluated at next visit with Dr. Nori Riis.  New Rx went to [facility] for Nexium once a day per Dr. Nori Riis.  Derl Barrow, RN

## 2013-12-01 NOTE — Assessment & Plan Note (Signed)
This seems very good today. I think she is enjoying spring.

## 2013-12-01 NOTE — Telephone Encounter (Signed)
[name] [name] I have reviewed her chart and there is nothing that will qualify her for the twice a day dosing so I am going to change it to once a day--I am sending in t that Rx to [facility]--I don't think she will need teh BID dosing long term anyway---we can check at her next visit. Just let her know to start taking it ONCE A DAY THANKS! [name]

## 2013-12-02 ENCOUNTER — Ambulatory Visit (INDEPENDENT_AMBULATORY_CARE_PROVIDER_SITE_OTHER): Payer: Commercial Managed Care - HMO

## 2013-12-02 DIAGNOSIS — J309 Allergic rhinitis, unspecified: Secondary | ICD-10-CM

## 2013-12-07 ENCOUNTER — Ambulatory Visit (INDEPENDENT_AMBULATORY_CARE_PROVIDER_SITE_OTHER): Payer: Commercial Managed Care - HMO

## 2013-12-07 DIAGNOSIS — J309 Allergic rhinitis, unspecified: Secondary | ICD-10-CM

## 2013-12-08 ENCOUNTER — Ambulatory Visit
Admission: RE | Admit: 2013-12-08 | Discharge: 2013-12-08 | Disposition: A | Discharge status: Home / Self Care | Payer: Commercial Managed Care - HMO | Source: Ambulatory Visit | Attending: Family Medicine | Admitting: Family Medicine

## 2013-12-08 ENCOUNTER — Ambulatory Visit
Admission: RE | Admit: 2013-12-08 | Discharge: 2013-12-08 | Disposition: A | Discharge status: Home / Self Care | Payer: Self-pay | Source: Ambulatory Visit | Attending: Family Medicine | Admitting: Family Medicine

## 2013-12-08 DIAGNOSIS — N644 Mastodynia: Secondary | ICD-10-CM

## 2013-12-09 ENCOUNTER — Ambulatory Visit (INDEPENDENT_AMBULATORY_CARE_PROVIDER_SITE_OTHER): Payer: Commercial Managed Care - HMO

## 2013-12-09 DIAGNOSIS — J309 Allergic rhinitis, unspecified: Secondary | ICD-10-CM

## 2013-12-16 ENCOUNTER — Telehealth: Payer: Self-pay | Admitting: *Deleted

## 2013-12-16 ENCOUNTER — Ambulatory Visit: Payer: Commercial Managed Care - HMO

## 2013-12-16 NOTE — Telephone Encounter (Signed)
Pt request referral to Dr.Marshall Freeman for her headaches. She has been seen by him before, but needs referral from PCP to go back. I told the pt, that Dr.Neal is not in office for a few days. Pt said, it's okay to wait. She just needs the referral and she can make her own appt. Fwd to Dr.Neal. Thanks. .[name]  

## 2013-12-16 NOTE — Telephone Encounter (Signed)
Pt request referral to Dr.Marshall Domingo Cocking for her headaches. She has been seen by him before, but needs referral from PCP to go back. I told the pt, that Dr.Neal is not in office for a few days. Pt said, it's okay to wait. She just needs the referral and she can make her own appt. Fwd to Dr.Neal. Thanks. Mauricia Area

## 2013-12-17 ENCOUNTER — Ambulatory Visit (INDEPENDENT_AMBULATORY_CARE_PROVIDER_SITE_OTHER): Payer: Commercial Managed Care - HMO

## 2013-12-17 DIAGNOSIS — J309 Allergic rhinitis, unspecified: Secondary | ICD-10-CM

## 2013-12-21 ENCOUNTER — Telehealth: Payer: Self-pay | Admitting: Family Medicine

## 2013-12-21 DIAGNOSIS — H543 Unqualified visual loss, both eyes: Secondary | ICD-10-CM

## 2013-12-21 DIAGNOSIS — J449 Chronic obstructive pulmonary disease, unspecified: Secondary | ICD-10-CM

## 2013-12-21 NOTE — Telephone Encounter (Signed)
Patient calls, needing [facility] referral to to [facility] and Dr. Kirstie Mirza office .

## 2013-12-22 ENCOUNTER — Telehealth: Payer: Self-pay | Admitting: Family Medicine

## 2013-12-22 ENCOUNTER — Telehealth: Payer: Self-pay | Admitting: *Deleted

## 2013-12-22 DIAGNOSIS — H409 Unspecified glaucoma: Secondary | ICD-10-CM

## 2013-12-22 NOTE — Telephone Encounter (Signed)
Received refill request for Vitamin D2 1.25 mg (50,000 unit) from [facility]. Medication is not listed on current medication list. Derl Barrow, RN

## 2013-12-22 NOTE — Telephone Encounter (Signed)
Pt called and is looking for the referral to opthalmology and also would like Clarise Cruz to call her. jw

## 2013-12-23 ENCOUNTER — Ambulatory Visit (INDEPENDENT_AMBULATORY_CARE_PROVIDER_SITE_OTHER): Payer: Commercial Managed Care - HMO

## 2013-12-23 DIAGNOSIS — J309 Allergic rhinitis, unspecified: Secondary | ICD-10-CM

## 2013-12-23 NOTE — Telephone Encounter (Signed)
Referral put in.

## 2013-12-27 ENCOUNTER — Other Ambulatory Visit: Payer: Self-pay | Admitting: Family Medicine

## 2013-12-27 DIAGNOSIS — G8929 Other chronic pain: Secondary | ICD-10-CM

## 2013-12-27 DIAGNOSIS — R51 Headache: Principal | ICD-10-CM

## 2013-12-27 DIAGNOSIS — R519 Headache, unspecified: Secondary | ICD-10-CM

## 2013-12-27 MED ORDER — ERGOCALCIFEROL 1.25 MG (50000 UT) PO CAPS: 50000.0000 [IU] | ORAL_CAPSULE | ORAL | Status: DC

## 2013-12-27 NOTE — Telephone Encounter (Signed)
Please update referrals to [facility] and to Dr Domingo Cocking (optho) needs [facility] referral [facility]! [name] [name]

## 2013-12-30 ENCOUNTER — Ambulatory Visit (INDEPENDENT_AMBULATORY_CARE_PROVIDER_SITE_OTHER): Payer: Commercial Managed Care - HMO

## 2013-12-30 DIAGNOSIS — J309 Allergic rhinitis, unspecified: Secondary | ICD-10-CM

## 2014-01-03 ENCOUNTER — Ambulatory Visit (INDEPENDENT_AMBULATORY_CARE_PROVIDER_SITE_OTHER): Payer: Commercial Managed Care - HMO | Admitting: Internal Medicine

## 2014-01-03 ENCOUNTER — Encounter: Payer: Self-pay | Admitting: Internal Medicine

## 2014-01-03 ENCOUNTER — Ambulatory Visit (INDEPENDENT_AMBULATORY_CARE_PROVIDER_SITE_OTHER): Payer: Commercial Managed Care - HMO

## 2014-01-03 VITALS — BP 122/84 | HR 71 | Ht 61.0 in | Wt 188.8 lb

## 2014-01-03 DIAGNOSIS — G4733 Obstructive sleep apnea (adult) (pediatric): Secondary | ICD-10-CM

## 2014-01-03 DIAGNOSIS — R079 Chest pain, unspecified: Secondary | ICD-10-CM

## 2014-01-03 DIAGNOSIS — J301 Allergic rhinitis due to pollen: Secondary | ICD-10-CM

## 2014-01-03 DIAGNOSIS — J309 Allergic rhinitis, unspecified: Secondary | ICD-10-CM

## 2014-01-03 NOTE — Progress Notes (Addendum)
Patient ID: [name] [name], female    DOB: August 01, 1951, 63 y.o.   MRN: [id number]  HPI 01/24/11 61 yoF former smoker followed for asthma, sleep apnea, complicated by hx depression, vocal cord paralysis, GERD. Last here August 17, 2010- note reviewed  Doesn't wear CPAP- can't tolerate it on her face. Didn't like nasal pillows or her current nasal mask. Has own teeth. Aware she tosses and turns at night. CPAP 12 Advanced.  Denies recent asthma attacks. Uses rescue inhaler 1-2x/ month. Continues allergy shots and says she breathes better and coughs less since she has been on them.  No longer chokes or coughs much with eating as she used to. No recent colds.   07/31/11- 38 yoF former smoker followed for asthma, sleep apnea, complicated by hx depression, vocal cord paralysis, GERD. Has had flu vaccine. Continues CPAP at 12 CWP,  used all night every night. She had moved, and can't find her nebulizer machine. We discussed whether she would use it this winter. She continues allergy vaccine and believes that has helped.  03/01/13- 73 yoF former smoker followed for asthma/ COPD, sleep apnea, complicated by hx depression, vocal cord paralysis, GERD. FOLLOWS FOR: has good and bad days since no longer on vaccine; Wears CPAP 12/ Advanced every night for about 8-9 hours; pressure working well for patient. Neck pain s/p c-spine fusion, ,imits comfortable sleep position with CPAP. Occ wheeze and persistent nose and chest congestion despite meds. Would like to restart allergy vaccine- discussed. Thinks she is mobile enough with [facility] transport to get here.  PFT 2011 documented moderate obstruction w/ little response to bronchodilator.   07/01/13- 3 yoF former smoker followed for asthma/ COPD, sleep apnea, complicated by hx depression, vocal cord paralysis, GERD. FOLLOWS FOR: wears CPAP 12/ Advanced every night for abour 8 or more hours; pressure doing well for patient. Still on allergy vaccine; noticed she had  some bumps on arms after her injections(varies). Has noticed that about 5-10 minutes after Brovana treatment she has jitters and nervous feelings. Holding vaccine dose at 1:5000 [place]. She notices minor bumps but may be unrelated.  01/03/14- 39 yoF former smoker followed for asthma/ COPD, sleep apnea, complicated by hx depression, vocal cord paralysis, GERD. FOLLOWS FOR: continues to wear CPAP12/[facility] every night for about 5-9 hours; pressure is doing well; DME is [facility]. Pt is still on vaccine and doing well. CPAP doing well.  Allergy vaccine 1:50 GH, holding. Jaw pain w/ sustained walking in January. Sees Dr P.Ross/ Cards. Cath neg.  Review of Systems-see HPI Constitutional:   No-   weight loss, night sweats, fevers, chills, fatigue, lassitude. HEENT:   No-  headaches, difficulty swallowing, tooth/dental problems, sore throat,+ impaired vision      No-  sneezing, itching, ear ache, +nasal congestion, post nasal drip,  CV:  No-   chest pain, orthopnea, PND, swelling in lower extremities, anasarca, dizziness, palpitations Resp: No-   shortness of breath with exertion or at rest.              No-   productive cough,  + non-productive cough,  No- coughing up of blood.              No-   change in color of mucus.  +Occasional- wheezing.   Skin: -+ per HPI GI:  No-   heartburn, indigestion, abdominal pain, nausea, vomiting,  GU:. MS:  Carpal tunnel pains.  . Neuro-     nothing unusual Psych:  No- change in  mood or affect. No depression or anxiety.  No memory loss.     Objective:   Physical Exam General- Alert, Oriented, Affect-appropriate, Distress- none acute. Overweight  Skin- + diffuse tiny subcutaneous nodules on the arms, nontender, mobile. I think these are in                    the subcutaneous fat Lymphadenopathy- none Head- atraumatic            Eyes- +Very thick corrective lenses, PERRLA, conjunctivae clear secretions, +strabismus            Ears- Hearing, canals-normal             Nose- Clear, no-Septal dev, mucus, polyps, erosion, perforation             Throat- Mallampati II , mucosa clear , drainage- none, tonsils- atrophic.  Neck- flexible , trachea midline, no stridor , thyroid nl, carotid no bruit Chest - symmetrical excursion , unlabored           Heart/CV- RRR , no murmur , no gallop  , no rub, nl s1 s2                           - JVD- none , edema- none, stasis changes- none, varices- none           Lung- clear to P&A, wheeze- none, cough- none , dullness-none, rub- none           Chest wall-  Abd-  Br/ Gen/ Rectal- Not done, not indicated Extrem- cyanosis- none, clubbing, none, atrophy- none, +rolling walker Neuro- grossly intact to observation

## 2014-01-03 NOTE — Patient Instructions (Signed)
We can continue CPAP 12/ [place] to continue allergy shots. I will talk with the allergy lab about increasing the strength of your shots as tolerated.  Please let Dr Harrington Challenger know about the jaw pain and pressure feeling you get with walking

## 2014-01-06 ENCOUNTER — Ambulatory Visit: Payer: Commercial Managed Care - HMO

## 2014-01-11 NOTE — Telephone Encounter (Signed)
[facility] faxed the referral and they don't authorized or approved the referral for Ophthalmology because pt saw a optometric no a ophthalmology.  [facility] denied paper faxed to [facility] [phone]  Referral for Dr. Domingo Cocking was authorized by [facility] # [id number] I faxed to Dr. Kirstie Mirza Office on 12/27/2013 They approved 4 visits.    [name] [name]

## 2014-01-13 ENCOUNTER — Ambulatory Visit (INDEPENDENT_AMBULATORY_CARE_PROVIDER_SITE_OTHER): Payer: Commercial Managed Care - HMO

## 2014-01-13 DIAGNOSIS — J309 Allergic rhinitis, unspecified: Secondary | ICD-10-CM

## 2014-01-17 ENCOUNTER — Encounter (HOSPITAL_COMMUNITY): Payer: Self-pay | Admitting: Pharmacy Technician

## 2014-01-17 ENCOUNTER — Ambulatory Visit (INDEPENDENT_AMBULATORY_CARE_PROVIDER_SITE_OTHER): Payer: Commercial Managed Care - HMO | Admitting: Internal Medicine

## 2014-01-17 ENCOUNTER — Encounter: Payer: Self-pay | Admitting: Internal Medicine

## 2014-01-17 ENCOUNTER — Encounter: Payer: Self-pay | Admitting: Cardiology

## 2014-01-17 VITALS — BP 120/62 | HR 64 | Ht 61.0 in | Wt 180.0 lb

## 2014-01-17 DIAGNOSIS — R0789 Other chest pain: Secondary | ICD-10-CM

## 2014-01-17 DIAGNOSIS — R6884 Jaw pain: Secondary | ICD-10-CM

## 2014-01-17 LAB — CBC WITH DIFFERENTIAL/PLATELET
Basophils Absolute: 0 10*3/uL (ref 0.0–0.1)
Basophils Relative: 0.4 % (ref 0.0–3.0)
Eosinophils Absolute: 0.1 10*3/uL (ref 0.0–0.7)
Eosinophils Relative: 1.3 % (ref 0.0–5.0)
HCT: 35 % — ABNORMAL LOW (ref 36.0–46.0)
Hemoglobin: 11.1 g/dL — ABNORMAL LOW (ref 12.0–15.0)
Lymphocytes Relative: 31.9 % (ref 12.0–46.0)
Lymphs Abs: 3.2 10*3/uL (ref 0.7–4.0)
MCHC: 31.6 g/dL (ref 30.0–36.0)
MCV: 86.8 fl (ref 78.0–100.0)
Monocytes Absolute: 0.6 10*3/uL (ref 0.1–1.0)
Monocytes Relative: 6 % (ref 3.0–12.0)
Neutro Abs: 6 10*3/uL (ref 1.4–7.7)
Neutrophils Relative %: 60.4 % (ref 43.0–77.0)
Platelets: 198 10*3/uL (ref 150.0–400.0)
RBC: 4.04 Mil/uL (ref 3.87–5.11)
RDW: 16.3 % — ABNORMAL HIGH (ref 11.5–15.5)
WBC: 10 10*3/uL (ref 4.0–10.5)

## 2014-01-17 LAB — BASIC METABOLIC PANEL
BUN: 11 mg/dL (ref 6–23)
CO2: 31 mEq/L (ref 19–32)
Calcium: 9.6 mg/dL (ref 8.4–10.5)
Chloride: 98 mEq/L (ref 96–112)
Creatinine, Ser: 0.7 mg/dL (ref 0.4–1.2)
GFR: 105.66 mL/min (ref >60.00)
Glucose, Bld: 155 mg/dL — ABNORMAL HIGH (ref 70–99)
Potassium: 3.4 mEq/L — ABNORMAL LOW (ref 3.5–5.1)
Sodium: 139 mEq/L (ref 135–145)

## 2014-01-17 NOTE — Progress Notes (Signed)
HPI Patient is a 63 year old with a history of HTN and HL and sleep apnea. Cath in 2005 with normal coronary arteries. I saw her in November 2014 She was seen by Glynn Octave recently  Complained of jaw pain and chest tightness Occur only  With exertion.  None at rest  Began a couple months ago. Seems to be progressive.   No episodes at rest or at night.   Uses CPAP at night   Allergies  Allergen Reactions  . Amlodipine Besylate Swelling  . Lisinopril-Hydrochlorothiazide Cough    REACTION: cough-- changed to arb  . Propoxyphene Hcl Itching  . Valsartan Other (See Comments)    REACTION:  "sleep more the next day after I took it; it made me real tired"  . Metronidazole     REACTION: "just didn't work; my body never did heal from it"    Current Outpatient Prescriptions  Medication Sig Dispense Refill  . acyclovir (ZOVIRAX) 200 MG capsule Take 2 capsules (400 mg total) by mouth 2 (two) times daily.  360 capsule  3  . albuterol (PROVENTIL HFA;VENTOLIN HFA) 108 (90 BASE) MCG/ACT inhaler Inhale 2 puffs into the lungs every 4 (four) hours as needed. For shortness of breath  3 Inhaler  3  . albuterol (PROVENTIL) (2.5 MG/3ML) 0.083% nebulizer solution Take 3 mLs (2.5 mg total) by nebulization every 6 (six) hours as needed for wheezing or shortness of breath.  120 mL  12  . allopurinol (ZYLOPRIM) 300 MG tablet TAKE ONE (1) TABLET EACH DAY  90 tablet  3  . ammonium lactate (LAC-HYDRIN) 12 % lotion APPLY TO BOTTOM OF LEFT FOOT 3 TO 4 TIMES DAILY AS NEEDED FOR DRY SKIN  400 g  5  . aspirin EC 81 MG tablet Take 81 mg by mouth daily.       [name] atorvastatin (LIPITOR) 20 MG tablet TAKE ONE (1) TABLET EACH DAY  90 tablet  3  . azelastine (ASTELIN) 137 MCG/SPRAY nasal spray PLACE 2 SPRAYS INTO THE NOSE DAILY AS   NEEDED USE IN EACH NOSTRIL ASDIRECTED  90 mL  3  . baclofen (LIORESAL) 10 MG tablet TAKE 1/2 TO 1 TABLET BY MOUTH AT NIGHT AS NEEDED FOR NECK PAIN  30 tablet  12  . beclomethasone (QVAR) 80 MCG/ACT  inhaler INHALE 1 PUFF INTO THE LUNGS AS NEEDED FOR WHEEZING.  26.1 g  3  . brimonidine (ALPHAGAN P) 0.1 % SOLN Place 1 drop into the left eye 2 (two) times daily.        [name] EPINEPHrine (EPI-PEN) 0.3 mg/0.3 mL SOAJ Inject 0.3 mLs (0.3 mg total) into the muscle once.  1 Device  11  . ergocalciferol (VITAMIN D2) 50000 UNITS capsule Take 1 capsule (50,000 Units total) by mouth once a week.  4 capsule  12  . esomeprazole (NEXIUM) 40 MG capsule Take 1 capsule (40 mg total) by mouth daily.  90 capsule  3  . furosemide (LASIX) 20 MG tablet TAKE ONE (1) TABLET EACH DAY  90 tablet  3  . gabapentin (NEURONTIN) 300 MG capsule Take 2 tabs by mouth at night  180 capsule  3  . hydrochlorothiazide (MICROZIDE) 12.5 MG capsule Take 1 capsule (12.5 mg total) by mouth daily.  90 capsule  3  . HYDROcodone-acetaminophen (NORCO/VICODIN) 5-325 MG per tablet Take one half or one tablet every 8 hours as needed for neck pain  60 tablet  0  . Insulin Glargine (LANTUS) 100 UNIT/ML Solostar Pen INJECT  [place]      . Insulin Pen Needle 30G X 5 MM MISC by Does not apply route.      . latanoprost (XALATAN) 0.005 % ophthalmic solution PLACE 1 DROP INTO BOTH EYES AT BEDTIME  7.5 mL  3  . losartan (COZAAR) 100 MG tablet TAKE 1 TABLET BY MOUTH EVERY DAY  90 tablet  3  . metFORMIN (GLUCOPHAGE) 1000 MG tablet Take 1.5 tablet in the morning and 1 tablet in the evening  215 tablet  3  . metoprolol succinate (TOPROL-XL) 50 MG 24 hr tablet TAKE TWO TABLETS EVERY DAY  160 tablet  3  . montelukast (SINGULAIR) 10 MG tablet Take 1 tablet (10 mg total) by mouth daily.  90 tablet  3  . nitroGLYCERIN (NITROSTAT) 0.4 MG SL tablet Place 1 tablet (0.4 mg total) under the tongue every 5 (five) minutes as needed. For chest pain  30 tablet  2  . polyethylene glycol (MIRALAX / GLYCOLAX) packet Take 17 g by mouth daily as needed. For constipation      . promethazine-codeine (PHENERGAN WITH CODEINE) 6.25-10 MG/5ML syrup Take 5 mLs by  mouth every 6 (six) hours as needed for cough.  200 mL  0  . sertraline (ZOLOFT) 100 MG tablet Take 1.5 tablets (150 mg total) by mouth at bedtime.  135 tablet  3   No current facility-administered medications for this visit.    Past Medical History  Diagnosis Date  . Chronic cough     Now followed by pulmonary  . Sleep apnea     on CPAP 12  . HTN (hypertension)   . Dyslipidemia   . Gout   . History of colonoscopy   . Concussion 11/18/11    "fell at dr's office; hit head"  . Complication of anesthesia     "just wake up coughing; that's all"  . Heart murmur   . Asthma   . COPD (chronic obstructive pulmonary disease)   . Diabetes mellitus type II     "take insulin & pills"  . Blood transfusion   . GERD (gastroesophageal reflux disease)   . Arthritis   . Chronic lower back pain   . Vocal cord paralysis, unilateral partial     "right"  . Depression   . Peripheral neuropathy   . Angina   . Pneumonia     "i've had it once" (11/18/11)  . Exertional dyspnea   . Headache(784.0) 11/18/11    "I've had mild headaches the last couple days"  . Headache(784.0) 01/08/12    "pretty constant since 11/18/11's concussion"  . Syncope 06/16/2012    Past Surgical History  Procedure Laterality Date  . Colonoscopy w/ biopsies  11/21/2010    adenoma polyp--no hi grade dysplasia Dr Collene Mares  . Treatment fistula anal    . Breast reduction surgery  04-21-2003  . Tubal ligation  1987  . Cardiac catheterization  03-02-2004  . Bronchoscopy  07-2007  . Knee arthroscopy  08/2000    right  . Shoulder arthroscopy  08/2000    right  . T-score  09/02/04    -0.54 (low risk currently)  . Total abdominal hysterectomy  10-07-2000  . Cesarean section  1987  . Shoulder arthroscopy w/ rotator cuff repair  10/18/2003    left  . Cataract extraction  1955; 1979    bilateral; right eye  . Eye surgery    . Breast biopsy      bilaterally  . Breast cyst  excision      right twice; left once  . Carpal tunnel release       left  . Anterior cervical decomp/discectomy fusion  01/15/2012    Procedure: ANTERIOR CERVICAL DECOMPRESSION/DISCECTOMY FUSION 3 LEVELS;  Surgeon: Eustace Moore, MD;  Location: [facility];  Service: Neurosurgery;  Laterality: N/A;  Anterior Cervical Decompression Discectomy Fusion Cerivcal three four, cervical four five, cervical five six, cervical six seven    Family History  Problem Relation Age of Onset  . Diabetes Mother   . Colon cancer Neg Hx     History   Social History  . Marital Status: Single    Spouse Name: N/A    Number of Children: 1  . Years of Education: 12   Occupational History  . unemployed    Social History Main Topics  . Smoking status: Former Smoker -- 0.50 packs/day for .5 years    Types: Cigarettes    Quit date: 09/03/1975  . Smokeless tobacco: Never Used  . Alcohol Use: No  . Drug Use: No  . Sexual Activity: Not Currently   Other Topics Concern  . Not on file   Social History Narrative   Approved for section 8 housing (12/03).         Health Care POA:    Emergency Contact: sister, catherine Artist [phone]   End of Life Plan:    Who lives with you: Has a son who may be autistic and stays with her at times.   Any pets: none   Diet: Pt has a varied diet of protein, starch, and vegetables.   Exercise: Pt walks 1x a week with [place] parks/recs group for visually impaired.   Seatbelts: Pt reports wearing seatbelt when in vehicles.    Hobbies: walking, tv, church, eating          Review of Systems:  All systems reviewed.  They are negative to the above problem except as previously stated.  Vital Signs: BP 120/62  Pulse 64  Ht 5\' 1"  (1.549 m)  Wt 180 lb (81.647 kg)  BMI 34.03 kg/m2  Physical Exam Patient is in NAD HEENT:  Normocephalic, atraumatic. EOMI, PERRLA.  Neck: JVP is normal.  No bruits.  Lungs: Decreased airflow   No rales no wheezes.  Heart: Regular rate and rhythm. Normal S1, S2. No S3.   No significant murmurs. PMI  not displaced.  Abdomen:  Supple, nontender. Normal bowel sounds. No masses. No hepatomegaly.  Extremities:   Good distal pulses throughout. Tr lower extremity edema.  Musculoskeletal :moving all extremities.  Neuro:   alert and oriented x3.  CN II-XII grossly intact.  EKG;  SR  LAD  LAA  74 bpm Assessment and Plan:  Patient is a 63 yo with no known CAD  Recently in pulmonary clinic  Referred for symptoms that are very suspicious for exertional angina.  I discussed with patinet   I Am concerned about false negative with myoview  I would recommm R and L heart cath to define anatomy and pressures (given lung dz) Patinet understands risks, benefits  Agrees to proceed  Wants to find out what is going on.    2.  HTN  Continue current meds  3.  HL  Keep on statin  4.  COPD  Followed in pulmonary  COntinue inhalers.

## 2014-01-17 NOTE — Patient Instructions (Addendum)

## 2014-01-18 ENCOUNTER — Telehealth: Payer: Self-pay | Admitting: Internal Medicine

## 2014-01-18 DIAGNOSIS — I10 Essential (primary) hypertension: Secondary | ICD-10-CM

## 2014-01-18 LAB — PROTIME-INR
INR: 1.1 ratio — ABNORMAL HIGH (ref 0.8–1.0)
Prothrombin Time: 12 s (ref 9.6–13.1)

## 2014-01-18 NOTE — Telephone Encounter (Signed)
Spoke to patient regarding Dr. Harrington Challenger' advisement that her K+ was low and she needs to drink OJ and/or tomato juice tonight, as she is having a cath procedure tomorrow. Patient stated she has no fresh fruits or vegetables at home and she does not have the money to get any tonight. Discussed with patient the different types of food she can eat to supplement her potassium levels. Patient stated she doesn't usually have food like that (fresh fruits/juices/vegetables). Discussed hypokalemia s/sx. Routed to Dr. Harrington Challenger.

## 2014-01-18 NOTE — Telephone Encounter (Signed)
New problem   Pt stated someone call her.

## 2014-01-19 ENCOUNTER — Encounter (HOSPITAL_COMMUNITY)
Admission: RE | Disposition: A | Discharge status: Home / Self Care | Payer: Self-pay | Source: Ambulatory Visit | Attending: Interventional Cardiology

## 2014-01-19 ENCOUNTER — Other Ambulatory Visit: Payer: Self-pay | Admitting: Internal Medicine

## 2014-01-19 ENCOUNTER — Ambulatory Visit (HOSPITAL_COMMUNITY)
Admission: RE | Admit: 2014-01-19 | Discharge: 2014-01-19 | Disposition: A | Discharge status: Home / Self Care | Payer: Medicare HMO | Source: Ambulatory Visit | Attending: Interventional Cardiology | Admitting: Interventional Cardiology

## 2014-01-19 DIAGNOSIS — Z981 Arthrodesis status: Secondary | ICD-10-CM | POA: Insufficient documentation

## 2014-01-19 DIAGNOSIS — Z87891 Personal history of nicotine dependence: Secondary | ICD-10-CM | POA: Insufficient documentation

## 2014-01-19 DIAGNOSIS — I2510 Atherosclerotic heart disease of native coronary artery without angina pectoris: Secondary | ICD-10-CM

## 2014-01-19 DIAGNOSIS — Z7982 Long term (current) use of aspirin: Secondary | ICD-10-CM | POA: Insufficient documentation

## 2014-01-19 DIAGNOSIS — F329 Major depressive disorder, single episode, unspecified: Secondary | ICD-10-CM | POA: Insufficient documentation

## 2014-01-19 DIAGNOSIS — R0602 Shortness of breath: Secondary | ICD-10-CM

## 2014-01-19 DIAGNOSIS — I2789 Other specified pulmonary heart diseases: Secondary | ICD-10-CM | POA: Insufficient documentation

## 2014-01-19 DIAGNOSIS — G4730 Sleep apnea, unspecified: Secondary | ICD-10-CM | POA: Insufficient documentation

## 2014-01-19 DIAGNOSIS — F3289 Other specified depressive episodes: Secondary | ICD-10-CM | POA: Insufficient documentation

## 2014-01-19 DIAGNOSIS — J4489 Other specified chronic obstructive pulmonary disease: Secondary | ICD-10-CM | POA: Insufficient documentation

## 2014-01-19 DIAGNOSIS — J449 Chronic obstructive pulmonary disease, unspecified: Secondary | ICD-10-CM | POA: Insufficient documentation

## 2014-01-19 DIAGNOSIS — R079 Chest pain, unspecified: Secondary | ICD-10-CM

## 2014-01-19 DIAGNOSIS — M109 Gout, unspecified: Secondary | ICD-10-CM | POA: Insufficient documentation

## 2014-01-19 DIAGNOSIS — I10 Essential (primary) hypertension: Secondary | ICD-10-CM | POA: Insufficient documentation

## 2014-01-19 DIAGNOSIS — G609 Hereditary and idiopathic neuropathy, unspecified: Secondary | ICD-10-CM | POA: Insufficient documentation

## 2014-01-19 DIAGNOSIS — E119 Type 2 diabetes mellitus without complications: Secondary | ICD-10-CM | POA: Insufficient documentation

## 2014-01-19 DIAGNOSIS — K219 Gastro-esophageal reflux disease without esophagitis: Secondary | ICD-10-CM | POA: Insufficient documentation

## 2014-01-19 DIAGNOSIS — E785 Hyperlipidemia, unspecified: Secondary | ICD-10-CM | POA: Insufficient documentation

## 2014-01-19 DIAGNOSIS — Z794 Long term (current) use of insulin: Secondary | ICD-10-CM | POA: Insufficient documentation

## 2014-01-19 HISTORY — PX: LEFT AND RIGHT HEART CATHETERIZATION WITH CORONARY ANGIOGRAM: SHX5449

## 2014-01-19 LAB — POCT I-STAT 3, ART BLOOD GAS (G3+)
Acid-Base Excess: 3 mmol/L — ABNORMAL HIGH (ref 0.0–2.0)
Bicarbonate: 30 meq/L — ABNORMAL HIGH (ref 20.0–24.0)
O2 Saturation: 99 %
TCO2: 32 mmol/L (ref 0–100)
pCO2 arterial: 55.6 mmHg — ABNORMAL HIGH (ref 35.0–45.0)
pH, Arterial: 7.339 — ABNORMAL LOW (ref 7.350–7.450)
pO2, Arterial: 157 mmHg — ABNORMAL HIGH (ref 80.0–100.0)

## 2014-01-19 LAB — POCT ACTIVATED CLOTTING TIME: Activated Clotting Time: 188 s

## 2014-01-19 LAB — POCT I-STAT 3, VENOUS BLOOD GAS (G3P V)
Acid-Base Excess: 3 mmol/L — ABNORMAL HIGH (ref 0.0–2.0)
Bicarbonate: 29.4 meq/L — ABNORMAL HIGH (ref 20.0–24.0)
O2 Saturation: 72 %
TCO2: 31 mmol/L (ref 0–100)
pCO2, Ven: 53.3 mmHg — ABNORMAL HIGH (ref 45.0–50.0)
pH, Ven: 7.349 — ABNORMAL HIGH (ref 7.250–7.300)
pO2, Ven: 41 mmHg (ref 30.0–45.0)

## 2014-01-19 LAB — POTASSIUM: Potassium: 3.8 mEq/L (ref 3.7–5.3)

## 2014-01-19 LAB — GLUCOSE, CAPILLARY: Glucose-Capillary: 154 mg/dL — ABNORMAL HIGH (ref 70–99)

## 2014-01-19 SURGERY — LEFT AND RIGHT HEART CATHETERIZATION WITH CORONARY ANGIOGRAM
Anesthesia: LOCAL

## 2014-01-19 MED ORDER — NITROGLYCERIN 0.2 MG/ML ON CALL CATH LAB
INTRAVENOUS | Status: AC
  Filled 2014-01-19: qty 1

## 2014-01-19 MED ORDER — HEPARIN (PORCINE) IN NACL 2-0.9 UNIT/ML-% IJ SOLN
INTRAMUSCULAR | Status: AC
  Filled 2014-01-19: qty 1000

## 2014-01-19 MED ORDER — SODIUM CHLORIDE 0.9 % IV SOLN: 250.0000 mL | INTRAVENOUS | Status: DC | PRN

## 2014-01-19 MED ORDER — ASPIRIN 81 MG PO CHEW
81.0000 mg | CHEWABLE_TABLET | ORAL | Status: AC
  Administered 2014-01-19: 81 mg via ORAL
  Filled 2014-01-19: qty 1

## 2014-01-19 MED ORDER — VERAPAMIL HCL 2.5 MG/ML IV SOLN
INTRAVENOUS | Status: AC
  Filled 2014-01-19: qty 2

## 2014-01-19 MED ORDER — SODIUM CHLORIDE 0.9 % IJ SOLN: 3.0000 mL | INTRAMUSCULAR | Status: DC | PRN

## 2014-01-19 MED ORDER — SODIUM CHLORIDE 0.9 % IV SOLN: INTRAVENOUS | Status: DC

## 2014-01-19 MED ORDER — OXYCODONE-ACETAMINOPHEN 5-325 MG PO TABS: 1.0000 | ORAL_TABLET | ORAL | Status: DC | PRN

## 2014-01-19 MED ORDER — SODIUM CHLORIDE 0.9 % IJ SOLN: 3.0000 mL | Freq: Two times a day (BID) | INTRAMUSCULAR | Status: DC

## 2014-01-19 MED ORDER — LIDOCAINE HCL (PF) 1 % IJ SOLN
INTRAMUSCULAR | Status: AC
  Filled 2014-01-19: qty 30

## 2014-01-19 MED ORDER — MIDAZOLAM HCL 2 MG/2ML IJ SOLN
INTRAMUSCULAR | Status: AC
  Filled 2014-01-19: qty 2

## 2014-01-19 MED ORDER — FENTANYL CITRATE 0.05 MG/ML IJ SOLN
INTRAMUSCULAR | Status: AC
  Filled 2014-01-19: qty 2

## 2014-01-19 MED ORDER — SODIUM CHLORIDE 0.9 % IV SOLN
INTRAVENOUS | Status: DC
  Administered 2014-01-19: 11:00:00 via INTRAVENOUS

## 2014-01-19 MED ORDER — HEPARIN SODIUM (PORCINE) 1000 UNIT/ML IJ SOLN
INTRAMUSCULAR | Status: AC
  Filled 2014-01-19: qty 1

## 2014-01-19 NOTE — Interval H&P Note (Signed)
Cath Lab Visit (complete for each Cath Lab visit)  Clinical Evaluation Leading to the Procedure:   ACS: no  Non-ACS:    Anginal Classification: CCS II  Anti-ischemic medical therapy: Maximal Therapy (2 or more classes of medications)  Non-Invasive Test Results: Low-risk stress test findings: cardiac mortality <1%/year  Prior CABG: No previous CABG      History and Physical Interval Note:  01/19/2014 11:34 AM  [name] [name]  has presented today for surgery, with the diagnosis of CP  The various methods of treatment have been discussed with the patient and family. After consideration of risks, benefits and other options for treatment, the patient has consented to  Procedure(s): LEFT AND RIGHT HEART CATHETERIZATION WITH CORONARY ANGIOGRAM (N/A) as a surgical intervention .  The patient's history has been reviewed, patient examined, no change in status, stable for surgery.  I have reviewed the patient's chart and labs.  Questions were answered to the patient's satisfaction.     Belva Crome III

## 2014-01-19 NOTE — CV Procedure (Signed)
   .  Left and Right Heart Catheterization with Coronary Angiography  Report  [name] [name]  63 y.o.  female 07-20-1951  Procedure Date: 01/19/2014 Referring Physician: Dorris Carnes, M.D. Primary Cardiologist:: Dorris Carnes, M.D.  INDICATIONS: Increasing dyspnea on exertion, upper chest tightness with radiation into the jaw. The test is been requested to rule out obstructive coronary disease.  PROCEDURE: 1. Left heart catheterization; 2. Right heart catheterization; 3. Coronary angiography; 4. Left ventriculography  CONSENT:  The risks, benefits, and details of the procedure were explained in detail to the patient. Risks including death, stroke, heart attack, kidney injury, allergy, limb ischemia, bleeding and radiation injury were discussed.  The patient verbalized understanding and wanted to proceed.  Informed written consent was obtained.  PROCEDURE TECHNIQUE:  After Xylocaine anesthesia a 5 French Slender sheath was placed in the right radial artery with an angiocath and the modified Seldinger technique. An 18-gauge Angiocath in the right antecubital vein was exchanged for a 5 [place] vascular sheath using double glove technique. Coronary angiography was done using a 5 F JR 4 and JL 3.5 catheter.  Left ventriculography was done using the JR 4 catheter and hand injection. Right heart catheterization was performed using a 5 French balloon tip catheter from the right antecubital without difficulty.  Coronary angiography was very difficult do to subclavian tortuosity and short ascending aorta making it easy for the catheter to prolapse into the descending aorta. We will ultimately successful with angiographic imaging that she had PCI of the become a possibility, femoral approach would be the preferred access route.   CONTRAST:  Total of 150 cc.  COMPLICATIONS:  None   HEMODYNAMICS:  Aortic pressure 131/66 mmHg; LV pressure 137/6 mmHg; LVEDP 16 mm mercury; RA 6 mmHg; RV 42/5 mmHg; PA 42/17  mmHg; PCWP(mean) 14 mm mercury; Cardiac Output 5.91 L per minute  ANGIOGRAPHIC DATA:   The left main coronary artery is widely patent.  The left anterior descending artery is tortuous, widely patent, with no significant obstruction noted. The ostial LAD contains eccentric 30% narrowing  The left circumflex artery is widely patent. The vessel is dominant  The right coronary artery is nondominant and normal.   LEFT VENTRICULOGRAM:  Left ventricular angiogram was done in the 30 RAO projection and revealed normal cavity size with EF of 70%   IMPRESSIONS:  1. Widely patent coronary arteries  2. Normal left ventricular systolic function and hemodynamics  3. Mild to moderate pulmonary hypertension with PA pressure of 42/17 mmHg   RECOMMENDATION:  It appears that the predominant cardiovascular disturbance is mild to moderate pulmonary hypertension. No significant coronary disease is noted.

## 2014-01-19 NOTE — H&P (View-Only) (Signed)
HPI Patient is a 63 year old with a history of HTN and HL and sleep apnea. Cath in 2005 with normal coronary arteries. I saw her in November 2014 She was seen by Glynn Octave recently  Complained of jaw pain and chest tightness Occur only  With exertion.  None at rest  Began a couple months ago. Seems to be progressive.   No episodes at rest or at night.   Uses CPAP at night   Allergies  Allergen Reactions  . Amlodipine Besylate Swelling  . Lisinopril-Hydrochlorothiazide Cough    REACTION: cough-- changed to arb  . Propoxyphene Hcl Itching  . Valsartan Other (See Comments)    REACTION:  "sleep more the next day after I took it; it made me real tired"  . Metronidazole     REACTION: "just didn't work; my body never did heal from it"    Current Outpatient Prescriptions  Medication Sig Dispense Refill  . acyclovir (ZOVIRAX) 200 MG capsule Take 2 capsules (400 mg total) by mouth 2 (two) times daily.  360 capsule  3  . albuterol (PROVENTIL HFA;VENTOLIN HFA) 108 (90 BASE) MCG/ACT inhaler Inhale 2 puffs into the lungs every 4 (four) hours as needed. For shortness of breath  3 Inhaler  3  . albuterol (PROVENTIL) (2.5 MG/3ML) 0.083% nebulizer solution Take 3 mLs (2.5 mg total) by nebulization every 6 (six) hours as needed for wheezing or shortness of breath.  120 mL  12  . allopurinol (ZYLOPRIM) 300 MG tablet TAKE ONE (1) TABLET EACH DAY  90 tablet  3  . ammonium lactate (LAC-HYDRIN) 12 % lotion APPLY TO BOTTOM OF LEFT FOOT 3 TO 4 TIMES DAILY AS NEEDED FOR DRY SKIN  400 g  5  . aspirin EC 81 MG tablet Take 81 mg by mouth daily.       [name] atorvastatin (LIPITOR) 20 MG tablet TAKE ONE (1) TABLET EACH DAY  90 tablet  3  . azelastine (ASTELIN) 137 MCG/SPRAY nasal spray PLACE 2 SPRAYS INTO THE NOSE DAILY AS   NEEDED USE IN EACH NOSTRIL ASDIRECTED  90 mL  3  . baclofen (LIORESAL) 10 MG tablet TAKE 1/2 TO 1 TABLET BY MOUTH AT NIGHT AS NEEDED FOR NECK PAIN  30 tablet  12  . beclomethasone (QVAR) 80 MCG/ACT  inhaler INHALE 1 PUFF INTO THE LUNGS AS NEEDED FOR WHEEZING.  26.1 g  3  . brimonidine (ALPHAGAN P) 0.1 % SOLN Place 1 drop into the left eye 2 (two) times daily.        [name] EPINEPHrine (EPI-PEN) 0.3 mg/0.3 mL SOAJ Inject 0.3 mLs (0.3 mg total) into the muscle once.  1 Device  11  . ergocalciferol (VITAMIN D2) 50000 UNITS capsule Take 1 capsule (50,000 Units total) by mouth once a week.  4 capsule  12  . esomeprazole (NEXIUM) 40 MG capsule Take 1 capsule (40 mg total) by mouth daily.  90 capsule  3  . furosemide (LASIX) 20 MG tablet TAKE ONE (1) TABLET EACH DAY  90 tablet  3  . gabapentin (NEURONTIN) 300 MG capsule Take 2 tabs by mouth at night  180 capsule  3  . hydrochlorothiazide (MICROZIDE) 12.5 MG capsule Take 1 capsule (12.5 mg total) by mouth daily.  90 capsule  3  . HYDROcodone-acetaminophen (NORCO/VICODIN) 5-325 MG per tablet Take one half or one tablet every 8 hours as needed for neck pain  60 tablet  0  . Insulin Glargine (LANTUS) 100 UNIT/ML Solostar Pen INJECT  45 UNITS SUBCUTANEOUSLY DAILY      . Insulin Pen Needle 30G X 5 MM MISC by Does not apply route.      . latanoprost (XALATAN) 0.005 % ophthalmic solution PLACE 1 DROP INTO BOTH EYES AT BEDTIME  7.5 mL  3  . losartan (COZAAR) 100 MG tablet TAKE 1 TABLET BY MOUTH EVERY DAY  90 tablet  3  . metFORMIN (GLUCOPHAGE) 1000 MG tablet Take 1.5 tablet in the morning and 1 tablet in the evening  215 tablet  3  . metoprolol succinate (TOPROL-XL) 50 MG 24 hr tablet TAKE TWO TABLETS EVERY DAY  160 tablet  3  . montelukast (SINGULAIR) 10 MG tablet Take 1 tablet (10 mg total) by mouth daily.  90 tablet  3  . nitroGLYCERIN (NITROSTAT) 0.4 MG SL tablet Place 1 tablet (0.4 mg total) under the tongue every 5 (five) minutes as needed. For chest pain  30 tablet  2  . polyethylene glycol (MIRALAX / GLYCOLAX) packet Take 17 g by mouth daily as needed. For constipation      . promethazine-codeine (PHENERGAN WITH CODEINE) 6.25-10 MG/5ML syrup Take 5 mLs by  mouth every 6 (six) hours as needed for cough.  200 mL  0  . sertraline (ZOLOFT) 100 MG tablet Take 1.5 tablets (150 mg total) by mouth at bedtime.  135 tablet  3   No current facility-administered medications for this visit.    Past Medical History  Diagnosis Date  . Chronic cough     Now followed by pulmonary  . Sleep apnea     on CPAP 12  . HTN (hypertension)   . Dyslipidemia   . Gout   . History of colonoscopy   . Concussion 11/18/11    "fell at dr's office; hit head"  . Complication of anesthesia     "just wake up coughing; that's all"  . Heart murmur   . Asthma   . COPD (chronic obstructive pulmonary disease)   . Diabetes mellitus type II     "take insulin & pills"  . Blood transfusion   . GERD (gastroesophageal reflux disease)   . Arthritis   . Chronic lower back pain   . Vocal cord paralysis, unilateral partial     "right"  . Depression   . Peripheral neuropathy   . Angina   . Pneumonia     "i've had it once" (11/18/11)  . Exertional dyspnea   . Headache(784.0) 11/18/11    "I've had mild headaches the last couple days"  . Headache(784.0) 01/08/12    "pretty constant since 11/18/11's concussion"  . Syncope 06/16/2012    Past Surgical History  Procedure Laterality Date  . Colonoscopy w/ biopsies  11/21/2010    adenoma polyp--no hi grade dysplasia Dr Mann  . Treatment fistula anal    . Breast reduction surgery  04-21-2003  . Tubal ligation  1987  . Cardiac catheterization  03-02-2004  . Bronchoscopy  07-2007  . Knee arthroscopy  08/2000    right  . Shoulder arthroscopy  08/2000    right  . T-score  09/02/04    -0.54 (low risk currently)  . Total abdominal hysterectomy  10-07-2000  . Cesarean section  1987  . Shoulder arthroscopy w/ rotator cuff repair  10/18/2003    left  . Cataract extraction  1955; 1979    bilateral; right eye  . Eye surgery    . Breast biopsy      bilaterally  . Breast cyst   excision      right twice; left once  . Carpal tunnel release       left  . Anterior cervical decomp/discectomy fusion  01/15/2012    Procedure: ANTERIOR CERVICAL DECOMPRESSION/DISCECTOMY FUSION 3 LEVELS;  Surgeon: David S Jones, MD;  Location: [facility];  Service: Neurosurgery;  Laterality: N/A;  Anterior Cervical Decompression Discectomy Fusion Cerivcal three four, cervical four five, cervical five six, cervical six seven    Family History  Problem Relation Age of Onset  . Diabetes Mother   . Colon cancer Neg Hx     History   Social History  . Marital Status: Single    Spouse Name: N/A    Number of Children: 1  . Years of Education: 12   Occupational History  . unemployed    Social History Main Topics  . Smoking status: Former Smoker -- 0.50 packs/day for .5 years    Types: Cigarettes    Quit date: 09/03/1975  . Smokeless tobacco: Never Used  . Alcohol Use: No  . Drug Use: No  . Sexual Activity: Not Currently   Other Topics Concern  . Not on file   Social History Narrative   Approved for section 8 housing (12/03).         Health Care POA:    Emergency Contact: sister, catherine Cavallero (h)[phone]   End of Life Plan:    Who lives with you: Has a son who may be autistic and stays with her at times.   Any pets: none   Diet: Pt has a varied diet of protein, starch, and vegetables.   Exercise: Pt walks 1x a week with [place] parks/recs group for visually impaired.   Seatbelts: Pt reports wearing seatbelt when in vehicles.    Hobbies: walking, tv, church, eating          Review of Systems:  All systems reviewed.  They are negative to the above problem except as previously stated.  Vital Signs: BP 120/62  Pulse 64  Ht 5' 1" (1.549 m)  Wt 180 lb (81.647 kg)  BMI 34.03 kg/m2  Physical Exam Patient is in NAD HEENT:  Normocephalic, atraumatic. EOMI, PERRLA.  Neck: JVP is normal.  No bruits.  Lungs: Decreased airflow   No rales no wheezes.  Heart: Regular rate and rhythm. Normal S1, S2. No S3.   No significant murmurs. PMI  not displaced.  Abdomen:  Supple, nontender. Normal bowel sounds. No masses. No hepatomegaly.  Extremities:   Good distal pulses throughout. Tr lower extremity edema.  Musculoskeletal :moving all extremities.  Neuro:   alert and oriented x3.  CN II-XII grossly intact.  EKG;  SR  LAD  LAA  74 bpm Assessment and Plan:  Patient is a 62 yo with no known CAD  Recently in pulmonary clinic  Referred for symptoms that are very suspicious for exertional angina.  I discussed with patinet   I Am concerned about false negative with myoview  I would recommm R and L heart cath to define anatomy and pressures (given lung dz) Patinet understands risks, benefits  Agrees to proceed  Wants to find out what is going on.    2.  HTN  Continue current meds  3.  HL  Keep on statin  4.  COPD  Followed in pulmonary  COntinue inhalers.     

## 2014-01-19 NOTE — Discharge Instructions (Signed)

## 2014-01-20 NOTE — Telephone Encounter (Signed)
Spoke with pt, will discuss with dr Harrington Challenger tomorrow and let her know Pt agreed with this plan.

## 2014-01-20 NOTE — Telephone Encounter (Signed)
error 

## 2014-01-20 NOTE — Telephone Encounter (Signed)
New problem   Pt had a cath 01/19/14 and want to know does she need to make a follow up appt. Please advise pt

## 2014-01-21 ENCOUNTER — Ambulatory Visit (INDEPENDENT_AMBULATORY_CARE_PROVIDER_SITE_OTHER): Payer: Commercial Managed Care - HMO

## 2014-01-21 DIAGNOSIS — J309 Allergic rhinitis, unspecified: Secondary | ICD-10-CM

## 2014-01-21 NOTE — Telephone Encounter (Addendum)
Message     Reviewed cath. No signif CAD SOme increased filling pressures    She can try increasing lasix to 40 mg per day and then take 20 Meq KCL    WIll need f/u BMET in 10-14 days.    See if she notices a difference in symptoms.      Left message for pt to call

## 2014-01-26 MED ORDER — FUROSEMIDE 20 MG PO TABS: 40.0000 mg | ORAL_TABLET | Freq: Every day | ORAL | Status: DC

## 2014-01-26 MED ORDER — POTASSIUM CHLORIDE CRYS ER 20 MEQ PO TBCR: 20.0000 meq | EXTENDED_RELEASE_TABLET | Freq: Every day | ORAL | Status: DC

## 2014-01-26 NOTE — Telephone Encounter (Signed)
New message    Need to discuss after having heart cath . What's the next step .

## 2014-01-26 NOTE — Telephone Encounter (Signed)
Spoke with pt, Aware of dr Harrington Challenger recommendation  Script sent to the pharm and labs ordered. She will let us know how she is doing.

## 2014-01-27 ENCOUNTER — Ambulatory Visit (INDEPENDENT_AMBULATORY_CARE_PROVIDER_SITE_OTHER): Payer: Commercial Managed Care - HMO

## 2014-01-27 DIAGNOSIS — J309 Allergic rhinitis, unspecified: Secondary | ICD-10-CM

## 2014-02-01 ENCOUNTER — Encounter: Payer: Self-pay | Admitting: Internal Medicine

## 2014-02-03 ENCOUNTER — Ambulatory Visit: Payer: Commercial Managed Care - HMO

## 2014-02-04 ENCOUNTER — Ambulatory Visit (INDEPENDENT_AMBULATORY_CARE_PROVIDER_SITE_OTHER): Payer: Commercial Managed Care - HMO

## 2014-02-04 DIAGNOSIS — J309 Allergic rhinitis, unspecified: Secondary | ICD-10-CM

## 2014-02-09 ENCOUNTER — Encounter: Payer: Self-pay | Admitting: Family Medicine

## 2014-02-09 ENCOUNTER — Ambulatory Visit (INDEPENDENT_AMBULATORY_CARE_PROVIDER_SITE_OTHER): Payer: Commercial Managed Care - HMO | Admitting: Family Medicine

## 2014-02-09 VITALS — BP 123/53 | HR 85 | Temp 98.7°F | Ht 61.0 in | Wt 177.8 lb

## 2014-02-09 DIAGNOSIS — E119 Type 2 diabetes mellitus without complications: Secondary | ICD-10-CM

## 2014-02-09 DIAGNOSIS — E785 Hyperlipidemia, unspecified: Secondary | ICD-10-CM

## 2014-02-09 DIAGNOSIS — R55 Syncope and collapse: Secondary | ICD-10-CM

## 2014-02-09 DIAGNOSIS — R609 Edema, unspecified: Secondary | ICD-10-CM

## 2014-02-09 DIAGNOSIS — R079 Chest pain, unspecified: Secondary | ICD-10-CM

## 2014-02-09 DIAGNOSIS — M25579 Pain in unspecified ankle and joints of unspecified foot: Secondary | ICD-10-CM

## 2014-02-09 DIAGNOSIS — I10 Essential (primary) hypertension: Secondary | ICD-10-CM

## 2014-02-09 LAB — POCT GLYCOSYLATED HEMOGLOBIN (HGB A1C): Hemoglobin A1C: 8

## 2014-02-09 MED ORDER — SALICYLIC ACID 0.5 % EX CREA: TOPICAL_CREAM | CUTANEOUS | Status: DC

## 2014-02-09 MED ORDER — METFORMIN HCL 1000 MG PO TABS: ORAL_TABLET | ORAL | Status: DC

## 2014-02-09 NOTE — Progress Notes (Signed)
   Subjective:    Patient ID: [name] [name], female    DOB: 1951/01/30, 63 y.o.   MRN: [id number]  HPI #1. Chest pain and neck pain. She saw a cardiologist and had a catheterization done which was negative for obstructive disease. She still has questions about why she's having occasional chest pain radiating into her neck however. #2. Continued problems with her left foot. Does not have the money right now to see the podiatrist. She's been wearing her diabetic orthopedic shoes with some help but continues to have large callus formation on the bottom of her foot which makes it difficult to do much walking. #3. Diabetes mellitus followup. Continues on her insulin. She's had a little bit of increased gas recently. Wonders if she can decrease her metformin. #4. We have placed her on the fluid pill and that seems to help her quite a bit with her lower extremity swelling.   Review of Systems Has lost some weight but it was intentional in part related to fluid loss. No fever, sweats, chills. See history of present illness above for additional pertinent review of systems.    Objective:   Physical Exam Vital signs reviewed. GENERAL: Well-developed, well-nourished, no acute distress. CARDIOVASCULAR: Regular rate and rhythm no murmur gallop or rub LUNGS: Clear to auscultation bilaterally, no rales or wheeze. ABDOMEN: Soft positive bowel sounds NEURO: Decreased hearing. Bilateral decreased soft touch sensation from the shin down. Also some decrease in soft and sensation bilateral hands. She walks with a walker. Legally blind.. MSK: Movement of extremity x 4. Left foot plantar portion reveals a quarter-sized callus area mildly tender to palpation. Otherwise foot exam is negative for any callus or open sores area         Assessment & Plan:

## 2014-02-09 NOTE — Assessment & Plan Note (Signed)
We discussed this at length. Given her recent negative cardiac catheter I don't think there is any heart issues here. Could be some reflux but also could be related to her asthma or anxiety. Tried to reassure her. Not sure we'll get to the bottom of this.

## 2014-02-09 NOTE — Assessment & Plan Note (Signed)
A1c looks great today so will decrease her metformin from 2 and half tabs a day to 1-1/2 tab a day. Followup 3 months.

## 2014-02-10 ENCOUNTER — Ambulatory Visit (INDEPENDENT_AMBULATORY_CARE_PROVIDER_SITE_OTHER): Payer: Commercial Managed Care - HMO

## 2014-02-10 ENCOUNTER — Other Ambulatory Visit: Payer: Self-pay | Admitting: *Deleted

## 2014-02-10 ENCOUNTER — Other Ambulatory Visit: Payer: Self-pay | Admitting: Family Medicine

## 2014-02-10 DIAGNOSIS — J309 Allergic rhinitis, unspecified: Secondary | ICD-10-CM

## 2014-02-10 LAB — BASIC METABOLIC PANEL
BUN: 10 mg/dL (ref 6–23)
CO2: 31 mEq/L (ref 19–32)
Calcium: 9.7 mg/dL (ref 8.4–10.5)
Chloride: 97 mEq/L (ref 96–112)
Creat: 0.73 mg/dL (ref 0.50–1.10)
Glucose, Bld: 238 mg/dL — ABNORMAL HIGH (ref 70–99)
Potassium: 3.8 mEq/L (ref 3.5–5.3)
Sodium: 141 mEq/L (ref 135–145)

## 2014-02-10 LAB — TSH: TSH: 2.911 u[IU]/mL (ref 0.350–4.500)

## 2014-02-10 LAB — LDL CHOLESTEROL, DIRECT: Direct LDL: 112 mg/dL — ABNORMAL HIGH

## 2014-02-10 MED ORDER — SALICYLIC ACID 0.5 % EX CREA: TOPICAL_CREAM | CUTANEOUS | Status: DC

## 2014-02-10 MED ORDER — SALICYLIC ACID 17 % EX GEL: Freq: Two times a day (BID) | CUTANEOUS | Status: DC

## 2014-02-10 NOTE — Telephone Encounter (Signed)
Asked to clarify salicylic acid order, pharmacy could not find the cream. Since medication is for calluses sent in 17% gel.

## 2014-02-11 ENCOUNTER — Encounter: Payer: Self-pay | Admitting: Family Medicine

## 2014-02-11 ENCOUNTER — Ambulatory Visit: Payer: Commercial Managed Care - HMO

## 2014-02-11 NOTE — Assessment & Plan Note (Addendum)
Continue holding at 1:50 Discuss with allergy lab.

## 2014-02-12 NOTE — Assessment & Plan Note (Signed)
Good compliance and control 

## 2014-02-12 NOTE — Assessment & Plan Note (Signed)
Per cardiology 

## 2014-02-17 ENCOUNTER — Ambulatory Visit (INDEPENDENT_AMBULATORY_CARE_PROVIDER_SITE_OTHER): Payer: Commercial Managed Care - HMO

## 2014-02-17 DIAGNOSIS — J309 Allergic rhinitis, unspecified: Secondary | ICD-10-CM

## 2014-02-21 ENCOUNTER — Encounter: Payer: Self-pay | Admitting: Internal Medicine

## 2014-02-21 NOTE — Telephone Encounter (Signed)
New message    Patient calling discuss test results from Dr. Nori Riis office.

## 2014-02-21 NOTE — Telephone Encounter (Signed)
This encounter was created in error - please disregard.

## 2014-02-23 ENCOUNTER — Telehealth: Payer: Self-pay | Admitting: Internal Medicine

## 2014-02-23 DIAGNOSIS — G4733 Obstructive sleep apnea (adult) (pediatric): Secondary | ICD-10-CM

## 2014-02-23 NOTE — Telephone Encounter (Signed)
Order placed. Patient aware. Nothing further needed. 

## 2014-02-23 NOTE — Telephone Encounter (Signed)
[name] [name] DME order [facility] for replacement CPAP mask of choice and supplies      Dx OSA

## 2014-02-23 NOTE — Telephone Encounter (Signed)
Called spoke with patient who reported that she is due for new CPAP supplies >> nasal pillows, tubing, etc.  She is not yet due for new head gear.  She is requesting an order be sent to [facility].  Pt is aware CDY is not in the office this afternoon and is okay with a call back tomorrow.  Dr Annamaria Boots please advise, thank you.  **last ov 5.4.15 w/ CDY

## 2014-02-24 ENCOUNTER — Ambulatory Visit: Payer: Commercial Managed Care - HMO

## 2014-02-25 ENCOUNTER — Ambulatory Visit (INDEPENDENT_AMBULATORY_CARE_PROVIDER_SITE_OTHER): Payer: Commercial Managed Care - HMO

## 2014-02-25 DIAGNOSIS — J309 Allergic rhinitis, unspecified: Secondary | ICD-10-CM

## 2014-03-02 ENCOUNTER — Ambulatory Visit: Payer: Commercial Managed Care - HMO

## 2014-03-03 ENCOUNTER — Ambulatory Visit (INDEPENDENT_AMBULATORY_CARE_PROVIDER_SITE_OTHER): Payer: Commercial Managed Care - HMO

## 2014-03-03 DIAGNOSIS — J309 Allergic rhinitis, unspecified: Secondary | ICD-10-CM

## 2014-03-10 ENCOUNTER — Ambulatory Visit: Payer: Commercial Managed Care - HMO

## 2014-03-11 ENCOUNTER — Ambulatory Visit (INDEPENDENT_AMBULATORY_CARE_PROVIDER_SITE_OTHER): Payer: Commercial Managed Care - HMO

## 2014-03-11 DIAGNOSIS — J309 Allergic rhinitis, unspecified: Secondary | ICD-10-CM

## 2014-03-16 ENCOUNTER — Encounter: Payer: Self-pay | Admitting: Family Medicine

## 2014-03-16 ENCOUNTER — Ambulatory Visit (INDEPENDENT_AMBULATORY_CARE_PROVIDER_SITE_OTHER): Payer: Commercial Managed Care - HMO | Admitting: Family Medicine

## 2014-03-16 VITALS — BP 122/58 | HR 68 | Ht 65.0 in | Wt 179.4 lb

## 2014-03-16 DIAGNOSIS — E119 Type 2 diabetes mellitus without complications: Secondary | ICD-10-CM

## 2014-03-16 DIAGNOSIS — M25579 Pain in unspecified ankle and joints of unspecified foot: Secondary | ICD-10-CM

## 2014-03-16 DIAGNOSIS — M25571 Pain in right ankle and joints of right foot: Secondary | ICD-10-CM

## 2014-03-16 DIAGNOSIS — I10 Essential (primary) hypertension: Secondary | ICD-10-CM

## 2014-03-16 MED ORDER — NATEGLINIDE 60 MG PO TABS: 60.0000 mg | ORAL_TABLET | Freq: Two times a day (BID) | ORAL | Status: DC

## 2014-03-17 NOTE — Progress Notes (Signed)
   Subjective:    Patient ID: [name] [name], female    DOB: 10-22-1950, 63 y.o.   MRN: [id number]  HPI  : DM: Followup change in metformin dosage. She was having abdominal distention, diarrhea so we decreased her dose. She continues to have issues and would like to take the metformin off totally. #2. Continued chronic right foot pain. She's wearing her diabetic shoes. Not seeming to get much better. I have given her some ointment for her foot to see if we can get rid of some of the callus and that has not seemed to help either #3. Followup hypertension. No chest pain, no shortness of breath. Energy levels the same. Activity levels unchanged. No palpitations. No problems with her medicines. Review of Systems Pertinent review of systems: negative for fever or unusual weight change.     Objective:   Physical Exam  Vital signs reviewed. GENERAL: Well-developed, well-nourished, no acute distress. CARDIOVASCULAR: Regular rate and rhythm no murmur gallop or rub LUNGS: Clear to auscultation bilaterally, no rales or wheeze. ABDOMEN: Soft positive bowel sounds NEURO: decreased vision,B  LE and B hand neuropathy=----loss softtouch sensation MSK: Movement of extremity x 4. Right foot area under the metatarsal heads reveals a very firm callus area under the third metatarsal head. Tender to palpation. She has loss of transverse arch.        Assessment & Plan:

## 2014-03-17 NOTE — Assessment & Plan Note (Signed)
Pretty good control. We will continue current medicines and recheck some blood work and see her back in 4-6 weeks

## 2014-03-17 NOTE — Assessment & Plan Note (Signed)
Continued problems with this. Think she is go back to see the podiatrist. We've tried inserts, she has custom molded shoes, I've added metatarsal pads etc. and we seem to be getting nowhere.

## 2014-03-17 NOTE — Assessment & Plan Note (Signed)
Will stop the metformin for right now. She will start Starlix and see her back in 4-6 weeks. I doubt this will get her A1c where we needeto, I suspect she's not been taking her metformin regularly for a long time anyway so we may not be losing much by "discontinuing" it

## 2014-03-18 ENCOUNTER — Ambulatory Visit (INDEPENDENT_AMBULATORY_CARE_PROVIDER_SITE_OTHER): Payer: Commercial Managed Care - HMO

## 2014-03-18 ENCOUNTER — Telehealth: Payer: Self-pay | Admitting: *Deleted

## 2014-03-18 DIAGNOSIS — J309 Allergic rhinitis, unspecified: Secondary | ICD-10-CM

## 2014-03-18 NOTE — Telephone Encounter (Signed)
Patient called in and stated that her glucose is 243 and that it said "?keytones". She has been switched from Metformin to Starlix and that she does not know if that is the reason it says that. I am not familiar with a glucose machine saying that will fwd to Dr. Nori Riis

## 2014-03-18 NOTE — Telephone Encounter (Signed)
Dear [name] Team Tell her not to worry about the "ketones" part. If her sugar continues to be higher over next two days increase her lantus by FIVE additional usints a day. I think she was either taking 45 or 50 (depending on her mood!) so add 5 units (Oncce a day)if she continues to be high THANKS! [name]

## 2014-03-21 NOTE — Telephone Encounter (Signed)
LM with female for patient to call back 

## 2014-03-25 ENCOUNTER — Ambulatory Visit: Payer: Commercial Managed Care - HMO

## 2014-03-28 ENCOUNTER — Telehealth: Payer: Self-pay | Admitting: Family Medicine

## 2014-03-28 NOTE — Telephone Encounter (Signed)
[name] [name] called to say that inspite of her upping her units of medication her b/s is still high.  She got dehydrated this weekend and had sweet tea and regular Sprite.  She went to [facility] for a week with a group and had "quite a time".  She confessed to having french fries,cheeseburgers,hot dogs,pineapple upside down cake and cheese cake and lemon pudding which wasn't all that good.  Glucose reading was 251 this morning.  She said while she was away she had to go to the ED in [place] due to the dehydration.

## 2014-03-30 ENCOUNTER — Encounter: Payer: Self-pay | Admitting: Family Medicine

## 2014-03-30 ENCOUNTER — Ambulatory Visit (INDEPENDENT_AMBULATORY_CARE_PROVIDER_SITE_OTHER): Payer: Commercial Managed Care - HMO | Admitting: Family Medicine

## 2014-03-30 VITALS — BP 129/55 | HR 71 | Temp 97.9°F | Ht 61.0 in | Wt 178.6 lb

## 2014-03-30 DIAGNOSIS — E119 Type 2 diabetes mellitus without complications: Secondary | ICD-10-CM

## 2014-03-30 LAB — CBC WITH DIFFERENTIAL/PLATELET
Basophils Absolute: 0 10*3/uL (ref 0.0–0.1)
Basophils Relative: 0 % (ref 0–1)
Eosinophils Absolute: 0.1 10*3/uL (ref 0.0–0.7)
Eosinophils Relative: 1 % (ref 0–5)
HCT: 35.6 % — ABNORMAL LOW (ref 36.0–46.0)
Hemoglobin: 11.9 g/dL — ABNORMAL LOW (ref 12.0–15.0)
Lymphocytes Relative: 30 % (ref 12–46)
Lymphs Abs: 2.5 10*3/uL (ref 0.7–4.0)
MCH: 27.7 pg (ref 26.0–34.0)
MCHC: 33.4 g/dL (ref 30.0–36.0)
MCV: 83 fL (ref 78.0–100.0)
Monocytes Absolute: 0.4 10*3/uL (ref 0.1–1.0)
Monocytes Relative: 5 % (ref 3–12)
Neutro Abs: 5.3 10*3/uL (ref 1.7–7.7)
Neutrophils Relative %: 64 % (ref 43–77)
Platelets: 205 10*3/uL (ref 150–400)
RBC: 4.29 MIL/uL (ref 3.87–5.11)
RDW: 16 % — ABNORMAL HIGH (ref 11.5–15.5)
WBC: 8.3 10*3/uL (ref 4.0–10.5)

## 2014-03-30 LAB — BASIC METABOLIC PANEL
BUN: 8 mg/dL (ref 6–23)
CO2: 30 mEq/L (ref 19–32)
Calcium: 9.4 mg/dL (ref 8.4–10.5)
Chloride: 98 mEq/L (ref 96–112)
Creat: 0.71 mg/dL (ref 0.50–1.10)
Glucose, Bld: 216 mg/dL — ABNORMAL HIGH (ref 70–99)
Potassium: 3.9 mEq/L (ref 3.5–5.3)
Sodium: 138 mEq/L (ref 135–145)

## 2014-03-30 MED ORDER — SULFAMETHOXAZOLE-TMP DS 800-160 MG PO TABS: 1.0000 | ORAL_TABLET | Freq: Two times a day (BID) | ORAL | Status: DC

## 2014-03-31 ENCOUNTER — Ambulatory Visit (INDEPENDENT_AMBULATORY_CARE_PROVIDER_SITE_OTHER): Payer: Commercial Managed Care - HMO

## 2014-03-31 DIAGNOSIS — J309 Allergic rhinitis, unspecified: Secondary | ICD-10-CM

## 2014-04-05 MED ORDER — INSULIN GLARGINE 100 UNIT/ML ~~LOC~~ SOLN: SUBCUTANEOUS | Status: DC

## 2014-04-05 NOTE — Assessment & Plan Note (Signed)
She is much happier with her status of her gastrointestinal system off the metformin. Obviously she needs some additional coverage for her diabetes so will increase her insulin by 5-10 units and see her back in about 6 weeks. We discussed how to taper this up

## 2014-04-05 NOTE — Progress Notes (Signed)
   Subjective:    Patient ID: Erin Sons, female    DOB: [birth year], 63 y.o.   MRN: [id number]  HPI  Followup diabetes mellitus. We decreased her metformin secondary to gastrointestinal upset and her symptoms still did not resolve so at last visit we discontinued it. She's here for followup. A1c is elevated today on lab check. She feels okay. No increased urination, no increased thirst, no unusual weight change  Review of Systems No fever, sweats, chills.    Objective:   Physical Exam Vital signs are reviewed GENERAL: Well-developed slightly overweight female no acute distress. HEENT: Legally blind CV: Regular rate and rhythm without murmur ABDOMEN: Soft positive bowel sounds nontender nondistended EXTREMITY: There is slight nonpitting edema bilateral ankle. NEURO: Decreased sensation to soft touch bilaterally from mid lower leg down including all the foot. Both hands have decreased soft touch sensation particularly on the palmar aspect.       Assessment & Plan:

## 2014-04-07 ENCOUNTER — Ambulatory Visit (INDEPENDENT_AMBULATORY_CARE_PROVIDER_SITE_OTHER): Payer: Commercial Managed Care - HMO

## 2014-04-07 DIAGNOSIS — J309 Allergic rhinitis, unspecified: Secondary | ICD-10-CM

## 2014-04-11 ENCOUNTER — Encounter: Payer: Self-pay | Admitting: Family Medicine

## 2014-04-14 ENCOUNTER — Ambulatory Visit (INDEPENDENT_AMBULATORY_CARE_PROVIDER_SITE_OTHER): Payer: Commercial Managed Care - HMO

## 2014-04-14 DIAGNOSIS — J309 Allergic rhinitis, unspecified: Secondary | ICD-10-CM

## 2014-04-21 ENCOUNTER — Ambulatory Visit (INDEPENDENT_AMBULATORY_CARE_PROVIDER_SITE_OTHER): Payer: Commercial Managed Care - HMO

## 2014-04-21 DIAGNOSIS — J309 Allergic rhinitis, unspecified: Secondary | ICD-10-CM

## 2014-04-27 ENCOUNTER — Ambulatory Visit (INDEPENDENT_AMBULATORY_CARE_PROVIDER_SITE_OTHER): Payer: Commercial Managed Care - HMO | Admitting: Family Medicine

## 2014-04-27 ENCOUNTER — Ambulatory Visit (HOSPITAL_COMMUNITY)
Admission: RE | Admit: 2014-04-27 | Discharge: 2014-04-27 | Disposition: A | Discharge status: Home / Self Care | Payer: Medicare HMO | Source: Ambulatory Visit | Attending: Family Medicine | Admitting: Family Medicine

## 2014-04-27 ENCOUNTER — Encounter: Payer: Self-pay | Admitting: Family Medicine

## 2014-04-27 VITALS — BP 145/56 | HR 68 | Temp 98.6°F | Wt 179.5 lb

## 2014-04-27 DIAGNOSIS — M542 Cervicalgia: Secondary | ICD-10-CM | POA: Diagnosis present

## 2014-04-27 DIAGNOSIS — Z981 Arthrodesis status: Secondary | ICD-10-CM | POA: Diagnosis not present

## 2014-04-27 DIAGNOSIS — M4322 Fusion of spine, cervical region: Secondary | ICD-10-CM

## 2014-04-27 DIAGNOSIS — M539 Dorsopathy, unspecified: Secondary | ICD-10-CM

## 2014-04-27 DIAGNOSIS — E119 Type 2 diabetes mellitus without complications: Secondary | ICD-10-CM

## 2014-04-27 LAB — POCT GLYCOSYLATED HEMOGLOBIN (HGB A1C): Hemoglobin A1C: 9.2

## 2014-04-28 ENCOUNTER — Encounter: Payer: Self-pay | Admitting: Family Medicine

## 2014-04-28 ENCOUNTER — Ambulatory Visit (INDEPENDENT_AMBULATORY_CARE_PROVIDER_SITE_OTHER): Payer: Commercial Managed Care - HMO

## 2014-04-28 ENCOUNTER — Telehealth: Payer: Self-pay | Admitting: Family Medicine

## 2014-04-28 DIAGNOSIS — J309 Allergic rhinitis, unspecified: Secondary | ICD-10-CM

## 2014-04-28 MED ORDER — NATEGLINIDE 60 MG PO TABS: ORAL_TABLET | ORAL | Status: DC

## 2014-04-28 NOTE — Telephone Encounter (Signed)
Pt called because she said that Dr. Nori Riis was increasing one of her medications and that she would fax this to her pharmacy. The pharmacy said that they didn't have any faxes for medications yet. jw

## 2014-04-28 NOTE — Telephone Encounter (Signed)
Dear [name] Team [facility] tell her she was TOO FAST for me--I have sent it in now thoiugh! THANKS! [name]

## 2014-04-29 ENCOUNTER — Encounter: Payer: Self-pay | Admitting: Family Medicine

## 2014-04-29 ENCOUNTER — Other Ambulatory Visit: Payer: Self-pay

## 2014-04-29 DIAGNOSIS — Z1231 Encounter for screening mammogram for malignant neoplasm of breast: Secondary | ICD-10-CM

## 2014-04-29 NOTE — Assessment & Plan Note (Signed)
Repeat xray

## 2014-04-29 NOTE — Assessment & Plan Note (Signed)
She does not wantto go back on metformin. Will likely need more insulin coverage but she wanst totry increase starlix first. Increease to 120 bud. F/u 4 weeks Call if sugars still high

## 2014-04-29 NOTE — Progress Notes (Signed)
   Subjective:    Patient ID: [name] [name], female    DOB: 10-24-1950, 63 y.o.   MRN: [id number]  HPI  1. F/u DM Sugars running hi --200-345 with occ 500. Feeling better off the metformin (less stomach upset)  2. Now having some constipation alternating w her diarrhea 9since stopping metformin0. Not taking her fiber daily 3. Financial stressors affecting her ability to eat properly, esop toward end of month.4. Neck hurting more. She is worried some of her hardware is "loos" and wants to check it w [employer]  Review of Systems Pertinent review of systems: negative for fever or unusual weight change.     Objective:   Physical Exam Vital signs reviewed. GENERAL: Well-developed, well-nourished, no acute distress. CARDIOVASCULAR: Regular rate and rhythm no murmur gallop or rub LUNGS: Clear to auscultation bilaterally, no rales or wheeze. ABDOMEN: Soft positive bowel sounds NEURO: legally blind. Decreased sensation to soft touch B hands and feet NECK: nontender to percussion. Limited ROM in flexion and extension c/w known fusion        Assessment & Plan:

## 2014-05-03 ENCOUNTER — Telehealth: Payer: Self-pay | Admitting: Internal Medicine

## 2014-05-03 ENCOUNTER — Encounter: Payer: Self-pay | Admitting: Internal Medicine

## 2014-05-03 NOTE — Telephone Encounter (Signed)
In your 01/03/14 notes: Allergy vaccine 1:50 GH,holding. In revision history: Cont. Holding at 1:50. Discuss with all. Lab. Pt. Instructions: Ok to cont. All. shots. I will talk with all.lab about increasing the strength of your shots as tolerated. I didn't get a rx and if you talked to me about it,I don't recall the conversation.(Unless you talked to [place] or [place]. They would have relayed the mess. I don't remember that  Either.)  Please advise on what you want [place] to do. Thanks, [employer]

## 2014-05-03 NOTE — Telephone Encounter (Signed)
Script to Allergy Lab- ok to advance vaccine to 1:10 given here, per protocol

## 2014-05-03 NOTE — Telephone Encounter (Signed)
Forwarded message to [employer] in allergy lab.

## 2014-05-03 NOTE — Telephone Encounter (Signed)
Dr. Annamaria Boots gave me the rx. I will make up vac. So I'll have it when she comes in 04/04/14.

## 2014-05-04 ENCOUNTER — Ambulatory Visit (INDEPENDENT_AMBULATORY_CARE_PROVIDER_SITE_OTHER): Payer: Commercial Managed Care - HMO

## 2014-05-04 DIAGNOSIS — J309 Allergic rhinitis, unspecified: Secondary | ICD-10-CM

## 2014-05-12 ENCOUNTER — Ambulatory Visit (INDEPENDENT_AMBULATORY_CARE_PROVIDER_SITE_OTHER): Payer: Commercial Managed Care - HMO

## 2014-05-12 DIAGNOSIS — J309 Allergic rhinitis, unspecified: Secondary | ICD-10-CM

## 2014-05-19 ENCOUNTER — Ambulatory Visit: Payer: Commercial Managed Care - HMO

## 2014-05-20 ENCOUNTER — Ambulatory Visit (INDEPENDENT_AMBULATORY_CARE_PROVIDER_SITE_OTHER): Payer: Commercial Managed Care - HMO

## 2014-05-20 DIAGNOSIS — J309 Allergic rhinitis, unspecified: Secondary | ICD-10-CM

## 2014-05-25 ENCOUNTER — Encounter (HOSPITAL_COMMUNITY): Payer: Self-pay | Admitting: Emergency Medicine

## 2014-05-25 ENCOUNTER — Emergency Department (HOSPITAL_COMMUNITY): Payer: Medicare HMO

## 2014-05-25 ENCOUNTER — Emergency Department (HOSPITAL_COMMUNITY)
Admission: EM | Admit: 2014-05-25 | Discharge: 2014-05-25 | Disposition: A | Discharge status: Home / Self Care | Payer: Medicare HMO | Attending: Emergency Medicine | Admitting: Emergency Medicine

## 2014-05-25 ENCOUNTER — Ambulatory Visit
Admission: RE | Admit: 2014-05-25 | Discharge: 2014-05-25 | Disposition: A | Discharge status: Home / Self Care | Payer: Commercial Managed Care - HMO | Source: Ambulatory Visit

## 2014-05-25 DIAGNOSIS — Z79899 Other long term (current) drug therapy: Secondary | ICD-10-CM | POA: Insufficient documentation

## 2014-05-25 DIAGNOSIS — F329 Major depressive disorder, single episode, unspecified: Secondary | ICD-10-CM | POA: Diagnosis not present

## 2014-05-25 DIAGNOSIS — M109 Gout, unspecified: Secondary | ICD-10-CM | POA: Diagnosis not present

## 2014-05-25 DIAGNOSIS — Z794 Long term (current) use of insulin: Secondary | ICD-10-CM | POA: Insufficient documentation

## 2014-05-25 DIAGNOSIS — Z8701 Personal history of pneumonia (recurrent): Secondary | ICD-10-CM | POA: Insufficient documentation

## 2014-05-25 DIAGNOSIS — R51 Headache: Secondary | ICD-10-CM | POA: Insufficient documentation

## 2014-05-25 DIAGNOSIS — F3289 Other specified depressive episodes: Secondary | ICD-10-CM | POA: Insufficient documentation

## 2014-05-25 DIAGNOSIS — E119 Type 2 diabetes mellitus without complications: Secondary | ICD-10-CM | POA: Insufficient documentation

## 2014-05-25 DIAGNOSIS — E785 Hyperlipidemia, unspecified: Secondary | ICD-10-CM | POA: Diagnosis not present

## 2014-05-25 DIAGNOSIS — R011 Cardiac murmur, unspecified: Secondary | ICD-10-CM | POA: Diagnosis not present

## 2014-05-25 DIAGNOSIS — IMO0002 Reserved for concepts with insufficient information to code with codable children: Secondary | ICD-10-CM | POA: Insufficient documentation

## 2014-05-25 DIAGNOSIS — Z9981 Dependence on supplemental oxygen: Secondary | ICD-10-CM | POA: Diagnosis not present

## 2014-05-25 DIAGNOSIS — J449 Chronic obstructive pulmonary disease, unspecified: Secondary | ICD-10-CM | POA: Diagnosis not present

## 2014-05-25 DIAGNOSIS — Z9889 Other specified postprocedural states: Secondary | ICD-10-CM | POA: Insufficient documentation

## 2014-05-25 DIAGNOSIS — R739 Hyperglycemia, unspecified: Secondary | ICD-10-CM

## 2014-05-25 DIAGNOSIS — G4730 Sleep apnea, unspecified: Secondary | ICD-10-CM | POA: Insufficient documentation

## 2014-05-25 DIAGNOSIS — G609 Hereditary and idiopathic neuropathy, unspecified: Secondary | ICD-10-CM | POA: Insufficient documentation

## 2014-05-25 DIAGNOSIS — I10 Essential (primary) hypertension: Secondary | ICD-10-CM | POA: Insufficient documentation

## 2014-05-25 DIAGNOSIS — Z87891 Personal history of nicotine dependence: Secondary | ICD-10-CM | POA: Insufficient documentation

## 2014-05-25 DIAGNOSIS — Z1231 Encounter for screening mammogram for malignant neoplasm of breast: Secondary | ICD-10-CM

## 2014-05-25 DIAGNOSIS — G8929 Other chronic pain: Secondary | ICD-10-CM | POA: Diagnosis not present

## 2014-05-25 DIAGNOSIS — R42 Dizziness and giddiness: Secondary | ICD-10-CM | POA: Diagnosis not present

## 2014-05-25 DIAGNOSIS — J4489 Other specified chronic obstructive pulmonary disease: Secondary | ICD-10-CM | POA: Insufficient documentation

## 2014-05-25 DIAGNOSIS — E860 Dehydration: Secondary | ICD-10-CM | POA: Diagnosis not present

## 2014-05-25 DIAGNOSIS — Z87828 Personal history of other (healed) physical injury and trauma: Secondary | ICD-10-CM | POA: Insufficient documentation

## 2014-05-25 DIAGNOSIS — K219 Gastro-esophageal reflux disease without esophagitis: Secondary | ICD-10-CM | POA: Diagnosis not present

## 2014-05-25 DIAGNOSIS — Z792 Long term (current) use of antibiotics: Secondary | ICD-10-CM | POA: Insufficient documentation

## 2014-05-25 DIAGNOSIS — I209 Angina pectoris, unspecified: Secondary | ICD-10-CM | POA: Insufficient documentation

## 2014-05-25 LAB — COMPREHENSIVE METABOLIC PANEL
ALT: 39 U/L — ABNORMAL HIGH (ref 0–35)
AST: 41 U/L — ABNORMAL HIGH (ref 0–37)
Albumin: 3.7 g/dL (ref 3.5–5.2)
Alkaline Phosphatase: 123 U/L — ABNORMAL HIGH (ref 39–117)
Anion gap: 11 (ref 5–15)
BUN: 14 mg/dL (ref 6–23)
CO2: 29 mEq/L (ref 19–32)
Calcium: 9.5 mg/dL (ref 8.4–10.5)
Chloride: 100 mEq/L (ref 96–112)
Creatinine, Ser: 0.66 mg/dL (ref 0.50–1.10)
GFR calc Af Amer: >90 mL/min (ref >90)
GFR calc non Af Amer: >90 mL/min (ref >90)
Glucose, Bld: 349 mg/dL — ABNORMAL HIGH (ref 70–99)
Potassium: 4.1 mEq/L (ref 3.7–5.3)
Sodium: 140 mEq/L (ref 137–147)
Total Bilirubin: <0.2 mg/dL — ABNORMAL LOW (ref 0.3–1.2)
Total Protein: 7.9 g/dL (ref 6.0–8.3)

## 2014-05-25 LAB — URINALYSIS, ROUTINE W REFLEX MICROSCOPIC
Bilirubin Urine: NEGATIVE
Glucose, UA: 100 mg/dL — AB
Hgb urine dipstick: NEGATIVE
Ketones, ur: NEGATIVE mg/dL
Leukocytes, UA: NEGATIVE
Nitrite: NEGATIVE
Protein, ur: NEGATIVE mg/dL
Specific Gravity, Urine: 1.019 (ref 1.005–1.030)
Urobilinogen, UA: 1 mg/dL (ref 0.0–1.0)
pH: 7 (ref 5.0–8.0)

## 2014-05-25 LAB — CBC
HCT: 37.2 % (ref 36.0–46.0)
Hemoglobin: 11.9 g/dL — ABNORMAL LOW (ref 12.0–15.0)
MCH: 28.1 pg (ref 26.0–34.0)
MCHC: 32 g/dL (ref 30.0–36.0)
MCV: 87.9 fL (ref 78.0–100.0)
Platelets: 166 10*3/uL (ref 150–400)
RBC: 4.23 MIL/uL (ref 3.87–5.11)
RDW: 14.6 % (ref 11.5–15.5)
WBC: 7 10*3/uL (ref 4.0–10.5)

## 2014-05-25 LAB — CBG MONITORING, ED
Glucose-Capillary: 319 mg/dL — ABNORMAL HIGH (ref 70–99)
Glucose-Capillary: 193 mg/dL — ABNORMAL HIGH (ref 70–99)
Glucose-Capillary: 148 mg/dL — ABNORMAL HIGH (ref 70–99)

## 2014-05-25 MED ORDER — LOSARTAN POTASSIUM 50 MG PO TABS: 100.0000 mg | ORAL_TABLET | Freq: Every day | ORAL | Status: DC

## 2014-05-25 MED ORDER — LOSARTAN POTASSIUM 50 MG PO TABS
100.0000 mg | ORAL_TABLET | Freq: Every day | ORAL | Status: DC
  Administered 2014-05-25: 100 mg via ORAL
  Filled 2014-05-25: qty 2

## 2014-05-25 MED ORDER — SODIUM CHLORIDE 0.9 % IV BOLUS (SEPSIS)
1000.0000 mL | Freq: Once | INTRAVENOUS | Status: AC
  Administered 2014-05-25: 1000 mL via INTRAVENOUS

## 2014-05-25 MED ORDER — ACETAMINOPHEN 500 MG PO TABS
1000.0000 mg | ORAL_TABLET | Freq: Once | ORAL | Status: AC
  Administered 2014-05-25: 1000 mg via ORAL
  Filled 2014-05-25: qty 2

## 2014-05-25 NOTE — ED Notes (Signed)
Pt CBG 148. RN notified

## 2014-05-25 NOTE — ED Provider Notes (Signed)
CSN: [id number]     Arrival date & time 05/25/14  1258 History   First MD Initiated Contact with Patient 05/25/14 1718     Chief Complaint  Patient presents with  . Hyperglycemia  . Headache    Patient is a 63 y.o. female presenting with hyperglycemia and headaches. The history is provided by the patient.  Hyperglycemia Blood sugar level PTA:  400s Duration:  1 day Timing:  Constant Progression:  Improving Chronicity:  Recurrent Diabetes status:  Controlled with insulin and controlled with oral medications Time since last antidiabetic medication: last night, took meds, insulin at night, pills during day. Context: not change in medication, not new diabetes diagnosis, not noncompliance, not recent change in diet and not recent illness   Associated symptoms: dehydration, increased thirst and polyuria   Associated symptoms: no abdominal pain, no altered mental status, no blurred vision, no chest pain, no diaphoresis, no dizziness, no dysuria, no fever, no nausea, no shortness of breath, no vomiting and no weakness   Risk factors: no recent steroid use  History of diabetic ketoacidosis: unsure.   Headache Pain location:  Generalized Quality:  Dull Radiates to:  Does not radiate Severity currently:  8/10 Onset quality:  Gradual Duration:  1 day Timing:  Constant Similar to prior headaches: yes (feels like HAs wehn she is dehydrated)   Context: not exposure to bright light and not loud noise   Associated symptoms: no abdominal pain, no back pain, no blurred vision, no congestion, no cough, no diarrhea, no dizziness, no fever, no nausea, no near-syncope, no neck pain, no neck stiffness, no numbness, no paresthesias, no photophobia, no URI, no vomiting and no weakness    Pt did not take home BP meds this AM.  Normally BP at home is 150-160s SBP.   No recent illnesses.  Denies dysuria, hematuria, CP, congestion, SOB, cough, fevers, chills, n/v/d  Past Medical History  Diagnosis Date  .  Chronic cough     Now followed by pulmonary  . Sleep apnea     on CPAP 12  . HTN (hypertension)   . Dyslipidemia   . Gout   . History of colonoscopy   . Concussion 11/18/11    "fell at dr's office; hit head"  . Complication of anesthesia     "just wake up coughing; that's all"  . Heart murmur   . Asthma   . COPD (chronic obstructive pulmonary disease)   . Diabetes mellitus type II     "take insulin & pills"  . Blood transfusion   . GERD (gastroesophageal reflux disease)   . Arthritis   . Chronic lower back pain   . Vocal cord paralysis, unilateral partial     "right"  . Depression   . Peripheral neuropathy   . Angina   . Pneumonia     "i've had it once" (11/18/11)  . Exertional dyspnea   . Headache(784.0) 11/18/11    "I've had mild headaches the last couple days"  . Headache(784.0) 01/08/12    "pretty constant since 11/18/11's concussion"  . Syncope 06/16/2012   Past Surgical History  Procedure Laterality Date  . Colonoscopy w/ biopsies  11/21/2010    adenoma polyp--no hi grade dysplasia Dr Collene Mares  . Treatment fistula anal    . Breast reduction surgery  04-21-2003  . Tubal ligation  1987  . Cardiac catheterization  03-02-2004  . Bronchoscopy  07-2007  . Knee arthroscopy  08/2000    right  . Shoulder arthroscopy  08/2000    right  . T-score  09/02/04    -0.54 (low risk currently)  . Total abdominal hysterectomy  10-07-2000  . Cesarean section  1987  . Shoulder arthroscopy w/ rotator cuff repair  10/18/2003    left  . Cataract extraction  1955; 1979    bilateral; right eye  . Eye surgery    . Breast biopsy      bilaterally  . Breast cyst excision      right twice; left once  . Carpal tunnel release      left  . Anterior cervical decomp/discectomy fusion  01/15/2012    Procedure: ANTERIOR CERVICAL DECOMPRESSION/DISCECTOMY FUSION 3 LEVELS;  Surgeon: Eustace Moore, MD;  Location: [facility];  Service: Neurosurgery;  Laterality: N/A;  Anterior Cervical Decompression  Discectomy Fusion Cerivcal three four, cervical four five, cervical five six, cervical six seven   Family History  Problem Relation Age of Onset  . Diabetes Mother   . Colon cancer Neg Hx    History  Substance Use Topics  . Smoking status: Former Smoker -- 0.50 packs/day for .5 years    Types: Cigarettes    Quit date: 09/03/1975  . Smokeless tobacco: Never Used  . Alcohol Use: No   OB History   Grav Para Term Preterm Abortions TAB SAB Ect Mult Living                 Review of Systems  Constitutional: Negative for fever, chills and diaphoresis.  HENT: Negative for congestion.   Eyes: Negative for blurred vision and photophobia.  Respiratory: Negative for cough, shortness of breath and wheezing.   Cardiovascular: Negative for chest pain, leg swelling and near-syncope.  Gastrointestinal: Negative for nausea, vomiting, abdominal pain and diarrhea.  Endocrine: Positive for polydipsia and polyuria.  Genitourinary: Negative for dysuria.  Musculoskeletal: Negative for back pain, neck pain and neck stiffness.  Skin: Negative for rash.  Neurological: Positive for light-headedness and headaches. Negative for dizziness, facial asymmetry, weakness, numbness and paresthesias.  All other systems reviewed and are negative.     Allergies  Amlodipine besylate; Lisinopril-hydrochlorothiazide; Propoxyphene hcl; Valsartan; Bee venom; and Metronidazole  Home Medications   Prior to Admission medications   Medication Sig Start Date End Date Taking? Authorizing Provider  acyclovir (ZOVIRAX) 200 MG capsule Take 200 mg by mouth 2 (two) times daily.    Historical Provider, MD  albuterol (PROVENTIL HFA;VENTOLIN HFA) 108 (90 BASE) MCG/ACT inhaler Inhale 1-2 puffs into the lungs every 6 (six) hours as needed for wheezing or shortness of breath.    Historical Provider, MD  albuterol (PROVENTIL) (2.5 MG/3ML) 0.083% nebulizer solution Take 2.5 mg by nebulization every 6 (six) hours as needed for wheezing  or shortness of breath.    Historical Provider, MD  allopurinol (ZYLOPRIM) 300 MG tablet Take 300 mg by mouth every morning.    Historical Provider, MD  ammonium lactate (LAC-HYDRIN) 12 % lotion Apply 1 application topically 2 (two) times daily as needed for dry skin.    Historical Provider, MD  atorvastatin (LIPITOR) 20 MG tablet Take 20 mg by mouth every evening.    Historical Provider, MD  azelastine (ASTELIN) 0.1 % nasal spray Place 2 sprays into both nostrils daily. Use in each nostril as directed    Historical Provider, MD  baclofen (LIORESAL) 10 MG tablet Take 5-10 mg by mouth daily as needed for muscle spasms.    Historical Provider, MD  beclomethasone (QVAR) 80 MCG/ACT inhaler Inhale 2 puffs  into the lungs daily.    Historical Provider, MD  brimonidine (ALPHAGAN P) 0.1 % SOLN Place 1 drop into the left eye 2 (two) times daily.     Historical Provider, MD  EPINEPHrine (EPIPEN) 0.3 mg/0.3 mL IJ SOAJ injection Inject 0.3 mg into the muscle once as needed (for allergic reaction).    Historical Provider, MD  esomeprazole (NEXIUM) 40 MG capsule Take 40 mg by mouth daily.    Historical Provider, MD  ferrous sulfate 325 (65 FE) MG tablet Take 325 mg by mouth daily with breakfast.    Historical Provider, MD  furosemide (LASIX) 20 MG tablet Take 2 tablets (40 mg total) by mouth daily. 01/26/14   Fay Records, MD  gabapentin (NEURONTIN) 300 MG capsule Take 600 mg by mouth at bedtime.    Historical Provider, MD  HYDROcodone-acetaminophen (NORCO/VICODIN) 5-325 MG per tablet Take 1 tablet by mouth every 6 (six) hours as needed for moderate pain.    Historical Provider, MD  insulin glargine (LANTUS) 100 UNIT/ML injection Inject 50-55 units into skin daily 04/05/14   Dickie La, MD  latanoprost (XALATAN) 0.005 % ophthalmic solution Place 1 drop into both eyes at bedtime.    Historical Provider, MD  losartan (COZAAR) 100 MG tablet Take 100 mg by mouth daily.    Historical Provider, MD  metoprolol succinate  (TOPROL-XL) 100 MG 24 hr tablet Take 100 mg by mouth daily. Take with or immediately following a meal.    Historical Provider, MD  montelukast (SINGULAIR) 10 MG tablet Take 10 mg by mouth every morning.    Historical Provider, MD  Multiple Vitamins-Minerals (MULTIVITAMIN PO) Take 1 tablet by mouth daily.    Historical Provider, MD  nateglinide (STARLIX) 60 MG tablet Take two tabs by mouth with meals twice a day 04/28/14   Dickie La, MD  nitroGLYCERIN (NITROSTAT) 0.4 MG SL tablet Place 0.4 mg under the tongue every 5 (five) minutes as needed for chest pain.    Historical Provider, MD  polyethylene glycol (MIRALAX / GLYCOLAX) packet Take 17 g by mouth daily as needed. For constipation    Historical Provider, MD  potassium chloride SA (K-DUR,KLOR-CON) 20 MEQ tablet Take 1 tablet (20 mEq total) by mouth daily. 01/26/14   Fay Records, MD  salicylic acid 17 % gel Apply topically 2 (two) times daily. Apply small amount to callous area on bottom of foot BID 02/10/14   Josalyn C Funches, MD  sertraline (ZOLOFT) 100 MG tablet Take 150 mg by mouth at bedtime.    Historical Provider, MD  sulfamethoxazole-trimethoprim (BACTRIM DS) 800-160 MG per tablet Take 1 tablet by mouth 2 (two) times daily. 03/30/14   Dickie La, MD  Vitamin D, Ergocalciferol, (DRISDOL) 50000 UNITS CAPS capsule Take 50,000 Units by mouth every 7 (seven) days. *normally takes on sundays*    Historical Provider, MD   BP 171/83  Pulse 66  Temp(Src) 97.6 F (36.4 C) (Oral)  Resp 20  SpO2 100% Physical Exam  Vitals reviewed. Constitutional: She appears well-developed and well-nourished. No distress.  HENT:  Head: Normocephalic and atraumatic.  Nose: Nose normal.  Eyes: Conjunctivae are normal. Pupils are equal, round, and reactive to light.  Neck: Normal range of motion. Neck supple. No tracheal deviation present.  No nuchal rigidity  Cardiovascular: Normal rate, regular rhythm, normal heart sounds and intact distal pulses.   No  murmur heard. Pulmonary/Chest: Effort normal and breath sounds normal. No respiratory distress. She has no rales.  Abdominal: Soft. Bowel sounds are normal. She exhibits no distension and no mass. There is no tenderness. There is no guarding.  Musculoskeletal: Normal range of motion. She exhibits no edema and no tenderness.  Neurological:  Alert and oriented x3. CN 3-12 tested and without deficit. 5/5 muscle strength in all extremities with flexion and extension.  Normal bulk and tone.  No sensory deficit to light touch.  Tandem gait normal. Neg Brudzinski and Kernigs sign.  Symmetric and equal 2+ patellar and brachioradialis DTRs.  No pronator drift.  Normal heel-to-shin and finger-to-nose.  Toes flexor bilaterally.   Skin: Skin is warm. No rash noted.  Psychiatric: She has a normal mood and affect.    ED Course  Procedures (including critical care time) Labs Review Labs Reviewed  CBC - Abnormal; Notable for the following:    Hemoglobin 11.9 (*)    All other components within normal limits  COMPREHENSIVE METABOLIC PANEL - Abnormal; Notable for the following:    Glucose, Bld 349 (*)    AST 41 (*)    ALT 39 (*)    Alkaline Phosphatase 123 (*)    Total Bilirubin <0.2 (*)    All other components within normal limits  CBG MONITORING, ED - Abnormal; Notable for the following:    Glucose-Capillary 319 (*)    All other components within normal limits  CBG MONITORING, ED - Abnormal; Notable for the following:    Glucose-Capillary 193 (*)    All other components within normal limits  URINALYSIS, ROUTINE W REFLEX MICROSCOPIC    Imaging Review Dg Chest 2 View  05/25/2014   CLINICAL DATA:  63 year old female with hyperglycemia, headache under history of hypertension diabetes and asthma  EXAM: CHEST - 2 VIEW  COMPARISON:  07/01/2013, 10/12/2012 chest CT 02/11/2011  FINDINGS: Apple coal lordotic positioning of the chest x-ray. Given this, cardiomediastinal silhouette appears unchanged in size  and contour.  No visualized pneumothorax or pleural effusion.  No confluent airspace disease.  Surgical changes of anterior cervical discectomy and fusion partially visualized.  No displaced fracture.  IMPRESSION: No radiographic evidence of acute cardiopulmonary disease.  Signed,  Dulcy Fanny. [name], DO  [facility]  [facility]   Electronically Signed   By: Corrie Mckusick O.D.   On: 05/25/2014 21:47     EKG Interpretation None      MDM   Final diagnoses:  Hyperglycemia without ketosis    Pt presents for concern of hyperglycemia.  VSS, well appearing.  FSBG 300s on arrival, decreased with rest and IVF. No signs of DKA.  Pt endorsed polyuria and polydispsia.  Neuro exam normal, HA typical of prior dehydration headaches.  CT imaging not indicated.  No infectious etiology identified on exam, history or diagnostics.  No CP. CGB improved. Pt was noted to be HTN during ED stay, has not taken home meds today. No evidence of end organ damage with normal neuro exam, clear CXR, normal kidney function/ UOP. Pt feeling much better at discharge and tolerating PO.      Tammy Sours, MD 05/26/14 [phone]

## 2014-05-25 NOTE — ED Notes (Signed)
Waiting on Cozaar from pharmacy

## 2014-05-25 NOTE — ED Notes (Signed)
Patient transported to X-ray 

## 2014-05-25 NOTE — ED Notes (Signed)
Pt still unable to give sample.

## 2014-05-25 NOTE — ED Notes (Addendum)
Bladder scanned-173mL. RN, MD notified.

## 2014-05-25 NOTE — ED Notes (Signed)
Pt assisted to bathroom again via wheelchair.  Unable to urinate.  Pharmacy notified concerning wait for medication.

## 2014-05-25 NOTE — ED Notes (Signed)
Reports blood sugars 300-400 over the past 2-3 weeks.  States prior to that it was controlled.  BP elevated- pt states she has not taken any of her meds today.

## 2014-05-25 NOTE — Discharge Instructions (Signed)

## 2014-05-25 NOTE — ED Notes (Signed)
Pt reports for the last several days her blood sugars have been running high at night in the 300-400 range. Pt reports some headache and dizziness. States hasn't taken any meds today. Pt awake, alert, oriented x4, NAD at present.

## 2014-05-26 ENCOUNTER — Ambulatory Visit: Payer: Commercial Managed Care - HMO

## 2014-05-26 NOTE — ED Provider Notes (Signed)
I saw and evaluated the patient, reviewed the resident's note and I agree with the findings and plan.   EKG Interpretation None      Patient here with hyperglycemia, general malaise. No fevers, no N/V/D. Patient without any signs of infection, sugars improved here prior to interventions. Labs ok. Patient feeling better, stable for discharge.  Evelina Bucy, MD 05/26/14 [phone]

## 2014-05-27 ENCOUNTER — Ambulatory Visit (INDEPENDENT_AMBULATORY_CARE_PROVIDER_SITE_OTHER): Payer: Commercial Managed Care - HMO

## 2014-05-27 DIAGNOSIS — J309 Allergic rhinitis, unspecified: Secondary | ICD-10-CM

## 2014-06-01 ENCOUNTER — Ambulatory Visit (INDEPENDENT_AMBULATORY_CARE_PROVIDER_SITE_OTHER): Payer: Commercial Managed Care - HMO | Admitting: Family Medicine

## 2014-06-01 ENCOUNTER — Encounter: Payer: Self-pay | Admitting: Family Medicine

## 2014-06-01 VITALS — BP 115/50 | HR 70 | Temp 98.2°F | Ht 64.0 in | Wt 179.6 lb

## 2014-06-01 DIAGNOSIS — M4322 Fusion of spine, cervical region: Secondary | ICD-10-CM

## 2014-06-01 DIAGNOSIS — E119 Type 2 diabetes mellitus without complications: Secondary | ICD-10-CM

## 2014-06-01 DIAGNOSIS — Z23 Encounter for immunization: Secondary | ICD-10-CM

## 2014-06-01 DIAGNOSIS — M539 Dorsopathy, unspecified: Secondary | ICD-10-CM

## 2014-06-01 DIAGNOSIS — G63 Polyneuropathy in diseases classified elsewhere: Secondary | ICD-10-CM

## 2014-06-02 ENCOUNTER — Telehealth: Payer: Self-pay | Admitting: *Deleted

## 2014-06-02 MED ORDER — INSULIN ASPART 100 UNIT/ML ~~LOC~~ SOLN: SUBCUTANEOUS | Status: DC

## 2014-06-02 NOTE — Telephone Encounter (Signed)
Pt in yesterday, suppose to have rx sent to pharmacy for novolog, still not there.

## 2014-06-02 NOTE — Telephone Encounter (Signed)
Pt says she was prescribed wrong novolog, was suppose to get the pens and not vial.

## 2014-06-03 LAB — FRUCTOSAMINE: Fructosamine: 325 umol/L — ABNORMAL HIGH (ref 190–270)

## 2014-06-03 NOTE — Assessment & Plan Note (Signed)
Suspect peripheral neuropathy largely related to diabetes mellitus.

## 2014-06-03 NOTE — Assessment & Plan Note (Signed)
Continued chronic neck pain. We discussed options. I'll investigate "sleep collar" And followup with her

## 2014-06-03 NOTE — Assessment & Plan Note (Addendum)
#  1. We'll add NovoLog sliding scale 5 units subcutaneous at the largest meal of the day. Sliding scale as in at 5 units if greater than 150 prior to eating. Also increased baseline Lantus to 60 units. Want to increase her to 65 that she felt that was too much. Today we'll get a fructosamine for baseline and follow her up in 4-6 weeks and repeat fructosamine  Has not had much relief with Lyrica or gabapentin. Would consider Cymbalta ever-changing her diabetic medications that like to put that off for one month. We'll see her back in 4-6 weeks and followup diabetes mellitus at which point for making no medication changes I would consider Cymbalta

## 2014-06-03 NOTE — Progress Notes (Signed)
   Subjective:    Patient ID: [name] [name], female    DOB: 07-11-1951, 63 y.o.   MRN: [id number]  HPI  #1. Diabetes mellitus. We had discontinued her metformin secondary to GI distress. Has started nagletinide but she continues to have elevated blood sugars in the 3 or 400s. She did visit the ED for elevated blood sugar above 300 with some associated dehydration, dizziness, blurred vision. Brings her blood sugar diary. Intake is somewhat variable. #2. Neck pain. Chronic. Continues to have issues particularly at night. Most of there is a sleep collar that would provide her some relief from her neck pain night. We have tried a cervical pillow and does not seem to help muc #3. Continues to have lower extremity paresthesia. #4. Pulse give her flu shot.   Review of Systems No diarrhea, no unusual weight change. Pelvic blood sugar. See history of present illness above for additional pertinent review of systems.    Objective:   Physical Exam Vital signs are reviewed GENERAL: Well-developed female no acute distress in CARDIOVASCULAR: Regular in rhythm without murmur gallop or rub LUNGS: Clear to auscultation bilaterally ABDOMEN: Soft, positive bowel sounds nontender nondistended EXTREMITY: Decreased sensation soft touch bilateral lower extremity from mid shin down encompassing both the dorsal and plantar surfaces. In\  VASCULAR: Radial and dorsalis pedis pulses 2+ bilaterally equal       Assessment & Plan:

## 2014-06-06 MED ORDER — INSULIN ASPART 100 UNIT/ML FLEXPEN: PEN_INJECTOR | SUBCUTANEOUS | Status: DC

## 2014-06-09 ENCOUNTER — Ambulatory Visit (INDEPENDENT_AMBULATORY_CARE_PROVIDER_SITE_OTHER): Payer: Commercial Managed Care - HMO

## 2014-06-09 DIAGNOSIS — J309 Allergic rhinitis, unspecified: Secondary | ICD-10-CM

## 2014-06-13 ENCOUNTER — Telehealth: Payer: Self-pay | Admitting: Family Medicine

## 2014-06-13 DIAGNOSIS — J301 Allergic rhinitis due to pollen: Secondary | ICD-10-CM

## 2014-06-13 NOTE — Telephone Encounter (Signed)
Requesting new referral for pt, has [facility], previous referral was authorized for 12 visits, pt comes for allergy injections and has used all 12 visits. Would like 26 visits put in for the referral since pt is there often.

## 2014-06-16 ENCOUNTER — Ambulatory Visit (INDEPENDENT_AMBULATORY_CARE_PROVIDER_SITE_OTHER): Payer: Commercial Managed Care - HMO

## 2014-06-16 DIAGNOSIS — J309 Allergic rhinitis, unspecified: Secondary | ICD-10-CM

## 2014-06-17 ENCOUNTER — Telehealth: Payer: Self-pay | Admitting: *Deleted

## 2014-06-17 NOTE — Telephone Encounter (Signed)
Received refill request for BD Ultra-Fine Pen NDL [id number].  Needles are not listed on medication list.  Form placed in provider for review.  Derl Barrow, RN

## 2014-06-22 ENCOUNTER — Ambulatory Visit (INDEPENDENT_AMBULATORY_CARE_PROVIDER_SITE_OTHER): Payer: Commercial Managed Care - HMO | Admitting: Family Medicine

## 2014-06-22 ENCOUNTER — Encounter: Payer: Self-pay | Admitting: Family Medicine

## 2014-06-22 ENCOUNTER — Other Ambulatory Visit: Payer: Self-pay | Admitting: *Deleted

## 2014-06-22 VITALS — BP 138/57 | HR 71 | Temp 97.7°F | Ht 64.0 in | Wt 185.0 lb

## 2014-06-22 DIAGNOSIS — G63 Polyneuropathy in diseases classified elsewhere: Secondary | ICD-10-CM

## 2014-06-22 DIAGNOSIS — I10 Essential (primary) hypertension: Secondary | ICD-10-CM

## 2014-06-22 DIAGNOSIS — E1142 Type 2 diabetes mellitus with diabetic polyneuropathy: Secondary | ICD-10-CM

## 2014-06-22 DIAGNOSIS — M4322 Fusion of spine, cervical region: Secondary | ICD-10-CM

## 2014-06-22 MED ORDER — POTASSIUM CHLORIDE CRYS ER 20 MEQ PO TBCR: 20.0000 meq | EXTENDED_RELEASE_TABLET | Freq: Every day | ORAL | Status: DC

## 2014-06-22 MED ORDER — INSULIN ASPART 100 UNIT/ML FLEXPEN: PEN_INJECTOR | SUBCUTANEOUS | Status: DC

## 2014-06-22 NOTE — Patient Instructions (Signed)
Please schedule [name] [name] for a [place] appt next week for some tweaks on her diabetes. She has seen Dr Valentina Lucks before [facility]! [name]

## 2014-06-22 NOTE — Progress Notes (Signed)
   Subjective:    Patient ID: [name] [name], female    DOB: Nov 07, 1950, 63 y.o.   MRN: [id number]  HPI Blood sugar still running high, usually in the 200s to low 300s despite using the sliding scale and she even increase her long acting insulin to 65 units as we had discussed. She then got worried about that and decreased it back to 60. No episodes of low blood sugar. She's eating normally. She does have some increased thirst. She also occasionally has dizziness particularly if she stands up too quickly. #2. Chronic neck pain. Still having difficulty getting sleep at night secondary to neck pain. We had been exploring different neck pillows and braces and none of them so far have worked. #3. Having some lower extremity swelling that is making her peripheral neuropathy worse. By the end of the day of she's been on her feet much, her legs are a little swollen and her neuropathy is much much worse.  She's taking all her current medicines regularly.   Review of Systems She's had no unusual weight change, no fever, sweats, chills. No bowel pain. Some increased thirst, but oddly no increased urination. Some lower extremity edema as per history of present illness. No change in her appetite. Peripheral neuropathy pain as per history of present illness    Objective:   Physical Exam  Vital signs reviewed. GENERAL: Well-developed, well-nourished, no acute distress. CARDIOVASCULAR: Regular rate and rhythm no murmur gallop or rub LUNGS: Clear to auscultation bilaterally, no rales or wheeze. ABDOMEN: Soft positive bowel sounds NEURO: Diffuse loss of soft touch sensation bilateral lower extremity from about the mid lower leg down encompassing all of the feet. Similar sensory loss to soft touch in her hands bilaterally particularly in the palmar surfaces today for wrist. Legally blind. MSK: Movement of extremity x 4. Lower extremities have trace non-pitting edema. VASCULAR: Dorsalis pedis pulses are 1-2+  bilaterally equal. Her capillary refill in her extremities is normal.        Assessment & Plan:

## 2014-06-23 ENCOUNTER — Ambulatory Visit (INDEPENDENT_AMBULATORY_CARE_PROVIDER_SITE_OTHER): Payer: Commercial Managed Care - HMO

## 2014-06-23 DIAGNOSIS — J309 Allergic rhinitis, unspecified: Secondary | ICD-10-CM

## 2014-06-23 MED ORDER — INSULIN GLARGINE 100 UNIT/ML ~~LOC~~ SOLN: SUBCUTANEOUS | Status: DC

## 2014-06-23 NOTE — Assessment & Plan Note (Signed)
Chronic neck pain. We had obtained a cervical pillow for her to sleep on but it was not much help. We are now going to investigate a contour foam neck pillow and see if that would help. I have also discussed this with one of her home health nurses to see if they can come up with anything additional.

## 2014-06-23 NOTE — Assessment & Plan Note (Signed)
I think the reason she's having increased pain in her legs has to do with the mild nonpitting edema. I suspect that's related to her hyperglycemia. We discussed this at some length. I think it's very important to get their control of her diabetes as our first goal.

## 2014-06-23 NOTE — Assessment & Plan Note (Signed)
We had stopped her metformin at her request because it was causing significant GI upset. Since then we've been having difficulty keeping her sugars down. I have increased her long-acting insulin to 60 or 65 units without much success. Was also added sliding scale. She is somewhat hesitant to really increase these doses as I suspect she needs. She really want to try some other type of oral medicine so I tried nategleinde (against my better judgement) and it was not helpful at all. At last office visit we did a fructosamine to see if we did improve by adding her sliding scale and by increasing her long-acting insulin. We'll redraw that today. I think she would benefit from seeing Dr. Valentina Lucks and pharmacy clinic again and I have set that up.

## 2014-06-24 LAB — FRUCTOSAMINE: Fructosamine: 352 umol/L — ABNORMAL HIGH (ref 190–270)

## 2014-06-29 ENCOUNTER — Ambulatory Visit: Payer: Commercial Managed Care - HMO | Admitting: Family Medicine

## 2014-06-30 ENCOUNTER — Encounter: Payer: Self-pay | Admitting: Pharmacist

## 2014-06-30 ENCOUNTER — Ambulatory Visit (INDEPENDENT_AMBULATORY_CARE_PROVIDER_SITE_OTHER): Payer: Commercial Managed Care - HMO | Admitting: Pharmacist

## 2014-06-30 ENCOUNTER — Ambulatory Visit (INDEPENDENT_AMBULATORY_CARE_PROVIDER_SITE_OTHER): Payer: Commercial Managed Care - HMO

## 2014-06-30 VITALS — BP 155/72 | HR 72 | Ht 61.5 in | Wt 189.0 lb

## 2014-06-30 DIAGNOSIS — J309 Allergic rhinitis, unspecified: Secondary | ICD-10-CM

## 2014-06-30 DIAGNOSIS — I10 Essential (primary) hypertension: Secondary | ICD-10-CM

## 2014-06-30 DIAGNOSIS — Z91030 Bee allergy status: Secondary | ICD-10-CM | POA: Insufficient documentation

## 2014-06-30 DIAGNOSIS — Z91038 Other insect allergy status: Secondary | ICD-10-CM

## 2014-06-30 DIAGNOSIS — G63 Polyneuropathy in diseases classified elsewhere: Secondary | ICD-10-CM

## 2014-06-30 DIAGNOSIS — E559 Vitamin D deficiency, unspecified: Secondary | ICD-10-CM | POA: Insufficient documentation

## 2014-06-30 DIAGNOSIS — E0842 Diabetes mellitus due to underlying condition with diabetic polyneuropathy: Secondary | ICD-10-CM

## 2014-06-30 MED ORDER — EPINEPHRINE 0.3 MG/0.3ML IJ SOAJ: 0.3000 mg | Freq: Once | INTRAMUSCULAR | Status: DC | PRN

## 2014-06-30 MED ORDER — ATORVASTATIN CALCIUM 40 MG PO TABS: 40.0000 mg | ORAL_TABLET | Freq: Every day | ORAL | Status: DC

## 2014-06-30 MED ORDER — SPIRONOLACTONE 25 MG PO TABS: 25.0000 mg | ORAL_TABLET | Freq: Every day | ORAL | Status: DC

## 2014-06-30 NOTE — Progress Notes (Signed)
Patient ID: [name] [name], female   DOB: 09/29/1950, 63 y.o.   MRN: [id number] Reviewed: Agree with Dr. Koval's documentation and management. 

## 2014-06-30 NOTE — Assessment & Plan Note (Addendum)
Vitamin D Deficiency: Patient with history of vitamin D deficiency. Has been taking 50,000 units weekly for >4 weeks. No Vit D levels since 2013. Continue Vitamin D for now. Lab drawn before leaving today. Will follow up results and make changes to therapy accordingly.  25 hydroxy Vitamin D level increased to 50 - Plan to decrease to once monthly at next visit in 2-3 weeks.

## 2014-06-30 NOTE — Assessment & Plan Note (Signed)
Longstanding history of hypertension. Currently taking metoprolol, losartan and lasix. Control is suboptimal warranting change in therapy. Added spironolactone 25mg  daily. Patient is to stop taking K-Dur. BMET obtained today before leaving.

## 2014-06-30 NOTE — Patient Instructions (Addendum)
It was great seeing you today. Below are the changes we have made today. Begin taking Spironolactone 25mg  daily for your blood pressure.  Stop taking your potassium chloride.  Decrease your Lantus dose to 50 units daily. Increase Novolog to 20 - 25 with both meals. If you continue to have any readings <100 please call us. Increase your atorvastatin (Lipitor) to 40mg  daily. Increase your gabapentin to 600mg  twice a day. Refill your EpiPen so it is now in date. New prescriptions have been sent to your pharmacy.  Call if you have any questions.

## 2014-06-30 NOTE — Assessment & Plan Note (Signed)
Patient currently taking gabapentin 600mg  at bedtime. States that it does help, but sometimes it wares off. Increased dose to 600mg  twice daily. Counseled patient on possible side effects including fatigue. Anticipate titration to TID at next visit.

## 2014-06-30 NOTE — Assessment & Plan Note (Signed)
Patient currently taking atorvastatin 20mg . Patient with DM and elevated LDL warranting increase in dosage to reduce cardiovascular risk. Increase to atorvastatin 40mg  daily. Patient counseled on possible side effects including muscle aches.

## 2014-06-30 NOTE — Progress Notes (Signed)
S:    Patient arrives in good spirits, using a cane. Presents for diabetes management.   Patient longstanding history of diabetes. Patient explains that she uses 5 units of Novolog when her sugars at <150 and 10 units when >150. Also states that she used 65 units of Lantus when her sugars are >290 and 60 units when <290. Patient experiences peripheral neuropathy that she takes gabapentin for and states that it does help, but sometimes it wares off    O:  . Lab Results  Component Value Date   HGBA1C 9.2 04/27/2014     home fasting CBG readings of 190 - 250s 2 hour post-prandial/random CBG readings of 300s  A/P: DM: Longstanding history of diabetes. Currently on Lantus 60-65 units daily and Novolog 5-10 units with the largest meal. Denies hypoglycemic events and is able to verbalize appropriate hypoglycemia management plan.  Reports adherence with medication but sometimes forgets to take her Lantus due to it being scheduled at night.  Control is suboptimal due to noncompliance and need for dose titration.  Decreased dose of basal insulin Lantus (insulin glargine) to 50 units daily. Increased dose of rapid insulin Novolog (insulin aspart) to 20 units with early meal and 25 units with late meal daily. Next A1C anticipated November-December 2015.    HTN: Longstanding history of hypertension. Currently taking metoprolol, losartan and lasix. Control is suboptimal warranting change in therapy. Added spironolactone 25mg  daily. Patient is to stop taking K-Dur. BMET obtained today before leaving.  Peripheral Neuropathy: Patient currently taking gabapentin 600mg  at bedtime. States that it does help, but sometimes it wares off. Increased dose to 600mg  twice daily. Counseled patient on possible side effects including fatigue. Anticipate titration to TID at next visit.   HLD: Patient currently taking atorvastatin 20mg . Patient with DM and elevated LDL warranting increase in dosage to reduce cardiovascular  risk. Increase to atorvastatin 40mg  daily. Patient counseled on possible side effects including muscle aches.   Vitamin D Deficiency: Patient with history of vitamin D deficiency. Has been taking 50,000 units weekly for >4 weeks. No Vit D levels since 2013. Continue Vitamin D for now. Lab drawn before leaving today. Will follow up results and make changes to therapy accordingly.  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 While performing medication reconciliation found EpiPen to be expired. New order sent to pharmacy for patient pick up.   Written patient instructions provided. Patient verbalized understanding. Follow up in Pharmacist Clinic Visit in 3 weeks.   Total time in face to face counseling 45 minutes.  Patient seen with Katherine Roan,  PharmD Resident.

## 2014-06-30 NOTE — Assessment & Plan Note (Signed)
Longstanding history of diabetes. Currently on Lantus 60-65 units daily and Novolog 5-10 units with the largest meal. Denies hypoglycemic events and is able to verbalize appropriate hypoglycemia management plan.  Reports adherence with medication but sometimes forgets to take her Lantus due to it being scheduled at night.  Control is suboptimal due to noncompliance and need for dose titration.  Decreased dose of basal insulin Lantus (insulin glargine) to 50 units daily. Increased dose of rapid insulin Novolog (insulin aspart) to 20 units with early meal and 25 units with late meal daily.

## 2014-07-01 LAB — BASIC METABOLIC PANEL
BUN: 9 mg/dL (ref 6–23)
CO2: 30 mEq/L (ref 19–32)
Calcium: 9.1 mg/dL (ref 8.4–10.5)
Chloride: 101 mEq/L (ref 96–112)
Creat: 0.63 mg/dL (ref 0.50–1.10)
Glucose, Bld: 240 mg/dL — ABNORMAL HIGH (ref 70–99)
Potassium: 4.1 mEq/L (ref 3.5–5.3)
Sodium: 140 mEq/L (ref 135–145)

## 2014-07-01 LAB — VITAMIN D 25 HYDROXY (VIT D DEFICIENCY, FRACTURES): Vit D, 25-Hydroxy: 50 ng/mL (ref 30–89)

## 2014-07-06 ENCOUNTER — Encounter: Payer: Self-pay | Admitting: Internal Medicine

## 2014-07-06 ENCOUNTER — Ambulatory Visit (INDEPENDENT_AMBULATORY_CARE_PROVIDER_SITE_OTHER): Payer: Commercial Managed Care - HMO | Admitting: Internal Medicine

## 2014-07-06 ENCOUNTER — Ambulatory Visit (INDEPENDENT_AMBULATORY_CARE_PROVIDER_SITE_OTHER): Payer: Commercial Managed Care - HMO

## 2014-07-06 VITALS — BP 132/70 | HR 75 | Ht 61.0 in

## 2014-07-06 DIAGNOSIS — J449 Chronic obstructive pulmonary disease, unspecified: Secondary | ICD-10-CM

## 2014-07-06 DIAGNOSIS — J309 Allergic rhinitis, unspecified: Secondary | ICD-10-CM

## 2014-07-06 DIAGNOSIS — E0843 Diabetes mellitus due to underlying condition with diabetic autonomic (poly)neuropathy: Secondary | ICD-10-CM

## 2014-07-06 DIAGNOSIS — J301 Allergic rhinitis due to pollen: Secondary | ICD-10-CM

## 2014-07-06 NOTE — Progress Notes (Signed)
Patient ID: [name] [name], female    DOB: 25-Sep-1950, 63 y.o.   MRN: [id number]  HPI 01/24/11 28 yoF former smoker followed for asthma, sleep apnea, complicated by hx depression, vocal cord paralysis, GERD. Last here August 17, 2010- note reviewed  Doesn't wear CPAP- can't tolerate it on her face. Didn't like nasal pillows or her current nasal mask. Has own teeth. Aware she tosses and turns at night. CPAP 12 Advanced.  Denies recent asthma attacks. Uses rescue inhaler 1-2x/ month. Continues allergy shots and says she breathes better and coughs less since she has been on them.  No longer chokes or coughs much with eating as she used to. No recent colds.   07/31/11- 82 yoF former smoker followed for asthma, sleep apnea, complicated by hx depression, vocal cord paralysis, GERD. Has had flu vaccine. Continues CPAP at 12 CWP,  used all night every night. She had moved, and can't find her nebulizer machine. We discussed whether she would use it this winter. She continues allergy vaccine and believes that has helped.  03/01/13- 18 yoF former smoker followed for asthma/ COPD, sleep apnea, complicated by hx depression, vocal cord paralysis, GERD. FOLLOWS FOR: has good and bad days since no longer on vaccine; Wears CPAP 12/ Advanced every night for about 8-9 hours; pressure working well for patient. Neck pain s/p c-spine fusion, ,imits comfortable sleep position with CPAP. Occ wheeze and persistent nose and chest congestion despite meds. Would like to restart allergy vaccine- discussed. Thinks she is mobile enough with [facility] transport to get here.  PFT 2011 documented moderate obstruction w/ little response to bronchodilator.   07/01/13- 26 yoF former smoker followed for asthma/ COPD, sleep apnea, complicated by hx depression, vocal cord paralysis, GERD. FOLLOWS FOR: wears CPAP 12/ Advanced every night for abour 8 or more hours; pressure doing well for patient. Still on allergy vaccine; noticed she had  some bumps on arms after her injections(varies). Has noticed that about 5-10 minutes after [place] treatment she has jitters and nervous feelings. Holding vaccine dose at 1:5000 [place]. She notices minor bumps but may be unrelated.  01/03/14- 62 yoF former smoker followed for asthma/ COPD, sleep apnea, complicated by hx depression, vocal cord paralysis, GERD. FOLLOWS FOR: continues to wear CPAP12/[employer] every night for about 5-9 hours; pressure is doing well; DME is [facility]. Pt is still on vaccine and doing well. CPAP doing well.  Allergy vaccine 1:50 GH, holding. Jaw pain w/ sustained walking in January. Sees Dr P.Ross/ Cards. Cath neg.  07/06/14- 60 yoF former smoker followed for asthma/ COPD, sleep apnea, complicated by hx depression, vocal cord paralysis, GERD FOLLOWS FOR: Pt taking allergy vaccine and is going well. Pt c/o dizziness that presented today. Pt had unsteady gate when walking to exam room and needed wheelchair. Pt states she is wearing her CPAP12/[facility] nightly for 5-8 hours. Pt denies issues with mask, pressure or machine.  Allergy vaccine 1:10 GH, holding. Has been feeling dizzy/unsteady. This is not new and has been intermittent over at least the last couple of years. She thinks she is doing well with allergy vaccine and seeks no changes Describes good compliance and control with CPAP CXR 05/25/14 IMPRESSION: No radiographic evidence of acute cardiopulmonary disease. Signed, Dulcy Fanny. [name], DO [facility] Electronically Signed  By: Corrie Mckusick O.D.  On: 05/25/2014 21:47  Review of Systems-see HPI Constitutional:   No-   weight loss, night sweats, fevers, chills, fatigue, lassitude. HEENT:  No-  headaches, difficulty swallowing, tooth/dental problems, sore throat,+ impaired vision      No-  sneezing, itching, ear ache, +nasal congestion, post nasal drip,  CV:  No-   chest pain, orthopnea, PND, swelling in lower  extremities, anasarca, dizziness, palpitations Resp: No-   shortness of breath with exertion or at rest.              No-   productive cough,  + non-productive cough,  No- coughing up of blood.              No-   change in color of mucus.  +Occasional- wheezing.   Skin: - GI:  No-   heartburn, indigestion, abdominal pain, nausea, vomiting,  GU:. MS:  Carpal tunnel pains.  . Neuro-    + occasional dizziness Psych:  No- change in mood or affect. No depression or anxiety.  No memory loss.     Objective:   Physical Exam General- Alert, Oriented, Affect-appropriate, Distress- none acute. Overweight  Skin- + diffuse tiny subcutaneous nodules on the arms, nontender, mobile. I think these are in the subcutaneous fat Lymphadenopathy- none Head- atraumatic            Eyes- +Very thick corrective lenses, PERRLA, conjunctivae clear secretions, +strabismus            Ears- Hearing, canals-normal            Nose- Clear, no-Septal dev, mucus, polyps, erosion, perforation             Throat- Mallampati II , mucosa clear , drainage- none, tonsils- atrophic.  Neck- flexible , trachea midline, no stridor , thyroid nl, carotid no bruit,+ CEA scar R neck Chest - symmetrical excursion , unlabored           Heart/CV- RRR , no murmur , no gallop  , no rub, nl s1 s2                           - JVD- none , edema- none, stasis changes- none, varices- none           Lung- clear to P&A, wheeze- none, cough- none , dullness-none, rub- none           Chest wall-  Abd-  Br/ Gen/ Rectal- Not done, not indicated Extrem- cyanosis- none, clubbing, none, atrophy- none, +wheelchair Neuro- + nystagmus

## 2014-07-06 NOTE — Patient Instructions (Addendum)
We can continue CPAP 12/ [employer]. You can call them about servicing your machine  We can continue allergy vaccine 1:10 GH  Please call us as needed

## 2014-07-08 ENCOUNTER — Telehealth: Payer: Self-pay | Admitting: Family Medicine

## 2014-07-08 DIAGNOSIS — E0843 Diabetes mellitus due to underlying condition with diabetic autonomic (poly)neuropathy: Secondary | ICD-10-CM

## 2014-07-08 NOTE — Telephone Encounter (Signed)
Pt called and needs a refill called in to her online pharmacy [phone]. She needs a new accu-chek meter and the lancets and strips to go along with this. jw

## 2014-07-09 ENCOUNTER — Encounter: Payer: Self-pay | Admitting: Internal Medicine

## 2014-07-09 NOTE — Assessment & Plan Note (Signed)
controlled 

## 2014-07-09 NOTE — Assessment & Plan Note (Signed)
She can continue allergy vaccine 1:10

## 2014-07-09 NOTE — Assessment & Plan Note (Signed)
Complaint of occasional unsteadiness/dizziness may be related to her neuropathy as well as her nystatin as

## 2014-07-12 NOTE — Telephone Encounter (Signed)
Dear [name] Team Madaline Brilliant is it her [facility] or her [facility] pharmacy--I have both listed THANKS! [name]

## 2014-07-13 NOTE — Telephone Encounter (Signed)
It is [employer] and I have changed the pharmacy listed below to [facility]

## 2014-07-14 ENCOUNTER — Ambulatory Visit (INDEPENDENT_AMBULATORY_CARE_PROVIDER_SITE_OTHER): Payer: Commercial Managed Care - HMO

## 2014-07-14 DIAGNOSIS — J309 Allergic rhinitis, unspecified: Secondary | ICD-10-CM

## 2014-07-15 ENCOUNTER — Encounter: Payer: Self-pay | Admitting: Internal Medicine

## 2014-07-18 ENCOUNTER — Ambulatory Visit (INDEPENDENT_AMBULATORY_CARE_PROVIDER_SITE_OTHER): Payer: Commercial Managed Care - HMO | Admitting: Pharmacist

## 2014-07-18 ENCOUNTER — Encounter: Payer: Self-pay | Admitting: Pharmacist

## 2014-07-18 VITALS — BP 123/72 | HR 71 | Ht 61.0 in | Wt 187.5 lb

## 2014-07-18 DIAGNOSIS — E0840 Diabetes mellitus due to underlying condition with diabetic neuropathy, unspecified: Secondary | ICD-10-CM

## 2014-07-18 DIAGNOSIS — E559 Vitamin D deficiency, unspecified: Secondary | ICD-10-CM

## 2014-07-18 DIAGNOSIS — I10 Essential (primary) hypertension: Secondary | ICD-10-CM

## 2014-07-18 NOTE — Patient Instructions (Addendum)
Nice to see you today!  Increase your Lantus to 55 units each morning.   Increase your Novolog to 25 units twice daily before your meals.   Change your  Metoprolol, Losartan and Spironolactone to BEFORE supper dosing.  Continue to take your Lasix (take both tablets at the same time).   Take your Vitamin D once monthly on the 3rd of each month.   Next visit with Dr. Nori Riis in December and then back to Pharmacy clinic in January.

## 2014-07-18 NOTE — Assessment & Plan Note (Signed)
Vitamin D level was adequate (level 50 at recent check)   Changed Vitamin D to once monthly 50,000 units on the 3rd of each month as this is the patient's pay day which may make it easier for her to remember to take this monthly.

## 2014-07-18 NOTE — Assessment & Plan Note (Signed)
Hypertension improved in office today following initiation of spironolactone at last visit.   Labs initially were appropriate.   Repeat BMET today.   Asked patient to switch metoprolol, losartan and spironolactone to QPM dosing AND asked her to take both lasix tablets at the same time (40mg  total) - patient was taking 20mg  BID.

## 2014-07-18 NOTE — Progress Notes (Signed)
S:    Patient arrives in good spirits walking with assistance of rolling walker.   Presents for diabetes follow up after recent visit where both Diabetes and Hypertension medications were adjusted.  Patient reports blood glucose readings and blood pressure readings appear to be similar with home monitoring since most recent adjustments.  Patient has taken lantus 50 units QAM and Novolog 20 units in the AM and 25 units in the later meal time since last visit.    No adverse effects of medications were reported.  She does state at times she feels "weak" or light-headed.   Patient continues to report constipation with variable control using Miralax.   (deferred to next PCP visit).    O:  Lab Results  Component Value Date   HGBA1C 9.2 04/27/2014     home fasting CBG readings of 120-190 2 hour post-prandial/random CBG readings of 150-300  Trace - 1+ edema bilaterally   A/P: Diabetes with longstanding history currently minimally improved in blood sugar control since last visit despite increase doses of insulin.   Denies hypoglycemic events and is able to verbalize appropriate hypoglycemia management plan. Denies any blood glucose readings < 100.   Reports adherence with medication.  Control is suboptimal due to sedentary lifestyle. Increased dose of basal insulin Lantus (insulin glargine) from 50 to 55 units QAM.  Increased dose of rapid insulin Novolog (insulin aspart) from 20 units in the AM and 25 units in the PM meal to 25 units prior to each meal.   Patient was happier with the consistent dosing.  Next A1C anticipated in 1-2 months depending on changes seen with recent changes.   Written patient instructions provided.  Follow up in Pharmacist Clinic Visit in January following next visit in December with Dr. Nori Riis.   Total time in face to face counseling 35 minutes.   Hypertension improved in office today following initiation of spironolactone at last visit.   Labs initially were appropriate.    Repeat BMET today.   Asked patient to switch metoprolol, losartan and spironolactone to QPM dosing AND asked her to take both lasix tablets at the same time (40mg  total) - patient was taking 20mg  BID.      Vitamin D level was adequate (level 50 at recent check)   Changed Vitamin D to once monthly 50,000 units on the 3rd of each month as this is the patient's pay day which may make it easier for her to remember to take this monthly.

## 2014-07-18 NOTE — Assessment & Plan Note (Signed)
Diabetes with longstanding history currently minimally improved in blood sugar control since last visit despite increase doses of insulin.   Denies hypoglycemic events and is able to verbalize appropriate hypoglycemia management plan. Denies any blood glucose readings < 100.   Reports adherence with medication.  Control is suboptimal due to sedentary lifestyle. Increased dose of basal insulin Lantus (insulin glargine) from 50 to 55 units QAM.  Increased dose of rapid insulin Novolog (insulin aspart) from 20 units in the AM and 25 units in the PM meal to 25 units prior to each meal.   Patient was happier with the consistent dosing.  Next A1C anticipated in 1-2 months depending on changes seen with recent changes.   Written patient instructions provided.  Follow up in Pharmacist Clinic Visit in January following next visit in December with Dr. Nori Riis.   Total time in face to face counseling 35 minutes.

## 2014-07-19 ENCOUNTER — Encounter: Payer: Self-pay | Admitting: Family Medicine

## 2014-07-19 LAB — BASIC METABOLIC PANEL
BUN: 11 mg/dL (ref 6–23)
CO2: 30 mEq/L (ref 19–32)
Calcium: 9 mg/dL (ref 8.4–10.5)
Chloride: 99 mEq/L (ref 96–112)
Creat: 0.72 mg/dL (ref 0.50–1.10)
Glucose, Bld: 350 mg/dL — ABNORMAL HIGH (ref 70–99)
Potassium: 4 mEq/L (ref 3.5–5.3)
Sodium: 139 mEq/L (ref 135–145)

## 2014-07-20 NOTE — Progress Notes (Signed)
Patient ID: [name] [name], female   DOB: 09/30/1950, 63 y.o.   MRN: [id number] Reviewed.  Agree with documentation and management of Dr. Valentina Lucks

## 2014-07-21 ENCOUNTER — Ambulatory Visit (INDEPENDENT_AMBULATORY_CARE_PROVIDER_SITE_OTHER): Payer: Commercial Managed Care - HMO

## 2014-07-21 DIAGNOSIS — J309 Allergic rhinitis, unspecified: Secondary | ICD-10-CM

## 2014-08-01 ENCOUNTER — Telehealth: Payer: Self-pay | Admitting: Family Medicine

## 2014-08-01 NOTE — Telephone Encounter (Signed)
Pt called because she is still waiting on her accu-check meter and lancets. She said that [facility] still has not received this. jw

## 2014-08-01 NOTE — Telephone Encounter (Signed)
Dear [name] [name] Team I have sent it I will send it again plz let her know THANKS! [name] [name]

## 2014-08-05 ENCOUNTER — Ambulatory Visit (INDEPENDENT_AMBULATORY_CARE_PROVIDER_SITE_OTHER): Payer: Commercial Managed Care - HMO

## 2014-08-05 DIAGNOSIS — J309 Allergic rhinitis, unspecified: Secondary | ICD-10-CM

## 2014-08-08 ENCOUNTER — Other Ambulatory Visit: Payer: Self-pay | Admitting: *Deleted

## 2014-08-08 DIAGNOSIS — I10 Essential (primary) hypertension: Secondary | ICD-10-CM

## 2014-08-08 DIAGNOSIS — E0840 Diabetes mellitus due to underlying condition with diabetic neuropathy, unspecified: Secondary | ICD-10-CM

## 2014-08-09 ENCOUNTER — Other Ambulatory Visit: Payer: Self-pay | Admitting: *Deleted

## 2014-08-09 DIAGNOSIS — I10 Essential (primary) hypertension: Secondary | ICD-10-CM

## 2014-08-09 MED ORDER — LANCETS MISC: Status: DC

## 2014-08-09 MED ORDER — SPIRONOLACTONE 25 MG PO TABS: 25.0000 mg | ORAL_TABLET | Freq: Every day | ORAL | Status: DC

## 2014-08-09 MED ORDER — ATORVASTATIN CALCIUM 40 MG PO TABS: 40.0000 mg | ORAL_TABLET | Freq: Every day | ORAL | Status: DC

## 2014-08-09 MED ORDER — EPINEPHRINE 0.3 MG/0.3ML IJ SOAJ: 0.3000 mg | Freq: Once | INTRAMUSCULAR | Status: DC | PRN

## 2014-08-09 MED ORDER — INSULIN ASPART 100 UNIT/ML FLEXPEN: PEN_INJECTOR | SUBCUTANEOUS | Status: DC

## 2014-08-09 NOTE — Telephone Encounter (Signed)
Potassium stable on spironolactone and losaarton at 4.0 [name] [name]  I sent DME again for glucometer accu check nano smart view and lancets accucheck fast clix as well [name] [name]

## 2014-08-09 NOTE — Telephone Encounter (Signed)
Also need Rx for Accu-Chek Nano Smartview Meter and Accu-Chk [employer] sent to [employer] [employer]  Not listed on Med list.  Derl Barrow, RN

## 2014-08-10 ENCOUNTER — Ambulatory Visit (INDEPENDENT_AMBULATORY_CARE_PROVIDER_SITE_OTHER): Payer: Commercial Managed Care - HMO | Admitting: Family Medicine

## 2014-08-10 ENCOUNTER — Encounter: Payer: Self-pay | Admitting: Family Medicine

## 2014-08-10 ENCOUNTER — Other Ambulatory Visit: Payer: Self-pay | Admitting: Family Medicine

## 2014-08-10 VITALS — BP 126/56 | HR 70 | Temp 98.3°F | Ht 61.0 in | Wt 188.3 lb

## 2014-08-10 DIAGNOSIS — E0840 Diabetes mellitus due to underlying condition with diabetic neuropathy, unspecified: Secondary | ICD-10-CM

## 2014-08-10 DIAGNOSIS — Z658 Other specified problems related to psychosocial circumstances: Secondary | ICD-10-CM

## 2014-08-10 NOTE — Progress Notes (Signed)
   Subjective:    Patient ID: [name] [name], female    DOB: 1951-08-14, 63 y.o.   MRN: [id number]  HPI Follow diabetes mellitus. She likes the new medication regimen much better as it's more consistent and easier for her to use. She's had no episodes of low blood sugars. She brings her readings in for me to review. She's had 2 blood sugars above 300. No increase in urinary frequency. #2. Follow-up change in blood pressure medicine and addition of Spiriva lactone. No problems. Taking them regularly. #3. Some stressors regarding her son she wants to discuss.   Review of Systems Denies fever, sweats, chills, unusual weight change. Has had no falls. No increase in urination or thirst. No chest pain. Continues to have very mild lower extremity swelling but it is unchanged from baseline. Still has occasional cough but unchanged from baseline.    Objective:   Physical Exam   Vital signs reviewed. GENERAL: Well-developed, well-nourished, no acute distress. HEENT: Legally blind. CARDIOVASCULAR: Regular rate and rhythm no murmur gallop or rub LUNGS: Clear to auscultation bilaterally, no rales or wheeze. ABDOMEN: Soft positive bowel sounds MSK: Movement of extremity x 4.       Assessment & Plan:  Psychosocial stressors involving her son. Wiese discussed at some length spinning greater than 50% of her 40  office visit in counseling education regarding these issues. She does not want me to mention this to anyone.

## 2014-08-10 NOTE — Telephone Encounter (Signed)
Does not need the potassium as she is on spironolactone and ARB now I have taken it off her med list  Dear [name] [name] Team Please call her pharmacy and make sure they discontinue her potassium supplement rx and then let her know she does not need it (I don't think) now that she is on aldactone (spironolactone) THANKS! [name]

## 2014-08-10 NOTE — Assessment & Plan Note (Signed)
We tried to check her A1c today but had a problem with some machinery in the laboratory. She'll follow-up in a month or so we'll recheck it then. I still think died is the largest obstacle to her improving her blood sugars significantly although she's not very active either. She seems to be tolerating her medicines well right now.

## 2014-08-11 ENCOUNTER — Encounter (HOSPITAL_COMMUNITY): Payer: Self-pay | Admitting: Interventional Cardiology

## 2014-08-11 ENCOUNTER — Ambulatory Visit (INDEPENDENT_AMBULATORY_CARE_PROVIDER_SITE_OTHER): Payer: Commercial Managed Care - HMO

## 2014-08-11 DIAGNOSIS — J309 Allergic rhinitis, unspecified: Secondary | ICD-10-CM

## 2014-08-18 ENCOUNTER — Ambulatory Visit: Payer: Commercial Managed Care - HMO

## 2014-08-24 ENCOUNTER — Ambulatory Visit (INDEPENDENT_AMBULATORY_CARE_PROVIDER_SITE_OTHER): Payer: Commercial Managed Care - HMO

## 2014-08-24 ENCOUNTER — Telehealth: Payer: Self-pay | Admitting: *Deleted

## 2014-08-24 DIAGNOSIS — J309 Allergic rhinitis, unspecified: Secondary | ICD-10-CM

## 2014-08-24 NOTE — Telephone Encounter (Signed)
Pt calls with reports of her glucose as requested by Dr. Nori Riis.  290 @ 7:33am 421 @ 951am 394 @ 11:49am  Pt has not taken any medications today because "I have not eaten much and I m getting ready to go out". [name] [name], [name]

## 2014-08-29 ENCOUNTER — Other Ambulatory Visit: Payer: Self-pay | Admitting: Family Medicine

## 2014-08-29 DIAGNOSIS — E0840 Diabetes mellitus due to underlying condition with diabetic neuropathy, unspecified: Secondary | ICD-10-CM

## 2014-08-29 DIAGNOSIS — I10 Essential (primary) hypertension: Secondary | ICD-10-CM

## 2014-08-29 MED ORDER — SPIRONOLACTONE 25 MG PO TABS: 25.0000 mg | ORAL_TABLET | Freq: Every day | ORAL | Status: DC

## 2014-08-29 MED ORDER — INSULIN ASPART 100 UNIT/ML FLEXPEN
PEN_INJECTOR | SUBCUTANEOUS | Status: DC
Start: 2014-08-29 — End: 2014-09-08

## 2014-08-29 MED ORDER — ATORVASTATIN CALCIUM 40 MG PO TABS: 40.0000 mg | ORAL_TABLET | Freq: Every day | ORAL | Status: DC

## 2014-08-29 NOTE — Telephone Encounter (Signed)
ONCE AGAIN I have faxed rx and paperwork for her glucometer, lancets, atorvastatin. Spironolactone and flex pen as well as epi pen to [facility]

## 2014-08-29 NOTE — Telephone Encounter (Signed)
ok 

## 2014-08-31 ENCOUNTER — Ambulatory Visit (INDEPENDENT_AMBULATORY_CARE_PROVIDER_SITE_OTHER): Payer: Commercial Managed Care - HMO

## 2014-08-31 DIAGNOSIS — J309 Allergic rhinitis, unspecified: Secondary | ICD-10-CM

## 2014-08-31 LAB — HM DIABETES EYE EXAM

## 2014-09-07 ENCOUNTER — Encounter: Payer: Self-pay | Admitting: Internal Medicine

## 2014-09-08 ENCOUNTER — Ambulatory Visit (INDEPENDENT_AMBULATORY_CARE_PROVIDER_SITE_OTHER): Payer: Commercial Managed Care - HMO

## 2014-09-08 ENCOUNTER — Ambulatory Visit (INDEPENDENT_AMBULATORY_CARE_PROVIDER_SITE_OTHER): Payer: Commercial Managed Care - HMO | Admitting: Pharmacist

## 2014-09-08 ENCOUNTER — Encounter: Payer: Self-pay | Admitting: Pharmacist

## 2014-09-08 VITALS — BP 112/66 | HR 69 | Ht 61.5 in | Wt 189.0 lb

## 2014-09-08 DIAGNOSIS — E0840 Diabetes mellitus due to underlying condition with diabetic neuropathy, unspecified: Secondary | ICD-10-CM | POA: Diagnosis not present

## 2014-09-08 DIAGNOSIS — J309 Allergic rhinitis, unspecified: Secondary | ICD-10-CM | POA: Diagnosis not present

## 2014-09-08 DIAGNOSIS — G63 Polyneuropathy in diseases classified elsewhere: Secondary | ICD-10-CM

## 2014-09-08 NOTE — Assessment & Plan Note (Signed)
Diabetes diagnosed many years ago currently poorly controlled with blood glucose levels of 200 to above 400mg /dL.  Denies hypoglycemic events  Reports adherence with medication. Control is suboptimal due to not enough insulin to control her glucose levels, dietary indiscretion and sedentary lifestyle  Increased dose of basal insulin Lantus (insulin glargine) from 55 to 65 units. Increased dose of rapid insulin Novolog (insulin aspart) 25 to 30 units with her two meals.   Next A1C anticipated in two months.  Written patient instructions provided.  Follow up in Pharmacist Clinic Visit at the end of january.   Total time in face to face counseling 20 minutes.  Patient seen with Randell Patient, PharmD Candidate and Milus Glazier,  PharmD Resident.  . Neuropathic pain not well controlled on the current dose of gabapentin.  Increase dose from 600 mg bid to 600mg  qam, 300mg  mid day and 600mg  qpm.  Reassess neuropathic pain control next visit.

## 2014-09-08 NOTE — Progress Notes (Signed)
Patient ID: [name] [name], female   DOB: 04/19/1951, 64 y.o.   MRN: [id number] Reviewed: Agree with Dr. Graylin Shiver documentation and management.

## 2014-09-08 NOTE — Patient Instructions (Addendum)
It was nice to see you today.  Please increase your lantus dose to 65 units a day  Please increase novolog dose to 30 units twice a day with your two meals.   Increase gabapentin dose to 600mg  in the morning, 300mg  mid day and 600mg  in the evening.   Will see you back in the pharmacy clinic later this month (january).

## 2014-09-08 NOTE — Assessment & Plan Note (Signed)
Neuropathic pain not well controlled on the current dose of gabapentin.  Increase dose from 600 mg bid to 600mg  qam, 300mg  mid day and 600mg  qpm.  Reassess neuropathic pain control next visit.

## 2014-09-08 NOTE — Progress Notes (Signed)
S:    Patient arrives in pleasant and gregarious spirits and walking with the assistance of a cane. Presents for diabetes type 2 with blood glucose readings mostly above 200 mg/dL.   Patient reports having long standing history of Diabetes.. Patient reports a diet low in vegetables and high in carbohydrates and fats as well as lack of regular physical activity.    O:  . Lab Results  Component Value Date   HGBA1C 9.2 04/27/2014     home fasting CBG readings of above 300 mg/dL 2 hour post-prandial/random CBG readings of 200 to > 400 mg/dL.  A/P: Diabetes diagnosed many years ago currently poorly controlled with blood glucose levels of 200 to above 400mg /dL.  Denies hypoglycemic events  Reports adherence with medication. Control is suboptimal due to not enough insulin to control her glucose levels, dietary indiscretion and sedentary lifestyle  Increased dose of basal insulin Lantus (insulin glargine) from 55 to 65 units. Increased dose of rapid insulin Novolog (insulin aspart) 25 to 30 units with her two meals.   Next A1C anticipated in two months.  Written patient instructions provided.  Follow up in Pharmacist Clinic Visit at the end of january.   Total time in face to face counseling 20 minutes.  Patient seen with Randell Patient, PharmD Candidate and Milus Glazier,  PharmD Resident.  . Neuropathic pain not well controlled on the current dose of gabapentin.  Increase dose from 600 mg bid to 600mg  qam, 300mg  mid day and 600mg  qpm.  Reassess neuropathic pain control next visit.

## 2014-09-14 ENCOUNTER — Encounter: Payer: Self-pay | Admitting: Family Medicine

## 2014-09-15 ENCOUNTER — Ambulatory Visit (INDEPENDENT_AMBULATORY_CARE_PROVIDER_SITE_OTHER): Payer: Commercial Managed Care - HMO

## 2014-09-15 ENCOUNTER — Encounter (HOSPITAL_COMMUNITY): Payer: Self-pay | Admitting: Neurological Surgery

## 2014-09-15 DIAGNOSIS — J309 Allergic rhinitis, unspecified: Secondary | ICD-10-CM | POA: Diagnosis not present

## 2014-09-19 ENCOUNTER — Ambulatory Visit (INDEPENDENT_AMBULATORY_CARE_PROVIDER_SITE_OTHER): Payer: Commercial Managed Care - HMO

## 2014-09-19 DIAGNOSIS — J309 Allergic rhinitis, unspecified: Secondary | ICD-10-CM | POA: Diagnosis not present

## 2014-09-22 ENCOUNTER — Ambulatory Visit: Payer: Commercial Managed Care - HMO

## 2014-09-25 DIAGNOSIS — G4733 Obstructive sleep apnea (adult) (pediatric): Secondary | ICD-10-CM | POA: Diagnosis not present

## 2014-09-29 ENCOUNTER — Ambulatory Visit (INDEPENDENT_AMBULATORY_CARE_PROVIDER_SITE_OTHER): Payer: Commercial Managed Care - HMO

## 2014-09-29 DIAGNOSIS — J309 Allergic rhinitis, unspecified: Secondary | ICD-10-CM

## 2014-09-30 ENCOUNTER — Other Ambulatory Visit: Payer: Self-pay | Admitting: *Deleted

## 2014-09-30 ENCOUNTER — Encounter: Payer: Self-pay | Admitting: Pharmacist

## 2014-09-30 ENCOUNTER — Ambulatory Visit (INDEPENDENT_AMBULATORY_CARE_PROVIDER_SITE_OTHER): Payer: Commercial Managed Care - HMO | Admitting: Pharmacist

## 2014-09-30 VITALS — BP 129/56 | HR 68 | Wt 188.5 lb

## 2014-09-30 DIAGNOSIS — E0840 Diabetes mellitus due to underlying condition with diabetic neuropathy, unspecified: Secondary | ICD-10-CM

## 2014-09-30 DIAGNOSIS — E782 Mixed hyperlipidemia: Secondary | ICD-10-CM | POA: Diagnosis not present

## 2014-09-30 DIAGNOSIS — I10 Essential (primary) hypertension: Secondary | ICD-10-CM | POA: Diagnosis not present

## 2014-09-30 DIAGNOSIS — E0949 Drug or chemical induced diabetes mellitus with neurological complications with other diabetic neurological complication: Secondary | ICD-10-CM

## 2014-09-30 LAB — POCT GLYCOSYLATED HEMOGLOBIN (HGB A1C): Hemoglobin A1C: 10.4

## 2014-09-30 LAB — LIPID PANEL
Cholesterol: 179 mg/dL (ref 0–200)
HDL: 34 mg/dL — ABNORMAL LOW (ref >39)
LDL Cholesterol: 104 mg/dL — ABNORMAL HIGH (ref 0–99)
Total CHOL/HDL Ratio: 5.3 Ratio
Triglycerides: 203 mg/dL — ABNORMAL HIGH (ref <150)
VLDL: 41 mg/dL — ABNORMAL HIGH (ref 0–40)

## 2014-09-30 MED ORDER — INSULIN ASPART 100 UNIT/ML FLEXPEN: PEN_INJECTOR | SUBCUTANEOUS | Status: DC

## 2014-09-30 MED ORDER — FUROSEMIDE 40 MG PO TABS: 40.0000 mg | ORAL_TABLET | Freq: Every day | ORAL | Status: DC

## 2014-09-30 MED ORDER — INSULIN GLARGINE 100 UNIT/ML SOLOSTAR PEN: 65.0000 [IU] | PEN_INJECTOR | Freq: Every day | SUBCUTANEOUS | Status: DC

## 2014-09-30 MED ORDER — METOPROLOL SUCCINATE ER 100 MG PO TB24: ORAL_TABLET | ORAL | Status: DC

## 2014-09-30 NOTE — Progress Notes (Signed)
Patient ID: [name] [name], female   DOB: 14-Jan-1951, 64 y.o.   MRN: [id number] Reviewed: Agree with Dr. Graylin Shiver documentation and management.

## 2014-09-30 NOTE — Telephone Encounter (Signed)
Ventolin is preferred; ProAir no longer covered under insurance. [employer] fax number [phone] Derl Barrow, RN

## 2014-09-30 NOTE — Assessment & Plan Note (Signed)
Obtained lipid panel today to assess current level of control.  Reports adherence with atorvastatin.   Anticipate LDL < 100.

## 2014-09-30 NOTE — Patient Instructions (Signed)
GREAT to see you today.   NO change in your blood pressure or diabetes medication.   I will send refills for several of your medicines today.   Next visit with Dr. Nori Riis.   Follow up in pharmacy clinic in early March.

## 2014-09-30 NOTE — Assessment & Plan Note (Signed)
Diabetes of many years duration currently under suboptimal control however improve recently with assistance of the [facility] case manager.  Single hypoglycemic event reported with a reading of 77.  She was able to verbalize appropriate hypoglycemia management plan.  Reports adherence with medication. A1C obtained today shows control is suboptimal which is likely due to in creased sedentary lifestyle AND dietary indiscretion which appears to be improved with the support of the [facility] case manager during the last several days.  Given recent dietary and motion exercises no change in diabetes medication therapy at this time.    Reinforced need for working toward a healthier weight.  Patient verbalized new goals for weight loss which were documented.  Written patient instructions provided.  Follow up in Pharmacist Clinic Visit in March.   Total time in face to face counseling 22 minutes.  Patient seen with Randell Patient, PharmD Candidate, and Nicoletta Ba, PharmD resident.   Obtained lipid panel today to assess current level of control.  Reports adherence with atorvastatin.   Anticipate LDL < 100.

## 2014-09-30 NOTE — Progress Notes (Signed)
S:    Patient arrives in good spirits walking without assistance accompanied by her new [facility] appointed aid - [place].   Presents for diabetes follow-up.   Patient reports adherence with medications. Current diabetes medications include Lantus and Novolog.   Patient reports one hypoglycemic event which occurred recently with a reading of 77.   She reported managing this event successfully.   Patient reported dietary habits: have improved since working with Tiajuana Amass from [facility] (specifically during the last week).   Patient reported exercise habits:  Minimal.     Patient continues to have issues with vision.    O:  . Lab Results  Component Value Date   HGBA1C 10.4 09/30/2014     Home fasting CBG: 100-340 during the month of January.   However, she has had ~ 8 of the last 11 readings were < 200.  This was attributed to the work of the [facility] RN [employer] forces logistics/support/administrative officer.    A/P: Diabetes of many years duration currently under suboptimal control however improve recently with assistance of the [facility] case manager.  Single hypoglycemic event reported with a reading of 77.  She was able to verbalize appropriate hypoglycemia management plan.  Reports adherence with medication. A1C obtained today shows control is suboptimal which is likely due to in creased sedentary lifestyle AND dietary indiscretion which appears to be improved with the support of the [facility] case manager during the last several days.  Given recent dietary and motion exercises no change in diabetes medication therapy at this time.    Reinforced need for working toward a healthier weight.  Patient verbalized new goals for weight loss which were documented.  Written patient instructions provided.  Follow up in Pharmacist Clinic Visit in March.   Total time in face to face counseling 22 minutes.  Patient seen with Randell Patient, PharmD Candidate, and Nicoletta Ba, PharmD resident.   Obtained lipid panel today to assess current level of control.   Reports adherence with atorvastatin.   Anticipate LDL < 100.    Multiple medications renewed including lantus, Novolog, metoprolol, and furosemide due to patient request.

## 2014-10-03 ENCOUNTER — Other Ambulatory Visit: Payer: Self-pay | Admitting: Family Medicine

## 2014-10-04 MED ORDER — ESOMEPRAZOLE MAGNESIUM 40 MG PO CPDR: 40.0000 mg | DELAYED_RELEASE_CAPSULE | Freq: Every day | ORAL | Status: DC

## 2014-10-04 MED ORDER — ALBUTEROL SULFATE HFA 108 (90 BASE) MCG/ACT IN AERS: 1.0000 | INHALATION_SPRAY | Freq: Four times a day (QID) | RESPIRATORY_TRACT | Status: DC | PRN

## 2014-10-05 ENCOUNTER — Telehealth: Payer: Self-pay | Admitting: Family Medicine

## 2014-10-05 NOTE — Telephone Encounter (Signed)
Please call patient back regarding results.

## 2014-10-05 NOTE — Telephone Encounter (Signed)
Called patient and gave her the A1C result from Friday.[name] [name], Kevin Fenton

## 2014-10-06 ENCOUNTER — Ambulatory Visit (INDEPENDENT_AMBULATORY_CARE_PROVIDER_SITE_OTHER): Payer: Commercial Managed Care - HMO

## 2014-10-06 DIAGNOSIS — J309 Allergic rhinitis, unspecified: Secondary | ICD-10-CM

## 2014-10-13 ENCOUNTER — Ambulatory Visit: Payer: Commercial Managed Care - HMO

## 2014-10-18 ENCOUNTER — Telehealth: Payer: Self-pay | Admitting: Family Medicine

## 2014-10-18 NOTE — Telephone Encounter (Signed)
Pt called because she needs the strips and supplies that go with her Aviva meter. jw

## 2014-10-19 ENCOUNTER — Telehealth: Payer: Self-pay | Admitting: Internal Medicine

## 2014-10-19 MED ORDER — ACCU-CHEK AVIVA PLUS W/DEVICE KIT: PACK | Status: DC

## 2014-10-19 MED ORDER — METOPROLOL SUCCINATE ER 50 MG PO TB24: 150.0000 mg | ORAL_TABLET | Freq: Every day | ORAL | Status: DC

## 2014-10-19 MED ORDER — GLUCOSE BLOOD VI STRP: ORAL_STRIP | Status: DC

## 2014-10-19 MED ORDER — GABAPENTIN 300 MG PO CAPS: ORAL_CAPSULE | ORAL | Status: DC

## 2014-10-19 NOTE — Telephone Encounter (Signed)
Received a call from [facility] with [place] stating they needed: 1. Prescription for Accu-Check Aviva Plus Meter and Test strips.  Pt test 3-4 times a day. 2. Need a correct Rx for Metoprolol 150 mg.  3. Pt told Angela Nevin she is taking Gabapentin 300 mg (600 mg in the morning, 300 mg midday/lunch and 600 mg at bedtime.  They need a new prescription to reflect that.  A Rx for gabapentin 300 mg was sent in for 2 capsules at night on 10/04/2014 by Dr. Nori Riis.   Derl Barrow, RN

## 2014-10-19 NOTE — Telephone Encounter (Signed)
Ok to refill cough syrup 

## 2014-10-19 NOTE — Telephone Encounter (Signed)
Called and spoke with pt and she is requesting a refill of the promethazine with codeine cough syrup.  Pt stated that she has to come in the morning for her allergy shot.  Pt stated that we can leave a message on her machine or tell TS that her rx has been approved. Pt is aware that CY is out of the office until in the am.  CY please advise if we can refill this for her.  Thanks  Last ov--07/06/14 Next ov--01/04/15  Allergies  Allergen Reactions  . Bee Venom Hives and Swelling  . Propoxyphene Hcl Itching  . Amlodipine Besylate Swelling  . Hydrocodone Other (See Comments)    With Vicodin - makes patient "jittery", and "hangover effect - sleepy next day"  . Lisinopril Cough    Changed to ARB  . Metformin And Related Diarrhea    GI distress  . Metronidazole Other (See Comments)    REACTION: "just didn't work; my body never did heal from it"  . Valsartan Other (See Comments)    REACTION:  "sleep more the next day after I took it; it made me real tired"    Current Outpatient Prescriptions on File Prior to Visit  Medication Sig Dispense Refill  . acyclovir (ZOVIRAX) 200 MG capsule TAKE 2 CAPSULES TWICE DAILY 360 capsule 3  . albuterol (PROVENTIL HFA;VENTOLIN HFA) 108 (90 BASE) MCG/ACT inhaler Inhale 1-2 puffs into the lungs every 6 (six) hours as needed for wheezing or shortness of breath. 1 Inhaler 12  . albuterol (PROVENTIL) (2.5 MG/3ML) 0.083% nebulizer solution Take 2.5 mg by nebulization every 6 (six) hours as needed for wheezing or shortness of breath.    . Alcohol Swabs (B-D SINGLE USE SWABS REGULAR) PADS     . allopurinol (ZYLOPRIM) 300 MG tablet TAKE 1 TABLET EVERY DAY 90 tablet 3  . ammonium lactate (LAC-HYDRIN) 12 % lotion Apply 1 application topically 2 (two) times daily as needed for dry skin.    [name] atorvastatin (LIPITOR) 40 MG tablet Take 1 tablet (40 mg total) by mouth daily. 90 tablet 3  . azelastine (ASTELIN) 0.1 % nasal spray Place 2 sprays into both nostrils daily. Use in each  nostril as directed    . B-D ULTRAFINE III SHORT PEN 31G X 8 MM MISC     . baclofen (LIORESAL) 10 MG tablet Take 5-10 mg by mouth daily as needed for muscle spasms.    . beclomethasone (QVAR) 80 MCG/ACT inhaler Inhale 2 puffs into the lungs daily.    . Blood Glucose Calibration (ACCU-CHEK SMARTVIEW CONTROL) LIQD     . Blood Glucose Monitoring Suppl (ACCU-CHEK AVIVA PLUS) W/DEVICE KIT Use to check blood sugar 3-4 times daily 1 kit 0  . brimonidine (ALPHAGAN P) 0.1 % SOLN Place 1 drop into the left eye 2 (two) times daily.     [name] EPINEPHrine 0.3 mg/0.3 mL IJ SOAJ injection Inject 0.3 mLs (0.3 mg total) into the muscle once as needed (for allergic reaction). (Patient not taking: Reported on 09/30/2014) 1 Device 3  . esomeprazole (NEXIUM) 40 MG capsule Take 1 capsule (40 mg total) by mouth daily. 90 capsule 3  . ferrous sulfate 325 (65 FE) MG tablet Take 325 mg by mouth daily.    . furosemide (LASIX) 40 MG tablet Take 1 tablet (40 mg total) by mouth daily. 30 tablet 3  . gabapentin (NEURONTIN) 300 MG capsule 2 tabs every morning, 1 tab at noon, and 2 tabs every evening. 450 capsule 3  .  glucose blood test strip Test blood sugar 3-4 times daily 400 each 12  . insulin aspart (NOVOLOG) 100 UNIT/ML FlexPen Inject 30 units with early meal and 30 units with late meal. Dispense Quantity sufficient. 15 mL 5  . Lancets MISC Use twice or three times daily to check blood sugar 200 each 12  . LANTUS SOLOSTAR 100 UNIT/ML Solostar Pen INJECT  40 UNITS SUBCUTANEOUSLY EVERY DAY 45 mL 3  . latanoprost (XALATAN) 0.005 % ophthalmic solution Place 1 drop into both eyes at bedtime.    [name] losartan (COZAAR) 100 MG tablet TAKE 1 TABLET EVERY DAY 90 tablet 3  . metoprolol succinate (TOPROL-XL) 50 MG 24 hr tablet Take 3 tablets (150 mg total) by mouth daily. Take with or immediately following a meal. 270 tablet 3  . montelukast (SINGULAIR) 10 MG tablet TAKE 1 TABLET EVERY DAY 90 tablet 3  . Multiple Vitamins-Minerals  (MULTIVITAMIN PO) Take 1 tablet by mouth daily.    . nitroGLYCERIN (NITROSTAT) 0.4 MG SL tablet Place 0.4 mg under the tongue every 5 (five) minutes as needed for chest pain.    . polyethylene glycol (MIRALAX / GLYCOLAX) packet Take 17 g by mouth daily as needed. For constipation    . salicylic acid 17 % gel Apply topically 2 (two) times daily. Apply small amount to callous area on bottom of foot BID 14 g 3  . sertraline (ZOLOFT) 100 MG tablet TAKE 1 AND 1/2 TABLETS AT BEDTIME 135 tablet 3  . spironolactone (ALDACTONE) 25 MG tablet Take 1 tablet (25 mg total) by mouth daily before supper. 90 tablet 3  . Vitamin D, Ergocalciferol, (DRISDOL) 50000 UNITS CAPS capsule Take 1 capsule (50,000 Units total) by mouth every 30 (thirty) days. Take on 3rd of the month     No current facility-administered medications on file prior to visit.

## 2014-10-20 ENCOUNTER — Ambulatory Visit (INDEPENDENT_AMBULATORY_CARE_PROVIDER_SITE_OTHER): Payer: Commercial Managed Care - HMO

## 2014-10-20 DIAGNOSIS — J309 Allergic rhinitis, unspecified: Secondary | ICD-10-CM | POA: Diagnosis not present

## 2014-10-20 MED ORDER — PROMETHAZINE-CODEINE 6.25-10 MG/5ML PO SYRP: 5.0000 mL | ORAL_SOLUTION | Freq: Four times a day (QID) | ORAL | Status: DC | PRN

## 2014-10-20 NOTE — Telephone Encounter (Signed)
Rx has been called in. Pt is aware. Nothing further was needed. 

## 2014-10-24 ENCOUNTER — Encounter: Payer: Self-pay | Admitting: *Deleted

## 2014-10-24 NOTE — Progress Notes (Signed)
Prior Authorization received from [place] for Metoprolol.  PA completed online 10/21/2014 at [email or web address].  PA was approved; case number: [id number] through 10/22/2014-10/23/2015.   Derl Barrow, RN

## 2014-10-26 ENCOUNTER — Ambulatory Visit (INDEPENDENT_AMBULATORY_CARE_PROVIDER_SITE_OTHER): Payer: Commercial Managed Care - HMO | Admitting: Family Medicine

## 2014-10-26 ENCOUNTER — Encounter: Payer: Self-pay | Admitting: Family Medicine

## 2014-10-26 VITALS — BP 109/54 | HR 82 | Temp 98.3°F | Ht 61.5 in | Wt 192.1 lb

## 2014-10-26 DIAGNOSIS — L603 Nail dystrophy: Secondary | ICD-10-CM

## 2014-10-26 DIAGNOSIS — E0949 Drug or chemical induced diabetes mellitus with neurological complications with other diabetic neurological complication: Secondary | ICD-10-CM | POA: Diagnosis not present

## 2014-10-26 DIAGNOSIS — G4733 Obstructive sleep apnea (adult) (pediatric): Secondary | ICD-10-CM | POA: Diagnosis not present

## 2014-10-27 ENCOUNTER — Ambulatory Visit (INDEPENDENT_AMBULATORY_CARE_PROVIDER_SITE_OTHER): Payer: Commercial Managed Care - HMO

## 2014-10-27 DIAGNOSIS — J309 Allergic rhinitis, unspecified: Secondary | ICD-10-CM

## 2014-10-27 NOTE — Assessment & Plan Note (Signed)
I do not want to change her Lantus dose at this time. My concern is that if we get her control to type and she will start having hypoglycemic episodes, particularly when she is nearing the end of her financial month and food is somewhat scarce. She has follow-up with pharmacy clinic in one month.

## 2014-10-27 NOTE — Assessment & Plan Note (Signed)
Will update her podiatry referral. She also needs some new diabetic shoes negative her prescription for those. We trimmed her great toenails bilaterally today

## 2014-10-27 NOTE — Progress Notes (Signed)
   Subjective:    Patient ID: [name] [name], female    DOB: [birth year], 64 y.o.   MRN: [id number]  HPI #1. Here with some questions about her medications. Her insurance is 1 her to switch couple of them around. #2. She's having some episodic lightheadedness when she stands up from a seated position. Notices it more when she's also not eaten for some time. #3. Diabetes mellitus. Brings her list of blood sugars. In January she was ranging from 200s to 300s. The last 2-3 weeks it has been slightly lower, ranging from the upper 100s to the mid 200s with only an occasional 300. She does report eating erratically, partly based on financial stressors particular toward the end of the month. #4. Needs updated referrals for podiatry, ophthalmology, headache doctor, #5. Mostly look at her toe nails and see if they need trimming   Review of Systems See history of present illness. No episodes of overt low blood sugar. She's had no increase in urination, no increase in thirst. No unusual weight change, fever, sweats, chills. Denies chest pain, denies unusual shortness of breath    Objective:   Physical Exam  Vital signs are reviewed GEN.: Well-developed female no acute distress CV: Regular rate and rhythm without murmur gallop or rub LUNGS: Clear to auscultation bilaterally. No wheeze. Good air movement. ABDOMEN: Soft, positive bowel sounds nontender nondistended next line  EXTREMITY: She is walking with a cane today she has a fairly unsteady gait even with the cane at times. She is particularly unbalanced when she first gets up from seated position. NEURO: Decreased sensation to soft touch bilateral feet up to about mid way of her lower leg. Similar decreased sensation in the hands. Also decreased dexterity bilateral hands/fingers. Legally blind. Resting nystagmus noted. PSYCHIATRIC: Alert and oriented 4. Interactive. Asks and answers questions appropriately. Good sense of humor. FEET: Diabetic foot  exam reveals decreased sensation as discussed above. She has longer nails on the great toe but otherwise her nails are in good shape. We trimmed dose today. She's got no excess callus.      Assessment & Plan:

## 2014-11-01 ENCOUNTER — Other Ambulatory Visit: Payer: Self-pay | Admitting: Family Medicine

## 2014-11-01 DIAGNOSIS — E1149 Type 2 diabetes mellitus with other diabetic neurological complication: Secondary | ICD-10-CM

## 2014-11-01 DIAGNOSIS — R519 Headache, unspecified: Secondary | ICD-10-CM

## 2014-11-01 DIAGNOSIS — K219 Gastro-esophageal reflux disease without esophagitis: Secondary | ICD-10-CM

## 2014-11-01 DIAGNOSIS — G8929 Other chronic pain: Secondary | ICD-10-CM

## 2014-11-01 DIAGNOSIS — H409 Unspecified glaucoma: Secondary | ICD-10-CM

## 2014-11-01 DIAGNOSIS — R51 Headache: Secondary | ICD-10-CM

## 2014-11-01 DIAGNOSIS — Z9849 Cataract extraction status, unspecified eye: Secondary | ICD-10-CM

## 2014-11-01 DIAGNOSIS — R1084 Generalized abdominal pain: Secondary | ICD-10-CM

## 2014-11-04 ENCOUNTER — Ambulatory Visit (INDEPENDENT_AMBULATORY_CARE_PROVIDER_SITE_OTHER): Payer: Commercial Managed Care - HMO

## 2014-11-04 ENCOUNTER — Ambulatory Visit (INDEPENDENT_AMBULATORY_CARE_PROVIDER_SITE_OTHER): Payer: Commercial Managed Care - HMO | Admitting: Pharmacist

## 2014-11-04 ENCOUNTER — Encounter: Payer: Self-pay | Admitting: Pharmacist

## 2014-11-04 VITALS — BP 132/56 | HR 75 | Ht 61.5 in | Wt 199.0 lb

## 2014-11-04 DIAGNOSIS — E1140 Type 2 diabetes mellitus with diabetic neuropathy, unspecified: Secondary | ICD-10-CM | POA: Diagnosis not present

## 2014-11-04 DIAGNOSIS — J309 Allergic rhinitis, unspecified: Secondary | ICD-10-CM | POA: Diagnosis not present

## 2014-11-04 DIAGNOSIS — E1149 Type 2 diabetes mellitus with other diabetic neurological complication: Secondary | ICD-10-CM

## 2014-11-04 MED ORDER — DULAGLUTIDE 0.75 MG/0.5ML ~~LOC~~ SOAJ: 0.7500 mg | SUBCUTANEOUS | Status: DC

## 2014-11-04 NOTE — Progress Notes (Signed)
S:    Patient arrives in good spirits with the use of a cane. Presents for diabetes follow up.   Patient denies adherence with medications. Current diabetes medications include Novolog 30 units twice daily with meals and Lantus 65 units in the morning.   Patient denies hypoglycemic events. Reports that she would eat something sweet or have orange juice if it were low.   Patient reported dietary habits: green beans, slaw, chicken legs, popcorn shrimp. Reports drinking mostly water but sometimes will have a regular soda. Trying to eat two meals per day but can only eat one meal a day sometimes.   Patient reported exercise habits: denies exercise because she has been sleeping a lot.    Patient reports nocturia about 3-4 times per night. Patient reports neuropathy in her right leg and sharp pain in her left leg. Patient denies visual changes.   Of note, patient reports that Tiajuana Amass, [facility] nurse case manager, is no longer working with patient and she is now being followed telephonically by a different nurse.    O:  . Lab Results  Component Value Date   HGBA1C 10.4 09/30/2014     Home fasting CBG: 200s-300s 2 hour post-prandial/random CBG: 100s-300s.  7 day average of 280 mg/dL.  Home blood pressure readings: 110s-130s/70s-90s  A/P: Longstanding diabetes currently uncontrolled on Lantus and Novolog.   Denies hypoglycemic events and is able to verbalize appropriate hypoglycemia management plan.  Reports adherence with medication. Control is suboptimal due to dietary indiscretion and physical inactivity. Continued basal insulin Lantus (insulin glargine) 65 units every morning. Continued rapid insulin Novolog (insulin aspart) 30 units prior to breakfast and dinner.  Plan to initiate Trulicity (dulaglutide) 0.75 mg every week. Expect this to assist with blood glucose and control as well as aid in weight loss efforts. Patient counseled on side effects of medication. Patient to pick up  medication from pharmacy and will bring it next week for education. Patient to not start Trulicity until education has been provided. Encouraged patient to try to exercise and move around her home.   Next A1C anticipated April 2016.  Written patient instructions provided.  Follow up in Pharmacist Clinic Visit next Thursday, March 10th.  Total time in face to face counseling 30 minutes.  Patient seen with Elicia Lamp,  PharmD Resident, and Nicoletta Ba, PharmD Resident.

## 2014-11-04 NOTE — Patient Instructions (Signed)
It was good to see you today Ms. Belknap!  Go get the Trulicity pen and bring it to your appointment next week. We will show you how to use it.   Keep taking Novolog 30 units twice daily with meals (morning meal and late meal) and Lantus 65 units in the morning.

## 2014-11-04 NOTE — Assessment & Plan Note (Signed)
Longstanding diabetes currently uncontrolled on Lantus and Novolog.   Denies hypoglycemic events and is able to verbalize appropriate hypoglycemia management plan.  Reports adherence with medication. Control is suboptimal due to dietary indiscretion and physical inactivity. Continued basal insulin Lantus (insulin glargine) 65 units every morning. Continued rapid insulin Novolog (insulin aspart) 30 units prior to breakfast and dinner.  Plan to initiate Trulicity (dulaglutide) 0.75 mg every week. Expect this to assist with blood glucose and control as well as aid in weight loss efforts. Patient counseled on side effects of medication. Patient to pick up medication from pharmacy and will bring it next week for education. Patient to not start Trulicity until education has been provided. Encouraged patient to try to exercise and move around her home.

## 2014-11-07 DIAGNOSIS — G4733 Obstructive sleep apnea (adult) (pediatric): Secondary | ICD-10-CM | POA: Diagnosis not present

## 2014-11-07 NOTE — Progress Notes (Signed)
Patient ID: [name] [name], female   DOB: 07-19-1951, 64 y.o.   MRN: [id number] Reviewed: Agree with Dr. Graylin Shiver documentation and management.

## 2014-11-10 ENCOUNTER — Ambulatory Visit (INDEPENDENT_AMBULATORY_CARE_PROVIDER_SITE_OTHER): Payer: Commercial Managed Care - HMO

## 2014-11-10 ENCOUNTER — Encounter: Payer: Self-pay | Admitting: Pharmacist

## 2014-11-10 ENCOUNTER — Ambulatory Visit (INDEPENDENT_AMBULATORY_CARE_PROVIDER_SITE_OTHER): Payer: Commercial Managed Care - HMO | Admitting: Pharmacist

## 2014-11-10 VITALS — BP 106/65 | HR 73 | Ht 61.0 in | Wt 196.5 lb

## 2014-11-10 DIAGNOSIS — E1149 Type 2 diabetes mellitus with other diabetic neurological complication: Secondary | ICD-10-CM

## 2014-11-10 DIAGNOSIS — E1140 Type 2 diabetes mellitus with diabetic neuropathy, unspecified: Secondary | ICD-10-CM | POA: Diagnosis not present

## 2014-11-10 DIAGNOSIS — J309 Allergic rhinitis, unspecified: Secondary | ICD-10-CM

## 2014-11-10 NOTE — Assessment & Plan Note (Signed)
Chronically poor control of diabetes with multiple contributing factors.  Initiating Trulicity in office today.  Following instruction on proper use, she demonstrated first dose injection in office. She also voiced understanding about proper handling and storage of her new medication in addition to common side effects of the medication. She will take her Trulicity once weekly on Thursdays and continue taking her other medications as prescribed. Next A1C anticipated May 2016

## 2014-11-10 NOTE — Progress Notes (Signed)
Patient ID: [name] [name], female   DOB: 01-30-1951, 64 y.o.   MRN: [id number] Reviewed: Agree with Dr. Graylin Shiver documentation and management.

## 2014-11-10 NOTE — Patient Instructions (Signed)
Nice to see you today.  Inject Trulicity pen once weekly on Thursdays. Try to be consistent once you choose a time of day. Continue taking your other diabetes medications as usual.  Follow up with pharmacy clinic in early April.

## 2014-11-10 NOTE — Progress Notes (Signed)
S:    Patient arrives in great spirits and ambulating well with a cane. Presents for follow-up on Trulicity initiation.   Patient reports adherence with medications.   She recently picked up her Trulicity from [facility] and brought it in with her to receive instruction on its proper use. She has poor eyesight and will benefit from in-person instruction on how and when to inject her new medication.    O:  . Lab Results  Component Value Date   HGBA1C 10.4 09/30/2014     A/P: Chronically poor control of diabetes with multiple contributing factors.  Initiating Trulicity in office today.  Following instruction on proper use, she demonstrated first dose injection in office. She also voiced understanding about proper handling and storage of her new medication in addition to common side effects of the medication. She will take her Trulicity once weekly on Thursdays and continue taking her other medications as prescribed.   Next A1C anticipated May 2016.  Written patient instructions provided.  Follow up in Pharmacist Clinic Visit in the beginning of April 2016.   Total time in face to face counseling 20  minutes.  Patient seen with Richarda Osmond, PharmD Candidate, Elicia Lamp, PharmD, and Nicholas Lose, PharmD, CPP, BCPS.

## 2014-11-17 ENCOUNTER — Ambulatory Visit (INDEPENDENT_AMBULATORY_CARE_PROVIDER_SITE_OTHER): Payer: Commercial Managed Care - HMO

## 2014-11-17 DIAGNOSIS — J309 Allergic rhinitis, unspecified: Secondary | ICD-10-CM | POA: Diagnosis not present

## 2014-11-21 ENCOUNTER — Ambulatory Visit: Payer: Self-pay | Admitting: *Deleted

## 2014-11-24 ENCOUNTER — Emergency Department (HOSPITAL_COMMUNITY)
Admission: EM | Admit: 2014-11-24 | Discharge: 2014-11-24 | Disposition: A | Discharge status: Home / Self Care | Payer: Commercial Managed Care - HMO | Attending: Emergency Medicine | Admitting: Emergency Medicine

## 2014-11-24 ENCOUNTER — Ambulatory Visit (INDEPENDENT_AMBULATORY_CARE_PROVIDER_SITE_OTHER): Payer: Commercial Managed Care - HMO

## 2014-11-24 ENCOUNTER — Encounter (HOSPITAL_COMMUNITY): Payer: Self-pay

## 2014-11-24 DIAGNOSIS — Z794 Long term (current) use of insulin: Secondary | ICD-10-CM | POA: Insufficient documentation

## 2014-11-24 DIAGNOSIS — Z8739 Personal history of other diseases of the musculoskeletal system and connective tissue: Secondary | ICD-10-CM | POA: Diagnosis not present

## 2014-11-24 DIAGNOSIS — G4733 Obstructive sleep apnea (adult) (pediatric): Secondary | ICD-10-CM | POA: Diagnosis not present

## 2014-11-24 DIAGNOSIS — Z8701 Personal history of pneumonia (recurrent): Secondary | ICD-10-CM | POA: Insufficient documentation

## 2014-11-24 DIAGNOSIS — Z87891 Personal history of nicotine dependence: Secondary | ICD-10-CM | POA: Diagnosis not present

## 2014-11-24 DIAGNOSIS — Z7951 Long term (current) use of inhaled steroids: Secondary | ICD-10-CM | POA: Diagnosis not present

## 2014-11-24 DIAGNOSIS — R011 Cardiac murmur, unspecified: Secondary | ICD-10-CM | POA: Insufficient documentation

## 2014-11-24 DIAGNOSIS — R404 Transient alteration of awareness: Secondary | ICD-10-CM | POA: Diagnosis not present

## 2014-11-24 DIAGNOSIS — J309 Allergic rhinitis, unspecified: Secondary | ICD-10-CM

## 2014-11-24 DIAGNOSIS — E119 Type 2 diabetes mellitus without complications: Secondary | ICD-10-CM | POA: Diagnosis not present

## 2014-11-24 DIAGNOSIS — G8929 Other chronic pain: Secondary | ICD-10-CM | POA: Insufficient documentation

## 2014-11-24 DIAGNOSIS — Z8659 Personal history of other mental and behavioral disorders: Secondary | ICD-10-CM | POA: Diagnosis not present

## 2014-11-24 DIAGNOSIS — Z79899 Other long term (current) drug therapy: Secondary | ICD-10-CM | POA: Insufficient documentation

## 2014-11-24 DIAGNOSIS — K219 Gastro-esophageal reflux disease without esophagitis: Secondary | ICD-10-CM | POA: Insufficient documentation

## 2014-11-24 DIAGNOSIS — J449 Chronic obstructive pulmonary disease, unspecified: Secondary | ICD-10-CM | POA: Insufficient documentation

## 2014-11-24 DIAGNOSIS — E785 Hyperlipidemia, unspecified: Secondary | ICD-10-CM | POA: Diagnosis not present

## 2014-11-24 DIAGNOSIS — R55 Syncope and collapse: Secondary | ICD-10-CM | POA: Diagnosis present

## 2014-11-24 DIAGNOSIS — R42 Dizziness and giddiness: Secondary | ICD-10-CM | POA: Insufficient documentation

## 2014-11-24 LAB — CBC WITH DIFFERENTIAL/PLATELET
Basophils Absolute: 0 10*3/uL (ref 0.0–0.1)
Basophils Relative: 0 % (ref 0–1)
Eosinophils Absolute: 0.2 10*3/uL (ref 0.0–0.7)
Eosinophils Relative: 3 % (ref 0–5)
HCT: 37.1 % (ref 36.0–46.0)
Hemoglobin: 11.6 g/dL — ABNORMAL LOW (ref 12.0–15.0)
Lymphocytes Relative: 40 % (ref 12–46)
Lymphs Abs: 3.2 10*3/uL (ref 0.7–4.0)
MCH: 28.9 pg (ref 26.0–34.0)
MCHC: 31.3 g/dL (ref 30.0–36.0)
MCV: 92.3 fL (ref 78.0–100.0)
Monocytes Absolute: 0.4 10*3/uL (ref 0.1–1.0)
Monocytes Relative: 5 % (ref 3–12)
Neutro Abs: 4.2 10*3/uL (ref 1.7–7.7)
Neutrophils Relative %: 52 % (ref 43–77)
Platelets: 205 10*3/uL (ref 150–400)
RBC: 4.02 MIL/uL (ref 3.87–5.11)
RDW: 14.9 % (ref 11.5–15.5)
WBC: 8.1 10*3/uL (ref 4.0–10.5)

## 2014-11-24 LAB — BASIC METABOLIC PANEL
Anion gap: 8 (ref 5–15)
BUN: 17 mg/dL (ref 6–23)
CO2: 29 mmol/L (ref 19–32)
Calcium: 9.4 mg/dL (ref 8.4–10.5)
Chloride: 103 mmol/L (ref 96–112)
Creatinine, Ser: 0.71 mg/dL (ref 0.50–1.10)
GFR calc Af Amer: >90 mL/min (ref >90)
GFR calc non Af Amer: >90 mL/min (ref >90)
Glucose, Bld: 86 mg/dL (ref 70–99)
Potassium: 3.7 mmol/L (ref 3.5–5.1)
Sodium: 140 mmol/L (ref 135–145)

## 2014-11-24 LAB — CBG MONITORING, ED: Glucose-Capillary: 77 mg/dL (ref 70–99)

## 2014-11-24 MED ORDER — SODIUM CHLORIDE 0.9 % IV BOLUS (SEPSIS)
1000.0000 mL | Freq: Once | INTRAVENOUS | Status: AC
  Administered 2014-11-24: 1000 mL via INTRAVENOUS

## 2014-11-24 MED ORDER — SODIUM CHLORIDE 0.9 % IV SOLN
INTRAVENOUS | Status: DC
  Administered 2014-11-24: 16:00:00 via INTRAVENOUS

## 2014-11-24 NOTE — ED Provider Notes (Signed)
CSN: [id number]     Arrival date & time 11/24/14  1553 History   First MD Initiated Contact with Patient 11/24/14 1614     Chief Complaint  Patient presents with  . Near Syncope     (Consider location/radiation/quality/duration/timing/severity/associated sxs/prior Treatment) HPI Comments: Patient here after having a near syncopal episode prior to arrival. States that she does have diabetes and took her regular medications but did not have a meal to eat. Became jittery and slightly diaphoretic but did not have any chest pain or shortness of breath. No abdominal pain. No recent illnesses. CBG was 99. She feels better at this time. [facility] called and patient transported here. It was noted that she was mildly orthostatic.  Patient is a 64 y.o. female presenting with near-syncope. The history is provided by the patient.  Near Syncope    Past Medical History  Diagnosis Date  . Chronic cough     Now followed by pulmonary  . Sleep apnea     on CPAP 12  . HTN (hypertension)   . Dyslipidemia   . Gout   . History of colonoscopy   . Concussion 11/18/11    "fell at dr's office; hit head"  . Complication of anesthesia     "just wake up coughing; that's all"  . Heart murmur   . Asthma   . COPD (chronic obstructive pulmonary disease)   . Diabetes mellitus type II     "take insulin & pills"  . Blood transfusion   . GERD (gastroesophageal reflux disease)   . Arthritis   . Chronic lower back pain   . Vocal cord paralysis, unilateral partial     "right"  . Depression   . Peripheral neuropathy   . Angina   . Pneumonia     "i've had it once" (11/18/11)  . Exertional dyspnea   . Headache(784.0) 11/18/11    "I've had mild headaches the last couple days"  . Headache(784.0) 01/08/12    "pretty constant since 11/18/11's concussion"  . Syncope 06/16/2012   Past Surgical History  Procedure Laterality Date  . Colonoscopy w/ biopsies  11/21/2010    adenoma polyp--no hi grade dysplasia Dr Collene Mares  .  Treatment fistula anal    . Breast reduction surgery  04-21-2003  . Tubal ligation  1987  . Cardiac catheterization  03-02-2004  . Bronchoscopy  07-2007  . Knee arthroscopy  08/2000    right  . Shoulder arthroscopy  08/2000    right  . T-score  09/02/04    -0.54 (low risk currently)  . Total abdominal hysterectomy  10-07-2000  . Cesarean section  1987  . Shoulder arthroscopy w/ rotator cuff repair  10/18/2003    left  . Cataract extraction  1955; 1979    bilateral; right eye  . Eye surgery    . Breast biopsy      bilaterally  . Breast cyst excision      right twice; left once  . Carpal tunnel release      left  . Anterior cervical decomp/discectomy fusion  01/15/2012    Procedure: ANTERIOR CERVICAL DECOMPRESSION/DISCECTOMY FUSION 3 LEVELS;  Surgeon: Eustace Moore, MD;  Location: [facility] [facility];  Service: Neurosurgery;  Laterality: N/A;  Anterior Cervical Decompression Discectomy Fusion Cerivcal three four, cervical four five, cervical five six, cervical six seven  . Left and right heart catheterization with coronary angiogram N/A 01/19/2014    Procedure: LEFT AND RIGHT HEART CATHETERIZATION WITH CORONARY ANGIOGRAM;  Surgeon: Mallie Mussel  Carlye Grippe, MD;  Location: [facility] CATH LAB;  Service: Cardiovascular;  Laterality: N/A;   Family History  Problem Relation Age of Onset  . Diabetes Mother   . Colon cancer Neg Hx    History  Substance Use Topics  . Smoking status: Former Smoker -- 0.50 packs/day for .5 years    Types: Cigarettes    Quit date: 09/03/1975  . Smokeless tobacco: Never Used  . Alcohol Use: No   OB History    No data available     Review of Systems  Cardiovascular: Positive for near-syncope.  All other systems reviewed and are negative.     Allergies  Bee venom; Propoxyphene hcl; Amlodipine besylate; Hydrocodone; Lisinopril; Metformin and related; Metronidazole; and Valsartan  Home Medications   Prior to Admission medications   Medication Sig Start Date End Date  Taking? Authorizing Provider  acyclovir (ZOVIRAX) 200 MG capsule TAKE 2 CAPSULES TWICE DAILY 10/04/14   Dickie La, MD  albuterol (PROVENTIL HFA;VENTOLIN HFA) 108 (90 BASE) MCG/ACT inhaler Inhale 1-2 puffs into the lungs every 6 (six) hours as needed for wheezing or shortness of breath. 10/04/14   Dickie La, MD  albuterol (PROVENTIL) (2.5 MG/3ML) 0.083% nebulizer solution Take 2.5 mg by nebulization every 6 (six) hours as needed for wheezing or shortness of breath.    Historical Provider, MD  Alcohol Swabs (B-D SINGLE USE SWABS REGULAR) PADS  07/01/14   Historical Provider, MD  allopurinol (ZYLOPRIM) 300 MG tablet TAKE 1 TABLET EVERY DAY 10/04/14   Dickie La, MD  ammonium lactate (LAC-HYDRIN) 12 % lotion Apply 1 application topically 2 (two) times daily as needed for dry skin.    Historical Provider, MD  atorvastatin (LIPITOR) 40 MG tablet Take 1 tablet (40 mg total) by mouth daily. 08/29/14   Dickie La, MD  azelastine (ASTELIN) 0.1 % nasal spray Place 2 sprays into both nostrils daily. Use in each nostril as directed    Historical Provider, MD  B-D ULTRAFINE III SHORT PEN 31G X 8 MM MISC  06/20/14   Historical Provider, MD  baclofen (LIORESAL) 10 MG tablet Take 5-10 mg by mouth daily as needed for muscle spasms.    Historical Provider, MD  beclomethasone (QVAR) 80 MCG/ACT inhaler Inhale 2 puffs into the lungs daily.    Historical Provider, MD  Blood Glucose Calibration (ACCU-CHEK SMARTVIEW CONTROL) LIQD  07/01/14   Historical Provider, MD  Blood Glucose Monitoring Suppl (ACCU-CHEK AVIVA PLUS) W/DEVICE KIT Use to check blood sugar 3-4 times daily 10/19/14   Zenia Resides, MD  brimonidine (ALPHAGAN P) 0.1 % SOLN Place 1 drop into the left eye 2 (two) times daily.     Historical Provider, MD  Dulaglutide (TRULICITY) 2.68 TM/1.9QQ SOPN Inject 0.75 mg into the skin once a week. Patient not taking: Reported on 11/10/2014 11/04/14   Zenia Resides, MD  EPINEPHrine 0.3 mg/0.3 mL IJ SOAJ injection Inject  0.3 mLs (0.3 mg total) into the muscle once as needed (for allergic reaction). 08/09/14   Dickie La, MD  esomeprazole (NEXIUM) 40 MG capsule Take 1 capsule (40 mg total) by mouth daily. 10/04/14   Dickie La, MD  ferrous sulfate 325 (65 FE) MG tablet Take 325 mg by mouth daily.    Historical Provider, MD  furosemide (LASIX) 40 MG tablet Take 1 tablet (40 mg total) by mouth daily. 09/30/14   Dickie La, MD  gabapentin (NEURONTIN) 300 MG capsule 2 tabs every morning, 1  tab at noon, and 2 tabs every evening. 10/19/14   Zenia Resides, MD  glucose blood test strip Test blood sugar 3-4 times daily 10/19/14   Zenia Resides, MD  insulin aspart (NOVOLOG) 100 UNIT/ML FlexPen Inject 30 units with early meal and 30 units with late meal. Dispense Quantity sufficient. 09/30/14   Dickie La, MD  Lancets MISC Use twice or three times daily to check blood sugar 08/09/14   Dickie La, MD  LANTUS SOLOSTAR 100 UNIT/ML Solostar Pen INJECT  [place] DAY Patient taking differently: INJECT  64 UNITS SUBCUTANEOUSLY EVERY DAY 10/04/14   Dickie La, MD  latanoprost (XALATAN) 0.005 % ophthalmic solution Place 1 drop into both eyes at bedtime.    Historical Provider, MD  losartan (COZAAR) 100 MG tablet TAKE 1 TABLET EVERY DAY 10/04/14   Dickie La, MD  metoprolol succinate (TOPROL-XL) 50 MG 24 hr tablet Take 3 tablets (150 mg total) by mouth daily. Take with or immediately following a meal. 10/19/14   Zenia Resides, MD  montelukast (SINGULAIR) 10 MG tablet TAKE 1 TABLET EVERY DAY 10/04/14   Dickie La, MD  Multiple Vitamins-Minerals (MULTIVITAMIN PO) Take 1 tablet by mouth daily.    Historical Provider, MD  nitroGLYCERIN (NITROSTAT) 0.4 MG SL tablet Place 0.4 mg under the tongue every 5 (five) minutes as needed for chest pain.    Historical Provider, MD  polyethylene glycol (MIRALAX / GLYCOLAX) packet Take 17 g by mouth daily as needed. For constipation    Historical Provider, MD  promethazine-codeine  (PHENERGAN WITH CODEINE) 6.25-10 MG/5ML syrup Take 5 mLs by mouth every 6 (six) hours as needed for cough. 10/20/14   Deneise Lever, MD  salicylic acid 17 % gel Apply topically 2 (two) times daily. Apply small amount to callous area on bottom of foot BID 02/10/14   Boykin Nearing, MD  sertraline (ZOLOFT) 100 MG tablet TAKE 1 AND 1/2 TABLETS AT BEDTIME 10/04/14   Dickie La, MD  spironolactone (ALDACTONE) 25 MG tablet Take 1 tablet (25 mg total) by mouth daily before supper. 08/29/14   Dickie La, MD  Vitamin D, Ergocalciferol, (DRISDOL) 50000 UNITS CAPS capsule Take 1 capsule (50,000 Units total) by mouth every 30 (thirty) days. Take on 3rd of the month 07/18/14   Zenia Resides, MD   BP 124/66 mmHg  Pulse 68  Temp(Src) 97.9 F (36.6 C) (Oral)  Resp 13  SpO2 96% Physical Exam  Constitutional: She is oriented to person, place, and time. She appears well-developed and well-nourished.  Non-toxic appearance. No distress.  HENT:  Head: Normocephalic and atraumatic.  Eyes: Conjunctivae, EOM and lids are normal. Pupils are equal, round, and reactive to light.  Neck: Normal range of motion. Neck supple. No tracheal deviation present. No thyroid mass present.  Cardiovascular: Normal rate, regular rhythm and normal heart sounds.  Exam reveals no gallop.   No murmur heard. Pulmonary/Chest: Effort normal and breath sounds normal. No stridor. No respiratory distress. She has no decreased breath sounds. She has no wheezes. She has no rhonchi. She has no rales.  Abdominal: Soft. Normal appearance and bowel sounds are normal. She exhibits no distension. There is no tenderness. There is no rebound and no CVA tenderness.  Musculoskeletal: Normal range of motion. She exhibits no edema or tenderness.  Neurological: She is alert and oriented to person, place, and time. She has normal strength. No cranial nerve deficit or sensory deficit. GCS  eye subscore is 4. GCS verbal subscore is 5. GCS motor subscore is 6.   Skin: Skin is warm and dry. No abrasion and no rash noted.  Psychiatric: She has a normal mood and affect. Her speech is normal and behavior is normal.  Nursing note and vitals reviewed.   ED Course  Procedures (including critical care time) Labs Review Labs Reviewed  CBC WITH DIFFERENTIAL/PLATELET  BASIC METABOLIC PANEL  CBG MONITORING, ED    Imaging Review No results found.   EKG Interpretation   Date/Time:  Thursday November 24 2014 16:02:21 EDT Ventricular Rate:  71 PR Interval:  157 QRS Duration: 93 QT Interval:  425 QTC Calculation: 462 R Axis:   -43 Text Interpretation:  Sinus rhythm Left anterior fascicular block No  significant change since last tracing Confirmed by Mithran Strike  MD, Samya Siciliano  ([id number]) on 11/24/2014 4:21:58 PM      MDM   Final diagnoses:  None    Patient given IV fluids and a meal to eat here feels better. Suspect that patient might have had a hypoglycemic episode due to dietary issues. She is stable for discharge    Lacretia Leigh, MD 11/24/14 [phone]

## 2014-11-24 NOTE — Discharge Instructions (Signed)
Dizziness °Dizziness is a common problem. It is a feeling of unsteadiness or light-headedness. You may feel like you are about to faint. Dizziness can lead to injury if you stumble or fall. A person of any age group can suffer from dizziness, but dizziness is more common in older adults. °CAUSES  °Dizziness can be caused by many different things, including: °· Middle ear problems. °· Standing for too long. °· Infections. °· An allergic reaction. °· Aging. °· An emotional response to something, such as the sight of blood. °· Side effects of medicines. °· Tiredness. °· Problems with circulation or blood pressure. °· Excessive use of alcohol or medicines, or illegal drug use. °· Breathing too fast (hyperventilation). °· An irregular heart rhythm (arrhythmia). °· A low red blood cell count (anemia). °· Pregnancy. °· Vomiting, diarrhea, fever, or other illnesses that cause body fluid loss (dehydration). °· Diseases or conditions such as Parkinson's disease, high blood pressure (hypertension), diabetes, and thyroid problems. °· Exposure to extreme heat. °DIAGNOSIS  °Your health care provider will ask about your symptoms, perform a physical exam, and perform an electrocardiogram (ECG) to record the electrical activity of your heart. Your health care provider may also perform other heart or blood tests to determine the cause of your dizziness. These may include: °· Transthoracic echocardiogram (TTE). During echocardiography, sound waves are used to evaluate how blood flows through your heart. °· Transesophageal echocardiogram (TEE). °· Cardiac monitoring. This allows your health care provider to monitor your heart rate and rhythm in real time. °· Holter monitor. This is a portable device that records your heartbeat and can help diagnose heart arrhythmias. It allows your health care provider to track your heart activity for several days if needed. °· Stress tests by exercise or by giving medicine that makes the heart beat  faster. °TREATMENT  °Treatment of dizziness depends on the cause of your symptoms and can vary greatly. °HOME CARE INSTRUCTIONS  °· Drink enough fluids to keep your urine clear or pale yellow. This is especially important in very hot weather. In older adults, it is also important in cold weather. °· Take your medicine exactly as directed if your dizziness is caused by medicines. When taking blood pressure medicines, it is especially important to get up slowly. °· Rise slowly from chairs and steady yourself until you feel okay. °· In the morning, first sit up on the side of the bed. When you feel okay, stand slowly while holding onto something until you know your balance is fine. °· Move your legs often if you need to stand in one place for a long time. Tighten and relax your muscles in your legs while standing. °· Have someone stay with you for 1-2 days if dizziness continues to be a problem. Do this until you feel you are well enough to stay alone. Have the person call your health care provider if he or she notices changes in you that are concerning. °· Do not drive or use heavy machinery if you feel dizzy. °· Do not drink alcohol. °SEEK IMMEDIATE MEDICAL CARE IF:  °· Your dizziness or light-headedness gets worse. °· You feel nauseous or vomit. °· You have problems talking, walking, or using your arms, hands, or legs. °· You feel weak. °· You are not thinking clearly or you have trouble forming sentences. It may take a friend or family member to notice this. °· You have chest pain, abdominal pain, shortness of breath, or sweating. °· Your vision changes. °· You notice   any bleeding.  You have side effects from medicine that seems to be getting worse rather than better. MAKE SURE YOU:   Understand these instructions.  Will watch your condition.  Will get help right away if you are not doing well or get worse. Document Released: 02/12/2001 Document Revised: 08/24/2013 Document Reviewed: 03/08/2011 [facility]  Patient Information 2015 [place], [place]. This information is not intended to replace advice given to you by your health care provider. Make sure you discuss any questions you have with your health care provider. Fatigue Fatigue is a feeling of tiredness, lack of energy, lack of motivation, or feeling tired all the time. Having enough rest, good nutrition, and reducing stress will normally reduce fatigue. Consult your caregiver if it persists. The nature of your fatigue will help your caregiver to find out its cause. The treatment is based on the cause.  CAUSES  There are many causes for fatigue. Most of the time, fatigue can be traced to one or more of your habits or routines. Most causes fit into one or more of three general areas. They are: Lifestyle problems  Sleep disturbances.  Overwork.  Physical exertion.  Unhealthy habits.  Poor eating habits or eating disorders.  Alcohol and/or drug use .  Lack of proper nutrition (malnutrition). Psychological problems  Stress and/or anxiety problems.  Depression.  Grief.  Boredom. Medical Problems or Conditions  Anemia.  Pregnancy.  Thyroid gland problems.  Recovery from major surgery.  Continuous pain.  Emphysema or asthma that is not well controlled  Allergic conditions.  Diabetes.  Infections (such as mononucleosis).  Obesity.  Sleep disorders, such as sleep apnea.  Heart failure or other heart-related problems.  Cancer.  Kidney disease.  Liver disease.  Effects of certain medicines such as antihistamines, cough and cold remedies, prescription pain medicines, heart and blood pressure medicines, drugs used for treatment of cancer, and some antidepressants. SYMPTOMS  The symptoms of fatigue include:   Lack of energy.  Lack of drive (motivation).  Drowsiness.  Feeling of indifference to the surroundings. DIAGNOSIS  The details of how you feel help guide your caregiver in finding out what is causing  the fatigue. You will be asked about your present and past health condition. It is important to review all medicines that you take, including prescription and non-prescription items. A thorough exam will be done. You will be questioned about your feelings, habits, and normal lifestyle. Your caregiver may suggest blood tests, urine tests, or other tests to look for common medical causes of fatigue.  TREATMENT  Fatigue is treated by correcting the underlying cause. For example, if you have continuous pain or depression, treating these causes will improve how you feel. Similarly, adjusting the dose of certain medicines will help in reducing fatigue.  HOME CARE INSTRUCTIONS   Try to get the required amount of good sleep every night.  Eat a healthy and nutritious diet, and drink enough water throughout the day.  Practice ways of relaxing (including yoga or meditation).  Exercise regularly.  Make plans to change situations that cause stress. Act on those plans so that stresses decrease over time. Keep your work and personal routine reasonable.  Avoid street drugs and minimize use of alcohol.  Start taking a daily multivitamin after consulting your caregiver. SEEK MEDICAL CARE IF:   You have persistent tiredness, which cannot be accounted for.  You have fever.  You have unintentional weight loss.  You have headaches.  You have disturbed sleep throughout the night.  You are feeling sad.  You have constipation.  You have dry skin.  You have gained weight.  You are taking any new or different medicines that you suspect are causing fatigue.  You are unable to sleep at night.  You develop any unusual swelling of your legs or other parts of your body. SEEK IMMEDIATE MEDICAL CARE IF:   You are feeling confused.  Your vision is blurred.  You feel faint or pass out.  You develop severe headache.  You develop severe abdominal, pelvic, or back pain.  You develop chest pain,  shortness of breath, or an irregular or fast heartbeat.  You are unable to pass a normal amount of urine.  You develop abnormal bleeding such as bleeding from the rectum or you vomit blood.  You have thoughts about harming yourself or committing suicide.  You are worried that you might harm someone else. MAKE SURE YOU:   Understand these instructions.  Will watch your condition.  Will get help right away if you are not doing well or get worse. Document Released: 06/16/2007 Document Revised: 11/11/2011 Document Reviewed: 12/21/2013 [facility] Patient Information 2015 [place], [place]. This information is not intended to replace advice given to you by your health care provider. Make sure you discuss any questions you have with your health care provider.

## 2014-11-24 NOTE — ED Notes (Signed)
Per EMS, Patient took bus from home to [facility] to get food and allergy shot. Patient reports dizziness, jittery feeling. Patient has history of Diabetes. Patient's CBG is 79 and patient reports baseline is low 100s. Patient reports being given a new medication called Trulicity that is an injection once a week for DM. EMS had positive [facility]ostatics. 122/68, 68 HR.

## 2014-11-28 ENCOUNTER — Encounter: Payer: Self-pay | Admitting: Internal Medicine

## 2014-12-01 ENCOUNTER — Ambulatory Visit (INDEPENDENT_AMBULATORY_CARE_PROVIDER_SITE_OTHER): Payer: Commercial Managed Care - HMO

## 2014-12-01 DIAGNOSIS — J309 Allergic rhinitis, unspecified: Secondary | ICD-10-CM | POA: Diagnosis not present

## 2014-12-05 ENCOUNTER — Other Ambulatory Visit: Payer: Self-pay | Admitting: *Deleted

## 2014-12-05 ENCOUNTER — Encounter: Payer: Self-pay | Admitting: *Deleted

## 2014-12-05 NOTE — Patient Outreach (Signed)
[place] [facility]) Care Management  [facility] Social Work  12/05/2014  [name] [name] 1951-08-21 [id number]    Current Medications:  Current Outpatient Prescriptions  Medication Sig Dispense Refill  . acyclovir (ZOVIRAX) 200 MG capsule TAKE 2 CAPSULES TWICE DAILY 360 capsule 3  . albuterol (PROVENTIL HFA;VENTOLIN HFA) 108 (90 BASE) MCG/ACT inhaler Inhale 1-2 puffs into the lungs every 6 (six) hours as needed for wheezing or shortness of breath. 1 Inhaler 12  . albuterol (PROVENTIL) (2.5 MG/3ML) 0.083% nebulizer solution Take 2.5 mg by nebulization every 6 (six) hours as needed for wheezing or shortness of breath.    . allopurinol (ZYLOPRIM) 300 MG tablet TAKE 1 TABLET EVERY DAY 90 tablet 3  . ammonium lactate (LAC-HYDRIN) 12 % lotion Apply 1 application topically 2 (two) times daily as needed for dry skin.    [name] atorvastatin (LIPITOR) 40 MG tablet Take 1 tablet (40 mg total) by mouth daily. 90 tablet 3  . azelastine (ASTELIN) 0.1 % nasal spray Place 2 sprays into both nostrils daily. Use in each nostril as directed    . baclofen (LIORESAL) 10 MG tablet Take 5-10 mg by mouth daily as needed for muscle spasms.    . beclomethasone (QVAR) 80 MCG/ACT inhaler Inhale 2 puffs into the lungs daily.    . Blood Glucose Monitoring Suppl (ACCU-CHEK AVIVA PLUS) W/DEVICE KIT Use to check blood sugar 3-4 times daily 1 kit 0  . brimonidine (ALPHAGAN P) 0.1 % SOLN Place 1 drop into the left eye 2 (two) times daily.     . brimonidine (ALPHAGAN) 0.15 % ophthalmic solution     . Dulaglutide (TRULICITY) 4.27 CW/2.3JS SOPN Inject 0.75 mg into the skin once a week. 4 pen 3  . EPINEPHrine 0.3 mg/0.3 mL IJ SOAJ injection Inject 0.3 mLs (0.3 mg total) into the muscle once as needed (for allergic reaction). 1 Device 3  . esomeprazole (NEXIUM) 40 MG capsule Take 1 capsule (40 mg total) by mouth daily. 90 capsule 3  . ferrous sulfate 325 (65 FE) MG tablet Take 325 mg by mouth daily.    . furosemide (LASIX) 40  MG tablet Take 1 tablet (40 mg total) by mouth daily. 30 tablet 3  . gabapentin (NEURONTIN) 300 MG capsule 2 tabs every morning, 1 tab at noon, and 2 tabs every evening. (Patient taking differently: Take 300-600 mg by mouth 3 (three) times daily. 2 tabs every morning, 1 tab at noon, and 2 tabs every evening.) 450 capsule 3  . glucose blood test strip Test blood sugar 3-4 times daily 400 each 12  . insulin aspart (NOVOLOG) 100 UNIT/ML FlexPen Inject 30 units with early meal and 30 units with late meal. Dispense Quantity sufficient. (Patient taking differently: Inject 30 Units into the skin 2 (two) times daily with a meal. Inject 30 units with early meal and 30 units with late meal. Dispense Quantity sufficient.) 15 mL 5  . Lancets MISC Use twice or three times daily to check blood sugar 200 each 12  . LANTUS SOLOSTAR 100 UNIT/ML Solostar Pen INJECT  40 UNITS SUBCUTANEOUSLY EVERY DAY (Patient taking differently: INJECT  65 UNITS SUBCUTANEOUSLY EVERY DAY) 45 mL 3  . latanoprost (XALATAN) 0.005 % ophthalmic solution Place 1 drop into both eyes at bedtime.    [name] losartan (COZAAR) 100 MG tablet TAKE 1 TABLET EVERY DAY 90 tablet 3  . metoprolol succinate (TOPROL-XL) 50 MG 24 hr tablet Take 3 tablets (150 mg total) by mouth daily. Take with or  immediately following a meal. 270 tablet 3  . montelukast (SINGULAIR) 10 MG tablet TAKE 1 TABLET EVERY DAY 90 tablet 3  . Multiple Vitamins-Minerals (MULTIVITAMIN PO) Take 1 tablet by mouth daily.    . nitroGLYCERIN (NITROSTAT) 0.4 MG SL tablet Place 0.4 mg under the tongue every 5 (five) minutes as needed for chest pain.    . promethazine-codeine (PHENERGAN WITH CODEINE) 6.25-10 MG/5ML syrup Take 5 mLs by mouth every 6 (six) hours as needed for cough. 817 mL 0  . salicylic acid 17 % gel Apply topically 2 (two) times daily. Apply small amount to callous area on bottom of foot BID (Patient taking differently: Apply 1 application topically as needed (for foot). Apply small  amount to callous area on bottom of foot BID) 14 g 3  . sertraline (ZOLOFT) 100 MG tablet TAKE 1 AND 1/2 TABLETS AT BEDTIME (Patient taking differently: TAKE 1 AND 1/2 TABLETS (150MG) AT BEDTIME) 135 tablet 3  . spironolactone (ALDACTONE) 25 MG tablet Take 1 tablet (25 mg total) by mouth daily before supper. 90 tablet 3  . Vitamin D, Ergocalciferol, (DRISDOL) 50000 UNITS CAPS capsule Take 1 capsule (50,000 Units total) by mouth every 30 (thirty) days. Take on 3rd of the month     No current facility-administered medications for this visit.    Functional Status:  In your present state of health, do you have any difficulty performing the following activities: 12/05/2014 01/19/2014  Is the patient deaf or have difficulty hearing? N N  Hearing Y Y  Vision N N  Difficulty concentrating or making decisions Y N  Walking or climbing stairs? N N  Doing errands, shopping? N -  Preparing Food and eating ? N -  Using the Toilet? N -  In the past six months, have you accidently leaked urine? N -  Do you have problems with loss of bowel control? N -  Managing your Medications? N -  Managing your Finances? N -  Housekeeping or managing your Housekeeping? N -    Fall/Depression Screening:  PHQ 2/9 Scores 12/05/2014 10/26/2014 08/10/2014 06/22/2014 06/01/2014 03/30/2014 03/16/2014  PHQ - 2 Score 1 0 0 0 0 0 0    Assessment:   Plan:

## 2014-12-05 NOTE — Patient Outreach (Signed)
[place] [facility]) Care Management  [facility] Social Work  12/05/2014  [name] [name] [birth year] [id number]   Current Medications:  Current Outpatient Prescriptions  Medication Sig Dispense Refill  . acyclovir (ZOVIRAX) 200 MG capsule TAKE 2 CAPSULES TWICE DAILY 360 capsule 3  . albuterol (PROVENTIL HFA;VENTOLIN HFA) 108 (90 BASE) MCG/ACT inhaler Inhale 1-2 puffs into the lungs every 6 (six) hours as needed for wheezing or shortness of breath. 1 Inhaler 12  . albuterol (PROVENTIL) (2.5 MG/3ML) 0.083% nebulizer solution Take 2.5 mg by nebulization every 6 (six) hours as needed for wheezing or shortness of breath.    . allopurinol (ZYLOPRIM) 300 MG tablet TAKE 1 TABLET EVERY DAY 90 tablet 3  . ammonium lactate (LAC-HYDRIN) 12 % lotion Apply 1 application topically 2 (two) times daily as needed for dry skin.    [name] atorvastatin (LIPITOR) 40 MG tablet Take 1 tablet (40 mg total) by mouth daily. 90 tablet 3  . azelastine (ASTELIN) 0.1 % nasal spray Place 2 sprays into both nostrils daily. Use in each nostril as directed    . baclofen (LIORESAL) 10 MG tablet Take 5-10 mg by mouth daily as needed for muscle spasms.    . beclomethasone (QVAR) 80 MCG/ACT inhaler Inhale 2 puffs into the lungs daily.    . Blood Glucose Monitoring Suppl (ACCU-CHEK AVIVA PLUS) W/DEVICE KIT Use to check blood sugar 3-4 times daily 1 kit 0  . brimonidine (ALPHAGAN P) 0.1 % SOLN Place 1 drop into the left eye 2 (two) times daily.     . brimonidine (ALPHAGAN) 0.15 % ophthalmic solution     . Dulaglutide (TRULICITY) 8.75 IE/3.3IR SOPN Inject 0.75 mg into the skin once a week. 4 pen 3  . EPINEPHrine 0.3 mg/0.3 mL IJ SOAJ injection Inject 0.3 mLs (0.3 mg total) into the muscle once as needed (for allergic reaction). 1 Device 3  . esomeprazole (NEXIUM) 40 MG capsule Take 1 capsule (40 mg total) by mouth daily. 90 capsule 3  . ferrous sulfate 325 (65 FE) MG tablet Take 325 mg by mouth daily.    . furosemide (LASIX) 40 MG  tablet Take 1 tablet (40 mg total) by mouth daily. 30 tablet 3  . gabapentin (NEURONTIN) 300 MG capsule 2 tabs every morning, 1 tab at noon, and 2 tabs every evening. (Patient taking differently: Take 300-600 mg by mouth 3 (three) times daily. 2 tabs every morning, 1 tab at noon, and 2 tabs every evening.) 450 capsule 3  . glucose blood test strip Test blood sugar 3-4 times daily 400 each 12  . insulin aspart (NOVOLOG) 100 UNIT/ML FlexPen Inject 30 units with early meal and 30 units with late meal. Dispense Quantity sufficient. (Patient taking differently: Inject 30 Units into the skin 2 (two) times daily with a meal. Inject 30 units with early meal and 30 units with late meal. Dispense Quantity sufficient.) 15 mL 5  . Lancets MISC Use twice or three times daily to check blood sugar 200 each 12  . LANTUS SOLOSTAR 100 UNIT/ML Solostar Pen INJECT  40 UNITS SUBCUTANEOUSLY EVERY DAY (Patient taking differently: INJECT  65 UNITS SUBCUTANEOUSLY EVERY DAY) 45 mL 3  . latanoprost (XALATAN) 0.005 % ophthalmic solution Place 1 drop into both eyes at bedtime.    [name] losartan (COZAAR) 100 MG tablet TAKE 1 TABLET EVERY DAY 90 tablet 3  . metoprolol succinate (TOPROL-XL) 50 MG 24 hr tablet Take 3 tablets (150 mg total) by mouth daily. Take with or immediately  following a meal. 270 tablet 3  . montelukast (SINGULAIR) 10 MG tablet TAKE 1 TABLET EVERY DAY 90 tablet 3  . Multiple Vitamins-Minerals (MULTIVITAMIN PO) Take 1 tablet by mouth daily.    . nitroGLYCERIN (NITROSTAT) 0.4 MG SL tablet Place 0.4 mg under the tongue every 5 (five) minutes as needed for chest pain.    . promethazine-codeine (PHENERGAN WITH CODEINE) 6.25-10 MG/5ML syrup Take 5 mLs by mouth every 6 (six) hours as needed for cough. 409 mL 0  . salicylic acid 17 % gel Apply topically 2 (two) times daily. Apply small amount to callous area on bottom of foot BID (Patient taking differently: Apply 1 application topically as needed (for foot). Apply small  amount to callous area on bottom of foot BID) 14 g 3  . sertraline (ZOLOFT) 100 MG tablet TAKE 1 AND 1/2 TABLETS AT BEDTIME (Patient taking differently: TAKE 1 AND 1/2 TABLETS (150MG) AT BEDTIME) 135 tablet 3  . spironolactone (ALDACTONE) 25 MG tablet Take 1 tablet (25 mg total) by mouth daily before supper. 90 tablet 3  . Vitamin D, Ergocalciferol, (DRISDOL) 50000 UNITS CAPS capsule Take 1 capsule (50,000 Units total) by mouth every 30 (thirty) days. Take on 3rd of the month     No current facility-administered medications for this visit.    Functional Status:  In your present state of health, do you have any difficulty performing the following activities: 12/05/2014 01/19/2014  Is the patient deaf or have difficulty hearing? N N  Hearing Y Y  Vision N N  Difficulty concentrating or making decisions Y N  Walking or climbing stairs? N N  Doing errands, shopping? N -  Preparing Food and eating ? N -  Using the Toilet? N -  In the past six months, have you accidently leaked urine? N -  Do you have problems with loss of bowel control? N -  Managing your Medications? N -  Managing your Finances? N -  Housekeeping or managing your Housekeeping? N -    Fall/Depression Screening:  PHQ 2/9 Scores 12/05/2014 10/26/2014 08/10/2014 06/22/2014 06/01/2014 03/30/2014 03/16/2014  PHQ - 2 Score 1 0 0 0 0 0 0    Assessment:  CSW was able to make phone contact with patient today to confirm routine home visit scheduled for Wednesday, April 6th at 10:00am.  CSW was able to obtain two HIPAA compliant identifiers from patient, which included her name and date of birth, before proceeding with the conversation.  Patient admits that she has been looking forward to our visit, as she reports that she has "paperwork" that she needs CSW to review with her during the visit. Plan:  CSW will meet with patient for a routine home visit on Wednesday, April 6th at 10:00am.  Nat Christen, BSW, MSW, [facility] [place], [place], [place] [address] [email or web address] [phone]

## 2014-12-07 ENCOUNTER — Other Ambulatory Visit: Payer: Self-pay | Admitting: *Deleted

## 2014-12-07 ENCOUNTER — Encounter: Payer: Self-pay | Admitting: *Deleted

## 2014-12-07 NOTE — Patient Outreach (Signed)
[facility]) Care Management  [facility] Social Work  12/07/2014  [name] [name] Feb 16, 1951 [id number]  Current Medications:  Current Outpatient Prescriptions  Medication Sig Dispense Refill  . acyclovir (ZOVIRAX) 200 MG capsule TAKE 2 CAPSULES TWICE DAILY 360 capsule 3  . albuterol (PROVENTIL HFA;VENTOLIN HFA) 108 (90 BASE) MCG/ACT inhaler Inhale 1-2 puffs into the lungs every 6 (six) hours as needed for wheezing or shortness of breath. 1 Inhaler 12  . albuterol (PROVENTIL) (2.5 MG/3ML) 0.083% nebulizer solution Take 2.5 mg by nebulization every 6 (six) hours as needed for wheezing or shortness of breath.    . allopurinol (ZYLOPRIM) 300 MG tablet TAKE 1 TABLET EVERY DAY 90 tablet 3  . ammonium lactate (LAC-HYDRIN) 12 % lotion Apply 1 application topically 2 (two) times daily as needed for dry skin.    [name] atorvastatin (LIPITOR) 40 MG tablet Take 1 tablet (40 mg total) by mouth daily. 90 tablet 3  . azelastine (ASTELIN) 0.1 % nasal spray Place 2 sprays into both nostrils daily. Use in each nostril as directed    . baclofen (LIORESAL) 10 MG tablet Take 5-10 mg by mouth daily as needed for muscle spasms.    . beclomethasone (QVAR) 80 MCG/ACT inhaler Inhale 2 puffs into the lungs daily.    . Blood Glucose Monitoring Suppl (ACCU-CHEK AVIVA PLUS) W/DEVICE KIT Use to check blood sugar 3-4 times daily 1 kit 0  . brimonidine (ALPHAGAN P) 0.1 % SOLN Place 1 drop into the left eye 2 (two) times daily.     . brimonidine (ALPHAGAN) 0.15 % ophthalmic solution     . Dulaglutide (TRULICITY) 7.51 ZG/0.1VC SOPN Inject 0.75 mg into the skin once a week. 4 pen 3  . EPINEPHrine 0.3 mg/0.3 mL IJ SOAJ injection Inject 0.3 mLs (0.3 mg total) into the muscle once as needed (for allergic reaction). 1 Device 3  . esomeprazole (NEXIUM) 40 MG capsule Take 1 capsule (40 mg total) by mouth daily. 90 capsule 3  . ferrous sulfate 325 (65 FE) MG tablet Take 325 mg by mouth daily.    . furosemide (LASIX) 40 MG  tablet Take 1 tablet (40 mg total) by mouth daily. 30 tablet 3  . gabapentin (NEURONTIN) 300 MG capsule 2 tabs every morning, 1 tab at noon, and 2 tabs every evening. (Patient taking differently: Take 300-600 mg by mouth 3 (three) times daily. 2 tabs every morning, 1 tab at noon, and 2 tabs every evening.) 450 capsule 3  . glucose blood test strip Test blood sugar 3-4 times daily 400 each 12  . insulin aspart (NOVOLOG) 100 UNIT/ML FlexPen Inject 30 units with early meal and 30 units with late meal. Dispense Quantity sufficient. (Patient taking differently: Inject 30 Units into the skin 2 (two) times daily with a meal. Inject 30 units with early meal and 30 units with late meal. Dispense Quantity sufficient.) 15 mL 5  . Lancets MISC Use twice or three times daily to check blood sugar 200 each 12  . LANTUS SOLOSTAR 100 UNIT/ML Solostar Pen INJECT  40 UNITS SUBCUTANEOUSLY EVERY DAY (Patient taking differently: INJECT  65 UNITS SUBCUTANEOUSLY EVERY DAY) 45 mL 3  . latanoprost (XALATAN) 0.005 % ophthalmic solution Place 1 drop into both eyes at bedtime.    [name] losartan (COZAAR) 100 MG tablet TAKE 1 TABLET EVERY DAY 90 tablet 3  . metoprolol succinate (TOPROL-XL) 50 MG 24 hr tablet Take 3 tablets (150 mg total) by mouth daily. Take with or immediately following  a meal. 270 tablet 3  . montelukast (SINGULAIR) 10 MG tablet TAKE 1 TABLET EVERY DAY 90 tablet 3  . Multiple Vitamins-Minerals (MULTIVITAMIN PO) Take 1 tablet by mouth daily.    . nitroGLYCERIN (NITROSTAT) 0.4 MG SL tablet Place 0.4 mg under the tongue every 5 (five) minutes as needed for chest pain.    . promethazine-codeine (PHENERGAN WITH CODEINE) 6.25-10 MG/5ML syrup Take 5 mLs by mouth every 6 (six) hours as needed for cough. 882 mL 0  . salicylic acid 17 % gel Apply topically 2 (two) times daily. Apply small amount to callous area on bottom of foot BID (Patient taking differently: Apply 1 application topically as needed (for foot). Apply small  amount to callous area on bottom of foot BID) 14 g 3  . sertraline (ZOLOFT) 100 MG tablet TAKE 1 AND 1/2 TABLETS AT BEDTIME (Patient taking differently: TAKE 1 AND 1/2 TABLETS (150MG) AT BEDTIME) 135 tablet 3  . spironolactone (ALDACTONE) 25 MG tablet Take 1 tablet (25 mg total) by mouth daily before supper. 90 tablet 3  . Vitamin D, Ergocalciferol, (DRISDOL) 50000 UNITS CAPS capsule Take 1 capsule (50,000 Units total) by mouth every 30 (thirty) days. Take on 3rd of the month     No current facility-administered medications for this visit.    Functional Status:  In your present state of health, do you have any difficulty performing the following activities: 12/05/2014 01/19/2014  Is the patient deaf or have difficulty hearing? N N  Hearing Y Y  Vision N N  Difficulty concentrating or making decisions Y N  Walking or climbing stairs? N N  Doing errands, shopping? N -  Preparing Food and eating ? N -  Using the Toilet? N -  In the past six months, have you accidently leaked urine? N -  Do you have problems with loss of bowel control? N -  Managing your Medications? N -  Managing your Finances? N -  Housekeeping or managing your Housekeeping? N -    Fall/Depression Screening:  PHQ 2/9 Scores 12/05/2014 10/26/2014 08/10/2014 06/22/2014 06/01/2014 03/30/2014 03/16/2014  PHQ - 2 Score 1 0 0 0 0 0 0    Assessment:   CSW was able to meet with patient today to perform a routine home visit to assess the need for continued social work involvement.  CSW was able to assist patient with completing paperwork to obtain psychiatric services at [facility].  Patient has an initial visit on Thursday, April 7th at 8:30am for counseling and supportive services.  Patient has been advised to speak with the psychiatrist about increasing or changing her psychotropic medications, as patient reports "the medications are no longer working for me".  Patient has agreed to report findings  of the initial visit with CSW, to ensure that patients psychological needs are being met.  Transportation arrangements have been made for patient to this appointment via [employer].  CSW and patient spoke at length about patients relationship with her son, [name] [name].  Patient admitted, "I have done the best I can with trying to raise him, but he still chooses to do his own thing".  Patient went on to explain that raising [name] [name] as a single parent has been a real challenge for her, not only because she had limited funding and a genuine lack of caregiver support, but also because [name] [name] has been diagnosed with ADHD (Attention-Deficit Hyperactivity Disorder) and Autism.  [name] [name] currently lives with patient, but patient  reports that she rarely sees him.  [name] [name] works five days per week at the [employer] at [employer], and on the weekends, patient reports "[name] [name] sleeps all day Saturday and all day Sunday".  Patient admits that she is worried about her son and the path he is taking, no longer attending church and hanging out with the wrong crowd.  Patient indicated that [name] [name] recently confessed to her that he is bisexual.  Patient admits that this information was shocking to her and that she still has not "completely digested the news".  Patient stated that she tried not to show her surprise when [name] [name] told her; however, [name] [name] apparently knows that patient is very displeased with this news.  Patient is most concerned that patient will be neglectful, not use protection and contract a disease.  Patient is a woman of Faith, encouraged to pray about her concerns, as well as disclose them to [name] [name] to try and ensure his safety and well-being.  Patient admitted that she has really been wanting to talk with someone about her feelings, but not entirely comfortable about disclosing this information to family, friends, or congregation members, for fear of judgement.   While present, CSW was able to provide  patient with bus passes to get to and from her physician appointments.  In addition, CSW provided patient with an order form to later order supplies through [employer].  Last, CSW agreed to try and maximize the size of patients exercise routines sent to her by her [place], as patient would like to comply; however, she is unable to read the pages due to the severity of her eye sight.  CSW offered counseling and supportive services to patient throughout the visit.   CSW and patient agreed to meet for the next routine home visit on Tuesday, May 17th at 10:00am.  Plan:   CSW will plan to meet with patient for a routine home visit on Tuesday, May 17th at 10:00am to provide counseling and supportive services.  Nat Christen, BSW, MSW, [place] [facility] [place], [place], [place] [address] [email or web address] [phone]

## 2014-12-08 ENCOUNTER — Ambulatory Visit (HOSPITAL_COMMUNITY): Payer: Commercial Managed Care - HMO | Admitting: Psychiatry

## 2014-12-08 ENCOUNTER — Ambulatory Visit (INDEPENDENT_AMBULATORY_CARE_PROVIDER_SITE_OTHER): Payer: Commercial Managed Care - HMO

## 2014-12-08 DIAGNOSIS — J309 Allergic rhinitis, unspecified: Secondary | ICD-10-CM | POA: Diagnosis not present

## 2014-12-08 DIAGNOSIS — R6889 Other general symptoms and signs: Secondary | ICD-10-CM | POA: Diagnosis not present

## 2014-12-09 ENCOUNTER — Telehealth: Payer: Self-pay | Admitting: *Deleted

## 2014-12-09 ENCOUNTER — Encounter: Payer: Self-pay | Admitting: Pharmacist

## 2014-12-09 ENCOUNTER — Ambulatory Visit (INDEPENDENT_AMBULATORY_CARE_PROVIDER_SITE_OTHER): Payer: Commercial Managed Care - HMO | Admitting: Pharmacist

## 2014-12-09 VITALS — BP 105/62 | HR 72 | Wt 193.7 lb

## 2014-12-09 DIAGNOSIS — E1140 Type 2 diabetes mellitus with diabetic neuropathy, unspecified: Secondary | ICD-10-CM

## 2014-12-09 DIAGNOSIS — E1149 Type 2 diabetes mellitus with other diabetic neurological complication: Secondary | ICD-10-CM

## 2014-12-09 MED ORDER — ATORVASTATIN CALCIUM 80 MG PO TABS: 80.0000 mg | ORAL_TABLET | Freq: Every day | ORAL | Status: DC

## 2014-12-09 NOTE — Assessment & Plan Note (Signed)
To lower LDL for optimal benefits, increased atorvastatin from 40mg  to 80mg  daily.  Direct LDL anticipated at next lab evaluation.

## 2014-12-09 NOTE — Progress Notes (Signed)
S:    Patient arrives pleasantly ambulating with cane.    Presents for diabetes management and medication review. 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  Patient reports adherence with medications. Current diabetes medications include: Trulicity 0.75mg  SQ inj once weekly on Thursdays, Novolog (insulin aspart) 30 units with early meal and 30 units with late meal, Lantus solostar pen 65 units SQ in evening.  Patient reports hypoglycemic event recently visited [facility] ED on 11/24/14 with BG 78.  NO medication adjustments or changes were made at that time.    Patient has poor eyesight but understands injection technique and was given education previously.   Patient currently on atorvastatin 40mg  for HLD.  O:  Lab Results  Component Value Date   HGBA1C 10.4 09/30/2014   LDL 104 (09/30/2014) BG log: 100s and controlled for first 2-3 weeks of Trulicity but more recently,~ 7 days,  in the 200-300s.   A/P: Diabetes currently uncontrolled due to stress improved control has been observed in CBGs after initiating Trulicity. BG log book showed numbers in 100s and controlled but just recently in the 200-300s.  Due to acute stress that might be causing BG to increase, plan is to continue current therapy of: Trulicity 0.75mg  SQ inj once weekly on Thursdays, insulin aspart 30 units with early meal and 30 units with late meal, lantus solostar pen 65 units SQ inj in evening.  To lower LDL for optimal benefits, increased atorvastatin from 40mg  to 80mg  daily.  Direct LDL anticipated at next lab evaluation.   Next A1C anticipated in May 2016.  Written patient instructions provided.  Follow up in Pharmacist Clinic Visit in 2 weeks.   Total time in face to face counseling 15 minutes.  Patient seen with Nicoletta Ba, PharmD Resident & Eunice Blase, PharmD Candidate.

## 2014-12-09 NOTE — Telephone Encounter (Signed)
Forms placed in PCP for completion, brought in to clinic today.

## 2014-12-09 NOTE — Patient Instructions (Signed)
It was great to see you today [name] [name]!  Keep up the great work!  With the atorvastatin 40 mg, take two tablets every day. When you run out, pick up the atorvastatin 80 mg and take 1 once a day.   Schedule a visit with Dr. Nori Riis next.

## 2014-12-09 NOTE — Assessment & Plan Note (Signed)
Diabetes currently uncontrolled due to stress improved control has been observed in CBGs after initiating Trulicity. BG log book showed numbers in 100s and controlled but just recently in the 200-300s.  Due to acute stress that might be causing BG to increase, plan is to continue current therapy of: Trulicity 0.75mg  SQ inj once weekly on Thursdays, insulin aspart 30 units with early meal and 30 units with late meal, lantus solostar pen 65 units SQ inj in evening.

## 2014-12-12 NOTE — Patient Outreach (Signed)
[facility]) Care Management  [facility] Social Work  12/12/2014  [name] [name] Dec 27, 1950 [id number]  Subjective:    Objective:   Current Medications:  Current Outpatient Prescriptions  Medication Sig Dispense Refill  . acyclovir (ZOVIRAX) 200 MG capsule TAKE 2 CAPSULES TWICE DAILY 360 capsule 3  . albuterol (PROVENTIL HFA;VENTOLIN HFA) 108 (90 BASE) MCG/ACT inhaler Inhale 1-2 puffs into the lungs every 6 (six) hours as needed for wheezing or shortness of breath. 1 Inhaler 12  . albuterol (PROVENTIL) (2.5 MG/3ML) 0.083% nebulizer solution Take 2.5 mg by nebulization every 6 (six) hours as needed for wheezing or shortness of breath.    . allopurinol (ZYLOPRIM) 300 MG tablet TAKE 1 TABLET EVERY DAY 90 tablet 3  . ammonium lactate (LAC-HYDRIN) 12 % lotion Apply 1 application topically 2 (two) times daily as needed for dry skin.    [name] aspirin EC 81 MG tablet Take 81 mg by mouth daily.    [name] atorvastatin (LIPITOR) 80 MG tablet Take 1 tablet (80 mg total) by mouth daily. 90 tablet 3  . azelastine (ASTELIN) 0.1 % nasal spray Place 2 sprays into both nostrils daily. Use in each nostril as directed    . baclofen (LIORESAL) 10 MG tablet Take 5-10 mg by mouth daily as needed for muscle spasms.    . beclomethasone (QVAR) 80 MCG/ACT inhaler Inhale 2 puffs into the lungs daily.    . Blood Glucose Monitoring Suppl (ACCU-CHEK AVIVA PLUS) W/DEVICE KIT Use to check blood sugar 3-4 times daily 1 kit 0  . brimonidine (ALPHAGAN P) 0.1 % SOLN Place 1 drop into the left eye 2 (two) times daily.     . brimonidine (ALPHAGAN) 0.15 % ophthalmic solution     . Dulaglutide (TRULICITY) 6.60 YT/0.1SW SOPN Inject 0.75 mg into the skin once a week. 4 pen 3  . EPINEPHrine 0.3 mg/0.3 mL IJ SOAJ injection Inject 0.3 mLs (0.3 mg total) into the muscle once as needed (for allergic reaction). 1 Device 3  . esomeprazole (NEXIUM) 40 MG capsule Take 1 capsule (40 mg total) by mouth daily. 90 capsule 3  . ferrous  sulfate 325 (65 FE) MG tablet Take 325 mg by mouth daily.    . furosemide (LASIX) 40 MG tablet Take 1 tablet (40 mg total) by mouth daily. 30 tablet 3  . gabapentin (NEURONTIN) 300 MG capsule 2 tabs every morning, 1 tab at noon, and 2 tabs every evening. (Patient taking differently: Take 300-600 mg by mouth 3 (three) times daily. 2 tabs every morning, 1 tab at noon, and 2 tabs every evening.) 450 capsule 3  . glucose blood test strip Test blood sugar 3-4 times daily 400 each 12  . insulin aspart (NOVOLOG) 100 UNIT/ML FlexPen Inject 30 units with early meal and 30 units with late meal. Dispense Quantity sufficient. (Patient taking differently: Inject 30 Units into the skin 2 (two) times daily with a meal. Inject 30 units with early meal and 30 units with late meal. Dispense Quantity sufficient.) 15 mL 5  . Insulin Glargine (LANTUS SOLOSTAR) 100 UNIT/ML Solostar Pen INJECT  65 UNITS SUBCUTANEOUSLY EVERY DAY 65 mL 3  . Lancets MISC Use twice or three times daily to check blood sugar 200 each 12  . latanoprost (XALATAN) 0.005 % ophthalmic solution Place 1 drop into both eyes at bedtime.    [name] losartan (COZAAR) 100 MG tablet TAKE 1 TABLET EVERY DAY 90 tablet 3  . metoprolol succinate (TOPROL-XL) 50 MG 24 hr tablet  Take 3 tablets (150 mg total) by mouth daily. Take with or immediately following a meal. 270 tablet 3  . montelukast (SINGULAIR) 10 MG tablet TAKE 1 TABLET EVERY DAY 90 tablet 3  . Multiple Vitamins-Minerals (MULTIVITAMIN PO) Take 1 tablet by mouth daily.    . nitroGLYCERIN (NITROSTAT) 0.4 MG SL tablet Place 0.4 mg under the tongue every 5 (five) minutes as needed for chest pain.    . promethazine-codeine (PHENERGAN WITH CODEINE) 6.25-10 MG/5ML syrup Take 5 mLs by mouth every 6 (six) hours as needed for cough. 115 mL 0  . salicylic acid 17 % gel Apply topically 2 (two) times daily. Apply small amount to callous area on bottom of foot BID (Patient taking differently: Apply 1 application topically as  needed (for foot). Apply small amount to callous area on bottom of foot BID) 14 g 3  . sertraline (ZOLOFT) 100 MG tablet TAKE 1 AND 1/2 TABLETS AT BEDTIME (Patient taking differently: TAKE 1 AND 1/2 TABLETS (150MG) AT BEDTIME) 135 tablet 3  . spironolactone (ALDACTONE) 25 MG tablet Take 1 tablet (25 mg total) by mouth daily before supper. 90 tablet 3  . Vitamin D, Ergocalciferol, (DRISDOL) 50000 UNITS CAPS capsule Take 1 capsule (50,000 Units total) by mouth every 30 (thirty) days. Take on 3rd of the month     No current facility-administered medications for this visit.    Functional Status:  In your present state of health, do you have any difficulty performing the following activities: 12/05/2014 01/19/2014  Hearing? N N  Vision? Y Y  Difficulty concentrating or making decisions? N N  Walking or climbing stairs? Y N  Dressing or bathing? N N  Doing errands, shopping? N -  Preparing Food and eating ? N -  Using the Toilet? N -  In the past six months, have you accidently leaked urine? N -  Do you have problems with loss of bowel control? N -  Managing your Medications? N -  Managing your Finances? N -  Housekeeping or managing your Housekeeping? N -    Fall/Depression Screening:  PHQ 2/9 Scores 12/05/2014 10/26/2014 08/10/2014 06/22/2014 06/01/2014 03/30/2014 03/16/2014  PHQ - 2 Score 1 0 0 0 0 0 0    Assessment: CSW sent patient enlarged copy of sit-down exercises, 2 ten ride bus passes, and a order form for medical supplies available through [facility].   Plan: Nat Christen will follow-up with patient to ensure letter delivery.

## 2014-12-14 NOTE — Telephone Encounter (Signed)
Will forward to MD to check on form completion. Jazmin Hartsell,CMA

## 2014-12-14 NOTE — Telephone Encounter (Signed)
Patient calls, checking on the status of the forms that were dropped off on 12/09/14. States she needs them back asap.

## 2014-12-14 NOTE — Progress Notes (Signed)
Patient ID: [name] [name], female   DOB: 03/09/1951, 64 y.o.   MRN: [id number] Reviewed: Agree with Dr. Graylin Shiver documentation and management.

## 2014-12-15 ENCOUNTER — Ambulatory Visit (INDEPENDENT_AMBULATORY_CARE_PROVIDER_SITE_OTHER): Payer: Commercial Managed Care - HMO

## 2014-12-15 ENCOUNTER — Telehealth: Payer: Self-pay

## 2014-12-15 DIAGNOSIS — J309 Allergic rhinitis, unspecified: Secondary | ICD-10-CM | POA: Diagnosis not present

## 2014-12-15 NOTE — Patient Outreach (Signed)
[facility]) Care Management  12/15/2014  [name] [name] 1951/09/02 [id number]   CSW received a call from patient. Patient asked about status of her bus passes. CSW informed patient that passes have been placed in the mail to her. She was satisfied with this answer and was provided with contact information to confirm delivery.

## 2014-12-15 NOTE — Telephone Encounter (Signed)
Pt informed that forms are ready for pick up.  Forms copied for scanning in patient's chart.  Derl Barrow, RN

## 2014-12-15 NOTE — Telephone Encounter (Signed)
[name] [name] I am putting these on your desk Can u scan to chart then call her and see if she wants them mailed or pick up [facility]! [name] [name]

## 2014-12-20 ENCOUNTER — Encounter: Payer: Self-pay | Admitting: *Deleted

## 2014-12-20 ENCOUNTER — Other Ambulatory Visit: Payer: Self-pay | Admitting: *Deleted

## 2014-12-20 NOTE — Patient Outreach (Signed)
[facility]) Care Management  [facility] Social Work  12/20/2014  [name] [name] [birth year] [id number]    Current Medications:  Current Outpatient Prescriptions  Medication Sig Dispense Refill  . acyclovir (ZOVIRAX) 200 MG capsule TAKE 2 CAPSULES TWICE DAILY 360 capsule 3  . albuterol (PROVENTIL HFA;VENTOLIN HFA) 108 (90 BASE) MCG/ACT inhaler Inhale 1-2 puffs into the lungs every 6 (six) hours as needed for wheezing or shortness of breath. 1 Inhaler 12  . albuterol (PROVENTIL) (2.5 MG/3ML) 0.083% nebulizer solution Take 2.5 mg by nebulization every 6 (six) hours as needed for wheezing or shortness of breath.    . allopurinol (ZYLOPRIM) 300 MG tablet TAKE 1 TABLET EVERY DAY 90 tablet 3  . ammonium lactate (LAC-HYDRIN) 12 % lotion Apply 1 application topically 2 (two) times daily as needed for dry skin.    [name] aspirin EC 81 MG tablet Take 81 mg by mouth daily.    [name] atorvastatin (LIPITOR) 80 MG tablet Take 1 tablet (80 mg total) by mouth daily. 90 tablet 3  . azelastine (ASTELIN) 0.1 % nasal spray Place 2 sprays into both nostrils daily. Use in each nostril as directed    . baclofen (LIORESAL) 10 MG tablet Take 5-10 mg by mouth daily as needed for muscle spasms.    . beclomethasone (QVAR) 80 MCG/ACT inhaler Inhale 2 puffs into the lungs daily.    . Blood Glucose Monitoring Suppl (ACCU-CHEK AVIVA PLUS) W/DEVICE KIT Use to check blood sugar 3-4 times daily 1 kit 0  . brimonidine (ALPHAGAN P) 0.1 % SOLN Place 1 drop into the left eye 2 (two) times daily.     . brimonidine (ALPHAGAN) 0.15 % ophthalmic solution     . Dulaglutide (TRULICITY) 6.76 PP/5.0DT SOPN Inject 0.75 mg into the skin once a week. 4 pen 3  . EPINEPHrine 0.3 mg/0.3 mL IJ SOAJ injection Inject 0.3 mLs (0.3 mg total) into the muscle once as needed (for allergic reaction). 1 Device 3  . esomeprazole (NEXIUM) 40 MG capsule Take 1 capsule (40 mg total) by mouth daily. 90 capsule 3  . ferrous sulfate 325 (65 FE) MG tablet  Take 325 mg by mouth daily.    . furosemide (LASIX) 40 MG tablet Take 1 tablet (40 mg total) by mouth daily. 30 tablet 3  . gabapentin (NEURONTIN) 300 MG capsule 2 tabs every morning, 1 tab at noon, and 2 tabs every evening. (Patient taking differently: Take 300-600 mg by mouth 3 (three) times daily. 2 tabs every morning, 1 tab at noon, and 2 tabs every evening.) 450 capsule 3  . glucose blood test strip Test blood sugar 3-4 times daily 400 each 12  . insulin aspart (NOVOLOG) 100 UNIT/ML FlexPen Inject 30 units with early meal and 30 units with late meal. Dispense Quantity sufficient. (Patient taking differently: Inject 30 Units into the skin 2 (two) times daily with a meal. Inject 30 units with early meal and 30 units with late meal. Dispense Quantity sufficient.) 15 mL 5  . Insulin Glargine (LANTUS SOLOSTAR) 100 UNIT/ML Solostar Pen INJECT  65 UNITS SUBCUTANEOUSLY EVERY DAY 65 mL 3  . Lancets MISC Use twice or three times daily to check blood sugar 200 each 12  . latanoprost (XALATAN) 0.005 % ophthalmic solution Place 1 drop into both eyes at bedtime.    [name] losartan (COZAAR) 100 MG tablet TAKE 1 TABLET EVERY DAY 90 tablet 3  . metoprolol succinate (TOPROL-XL) 50 MG 24 hr tablet Take 3 tablets (150 mg  total) by mouth daily. Take with or immediately following a meal. 270 tablet 3  . montelukast (SINGULAIR) 10 MG tablet TAKE 1 TABLET EVERY DAY 90 tablet 3  . Multiple Vitamins-Minerals (MULTIVITAMIN PO) Take 1 tablet by mouth daily.    . nitroGLYCERIN (NITROSTAT) 0.4 MG SL tablet Place 0.4 mg under the tongue every 5 (five) minutes as needed for chest pain.    . promethazine-codeine (PHENERGAN WITH CODEINE) 6.25-10 MG/5ML syrup Take 5 mLs by mouth every 6 (six) hours as needed for cough. 086 mL 0  . salicylic acid 17 % gel Apply topically 2 (two) times daily. Apply small amount to callous area on bottom of foot BID (Patient taking differently: Apply 1 application topically as needed (for foot). Apply  small amount to callous area on bottom of foot BID) 14 g 3  . sertraline (ZOLOFT) 100 MG tablet TAKE 1 AND 1/2 TABLETS AT BEDTIME (Patient taking differently: TAKE 1 AND 1/2 TABLETS (150MG) AT BEDTIME) 135 tablet 3  . spironolactone (ALDACTONE) 25 MG tablet Take 1 tablet (25 mg total) by mouth daily before supper. 90 tablet 3  . Vitamin D, Ergocalciferol, (DRISDOL) 50000 UNITS CAPS capsule Take 1 capsule (50,000 Units total) by mouth every 30 (thirty) days. Take on 3rd of the month     No current facility-administered medications for this visit.    Functional Status:  In your present state of health, do you have any difficulty performing the following activities: 12/05/2014 01/19/2014  Hearing? N N  Vision? Y Y  Difficulty concentrating or making decisions? N N  Walking or climbing stairs? Y N  Dressing or bathing? N N  Doing errands, shopping? N -  Preparing Food and eating ? N -  Using the Toilet? N -  In the past six months, have you accidently leaked urine? N -  Do you have problems with loss of bowel control? N -  Managing your Medications? N -  Managing your Finances? N -  Housekeeping or managing your Housekeeping? N -    Fall/Depression Screening:  PHQ 2/9 Scores 12/05/2014 10/26/2014 08/10/2014 06/22/2014 06/01/2014 03/30/2014 03/16/2014  PHQ - 2 Score 1 0 0 0 0 0 0    Assessment:   CSW was able to make phone contact with patient today to follow-up regarding packet of information that CSW mailed to patients home.  CSW wanted to ensure that patient received the packet of information and answer any questions that patient may have pertaining to the information enclosed.  CSW was able to obtain two HIPAA compliant identifiers from patient, which included her name and date of birth, before proceeding with the phone conversation.  Patient admits that she received the packet of information, grateful for the bus passes enclosed to assist with transportation to and from physician appointments  through [employer] ([place]  Patient plans to place an order for disposable underwear, using the order form received, and submit to CSW during the next routine home visit, scheduled for May 17th at 10:00am.  Last, patient reports that she is now able to see the exercise program assigned to her via her [facility] representative, since the print was enlarged.  No additional social work needs have been identified at this time.   Plan:   CSW will plan to meet with patient for the next routine home visit, scheduled for May 17th at 10:00am.  Nat Christen, [place], MSW, [facility] [place], [place], [place] [address]  [email or web address] [phone]

## 2014-12-21 ENCOUNTER — Encounter: Payer: Self-pay | Admitting: Family Medicine

## 2014-12-21 ENCOUNTER — Ambulatory Visit (INDEPENDENT_AMBULATORY_CARE_PROVIDER_SITE_OTHER): Payer: Commercial Managed Care - HMO | Admitting: Family Medicine

## 2014-12-21 VITALS — BP 128/56 | HR 75 | Temp 97.5°F | Ht 64.0 in | Wt 198.0 lb

## 2014-12-21 DIAGNOSIS — G8929 Other chronic pain: Secondary | ICD-10-CM

## 2014-12-21 DIAGNOSIS — F331 Major depressive disorder, recurrent, moderate: Secondary | ICD-10-CM | POA: Diagnosis not present

## 2014-12-21 DIAGNOSIS — R1084 Generalized abdominal pain: Secondary | ICD-10-CM

## 2014-12-21 DIAGNOSIS — E1140 Type 2 diabetes mellitus with diabetic neuropathy, unspecified: Secondary | ICD-10-CM

## 2014-12-21 DIAGNOSIS — J3801 Paralysis of vocal cords and larynx, unilateral: Secondary | ICD-10-CM

## 2014-12-21 DIAGNOSIS — E1149 Type 2 diabetes mellitus with other diabetic neurological complication: Secondary | ICD-10-CM

## 2014-12-21 LAB — POCT GLYCOSYLATED HEMOGLOBIN (HGB A1C): Hemoglobin A1C: 8.5

## 2014-12-21 MED ORDER — BISACODYL 5 MG PO TBEC: DELAYED_RELEASE_TABLET | ORAL | Status: DC

## 2014-12-22 ENCOUNTER — Encounter: Payer: Self-pay | Admitting: *Deleted

## 2014-12-22 ENCOUNTER — Ambulatory Visit: Payer: Commercial Managed Care - HMO

## 2014-12-22 ENCOUNTER — Other Ambulatory Visit: Payer: Self-pay | Admitting: *Deleted

## 2014-12-22 ENCOUNTER — Encounter: Payer: Self-pay | Admitting: Family Medicine

## 2014-12-22 DIAGNOSIS — E1065 Type 1 diabetes mellitus with hyperglycemia: Secondary | ICD-10-CM

## 2014-12-22 DIAGNOSIS — IMO0002 Reserved for concepts with insufficient information to code with codable children: Secondary | ICD-10-CM

## 2014-12-22 DIAGNOSIS — E108 Type 1 diabetes mellitus with unspecified complications: Principal | ICD-10-CM

## 2014-12-22 NOTE — Assessment & Plan Note (Signed)
We had long discussion about constipation and bowel habits. She did not seem to do well with the MiraLAX daily. We'll try her on bisacodyl.

## 2014-12-22 NOTE — Assessment & Plan Note (Addendum)
She's a lot of problems with chronic cough. I think part is related to her vocal cord issues, so much related to her allergic rhinitis etc. I'll give her a small prescription of Tylenol No. 3 to use 1 at bedtime when necessary. Unfortunately this will work against the issue she's having with constipation so I will try to. keep this dose low and not daily.

## 2014-12-22 NOTE — Patient Outreach (Signed)
[facility]) Care Management  12/22/2014  [name] 1950-09-09 [id number]  Referral to Health Coach completed.  Sherrin Daisy, RN BSN [place] Management Coordinator [facility] Care Management  [phone]

## 2014-12-22 NOTE — Patient Outreach (Signed)
[facility] ([facility]) Care Management  [facility] Social Work  12/22/2014  Katha L Armendarez 01/10/1951 [id number]   Current Medications:  Current Outpatient Prescriptions  Medication Sig Dispense Refill  . acyclovir (ZOVIRAX) 200 MG capsule TAKE 2 CAPSULES TWICE DAILY 360 capsule 3  . albuterol (PROVENTIL HFA;VENTOLIN HFA) 108 (90 BASE) MCG/ACT inhaler Inhale 1-2 puffs into the lungs every 6 (six) hours as needed for wheezing or shortness of breath. 1 Inhaler 12  . albuterol (PROVENTIL) (2.5 MG/3ML) 0.083% nebulizer solution Take 2.5 mg by nebulization every 6 (six) hours as needed for wheezing or shortness of breath.    . allopurinol (ZYLOPRIM) 300 MG tablet TAKE 1 TABLET EVERY DAY 90 tablet 3  . ammonium lactate (LAC-HYDRIN) 12 % lotion Apply 1 application topically 2 (two) times daily as needed for dry skin.    . aspirin EC 81 MG tablet Take 81 mg by mouth daily.    . atorvastatin (LIPITOR) 80 MG tablet Take 1 tablet (80 mg total) by mouth daily. 90 tablet 3  . azelastine (ASTELIN) 0.1 % nasal spray Place 2 sprays into both nostrils daily. Use in each nostril as directed    . baclofen (LIORESAL) 10 MG tablet Take 5-10 mg by mouth daily as needed for muscle spasms.    . beclomethasone (QVAR) 80 MCG/ACT inhaler Inhale 2 puffs into the lungs daily.    . bisacodyl (DULCOLAX) 5 MG EC tablet Take one or two tablets by mouth at bedtime prn constipation 60 tablet 1  . Blood Glucose Monitoring Suppl (ACCU-CHEK AVIVA PLUS) W/DEVICE KIT Use to check blood sugar 3-4 times daily 1 kit 0  . brimonidine (ALPHAGAN P) 0.1 % SOLN Place 1 drop into the left eye 2 (two) times daily.     . brimonidine (ALPHAGAN) 0.15 % ophthalmic solution     . Dulaglutide (TRULICITY) 0.75 MG/0.5ML SOPN Inject 0.75 mg into the skin once a week. 4 pen 3  . EPINEPHrine 0.3 mg/0.3 mL IJ SOAJ injection Inject 0.3 mLs (0.3 mg total) into the muscle once as needed (for allergic reaction). 1 Device 3  . esomeprazole (NEXIUM) 40  MG capsule Take 1 capsule (40 mg total) by mouth daily. 90 capsule 3  . ferrous sulfate 325 (65 FE) MG tablet Take 325 mg by mouth daily.    . furosemide (LASIX) 40 MG tablet Take 1 tablet (40 mg total) by mouth daily. 30 tablet 3  . gabapentin (NEURONTIN) 300 MG capsule 2 tabs every morning, 1 tab at noon, and 2 tabs every evening. (Patient taking differently: Take 300-600 mg by mouth 3 (three) times daily. 2 tabs every morning, 1 tab at noon, and 2 tabs every evening.) 450 capsule 3  . glucose blood test strip Test blood sugar 3-4 times daily 400 each 12  . insulin aspart (NOVOLOG) 100 UNIT/ML FlexPen Inject 30 units with early meal and 30 units with late meal. Dispense Quantity sufficient. (Patient taking differently: Inject 30 Units into the skin 2 (two) times daily with a meal. Inject 30 units with early meal and 30 units with late meal. Dispense Quantity sufficient.) 15 mL 5  . Insulin Glargine (LANTUS SOLOSTAR) 100 UNIT/ML Solostar Pen INJECT  65 UNITS SUBCUTANEOUSLY EVERY DAY 65 mL 3  . Lancets MISC Use twice or three times daily to check blood sugar 200 each 12  . latanoprost (XALATAN) 0.005 % ophthalmic solution Place 1 drop into both eyes at bedtime.    . losartan (COZAAR) 100 MG tablet TAKE 1   TABLET EVERY DAY 90 tablet 3  . metoprolol succinate (TOPROL-XL) 50 MG 24 hr tablet Take 3 tablets (150 mg total) by mouth daily. Take with or immediately following a meal. 270 tablet 3  . montelukast (SINGULAIR) 10 MG tablet TAKE 1 TABLET EVERY DAY 90 tablet 3  . Multiple Vitamins-Minerals (MULTIVITAMIN PO) Take 1 tablet by mouth daily.    . nitroGLYCERIN (NITROSTAT) 0.4 MG SL tablet Place 0.4 mg under the tongue every 5 (five) minutes as needed for chest pain.    . promethazine-codeine (PHENERGAN WITH CODEINE) 6.25-10 MG/5ML syrup Take 5 mLs by mouth every 6 (six) hours as needed for cough. 818 mL 0  . salicylic acid 17 % gel Apply topically 2 (two) times daily. Apply small amount to callous area on  bottom of foot BID (Patient taking differently: Apply 1 application topically as needed (for foot). Apply small amount to callous area on bottom of foot BID) 14 g 3  . sertraline (ZOLOFT) 100 MG tablet TAKE 1 AND 1/2 TABLETS AT BEDTIME (Patient taking differently: TAKE 1 AND 1/2 TABLETS (150MG) AT BEDTIME) 135 tablet 3  . spironolactone (ALDACTONE) 25 MG tablet Take 1 tablet (25 mg total) by mouth daily before supper. 90 tablet 3  . Vitamin D, Ergocalciferol, (DRISDOL) 50000 UNITS CAPS capsule Take 1 capsule (50,000 Units total) by mouth every 30 (thirty) days. Take on 3rd of the month     No current facility-administered medications for this visit.    Functional Status:  In your present state of health, do you have any difficulty performing the following activities: 12/05/2014 01/19/2014  Hearing? N N  Vision? Y Y  Difficulty concentrating or making decisions? N N  Walking or climbing stairs? Y N  Dressing or bathing? N N  Doing errands, shopping? N -  Preparing Food and eating ? N -  Using the Toilet? N -  In the past six months, have you accidently leaked urine? N -  Do you have problems with loss of bowel control? N -  Managing your Medications? N -  Managing your Finances? N -  Housekeeping or managing your Housekeeping? N -    Fall/Depression Screening:  PHQ 2/9 Scores 12/21/2014 12/05/2014 10/26/2014 08/10/2014 06/22/2014 06/01/2014 03/30/2014  PHQ - 2 Score 0 1 0 0 0 0 0    Assessment:   CSW received a call from patient today requesting to reschedule the routine home visit, currently scheduled for Tuesday, May 17th at 10:00am.  Patient admits it was an oversight on her part when the visit was scheduled via CSW  During the last routine home visit on Wednesday, April 6th.  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  CSW voiced understanding, agreeing to reschedule the home visit at  a time and date that is more convenient for patient.  Patient and CSW agreed to meet on Thursday, May 12th at 10:00am.  Plan:   CSW will meet with patient for a routine home visit on Thursday, May 12th at 10:00am to review positive coping mechanisms and reinforce relaxation techniques to help patient cope with feelings and symptoms of depression.  Nat Christen, BSW, MSW, [facility] [place], [place], [place] [address] [email or web address] [phone]

## 2014-12-22 NOTE — Assessment & Plan Note (Signed)
I'm not sure how she ended up with referral to behavioral health she seems upbeat about that so we'll see what they say. In the interim I will continue her Zoloft.

## 2014-12-22 NOTE — Progress Notes (Signed)
   Subjective:    Patient ID: [name] [name], female    DOB: June 22, 1951, 64 y.o.   MRN: [id number]  HPI #1. Abdominal pain. She continues to have problems with constipation. She did not think the daily MiraLAX was helpful so she stopped it. She wants something that she can take at night and have a normal bowel movement in the next morning. She has been followed by gastroenterology but does not have an appointment see them back for the next 2-3 months. She has a bowel movement there is no change in stool caliber, no blood in stool, no excessive mucus. She feels bloated much of the time and wishes she could have bowel movement more often. #2. Chronic cough. History of vocal cord dysfunction with multiple workups in the past, has had some increase in her cough with the allergy season. Would like to try something different from the [facility] that I previously given her. She really likes the cough syrup I gave her but it made her sleepy so she doesn't want that again. #3. Continues to have some lower extremity edema. Is not taking her Lasix every day. On days when she's going out she hasn't was taken because she does not want to have urinary incontinence. #4. Was supposed to have some type of diabetes education set up. Unclear who was go set this up for her but she thinks it was here at family medicine #5. Her home health nurse has evidently set her up for some type of behavioral health management for evaluation of her depression, #6. Regarding social issues she continues to receive Meals on Wheels 3 times a week but still has some problems within the month affording food. This significantly affects her diabetes management.   Review of Systems Review of Systems  Constitutional: Negative for: fever, activity change, appetite change, fatigue, unexpected weight change.    Respiratory: , no difficulty breathing.  No SOB. Cardiovascular:  No chest pain or heart palpitations. Gastrointestinal: Negative  for: nausea,  Noted no blood in stool.   Psychiatric/Behavioral: Negative for: confusion, sleep disturbance, dysphoric mood, decreased concentration and agitation. The patient is not nervous/anxious.        Objective:   Physical Exam  Vital signs reviewed. GENERAL: Well-developed, well-nourished, no acute distress. HEENT: Blind. Resting nystagmus. CARDIOVASCULAR: Regular rate and rhythm no murmur gallop or rub LUNGS: Clear to auscultation bilaterally, no rales or wheeze. ABDOMEN: Soft positive bowel sounds. No tenderness, no masses noted on this exam is limited by habitus. There is no rebound or guarding. MSK: Movement of extremity x 4. She uses a cane for assistance with walking. She's a little unsteady on her feet which is her baseline. EDEMA: Lower extremity has trace pitting edema to the ankle bilaterally.        Assessment & Plan:

## 2014-12-23 ENCOUNTER — Ambulatory Visit (INDEPENDENT_AMBULATORY_CARE_PROVIDER_SITE_OTHER): Payer: Commercial Managed Care - HMO

## 2014-12-23 DIAGNOSIS — J309 Allergic rhinitis, unspecified: Secondary | ICD-10-CM | POA: Diagnosis not present

## 2014-12-28 ENCOUNTER — Other Ambulatory Visit: Payer: Self-pay | Admitting: Family Medicine

## 2014-12-29 ENCOUNTER — Ambulatory Visit (INDEPENDENT_AMBULATORY_CARE_PROVIDER_SITE_OTHER): Payer: Commercial Managed Care - HMO

## 2014-12-29 ENCOUNTER — Telehealth: Payer: Self-pay | Admitting: *Deleted

## 2014-12-29 DIAGNOSIS — J309 Allergic rhinitis, unspecified: Secondary | ICD-10-CM | POA: Diagnosis not present

## 2014-12-29 MED ORDER — ALBUTEROL SULFATE HFA 108 (90 BASE) MCG/ACT IN AERS: 2.0000 | INHALATION_SPRAY | Freq: Four times a day (QID) | RESPIRATORY_TRACT | Status: DC | PRN

## 2014-12-29 NOTE — Telephone Encounter (Signed)
Received fax from [facility].  Pt is currently rx'd proair, but ventolin would be much cheaper for pt.  They would like for MD to send in a new Rx if she agrees. Terren Jandreau, [name]

## 2014-12-30 ENCOUNTER — Telehealth: Payer: Self-pay | Admitting: Family Medicine

## 2014-12-30 NOTE — Telephone Encounter (Signed)
Needs [employer] [facility] referral put in for Dr. 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

## 2015-01-03 NOTE — Telephone Encounter (Signed)
Pt has appt tomorrow morning. Needs referral before her visit

## 2015-01-03 NOTE — Telephone Encounter (Signed)
Contacted Judeen Hammans and informed her of the [place]. #[id number] for her visit tomorrow at Dr. Bertrum Sol. Katharina Caper, April D

## 2015-01-04 ENCOUNTER — Encounter: Payer: Self-pay | Admitting: Internal Medicine

## 2015-01-04 ENCOUNTER — Ambulatory Visit (INDEPENDENT_AMBULATORY_CARE_PROVIDER_SITE_OTHER): Payer: Commercial Managed Care - HMO | Admitting: Internal Medicine

## 2015-01-04 ENCOUNTER — Ambulatory Visit (INDEPENDENT_AMBULATORY_CARE_PROVIDER_SITE_OTHER): Payer: Commercial Managed Care - HMO

## 2015-01-04 VITALS — BP 118/62 | HR 77 | Ht 61.0 in | Wt 197.6 lb

## 2015-01-04 DIAGNOSIS — J309 Allergic rhinitis, unspecified: Secondary | ICD-10-CM

## 2015-01-04 DIAGNOSIS — J449 Chronic obstructive pulmonary disease, unspecified: Secondary | ICD-10-CM

## 2015-01-04 DIAGNOSIS — J301 Allergic rhinitis due to pollen: Secondary | ICD-10-CM

## 2015-01-04 DIAGNOSIS — G4733 Obstructive sleep apnea (adult) (pediatric): Secondary | ICD-10-CM

## 2015-01-04 MED ORDER — GUAIFENESIN-CODEINE 100-10 MG/5ML PO SYRP: 5.0000 mL | ORAL_SOLUTION | Freq: Three times a day (TID) | ORAL | Status: DC | PRN

## 2015-01-04 NOTE — Assessment & Plan Note (Signed)
Generally good control Would like to have a cough syrup for occasional symptom help- discussed Plan- try Cheracol

## 2015-01-04 NOTE — Patient Instructions (Signed)
We can continue allergy vaccine 1:10 GH   We can continue CPAP 12/ [employer]  Script to try Cheracol cough syrup

## 2015-01-04 NOTE — Progress Notes (Signed)
Patient ID: [name] [name], female    DOB: Mar 20, 1951, 64 y.o.   MRN: [id number]  HPI 01/24/11 89 yoF former smoker followed for asthma, sleep apnea, complicated by hx depression, vocal cord paralysis, GERD. Last here August 17, 2010- note reviewed  Doesn't wear CPAP- can't tolerate it on her face. Didn't like nasal pillows or her current nasal mask. Has own teeth. Aware she tosses and turns at night. CPAP 12 Advanced.  Denies recent asthma attacks. Uses rescue inhaler 1-2x/ month. Continues allergy shots and says she breathes better and coughs less since she has been on them.  No longer chokes or coughs much with eating as she used to. No recent colds.   07/31/11- 22 yoF former smoker followed for asthma, sleep apnea, complicated by hx depression, vocal cord paralysis, GERD. Has had flu vaccine. Continues CPAP at 12 CWP,  used all night every night. She had moved, and can't find her nebulizer machine. We discussed whether she would use it this winter. She continues allergy vaccine and believes that has helped.  03/01/13- 29 yoF former smoker followed for asthma/ COPD, sleep apnea, complicated by hx depression, vocal cord paralysis, GERD. FOLLOWS FOR: has good and bad days since no longer on vaccine; Wears CPAP 12/ Advanced every night for about 8-9 hours; pressure working well for patient. Neck pain s/p c-spine fusion, ,imits comfortable sleep position with CPAP. Occ wheeze and persistent nose and chest congestion despite meds. Would like to restart allergy vaccine- discussed. Thinks she is mobile enough with [facility] transport to get here.  PFT 2011 documented moderate obstruction w/ little response to bronchodilator.   07/01/13- 40 yoF former smoker followed for asthma/ COPD, sleep apnea, complicated by hx depression, vocal cord paralysis, GERD. FOLLOWS FOR: wears CPAP 12/ Advanced every night for abour 8 or more hours; pressure doing well for patient. Still on allergy vaccine; noticed she had  some bumps on arms after her injections(varies). Has noticed that about 5-10 minutes after Brovana treatment she has jitters and nervous feelings. Holding vaccine dose at 1:5000 [place]. She notices minor bumps but may be unrelated.  01/03/14- 15 yoF former smoker followed for asthma/ COPD, sleep apnea, complicated by hx depression, vocal cord paralysis, GERD. FOLLOWS FOR: continues to wear CPAP12/[facility] every night for about 5-9 hours; pressure is doing well; DME is [facility]. Pt is still on vaccine and doing well. CPAP doing well.  Allergy vaccine 1:50 GH, holding. Jaw pain w/ sustained walking in January. Sees Dr P.Ross/ Cards. Cath neg.  07/06/14- 42 yoF former smoker followed for asthma/ COPD, sleep apnea, complicated by hx depression, vocal cord paralysis, GERD FOLLOWS FOR: Pt taking allergy vaccine and is going well. Pt c/o dizziness that presented today. Pt had unsteady gate when walking to exam room and needed wheelchair. Pt states she is wearing her CPAP12/[facility] nightly for 5-8 hours. Pt denies issues with mask, pressure or machine.  Allergy vaccine 1:10 GH, holding. Has been feeling dizzy/unsteady. This is not new and has been intermittent over at least the last couple of years. She thinks she is doing well with allergy vaccine and seeks no changes Describes good compliance and control with CPAP CXR 05/25/14 IMPRESSION: No radiographic evidence of acute cardiopulmonary disease. Signed, Dulcy Fanny. [name], DO [facility] Electronically Signed  By: Corrie Mckusick O.D.  On: 05/25/2014 21:47  01/03/15- 35 yoF former smoker followed for asthma/ COPD, sleep apnea, complicated by hx depression, vocal cord paralysis, GERD FOLLOWS FOR:  Pt still on vaccine and doing well; no issues with vaccine. Pt states she wears CPAP 12/ [employer]  every night for about 4-9 hours.  Allergy vaccine 1:10 [place] She feels she is doing well both both therapies.   Asks cheap cough syrup for chronic intermittent dry cough 2/o wheeze. Discussed CXR from 9/15.  Review of Systems-see HPI Constitutional:   No-   weight loss, night sweats, fevers, chills, fatigue, lassitude. HEENT:   No-  headaches, difficulty swallowing, tooth/dental problems, sore throat,+ impaired vision      No-  sneezing, itching, ear ache, +nasal congestion, post nasal drip,  CV:  No-   chest pain, orthopnea, PND, swelling in lower extremities, anasarca, dizziness, palpitations Resp: No-   shortness of breath with exertion or at rest.              No-   productive cough,  + non-productive cough,  No- coughing up of blood.              No-   change in color of mucus.  +Occasional- wheezing.   Skin: - GI:  No-   heartburn, indigestion, abdominal pain, nausea, vomiting,  GU:. MS:  Carpal tunnel pains.  . Neuro-    + occasional dizziness Psych:  No- change in mood or affect. No depression or anxiety.  No memory loss.     Objective:   Physical Exam General- Alert, Oriented, Affect-appropriate, Distress- none acute. Overweight  Skin- + diffuse tiny subcutaneous nodules on the arms, nontender, mobile. I think these are in the subcutaneous fat Lymphadenopathy- none Head- atraumatic            Eyes- +Very thick corrective lenses, PERRLA, conjunctivae clear secretions, +strabismus            Ears- Hearing, canals-normal            Nose- Clear, no-Septal dev, mucus, polyps, erosion, perforation             Throat- Mallampati II , mucosa clear , drainage- none, tonsils- atrophic.  Neck- flexible , trachea midline, no stridor , thyroid nl, carotid no bruit,+ CEA scar R neck Chest - symmetrical excursion , unlabored           Heart/CV- RRR , no murmur , no gallop  , no rub, nl s1 s2                           - JVD- none , edema- none, stasis changes- none, varices- none           Lung- clear to P&A, wheeze- none, cough- none , dullness-none, rub- none           Chest wall-  Abd-  Br/  Gen/ Rectal- Not done, not indicated Extrem- cyanosis- none, clubbing, none, atrophy- none, +wheelchair Neuro- + nystagmus

## 2015-01-04 NOTE — Assessment & Plan Note (Signed)
Good compliance and control Pressure ok. Discussed good sleep habits

## 2015-01-04 NOTE — Assessment & Plan Note (Signed)
She is satisfied that allergy vaccine is helping her- good spring pollen season Plan continue allergy vaccine at 1:10 [facility]

## 2015-01-05 ENCOUNTER — Ambulatory Visit (HOSPITAL_COMMUNITY): Payer: Commercial Managed Care - HMO | Admitting: Psychiatry

## 2015-01-11 ENCOUNTER — Ambulatory Visit: Payer: Commercial Managed Care - HMO

## 2015-01-11 DIAGNOSIS — H4011X3 Primary open-angle glaucoma, severe stage: Secondary | ICD-10-CM | POA: Diagnosis not present

## 2015-01-12 ENCOUNTER — Encounter: Payer: Self-pay | Admitting: *Deleted

## 2015-01-12 ENCOUNTER — Other Ambulatory Visit: Payer: Self-pay | Admitting: *Deleted

## 2015-01-12 ENCOUNTER — Ambulatory Visit (INDEPENDENT_AMBULATORY_CARE_PROVIDER_SITE_OTHER): Payer: Commercial Managed Care - HMO

## 2015-01-12 DIAGNOSIS — J309 Allergic rhinitis, unspecified: Secondary | ICD-10-CM

## 2015-01-12 NOTE — Patient Outreach (Signed)
[place] [facility]) Care Management  [facility] Social Work  01/12/2015  [name] [name] 17-Oct-1950 [id number]  Subjective:    Objective:   Current Medications:  Current Outpatient Prescriptions  Medication Sig Dispense Refill  . acetaminophen-codeine (TYLENOL #3) 300-30 MG per tablet     . acyclovir (ZOVIRAX) 200 MG capsule TAKE 2 CAPSULES TWICE DAILY 360 capsule 3  . albuterol (PROVENTIL HFA;VENTOLIN HFA) 108 (90 BASE) MCG/ACT inhaler Inhale 2 puffs into the lungs every 6 (six) hours as needed for wheezing or shortness of breath. 3 Inhaler 3  . albuterol (PROVENTIL) (2.5 MG/3ML) 0.083% nebulizer solution Take 2.5 mg by nebulization every 6 (six) hours as needed for wheezing or shortness of breath.    . allopurinol (ZYLOPRIM) 300 MG tablet TAKE 1 TABLET EVERY DAY 90 tablet 3  . ammonium lactate (LAC-HYDRIN) 12 % lotion Apply 1 application topically 2 (two) times daily as needed for dry skin.    [name] aspirin EC 81 MG tablet Take 81 mg by mouth daily.    [name] atorvastatin (LIPITOR) 80 MG tablet Take 1 tablet (80 mg total) by mouth daily. 90 tablet 3  . azelastine (ASTELIN) 0.1 % nasal spray Place 2 sprays into both nostrils daily. Use in each nostril as directed    . baclofen (LIORESAL) 10 MG tablet Take 5-10 mg by mouth daily as needed for muscle spasms.    . beclomethasone (QVAR) 80 MCG/ACT inhaler Inhale 2 puffs into the lungs daily.    . bisacodyl (DULCOLAX) 5 MG EC tablet Take one or two tablets by mouth at bedtime prn constipation 60 tablet 1  . Blood Glucose Monitoring Suppl (ACCU-CHEK AVIVA PLUS) W/DEVICE KIT Use to check blood sugar 3-4 times daily 1 kit 0  . brimonidine (ALPHAGAN P) 0.1 % SOLN Place 1 drop into the left eye 2 (two) times daily.     . Dulaglutide (TRULICITY) 9.15 AV/6.9VX SOPN Inject 0.75 mg into the skin once a week. 4 pen 3  . EPINEPHrine 0.3 mg/0.3 mL IJ SOAJ injection Inject 0.3 mLs (0.3 mg total) into the muscle once as needed (for allergic reaction). 1  Device 3  . esomeprazole (NEXIUM) 40 MG capsule Take 1 capsule (40 mg total) by mouth daily. 90 capsule 3  . ferrous sulfate 325 (65 FE) MG tablet Take 325 mg by mouth daily.    . furosemide (LASIX) 40 MG tablet TAKE 1 TABLET EVERY DAY 90 tablet 3  . gabapentin (NEURONTIN) 300 MG capsule 2 tabs every morning, 1 tab at noon, and 2 tabs every evening. (Patient taking differently: Take 300-600 mg by mouth 3 (three) times daily. 2 tabs every morning, 1 tab at noon, and 2 tabs every evening.) 450 capsule 3  . glucose blood test strip Test blood sugar 3-4 times daily 400 each 12  . guaiFENesin-codeine (ROBITUSSIN AC) 100-10 MG/5ML syrup Take 5 mLs by mouth 3 (three) times daily as needed for cough. 120 mL 0  . insulin aspart (NOVOLOG) 100 UNIT/ML FlexPen Inject 30 units with early meal and 30 units with late meal. Dispense Quantity sufficient. (Patient taking differently: Inject 30 Units into the skin 2 (two) times daily with a meal. Inject 30 units with early meal and 30 units with late meal. Dispense Quantity sufficient.) 15 mL 5  . Insulin Glargine (LANTUS SOLOSTAR) 100 UNIT/ML Solostar Pen INJECT  65 UNITS SUBCUTANEOUSLY EVERY DAY 65 mL 3  . Lancets MISC Use twice or three times daily to check blood sugar 200 each 12  .  latanoprost (XALATAN) 0.005 % ophthalmic solution Place 1 drop into both eyes at bedtime.    [name] losartan (COZAAR) 100 MG tablet TAKE 1 TABLET EVERY DAY 90 tablet 3  . metoprolol succinate (TOPROL-XL) 50 MG 24 hr tablet Take 3 tablets (150 mg total) by mouth daily. Take with or immediately following a meal. 270 tablet 3  . montelukast (SINGULAIR) 10 MG tablet TAKE 1 TABLET EVERY DAY 90 tablet 3  . Multiple Vitamins-Minerals (MULTIVITAMIN PO) Take 1 tablet by mouth daily.    . nitroGLYCERIN (NITROSTAT) 0.4 MG SL tablet Place 0.4 mg under the tongue every 5 (five) minutes as needed for chest pain.    . salicylic acid 17 % gel Apply topically 2 (two) times daily. Apply small amount to  callous area on bottom of foot BID (Patient taking differently: Apply 1 application topically as needed (for foot). Apply small amount to callous area on bottom of foot BID) 14 g 3  . sertraline (ZOLOFT) 100 MG tablet TAKE 1 AND 1/2 TABLETS AT BEDTIME (Patient taking differently: TAKE 1 AND 1/2 TABLETS (150MG) AT BEDTIME) 135 tablet 3  . spironolactone (ALDACTONE) 25 MG tablet Take 1 tablet (25 mg total) by mouth daily before supper. 90 tablet 3  . Vitamin D, Ergocalciferol, (DRISDOL) 50000 UNITS CAPS capsule Take 1 capsule (50,000 Units total) by mouth every 30 (thirty) days. Take on 3rd of the month     No current facility-administered medications for this visit.    Functional Status:  In your present state of health, do you have any difficulty performing the following activities: 01/12/2015 12/05/2014  Hearing? N N  Vision? Y Y  Difficulty concentrating or making decisions? N N  Walking or climbing stairs? Y Y  Dressing or bathing? N N  Doing errands, shopping? N N  Preparing Food and eating ? N N  Using the Toilet? N N  In the past six months, have you accidently leaked urine? N N  Do you have problems with loss of bowel control? N N  Managing your Medications? Y N  Managing your Finances? N N  Housekeeping or managing your Housekeeping? Y N    Fall/Depression Screening:  PHQ 2/9 Scores 01/12/2015 12/21/2014 12/05/2014 10/26/2014 08/10/2014 06/22/2014 06/01/2014  PHQ - 2 Score 0 0 1 0 0 0 0    Assessment:   CSW was able to meet with patient today to perform a routine home visit.  Patient appeared to be in good spirits today, showing CSW the card and boutique of flowers that her son, [name] [name] had given to her for Mother's Day.  Patient was grateful to have been remembered, as she did not believe that her son would even say Happy Mother's Day, let alone buy her a gift.  Patient admits that her symptoms of Depression have become less prominent and more tolerable, as of late. Patient  indicated that she has had an opportunity to review the supplies order form that CSW mailed to her home.  Patient reported that she would really benefit from a talking medication box, as patient currently struggles with being able to see to take her medications properly.  Patient has undergone a cataract extraction, bilateral eye surgeries and carpal tunnel removal, leaving patient almost completely blind.  Patient is also responsible for drawing up her insulin and injecting the accurate dosage. During the home visit, CSW was able to assist patient with completion of an FAF (Financial Assessment Form) to determine whether or not patient would be  eligible to receive financial assistance through [facility] [employer] to obtain a talking medication box.  CSW agreed to submit the completed FAF, along with an order form, to Bary Castilla, Surveyor, quantity with [employer], clinical (histocompatibility and immunogenetics) for processing. If patient is approved for the talking medication box, CSW agreed to deliver the device during the next routine home visit, scheduled with patient for Thursday, May 26th at 10:00am.  CSW will also request a pharmacy consult to assist patient with setting up her initial month's worth of medications. Patient continues to receive one free hot meal per day through [employer] with [employer] [facility] [place].  Patient still has the bus passes that CSW provided to her for transport to and from her physician appointments through [employer] ([place]). Patient indicated that her application for [employer] would expire at the end of the month and that she needed assistance with the re-enrollment process.  CSW was able to assist patient with all the necessary paperwork, agreeing to mail the application to the Department of Transportation for processing.  Patient is aware that she will be contacted within the next two weeks to schedule an in-person interview.  No additional  social work needs have been identified at this time, so CSW will plan to meet with patient again on the 26th.  Plan:   CSW will submit patient's completed FAF (Financial Assessment Form) to Bary Castilla, Surveyor, quantity with [place] [employer] for review and processing to determine eligibility of patient being able to receive a talking medication box for home use. CSW will fax a correspondence letter to patient's Primary Care Physician, Dr. [name] to ensure that Dr. Nori Riis is aware of CSW's involvement with patient. CSW will meet with patient for the next routine home visit on Thursday, May 26th at 10:00am.  Nat Christen, BSW, MSW, [facility] [place], [place], [place] [id number] [email or web address] [phone]

## 2015-01-16 NOTE — Progress Notes (Signed)
Appreciate help w her [name]

## 2015-01-17 ENCOUNTER — Ambulatory Visit: Payer: Commercial Managed Care - HMO | Admitting: *Deleted

## 2015-01-19 ENCOUNTER — Ambulatory Visit (INDEPENDENT_AMBULATORY_CARE_PROVIDER_SITE_OTHER): Payer: Commercial Managed Care - HMO

## 2015-01-19 DIAGNOSIS — J309 Allergic rhinitis, unspecified: Secondary | ICD-10-CM

## 2015-01-26 ENCOUNTER — Ambulatory Visit (INDEPENDENT_AMBULATORY_CARE_PROVIDER_SITE_OTHER): Payer: Commercial Managed Care - HMO

## 2015-01-26 ENCOUNTER — Other Ambulatory Visit: Payer: Self-pay | Admitting: *Deleted

## 2015-01-26 ENCOUNTER — Encounter: Payer: Self-pay | Admitting: *Deleted

## 2015-01-26 DIAGNOSIS — J309 Allergic rhinitis, unspecified: Secondary | ICD-10-CM

## 2015-01-26 NOTE — Patient Outreach (Signed)
[facility]) Care Management  [facility] Social Work  01/26/2015  JAELENE GARCIAGARCIA December 24, 1950 [id number]    Current Medications:  Current Outpatient Prescriptions  Medication Sig Dispense Refill  . acetaminophen-codeine (TYLENOL #3) 300-30 MG per tablet     . acyclovir (ZOVIRAX) 200 MG capsule TAKE 2 CAPSULES TWICE DAILY 360 capsule 3  . albuterol (PROVENTIL HFA;VENTOLIN HFA) 108 (90 BASE) MCG/ACT inhaler Inhale 2 puffs into the lungs every 6 (six) hours as needed for wheezing or shortness of breath. 3 Inhaler 3  . albuterol (PROVENTIL) (2.5 MG/3ML) 0.083% nebulizer solution Take 2.5 mg by nebulization every 6 (six) hours as needed for wheezing or shortness of breath.    . allopurinol (ZYLOPRIM) 300 MG tablet TAKE 1 TABLET EVERY DAY 90 tablet 3  . ammonium lactate (LAC-HYDRIN) 12 % lotion Apply 1 application topically 2 (two) times daily as needed for dry skin.    [name] aspirin EC 81 MG tablet Take 81 mg by mouth daily.    [name] atorvastatin (LIPITOR) 80 MG tablet Take 1 tablet (80 mg total) by mouth daily. 90 tablet 3  . azelastine (ASTELIN) 0.1 % nasal spray Place 2 sprays into both nostrils daily. Use in each nostril as directed    . baclofen (LIORESAL) 10 MG tablet Take 5-10 mg by mouth daily as needed for muscle spasms.    . beclomethasone (QVAR) 80 MCG/ACT inhaler Inhale 2 puffs into the lungs daily.    . bisacodyl (DULCOLAX) 5 MG EC tablet Take one or two tablets by mouth at bedtime prn constipation 60 tablet 1  . Blood Glucose Monitoring Suppl (ACCU-CHEK AVIVA PLUS) W/DEVICE KIT Use to check blood sugar 3-4 times daily 1 kit 0  . brimonidine (ALPHAGAN P) 0.1 % SOLN Place 1 drop into the left eye 2 (two) times daily.     . Dulaglutide (TRULICITY) 5.85 ID/7.8EU SOPN Inject 0.75 mg into the skin once a week. 4 pen 3  . EPINEPHrine 0.3 mg/0.3 mL IJ SOAJ injection Inject 0.3 mLs (0.3 mg total) into the muscle once as needed (for allergic reaction). 1 Device 3  . esomeprazole  (NEXIUM) 40 MG capsule Take 1 capsule (40 mg total) by mouth daily. 90 capsule 3  . ferrous sulfate 325 (65 FE) MG tablet Take 325 mg by mouth daily.    . furosemide (LASIX) 40 MG tablet TAKE 1 TABLET EVERY DAY 90 tablet 3  . gabapentin (NEURONTIN) 300 MG capsule 2 tabs every morning, 1 tab at noon, and 2 tabs every evening. (Patient taking differently: Take 300-600 mg by mouth 3 (three) times daily. 2 tabs every morning, 1 tab at noon, and 2 tabs every evening.) 450 capsule 3  . glucose blood test strip Test blood sugar 3-4 times daily 400 each 12  . guaiFENesin-codeine (ROBITUSSIN AC) 100-10 MG/5ML syrup Take 5 mLs by mouth 3 (three) times daily as needed for cough. 120 mL 0  . insulin aspart (NOVOLOG) 100 UNIT/ML FlexPen Inject 30 units with early meal and 30 units with late meal. Dispense Quantity sufficient. (Patient taking differently: Inject 30 Units into the skin 2 (two) times daily with a meal. Inject 30 units with early meal and 30 units with late meal. Dispense Quantity sufficient.) 15 mL 5  . Insulin Glargine (LANTUS SOLOSTAR) 100 UNIT/ML Solostar Pen INJECT  65 UNITS SUBCUTANEOUSLY EVERY DAY 65 mL 3  . Lancets MISC Use twice or three times daily to check blood sugar 200 each 12  . latanoprost (XALATAN) 0.005 %  ophthalmic solution Place 1 drop into both eyes at bedtime.    [name] losartan (COZAAR) 100 MG tablet TAKE 1 TABLET EVERY DAY 90 tablet 3  . metoprolol succinate (TOPROL-XL) 50 MG 24 hr tablet Take 3 tablets (150 mg total) by mouth daily. Take with or immediately following a meal. 270 tablet 3  . montelukast (SINGULAIR) 10 MG tablet TAKE 1 TABLET EVERY DAY 90 tablet 3  . Multiple Vitamins-Minerals (MULTIVITAMIN PO) Take 1 tablet by mouth daily.    . nitroGLYCERIN (NITROSTAT) 0.4 MG SL tablet Place 0.4 mg under the tongue every 5 (five) minutes as needed for chest pain.    . salicylic acid 17 % gel Apply topically 2 (two) times daily. Apply small amount to callous area on bottom of foot  BID (Patient taking differently: Apply 1 application topically as needed (for foot). Apply small amount to callous area on bottom of foot BID) 14 g 3  . sertraline (ZOLOFT) 100 MG tablet TAKE 1 AND 1/2 TABLETS AT BEDTIME (Patient taking differently: TAKE 1 AND 1/2 TABLETS (150MG) AT BEDTIME) 135 tablet 3  . spironolactone (ALDACTONE) 25 MG tablet Take 1 tablet (25 mg total) by mouth daily before supper. 90 tablet 3  . Vitamin D, Ergocalciferol, (DRISDOL) 50000 UNITS CAPS capsule Take 1 capsule (50,000 Units total) by mouth every 30 (thirty) days. Take on 3rd of the month     No current facility-administered medications for this visit.    Functional Status:  In your present state of health, do you have any difficulty performing the following activities: 01/26/2015 01/12/2015  Hearing? N N  Vision? Y Y  Difficulty concentrating or making decisions? N N  Walking or climbing stairs? N Y  Dressing or bathing? N N  Doing errands, shopping? N N  Preparing Food and eating ? N N  Using the Toilet? N N  In the past six months, have you accidently leaked urine? N N  Do you have problems with loss of bowel control? N N  Managing your Medications? Y Y  Managing your Finances? N N  Housekeeping or managing your Housekeeping? [name]    Fall/Depression Screening:  PHQ 2/9 Scores 01/26/2015 01/12/2015 12/21/2014 12/05/2014 10/26/2014 08/10/2014 06/22/2014  PHQ - 2 Score 0 0 0 1 0 0 0    Assessment:   CSW was able to meet with patient today to perform the routine home visit.  Patient's son, Claudine Mouton was at home during the entire visit, so when CSW arrived at patient's home, patient whispered, "I can't really talk today".  Patient's son works at [employer] on campus at [employer] so he is currently out of work for the summer while all the students are on break.  Avis stayed in his room for the majority of the visit, only coming out briefly to eat the meal that [employer], through [employer] of  [facility], had just delivered. CSW provided patient with her new MedCenter Medication System, purchased through [employer], after patient was approved for financial assistance.  CSW was able to assist patient with programming the device, as well as filling the 31-day MedCenter with all of patient's prescription medications.  Patient was extremely pleased to have received the MedCenter, as it lights up in large bold letters, provides an alarm and is voice activated.  CSW explained to patient that CSW would submit a request to have a [employer] teacher consult with patient to ensure that  patient is using the [place] properly.  In addition, patient had some questions and concerns about her prescription for Gabapentin, wanting to discuss side effects and possible interaction with other prescription medications. Patient also felt that it may be beneficial to have an RNCM with [facility] pay her a home visit, as patient admits to daily edema and swelling in hands, legs and feet, worsening throughout the day and into the evening hours.  Patient would like to discuss proper diet and a safe exercise routine with an RNCM, as she reports that her two previous RNCM's were extremely knowledgeable and helpful.  CSW also agreed to submit a request for an RNCM to follow patient for disease management.  Patient reported that she receives a call from her [facility] Case Manager, Olivia Mackie, but that it is only once per month.  Patient believes that she needs to be consulted a lot more often. Patient admitted to [place] that she "forgot" to attend her initial assessment with the [facility].  Patient has yet to reschedule the initial assessment, indicating, "I just don't know if I'm ready to start the process all over again".  CSW voiced understanding, encouraging patient to give it some more thought before  just "giving up" on re-scheduling the appointment altogether.  Patient agreed, stating that she would have an answer for CSW during the next routine home visit, scheduled for Thursday, June 30th at 10:00am. No additional needs reported at this time.  Patient admits to taking her medications as prescribed and attending all her scheduled physician appointments.  Patient has an appointment today to receive an injection, reporting that she still has enough bus passes to last her through the end of June, provided to her by CSW, at the expense of [employer].  Patient's relationship with Avis is getting better, per patient's report.   Plan:   CSW will plan to meet with patient for the next scheduled routine home visit on Thursday, June 30th at 10:00am. CSW will fax a correspondence letter to patient's Primary Care Physician, Dr. [name] to ensure that Dr. Nori Riis is aware of CSW's involvement with patient. CSW will submit a request for pharmacy services to Lurline Del, Care Management Assistant with [facility] [facility], in the form of an [employer]. CSW will submit a request for nursing services to Lurline Del, Care Management Assistant with [facility], in the form of an In [employer].   Nat Christen, BSW, MSW, [place] [facility] [place], [place], [place] [id number] [email or web address] [phone]

## 2015-01-30 ENCOUNTER — Other Ambulatory Visit: Payer: Self-pay | Admitting: Family Medicine

## 2015-02-02 ENCOUNTER — Ambulatory Visit (INDEPENDENT_AMBULATORY_CARE_PROVIDER_SITE_OTHER): Payer: Commercial Managed Care - HMO

## 2015-02-02 DIAGNOSIS — J309 Allergic rhinitis, unspecified: Secondary | ICD-10-CM

## 2015-02-02 NOTE — Patient Outreach (Signed)
[facility]) Care Management  02/02/2015  KEAJAH KILLOUGH Oct 05, 1950 [id number]   Message from Nat Christen, [place] for [employer] and Pharmacy needs, assigned Kathie Rhodes, RN and Deanne Coffer, PharmD.  Ronnell Freshwater. [place], [place] [facility] [place] Assistant Phone: [phone] Fax: [phone]

## 2015-02-03 ENCOUNTER — Other Ambulatory Visit: Payer: Self-pay | Admitting: *Deleted

## 2015-02-03 NOTE — Patient Outreach (Signed)
Spoke with pt, HIPPA verified, discussed referral from [facility] SW- concern about pt's edema,swelling in hands/feet.  Pt states she has more swelling today, goes up and down.  Pt states no sob.  Pt states she has hx of HTN, DM- blood sugars up and down.   Pt states she is to see Dr. Nori Riis 6/8 (routine office visit).  Discussed with pt RN CM doing a home visit, assess needs to which pt was interested in.   Home visit scheduled for 6/16 as this RN CM will be on vacation next week.      [name] [name].   [facility]  [phone]

## 2015-02-06 ENCOUNTER — Telehealth: Payer: Self-pay | Admitting: *Deleted

## 2015-02-06 ENCOUNTER — Other Ambulatory Visit: Payer: Self-pay | Admitting: Pharmacist

## 2015-02-06 NOTE — Telephone Encounter (Signed)
[name] [name] from [facility] called needing a [facility] referral completed.  Referral expired; appt 02/09/15.  Pt goes to [facility] for allergy injections.  Please give her a call once complete at [phone].  Derl Barrow, RN

## 2015-02-06 NOTE — Telephone Encounter (Signed)
Called Claiborne Billings to inform her that it was complete and when i called she stated that she had seen it already and thanked [place] for calling back. Katharina Caper, April D, [place]

## 2015-02-06 NOTE — Patient Outreach (Signed)
[facility]) Care Management  [place]   02/06/2015  [name] [name] 08-25-1951 [id number]  Subjective: [name] [name] is a 64 y.o. female who was referred to [place] for medication management. She has some questions/concerns about her gabapentin. Nat Christen, licensed [place] representative, also assisted her with filling her pill box and would like someone to make sure patient understands how to use.  Of note, this patient is known to me through the [facility]acy [facility] at [place].   I attempted to contact patient today but was unable to reach her.   Objective:   Current Medications: Current Outpatient Prescriptions  Medication Sig Dispense Refill  . acetaminophen-codeine (TYLENOL #3) 300-30 MG per tablet     . acyclovir (ZOVIRAX) 200 MG capsule TAKE 2 CAPSULES TWICE DAILY 360 capsule 3  . albuterol (PROVENTIL HFA;VENTOLIN HFA) 108 (90 BASE) MCG/ACT inhaler Inhale 2 puffs into the lungs every 6 (six) hours as needed for wheezing or shortness of breath. 3 Inhaler 3  . albuterol (PROVENTIL) (2.5 MG/3ML) 0.083% nebulizer solution Take 2.5 mg by nebulization every 6 (six) hours as needed for wheezing or shortness of breath.    . allopurinol (ZYLOPRIM) 300 MG tablet TAKE 1 TABLET EVERY DAY 90 tablet 3  . ammonium lactate (LAC-HYDRIN) 12 % lotion Apply 1 application topically 2 (two) times daily as needed for dry skin.    [name] aspirin EC 81 MG tablet Take 81 mg by mouth daily.    [name] atorvastatin (LIPITOR) 80 MG tablet Take 1 tablet (80 mg total) by mouth daily. 90 tablet 3  . azelastine (ASTELIN) 0.1 % nasal spray USE 2 SPRAYS IN EACH NOSTRIL EVERY DAY AS NEEDED AS DIRECTED 60 mL 3  . baclofen (LIORESAL) 10 MG tablet Take 5-10 mg by mouth daily as needed for muscle spasms.    . beclomethasone (QVAR) 80 MCG/ACT inhaler Inhale 2 puffs into the lungs daily.    . bisacodyl (DULCOLAX) 5 MG EC tablet Take one or two tablets by mouth at bedtime prn  constipation 60 tablet 1  . Blood Glucose Monitoring Suppl (ACCU-CHEK AVIVA PLUS) W/DEVICE KIT Use to check blood sugar 3-4 times daily 1 kit 0  . brimonidine (ALPHAGAN P) 0.1 % SOLN Place 1 drop into the left eye 2 (two) times daily.     . Dulaglutide (TRULICITY) 3.15 XY/5.8PF SOPN Inject 0.75 mg into the skin once a week. 4 pen 3  . EPINEPHrine 0.3 mg/0.3 mL IJ SOAJ injection Inject 0.3 mLs (0.3 mg total) into the muscle once as needed (for allergic reaction). 1 Device 3  . esomeprazole (NEXIUM) 40 MG capsule Take 1 capsule (40 mg total) by mouth daily. 90 capsule 3  . ferrous sulfate 325 (65 FE) MG tablet Take 325 mg by mouth daily.    . furosemide (LASIX) 40 MG tablet TAKE 1 TABLET EVERY DAY 90 tablet 3  . gabapentin (NEURONTIN) 300 MG capsule 2 tabs every morning, 1 tab at noon, and 2 tabs every evening. (Patient taking differently: Take 300-600 mg by mouth 3 (three) times daily. 2 tabs every morning, 1 tab at noon, and 2 tabs every evening.) 450 capsule 3  . glucose blood test strip Test blood sugar 3-4 times daily 400 each 12  . guaiFENesin-codeine (ROBITUSSIN AC) 100-10 MG/5ML syrup Take 5 mLs by mouth 3 (three) times daily as needed for cough. 120 mL 0  . insulin aspart (NOVOLOG) 100 UNIT/ML FlexPen Inject 30 units with  early meal and 30 units with late meal. Dispense Quantity sufficient. (Patient taking differently: Inject 30 Units into the skin 2 (two) times daily with a meal. Inject 30 units with early meal and 30 units with late meal. Dispense Quantity sufficient.) 15 mL 5  . Insulin Glargine (LANTUS SOLOSTAR) 100 UNIT/ML Solostar Pen INJECT  65 UNITS SUBCUTANEOUSLY EVERY DAY 65 mL 3  . Lancets MISC Use twice or three times daily to check blood sugar 200 each 12  . latanoprost (XALATAN) 0.005 % ophthalmic solution Place 1 drop into both eyes at bedtime.    [name] losartan (COZAAR) 100 MG tablet TAKE 1 TABLET EVERY DAY 90 tablet 3  . metoprolol succinate (TOPROL-XL) 50 MG 24 hr tablet Take 3  tablets (150 mg total) by mouth daily. Take with or immediately following a meal. 270 tablet 3  . montelukast (SINGULAIR) 10 MG tablet TAKE 1 TABLET EVERY DAY 90 tablet 3  . Multiple Vitamins-Minerals (MULTIVITAMIN PO) Take 1 tablet by mouth daily.    . nitroGLYCERIN (NITROSTAT) 0.4 MG SL tablet Place 0.4 mg under the tongue every 5 (five) minutes as needed for chest pain.    . salicylic acid 17 % gel Apply topically 2 (two) times daily. Apply small amount to callous area on bottom of foot BID (Patient taking differently: Apply 1 application topically as needed (for foot). Apply small amount to callous area on bottom of foot BID) 14 g 3  . sertraline (ZOLOFT) 100 MG tablet TAKE 1 AND 1/2 TABLETS AT BEDTIME (Patient taking differently: TAKE 1 AND 1/2 TABLETS (150MG) AT BEDTIME) 135 tablet 3  . spironolactone (ALDACTONE) 25 MG tablet Take 1 tablet (25 mg total) by mouth daily before supper. 90 tablet 3  . Vitamin D, Ergocalciferol, (DRISDOL) 50000 UNITS CAPS capsule Take 1 capsule (50,000 Units total) by mouth every 30 (thirty) days. Take on 3rd of the month     No current facility-administered medications for this visit.    Functional Status: In your present state of health, do you have any difficulty performing the following activities: 01/26/2015 01/12/2015  Hearing? N N  Vision? Y Y  Difficulty concentrating or making decisions? N N  Walking or climbing stairs? N Y  Dressing or bathing? N N  Doing errands, shopping? N N  Preparing Food and eating ? N N  Using the Toilet? N N  In the past six months, have you accidently leaked urine? N N  Do you have problems with loss of bowel control? N N  Managing your Medications? Y Y  Managing your Finances? N N  Housekeeping or managing your Housekeeping? [name]    Fall/Depression Screening: PHQ 2/9 Scores 01/26/2015 01/12/2015 12/21/2014 12/05/2014 10/26/2014 08/10/2014 06/22/2014  PHQ - 2 Score 0 0 0 1 0 0 0    Assessment: 1. Medication management:  unable to assess issues with gabapentin. However, previously, patient has been able to self-manage her medications very well.   Plan: 1. I called the patient to discuss issues with gabapentin and I had to leave a HIPAA compliant message for patient to return my phone call.  I will reach out on 02/07/15 if the patient does not return my call today. Will also alert Dr. Nori Riis to patient's issues with gabapentin as she has a visit with his provider on 02/08/15 at [facility].   Nicoletta Ba, PharmD, [place] Resident [place] [phone]

## 2015-02-07 ENCOUNTER — Other Ambulatory Visit: Payer: Self-pay | Admitting: Pharmacist

## 2015-02-07 NOTE — Patient Outreach (Signed)
[facility]) Care Management  [place]   02/07/2015  ERVIN ROTHBAUER 09/22/1950 [id number]  Subjective: [name] [name] is a 64 y.o. female who was referred to [facility] for medication management. She has some questions/concerns about her gabapentin. Nat Christen, licensed [employer] representative, also assisted her with filling her pill box and would like someone to make sure patient understands how to use.  Of note, this patient is known to me through the Pharmacy Clinic at [facility].   I attempted to contact patient today but was unable to reach her.   Objective:   Current Medications: Current Outpatient Prescriptions  Medication Sig Dispense Refill  . acetaminophen-codeine (TYLENOL #3) 300-30 MG per tablet     . acyclovir (ZOVIRAX) 200 MG capsule TAKE 2 CAPSULES TWICE DAILY 360 capsule 3  . albuterol (PROVENTIL HFA;VENTOLIN HFA) 108 (90 BASE) MCG/ACT inhaler Inhale 2 puffs into the lungs every 6 (six) hours as needed for wheezing or shortness of breath. 3 Inhaler 3  . albuterol (PROVENTIL) (2.5 MG/3ML) 0.083% nebulizer solution Take 2.5 mg by nebulization every 6 (six) hours as needed for wheezing or shortness of breath.    . allopurinol (ZYLOPRIM) 300 MG tablet TAKE 1 TABLET EVERY DAY 90 tablet 3  . ammonium lactate (LAC-HYDRIN) 12 % lotion Apply 1 application topically 2 (two) times daily as needed for dry skin.    [name] aspirin EC 81 MG tablet Take 81 mg by mouth daily.    [name] atorvastatin (LIPITOR) 80 MG tablet Take 1 tablet (80 mg total) by mouth daily. 90 tablet 3  . azelastine (ASTELIN) 0.1 % nasal spray USE 2 SPRAYS IN EACH NOSTRIL EVERY DAY AS NEEDED AS DIRECTED 60 mL 3  . baclofen (LIORESAL) 10 MG tablet Take 5-10 mg by mouth daily as needed for muscle spasms.    . beclomethasone (QVAR) 80 MCG/ACT inhaler Inhale 2 puffs into the lungs daily.    . bisacodyl (DULCOLAX) 5 MG EC tablet Take one or two tablets by mouth at bedtime prn  constipation 60 tablet 1  . Blood Glucose Monitoring Suppl (ACCU-CHEK AVIVA PLUS) W/DEVICE KIT Use to check blood sugar 3-4 times daily 1 kit 0  . brimonidine (ALPHAGAN P) 0.1 % SOLN Place 1 drop into the left eye 2 (two) times daily.     . Dulaglutide (TRULICITY) 0.97 DZ/3.2DJ SOPN Inject 0.75 mg into the skin once a week. 4 pen 3  . EPINEPHrine 0.3 mg/0.3 mL IJ SOAJ injection Inject 0.3 mLs (0.3 mg total) into the muscle once as needed (for allergic reaction). 1 Device 3  . esomeprazole (NEXIUM) 40 MG capsule Take 1 capsule (40 mg total) by mouth daily. 90 capsule 3  . ferrous sulfate 325 (65 FE) MG tablet Take 325 mg by mouth daily.    . furosemide (LASIX) 40 MG tablet TAKE 1 TABLET EVERY DAY 90 tablet 3  . gabapentin (NEURONTIN) 300 MG capsule 2 tabs every morning, 1 tab at noon, and 2 tabs every evening. (Patient taking differently: Take 300-600 mg by mouth 3 (three) times daily. 2 tabs every morning, 1 tab at noon, and 2 tabs every evening.) 450 capsule 3  . glucose blood test strip Test blood sugar 3-4 times daily 400 each 12  . guaiFENesin-codeine (ROBITUSSIN AC) 100-10 MG/5ML syrup Take 5 mLs by mouth 3 (three) times daily as needed for cough. 120 mL 0  . insulin aspart (NOVOLOG) 100 UNIT/ML FlexPen Inject 30 units with  early meal and 30 units with late meal. Dispense Quantity sufficient. (Patient taking differently: Inject 30 Units into the skin 2 (two) times daily with a meal. Inject 30 units with early meal and 30 units with late meal. Dispense Quantity sufficient.) 15 mL 5  . Insulin Glargine (LANTUS SOLOSTAR) 100 UNIT/ML Solostar Pen INJECT  65 UNITS SUBCUTANEOUSLY EVERY DAY 65 mL 3  . Lancets MISC Use twice or three times daily to check blood sugar 200 each 12  . latanoprost (XALATAN) 0.005 % ophthalmic solution Place 1 drop into both eyes at bedtime.    [name] losartan (COZAAR) 100 MG tablet TAKE 1 TABLET EVERY DAY 90 tablet 3  . metoprolol succinate (TOPROL-XL) 50 MG 24 hr tablet Take 3  tablets (150 mg total) by mouth daily. Take with or immediately following a meal. 270 tablet 3  . montelukast (SINGULAIR) 10 MG tablet TAKE 1 TABLET EVERY DAY 90 tablet 3  . Multiple Vitamins-Minerals (MULTIVITAMIN PO) Take 1 tablet by mouth daily.    . nitroGLYCERIN (NITROSTAT) 0.4 MG SL tablet Place 0.4 mg under the tongue every 5 (five) minutes as needed for chest pain.    . salicylic acid 17 % gel Apply topically 2 (two) times daily. Apply small amount to callous area on bottom of foot BID (Patient taking differently: Apply 1 application topically as needed (for foot). Apply small amount to callous area on bottom of foot BID) 14 g 3  . sertraline (ZOLOFT) 100 MG tablet TAKE 1 AND 1/2 TABLETS AT BEDTIME (Patient taking differently: TAKE 1 AND 1/2 TABLETS (150MG) AT BEDTIME) 135 tablet 3  . spironolactone (ALDACTONE) 25 MG tablet Take 1 tablet (25 mg total) by mouth daily before supper. 90 tablet 3  . Vitamin D, Ergocalciferol, (DRISDOL) 50000 UNITS CAPS capsule Take 1 capsule (50,000 Units total) by mouth every 30 (thirty) days. Take on 3rd of the month     No current facility-administered medications for this visit.    Functional Status: In your present state of health, do you have any difficulty performing the following activities: 01/26/2015 01/12/2015  Hearing? N N  Vision? Y Y  Difficulty concentrating or making decisions? N N  Walking or climbing stairs? N Y  Dressing or bathing? N N  Doing errands, shopping? N N  Preparing Food and eating ? N N  Using the Toilet? N N  In the past six months, have you accidently leaked urine? N N  Do you have problems with loss of bowel control? N N  Managing your Medications? Y Y  Managing your Finances? N N  Housekeeping or managing your Housekeeping? [name]    Fall/Depression Screening: PHQ 2/9 Scores 01/26/2015 01/12/2015 12/21/2014 12/05/2014 10/26/2014 08/10/2014 06/22/2014  PHQ - 2 Score 0 0 0 1 0 0 0    Assessment: 1. Medication management:  unable to assess issues with gabapentin. However, previously, patient has been able to self-manage her medications very well.   Plan: 1. I called the patient for the second outreach attempt to discuss issues with gabapentin and I had to leave a HIPAA compliant message for patient to return my phone call.  I will reach out on 02/09/15 if the patient does not return my call today.    Nicoletta Ba, PharmD, [place] Resident [place] [phone]

## 2015-02-08 ENCOUNTER — Other Ambulatory Visit: Payer: Self-pay | Admitting: Pharmacist

## 2015-02-08 ENCOUNTER — Ambulatory Visit (INDEPENDENT_AMBULATORY_CARE_PROVIDER_SITE_OTHER): Payer: Commercial Managed Care - HMO | Admitting: Family Medicine

## 2015-02-08 ENCOUNTER — Encounter: Payer: Self-pay | Admitting: Family Medicine

## 2015-02-08 VITALS — BP 130/59 | HR 78 | Temp 97.4°F | Ht 61.0 in | Wt 198.1 lb

## 2015-02-08 DIAGNOSIS — E1149 Type 2 diabetes mellitus with other diabetic neurological complication: Secondary | ICD-10-CM

## 2015-02-08 DIAGNOSIS — E1140 Type 2 diabetes mellitus with diabetic neuropathy, unspecified: Secondary | ICD-10-CM

## 2015-02-08 DIAGNOSIS — R109 Unspecified abdominal pain: Secondary | ICD-10-CM

## 2015-02-08 DIAGNOSIS — H543 Unqualified visual loss, both eyes: Secondary | ICD-10-CM

## 2015-02-08 DIAGNOSIS — G8929 Other chronic pain: Secondary | ICD-10-CM

## 2015-02-08 DIAGNOSIS — R1010 Upper abdominal pain, unspecified: Secondary | ICD-10-CM

## 2015-02-08 DIAGNOSIS — G63 Polyneuropathy in diseases classified elsewhere: Secondary | ICD-10-CM

## 2015-02-08 MED ORDER — DULAGLUTIDE 0.75 MG/0.5ML ~~LOC~~ SOAJ: 0.7500 mg | SUBCUTANEOUS | Status: DC

## 2015-02-08 NOTE — Assessment & Plan Note (Signed)
Talked about this again today. Does not seem like her symptoms have worsened in any way although it's still a concern for her. I don't know what else to do to work it up and I told her this. Will continue to follow it.

## 2015-02-08 NOTE — Assessment & Plan Note (Signed)
Dr. Peterson Ao saw her today in concert with me. We reviewed her medicines. She seems to be doing pretty well this regimen so will give her some refills and see her back in 2 months for her diabetes mellitus.

## 2015-02-08 NOTE — Progress Notes (Signed)
   Subjective:    Patient ID: [name] [name], female    DOB: Dec 25, 1950, 64 y.o.   MRN: [id number]  HPI #1. Here for discussion about gabapentin. She is not think it's significantly helping her peripheral neuropathy and she does think it's increasing her lower extremity edema. She wants to stop it. #2. Continues to have left flank pain is intermittent. Symptoms have not changed. They're not occurring anymore frequently and are not more severe. #3. Blindness. 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 Additional pertinent review of systems is negative for unusual weight change, fever, sweats, chills.    Objective:   Physical Exam Vital signs are reviewed GEN.: Well-developed female no acute distress EXTREMITY: Trace pitting edema bilateral lower extremities to mid shin. VASCULAR: Dorsalis pedis pulses and posterior tibial pulses are 1-2+ bilaterally equal. NEURO: Decreased sensation to soft touch and 2 point discrimination from mid shin down and from mid forearm down. This is her baseline exam.       Assessment & Plan:

## 2015-02-08 NOTE — Assessment & Plan Note (Signed)
She thinks the gabapentin is causing her problems with lower extremity swelling and she also doesn't think the gabapentin has been doing much for her peripheral neuropathy. We elected to discontinue all of her doses except her bedtime dose for the next 2 or 3 days. If she has significant increase in her lower extremity pain, that she needs to give me a call and we'll consider going back up on her dose. If her peripheral neuropathy pain does not increase with the decrease in dose, then I think it would be reasonable to try taking her off it entirely and seeing how she does. I like to follow her up for this in one month.

## 2015-02-08 NOTE — Patient Outreach (Signed)
[facility]) Care Management  [place]   02/08/2015  DANTE COOTER 1951/04/17 [id number]  Subjective: [name] [name] is a 64 y.o. female who is being followed by [place] for medication management,  I saw the patient at [place] today for her visit with Dr. Nori Riis.  She reports having concerns with the gabapentin. She reports that it causes swelling in her legs and that it does not help with her "pins and needles" pain. She further discussed this with Dr. Nori Riis during her visit.   She also reports that she thinks that the pill box that she has (provided by our [facility] social worker) is working well. She will still occasionally forget her medications but she thinks that her adherence has improved. She has been filling her pillbox herself and says that it takes a while but she believes that she is doing a pretty good job.   She denies being interested in the [employer] (a program that fills pill boxes on a sliding scale fee) due to cost.   She is interested in having me come out to her home to verify that she is filling her pillbox correctly.   Objective:   Current Medications: Current Outpatient Prescriptions  Medication Sig Dispense Refill  . acetaminophen-codeine (TYLENOL #3) 300-30 MG per tablet     . acyclovir (ZOVIRAX) 200 MG capsule TAKE 2 CAPSULES TWICE DAILY 360 capsule 3  . albuterol (PROVENTIL HFA;VENTOLIN HFA) 108 (90 BASE) MCG/ACT inhaler Inhale 2 puffs into the lungs every 6 (six) hours as needed for wheezing or shortness of breath. 3 Inhaler 3  . albuterol (PROVENTIL) (2.5 MG/3ML) 0.083% nebulizer solution Take 2.5 mg by nebulization every 6 (six) hours as needed for wheezing or shortness of breath.    . allopurinol (ZYLOPRIM) 300 MG tablet TAKE 1 TABLET EVERY DAY 90 tablet 3  . ammonium lactate (LAC-HYDRIN) 12 % lotion Apply 1 application topically 2 (two) times daily as needed for dry skin.    [name]  aspirin EC 81 MG tablet Take 81 mg by mouth daily.    [name] atorvastatin (LIPITOR) 80 MG tablet Take 1 tablet (80 mg total) by mouth daily. 90 tablet 3  . azelastine (ASTELIN) 0.1 % nasal spray USE 2 SPRAYS IN EACH NOSTRIL EVERY DAY AS NEEDED AS DIRECTED 60 mL 3  . baclofen (LIORESAL) 10 MG tablet Take 5-10 mg by mouth daily as needed for muscle spasms.    . beclomethasone (QVAR) 80 MCG/ACT inhaler Inhale 2 puffs into the lungs daily.    . bisacodyl (DULCOLAX) 5 MG EC tablet Take one or two tablets by mouth at bedtime prn constipation 60 tablet 1  . Blood Glucose Monitoring Suppl (ACCU-CHEK AVIVA PLUS) W/DEVICE KIT Use to check blood sugar 3-4 times daily 1 kit 0  . brimonidine (ALPHAGAN P) 0.1 % SOLN Place 1 drop into the left eye 2 (two) times daily.     . Dulaglutide (TRULICITY) 2.35 TI/1.4ER SOPN Inject 0.75 mg into the skin once a week. 4 pen 3  . EPINEPHrine 0.3 mg/0.3 mL IJ SOAJ injection Inject 0.3 mLs (0.3 mg total) into the muscle once as needed (for allergic reaction). 1 Device 3  . esomeprazole (NEXIUM) 40 MG capsule Take 1 capsule (40 mg total) by mouth daily. 90 capsule 3  . ferrous sulfate 325 (65 FE) MG tablet Take 325 mg by mouth daily.    . furosemide (LASIX) 40 MG tablet TAKE 1 TABLET  EVERY DAY 90 tablet 3  . gabapentin (NEURONTIN) 300 MG capsule 2 tabs every morning, 1 tab at noon, and 2 tabs every evening. (Patient taking differently: Take 300-600 mg by mouth 3 (three) times daily. 2 tabs every morning, 1 tab at noon, and 2 tabs every evening.) 450 capsule 3  . glucose blood test strip Test blood sugar 3-4 times daily 400 each 12  . guaiFENesin-codeine (ROBITUSSIN AC) 100-10 MG/5ML syrup Take 5 mLs by mouth 3 (three) times daily as needed for cough. 120 mL 0  . insulin aspart (NOVOLOG) 100 UNIT/ML FlexPen Inject 30 units with early meal and 30 units with late meal. Dispense Quantity sufficient. (Patient taking differently: Inject 30 Units into the skin 2 (two) times daily with a  meal. Inject 30 units with early meal and 30 units with late meal. Dispense Quantity sufficient.) 15 mL 5  . Insulin Glargine (LANTUS SOLOSTAR) 100 UNIT/ML Solostar Pen INJECT  65 UNITS SUBCUTANEOUSLY EVERY DAY 65 mL 3  . Lancets MISC Use twice or three times daily to check blood sugar 200 each 12  . latanoprost (XALATAN) 0.005 % ophthalmic solution Place 1 drop into both eyes at bedtime.    [name] losartan (COZAAR) 100 MG tablet TAKE 1 TABLET EVERY DAY 90 tablet 3  . metoprolol succinate (TOPROL-XL) 50 MG 24 hr tablet Take 3 tablets (150 mg total) by mouth daily. Take with or immediately following a meal. 270 tablet 3  . montelukast (SINGULAIR) 10 MG tablet TAKE 1 TABLET EVERY DAY 90 tablet 3  . Multiple Vitamins-Minerals (MULTIVITAMIN PO) Take 1 tablet by mouth daily.    . nitroGLYCERIN (NITROSTAT) 0.4 MG SL tablet Place 0.4 mg under the tongue every 5 (five) minutes as needed for chest pain.    . salicylic acid 17 % gel Apply topically 2 (two) times daily. Apply small amount to callous area on bottom of foot BID (Patient taking differently: Apply 1 application topically as needed (for foot). Apply small amount to callous area on bottom of foot BID) 14 g 3  . sertraline (ZOLOFT) 100 MG tablet TAKE 1 AND 1/2 TABLETS AT BEDTIME (Patient taking differently: TAKE 1 AND 1/2 TABLETS (150MG) AT BEDTIME) 135 tablet 3  . spironolactone (ALDACTONE) 25 MG tablet Take 1 tablet (25 mg total) by mouth daily before supper. 90 tablet 3  . Vitamin D, Ergocalciferol, (DRISDOL) 50000 UNITS CAPS capsule Take 1 capsule (50,000 Units total) by mouth every 30 (thirty) days. Take on 3rd of the month     No current facility-administered medications for this visit.    Functional Status: In your present state of health, do you have any difficulty performing the following activities: 01/26/2015 01/12/2015  Hearing? N N  Vision? Y Y  Difficulty concentrating or making decisions? N N  Walking or climbing stairs? N Y  Dressing  or bathing? N N  Doing errands, shopping? N N  Preparing Food and eating ? N N  Using the Toilet? N N  In the past six months, have you accidently leaked urine? N N  Do you have problems with loss of bowel control? N N  Managing your Medications? Y Y  Managing your Finances? N N  Housekeeping or managing your Housekeeping? [name]    Fall/Depression Screening: PHQ 2/9 Scores 02/08/2015 01/26/2015 01/12/2015 12/21/2014 12/05/2014 10/26/2014 08/10/2014  PHQ - 2 Score 0 0 0 0 1 0 0    Assessment: 1. Medication management: patient in need of assistance with her home management  of her medications. She has a pill box but would like me to verify that she is filling it correctly. She has visual impairment which could be the reason it is taking her so long to fill her pill box. 2. Issues with gabapentin: patient not tolerating the gabapentin well due to swelling and lack of efficacy per patient report.   Plan: 1. Medication management: scheduled a home visit with patient tomorrow 02/09/15 at 11AM to evaluate her pill box. Patient verbalized understanding.  2. Issues with gabapentin: defer to Dr. Nori Riis.   Nicoletta Ba, PharmD, [place] Resident [place] [phone]

## 2015-02-08 NOTE — Assessment & Plan Note (Signed)
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

## 2015-02-09 ENCOUNTER — Ambulatory Visit (INDEPENDENT_AMBULATORY_CARE_PROVIDER_SITE_OTHER): Payer: Commercial Managed Care - HMO

## 2015-02-09 ENCOUNTER — Other Ambulatory Visit: Payer: Self-pay | Admitting: Pharmacist

## 2015-02-09 DIAGNOSIS — J309 Allergic rhinitis, unspecified: Secondary | ICD-10-CM | POA: Diagnosis not present

## 2015-02-09 NOTE — Patient Outreach (Signed)
[facility]) Care Management  [place]   02/09/2015  [name] [name] 07-14-1951 [id number]  Subjective: [name] [name] is a 64 y.o. female who is being followed by [place] for medication management.  I went to the patient's home today for medication management. The patient has a monthly pill box that has an alarm to remind her to take her medications. She wanted me to look through her medications and make sure that she filled the pill box correctly.  I went through each day and verified the medications. I also removed all of the morning and afternoon doses of gabapentin per the patient's request since she is only to be taking it at night now.  She reports that she feels comfortable filling her pill box. She also helps her son and her father with their pill boxes. She reports that it takes her some time since she has difficulty seeing.   Objective:   Current Medications: Current Outpatient Prescriptions  Medication Sig Dispense Refill  . acyclovir (ZOVIRAX) 200 MG capsule TAKE 2 CAPSULES TWICE DAILY 360 capsule 3  . albuterol (PROVENTIL HFA;VENTOLIN HFA) 108 (90 BASE) MCG/ACT inhaler Inhale 2 puffs into the lungs every 6 (six) hours as needed for wheezing or shortness of breath. 3 Inhaler 3  . albuterol (PROVENTIL) (2.5 MG/3ML) 0.083% nebulizer solution Take 2.5 mg by nebulization every 6 (six) hours as needed for wheezing or shortness of breath.    . allopurinol (ZYLOPRIM) 300 MG tablet TAKE 1 TABLET EVERY DAY 90 tablet 3  . aspirin EC 81 MG tablet Take 81 mg by mouth daily.    [name] atorvastatin (LIPITOR) 80 MG tablet Take 1 tablet (80 mg total) by mouth daily. 90 tablet 3  . azelastine (ASTELIN) 0.1 % nasal spray USE 2 SPRAYS IN EACH NOSTRIL EVERY DAY AS NEEDED AS DIRECTED 60 mL 3  . beclomethasone (QVAR) 80 MCG/ACT inhaler Inhale 2 puffs into the lungs daily.    . Blood Glucose Monitoring Suppl (ACCU-CHEK AVIVA PLUS) W/DEVICE KIT Use to check blood sugar  3-4 times daily 1 kit 0  . brimonidine (ALPHAGAN P) 0.1 % SOLN Place 1 drop into the left eye 2 (two) times daily.     [name] EPINEPHrine 0.3 mg/0.3 mL IJ SOAJ injection Inject 0.3 mLs (0.3 mg total) into the muscle once as needed (for allergic reaction). 1 Device 3  . esomeprazole (NEXIUM) 40 MG capsule Take 1 capsule (40 mg total) by mouth daily. 90 capsule 3  . ferrous sulfate 325 (65 FE) MG tablet Take 325 mg by mouth daily.    . furosemide (LASIX) 40 MG tablet TAKE 1 TABLET EVERY DAY 90 tablet 3  . gabapentin (NEURONTIN) 300 MG capsule 2 tabs every morning, 1 tab at noon, and 2 tabs every evening. (Patient taking differently: Take 300-600 mg by mouth 3 (three) times daily. 2 tabs every morning, 1 tab at noon, and 2 tabs every evening.) 450 capsule 3  . glucose blood test strip Test blood sugar 3-4 times daily 400 each 12  . insulin aspart (NOVOLOG) 100 UNIT/ML FlexPen Inject 30 units with early meal and 30 units with late meal. Dispense Quantity sufficient. (Patient taking differently: Inject 30 Units into the skin 2 (two) times daily with a meal. Inject 30 units with early meal and 30 units with late meal. Dispense Quantity sufficient.) 15 mL 5  . Insulin Glargine (LANTUS SOLOSTAR) 100 UNIT/ML Solostar Pen INJECT  65 UNITS SUBCUTANEOUSLY EVERY DAY 65 mL  3  . Lancets MISC Use twice or three times daily to check blood sugar 200 each 12  . latanoprost (XALATAN) 0.005 % ophthalmic solution Place 1 drop into both eyes at bedtime.    [name] losartan (COZAAR) 100 MG tablet TAKE 1 TABLET EVERY DAY 90 tablet 3  . metoprolol succinate (TOPROL-XL) 50 MG 24 hr tablet Take 3 tablets (150 mg total) by mouth daily. Take with or immediately following a meal. 270 tablet 3  . montelukast (SINGULAIR) 10 MG tablet TAKE 1 TABLET EVERY DAY 90 tablet 3  . Multiple Vitamins-Minerals (MULTIVITAMIN PO) Take 1 tablet by mouth daily.    . nitroGLYCERIN (NITROSTAT) 0.4 MG SL tablet Place 0.4 mg under the tongue every 5 (five)  minutes as needed for chest pain.    . salicylic acid 17 % gel Apply topically 2 (two) times daily. Apply small amount to callous area on bottom of foot BID (Patient taking differently: Apply 1 application topically as needed (for foot). Apply small amount to callous area on bottom of foot BID) 14 g 3  . sertraline (ZOLOFT) 100 MG tablet TAKE 1 AND 1/2 TABLETS AT BEDTIME (Patient taking differently: TAKE 1 AND 1/2 TABLETS (150MG) AT BEDTIME) 135 tablet 3  . spironolactone (ALDACTONE) 25 MG tablet Take 1 tablet (25 mg total) by mouth daily before supper. 90 tablet 3  . Vitamin D, Ergocalciferol, (DRISDOL) 50000 UNITS CAPS capsule Take 1 capsule (50,000 Units total) by mouth every 30 (thirty) days. Take on 3rd of the month    . acetaminophen-codeine (TYLENOL #3) 300-30 MG per tablet     . ammonium lactate (LAC-HYDRIN) 12 % lotion Apply 1 application topically 2 (two) times daily as needed for dry skin.    . baclofen (LIORESAL) 10 MG tablet Take 5-10 mg by mouth daily as needed for muscle spasms.    . bisacodyl (DULCOLAX) 5 MG EC tablet Take one or two tablets by mouth at bedtime prn constipation (Patient not taking: Reported on 02/09/2015) 60 tablet 1  . Dulaglutide (TRULICITY) 2.64 BR/8.3EN SOPN Inject 0.75 mg into the skin once a week. (Patient not taking: Reported on 02/09/2015) 4 pen 3  . guaiFENesin-codeine (ROBITUSSIN AC) 100-10 MG/5ML syrup Take 5 mLs by mouth 3 (three) times daily as needed for cough. (Patient not taking: Reported on 02/09/2015) 120 mL 0   No current facility-administered medications for this visit.    Functional Status: In your present state of health, do you have any difficulty performing the following activities: 01/26/2015 01/12/2015  Hearing? N N  Vision? Y Y  Difficulty concentrating or making decisions? N N  Walking or climbing stairs? N Y  Dressing or bathing? N N  Doing errands, shopping? N N  Preparing Food and eating ? N N  Using the Toilet? N N  In the past six  months, have you accidently leaked urine? N N  Do you have problems with loss of bowel control? N N  Managing your Medications? Y Y  Managing your Finances? N N  Housekeeping or managing your Housekeeping? [name]    Fall/Depression Screening: PHQ 2/9 Scores 02/08/2015 01/26/2015 01/12/2015 12/21/2014 12/05/2014 10/26/2014 08/10/2014  PHQ - 2 Score 0 0 0 0 1 0 0    Assessment: 1. Medication management: patient was fairly accurate in filling her own pill box and only one day was not accurate and was missing a few of her medications. She reports feeling comfortable filling her own pill box. Her adherence to the pills  in the box were 100% except for 02/08/15 which she acknowledged was due to her being away from home all day. She is able to see her medication bottles and read them to appropriately fill her pill box.    Plan: 1. Medication management: I checked pill box for the month and fixed the one error that I found. I also removed the patient's morning and afternoon doses of gabapentin since those were stopped by Dr. Nori Riis. Patient will continue to fill her own pill box. I told her if she got to a point that she didn't feel like she could do it anymore to contact me and we would look into the [facility]) for pill box fills. Patient verbalized understanding. Will close out of pharmacy program.    Nicoletta Ba, PharmD, [place] Resident [place] [phone]

## 2015-02-10 ENCOUNTER — Encounter: Payer: Self-pay | Admitting: Internal Medicine

## 2015-02-13 ENCOUNTER — Other Ambulatory Visit: Payer: Self-pay | Admitting: *Deleted

## 2015-02-16 ENCOUNTER — Ambulatory Visit (INDEPENDENT_AMBULATORY_CARE_PROVIDER_SITE_OTHER): Payer: Commercial Managed Care - HMO

## 2015-02-16 ENCOUNTER — Other Ambulatory Visit: Payer: Self-pay | Admitting: *Deleted

## 2015-02-16 VITALS — BP 102/58 | HR 76 | Resp 20 | Ht 61.0 in | Wt 197.0 lb

## 2015-02-16 DIAGNOSIS — J309 Allergic rhinitis, unspecified: Secondary | ICD-10-CM

## 2015-02-16 DIAGNOSIS — E108 Type 1 diabetes mellitus with unspecified complications: Principal | ICD-10-CM

## 2015-02-16 DIAGNOSIS — IMO0002 Reserved for concepts with insufficient information to code with codable children: Secondary | ICD-10-CM

## 2015-02-16 DIAGNOSIS — E1065 Type 1 diabetes mellitus with hyperglycemia: Secondary | ICD-10-CM

## 2015-02-17 NOTE — Patient Outreach (Signed)
[name]) Care Management  [name]  02/17/2015   [name] [name] 1951-03-19 [id number]  Subjective: Pt reports she was provided a med center for her meds by [facility] SW which helped,  taking her meds with no problems.  Pt states [facility] pharmacist reviewed her med center, removed Neurontin except for the pm dose as MD decreased it.    Pt states her sugar today was 171, had one high reading (304) in pm, was told in the past by MD to call if 300 or 400 which she has not done.   Pt states she went to the ED in March for low blood  sugar, knows what to look for.    Objective: Lungs clear, no edema.  View of pt's recorded blood sugars= 100-285 in am, 133-304 (one time) In pm.  Averages 7 day-199, 14 day- 225, 30 day- 208.     Filed Vitals:   02/16/15 0955  BP: 102/58  Pulse: 76  Resp: 20     Current Medications:  Current Outpatient Prescriptions  Medication Sig Dispense Refill  . acetaminophen (TYLENOL) 325 MG tablet Take 650 mg by mouth every 6 (six) hours as needed. As needed    . acetaminophen-codeine (TYLENOL #3) 300-30 MG per tablet     . acyclovir (ZOVIRAX) 200 MG capsule TAKE 2 CAPSULES TWICE DAILY 360 capsule 3  . albuterol (PROVENTIL HFA;VENTOLIN HFA) 108 (90 BASE) MCG/ACT inhaler Inhale 2 puffs into the lungs every 6 (six) hours as needed for wheezing or shortness of breath. 3 Inhaler 3  . albuterol (PROVENTIL) (2.5 MG/3ML) 0.083% nebulizer solution Take 2.5 mg by nebulization every 6 (six) hours as needed for wheezing or shortness of breath.    . allopurinol (ZYLOPRIM) 300 MG tablet TAKE 1 TABLET EVERY DAY 90 tablet 3  . ammonium lactate (LAC-HYDRIN) 12 % lotion Apply 1 application topically 2 (two) times daily as needed for dry skin.    [name] aspirin EC 81 MG tablet Take 81 mg by mouth daily.    [name] atorvastatin (LIPITOR) 80 MG tablet Take 1 tablet (80 mg total) by mouth daily. 90 tablet 3  . azelastine (ASTELIN) 0.1 % nasal spray USE 2 SPRAYS IN EACH NOSTRIL  EVERY DAY AS NEEDED AS DIRECTED 60 mL 3  . baclofen (LIORESAL) 10 MG tablet Take 5-10 mg by mouth daily as needed for muscle spasms.    . beclomethasone (QVAR) 80 MCG/ACT inhaler Inhale 2 puffs into the lungs daily.    . bisacodyl (DULCOLAX) 5 MG EC tablet Take one or two tablets by mouth at bedtime prn constipation 60 tablet 1  . Blood Glucose Monitoring Suppl (ACCU-CHEK AVIVA PLUS) W/DEVICE KIT Use to check blood sugar 3-4 times daily 1 kit 0  . brimonidine (ALPHAGAN P) 0.1 % SOLN Place 1 drop into the left eye 2 (two) times daily.     . Dulaglutide (TRULICITY) 8.84 ZY/6.0YT SOPN Inject 0.75 mg into the skin once a week. 4 pen 3  . EPINEPHrine 0.3 mg/0.3 mL IJ SOAJ injection Inject 0.3 mLs (0.3 mg total) into the muscle once as needed (for allergic reaction). 1 Device 3  . esomeprazole (NEXIUM) 40 MG capsule Take 1 capsule (40 mg total) by mouth daily. 90 capsule 3  . ferrous sulfate 325 (65 FE) MG tablet Take 325 mg by mouth daily.    . furosemide (LASIX) 40 MG tablet TAKE 1 TABLET EVERY DAY 90 tablet 3  . gabapentin (NEURONTIN) 300 MG capsule 2 tabs  every morning, 1 tab at noon, and 2 tabs every evening. (Patient taking differently: Take 300-600 mg by mouth 3 (three) times daily. 2 tabs every morning, 1 tab at noon, and 2 tabs every evening.) 450 capsule 3  . glucose blood test strip Test blood sugar 3-4 times daily 400 each 12  . guaiFENesin-codeine (ROBITUSSIN AC) 100-10 MG/5ML syrup Take 5 mLs by mouth 3 (three) times daily as needed for cough. 120 mL 0  . insulin aspart (NOVOLOG) 100 UNIT/ML FlexPen Inject 30 units with early meal and 30 units with late meal. Dispense Quantity sufficient. (Patient taking differently: Inject 30 Units into the skin 2 (two) times daily with a meal. Inject 30 units with early meal and 30 units with late meal. Dispense Quantity sufficient.) 15 mL 5  . Insulin Glargine (LANTUS SOLOSTAR) 100 UNIT/ML Solostar Pen INJECT  65 UNITS SUBCUTANEOUSLY EVERY DAY 65 mL 3  .  Lancets MISC Use twice or three times daily to check blood sugar 200 each 12  . latanoprost (XALATAN) 0.005 % ophthalmic solution Place 1 drop into both eyes at bedtime.    [name] losartan (COZAAR) 100 MG tablet TAKE 1 TABLET EVERY DAY 90 tablet 3  . metoprolol succinate (TOPROL-XL) 50 MG 24 hr tablet Take 3 tablets (150 mg total) by mouth daily. Take with or immediately following a meal. 270 tablet 3  . montelukast (SINGULAIR) 10 MG tablet TAKE 1 TABLET EVERY DAY 90 tablet 3  . Multiple Vitamins-Minerals (MULTIVITAMIN PO) Take 1 tablet by mouth daily.    . nitroGLYCERIN (NITROSTAT) 0.4 MG SL tablet Place 0.4 mg under the tongue every 5 (five) minutes as needed for chest pain.    [name] sertraline (ZOLOFT) 100 MG tablet TAKE 1 AND 1/2 TABLETS AT BEDTIME (Patient taking differently: TAKE 1 AND 1/2 TABLETS (150MG) AT BEDTIME) 135 tablet 3  . spironolactone (ALDACTONE) 25 MG tablet Take 1 tablet (25 mg total) by mouth daily before supper. 90 tablet 3  . Vitamin D, Ergocalciferol, (DRISDOL) 50000 UNITS CAPS capsule Take 1 capsule (50,000 Units total) by mouth every 30 (thirty) days. Take on 3rd of the month    . salicylic acid 17 % gel Apply topically 2 (two) times daily. Apply small amount to callous area on bottom of foot BID (Patient not taking: Reported on 02/16/2015) 14 g 3   No current facility-administered medications for this visit.      Assessment:  DM- fluctuation in blood sugars, review needed with s/s of high blood sugars.  Easy to read information on s/s of high  blood sugars provided as well as information on hypoglycemia (to review).     Plan:  Pt to continue to check blood sugars/record.            Pt to call MD if sugars high (300-400).              Plan to provide pt with community nurse case management services, f/u again 7/14- update care plan as                Needed.             Plan to inform Dr. Nori Riis of RN CM involvement.  [facility] CM Care Plan Problem One        Patient Outreach from  12/07/2014 in [place] CM Short Term Goal #1 Met Date  12/07/14   [facility] CM Short Term Goal #2 Met Date  12/07/14  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    Care Plan for Problem Two  Active   [facility] CM Short Term Goal #1 (0-30 days)  CSW will assist patient with completing an application for financial assistance to see if she is eligible to receive funds to purchase a talking medication box through [facility] CM., during routine home visit.   [facility] CM Short Term Goal #1 Start Date  01/12/15   [facility] CM Short Term Goal #1 Met Date   01/12/15   Interventions for Short Term Goal #2   CSW will submit patient's completed application for financial assistance to assistant director of [place] for review and determination of eligibility to purchase a talking medication box for patient for home use.   [facility] CM Short Term Goal #2 (0-30 days)  CSW will provide patient with a talking medication box to assist with proper medication dispensing due to patient's severe lack of visiion, within the next two weeks.   [facility] CM Short Term Goal #2 Start Date  01/12/15   [facility] CM Short Term Goal #2 Met Date  01/26/15   Interventions for Short Term Goal #2  CSW will deliver the talking medication box to patient's home during the next scheduled home visit and assist patient with learning proper use.    [place] Problem Three        Patient Outreach from 02/16/2015 in [place] Problem Three  Elevated blood sugars    Care Plan for Problem Three  Active   [facility] Long Term Goal (31-90) days  A1C would drop one point in the next 90 days    [facility] Long Term Goal Start Date  02/16/15   Interventions for Problem Three Long  Term Goal  Discussed with pt effect high blood sugars have on vital organs, provided pt with information on hyperglycemia    [facility] CM Short Term Goal #1 (0-30 days)  pt to cut back on food portions in the next 30 days    [facility] CM Short Term Goal #1 Start Date  02/16/15   Interventions for Short Term Goal #1  Discussed with pt  to include snack in between her 2 meals.    [facility] CM Short Term Goal #2 (0-30 days)  pt to call MD as instructed when sugars when high (300-400's) in the next 30 days    [facility] CM Short Term Goal #2 Start Date  02/16/15   Interventions for Short Term Goal #2  Discussed with pt importance of calling MD when sugars run high       Rose M.   [place] Care Management  [phone]

## 2015-02-22 ENCOUNTER — Other Ambulatory Visit: Payer: Self-pay | Admitting: Family Medicine

## 2015-02-22 ENCOUNTER — Telehealth: Payer: Self-pay | Admitting: Internal Medicine

## 2015-02-22 ENCOUNTER — Ambulatory Visit (INDEPENDENT_AMBULATORY_CARE_PROVIDER_SITE_OTHER): Payer: Commercial Managed Care - HMO

## 2015-02-22 DIAGNOSIS — J309 Allergic rhinitis, unspecified: Secondary | ICD-10-CM

## 2015-02-23 ENCOUNTER — Ambulatory Visit (INDEPENDENT_AMBULATORY_CARE_PROVIDER_SITE_OTHER): Payer: Commercial Managed Care - HMO

## 2015-02-23 ENCOUNTER — Ambulatory Visit (HOSPITAL_COMMUNITY): Payer: Commercial Managed Care - HMO | Admitting: Psychiatry

## 2015-02-23 DIAGNOSIS — J309 Allergic rhinitis, unspecified: Secondary | ICD-10-CM

## 2015-02-24 ENCOUNTER — Encounter: Payer: Self-pay | Admitting: Internal Medicine

## 2015-02-27 ENCOUNTER — Other Ambulatory Visit: Payer: Self-pay | Admitting: Family Medicine

## 2015-03-02 ENCOUNTER — Other Ambulatory Visit: Payer: Commercial Managed Care - HMO | Admitting: *Deleted

## 2015-03-02 ENCOUNTER — Ambulatory Visit (INDEPENDENT_AMBULATORY_CARE_PROVIDER_SITE_OTHER): Payer: Commercial Managed Care - HMO

## 2015-03-02 DIAGNOSIS — J309 Allergic rhinitis, unspecified: Secondary | ICD-10-CM | POA: Diagnosis not present

## 2015-03-02 NOTE — Patient Outreach (Signed)
[facility]) Care Management  03/02/2015  KANG ISHIDA [birth year] [id number]   CSW was scheduled to meet with patient today at 10am for a routine home visit; however, patient was unavailable at the time of CSW's arrival.  CSW left a HIPAA compliant message for patient on voicemail, encouraging patient to return CSW's call to reschedule the visit.  In CSW's message, CSW indicated to patient that CSW's next availability would be next Wednesday morning, providing the date and time.  Patient called the main [place] Management office, speaking with Freda Jackson, Care Management Assistant.  Patient reported that she was home all morning and did not hear anyone knocking on the door.  Mr. Evelene Croon reported that patient received CSW's message, indicating that she would not be available to meet with CSW next Wednesday, encouraging CSW to return her call.  CSW made a second outreach attempt, but again, was unsuccessful.  CSW is currently awaiting a return call from patient.  Nat Christen, BSW, MSW, [place] [facility] [place], [place], [place] [address] Di [email or web address] [phone]

## 2015-03-07 ENCOUNTER — Other Ambulatory Visit: Payer: Self-pay | Admitting: *Deleted

## 2015-03-07 NOTE — Patient Outreach (Signed)
[facility]) Care Management  03/07/2015  [name] [name] 24-Feb-1951 [id number]   CSW received a voicemail message from patient requesting to reschedule next routine home visit from July 7th at 10:00am to July 14th at 9:00am, as patient will be in [facility] the week of the 7th.  CSW attempted to return patient's call to confirm visit; however, CSW received patient's voicemail.  A HIPAA compliant message was left, explaining that CSW will make arrangements to meet with patient on July 14th at 9:00am, per patient's request.  Nat Christen, BSW, MSW, [place] [facility] [place], [place], [place] [address] Di [email or web address] [phone]

## 2015-03-09 ENCOUNTER — Ambulatory Visit: Payer: Commercial Managed Care - HMO | Admitting: *Deleted

## 2015-03-10 ENCOUNTER — Other Ambulatory Visit: Payer: Self-pay | Admitting: *Deleted

## 2015-03-10 NOTE — Patient Outreach (Signed)
Attempt made to contact pt on her cell phone  as saw in Epic pt contacted [facility] SW to reschedule home visit, [facility] SW to see pt 7/14 which is when RN CM relayed to pt  on last home visit- 6/16 to also see her 7/14.  HIPPA compliant voice message left.       Zara Chess.   [place] Care Management  [phone]

## 2015-03-10 NOTE — Patient Outreach (Signed)
Received a return phone call from pt to voice message left earlier by RN CM.   Discussed with pt saw in Epic note from [place] visit rescheduled for  7/14 which is same day RN CM to see her (last home visit noted, but not scheduled in Epic).   Pt states to  come back in town 7/11 and will call RN CM then, reschedule home visit.       Zara Chess.   [place] Care Management  [phone]

## 2015-03-15 ENCOUNTER — Other Ambulatory Visit: Payer: Self-pay | Admitting: *Deleted

## 2015-03-15 ENCOUNTER — Ambulatory Visit (HOSPITAL_COMMUNITY): Payer: Commercial Managed Care - HMO | Admitting: Psychiatry

## 2015-03-15 NOTE — Patient Outreach (Signed)
Attempt made to contact pt as this RN CM  Received a call from [place] earlier today  that pt reported to her blood sugars up, see if pt can be seen sooner than 7/20.    HIPPA compliant voice message left with contact number.    Zara Chess.   [place] Care Management  [phone]

## 2015-03-15 NOTE — Patient Outreach (Signed)
Pt called shortly after RN CM left voice message.   Pt reports blood sugars were running above 400 all last week, was out of town.  Pt reports most recent sugars are 7/10- 238, 7/12- 211, 7/13- today 207.    RN CM inquired of pt what her diet consisted of last week when out out town to which pt reports ate from the cafeteria ([employer], [employer], macaroni and cheese).   RN CM discussed with pt appears diet played a part in sugars running high.  Pt states she has not called MD, needs to f/u with pharmacist at [facility]- manages her insulin.    Plan to f/u with pt on 7/20 for home visit as previously agreed.    Zara Chess.   [place] Care Management  [phone]

## 2015-03-16 ENCOUNTER — Ambulatory Visit: Payer: Commercial Managed Care - HMO | Admitting: *Deleted

## 2015-03-16 ENCOUNTER — Ambulatory Visit (INDEPENDENT_AMBULATORY_CARE_PROVIDER_SITE_OTHER): Payer: Commercial Managed Care - HMO

## 2015-03-16 ENCOUNTER — Other Ambulatory Visit: Payer: Self-pay | Admitting: *Deleted

## 2015-03-16 DIAGNOSIS — J309 Allergic rhinitis, unspecified: Secondary | ICD-10-CM

## 2015-03-17 ENCOUNTER — Encounter: Payer: Self-pay | Admitting: *Deleted

## 2015-03-17 NOTE — Patient Outreach (Signed)
[name]) Care Management  [name] Social Work  03/17/2015  [name] [name] 1951/06/03 [id number]    Current Medications:  Current Outpatient Prescriptions  Medication Sig Dispense Refill  . acetaminophen (TYLENOL) 325 MG tablet Take 650 mg by mouth every 6 (six) hours as needed. As needed    . acetaminophen-codeine (TYLENOL #3) 300-30 MG per tablet     . acyclovir (ZOVIRAX) 200 MG capsule TAKE 2 CAPSULES TWICE DAILY 360 capsule 3  . albuterol (PROVENTIL HFA;VENTOLIN HFA) 108 (90 BASE) MCG/ACT inhaler Inhale 2 puffs into the lungs every 6 (six) hours as needed for wheezing or shortness of breath. 3 Inhaler 3  . albuterol (PROVENTIL) (2.5 MG/3ML) 0.083% nebulizer solution Take 2.5 mg by nebulization every 6 (six) hours as needed for wheezing or shortness of breath.    . allopurinol (ZYLOPRIM) 300 MG tablet TAKE 1 TABLET EVERY DAY 90 tablet 3  . ammonium lactate (LAC-HYDRIN) 12 % lotion Apply 1 application topically 2 (two) times daily as needed for dry skin.    [name] aspirin EC 81 MG tablet Take 81 mg by mouth daily.    [name] atorvastatin (LIPITOR) 80 MG tablet Take 1 tablet (80 mg total) by mouth daily. 90 tablet 3  . azelastine (ASTELIN) 0.1 % nasal spray USE 2 SPRAYS IN EACH NOSTRIL EVERY DAY AS NEEDED AS DIRECTED 60 mL 3  . baclofen (LIORESAL) 10 MG tablet Take 5-10 mg by mouth daily as needed for muscle spasms.    . beclomethasone (QVAR) 80 MCG/ACT inhaler Inhale 2 puffs into the lungs daily.    . bisacodyl (DULCOLAX) 5 MG EC tablet Take one or two tablets by mouth at bedtime prn constipation 60 tablet 1  . Blood Glucose Monitoring Suppl (ACCU-CHEK AVIVA PLUS) W/DEVICE KIT Use to check blood sugar 3-4 times daily 1 kit 0  . brimonidine (ALPHAGAN P) 0.1 % SOLN Place 1 drop into the left eye 2 (two) times daily.     . Dulaglutide (TRULICITY) 6.20 BT/5.9RC SOPN Inject 0.75 mg into the skin once a week. 4 pen 3  . EPINEPHrine 0.3 mg/0.3 mL IJ SOAJ injection Inject 0.3 mLs (0.3  mg total) into the muscle once as needed (for allergic reaction). 1 Device 3  . esomeprazole (NEXIUM) 40 MG capsule Take 1 capsule (40 mg total) by mouth daily. 90 capsule 3  . ferrous sulfate 325 (65 FE) MG tablet Take 325 mg by mouth daily.    . furosemide (LASIX) 40 MG tablet TAKE 1 TABLET EVERY DAY 90 tablet 3  . gabapentin (NEURONTIN) 300 MG capsule 2 tabs every morning, 1 tab at noon, and 2 tabs every evening. (Patient taking differently: Take 300-600 mg by mouth 3 (three) times daily. 2 tabs every morning, 1 tab at noon, and 2 tabs every evening.) 450 capsule 3  . glucose blood test strip Test blood sugar 3-4 times daily 400 each 12  . guaiFENesin-codeine (ROBITUSSIN AC) 100-10 MG/5ML syrup Take 5 mLs by mouth 3 (three) times daily as needed for cough. 120 mL 0  . Insulin Glargine (LANTUS SOLOSTAR) 100 UNIT/ML Solostar Pen INJECT  65 UNITS SUBCUTANEOUSLY EVERY DAY 65 mL 3  . Lancets MISC Use twice or three times daily to check blood sugar 200 each 12  . latanoprost (XALATAN) 0.005 % ophthalmic solution Place 1 drop into both eyes at bedtime.    [name] losartan (COZAAR) 100 MG tablet TAKE 1 TABLET EVERY DAY 90 tablet 3  . metoprolol succinate (TOPROL-XL) 50 MG  24 hr tablet Take 3 tablets (150 mg total) by mouth daily. Take with or immediately following a meal. 270 tablet 3  . montelukast (SINGULAIR) 10 MG tablet TAKE 1 TABLET EVERY DAY 90 tablet 3  . Multiple Vitamins-Minerals (MULTIVITAMIN PO) Take 1 tablet by mouth daily.    . nitroGLYCERIN (NITROSTAT) 0.4 MG SL tablet Place 0.4 mg under the tongue every 5 (five) minutes as needed for chest pain.    [name] NOVOLOG FLEXPEN 100 UNIT/ML FlexPen INJECT 30 UNITS SUBCUTANEOUSLY WITH THE EARLY MEAL AND 30 UNITS WITH THE LATE MEAL 60 mL 5  . salicylic acid 17 % gel Apply topically 2 (two) times daily. Apply small amount to callous area on bottom of foot BID (Patient not taking: Reported on 02/16/2015) 14 g 3  . sertraline (ZOLOFT) 100 MG tablet TAKE 1 AND 1/2  TABLETS AT BEDTIME (Patient taking differently: TAKE 1 AND 1/2 TABLETS (150MG) AT BEDTIME) 135 tablet 3  . spironolactone (ALDACTONE) 25 MG tablet Take 1 tablet (25 mg total) by mouth daily before supper. 90 tablet 3  . TRULICITY 1.24 PY/0.9XI SOPN INJECT 0.75 MG INTO THE SKIN ONCE A WEEK. 4 pen 12  . Vitamin D, Ergocalciferol, (DRISDOL) 50000 UNITS CAPS capsule Take 1 capsule (50,000 Units total) by mouth every 30 (thirty) days. Take on 3rd of the month     No current facility-administered medications for this visit.    Functional Status:  In your present state of health, do you have any difficulty performing the following activities: 03/17/2015 01/26/2015  Hearing? N N  Vision? Y Y  Difficulty concentrating or making decisions? N N  Walking or climbing stairs? N N  Dressing or bathing? N N  Doing errands, shopping? N N  Preparing Food and eating ? N N  Using the Toilet? N N  In the past six months, have you accidently leaked urine? N N  Do you have problems with loss of bowel control? N N  Managing your Medications? Y Y  Managing your Finances? N N  Housekeeping or managing your Housekeeping? [name] [name]    Fall/Depression Screening:  PHQ 2/9 Scores 03/17/2015 02/08/2015 01/26/2015 01/12/2015 12/21/2014 12/05/2014 10/26/2014  PHQ - 2 Score 0 0 0 0 0 1 0    Assessment:   CSW was able to meet with patient today to perform a routine home visit.  Patient talked a lot about her recent trip to a church [place] [name] and how the weather was comfortable, being [place] the [place], the scenery was beautiful, the food was delicious and the fellowship was great.  Patient reported that she will be making [facility] a yearly occurrence.  Patient also admitted to enjoying the much needed break from her son, as patient stated, "He has been getting on my last nerve lately".  Patient reported that she is currently in need of additional ([employer]) [employer] passes to get to and from  her physician appointments.  CSW agreed to provide patient with another 10-ride punch card during the next routine home visit.  Patient further reported that she would really like to have another raised toilet seat for her bathroom, as the one that she currently has is rusted and worn.  CSW is aware that patient is unable to afford to buy a new toilet seat, as patient is on a very fixed income.  CSW was able to perform an FAF (Financial Assessment Form) on patient to determine that patient is eligible to receive financial  assistance to purchase the toilet seat with a $50 gift card that CSW will provide to her through the [place] with [place] [facility].  The toilet seat will also be delivered to patient during the next routine home visit.  No additional equipment needs have been identified at this time.  CSW was willing to reschedule her home visit with patient today so that patient could be seen by her RNCM with [place] Management, Kathie Rhodes, after patient mentioned to CSW via a previous phone conversation, that her blood sugars have been "out of whack" since before she left for vacation.  Mrs. Veleta Miners was unable to meet with patient this morning, but offered to meet with patient later in the afternoon.  Patient declined, reporting that she would need to go and receive her injection later in the day.  Mrs. Veleta Miners agreed to contact patient directly to schedule the next home visit.  Plan:   CSW will plan to meet with patient for the next routine home visit, scheduled for Thursday, July 21st at [place] will deliver patient's [employer]) passes, as well patient's newly purchased raised toilet seat, to patient's home on Thursday, July 21st at 8:00am, during the next scheduled home visit. CSW will contact patient's RNCM with [place] Management, Kathie Rhodes to report patient's  concerns regarding her recent blood sugar readings while on vacation. CSW will fax a correspondence letter to patient's Primary Care Physician, Dr. [name], to ensure that Dr. Nori Riis is aware of CSW's involvement with patient.  Nat Christen, BSW, MSW, [facility] [place], [place], [place] [address] Di [email or web address] [phone]

## 2015-03-17 NOTE — Telephone Encounter (Signed)
Date Mixed: 02/22/15 Vial: 2 Strength: 1:10 Here/Mail/Pick Up: here Mixed By: tbs

## 2015-03-20 ENCOUNTER — Encounter: Payer: Self-pay | Admitting: Internal Medicine

## 2015-03-20 ENCOUNTER — Ambulatory Visit (INDEPENDENT_AMBULATORY_CARE_PROVIDER_SITE_OTHER): Payer: Commercial Managed Care - HMO | Admitting: Internal Medicine

## 2015-03-20 VITALS — BP 124/60 | HR 62 | Ht 61.0 in | Wt 201.4 lb

## 2015-03-20 DIAGNOSIS — R0602 Shortness of breath: Secondary | ICD-10-CM

## 2015-03-20 LAB — BASIC METABOLIC PANEL
BUN: 16 mg/dL (ref 6–23)
CO2: 31 mEq/L (ref 19–32)
Calcium: 10 mg/dL (ref 8.4–10.5)
Chloride: 100 mEq/L (ref 96–112)
Creatinine, Ser: 0.77 mg/dL (ref 0.40–1.20)
GFR: 97.41 mL/min (ref >60.00)
Glucose, Bld: 277 mg/dL — ABNORMAL HIGH (ref 70–99)
Potassium: 4.1 mEq/L (ref 3.5–5.1)
Sodium: 139 mEq/L (ref 135–145)

## 2015-03-20 LAB — BRAIN NATRIURETIC PEPTIDE: Pro B Natriuretic peptide (BNP): 15 pg/mL (ref 0.0–100.0)

## 2015-03-20 NOTE — Patient Instructions (Signed)
Medication Instructions:  Your physician recommends that you continue on your current medications as directed. Please refer to the Current Medication list given to you today.   Labwork: Lab work to be done today--BMP, BNP  Testing/Procedures: none  Follow-Up: Your physician wants you to follow-up in: January 2017. You will receive a reminder letter in the mail two months in advance. If you don't receive a letter, please call our office to schedule the follow-up appointment.

## 2015-03-20 NOTE — Progress Notes (Signed)
Cardiology Office Note   Date:  03/20/2015   ID:  [name] [name], DOB 1950-10-13, MRN [id number]  PCP:  [name], MD  Cardiologist:   Dorris Carnes, MD   No chief complaint on file.  Pt comes in for f/u of BP      History of Present Illness: [name] [name] is a 64 y.o. female with a history of  HTN and HL   I saw her in May of last year.  She had some jaw pain and chest tightness then  Set up for a cardiac catheterization.  This showed minimal CAD  Mild elevation of pulm pressures   LVEF normal  Since seen hs has followed closely with [facility]  She is weighing self and taking BP   Breathing is short on hot days  Deneis CP      Wt has been 196-201   BP 120s to 130s/  Chol is 104    Current Outpatient Prescriptions  Medication Sig Dispense Refill  . acetaminophen (TYLENOL) 325 MG tablet Take 650 mg by mouth every 6 (six) hours as needed (pain).     [name] acetaminophen-codeine (TYLENOL #3) 300-30 MG per tablet Take 1 tablet by mouth daily as needed (pain).     [name] acyclovir (ZOVIRAX) 200 MG capsule Take 200 mg by mouth 2 (two) times daily.    [name] albuterol (PROVENTIL HFA;VENTOLIN HFA) 108 (90 BASE) MCG/ACT inhaler Inhale 2 puffs into the lungs every 6 (six) hours as needed for wheezing or shortness of breath. 3 Inhaler 3  . albuterol (PROVENTIL) (2.5 MG/3ML) 0.083% nebulizer solution Take 2.5 mg by nebulization every 6 (six) hours as needed for wheezing or shortness of breath.    . allopurinol (ZYLOPRIM) 300 MG tablet TAKE 1 TABLET EVERY DAY (Patient taking differently: TAKE 1 TABLET BY MOUTH EVERY DAY) 90 tablet 3  . ammonium lactate (LAC-HYDRIN) 12 % lotion Apply 1 application topically 2 (two) times daily as needed for dry skin.    [name] aspirin EC 81 MG tablet Take 81 mg by mouth daily.    [name] atorvastatin (LIPITOR) 80 MG tablet Take 1 tablet (80 mg total) by mouth daily. 90 tablet 3  . azelastine (ASTELIN) 0.1 % nasal spray Place 2 sprays into both nostrils daily. Use in each nostril as  directed    . beclomethasone (QVAR) 80 MCG/ACT inhaler Inhale 2 puffs into the lungs daily.    . Blood Glucose Monitoring Suppl (ACCU-CHEK AVIVA PLUS) W/DEVICE KIT Use to check blood sugar 3-4 times daily 1 kit 0  . brimonidine (ALPHAGAN P) 0.1 % SOLN Place 1 drop into the left eye 2 (two) times daily.     . Dulaglutide (TRULICITY) 3.33 LK/5.6YB SOPN Inject 0.75 mg into the skin once a week. 4 pen 3  . EPINEPHrine 0.3 mg/0.3 mL IJ SOAJ injection Inject 0.3 mLs (0.3 mg total) into the muscle once as needed (for allergic reaction). 1 Device 3  . esomeprazole (NEXIUM) 40 MG capsule Take 1 capsule (40 mg total) by mouth daily. 90 capsule 3  . ferrous sulfate 325 (65 FE) MG tablet Take 325 mg by mouth daily.    . furosemide (LASIX) 40 MG tablet Take 40 mg by mouth 2 (two) times daily.    [name] gabapentin (NEURONTIN) 300 MG capsule Take 300 mg by mouth daily as needed (pain).    [name] glucose blood test strip Test blood sugar 3-4 times daily 400 each 12  . Insulin Glargine (LANTUS SOLOSTAR) 100  UNIT/ML Solostar Pen INJECT  65 UNITS SUBCUTANEOUSLY EVERY DAY 65 mL 3  . Lancets MISC Use twice or three times daily to check blood sugar 200 each 12  . latanoprost (XALATAN) 0.005 % ophthalmic solution Place 1 drop into both eyes at bedtime.    [name] losartan (COZAAR) 100 MG tablet TAKE 1 TABLET EVERY DAY (Patient taking differently: TAKE 1 TABLET BY MOUTH  EVERY DAY) 90 tablet 3  . metoprolol succinate (TOPROL-XL) 50 MG 24 hr tablet Take 3 tablets (150 mg total) by mouth daily. Take with or immediately following a meal. 270 tablet 3  . montelukast (SINGULAIR) 10 MG tablet TAKE 1 TABLET EVERY DAY (Patient taking differently: TAKE 1 TABLET BY MOUTH EVERY DAY) 90 tablet 3  . Multiple Vitamins-Minerals (MULTIVITAMIN PO) Take 1 tablet by mouth daily.    . nitroGLYCERIN (NITROSTAT) 0.4 MG SL tablet Place 0.4 mg under the tongue every 5 (five) minutes as needed for chest pain.    [name] NOVOLOG FLEXPEN 100 UNIT/ML FlexPen INJECT 30  UNITS SUBCUTANEOUSLY WITH THE EARLY MEAL AND 30 UNITS WITH THE LATE MEAL 60 mL 5  . salicylic acid 17 % gel Apply topically 2 (two) times daily. Apply small amount to callous area on bottom of foot BID 14 g 3  . sertraline (ZOLOFT) 100 MG tablet TAKE 1 AND 1/2 TABLETS AT BEDTIME (Patient taking differently: TAKE 1 AND 1/2 TABLETS (150MG) BY MOUTH  AT BEDTIME) 135 tablet 3  . spironolactone (ALDACTONE) 25 MG tablet Take 1 tablet (25 mg total) by mouth daily before supper. 90 tablet 3  . Vitamin D, Ergocalciferol, (DRISDOL) 50000 UNITS CAPS capsule Take 1 capsule (50,000 Units total) by mouth every 30 (thirty) days. Take on 3rd of the month     No current facility-administered medications for this visit.    Allergies:   Bee venom; Propoxyphene hcl; Amlodipine besylate; Hydrocodone; Lisinopril; Metformin and related; Metronidazole; and Valsartan   Past Medical History  Diagnosis Date  . Chronic cough     Now followed by pulmonary  . Sleep apnea     on CPAP 12  . HTN (hypertension)   . Dyslipidemia   . Gout   . History of colonoscopy   . Concussion 11/18/11    "fell at dr's office; hit head"  . Complication of anesthesia     "just wake up coughing; that's all"  . Heart murmur   . Asthma   . COPD (chronic obstructive pulmonary disease)   . Diabetes mellitus type II     "take insulin & pills"  . Blood transfusion   . GERD (gastroesophageal reflux disease)   . Arthritis   . Chronic lower back pain   . Vocal cord paralysis, unilateral partial     "right"  . Depression   . Peripheral neuropathy   . Angina   . Pneumonia     "i've had it once" (11/18/11)  . Exertional dyspnea   . Headache(784.0) 11/18/11    "I've had mild headaches the last couple days"  . Headache(784.0) 01/08/12    "pretty constant since 11/18/11's concussion"  . Syncope 06/16/2012  . Allergy   . Anxiety   . Blood transfusion without reported diagnosis   . Cataract   . Glaucoma   . Myocardial infarction   .  Osteoporosis     Past Surgical History  Procedure Laterality Date  . Colonoscopy w/ biopsies  11/21/2010    adenoma polyp--no hi grade dysplasia Dr Collene Mares  . Treatment  fistula anal    . Breast reduction surgery  04-21-2003  . Tubal ligation  1987  . Cardiac catheterization  03-02-2004  . Bronchoscopy  07-2007  . Knee arthroscopy  08/2000    right  . Shoulder arthroscopy  08/2000    right  . T-score  09/02/04    -0.54 (low risk currently)  . Total abdominal hysterectomy  10-07-2000  . Cesarean section  1987  . Shoulder arthroscopy w/ rotator cuff repair  10/18/2003    left  . Cataract extraction  1955; 1979    bilateral; right eye  . Eye surgery    . Breast biopsy      bilaterally  . Breast cyst excision      right twice; left once  . Carpal tunnel release      left  . Anterior cervical decomp/discectomy fusion  01/15/2012    Procedure: ANTERIOR CERVICAL DECOMPRESSION/DISCECTOMY FUSION 3 LEVELS;  Surgeon: Eustace Moore, MD;  Location: [facility];  Service: Neurosurgery;  Laterality: N/A;  Anterior Cervical Decompression Discectomy Fusion Cerivcal three four, cervical four five, cervical five six, cervical six seven  . Left and right heart catheterization with coronary angiogram N/A 01/19/2014    Procedure: LEFT AND RIGHT HEART CATHETERIZATION WITH CORONARY ANGIOGRAM;  Surgeon: Sinclair Grooms, MD;  Location: [facility];  Service: Cardiovascular;  Laterality: N/A;  . Spine surgery       Social History:  The patient  reports that she quit smoking about 39 years ago. Her smoking use included Cigarettes. She has a .25 pack-year smoking history. She has never used smokeless tobacco. She reports that she does not drink alcohol or use illicit drugs.   Family History:  The patient's family history includes Diabetes in her mother. There is no history of Colon cancer.    ROS:  Please see the history of present illness. All other systems are reviewed and  Negative to the above problem except  as noted.    PHYSICAL EXAM: VS:  BP 124/60 mmHg  Pulse 62  Ht 5' 1" (1.549 m)  Wt 201 lb 6.4 oz (91.354 kg)  BMI 38.07 kg/m2  GEN: Well nourished, well developed, in no acute distress HEENT: normal Neck: no JVD, carotid bruits, or masses Cardiac: RRR; no murmurs, rubs, or gallops,no edema  Respiratory:  clear to auscultation bilaterally, normal work of breathing GI: soft, nontender, nondistended, + BS  No hepatomegaly  MS: no deformity Moving all extremities   Skin: warm and dry, no rash Neuro:  Strength and sensation are intact Psych: euthymic mood, full affect   EKG:  EKG is not ordered today.   Lipid Panel    Component Value Date/Time   CHOL 179 09/30/2014 1234   TRIG 203* 09/30/2014 1234   HDL 34* 09/30/2014 1234   CHOLHDL 5.3 09/30/2014 1234   VLDL 41* 09/30/2014 1234   LDLCALC 104* 09/30/2014 1234   LDLDIRECT 112* 02/09/2014 1216      Wt Readings from Last 3 Encounters:  03/20/15 201 lb 6.4 oz (91.354 kg)  02/16/15 197 lb (89.359 kg)  02/08/15 198 lb 1.6 oz (89.858 kg)      ASSESSMENT AND PLAN:  1   HTN  BP has been good.  She notes some swelling in legs at end of day  Good now.   Will check BMETand BNP today  Keep on same meds  2.  HL  Lipids are fair in Jan  I will readdress in Jan  May add    Current medicines are reviewed at length with the patient today.  The patient does not have concerns regarding medicines.  The following changes have been made:   Labs/ tests ordered today include: No orders of the defined types were placed in this encounter.     Disposition:   FU with me in Jan 2017  Signed, Dorris Carnes, MD  03/20/2015 9:37 AM    [facility] [place], [place], [place]  [address] Phone: [phone]; Fax: [phone]

## 2015-03-22 ENCOUNTER — Other Ambulatory Visit: Payer: Self-pay | Admitting: *Deleted

## 2015-03-22 VITALS — BP 120/60 | HR 68 | Resp 20 | Ht 61.0 in | Wt 198.2 lb

## 2015-03-22 DIAGNOSIS — E1149 Type 2 diabetes mellitus with other diabetic neurological complication: Secondary | ICD-10-CM

## 2015-03-22 NOTE — Patient Outreach (Signed)
[facility]) Care Management   03/22/2015  [name] [name] 1951-05-16 [id number]  [name] [name] is an 64 y.o. female  Subjective:  Pt  Reports she enjoyed herself at [facility], checked her sugars daily, had one high reading - 400 to which she f/u with the camp nurse.  Pt states she was told to f/u with her MD but pt states she is going to see Dr.  Valentina Lucks, pharmacist at [facility] 7/27.  Pt states saw heart MD 7/18.      Objective:   Filed Vitals:   03/22/15 1035  BP: 120/60  Pulse: 68  Resp: 20    ROS  Physical Exam  Constitutional: She is oriented to person, place, and time. She appears well-developed and well-nourished.  Cardiovascular: Normal rate.   Respiratory: Breath sounds normal.  GI: Soft.  Musculoskeletal: Normal range of motion.  Gait imbalance if get up too fast.   Neurological: She is alert and oriented to person, place, and time.  Skin: Skin is warm and dry.  Psychiatric: She has a normal mood and affect. Her behavior is normal. Judgment and thought content normal.    Current Medications:  Reviewed medications with pt.  Current Outpatient Prescriptions  Medication Sig Dispense Refill  . acetaminophen (TYLENOL) 325 MG tablet Take 650 mg by mouth every 6 (six) hours as needed (pain).     [name] acyclovir (ZOVIRAX) 200 MG capsule Take 200 mg by mouth 2 (two) times daily.    [name] albuterol (PROVENTIL HFA;VENTOLIN HFA) 108 (90 BASE) MCG/ACT inhaler Inhale 2 puffs into the lungs every 6 (six) hours as needed for wheezing or shortness of breath. 3 Inhaler 3  . albuterol (PROVENTIL) (2.5 MG/3ML) 0.083% nebulizer solution Take 2.5 mg by nebulization every 6 (six) hours as needed for wheezing or shortness of breath.    . allopurinol (ZYLOPRIM) 300 MG tablet TAKE 1 TABLET EVERY DAY (Patient taking differently: TAKE 1 TABLET BY MOUTH EVERY DAY) 90 tablet 3  . ammonium lactate (LAC-HYDRIN) 12 % lotion Apply 1 application topically 2 (two) times daily as  needed for dry skin.    [name] aspirin EC 81 MG tablet Take 81 mg by mouth daily.    [name] atorvastatin (LIPITOR) 80 MG tablet Take 1 tablet (80 mg total) by mouth daily. 90 tablet 3  . azelastine (ASTELIN) 0.1 % nasal spray Place 2 sprays into both nostrils daily. Use in each nostril as directed    . beclomethasone (QVAR) 80 MCG/ACT inhaler Inhale 2 puffs into the lungs daily.    . Blood Glucose Monitoring Suppl (ACCU-CHEK AVIVA PLUS) W/DEVICE KIT Use to check blood sugar 3-4 times daily 1 kit 0  . brimonidine (ALPHAGAN P) 0.1 % SOLN Place 1 drop into the left eye 2 (two) times daily.     . Dulaglutide (TRULICITY) 1.51 VO/1.6WV SOPN Inject 0.75 mg into the skin once a week. 4 pen 3  . esomeprazole (NEXIUM) 40 MG capsule Take 1 capsule (40 mg total) by mouth daily. 90 capsule 3  . ferrous sulfate 325 (65 FE) MG tablet Take 325 mg by mouth daily.    . furosemide (LASIX) 40 MG tablet Take 40 mg by mouth 2 (two) times daily.    [name] glucose blood test strip Test blood sugar 3-4 times daily 400 each 12  . Insulin Glargine (LANTUS SOLOSTAR) 100 UNIT/ML Solostar Pen INJECT  65 UNITS SUBCUTANEOUSLY EVERY DAY 65 mL 3  . Lancets MISC Use twice or three  times daily to check blood sugar 200 each 12  . latanoprost (XALATAN) 0.005 % ophthalmic solution Place 1 drop into both eyes at bedtime.    [name] losartan (COZAAR) 100 MG tablet TAKE 1 TABLET EVERY DAY (Patient taking differently: TAKE 1 TABLET BY MOUTH  EVERY DAY) 90 tablet 3  . metoprolol succinate (TOPROL-XL) 50 MG 24 hr tablet Take 3 tablets (150 mg total) by mouth daily. Take with or immediately following a meal. 270 tablet 3  . montelukast (SINGULAIR) 10 MG tablet TAKE 1 TABLET EVERY DAY (Patient taking differently: TAKE 1 TABLET BY MOUTH EVERY DAY) 90 tablet 3  . Multiple Vitamins-Minerals (MULTIVITAMIN PO) Take 1 tablet by mouth daily.    . nitroGLYCERIN (NITROSTAT) 0.4 MG SL tablet Place 0.4 mg under the tongue every 5 (five) minutes as needed for chest pain.     [name] NOVOLOG FLEXPEN 100 UNIT/ML FlexPen INJECT 30 UNITS SUBCUTANEOUSLY WITH THE EARLY MEAL AND 30 UNITS WITH THE LATE MEAL 60 mL 5  . salicylic acid 17 % gel Apply topically 2 (two) times daily. Apply small amount to callous area on bottom of foot BID 14 g 3  . sertraline (ZOLOFT) 100 MG tablet TAKE 1 AND 1/2 TABLETS AT BEDTIME (Patient taking differently: TAKE 1 AND 1/2 TABLETS (150MG) BY MOUTH  AT BEDTIME) 135 tablet 3  . spironolactone (ALDACTONE) 25 MG tablet Take 1 tablet (25 mg total) by mouth daily before supper. 90 tablet 3  . Vitamin D, Ergocalciferol, (DRISDOL) 50000 UNITS CAPS capsule Take 1 capsule (50,000 Units total) by mouth every 30 (thirty) days. Take on 3rd of the month    . acetaminophen-codeine (TYLENOL #3) 300-30 MG per tablet Take 1 tablet by mouth daily as needed (pain).     [name] EPINEPHrine 0.3 mg/0.3 mL IJ SOAJ injection Inject 0.3 mLs (0.3 mg total) into the muscle once as needed (for allergic reaction). (Patient not taking: Reported on 03/22/2015) 1 Device 3  . gabapentin (NEURONTIN) 300 MG capsule Take 300 mg by mouth daily as needed (pain).     No current facility-administered medications for this visit.    Functional Status:   In your present state of health, do you have any difficulty performing the following activities: 03/17/2015 01/26/2015  Hearing? N N  Vision? Y Y  Difficulty concentrating or making decisions? N N  Walking or climbing stairs? N N  Dressing or bathing? N N  Doing errands, shopping? N N  Preparing Food and eating ? N N  Using the Toilet? N N  In the past six months, have you accidently leaked urine? N N  Do you have problems with loss of bowel control? N N  Managing your Medications? Y Y  Managing your Finances? N N  Housekeeping or managing your Housekeeping? [name]    Fall/Depression Screening:    PHQ 2/9 Scores 03/17/2015 02/08/2015 01/26/2015 01/12/2015 12/21/2014 12/05/2014 10/26/2014  PHQ - 2 Score 0 0 0 0 0 1 0    Assessment:  DM-  Pt's blood  sugars flunctuate, pt reports used a different glucometer when at camp (had reading of 300, 430) but view of that glucometer did not show those readings.  Recorded readings range 133-332 am, 155-297 pm. Today's sugar 251, last night 188.   Plan:  Pt to continue to check sugars, record as well as call MD if elevated.            Pt to increase her exercise to 3-4 days a week (help  with diabetes, desire to loose weight)            Pt to f/u with Dr. Valentina Lucks 7/27, inquire about how insulin works.             RN CM to continue to provide community nurse case management services, next home visit 8/12.   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]   Care Plan for Problem One  Active   THN CM Short Term Goal #1 (0-30 days)  -- [CSW will obtain $50 gift card to purchase raised toilet seat]   THN CM Short Term Goal #1 Start Date  03/16/15   Interventions for Short Term Goal #1  -- [CSW will purchase raised toilet seat and deliver to patient]    [facility] CM Care Plan Problem Two        Patient Outreach from 01/26/2015 in [place] Problem Two  Patient has undergone a cataract extrraction, bilateral eye surgeries and carpal tunnel release which has 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    Care Plan for Problem Two  Active   THN CM Short Term Goal #1 (0-30 days)  CSW will assist patient with completing an application for financial assistance to see if she is eligible to receive funds to purchase a talking medication box through [facility] CM., during routine home visit.   THN CM Short Term Goal #1 Start Date  01/12/15   [facility] CM Short Term Goal #1 Met Date   01/12/15   Interventions for Short Term Goal #2   CSW will submit patient's completed application for financial assistance to assistant  director of [place] for review and determination of eligibility to purchase a talking medication box for patient for home use.   THN CM Short Term Goal #2 (0-30 days)  CSW will provide patient with a talking medication box to assist with proper medication dispensing due to patient's severe lack of visiion, within the next two weeks.   THN CM Short Term Goal #2 Start Date  01/12/15   [facility] CM Short Term Goal #2 Met Date  01/26/15   Interventions for Short Term Goal #2  CSW will deliver the talking medication box to patient's home during the next scheduled home visit and assist patient with learning proper use.    [place] Problem Three        Patient Outreach from 03/22/2015 in [place] Problem Three  Elevated blood sugars    Care Plan for Problem Three  Active   THN Long Term Goal (31-90) days  A1C would drop one point in the next 90 days    THN Long Term Goal Start Date  02/16/15   Interventions for Problem Three Long Term Goal  Discussed with pt effect high blood sugars have on vital organs, provided pt with information on hyperglycemia    THN CM Short Term Goal #1 (0-30 days)  pt to cut back on food portions in the next 30 days    THN CM Short Term Goal #1 Start Date  02/16/15   [facility] CM Short Term Goal #1 Met Date  03/22/15   Interventions for Short Term Goal #1  Discussed with pt  to include snack in between her 2 meals.    THN CM Short Term Goal #2 (0-30 days)  pt to call MD as instructed when sugars when high (300-400's) in the next 30 days    THN CM Short Term Goal #2 Start Date  02/16/15   THN CM Short Term Goal #2 Met Date  -- [not met, pt out of town for 6 days, ]   Interventions for Short Term Goal #2  Discussed with pt importance of calling MD when sugars run high     THN CM Short Term Goal #3 (0-30 days)  pt to increase exercise to 3-4 days a week within the next 30 days    THN  CM Short Term Goal #3 Start Date  03/22/15   Interventions for Short  Term Goal #3  Discussed with pt benefit of exercise with managing blood sugars      Zara Chess.   [place] Care Management  [phone]

## 2015-03-23 ENCOUNTER — Ambulatory Visit (INDEPENDENT_AMBULATORY_CARE_PROVIDER_SITE_OTHER): Payer: Medicare HMO | Admitting: Psychiatry

## 2015-03-23 ENCOUNTER — Ambulatory Visit (INDEPENDENT_AMBULATORY_CARE_PROVIDER_SITE_OTHER): Payer: Commercial Managed Care - HMO

## 2015-03-23 ENCOUNTER — Other Ambulatory Visit: Payer: Self-pay | Admitting: *Deleted

## 2015-03-23 ENCOUNTER — Encounter (HOSPITAL_COMMUNITY): Payer: Self-pay | Admitting: Psychiatry

## 2015-03-23 ENCOUNTER — Encounter: Payer: Self-pay | Admitting: *Deleted

## 2015-03-23 VITALS — BP 118/72 | HR 77 | Ht 60.0 in | Wt 201.4 lb

## 2015-03-23 DIAGNOSIS — F331 Major depressive disorder, recurrent, moderate: Secondary | ICD-10-CM | POA: Insufficient documentation

## 2015-03-23 DIAGNOSIS — J309 Allergic rhinitis, unspecified: Secondary | ICD-10-CM

## 2015-03-23 DIAGNOSIS — F411 Generalized anxiety disorder: Secondary | ICD-10-CM | POA: Diagnosis not present

## 2015-03-23 DIAGNOSIS — F339 Major depressive disorder, recurrent, unspecified: Secondary | ICD-10-CM | POA: Insufficient documentation

## 2015-03-23 MED ORDER — BUPROPION HCL 100 MG PO TABS: 100.0000 mg | ORAL_TABLET | Freq: Every day | ORAL | Status: DC

## 2015-03-23 NOTE — Addendum Note (Signed)
Addended by: Erin Sons D on: 03/23/2015 11:06 AM   Modules accepted: Level of Service

## 2015-03-23 NOTE — Progress Notes (Signed)
FOOT EXAM: (done under best practices tab and not showing up in note so my findings are reproduced here for exam done at this office visit) FEET: Bilaterally has loss of soft touch sensation  onplantar and dorsal skin of feet up to mid ankle. Callous formation especially under MT heads onplantar surface of both feet (R>) that is ttp.  VASC: DP and PT pulses 2+B=. Nails are thcikened and some deformity.

## 2015-03-23 NOTE — Progress Notes (Addendum)
Psychiatric Initial Adult Assessment   Patient Identification: [name] [name] MRN:  [id number] Date of Evaluation:  03/23/2015 Referral Source: PCP Dr. Byrd Hesselbach Chief Complaint:   depression and anxiety Visit Diagnosis:    ICD-9-CM ICD-10-CM   1. Major depressive disorder, recurrent episode, moderate 296.32 F33.1   2. Generalized anxiety disorder 300.02 F41.1    Diagnosis:   Patient Active Problem List   Diagnosis Date Noted  . Generalized anxiety disorder [F41.1] 03/23/2015    Priority: High  . Major depression, recurrent [F33.9] 03/23/2015    Priority: Medium  . Vitamin D deficiency [E55.9] 06/30/2014  . Bee sting allergy [Z91.038] 06/30/2014  . Pain in joint, ankle and foot [M25.579] 02/09/2014  . Edema [R60.9] 02/09/2014  . Chest pain radiating to jaw [R07.9] 01/19/2014  . Chronic headaches [R51] 12/27/2013  . 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 Callous ulcer [L98.499] 01/15/2013  . Chronic flank pain [R10.10, G89.29] 10/07/2012  . Fusion of spine of cervical region [facility] 06/03/2012  . Acquired cyst of kidney [N28.1] 05/28/2012  . Incomplete bladder emptying [R33.9] 05/28/2012  . Abdominal pain, chronic, generalized [R10.84, G89.29] 05/07/2012  . Vision loss, bilateral [H54.3] 02/05/2012  . Nail dystrophy [L60.3] 11/15/2011  . Hip arthritis [M19.90] 05/29/2011  . 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 Allergic rhinitis due to pollen [J30.1] 01/24/2011  . GAIT IMBALANCE [R26.9] 05/30/2010  . CARPAL TUNNEL SYNDROME, BILATERAL [G56.00] 12/28/2009  . HYPERLIPIDEMIA-MIXED [E78.5] 05/01/2009  . HERPES SIMPLEX INFECTION, RECURRENT [B00.9] 09/14/2008  . Neuropathy due to medical condition [G63] 06/08/2008  . NEURAL HEARING LOSS BILATERAL [H90.3] 03/02/2008  . Cataract extraction status [Z98.49] 09/10/2007  . 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 GERD [K21.9] 12/12/2006  . Diabetes mellitus type 2 with  neurological manifestations [E11.40] 10/30/2006  . Major depressive disorder, recurrent episode [F33.9] 10/30/2006  . Obstructive sleep apnea [G47.33] 10/30/2006  . Glaucoma [H40.9] 10/30/2006  . HYPERTENSION, BENIGN SYSTEMIC [I10] 10/30/2006   History of Present Illness:  64 year old African-American single female mother of a 93 year old son was referred to me by her PCP Dr. Nori Riis. Patient reports that she has been depressed for many years and has been on Zoloft since 2001 but lately she feels that it's no longer working.  She also has multiple health problems, along with social problems especially with her son who has recently come out as being bisexual and patient is experiencing a very difficult time adjusting to that. Her son lives with her and has ADHD and autism.  Patient is on Social Security disability as she was born with congenital cataracts, reports that her sleep is disturbed and she uses a CPAP machine, appetite tends to be excessive and she states that she has gained weight, mood is depressed but she talks about feeling anxious and ruminating and worrying about her son. She also states that she feels very tired and weak and has no motivation and no energy. Denies panic attacks, denies feeling hopeless and helpless. Denies suicidal or homicidal ideation and has no hallucinations or delusions.  Patient quit smoking cigarettes in 1980 does not use alcohol marijuana or other drugs.     Associated Signs/Symptoms: Depression Symptoms:  depressed mood, anhedonia, insomnia, psychomotor retardation, fatigue, feelings of worthlessness/guilt, difficulty concentrating, anxiety, loss of energy/fatigue, weight gain, increased appetite, (Hypo) Manic Symptoms:  None Anxiety Symptoms:  Excessive Worry, Psychotic Symptoms:  None PTSD Symptoms: NA  Past Medical History: Medical history was reviewed and all of her  listed problems are accurate and current. Past Medical History  Diagnosis  Date  . Chronic cough     Now followed by pulmonary  . Sleep apnea     on CPAP 12  . HTN (hypertension)   . Dyslipidemia   . Gout   . History of colonoscopy   . Concussion 11/18/11    "fell at dr's office; hit head"  . Complication of anesthesia     "just wake up coughing; that's all"  . Heart murmur   . Asthma   . COPD (chronic obstructive pulmonary disease)   . Diabetes mellitus type II     "take insulin & pills"  . Blood transfusion   . GERD (gastroesophageal reflux disease)   . Arthritis   . Chronic lower back pain   . Vocal cord paralysis, unilateral partial     "right"  . Depression   . Peripheral neuropathy   . Angina   . Pneumonia     "i've had it once" (11/18/11)  . Exertional dyspnea   . Headache(784.0) 11/18/11    "I've had mild headaches the last couple days"  . Headache(784.0) 01/08/12    "pretty constant since 11/18/11's concussion"  . Syncope 06/16/2012  . Allergy   . Anxiety   . Blood transfusion without reported diagnosis   . Cataract   . Glaucoma   . Myocardial infarction   . Osteoporosis     Past Surgical History  Procedure Laterality Date  . Colonoscopy w/ biopsies  11/21/2010    adenoma polyp--no hi grade dysplasia Dr Collene Mares  . Treatment fistula anal    . Breast reduction surgery  04-21-2003  . Tubal ligation  1987  . Cardiac catheterization  03-02-2004  . Bronchoscopy  07-2007  . Knee arthroscopy  08/2000    right  . Shoulder arthroscopy  08/2000    right  . T-score  09/02/04    -0.54 (low risk currently)  . Total abdominal hysterectomy  10-07-2000  . Cesarean section  1987  . Shoulder arthroscopy w/ rotator cuff repair  10/18/2003    left  . Cataract extraction  1955; 1979    bilateral; right eye  . Eye surgery    . Breast biopsy      bilaterally  . Breast cyst excision      right twice; left once  . Carpal tunnel release      left  . Anterior cervical decomp/discectomy fusion  01/15/2012    Procedure: ANTERIOR CERVICAL  DECOMPRESSION/DISCECTOMY FUSION 3 LEVELS;  Surgeon: Eustace Moore, MD;  Location: [facility];  Service: Neurosurgery;  Laterality: N/A;  Anterior Cervical Decompression Discectomy Fusion Cerivcal three four, cervical four five, cervical five six, cervical six seven  . Left and right heart catheterization with coronary angiogram N/A 01/19/2014    Procedure: LEFT AND RIGHT HEART CATHETERIZATION WITH CORONARY ANGIOGRAM;  Surgeon: Sinclair Grooms, MD;  Location: [facility] CATH LAB;  Service: Cardiovascular;  Laterality: N/A;  . Spine surgery     Family History: Son has autism and ADHD. Family History  Problem Relation Age of Onset  . Diabetes Mother   . Colon cancer Neg Hx    Social History:  She lives with her 64 year old son in [place] and has SSDI. Patient has never been married History   Social History  . Marital Status: Single    Spouse Name: N/A  . Number of Children: 1  . Years of Education: 12   Occupational History  .  unemployed    Social History Main Topics  . Smoking status: Former Smoker -- 0.50 packs/day for .5 years    Types: Cigarettes    Quit date: 09/03/1975  . Smokeless tobacco: Never Used  . Alcohol Use: No  . Drug Use: No  . Sexual Activity: Not Currently   Other Topics Concern  . None   Social History Narrative   Approved for section 8 housing (12/03).         Health Care POA:    Emergency Contact: sister, catherine Milner [phone]   End of Life Plan:    Who lives with you: Has a son who may be autistic and stays with her at times.   Any pets: none   Diet: Pt has a varied diet of protein, starch, and vegetables.   Exercise: Pt walks 1x a week with [place] [facility].   Seatbelts: Pt reports wearing seatbelt when in vehicles.    Hobbies: walking, tv, church, eating         Additional Social History: She was born and raised in [place]. Patient was born with bilateral congenital  cataracts and had her first surgery at age 66 and her second surgery in 53. She finished high school and subsequently worked. She was never married and had her son at the age of 36 raised him as a single mother.  Musculoskeletal: Strength & Muscle Tone: within normal limits Gait & Station: unsteady  patient uses a walker Patient leans: Front  Psychiatric Specialty Exam: HPI  Review of Systems  Constitutional: Positive for malaise/fatigue. Negative for fever, chills, weight loss and diaphoresis.  HENT: Positive for hearing loss. Negative for congestion, ear discharge, ear pain, nosebleeds, sore throat and tinnitus.   Eyes: Positive for blurred vision. Negative for double vision, photophobia, pain, discharge and redness.  Respiratory: Positive for shortness of breath. Negative for cough, hemoptysis, sputum production, wheezing and stridor.   Cardiovascular: Positive for palpitations. Negative for chest pain, orthopnea, claudication, leg swelling and PND.  Gastrointestinal: Positive for heartburn and constipation. Negative for nausea, vomiting, abdominal pain, diarrhea, blood in stool and melena.  Genitourinary: Negative for dysuria, urgency, frequency and flank pain.  Musculoskeletal: Positive for myalgias, back pain, joint pain and neck pain. Negative for falls.  Skin: Negative for itching and rash.  Neurological: Positive for sensory change, focal weakness, weakness and headaches. Negative for dizziness, tingling, tremors, speech change, seizures and loss of consciousness.  Endo/Heme/Allergies: Negative for environmental allergies and polydipsia. Does not bruise/bleed easily.  Psychiatric/Behavioral: Positive for depression. The patient is nervous/anxious and has insomnia.     Blood pressure 118/72, pulse 77, height 5' (1.524 m), weight 201 lb 6.4 oz (91.354 kg).Body mass index is 39.33 kg/(m^2).  General Appearance: Casual  Eye Contact:  Good patient has glaucoma and has difficulty seeing  despite her glasses.   Speech:  Clear and Coherent and Normal Rate  Volume:  Normal  Mood:  Anxious and Depressed  Affect:  Constricted and Depressed  Thought Process:  Goal Directed, Linear and Logical  Orientation:  Full (Time, Place, and Person)  Thought Content:  Rumination  Suicidal Thoughts:  No  Homicidal Thoughts:  No  Memory:  Immediate;   Good Recent;   Fair Remote;   Good  Judgement:  Good  Insight:  Good  Psychomotor Activity:  Normal  Concentration:  Fair  Recall:  Good  Fund of Knowledge:Good  Language: Good  Akathisia:  No  Handed:  Right  AIMS (if indicated):  0  Assets:  Communication Skills Desire for Improvement Physical Health Resilience Social Support  ADL's:  Impaired, patient walks with the help of a walker and has some difficulty with ADLs.   Cognition: WNL  Sleep:  4-5 hrs   Is the patient at risk to self?  No. Has the patient been a risk to self in the past 6 months?  No. Has the patient been a risk to self within the distant past?  No. Is the patient a risk to others?  No. Has the patient been a risk to others in the past 6 months?  No. Has the patient been a risk to others within the distant past?  No.  Allergies:   Allergies  Allergen Reactions  . Bee Venom Hives and Swelling  . Propoxyphene Hcl Itching  . Amlodipine Besylate Swelling  . Hydrocodone Other (See Comments)    With Vicodin - makes patient "jittery", and "hangover effect - sleepy next day"  . Lisinopril Cough    Changed to ARB  . Metformin And Related Diarrhea    GI distress  . Metronidazole Other (See Comments)    REACTION: "just didn't work; my body never did heal from it"  . Valsartan Other (See Comments)    REACTION:  "sleep more the next day after I took it; it made me real tired"   Current Medications: Current Outpatient Prescriptions  Medication Sig Dispense Refill  . acetaminophen (TYLENOL) 325 MG tablet Take 650 mg by mouth every 6 (six) hours as needed  (pain).     [name] acetaminophen-codeine (TYLENOL #3) 300-30 MG per tablet Take 1 tablet by mouth daily as needed (pain).     [name] acyclovir (ZOVIRAX) 200 MG capsule Take 200 mg by mouth 2 (two) times daily.    [name] albuterol (PROVENTIL HFA;VENTOLIN HFA) 108 (90 BASE) MCG/ACT inhaler Inhale 2 puffs into the lungs every 6 (six) hours as needed for wheezing or shortness of breath. 3 Inhaler 3  . albuterol (PROVENTIL) (2.5 MG/3ML) 0.083% nebulizer solution Take 2.5 mg by nebulization every 6 (six) hours as needed for wheezing or shortness of breath.    . allopurinol (ZYLOPRIM) 300 MG tablet TAKE 1 TABLET EVERY DAY (Patient taking differently: TAKE 1 TABLET BY MOUTH EVERY DAY) 90 tablet 3  . ammonium lactate (LAC-HYDRIN) 12 % lotion Apply 1 application topically 2 (two) times daily as needed for dry skin.    [name] aspirin EC 81 MG tablet Take 81 mg by mouth daily.    [name] atorvastatin (LIPITOR) 80 MG tablet Take 1 tablet (80 mg total) by mouth daily. 90 tablet 3  . azelastine (ASTELIN) 0.1 % nasal spray Place 2 sprays into both nostrils daily. Use in each nostril as directed    . beclomethasone (QVAR) 80 MCG/ACT inhaler Inhale 2 puffs into the lungs daily.    . Blood Glucose Monitoring Suppl (ACCU-CHEK AVIVA PLUS) W/DEVICE KIT Use to check blood sugar 3-4 times daily 1 kit 0  . brimonidine (ALPHAGAN P) 0.1 % SOLN Place 1 drop into the left eye 2 (two) times daily.     [name] buPROPion (WELLBUTRIN) 100 MG tablet Take 1 tablet (100 mg total) by mouth daily after breakfast. 30 tablet 2  . Dulaglutide (TRULICITY) 7.12 RF/7.5OI SOPN Inject 0.75 mg into the skin once a week. 4 pen 3  . EPINEPHrine 0.3 mg/0.3 mL IJ SOAJ injection Inject 0.3 mLs (0.3 mg total) into the muscle once  as needed (for allergic reaction). (Patient not taking: Reported on 03/22/2015) 1 Device 3  . esomeprazole (NEXIUM) 40 MG capsule Take 1 capsule (40 mg total) by mouth daily. 90 capsule 3  . ferrous sulfate 325 (65 FE) MG tablet Take 325 mg by mouth daily.     . furosemide (LASIX) 40 MG tablet Take 40 mg by mouth 2 (two) times daily.    [name] gabapentin (NEURONTIN) 300 MG capsule Take 300 mg by mouth daily as needed (pain).    [name] glucose blood test strip Test blood sugar 3-4 times daily 400 each 12  . Insulin Glargine (LANTUS SOLOSTAR) 100 UNIT/ML Solostar Pen INJECT  65 UNITS SUBCUTANEOUSLY EVERY DAY 65 mL 3  . Lancets MISC Use twice or three times daily to check blood sugar 200 each 12  . latanoprost (XALATAN) 0.005 % ophthalmic solution Place 1 drop into both eyes at bedtime.    [name] losartan (COZAAR) 100 MG tablet TAKE 1 TABLET EVERY DAY (Patient taking differently: TAKE 1 TABLET BY MOUTH  EVERY DAY) 90 tablet 3  . metoprolol succinate (TOPROL-XL) 50 MG 24 hr tablet Take 3 tablets (150 mg total) by mouth daily. Take with or immediately following a meal. 270 tablet 3  . montelukast (SINGULAIR) 10 MG tablet TAKE 1 TABLET EVERY DAY (Patient taking differently: TAKE 1 TABLET BY MOUTH EVERY DAY) 90 tablet 3  . Multiple Vitamins-Minerals (MULTIVITAMIN PO) Take 1 tablet by mouth daily.    . nitroGLYCERIN (NITROSTAT) 0.4 MG SL tablet Place 0.4 mg under the tongue every 5 (five) minutes as needed for chest pain.    [name] NOVOLOG FLEXPEN 100 UNIT/ML FlexPen INJECT 30 UNITS SUBCUTANEOUSLY WITH THE EARLY MEAL AND 30 UNITS WITH THE LATE MEAL 60 mL 5  . salicylic acid 17 % gel Apply topically 2 (two) times daily. Apply small amount to callous area on bottom of foot BID 14 g 3  . sertraline (ZOLOFT) 100 MG tablet TAKE 1 AND 1/2 TABLETS AT BEDTIME (Patient taking differently: TAKE 1 AND 1/2 TABLETS (150MG) BY MOUTH  AT BEDTIME) 135 tablet 3  . spironolactone (ALDACTONE) 25 MG tablet Take 1 tablet (25 mg total) by mouth daily before supper. 90 tablet 3  . Vitamin D, Ergocalciferol, (DRISDOL) 50000 UNITS CAPS capsule Take 1 capsule (50,000 Units total) by mouth every 30 (thirty) days. Take on 3rd of the month     No current facility-administered medications for this visit.     Previous Psychotropic Medications: Yes   Substance Abuse History in the last 12 months:  No.  Consequences of Substance Abuse: NA  Medical Decision Making:  New problem, with additional work up planned, Review of Psycho-Social Stressors (1), Review or order clinical lab tests (1), Review and summation of old records (2), Established Problem, Worsening (2), New Problem, with no additional work-up planned (3), Review of Medication Regimen & Side Effects (2) and Review of New Medication or Change in Dosage (2)  Treatment Plan Summary: Medication management #1 Maj. depression recurrent chronic moderate. Will be started on Wellbutrin 100 mg by mouth every morning. I discussed the rationale risks benefits options with the patient who gave me her informed consent. She'll be continued on Zoloft 150 mg by mouth every a.m. #2 generalized anxiety disorder. Will be treated with about her medications. Also discussed relaxations and deep breathing techniques with her and patient has agreed to practice them at home. #3 Multiple medical problems Will be treated by her PCP Dr. Judson Roch neal. #4 nutrition  Discussed the importance of nutrition and psychoeducation regarding nutrition given her multiple health problems was discussed with her. Patient stated understanding and also encouraged her to walk a little more so that she moves. #5 patient will return to see me in the clinic in 4 weeks for follow-up or earlier if necessary. At this time she is refusing a therapist due to transportation issues. Erin Sons 7/21/201610:20 AM

## 2015-03-23 NOTE — Patient Outreach (Signed)
[name] [name]) Care Management  [name] Social Work  03/23/2015  [name] [name] 05-05-1951 [id number]   Current Medications:  Current Outpatient Prescriptions  Medication Sig Dispense Refill  . acetaminophen (TYLENOL) 325 MG tablet Take 650 mg by mouth every 6 (six) hours as needed (pain).     [name] acetaminophen-codeine (TYLENOL #3) 300-30 MG per tablet Take 1 tablet by mouth daily as needed (pain).     [name] acyclovir (ZOVIRAX) 200 MG capsule Take 200 mg by mouth 2 (two) times daily.    [name] albuterol (PROVENTIL HFA;VENTOLIN HFA) 108 (90 BASE) MCG/ACT inhaler Inhale 2 puffs into the lungs every 6 (six) hours as needed for wheezing or shortness of breath. 3 Inhaler 3  . albuterol (PROVENTIL) (2.5 MG/3ML) 0.083% nebulizer solution Take 2.5 mg by nebulization every 6 (six) hours as needed for wheezing or shortness of breath.    . allopurinol (ZYLOPRIM) 300 MG tablet TAKE 1 TABLET EVERY DAY (Patient taking differently: TAKE 1 TABLET BY MOUTH EVERY DAY) 90 tablet 3  . ammonium lactate (LAC-HYDRIN) 12 % lotion Apply 1 application topically 2 (two) times daily as needed for dry skin.    [name] aspirin EC 81 MG tablet Take 81 mg by mouth daily.    [name] atorvastatin (LIPITOR) 80 MG tablet Take 1 tablet (80 mg total) by mouth daily. 90 tablet 3  . azelastine (ASTELIN) 0.1 % nasal spray Place 2 sprays into both nostrils daily. Use in each nostril as directed    . beclomethasone (QVAR) 80 MCG/ACT inhaler Inhale 2 puffs into the lungs daily.    . Blood Glucose Monitoring Suppl (ACCU-CHEK AVIVA PLUS) W/DEVICE KIT Use to check blood sugar 3-4 times daily 1 kit 0  . brimonidine (ALPHAGAN P) 0.1 % SOLN Place 1 drop into the left eye 2 (two) times daily.     [name] buPROPion (WELLBUTRIN) 100 MG tablet Take 1 tablet (100 mg total) by mouth daily after breakfast. 30 tablet 2  . Dulaglutide (TRULICITY) 0.86 VH/8.4ON SOPN Inject 0.75 mg into the skin once a week. 4 pen 3  . EPINEPHrine 0.3 mg/0.3 mL IJ SOAJ injection  Inject 0.3 mLs (0.3 mg total) into the muscle once as needed (for allergic reaction). (Patient not taking: Reported on 03/22/2015) 1 Device 3  . esomeprazole (NEXIUM) 40 MG capsule Take 1 capsule (40 mg total) by mouth daily. 90 capsule 3  . ferrous sulfate 325 (65 FE) MG tablet Take 325 mg by mouth daily.    . furosemide (LASIX) 40 MG tablet Take 40 mg by mouth 2 (two) times daily.    [name] gabapentin (NEURONTIN) 300 MG capsule Take 300 mg by mouth daily as needed (pain).    [name] glucose blood test strip Test blood sugar 3-4 times daily 400 each 12  . Insulin Glargine (LANTUS SOLOSTAR) 100 UNIT/ML Solostar Pen INJECT  65 UNITS SUBCUTANEOUSLY EVERY DAY 65 mL 3  . Lancets MISC Use twice or three times daily to check blood sugar 200 each 12  . latanoprost (XALATAN) 0.005 % ophthalmic solution Place 1 drop into both eyes at bedtime.    [name] losartan (COZAAR) 100 MG tablet TAKE 1 TABLET EVERY DAY (Patient taking differently: TAKE 1 TABLET BY MOUTH  EVERY DAY) 90 tablet 3  . metoprolol succinate (TOPROL-XL) 50 MG 24 hr tablet Take 3 tablets (150 mg total) by mouth daily. Take with or immediately following a meal. 270 tablet 3  . montelukast (SINGULAIR) 10 MG tablet TAKE 1 TABLET EVERY DAY (  Patient taking differently: TAKE 1 TABLET BY MOUTH EVERY DAY) 90 tablet 3  . Multiple Vitamins-Minerals (MULTIVITAMIN PO) Take 1 tablet by mouth daily.    . nitroGLYCERIN (NITROSTAT) 0.4 MG SL tablet Place 0.4 mg under the tongue every 5 (five) minutes as needed for chest pain.    [name] NOVOLOG FLEXPEN 100 UNIT/ML FlexPen INJECT 30 UNITS SUBCUTANEOUSLY WITH THE EARLY MEAL AND 30 UNITS WITH THE LATE MEAL 60 mL 5  . salicylic acid 17 % gel Apply topically 2 (two) times daily. Apply small amount to callous area on bottom of foot BID 14 g 3  . sertraline (ZOLOFT) 100 MG tablet TAKE 1 AND 1/2 TABLETS AT BEDTIME (Patient taking differently: TAKE 1 AND 1/2 TABLETS (150MG) BY MOUTH  AT BEDTIME) 135 tablet 3  . spironolactone (ALDACTONE) 25  MG tablet Take 1 tablet (25 mg total) by mouth daily before supper. 90 tablet 3  . Vitamin D, Ergocalciferol, (DRISDOL) 50000 UNITS CAPS capsule Take 1 capsule (50,000 Units total) by mouth every 30 (thirty) days. Take on 3rd of the month     No current facility-administered medications for this visit.    Functional Status:  In your present state of health, do you have any difficulty performing the following activities: 03/17/2015 01/26/2015  Hearing? N N  Vision? Y Y  Difficulty concentrating or making decisions? N N  Walking or climbing stairs? N N  Dressing or bathing? N N  Doing errands, shopping? N N  Preparing Food and eating ? N N  Using the Toilet? N N  In the past six months, have you accidently leaked urine? N N  Do you have problems with loss of bowel control? N N  Managing your Medications? Y Y  Managing your Finances? N N  Housekeeping or managing your Housekeeping? [name]    Fall/Depression Screening:  PHQ 2/9 Scores 03/17/2015 02/08/2015 01/26/2015 01/12/2015 12/21/2014 12/05/2014 10/26/2014  PHQ - 2 Score 0 0 0 0 0 1 0    Assessment:   CSW was only able to meet with patient briefly this morning to perform the routine home visit, as patient had forgotten that she already had an appointment scheduled with [name] at 10:00am today (Thursday, July 21st) when she scheduled her appointment with CSW for 8:30am.  Patient was already ready for the visit, but reported that the [place] would be buy to pick her up at anytime, indicating that they should arrive at her home between 8:30am-8:45am.  CSW was more than happy to reschedule the home visit, because CSW has been trying to get patient to attend her appointment with [name] for several months now.  Patient would schedule the appointments but then cancel at the last minute, indicating that she did not have the money for the co-pay. CSW reported that she would provide patient with her  SCAT passes (10-ride punch card) and raised toilet seat during the next home visit, needing to ensure that patient is well-educated on proper use of the home equipment.  Patient voiced understanding and was agreeable to this plan.  Patient agreed to follow-up with CSW after her intake assessment with a [facility] therapist to report findings of the visit and whether or not she plans to return.  CSW and patient were able to schedule their next routine home visit, which will take place on Wednesday, July 27th at 8:00am, per patient's request.  Plan:   CSW will meet  with patient for next routine home visit on Wednesday, July 27th at 8:00am to provide counseling and supportive services for symptoms of depression. CSW will fax a correspondence letter to patient's Primary Care Physician, Dr. [name] to ensure that Dr. Nori Riis is aware of CSW's involvement with patient.  Nat Christen, BSW, MSW, [place] [facility] [place], [place], [place] [address] [email or web address] [phone]

## 2015-03-24 ENCOUNTER — Ambulatory Visit: Payer: Commercial Managed Care - HMO | Admitting: Pharmacist

## 2015-03-29 ENCOUNTER — Encounter: Payer: Self-pay | Admitting: *Deleted

## 2015-03-29 ENCOUNTER — Other Ambulatory Visit: Payer: Self-pay | Admitting: *Deleted

## 2015-03-29 NOTE — Patient Outreach (Signed)
[facility]) Care Management  [facility] Social Work  03/29/2015  [name] [name] [birth year] [id number]    Current Medications:  Current Outpatient Prescriptions  Medication Sig Dispense Refill  . acetaminophen (TYLENOL) 325 MG tablet Take 650 mg by mouth every 6 (six) hours as needed (pain).     [name] acetaminophen-codeine (TYLENOL #3) 300-30 MG per tablet Take 1 tablet by mouth daily as needed (pain).     [name] acyclovir (ZOVIRAX) 200 MG capsule Take 200 mg by mouth 2 (two) times daily.    [name] albuterol (PROVENTIL HFA;VENTOLIN HFA) 108 (90 BASE) MCG/ACT inhaler Inhale 2 puffs into the lungs every 6 (six) hours as needed for wheezing or shortness of breath. 3 Inhaler 3  . albuterol (PROVENTIL) (2.5 MG/3ML) 0.083% nebulizer solution Take 2.5 mg by nebulization every 6 (six) hours as needed for wheezing or shortness of breath.    . allopurinol (ZYLOPRIM) 300 MG tablet TAKE 1 TABLET EVERY DAY (Patient taking differently: TAKE 1 TABLET BY MOUTH EVERY DAY) 90 tablet 3  . ammonium lactate (LAC-HYDRIN) 12 % lotion Apply 1 application topically 2 (two) times daily as needed for dry skin.    [name] aspirin EC 81 MG tablet Take 81 mg by mouth daily.    [name] atorvastatin (LIPITOR) 80 MG tablet Take 1 tablet (80 mg total) by mouth daily. 90 tablet 3  . azelastine (ASTELIN) 0.1 % nasal spray Place 2 sprays into both nostrils daily. Use in each nostril as directed    . beclomethasone (QVAR) 80 MCG/ACT inhaler Inhale 2 puffs into the lungs daily.    . Blood Glucose Monitoring Suppl (ACCU-CHEK AVIVA PLUS) W/DEVICE KIT Use to check blood sugar 3-4 times daily 1 kit 0  . brimonidine (ALPHAGAN P) 0.1 % SOLN Place 1 drop into the left eye 2 (two) times daily.     [name] buPROPion (WELLBUTRIN) 100 MG tablet Take 1 tablet (100 mg total) by mouth daily after breakfast. 30 tablet 2  . Dulaglutide (TRULICITY) 3.53 GD/9.2EQ SOPN Inject 0.75 mg into the skin once a week. 4 pen 3  . EPINEPHrine 0.3 mg/0.3 mL IJ SOAJ injection  Inject 0.3 mLs (0.3 mg total) into the muscle once as needed (for allergic reaction). (Patient not taking: Reported on 03/22/2015) 1 Device 3  . esomeprazole (NEXIUM) 40 MG capsule Take 1 capsule (40 mg total) by mouth daily. 90 capsule 3  . ferrous sulfate 325 (65 FE) MG tablet Take 325 mg by mouth daily.    . furosemide (LASIX) 40 MG tablet Take 40 mg by mouth 2 (two) times daily.    [name] gabapentin (NEURONTIN) 300 MG capsule Take 300 mg by mouth daily as needed (pain).    [name] glucose blood test strip Test blood sugar 3-4 times daily 400 each 12  . Insulin Glargine (LANTUS SOLOSTAR) 100 UNIT/ML Solostar Pen INJECT  65 UNITS SUBCUTANEOUSLY EVERY DAY 65 mL 3  . Lancets MISC Use twice or three times daily to check blood sugar 200 each 12  . latanoprost (XALATAN) 0.005 % ophthalmic solution Place 1 drop into both eyes at bedtime.    [name] losartan (COZAAR) 100 MG tablet TAKE 1 TABLET EVERY DAY (Patient taking differently: TAKE 1 TABLET BY MOUTH  EVERY DAY) 90 tablet 3  . metoprolol succinate (TOPROL-XL) 50 MG 24 hr tablet Take 3 tablets (150 mg total) by mouth daily. Take with or immediately following a meal. 270 tablet 3  . montelukast (SINGULAIR) 10 MG tablet TAKE 1 TABLET EVERY  DAY (Patient taking differently: TAKE 1 TABLET BY MOUTH EVERY DAY) 90 tablet 3  . Multiple Vitamins-Minerals (MULTIVITAMIN PO) Take 1 tablet by mouth daily.    . nitroGLYCERIN (NITROSTAT) 0.4 MG SL tablet Place 0.4 mg under the tongue every 5 (five) minutes as needed for chest pain.    [name] NOVOLOG FLEXPEN 100 UNIT/ML FlexPen INJECT 30 UNITS SUBCUTANEOUSLY WITH THE EARLY MEAL AND 30 UNITS WITH THE LATE MEAL 60 mL 5  . salicylic acid 17 % gel Apply topically 2 (two) times daily. Apply small amount to callous area on bottom of foot BID 14 g 3  . sertraline (ZOLOFT) 100 MG tablet TAKE 1 AND 1/2 TABLETS AT BEDTIME (Patient taking differently: TAKE 1 AND 1/2 TABLETS (150MG) BY MOUTH  AT BEDTIME) 135 tablet 3  . spironolactone (ALDACTONE) 25  MG tablet Take 1 tablet (25 mg total) by mouth daily before supper. 90 tablet 3  . Vitamin D, Ergocalciferol, (DRISDOL) 50000 UNITS CAPS capsule Take 1 capsule (50,000 Units total) by mouth every 30 (thirty) days. Take on 3rd of the month     No current facility-administered medications for this visit.    Functional Status:  In your present state of health, do you have any difficulty performing the following activities: 03/29/2015 03/17/2015  Hearing? N N  Vision? Y Y  Difficulty concentrating or making decisions? N N  Walking or climbing stairs? N N  Dressing or bathing? N N  Doing errands, shopping? N N  Preparing Food and eating ? N N  Using the Toilet? N N  In the past six months, have you accidently leaked urine? N N  Do you have problems with loss of bowel control? N N  Managing your Medications? Y Y  Managing your Finances? N N  Housekeeping or managing your Housekeeping? [name]    Fall/Depression Screening:  PHQ 2/9 Scores 03/29/2015 03/17/2015 02/08/2015 01/26/2015 01/12/2015 12/21/2014 12/05/2014  PHQ - 2 Score 0 0 0 0 0 0 1    Assessment:   CSW was able to meet with patient today to perform a routine home visit.  Also present during the meeting was patient's son, [name] [name].  CSW was able to deliver patient's raised toilet seat for home use, as well as fresh vegetables that CSW was able to obtain and a 10-ride punch card through [employer]  CSW was able to ensure that patient's new raised toilet seat fits properly over patient's current commode.  Patient now reports that she could really use a new 3-in-1 commode, as patient's current 3-in-1 commode is rusted and flimsy.  Patient received the previous 3-in-1 commode from [employer], a little over five years ago, so it is no longer under warranty.  CSW was able to converse with an [place] representative, who reports a new 3-in-1 commode can be purchased through their agency for  $13.00, after Medicare pays 80%.  CSW agreed to submit a request to patient's Primary Care Physician, Dr. [name], in hopes to obtain an order to purchase the equipment. Patient admitted to [place] that she has not been drinking the amount of water recommended by her physician, nor has patient been following her exercise routine.  Patient showed CSW her log for which she tracks her blood sugars, which have run consistently high over the last month.  Patient indicated that she has not been following her diet as directed.  Patient agreed to try harder, moving forward.  Patient has  been using her cane to ambulate in and around her home, and her walker when she leaves the home.  Patient has recently been started on a new psychotropic medication (Wellbutrin 16m), which she admits to taking exactly as prescribed.  Patient has noticed an increase in her energy level and believes that her mood has been enhanced.   Plan:   CSW will meet with patient for a routine home visit on Wednesday, August 17th at 8:00am to provide counseling and supportive services. CSW will converse with patient's Primary Care Physician, Dr. SDorcas Mcmurrayto request an order for patient to receive a 3-in-1 commode for home use.   JNat Christen BSW, MSW, [place] [id number] [email or web address] [phone]

## 2015-03-30 ENCOUNTER — Ambulatory Visit (INDEPENDENT_AMBULATORY_CARE_PROVIDER_SITE_OTHER): Payer: Commercial Managed Care - HMO

## 2015-03-30 DIAGNOSIS — J309 Allergic rhinitis, unspecified: Secondary | ICD-10-CM | POA: Diagnosis not present

## 2015-04-03 ENCOUNTER — Telehealth: Payer: Self-pay | Admitting: Family Medicine

## 2015-04-03 NOTE — Telephone Encounter (Signed)
Dear [name] Team Can u send this rx in for me I am sure dr. Andria Frames will sign it---below is message i received from Kay.  Hi Dr. Nori Riis,  May I please request an order for patient to receive a new 3-in-1 commode for home use. Patient's current 3-in-1 commode is greater than 63 years old, rusting and flimsy. [facility] can provide a new 3-in-1 commode for roughly $13.00 (which [facility] Care Management will cover the cost), after Medicare covers 80%. In addition to an order, I just need justification of home use. Thanks,   Nat Christen, BSW, MSW, [place] Management  [place], [place]  [place], [place] [address]  Di [email or web address]  [phone]  THANKS! [name]

## 2015-04-06 ENCOUNTER — Ambulatory Visit (INDEPENDENT_AMBULATORY_CARE_PROVIDER_SITE_OTHER): Payer: Commercial Managed Care - HMO

## 2015-04-06 DIAGNOSIS — J309 Allergic rhinitis, unspecified: Secondary | ICD-10-CM | POA: Diagnosis not present

## 2015-04-06 NOTE — Telephone Encounter (Signed)
Faxed [facility] the rx for the below request. Katharina Caper, April D, CMA

## 2015-04-06 NOTE — Telephone Encounter (Signed)
Will provide hand written Rx.

## 2015-04-10 ENCOUNTER — Other Ambulatory Visit: Payer: Self-pay | Admitting: *Deleted

## 2015-04-10 NOTE — Patient Outreach (Signed)
[facility]) Care Management  04/10/2015  [name] [name] Sep 30, 1950 [id number]   CSW received a message from patient while CSW was on vacation, requesting assistance with obtaining incontinence supplies for home use.  CSW made an attempt to try and contact patient today to follow-up regarding her recent request; however, patient was unavailable at the tine of CSW's call.  A HIPAA compliant message was left for patient on voicemail. CSW explained to patient that incontinence supplies are no longer available at a reduced cost through [employer], but that CSW would be able to provide patient with coupons to assist with purchase at a discounted rate.  CSW has had this conversation with patient in the past, but patient apparently did not remember.  CSW will provide patient with coupons at the next scheduled home visit, which will take place on August 17th. CSW further explained to patient that CSW received an order for the 3-in-1 commode from patient's Primary Care Physician, Dr. [name]; therefore, CSW will fax the order to [place] of choice, to obtain the equipment for patient.  CSW will await a return call from patient.  Nat Christen, BSW, MSW, [place] [facility] [place], [place], [place] [address] Di [email or web address] [phone]

## 2015-04-12 ENCOUNTER — Ambulatory Visit (HOSPITAL_COMMUNITY)
Admission: RE | Admit: 2015-04-12 | Discharge: 2015-04-12 | Disposition: A | Discharge status: Home / Self Care | Payer: Commercial Managed Care - HMO | Source: Ambulatory Visit | Attending: Family Medicine | Admitting: Family Medicine

## 2015-04-12 ENCOUNTER — Encounter: Payer: Self-pay | Admitting: Family Medicine

## 2015-04-12 ENCOUNTER — Ambulatory Visit (INDEPENDENT_AMBULATORY_CARE_PROVIDER_SITE_OTHER): Payer: Commercial Managed Care - HMO | Admitting: Family Medicine

## 2015-04-12 VITALS — BP 126/55 | HR 80 | Temp 97.8°F | Ht 61.0 in | Wt 199.1 lb

## 2015-04-12 DIAGNOSIS — M4322 Fusion of spine, cervical region: Secondary | ICD-10-CM

## 2015-04-12 DIAGNOSIS — E1149 Type 2 diabetes mellitus with other diabetic neurological complication: Secondary | ICD-10-CM

## 2015-04-12 DIAGNOSIS — I10 Essential (primary) hypertension: Secondary | ICD-10-CM | POA: Diagnosis not present

## 2015-04-12 DIAGNOSIS — E1140 Type 2 diabetes mellitus with diabetic neuropathy, unspecified: Secondary | ICD-10-CM

## 2015-04-12 DIAGNOSIS — R109 Unspecified abdominal pain: Secondary | ICD-10-CM

## 2015-04-12 DIAGNOSIS — G8929 Other chronic pain: Secondary | ICD-10-CM

## 2015-04-12 DIAGNOSIS — M542 Cervicalgia: Secondary | ICD-10-CM | POA: Diagnosis not present

## 2015-04-12 DIAGNOSIS — R1010 Upper abdominal pain, unspecified: Secondary | ICD-10-CM

## 2015-04-12 LAB — POCT GLYCOSYLATED HEMOGLOBIN (HGB A1C): Hemoglobin A1C: 8.6

## 2015-04-12 NOTE — Assessment & Plan Note (Signed)
We'll check her A1c today. Given her readings of a.m. blood sugars in the 140s and these encompass about 20 or 30% of her readings, Melillo hesitant to increase her insulin coverage. She evidently missed her last appointment with the diabetes pharmacy clinic so I will ask them their opinion about this. By then we'll also have her A1c information. I'll see her back for regular follow-up of her diabetes mellitus in 3 months.

## 2015-04-12 NOTE — Assessment & Plan Note (Signed)
Essentially unchanged. Still think this is related to either constipation or left hip arthritis. She has one try new pain medicine so were discussed, follow-up for now.

## 2015-04-12 NOTE — Progress Notes (Signed)
   Subjective:    Patient ID: [name] [name], female    DOB: 14-Feb-1951, 64 y.o.   MRN: [id number]  HPI #1. Follow diabetes mellitus. She still having occasional elevated blood sugars in the morning in the 200 range. Some of her other sugars are in the 140-160 range. She wonders if she needs to increase her evening Lantus. She's had no episodes of low blood sugar. She's not having any problems taking her medicines and she's tolerating the new medicine, trulicity, well. #2. Chronic left flank pain. This is essentially unchanged but it's still bothering her. She does not want to try any new pain medicines for this says they make her feel goofy. #3. She totally stopped her gabapentin to see if it would make any difference at her peripheral neuropathy and said it really didn't make any difference at all so she plans to stay off that most of the time. She does say that she wants to have a little bit on hand for when necessary use.   Review of Systems No unusual weight change, fever, sweats, chills. She's had no episodes of low blood sugar. No increase in thirst, no increased frequency of urination. No burning on urination. Continues to have numbness and burning in the feet and hands essentially unchanged.    Objective:   Physical Exam  Vital signs are reviewed GENERAL: Well-developed female no acute distress CV: Regular rate and rhythm LUNGS: Clear to auscultation bilaterally ABDOMEN: Soft, positive bowel sounds, no masses noted although this exam is limited somewhat by habitus, no rebound, no tenderness. The left flank area where she points to as the origin of her pain extends from the area right above her left hip around the flank in the direction of her lower rib area. It is nontender, not tender to percussion either.      Assessment & Plan:

## 2015-04-12 NOTE — Assessment & Plan Note (Signed)
She's got pretty good control her blood pressure. Her diastolics lower today than it has typically been but she is asymptomatic so I'm not going to change any of this. When I see her back in follow-up we'll check her kidney function.

## 2015-04-13 ENCOUNTER — Encounter: Payer: Self-pay | Admitting: Family Medicine

## 2015-04-13 ENCOUNTER — Ambulatory Visit (INDEPENDENT_AMBULATORY_CARE_PROVIDER_SITE_OTHER): Payer: Commercial Managed Care - HMO

## 2015-04-13 ENCOUNTER — Other Ambulatory Visit: Payer: Self-pay | Admitting: Family Medicine

## 2015-04-13 DIAGNOSIS — H543 Unqualified visual loss, both eyes: Secondary | ICD-10-CM

## 2015-04-13 DIAGNOSIS — J309 Allergic rhinitis, unspecified: Secondary | ICD-10-CM

## 2015-04-13 DIAGNOSIS — E1140 Type 2 diabetes mellitus with diabetic neuropathy, unspecified: Secondary | ICD-10-CM | POA: Diagnosis not present

## 2015-04-13 DIAGNOSIS — E1149 Type 2 diabetes mellitus with other diabetic neurological complication: Secondary | ICD-10-CM

## 2015-04-13 MED ORDER — GLUCOSE BLOOD VI STRP: ORAL_STRIP | Status: DC

## 2015-04-13 NOTE — Progress Notes (Signed)
At her request I sent new rx for Prodigy meter and strips for legally blind. I sent by Leticia Clas and I faxed to [place]

## 2015-04-14 ENCOUNTER — Ambulatory Visit: Payer: Commercial Managed Care - HMO | Admitting: *Deleted

## 2015-04-14 ENCOUNTER — Telehealth: Payer: Self-pay | Admitting: Family Medicine

## 2015-04-14 NOTE — Telephone Encounter (Signed)
Requesting results of xray

## 2015-04-17 ENCOUNTER — Encounter: Payer: Self-pay | Admitting: *Deleted

## 2015-04-17 ENCOUNTER — Other Ambulatory Visit: Payer: Self-pay | Admitting: *Deleted

## 2015-04-17 NOTE — Patient Outreach (Signed)
Received a call from pt apologizing for not being home for home visit, forgot.  Pt states she saw Dr. Nori Riis 8/10, MD increased her insulin dosage.  Pt reports she also saw [facility], got Wellbutrin- taking 100 mg daily.   Pt states her sugars are mostly in the 200's, some 100's, today 228.  Pt states she is drinking water. Pt states did not f/u with [facility] pharmacist, forgot and went on the wrong date, did not reschedule because she was to see MD.  Pt states A1C was checked, was up one point.      RN CM reviewed pt's recent A1C , informed pt result was 8.6 compared to last one of 8.5  RN CM reviewed pt's care plan, updated.  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    [facility]N Long Term Goal (31-90) days  Pt's blood sugars would not be in 300's in the next 31 days    [facility] Long Term Goal Start Date  04/14/15   [facility] CM Short Term Goal #1 (0-30 days)  Pt to have snack in between 2 meals in the next 30 days    [facility] CM Short Term Goal #1 Start Date  04/14/15   Interventions for Short Term Goal #2   Encouraged pt to have snack in between her 2 meals    [facility] CM Short Term Goal #2 (0-30 days)  Pt to exercise 3-4 times a week in the next 30 days    [facility] CM Short Term Goal #2 Start  Date  04/14/15   Interventions for Short Term Goal #2  Reviewed benefit of exercise- managing diabetes/sugas     [facility] CM Care Plan Problem Three        Patient Outreach Telephone from 04/17/2015 in [place] Problem Three  Elevated blood sugars    Care Plan for Problem Three  Active   [facility]N Long Term Goal (31-90) days  A1C would drop one point in the next 90 days    [facility]N Long Term Goal Start Date  02/16/15   [facility]N Long Term Goal Met Date  -- [not met- A1C checked in August, up from 8.5 to 8.6]   Interventions for Problem Three Long Term Goal  Discussed with pt effect high blood sugars have on vital organs, provided pt with information on hyperglycemia    [facility] CM Short Term Goal #3 (0-30 days)  pt to increase exercise to 3-4 days a week within the next 30 days    [facility]  CM Short Term Goal #3 Start  Date  03/22/15   [facility] CM Short Term Goal #3 Met Date  -- [not met.  ]   Interventions for Short Term Goal #3  Discussed with pt benefit of exercise with managing blood sugars       As discussed with pt, plan to do next home visit 9/8. RN CM to send Dr. Nori Riis update letter - ongoing [place] involvement   Zara Chess.   [place] Care Management  [phone]

## 2015-04-18 NOTE — Telephone Encounter (Signed)
Dear [name] Team Her x ray shows her cervical spine fusion is in good shape. Nothing broken or loose. Nothing that would be causing her pain so I think her pain is muscular as we have discussed. All in alll this is good news. THANKS! [name]

## 2015-04-19 ENCOUNTER — Other Ambulatory Visit: Payer: Self-pay | Admitting: Family Medicine

## 2015-04-19 ENCOUNTER — Encounter: Payer: Self-pay | Admitting: *Deleted

## 2015-04-19 ENCOUNTER — Other Ambulatory Visit: Payer: Self-pay | Admitting: *Deleted

## 2015-04-19 NOTE — Patient Outreach (Signed)
[place] [facility]) Care Management  [facility] Social Work  04/19/2015  TONA QUALLEY 1951-02-04 [id number]  Subjective:    Patient reported, "I need financial assistance to pay for medical supplies".  Objective:   CSW will assist patient with exploring options to pay for medical supplies (Large Pull-Up Depends, 3-in-1 Commode and Wrist Blood Pressure Monitor).  Current Medications:  Current Outpatient Prescriptions  Medication Sig Dispense Refill  . acetaminophen (TYLENOL) 325 MG tablet Take 650 mg by mouth every 6 (six) hours as needed (pain).     [name] acetaminophen-codeine (TYLENOL #3) 300-30 MG per tablet Take 1 tablet by mouth daily as needed (pain).     [name] acyclovir (ZOVIRAX) 200 MG capsule Take 200 mg by mouth 2 (two) times daily.    [name] albuterol (PROVENTIL HFA;VENTOLIN HFA) 108 (90 BASE) MCG/ACT inhaler Inhale 2 puffs into the lungs every 6 (six) hours as needed for wheezing or shortness of breath. 3 Inhaler 3  . albuterol (PROVENTIL) (2.5 MG/3ML) 0.083% nebulizer solution Take 2.5 mg by nebulization every 6 (six) hours as needed for wheezing or shortness of breath.    . allopurinol (ZYLOPRIM) 300 MG tablet TAKE 1 TABLET EVERY DAY (Patient taking differently: TAKE 1 TABLET BY MOUTH EVERY DAY) 90 tablet 3  . ammonium lactate (LAC-HYDRIN) 12 % lotion Apply 1 application topically 2 (two) times daily as needed for dry skin.    [name] aspirin EC 81 MG tablet Take 81 mg by mouth daily.    [name] atorvastatin (LIPITOR) 80 MG tablet Take 1 tablet (80 mg total) by mouth daily. 90 tablet 3  . azelastine (ASTELIN) 0.1 % nasal spray Place 2 sprays into both nostrils daily. Use in each nostril as directed    . beclomethasone (QVAR) 80 MCG/ACT inhaler Inhale 2 puffs into the lungs daily.    . Blood Glucose Monitoring Suppl (ACCU-CHEK AVIVA PLUS) W/DEVICE KIT Use to check blood sugar 3-4 times daily 1 kit 0  . brimonidine (ALPHAGAN P) 0.1 % SOLN Place 1 drop into the left eye 2 (two) times daily.      [name] buPROPion (WELLBUTRIN) 100 MG tablet Take 1 tablet (100 mg total) by mouth daily after breakfast. 30 tablet 2  . EPINEPHrine 0.3 mg/0.3 mL IJ SOAJ injection Inject 0.3 mLs (0.3 mg total) into the muscle once as needed (for allergic reaction). (Patient not taking: Reported on 03/22/2015) 1 Device 3  . esomeprazole (NEXIUM) 40 MG capsule Take 1 capsule (40 mg total) by mouth daily. 90 capsule 3  . ferrous sulfate 325 (65 FE) MG tablet Take 325 mg by mouth daily.    . furosemide (LASIX) 40 MG tablet Take 40 mg by mouth 2 (two) times daily.    [name] gabapentin (NEURONTIN) 300 MG capsule Take 300 mg by mouth daily as needed (pain).    [name] glucose blood test strip Test blood sugar 3-4 times daily 400 each 12  . Insulin Glargine (LANTUS SOLOSTAR) 100 UNIT/ML Solostar Pen INJECT  65 UNITS SUBCUTANEOUSLY EVERY DAY 65 mL 3  . Lancets MISC Use twice or three times daily to check blood sugar 200 each 12  . latanoprost (XALATAN) 0.005 % ophthalmic solution Place 1 drop into both eyes at bedtime.    [name] losartan (COZAAR) 100 MG tablet TAKE 1 TABLET EVERY DAY (Patient taking differently: TAKE 1 TABLET BY MOUTH  EVERY DAY) 90 tablet 3  . metoprolol succinate (TOPROL-XL) 50 MG 24 hr tablet Take 3 tablets (150 mg total) by mouth daily.  Take with or immediately following a meal. 270 tablet 3  . montelukast (SINGULAIR) 10 MG tablet TAKE 1 TABLET EVERY DAY (Patient taking differently: TAKE 1 TABLET BY MOUTH EVERY DAY) 90 tablet 3  . Multiple Vitamins-Minerals (MULTIVITAMIN PO) Take 1 tablet by mouth daily.    . nitroGLYCERIN (NITROSTAT) 0.4 MG SL tablet Place 0.4 mg under the tongue every 5 (five) minutes as needed for chest pain.    [name] NOVOLOG FLEXPEN 100 UNIT/ML FlexPen INJECT 30 UNITS SUBCUTANEOUSLY WITH THE EARLY MEAL AND 30 UNITS WITH THE LATE MEAL 60 mL 5  . salicylic acid 17 % gel Apply topically 2 (two) times daily. Apply small amount to callous area on bottom of foot BID 14 g 3  . sertraline (ZOLOFT) 100 MG tablet  TAKE 1 AND 1/2 TABLETS AT BEDTIME (Patient taking differently: TAKE 1 AND 1/2 TABLETS (150MG) BY MOUTH  AT BEDTIME) 135 tablet 3  . spironolactone (ALDACTONE) 25 MG tablet Take 1 tablet (25 mg total) by mouth daily before supper. 90 tablet 3  . TRULICITY 3.23 FT/7.3UK SOPN INJECT 0.75 MG INTO THE SKIN ONE TIME A WEEK. 4 pen 3  . Vitamin D, Ergocalciferol, (DRISDOL) 50000 UNITS CAPS capsule Take 1 capsule (50,000 Units total) by mouth every 30 (thirty) days. Take on 3rd of the month     No current facility-administered medications for this visit.    Functional Status:  In your present state of health, do you have any difficulty performing the following activities: 04/19/2015 03/29/2015  Hearing? N N  Vision? Y Y  Difficulty concentrating or making decisions? N N  Walking or climbing stairs? N N  Dressing or bathing? N N  Doing errands, shopping? N N  Preparing Food and eating ? N N  Using the Toilet? N N  In the past six months, have you accidently leaked urine? N N  Do you have problems with loss of bowel control? N N  Managing your Medications? - Y  Managing your Finances? N N  Housekeeping or managing your Housekeeping? [name]    Fall/Depression Screening:  PHQ 2/9 Scores 04/19/2015 04/12/2015 03/29/2015 03/17/2015 02/08/2015 01/26/2015 01/12/2015  PHQ - 2 Score 0 0 0 0 0 0 0    Assessment:   CSW was able to meet with patient today to perform a routine home visit.  Patient had a mess in her living room when CSW arrived, reporting that she has been looking for her and her son's birth certificates, unable to locate them anywhere in the house.  CSW agreed to assist patient with clean-up, as CSW was concerned that the clutter would cause a potential fall hazard.  Fortunately, CSW and patient were able to get her living room re-organized, prior to CSW's departure.   Patient reported that she has yet to receive a call from [facility] regarding the 3-in-1 commode that CSW ordered for patient  for home use.  CSW was able to make contact with a representative from [facility] who works in the DME [employer]) Department.  The representative reported that he did not have an order on file for the patient.  CSW received a fax confirmation reporting that the order had been received by [facility] on August 1st, after CSW obtained from patient's Primary Care Physician, Dr. [name].  CSW will continue to follow-up on the 3-in-1 commode until it is delivered to patient's home. Patient further reported that she needs financial assistance to help purchase  a wrist blood pressure monitor for when she travels outside the home, as well as large pull-up Depends.  CSW was able to assist patient with completion of an FAF (Financial Assessment Form) to determine whether or not patient is eligible to receive financial assistance to purchase the above items through [facility] [employer].  Patient is eligible, based on eligibility criteria, so CSW will complete an order form and submit to Bary Castilla, Surveyor, quantity with [facility] [employer], for possible approval.  If patient is approved for financial assistance, CSW will make arrangements to purchase the items and hand deliver to patient's home during the next scheduled home visit. Patient reported that she has an appointment scheduled for Thursday, August 18th at 10:00am to see her therapist at [facility].  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  Patient was pleased to announce that she will be receiving a new talking glucometer (Prodigy), free of charge through [facility], initiated by Dr. Nori Riis due to patient being diagnosed as legally blind. CSW and patient agreed to meet for the next routine home visit on  Wednesday, September 14th at 8:30am.  CSW is hopeful to have all equipment purchased by the next appointment to deliver directly to patient's home.  Patient is doing an excellent job of checking her blood sugars and weighing herself daily, keeping a log of both.  CSW is aware that patient has a [birth year] on [birth year], agreeing to purchase patient a large print calendar and a set of writing pens to make logging her information easier and more accessible.  Plan:   CSW will submit an FAF (Financial Assessment Form) to Bary Castilla, Surveyor, quantity with [employer], for review and approval to purchase a wrist blood pressure monitor and package of large pull-up Depends. CSW will meet with patient for the next routine home visit on Wednesday, September 14th at [place], [place], MSW, [facility] [place], [place], [place] [id number] [email or web address] [phone]

## 2015-04-19 NOTE — Telephone Encounter (Signed)
Spoke to pt. Gave her the information below. Ottis Stain, CMA

## 2015-04-19 NOTE — Telephone Encounter (Signed)
Refill completed.  Clarinda Obi L, RN  

## 2015-04-20 ENCOUNTER — Ambulatory Visit (INDEPENDENT_AMBULATORY_CARE_PROVIDER_SITE_OTHER): Payer: Commercial Managed Care - HMO

## 2015-04-20 ENCOUNTER — Ambulatory Visit (INDEPENDENT_AMBULATORY_CARE_PROVIDER_SITE_OTHER): Payer: Medicare HMO | Admitting: Psychiatry

## 2015-04-20 ENCOUNTER — Encounter (HOSPITAL_COMMUNITY): Payer: Self-pay | Admitting: Psychiatry

## 2015-04-20 VITALS — BP 108/64 | HR 75 | Ht 61.0 in | Wt 202.0 lb

## 2015-04-20 DIAGNOSIS — J309 Allergic rhinitis, unspecified: Secondary | ICD-10-CM | POA: Diagnosis not present

## 2015-04-20 DIAGNOSIS — F331 Major depressive disorder, recurrent, moderate: Secondary | ICD-10-CM

## 2015-04-20 DIAGNOSIS — F411 Generalized anxiety disorder: Secondary | ICD-10-CM

## 2015-04-20 MED ORDER — BUPROPION HCL 100 MG PO TABS: 100.0000 mg | ORAL_TABLET | Freq: Every day | ORAL | Status: DC

## 2015-04-20 MED ORDER — SERTRALINE HCL 100 MG PO TABS: ORAL_TABLET | ORAL | Status: DC

## 2015-04-20 NOTE — Progress Notes (Signed)
[facility] MD Progress Note  04/20/2015 10:31 AM Erin Sons  MRN:  [id number] Subjective:  Im feeling better   Principal Problem:Pt seen today for follow up states that her hemoglobin A1c is 8.6 and she is worried about that. she started the Wellbutrin and feels little bit better mood is better, continues to struggle with initial insomnia because of her CPAP machine appetite is good discussed healthy eating habits and incorporating fruits and vegetables into her diet she stated understanding. Denies feeling hopeless and helpless denies suicidal or homicidal ideation. No delusions or delusions overall his coping better.    Diagnosis:   Patient Active Problem List   Diagnosis Date Noted  . Generalized anxiety disorder [F41.1] 03/23/2015    Priority: High  . Major depression, recurrent [F33.9] 03/23/2015    Priority: Medium  . Vitamin D deficiency [E55.9] 06/30/2014  . Bee sting allergy [Z91.038] 06/30/2014  . Pain in joint, ankle and foot [M25.579] 02/09/2014  . Edema [R60.9] 02/09/2014  . Chest pain radiating to jaw [R07.9] 01/19/2014  . Chronic headaches [R51] 12/27/2013  . 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 Callous ulcer [L98.499] 01/15/2013  . Chronic flank pain [R10.10, G89.29] 10/07/2012  . Fusion of spine of cervical region [facility] 06/03/2012  . Acquired cyst of kidney [N28.1] 05/28/2012  . Incomplete bladder emptying [R33.9] 05/28/2012  . Abdominal pain, chronic, generalized [R10.84, G89.29] 05/07/2012  . Vision loss, bilateral [H54.3] 02/05/2012  . Nail dystrophy [L60.3] 11/15/2011  . Hip arthritis [M19.90] 05/29/2011  . 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 Allergic rhinitis due to pollen [J30.1] 01/24/2011  . GAIT IMBALANCE [R26.9] 05/30/2010  . CARPAL TUNNEL SYNDROME, BILATERAL [G56.00] 12/28/2009  . HYPERLIPIDEMIA-MIXED [E78.5] 05/01/2009  . HERPES SIMPLEX INFECTION, RECURRENT [B00.9] 09/14/2008  . Neuropathy due to medical condition  [G63] 06/08/2008  . NEURAL HEARING LOSS BILATERAL [H90.3] 03/02/2008  . Cataract extraction status [Z98.49] 09/10/2007  . 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 GERD [K21.9] 12/12/2006  . 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 Major depressive disorder, recurrent episode [F33.9] 10/30/2006  . Obstructive sleep apnea [G47.33] 10/30/2006  . Glaucoma [H40.9] 10/30/2006  . HYPERTENSION, BENIGN SYSTEMIC [I10] 10/30/2006                                                                                              from her initial intake      64 year old African-American single female mother of a 66 year old son was referred to me by her PCP Dr. Nori Riis. Patient reports that she has been depressed for many years and has been on Zoloft since 2001 but lately she feels that it's no longer working.  She also has multiple health problems, along with social problems especially with her son who has recently come out as being bisexual and patient is experiencing a very difficult time adjusting to that. Her son lives with her and has ADHD and autism.  Patient is on Social Security disability as she was born with congenital cataracts, reports that her sleep is disturbed and she uses a CPAP machine, appetite  tends to be excessive and she states that she has gained weight, mood is depressed but she talks about feeling anxious and ruminating and worrying about her son. She also states that she feels very tired and weak and has no motivation and no energy. Denies panic attacks, denies feeling hopeless and helpless. Denies suicidal or homicidal ideation and has no hallucinations or delusions.  Patient quit smoking cigarettes in 1980 does not use alcohol marijuana or other drugs.   Past Medical History:  Past Medical History  Diagnosis Date  . Chronic cough     Now followed by pulmonary  . Sleep apnea     on CPAP 12  . HTN (hypertension)   .  Dyslipidemia   . Gout   . History of colonoscopy   . Concussion 11/18/11    "fell at dr's office; hit head"  . Complication of anesthesia     "just wake up coughing; that's all"  . Heart murmur   . Asthma   . COPD (chronic obstructive pulmonary disease)   . Diabetes mellitus type II     "take insulin & pills"  . Blood transfusion   . GERD (gastroesophageal reflux disease)   . Arthritis   . Chronic lower back pain   . Vocal cord paralysis, unilateral partial     "right"  . Depression   . Peripheral neuropathy   . Angina   . Pneumonia     "i've had it once" (11/18/11)  . Exertional dyspnea   . Headache(784.0) 11/18/11    "I've had mild headaches the last couple days"  . Headache(784.0) 01/08/12    "pretty constant since 11/18/11's concussion"  . Syncope 06/16/2012  . Allergy   . Anxiety   . Blood transfusion without reported diagnosis   . Cataract   . Glaucoma   . Myocardial infarction   . Osteoporosis     Past Surgical History  Procedure Laterality Date  . Colonoscopy w/ biopsies  11/21/2010    adenoma polyp--no hi grade dysplasia Dr Collene Mares  . Treatment fistula anal    . Breast reduction surgery  04-21-2003  . Tubal ligation  1987  . Cardiac catheterization  03-02-2004  . Bronchoscopy  07-2007  . Knee arthroscopy  08/2000    right  . Shoulder arthroscopy  08/2000    right  . T-score  09/02/04    -0.54 (low risk currently)  . Total abdominal hysterectomy  10-07-2000  . Cesarean section  1987  . Shoulder arthroscopy w/ rotator cuff repair  10/18/2003    left  . Cataract extraction  1955; 1979    bilateral; right eye  . Eye surgery    . Breast biopsy      bilaterally  . Breast cyst excision      right twice; left once  . Carpal tunnel release      left  . Anterior cervical decomp/discectomy fusion  01/15/2012    Procedure: ANTERIOR CERVICAL DECOMPRESSION/DISCECTOMY FUSION 3 LEVELS;  Surgeon: Eustace Moore, MD;  Location: [facility];  Service: Neurosurgery;  Laterality:  N/A;  Anterior Cervical Decompression Discectomy Fusion Cerivcal three four, cervical four five, cervical five six, cervical six seven  . Left and right heart catheterization with coronary angiogram N/A 01/19/2014    Procedure: LEFT AND RIGHT HEART CATHETERIZATION WITH CORONARY ANGIOGRAM;  Surgeon: Sinclair Grooms, MD;  Location: [facility] CATH LAB;  Service: Cardiovascular;  Laterality: N/A;  . Spine surgery     Family History:  Family History  Problem Relation Age of Onset  . Diabetes Mother   . Colon cancer Neg Hx    Social History:  History  Alcohol Use No     History  Drug Use No    Social History   Social History  . Marital Status: Single    Spouse Name: N/A  . Number of Children: 1  . Years of Education: 12   Occupational History  . unemployed    Social History Main Topics  . Smoking status: Former Smoker -- 0.50 packs/day for .5 years    Types: Cigarettes    Quit date: 09/03/1975  . Smokeless tobacco: Never Used  . Alcohol Use: No  . Drug Use: No  . Sexual Activity: Not Currently   Other Topics Concern  . Not on file   Social History Narrative   Approved for section 8 housing (12/03).         Health Care POA:    Emergency Contact: sister, catherine Sequeira [phone]   End of Life Plan:    Who lives with you: Has a son who may be autistic and stays with her at times.   Any pets: none   Diet: Pt has a varied diet of protein, starch, and vegetables.   Exercise: Pt walks 1x a week with [place] [facility].   Seatbelts: Pt reports wearing seatbelt when in vehicles.    Hobbies: walking, tv, church, eating          Sleep: Fair  Appetite:  Good     Musculoskeletal: Strength & Muscle Tone: within normal limits Gait & Station: normal Patient leans: Walks with a cane   Psychiatric Specialty Exam: Physical Exam  Review of Systems  Constitutional: Positive for malaise/fatigue. Negative for fever, chills, weight loss and  diaphoresis.  HENT: Positive for hearing loss. Negative for congestion, ear discharge, ear pain, nosebleeds, sore throat and tinnitus.   Eyes: Negative for blurred vision and photophobia.       Patient has glaucoma  Respiratory: Negative for cough, hemoptysis, sputum production, shortness of breath, wheezing and stridor.   Cardiovascular: Positive for leg swelling. Negative for chest pain, palpitations and claudication.  Gastrointestinal: Negative for heartburn, nausea, vomiting, abdominal pain, diarrhea, constipation, blood in stool and melena.  Genitourinary: Negative for dysuria, urgency, frequency, hematuria and flank pain.  Musculoskeletal: Positive for myalgias, back pain, joint pain and neck pain. Negative for falls.  Skin: Negative for itching and rash.  Neurological: Positive for focal weakness and weakness. Negative for dizziness, tingling, sensory change, speech change, seizures and headaches.  Endo/Heme/Allergies: Positive for environmental allergies. Negative for polydipsia. Does not bruise/bleed easily.  Psychiatric/Behavioral: Positive for depression. The patient is nervous/anxious.     Blood pressure 108/64, pulse 75, height 5' 1"  (1.549 m), weight 202 lb (91.627 kg).Body mass index is 38.19 kg/(m^2).  General Appearance: Casual  Eye Contact::  Fair  Speech:  Clear and Coherent and Normal Rate  Volume:  Normal  Mood:  Anxious  Affect:  Appropriate  Thought Process:  Goal Directed, Linear and Logical  Orientation:  Full (Time, Place, and Person)  Thought Content:  Rumination  Suicidal Thoughts:  No  Homicidal Thoughts:  No  Memory:  Immediate;   Fair Recent;   Fair Remote;   Fair  Judgement:  Good  Insight:  Good  Psychomotor Activity:  Decreased  Concentration:  Fair  Recall:  Good  Fund of Knowledge:Good  Language: Good  Akathisia:  No  Handed:  Right  AIMS (if indicated):     Assets:  Communication Skills Desire for Improvement Resilience Transportation   ADL's:  Intact  Cognition: WNL  Sleep:        Current Medications: Current Outpatient Prescriptions  Medication Sig Dispense Refill  . acetaminophen (TYLENOL) 325 MG tablet Take 650 mg by mouth every 6 (six) hours as needed (pain).     [name] acetaminophen-codeine (TYLENOL #3) 300-30 MG per tablet Take 1 tablet by mouth daily as needed (pain).     [name] acyclovir (ZOVIRAX) 200 MG capsule Take 200 mg by mouth 2 (two) times daily.    [name] albuterol (PROVENTIL HFA;VENTOLIN HFA) 108 (90 BASE) MCG/ACT inhaler Inhale 2 puffs into the lungs every 6 (six) hours as needed for wheezing or shortness of breath. 3 Inhaler 3  . albuterol (PROVENTIL) (2.5 MG/3ML) 0.083% nebulizer solution Take 2.5 mg by nebulization every 6 (six) hours as needed for wheezing or shortness of breath.    . allopurinol (ZYLOPRIM) 300 MG tablet TAKE 1 TABLET EVERY DAY (Patient taking differently: TAKE 1 TABLET BY MOUTH EVERY DAY) 90 tablet 3  . ammonium lactate (LAC-HYDRIN) 12 % lotion Apply 1 application topically 2 (two) times daily as needed for dry skin.    [name] aspirin EC 81 MG tablet Take 81 mg by mouth daily.    [name] atorvastatin (LIPITOR) 80 MG tablet Take 1 tablet (80 mg total) by mouth daily. 90 tablet 3  . azelastine (ASTELIN) 0.1 % nasal spray Place 2 sprays into both nostrils daily. Use in each nostril as directed    . beclomethasone (QVAR) 80 MCG/ACT inhaler Inhale 2 puffs into the lungs daily.    . Blood Glucose Monitoring Suppl (ACCU-CHEK AVIVA PLUS) W/DEVICE KIT Use to check blood sugar 3-4 times daily 1 kit 0  . brimonidine (ALPHAGAN P) 0.1 % SOLN Place 1 drop into the left eye 2 (two) times daily.     [name] buPROPion (WELLBUTRIN) 100 MG tablet Take 1 tablet (100 mg total) by mouth daily after breakfast. 30 tablet 2  . EPINEPHrine 0.3 mg/0.3 mL IJ SOAJ injection Inject 0.3 mLs (0.3 mg total) into the muscle once as needed (for allergic reaction). (Patient not taking: Reported on 03/22/2015) 1 Device 3  . esomeprazole (NEXIUM) 40 MG  capsule Take 1 capsule (40 mg total) by mouth daily. 90 capsule 3  . ferrous sulfate 325 (65 FE) MG tablet Take 325 mg by mouth daily.    . furosemide (LASIX) 40 MG tablet Take 40 mg by mouth 2 (two) times daily.    [name] gabapentin (NEURONTIN) 300 MG capsule Take 300 mg by mouth daily as needed (pain).    [name] glucose blood test strip Test blood sugar 3-4 times daily 400 each 12  . Insulin Glargine (LANTUS SOLOSTAR) 100 UNIT/ML Solostar Pen INJECT  65 UNITS SUBCUTANEOUSLY EVERY DAY 65 mL 3  . Lancets MISC Use twice or three times daily to check blood sugar 200 each 12  . latanoprost (XALATAN) 0.005 % ophthalmic solution Place 1 drop into both eyes at bedtime.    [name] losartan (COZAAR) 100 MG tablet TAKE 1 TABLET EVERY DAY (Patient taking differently: TAKE 1 TABLET BY MOUTH  EVERY DAY) 90 tablet 3  . metoprolol succinate (TOPROL-XL) 50 MG 24 hr tablet Take 3 tablets (150 mg total) by mouth daily. Take with or immediately following a meal. 270 tablet 3  . montelukast (SINGULAIR) 10 MG tablet TAKE 1 TABLET EVERY DAY (Patient taking differently:  TAKE 1 TABLET BY MOUTH EVERY DAY) 90 tablet 3  . Multiple Vitamins-Minerals (MULTIVITAMIN PO) Take 1 tablet by mouth daily.    . nitroGLYCERIN (NITROSTAT) 0.4 MG SL tablet Place 0.4 mg under the tongue every 5 (five) minutes as needed for chest pain.    [name] NOVOLOG FLEXPEN 100 UNIT/ML FlexPen INJECT 30 UNITS SUBCUTANEOUSLY WITH THE EARLY MEAL AND 30 UNITS WITH THE LATE MEAL 60 mL 5  . salicylic acid 17 % gel Apply topically 2 (two) times daily. Apply small amount to callous area on bottom of foot BID 14 g 3  . sertraline (ZOLOFT) 100 MG tablet TAKE 1 AND 1/2 TABLETS AT BEDTIME (Patient taking differently: TAKE 1 AND 1/2 TABLETS (150MG) BY MOUTH  AT BEDTIME) 135 tablet 3  . spironolactone (ALDACTONE) 25 MG tablet Take 1 tablet (25 mg total) by mouth daily before supper. 90 tablet 3  . TRULICITY 2.59 DG/3.8VF SOPN INJECT 0.75 MG INTO THE SKIN ONE TIME A WEEK. 4 pen 3  .  Vitamin D, Ergocalciferol, (DRISDOL) 50000 UNITS CAPS capsule Take 1 capsule (50,000 Units total) by mouth every 30 (thirty) days. Take on 3rd of the month     No current facility-administered medications for this visit.    Lab Results: No results found. However, due to the size of the patient record, not all encounters were searched. Please check Results Review for a complete set of results.  Physical Findings: AIMS:  , ,  ,  ,    CIWA:    COWS:     Treatment Plan Summary: Medication management  #1 Maj. depression recurrent chronic moderate. Continue Wellbutrin 100 mg by mouth every morning. I She'll be continued on Zoloft 150 mg by mouth every a.m. #2 generalized anxiety disorder. Will be treated with about her medications. Also discussed relaxations and deep breathing techniques with her and patient has agreed to practice them at home. #3 Multiple medical problems Will be treated by her PCP Dr. Mallie Mussel. #4 nutrition Discussed the importance of nutrition and psychoeducation regarding nutrition given her multiple health problems was discussed with her. Patient stated understanding and also encouraged her to walk a little more so that she moves. #5 patient will return to see me in the clinic in 4 weeks for follow-up or earlier if necessary. At this time she is refusing a therapist due to transportation issues.  She'll return to see me in the clinic in 2 months. Call sooner if necessary.  This visit was of high intensity and was of 30 minutes. 50% of the time was spent in discussing diet and nutrition and the importance of that especially since her hemoglobin A1c is very high. Discussed eating fresh fruits and vegetables and problem solved with patient's transportation issues and that she go shopping once a month encouraged her to cut the vegetables and put them in the freezer so she can have vegetables 3 times a day. Patient stated understanding also encouraged her to continue the  medications and med compliance. Medical Decision Making:  Established Problem, Stable/Improving (1), Review of Psycho-Social Stressors (1), Review and summation of old records (2) and Review of Medication Regimen & Side Effects (2)     Afrah Burlison 04/20/2015, 10:31 AM

## 2015-04-24 ENCOUNTER — Encounter: Payer: Self-pay | Admitting: Family Medicine

## 2015-04-24 ENCOUNTER — Ambulatory Visit (INDEPENDENT_AMBULATORY_CARE_PROVIDER_SITE_OTHER): Payer: Commercial Managed Care - HMO | Admitting: Family Medicine

## 2015-04-24 VITALS — BP 130/53 | HR 82 | Ht 61.0 in | Wt 201.0 lb

## 2015-04-24 DIAGNOSIS — R1032 Left lower quadrant pain: Secondary | ICD-10-CM

## 2015-04-24 DIAGNOSIS — M1610 Unilateral primary osteoarthritis, unspecified hip: Secondary | ICD-10-CM

## 2015-04-24 DIAGNOSIS — M1990 Unspecified osteoarthritis, unspecified site: Secondary | ICD-10-CM | POA: Diagnosis not present

## 2015-04-24 DIAGNOSIS — M25552 Pain in left hip: Secondary | ICD-10-CM | POA: Diagnosis not present

## 2015-04-24 MED ORDER — METHYLPREDNISOLONE ACETATE 40 MG/ML IJ SUSP
40.0000 mg | Freq: Once | INTRAMUSCULAR | Status: AC
  Administered 2015-04-24: 40 mg via INTRA_ARTICULAR

## 2015-04-24 NOTE — Progress Notes (Signed)
   Subjective:    Patient ID: [name] [name], female    DOB: 1951-04-08, 64 y.o.   MRN: [id number]  HPI Same left flank/hip/lower quadrant pain over the last week. It is now 8 or 9 out of 10 for at least part of each day and really does not get much better than 4 out of 10. Aching in nature. Hurts worse if she sits for long period of time or if she stands for long period of time. Walking does not seem to make it worse.   Review of Systems No change in bowel or bladder habits, no constipation, no diarrhea. No other abdominal pains or cramping, no nausea, no fever.    Objective:   Physical Exam Vital signs are reviewed GEN.: Well-developed overweight female no acute distress BLIND walking with a cane ABDOMEN: Soft, positive bowel sounds, nontender nondistended no rebound no guarding. Left flank area is nontender to palpation or percussion. She has mild tenderness to palpation on the left superior iliac crests and she has tennis to palpation directly posterior to the left greater trochanteric bursa area.  On ultrasound I was unable to visualize the area posterior to the greater trochanter due to fat deposition.  INJECTION: Patient was given informed consent, signed copy in the chart. Appropriate time out was taken. Area prepped and draped in usual sterile fashion. 1 cc of methylprednisolone 40 mg/ml plus  3 cc of 1% lidocaine without epinephrine was injected into the left greater trochanteric bursa using a(n) perpendicular approach. The patient tolerated the procedure well. There were no complications. Post procedure instructions were given.        Assessment & Plan:  Chronic left flank/hip/lower quadrant pain that seems to have worsened in the last week. She has had this for quite a while and we have never been able to figure it out. I think there is some component of hip arthritis. Today I injected her left hip greater trochanteric bursa area. After the administration of the shot, I  asked her she had some improvement in the pain from the numbing medicine she said no. I'll set her up for abdomen and pelvis with contrast to see if there is anything intra-abdominal that we've missed. Her last CT abdomen pelvis was done 2013. I'll talk with her after the CT scan.

## 2015-04-25 ENCOUNTER — Other Ambulatory Visit: Payer: Self-pay | Admitting: *Deleted

## 2015-04-25 NOTE — Patient Outreach (Signed)
[facility]) Care Management  04/25/2015  [name] [name] May 25, 1951 [id number]   CSW was able to make contact with patient today to follow-up regarding recent order submitted to [place] [facility] for patient to receive a 3-in-1 commode for home use.  Patient admits that 3-in-1 commode has not been obtained, nor has patient received a call from a representative with [facility] to discuss delivery of the durable medical equipment.  CSW was able to make contact with a representative from [facility], who reports needing a letter of medical necessity, in addition to the order for the 3-in-1 commode.  CSW, nor patient's Primary Care Physician, Dr. [name], were aware of this request.  CSW agreed to submit an email message to Dr. Nori Riis, requesting a letter of medical necessity, along with the order, for patient to receive a 3-in-1 commode through [facility] for home use.  No additional social work needs identified at present.  CSW will continue to follow patient to try and obtain a large pack of pull up depends, as well as a wrist blood pressure monitor, per patient's request.  Nat Christen, BSW, MSW, [facility] [place], [place], [place] [address] [email or web address] [phone]

## 2015-04-26 ENCOUNTER — Ambulatory Visit
Admission: RE | Admit: 2015-04-26 | Discharge: 2015-04-26 | Disposition: A | Discharge status: Home / Self Care | Payer: Commercial Managed Care - HMO | Source: Ambulatory Visit | Attending: Family Medicine | Admitting: Family Medicine

## 2015-04-26 DIAGNOSIS — K5730 Diverticulosis of large intestine without perforation or abscess without bleeding: Secondary | ICD-10-CM | POA: Diagnosis not present

## 2015-04-26 DIAGNOSIS — R1032 Left lower quadrant pain: Secondary | ICD-10-CM | POA: Diagnosis not present

## 2015-04-26 MED ORDER — IOPAMIDOL (ISOVUE-300) INJECTION 61%
125.0000 mL | Freq: Once | INTRAVENOUS | Status: AC | PRN
  Administered 2015-04-26: 125 mL via INTRAVENOUS

## 2015-04-27 ENCOUNTER — Other Ambulatory Visit: Payer: Self-pay | Admitting: *Deleted

## 2015-04-27 ENCOUNTER — Ambulatory Visit (INDEPENDENT_AMBULATORY_CARE_PROVIDER_SITE_OTHER): Payer: Commercial Managed Care - HMO

## 2015-04-27 DIAGNOSIS — J309 Allergic rhinitis, unspecified: Secondary | ICD-10-CM | POA: Diagnosis not present

## 2015-04-27 NOTE — Patient Outreach (Signed)
[facility]) Care Management  04/27/2015  Shavanna Furnari March 28, 1951 [id number]   CSW was able to make contact with patient today to inform her that she has been approved to receive a $50.00 gift card, through [place] [employer] with [facility] [place] [employer], to purchase a wrist blood pressure monitor for when she travels outside the home.  In addition, CSW was able to purchase patient a box of 80 Depends, size large, pull-ups.  Patient is still waiting to receive her Prodigy - Talking Glucometer and 3-in-1 Commode, through [place], approved by patient's insurance coverage with [facility].  The Prodigy will be mailed directly to patient's home and the 3-in-1 Commode will be delivered by a representative with [facility], as soon as Letter of Medical Necessity is received by patient's Primary Care Physician, Dr. [name].  Dr. Nori Riis is aware that [facility] needs a letter of Medical Necessity, along with the order, already submitted, to complete the process.  CSW will make arrangements to have patient's Depends and Wrist Blood Pressure Monitor delivered to patient's home.  Nat Christen, BSW, MSW, [facility] [place], [place], [place] [address] [email or web address] [phone]

## 2015-04-28 ENCOUNTER — Telehealth: Payer: Self-pay | Admitting: Family Medicine

## 2015-04-28 ENCOUNTER — Other Ambulatory Visit: Payer: Self-pay

## 2015-04-28 NOTE — Telephone Encounter (Signed)
Pt called and would like to know if the doctor had a chance to look at her test results. jw

## 2015-04-28 NOTE — Telephone Encounter (Signed)
CT scan of abdomen shows nothing there that would cause her pain other than some constipation. [name] [name]

## 2015-04-28 NOTE — Telephone Encounter (Signed)
[name] [name] called to ask that the email sent regarding this order be sent back.  She need to have a new one.  Please contact [facility] to give them the necessary information.  And also call her with results of CT scan.

## 2015-04-28 NOTE — Telephone Encounter (Signed)
Will forward to MD to check on test results.  Jazmin Hartsell,CMA

## 2015-05-01 ENCOUNTER — Encounter: Payer: Self-pay | Admitting: Family Medicine

## 2015-05-01 NOTE — Telephone Encounter (Signed)
LM for pt to call the office. Please inform her of the below message. Ottis Stain, CMA

## 2015-05-03 NOTE — Telephone Encounter (Signed)
Pt is returning Sharon's call. I read her the results but she still has some questions for Ivin Booty and would like her to call. jw

## 2015-05-04 ENCOUNTER — Ambulatory Visit (INDEPENDENT_AMBULATORY_CARE_PROVIDER_SITE_OTHER): Payer: Commercial Managed Care - HMO

## 2015-05-04 ENCOUNTER — Telehealth: Payer: Self-pay | Admitting: Family Medicine

## 2015-05-04 DIAGNOSIS — J309 Allergic rhinitis, unspecified: Secondary | ICD-10-CM

## 2015-05-04 NOTE — Telephone Encounter (Signed)
Lenor Coffin is calling on behalf of the patient and states that the order for a 3-in-1 commode for the patient went through just fine, the only thing is [facility] also needs a letter of medical necessity to go with this order. This letter may be faxed directly to [facility] at fax: [phone]. Thank you, Fonda Kinder, ASA

## 2015-05-05 ENCOUNTER — Encounter: Payer: Self-pay | Admitting: *Deleted

## 2015-05-11 ENCOUNTER — Other Ambulatory Visit: Payer: Self-pay | Admitting: *Deleted

## 2015-05-12 NOTE — Patient Outreach (Signed)
[facility]) Care Management   05/12/2015- for visit done 05/11/15  [name] [name] [birth year] [id number]  [name] [name] is an 64 y.o. female  Subjective: Pt reports had CT Scan done because of c/o pain to left lower quadrant/left hip- informed did not  See anything.  Pt states she has pain always, pain medication helps some.  Pt states MD prescribed her in the  Past to take Bisacodyl 65m  Daily  but she can only it once a week.   [facility]  Pt states she is waiting on her new Prodig[employer] meter and 3 in 1 commode.    Objective:   Filed Vitals:   05/11/15 1300  BP: 128/70  Pulse: 78  Resp: 24    ROS  Physical Exam  Constitutional: She is oriented to person, place, and time. She appears well-developed and well-nourished.  Cardiovascular: Normal rate.   Respiratory: Breath sounds normal.  GI: Soft. Bowel sounds are normal.  Musculoskeletal: Normal range of motion.  Neurological: She is alert and oriented to person, place, and time.  Psychiatric: She has a normal mood and affect. Her behavior is normal. Judgment and thought content normal.    Current Medications:  Medications reviewed with pt  Current Outpatient Prescriptions  Medication Sig Dispense Refill  . acetaminophen (TYLENOL) 325 MG tablet Take 650 mg by mouth every 6 (six) hours as needed (pain).     .Marland Kitchenacetaminophen-codeine (TYLENOL #3) 300-30 MG per tablet Take 1 tablet by mouth daily as needed (pain).     .Marland Kitchenacyclovir (ZOVIRAX) 200 MG capsule Take 200 mg by mouth 2 (two) times daily.    .Marland Kitchenalbuterol (PROVENTIL HFA;VENTOLIN HFA) 108 (90 BASE) MCG/ACT inhaler Inhale 2 puffs into the lungs every 6 (six) hours as needed for wheezing or shortness of breath. 3 Inhaler 3  . albuterol (PROVENTIL) (2.5 MG/3ML) 0.083% nebulizer solution Take 2.5 mg by nebulization every 6 (six) hours as needed for wheezing or shortness of breath.    . allopurinol  (ZYLOPRIM) 300 MG tablet TAKE 1 TABLET EVERY DAY (Patient taking differently: TAKE 1 TABLET BY MOUTH EVERY DAY) 90 tablet 3  . ammonium lactate (LAC-HYDRIN) 12 % lotion Apply 1 application topically 2 (two) times daily as needed for dry skin.    .Marland Kitchenaspirin EC 81 MG tablet Take 81 mg by mouth daily.    .Marland Kitchenatorvastatin (LIPITOR) 80 MG tablet Take 1 tablet (80 mg total) by mouth daily. 90 tablet 3  . azelastine (ASTELIN) 0.1 % nasal spray Place 2 sprays into both nostrils daily. Use in each nostril as directed    . beclomethasone (QVAR) 80 MCG/ACT inhaler Inhale 2 puffs into the lungs daily.    . bisacodyl (DULCOLAX) 5 MG EC tablet Take 5 mg by mouth daily as needed. Pt takes 1-2 tablets once a week    . Blood Glucose Monitoring Suppl (ACCU-CHEK AVIVA PLUS) W/DEVICE KIT Use to check blood sugar 3-4 times daily 1 kit 0  . brimonidine (ALPHAGAN P) 0.1 % SOLN Place 1 drop into the left eye 2 (two) times daily.     .Marland KitchenbuPROPion (WELLBUTRIN) 100 MG tablet Take 1 tablet (100 mg total) by mouth daily after breakfast. 90 tablet 0  . EPINEPHrine 0.3 mg/0.3 mL IJ SOAJ injection Inject 0.3 mLs (0.3 mg total) into the muscle once as needed (for allergic reaction). 1 Device 3  . esomeprazole (NEXIUM) 40 MG capsule Take  1 capsule (40 mg total) by mouth daily. 90 capsule 3  . ferrous sulfate 325 (65 FE) MG tablet Take 325 mg by mouth daily.    . furosemide (LASIX) 40 MG tablet Take 40 mg by mouth 2 (two) times daily.    [name] glucose blood test strip Test blood sugar 3-4 times daily 400 each 12  . Insulin Glargine (LANTUS SOLOSTAR) 100 UNIT/ML Solostar Pen INJECT  65 UNITS SUBCUTANEOUSLY EVERY DAY 65 mL 3  . Lancets MISC Use twice or three times daily to check blood sugar 200 each 12  . latanoprost (XALATAN) 0.005 % ophthalmic solution Place 1 drop into both eyes at bedtime.    [name] losartan (COZAAR) 100 MG tablet TAKE 1 TABLET EVERY DAY (Patient taking differently: TAKE 1 TABLET BY MOUTH  EVERY DAY) 90 tablet 3  .  metoprolol succinate (TOPROL-XL) 50 MG 24 hr tablet Take 3 tablets (150 mg total) by mouth daily. Take with or immediately following a meal. 270 tablet 3  . montelukast (SINGULAIR) 10 MG tablet TAKE 1 TABLET EVERY DAY (Patient taking differently: TAKE 1 TABLET BY MOUTH EVERY DAY) 90 tablet 3  . Multiple Vitamins-Minerals (MULTIVITAMIN PO) Take 1 tablet by mouth daily.    [name] NOVOLOG FLEXPEN 100 UNIT/ML FlexPen INJECT 30 UNITS SUBCUTANEOUSLY WITH THE EARLY MEAL AND 30 UNITS WITH THE LATE MEAL 60 mL 5  . sertraline (ZOLOFT) 100 MG tablet TAKE 1 AND 1/2 TABLETS AT BEDTIME 135 tablet 3  . spironolactone (ALDACTONE) 25 MG tablet Take 1 tablet (25 mg total) by mouth daily before supper. 90 tablet 3  . TRULICITY 9.35 TS/1.7BL SOPN INJECT 0.75 MG INTO THE SKIN ONE TIME A WEEK. 4 pen 3  . Vitamin D, Ergocalciferol, (DRISDOL) 50000 UNITS CAPS capsule Take 1 capsule (50,000 Units total) by mouth every 30 (thirty) days. Take on 3rd of the month    . gabapentin (NEURONTIN) 300 MG capsule Take 300 mg by mouth daily as needed (pain).    . nitroGLYCERIN (NITROSTAT) 0.4 MG SL tablet Place 0.4 mg under the tongue every 5 (five) minutes as needed for chest pain.    . salicylic acid 17 % gel Apply topically 2 (two) times daily. Apply small amount to callous area on bottom of foot BID (Patient not taking: Reported on 05/11/2015) 14 g 3   No current facility-administered medications for this visit.    Functional Status:   In your present state of health, do you have any difficulty performing the following activities: 04/19/2015 03/29/2015  Hearing? N N  Vision? Y Y  Difficulty concentrating or making decisions? N N  Walking or climbing stairs? N N  Dressing or bathing? N N  Doing errands, shopping? N N  Preparing Food and eating ? N N  Using the Toilet? N N  In the past six months, have you accidently leaked urine? N N  Do you have problems with loss of bowel control? N N  Managing your Medications? - Y  Managing  your Finances? N N  Housekeeping or managing your Housekeeping? [name]    Fall/Depression Screening:    PHQ 2/9 Scores 04/19/2015 04/12/2015 03/29/2015 03/17/2015 02/08/2015 01/26/2015 01/12/2015  PHQ - 2 Score 0 0 0 0 0 0 0    Assessment:  DM- view of pt's glucometer, blood sugar today 133, ranges in am 111-230, pm 128-324.  7,14,30 day averages under 200.    Not taking walks due to hot weather.  Bowel management: pt reported Bisacodyl not working as well.                              Plan:   Pt to continue to check sugars/record.             Pt's goal to have a snack with protein in between her 2 meals.              Pt to ask MD at next office visit something different to manage bowels.              Plan to see pt again 10/10, discharge if remain stable.    THN CM Care Plan Problem Two        Most Recent Value   Care Plan for Problem Two  Active   Interventions for Problem Two Long Term Goal   Discussed with pt continue to drink water.    THN Long Term Goal (31-90) days  Pt's blood sugars would not be in 300's in the next 31 days    THN Long Term Goal Start Date  04/14/15   [facility] Long Term Goal Met Date  -- [goal not met- has a blood sugar of 324 in pm ]   THN CM Short Term Goal #2 (0-30 days)  Pt to exercise 3-4 times a week in the next 30 days    THN CM Short Term Goal #2 Start Date  04/14/15   [facility] CM Short Term Goal #2 Met Date  -- [goal not met- pt not been exercising, too hot to go walking ]   Interventions for Short Term Goal #2  Reviewed benefit of exercise- managing diabetes/sugas     [facility] CM Care Plan Problem Three        Most Recent Value   Care Plan Problem Three  Elevated blood sugars    Role Documenting the Problem Three  Care Management Coordinator   Care Plan for Problem Three  Active   THN CM Short Term Goal #4 (0-30 days)  pt's blood sugar would  be under 300 in the next 30 days    THN CM Short Term Goal #4 Start Date  05/11/15   Interventions for  Short Term Goal #4  Discussed with pt having snack in between hr 2 meals, better management of diabetes.       Zara Chess.   [facility]  [phone]

## 2015-05-17 ENCOUNTER — Encounter: Payer: Self-pay | Admitting: *Deleted

## 2015-05-17 ENCOUNTER — Other Ambulatory Visit: Payer: Self-pay | Admitting: *Deleted

## 2015-05-17 NOTE — Patient Outreach (Signed)
[facility]) Care Management  [facility] Social Work  05/17/2015  [name] [name] 12/10/1950 [id number]  Subjective:    "I really need to start exercising, now that it is getting cooler outside".  Objective:   CSW agreed to provide patient with an exercise program that is low-impact and achievable for her current medical conditions.  Current Medications:  Current Outpatient Prescriptions  Medication Sig Dispense Refill  . acetaminophen (TYLENOL) 325 MG tablet Take 650 mg by mouth every 6 (six) hours as needed (pain).     [name] acetaminophen-codeine (TYLENOL #3) 300-30 MG per tablet Take 1 tablet by mouth daily as needed (pain).     [name] acyclovir (ZOVIRAX) 200 MG capsule Take 200 mg by mouth 2 (two) times daily.    [name] albuterol (PROVENTIL HFA;VENTOLIN HFA) 108 (90 BASE) MCG/ACT inhaler Inhale 2 puffs into the lungs every 6 (six) hours as needed for wheezing or shortness of breath. 3 Inhaler 3  . albuterol (PROVENTIL) (2.5 MG/3ML) 0.083% nebulizer solution Take 2.5 mg by nebulization every 6 (six) hours as needed for wheezing or shortness of breath.    . allopurinol (ZYLOPRIM) 300 MG tablet TAKE 1 TABLET EVERY DAY (Patient taking differently: TAKE 1 TABLET BY MOUTH EVERY DAY) 90 tablet 3  . ammonium lactate (LAC-HYDRIN) 12 % lotion Apply 1 application topically 2 (two) times daily as needed for dry skin.    [name] aspirin EC 81 MG tablet Take 81 mg by mouth daily.    [name] atorvastatin (LIPITOR) 80 MG tablet Take 1 tablet (80 mg total) by mouth daily. 90 tablet 3  . azelastine (ASTELIN) 0.1 % nasal spray Place 2 sprays into both nostrils daily. Use in each nostril as directed    . beclomethasone (QVAR) 80 MCG/ACT inhaler Inhale 2 puffs into the lungs daily.    . bisacodyl (DULCOLAX) 5 MG EC tablet Take 5 mg by mouth daily as needed. Pt takes 1-2 tablets once a week    . Blood Glucose Monitoring Suppl (ACCU-CHEK AVIVA PLUS) W/DEVICE KIT Use to check blood sugar 3-4 times daily 1 kit 0   . brimonidine (ALPHAGAN P) 0.1 % SOLN Place 1 drop into the left eye 2 (two) times daily.     [name] buPROPion (WELLBUTRIN) 100 MG tablet Take 1 tablet (100 mg total) by mouth daily after breakfast. 90 tablet 0  . EPINEPHrine 0.3 mg/0.3 mL IJ SOAJ injection Inject 0.3 mLs (0.3 mg total) into the muscle once as needed (for allergic reaction). 1 Device 3  . esomeprazole (NEXIUM) 40 MG capsule Take 1 capsule (40 mg total) by mouth daily. 90 capsule 3  . ferrous sulfate 325 (65 FE) MG tablet Take 325 mg by mouth daily.    . furosemide (LASIX) 40 MG tablet Take 40 mg by mouth 2 (two) times daily.    [name] gabapentin (NEURONTIN) 300 MG capsule Take 300 mg by mouth daily as needed (pain).    [name] glucose blood test strip Test blood sugar 3-4 times daily 400 each 12  . Insulin Glargine (LANTUS SOLOSTAR) 100 UNIT/ML Solostar Pen INJECT  65 UNITS SUBCUTANEOUSLY EVERY DAY 65 mL 3  . Lancets MISC Use twice or three times daily to check blood sugar 200 each 12  . latanoprost (XALATAN) 0.005 % ophthalmic solution Place 1 drop into both eyes at bedtime.    [name] losartan (COZAAR) 100 MG tablet TAKE 1 TABLET EVERY DAY (Patient taking differently: TAKE 1 TABLET BY MOUTH  EVERY DAY) 90 tablet 3  .  metoprolol succinate (TOPROL-XL) 50 MG 24 hr tablet Take 3 tablets (150 mg total) by mouth daily. Take with or immediately following a meal. 270 tablet 3  . montelukast (SINGULAIR) 10 MG tablet TAKE 1 TABLET EVERY DAY (Patient taking differently: TAKE 1 TABLET BY MOUTH EVERY DAY) 90 tablet 3  . Multiple Vitamins-Minerals (MULTIVITAMIN PO) Take 1 tablet by mouth daily.    . nitroGLYCERIN (NITROSTAT) 0.4 MG SL tablet Place 0.4 mg under the tongue every 5 (five) minutes as needed for chest pain.    [name] NOVOLOG FLEXPEN 100 UNIT/ML FlexPen INJECT 30 UNITS SUBCUTANEOUSLY WITH THE EARLY MEAL AND 30 UNITS WITH THE LATE MEAL 60 mL 5  . salicylic acid 17 % gel Apply topically 2 (two) times daily. Apply small amount to callous area on bottom of foot  BID (Patient not taking: Reported on 05/11/2015) 14 g 3  . sertraline (ZOLOFT) 100 MG tablet TAKE 1 AND 1/2 TABLETS AT BEDTIME 135 tablet 3  . spironolactone (ALDACTONE) 25 MG tablet Take 1 tablet (25 mg total) by mouth daily before supper. 90 tablet 3  . TRULICITY 0.93 AT/5.5DD SOPN INJECT 0.75 MG INTO THE SKIN ONE TIME A WEEK. 4 pen 3  . Vitamin D, Ergocalciferol, (DRISDOL) 50000 UNITS CAPS capsule Take 1 capsule (50,000 Units total) by mouth every 30 (thirty) days. Take on 3rd of the month     No current facility-administered medications for this visit.    Functional Status:  In your present state of health, do you have any difficulty performing the following activities: 05/17/2015 04/19/2015  Hearing? N N  Vision? Y Y  Difficulty concentrating or making decisions? N N  Walking or climbing stairs? N N  Dressing or bathing? N N  Doing errands, shopping? N N  Preparing Food and eating ? N N  Using the Toilet? N N  In the past six months, have you accidently leaked urine? N N  Do you have problems with loss of bowel control? N N  Managing your Medications? Y -  Managing your Finances? N N  Housekeeping or managing your Housekeeping? [name]    Fall/Depression Screening:  PHQ 2/9 Scores 05/17/2015 04/19/2015 04/12/2015 03/29/2015 03/17/2015 02/08/2015 01/26/2015  PHQ - 2 Score 0 0 0 0 0 0 0    Assessment:   CSW was able to meet with patient today to perform a routine home visit.  Patient admitted to [place] that she is ready to get involved in an exercise routine, "now that it is getting cooler outside".  Patient went on to say that she would like to start walking in her neighborhood, in the evenings when her son, Claudine Mouton gets home from work.  Patient reported, "Lord knows, we could both use the exercise".  CSW went on-line and looked up various exercise programs for patient.  Patient was encouraged to take her cane with her when she walks, as patient has limited vision and is at greater risk for falls.   Patient voiced understanding, also agreeing to take her cell phone with her, in the event that she runs into trouble.  CSW also encouraged patient to wear her wrist blood pressure monitor so that she can keep an eye on her heart rate, pulse, oxygen saturation level and blood pressure. CSW and patient agreed that patient would begin walking for 20 minutes per day, at least 3 days per week.  Patient is able to set the pace, with the expectation that after the first month, she will increase  the pace and frequency to a level that is comfortable to her.  Patient reported that this goal is attainable and that she would have positive results to report to [place] and RNCM, Kathie Rhodes, also with [facility].  CSW encouraged patient to keep a record of her exercise program so that she and CSW can track her progress.  CSW provided patient with fresh vegetables from the [employer], which included cucumbers, squash, zucchini, snapped green beans, asparagus and tomatoes.  Patient reported that she plans to boil and bake her vegetables, not fry them. CSW noted that patient's blood sugars continue to fluctuate, running high mostly in the mornings.  CSW inquired about patient's water intake and as to whether or not patient has been consuming 64 fluid ounces per day, as instructed.  Patient denied, reporting "It's hard to get all that water down".  CSW encouraged patient to continue to do the best she can, putting lemon or lime in the water to add a little flavor.  Patient was receptive to the suggestion, indicating that she just needed to "mix things up a bit".  Patient admitted to being "exhausted this week", after having volunteered with her church several days last week.  Patient stated, "I slept all day on Monday, that's how tired I was".  CSW reminded patient that once she starts exercising more frequently, she will build her stamina and be able to become more active without feeling the  affects for several days.  Plan:   CSW will fax a correspondence letter to patient's Primary Care Physician, Dr. [name] to ensure that Dr. Nori Riis is aware of CSW's involvement with patient. CSW will meet with patient for the next routine home visit on Wednesday, October 19th at [name] will prescribe and print EMMI information to review with patient at the next scheduled home visit. CSW will converse with Kathie Rhodes, RNCM with [facility], to report findings of routine home visit with patient today.  Nat Christen, BSW, MSW, LCSW  Licensed Education officer, [facility]  Mailing [place] [address], [place], [place] [address] Physical Address-[address], [place], [place] [address] Toll Free Main # [phone] Fax # [phone] Cell # [phone]  Fax # [phone]  [email or web address]

## 2015-05-18 ENCOUNTER — Encounter: Payer: Self-pay | Admitting: Family Medicine

## 2015-05-18 ENCOUNTER — Ambulatory Visit (INDEPENDENT_AMBULATORY_CARE_PROVIDER_SITE_OTHER): Payer: Commercial Managed Care - HMO

## 2015-05-18 DIAGNOSIS — J309 Allergic rhinitis, unspecified: Secondary | ICD-10-CM | POA: Diagnosis not present

## 2015-05-18 DIAGNOSIS — Z23 Encounter for immunization: Secondary | ICD-10-CM | POA: Diagnosis not present

## 2015-05-18 NOTE — Telephone Encounter (Signed)
Faxed [phone]

## 2015-05-19 ENCOUNTER — Other Ambulatory Visit: Payer: Self-pay | Admitting: Family Medicine

## 2015-05-20 IMAGING — CR DG CHEST 2V
2 series · 2 of 2 positions shown · non-contrast
Comparison: DG THORACIC SPINE W/SWIMMERS dated 10/12/2012

CLINICAL DATA: asthma, former smoker

EXAM:
CHEST  2 VIEW

[view not recorded (1 of 2)]
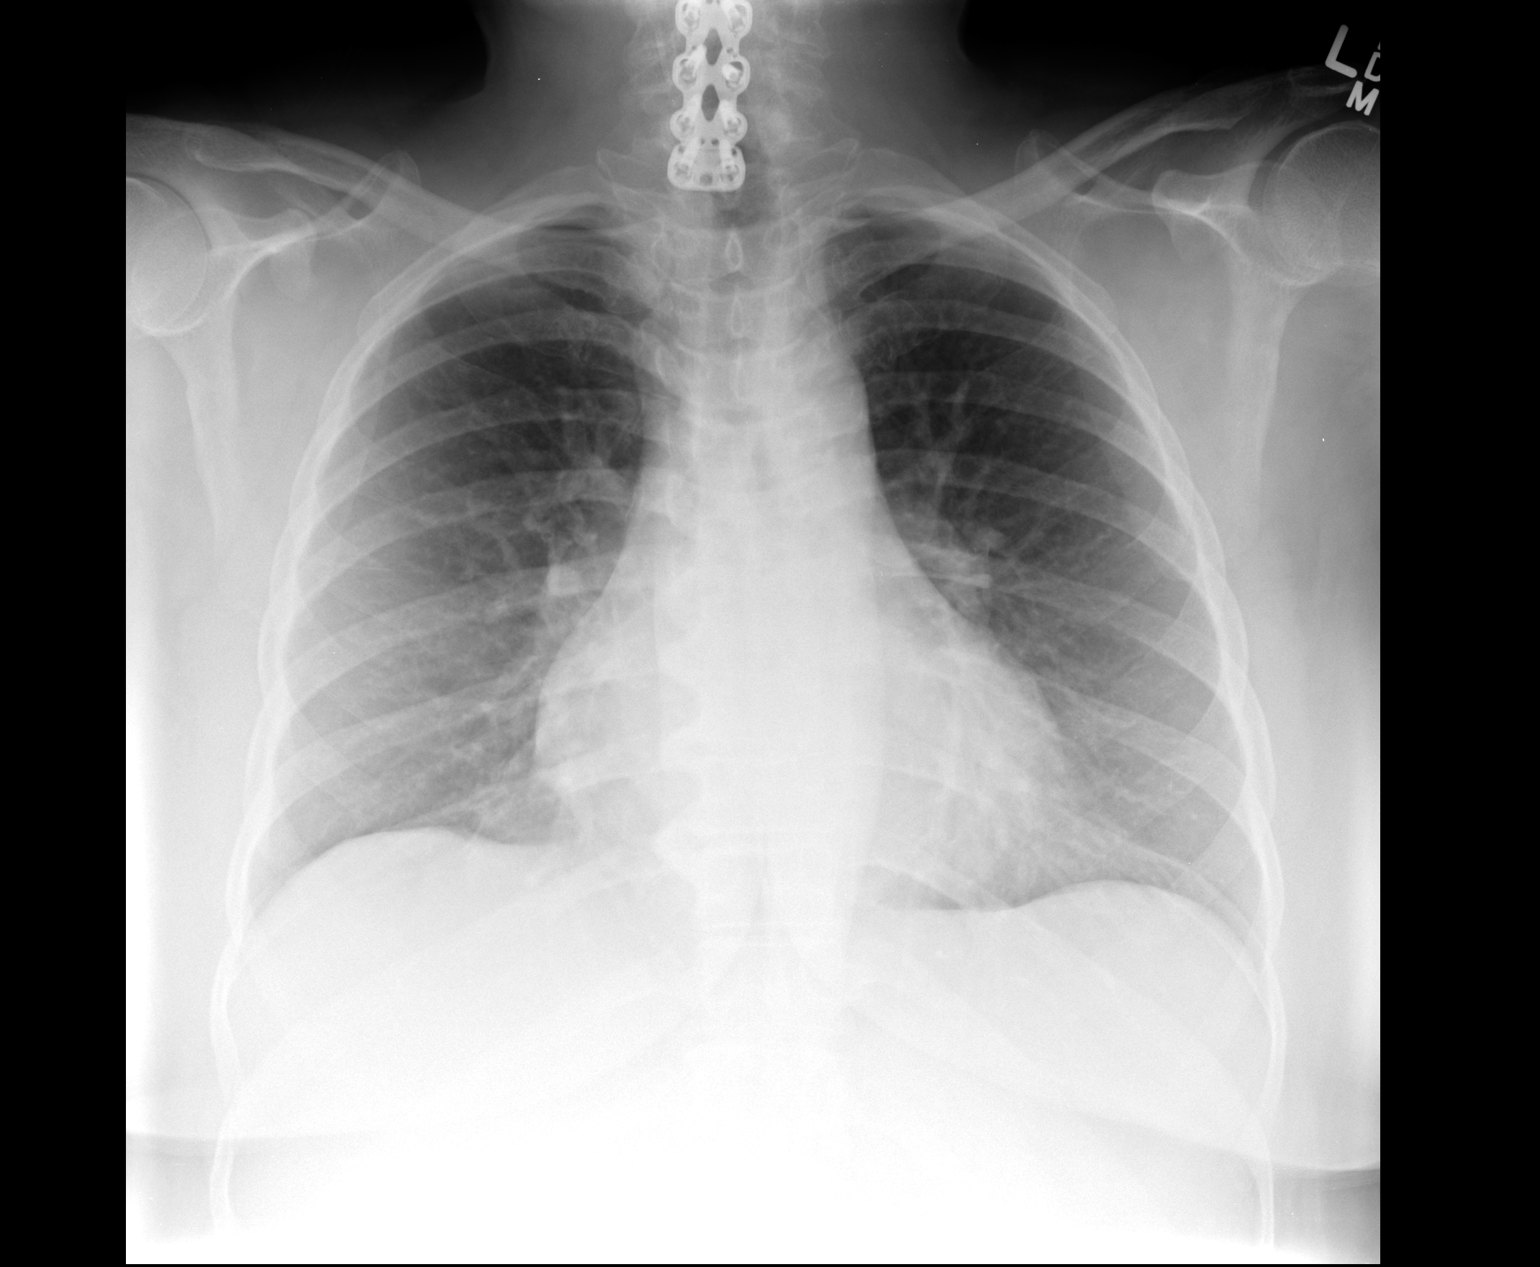

[view not recorded (2 of 2)]
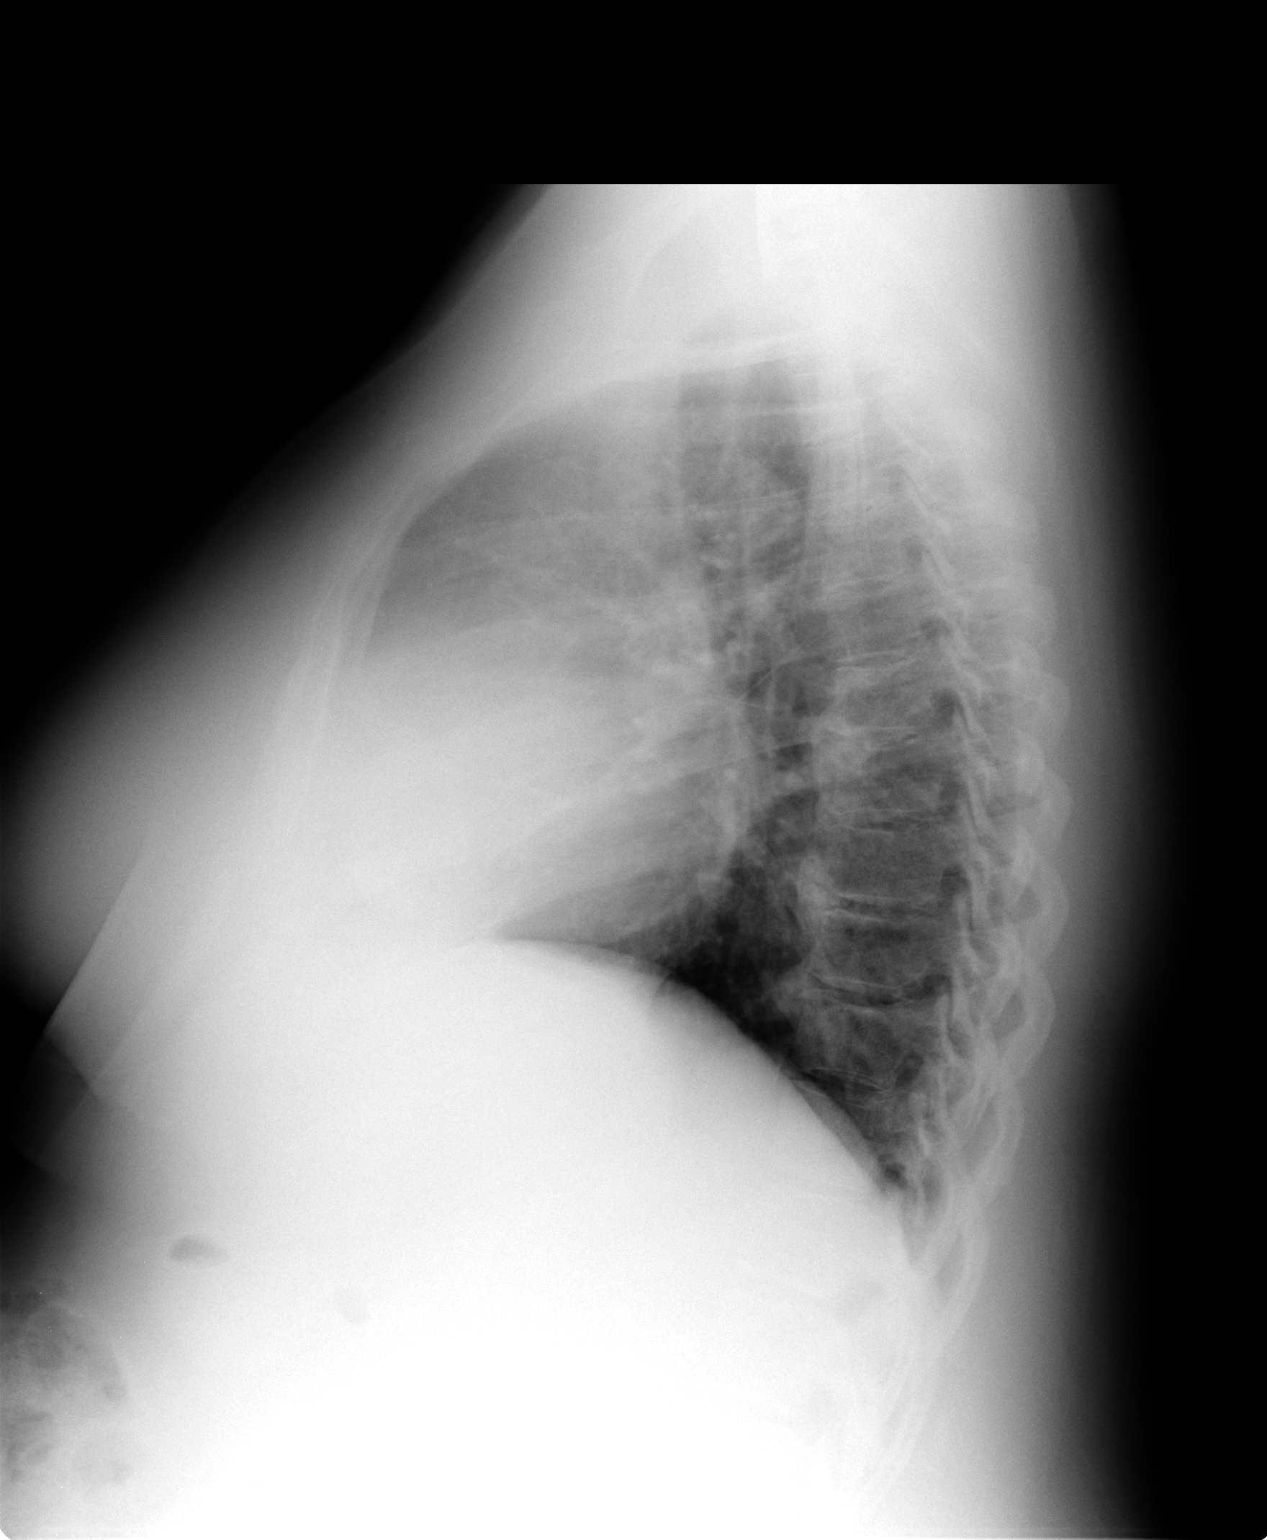

[2 of 2 positions shown; findings below may reference images not displayed]

FINDINGS: Anterior cervical fusion noted. Normal mediastinum and cardiac
silhouette. Normal pulmonary vasculature. No evidence of effusion,
infiltrate, or pneumothorax. No acute bony abnormality. Degenerative
osteophytosis of the thoracic spine.
IMPRESSION: No acute cardiopulmonary process.

## 2015-05-25 ENCOUNTER — Other Ambulatory Visit: Payer: Self-pay

## 2015-05-25 ENCOUNTER — Ambulatory Visit (INDEPENDENT_AMBULATORY_CARE_PROVIDER_SITE_OTHER): Payer: Commercial Managed Care - HMO

## 2015-05-25 DIAGNOSIS — J309 Allergic rhinitis, unspecified: Secondary | ICD-10-CM

## 2015-05-25 DIAGNOSIS — Z1231 Encounter for screening mammogram for malignant neoplasm of breast: Secondary | ICD-10-CM

## 2015-05-29 DIAGNOSIS — G4733 Obstructive sleep apnea (adult) (pediatric): Secondary | ICD-10-CM | POA: Diagnosis not present

## 2015-05-29 DIAGNOSIS — J383 Other diseases of vocal cords: Secondary | ICD-10-CM | POA: Diagnosis not present

## 2015-05-29 DIAGNOSIS — J45909 Unspecified asthma, uncomplicated: Secondary | ICD-10-CM | POA: Diagnosis not present

## 2015-05-29 DIAGNOSIS — J449 Chronic obstructive pulmonary disease, unspecified: Secondary | ICD-10-CM | POA: Diagnosis not present

## 2015-05-30 NOTE — Telephone Encounter (Signed)
LM for pt to call the office. Ottis Stain, CMA

## 2015-06-01 ENCOUNTER — Other Ambulatory Visit: Payer: Self-pay | Admitting: *Deleted

## 2015-06-01 ENCOUNTER — Ambulatory Visit (INDEPENDENT_AMBULATORY_CARE_PROVIDER_SITE_OTHER): Payer: Commercial Managed Care - HMO

## 2015-06-01 DIAGNOSIS — J309 Allergic rhinitis, unspecified: Secondary | ICD-10-CM | POA: Diagnosis not present

## 2015-06-01 NOTE — Patient Outreach (Signed)
Received a call from pt who states was talking to [place] about her BP readings, SW said to  call RN CM.   Pt reports checked BP today with two different BP machines (wrist and arm), results with wrist at 6:30am - 116/63 HR 74, five minutes later 107/53 HR 78 and with arm BP machine 114/65 HR 75 at 8:22 am.  Pt states 2 days ago BP was 129/63, 119/47 HR 81.  RN CM reviewed pt's previous BP readings with her, comparing what was reported today- not out of range.  RN CM discussed importance of hydration to which states she is trying, not dizzy.     Pt to continue to check BP often/record, call RN CM if needed.   Pt to continue to stay hydrated.  Plan to f/u with pt 1010- home visit.   Zara Chess.   [facility] Care Management  [phone]

## 2015-06-05 ENCOUNTER — Encounter: Payer: Self-pay | Admitting: Internal Medicine

## 2015-06-08 ENCOUNTER — Ambulatory Visit (INDEPENDENT_AMBULATORY_CARE_PROVIDER_SITE_OTHER): Payer: Commercial Managed Care - HMO

## 2015-06-08 DIAGNOSIS — J309 Allergic rhinitis, unspecified: Secondary | ICD-10-CM | POA: Diagnosis not present

## 2015-06-14 ENCOUNTER — Other Ambulatory Visit: Payer: Self-pay | Admitting: *Deleted

## 2015-06-14 ENCOUNTER — Ambulatory Visit: Payer: Self-pay | Admitting: *Deleted

## 2015-06-14 NOTE — Patient Outreach (Signed)
RNCM returned call to pt. Pt stated she was not sick or in need of a nurse she was just concerned that Rose her regular [facility] CM had not come this am. Explained to pt Rose was out and would follow up with her to reschedule when she returned. Pt in good spirits and denied any needs at this time. RNCM gave pt her number and requested she call if any needs arose.  Plan RNCM will report to [place] pt is aware she will be following up.  Rutherford Limerick RN, BSN  [facility] Care Management [phone])

## 2015-06-15 ENCOUNTER — Ambulatory Visit (INDEPENDENT_AMBULATORY_CARE_PROVIDER_SITE_OTHER): Payer: Commercial Managed Care - HMO

## 2015-06-15 DIAGNOSIS — J309 Allergic rhinitis, unspecified: Secondary | ICD-10-CM

## 2015-06-19 ENCOUNTER — Telehealth: Payer: Self-pay | Admitting: Family Medicine

## 2015-06-19 ENCOUNTER — Encounter: Payer: Self-pay | Admitting: Family Medicine

## 2015-06-19 ENCOUNTER — Ambulatory Visit (INDEPENDENT_AMBULATORY_CARE_PROVIDER_SITE_OTHER): Payer: Commercial Managed Care - HMO | Admitting: Family Medicine

## 2015-06-19 VITALS — BP 121/58 | HR 78 | Temp 98.0°F | Wt 198.0 lb

## 2015-06-19 DIAGNOSIS — J45909 Unspecified asthma, uncomplicated: Secondary | ICD-10-CM

## 2015-06-19 DIAGNOSIS — J449 Chronic obstructive pulmonary disease, unspecified: Secondary | ICD-10-CM | POA: Diagnosis not present

## 2015-06-19 MED ORDER — PREDNISONE 20 MG PO TABS: ORAL_TABLET | ORAL | Status: DC

## 2015-06-19 MED ORDER — HYDROCORTISONE 2.5 % RE CREA: 1.0000 "application " | TOPICAL_CREAM | Freq: Two times a day (BID) | RECTAL | Status: DC

## 2015-06-19 NOTE — Telephone Encounter (Signed)
[facility]  Derl Barrow, RN

## 2015-06-19 NOTE — Telephone Encounter (Signed)
Dear [name] Team Tell her I am sorry but there is NOT a way to WAIVE the copay. In fact, medicare or medicaid would have my license if I accept their insurance but waive the copy. If she wouldlike to skip this visit, I wil try to handle whatever Ican through phone or my chart. Is there anything special she was going to see me about? Maybe we can start spacing her visits out a little more. Let me know THANKS! [name]

## 2015-06-19 NOTE — Progress Notes (Signed)
COUGH  Has been coughing for 5 days. Cough is: dry Sputum production: no Medications tried: cough syrup Taking blood pressure medications: spiro, lasix, cozaar, metop  Symptoms Runny nose: yes Mucous in back of throat: no Throat burning or reflux: no Wheezing or asthma: yes Fever: no Chest Pain: no Shortness of breath: mild Leg swelling: no Hemoptysis: no Weight loss: no  ROS see HPI Smoking Status noted  Objective: BP 121/58 mmHg  Pulse 78  Temp(Src) 98 F (36.7 C) (Oral)  Wt 198 lb (89.812 kg)  SpO2 94% Gen: NAD, alert, cooperative, and pleasant. HEENT: NCAT, EOMI, PERRL CV: RRR, no murmur Resp: CTAB, end-expiratory wheeze present, non-labored Ext: No edema, warm Neuro: Alert and oriented, Speech clear, No gross deficits  Assessment and plan:  COPD with asthma Patient's signs and symptoms and history make COPD exacerbation most likely cause. Patient's lack of sputum production or fever make infectious cause less likely. Because of this the decision to withhold from antibiotic treatment has been made. However, a low threshold to initiate antibiotic coverage should be considered. - No hypoxia noted with ambulation. - 5 days prednisone 50 mg by mouth. - Follow-up 7-10 days if symptoms persist or worsen.   Meds ordered this encounter  Medications  . DISCONTD: predniSONE (DELTASONE) 20 MG tablet    Sig: Take 2 tablets (40mg ) a day until the bottle is finished    Dispense:  10 tablet    Refill:  0     Elberta Leatherwood, MD,MS,  PGY2 06/20/2015 6:16 PM

## 2015-06-19 NOTE — Telephone Encounter (Signed)
Dear [name] Team I sent in some annusol cream---use ittwice daily WITH THE APPLICATOR--it goes into rectum. If not better in a week let me know ALSO use stool softenr (OTC) I sent the rx to the [facility] mail delivery pharmacy---if taht is not where she wants it sent let me know.  THANKS!  [name]

## 2015-06-19 NOTE — Patient Instructions (Signed)
Cough Treatment - you should:   - continue taking your COPD medications  - take the medication as prescribed. 2 tablets once a day. This medicine can cause your blood sugars to elevate so be mindful of this response.  You should be better in: 5-7 days  Call us if you have severe shortness of breath, increased sputum production, high fever or are not better in 2 weeks.

## 2015-06-19 NOTE — Telephone Encounter (Signed)
Called pt. Gave her the information below. Pt stated that her lower area hurts when she sits, laying in the bed on her back, hurts when she tries to get up out of a chair. Pain with BM but no pain with urination. Ottis Stain, CMA

## 2015-06-19 NOTE — Telephone Encounter (Signed)
[facility] [address] and states that she was seen today by Dr. Alease Frame and she needs the prednisone today. She states that the pharmacy it was sent to is a mail delivery and it would take her 2-3 days before she actually gets the medication and can start it. She states that [facility] is [facility]. Sadie Reynolds, ASA

## 2015-06-19 NOTE — Telephone Encounter (Signed)
Pt would like for PCP to contact her at the earliest convenience. She fears that she will not have her copay on the date of service coming and she would like to speak with her provider about it. She states that she never pays her co-pay because she never has it. [name] [name], ASA

## 2015-06-20 NOTE — Telephone Encounter (Signed)
Called pt. She was not home. Was told not sure when she will be back. I was told to try back this afternoon. [name] [name], CMA

## 2015-06-20 NOTE — Assessment & Plan Note (Signed)
Patient's signs and symptoms and history make COPD exacerbation most likely cause. Patient's lack of sputum production or fever make infectious cause less likely. Because of this the decision to withhold from antibiotic treatment has been made. However, a low threshold to initiate antibiotic coverage should be considered. - No hypoxia noted with ambulation. - 5 days prednisone 50 mg by mouth. - Follow-up 7-10 days if symptoms persist or worsen.

## 2015-06-21 ENCOUNTER — Ambulatory Visit: Payer: Self-pay | Admitting: *Deleted

## 2015-06-21 ENCOUNTER — Other Ambulatory Visit: Payer: Self-pay | Admitting: *Deleted

## 2015-06-21 ENCOUNTER — Encounter: Payer: Self-pay | Admitting: *Deleted

## 2015-06-21 NOTE — Patient Outreach (Signed)
[facility]) Care Management  [facility] Social Work  06/21/2015  [name] [name] 02-23-1951 [id number]  Subjective:    "I need a continuation of completing my goal, I have not been able to exercise because of my pain".   Objective:   CSW agreed to continue to work with patient on goal completion, as patient reports that she has been unable to exercise due to rectal pain.  Patient has been instructed not to bend, lift or lay flat on her back and to try and avoid further strain and irritation.  Current Medications:  Current Outpatient Prescriptions  Medication Sig Dispense Refill  . acetaminophen (TYLENOL) 325 MG tablet Take 650 mg by mouth every 6 (six) hours as needed (pain).     [name] acetaminophen-codeine (TYLENOL #3) 300-30 MG per tablet Take 1 tablet by mouth daily as needed (pain).     [name] acyclovir (ZOVIRAX) 200 MG capsule Take 200 mg by mouth 2 (two) times daily.    [name] albuterol (PROVENTIL HFA;VENTOLIN HFA) 108 (90 BASE) MCG/ACT inhaler Inhale 2 puffs into the lungs every 6 (six) hours as needed for wheezing or shortness of breath. 3 Inhaler 3  . albuterol (PROVENTIL) (2.5 MG/3ML) 0.083% nebulizer solution Take 2.5 mg by nebulization every 6 (six) hours as needed for wheezing or shortness of breath.    . Alcohol Swabs (B-D SINGLE USE SWABS REGULAR) PADS USE THREE TIMES DAILY 300 each 3  . allopurinol (ZYLOPRIM) 300 MG tablet TAKE 1 TABLET EVERY DAY (Patient taking differently: TAKE 1 TABLET BY MOUTH EVERY DAY) 90 tablet 3  . ammonium lactate (LAC-HYDRIN) 12 % lotion Apply 1 application topically 2 (two) times daily as needed for dry skin.    [name] aspirin EC 81 MG tablet Take 81 mg by mouth daily.    [name] atorvastatin (LIPITOR) 80 MG tablet Take 1 tablet (80 mg total) by mouth daily. 90 tablet 3  . azelastine (ASTELIN) 0.1 % nasal spray Place 2 sprays into both nostrils daily. Use in each nostril as directed    . B-D ULTRAFINE III SHORT PEN 31G X 8 MM MISC USE THREE TIMES  DAILY 270 each 3  . beclomethasone (QVAR) 80 MCG/ACT inhaler Inhale 2 puffs into the lungs daily.    . bisacodyl (DULCOLAX) 5 MG EC tablet Take 5 mg by mouth daily as needed. Pt takes 1-2 tablets once a week    . Blood Glucose Monitoring Suppl (ACCU-CHEK AVIVA PLUS) W/DEVICE KIT Use to check blood sugar 3-4 times daily 1 kit 0  . brimonidine (ALPHAGAN P) 0.1 % SOLN Place 1 drop into the left eye 2 (two) times daily.     [name] buPROPion (WELLBUTRIN) 100 MG tablet Take 1 tablet (100 mg total) by mouth daily after breakfast. 90 tablet 0  . EPINEPHrine 0.3 mg/0.3 mL IJ SOAJ injection Inject 0.3 mLs (0.3 mg total) into the muscle once as needed (for allergic reaction). 1 Device 3  . esomeprazole (NEXIUM) 40 MG capsule Take 1 capsule (40 mg total) by mouth daily. 90 capsule 3  . ferrous sulfate 325 (65 FE) MG tablet Take 325 mg by mouth daily.    . furosemide (LASIX) 40 MG tablet Take 40 mg by mouth 2 (two) times daily.    [name] gabapentin (NEURONTIN) 300 MG capsule Take 300 mg by mouth daily as needed (pain).    [name] glucose blood test strip Test blood sugar 3-4 times daily 400 each 12  . hydrocortisone (ANUSOL-HC) 2.5 % rectal cream Place  1 application rectally 2 (two) times daily. 30 g 2  . Insulin Glargine (LANTUS SOLOSTAR) 100 UNIT/ML Solostar Pen INJECT  65 UNITS SUBCUTANEOUSLY EVERY DAY 65 mL 3  . Lancets MISC Use twice or three times daily to check blood sugar 200 each 12  . latanoprost (XALATAN) 0.005 % ophthalmic solution Place 1 drop into both eyes at bedtime.    [name] losartan (COZAAR) 100 MG tablet TAKE 1 TABLET EVERY DAY (Patient taking differently: TAKE 1 TABLET BY MOUTH  EVERY DAY) 90 tablet 3  . metoprolol succinate (TOPROL-XL) 50 MG 24 hr tablet Take 3 tablets (150 mg total) by mouth daily. Take with or immediately following a meal. 270 tablet 3  . montelukast (SINGULAIR) 10 MG tablet TAKE 1 TABLET EVERY DAY (Patient taking differently: TAKE 1 TABLET BY MOUTH EVERY DAY) 90 tablet 3  . Multiple  Vitamins-Minerals (MULTIVITAMIN PO) Take 1 tablet by mouth daily.    . nitroGLYCERIN (NITROSTAT) 0.4 MG SL tablet Place 0.4 mg under the tongue every 5 (five) minutes as needed for chest pain.    . NONFORMULARY OR COMPOUNDED ITEM Allergy Vaccine 1:10 Given at [facility]    . NOVOLOG FLEXPEN 100 UNIT/ML FlexPen INJECT 30 UNITS SUBCUTANEOUSLY WITH THE EARLY MEAL AND 30 UNITS WITH THE LATE MEAL 60 mL 5  . predniSONE (DELTASONE) 20 MG tablet Take 2 tablets (1m) a day until the bottle is finished 10 tablet 0  . salicylic acid 17 % gel Apply topically 2 (two) times daily. Apply small amount to callous area on bottom of foot BID (Patient not taking: Reported on 05/11/2015) 14 g 3  . sertraline (ZOLOFT) 100 MG tablet TAKE 1 AND 1/2 TABLETS AT BEDTIME 135 tablet 3  . spironolactone (ALDACTONE) 25 MG tablet Take 1 tablet (25 mg total) by mouth daily before supper. 90 tablet 3  . TRULICITY 08.41MLK/4.4WNSOPN INJECT 0.75 MG INTO THE SKIN ONE TIME A WEEK. 4 pen 3  . Vitamin D, Ergocalciferol, (DRISDOL) 50000 UNITS CAPS capsule Take 1 capsule (50,000 Units total) by mouth every 30 (thirty) days. Take on 3rd of the month     No current facility-administered medications for this visit.    Functional Status:  In your present state of health, do you have any difficulty performing the following activities: 06/21/2015 05/17/2015  Hearing? N N  Vision? Y Y  Difficulty concentrating or making decisions? N N  Walking or climbing stairs? Y N  Dressing or bathing? N N  Doing errands, shopping? N N  Preparing Food and eating ? N N  Using the Toilet? N N  In the past six months, have you accidently leaked urine? N N  Do you have problems with loss of bowel control? Y N  Managing your Medications? N Y  Managing your Finances? Y N  Housekeeping or managing your Housekeeping? YTempie Donning   Fall/Depression Screening:  PHQ 2/9 Scores 06/21/2015 06/19/2015 05/17/2015 04/19/2015 04/12/2015 03/29/2015 03/17/2015  PHQ - 2  Score 0 0 0 0 0 0 0    Assessment:   CSW was able to meet with patient today to perform a routine home visit.  Patient was lying back in her recliner when CSW arrived.  Patient admitted to experiencing a great deal of pain today in her rectal area.  Patient indicated that she has notified her Primary Care Physician, Dr. SDorcas Mcmurrayand that an appointment has been scheduled for Wednesday, [name] 26th.  Dr. NNori Riiswould have been able to  schedule patient for a visit much sooner; however, patient reports that she is unable to afford the $10.00 co-pay, hoping that she will be able to borrow the money from a family member between now and then.  Patient admitted that her budget gets very tight toward the end of the month and when something unexpected comes up, she typically does not have the money to pay.  Patient requested that CSW provide her with additional [facility]) passes, as patient does not have transportation assistance and/or services, otherwise.  Five additional [facility] passes were provided to patient.  CSW is aware that patient needs to be transported to and from [facility] tomorrow (Thursday, [name] 20th) for her follow-up appointment, in addition to appointments she already has scheduled with [facility] on [name] 20th, Dr. [name] on [name] 26th and [facility] on November 9th. Patient reported that she has been unable to work toward completing her goal of exercising on a routine basis, due to the pain she is experiencing in her rectum.  Patient admitted that she has not exercised at all within the last two weeks, due to the severity of the pain.  Patient has also been instructed to try not to bend, stretch, lift, or sit upright, for fear that she may strain or further exasperate her symptoms.  Patient denies having blood in her stools or being constipated.  Patient indicated that the pain came with sudden onset  and that she has never experienced anything like this before.  Patient is unaware of what may have precipitated this event, but that her symptoms are gradually getting worse.  No additional social work needs have been identified at this time.  Patient will contact CSW to report findings of visit with Dr. Nori Riis next week.  Plan:   CSW will meet with patient for the next routine home visit on Wednesday, November 16th at 9:00am to assess for any additional social work needs and services, as well as ensure patient is making progress toward goal completion. CSW will converse with patient's RNCM with [facility], Kathie Rhodes to report findings of home visit with patient today. CSW will fax a correspondence letter to patient's primary Care Physician, Dr. [name] to provide an update on social work involvement with patient.  Nat Christen, BSW, MSW, LCSW  Licensed Education officer, [facility]  Mailing [place] [address], [place], [place] [address] Physical Address-[address], [place], [place] [address] Toll Free Main # [phone] Fax # [phone] Cell # [phone]  Fax # [phone]  [email or web address]

## 2015-06-22 ENCOUNTER — Encounter (HOSPITAL_COMMUNITY): Payer: Self-pay | Admitting: Psychiatry

## 2015-06-22 ENCOUNTER — Encounter: Payer: Self-pay | Admitting: Internal Medicine

## 2015-06-22 ENCOUNTER — Ambulatory Visit (INDEPENDENT_AMBULATORY_CARE_PROVIDER_SITE_OTHER): Payer: Commercial Managed Care - HMO | Admitting: Internal Medicine

## 2015-06-22 ENCOUNTER — Telehealth: Payer: Self-pay | Admitting: Internal Medicine

## 2015-06-22 ENCOUNTER — Ambulatory Visit (INDEPENDENT_AMBULATORY_CARE_PROVIDER_SITE_OTHER): Payer: Commercial Managed Care - HMO

## 2015-06-22 ENCOUNTER — Ambulatory Visit (INDEPENDENT_AMBULATORY_CARE_PROVIDER_SITE_OTHER): Payer: Medicare HMO | Admitting: Psychiatry

## 2015-06-22 VITALS — BP 114/68 | HR 75 | Temp 97.8°F | Ht 61.0 in | Wt 204.0 lb

## 2015-06-22 VITALS — BP 138/81 | HR 78 | Ht 61.0 in | Wt 203.6 lb

## 2015-06-22 DIAGNOSIS — F411 Generalized anxiety disorder: Secondary | ICD-10-CM

## 2015-06-22 DIAGNOSIS — J45991 Cough variant asthma: Secondary | ICD-10-CM | POA: Diagnosis not present

## 2015-06-22 DIAGNOSIS — R053 Chronic cough: Secondary | ICD-10-CM

## 2015-06-22 DIAGNOSIS — F331 Major depressive disorder, recurrent, moderate: Secondary | ICD-10-CM | POA: Diagnosis not present

## 2015-06-22 DIAGNOSIS — J309 Allergic rhinitis, unspecified: Secondary | ICD-10-CM | POA: Diagnosis not present

## 2015-06-22 DIAGNOSIS — R05 Cough: Secondary | ICD-10-CM | POA: Diagnosis not present

## 2015-06-22 MED ORDER — PREDNISONE 10 MG PO TABS: ORAL_TABLET | ORAL | Status: DC

## 2015-06-22 MED ORDER — BUPROPION HCL 100 MG PO TABS: 100.0000 mg | ORAL_TABLET | Freq: Two times a day (BID) | ORAL | Status: DC

## 2015-06-22 MED ORDER — SERTRALINE HCL 100 MG PO TABS: ORAL_TABLET | ORAL | Status: DC

## 2015-06-22 NOTE — Progress Notes (Signed)
Patient ID: [name] [name], female    DOB: 08/15/1951, 64 y.o.   MRN: [id number]   Brief patient profile:   27 yobf never regular smoker quit in the 1977   followed for asthma, sleep apnea, complicated by hx depression, vocal cord paralysis, GERD.  History of Present Illness  Ov  01/24/11  Seen  August 17, 2010- note reviewed  Doesn't wear CPAP- can't tolerate it on her face. Didn't like nasal pillows or her current nasal mask. Has own teeth. Aware she tosses and turns at night. CPAP 12 Advanced.  Denies recent asthma attacks. Uses rescue inhaler 1-2x/ month. Continues allergy shots and says she breathes better and coughs less since she has been on them.  No longer chokes or coughs much with eating as she used to. No recent colds.   07/31/11-   followed for asthma, sleep apnea, complicated by hx depression, vocal cord paralysis, GERD. Has had flu vaccine. Continues CPAP at 12 CWP,  used all night every night. She had moved, and can't find her nebulizer machine. We discussed whether she would use it this winter. She continues allergy vaccine and believes that has helped.  03/01/13- followed for asthma/ COPD, sleep apnea, complicated by hx depression, vocal cord paralysis, GERD. FOLLOWS FOR: has good and bad days since no longer on vaccine; Wears CPAP 12/ Advanced every night for about 8-9 hours; pressure working well for patient. Neck pain s/p c-spine fusion, ,imits comfortable sleep position with CPAP. Occ wheeze and persistent nose and chest congestion despite meds. Would like to restart allergy vaccine- discussed. Thinks she is mobile enough with [facility] transport to get here.  PFT 2011 documented moderate obstruction w/ little response to bronchodilator.   07/01/13-   followed for asthma/ COPD, sleep apnea, complicated by hx depression, vocal cord paralysis, GERD. FOLLOWS FOR: wears CPAP 12/ Advanced every night for abour 8 or more hours; pressure doing well for patient. Still on allergy  vaccine; noticed she had some bumps on arms after her injections(varies). Has noticed that about 5-10 minutes after Brovana treatment she has jitters and nervous feelings. Holding vaccine dose at 1:5000 [place]. She notices minor bumps but may be unrelated.  01/03/14-  r followed for asthma/ COPD, sleep apnea, complicated by hx depression, vocal cord paralysis, GERD. FOLLOWS FOR: continues to wear CPAP12/[employer] every night for about 5-9 hours; pressure is doing well; DME is [employer]. Pt is still on vaccine and doing well. CPAP doing well.  Allergy vaccine 1:50 GH, holding. Jaw pain w/ sustained walking in January. Sees Dr [name] [name]/ Cards. Cath neg.  07/06/14-  followed for asthma/ COPD, sleep apnea, complicated by hx depression, vocal cord paralysis, GERD FOLLOWS FOR: Pt taking allergy vaccine and is going well. Pt c/o dizziness that presented today. Pt had unsteady gate when walking to exam room and needed wheelchair. Pt states she is wearing her CPAP12/[facility] nightly for 63-8 hours. Pt denies issues with mask, pressure or machine.  Allergy vaccine 1:10 GH, holding. Has been feeling dizzy/unsteady. This is not new and has been intermittent over at least the last couple of years. She thinks she is doing well with allergy vaccine and seeks no changes Describes good compliance and control with CPAP CXR 05/25/14 IMPRESSION: No radiographic evidence of acute cardiopulmonary disease. Signed, [name] [name]. [name] Newport, DO [facility] Electronically Signed  By: [name] [name] O.D.  On: 05/25/2014 21:47  01/03/15-  Dr [name] [name]  followed for asthma/ COPD, sleep apnea, complicated by  hx depression, vocal cord paralysis, GERD FOLLOWS FOR: Pt still on vaccine and doing well; no issues with vaccine. Pt states she wears CPAP 12/ [employer]  every night for about 4-9 hours.  Allergy vaccine 1:10 [place] She feels she is doing well both both therapies.  rec We can continue  allergy vaccine 1:10 GH  We can continue CPAP 12/ [employer] Script to try Cheracol cough syrup    06/22/2015  f/u ov/[name] [name] re: acute on chronic cough ? Etiology  Chief Complaint  Patient presents with  . Acute Visit    Pt c/o chest congestion, chest tightness and cough for the past wk. She has also noticed more wheezing. She is using albuterol inhaler 2 x daily on average.      Even when fine needs dose of narcotic cough med at hs despite maint rx with   singulair and qvar 80 2 pffs in am then prn at hs  Acutely worse x one week assoc with sense of chest congestion and tightness but no excess or purulent mucus production  No obvious day to day or daytime variability or assoc  cp or  subjective wheeze or overt sinus or hb symptoms. No unusual exp hx or h/o childhood pna/ asthma or knowledge of premature birth.  Sleeping ok p narcs without nocturnal  or early am exacerbation  of respiratory  c/o's or need for noct saba. Also denies any obvious fluctuation of symptoms with weather or environmental changes or other aggravating or alleviating factors except as outlined above   Current Medications, Allergies, Complete Past Medical History, Past Surgical History, Family History, and Social History were reviewed in [employer] record.  ROS  The following are not active complaints unless bolded sore throat, dysphagia, dental problems, itching, sneezing,  nasal congestion or excess/ purulent secretions, ear ache,   fever, chills, sweats, unintended wt loss, classically pleuritic or exertional cp, hemoptysis,  orthopnea pnd or leg swelling, presyncope, palpitations, abdominal pain, anorexia, nausea, vomiting, diarrhea  or change in bowel or bladder habits, change in stools or urine, dysuria,hematuria,  rash, arthralgias, visual complaints, headache, numbness, weakness or ataxia or problems with walking or coordination,  change in mood/affect or memory.              Objective:    Physical Exam  amb obese hoarse bf harsh upper airway coughing    Wt Readings from Last 3 Encounters:  06/22/15 204 lb (92.534 kg)  06/19/15 198 lb (89.812 kg)  05/11/15 200 lb (90.719 kg)    Vital signs reviewed   HEENT: nl dentition, turbinates, and orophanx. Nl external ear canals without cough reflex   NECK :  without JVD/Nodes/TM/ nl carotid upstrokes bilaterally   LUNGS: no acc muscle use, clear to A and P bilaterally without cough on insp or exp maneuvers   CV:  RRR  no s3 or murmur or increase in P2, no edema   ABD:  soft and nontender with nl excursion in the supine position. No bruits or organomegaly, bowel sounds nl  MS:  warm without deformities, calf tenderness, cyanosis or clubbing  SKIN: warm and dry without lesions    NEURO:  alert, approp, no deficits     No recent cxr on file

## 2015-06-22 NOTE — Patient Instructions (Addendum)
Take delsym two tsp every 12 hours and supplement if needed with  Tylenol #3  up to 2 every 4 hours to suppress the urge to cough. Swallowing water or using ice chips/non mint and menthol containing candies (such as lifesavers or sugarless jolly ranchers) are also effective.  You should rest your voice and avoid activities that you know make you cough.  Once you have eliminated the cough for 3 straight days try reducing the tylenol #3 first,  then the delsym as tolerated.    Nexium  40 mg   Take  30-60 min before first meal of the day and Pepcid (famotidine)  20 mg one @  bedtime until return to office - this is the best way to tell whether stomach acid is contributing to your problem.    GERD (REFLUX)  is an extremely common cause of respiratory symptoms just like yours , many times with no obvious heartburn at all.    It can be treated with medication, but also with lifestyle changes including elevation of the head of your bed (ideally with 6 inch  bed blocks),  Smoking cessation, avoidance of late meals, excessive alcohol, and avoid fatty foods, chocolate, peppermint, colas, red wine, and acidic juices such as orange juice.  NO MINT OR MENTHOL PRODUCTS SO NO COUGH DROPS  USE SUGARLESS CANDY INSTEAD (Jolley ranchers or Stover's or Life Savers) or even ice chips will also do - the key is to swallow to prevent all throat clearing. NO OIL BASED VITAMINS - use powdered substitutes.  Prednisone 10 mg take  4 each am x 2 days,   2 each am x 2 days,  1 each am x 2 days and stop  Qvar should be Take 2 puffs first thing in am and then another 2 puffs about 12 hours later.   Work on inhaler technique:  relax and gently blow all the way out then take a nice smooth deep breath back in, triggering the inhaler at same time you start breathing in.  Hold for up to 5 seconds if you can. Blow out thru nose. Rinse and gargle with water when done     Keep appt to see Dr Annamaria Boots

## 2015-06-22 NOTE — Progress Notes (Signed)
[facility] [phone] Progress Note  [name] [name] [id number] 64 y.o.  06/22/2015 3:56 PM  Chief Complaint: depression getting better  History of Present Illness: Reports about once a week she feels depressed due to stressors and physical health. Pt has sad mood on those days. States she is unable to cry. Denies isolation, anhedonia, low motivation, worthlessness and hopelessness. Pt tires to keep herself busy.   Sleep is variable but she uses her CPAP every night. Appetite is increased and she is eating a lot of sweets and carbs. Pt is eating 2 meals a day.  Energy is low. Concentration is ok.   Anxiety is mild and tolerable.   Pt is taking meds as prescribed and reports Wellbutrin may be causing constipation. States Zoloft isn't very effective anymore. Pt is unable to say if Wellbutrin is helping but wants to continue it for now.    Suicidal Ideation: No Plan Formed: No Patient has means to carry out plan: No  Homicidal Ideation: No Plan Formed: No Patient has means to carry out plan: No  Review of Systems: Psychiatric: Agitation: No Hallucination: No Depressed Mood: Yes Insomnia: Yes Hypersomnia: No Altered Concentration: No Feels Worthless: No Grandiose Ideas: No Belief In Special Powers: No New/Increased Substance Abuse: No Compulsions: No  Review of Systems  Constitutional: Negative for fever, chills and weight loss.  HENT: Negative for ear pain and sore throat.   Eyes: Positive for blurred vision. Negative for double vision and pain.  Respiratory: Positive for cough and wheezing. Negative for shortness of breath.   Cardiovascular: Negative for chest pain, palpitations and leg swelling.  Gastrointestinal: Positive for constipation. Negative for heartburn, nausea, vomiting and abdominal pain.  Genitourinary: Positive for flank pain.  Musculoskeletal: Positive for back pain, joint pain and neck pain.  Skin: Negative for itching and rash.   Neurological: Positive for sensory change. Negative for dizziness, seizures, loss of consciousness and headaches.  Psychiatric/Behavioral: Positive for depression. Negative for suicidal ideas, hallucinations and substance abuse. The patient has insomnia. The patient is not nervous/anxious.      Past Medical Family, Social History: She was born and raised in [place]. 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 Patient was born with bilateral congenital cataracts and had her first surgery at age 64 and her second surgery in 9. She finished high school and subsequently worked. She was never married and had her son at the age of 64 raised him as a single mother.Patient is on Social Security disability.  Social History   Social History  . Marital Status: Single    Spouse Name: N/A  . Number of Children: 1  . Years of Education: 12   Occupational History  . unemployed    Social History Main Topics  . Smoking status: Former Smoker -- 0.50 packs/day for .5 years    Types: Cigarettes    Quit date: 09/03/1975  . Smokeless tobacco: Never Used  . Alcohol Use: No  . Drug Use: No  . Sexual Activity: Not Currently   Other Topics Concern  . None   Social History Narrative   Approved for section 8 housing (12/03).         Health Care POA:    Emergency Contact: sister, catherine Tafoya [phone]   End of Life Plan:    Who lives with you: Has a son who may be autistic and stays with her at times.   Any pets: none   Diet: Pt has a varied diet of protein, starch,  and vegetables.   Exercise: Pt walks 1x a week with [place] [employer] [facility] [employer].   Seatbelts: Pt reports wearing seatbelt when in vehicles.    Hobbies: walking, tv, church, eating         Family History  Problem Relation Age of Onset  . Diabetes Mother   . Colon cancer Neg Hx     Past Medical History  Diagnosis Date  . Chronic cough     Now followed by pulmonary  . Sleep apnea     on CPAP 13  . HTN  (hypertension)   . Dyslipidemia   . Gout   . History of colonoscopy   . Concussion 11/18/11    "fell at dr's office; hit head"  . Complication of anesthesia     "just wake up coughing; that's all"  . Heart murmur   . Asthma   . COPD (chronic obstructive pulmonary disease) ([place])   . Diabetes mellitus type II     "take insulin & pills"  . Blood transfusion   . GERD (gastroesophageal reflux disease)   . Arthritis   . Chronic lower back pain   . Vocal cord paralysis, unilateral partial     "right"  . Depression   . Peripheral neuropathy ([place])   . Angina   . Pneumonia     "i've had it once" (11/18/11)  . Exertional dyspnea   . Headache(784.0) 11/18/11    "I've had mild headaches the last couple days"  . Headache(784.0) 01/08/12    "pretty constant since 11/18/11's concussion"  . Syncope 06/16/2012  . Allergy   . Anxiety   . Blood transfusion without reported diagnosis   . Cataract   . Glaucoma   . Myocardial infarction ([place])   . Osteoporosis     Outpatient Encounter Prescriptions as of 06/22/2015  Medication Sig  . acetaminophen (TYLENOL) 325 MG tablet Take 650 mg by mouth every 6 (six) hours as needed (pain).   [name] acetaminophen-codeine (TYLENOL #3) 300-30 MG per tablet Take 1 tablet by mouth daily as needed (pain).   [name] acyclovir (ZOVIRAX) 200 MG capsule Take 200 mg by mouth 2 (two) times daily.  [name] albuterol (PROVENTIL HFA;VENTOLIN HFA) 108 (90 BASE) MCG/ACT inhaler Inhale 2 puffs into the lungs every 6 (six) hours as needed for wheezing or shortness of breath.  [name] albuterol (PROVENTIL) (2.5 MG/3ML) 0.083% nebulizer solution Take 2.5 mg by nebulization every 6 (six) hours as needed for wheezing or shortness of breath.  . Alcohol Swabs (B-D SINGLE USE SWABS REGULAR) PADS USE THREE TIMES DAILY  . allopurinol (ZYLOPRIM) 300 MG tablet TAKE 1 TABLET EVERY DAY (Patient taking differently: TAKE 1 TABLET BY MOUTH EVERY DAY)  . ammonium lactate (LAC-HYDRIN) 12 % lotion Apply 1 application  topically 2 (two) times daily as needed for dry skin.  [name] aspirin EC 81 MG tablet Take 81 mg by mouth daily.  [name] atorvastatin (LIPITOR) 80 MG tablet Take 1 tablet (80 mg total) by mouth daily.  [name] azelastine (ASTELIN) 0.1 % nasal spray Place 2 sprays into both nostrils daily. Use in each nostril as directed  . B-D ULTRAFINE III SHORT PEN 31G X 8 MM MISC USE THREE TIMES DAILY  . beclomethasone (QVAR) 80 MCG/ACT inhaler Inhale 2 puffs into the lungs daily.  . bisacodyl (DULCOLAX) 5 MG EC tablet Take 5 mg by mouth daily as needed. Pt takes 1-2 tablets once a week  . Blood Glucose Monitoring Suppl (ACCU-CHEK AVIVA PLUS) W/DEVICE KIT  Use to check blood sugar 3-4 times daily  . brimonidine (ALPHAGAN P) 0.1 % SOLN Place 1 drop into the left eye 2 (two) times daily.   [name] buPROPion (WELLBUTRIN) 100 MG tablet Take 1 tablet (100 mg total) by mouth daily after breakfast.  . EPINEPHrine 0.3 mg/0.3 mL IJ SOAJ injection Inject 0.3 mLs (0.3 mg total) into the muscle once as needed (for allergic reaction).  [name] esomeprazole (NEXIUM) 40 MG capsule Take 1 capsule (40 mg total) by mouth daily.  . ferrous sulfate 325 (65 FE) MG tablet Take 325 mg by mouth daily.  . furosemide (LASIX) 40 MG tablet Take 40 mg by mouth 2 (two) times daily.  [name] glucose blood test strip Test blood sugar 3-4 times daily  . hydrocortisone (ANUSOL-HC) 2.5 % rectal cream Place 1 application rectally 2 (two) times daily.  . Insulin Glargine (LANTUS SOLOSTAR) 100 UNIT/ML Solostar Pen INJECT  65 UNITS SUBCUTANEOUSLY EVERY DAY  . Lancets MISC Use twice or three times daily to check blood sugar  . latanoprost (XALATAN) 0.005 % ophthalmic solution Place 1 drop into both eyes at bedtime.  [name] losartan (COZAAR) 100 MG tablet TAKE 1 TABLET EVERY DAY (Patient taking differently: TAKE 1 TABLET BY MOUTH  EVERY DAY)  . metoprolol succinate (TOPROL-XL) 50 MG 24 hr tablet Take 3 tablets (150 mg total) by mouth daily. Take with or immediately following a meal.  .  montelukast (SINGULAIR) 10 MG tablet TAKE 1 TABLET EVERY DAY (Patient taking differently: TAKE 1 TABLET BY MOUTH EVERY DAY)  . Multiple Vitamins-Minerals (MULTIVITAMIN PO) Take 1 tablet by mouth daily.  . nitroGLYCERIN (NITROSTAT) 0.4 MG SL tablet Place 0.4 mg under the tongue every 5 (five) minutes as needed for chest pain.  . NONFORMULARY OR COMPOUNDED ITEM Allergy Vaccine 1:10 Given at [facility]  . NOVOLOG FLEXPEN 100 UNIT/ML FlexPen INJECT 30 UNITS SUBCUTANEOUSLY WITH THE EARLY MEAL AND 30 UNITS WITH THE LATE MEAL  . predniSONE (DELTASONE) 10 MG tablet Take  4 each am x 2 days,   2 each am x 2 days,  1 each am x 2 days and stop  . predniSONE (DELTASONE) 20 MG tablet Take 2 tablets (43m) a day until the bottle is finished  . salicylic acid 17 % gel Apply topically 2 (two) times daily. Apply small amount to callous area on bottom of foot BID  . sertraline (ZOLOFT) 100 MG tablet TAKE 1 AND 1/2 TABLETS AT BEDTIME  . spironolactone (ALDACTONE) 25 MG tablet Take 1 tablet (25 mg total) by mouth daily before supper.  . TRULICITY 06.22MWL/7.9GXSOPN INJECT 0.75 MG INTO THE SKIN ONE TIME A WEEK.  . Vitamin D, Ergocalciferol, (DRISDOL) 50000 UNITS CAPS capsule Take 1 capsule (50,000 Units total) by mouth every 30 (thirty) days. Take on 3rd of the month   No facility-administered encounter medications on file as of 06/22/2015.    Past Psychiatric History/Hospitalization(s): Anxiety: Yes Bipolar Disorder: No Depression: Yes Mania: No Psychosis: No Schizophrenia: No Personality Disorder: No Hospitalization for psychiatric illness: No History of Electroconvulsive Shock Therapy: No Prior Suicide Attempts: No  Physical Exam: Constitutional:  BP 138/81 mmHg  Pulse 78  Ht _0  (1.549 m)  Wt 203 lb 9.6 oz (92.352 kg)  BMI 38.49 kg/m2  General Appearance: alert, oriented, no acute distress  Musculoskeletal: Strength & Muscle Tone: decreased and walks with cane Gait & Station:  unsteady Patient leans: Front  Mental Status Examination/Evaluation: Objective: Attitude: Calm and cooperative  Appearance: Casual,  appears to be stated age  Eye Contact::  Good has glaucoma and difficulty seeing despite using her glasses  Speech:  Clear and Coherent and Normal Rate  Volume:  Normal  Mood:  anxious  Affect:  Full Range  Thought Process:  Goal Directed  Orientation:  Full (Time, Place, and Person)  Thought Content:  Negative  Suicidal Thoughts:  No  Homicidal Thoughts:  No  Judgement:  Good  Insight:  Good  Concentration: good  Memory: Immediate-good Recent-good Remote-good  Recall: fair  Language: fair  Gait and Station: normal  [employer] of Knowledge: average  Psychomotor Activity:  Normal  Akathisia:  No  Handed:  Right  AIMS (if indicated): n/a   Assets:  Communication Skills Desire for Improvement Housing Leisure Time Resilience Social Support       Medical Decision Making (Choose Three): Established Problem, Stable/Improving (1), Review of Psycho-Social Stressors (1), Review or order clinical lab tests (1), Review of Medication Regimen & Side Effects (2) and Review of New Medication or Change in Dosage (2)  Assessment:  #1 Maj. depression recurrent chronic moderate. Increase Wellbutrin to 281m BID by mouth every morning. For constipation recommend fiber or laxative. Pt will f/up with PCP next week regarding consitpation and nutriition She'll be continued on Zoloft 150 mg by mouth every a.m. #2 generalized anxiety disorder. Wellbutrin and Zoloft Also discussed relaxations and deep breathing techniques with her and patient has agreed to practice them at home. #3 Multiple medical problems- Will be treated by her PCP Dr. SJudson Rochneal. #4 nutrition- Discussed the importance of nutrition and psychoeducation regarding nutrition given her multiple health problems was discussed with her. Patient stated understanding and also encouraged her to walk a  little more so that she moves.  Therapy: brief supportive therapy provided. Discussed psychosocial stressors in detail.    Labs: reviewed 03/20/2015 glue 277, HbA1c on 04/12/2015 was 8.6  Pt denies SI and is at an acute low risk for suicide.Patient told to call clinic if any problems occur. Patient advised to go to ER if they should develop SI/HI, side effects, or if symptoms worsen. Has crisis numbers to call if needed. Pt verbalized understanding.  F/up in 2 months or sooner if needed   ACharlcie Cradle MD 06/22/2015

## 2015-06-22 NOTE — Telephone Encounter (Signed)
Pt has been scheduled to see MW today at 2:45pm. Nothing further was needed.

## 2015-06-23 ENCOUNTER — Other Ambulatory Visit: Payer: Self-pay | Admitting: Family Medicine

## 2015-06-23 ENCOUNTER — Telehealth: Payer: Self-pay | Admitting: Internal Medicine

## 2015-06-23 MED ORDER — ACETAMINOPHEN-CODEINE #3 300-30 MG PO TABS: 1.0000 | ORAL_TABLET | Freq: Every day | ORAL | Status: DC | PRN

## 2015-06-23 NOTE — Telephone Encounter (Signed)
Rx called into pharmacy per MW instructions. Pt notified. Nothing further needed.

## 2015-06-23 NOTE — Telephone Encounter (Signed)
Spoke with pt, requesting a refill on Tylenol #3.  Pt was seen yesterday in office, states she did not know at that time that she needed a refill. [facility] uses [facility] on [address].   Medication is on pt's med list but has no quantity on the rx. MW please advise if ok to refill and if so a quantity.  Thanks!

## 2015-06-23 NOTE — Telephone Encounter (Signed)
Ok 40 

## 2015-06-25 ENCOUNTER — Encounter: Payer: Self-pay | Admitting: Internal Medicine

## 2015-06-25 DIAGNOSIS — R053 Chronic cough: Secondary | ICD-10-CM | POA: Insufficient documentation

## 2015-06-25 DIAGNOSIS — R05 Cough: Secondary | ICD-10-CM | POA: Insufficient documentation

## 2015-06-25 DIAGNOSIS — J45909 Unspecified asthma, uncomplicated: Secondary | ICD-10-CM | POA: Insufficient documentation

## 2015-06-25 NOTE — Assessment & Plan Note (Signed)
The most common causes of chronic cough in immunocompetent adults include the following: upper airway cough syndrome (UACS), previously referred to as postnasal drip syndrome (PNDS), which is caused by variety of rhinosinus conditions; (2) asthma; (3) GERD; (4) chronic bronchitis from cigarette smoking or other inhaled environmental irritants; (5) nonasthmatic eosinophilic bronchitis; and (6) bronchiectasis.   These conditions, singly or in combination, have accounted for up to 94% of the causes of chronic cough in prospective studies.   Other conditions have constituted no >6% of the causes in prospective studies These have included bronchogenic carcinoma, chronic interstitial pneumonia, sarcoidosis, left ventricular failure, ACEI-induced cough, and aspiration from a condition associated with pharyngeal dysfunction.    Chronic cough is often simultaneously caused by more than one condition. A single cause has been found from 38 to 82% of the time, multiple causes from 18 to 62%. Multiply caused cough has been the result of three diseases up to 42% of the time.       I suspect a combination of cough variant asthma and  Classic Upper airway cough syndrome, so named because it's frequently impossible to sort out how much is  CR/sinusitis with freq throat clearing (which can be related to primary GERD)   vs  causing  secondary (" extra esophageal")  GERD from wide swings in gastric pressure that occur with throat clearing, often  promoting self use of mint and menthol lozenges that reduce the lower esophageal sphincter tone and exacerbate the problem further in a cyclical fashion.   These are the same pts (now being labeled as having "irritable larynx syndrome" by some cough centers) who not infrequently have a history of having failed to tolerate ace inhibitors,  dry powder inhalers or biphosphonates or report having atypical reflux symptoms that don't respond to standard doses of PPI , and are easily  confused as having aecopd or asthma flares by even experienced allergists/ pulmonologists.   rec max depression of cough for at least 3-5 days using Tylenol 3 and maximum treatment for reflux at the same time.  I had an extended discussion with the patient reviewing all relevant studies completed to date and  lasting 15 to 20 minutes of a 25 minute visit    Each maintenance medication was reviewed in detail including most importantly the difference between maintenance and prns and under what circumstances the prns are to be triggered using an action plan format that is not reflected in the computer generated alphabetically organized AVS.    Please see instructions for details which were reviewed in writing and the patient given a copy highlighting the part that I personally wrote and discussed at today's ov.

## 2015-06-25 NOTE — Assessment & Plan Note (Signed)
The proper method of use, as well as anticipated side effects, of a metered-dose inhaler are discussed and demonstrated to the patient. Improved effectiveness after extensive coaching during this visit to a level of approximately  75% from a baseline of 25%  The problem here is that she if she does have a component of upper cough syndrome even Qvar can aggravate it.   For now should continue this along with Singulair and follow up with Dr. Annamaria Boots as planned

## 2015-06-25 NOTE — Assessment & Plan Note (Signed)
Complicated by DM/ hbp/hyperlipidemia  Body mass index is 38.57    Lab Results  Component Value Date   TSH 2.911 02/09/2014     Contributing to gerd tendency/ doe/reviewed the need and the process to achieve and maintain neg calorie balance > defer f/u primary care including intermittently monitoring thyroid status

## 2015-06-26 NOTE — Telephone Encounter (Signed)
Patient has an appt on 06/28/2015 with pcp. Jazmin Hartsell,CMA

## 2015-06-27 ENCOUNTER — Other Ambulatory Visit: Payer: Self-pay | Admitting: *Deleted

## 2015-06-27 NOTE — Patient Outreach (Signed)
Call was made to f/u with pt, schedule home visit and person answering the phone reports pt not available.  Plan to try again.      Zara Chess.   [facility]  [phone]

## 2015-06-28 ENCOUNTER — Encounter: Payer: Self-pay | Admitting: Family Medicine

## 2015-06-28 ENCOUNTER — Ambulatory Visit (INDEPENDENT_AMBULATORY_CARE_PROVIDER_SITE_OTHER): Payer: Commercial Managed Care - HMO | Admitting: Family Medicine

## 2015-06-28 ENCOUNTER — Ambulatory Visit
Admission: RE | Admit: 2015-06-28 | Discharge: 2015-06-28 | Disposition: A | Discharge status: Home / Self Care | Payer: Commercial Managed Care - HMO | Source: Ambulatory Visit

## 2015-06-28 VITALS — BP 136/68 | HR 76 | Temp 98.1°F | Ht 61.0 in | Wt 201.6 lb

## 2015-06-28 DIAGNOSIS — E1149 Type 2 diabetes mellitus with other diabetic neurological complication: Secondary | ICD-10-CM

## 2015-06-28 DIAGNOSIS — I10 Essential (primary) hypertension: Secondary | ICD-10-CM | POA: Diagnosis not present

## 2015-06-28 DIAGNOSIS — R05 Cough: Secondary | ICD-10-CM

## 2015-06-28 DIAGNOSIS — Z1231 Encounter for screening mammogram for malignant neoplasm of breast: Secondary | ICD-10-CM | POA: Diagnosis not present

## 2015-06-28 DIAGNOSIS — M1990 Unspecified osteoarthritis, unspecified site: Secondary | ICD-10-CM

## 2015-06-28 DIAGNOSIS — R053 Chronic cough: Secondary | ICD-10-CM

## 2015-06-28 DIAGNOSIS — M1610 Unilateral primary osteoarthritis, unspecified hip: Secondary | ICD-10-CM

## 2015-06-28 LAB — BASIC METABOLIC PANEL
BUN: 20 mg/dL (ref 7–25)
CO2: 32 mmol/L — ABNORMAL HIGH (ref 20–31)
Calcium: 9.7 mg/dL (ref 8.6–10.4)
Chloride: 101 mmol/L (ref 98–110)
Creat: 0.86 mg/dL (ref 0.50–0.99)
Glucose, Bld: 123 mg/dL — ABNORMAL HIGH (ref 65–99)
Potassium: 3.7 mmol/L (ref 3.5–5.3)
Sodium: 143 mmol/L (ref 135–146)

## 2015-06-28 NOTE — Assessment & Plan Note (Signed)
She is recently been seen by pulmonology and has a follow-up with them.  I think her cough is multifactorial.  We discussed.

## 2015-06-28 NOTE — Assessment & Plan Note (Signed)
Hypertension well controlled.  We'll check her creatinine today.  No medication changes.

## 2015-06-28 NOTE — Assessment & Plan Note (Signed)
Bilateral right greater than left groin pain worse with any type of squatting or bending over.  I reviewed her CT scan and previous x-rays which show some changes consistent with avascular necrosis, sclerosis of the femoral head.  Think mostly she is concerned about the pain as a  Indicator of some pathologic process. I do think this is related to her arthritis.  This point she is not surgical candidate.  I reassured her.

## 2015-06-28 NOTE — Progress Notes (Signed)
   Subjective:    Patient ID: [name] [name], female    DOB: [birth year], 64 y.o.   MRN: [id number]  HPI    Review of Systems     Objective:   Physical Exam  Final signs reviewed in GENERAL: Well-developed white female in acute distress using a walker as assistive device .HIPS: Internal and external rotation bilaterally are mildly painful but she still has full range of motion.  Flexor strength is normal.  She can rise from a chair with only a little bit of assistance from her walker.  Her gait is slightly antalgic  Stooped posture. CV: Regular rate and rhythm no murmur EXTREMITY: No edema. LUNGS: Clear to auscultation bilaterally without rales or wheeze.      Assessment & Plan:

## 2015-06-29 ENCOUNTER — Ambulatory Visit (INDEPENDENT_AMBULATORY_CARE_PROVIDER_SITE_OTHER): Payer: Commercial Managed Care - HMO

## 2015-06-29 DIAGNOSIS — J309 Allergic rhinitis, unspecified: Secondary | ICD-10-CM | POA: Diagnosis not present

## 2015-06-30 ENCOUNTER — Other Ambulatory Visit: Payer: Self-pay | Admitting: *Deleted

## 2015-07-03 NOTE — Patient Outreach (Signed)
[place] [facility]) Care Management   Home visit 06/30/15  [name] [name] 02-07-1951 [id number]  [name] [name] is an 64 y.o. female  Subjective: Pt reports dealing with congestion, cough - started 10/8.  Pt states she went to Lung MD and Primary Care MD, second time taking Prednisone and feeling better.  Pt states she is to f/u with Dr. Ronnald Ramp (Lung MD) 11/9.   Pt reports she has an extra bottle of Prednisone, took 2 pills this am- felt it would help.  After discussion with pt, found MD did not order her to take additional Prednisone.  Pt states blood sugar this am was 140, 223 last night.    Objective:   ROS  Physical Exam  Constitutional: She is oriented to person, place, and time. She appears well-developed and well-nourished.  Cardiovascular: Normal rate and regular rhythm.   Respiratory: Breath sounds normal.  GI: Soft.  Musculoskeletal: Normal range of motion. She exhibits edema.  Trace edema in left lower leg   Neurological: She is alert and oriented to person, place, and time.  Skin: Skin is warm and dry.  Psychiatric: She has a normal mood and affect. Her behavior is normal. Judgment and thought content normal.    Current Medications:  Reviewed with pt  Current Outpatient Prescriptions  Medication Sig Dispense Refill  . acetaminophen (TYLENOL) 325 MG tablet Take 650 mg by mouth every 6 (six) hours as needed (pain).     [name] acetaminophen-codeine (TYLENOL #3) 300-30 MG tablet Take 1 tablet by mouth daily as needed (pain). 40 tablet 0  . acyclovir (ZOVIRAX) 200 MG capsule Take 200 mg by mouth 2 (two) times daily.    [name] albuterol (PROVENTIL HFA;VENTOLIN HFA) 108 (90 BASE) MCG/ACT inhaler Inhale 2 puffs into the lungs every 6 (six) hours as needed for wheezing or shortness of breath. 3 Inhaler 3  . albuterol (PROVENTIL) (2.5 MG/3ML) 0.083% nebulizer solution Take 2.5 mg by nebulization every 6 (six) hours as needed for wheezing or shortness of breath.     . Alcohol Swabs (B-D SINGLE USE SWABS REGULAR) PADS USE THREE TIMES DAILY 300 each 3  . allopurinol (ZYLOPRIM) 300 MG tablet TAKE 1 TABLET EVERY DAY (Patient taking differently: TAKE 1 TABLET BY MOUTH EVERY DAY) 90 tablet 3  . ammonium lactate (LAC-HYDRIN) 12 % lotion Apply 1 application topically 2 (two) times daily as needed for dry skin.    [name] aspirin EC 81 MG tablet Take 81 mg by mouth daily.    [name] atorvastatin (LIPITOR) 80 MG tablet Take 1 tablet (80 mg total) by mouth daily. 90 tablet 3  . azelastine (ASTELIN) 0.1 % nasal spray Place 2 sprays into both nostrils daily. Use in each nostril as directed    . B-D ULTRAFINE III SHORT PEN 31G X 8 MM MISC USE THREE TIMES DAILY 270 each 3  . beclomethasone (QVAR) 80 MCG/ACT inhaler Inhale 2 puffs into the lungs daily.    . bisacodyl (DULCOLAX) 5 MG EC tablet Take 5 mg by mouth daily as needed. Pt takes 1-2 tablets once a week    . Blood Glucose Monitoring Suppl (ACCU-CHEK AVIVA PLUS) W/DEVICE KIT Use to check blood sugar 3-4 times daily 1 kit 0  . brimonidine (ALPHAGAN P) 0.1 % SOLN Place 1 drop into the left eye 2 (two) times daily.     [name] buPROPion (WELLBUTRIN) 100 MG tablet Take 1 tablet (100 mg total) by mouth 2 (two) times daily. 180 tablet 0  .  esomeprazole (NEXIUM) 40 MG capsule Take 1 capsule (40 mg total) by mouth daily. 90 capsule 3  . ferrous sulfate 325 (65 FE) MG tablet Take 325 mg by mouth daily.    . furosemide (LASIX) 40 MG tablet Take 40 mg by mouth 2 (two) times daily.    [name] glucose blood test strip Test blood sugar 3-4 times daily 400 each 12  . hydrocortisone (ANUSOL-HC) 2.5 % rectal cream Place 1 application rectally 2 (two) times daily. 30 g 2  . Insulin Glargine (LANTUS SOLOSTAR) 100 UNIT/ML Solostar Pen INJECT  65 UNITS SUBCUTANEOUSLY EVERY DAY 65 mL 3  . Lancets MISC Use twice or three times daily to check blood sugar 200 each 12  . latanoprost (XALATAN) 0.005 % ophthalmic solution Place 1 drop into both eyes at bedtime.    [name]  losartan (COZAAR) 100 MG tablet TAKE 1 TABLET EVERY DAY (Patient taking differently: TAKE 1 TABLET BY MOUTH  EVERY DAY) 90 tablet 3  . metoprolol succinate (TOPROL-XL) 50 MG 24 hr tablet Take 3 tablets (150 mg total) by mouth daily. Take with or immediately following a meal. 270 tablet 3  . montelukast (SINGULAIR) 10 MG tablet TAKE 1 TABLET EVERY DAY (Patient taking differently: TAKE 1 TABLET BY MOUTH EVERY DAY) 90 tablet 3  . Multiple Vitamins-Minerals (MULTIVITAMIN PO) Take 1 tablet by mouth daily.    . nitroGLYCERIN (NITROSTAT) 0.4 MG SL tablet Place 0.4 mg under the tongue every 5 (five) minutes as needed for chest pain.    [name] NOVOLOG FLEXPEN 100 UNIT/ML FlexPen INJECT 30 UNITS SUBCUTANEOUSLY WITH THE EARLY MEAL AND 30 UNITS WITH THE LATE MEAL 60 mL 5  . predniSONE (DELTASONE) 10 MG tablet Take  4 each am x 2 days,   2 each am x 2 days,  1 each am x 2 days and stop 14 tablet 0  . salicylic acid 17 % gel Apply topically 2 (two) times daily. Apply small amount to callous area on bottom of foot BID 14 g 3  . sertraline (ZOLOFT) 100 MG tablet TAKE 1 AND 1/2 TABLETS AT BEDTIME 135 tablet 0  . spironolactone (ALDACTONE) 25 MG tablet Take 1 tablet (25 mg total) by mouth daily before supper. 90 tablet 3  . TRULICITY 5.45 GY/5.6LS SOPN INJECT  0.75MG (0.5ML) SUBCUTANEOUSLY ONE TIME WEEKLY 12 pen 12  . Vitamin D, Ergocalciferol, (DRISDOL) 50000 UNITS CAPS capsule Take 1 capsule (50,000 Units total) by mouth every 30 (thirty) days. Take on 3rd of the month    . EPINEPHrine 0.3 mg/0.3 mL IJ SOAJ injection Inject 0.3 mLs (0.3 mg total) into the muscle once as needed (for allergic reaction). (Patient not taking: Reported on 06/30/2015) 1 Device 3  . NONFORMULARY OR COMPOUNDED ITEM Allergy Vaccine 1:10 Given at [facility] Pulmonary    . predniSONE (DELTASONE) 20 MG tablet Take 2 tablets (34m) a day until the bottle is finished (Patient not taking: Reported on 06/30/2015) 10 tablet 0   No current  facility-administered medications for this visit.     Fall/Depression Screening:    PHQ 2/9 Scores 06/28/2015 06/21/2015 06/19/2015 05/17/2015 04/19/2015 04/12/2015 03/29/2015  PHQ - 2 Score 0 0 0 0 0 0 0    Assessment:   Hx of COPD- reported chronic cough, congestion.  Lungs clear.  Pt needs to stay hydrated, nebulizer treatment and rescue inhaler as needed.  Pt to f/u with Dr. JRonnald Ramp11/9.  Medication issue- pt taking Prednisone not ordered by MD.   As discussed with pt, not to take any medication unless ordered by MD.                            DM-  Fluctuation in blood sugars, ranges 83- 338, higher in pm.  Averages 7 day- 128, 14 day 156, 28 day- 178.                                     Plan:   Pt to f/u with Dr. Ronnald Ramp (Lung MD) 11/9.             Pt to stay hydrated, use nebulizer/rescue inhaler as needed recovering from chronic cough, congestion.              Pt to continue to check sugars often,record, bring to next Primary Care MD office visit.              RN CM to continue to provide community nurse case management services, next h/v 11/30.    [facility] CM Care Plan Problem Two        Most Recent Value   Care Plan Problem Two  COPD- pt dealing with congestion - started 10/8.    Role Documenting the Problem Two  Care Management Talent for Problem Two  Active   Interventions for Problem Two Long Term Goal   Discussed with pt f/u with Lung MD as scheduled, importance of hydration,   [facility]N Long Term Goal (31-90) days  pt's congestion would be resolved in the next 31 days    THN Long Term Goal Start Date  06/30/15   [facility] CM Short Term Goal #1 (0-30 days)  Medication issues- pt will be compliant with taking medication (Prednisone ) as ordered until f/u with Lung MD 11/9   [facility] CM Short Term Goal #1 Start Date  06/30/15   Interventions for Short Term Goal #2   Discussed with pt to take Prednisone only as ordered by MD - pt reports received extra rx of  Prednsione from pharmacy.      [facility] CM Care Plan Problem Three        Most Recent Value   THN CM Short Term Goal #4 (0-30 days)  Pt's A1C would be down one point in the next 30 days    [facility] CM Short Term Goal #4 Start Date  06/30/15   Interventions for Short Term Goal #4  Discussed with pt to continue to check sugars 1-2 times a day, diet compliance       Rose M.   [facility] Care Management  [phone]

## 2015-07-05 ENCOUNTER — Ambulatory Visit: Payer: Self-pay | Admitting: *Deleted

## 2015-07-06 ENCOUNTER — Encounter: Payer: Self-pay | Admitting: Family Medicine

## 2015-07-07 ENCOUNTER — Other Ambulatory Visit: Payer: Self-pay | Admitting: *Deleted

## 2015-07-07 ENCOUNTER — Telehealth: Payer: Self-pay | Admitting: Family Medicine

## 2015-07-07 ENCOUNTER — Other Ambulatory Visit: Payer: Self-pay | Admitting: Family Medicine

## 2015-07-07 MED ORDER — TRAMADOL HCL 50 MG PO TABS: 100.0000 mg | ORAL_TABLET | Freq: Two times a day (BID) | ORAL | Status: DC | PRN

## 2015-07-07 NOTE — Progress Notes (Unsigned)
[place] Please phone in rx for tramadol THANKS! [name]

## 2015-07-07 NOTE — Telephone Encounter (Signed)
Patient requesting pain meds for the "pain at the bottom of her stomach". Please advise.

## 2015-07-07 NOTE — Telephone Encounter (Signed)
[facility] faxed some tramadol rx in [name] [name]

## 2015-07-12 ENCOUNTER — Encounter: Payer: Self-pay | Admitting: Internal Medicine

## 2015-07-12 ENCOUNTER — Ambulatory Visit (INDEPENDENT_AMBULATORY_CARE_PROVIDER_SITE_OTHER)
Admission: RE | Admit: 2015-07-12 | Discharge: 2015-07-12 | Disposition: A | Discharge status: Home / Self Care | Payer: Commercial Managed Care - HMO | Source: Ambulatory Visit | Attending: Internal Medicine | Admitting: Internal Medicine

## 2015-07-12 ENCOUNTER — Ambulatory Visit (INDEPENDENT_AMBULATORY_CARE_PROVIDER_SITE_OTHER): Payer: Commercial Managed Care - HMO | Admitting: Internal Medicine

## 2015-07-12 ENCOUNTER — Ambulatory Visit (INDEPENDENT_AMBULATORY_CARE_PROVIDER_SITE_OTHER): Payer: Commercial Managed Care - HMO

## 2015-07-12 VITALS — BP 116/72 | HR 74 | Ht 61.0 in | Wt 206.0 lb

## 2015-07-12 DIAGNOSIS — R05 Cough: Secondary | ICD-10-CM | POA: Diagnosis not present

## 2015-07-12 DIAGNOSIS — J449 Chronic obstructive pulmonary disease, unspecified: Secondary | ICD-10-CM | POA: Diagnosis not present

## 2015-07-12 DIAGNOSIS — J45909 Unspecified asthma, uncomplicated: Secondary | ICD-10-CM | POA: Diagnosis not present

## 2015-07-12 DIAGNOSIS — J309 Allergic rhinitis, unspecified: Secondary | ICD-10-CM

## 2015-07-12 DIAGNOSIS — G4733 Obstructive sleep apnea (adult) (pediatric): Secondary | ICD-10-CM

## 2015-07-12 MED ORDER — METHYLPREDNISOLONE ACETATE 80 MG/ML IJ SUSP
80.0000 mg | Freq: Once | INTRAMUSCULAR | Status: AC
  Administered 2015-07-12: 80 mg via INTRAMUSCULAR

## 2015-07-12 MED ORDER — LEVALBUTEROL HCL 0.63 MG/3ML IN NEBU
0.6300 mg | INHALATION_SOLUTION | Freq: Once | RESPIRATORY_TRACT | Status: AC
  Administered 2015-07-12: 0.63 mg via RESPIRATORY_TRACT

## 2015-07-12 NOTE — Patient Instructions (Signed)
Neb xop 0.63  Depo 80  Order- CXR   Dx asthma/ COPD  Consider using Mucinex-DM   We can continue allergy shots  We can continue CPAP 12/ Huey Romans

## 2015-07-12 NOTE — Progress Notes (Signed)
Patient ID: [name] [name], female    DOB: [birth year], 64 y.o.   MRN: [id number]   Brief patient profile:   36 yobf never regular smoker quit in the 1977   followed for asthma, sleep apnea, complicated by hx depression, vocal cord paralysis, GERD.  History of Present Illness  Ov  01/24/11  Seen  August 17, 2010- note reviewed  Doesn't wear CPAP- can't tolerate it on her face. Didn't like nasal pillows or her current nasal mask. Has own teeth. Aware she tosses and turns at night. CPAP 12 Advanced.  Denies recent asthma attacks. Uses rescue inhaler 1-2x/ month. Continues allergy shots and says she breathes better and coughs less since she has been on them.  No longer chokes or coughs much with eating as she used to. No recent colds.   07/31/11-   followed for asthma, sleep apnea, complicated by hx depression, vocal cord paralysis, GERD. Has had flu vaccine. Continues CPAP at 12 CWP,  used all night every night. She had moved, and can't find her nebulizer machine. We discussed whether she would use it this winter. She continues allergy vaccine and believes that has helped.  03/01/13- followed for asthma/ COPD, sleep apnea, complicated by hx depression, vocal cord paralysis, GERD. FOLLOWS FOR: has good and bad days since no longer on vaccine; Wears CPAP 12/ Advanced every night for about 8-9 hours; pressure working well for patient. Neck pain s/p c-spine fusion, ,imits comfortable sleep position with CPAP. Occ wheeze and persistent nose and chest congestion despite meds. Would like to restart allergy vaccine- discussed. Thinks she is mobile enough with [facility] transport to get here.  PFT 2011 documented moderate obstruction w/ little response to bronchodilator.   07/01/13-   followed for asthma/ COPD, sleep apnea, complicated by hx depression, vocal cord paralysis, GERD. FOLLOWS FOR: wears CPAP 12/ Advanced every night for abour 8 or more hours; pressure doing well for patient. Still on allergy  vaccine; noticed she had some bumps on arms after her injections(varies). Has noticed that about 5-10 minutes after Brovana treatment she has jitters and nervous feelings. Holding vaccine dose at 1:5000 [place]. She notices minor bumps but may be unrelated.  01/03/14-  r followed for asthma/ COPD, sleep apnea, complicated by hx depression, vocal cord paralysis, GERD. FOLLOWS FOR: continues to wear CPAP12/[employer] every night for about 5-9 hours; pressure is doing well; DME is [employer]. Pt is still on vaccine and doing well. CPAP doing well.  Allergy vaccine 1:50 GH, holding. Jaw pain w/ sustained walking in January. Sees Dr P.Ross/ Cards. Cath neg.  07/06/14-  followed for asthma/ COPD, sleep apnea, complicated by hx depression, vocal cord paralysis, GERD FOLLOWS FOR: Pt taking allergy vaccine and is going well. Pt c/o dizziness that presented today. Pt had unsteady gate when walking to exam room and needed wheelchair. Pt states she is wearing her CPAP12/[facility] nightly for 5-8 hours. Pt denies issues with mask, pressure or machine.  Allergy vaccine 1:10 GH, holding. Has been feeling dizzy/unsteady. This is not new and has been intermittent over at least the last couple of years. She thinks she is doing well with allergy vaccine and seeks no changes Describes good compliance and control with CPAP CXR 05/25/14 IMPRESSION: No radiographic evidence of acute cardiopulmonary disease. Signed, Dulcy Fanny. [name], DO [facility] Electronically Signed  By: Corrie Mckusick O.D.  On: 05/25/2014 21:47  01/03/15-  Dr Annamaria Boots  followed for asthma/ COPD, sleep apnea, complicated by  hx depression, vocal cord paralysis, GERD FOLLOWS FOR: Pt still on vaccine and doing well; no issues with vaccine. Pt states she wears CPAP 12/ [employer]  every night for about 4-9 hours.  Allergy vaccine 1:10 [place] She feels she is doing well both both therapies.  rec We can continue  allergy vaccine 1:10 GH  We can continue CPAP 12/ [employer] Script to try Cheracol cough syrup  06/22/2015  f/u ov/Wert re: acute on chronic cough ? Etiology  Chief Complaint  Patient presents with  . Acute Visit    Pt c/o chest congestion, chest tightness and cough for the past wk. She has also noticed more wheezing. She is using albuterol inhaler 2 x daily on average.    Even when fine needs dose of narcotic cough med at hs despite maint rx with   singulair and qvar 80 2 pffs in am then prn at hs  Acutely worse x one week assoc with sense of chest congestion and tightness but no excess or purulent mucus production  07/12/15- . 64 year old female never regular smoker followed for asthma/ COPD, OSA, complicated by hx depression, vocal cord paralysis, GERD, obesity, DM 2, glaucoma Allergy vaccine 1:10 GH  CPAP 12/ [employer] FOLLOWS FOR: Pt seen last for by [facility] 06-22-15 for acute-cough; Pt states she has not been able to get rid of her cough. Continues allergy vaccine and no reactions.  PFT 07/19/2010-moderate obstructive airways disease, insignificant response to bronchodilator, FEV1/FVC 0.65, TLC 63%, DLCO 71% Had recent acute visit was directed to prednisone, Delsym (can't afford) Qvar and Nexium These were little help for persistent dry cough. She isn't sure if this began with a cold-nothing specific. Wheeze has resolved. Has not had fever or sore throat. Sleeps with elevated head of bed but still notices some reflux occasionally, mostly daytime. Continues CPAP was directed.  ROS-see HPI   Negative unless "+" Constitutional:    weight loss, night sweats, fevers, chills, fatigue, lassitude. HEENT:    headaches, difficulty swallowing, tooth/dental problems, sore throat,       sneezing, itching, ear ache, nasal congestion, post nasal drip, snoring CV:    chest pain, orthopnea, PND, swelling in lower extremities, anasarca,                                                           dizziness,  palpitations Resp:   shortness of breath with exertion or at rest.                productive cough,   non-productive cough, coughing up of blood.              change in color of mucus.  wheezing.   Skin:    rash or lesions. GI:  No-   heartburn, indigestion, abdominal pain, nausea, vomiting, diarrhea,                 change in bowel habits, loss of appetite GU: dysuria, change in color of urine, no urgency or frequency.   flank pain. MS:   joint pain, stiffness, decreased range of motion, back pain. Neuro-     nothing unusual Psych:  change in mood or affect.  depression or anxiety.   memory loss.  OBJ- Physical Exam General- Alert, Oriented, Affect-appropriate, Distress- none acute Skin- rash-none, lesions-  none, excoriation- none Lymphadenopathy- none Head- atraumatic            Eyes- impaired/thick glasses, PERRLA, conjunctivae and secretions clear            Ears- Hearing, canals-normal            Nose- Clear, no-Septal dev, mucus, polyps, erosion, perforation             Throat- Mallampati II , mucosa clear , drainage- none, tonsils- atrophic Neck- flexible , trachea midline, no stridor , thyroid nl, carotid no bruit Chest - symmetrical excursion , unlabored           Heart/CV- RRR , no murmur , no gallop  , no rub, nl s1 s2                           - JVD- none , edema- none, stasis changes- none, varices- none           Lung- clear to P&A, wheeze- none, cough- none , dullness-none, rub- none           Chest wall-  Abd-  Br/ Gen/ Rectal- Not done, not indicated Extrem- cyanosis- none, clubbing, none, atrophy- none, strength- nl Neuro- grossly intact to observation

## 2015-07-12 NOTE — Assessment & Plan Note (Signed)
Recent exacerbation was probably viral. Cough is persisted. Plan-chest x-ray, nebulizer treatment with Xopenex, Depo-Medrol with diabetes talk

## 2015-07-12 NOTE — Assessment & Plan Note (Signed)
She reports continued compliance with CPAP and generally sleeps well

## 2015-07-17 ENCOUNTER — Telehealth: Payer: Self-pay | Admitting: Internal Medicine

## 2015-07-17 NOTE — Telephone Encounter (Signed)
Called and spoke to pt. Informed her of the results and recs per CY. Pt verbalized understanding and denied any further questions or concerns at this time.    Notes Recorded by Deneise Lever, MD on 07/12/2015 at 7:33 PM CXR- looks stable and clewar. No active process seen

## 2015-07-17 NOTE — Progress Notes (Signed)
Quick Note:  Called and spoke to pt. Informed her of the results and recs per CY. Pt verbalized understanding and denied any further questions or concerns at this time.   ______ 

## 2015-07-18 ENCOUNTER — Telehealth: Payer: Self-pay | Admitting: Family Medicine

## 2015-07-18 DIAGNOSIS — M1610 Unilateral primary osteoarthritis, unspecified hip: Secondary | ICD-10-CM

## 2015-07-18 DIAGNOSIS — M4322 Fusion of spine, cervical region: Secondary | ICD-10-CM

## 2015-07-18 DIAGNOSIS — M542 Cervicalgia: Secondary | ICD-10-CM

## 2015-07-18 NOTE — Telephone Encounter (Signed)
Still having pain in lower stomach and in the neck " around that bone". Would like to have a xray ordered to find out whats going on.  Tramadol does help some

## 2015-07-19 ENCOUNTER — Ambulatory Visit: Payer: Self-pay | Admitting: *Deleted

## 2015-07-19 ENCOUNTER — Ambulatory Visit (INDEPENDENT_AMBULATORY_CARE_PROVIDER_SITE_OTHER): Payer: Commercial Managed Care - HMO

## 2015-07-19 ENCOUNTER — Telehealth: Payer: Self-pay | Admitting: Internal Medicine

## 2015-07-19 DIAGNOSIS — J309 Allergic rhinitis, unspecified: Secondary | ICD-10-CM

## 2015-07-19 NOTE — Telephone Encounter (Signed)
Allergy Serum Extract Date Mixed: 07/19/2015 Vial: AB Strength: 1:10 Here/Mail/Pick Up: Here Mixed By: Desmond Dike, CMA Last OV: 07/12/2015 Pending OV: 01/10/2016

## 2015-07-20 ENCOUNTER — Encounter: Payer: Self-pay | Admitting: *Deleted

## 2015-07-20 ENCOUNTER — Other Ambulatory Visit: Payer: Self-pay | Admitting: *Deleted

## 2015-07-20 ENCOUNTER — Ambulatory Visit (INDEPENDENT_AMBULATORY_CARE_PROVIDER_SITE_OTHER): Payer: Commercial Managed Care - HMO

## 2015-07-20 DIAGNOSIS — J309 Allergic rhinitis, unspecified: Secondary | ICD-10-CM

## 2015-07-20 NOTE — Telephone Encounter (Signed)
OK I will put in order for neck films--and hip films--- she can go to [facility] radiology at her leisure.  Dear [name] Team Please let her know orders are in [facility]! [name] [name]

## 2015-07-20 NOTE — Telephone Encounter (Signed)
Pt informed of below. Zimmerman Rumple, April D, CMA  
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
## 2015-07-21 ENCOUNTER — Ambulatory Visit (HOSPITAL_COMMUNITY)
Admission: RE | Admit: 2015-07-21 | Discharge: 2015-07-21 | Disposition: A | Discharge status: Home / Self Care | Payer: Commercial Managed Care - HMO | Source: Ambulatory Visit | Attending: Family Medicine | Admitting: Family Medicine

## 2015-07-21 ENCOUNTER — Other Ambulatory Visit: Payer: Self-pay | Admitting: Family Medicine

## 2015-07-21 ENCOUNTER — Ambulatory Visit (HOSPITAL_COMMUNITY): Admission: RE | Admit: 2015-07-21 | Payer: Commercial Managed Care - HMO | Source: Ambulatory Visit

## 2015-07-21 DIAGNOSIS — M4322 Fusion of spine, cervical region: Secondary | ICD-10-CM | POA: Diagnosis not present

## 2015-07-21 DIAGNOSIS — M1990 Unspecified osteoarthritis, unspecified site: Secondary | ICD-10-CM | POA: Diagnosis not present

## 2015-07-21 DIAGNOSIS — M1610 Unilateral primary osteoarthritis, unspecified hip: Secondary | ICD-10-CM

## 2015-07-21 DIAGNOSIS — M25552 Pain in left hip: Secondary | ICD-10-CM | POA: Diagnosis not present

## 2015-07-21 DIAGNOSIS — M160 Bilateral primary osteoarthritis of hip: Secondary | ICD-10-CM | POA: Diagnosis not present

## 2015-07-21 DIAGNOSIS — M25551 Pain in right hip: Secondary | ICD-10-CM | POA: Diagnosis not present

## 2015-07-21 DIAGNOSIS — M542 Cervicalgia: Secondary | ICD-10-CM | POA: Insufficient documentation

## 2015-07-21 DIAGNOSIS — R52 Pain, unspecified: Secondary | ICD-10-CM

## 2015-07-24 ENCOUNTER — Encounter: Payer: Self-pay | Admitting: Family Medicine

## 2015-07-26 ENCOUNTER — Telehealth: Payer: Self-pay | Admitting: Family Medicine

## 2015-07-26 DIAGNOSIS — M25552 Pain in left hip: Secondary | ICD-10-CM

## 2015-07-26 MED ORDER — METOPROLOL SUCCINATE ER 50 MG PO TB24: 150.0000 mg | ORAL_TABLET | Freq: Every day | ORAL | Status: DC

## 2015-07-26 MED ORDER — BECLOMETHASONE DIPROPIONATE 80 MCG/ACT IN AERS
2.0000 | INHALATION_SPRAY | Freq: Every day | RESPIRATORY_TRACT | Status: DC
Start: 2015-07-26 — End: 2015-08-29

## 2015-07-26 NOTE — Telephone Encounter (Signed)
Refills completed.  Please call patient regarding X-ray results.  Derl Barrow, RN

## 2015-07-26 NOTE — Telephone Encounter (Signed)
Dear [name] [name] Team I have put in for MRI of hip Can u schedule--call her at this number [phone] I have not signed order yet but I will sign Monday [facility]! [name]

## 2015-07-26 NOTE — Telephone Encounter (Signed)
Pt needs refill on Qvar and Metoprolol sent to [facility]; Pt would also like to know xray results. Patient is asking for all of this to be done today. Thank you, Fonda Kinder, ASA

## 2015-07-28 ENCOUNTER — Ambulatory Visit: Payer: Self-pay

## 2015-08-01 ENCOUNTER — Telehealth: Payer: Self-pay | Admitting: Family Medicine

## 2015-08-01 NOTE — Telephone Encounter (Signed)
Checking to see if you have signed this?  We tried to set it up and they couldn't see it in the system. Please advise and we will get it set up. Katharina Caper, April D, [place]

## 2015-08-01 NOTE — Telephone Encounter (Signed)
Pt called to see if we have scheduled her MRI yet? jw

## 2015-08-02 ENCOUNTER — Other Ambulatory Visit: Payer: Self-pay | Admitting: *Deleted

## 2015-08-02 ENCOUNTER — Encounter: Payer: Self-pay | Admitting: *Deleted

## 2015-08-02 ENCOUNTER — Telehealth: Payer: Self-pay | Admitting: Family Medicine

## 2015-08-02 NOTE — Telephone Encounter (Signed)
Please see phone note from 07/26/15.  [place] team is working on getting this order scheduled.  Jazmin Hartsell,CMA

## 2015-08-02 NOTE — Patient Outreach (Signed)
[facility]) Care Management   08/02/2015  [name] [name] March 05, 1951 [id number]  [name] [name] is an 65 y.o. female  Subjective:  Pt reports on chronic groin pain, left side being the worse.  Pt states MD to have MRI of left hip scheduled.  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   Pt states her cough is better.   Pt reports sugars are better, Trulicity helps. Pt states her BP today was 127/54.    Objective:   Filed Vitals:   08/02/15 1020  BP: 114/80  Pulse: 78  Resp: 20    ROS  Physical Exam  Constitutional: She is oriented to person, place, and time. She appears well-developed and well-nourished.  Cardiovascular: Normal rate and regular rhythm.   Respiratory: Breath sounds normal.  Musculoskeletal: Normal range of motion.  Neurological: She is alert and oriented to person, place, and time.  Skin: Skin is warm and dry.  Psychiatric: She has a normal mood and affect. Her behavior is normal. Judgment and thought content normal.    Current Medications:  Reviewed with pt  Current Outpatient Prescriptions  Medication Sig Dispense Refill  . acetaminophen (TYLENOL) 325 MG tablet Take 650 mg by mouth every 6 (six) hours as needed (pain).     [name] acetaminophen-codeine (TYLENOL #3) 300-30 MG tablet Take 1 tablet by mouth daily as needed (pain). 40 tablet 0  . acyclovir (ZOVIRAX) 200 MG capsule Take 200 mg by mouth 2 (two) times daily.    [name] albuterol (PROVENTIL HFA;VENTOLIN HFA) 108 (90 BASE) MCG/ACT inhaler Inhale 2 puffs into the lungs every 6 (six) hours as needed for wheezing or shortness of breath. 3 Inhaler 3  . albuterol (PROVENTIL) (2.5 MG/3ML) 0.083% nebulizer solution Take 2.5 mg by nebulization every 6 (six) hours as needed for wheezing or shortness of breath.    . Alcohol Swabs (B-D SINGLE USE SWABS REGULAR) PADS USE THREE TIMES DAILY 300 each 3  . allopurinol (ZYLOPRIM) 300 MG tablet TAKE 1 TABLET EVERY DAY (Patient  taking differently: TAKE 1 TABLET BY MOUTH EVERY DAY) 90 tablet 3  . ammonium lactate (LAC-HYDRIN) 12 % lotion Apply 1 application topically 2 (two) times daily as needed for dry skin.    [name] aspirin EC 81 MG tablet Take 81 mg by mouth daily.    [name] atorvastatin (LIPITOR) 80 MG tablet Take 1 tablet (80 mg total) by mouth daily. 90 tablet 3  . azelastine (ASTELIN) 0.1 % nasal spray Place 2 sprays into both nostrils daily. Use in each nostril as directed    . B-D ULTRAFINE III SHORT PEN 31G X 8 MM MISC USE THREE TIMES DAILY 270 each 3  . beclomethasone (QVAR) 80 MCG/ACT inhaler Inhale 2 puffs into the lungs daily. 1 Inhaler 3  . Blood Glucose Monitoring Suppl (ACCU-CHEK AVIVA PLUS) W/DEVICE KIT Use to check blood sugar 3-4 times daily 1 kit 0  . brimonidine (ALPHAGAN P) 0.1 % SOLN Place 1 drop into the left eye 2 (two) times daily.     [name] buPROPion (WELLBUTRIN) 100 MG tablet Take 1 tablet (100 mg total) by mouth 2 (two) times daily. 180 tablet 0  . esomeprazole (NEXIUM) 40 MG capsule Take 1 capsule (40 mg total) by mouth daily. 90 capsule 3  . ferrous sulfate 325 (65 FE) MG tablet Take 325 mg by mouth daily.    . furosemide (LASIX) 40 MG tablet Take 40 mg by mouth 2 (two) times daily.    [name]  glucose blood test strip Test blood sugar 3-4 times daily 400 each 12  . hydrocortisone (ANUSOL-HC) 2.5 % rectal cream Place 1 application rectally 2 (two) times daily. 30 g 2  . Insulin Glargine (LANTUS SOLOSTAR) 100 UNIT/ML Solostar Pen INJECT  65 UNITS SUBCUTANEOUSLY EVERY DAY 65 mL 3  . Lancets MISC Use twice or three times daily to check blood sugar 200 each 12  . latanoprost (XALATAN) 0.005 % ophthalmic solution Place 1 drop into both eyes at bedtime.    [name] losartan (COZAAR) 100 MG tablet TAKE 1 TABLET EVERY DAY (Patient taking differently: TAKE 1 TABLET BY MOUTH  EVERY DAY) 90 tablet 3  . montelukast (SINGULAIR) 10 MG tablet TAKE 1 TABLET EVERY DAY (Patient taking differently: TAKE 1 TABLET BY MOUTH EVERY DAY) 90  tablet 3  . Multiple Vitamins-Minerals (MULTIVITAMIN PO) Take 1 tablet by mouth daily.    . nitroGLYCERIN (NITROSTAT) 0.4 MG SL tablet Place 0.4 mg under the tongue every 5 (five) minutes as needed for chest pain.    [name] NOVOLOG FLEXPEN 100 UNIT/ML FlexPen INJECT 30 UNITS SUBCUTANEOUSLY WITH THE EARLY MEAL AND 30 UNITS WITH THE LATE MEAL 60 mL 5  . sertraline (ZOLOFT) 100 MG tablet TAKE 1 AND 1/2 TABLETS AT BEDTIME 135 tablet 0  . spironolactone (ALDACTONE) 25 MG tablet Take 1 tablet (25 mg total) by mouth daily before supper. 90 tablet 3  . traMADol (ULTRAM) 50 MG tablet Take 2 tablets (100 mg total) by mouth every 12 (twelve) hours as needed. 60 tablet 2  . TRULICITY 2.95 AO/1.3YQ SOPN INJECT  0.75MG (0.5ML) SUBCUTANEOUSLY ONE TIME WEEKLY 12 pen 12  . Vitamin D, Ergocalciferol, (DRISDOL) 50000 UNITS CAPS capsule Take 1 capsule (50,000 Units total) by mouth every 30 (thirty) days. Take on 3rd of the month    . bisacodyl (DULCOLAX) 5 MG EC tablet Take 5 mg by mouth daily as needed. Pt takes 1-2 tablets once a week    . EPINEPHrine 0.3 mg/0.3 mL IJ SOAJ injection Inject 0.3 mLs (0.3 mg total) into the muscle once as needed (for allergic reaction). (Patient not taking: Reported on 08/02/2015) 1 Device 3  . metoprolol succinate (TOPROL-XL) 50 MG 24 hr tablet Take 3 tablets (150 mg total) by mouth daily. Take with or immediately following a meal. 270 tablet 3  . NONFORMULARY OR COMPOUNDED ITEM Allergy Vaccine 1:10 Given at [facility]    . predniSONE (DELTASONE) 20 MG tablet Take 2 tablets (87m) a day until the bottle is finished (Patient not taking: Reported on 08/02/2015) 10 tablet 0  . salicylic acid 17 % gel Apply topically 2 (two) times daily. Apply small amount to callous area on bottom of foot BID (Patient not taking: Reported on 08/02/2015) 14 g 3   No current facility-administered medications for this visit.    Functional Status:   In your present state of health, do you have any  difficulty performing the following activities: 07/20/2015 06/21/2015  Hearing? N N  Vision? Y Y  Difficulty concentrating or making decisions? N N  Walking or climbing stairs? Y Y  Dressing or bathing? N N  Doing errands, shopping? N N  Preparing Food and eating ? N N  Using the Toilet? N N  In the past six months, have you accidently leaked urine? Y N  Do you have problems with loss of bowel control? Y Y  Managing your Medications? N N  Managing your Finances? N Y  Housekeeping or managing your Housekeeping?  [name]    Fall/Depression Screening:    [facility] 2/9 Scores 07/20/2015 06/28/2015 06/21/2015 06/19/2015 05/17/2015 04/19/2015 04/12/2015  PHQ - 2 Score 1 0 0 0 0 0 0    Assessment:   Chronic pain (groin area -right, left, mid), +7 today per pt.                            DM- blood sugar today 90.  View of pt's glucometer, averages 7 day 125, 14 day 122, 28 day 145.  Ranges                                   84- 267.                           HTN:  BP today 114/80 with nurse's BP cuff.    View of pt's readings range 118/44 to 140/63.   [facility] CM Care Plan Problem Two        Most Recent Value   Care Plan Problem Two  COPD- pt dealing with congestion -started 10/8   Role Documenting the Problem Two  Care Management Coordinator   Care Plan for Problem Two  Active   Interventions for Problem Two Long Term Goal   Discussed with pt f/u with Lung MD as scheduled, importance of hydration,   [facility] Long Term Goal (31-90) days  pt's congestion would be resolved in the next 31 days    [facility] Long Term Goal Start Date  06/30/15   [facility] Long Term Goal Met Date  08/02/15   [facility] CM Short Term Goal #1 (0-30 days)  Medication issues- pt will be compliant with taking medication (Prednisone) until f/u with Lung MD 11/9   [facility] CM Short Term Goal #1 Start Date  06/30/15   [facility] CM Short Term Goal #1 Met Date   07/12/15 Lorenso Courier in [place], pt f/u with MD, Prednisone completed. ]   Interventions for Short Term Goal #2    Discussed with pt to take Prednisone only as ordered by MD , pt reports received extra rx of Prednisone from pharmacy      [facility] Problem Three        Most Recent Value   Care Plan Problem Three  Elevated blood sugars    Role Documenting the Problem Three  Care Management Coordinator   Care Plan for Problem Three  Active   [facility]N CM Short Term Goal #4 (0-30 days)  Pt's A1C would be down one point in the next 30 days    [facility] CM Short Term Goal #4 Start Date  06/30/15   [facility] CM Short Term Goal #4 Met Date  -- [not met- pt to have A1c checked in January 2017]   Interventions for Short Term Goal #4  Discussed with pt to continue to check sugars 1-2 times a day, diet compliance        Plan:             Plan  to discharge pt today from RN CM services,  goals met, no further  Community case management needs.           Plan to inform Dr. Nori Riis of case closure through in basket in Epic (route today's encounter, send case closure  Letter.            Plan to inform Lattie Haw ([place] management assistant) to close case.         Zara Chess.   [facility] Care Management  [phone]

## 2015-08-02 NOTE — Telephone Encounter (Signed)
Pt is legally blind.  [facility] pharmacy needs verification of the type of meter she uses.  Because of her vision, she uses Prodigy.

## 2015-08-02 NOTE — Telephone Encounter (Signed)
Informed pt that we are awaiting for approval from insurance and then we can schedule. Katharina Caper, April D, [place]

## 2015-08-03 ENCOUNTER — Encounter: Payer: Self-pay | Admitting: Pharmacist

## 2015-08-03 ENCOUNTER — Ambulatory Visit (INDEPENDENT_AMBULATORY_CARE_PROVIDER_SITE_OTHER): Payer: Commercial Managed Care - HMO

## 2015-08-03 DIAGNOSIS — J309 Allergic rhinitis, unspecified: Secondary | ICD-10-CM | POA: Diagnosis not present

## 2015-08-03 NOTE — Telephone Encounter (Signed)
Appointment scheduled. Zimmerman Rumple, April D, CMA  

## 2015-08-07 ENCOUNTER — Telehealth: Payer: Self-pay | Admitting: *Deleted

## 2015-08-07 NOTE — Telephone Encounter (Signed)
Spoke with [place] with [facility] and new referral information given. [name] [name],CMA

## 2015-08-07 NOTE — Telephone Encounter (Signed)
Shelby with [facility] states that patient needs more visits added to her already existing referral.  She sees Dr. [name] and has allergy injections every 2 weeks and has used up all of her visits.  Her referral doesn't expire until 12/2015.  Will forward to referral coordinator to see if we can just add more to the referral for [employer] thn.  The diagnosis codes are J44.9, J45.909.  Her next appt for this is 08/10/15 at 12:15pm.  Jazmin Hartsell,CMA

## 2015-08-07 NOTE — Telephone Encounter (Signed)
Once the # of 6 visits in a silverback referral have been used, the referral in considered expired, even if referral dates state otherwise. New Silverback referral has been placed, Auth # [id number] For another 6 visits.

## 2015-08-09 ENCOUNTER — Encounter: Payer: Self-pay | Admitting: Internal Medicine

## 2015-08-09 NOTE — Telephone Encounter (Signed)
[name] [name] Any thoughts on a meter whe could use? THANKS! [name]

## 2015-08-09 NOTE — Telephone Encounter (Signed)
[place] and confirmed that pt did use the Prodigy meter due to not being able to see the numbers.  I also confirmed that they were no longer going to be carrying the prodigy and that the pt should contact some local pharmacies to see if they carry it. The pharmacy tech stated that she didn't know of any replacement that was comparable with voice. After talking to pharmacy I contacted pt and informed her of what I found out and she said that she should be good through February.  I told her she could start to contact the local pharmacies to see if anyone carried it and she understood.  I told her i would send to PCP to see if maybe she is aware of something else that may work like the one she uses due to her vision. Katharina Caper, Kynzleigh Bandel D, [place]

## 2015-08-10 ENCOUNTER — Ambulatory Visit (INDEPENDENT_AMBULATORY_CARE_PROVIDER_SITE_OTHER): Payer: Commercial Managed Care - HMO

## 2015-08-10 DIAGNOSIS — J309 Allergic rhinitis, unspecified: Secondary | ICD-10-CM | POA: Diagnosis not present

## 2015-08-17 ENCOUNTER — Ambulatory Visit (INDEPENDENT_AMBULATORY_CARE_PROVIDER_SITE_OTHER): Payer: Commercial Managed Care - HMO

## 2015-08-17 DIAGNOSIS — J309 Allergic rhinitis, unspecified: Secondary | ICD-10-CM

## 2015-08-18 ENCOUNTER — Ambulatory Visit (HOSPITAL_COMMUNITY)
Admission: RE | Admit: 2015-08-18 | Discharge: 2015-08-18 | Disposition: A | Discharge status: Home / Self Care | Payer: Commercial Managed Care - HMO | Source: Ambulatory Visit | Attending: Family Medicine | Admitting: Family Medicine

## 2015-08-18 DIAGNOSIS — S39013A Strain of muscle, fascia and tendon of pelvis, initial encounter: Secondary | ICD-10-CM | POA: Diagnosis not present

## 2015-08-18 DIAGNOSIS — X58XXXA Exposure to other specified factors, initial encounter: Secondary | ICD-10-CM | POA: Diagnosis not present

## 2015-08-18 DIAGNOSIS — S76212A Strain of adductor muscle, fascia and tendon of left thigh, initial encounter: Secondary | ICD-10-CM | POA: Diagnosis not present

## 2015-08-18 DIAGNOSIS — M25552 Pain in left hip: Secondary | ICD-10-CM | POA: Diagnosis not present

## 2015-08-18 DIAGNOSIS — R262 Difficulty in walking, not elsewhere classified: Secondary | ICD-10-CM | POA: Diagnosis not present

## 2015-08-21 ENCOUNTER — Telehealth: Payer: Self-pay | Admitting: Family Medicine

## 2015-08-21 DIAGNOSIS — M25551 Pain in right hip: Secondary | ICD-10-CM

## 2015-08-21 DIAGNOSIS — M25552 Pain in left hip: Secondary | ICD-10-CM

## 2015-08-21 DIAGNOSIS — H543 Unqualified visual loss, both eyes: Secondary | ICD-10-CM

## 2015-08-21 NOTE — Telephone Encounter (Signed)
Pt called because she would like to know what the MRI results were. She also said she wanted to know when her appointment at [facility] is. I didn't see any upcoming appointments. jw

## 2015-08-22 ENCOUNTER — Ambulatory Visit (HOSPITAL_COMMUNITY): Payer: Self-pay | Admitting: Psychiatry

## 2015-08-22 ENCOUNTER — Other Ambulatory Visit: Payer: Self-pay | Admitting: Family Medicine

## 2015-08-22 NOTE — Telephone Encounter (Signed)
Called agai abou MRI results

## 2015-08-22 NOTE — Telephone Encounter (Signed)
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 I am not sure if it is to the point that would need a hip replacement ---if she wants to be seen by an orthopedist I can set that up. UNCLEAR what they will tell her  RE: her appt with behavioral health---I don't have anything to do with that---she last saw Dr Charlcie Cradle at behavioral Health (that was set up by [place] I think) She could ask her [facility] or call outpt behavioral Health (sorry I do not have their number)  THANKS! [name]

## 2015-08-22 NOTE — Telephone Encounter (Signed)
LVM for pt to call our office. Please give her the info below if she calls. Ottis Stain, CMA

## 2015-08-23 ENCOUNTER — Encounter: Payer: Self-pay | Admitting: Family Medicine

## 2015-08-23 ENCOUNTER — Other Ambulatory Visit: Payer: Self-pay | Admitting: Family Medicine

## 2015-08-23 DIAGNOSIS — M25552 Pain in left hip: Secondary | ICD-10-CM | POA: Insufficient documentation

## 2015-08-23 DIAGNOSIS — M25551 Pain in right hip: Secondary | ICD-10-CM | POA: Insufficient documentation

## 2015-08-23 NOTE — Telephone Encounter (Signed)
Which eye doctor does she want to see? Is it for a check up?new glasses? What? I have placed the ortho referral THANKS! mitral regurgitation

## 2015-08-23 NOTE — Telephone Encounter (Addendum)
Pt understood results and would like to go see the orthopedist, she would also like a referral to Dr. Collene Mares (GI) and to the eye Dr. Please advise. Ottis Stain, CMA

## 2015-08-24 ENCOUNTER — Ambulatory Visit (INDEPENDENT_AMBULATORY_CARE_PROVIDER_SITE_OTHER): Payer: Commercial Managed Care - HMO

## 2015-08-24 ENCOUNTER — Ambulatory Visit (HOSPITAL_COMMUNITY): Payer: Self-pay | Admitting: Psychiatry

## 2015-08-24 DIAGNOSIS — J309 Allergic rhinitis, unspecified: Secondary | ICD-10-CM | POA: Diagnosis not present

## 2015-08-25 NOTE — Telephone Encounter (Signed)
Spoke with pt. Eye Dr. Is Monna Fam, the appt is for a check up. Ottis Stain, CMA

## 2015-08-29 ENCOUNTER — Other Ambulatory Visit: Payer: Self-pay | Admitting: *Deleted

## 2015-08-29 MED ORDER — METOPROLOL SUCCINATE ER 50 MG PO TB24: 150.0000 mg | ORAL_TABLET | Freq: Every day | ORAL | Status: DC

## 2015-08-29 MED ORDER — BECLOMETHASONE DIPROPIONATE 80 MCG/ACT IN AERS: 2.0000 | INHALATION_SPRAY | Freq: Every day | RESPIRATORY_TRACT | Status: DC

## 2015-08-31 ENCOUNTER — Ambulatory Visit (INDEPENDENT_AMBULATORY_CARE_PROVIDER_SITE_OTHER): Payer: Commercial Managed Care - HMO

## 2015-08-31 DIAGNOSIS — J309 Allergic rhinitis, unspecified: Secondary | ICD-10-CM

## 2015-09-01 DIAGNOSIS — M25551 Pain in right hip: Secondary | ICD-10-CM | POA: Diagnosis not present

## 2015-09-07 ENCOUNTER — Ambulatory Visit (INDEPENDENT_AMBULATORY_CARE_PROVIDER_SITE_OTHER): Payer: Commercial Managed Care - HMO

## 2015-09-07 DIAGNOSIS — J309 Allergic rhinitis, unspecified: Secondary | ICD-10-CM | POA: Diagnosis not present

## 2015-09-12 ENCOUNTER — Other Ambulatory Visit: Payer: Self-pay | Admitting: Family Medicine

## 2015-09-14 ENCOUNTER — Ambulatory Visit (INDEPENDENT_AMBULATORY_CARE_PROVIDER_SITE_OTHER): Payer: Commercial Managed Care - HMO

## 2015-09-14 DIAGNOSIS — J309 Allergic rhinitis, unspecified: Secondary | ICD-10-CM

## 2015-09-20 ENCOUNTER — Telehealth: Payer: Self-pay | Admitting: *Deleted

## 2015-09-20 MED ORDER — FLUTICASONE PROPIONATE HFA 110 MCG/ACT IN AERO: 2.0000 | INHALATION_SPRAY | Freq: Every day | RESPIRATORY_TRACT | Status: DC

## 2015-09-20 NOTE — Telephone Encounter (Signed)
Received a fax from [facility] requesting to change Qvar 80 mcg to Flovent [facility] for a lower cost.  Order given by Dr. Nori Riis to change to Flovent HFA 110 mcg.  Derl Barrow, RN

## 2015-09-21 ENCOUNTER — Ambulatory Visit (INDEPENDENT_AMBULATORY_CARE_PROVIDER_SITE_OTHER): Payer: Commercial Managed Care - HMO

## 2015-09-21 ENCOUNTER — Telehealth: Payer: Self-pay | Admitting: Internal Medicine

## 2015-09-21 ENCOUNTER — Ambulatory Visit (INDEPENDENT_AMBULATORY_CARE_PROVIDER_SITE_OTHER): Payer: Medicare HMO | Admitting: Psychiatry

## 2015-09-21 ENCOUNTER — Encounter (HOSPITAL_COMMUNITY): Payer: Self-pay | Admitting: Psychiatry

## 2015-09-21 VITALS — BP 121/70 | HR 80 | Ht 61.0 in | Wt 204.2 lb

## 2015-09-21 DIAGNOSIS — F411 Generalized anxiety disorder: Secondary | ICD-10-CM | POA: Diagnosis not present

## 2015-09-21 DIAGNOSIS — F331 Major depressive disorder, recurrent, moderate: Secondary | ICD-10-CM

## 2015-09-21 DIAGNOSIS — J309 Allergic rhinitis, unspecified: Secondary | ICD-10-CM | POA: Diagnosis not present

## 2015-09-21 MED ORDER — SERTRALINE HCL 100 MG PO TABS: ORAL_TABLET | ORAL | Status: DC

## 2015-09-21 MED ORDER — BUPROPION HCL 100 MG PO TABS: 100.0000 mg | ORAL_TABLET | Freq: Two times a day (BID) | ORAL | Status: DC

## 2015-09-21 NOTE — Telephone Encounter (Signed)
LMOMTCB x1 for pt Phone note 1/18 by her PCP shows they received fax from [facility] and they changed her over to flovent. Looks like this RX was sent in for her

## 2015-09-21 NOTE — Progress Notes (Signed)
Patient ID: [name] [name], female   DOB: Jul 16, 1951, 65 y.o.   MRN: [id number]  [place] [id number] Progress Note  [name] [name] [id number] 65 y.o.  09/21/2015 3:25 PM  Chief Complaint: depression getting better  History of Present Illness: Her son moved out last weekend and pt is glad. She states she can't deal with him anymore.   Reports about once a week she feels depressed due to stressors and physical health. Pt has sad mood on those days. States she is unable to cry. Denies isolation, anhedonia, low motivation, worthlessness and hopelessness. Pt tries to keep herself busy.   Sleep is variable but she uses her CPAP every night. Some nights it takes her hours to fall asleep. Appetite is increased and she is eating a lot of sweets and carbs. Pt is eating 2 meals a day.  Energy remains low. Concentration is ok.   Anxiety is mild and tolerable. States she doesn't notice it much.    Pt is taking meds as prescribed and reports Wellbutrin may be causing constipation. States Zoloft makes her relaxed and sleepy.   Suicidal Ideation: No Plan Formed: No Patient has means to carry out plan: No  Homicidal Ideation: No Plan Formed: No Patient has means to carry out plan: No  Review of Systems: Psychiatric: Agitation: No Hallucination: No Depressed Mood: Yes Insomnia: Yes Hypersomnia: No Altered Concentration: No Feels Worthless: No Grandiose Ideas: No Belief In Special Powers: No New/Increased Substance Abuse: No Compulsions: No  Review of Systems  Constitutional: Negative for fever, chills and weight loss.  HENT: Negative for ear pain and sore throat.   Eyes: Positive for blurred vision. Negative for double vision and pain.  Respiratory: Negative for cough, shortness of breath and wheezing.   Cardiovascular: Positive for leg swelling. Negative for chest pain and palpitations.  Gastrointestinal: Positive for constipation. Negative for heartburn, nausea,  vomiting and abdominal pain.  Musculoskeletal: Positive for back pain, joint pain and neck pain.  Skin: Negative for itching and rash.  Neurological: Positive for sensory change. Negative for dizziness, tremors, seizures, loss of consciousness and headaches.  Psychiatric/Behavioral: Positive for depression. Negative for suicidal ideas, hallucinations and substance abuse. The patient has insomnia. The patient is not nervous/anxious.      Past Medical Family, Social History: She was born and raised in [place]. 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 Patient was born with bilateral congenital cataracts and had her first surgery at age 63 and her second surgery in 33. She finished high school and subsequently worked. She was never married and had her son at the age of 19 raised him as a single mother.Patient is on Social Security disability.  Social History   Social History  . Marital Status: Single    Spouse Name: N/A  . Number of Children: 1  . Years of Education: 12   Occupational History  . unemployed    Social History Main Topics  . Smoking status: Former Smoker -- 0.50 packs/day for .5 years    Types: Cigarettes    Quit date: 09/03/1975  . Smokeless tobacco: Never Used  . Alcohol Use: No  . Drug Use: No  . Sexual Activity: Not Currently   Other Topics Concern  . None   Social History Narrative   Approved for section 8 housing (12/03).         Health Care POA:    Emergency Contact: sister, catherine Mattila [phone]   End of Life Plan:    Who  lives with you: Has a son who may be autistic and stays with her at times.   Any pets: none   Diet: Pt has a varied diet of protein, starch, and vegetables.   Exercise: Pt walks 1x a week with [place] [facility] [employer].   Seatbelts: Pt reports wearing seatbelt when in vehicles.    Hobbies: walking, tv, church, eating         Family History  Problem Relation Age of Onset  . Diabetes Mother   . Colon  cancer Neg Hx   . Depression Neg Hx   . Anxiety disorder Neg Hx     Past Medical History  Diagnosis Date  . Chronic cough     Now followed by pulmonary  . Sleep apnea     on CPAP 12  . HTN (hypertension)   . Dyslipidemia   . Gout   . History of colonoscopy   . Concussion 11/18/11    "fell at dr's office; hit head"  . Complication of anesthesia     "just wake up coughing; that's all"  . Heart murmur   . Asthma   . COPD (chronic obstructive pulmonary disease) ([facility])   . Diabetes mellitus type II     "take insulin & pills"  . Blood transfusion   . GERD (gastroesophageal reflux disease)   . Arthritis   . Chronic lower back pain   . Vocal cord paralysis, unilateral partial     "right"  . Depression   . Peripheral neuropathy ([facility])   . Angina   . Pneumonia     "i've had it once" (11/18/11)  . Exertional dyspnea   . Headache(784.0) 11/18/11    "I've had mild headaches the last couple days"  . Headache(784.0) 01/08/12    "pretty constant since 11/18/11's concussion"  . Syncope 06/16/2012  . Allergy   . Anxiety   . Blood transfusion without reported diagnosis   . Cataract   . Glaucoma   . Myocardial infarction ([place])   . Osteoporosis     Outpatient Encounter Prescriptions as of 09/21/2015  Medication Sig  . acetaminophen (TYLENOL) 325 MG tablet Take 650 mg by mouth every 6 (six) hours as needed (pain).   [name] acetaminophen-codeine (TYLENOL #3) 300-30 MG tablet Take 1 tablet by mouth daily as needed (pain).  [name] acyclovir (ZOVIRAX) 200 MG capsule Take 200 mg by mouth 2 (two) times daily.  [name] albuterol (PROVENTIL) (2.5 MG/3ML) 0.083% nebulizer solution Take 2.5 mg by nebulization every 6 (six) hours as needed for wheezing or shortness of breath.  . Alcohol Swabs (B-D SINGLE USE SWABS REGULAR) PADS USE THREE TIMES DAILY  . allopurinol (ZYLOPRIM) 300 MG tablet TAKE 1 TABLET EVERY DAY (Patient taking differently: TAKE 1 TABLET BY MOUTH EVERY DAY)  . ammonium lactate (LAC-HYDRIN) 12 %  lotion Apply 1 application topically 2 (two) times daily as needed for dry skin.  [name] aspirin EC 81 MG tablet Take 81 mg by mouth daily.  [name] atorvastatin (LIPITOR) 80 MG tablet Take 1 tablet (80 mg total) by mouth daily.  [name] azelastine (ASTELIN) 0.1 % nasal spray Place 2 sprays into both nostrils daily. Use in each nostril as directed  . B-D ULTRAFINE III SHORT PEN 31G X 8 MM MISC USE THREE TIMES DAILY  . Blood Glucose Monitoring Suppl (ACCU-CHEK AVIVA PLUS) W/DEVICE KIT Use to check blood sugar 3-4 times daily  . brimonidine (ALPHAGAN P) 0.1 % SOLN Place 1 drop into the left eye  2 (two) times daily.   [name] buPROPion (WELLBUTRIN) 100 MG tablet Take 1 tablet (100 mg total) by mouth 2 (two) times daily.  [name] EPINEPHrine 0.3 mg/0.3 mL IJ SOAJ injection Inject 0.3 mLs (0.3 mg total) into the muscle once as needed (for allergic reaction).  [name] esomeprazole (NEXIUM) 40 MG capsule Take 1 capsule (40 mg total) by mouth daily.  . ferrous sulfate 325 (65 FE) MG tablet Take 325 mg by mouth daily.  . fluticasone (FLOVENT HFA) 110 MCG/ACT inhaler Inhale 2 puffs into the lungs daily.  . furosemide (LASIX) 40 MG tablet Take 40 mg by mouth 2 (two) times daily.  [name] glucose blood test strip Test blood sugar 3-4 times daily  . hydrocortisone (ANUSOL-HC) 2.5 % rectal cream Place 1 application rectally 2 (two) times daily.  . Lancets MISC Use twice or three times daily to check blood sugar  . LANTUS SOLOSTAR 100 UNIT/ML Solostar Pen INJECT  65 UNITS SUBCUTANEOUSLY EVERY EVENING  . latanoprost (XALATAN) 0.005 % ophthalmic solution Place 1 drop into both eyes at bedtime.  [name] losartan (COZAAR) 100 MG tablet TAKE 1 TABLET EVERY DAY (Patient taking differently: TAKE 1 TABLET BY MOUTH  EVERY DAY)  . metoprolol succinate (TOPROL-XL) 50 MG 24 hr tablet Take 3 tablets (150 mg total) by mouth daily. Take with or immediately following a meal.  . montelukast (SINGULAIR) 10 MG tablet TAKE 1 TABLET EVERY DAY (Patient taking differently: TAKE 1  TABLET BY MOUTH EVERY DAY)  . Multiple Vitamins-Minerals (MULTIVITAMIN PO) Take 1 tablet by mouth daily.  . nitroGLYCERIN (NITROSTAT) 0.4 MG SL tablet Place 0.4 mg under the tongue every 5 (five) minutes as needed for chest pain.  . NONFORMULARY OR COMPOUNDED ITEM Allergy Vaccine 1:10 Given at [facility] Pulmonary  . NOVOLOG FLEXPEN 100 UNIT/ML FlexPen INJECT 30 UNITS SUBCUTANEOUSLY WITH THE EARLY MEAL AND 30 UNITS WITH THE LATE MEAL  . salicylic acid 17 % gel Apply topically 2 (two) times daily. Apply small amount to callous area on bottom of foot BID  . sertraline (ZOLOFT) 100 MG tablet TAKE 1 AND 1/2 TABLETS AT BEDTIME  . spironolactone (ALDACTONE) 25 MG tablet TAKE 1 TABLET (25 MG TOTAL) BY MOUTH DAILY BEFORE SUPPER.  . traMADol (ULTRAM) 50 MG tablet Take 2 tablets (100 mg total) by mouth every 12 (twelve) hours as needed.  . TRULICITY 7.48 OL/0.7EM SOPN INJECT  0.75MG (0.5ML) SUBCUTANEOUSLY ONE TIME WEEKLY  . VENTOLIN HFA 108 (90 Base) MCG/ACT inhaler INHALE 2 PUFFS INTO THE LUNGS EVERY 6  HOURS AS NEEDED FOR WHEEZING OR SHORTNESS OF BREATH.  [name] Vitamin D, Ergocalciferol, (DRISDOL) 50000 UNITS CAPS capsule Take 1 capsule (50,000 Units total) by mouth every 30 (thirty) days. Take on 3rd of the month  . bisacodyl (DULCOLAX) 5 MG EC tablet Take 5 mg by mouth daily as needed. Reported on 09/21/2015  . predniSONE (DELTASONE) 20 MG tablet Take 2 tablets (50m) a day until the bottle is finished (Patient not taking: Reported on 08/02/2015)   No facility-administered encounter medications on file as of 09/21/2015.    Past Psychiatric History/Hospitalization(s): Anxiety: Yes Bipolar Disorder: No Depression: Yes Mania: No Psychosis: No Schizophrenia: No Personality Disorder: No Hospitalization for psychiatric illness: No History of Electroconvulsive Shock Therapy: No Prior Suicide Attempts: No  Physical Exam: Constitutional:  BP 121/70 mmHg  Pulse 80  Ht 5' 1"  (1.549 m)  Wt 204 lb 3.2 oz  (92.625 kg)  BMI 38.60 kg/m2  General Appearance: alert, oriented, no acute distress  Musculoskeletal: Strength & Muscle Tone: decreased and walks with cane Gait & Station: unsteady Patient leans: Front  Mental Status Examination/Evaluation: Objective: Attitude: Calm and cooperative  Appearance: Casual, appears to be stated age  Eye Contact::  Good has glaucoma and difficulty seeing despite using her glasses  Speech:  Clear and Coherent and Normal Rate  Volume:  Normal  Mood:  euthymic  Affect:  Full Range  Thought Process:  Goal Directed  Orientation:  Full (Time, Place, and Person)  Thought Content:  Negative  Suicidal Thoughts:  No  Homicidal Thoughts:  No  Judgement:  Good  Insight:  Good  Concentration: good  Memory: Immediate-good Recent-good Remote-good  Recall: fair  Language: fair  Gait and Station: normal  [employer] of Knowledge: average  Psychomotor Activity:  Normal  Akathisia:  No  Handed:  Right  AIMS (if indicated): n/a   Assets:  Communication Skills Desire for Improvement Housing Leisure Time Resilience Social Support       Medical Decision Making (Choose Three): Established Problem, Stable/Improving (1), Review of Psycho-Social Stressors (1) and Review of Medication Regimen & Side Effects (2)  Assessment: MDD- recurrent, moderate; GAD  #1 Maj. depression recurrent chronic moderate. Wellbutrin  288m BID by mouth every morning. For constipation recommend fiber or laxative.  Zoloft 150 mg by mouth every a.m. #2 generalized anxiety disorder. Wellbutrin and Zoloft Also discussed relaxations and deep breathing techniques with her and patient has agreed to practice them at home. States at some point she would like to get off Zoloft and Wellbutrin in the future.  #3 Multiple medical problems- Will be treated by her PCP Dr. SMallie Mussel #4 nutrition- Discussed the importance of nutrition and psychoeducation regarding nutrition given her multiple  health problems was discussed with her. Patient stated understanding and also encouraged her to walk a little more so that she moves.  Medication management with supportive therapy. Risks/benefits and SE of the medication discussed. Pt verbalized understanding and verbal consent obtained for treatment.  Affirm with the patient that the medications are taken as ordered. Patient expressed understanding of how their medications were to be used.   Therapy: brief supportive therapy provided. Discussed psychosocial stressors in detail.    Labs: reviewed 03/20/2015 glue 277, HbA1c on 04/12/2015 was 8.6  Pt denies SI and is at an acute low risk for suicide.Patient told to call clinic if any problems occur. Patient advised to go to ER if they should develop SI/HI, side effects, or if symptoms worsen. Has crisis numbers to call if needed. Pt verbalized understanding.  F/up in 3 months or sooner if needed   ACharlcie Cradle MD 09/21/2015

## 2015-09-22 NOTE — Telephone Encounter (Signed)
lmtcb x1 for pt. 

## 2015-09-22 NOTE — Telephone Encounter (Signed)
lmtcb X2 for pt.  

## 2015-09-22 NOTE — Telephone Encounter (Signed)
Pt is aware that her PCP has sent in this prescription. Nothing further was needed.

## 2015-09-22 NOTE — Telephone Encounter (Signed)
[phone] calling back

## 2015-09-28 ENCOUNTER — Ambulatory Visit (INDEPENDENT_AMBULATORY_CARE_PROVIDER_SITE_OTHER): Payer: Commercial Managed Care - HMO

## 2015-09-28 DIAGNOSIS — J309 Allergic rhinitis, unspecified: Secondary | ICD-10-CM

## 2015-10-04 ENCOUNTER — Ambulatory Visit (INDEPENDENT_AMBULATORY_CARE_PROVIDER_SITE_OTHER): Payer: Commercial Managed Care - HMO

## 2015-10-04 DIAGNOSIS — J309 Allergic rhinitis, unspecified: Secondary | ICD-10-CM

## 2015-10-12 ENCOUNTER — Ambulatory Visit (INDEPENDENT_AMBULATORY_CARE_PROVIDER_SITE_OTHER): Payer: Commercial Managed Care - HMO

## 2015-10-12 DIAGNOSIS — J309 Allergic rhinitis, unspecified: Secondary | ICD-10-CM

## 2015-10-13 DIAGNOSIS — M87051 Idiopathic aseptic necrosis of right femur: Secondary | ICD-10-CM | POA: Diagnosis not present

## 2015-10-13 DIAGNOSIS — M87052 Idiopathic aseptic necrosis of left femur: Secondary | ICD-10-CM | POA: Diagnosis not present

## 2015-10-19 ENCOUNTER — Ambulatory Visit (INDEPENDENT_AMBULATORY_CARE_PROVIDER_SITE_OTHER): Payer: Commercial Managed Care - HMO

## 2015-10-19 DIAGNOSIS — J309 Allergic rhinitis, unspecified: Secondary | ICD-10-CM

## 2015-10-20 ENCOUNTER — Encounter: Payer: Self-pay | Admitting: Internal Medicine

## 2015-10-20 ENCOUNTER — Ambulatory Visit (INDEPENDENT_AMBULATORY_CARE_PROVIDER_SITE_OTHER): Payer: Commercial Managed Care - HMO | Admitting: Internal Medicine

## 2015-10-20 VITALS — BP 128/58 | HR 68 | Ht 61.0 in | Wt 203.0 lb

## 2015-10-20 DIAGNOSIS — I10 Essential (primary) hypertension: Secondary | ICD-10-CM

## 2015-10-20 DIAGNOSIS — E785 Hyperlipidemia, unspecified: Secondary | ICD-10-CM | POA: Diagnosis not present

## 2015-10-20 LAB — CBC
HCT: 34.6 % — ABNORMAL LOW (ref 36.0–46.0)
Hemoglobin: 11.2 g/dL — ABNORMAL LOW (ref 12.0–15.0)
MCH: 28.7 pg (ref 26.0–34.0)
MCHC: 32.4 g/dL (ref 30.0–36.0)
MCV: 88.7 fL (ref 78.0–100.0)
MPV: 10.3 fL (ref 8.6–12.4)
Platelets: 186 10*3/uL (ref 150–400)
RBC: 3.9 MIL/uL (ref 3.87–5.11)
RDW: 15.6 % — ABNORMAL HIGH (ref 11.5–15.5)
WBC: 9.3 10*3/uL (ref 4.0–10.5)

## 2015-10-20 LAB — BASIC METABOLIC PANEL
BUN: 9 mg/dL (ref 7–25)
CO2: 32 mmol/L — ABNORMAL HIGH (ref 20–31)
Calcium: 9 mg/dL (ref 8.6–10.4)
Chloride: 103 mmol/L (ref 98–110)
Creat: 0.76 mg/dL (ref 0.50–0.99)
Glucose, Bld: 102 mg/dL — ABNORMAL HIGH (ref 65–99)
Potassium: 3.9 mmol/L (ref 3.5–5.3)
Sodium: 144 mmol/L (ref 135–146)

## 2015-10-20 LAB — LIPID PANEL
Cholesterol: 121 mg/dL — ABNORMAL LOW (ref 125–200)
HDL: 33 mg/dL — ABNORMAL LOW (ref >=46)
LDL Cholesterol: 72 mg/dL (ref <130)
Total CHOL/HDL Ratio: 3.7 Ratio (ref <=5.0)
Triglycerides: 82 mg/dL (ref <150)
VLDL: 16 mg/dL (ref <30)

## 2015-10-20 NOTE — Progress Notes (Signed)
Cardiology Office Note   Date:  10/20/2015   ID:  Vannie, Hilgert November 16, 1950, MRN [id number]  PCP:  [name], MD  Cardiologist:   Dorris Carnes, MD   F/U of HTN and CP    History of Present Illness: [name] [name] is a 65 y.o. female with a history of minimal CAD at cath with mild elevation of pulmonary pressures   (2015)  Also a nistory of HTN and HL   Has occasional sharp pain in chest  Not always associated with activity Did have SOB with hill on [place]  Off of prednisone   BP recordings at home good    Current Outpatient Prescriptions  Medication Sig Dispense Refill  . acetaminophen (TYLENOL) 325 MG tablet Take 650 mg by mouth every 6 (six) hours as needed (pain).     [name] acetaminophen-codeine (TYLENOL #3) 300-30 MG tablet Take 1 tablet by mouth daily as needed (pain). 40 tablet 0  . acyclovir (ZOVIRAX) 200 MG capsule Take 200 mg by mouth 2 (two) times daily.    [name] albuterol (PROVENTIL) (2.5 MG/3ML) 0.083% nebulizer solution Take 2.5 mg by nebulization every 6 (six) hours as needed for wheezing or shortness of breath.    . Alcohol Swabs (B-D SINGLE USE SWABS REGULAR) PADS USE THREE TIMES DAILY 300 each 3  . allopurinol (ZYLOPRIM) 300 MG tablet TAKE 1 TABLET EVERY DAY (Patient taking differently: TAKE 1 TABLET BY MOUTH EVERY DAY) 90 tablet 3  . ammonium lactate (LAC-HYDRIN) 12 % lotion Apply 1 application topically 2 (two) times daily as needed for dry skin.    [name] aspirin EC 81 MG tablet Take 81 mg by mouth daily.    [name] atorvastatin (LIPITOR) 40 MG tablet Take 1 tablet by mouth daily.    [name] azelastine (ASTELIN) 0.1 % nasal spray Place 2 sprays into both nostrils daily. Use in each nostril as directed    . B-D ULTRAFINE III SHORT PEN 31G X 8 MM MISC USE THREE TIMES DAILY 270 each 3  . bisacodyl (DULCOLAX) 5 MG EC tablet Take 5 mg by mouth daily as needed. Reported on 09/21/2015    . Blood Glucose Monitoring Suppl (ACCU-CHEK AVIVA PLUS) W/DEVICE KIT Use to check  blood sugar 3-4 times daily 1 kit 0  . brimonidine (ALPHAGAN P) 0.1 % SOLN Place 1 drop into the left eye 2 (two) times daily.     [name] buPROPion (WELLBUTRIN) 100 MG tablet Take 1 tablet (100 mg total) by mouth 2 (two) times daily. 180 tablet 0  . EPINEPHrine 0.3 mg/0.3 mL IJ SOAJ injection Inject 0.3 mLs (0.3 mg total) into the muscle once as needed (for allergic reaction). 1 Device 3  . esomeprazole (NEXIUM) 40 MG capsule Take 1 capsule (40 mg total) by mouth daily. 90 capsule 3  . ferrous sulfate 325 (65 FE) MG tablet Take 325 mg by mouth daily.    . fluticasone (FLOVENT HFA) 110 MCG/ACT inhaler Inhale 2 puffs into the lungs daily. 1 Inhaler 3  . furosemide (LASIX) 40 MG tablet Take 40 mg by mouth 2 (two) times daily.    [name] glucose blood test strip Test blood sugar 3-4 times daily 400 each 12  . hydrocortisone (ANUSOL-HC) 2.5 % rectal cream Place 1 application rectally 2 (two) times daily. 30 g 2  . Lancets MISC Use twice or three times daily to check blood sugar 200 each 12  . LANTUS SOLOSTAR 100 UNIT/ML Solostar Pen INJECT  65 UNITS SUBCUTANEOUSLY EVERY  EVENING 60 mL 3  . latanoprost (XALATAN) 0.005 % ophthalmic solution Place 1 drop into both eyes at bedtime.    [name] losartan (COZAAR) 100 MG tablet TAKE 1 TABLET EVERY DAY (Patient taking differently: TAKE 1 TABLET BY MOUTH  EVERY DAY) 90 tablet 3  . metoprolol succinate (TOPROL-XL) 50 MG 24 hr tablet Take 3 tablets (150 mg total) by mouth daily. Take with or immediately following a meal. 270 tablet 3  . montelukast (SINGULAIR) 10 MG tablet TAKE 1 TABLET EVERY DAY (Patient taking differently: TAKE 1 TABLET BY MOUTH EVERY DAY) 90 tablet 3  . Multiple Vitamins-Minerals (MULTIVITAMIN PO) Take 1 tablet by mouth daily.    . nitroGLYCERIN (NITROSTAT) 0.4 MG SL tablet Place 0.4 mg under the tongue every 5 (five) minutes as needed for chest pain.    . NONFORMULARY OR COMPOUNDED ITEM Allergy Vaccine 1:10 Given at [facility] Pulmonary    . NOVOLOG FLEXPEN 100  UNIT/ML FlexPen INJECT 30 UNITS SUBCUTANEOUSLY WITH THE EARLY MEAL AND 30 UNITS WITH THE LATE MEAL 60 mL 5  . predniSONE (DELTASONE) 20 MG tablet Take 2 tablets (31m) a day until the bottle is finished 10 tablet 0  . salicylic acid 17 % gel Apply topically 2 (two) times daily. Apply small amount to callous area on bottom of foot BID 14 g 3  . sertraline (ZOLOFT) 100 MG tablet TAKE 1 AND 1/2 TABLETS AT BEDTIME 135 tablet 0  . spironolactone (ALDACTONE) 25 MG tablet TAKE 1 TABLET (25 MG TOTAL) BY MOUTH DAILY BEFORE SUPPER. 90 tablet 3  . traMADol (ULTRAM) 50 MG tablet Take 2 tablets (100 mg total) by mouth every 12 (twelve) hours as needed. 60 tablet 2  . TRULICITY 03.79MKW/4.0XBSOPN INJECT  0.75MG (0.5ML) SUBCUTANEOUSLY ONE TIME WEEKLY 12 pen 12  . VENTOLIN HFA 108 (90 Base) MCG/ACT inhaler INHALE 2 PUFFS INTO THE LUNGS EVERY 6  HOURS AS NEEDED FOR WHEEZING OR SHORTNESS OF BREATH. 54 g 3  . Vitamin D, Ergocalciferol, (DRISDOL) 50000 UNITS CAPS capsule Take 1 capsule (50,000 Units total) by mouth every 30 (thirty) days. Take on 3rd of the month     No current facility-administered medications for this visit.    Allergies:   Bee venom; Propoxyphene hcl; Amlodipine besylate; Hydrocodone; Lisinopril; Metformin and related; Metronidazole; and Valsartan   Past Medical History  Diagnosis Date  . Chronic cough     Now followed by pulmonary  . Sleep apnea     on CPAP 12  . HTN (hypertension)   . Dyslipidemia   . Gout   . History of colonoscopy   . Concussion 11/18/11    "fell at dr's office; hit head"  . Complication of anesthesia     "just wake up coughing; that's all"  . Heart murmur   . Asthma   . [place]   . Diabetes mellitus type II     "take insulin & pills"  . Blood transfusion   . GERD (gastroesophageal reflux disease)   . Arthritis   . Chronic lower back pain   . Vocal cord paralysis, unilateral partial     "right"  . Depression   .  [place]ipheral [place]   . Angina   . Pneumonia     "i've had it once" (11/18/11)  . Exertional dyspnea   . Headache(784.0) 11/18/11    "I've had mild headaches the last couple days"  . Headache(784.0) 01/08/12    "pretty constant since  11/18/11's concussion"  . Syncope 06/16/2012  . Allergy   . Anxiety   . Blood transfusion without reported diagnosis   . Cataract   . Glaucoma   . Myocardial infarction ([place])   . Osteoporosis     Past Surgical History  Procedure Laterality Date  . Colonoscopy w/ biopsies  11/21/2010    adenoma polyp--no hi grade dysplasia Dr Collene Mares  . Treatment fistula anal    . Breast reduction surgery  04-21-2003  . Tubal ligation  1987  . Cardiac catheterization  03-02-2004  . Bronchoscopy  07-2007  . Knee arthroscopy  08/2000    right  . Shoulder arthroscopy  08/2000    right  . T-score  09/02/04    -0.54 (low risk currently)  . Total abdominal hysterectomy  10-07-2000  . Cesarean section  1987  . Shoulder arthroscopy w/ rotator cuff repair  10/18/2003    left  . Cataract extraction  1955; 1979    bilateral; right eye  . Eye surgery    . Breast biopsy      bilaterally  . Breast cyst excision      right twice; left once  . Carpal tunnel release      left  . Anterior cervical decomp/discectomy fusion  01/15/2012    Procedure: ANTERIOR CERVICAL DECOMPRESSION/DISCECTOMY FUSION 3 LEVELS;  Surgeon: Eustace Moore, MD;  Location: [facility];  Service: Neurosurgery;  Laterality: N/A;  Anterior Cervical Decompression Discectomy Fusion Cerivcal three four, cervical four five, cervical five six, cervical six seven  . Left and right heart catheterization with coronary angiogram N/A 01/19/2014    Procedure: LEFT AND RIGHT HEART CATHETERIZATION WITH CORONARY ANGIOGRAM;  Surgeon: Sinclair Grooms, MD;  Location: [facility] CATH LAB;  Service: Cardiovascular;  Laterality: N/A;  . Spine surgery       Social History:  The patient  reports that she quit smoking about 40 years  ago. Her smoking use included Cigarettes. She has a .25 pack-year smoking history. She has never used smokeless tobacco. She reports that she does not drink alcohol or use illicit drugs.   Family History:  The patient's family history includes Diabetes in her mother. There is no history of Colon cancer, Depression, or Anxiety disorder.    ROS:  Please see the history of present illness. All other systems are reviewed and  Negative to the above problem except as noted.    PHYSICAL EXAM: VS:  BP 128/58 mmHg  Pulse 68  Ht _0  (1.549 m)  Wt 92.08 kg (203 lb)  BMI 38.38 kg/m2  GEN: Morbidly obese 65 yo, in no acute distress HEENT: normal Neck: no JVD, carotid bruits, or masses Cardiac: RRR; Gr I-II/VI systolic murmur base , rubs, or gallops,no edema  Respiratory:  clear to auscultation bilaterally, normal work of breathing GI: soft, nontender, nondistended, + BS  No hepatomegaly  MS: no deformity Moving all extremities   Skin: warm and dry, no rash Neuro:  Strength and sensation are intact Psych: euthymic mood, full affect   EKG:  EKG is ordered today.  SR 68 bpm     Lipid Panel    Component Value Date/Time   CHOL 179 09/30/2014 1234   TRIG 203* 09/30/2014 1234   HDL 34* 09/30/2014 1234   CHOLHDL 5.3 09/30/2014 1234   VLDL 41* 09/30/2014 1234   LDLCALC 104* 09/30/2014 1234   LDLDIRECT 112* 02/09/2014 1216      Wt Readings from Last 3 Encounters:  10/20/15  92.08 kg (203 lb)  09/21/15 92.625 kg (204 lb 3.2 oz)  07/12/15 93.441 kg (206 lb)      ASSESSMENT AND PLAN:  1.  HTN  BP has been good at home  Continue meds  Check BMET  2.  CAD  Minimal at cath  Has intermitt CP that I am not convinced represents angina.    3.  HL  Check lipids     Disposition:   FU with me in Nov 2017    Signed, Dorris Carnes, MD  10/20/2015 12:02 PM    [address], [place], [place]  [address] Phone: [phone]; Fax: [phone]

## 2015-10-20 NOTE — Patient Instructions (Signed)
Your physician recommends that you continue on your current medications as directed. Please refer to the Current Medication list given to you today. Your physician recommends that you return for lab work today (bmet, cbc, lipid)  Your physician wants you to follow-up in: November, 2017 with Dr. Harrington Challenger.  You will receive a reminder letter in the mail two months in advance. If you don't receive a letter, please call our office to schedule the follow-up appointment.

## 2015-10-23 ENCOUNTER — Telehealth: Payer: Self-pay | Admitting: Internal Medicine

## 2015-10-23 NOTE — Telephone Encounter (Signed)
Returning call from earlier today.She thinks it is about her lab results.

## 2015-10-24 NOTE — Telephone Encounter (Signed)
Left message for patient to call back on home and mobile numbers. Will review lab results when she calls back.

## 2015-10-25 ENCOUNTER — Other Ambulatory Visit: Payer: Self-pay | Admitting: Family Medicine

## 2015-10-26 ENCOUNTER — Ambulatory Visit (INDEPENDENT_AMBULATORY_CARE_PROVIDER_SITE_OTHER): Payer: Commercial Managed Care - HMO

## 2015-10-26 DIAGNOSIS — J309 Allergic rhinitis, unspecified: Secondary | ICD-10-CM

## 2015-10-27 ENCOUNTER — Telehealth: Payer: Self-pay | Admitting: Family Medicine

## 2015-10-27 NOTE — Telephone Encounter (Signed)
Pt brought in physical forms to be completed so she can attend [facility]. She needs them completed ASAP

## 2015-10-30 NOTE — Telephone Encounter (Signed)
Clinic portion of form completed and placed in provider's box. Jazmin Hartsell,CMA

## 2015-10-31 ENCOUNTER — Encounter: Payer: Self-pay | Admitting: Family Medicine

## 2015-10-31 NOTE — Telephone Encounter (Signed)
I will attend to it when I get to [name] about 11:45 today [name] [name]

## 2015-10-31 NOTE — Telephone Encounter (Signed)
Patient informed that form is complete and ready for pick up.  Labron Bloodgood L, RN  

## 2015-11-02 ENCOUNTER — Ambulatory Visit (INDEPENDENT_AMBULATORY_CARE_PROVIDER_SITE_OTHER): Payer: Commercial Managed Care - HMO | Admitting: *Deleted

## 2015-11-02 DIAGNOSIS — J309 Allergic rhinitis, unspecified: Secondary | ICD-10-CM

## 2015-11-08 ENCOUNTER — Ambulatory Visit (INDEPENDENT_AMBULATORY_CARE_PROVIDER_SITE_OTHER): Payer: Commercial Managed Care - HMO | Admitting: *Deleted

## 2015-11-08 DIAGNOSIS — J309 Allergic rhinitis, unspecified: Secondary | ICD-10-CM | POA: Diagnosis not present

## 2015-11-09 DIAGNOSIS — M79604 Pain in right leg: Secondary | ICD-10-CM | POA: Diagnosis not present

## 2015-11-09 DIAGNOSIS — M87051 Idiopathic aseptic necrosis of right femur: Secondary | ICD-10-CM | POA: Diagnosis not present

## 2015-11-09 DIAGNOSIS — M79605 Pain in left leg: Secondary | ICD-10-CM | POA: Diagnosis not present

## 2015-11-09 DIAGNOSIS — M87052 Idiopathic aseptic necrosis of left femur: Secondary | ICD-10-CM | POA: Diagnosis not present

## 2015-11-10 ENCOUNTER — Ambulatory Visit (INDEPENDENT_AMBULATORY_CARE_PROVIDER_SITE_OTHER): Payer: Commercial Managed Care - HMO | Admitting: Family Medicine

## 2015-11-10 ENCOUNTER — Encounter: Payer: Self-pay | Admitting: Family Medicine

## 2015-11-10 VITALS — BP 122/53 | HR 73 | Temp 97.9°F | Wt 198.6 lb

## 2015-11-10 DIAGNOSIS — E1149 Type 2 diabetes mellitus with other diabetic neurological complication: Secondary | ICD-10-CM

## 2015-11-10 LAB — POCT GLYCOSYLATED HEMOGLOBIN (HGB A1C): Hemoglobin A1C: 7.3

## 2015-11-10 NOTE — Assessment & Plan Note (Signed)
Elevated blood sugars, likely caused by bilateral hip steroid injection.  Blood sugars are decreasing today, and her additional symptoms have resolved.  - No signs of DKA - Recommended continuing to follow blood sugars for the next 3-5 days and return to clinic if consistently over running greater than 300.  - Hemoglobin A1c today at Goal: 7.3

## 2015-11-10 NOTE — Progress Notes (Signed)
   Subjective:    Patient ID: [name] [name], female    DOB: 1951/04/20, 65 y.o.   MRN: [id number]  Seen for Same day visit for   CC: Elevated blood sugars  She reports her blood sugars have been running high 200s to 400s since she received bilateral steroid injections in her hips yesterday.  She reports she had excessive thirst, urination, dry mouth and difficulty sleeping last night; however, the symptoms are improved today.  She denies any confusion, dizziness, lightheadedness, fevers.  Her blood sugars are doing better today: 200s.   Review of Systems   See HPI for ROS. Objective:  BP 122/53 mmHg  Pulse 73  Temp(Src) 97.9 F (36.6 C) (Oral)  Wt 198 lb 9.6 oz (90.084 kg)  General: NAD Cardiac: RRR, normal heart sounds, no murmurs Respiratory: CTAB, normal effort    Assessment & Plan:   Diabetes mellitus type 2 with neurological manifestations Elevated blood sugars, likely caused by bilateral hip steroid injection.  Blood sugars are decreasing today, and her additional symptoms have resolved.  - No signs of DKA - Recommended continuing to follow blood sugars for the next 3-5 days and return to clinic if consistently over running greater than 300.  - Hemoglobin A1c today at Goal: 7.3

## 2015-11-16 ENCOUNTER — Ambulatory Visit (INDEPENDENT_AMBULATORY_CARE_PROVIDER_SITE_OTHER): Payer: Commercial Managed Care - HMO | Admitting: *Deleted

## 2015-11-16 DIAGNOSIS — J309 Allergic rhinitis, unspecified: Secondary | ICD-10-CM

## 2015-11-17 DIAGNOSIS — H401133 Primary open-angle glaucoma, bilateral, severe stage: Secondary | ICD-10-CM | POA: Diagnosis not present

## 2015-11-17 DIAGNOSIS — H5503 Visual deprivation nystagmus: Secondary | ICD-10-CM | POA: Diagnosis not present

## 2015-11-17 DIAGNOSIS — E119 Type 2 diabetes mellitus without complications: Secondary | ICD-10-CM | POA: Diagnosis not present

## 2015-11-17 DIAGNOSIS — H59011 Keratopathy (bullous aphakic) following cataract surgery, right eye: Secondary | ICD-10-CM | POA: Diagnosis not present

## 2015-11-17 LAB — HM DIABETES EYE EXAM

## 2015-11-22 ENCOUNTER — Other Ambulatory Visit (HOSPITAL_COMMUNITY): Payer: Self-pay | Admitting: Psychiatry

## 2015-11-23 ENCOUNTER — Ambulatory Visit (INDEPENDENT_AMBULATORY_CARE_PROVIDER_SITE_OTHER): Payer: Commercial Managed Care - HMO | Admitting: *Deleted

## 2015-11-23 DIAGNOSIS — J309 Allergic rhinitis, unspecified: Secondary | ICD-10-CM

## 2015-11-24 ENCOUNTER — Telehealth: Payer: Self-pay | Admitting: Internal Medicine

## 2015-11-24 ENCOUNTER — Ambulatory Visit (INDEPENDENT_AMBULATORY_CARE_PROVIDER_SITE_OTHER): Payer: Commercial Managed Care - HMO

## 2015-11-24 DIAGNOSIS — J309 Allergic rhinitis, unspecified: Secondary | ICD-10-CM | POA: Diagnosis not present

## 2015-11-24 NOTE — Telephone Encounter (Signed)
Allergy Serum Extract Date Mixed: 11/24/2015 Vial: AB Strength: 1:10 Here/Mail/Pick Up: Here Mixed By: Desmond Dike, CMA Last OV: 07/12/2015 Pending OV: 01/10/2016

## 2015-11-30 ENCOUNTER — Ambulatory Visit (INDEPENDENT_AMBULATORY_CARE_PROVIDER_SITE_OTHER): Payer: Commercial Managed Care - HMO | Admitting: *Deleted

## 2015-11-30 DIAGNOSIS — J309 Allergic rhinitis, unspecified: Secondary | ICD-10-CM

## 2015-12-02 ENCOUNTER — Other Ambulatory Visit: Payer: Self-pay | Admitting: Family Medicine

## 2015-12-07 ENCOUNTER — Ambulatory Visit: Payer: Self-pay

## 2015-12-07 ENCOUNTER — Ambulatory Visit (INDEPENDENT_AMBULATORY_CARE_PROVIDER_SITE_OTHER): Payer: Commercial Managed Care - HMO | Admitting: *Deleted

## 2015-12-07 DIAGNOSIS — J309 Allergic rhinitis, unspecified: Secondary | ICD-10-CM

## 2015-12-13 ENCOUNTER — Encounter: Payer: Self-pay | Admitting: Family Medicine

## 2015-12-13 ENCOUNTER — Ambulatory Visit (INDEPENDENT_AMBULATORY_CARE_PROVIDER_SITE_OTHER): Payer: Commercial Managed Care - HMO | Admitting: Family Medicine

## 2015-12-13 VITALS — BP 122/50 | HR 84 | Temp 97.8°F | Ht 64.0 in | Wt 208.0 lb

## 2015-12-13 DIAGNOSIS — Z1211 Encounter for screening for malignant neoplasm of colon: Secondary | ICD-10-CM

## 2015-12-13 DIAGNOSIS — R269 Unspecified abnormalities of gait and mobility: Secondary | ICD-10-CM | POA: Diagnosis not present

## 2015-12-13 DIAGNOSIS — E1149 Type 2 diabetes mellitus with other diabetic neurological complication: Secondary | ICD-10-CM | POA: Diagnosis not present

## 2015-12-13 DIAGNOSIS — I10 Essential (primary) hypertension: Secondary | ICD-10-CM

## 2015-12-13 DIAGNOSIS — L603 Nail dystrophy: Secondary | ICD-10-CM | POA: Diagnosis not present

## 2015-12-13 MED ORDER — MONTELUKAST SODIUM 10 MG PO TABS: ORAL_TABLET | ORAL | Status: DC

## 2015-12-13 NOTE — Assessment & Plan Note (Signed)
Blood pressures well controlled and we'll continue current medications. The juxtaposition of several days in a row with her blood pressure low on her outside readings makes me think there was some environmental factor affecting her such as insufficient hydration. We discussed.

## 2015-12-13 NOTE — Progress Notes (Signed)
   Subjective:    Patient ID: [name] [name], female    DOB: Feb 13, 1951, 65 y.o.   MRN: [id number]  HPI #1. Diabetes mellitus: Had 2 hip injections and then had some elevated blood sugars. This lasted for about 4 days. They've now returned back to their normal range usually less than 200. #2. Hypertension: She still has intermittent dizziness. She brings her blood pressure readings the last few weeks. They're usually in the [id number] to [id number] systolic but there was a string of 2 or 3 days where she was in the 90 systolic. She says she's taking her medicines regularly. She does admit that she does not always drink plenty of fluid and/or eat regularly. #3. Time for her colonoscopy I need to fax her labs to her GI doctor. She also needs a referral for that. #4. Needs a prescription for prodigy glucometer stating that she is legally blind. #5. Stressors: She had her 48 year old son "disrespect her" and so she asked him to move out. He's currently living with her sister. He's trying to save money for an apartment says he spent his facets he gets it so she's not sure if he'll ever get an apartment. She continues to be in touch with him on an almost daily basis. Reports she will not ever live back in her home again. She feels safe in her home most of the time although she has had to get a couple of night lights. She did tell me about an episode where she had 2 ghosts visit her. She threw issue with them and they've not returned since.   Review of Systems No unusual weight change, no episodes of low blood sugar, no increased thirst. She does have some episodic dizziness as per history of present illness. Denies chest pain.    Objective:   Physical Exam  Vital signs reviewed. GENERAL: Well-developed, well-nourished, no acute distress. She does have some mild dizziness when she stands which resolves after about 30 seconds. CARDIOVASCULAR: Regular rate and rhythm no murmur gallop or rub LUNGS: Clear to  auscultation bilaterally, no rales or wheeze. ABDOMEN: Soft positive bowel sounds nontender nondistended. NEURO: Legally blind. Gait is mildly unsteady. She is using a cane today. Decreased soft touch sensation bilateral feet from about the ankle down, bilateral hands. On the palmar aspect. MSK: Movement of extremity x 4.        Assessment & Plan:

## 2015-12-13 NOTE — Assessment & Plan Note (Signed)
Last A1c was much better. I will send in her prescription for the prodigy glucometer. Continue current medication regimen. Foot exam today.

## 2015-12-13 NOTE — Assessment & Plan Note (Signed)
I trimmed her left great toenail as it was curved and long. She has some evidence of onychomycosis underneath that but is otherwise normal.

## 2015-12-13 NOTE — Assessment & Plan Note (Signed)
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 Monday she [place] the [place] bus, she can use her walker.

## 2015-12-14 ENCOUNTER — Ambulatory Visit (INDEPENDENT_AMBULATORY_CARE_PROVIDER_SITE_OTHER): Payer: Commercial Managed Care - HMO | Admitting: *Deleted

## 2015-12-14 DIAGNOSIS — J309 Allergic rhinitis, unspecified: Secondary | ICD-10-CM

## 2015-12-19 ENCOUNTER — Encounter: Payer: Self-pay | Admitting: Family Medicine

## 2015-12-21 ENCOUNTER — Ambulatory Visit (INDEPENDENT_AMBULATORY_CARE_PROVIDER_SITE_OTHER): Payer: Commercial Managed Care - HMO | Admitting: *Deleted

## 2015-12-21 ENCOUNTER — Ambulatory Visit (HOSPITAL_COMMUNITY): Payer: Self-pay | Admitting: Psychiatry

## 2015-12-21 DIAGNOSIS — J309 Allergic rhinitis, unspecified: Secondary | ICD-10-CM | POA: Diagnosis not present

## 2015-12-21 DIAGNOSIS — K5730 Diverticulosis of large intestine without perforation or abscess without bleeding: Secondary | ICD-10-CM | POA: Diagnosis not present

## 2015-12-21 DIAGNOSIS — K219 Gastro-esophageal reflux disease without esophagitis: Secondary | ICD-10-CM | POA: Diagnosis not present

## 2015-12-21 DIAGNOSIS — R748 Abnormal levels of other serum enzymes: Secondary | ICD-10-CM | POA: Diagnosis not present

## 2015-12-21 DIAGNOSIS — K5900 Constipation, unspecified: Secondary | ICD-10-CM | POA: Diagnosis not present

## 2015-12-22 ENCOUNTER — Encounter: Payer: Self-pay | Admitting: Internal Medicine

## 2015-12-27 ENCOUNTER — Telehealth: Payer: Self-pay | Admitting: Family Medicine

## 2015-12-27 NOTE — Telephone Encounter (Signed)
Need to speak with you regarding her iron tabs.  According to Dr. Collene Mares she need to stop taking.  Need to speak with you about this.

## 2015-12-27 NOTE — Telephone Encounter (Signed)
Patient informed. [name] [name] [name] [name], CMA  

## 2015-12-27 NOTE — Telephone Encounter (Signed)
Dear [name] Team Yes tell her she can stop taking it (the iron supplement) THANKS! [name]

## 2015-12-28 ENCOUNTER — Ambulatory Visit (INDEPENDENT_AMBULATORY_CARE_PROVIDER_SITE_OTHER): Payer: Commercial Managed Care - HMO | Admitting: *Deleted

## 2015-12-28 ENCOUNTER — Ambulatory Visit (INDEPENDENT_AMBULATORY_CARE_PROVIDER_SITE_OTHER): Payer: Commercial Managed Care - HMO | Admitting: Psychiatry

## 2015-12-28 ENCOUNTER — Encounter (HOSPITAL_COMMUNITY): Payer: Self-pay | Admitting: Psychiatry

## 2015-12-28 VITALS — BP 130/80 | HR 76 | Ht 61.0 in | Wt 209.6 lb

## 2015-12-28 DIAGNOSIS — F411 Generalized anxiety disorder: Secondary | ICD-10-CM | POA: Diagnosis not present

## 2015-12-28 DIAGNOSIS — J309 Allergic rhinitis, unspecified: Secondary | ICD-10-CM | POA: Diagnosis not present

## 2015-12-28 DIAGNOSIS — F331 Major depressive disorder, recurrent, moderate: Secondary | ICD-10-CM

## 2015-12-28 MED ORDER — SERTRALINE HCL 100 MG PO TABS: ORAL_TABLET | ORAL | Status: DC

## 2015-12-28 MED ORDER — BUPROPION HCL 100 MG PO TABS: 100.0000 mg | ORAL_TABLET | Freq: Two times a day (BID) | ORAL | Status: DC

## 2015-12-28 NOTE — Progress Notes (Signed)
Patient ID: [name] [name], female   DOB: Aug 04, 1951, 65 y.o.   MRN: [id number] Patient ID: [name] [name], female   DOB: May 09, 1951, 65 y.o.   MRN: [id number]  [place] [address] Progress Note  Shamaria Kavan [id number] 65 y.o.  12/28/2015 10:37 AM  Chief Complaint: Its been a good couple of months  History of Present Illness: Since her son moved out she has been doing really well. She feels relaxed now.   Pt continues to volunteer twice a week and really likes it. The rest of her time she goes to doctors appointments or doing something at home.   Reports about once a month or less she feels depressed due to stressors, lonliness and physical health. Pt has sad mood on those days. States she is unable to cry. Denies isolation, anhedonia, low motivation, worthlessness and hopelessness. Pt tries to keep herself busy.She often talks to friends on the phone. Denies AVH.   Sleep is fair and she uses her CPAP every night. Some nights it takes her hours to fall asleep. Reports one month ago randomly she would see figures above her bed. It has since resolved. Appetite is increased and she is eating a lot of sweets and carbs. Pt is eating 2 meals a day.  Energy remains low. Concentration is ok.   Anxiety is mild and tolerable. States she doesn't notice it much.    Pt is taking meds as prescribed and reports Wellbutrin may be causing constipation. States Zoloft makes her relaxed and sleepy.   Suicidal Ideation: No Plan Formed: No Patient has means to carry out plan: No  Homicidal Ideation: No Plan Formed: No Patient has means to carry out plan: No  Review of Systems: Psychiatric: Agitation: No Hallucination: No Depressed Mood: No Insomnia: No Hypersomnia: No Altered Concentration: No Feels Worthless: No Grandiose Ideas: No Belief In Special Powers: No New/Increased Substance Abuse: No Compulsions: No  Review of Systems  Constitutional: Negative for  fever, chills and weight loss.  HENT: Negative for ear pain and sore throat.   Eyes: Positive for blurred vision. Negative for double vision and pain.  Respiratory: Negative for cough, shortness of breath and wheezing.   Cardiovascular: Positive for leg swelling. Negative for chest pain and palpitations.  Gastrointestinal: Positive for constipation. Negative for heartburn, nausea, vomiting and abdominal pain.  Musculoskeletal: Positive for back pain, joint pain and neck pain.  Skin: Negative for itching and rash.  Neurological: Positive for sensory change. Negative for dizziness, tremors, seizures, loss of consciousness and headaches.  Psychiatric/Behavioral: Negative for depression, suicidal ideas, hallucinations and substance abuse. The patient is not nervous/anxious and does not have insomnia.      Past Medical Family, Social History: She was born and raised in [place]. Attended the blind school in [place]. Patient was born with bilateral congenital cataracts and had her first surgery at age 18 and her second surgery in 52. She finished high school and subsequently worked. She was never married and had her son at the age of 63 raised him as a single mother.Patient is on Social Security disability.  Social History   Social History  . Marital Status: Single    Spouse Name: N/A  . Number of Children: 1  . Years of Education: 12   Occupational History  . unemployed    Social History Main Topics  . Smoking status: Former Smoker -- 0.50 packs/day for .5 years    Types: Cigarettes    Quit date: 09/03/1975  .  Smokeless tobacco: Never Used  . Alcohol Use: No  . Drug Use: No  . Sexual Activity: Not Currently   Other Topics Concern  . None   Social History Narrative   Approved for section 8 housing (12/03).         Health Care POA:    Emergency Contact: sister, catherine Hackenberg [phone]   End of Life Plan:    Who lives with you: Has a son who may be autistic and stays  with her at times.   Any pets: none   Diet: Pt has a varied diet of protein, starch, and vegetables.   Exercise: Pt walks 1x a week with [place] [employer] [facility] [employer].   Seatbelts: Pt reports wearing seatbelt when in vehicles.    Hobbies: walking, tv, church, eating         Family History  Problem Relation Age of Onset  . Diabetes Mother   . Colon cancer Neg Hx   . Depression Neg Hx   . Anxiety disorder Neg Hx     Past Medical History  Diagnosis Date  . Chronic cough     Now followed by pulmonary  . Sleep apnea     on CPAP 12  . HTN (hypertension)   . Dyslipidemia   . Gout   . History of colonoscopy   . Concussion 11/18/11    "fell at dr's office; hit head"  . Complication of anesthesia     "just wake up coughing; that's all"  . Heart murmur   . Asthma   . COPD (chronic obstructive pulmonary disease) ([place])   . Diabetes mellitus type II     "take insulin & pills"  . Blood transfusion   . GERD (gastroesophageal reflux disease)   . Arthritis   . Chronic lower back pain   . Vocal cord paralysis, unilateral partial     "right"  . Depression   . Peripheral neuropathy ([place])   . Angina   . Pneumonia     "i've had it once" (11/18/11)  . Exertional dyspnea   . Headache(784.0) 11/18/11    "I've had mild headaches the last couple days"  . Headache(784.0) 01/08/12    "pretty constant since 11/18/11's concussion"  . Syncope 06/16/2012  . Allergy   . Anxiety   . Blood transfusion without reported diagnosis   . Cataract   . Glaucoma   . Myocardial infarction ([place])   . Osteoporosis     Outpatient Encounter Prescriptions as of 12/28/2015  Medication Sig  . ACCU-CHEK FASTCLIX LANCETS MISC TEST BLOOD SUGAR  TWO  TO THREE TIMES DAILY  . acyclovir (ZOVIRAX) 200 MG capsule Take 200 mg by mouth 2 (two) times daily.  [name] albuterol (PROVENTIL) (2.5 MG/3ML) 0.083% nebulizer solution Take 2.5 mg by nebulization every 6 (six) hours as needed for wheezing or shortness of  breath.  . allopurinol (ZYLOPRIM) 300 MG tablet TAKE 1 TABLET EVERY DAY  . ammonium lactate (LAC-HYDRIN) 12 % lotion Apply 1 application topically 2 (two) times daily as needed for dry skin.  [name] aspirin EC 81 MG tablet Take 81 mg by mouth daily.  [name] atorvastatin (LIPITOR) 40 MG tablet TAKE 1 TABLET EVERY DAY  . azelastine (ASTELIN) 0.1 % nasal spray Place 2 sprays into both nostrils daily. Use in each nostril as directed  . B-D ULTRAFINE III SHORT PEN 31G X 8 MM MISC USE THREE TIMES DAILY  . bisacodyl (DULCOLAX) 5 MG EC tablet Take 5 mg by  mouth daily as needed. Reported on 09/21/2015  . Blood Glucose Monitoring Suppl (ACCU-CHEK AVIVA PLUS) W/DEVICE KIT Use to check blood sugar 3-4 times daily  . brimonidine (ALPHAGAN P) 0.1 % SOLN Place 1 drop into the left eye 2 (two) times daily.   [name] buPROPion (WELLBUTRIN) 100 MG tablet Take 1 tablet (100 mg total) by mouth 2 (two) times daily.  [name] EPINEPHrine 0.3 mg/0.3 mL IJ SOAJ injection Inject 0.3 mLs (0.3 mg total) into the muscle once as needed (for allergic reaction).  [name] esomeprazole (NEXIUM) 40 MG capsule Take 1 capsule (40 mg total) by mouth daily.  . ferrous sulfate 325 (65 FE) MG tablet Take 325 mg by mouth daily.  . fluticasone (FLOVENT HFA) 110 MCG/ACT inhaler Inhale 2 puffs into the lungs daily.  . furosemide (LASIX) 40 MG tablet TAKE 1 TABLET EVERY DAY  . glucose blood test strip Test blood sugar 3-4 times daily  . LANTUS SOLOSTAR 100 UNIT/ML Solostar Pen INJECT  65 UNITS SUBCUTANEOUSLY EVERY EVENING  . latanoprost (XALATAN) 0.005 % ophthalmic solution Place 1 drop into both eyes at bedtime.  [name] linaclotide (LINZESS) 145 MCG CAPS capsule Take 145 mcg by mouth daily before breakfast.  . losartan (COZAAR) 100 MG tablet TAKE 1 TABLET EVERY DAY (Patient taking differently: TAKE 1 TABLET BY MOUTH  EVERY DAY)  . metoprolol succinate (TOPROL-XL) 50 MG 24 hr tablet Take 3 tablets (150 mg total) by mouth daily. Take with or immediately following a meal.  .  montelukast (SINGULAIR) 10 MG tablet Take one by mouth daily prn allergies or asthma  . Multiple Vitamins-Minerals (MULTIVITAMIN PO) Take 1 tablet by mouth daily.  . nitroGLYCERIN (NITROSTAT) 0.4 MG SL tablet Place 0.4 mg under the tongue every 5 (five) minutes as needed for chest pain.  . NONFORMULARY OR COMPOUNDED ITEM Allergy Vaccine 1:10 Given at [facility]  . NOVOLOG FLEXPEN 100 UNIT/ML FlexPen INJECT 30 UNITS SUBCUTANEOUSLY WITH THE EARLY MEAL AND 30 UNITS WITH THE LATE MEAL  . salicylic acid 17 % gel Apply topically 2 (two) times daily. Apply small amount to callous area on bottom of foot BID  . sertraline (ZOLOFT) 100 MG tablet TAKE 1 AND 1/2 TABLETS AT BEDTIME  . spironolactone (ALDACTONE) 25 MG tablet TAKE 1 TABLET (25 MG TOTAL) BY MOUTH DAILY BEFORE SUPPER.  . traMADol (ULTRAM) 50 MG tablet Take 2 tablets (100 mg total) by mouth every 12 (twelve) hours as needed.  . TRULICITY 4.82 NO/0.3BC SOPN INJECT  0.75MG (0.5ML) SUBCUTANEOUSLY ONE TIME WEEKLY  . VENTOLIN HFA 108 (90 Base) MCG/ACT inhaler INHALE 2 PUFFS INTO THE LUNGS EVERY 6  HOURS AS NEEDED FOR WHEEZING OR SHORTNESS OF BREATH.  [name] Vitamin D, Ergocalciferol, (DRISDOL) 50000 UNITS CAPS capsule Take 1 capsule (50,000 Units total) by mouth every 30 (thirty) days. Take on 3rd of the month   No facility-administered encounter medications on file as of 12/28/2015.    Past Psychiatric History/Hospitalization(s): Anxiety: Yes Bipolar Disorder: No Depression: Yes Mania: No Psychosis: No Schizophrenia: No Personality Disorder: No Hospitalization for psychiatric illness: No History of Electroconvulsive Shock Therapy: No Prior Suicide Attempts: No  Physical Exam: Constitutional:  BP 130/80 mmHg  Pulse 76  Ht 5' 1"  (1.549 m)  Wt 209 lb 9.6 oz (95.074 kg)  BMI 39.62 kg/m2  General Appearance: alert, oriented, no acute distress  Musculoskeletal: Strength & Muscle Tone: decreased and walks with cane Gait & Station:  unsteady Patient leans: Front  Mental Status Examination/Evaluation: Objective: Attitude: Calm and  cooperative  Appearance: Casual, appears to be stated age  Eye Contact::  Good has glaucoma and difficulty seeing despite using her glasses  Speech:  Clear and Coherent and Normal Rate  Volume:  Normal  Mood:  euthymic  Affect:  Full Range  Thought Process:  Goal Directed  Orientation:  Full (Time, Place, and Person)  Thought Content:  Negative  Suicidal Thoughts:  No  Homicidal Thoughts:  No  Judgement:  Good  Insight:  Good  Concentration: good  Memory: Immediate-good Recent-good Remote-good  Recall: fair  Language: fair  Gait and Station: normal  [employer] of Knowledge: average  Psychomotor Activity:  Normal  Akathisia:  No  Handed:  Right  AIMS (if indicated): n/a   Assets:  Communication Skills Desire for Improvement Housing Leisure Time Resilience Social Support       Medical Decision Making (Choose Three): Established Problem, Stable/Improving (1), Review of Psycho-Social Stressors (1) and Review of Medication Regimen & Side Effects (2)  Assessment: MDD- recurrent, moderate; GAD  #1 Maj. depression recurrent chronic moderate. Wellbutrin  241m BID by mouth every morning. For constipation recommend fiber or laxative.   Zoloft 150 mg by mouth every a.m. #2 generalized anxiety disorder. Wellbutrin and Zoloft Also discussed relaxations and deep breathing techniques with her and patient has agreed to practice them at home. States at some point she would like to get off Zoloft and Wellbutrin in the future.  #3 Multiple medical problems- Will be treated by her PCP Dr. SMallie Mussel #4 nutrition- Discussed the importance of nutrition and psychoeducation regarding nutrition given her multiple health problems was discussed with her. Patient stated understanding and also encouraged her to walk a little more so that she moves.  Medication management with supportive  therapy. Risks/benefits and SE of the medication discussed. Pt verbalized understanding and verbal consent obtained for treatment.  Affirm with the patient that the medications are taken as ordered. Patient expressed understanding of how their medications were to be used.   Therapy: brief supportive therapy provided. Discussed psychosocial stressors in detail.    Labs: reviewed 03/20/2015 glue 277, HbA1c on 04/12/2015 was 8.6  Pt denies SI and is at an acute low risk for suicide.Patient told to call clinic if any problems occur. Patient advised to go to ER if they should develop SI/HI, side effects, or if symptoms worsen. Has crisis numbers to call if needed. Pt verbalized understanding.  F/up in 3 months or sooner if needed   SCharlcie Cradle MD 12/28/2015

## 2016-01-04 ENCOUNTER — Ambulatory Visit (INDEPENDENT_AMBULATORY_CARE_PROVIDER_SITE_OTHER): Payer: Commercial Managed Care - HMO

## 2016-01-04 ENCOUNTER — Ambulatory Visit: Payer: Self-pay

## 2016-01-04 DIAGNOSIS — J309 Allergic rhinitis, unspecified: Secondary | ICD-10-CM | POA: Diagnosis not present

## 2016-01-05 ENCOUNTER — Other Ambulatory Visit: Payer: Self-pay | Admitting: Family Medicine

## 2016-01-05 ENCOUNTER — Ambulatory Visit (INDEPENDENT_AMBULATORY_CARE_PROVIDER_SITE_OTHER): Payer: Commercial Managed Care - HMO | Admitting: Pharmacist

## 2016-01-05 ENCOUNTER — Encounter: Payer: Self-pay | Admitting: Pharmacist

## 2016-01-05 VITALS — BP 119/62 | HR 74 | Wt 208.4 lb

## 2016-01-05 DIAGNOSIS — E1149 Type 2 diabetes mellitus with other diabetic neurological complication: Secondary | ICD-10-CM | POA: Diagnosis not present

## 2016-01-05 DIAGNOSIS — R079 Chest pain, unspecified: Secondary | ICD-10-CM | POA: Diagnosis not present

## 2016-01-05 DIAGNOSIS — I10 Essential (primary) hypertension: Secondary | ICD-10-CM

## 2016-01-05 DIAGNOSIS — Z91038 Other insect allergy status: Secondary | ICD-10-CM

## 2016-01-05 DIAGNOSIS — Z91030 Bee allergy status: Secondary | ICD-10-CM

## 2016-01-05 MED ORDER — LOSARTAN POTASSIUM 50 MG PO TABS: 50.0000 mg | ORAL_TABLET | Freq: Every day | ORAL | Status: DC

## 2016-01-05 MED ORDER — NITROGLYCERIN 0.4 MG SL SUBL: 0.4000 mg | SUBLINGUAL_TABLET | SUBLINGUAL | Status: DC | PRN

## 2016-01-05 MED ORDER — INSULIN GLARGINE 100 UNIT/ML SOLOSTAR PEN: 60.0000 [IU] | PEN_INJECTOR | Freq: Every day | SUBCUTANEOUS | Status: DC

## 2016-01-05 MED ORDER — DULAGLUTIDE 1.5 MG/0.5ML ~~LOC~~ SOAJ: 1.5000 mg | Freq: Every day | SUBCUTANEOUS | Status: DC

## 2016-01-05 MED ORDER — EPINEPHRINE 0.3 MG/0.3ML IJ SOAJ: 0.3000 mg | Freq: Once | INTRAMUSCULAR | Status: DC | PRN

## 2016-01-05 NOTE — Assessment & Plan Note (Signed)
Diabetes longstanding currently pretty well controlled. Patient reports that she has experienced a few symptoms of being low (jittery and sluggish) and is able to verbalize appropriate hypoglycemia management plan.  Counseled patient to decrease Lantus from 65 units in the evening to 60 units in the evening. Continue same dose ([id number] mg) Trulicity until current supply is used up. Then the dose will increase to 1.5mg  weekly. At that time decrease Lantus dose to 50 units in the evening.  Patient reports adherence with medications.

## 2016-01-05 NOTE — Patient Instructions (Addendum)
Thank you for coming in today! It was so nice to see you.  Decrease Lantus from 65 units in the evening to 60 units in the evening. Continue same dose ([id number] mg) Trulicity until current supply is used up. Then the dose will increase to 1.5mg  weekly. At that time decrease Lantus dose to 50 units in the evening.  Decrease losartan from 100mg  daily to 50mg  daily.  Refilled EPI pen and nitroglycerin today.  Follow-up with Dr. Nori Riis in June and then with Dr. Valentina Lucks at the beginning of July.

## 2016-01-05 NOTE — Assessment & Plan Note (Signed)
Hypertension longstanding and patient reports feeling sluggish at times. Per her BP log she has been on the lower side of goal. Counseled patient to decrease losartan from 100mg  daily to 50mg  daily. Patient reports adherence with medication.  Continue Metoprolol and spironolactone at low doses.   Reevaluate at next visit.

## 2016-01-05 NOTE — Progress Notes (Signed)
S:    Patient arrives in good spirits ambulating well.  Presents for diabetes follow-up. Patient was last seen by Primary Care Provider on 12/13/15.   Patient reports Diabetes was diagnosed in 2012.   Patient reports adherence with medications.  Current diabetes medications include: Trulicity 0.75mg  SQ inj once weekly on Thursdays, Novolog (insulin aspart) 30 units with early meal and 30 units with late meal, Lantus solostar pen 65 units SQ in evening. Current hypertension medications include:   Patient denies hypoglycemic events. Although does report feeling low at times.   Patient denies nocturia.  Patient denies neuropathy. Patient denies visual changes. Patient denies self foot exams.   O:  Lab Results  Component Value Date   HGBA1C 7.3 11/10/2015   There were no vitals filed for this visit.  Home CBG range: 80-180  A/P: Diabetes longstanding currently pretty well controlled. Patient reports that she has experienced a few symptoms of being low (jittery and sluggish) and is able to verbalize appropriate hypoglycemia management plan.  Counseled patient to decrease Lantus from 65 units in the evening to 60 units in the evening. Continue same dose ([id number] mg) Trulicity until current supply is used up. Then the dose will increase to 1.5mg  weekly. At that time decrease Lantus dose to 50 units in the evening.  Patient reports adherence with medications.  Next A1C anticipated 1-2 months.    ASCVD risk greater than 7.5%. Continued Aspirin 81 mg and Continued atorvastatin 40 mg.   Hypertension longstanding and patient reports feeling sluggish at times. Per her BP log she has been on the lower side of goal. Counseled patient to decrease losartan from 100mg  daily to 50mg  daily. Patient reports adherence with medication.  Refilled NTG an Epi-pen today.    Written patient instructions provided.  Total time in face to face counseling 45 minutes.   Follow up in Pharmacist Clinic Visit in July.    Patient seen with Zettie Cooley, PharmD Candidate and Cruz Condon, PharmD Resident.

## 2016-01-08 NOTE — Progress Notes (Signed)
Patient ID: [name] [name], female   DOB: 12/18/1950, 65 y.o.   MRN: [id number] Reviewed: Agree with Dr. Graylin Shiver documentation and management.

## 2016-01-09 ENCOUNTER — Other Ambulatory Visit: Payer: Self-pay | Admitting: Family Medicine

## 2016-01-09 MED ORDER — DULAGLUTIDE 0.75 MG/0.5ML ~~LOC~~ SOAJ: 1.5000 mg | SUBCUTANEOUS | Status: DC

## 2016-01-10 ENCOUNTER — Emergency Department (HOSPITAL_COMMUNITY)
Admission: EM | Admit: 2016-01-10 | Discharge: 2016-01-10 | Disposition: A | Discharge status: Home / Self Care | Payer: Commercial Managed Care - HMO | Attending: Emergency Medicine | Admitting: Emergency Medicine

## 2016-01-10 ENCOUNTER — Ambulatory Visit (INDEPENDENT_AMBULATORY_CARE_PROVIDER_SITE_OTHER): Payer: Commercial Managed Care - HMO

## 2016-01-10 ENCOUNTER — Encounter (HOSPITAL_COMMUNITY): Payer: Self-pay | Admitting: Nurse Practitioner

## 2016-01-10 ENCOUNTER — Emergency Department (HOSPITAL_COMMUNITY): Payer: Commercial Managed Care - HMO

## 2016-01-10 ENCOUNTER — Encounter (HOSPITAL_COMMUNITY): Payer: Self-pay | Admitting: Emergency Medicine

## 2016-01-10 ENCOUNTER — Ambulatory Visit: Payer: Self-pay | Admitting: Internal Medicine

## 2016-01-10 DIAGNOSIS — F329 Major depressive disorder, single episode, unspecified: Secondary | ICD-10-CM | POA: Insufficient documentation

## 2016-01-10 DIAGNOSIS — Z9981 Dependence on supplemental oxygen: Secondary | ICD-10-CM | POA: Insufficient documentation

## 2016-01-10 DIAGNOSIS — H409 Unspecified glaucoma: Secondary | ICD-10-CM | POA: Diagnosis not present

## 2016-01-10 DIAGNOSIS — Z79899 Other long term (current) drug therapy: Secondary | ICD-10-CM | POA: Diagnosis not present

## 2016-01-10 DIAGNOSIS — I252 Old myocardial infarction: Secondary | ICD-10-CM | POA: Diagnosis not present

## 2016-01-10 DIAGNOSIS — S62102A Fracture of unspecified carpal bone, left wrist, initial encounter for closed fracture: Secondary | ICD-10-CM

## 2016-01-10 DIAGNOSIS — I10 Essential (primary) hypertension: Secondary | ICD-10-CM | POA: Insufficient documentation

## 2016-01-10 DIAGNOSIS — J449 Chronic obstructive pulmonary disease, unspecified: Secondary | ICD-10-CM | POA: Diagnosis not present

## 2016-01-10 DIAGNOSIS — R519 Headache, unspecified: Secondary | ICD-10-CM

## 2016-01-10 DIAGNOSIS — S6992XA Unspecified injury of left wrist, hand and finger(s), initial encounter: Secondary | ICD-10-CM | POA: Diagnosis present

## 2016-01-10 DIAGNOSIS — Z7982 Long term (current) use of aspirin: Secondary | ICD-10-CM | POA: Insufficient documentation

## 2016-01-10 DIAGNOSIS — Y9389 Activity, other specified: Secondary | ICD-10-CM | POA: Insufficient documentation

## 2016-01-10 DIAGNOSIS — F419 Anxiety disorder, unspecified: Secondary | ICD-10-CM | POA: Insufficient documentation

## 2016-01-10 DIAGNOSIS — Y9289 Other specified places as the place of occurrence of the external cause: Secondary | ICD-10-CM | POA: Insufficient documentation

## 2016-01-10 DIAGNOSIS — Z7951 Long term (current) use of inhaled steroids: Secondary | ICD-10-CM | POA: Insufficient documentation

## 2016-01-10 DIAGNOSIS — S52572A Other intraarticular fracture of lower end of left radius, initial encounter for closed fracture: Secondary | ICD-10-CM | POA: Insufficient documentation

## 2016-01-10 DIAGNOSIS — E119 Type 2 diabetes mellitus without complications: Secondary | ICD-10-CM | POA: Diagnosis not present

## 2016-01-10 DIAGNOSIS — M1990 Unspecified osteoarthritis, unspecified site: Secondary | ICD-10-CM | POA: Diagnosis not present

## 2016-01-10 DIAGNOSIS — G4730 Sleep apnea, unspecified: Secondary | ICD-10-CM | POA: Diagnosis not present

## 2016-01-10 DIAGNOSIS — E785 Hyperlipidemia, unspecified: Secondary | ICD-10-CM | POA: Diagnosis not present

## 2016-01-10 DIAGNOSIS — Y998 Other external cause status: Secondary | ICD-10-CM | POA: Diagnosis not present

## 2016-01-10 DIAGNOSIS — R011 Cardiac murmur, unspecified: Secondary | ICD-10-CM | POA: Diagnosis not present

## 2016-01-10 DIAGNOSIS — E1165 Type 2 diabetes mellitus with hyperglycemia: Secondary | ICD-10-CM | POA: Diagnosis not present

## 2016-01-10 DIAGNOSIS — Z79891 Long term (current) use of opiate analgesic: Secondary | ICD-10-CM | POA: Diagnosis not present

## 2016-01-10 DIAGNOSIS — R51 Headache: Secondary | ICD-10-CM | POA: Diagnosis not present

## 2016-01-10 DIAGNOSIS — M810 Age-related osteoporosis without current pathological fracture: Secondary | ICD-10-CM | POA: Insufficient documentation

## 2016-01-10 DIAGNOSIS — Z794 Long term (current) use of insulin: Secondary | ICD-10-CM | POA: Insufficient documentation

## 2016-01-10 DIAGNOSIS — E1140 Type 2 diabetes mellitus with diabetic neuropathy, unspecified: Secondary | ICD-10-CM | POA: Diagnosis not present

## 2016-01-10 DIAGNOSIS — Z87891 Personal history of nicotine dependence: Secondary | ICD-10-CM | POA: Diagnosis not present

## 2016-01-10 DIAGNOSIS — Z9889 Other specified postprocedural states: Secondary | ICD-10-CM | POA: Diagnosis not present

## 2016-01-10 DIAGNOSIS — K219 Gastro-esophageal reflux disease without esophagitis: Secondary | ICD-10-CM | POA: Insufficient documentation

## 2016-01-10 DIAGNOSIS — M109 Gout, unspecified: Secondary | ICD-10-CM | POA: Diagnosis not present

## 2016-01-10 DIAGNOSIS — W1839XA Other fall on same level, initial encounter: Secondary | ICD-10-CM | POA: Diagnosis not present

## 2016-01-10 DIAGNOSIS — Z8701 Personal history of pneumonia (recurrent): Secondary | ICD-10-CM | POA: Insufficient documentation

## 2016-01-10 DIAGNOSIS — G8929 Other chronic pain: Secondary | ICD-10-CM | POA: Diagnosis not present

## 2016-01-10 DIAGNOSIS — R739 Hyperglycemia, unspecified: Secondary | ICD-10-CM

## 2016-01-10 DIAGNOSIS — J309 Allergic rhinitis, unspecified: Secondary | ICD-10-CM | POA: Diagnosis not present

## 2016-01-10 LAB — CBG MONITORING, ED: Glucose-Capillary: 209 mg/dL — ABNORMAL HIGH (ref 65–99)

## 2016-01-10 MED ORDER — HYDROCODONE-ACETAMINOPHEN 5-325 MG PO TABS: 1.0000 | ORAL_TABLET | Freq: Four times a day (QID) | ORAL | Status: DC | PRN

## 2016-01-10 MED ORDER — TRAMADOL HCL 50 MG PO TABS: 50.0000 mg | ORAL_TABLET | Freq: Four times a day (QID) | ORAL | Status: DC | PRN

## 2016-01-10 MED ORDER — KETOROLAC TROMETHAMINE 30 MG/ML IJ SOLN
30.0000 mg | Freq: Once | INTRAMUSCULAR | Status: AC
  Administered 2016-01-10: 30 mg via INTRAVENOUS
  Filled 2016-01-10: qty 1

## 2016-01-10 MED ORDER — TRAMADOL HCL 50 MG PO TABS
50.0000 mg | ORAL_TABLET | Freq: Once | ORAL | Status: AC
  Administered 2016-01-10: 50 mg via ORAL
  Filled 2016-01-10: qty 1

## 2016-01-10 MED ORDER — DIPHENHYDRAMINE HCL 50 MG/ML IJ SOLN
25.0000 mg | Freq: Once | INTRAMUSCULAR | Status: AC
  Administered 2016-01-10: 25 mg via INTRAVENOUS
  Filled 2016-01-10: qty 1

## 2016-01-10 MED ORDER — METOCLOPRAMIDE HCL 5 MG/ML IJ SOLN
10.0000 mg | Freq: Once | INTRAMUSCULAR | Status: AC
  Administered 2016-01-10: 10 mg via INTRAVENOUS
  Filled 2016-01-10: qty 2

## 2016-01-10 NOTE — ED Notes (Signed)
Patient arrives to [facility] after being recently discharged. She was ambulating to the bus stop and became dizzy and fell. She complains of left knee and left arm pain but feels they are mild. She does complain of feeling dizzy/lightheaded. She uses a cane to ambulate at baseline.

## 2016-01-10 NOTE — ED Provider Notes (Signed)
CSN: [id number]     Arrival date & time 01/10/16  1339 History   First MD Initiated Contact with Patient 01/10/16 1515     Chief Complaint  Patient presents with  . Headache     (Consider location/radiation/quality/duration/timing/severity/associated sxs/prior Treatment) HPI Comments: Patient presents today with a chief complaint of a headache.  She reports gradual onset of a frontal headache approximately 2 hours prior to arrival.  Headache has been constant since that time.  She reports a history of Migraines and states that she has had similar headaches to this in the past.  She denies any acute injury or trauma.  She describes the headache as a pressure and states that it does not feel sharp.  She has not taken any medications prior to arrival.  She reports associated dizziness.  She states that she is legally blind and denies any vision changes.  She denies fever, chills, neck pain/stiffness, focal weakness, difficulty speaking, difficulty swallowing, or syncope.    Patient is a 65 y.o. female presenting with headaches. The history is provided by the patient.  Headache   Past Medical History  Diagnosis Date  . Chronic cough     Now followed by pulmonary  . Sleep apnea     on CPAP 12  . HTN (hypertension)   . Dyslipidemia   . Gout   . History of colonoscopy   . Concussion 11/18/11    "fell at dr's office; hit head"  . Complication of anesthesia     "just wake up coughing; that's all"  . Heart murmur   . Asthma   . COPD (chronic obstructive pulmonary disease) ([place])   . Diabetes mellitus type II     "take insulin & pills"  . Blood transfusion   . GERD (gastroesophageal reflux disease)   . Arthritis   . Chronic lower back pain   . Vocal cord paralysis, unilateral partial     "right"  . Depression   . Peripheral neuropathy ([place])   . Angina   . Pneumonia     "i've had it once" (11/18/11)  . Exertional dyspnea   . Headache(784.0) 11/18/11    "I've had mild headaches the  last couple days"  . Headache(784.0) 01/08/12    "pretty constant since 11/18/11's concussion"  . Syncope 06/16/2012  . Allergy   . Anxiety   . Blood transfusion without reported diagnosis   . Cataract   . Glaucoma   . Myocardial infarction ([place])   . Osteoporosis    Past Surgical History  Procedure Laterality Date  . Colonoscopy w/ biopsies  11/21/2010    adenoma polyp--no hi grade dysplasia Dr Collene Mares  . Treatment fistula anal    . Breast reduction surgery  04-21-2003  . Tubal ligation  1987  . Cardiac catheterization  03-02-2004  . Bronchoscopy  07-2007  . Knee arthroscopy  08/2000    right  . Shoulder arthroscopy  08/2000    right  . T-score  09/02/04    -0.54 (low risk currently)  . Total abdominal hysterectomy  10-07-2000  . Cesarean section  1987  . Shoulder arthroscopy w/ rotator cuff repair  10/18/2003    left  . Cataract extraction  1955; 1979    bilateral; right eye  . Eye surgery    . Breast biopsy      bilaterally  . Breast cyst excision      right twice; left once  . Carpal tunnel release      left  .  Anterior cervical decomp/discectomy fusion  01/15/2012    Procedure: ANTERIOR CERVICAL DECOMPRESSION/DISCECTOMY FUSION 3 LEVELS;  Surgeon: Eustace Moore, MD;  Location: [facility];  Service: Neurosurgery;  Laterality: N/A;  Anterior Cervical Decompression Discectomy Fusion Cerivcal three four, cervical four five, cervical five six, cervical six seven  . Left and right heart catheterization with coronary angiogram N/A 01/19/2014    Procedure: LEFT AND RIGHT HEART CATHETERIZATION WITH CORONARY ANGIOGRAM;  Surgeon: Sinclair Grooms, MD;  Location: [facility] CATH LAB;  Service: Cardiovascular;  Laterality: N/A;  . Spine surgery     Family History  Problem Relation Age of Onset  . Diabetes Mother   . Colon cancer Neg Hx   . Depression Neg Hx   . Anxiety disorder Neg Hx    Social History  Substance Use Topics  . Smoking status: Former Smoker -- 0.50 packs/day for .5 years     Types: Cigarettes    Quit date: 09/03/1975  . Smokeless tobacco: Never Used  . Alcohol Use: No   OB History    No data available     Review of Systems  Neurological: Positive for headaches.  All other systems reviewed and are negative.     Allergies  Bee venom; Propoxyphene hcl; Amlodipine besylate; Hydrocodone; Lisinopril; Metformin and related; Metronidazole; and Valsartan  Home Medications   Prior to Admission medications   Medication Sig Start Date End Date Taking? Authorizing Provider  ACCU-CHEK FASTCLIX LANCETS MISC TEST BLOOD SUGAR  TWO  TO THREE TIMES DAILY 10/25/15   Dickie La, MD  acyclovir (ZOVIRAX) 200 MG capsule Take 200 mg by mouth 2 (two) times daily.    Historical Provider, MD  allopurinol (ZYLOPRIM) 300 MG tablet TAKE 1 TABLET EVERY DAY 10/25/15   Dickie La, MD  ammonium lactate (LAC-HYDRIN) 12 % lotion Apply 1 application topically 2 (two) times daily as needed for dry skin.    Historical Provider, MD  aspirin EC 81 MG tablet Take 81 mg by mouth daily.    Historical Provider, MD  atorvastatin (LIPITOR) 40 MG tablet TAKE 1 TABLET EVERY DAY 12/04/15   Dickie La, MD  azelastine (ASTELIN) 0.1 % nasal spray Place 2 sprays into both nostrils daily. Use in each nostril as directed    Historical Provider, MD  B-D ULTRAFINE III SHORT PEN 31G X 8 MM MISC USE THREE TIMES DAILY 05/19/15   Dickie La, MD  Blood Glucose Monitoring Suppl (ACCU-CHEK AVIVA PLUS) W/DEVICE KIT Use to check blood sugar 3-4 times daily 10/19/14   Zenia Resides, MD  brimonidine (ALPHAGAN P) 0.1 % SOLN Place 1 drop into the left eye 2 (two) times daily.     Historical Provider, MD  buPROPion (WELLBUTRIN) 100 MG tablet Take 1 tablet (100 mg total) by mouth 2 (two) times daily. 12/28/15 12/27/16  Charlcie Cradle, MD  Dulaglutide (TRULICITY) 8.54 OE/7.0JJ SOPN Inject 1.5 mg into the skin once a week. 01/09/16   Zenia Resides, MD  EPINEPHrine 0.3 mg/0.3 mL IJ SOAJ injection Inject 0.3 mLs (0.3 mg total)  into the muscle once as needed (for allergic reaction). 01/05/16   Zenia Resides, MD  esomeprazole (NEXIUM) 40 MG capsule Take 1 capsule (40 mg total) by mouth daily. 10/04/14   Dickie La, MD  fluticasone (FLOVENT HFA) 110 MCG/ACT inhaler Inhale 2 puffs into the lungs daily. 09/20/15   Dickie La, MD  furosemide (LASIX) 40 MG tablet TAKE 1 TABLET EVERY DAY 10/25/15  Dickie La, MD  glucose blood test strip Test blood sugar 3-4 times daily 04/13/15   Dickie La, MD  Insulin Glargine (LANTUS SOLOSTAR) 100 UNIT/ML Solostar Pen Inject 60 Units into the skin daily at 10 pm. The decrease to 50 units when new dose of Trulicity comes in. 02/04/32   Zenia Resides, MD  latanoprost (XALATAN) 0.005 % ophthalmic solution Place 1 drop into both eyes at bedtime.    Historical Provider, MD  linaclotide (LINZESS) 145 MCG CAPS capsule Take 145 mcg by mouth daily before breakfast.    Historical Provider, MD  losartan (COZAAR) 100 MG tablet Take 0.5 tablets (50 mg total) by mouth daily. 01/05/16   Zenia Resides, MD  losartan (COZAAR) 50 MG tablet Take 1 tablet (50 mg total) by mouth daily. 01/05/16   Zenia Resides, MD  metoprolol succinate (TOPROL-XL) 50 MG 24 hr tablet Take 3 tablets (150 mg total) by mouth daily. Take with or immediately following a meal. 08/29/15   Zenia Resides, MD  montelukast (SINGULAIR) 10 MG tablet Take one by mouth daily prn allergies or asthma 12/13/15   Dickie La, MD  Multiple Vitamins-Minerals (MULTIVITAMIN PO) Take 1 tablet by mouth daily.    Historical Provider, MD  nitroGLYCERIN (NITROSTAT) 0.4 MG SL tablet Place 0.4 mg under the tongue every 5 (five) minutes as needed for chest pain.    Historical Provider, MD  nitroGLYCERIN (NITROSTAT) 0.4 MG SL tablet Place 1 tablet (0.4 mg total) under the tongue every 5 (five) minutes as needed for chest pain. 01/05/16   Zenia Resides, MD  NONFORMULARY OR COMPOUNDED ITEM Allergy Vaccine 1:10 Given at [facility]    Historical  Provider, MD  NOVOLOG FLEXPEN 100 UNIT/ML FlexPen INJECT 30 UNITS SUBCUTANEOUSLY WITH THE EARLY MEAL AND 30 UNITS WITH THE LATE MEAL 02/23/15   Dickie La, MD  salicylic acid 17 % gel Apply topically 2 (two) times daily. Apply small amount to callous area on bottom of foot BID 02/10/14   Josalyn Funches, MD  sertraline (ZOLOFT) 100 MG tablet TAKE 1 AND 1/2 TABLETS AT BEDTIME 12/28/15   Charlcie Cradle, MD  spironolactone (ALDACTONE) 25 MG tablet TAKE 1 TABLET (25 MG TOTAL) BY MOUTH DAILY BEFORE SUPPER. 08/23/15   Dickie La, MD  traMADol (ULTRAM) 50 MG tablet Take 2 tablets (100 mg total) by mouth every 12 (twelve) hours as needed. 07/07/15   Dickie La, MD  VENTOLIN HFA 108 (90 Base) MCG/ACT inhaler INHALE 2 PUFFS INTO THE LUNGS EVERY 6  HOURS AS NEEDED FOR WHEEZING OR SHORTNESS OF BREATH. 09/12/15   Dickie La, MD  Vitamin D, Ergocalciferol, (DRISDOL) 50000 UNITS CAPS capsule Take 1 capsule (50,000 Units total) by mouth every 30 (thirty) days. Take on 3rd of the month 07/18/14   Zenia Resides, MD   BP 124/56 mmHg  Pulse 80  Temp(Src) 98 F (36.7 C) (Oral)  Resp 15  Ht 5' 1"  (1.549 m)  Wt 94.348 kg  BMI 39.32 kg/m2  SpO2 95% Physical Exam  Constitutional: She appears well-developed and well-nourished.  HENT:  Head: Normocephalic and atraumatic.  Eyes: EOM are normal.  Patient legally blind  Neck: Normal range of motion. Neck supple.  Cardiovascular: Normal rate, regular rhythm and normal heart sounds.   Pulmonary/Chest: Effort normal and breath sounds normal.  Musculoskeletal: Normal range of motion.  Neurological: She is alert. No cranial nerve deficit or sensory deficit.  Patient ambulating with a  walker, which she uses at baseline.  No ataxia.  Muscle strength LE 4/5 bilaterally Muscle strength UE 4/5 bilaterally   Skin: Skin is warm and dry.  Psychiatric: She has a normal mood and affect.  Nursing note and vitals reviewed.   ED Course  Procedures (including critical care  time) Labs Review Labs Reviewed  CBG MONITORING, ED - Abnormal; Notable for the following:    Glucose-Capillary 209 (*)    All other components within normal limits    Imaging Review No results found. I have personally reviewed and evaluated these images and lab results as part of my medical decision-making.   EKG Interpretation None     4:39 PM Reassessed patient.  She reports improvement in her headache, but states that the headache is still present slightly.   MDM   Final diagnoses:  None   Patient presents today with a frontal headache.  She states that she has a history of Migraine Headaches and that this headache feels similar.  She denies acute head injury or trauma.  She is afebrile.  No nuchal rigidity.  She reports that the headache was gradual in onset.  Neurological exam is nonfocal.  Patient able to ambulate with a walker.  She ambulates with a walker or cane at baseline.  Headache improved in the ED.  Feel that the patient is stable for discharge.  Return precautions given.    Hyman Bible, PA-C 01/11/16 2137  Leonard Schwartz, MD 01/15/16 [phone]

## 2016-01-10 NOTE — ED Provider Notes (Signed)
CSN: [id number]     Arrival date & time 01/10/16  1751 History   First MD Initiated Contact with Patient 01/10/16 1754     Chief Complaint  Patient presents with  . Fall     (Consider location/radiation/quality/duration/timing/severity/associated sxs/prior Treatment) Patient is a 65 y.o. female presenting with fall. The history is provided by the patient (Patient states she tripped and fell on her left wrist patient has pain in that wrist).  Fall This is a new problem. The current episode started less than 1 hour ago. The problem occurs rarely. The problem has been resolved. Pertinent negatives include no chest pain, no abdominal pain and no headaches. Exacerbated by: Movement of rest.    Past Medical History  Diagnosis Date  . Chronic cough     Now followed by pulmonary  . Sleep apnea     on CPAP 12  . HTN (hypertension)   . Dyslipidemia   . Gout   . History of colonoscopy   . Concussion 11/18/11    "fell at dr's office; hit head"  . Complication of anesthesia     "just wake up coughing; that's all"  . Heart murmur   . Asthma   . COPD (chronic obstructive pulmonary disease) ([place])   . Diabetes mellitus type II     "take insulin & pills"  . Blood transfusion   . GERD (gastroesophageal reflux disease)   . Arthritis   . Chronic lower back pain   . Vocal cord paralysis, unilateral partial     "right"  . Depression   . Peripheral neuropathy ([place])   . Angina   . Pneumonia     "i've had it once" (11/18/11)  . Exertional dyspnea   . Headache(784.0) 11/18/11    "I've had mild headaches the last couple days"  . Headache(784.0) 01/08/12    "pretty constant since 11/18/11's concussion"  . Syncope 06/16/2012  . Allergy   . Anxiety   . Blood transfusion without reported diagnosis   . Cataract   . Glaucoma   . Myocardial infarction ([place])   . Osteoporosis    Past Surgical History  Procedure Laterality Date  . Colonoscopy w/ biopsies  11/21/2010    adenoma polyp--no hi grade  dysplasia Dr Collene Mares  . Treatment fistula anal    . Breast reduction surgery  04-21-2003  . Tubal ligation  1987  . Cardiac catheterization  03-02-2004  . Bronchoscopy  07-2007  . Knee arthroscopy  08/2000    right  . Shoulder arthroscopy  08/2000    right  . T-score  09/02/04    -0.54 (low risk currently)  . Total abdominal hysterectomy  10-07-2000  . Cesarean section  1987  . Shoulder arthroscopy w/ rotator cuff repair  10/18/2003    left  . Cataract extraction  1955; 1979    bilateral; right eye  . Eye surgery    . Breast biopsy      bilaterally  . Breast cyst excision      right twice; left once  . Carpal tunnel release      left  . Anterior cervical decomp/discectomy fusion  01/15/2012    Procedure: ANTERIOR CERVICAL DECOMPRESSION/DISCECTOMY FUSION 3 LEVELS;  Surgeon: Eustace Moore, MD;  Location: [facility];  Service: Neurosurgery;  Laterality: N/A;  Anterior Cervical Decompression Discectomy Fusion Cerivcal three four, cervical four five, cervical five six, cervical six seven  . Left and right heart catheterization with coronary angiogram N/A 01/19/2014  Procedure: LEFT AND RIGHT HEART CATHETERIZATION WITH CORONARY ANGIOGRAM;  Surgeon: Sinclair Grooms, MD;  Location: [facility] CATH LAB;  Service: Cardiovascular;  Laterality: N/A;  . Spine surgery     Family History  Problem Relation Age of Onset  . Diabetes Mother   . Colon cancer Neg Hx   . Depression Neg Hx   . Anxiety disorder Neg Hx    Social History  Substance Use Topics  . Smoking status: Former Smoker -- 0.50 packs/day for .5 years    Types: Cigarettes    Quit date: 09/03/1975  . Smokeless tobacco: Never Used  . Alcohol Use: No   OB History    No data available     Review of Systems  Constitutional: Negative for appetite change and fatigue.  HENT: Negative for congestion, ear discharge and sinus pressure.   Eyes: Negative for discharge.  Respiratory: Negative for cough.   Cardiovascular: Negative for chest pain.   Gastrointestinal: Negative for abdominal pain and diarrhea.  Genitourinary: Negative for frequency and hematuria.  Musculoskeletal: Negative for back pain.       Wrist pain on left  Skin: Negative for rash.  Neurological: Negative for seizures and headaches.  Psychiatric/Behavioral: Negative for hallucinations.      Allergies  Bee venom; Propoxyphene hcl; Amlodipine besylate; Hydrocodone; Lisinopril; Metformin and related; Metronidazole; and Valsartan  Home Medications   Prior to Admission medications   Medication Sig Start Date End Date Taking? Authorizing Provider  acyclovir (ZOVIRAX) 200 MG capsule Take 200 mg by mouth 2 (two) times daily.   Yes Historical Provider, MD  allopurinol (ZYLOPRIM) 300 MG tablet TAKE 1 TABLET EVERY DAY 10/25/15  Yes Dickie La, MD  aspirin EC 81 MG tablet Take 81 mg by mouth daily.   Yes Historical Provider, MD  atorvastatin (LIPITOR) 40 MG tablet TAKE 1 TABLET EVERY DAY 12/04/15  Yes Dickie La, MD  azelastine (ASTELIN) 0.1 % nasal spray Place 2 sprays into both nostrils daily. Use in each nostril as directed   Yes Historical Provider, MD  beclomethasone (QVAR) 40 MCG/ACT inhaler Inhale 2 puffs into the lungs daily.   Yes Historical Provider, MD  brimonidine (ALPHAGAN) 0.15 % ophthalmic solution Place 1 drop into both eyes 2 (two) times daily.  12/27/15  Yes Historical Provider, MD  buPROPion (WELLBUTRIN) 100 MG tablet Take 1 tablet (100 mg total) by mouth 2 (two) times daily. 12/28/15 12/27/16 Yes Charlcie Cradle, MD  esomeprazole (NEXIUM) 40 MG capsule Take 1 capsule (40 mg total) by mouth daily. 10/04/14  Yes Dickie La, MD  furosemide (LASIX) 40 MG tablet TAKE 1 TABLET EVERY DAY Patient taking differently: TAKE 1 TABLET BY MOUTH EVERY DAY 10/25/15  Yes Dickie La, MD  Insulin Glargine (LANTUS SOLOSTAR) 100 UNIT/ML Solostar Pen Inject 60 Units into the skin daily at 10 pm. The decrease to 50 units when new dose of Trulicity comes in. 03/09/45  Yes Zenia Resides, MD  latanoprost (XALATAN) 0.005 % ophthalmic solution Place 1 drop into both eyes at bedtime.   Yes Historical Provider, MD  linaclotide (LINZESS) 145 MCG CAPS capsule Take 145 mcg by mouth daily before breakfast.   Yes Historical Provider, MD  losartan (COZAAR) 50 MG tablet Take 1 tablet (50 mg total) by mouth daily. 01/05/16  Yes Zenia Resides, MD  metoprolol succinate (TOPROL-XL) 50 MG 24 hr tablet Take 3 tablets (150 mg total) by mouth daily. Take with or immediately following a meal. 08/29/15  Yes Zenia Resides, MD  montelukast (SINGULAIR) 10 MG tablet Take one by mouth daily prn allergies or asthma 12/13/15  Yes Dickie La, MD  Multiple Vitamins-Minerals (MULTIVITAMIN PO) Take 1 tablet by mouth daily.   Yes Historical Provider, MD  NOVOLOG FLEXPEN 100 UNIT/ML FlexPen INJECT 30 UNITS SUBCUTANEOUSLY WITH THE EARLY MEAL AND 30 UNITS WITH THE LATE MEAL 02/23/15  Yes Dickie La, MD  sertraline (ZOLOFT) 100 MG tablet TAKE 1 AND 1/2 TABLETS AT BEDTIME 12/28/15  Yes Charlcie Cradle, MD  spironolactone (ALDACTONE) 25 MG tablet TAKE 1 TABLET (25 MG TOTAL) BY MOUTH DAILY BEFORE SUPPER. 08/23/15  Yes Dickie La, MD  ACCU-CHEK FASTCLIX LANCETS MISC TEST BLOOD SUGAR  TWO  TO THREE TIMES DAILY 10/25/15   Dickie La, MD  ammonium lactate (LAC-HYDRIN) 12 % lotion Apply 1 application topically 2 (two) times daily as needed for dry skin.    Historical Provider, MD  B-D ULTRAFINE III SHORT PEN 31G X 8 MM MISC USE THREE TIMES DAILY 05/19/15   Dickie La, MD  Blood Glucose Monitoring Suppl (ACCU-CHEK AVIVA PLUS) W/DEVICE KIT Use to check blood sugar 3-4 times daily 10/19/14   Zenia Resides, MD  Dulaglutide (TRULICITY) 3.78 HY/8.5OY SOPN Inject 1.5 mg into the skin once a week. 01/09/16   Zenia Resides, MD  EPINEPHrine 0.3 mg/0.3 mL IJ SOAJ injection Inject 0.3 mLs (0.3 mg total) into the muscle once as needed (for allergic reaction). 01/05/16   Zenia Resides, MD  fluticasone (FLOVENT HFA) 110 MCG/ACT  inhaler Inhale 2 puffs into the lungs daily. Patient not taking: Reported on 01/10/2016 09/20/15   Dickie La, MD  glucose blood test strip Test blood sugar 3-4 times daily 04/13/15   Dickie La, MD  nitroGLYCERIN (NITROSTAT) 0.4 MG SL tablet Place 1 tablet (0.4 mg total) under the tongue every 5 (five) minutes as needed for chest pain. 01/05/16   Zenia Resides, MD  NONFORMULARY OR COMPOUNDED ITEM Reported on 01/10/2016    Historical Provider, MD  salicylic acid 17 % gel Apply topically 2 (two) times daily. Apply small amount to callous area on bottom of foot BID 02/10/14   Boykin Nearing, MD  traMADol (ULTRAM) 50 MG tablet Take 1 tablet (50 mg total) by mouth every 6 (six) hours as needed. 01/10/16   Milton Ferguson, MD  VENTOLIN HFA 108 (90 Base) MCG/ACT inhaler INHALE 2 PUFFS INTO THE LUNGS EVERY 6  HOURS AS NEEDED FOR WHEEZING OR SHORTNESS OF BREATH. 09/12/15   Dickie La, MD  Vitamin D, Ergocalciferol, (DRISDOL) 50000 UNITS CAPS capsule Take 1 capsule (50,000 Units total) by mouth every 30 (thirty) days. Take on 3rd of the month 07/18/14   Zenia Resides, MD   BP 144/70 mmHg  Pulse 70  Temp(Src) 97.5 F (36.4 C) (Oral)  Resp 16  SpO2 96% Physical Exam  Constitutional: She is oriented to person, place, and time. She appears well-developed.  HENT:  Head: Normocephalic.  Eyes: Conjunctivae and EOM are normal. No scleral icterus.  Neck: Neck supple. No thyromegaly present.  Cardiovascular: Normal rate and regular rhythm.  Exam reveals no gallop and no friction rub.   No murmur heard. Pulmonary/Chest: No stridor. She has no wheezes. She has no rales. She exhibits no tenderness.  Abdominal: She exhibits no distension. There is no tenderness. There is no rebound.  Musculoskeletal: She exhibits no edema.  Tender swollen left wrist. Neurovascular normal  Lymphadenopathy:  She has no cervical adenopathy.  Neurological: She is oriented to person, place, and time. She exhibits normal muscle  tone. Coordination normal.  Skin: No rash noted. No erythema.  Psychiatric: She has a normal mood and affect. Her behavior is normal.    ED Course  Procedures (including critical care time) Labs Review Labs Reviewed - No data to display  Imaging Review Dg Wrist Complete Left  01/10/2016  CLINICAL DATA:  Pt states she stepped off curb today and fell on left wrist. Pt complains of left wrist pain. EXAM: LEFT WRIST - COMPLETE 3+ VIEW COMPARISON:  Radiograph 11/19/2011 FINDINGS: Comminuted fracture of the distal radial metaphysis which enters the articular surface. The radiocarpal joint is intact. Normal alignment. No carpal fracture evident IMPRESSION: Intraarticular distal LEFT radial fracture. Electronically Signed   By: Suzy Bouchard M.D.   On: 01/10/2016 18:55   I have personally reviewed and evaluated these images and lab results as part of my medical decision-making.   EKG Interpretation None      MDM   Final diagnoses:  Wrist fracture, left, closed, initial encounter    Patient has a left radial fracture. Closed she will be put in a splint given pain medicine will follow-up with hand    Milton Ferguson, MD 01/10/16 2216

## 2016-01-10 NOTE — ED Notes (Signed)
Bed: [facility] Expected date:  Expected time:  Means of arrival:  Comments: Fall outside

## 2016-01-10 NOTE — ED Notes (Signed)
Pt states "I am not feeling like myself today." reports headache, possible hyperglycemia and elevated BP. Pt a/o and ambulatory at triage.

## 2016-01-10 NOTE — ED Notes (Signed)
Pt wheeled outby this nurse and helped in to her cab. No distress noted.

## 2016-01-10 NOTE — Discharge Instructions (Signed)
Follow-up with Dr.  Caralyn Guile next week

## 2016-01-11 ENCOUNTER — Telehealth: Payer: Self-pay | Admitting: Family Medicine

## 2016-01-11 ENCOUNTER — Ambulatory Visit: Payer: Self-pay

## 2016-01-11 NOTE — Telephone Encounter (Signed)
Pt is calling because she fell at [facility] and broke her left wrist. She has been told to go to [place] but they need a referral from [place]. Please call and talk to Assunta Gambles and her number is [phone] ext [phone]. jw

## 2016-01-12 NOTE — Telephone Encounter (Signed)
Patient called to check on status of referral. Jazmin Hartsell,CMA

## 2016-01-12 NOTE — Telephone Encounter (Signed)
Referral placed for pt. Forwarding to PCP as an [place]. Katharina Caper, April D, [place]

## 2016-01-12 NOTE — Telephone Encounter (Signed)
LVM for melissa at [facility] to call back to see which dr pt was seeing to place referral  and while typing this note she called back and stated that pt will see Dr. Caralyn Guile, NPI [id number]. Katharina Caper, Cherisse Carrell D, [place]

## 2016-01-16 DIAGNOSIS — S52552A Other extraarticular fracture of lower end of left radius, initial encounter for closed fracture: Secondary | ICD-10-CM | POA: Diagnosis not present

## 2016-01-18 ENCOUNTER — Ambulatory Visit (INDEPENDENT_AMBULATORY_CARE_PROVIDER_SITE_OTHER): Payer: Commercial Managed Care - HMO | Admitting: *Deleted

## 2016-01-18 DIAGNOSIS — J309 Allergic rhinitis, unspecified: Secondary | ICD-10-CM

## 2016-01-22 ENCOUNTER — Other Ambulatory Visit: Payer: Self-pay | Admitting: *Deleted

## 2016-01-22 DIAGNOSIS — Z9189 Other specified personal risk factors, not elsewhere classified: Secondary | ICD-10-CM

## 2016-01-23 DIAGNOSIS — S52552D Other extraarticular fracture of lower end of left radius, subsequent encounter for closed fracture with routine healing: Secondary | ICD-10-CM | POA: Diagnosis not present

## 2016-01-24 ENCOUNTER — Telehealth: Payer: Self-pay | Admitting: *Deleted

## 2016-01-24 NOTE — Telephone Encounter (Signed)
Patient called back to speak with pcp.  Informed patient that provider was in clinic this morning and precepting this afternoon.  Patient voiced understanding and will wait for her call.  States that she has received some information that she doesn't believe is correct. Laloni Rowton,CMA

## 2016-01-24 NOTE — Telephone Encounter (Signed)
Pt calling in to speak with dr. She says its urgent and wont give extra information. Please advise. Deseree Kennon Holter, CMA

## 2016-01-25 ENCOUNTER — Ambulatory Visit (INDEPENDENT_AMBULATORY_CARE_PROVIDER_SITE_OTHER): Payer: Commercial Managed Care - HMO

## 2016-01-25 ENCOUNTER — Ambulatory Visit (INDEPENDENT_AMBULATORY_CARE_PROVIDER_SITE_OTHER): Payer: Commercial Managed Care - HMO | Admitting: Internal Medicine

## 2016-01-25 ENCOUNTER — Encounter: Payer: Self-pay | Admitting: Internal Medicine

## 2016-01-25 VITALS — BP 122/70 | HR 75 | Ht 61.0 in | Wt 204.6 lb

## 2016-01-25 DIAGNOSIS — J309 Allergic rhinitis, unspecified: Secondary | ICD-10-CM | POA: Diagnosis not present

## 2016-01-25 DIAGNOSIS — G4733 Obstructive sleep apnea (adult) (pediatric): Secondary | ICD-10-CM

## 2016-01-25 NOTE — Progress Notes (Signed)
Patient ID: [name] [name], female    DOB: 1951-08-26, 65 y.o.   MRN: [id number]   Brief patient profile:   81 yobf never regular smoker quit in the 1977   followed for asthma, sleep apnea, complicated by hx depression, vocal cord paralysis, GERD.  History of Present Illness  Ov  01/24/11  Seen  August 17, 2010- note reviewed  Doesn't wear CPAP- can't tolerate it on her face. Didn't like nasal pillows or her current nasal mask. Has own teeth. Aware she tosses and turns at night. CPAP 12 Advanced.  Denies recent asthma attacks. Uses rescue inhaler 1-2x/ month. Continues allergy shots and says she breathes better and coughs less since she has been on them.  No longer chokes or coughs much with eating as she used to. No recent colds.   07/31/11-   followed for asthma, sleep apnea, complicated by hx depression, vocal cord paralysis, GERD. Has had flu vaccine. Continues CPAP at 12 CWP,  used all night every night. She had moved, and can't find her nebulizer machine. We discussed whether she would use it this winter. She continues allergy vaccine and believes that has helped.  03/01/13- followed for asthma/ COPD, sleep apnea, complicated by hx depression, vocal cord paralysis, GERD. FOLLOWS FOR: has good and bad days since no longer on vaccine; Wears CPAP 12/ Advanced every night for about 8-9 hours; pressure working well for patient. Neck pain s/p c-spine fusion, ,imits comfortable sleep position with CPAP. Occ wheeze and persistent nose and chest congestion despite meds. Would like to restart allergy vaccine- discussed. Thinks she is mobile enough with [facility] transport to get here.  PFT 2011 documented moderate obstruction w/ little response to bronchodilator.   07/01/13-   followed for asthma/ COPD, sleep apnea, complicated by hx depression, vocal cord paralysis, GERD. FOLLOWS FOR: wears CPAP 12/ Advanced every night for abour 8 or more hours; pressure doing well for patient. Still on allergy  vaccine; noticed she had some bumps on arms after her injections(varies). Has noticed that about 5-10 minutes after Brovana treatment she has jitters and nervous feelings. Holding vaccine dose at 1:5000 [place]. She notices minor bumps but may be unrelated.  01/03/14-  r followed for asthma/ COPD, sleep apnea, complicated by hx depression, vocal cord paralysis, GERD. FOLLOWS FOR: continues to wear CPAP12/[facility] every night for about 5-9 hours; pressure is doing well; DME is [facility]. Pt is still on vaccine and doing well. CPAP doing well.  Allergy vaccine 1:50 GH, holding. Jaw pain w/ sustained walking in January. Sees Dr P.Ross/ Cards. Cath neg.  07/06/14-  followed for asthma/ COPD, sleep apnea, complicated by hx depression, vocal cord paralysis, GERD FOLLOWS FOR: Pt taking allergy vaccine and is going well. Pt c/o dizziness that presented today. Pt had unsteady gate when walking to exam room and needed wheelchair. Pt states she is wearing her CPAP12/[facility] nightly for 5-8 hours. Pt denies issues with mask, pressure or machine.  Allergy vaccine 1:10 GH, holding. Has been feeling dizzy/unsteady. This is not new and has been intermittent over at least the last couple of years. She thinks she is doing well with allergy vaccine and seeks no changes Describes good compliance and control with CPAP CXR 05/25/14 IMPRESSION: No radiographic evidence of acute cardiopulmonary disease. Signed, Dulcy Fanny. [name], DO [facility] Radiology Electronically Signed  By: Corrie Mckusick O.D.  On: 05/25/2014 21:47  01/03/15-  Dr Annamaria Boots  followed for asthma/ COPD, sleep apnea, complicated by  hx depression, vocal cord paralysis, GERD FOLLOWS FOR: Pt still on vaccine and doing well; no issues with vaccine. Pt states she wears CPAP 12/ [employer]  every night for about 4-9 hours.  Allergy vaccine 1:10 [place] She feels she is doing well both both therapies.  rec We can continue  allergy vaccine 1:10 GH  We can continue CPAP 12/ [employer] Script to try Cheracol cough syrup  06/22/2015  f/u ov/Wert re: acute on chronic cough ? Etiology  Chief Complaint  Patient presents with  . Acute Visit    Pt c/o chest congestion, chest tightness and cough for the past wk. She has also noticed more wheezing. She is using albuterol inhaler 2 x daily on average.    Even when fine needs dose of narcotic cough med at hs despite maint rx with   singulair and qvar 80 2 pffs in am then prn at hs  Acutely worse x one week assoc with sense of chest congestion and tightness but no excess or purulent mucus production  07/12/15- . 65 year old female never regular smoker followed for asthma/ COPD, OSA, complicated by hx depression, vocal cord paralysis, GERD, obesity, DM 2, glaucoma Allergy vaccine 1:10 GH  CPAP 12/ [employer] FOLLOWS FOR: Pt seen last for by [facility] 06-22-15 for acute-cough; Pt states she has not been able to get rid of her cough. Continues allergy vaccine and no reactions.  PFT 07/19/2010-moderate obstructive airways disease, insignificant response to bronchodilator, FEV1/FVC 0.65, TLC 63%, DLCO 71% Had recent acute visit was directed to prednisone, Delsym (can't afford) Qvar and Nexium These were little help for persistent dry cough. She isn't sure if this began with a cold-nothing specific. Wheeze has resolved. Has not had fever or sore throat. Sleeps with elevated head of bed but still notices some reflux occasionally, mostly daytime. Continues CPAP was directed.  01/25/2016-65 year old female former occasional smoker followed for asthma/COPD, OSA, complicated by history depression, vocal cord paralysis, GERD, obesity, DM 2, glaucoma Allergy vaccine 1:10 GH CPAP 12/[facility] CXR 07/12/2015-NAD, mild CE FOLLOWS FOR: DME: [facility]; DL attached. Pt has not been able to afford supplies for CPAP and really needs them. Pt wears her CPAP nightly. Pt continues her allergy vaccine and no  reactions. Occasional wheeze and cough- not bad. Feels allergy vaccine continues to help.   ROS-see HPI   Negative unless "+" Constitutional:    weight loss, night sweats, fevers, chills, fatigue, lassitude. HEENT:    headaches, difficulty swallowing, tooth/dental problems, sore throat,       sneezing, itching, ear ache, nasal congestion, post nasal drip, snoring CV:    chest pain, orthopnea, PND, swelling in lower extremities, anasarca,                                                          dizziness, palpitations Resp:   shortness of breath with exertion or at rest.                productive cough,   non-productive cough, coughing up of blood.              change in color of mucus.  wheezing.   Skin:    rash or lesions. GI:  No-   heartburn, indigestion, abdominal pain, nausea, vomiting, diarrhea,  change in bowel habits, loss of appetite GU: dysuria, change in color of urine, no urgency or frequency.   flank pain. MS:   joint pain, stiffness, decreased range of motion, back pain. Neuro-     nothing unusual Psych:  change in mood or affect.  depression or anxiety.   memory loss.  OBJ- Physical Exam General- Alert, Oriented, Affect-appropriate, Distress- none acute, + rolling walker, obese Skin- rash-none, lesions- none, excoriation- none Lymphadenopathy- none Head- atraumatic            Eyes- impaired/thick glasses, PERRLA, conjunctivae and secretions clear            Ears- Hearing, canals-normal            Nose- Clear, no-Septal dev, mucus, polyps, erosion, perforation             Throat- Mallampati II , mucosa clear , drainage- none, tonsils- atrophic Neck- flexible , trachea midline, no stridor , thyroid nl, carotid no bruit Chest - symmetrical excursion , unlabored           Heart/CV- RRR , no murmur , no gallop  , no rub, nl s1 s2                           - JVD- none , edema- none, stasis changes- none, varices- none           Lung- clear to P&A, wheeze-  none, cough- none , dullness-none, rub- none           Chest wall-  Abd-  Br/ Gen/ Rectal- Not done, not indicated Extrem- cyanosis- none, clubbing, none, atrophy- none, strength- nl, + cast L wrist. Neuro- grossly intact to observation

## 2016-01-25 NOTE — Telephone Encounter (Signed)
[phone] Suezanne Jacquet (M) No answer at  Home lvm on mobile Will try to call her back later today [name]

## 2016-01-25 NOTE — Patient Instructions (Signed)
Order- DME Huey Romans- any suggestions to help with replacement of CPAP mask of choice and supplies would really help her for dx OSA  We can continue current meds  Please call as needed

## 2016-01-25 NOTE — Telephone Encounter (Signed)
Broke her wrist Having difficulty with transportation costs, copays at Dr,. Ortman's office. Will discuss w our SW and see if we have any resources. [name] [name]

## 2016-01-26 ENCOUNTER — Encounter: Payer: Self-pay | Admitting: Family Medicine

## 2016-01-26 DIAGNOSIS — S52552D Other extraarticular fracture of lower end of left radius, subsequent encounter for closed fracture with routine healing: Secondary | ICD-10-CM | POA: Diagnosis not present

## 2016-01-29 ENCOUNTER — Other Ambulatory Visit: Payer: Self-pay | Admitting: Family Medicine

## 2016-01-29 MED ORDER — DULAGLUTIDE 0.75 MG/0.5ML ~~LOC~~ SOAJ: 1.5000 mg | SUBCUTANEOUS | Status: DC

## 2016-01-29 NOTE — Progress Notes (Signed)
Re Trulicity, the injection is weekly. I have clarified with [facility] pharmacy and faxed their form back necessary

## 2016-02-01 ENCOUNTER — Ambulatory Visit (INDEPENDENT_AMBULATORY_CARE_PROVIDER_SITE_OTHER): Payer: Commercial Managed Care - HMO

## 2016-02-01 DIAGNOSIS — J309 Allergic rhinitis, unspecified: Secondary | ICD-10-CM | POA: Diagnosis not present

## 2016-02-05 DIAGNOSIS — G4733 Obstructive sleep apnea (adult) (pediatric): Secondary | ICD-10-CM | POA: Diagnosis not present

## 2016-02-06 ENCOUNTER — Ambulatory Visit (INDEPENDENT_AMBULATORY_CARE_PROVIDER_SITE_OTHER): Payer: Commercial Managed Care - HMO | Admitting: Family Medicine

## 2016-02-06 ENCOUNTER — Encounter: Payer: Self-pay | Admitting: Family Medicine

## 2016-02-06 ENCOUNTER — Ambulatory Visit (HOSPITAL_COMMUNITY)
Admission: RE | Admit: 2016-02-06 | Discharge: 2016-02-06 | Disposition: A | Discharge status: Home / Self Care | Payer: Commercial Managed Care - HMO | Source: Ambulatory Visit | Attending: Family Medicine | Admitting: Family Medicine

## 2016-02-06 VITALS — BP 133/65 | HR 79 | Temp 98.1°F | Ht 63.0 in | Wt 206.6 lb

## 2016-02-06 DIAGNOSIS — Z029 Encounter for administrative examinations, unspecified: Secondary | ICD-10-CM | POA: Insufficient documentation

## 2016-02-06 DIAGNOSIS — R079 Chest pain, unspecified: Secondary | ICD-10-CM

## 2016-02-06 NOTE — Progress Notes (Signed)
Subjective: Nya Balam is a 65 y.o. female presenting for chest pain.  She reports mid-chest pain that seems superficial that has been intermittent for about 1 month, starting after she fell on her hand, breaking her hand. Pain is mild to moderate, comes and goes, worse with changing positions or moving arms around (e.g. changing positions in bed, bending and reaching down to the floor when standing), not improved by rest or by nitroglycerin. No other prn medications tried. No dyspnea, palpitations, diaphoresis, N/V associated with this. No recent leg swelling, orthopnea, PND.  She is followed by Dorris Carnes for cardiology. Has had a heart murmur since she was born. Last cardiac cath was negative.   - ROS: As above - Non-smoker  Objective: BP 133/65 mmHg  Pulse 79  Temp(Src) 98.1 F (36.7 C) (Oral)  Ht 5\' 3"  (1.6 m)  Wt 206 lb 9.6 oz (93.713 kg)  BMI 36.61 kg/m2 Gen: Well-appearing 65 y.o.female in NAD HEENT: MMM, sclerae/conjunctivae clear, posterior oropharynx clear, poor dentition Neck: neck supple; thyroid not enlarged  Pulm: Non-labored on room air; normal RR; CTAB, no wheezes  CV: Regular rate, II/VI holosystolic murmur at LUSB radiating to carotids, rub or gallop; trace LE edema, no JVD; distal pulses 2+ GI: + BS; soft, NT, ND, no HSM Skin: No vesicular lesions, erythema or signs of trauma noted. No midline chest incision. MSK: + reproduction of chest pain on palpation and with resisted ROM of arms; wearing left wrist cast; trace lower extremity swelling or pain on palpation, homan's sign negative.  Psych: A&Ox3, mood mildly anxious with congruent affect  ECG: Regular rate (72), NSR, normal axis, QTc not prolonged, no ST segment changes or T wave abnormalities (baseline wander toward end of tracing not significant). No conduction delay. Same when compared to old ECG.   Assessment/Plan: Anastasia Cuccio is a 65 y.o. female here for chest pain most likely of  musculoskeletal etiology.  Chest pain Low risk for cardiac ischemia given previously negative cath, low risk history and ECG in office today. Pt mostly interested in ruling out heart attack, and I feel we've done that with reasonable certainty. She will continue taking tramadol and follow up with PCP next week or sooner if symptoms change.

## 2016-02-06 NOTE — Assessment & Plan Note (Signed)
Low risk for cardiac ischemia given previously negative cath, low risk history and ECG in office today. Pt mostly interested in ruling out heart attack, and I feel we've done that with reasonable certainty. She will continue taking tramadol and follow up with PCP next week or sooner if symptoms change.

## 2016-02-06 NOTE — Patient Instructions (Signed)
I believe your chest pain is due to inflammation at your sternum related to/or worsened by the fall you took. It should go away on its own over time. Your EKG looks normal and I highly doubt you have a problem with your heart causing this pain. If your symptoms get worse, please call us or seek medical care immediately. Make sure to follow up with Dr. Nori Riis next week.

## 2016-02-08 ENCOUNTER — Ambulatory Visit (INDEPENDENT_AMBULATORY_CARE_PROVIDER_SITE_OTHER): Payer: Commercial Managed Care - HMO

## 2016-02-08 DIAGNOSIS — J309 Allergic rhinitis, unspecified: Secondary | ICD-10-CM | POA: Diagnosis not present

## 2016-02-12 ENCOUNTER — Other Ambulatory Visit: Payer: Self-pay | Admitting: Pharmacist

## 2016-02-12 NOTE — Patient Outreach (Signed)
[facility]) Care Management  02/12/2016  Julius Santodomingo 1951-02-22 [id number]  [place] received referral from [place], [facility] LCSW that patient had concerns about filling her pill box.  [place] helped patient with filling her pill box about one year ago per chart review.   Placed call to patient who verified her name, date of birth, address.  Explained purpose of call to patient.   Patient states that she was previously filling her pill box but recently fractured her wrist and is having trouble opening some of her bottles.  She reports she has her medications and a 31 day pill planner at home.    Plan:  Hom visit scheduled with patient.    Karrie Meres, PharmD, [place] [phone]

## 2016-02-13 ENCOUNTER — Other Ambulatory Visit: Payer: Self-pay | Admitting: Pharmacist

## 2016-02-13 NOTE — Patient Outreach (Signed)
[facility]) Care Management  [place]   02/13/2016  Thu Baggett 09-01-1951 [id number]  Subjective: [place] referral received from [place], [facility] LCSW to assist patient in filling pill boxes.   Home visit with patient completed at her place of residence on 02/13/16.  Patient had all of her pill bottles and her monthly pill boxes ready for home visit.  Patient reports that since she fractured her wrist, she has had difficulty opening her pill bottles and pill boxes on her monthly pill planner.   She denies cost concerns with her medications.   Objective:   Encounter Medications: Outpatient Encounter Prescriptions as of 02/13/2016  Medication Sig Note  . ACCU-CHEK FASTCLIX LANCETS MISC TEST BLOOD SUGAR  TWO  TO THREE TIMES DAILY   . acyclovir (ZOVIRAX) 200 MG capsule Take 200 mg by mouth 2 (two) times daily.   [name] allopurinol (ZYLOPRIM) 300 MG tablet TAKE 1 TABLET EVERY DAY   . ammonium lactate (LAC-HYDRIN) 12 % lotion Apply 1 application topically 2 (two) times daily as needed for dry skin.   [name] aspirin EC 81 MG tablet Take 81 mg by mouth daily.   [name] atorvastatin (LIPITOR) 40 MG tablet TAKE 1 TABLET EVERY DAY   . azelastine (ASTELIN) 0.1 % nasal spray Place 2 sprays into both nostrils daily. Use in each nostril as directed   . B-D ULTRAFINE III SHORT PEN 31G X 8 MM MISC USE THREE TIMES DAILY   . beclomethasone (QVAR) 40 MCG/ACT inhaler Inhale 2 puffs into the lungs daily.   . Blood Glucose Monitoring Suppl (ACCU-CHEK AVIVA PLUS) W/DEVICE KIT Use to check blood sugar 3-4 times daily   . brimonidine (ALPHAGAN) 0.15 % ophthalmic solution Place 1 drop into both eyes 2 (two) times daily.  01/10/2016: Received from: External Pharmacy  . buPROPion (WELLBUTRIN) 100 MG tablet Take 1 tablet (100 mg total) by mouth 2 (two) times daily.   . Dulaglutide (TRULICITY) 8.89 VQ/9.4HW SOPN Inject 1.5 mg into the skin once a week.   [name] EPINEPHrine 0.3 mg/0.3 mL IJ SOAJ  injection Inject 0.3 mLs (0.3 mg total) into the muscle once as needed (for allergic reaction). 01/10/2016: Hasn't used medication.  [name] esomeprazole (NEXIUM) 40 MG capsule Take 1 capsule (40 mg total) by mouth daily.   . fluticasone (FLOVENT HFA) 110 MCG/ACT inhaler Inhale 2 puffs into the lungs daily.   . furosemide (LASIX) 40 MG tablet TAKE 1 TABLET EVERY DAY (Patient taking differently: TAKE 1 TABLET BY MOUTH EVERY DAY)   . glucose blood test strip Test blood sugar 3-4 times daily   . Insulin Glargine (LANTUS SOLOSTAR) 100 UNIT/ML Solostar Pen Inject 60 Units into the skin daily at 10 pm. The decrease to 50 units when new dose of Trulicity comes in.   . latanoprost (XALATAN) 0.005 % ophthalmic solution Place 1 drop into both eyes at bedtime.   [name] linaclotide (LINZESS) 145 MCG CAPS capsule Take 145 mcg by mouth daily before breakfast.   . losartan (COZAAR) 50 MG tablet Take 1 tablet (50 mg total) by mouth daily.   . metoprolol succinate (TOPROL-XL) 50 MG 24 hr tablet Take 3 tablets (150 mg total) by mouth daily. Take with or immediately following a meal.   . montelukast (SINGULAIR) 10 MG tablet Take one by mouth daily prn allergies or asthma   . Multiple Vitamins-Minerals (MULTIVITAMIN PO) Take 1 tablet by mouth daily.   . nitroGLYCERIN (NITROSTAT) 0.4 MG SL tablet Place 1 tablet (0.4  mg total) under the tongue every 5 (five) minutes as needed for chest pain. 01/10/2016: Hasn't used medication.  . NONFORMULARY OR COMPOUNDED ITEM Reported on 01/10/2016   . NOVOLOG FLEXPEN 100 UNIT/ML FlexPen INJECT 30 UNITS SUBCUTANEOUSLY WITH THE EARLY MEAL AND 30 UNITS WITH THE LATE MEAL   . salicylic acid 17 % gel Apply topically 2 (two) times daily. Apply small amount to callous area on bottom of foot BID 03/22/2015: As needed   . sertraline (ZOLOFT) 100 MG tablet TAKE 1 AND 1/2 TABLETS AT BEDTIME   . spironolactone (ALDACTONE) 25 MG tablet TAKE 1 TABLET (25 MG TOTAL) BY MOUTH DAILY BEFORE SUPPER.   . traMADol  (ULTRAM) 50 MG tablet Take 1 tablet (50 mg total) by mouth every 6 (six) hours as needed.   . TRULICITY 1.5 DH/7.4BU SOPN Start on Sunday February 04 2016 01/25/2016: Received from: External Pharmacy Received Sig:   . VENTOLIN HFA 108 (90 Base) MCG/ACT inhaler INHALE 2 PUFFS INTO THE LUNGS EVERY 6  HOURS AS NEEDED FOR WHEEZING OR SHORTNESS OF BREATH.   [name] Vitamin D, Ergocalciferol, (DRISDOL) 50000 UNITS CAPS capsule Take 1 capsule (50,000 Units total) by mouth every 30 (thirty) days. Take on 3rd of the month    No facility-administered encounter medications on file as of 02/13/2016.    Functional Status: In your present state of health, do you have any difficulty performing the following activities: 07/20/2015 06/21/2015  Hearing? N N  Vision? Y Y  Difficulty concentrating or making decisions? N N  Walking or climbing stairs? Y Y  Dressing or bathing? N N  Doing errands, shopping? N N  Preparing Food and eating ? N N  Using the Toilet? N N  In the past six months, have you accidently leaked urine? Y N  Do you have problems with loss of bowel control? Y Y  Managing your Medications? N N  Managing your Finances? N Y  Housekeeping or managing your Housekeeping? [name]    Fall/Depression Screening: PHQ 2/9 Scores 02/06/2016 07/20/2015 06/28/2015 06/21/2015 06/19/2015 05/17/2015 04/19/2015  PHQ - 2 Score 0 1 0 0 0 0 0    Assessment:  Patient reports unable to open her pill planner with her fractured wrist.  Provided patient with two 7-day pill planners that she states she would be able to be open.    Both pill planners were filled, one by pharmacist, one by patient.    Discussed [facility]) which may be able to help patient with filling pill planners during this time she is unable to.  Discussed with patient that there is an income-based fee charged for this program where a nurse would come out and fill her pill planners.   Patient reports she is  interested in learning more about this.    Plan:  Two 7 day pill planners are filled for patient with her medications.   Will refer patient to [facility]   Will follow-up with patient in about two weeks as she will need pill boxes refilled by that time.   Karrie Meres, PharmD, [place] [phone]

## 2016-02-14 ENCOUNTER — Encounter: Payer: Self-pay | Admitting: Pharmacist

## 2016-02-14 ENCOUNTER — Ambulatory Visit (INDEPENDENT_AMBULATORY_CARE_PROVIDER_SITE_OTHER): Payer: Commercial Managed Care - HMO | Admitting: Family Medicine

## 2016-02-14 ENCOUNTER — Encounter: Payer: Self-pay | Admitting: Family Medicine

## 2016-02-14 VITALS — BP 120/54 | HR 77 | Temp 97.7°F | Ht 61.0 in | Wt 206.0 lb

## 2016-02-14 DIAGNOSIS — I10 Essential (primary) hypertension: Secondary | ICD-10-CM | POA: Diagnosis not present

## 2016-02-14 DIAGNOSIS — E785 Hyperlipidemia, unspecified: Secondary | ICD-10-CM | POA: Diagnosis not present

## 2016-02-14 DIAGNOSIS — E1149 Type 2 diabetes mellitus with other diabetic neurological complication: Secondary | ICD-10-CM | POA: Diagnosis not present

## 2016-02-14 LAB — POCT GLYCOSYLATED HEMOGLOBIN (HGB A1C): Hemoglobin A1C: 7.6

## 2016-02-15 ENCOUNTER — Ambulatory Visit (INDEPENDENT_AMBULATORY_CARE_PROVIDER_SITE_OTHER): Payer: Commercial Managed Care - HMO | Admitting: *Deleted

## 2016-02-15 DIAGNOSIS — J309 Allergic rhinitis, unspecified: Secondary | ICD-10-CM | POA: Diagnosis not present

## 2016-02-15 LAB — COMPLETE METABOLIC PANEL WITH GFR
ALT: 40 U/L — ABNORMAL HIGH (ref 6–29)
AST: 39 U/L — ABNORMAL HIGH (ref 10–35)
Albumin: 4 g/dL (ref 3.6–5.1)
Alkaline Phosphatase: 116 U/L (ref 33–130)
BUN: 17 mg/dL (ref 7–25)
CO2: 25 mmol/L (ref 20–31)
Calcium: 9.7 mg/dL (ref 8.6–10.4)
Chloride: 101 mmol/L (ref 98–110)
Creat: 0.89 mg/dL (ref 0.50–0.99)
GFR, Est African American: 80 mL/min (ref >=60)
GFR, Est Non African American: 69 mL/min (ref >=60)
Glucose, Bld: 114 mg/dL — ABNORMAL HIGH (ref 65–99)
Potassium: 4 mmol/L (ref 3.5–5.3)
Sodium: 140 mmol/L (ref 135–146)
Total Bilirubin: 0.3 mg/dL (ref 0.2–1.2)
Total Protein: 7.3 g/dL (ref 6.1–8.1)

## 2016-02-15 NOTE — Assessment & Plan Note (Signed)
No medication changes. A1c pending. We'll check kidney function today.

## 2016-02-15 NOTE — Progress Notes (Signed)
Subjective:    Patient ID: [name] [name], female    DOB: [birth year], 65 y.o.   MRN: [id number]  HPI #1. Diabetes mellitus: Follow-up. Sugars have been doing a little bit better. No episodes of low blood sugar. Diet still restricted somewhat by financial issues. #2. Left wrist fracture: Been followed by orthopedics. She still having quite a bit of pain. #3. 3 chest pain: Was seen here and was diagnosed with muscle skeletal chest pain secondary to her fall. It has improved but is still present. Currently 1-3 out of 10. Worse with movement. No shortness of breath.   Review of Systems See history of present illness. Additional pertinent review of systems is negative for unusual weight change, increased thirst, increased urination, nausea vomiting or diarrhea.    Objective:   Physical Exam  Vital signs reviewed. GENERAL: Well-developed, well-nourished, no acute distress. Using a rolling walker. HEENT: Blind: Resting misdiagnosed. CARDIOVASCULAR: Regular rate and rhythm no murmur gallop or rub LUNGS: Clear to auscultation bilaterally, no rales or wheeze. ABDOMEN: Soft positive bowel sounds NEURO: Decreased sensation soft touch stocking glove distribution. Visual loss bilaterally. MSK: Movement of extremity x 4. Left wrist is in a cast. Her fingers are mobile and there is no sign of swelling        Assessment & Plan:  Wrist fracture being followed by orthopedics. He doesn't like she's probably going to be in a short arm cast for some extended period time. I did review her x-rays with her. Also given [employer] taxi vouchers to help her with assistance getting to and from her orthopedist's office.

## 2016-02-15 NOTE — Assessment & Plan Note (Signed)
Blood pressures fairly well controlled. I do think her chest pain was related to her fall and musculoskeletal injury rather than any type of a vascular disease. Continue current medication regimen.

## 2016-02-15 NOTE — Assessment & Plan Note (Signed)
Check labs 

## 2016-02-19 ENCOUNTER — Encounter: Payer: Self-pay | Admitting: Family Medicine

## 2016-02-20 ENCOUNTER — Telehealth: Payer: Self-pay | Admitting: Family Medicine

## 2016-02-20 DIAGNOSIS — S52552D Other extraarticular fracture of lower end of left radius, subsequent encounter for closed fracture with routine healing: Secondary | ICD-10-CM | POA: Diagnosis not present

## 2016-02-20 NOTE — Telephone Encounter (Signed)
Pt wanted dr Nori Riis to know the vouchers she has do not have any dates on them.

## 2016-02-22 ENCOUNTER — Ambulatory Visit (INDEPENDENT_AMBULATORY_CARE_PROVIDER_SITE_OTHER): Payer: Commercial Managed Care - HMO | Admitting: *Deleted

## 2016-02-22 ENCOUNTER — Ambulatory Visit: Payer: Self-pay

## 2016-02-22 DIAGNOSIS — J309 Allergic rhinitis, unspecified: Secondary | ICD-10-CM | POA: Diagnosis not present

## 2016-02-23 ENCOUNTER — Encounter: Payer: Self-pay | Admitting: Pharmacist

## 2016-02-23 ENCOUNTER — Other Ambulatory Visit: Payer: Self-pay | Admitting: Pharmacist

## 2016-02-23 ENCOUNTER — Other Ambulatory Visit: Payer: Self-pay | Admitting: Family Medicine

## 2016-02-23 ENCOUNTER — Other Ambulatory Visit (HOSPITAL_COMMUNITY): Payer: Self-pay | Admitting: Psychiatry

## 2016-02-23 NOTE — Patient Outreach (Signed)
[facility]) Care Management  [place]   02/23/2016  [name] [name] 02-26-1951 [id number]  Subjective:  Follow-up home visit with patient for pill planner filling with patient.  Patient reports that she has all of her medications and she reports that she has taken her medications as prescribed by her providers.  She reports that since she had the cast removed from her arm/wrist, she is able to open her pill bottles better and that she is able to fill the 7 day pill planners.    Patient denies any medication questions or concerns at this time.    She denies wanting a [facility] referral at this time for pill planner filling.    Objective:   Encounter Medications: Outpatient Encounter Prescriptions as of 02/23/2016  Medication Sig Note  . ACCU-CHEK FASTCLIX LANCETS MISC TEST BLOOD SUGAR  TWO  TO THREE TIMES DAILY   . acyclovir (ZOVIRAX) 200 MG capsule Take 200 mg by mouth 2 (two) times daily.   [name] allopurinol (ZYLOPRIM) 300 MG tablet TAKE 1 TABLET EVERY DAY   . ammonium lactate (LAC-HYDRIN) 12 % lotion Apply 1 application topically 2 (two) times daily as needed for dry skin.   [name] aspirin EC 81 MG tablet Take 81 mg by mouth daily.   [name] atorvastatin (LIPITOR) 40 MG tablet TAKE 1 TABLET EVERY DAY   . azelastine (ASTELIN) 0.1 % nasal spray Place 2 sprays into both nostrils daily. Use in each nostril as directed   . B-D ULTRAFINE III SHORT PEN 31G X 8 MM MISC USE THREE TIMES DAILY   . beclomethasone (QVAR) 40 MCG/ACT inhaler Inhale 2 puffs into the lungs daily. Reported on 02/14/2016   . Blood Glucose Monitoring Suppl (ACCU-CHEK AVIVA PLUS) W/DEVICE KIT Use to check blood sugar 3-4 times daily   . brimonidine (ALPHAGAN) 0.15 % ophthalmic solution Place 1 drop into both eyes 2 (two) times daily.  01/10/2016: Received from: External Pharmacy  . buPROPion (WELLBUTRIN) 100 MG tablet Take 1 tablet (100 mg total) by mouth 2 (two) times daily.   . Dulaglutide (TRULICITY) 0.63  KZ/6.0FU SOPN Inject 1.5 mg into the skin once a week.   [name] EPINEPHrine 0.3 mg/0.3 mL IJ SOAJ injection Inject 0.3 mLs (0.3 mg total) into the muscle once as needed (for allergic reaction). 01/10/2016: Hasn't used medication.  [name] esomeprazole (NEXIUM) 40 MG capsule Take 1 capsule (40 mg total) by mouth daily.   . fluticasone (FLOVENT HFA) 110 MCG/ACT inhaler Inhale 2 puffs into the lungs daily.   . furosemide (LASIX) 40 MG tablet TAKE 1 TABLET EVERY DAY (Patient taking differently: TAKE 1 TABLET BY MOUTH EVERY DAY)   . glucose blood test strip Test blood sugar 3-4 times daily   . Insulin Glargine (LANTUS SOLOSTAR) 100 UNIT/ML Solostar Pen Inject 60 Units into the skin daily at 10 pm. The decrease to 50 units when new dose of Trulicity comes in.   . latanoprost (XALATAN) 0.005 % ophthalmic solution Place 1 drop into both eyes at bedtime.   [name] linaclotide (LINZESS) 145 MCG CAPS capsule Take 145 mcg by mouth daily before breakfast. Reported on 02/14/2016   . losartan (COZAAR) 50 MG tablet Take 1 tablet (50 mg total) by mouth daily.   . metoprolol succinate (TOPROL-XL) 50 MG 24 hr tablet Take 3 tablets (150 mg total) by mouth daily. Take with or immediately following a meal.   . montelukast (SINGULAIR) 10 MG tablet Take one by mouth daily prn allergies or asthma   .  Multiple Vitamins-Minerals (MULTIVITAMIN PO) Take 1 tablet by mouth daily.   . nitroGLYCERIN (NITROSTAT) 0.4 MG SL tablet Place 1 tablet (0.4 mg total) under the tongue every 5 (five) minutes as needed for chest pain. 01/10/2016: Hasn't used medication.  . NONFORMULARY OR COMPOUNDED ITEM Reported on 01/10/2016   . NOVOLOG FLEXPEN 100 UNIT/ML FlexPen INJECT 30 UNITS SUBCUTANEOUSLY WITH THE EARLY MEAL AND 30 UNITS WITH THE LATE MEAL   . salicylic acid 17 % gel Apply topically 2 (two) times daily. Apply small amount to callous area on bottom of foot BID (Patient not taking: Reported on 02/14/2016) 03/22/2015: As needed   . sertraline (ZOLOFT) 100 MG  tablet TAKE 1 AND 1/2 TABLETS AT BEDTIME   . spironolactone (ALDACTONE) 25 MG tablet TAKE 1 TABLET (25 MG TOTAL) BY MOUTH DAILY BEFORE SUPPER.   . traMADol (ULTRAM) 50 MG tablet Take 1 tablet (50 mg total) by mouth every 6 (six) hours as needed.   . TRULICITY 1.5 SE/8.3TD SOPN Start on Sunday February 04 2016 01/25/2016: Received from: External Pharmacy Received Sig:   . VENTOLIN HFA 108 (90 Base) MCG/ACT inhaler INHALE 2 PUFFS INTO THE LUNGS EVERY 6  HOURS AS NEEDED FOR WHEEZING OR SHORTNESS OF BREATH.   [name] Vitamin D, Ergocalciferol, (DRISDOL) 50000 UNITS CAPS capsule Take 1 capsule (50,000 Units total) by mouth every 30 (thirty) days. Take on 3rd of the month    No facility-administered encounter medications on file as of 02/23/2016.    Functional Status: In your present state of health, do you have any difficulty performing the following activities: 07/20/2015 06/21/2015  Hearing? N N  Vision? Y Y  Difficulty concentrating or making decisions? N N  Walking or climbing stairs? Y Y  Dressing or bathing? N N  Doing errands, shopping? N N  Preparing Food and eating ? N N  Using the Toilet? N N  In the past six months, have you accidently leaked urine? Y N  Do you have problems with loss of bowel control? Y Y  Managing your Medications? N N  Managing your Finances? N Y  Housekeeping or managing your Housekeeping? [name]    Fall/Depression Screening: PHQ 2/9 Scores 02/14/2016 02/06/2016 07/20/2015 06/28/2015 06/21/2015 06/19/2015 05/17/2015  PHQ - 2 Score 0 0 1 0 0 0 0    Assessment:  Filled one 7 day pill planner with patient and patient correctly filled her other 7 day pill planner.  She is able to fill her pill planners based on the directions for her medications.   Had discussed a [facility] referral with patient at previous home visit for pill planner filling and patient is no longer interested in [facility] at this time.  She feels that she will be able to fill the 7day pill planners on her own and she  was able to fill a pill planner during this pharmacist home visit.    Plan:  Given that patient denies further pharmacy needs at this time and she is able to fill her pill planner, will close out pharmacy case at this time.    Will send in basket to [facility] for case closure, will also send patient and provider case closure letters.   Patient has contact information for [facility] and would be happy to assist patient if a new pharmacy need arises.    Karrie Meres, PharmD, [place] [phone]

## 2016-02-27 ENCOUNTER — Other Ambulatory Visit: Payer: Self-pay | Admitting: Family Medicine

## 2016-02-29 ENCOUNTER — Ambulatory Visit (INDEPENDENT_AMBULATORY_CARE_PROVIDER_SITE_OTHER): Payer: Commercial Managed Care - HMO

## 2016-02-29 DIAGNOSIS — J309 Allergic rhinitis, unspecified: Secondary | ICD-10-CM

## 2016-03-04 ENCOUNTER — Other Ambulatory Visit (HOSPITAL_COMMUNITY): Payer: Self-pay | Admitting: Psychiatry

## 2016-03-06 ENCOUNTER — Encounter: Payer: Self-pay | Admitting: Family Medicine

## 2016-03-06 ENCOUNTER — Telehealth: Payer: Self-pay | Admitting: *Deleted

## 2016-03-06 ENCOUNTER — Ambulatory Visit (INDEPENDENT_AMBULATORY_CARE_PROVIDER_SITE_OTHER): Payer: Commercial Managed Care - HMO | Admitting: Family Medicine

## 2016-03-06 VITALS — BP 138/55 | HR 76 | Temp 97.5°F | Ht 61.0 in | Wt 205.4 lb

## 2016-03-06 DIAGNOSIS — S62102A Fracture of unspecified carpal bone, left wrist, initial encounter for closed fracture: Secondary | ICD-10-CM | POA: Insufficient documentation

## 2016-03-06 DIAGNOSIS — S62102S Fracture of unspecified carpal bone, left wrist, sequela: Secondary | ICD-10-CM | POA: Diagnosis not present

## 2016-03-06 DIAGNOSIS — E1149 Type 2 diabetes mellitus with other diabetic neurological complication: Secondary | ICD-10-CM

## 2016-03-06 DIAGNOSIS — M25532 Pain in left wrist: Secondary | ICD-10-CM

## 2016-03-06 MED ORDER — TRAMADOL HCL 50 MG PO TABS: ORAL_TABLET | ORAL | Status: DC

## 2016-03-06 NOTE — Telephone Encounter (Signed)
Pt uses [facility]. Kennedy Bohanon, [name], CMA

## 2016-03-06 NOTE — Progress Notes (Signed)
   Subjective:    Patient ID: [name] [name], female    DOB: 1950-11-06, 65 y.o.   MRN: [id number]  HPI Left wrist pain Had her cast removed for her wrist fracture involving both bones. They gave her brace but seems to be irritating her wrist. They did not give her any physical therapy appointment or instructions. She saw having quite a bit of pain.   Review of Systems No unusual weight change, fever, chills.    Objective:   Physical Exam Vital signs reviewed gen.: Well-developed female overweight, no acute distress EXTREMITY: Left wrist mild soft tissue swelling, no ecchymoses. Limited range of motion flexion extension. Supination not full. VASCULAR: Radial pulses 2+ bilaterally equal       Assessment & Plan:

## 2016-03-06 NOTE — Telephone Encounter (Signed)
Contacted pharmacy and called in Tramadol Rx per MD #[id number] with 5 refills. Katharina Caper, April D, [place]

## 2016-03-06 NOTE — Telephone Encounter (Signed)
Contacted pt on home and mobile and LVM for pt to call back, need to find out which pharmacy she wants her tramadol called into. Katharina Caper, Tywaun Hiltner D, [place]

## 2016-03-06 NOTE — Assessment & Plan Note (Signed)
New appropriate wrist splint given HEP program given. F/u 6 w

## 2016-03-07 ENCOUNTER — Ambulatory Visit (INDEPENDENT_AMBULATORY_CARE_PROVIDER_SITE_OTHER): Payer: Commercial Managed Care - HMO | Admitting: *Deleted

## 2016-03-07 DIAGNOSIS — J309 Allergic rhinitis, unspecified: Secondary | ICD-10-CM

## 2016-03-08 ENCOUNTER — Telehealth: Payer: Self-pay | Admitting: Internal Medicine

## 2016-03-08 ENCOUNTER — Ambulatory Visit (INDEPENDENT_AMBULATORY_CARE_PROVIDER_SITE_OTHER): Payer: Commercial Managed Care - HMO | Admitting: *Deleted

## 2016-03-08 DIAGNOSIS — J309 Allergic rhinitis, unspecified: Secondary | ICD-10-CM

## 2016-03-08 NOTE — Telephone Encounter (Signed)
Allergy Serum Extract Date Mixed: 03/08/16 Vial: 2 Strength: 1:10 Here/Mail/Pick Up: here Mixed By: tbs Last OV: 01/25/16 Pending OV: N/A

## 2016-03-14 ENCOUNTER — Ambulatory Visit (INDEPENDENT_AMBULATORY_CARE_PROVIDER_SITE_OTHER): Payer: Commercial Managed Care - HMO

## 2016-03-14 DIAGNOSIS — J309 Allergic rhinitis, unspecified: Secondary | ICD-10-CM | POA: Diagnosis not present

## 2016-03-21 ENCOUNTER — Ambulatory Visit: Payer: Self-pay

## 2016-03-25 DIAGNOSIS — S52552D Other extraarticular fracture of lower end of left radius, subsequent encounter for closed fracture with routine healing: Secondary | ICD-10-CM | POA: Diagnosis not present

## 2016-03-26 ENCOUNTER — Telehealth: Payer: Self-pay | Admitting: Family Medicine

## 2016-03-26 NOTE — Telephone Encounter (Signed)
Spoke w [name] [name] regarding her issues.

## 2016-03-28 ENCOUNTER — Ambulatory Visit (INDEPENDENT_AMBULATORY_CARE_PROVIDER_SITE_OTHER): Payer: Commercial Managed Care - HMO | Admitting: Psychiatry

## 2016-03-28 ENCOUNTER — Encounter (HOSPITAL_COMMUNITY): Payer: Self-pay | Admitting: Psychiatry

## 2016-03-28 ENCOUNTER — Ambulatory Visit (INDEPENDENT_AMBULATORY_CARE_PROVIDER_SITE_OTHER): Payer: Commercial Managed Care - HMO

## 2016-03-28 DIAGNOSIS — F331 Major depressive disorder, recurrent, moderate: Secondary | ICD-10-CM | POA: Diagnosis not present

## 2016-03-28 DIAGNOSIS — J309 Allergic rhinitis, unspecified: Secondary | ICD-10-CM

## 2016-03-28 DIAGNOSIS — F411 Generalized anxiety disorder: Secondary | ICD-10-CM

## 2016-03-28 MED ORDER — BUPROPION HCL 100 MG PO TABS: 100.0000 mg | ORAL_TABLET | Freq: Two times a day (BID) | ORAL | 0 refills | Status: DC

## 2016-03-28 MED ORDER — SERTRALINE HCL 100 MG PO TABS: ORAL_TABLET | ORAL | 0 refills | Status: DC

## 2016-03-28 NOTE — Progress Notes (Signed)
Patient ID: [name] [name], female   DOB: 10/22/1950, 65 y.o.   MRN: [id number] Patient ID: [name] [name], female   DOB: 25-Sep-1950, 65 y.o.   MRN: [id number]  [place] [address] Progress Note  [name] [name] [id number] 65 y.o.  03/28/2016 11:01 AM  Chief Complaint: not good MH wise  History of Present Illness: Son moved out and she no longer has much contact with him. It hurts and pt feels she has done a lot for him. Pt broke her wrist 2 weeks ago.   Pt continues to volunteer twice a week and really likes it. The rest of her time she goes to doctors appointments or doing something at home.   Reports about once a month or less she feels depressed due to stressors, lonliness and physical health. Pt has sad mood on those days. States she is unable to cry. Denies isolation, anhedonia, low motivation, worthlessness and hopelessness. Pt tries to keep herself busy. She often talks to friends on the phone but it is not the same as her son calling her to check on her. Denies AVH.   Sleep is fair and she uses her CPAP every night. Some nights it takes her hours to fall asleep.  Appetite is so/so she is eating a lot of sweets and carbs. Pt is trying to cut back. Pt is eating 1-2 meals a day.  Energy remains low. Concentration is ok.   Anxiety is mildly increased but she can't figure it out right now.    Pt is taking meds as prescribed and states Zoloft makes her relaxed and sleepy.   Suicidal Ideation: No Plan Formed: No Patient has means to carry out plan: No  Homicidal Ideation: No Plan Formed: No Patient has means to carry out plan: No  Review of Systems: Psychiatric: Agitation: No Hallucination: No Depressed Mood: Yes Insomnia: No Hypersomnia: No Altered Concentration: No Feels Worthless: No Grandiose Ideas: No Belief In Special Powers: No New/Increased Substance Abuse: No Compulsions: No  Review of Systems  Constitutional: Negative for chills,  fever and weight loss.  HENT: Negative for ear pain and sore throat.   Eyes: Positive for blurred vision. Negative for double vision and pain.  Respiratory: Negative for cough, shortness of breath and wheezing.   Cardiovascular: Positive for leg swelling. Negative for chest pain and palpitations.  Gastrointestinal: Negative for abdominal pain, constipation, heartburn, nausea and vomiting.  Musculoskeletal: Positive for back pain, joint pain and neck pain.  Skin: Negative for itching and rash.  Neurological: Positive for sensory change. Negative for dizziness, tremors, seizures, loss of consciousness and headaches.  Psychiatric/Behavioral: Positive for depression. Negative for hallucinations, substance abuse and suicidal ideas. The patient is nervous/anxious. The patient does not have insomnia.      Past Medical Family, Social History: She was born and raised in [place]. 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 Patient was born with bilateral congenital cataracts and had her first surgery at age 42 and her second surgery in 25. She finished high school and subsequently worked. She was never married and had her son at the age of 18 raised him as a single mother.Patient is on Social Security disability.  Social History   Social History  . Marital status: Single    Spouse name: N/A  . Number of children: 1  . Years of education: 57   Occupational History  . unemployed Unemployed   Social History Main Topics  . Smoking status: Former Smoker    Packs/day:  0.50    Years: 0.50    Types: Cigarettes    Quit date: 09/03/1975  . Smokeless tobacco: Never Used  . Alcohol use No  . Drug use: No  . Sexual activity: Not Currently   Other Topics Concern  . None   Social History Narrative   Approved for section 8 housing (12/03).         Health Care POA:    Emergency Contact: sister, catherine Daleo [phone]   End of Life Plan:    Who lives with you: Has a son who may be autistic and  stays with her at times.   Any pets: none   Diet: Pt has a varied diet of protein, starch, and vegetables.   Exercise: Pt walks 1x a week with [place] [employer] [facility] [employer].   Seatbelts: Pt reports wearing seatbelt when in vehicles.    Hobbies: walking, tv, church, eating         Family History  Problem Relation Age of Onset  . Diabetes Mother   . Colon cancer Neg Hx   . Depression Neg Hx   . Anxiety disorder Neg Hx     Past Medical History:  Diagnosis Date  . Allergy   . Angina   . Anxiety   . Arthritis   . Asthma   . Blood transfusion   . Blood transfusion without reported diagnosis   . Cataract   . Chronic cough    Now followed by pulmonary  . Chronic lower back pain   . Complication of anesthesia    "just wake up coughing; that's all"  . Concussion 11/18/11   "fell at dr's office; hit head"  . COPD (chronic obstructive pulmonary disease) ([place])   . Depression   . Diabetes mellitus type II    "take insulin & pills"  . Dyslipidemia   . Exertional dyspnea   . GERD (gastroesophageal reflux disease)   . Glaucoma   . Gout   . Headache(784.0) 11/18/11   "I've had mild headaches the last couple days"  . Headache(784.0) 01/08/12   "pretty constant since 11/18/11's concussion"  . Heart murmur   . History of colonoscopy   . HTN (hypertension)   . Myocardial infarction ([place])   . Osteoporosis   . Peripheral neuropathy ([place])   . Pneumonia    "i've had it once" (11/18/11)  . Sleep apnea    on CPAP 12  . Syncope 06/16/2012  . Vocal cord paralysis, unilateral partial    "right"    Outpatient Encounter Prescriptions as of 03/28/2016  Medication Sig  . ACCU-CHEK FASTCLIX LANCETS MISC TEST BLOOD SUGAR  TWO  TO THREE TIMES DAILY  . acyclovir (ZOVIRAX) 200 MG capsule Take 200 mg by mouth 2 (two) times daily.  . Alcohol Swabs (B-D SINGLE USE SWABS REGULAR) PADS USE THREE TIMES DAILY  . allopurinol (ZYLOPRIM) 300 MG tablet TAKE 1 TABLET EVERY DAY  . ammonium  lactate (LAC-HYDRIN) 12 % lotion Apply 1 application topically 2 (two) times daily as needed for dry skin.  [name] aspirin EC 81 MG tablet Take 81 mg by mouth daily.  [name] atorvastatin (LIPITOR) 40 MG tablet TAKE 1 TABLET EVERY DAY  . azelastine (ASTELIN) 0.1 % nasal spray Place 2 sprays into both nostrils daily. Use in each nostril as directed  . B-D ULTRAFINE III SHORT PEN 31G X 8 MM MISC USE THREE TIMES DAILY  . Blood Glucose Monitoring Suppl (ACCU-CHEK AVIVA PLUS) W/DEVICE KIT Use to check  blood sugar 3-4 times daily  . brimonidine (ALPHAGAN) 0.15 % ophthalmic solution Place 1 drop into both eyes 2 (two) times daily.   [name] buPROPion (WELLBUTRIN) 100 MG tablet Take 1 tablet (100 mg total) by mouth 2 (two) times daily.  . Dulaglutide (TRULICITY) 4.65 KP/5.4SF SOPN Inject 1.5 mg into the skin once a week.  . esomeprazole (NEXIUM) 40 MG capsule TAKE 1 CAPSULE ONE TIME DAILY  . FLOVENT HFA 110 MCG/ACT inhaler INHALE 2 PUFFS INTO THE LUNGS EVERY DAY  . furosemide (LASIX) 40 MG tablet TAKE 1 TABLET EVERY DAY (Patient taking differently: TAKE 1 TABLET BY MOUTH EVERY DAY)  . glucose blood test strip Test blood sugar 3-4 times daily  . Insulin Glargine (LANTUS SOLOSTAR) 100 UNIT/ML Solostar Pen Inject 50 units daily into skin for diabetes  . latanoprost (XALATAN) 0.005 % ophthalmic solution Place 1 drop into both eyes at bedtime.  [name] losartan (COZAAR) 50 MG tablet Take 1 tablet (50 mg total) by mouth daily.  . metoprolol succinate (TOPROL-XL) 50 MG 24 hr tablet Take 3 tablets (150 mg total) by mouth daily. Take with or immediately following a meal.  . montelukast (SINGULAIR) 10 MG tablet TAKE 1 TABLET EVERY DAY  . Multiple Vitamins-Minerals (MULTIVITAMIN PO) Take 1 tablet by mouth daily.  . nitroGLYCERIN (NITROSTAT) 0.4 MG SL tablet Place 1 tablet (0.4 mg total) under the tongue every 5 (five) minutes as needed for chest pain.  . NONFORMULARY OR COMPOUNDED ITEM Reported on 01/10/2016  . NOVOLOG FLEXPEN 100  UNIT/ML FlexPen INJECT 30 UNITS SUBCUTANEOUSLY WITH THE EARLY MEAL AND 30 UNITS WITH THE LATE MEAL  . salicylic acid 17 % gel Apply topically 2 (two) times daily. Apply small amount to callous area on bottom of foot BID  . sertraline (ZOLOFT) 100 MG tablet TAKE 1 AND 1/2 TABLETS AT BEDTIME  . spironolactone (ALDACTONE) 25 MG tablet TAKE 1 TABLET (25 MG TOTAL) BY MOUTH DAILY BEFORE SUPPER.  . traMADol (ULTRAM) 50 MG tablet Take by mouth 1 to 2 tablets every 8 hours as needed for pain max of 6 tabs 24 hours  . VENTOLIN HFA 108 (90 Base) MCG/ACT inhaler INHALE 2 PUFFS INTO THE LUNGS EVERY 6  HOURS AS NEEDED FOR WHEEZING OR SHORTNESS OF BREATH.  [name] Vitamin D, Ergocalciferol, (DRISDOL) 50000 UNITS CAPS capsule Take 1 capsule (50,000 Units total) by mouth every 30 (thirty) days. Take on 3rd of the month  . beclomethasone (QVAR) 40 MCG/ACT inhaler Inhale 2 puffs into the lungs daily. Reported on 02/14/2016  . EPINEPHrine 0.3 mg/0.3 mL IJ SOAJ injection Inject 0.3 mLs (0.3 mg total) into the muscle once as needed (for allergic reaction). (Patient not taking: Reported on 03/28/2016)  . linaclotide (LINZESS) 145 MCG CAPS capsule Take 145 mcg by mouth daily before breakfast. Reported on 02/14/2016   No facility-administered encounter medications on file as of 03/28/2016.     Past Psychiatric History/Hospitalization(s): Anxiety: Yes Bipolar Disorder: No Depression: Yes Mania: No Psychosis: No Schizophrenia: No Personality Disorder: No Hospitalization for psychiatric illness: No History of Electroconvulsive Shock Therapy: No Prior Suicide Attempts: No  Physical Exam: Constitutional:  BP 130/76   Pulse 81   Ht 5' 1"  (1.549 m)   Wt 203 lb (92.1 kg)   BMI 38.36 kg/m   General Appearance: alert, oriented, no acute distress  Musculoskeletal: Strength & Muscle Tone: decreased and walks with cane Gait & Station: unsteady Patient leans: Front  Mental Status Examination/Evaluation: Objective:  Attitude: Calm and cooperative  Appearance: Casual, appears to be stated age  Eye Contact::  Good has glaucoma and difficulty seeing despite using her glasses  Speech:  Clear and Coherent and Normal Rate  Volume:  Normal  Mood:  Depressed and anxious  Affect:  Congruent  Thought Process:  Goal Directed  Orientation:  Full (Time, Place, and Person)  Thought Content:  Negative  Suicidal Thoughts:  No  Homicidal Thoughts:  No  Judgement:  Good  Insight:  Good  Concentration: good  Memory: Immediate-good Recent-good Remote-good  Recall: fair  Language: fair  Gait and Station: normal  [employer] of Knowledge: average  Psychomotor Activity:  Normal  Akathisia:  No  Handed:  Right  AIMS (if indicated): n/a   Assets:  Communication Skills Desire for Improvement Housing Leisure Time Resilience Social Support         Assessment: MDD- recurrent, moderate; GAD  #1 Maj. depression recurrent chronic moderate. Wellbutrin  271m BID by mouth every morning. For constipation recommend fiber or laxative.   Zoloft 150 mg by mouth every a.m.  #2 generalized anxiety disorder. Wellbutrin and Zoloft Also discussed relaxations and deep breathing techniques with her and patient has agreed to practice them at home. States at some point she would like to get off Zoloft and Wellbutrin in the future.   #3 Multiple medical problems- Will be treated by her PCP Dr. SMallie Mussel  #4 nutrition- Discussed the importance of nutrition and psychoeducation regarding nutrition given her multiple health problems was discussed with her. Patient stated understanding and also encouraged her to walk a little more so that she moves.   Medication management with supportive therapy. Risks/benefits and SE of the medication discussed. Pt verbalized understanding and verbal consent obtained for treatment.  Affirm with the patient that the medications are taken as ordered. Patient expressed understanding of how  their medications were to be used.   Therapy: brief supportive therapy provided. Discussed psychosocial stressors in detail.    Labs: reviewed 02/14/2016 glu 114, HBA1c 7.6  Pt denies SI and is at an acute low risk for suicide.Patient told to call clinic if any problems occur. Patient advised to go to ER if they should develop SI/HI, side effects, or if symptoms worsen. Has crisis numbers to call if needed. Pt verbalized understanding.  F/up in 3 months or sooner if needed   SCharlcie Cradle MD 03/28/2016

## 2016-04-05 ENCOUNTER — Ambulatory Visit (INDEPENDENT_AMBULATORY_CARE_PROVIDER_SITE_OTHER): Payer: Commercial Managed Care - HMO | Admitting: *Deleted

## 2016-04-05 DIAGNOSIS — J309 Allergic rhinitis, unspecified: Secondary | ICD-10-CM | POA: Diagnosis not present

## 2016-04-12 ENCOUNTER — Ambulatory Visit: Payer: Self-pay

## 2016-04-12 ENCOUNTER — Ambulatory Visit (INDEPENDENT_AMBULATORY_CARE_PROVIDER_SITE_OTHER): Payer: Commercial Managed Care - HMO

## 2016-04-12 DIAGNOSIS — J309 Allergic rhinitis, unspecified: Secondary | ICD-10-CM | POA: Diagnosis not present

## 2016-04-16 ENCOUNTER — Telehealth: Payer: Self-pay | Admitting: Family Medicine

## 2016-04-16 NOTE — Telephone Encounter (Signed)
Pt would like Dr. Nori Riis to call her asap regarding her wrist. Please advise. Thanks! ep

## 2016-04-18 NOTE — Telephone Encounter (Addendum)
[address] [id number] [phone] [facility] ([facility])  spoke w Amany re her fall. She wants [facility] / [facility] to bear some responsibility for injury. I listened to her--not sure there is anything I can do. She needs to continue to discuss w Risk management.

## 2016-04-19 ENCOUNTER — Ambulatory Visit (INDEPENDENT_AMBULATORY_CARE_PROVIDER_SITE_OTHER): Payer: Commercial Managed Care - HMO | Admitting: *Deleted

## 2016-04-19 DIAGNOSIS — J309 Allergic rhinitis, unspecified: Secondary | ICD-10-CM | POA: Diagnosis not present

## 2016-04-26 ENCOUNTER — Ambulatory Visit (INDEPENDENT_AMBULATORY_CARE_PROVIDER_SITE_OTHER): Payer: Commercial Managed Care - HMO | Admitting: *Deleted

## 2016-04-26 DIAGNOSIS — J309 Allergic rhinitis, unspecified: Secondary | ICD-10-CM

## 2016-04-29 ENCOUNTER — Other Ambulatory Visit: Payer: Self-pay | Admitting: Family Medicine

## 2016-05-01 ENCOUNTER — Telehealth: Payer: Self-pay | Admitting: *Deleted

## 2016-05-01 NOTE — Telephone Encounter (Signed)
Will take care of it [name] [name]

## 2016-05-01 NOTE — Telephone Encounter (Signed)
Patient states that she was calling to remind provider to send transportation vouchers before 05-14-16.  She is supposed to be seeing Dr. Caralyn Guile then.  Also she wanted to send the [employer] gift cards.  She states that she needs these ASAP.  Please contact patient with any questions. Valine Drozdowski,CMA

## 2016-05-10 ENCOUNTER — Telehealth: Payer: Self-pay | Admitting: Family Medicine

## 2016-05-10 DIAGNOSIS — H547 Unspecified visual loss: Secondary | ICD-10-CM

## 2016-05-10 NOTE — Telephone Encounter (Signed)
Dear [name] [name] team, I have placed an envelope for her upfront. She can pick it up at her convenience. Hopefully will be helpful for her next week. I will place and referral for her ophthalmologist. Please let her know this.[name]

## 2016-05-10 NOTE — Telephone Encounter (Signed)
PT informed. Zimmerman Rumple, Rayni Nemitz D, CMA  

## 2016-05-10 NOTE — Telephone Encounter (Signed)
Pt has an appt with Dr Apolonio Schneiders (orthopedic specialist) on Monday Sept 11 at 2. Dr Nori Riis was suppose to send pt some vouchers and gift cards for that.  She hasn't received them yet.  She also needs another authorization for Dr Luetta Nutting, her opthalmologist She has an appt there in Oct.

## 2016-05-13 DIAGNOSIS — S52552D Other extraarticular fracture of lower end of left radius, subsequent encounter for closed fracture with routine healing: Secondary | ICD-10-CM | POA: Diagnosis not present

## 2016-05-17 ENCOUNTER — Ambulatory Visit (INDEPENDENT_AMBULATORY_CARE_PROVIDER_SITE_OTHER): Payer: Commercial Managed Care - HMO

## 2016-05-17 DIAGNOSIS — Z23 Encounter for immunization: Secondary | ICD-10-CM

## 2016-05-17 DIAGNOSIS — J309 Allergic rhinitis, unspecified: Secondary | ICD-10-CM | POA: Diagnosis not present

## 2016-05-24 ENCOUNTER — Ambulatory Visit: Payer: Self-pay

## 2016-05-28 ENCOUNTER — Telehealth: Payer: Self-pay | Admitting: *Deleted

## 2016-05-28 NOTE — Telephone Encounter (Signed)
Patient would like to speak with provider regarding her wrist pain.  States that she isn't due to see [facility] again until November and would like to know if she needs to follow up with them or you for this concern. Nesha Counihan,CMA

## 2016-05-29 NOTE — Telephone Encounter (Signed)
Dear [name] [name] Team Please tell her to follow up with me [facility]! [name]

## 2016-05-30 NOTE — Telephone Encounter (Signed)
Has appt 06/05/16. Aila Terra, [name], CMA

## 2016-05-31 ENCOUNTER — Ambulatory Visit (INDEPENDENT_AMBULATORY_CARE_PROVIDER_SITE_OTHER): Payer: Commercial Managed Care - HMO

## 2016-05-31 DIAGNOSIS — J309 Allergic rhinitis, unspecified: Secondary | ICD-10-CM

## 2016-06-05 ENCOUNTER — Ambulatory Visit: Payer: Self-pay | Admitting: Family Medicine

## 2016-06-05 ENCOUNTER — Other Ambulatory Visit: Payer: Self-pay | Admitting: Family Medicine

## 2016-06-05 ENCOUNTER — Telehealth: Payer: Self-pay | Admitting: Family Medicine

## 2016-06-05 DIAGNOSIS — Z1231 Encounter for screening mammogram for malignant neoplasm of breast: Secondary | ICD-10-CM

## 2016-06-05 DIAGNOSIS — Z13820 Encounter for screening for osteoporosis: Secondary | ICD-10-CM

## 2016-06-05 NOTE — Telephone Encounter (Signed)
Pt is calling to have a referral put in at the [place] so that she can have a bone density test. [name] [name]

## 2016-06-05 NOTE — Telephone Encounter (Signed)
Dear [name] Team [place] I ordered it---do you have to schedule it or do they call her? THANKS! [name] [name]

## 2016-06-05 NOTE — Telephone Encounter (Signed)
Tia I am confused---does she need a REFERRAL for a DEXXA or do I just order a DEXXA scan under radiology? Please let me know THANKS! [name]

## 2016-06-05 NOTE — Telephone Encounter (Signed)
Dr. Nori Riis,  You should just place an order for Dexxa scan into [facility]. She does not need an actual referral.   -Juwann Sherk

## 2016-06-06 ENCOUNTER — Other Ambulatory Visit: Payer: Self-pay | Admitting: Family Medicine

## 2016-06-06 DIAGNOSIS — E2839 Other primary ovarian failure: Secondary | ICD-10-CM

## 2016-06-06 NOTE — Telephone Encounter (Signed)
Pt informed, she will call to make appt. Ottis Stain, CMA

## 2016-06-07 ENCOUNTER — Ambulatory Visit (INDEPENDENT_AMBULATORY_CARE_PROVIDER_SITE_OTHER): Payer: Commercial Managed Care - HMO | Admitting: *Deleted

## 2016-06-07 DIAGNOSIS — J309 Allergic rhinitis, unspecified: Secondary | ICD-10-CM | POA: Diagnosis not present

## 2016-06-12 ENCOUNTER — Ambulatory Visit (INDEPENDENT_AMBULATORY_CARE_PROVIDER_SITE_OTHER): Payer: Commercial Managed Care - HMO | Admitting: Family Medicine

## 2016-06-12 ENCOUNTER — Encounter: Payer: Self-pay | Admitting: Family Medicine

## 2016-06-12 VITALS — BP 119/53 | HR 76 | Temp 97.7°F | Ht 61.0 in | Wt 201.0 lb

## 2016-06-12 DIAGNOSIS — E1149 Type 2 diabetes mellitus with other diabetic neurological complication: Secondary | ICD-10-CM

## 2016-06-12 DIAGNOSIS — S62102D Fracture of unspecified carpal bone, left wrist, subsequent encounter for fracture with routine healing: Secondary | ICD-10-CM

## 2016-06-12 DIAGNOSIS — E559 Vitamin D deficiency, unspecified: Secondary | ICD-10-CM | POA: Diagnosis not present

## 2016-06-12 DIAGNOSIS — L603 Nail dystrophy: Secondary | ICD-10-CM

## 2016-06-12 LAB — POCT GLYCOSYLATED HEMOGLOBIN (HGB A1C): Hemoglobin A1C: 8.8

## 2016-06-12 NOTE — Assessment & Plan Note (Signed)
Her hemoglobin A1c has crept up quite a bit. We'll keep the medications the same and decrease her ice cream intake. Follow-up 3 months.

## 2016-06-12 NOTE — Assessment & Plan Note (Signed)
I trimmed her nails today.

## 2016-06-12 NOTE — Progress Notes (Signed)
    CHIEF COMPLAINT / HPI:   1. Follow-up left wrist. Still having a lot of problems with picking up heavy items. 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 She occasionally lifts trays but they're usually not very heavy and she can get someone else to do that if she has to. She wraps bread. She really like to go back to her activities as she's getting bored at home #2. Having some discomfort in her right great toe secondary to long nails. She tried Them herself but she really can't see that well down there and she's afraid she cut her toe. #3. Diabetes mellitus follow-up. She's been eating more ice cream than she should. No episodes of low blood sugar but some of her sugars have been in the mid 200s. #4. Hypertension: Brings with her list of her blood pressure readings. She's had no chest pain, no shortness of breath  REVIEW OF SYSTEMS:  No unusual weight change, fever, sweats, chills. See history of present illness.  OBJECTIVE:  Vital signs are reviewed.   Vital signs reviewed. GENERAL: Well-developed, well-nourished, no acute distress. CARDIOVASCULAR: Regular rate and rhythm no murmur gallop or rub LUNGS: Clear to auscultation bilaterally, no rales or wheeze. ABDOMEN: Soft positive bowel sounds NEURO: Legally blind. Walks with use of a rolling walker. She can rise from a chair with minimal assistance of her walker. Stocking glove decreased soft touch sensation. MSK: Movement of extremity x 4. SKIN/NAILS: The right great toenail is extremely long. There is no sign of laceration on the toe.    ASSESSMENT / PLAN: Please see problem oriented charting for details

## 2016-06-12 NOTE — Assessment & Plan Note (Signed)
She has run out of her supplements I will refill that and check her vitamin D level in January.

## 2016-06-12 NOTE — Assessment & Plan Note (Signed)
She has pretty limited range of motion and wrist flexion. We discussed some exercises that she could do to improve this. 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 She agrees to modify her activities.

## 2016-06-14 ENCOUNTER — Ambulatory Visit (INDEPENDENT_AMBULATORY_CARE_PROVIDER_SITE_OTHER): Payer: Commercial Managed Care - HMO

## 2016-06-14 DIAGNOSIS — J309 Allergic rhinitis, unspecified: Secondary | ICD-10-CM

## 2016-06-17 DIAGNOSIS — H401133 Primary open-angle glaucoma, bilateral, severe stage: Secondary | ICD-10-CM | POA: Diagnosis not present

## 2016-06-17 DIAGNOSIS — H59011 Keratopathy (bullous aphakic) following cataract surgery, right eye: Secondary | ICD-10-CM | POA: Diagnosis not present

## 2016-06-18 ENCOUNTER — Other Ambulatory Visit: Payer: Self-pay | Admitting: Family Medicine

## 2016-06-18 DIAGNOSIS — E559 Vitamin D deficiency, unspecified: Secondary | ICD-10-CM

## 2016-06-18 MED ORDER — CHOLECALCIFEROL 1.25 MG (50000 UT) PO CAPS: ORAL_CAPSULE | ORAL | 3 refills | Status: DC

## 2016-06-20 ENCOUNTER — Telehealth: Payer: Self-pay | Admitting: Family Medicine

## 2016-06-20 NOTE — Telephone Encounter (Signed)
Pt needs to remind dr neal about her appt nov7 with dr Soundra Pilon at [facility].  Dr Nori Riis gets cab vouches for this trip and her [employer] cards   Also did dr neal called [facility] about her vit d.

## 2016-06-21 ENCOUNTER — Ambulatory Visit (INDEPENDENT_AMBULATORY_CARE_PROVIDER_SITE_OTHER): Payer: Commercial Managed Care - HMO

## 2016-06-21 DIAGNOSIS — J309 Allergic rhinitis, unspecified: Secondary | ICD-10-CM

## 2016-06-21 NOTE — Telephone Encounter (Signed)
Pt informed. Zimmerman Rumple, April D, CMA  

## 2016-06-21 NOTE — Telephone Encounter (Signed)
Dear [name] Team Yes I am working on her voucher and cards. I DID send the vitamin d in to [facility]. I should know something abut her voucher / card next week \\THANKS ! [name]

## 2016-06-23 ENCOUNTER — Other Ambulatory Visit (HOSPITAL_COMMUNITY): Payer: Self-pay | Admitting: Psychiatry

## 2016-06-23 DIAGNOSIS — F331 Major depressive disorder, recurrent, moderate: Secondary | ICD-10-CM

## 2016-06-23 DIAGNOSIS — F411 Generalized anxiety disorder: Secondary | ICD-10-CM

## 2016-06-27 ENCOUNTER — Other Ambulatory Visit (HOSPITAL_COMMUNITY): Payer: Self-pay

## 2016-06-27 DIAGNOSIS — F411 Generalized anxiety disorder: Secondary | ICD-10-CM

## 2016-06-27 DIAGNOSIS — F331 Major depressive disorder, recurrent, moderate: Secondary | ICD-10-CM

## 2016-06-27 MED ORDER — BUPROPION HCL 100 MG PO TABS: 100.0000 mg | ORAL_TABLET | Freq: Two times a day (BID) | ORAL | 0 refills | Status: DC

## 2016-06-28 ENCOUNTER — Ambulatory Visit (INDEPENDENT_AMBULATORY_CARE_PROVIDER_SITE_OTHER): Payer: Commercial Managed Care - HMO | Admitting: *Deleted

## 2016-06-28 DIAGNOSIS — J309 Allergic rhinitis, unspecified: Secondary | ICD-10-CM

## 2016-07-02 ENCOUNTER — Telehealth: Payer: Self-pay | Admitting: *Deleted

## 2016-07-02 NOTE — Telephone Encounter (Signed)
Per Dr. Nori Riis - Patient has appt with Antionette Char (Dr. Caralyn Guile) on 07/09/16 at 10:45 am.  Mailed 2 taxi vouchers to assist patient with round-trip visit from home to doctor's appt.  Will email copy of vouchers per their request to [email or web address] (attn. [email or web address]) and they will assist patient with transportation.  Burna Forts, BSN, RN-BC

## 2016-07-03 ENCOUNTER — Ambulatory Visit: Payer: Self-pay

## 2016-07-03 ENCOUNTER — Inpatient Hospital Stay: Admission: RE | Admit: 2016-07-03 | Payer: Self-pay | Source: Ambulatory Visit

## 2016-07-04 ENCOUNTER — Ambulatory Visit (HOSPITAL_COMMUNITY): Payer: Self-pay | Admitting: Psychiatry

## 2016-07-04 ENCOUNTER — Telehealth: Payer: Self-pay | Admitting: Family Medicine

## 2016-07-04 NOTE — Telephone Encounter (Signed)
Pt says she has an appointment on Tuesday at [place] and wants to know if she can pick up the vouchers and the [employer] card before then. Please advise. Thanks! ep

## 2016-07-07 ENCOUNTER — Other Ambulatory Visit (HOSPITAL_COMMUNITY): Payer: Self-pay | Admitting: Psychiatry

## 2016-07-07 DIAGNOSIS — F331 Major depressive disorder, recurrent, moderate: Secondary | ICD-10-CM

## 2016-07-07 DIAGNOSIS — F411 Generalized anxiety disorder: Secondary | ICD-10-CM

## 2016-07-08 NOTE — Telephone Encounter (Signed)
Dear [name] Team Please let her know they bitgh have been maikled already [facility]! [name] [name]

## 2016-07-08 NOTE — Telephone Encounter (Signed)
Spoke to pt. She received one on Fri and one on Saturday. She asked me to tell Dr. Nori Riis, "Thank you so much!, thank you, thank you!." Ottis Stain, CMA

## 2016-07-09 DIAGNOSIS — S52552D Other extraarticular fracture of lower end of left radius, subsequent encounter for closed fracture with routine healing: Secondary | ICD-10-CM | POA: Diagnosis not present

## 2016-07-10 ENCOUNTER — Telehealth (HOSPITAL_COMMUNITY): Payer: Self-pay

## 2016-07-10 NOTE — Telephone Encounter (Signed)
Patients pharmacy called checking on a refill, I noted that the patient was last here in July, she cancelled her appointment for 11/2 and is now scheduled for February. They are asking for a refill on the Zoloft, last sent in on 7/27 for a 90 day supply. Please review and advise, thank you

## 2016-07-11 ENCOUNTER — Other Ambulatory Visit (HOSPITAL_COMMUNITY): Payer: Self-pay

## 2016-07-11 DIAGNOSIS — F331 Major depressive disorder, recurrent, moderate: Secondary | ICD-10-CM

## 2016-07-11 DIAGNOSIS — F411 Generalized anxiety disorder: Secondary | ICD-10-CM

## 2016-07-11 MED ORDER — SERTRALINE HCL 100 MG PO TABS: ORAL_TABLET | ORAL | 0 refills | Status: DC

## 2016-07-11 NOTE — Telephone Encounter (Signed)
Yes refill it.

## 2016-07-16 ENCOUNTER — Other Ambulatory Visit: Payer: Self-pay | Admitting: Family Medicine

## 2016-07-16 ENCOUNTER — Ambulatory Visit (INDEPENDENT_AMBULATORY_CARE_PROVIDER_SITE_OTHER): Payer: Commercial Managed Care - HMO | Admitting: *Deleted

## 2016-07-16 DIAGNOSIS — J309 Allergic rhinitis, unspecified: Secondary | ICD-10-CM

## 2016-07-30 ENCOUNTER — Ambulatory Visit (INDEPENDENT_AMBULATORY_CARE_PROVIDER_SITE_OTHER): Payer: Commercial Managed Care - HMO | Admitting: *Deleted

## 2016-07-30 DIAGNOSIS — J309 Allergic rhinitis, unspecified: Secondary | ICD-10-CM | POA: Diagnosis not present

## 2016-07-31 ENCOUNTER — Other Ambulatory Visit: Payer: Self-pay | Admitting: Family Medicine

## 2016-08-01 ENCOUNTER — Other Ambulatory Visit: Payer: Self-pay | Admitting: *Deleted

## 2016-08-01 DIAGNOSIS — F411 Generalized anxiety disorder: Secondary | ICD-10-CM

## 2016-08-01 DIAGNOSIS — F331 Major depressive disorder, recurrent, moderate: Secondary | ICD-10-CM

## 2016-08-04 MED ORDER — ACCU-CHEK FASTCLIX LANCETS MISC: 5 refills | Status: DC

## 2016-08-04 MED ORDER — SERTRALINE HCL 100 MG PO TABS: ORAL_TABLET | ORAL | 3 refills | Status: DC

## 2016-08-04 MED ORDER — BUPROPION HCL 100 MG PO TABS: 100.0000 mg | ORAL_TABLET | Freq: Two times a day (BID) | ORAL | 5 refills | Status: DC

## 2016-08-06 ENCOUNTER — Ambulatory Visit
Admission: RE | Admit: 2016-08-06 | Discharge: 2016-08-06 | Disposition: A | Discharge status: Home / Self Care | Payer: Commercial Managed Care - HMO | Source: Ambulatory Visit | Attending: Family Medicine | Admitting: Family Medicine

## 2016-08-06 ENCOUNTER — Encounter: Payer: Self-pay | Admitting: Family Medicine

## 2016-08-06 DIAGNOSIS — Z13820 Encounter for screening for osteoporosis: Secondary | ICD-10-CM | POA: Diagnosis not present

## 2016-08-06 DIAGNOSIS — Z780 Asymptomatic menopausal state: Secondary | ICD-10-CM | POA: Diagnosis not present

## 2016-08-06 DIAGNOSIS — E2839 Other primary ovarian failure: Secondary | ICD-10-CM

## 2016-08-06 DIAGNOSIS — Z1231 Encounter for screening mammogram for malignant neoplasm of breast: Secondary | ICD-10-CM

## 2016-08-07 ENCOUNTER — Telehealth: Payer: Self-pay | Admitting: Internal Medicine

## 2016-08-07 ENCOUNTER — Ambulatory Visit (INDEPENDENT_AMBULATORY_CARE_PROVIDER_SITE_OTHER): Payer: Commercial Managed Care - HMO | Admitting: *Deleted

## 2016-08-07 DIAGNOSIS — J309 Allergic rhinitis, unspecified: Secondary | ICD-10-CM | POA: Diagnosis not present

## 2016-08-07 NOTE — Telephone Encounter (Signed)
lmtcb x1 for pt. 

## 2016-08-07 NOTE — Telephone Encounter (Signed)
Patient returning phone call 

## 2016-08-07 NOTE — Telephone Encounter (Signed)
Spoke with pt. She is needing to know where the other allergy clinics are located due to her having to take public transportation. I have advised her of where the clinics are located. [facility] Nothing further was needed at this time.

## 2016-08-07 NOTE — Telephone Encounter (Signed)
Gave patient [place] Number

## 2016-08-14 ENCOUNTER — Ambulatory Visit: Payer: Self-pay

## 2016-08-14 ENCOUNTER — Ambulatory Visit (INDEPENDENT_AMBULATORY_CARE_PROVIDER_SITE_OTHER): Payer: Commercial Managed Care - HMO

## 2016-08-14 DIAGNOSIS — J309 Allergic rhinitis, unspecified: Secondary | ICD-10-CM | POA: Diagnosis not present

## 2016-08-19 ENCOUNTER — Ambulatory Visit (INDEPENDENT_AMBULATORY_CARE_PROVIDER_SITE_OTHER): Payer: Commercial Managed Care - HMO | Admitting: Internal Medicine

## 2016-08-19 ENCOUNTER — Encounter: Payer: Self-pay | Admitting: Internal Medicine

## 2016-08-19 VITALS — BP 124/62 | HR 79 | Ht 61.0 in | Wt 199.8 lb

## 2016-08-19 DIAGNOSIS — R011 Cardiac murmur, unspecified: Secondary | ICD-10-CM | POA: Diagnosis not present

## 2016-08-19 NOTE — Patient Instructions (Signed)
Your physician recommends that you continue on your current medications as directed. Please refer to the Current Medication list given to you today.  Your physician has requested that you have an echocardiogram. Echocardiography is a painless test that uses sound waves to create images of your heart. It provides your doctor with information about the size and shape of your heart and how well your heart's chambers and valves are working. This procedure takes approximately one hour. There are no restrictions for this procedure.  Your physician wants you to follow-up in: 9-12 months with Dr. Harrington Challenger.  You will receive a reminder letter in the mail two months in advance. If you don't receive a letter, please call our office to schedule the follow-up appointment.

## 2016-08-19 NOTE — Progress Notes (Signed)
Cardiology Office Note   Date:  08/19/2016   ID:  [name], [name] [birth year], MRN [id number]  PCP:  [name], MD  Cardiologist:   Dorris Carnes, MD   F?U of HTN    History of Present Illness: [name] [name] is a 65 y.o. female with a history of HTN and HL  I saw her in July 2016  Cth in 2015 Minimal CAD  Mild elevation of PAP  LVEf normal    Since seen her breathing has been OK  No CP  Still with some LE edema      Current Meds  Medication Sig  . ACCU-CHEK FASTCLIX LANCETS MISC TEST BLOOD SUGAR  TWO  TO THREE TIMES DAILY  . acyclovir (ZOVIRAX) 200 MG capsule Take 200 mg by mouth 2 (two) times daily.  . Alcohol Swabs (B-D SINGLE USE SWABS REGULAR) PADS USE THREE TIMES DAILY  . allopurinol (ZYLOPRIM) 300 MG tablet TAKE 1 TABLET EVERY DAY  . ammonium lactate (LAC-HYDRIN) 12 % lotion Apply 1 application topically 2 (two) times daily as needed for dry skin.  [name] aspirin EC 81 MG tablet Take 81 mg by mouth daily.  [name] atorvastatin (LIPITOR) 40 MG tablet TAKE 1 TABLET EVERY DAY  . azelastine (ASTELIN) 0.1 % nasal spray USE 2 SPRAYS IN EACH NOSTRIL EVERY DAY AS NEEDED AS DIRECTED  . B-D ULTRAFINE III SHORT PEN 31G X 8 MM MISC USE THREE TIMES DAILY  . Blood Glucose Monitoring Suppl (ACCU-CHEK AVIVA PLUS) W/DEVICE KIT Use to check blood sugar 3-4 times daily  . buPROPion (WELLBUTRIN) 100 MG tablet Take 1 tablet (100 mg total) by mouth 2 (two) times daily.  . Cholecalciferol 50000 units capsule Take one by mouth weekly  . COMBIGAN 0.2-0.5 % ophthalmic solution Place 1 drop into the left eye every 12 (twelve) hours.   . Dulaglutide (TRULICITY) 2.35 TI/1.4ER SOPN Inject 1.5 mg into the skin once a week.  [name] EPINEPHrine 0.3 mg/0.3 mL IJ SOAJ injection Inject 0.3 mLs (0.3 mg total) into the muscle once as needed (for allergic reaction).  [name] esomeprazole (NEXIUM) 40 MG capsule TAKE 1 CAPSULE ONE TIME DAILY  . FLOVENT HFA 110 MCG/ACT inhaler INHALE 2 PUFFS INTO THE LUNGS EVERY  DAY  . furosemide (LASIX) 40 MG tablet TAKE 1 TABLET EVERY DAY  . glucose blood test strip Test blood sugar 3-4 times daily  . Insulin Glargine (LANTUS SOLOSTAR) 100 UNIT/ML Solostar Pen Inject 50 units daily into skin for diabetes  . latanoprost (XALATAN) 0.005 % ophthalmic solution Place 1 drop into both eyes at bedtime.  [name] linaclotide (LINZESS) 145 MCG CAPS capsule Take 290 mcg by mouth daily before breakfast. Reported on 02/14/2016  . losartan (COZAAR) 50 MG tablet Take 1 tablet (50 mg total) by mouth daily.  . metoprolol succinate (TOPROL-XL) 50 MG 24 hr tablet Take 3 tablets (150 mg total) by mouth daily. Take with or immediately following a meal.  . montelukast (SINGULAIR) 10 MG tablet TAKE 1 TABLET EVERY DAY  . Multiple Vitamins-Minerals (MULTIVITAMIN PO) Take 1 tablet by mouth daily.  . nitroGLYCERIN (NITROSTAT) 0.4 MG SL tablet Place 1 tablet (0.4 mg total) under the tongue every 5 (five) minutes as needed for chest pain.  [name] NOVOLOG FLEXPEN 100 UNIT/ML FlexPen INJECT 30 UNITS SUBCUTANEOUSLY WITH THE EARLY MEAL AND 30 UNITS WITH THE LATE MEAL  . salicylic acid 17 % gel Apply topically 2 (two) times daily. Apply small amount to callous area on bottom of  foot BID  . sertraline (ZOLOFT) 100 MG tablet TAKE 1 AND 1/2 TABLETS AT BEDTIME  . spironolactone (ALDACTONE) 25 MG tablet TAKE 1 TABLET EVERY DAY BEFORE SUPPER  . traMADol (ULTRAM) 50 MG tablet Take by mouth 1 to 2 tablets every 8 hours as needed for pain max of 6 tabs 24 hours  . VENTOLIN HFA 108 (90 Base) MCG/ACT inhaler INHALE 2 PUFFS INTO THE LUNGS EVERY 6  HOURS AS NEEDED FOR WHEEZING OR SHORTNESS OF BREATH.     Allergies:   Bee venom; Propoxyphene hcl; Amlodipine besylate; Hydrocodone; Lisinopril; Metformin and related; Metronidazole; and Valsartan   Past Medical History:  Diagnosis Date  . Allergy   . Angina   . Anxiety   . Arthritis   . Asthma   . Blood transfusion   . Blood transfusion without reported diagnosis   .  Cataract   . Chronic cough    Now followed by pulmonary  . Chronic lower back pain   . Complication of anesthesia    "just wake up coughing; that's all"  . Concussion 11/18/11   "fell at dr's office; hit head"  . COPD (chronic obstructive pulmonary disease) ([place])   . Depression   . Diabetes mellitus type II    "take insulin & pills"  . Dyslipidemia   . Exertional dyspnea   . GERD (gastroesophageal reflux disease)   . Glaucoma   . Gout   . Headache(784.0) 11/18/11   "I've had mild headaches the last couple days"  . Headache(784.0) 01/08/12   "pretty constant since 11/18/11's concussion"  . Heart murmur   . History of colonoscopy   . HTN (hypertension)   . Myocardial infarction   . Osteoporosis   . Peripheral neuropathy ([place])   . Pneumonia    "i've had it once" (11/18/11)  . Sleep apnea    on CPAP 12  . Syncope 06/16/2012  . Vocal cord paralysis, unilateral partial    "right"    Past Surgical History:  Procedure Laterality Date  . ANTERIOR CERVICAL DECOMP/DISCECTOMY FUSION  01/15/2012   Procedure: ANTERIOR CERVICAL DECOMPRESSION/DISCECTOMY FUSION 3 LEVELS;  Surgeon: Eustace Moore, MD;  Location: [facility];  Service: Neurosurgery;  Laterality: N/A;  Anterior Cervical Decompression Discectomy Fusion Cerivcal three four, cervical four five, cervical five six, cervical six seven  . BREAST BIOPSY     bilaterally  . BREAST CYST EXCISION     right twice; left once  . BREAST REDUCTION SURGERY  04-21-2003  . BRONCHOSCOPY  07-2007  . CARDIAC CATHETERIZATION  03-02-2004  . CARPAL TUNNEL RELEASE     left  . CATARACT EXTRACTION  1955; 1979   bilateral; right eye  . CESAREAN SECTION  1987  . COLONOSCOPY W/ BIOPSIES  11/21/2010   adenoma polyp--no hi grade dysplasia Dr Collene Mares  . EYE SURGERY    . KNEE ARTHROSCOPY  08/2000   right  . LEFT AND RIGHT HEART CATHETERIZATION WITH CORONARY ANGIOGRAM N/A 01/19/2014   Procedure: LEFT AND RIGHT HEART CATHETERIZATION WITH CORONARY ANGIOGRAM;   Surgeon: Sinclair Grooms, MD;  Location: [facility];  Service: Cardiovascular;  Laterality: N/A;  . SHOULDER ARTHROSCOPY  08/2000   right  . SHOULDER ARTHROSCOPY W/ ROTATOR CUFF REPAIR  10/18/2003   left  . SPINE SURGERY    . T-score  09/02/04   -0.54 (low risk currently)  . TOTAL ABDOMINAL HYSTERECTOMY  10-07-2000  . TREATMENT FISTULA ANAL    . [place]  Social History:  The patient  reports that she quit smoking about 40 years ago. Her smoking use included Cigarettes. She has a 0.25 pack-year smoking history. She has never used smokeless tobacco. She reports that she does not drink alcohol or use drugs.   Family History:  The patient's family history includes Diabetes in her mother.    ROS:  Please see the history of present illness. All other systems are reviewed and  Negative to the above problem except as noted.    PHYSICAL EXAM: VS:  BP 124/62   Pulse 79   Ht 5' 1" (1.549 m)   Wt 199 lb 12.8 oz (90.6 kg)   BMI 37.75 kg/m   GEN: Well nourished, well developed, in no acute distress  HEENT: normal  Neck: no JVD, carotid bruits, or masses Cardiac: RRR; Gr II/VI systolic murmur base  , rubs, or gallops,Tr edema  Respiratory:  clear to auscultation bilaterally, normal work of breathing GI: soft, nontender, nondistended, + BS  No hepatomegaly  MS: no deformity Moving all extremities   Skin: warm and dry, no rash Neuro:  Strength and sensation are intact Psych: euthymic mood, full affect   EKG:  EKG is not  ordered today.   Lipid Panel    Component Value Date/Time   CHOL 121 (L) 10/20/2015 1212   TRIG 82 10/20/2015 1212   HDL 33 (L) 10/20/2015 1212   CHOLHDL 3.7 10/20/2015 1212   VLDL 16 10/20/2015 1212   LDLCALC 72 10/20/2015 1212   LDLDIRECT 112 (H) 02/09/2014 1216      Wt Readings from Last 3 Encounters:  08/19/16 199 lb 12.8 oz (90.6 kg)  06/12/16 201 lb (91.2 kg)  03/06/16 205 lb 6.4 oz (93.2 kg)      ASSESSMENT AND PLAN: 1  HTN  BP is  OK  2  HL  Pt to have labs drawn soon at primary MD  Asked her to forward  COntinue lipitor  3  Aortic sclerosis  WIll repeat echo  Last was in 2013  4  Minimal CAD  Continue current meds       Current medicines are reviewed at length with the patient today.  The patient does not have concerns regarding medicines.  Signed, Dorris Carnes, MD  08/19/2016 4:45 PM    [facility] [place], [place], [place]  [address] Phone: [phone]; Fax: [phone]

## 2016-08-21 ENCOUNTER — Ambulatory Visit: Payer: Self-pay

## 2016-08-22 ENCOUNTER — Telehealth: Payer: Self-pay | Admitting: Family Medicine

## 2016-08-22 DIAGNOSIS — J301 Allergic rhinitis due to pollen: Secondary | ICD-10-CM

## 2016-08-22 NOTE — Telephone Encounter (Signed)
Order entered

## 2016-08-22 NOTE — Telephone Encounter (Signed)
Pt is calling because she needs a referral to a new doctor for her allergies Dr. Tina Griffiths for next year. jw

## 2016-08-28 ENCOUNTER — Ambulatory Visit (INDEPENDENT_AMBULATORY_CARE_PROVIDER_SITE_OTHER): Payer: Commercial Managed Care - HMO | Admitting: *Deleted

## 2016-08-28 DIAGNOSIS — J309 Allergic rhinitis, unspecified: Secondary | ICD-10-CM

## 2016-09-11 ENCOUNTER — Encounter: Payer: Self-pay | Admitting: Family Medicine

## 2016-09-11 ENCOUNTER — Ambulatory Visit (HOSPITAL_COMMUNITY): Payer: Medicare HMO | Attending: Cardiovascular Disease

## 2016-09-11 ENCOUNTER — Ambulatory Visit (INDEPENDENT_AMBULATORY_CARE_PROVIDER_SITE_OTHER): Payer: Medicare HMO | Admitting: Family Medicine

## 2016-09-11 ENCOUNTER — Other Ambulatory Visit: Payer: Self-pay

## 2016-09-11 VITALS — BP 130/78 | HR 76 | Temp 97.7°F | Ht 61.0 in | Wt 201.8 lb

## 2016-09-11 DIAGNOSIS — I10 Essential (primary) hypertension: Secondary | ICD-10-CM

## 2016-09-11 DIAGNOSIS — E785 Hyperlipidemia, unspecified: Secondary | ICD-10-CM

## 2016-09-11 DIAGNOSIS — R269 Unspecified abnormalities of gait and mobility: Secondary | ICD-10-CM

## 2016-09-11 DIAGNOSIS — E1149 Type 2 diabetes mellitus with other diabetic neurological complication: Secondary | ICD-10-CM

## 2016-09-11 DIAGNOSIS — R011 Cardiac murmur, unspecified: Secondary | ICD-10-CM | POA: Diagnosis not present

## 2016-09-11 DIAGNOSIS — F3340 Major depressive disorder, recurrent, in remission, unspecified: Secondary | ICD-10-CM

## 2016-09-11 DIAGNOSIS — S62102D Fracture of unspecified carpal bone, left wrist, subsequent encounter for fracture with routine healing: Secondary | ICD-10-CM | POA: Diagnosis not present

## 2016-09-11 DIAGNOSIS — E119 Type 2 diabetes mellitus without complications: Secondary | ICD-10-CM | POA: Insufficient documentation

## 2016-09-11 DIAGNOSIS — J449 Chronic obstructive pulmonary disease, unspecified: Secondary | ICD-10-CM | POA: Diagnosis not present

## 2016-09-11 DIAGNOSIS — L603 Nail dystrophy: Secondary | ICD-10-CM

## 2016-09-11 DIAGNOSIS — Z87891 Personal history of nicotine dependence: Secondary | ICD-10-CM | POA: Insufficient documentation

## 2016-09-11 DIAGNOSIS — E559 Vitamin D deficiency, unspecified: Secondary | ICD-10-CM

## 2016-09-11 LAB — ECHOCARDIOGRAM COMPLETE
Height: 61 in
Weight: 3228.8 oz

## 2016-09-11 LAB — POCT GLYCOSYLATED HEMOGLOBIN (HGB A1C): Hemoglobin A1C: 8.7

## 2016-09-11 MED ORDER — SALICYLIC ACID 17 % EX GEL: Freq: Two times a day (BID) | CUTANEOUS | 3 refills | Status: DC

## 2016-09-11 MED ORDER — AMMONIUM LACTATE 12 % EX LOTN: 1.0000 "application " | TOPICAL_LOTION | Freq: Two times a day (BID) | CUTANEOUS | 12 refills | Status: DC | PRN

## 2016-09-11 NOTE — Patient Instructions (Signed)
Great to see you!   

## 2016-09-11 NOTE — Progress Notes (Signed)
    CHIEF COMPLAINT / HPI:   1. Follow-up diabetes mellitus: Taken her medicines regular without any problems. On occasion she will increase her Lantus dose as well as her NovoLog dose if her sugars running high. She's been doing this for about the last month in an effort to get better control. She does not want to officially change the dose however. #2. Has some toe and toenail pain. Has not been able to get to the podiatrist for regular nail clipping for several weeks. She can't really do her own because she can't see her toenails secondary to her blindness. #3. Continues to have a lot of peripheral neuropathy pain. We had tried increasing her gabapentin and didn't seem to be helping so she finally just stopped it totally. Seems no worse since she's been off it. #4. Still has a lot of calcification formation on her foot. The Lac-Hydrin lotion seem to help. She's also out the salicylic acid that she was using on the large callus on her left metatarsal area and needs a refill of that. She's not sure that's working very well. She's just about due to go back to podiatry to get a new pair of custom diabetic shoes as her current ones are loosening / showing wear  REVIEW OF SYSTEMS:  Denies fever, no unusual weight change. No unusual appetite change. Energy level is at baseline. Her mood is actually been quite good. No episodes of low blood sugar. Her high blood sugars are in this high 200s or low 300s. No urinary frequency. For additional pertinent review of systems see history of present illness above.  OBJECTIVE:  Vital signs are reviewed.  GENERAL: Well-developed, well-nourished, no acute distress. Mobility: Using wheeled walker. Vision: She can ambulate around the clinic with some assistance and she can see some shapes and light and dark. CARDIOVASCULAR: Regular rate and rhythm no murmur gallop or rub LUNGS: Clear to auscultation bilaterally, no rales or wheeze. ABDOMEN: Soft positive bowel  sounds. There is no tenderness, no rebound, no guarding NEURO: No gross focal neurological deficits other than decreased vision, mild decreased bilateral hearing. MSK: Movement of extremity x 4. She rises from a chair with some assistance, minimal, from the walker. She ambulates with a walker in a slightly stooped posture. She's just a little unsteady with her gait when she first stands up but then seems to gain her balance. FEET: Bilaterally there is no sign of pressure ulcer. She does have fairly large callus on the left third fourth metatarsal area is mildly tender. She has minimal callus formation on her heels. Decreased sensation to soft touch throughout bilateral feet on the dorsum and plantar surfaces extending about midway up each lower leg. Her nails are quite thickened and curved. I trimmed her nails today.    ASSESSMENT / PLAN: Please see problem oriented charting for details

## 2016-09-12 LAB — LDL CHOLESTEROL, DIRECT: Direct LDL: 104 mg/dL (ref <130)

## 2016-09-12 LAB — COMPREHENSIVE METABOLIC PANEL
ALT: 38 U/L — ABNORMAL HIGH (ref 6–29)
AST: 48 U/L — ABNORMAL HIGH (ref 10–35)
Albumin: 3.8 g/dL (ref 3.6–5.1)
Alkaline Phosphatase: 110 U/L (ref 33–130)
BUN: 13 mg/dL (ref 7–25)
CO2: 31 mmol/L (ref 20–31)
Calcium: 9.4 mg/dL (ref 8.6–10.4)
Chloride: 102 mmol/L (ref 98–110)
Creat: 0.89 mg/dL (ref 0.50–0.99)
Glucose, Bld: 197 mg/dL — ABNORMAL HIGH (ref 65–99)
Potassium: 4.1 mmol/L (ref 3.5–5.3)
Sodium: 141 mmol/L (ref 135–146)
Total Bilirubin: 0.3 mg/dL (ref 0.2–1.2)
Total Protein: 7.5 g/dL (ref 6.1–8.1)

## 2016-09-12 NOTE — Assessment & Plan Note (Signed)
She's actually doing pre-well using the rolling walker. We briefly discussed additional safety measures.

## 2016-09-12 NOTE — Assessment & Plan Note (Addendum)
Discussed dosing of her medication. She wants to keep it as is right now. In reality she's doing her own sliding scale although she refuses to call at that. In October her A1c was 8.8. Recheck today. Also recheck kidney function other labs appropriate.

## 2016-09-12 NOTE — Assessment & Plan Note (Signed)
Her mood-she seems the best of they have seen her in a while. We'll continue current medications. She can use to follow-up intermittently with psychiatry.

## 2016-09-12 NOTE — Assessment & Plan Note (Signed)
I trimmed her nails today.

## 2016-09-12 NOTE — Assessment & Plan Note (Signed)
She's not having any symptoms right now. We'll continue her baseline medications unchanged. Watch for any exacerbation with winter weather.

## 2016-09-16 ENCOUNTER — Encounter: Payer: Self-pay | Admitting: Family Medicine

## 2016-09-23 ENCOUNTER — Telehealth: Payer: Self-pay | Admitting: Internal Medicine

## 2016-09-23 NOTE — Telephone Encounter (Signed)
Echo shows normal pumping function of the heart The aortic valve is miniamlly narrowed  No change from previous echo LDL is 104  It should be lower   I would recomm adding Zetia to meds  COntine lipitor Zetia 10 mg per day   F/U lipdids in 8 wks.

## 2016-09-23 NOTE — Telephone Encounter (Signed)
Follow Up:    Pt would like her echo results from 09-11-16 please.

## 2016-09-23 NOTE — Telephone Encounter (Signed)
Reviewed echo results with patient.  She had labs drawn at her PCP on 09/11/16 (Lipids).    Informed patient that Dr. Harrington Challenger will review and if any recommended changes we will call her back.  She is appreciative for the call back.

## 2016-09-25 ENCOUNTER — Telehealth: Payer: Self-pay | Admitting: Family Medicine

## 2016-09-25 NOTE — Telephone Encounter (Signed)
Please call patient to clarify what lotion and get is asking about?  Derl Barrow, RN

## 2016-09-25 NOTE — Telephone Encounter (Signed)
Pt received the lotion from [facility], but they did not have the gel. Pt needs an alternative for the gel sent to [facility]. ep

## 2016-10-02 ENCOUNTER — Other Ambulatory Visit: Payer: Self-pay | Admitting: Family Medicine

## 2016-10-02 DIAGNOSIS — I10 Essential (primary) hypertension: Secondary | ICD-10-CM

## 2016-10-02 MED ORDER — EZETIMIBE 10 MG PO TABS: 10.0000 mg | ORAL_TABLET | Freq: Every day | ORAL | 3 refills | Status: DC

## 2016-10-02 NOTE — Telephone Encounter (Signed)
Informed patient.  She will start Zetia 10 mg once a day, in addition to Lipitor.  Will send a script by mail for repeat lipids to be done at her PCP office in 8 weeks with results faxed to Dr. Harrington Challenger.

## 2016-10-03 ENCOUNTER — Other Ambulatory Visit: Payer: Self-pay | Admitting: Family Medicine

## 2016-10-03 NOTE — Telephone Encounter (Signed)
Dear [name] Team Can u call her tramadol in w 5 refills THANKS! [name] [name]

## 2016-10-03 NOTE — Telephone Encounter (Signed)
Medication called into pharmacy.  Shavon Lovena Le sma

## 2016-10-05 ENCOUNTER — Other Ambulatory Visit: Payer: Self-pay | Admitting: Family Medicine

## 2016-10-09 ENCOUNTER — Encounter: Payer: Self-pay | Admitting: Allergy and Immunology

## 2016-10-09 ENCOUNTER — Encounter (INDEPENDENT_AMBULATORY_CARE_PROVIDER_SITE_OTHER): Payer: Self-pay

## 2016-10-09 ENCOUNTER — Ambulatory Visit (INDEPENDENT_AMBULATORY_CARE_PROVIDER_SITE_OTHER): Payer: Commercial Managed Care - HMO | Admitting: Allergy and Immunology

## 2016-10-09 VITALS — BP 120/70 | HR 72 | Resp 16 | Ht 61.0 in | Wt 201.0 lb

## 2016-10-09 DIAGNOSIS — J4540 Moderate persistent asthma, uncomplicated: Secondary | ICD-10-CM | POA: Diagnosis not present

## 2016-10-09 DIAGNOSIS — J3089 Other allergic rhinitis: Secondary | ICD-10-CM

## 2016-10-09 DIAGNOSIS — K219 Gastro-esophageal reflux disease without esophagitis: Secondary | ICD-10-CM | POA: Diagnosis not present

## 2016-10-09 NOTE — Patient Instructions (Addendum)
  1. Allergen avoidance measures  2. Treat and prevent inflammation with the following:   A. Flonase - one spray each nostril twice a day  B. Symbicort 160 - 2 puffs twice a day with spacer  C. montelukast 10 mg-  tablet 1 time per day  3. Treat and prevent reflux with the following:   A. consolidate all caffeine consumption slowly  B. continue Nexium 40 mg in AM  C. start ranitidine 300 mg in PM  4. If needed:   A. Ventolin HFA 2 puffs every 4-6 hours  B. Azelastine 1-2 puffs each nostril one-2 times per day  5. Immunotherapy?  6. Return to clinic in 4 weeks or earlier if problem

## 2016-10-09 NOTE — Progress Notes (Signed)
Dear Dr. Nori Riis,  Thank you for referring [name] [name] to the [facility] on 10/09/2016.  Below is a summation of this patient's evaluation and recommendations.  Thank you for your referral. I will keep you informed about this patient's response to treatment.   If you have any questions please do not hesitate to contact me.   Sincerely,  Jiles Prows, MD Allergy / [facility]   ______________________________________________________________________    NEW PATIENT NOTE  Referring Provider: Dickie La, MD Primary Provider: [name], MD Date of office visit: 10/09/2016    Subjective:   Chief Complaint:  [name] [name] (DOB: March 31, 1951) is a 66 y.o. female who presents to the clinic on 10/09/2016 with a chief complaint of New Patient (Initial Visit) .     HPI: [name] [name] presents to this clinic in evaluation of several different issues.  First, she has a history of asthma and allergic rhinitis treated with immunotherapy by Dr. [name] for over 10 years presently receiving her immunotherapy every week. She would like to continue on this form of treatment as she does think that it is helped her nasal congestion and sneezing. However, it should be noted that she still has nasal congestion and still has nose blowing and still has sneezing and cannot smell food.  Second, she has constant postnasal drip and throat clearing and raspy voice and can't clear out her throat and has intermittent cough. She's been given Flovent and a short acting bronchodilator which she uses every day usually in the morning but she still continues to remain with the symptoms. She does have reflux disease with regurgitation up into her throat even while using Nexium. She drinks 2 cups of coffee in the morning and soda one time per day.  Third, as mentioned above, she has been given the  diagnosis of asthma and uses Flovent and a short acting bronchodilator. It does not sound as though she has required hospitalization or emergency room evaluation for her asthma. She does not really exercise to any large degree because of musculoskeletal issues and a balance issue.  Fourth, she apparently was stung by some type of flying insects in her left finger about 4 years ago and developed a large local reaction without any associated systemic or constitutional symptoms.  Fifth, she has a history of tinnitus for which she was seen by Dr. Charna Busman in the past.  Sixth, she did obtain the flu vaccine this year.  Past Medical History:  Diagnosis Date  . Allergy   . Angina   . Anxiety   . Arthritis   . Asthma   . Blood transfusion   . Blood transfusion without reported diagnosis   . Cataract   . Chronic cough    Now followed by pulmonary  . Chronic lower back pain   . Complication of anesthesia    "just wake up coughing; that's all"  . Concussion 11/18/11   "fell at dr's office; hit head"  . COPD (chronic obstructive pulmonary disease) ([place])   . Depression   . Diabetes mellitus type II    "take insulin & pills"  . Dyslipidemia   . Exertional dyspnea   . GERD (gastroesophageal reflux disease)   . Glaucoma   . Gout   . Headache(784.0) 11/18/11   "I've had mild headaches the last couple days"  . Headache(784.0) 01/08/12   "pretty constant  since 11/18/11's concussion"  . Heart murmur   . History of colonoscopy   . HTN (hypertension)   . Myocardial infarction   . Osteoporosis   . Peripheral neuropathy ([place])   . Pneumonia    "i've had it once" (11/18/11)  . Sleep apnea    on CPAP 12  . Syncope 06/16/2012  . Vocal cord paralysis, unilateral partial    "right"    Past Surgical History:  Procedure Laterality Date  . ANTERIOR CERVICAL DECOMP/DISCECTOMY FUSION  01/15/2012   Procedure: ANTERIOR CERVICAL DECOMPRESSION/DISCECTOMY FUSION 3 LEVELS;  Surgeon: Eustace Moore, MD;   Location: [facility];  Service: Neurosurgery;  Laterality: N/A;  Anterior Cervical Decompression Discectomy Fusion Cerivcal three four, cervical four five, cervical five six, cervical six seven  . BREAST BIOPSY     bilaterally  . BREAST CYST EXCISION     right twice; left once  . BREAST REDUCTION SURGERY  04-21-2003  . BRONCHOSCOPY  07-2007  . CARDIAC CATHETERIZATION  03-02-2004  . CARPAL TUNNEL RELEASE     left  . CATARACT EXTRACTION  1955; 1979   bilateral; right eye  . CESAREAN SECTION  1987  . COLONOSCOPY W/ BIOPSIES  11/21/2010   adenoma polyp--no hi grade dysplasia Dr Collene Mares  . EYE SURGERY    . KNEE ARTHROSCOPY  08/2000   right  . LEFT AND RIGHT HEART CATHETERIZATION WITH CORONARY ANGIOGRAM N/A 01/19/2014   Procedure: LEFT AND RIGHT HEART CATHETERIZATION WITH CORONARY ANGIOGRAM;  Surgeon: Sinclair Grooms, MD;  Location: [facility] CATH LAB;  Service: Cardiovascular;  Laterality: N/A;  . SHOULDER ARTHROSCOPY  08/2000   right  . SHOULDER ARTHROSCOPY W/ ROTATOR CUFF REPAIR  10/18/2003   left  . SPINE SURGERY    . T-score  09/02/04   -0.54 (low risk currently)  . TOTAL ABDOMINAL HYSTERECTOMY  10-07-2000  . TREATMENT FISTULA ANAL    . TUBAL LIGATION  1987    Allergies as of 10/09/2016      Reactions   Bee Venom Hives, Swelling   Propoxyphene Hcl Itching   Amlodipine Besylate Swelling   Hydrocodone Other (See Comments)   With Vicodin - makes patient "jittery", and "hangover effect - sleepy next day"   Lisinopril Cough   Changed to ARB   Metformin And Related Diarrhea   GI distress   Metronidazole Other (See Comments)   REACTION: "just didn't work; my body never did heal from it"   Valsartan Other (See Comments)   REACTION:  "sleep more the next day after I took it; it made me real tired"      Medication List      ACCU-CHEK AVIVA PLUS w/Device Kit Use to check blood sugar 3-4 times daily   ACCU-CHEK FASTCLIX LANCETS Misc TEST BLOOD SUGAR  TWO  TO THREE TIMES DAILY   acyclovir  200 MG capsule Commonly known as:  ZOVIRAX Take 200 mg by mouth 2 (two) times daily.   allopurinol 300 MG tablet Commonly known as:  ZYLOPRIM TAKE 1 TABLET EVERY DAY   ammonium lactate 12 % lotion Commonly known as:  LAC-HYDRIN Apply 1 application topically 2 (two) times daily as needed for dry skin.   aspirin EC 81 MG tablet Take 81 mg by mouth daily.   atorvastatin 40 MG tablet Commonly known as:  LIPITOR TAKE 1 TABLET EVERY DAY   azelastine 0.1 % nasal spray Commonly known as:  ASTELIN USE 2 SPRAYS IN EACH NOSTRIL EVERY DAY AS NEEDED AS DIRECTED  B-D SINGLE USE SWABS REGULAR Pads USE THREE TIMES DAILY   B-D ULTRAFINE III SHORT PEN 31G X 8 MM Misc Generic drug:  Insulin Pen Needle USE THREE TIMES DAILY   buPROPion 100 MG tablet Commonly known as:  WELLBUTRIN Take 1 tablet (100 mg total) by mouth 2 (two) times daily.   Cholecalciferol 50000 units capsule Take one by mouth weekly   COMBIGAN 0.2-0.5 % ophthalmic solution Generic drug:  brimonidine-timolol Place 1 drop into the left eye every 12 (twelve) hours.   EPINEPHrine 0.3 mg/0.3 mL Soaj injection Commonly known as:  EPI-PEN Inject 0.3 mLs (0.3 mg total) into the muscle once as needed (for allergic reaction).   esomeprazole 40 MG capsule Commonly known as:  NEXIUM TAKE 1 CAPSULE ONE TIME DAILY   ezetimibe 10 MG tablet Commonly known as:  ZETIA Take 1 tablet (10 mg total) by mouth daily.   FLOVENT HFA 110 MCG/ACT inhaler Generic drug:  fluticasone INHALE 2 PUFFS INTO THE LUNGS EVERY DAY   furosemide 40 MG tablet Commonly known as:  LASIX TAKE 1 TABLET EVERY DAY   glucose blood test strip Test blood sugar 3-4 times daily   LANTUS SOLOSTAR 100 UNIT/ML Solostar Pen Generic drug:  Insulin Glargine Inject 50 units daily into skin for diabetes   latanoprost 0.005 % ophthalmic solution Commonly known as:  XALATAN Place 1 drop into both eyes at bedtime.   LINZESS 290 MCG Caps capsule Generic drug:   linaclotide   losartan 50 MG tablet Commonly known as:  COZAAR TAKE 1 TABLET EVERY DAY   metoprolol succinate 50 MG 24 hr tablet Commonly known as:  TOPROL-XL Take 3 tablets (150 mg total) by mouth daily. Take with or immediately following a meal.   montelukast 10 MG tablet Commonly known as:  SINGULAIR TAKE 1 TABLET EVERY DAY   MULTIVITAMIN PO Take 1 tablet by mouth daily.   nitroGLYCERIN 0.4 MG SL tablet Commonly known as:  NITROSTAT Place 1 tablet (0.4 mg total) under the tongue every 5 (five) minutes as needed for chest pain.   NOVOLOG FLEXPEN 100 UNIT/ML FlexPen Generic drug:  insulin aspart INJECT 30 UNITS SUBCUTANEOUSLY WITH THE EARLY MEAL AND 30 UNITS WITH THE LATE MEAL   salicylic acid 17 % gel Apply topically 2 (two) times daily. Apply small amount to callous area on bottom of foot BID   sertraline 100 MG tablet Commonly known as:  ZOLOFT TAKE 1 AND 1/2 TABLETS AT BEDTIME   spironolactone 25 MG tablet Commonly known as:  ALDACTONE TAKE 1 TABLET EVERY DAY BEFORE SUPPER   traMADol 50 MG tablet Commonly known as:  ULTRAM TAKE 1 TO 2 TABLETS BY MOUTH EVERY 8 HOURS AS NEEDED FOR PAIN (MAX 6 TABLETS PER 24 HOURS)   TRULICITY 2.42 AS/3.4HD Sopn Generic drug:  Dulaglutide INJECT  0.75MG  (0.5ML) SUBCUTANEOUSLY ONE TIME WEEKLY   VENTOLIN HFA 108 (90 Base) MCG/ACT inhaler Generic drug:  albuterol INHALE 2 PUFFS INTO THE LUNGS EVERY 6  HOURS AS NEEDED FOR WHEEZING OR SHORTNESS OF BREATH.       Review of systems negative except as noted in HPI / PMHx or noted below:  Review of Systems  Constitutional: Negative.   HENT: Negative.   Eyes: Negative.   Respiratory: Negative.   Cardiovascular: Negative.   Gastrointestinal: Negative.   Genitourinary: Negative.   Musculoskeletal: Negative.   Skin: Negative.   Neurological: Negative.   Endo/Heme/Allergies: Negative.   Psychiatric/Behavioral: Negative.     Family History  Problem Relation Age of Onset  .  Diabetes Mother   . Colon cancer Neg Hx   . Depression Neg Hx   . Anxiety disorder Neg Hx     Social History   Social History  . Marital status: Single    Spouse name: N/A  . Number of children: 1  . Years of education: 10   Occupational History  . unemployed Unemployed   Social History Main Topics  . Smoking status: Former Smoker    Packs/day: 0.50    Years: 0.50    Types: Cigarettes    Quit date: 09/03/1975  . Smokeless tobacco: Never Used  . Alcohol use No  . Drug use: No  . Sexual activity: Not Currently   Other Topics Concern  . Not on file   Social History Narrative   Approved for section 8 housing (12/03).         Health Care POA:    Emergency Contact: sister, catherine Turgeon [phone]   End of Life Plan:    Who lives with you: Has a son who may be autistic and stays with her at times.   Any pets: none   Diet: Pt has a varied diet of protein, starch, and vegetables.   Exercise: Pt walks 1x a week with [place] [employer] [facility] [employer].   Seatbelts: Pt reports wearing seatbelt when in vehicles.    Hobbies: walking, tv, church, eating          Environmental and Social history  Lives in a apartment with a dry environment, no animals located inside the household, carpeting in the bedroom, plastic on the bed but not the pillow, no smoking ongoing with inside the household.  Objective:   Vitals:   10/09/16 0839  BP: 120/70  Pulse: 72  Resp: 16   Height: 5' 1"  (154.9 cm) Weight: 201 lb (91.2 kg)  Physical Exam  Constitutional: She is well-developed, well-nourished, and in no distress.  Raspy voice, throat clearing, coughing  HENT:  Head: Normocephalic. Head is without right periorbital erythema and without left periorbital erythema.  Right Ear: External ear and ear canal normal. Tympanic membrane is scarred (tympanic sclerosis).  Left Ear: External ear and ear canal normal. Tympanic membrane is scarred ( tympanosclerosis).  Nose: Nose  normal. No mucosal edema or rhinorrhea.  Mouth/Throat: Oropharynx is clear and moist and mucous membranes are normal. No oropharyngeal exudate.  Eyes: Conjunctivae and lids are normal. Pupils are equal, round, and reactive to light.  Neck: Trachea normal. No tracheal deviation present. No thyromegaly present.  Cardiovascular: Normal rate, regular rhythm, S1 normal, S2 normal and normal heart sounds.   No murmur heard. Pulmonary/Chest: Effort normal. No stridor. No tachypnea. No respiratory distress. She has no wheezes. She has no rales. She exhibits no tenderness.  Musculoskeletal: She exhibits no edema or tenderness.  Lymphadenopathy:       Head (right side): No tonsillar adenopathy present.       Head (left side): No tonsillar adenopathy present.    She has no cervical adenopathy.    She has no axillary adenopathy.  Neurological: She is alert. Gait normal.  Skin: No rash noted. She is not diaphoretic. No erythema. No pallor. Nails show no clubbing.  Psychiatric: Mood and affect normal.    Diagnostics: Allergy skin tests were performed. She demonstrated hypersensitivity to house dust mite and tree pollen  Spirometry was performed and demonstrated an FEV1 of 1.62 @ 91 % of predicted.  Assessment and Plan:  1. Asthma, moderate persistent, well-controlled   2. Other allergic rhinitis   3. LPRD (laryngopharyngeal reflux disease)     1. Allergen avoidance measures  2. Treat and prevent inflammation with the following:   A. Flonase - one spray each nostril twice a day  B. Symbicort 160 - 2 puffs twice a day with spacer  C. montelukast 10 mg-  tablet 1 time per day  3. Treat and prevent reflux with the following:   A. consolidate all caffeine consumption slowly  B. continue Nexium 40 mg in AM  C. start ranitidine 300 mg in PM  4. If needed:   A. Ventolin HFA 2 puffs every 4-6 hours  B. Azelastine 1-2 puffs each nostril one-2 times per day  5. Immunotherapy?  6. Return  to clinic in 4 weeks or earlier if problem  Azure appears to have significant inflammation and irritation of her respiratory tract most likely multifactorial in nature including contribution from eosinophilic driven inflammation and reflux. I've assigned a plan to address both of these issues as noted above. I will hold off on restarting any immunotherapy at this point in time. I would like to see her back in this clinic in approximately 4 weeks to assess her response to therapy noted above.  Jiles Prows, MD [facility]

## 2016-10-10 ENCOUNTER — Encounter (HOSPITAL_COMMUNITY): Payer: Self-pay | Admitting: Psychiatry

## 2016-10-10 ENCOUNTER — Ambulatory Visit (INDEPENDENT_AMBULATORY_CARE_PROVIDER_SITE_OTHER): Payer: Medicare HMO | Admitting: Psychiatry

## 2016-10-10 DIAGNOSIS — Z87891 Personal history of nicotine dependence: Secondary | ICD-10-CM

## 2016-10-10 DIAGNOSIS — F411 Generalized anxiety disorder: Secondary | ICD-10-CM | POA: Diagnosis not present

## 2016-10-10 DIAGNOSIS — Z833 Family history of diabetes mellitus: Secondary | ICD-10-CM | POA: Diagnosis not present

## 2016-10-10 DIAGNOSIS — Z79899 Other long term (current) drug therapy: Secondary | ICD-10-CM

## 2016-10-10 DIAGNOSIS — Z7982 Long term (current) use of aspirin: Secondary | ICD-10-CM

## 2016-10-10 DIAGNOSIS — F331 Major depressive disorder, recurrent, moderate: Secondary | ICD-10-CM | POA: Diagnosis not present

## 2016-10-10 DIAGNOSIS — Z794 Long term (current) use of insulin: Secondary | ICD-10-CM

## 2016-10-10 MED ORDER — BUPROPION HCL 100 MG PO TABS: 100.0000 mg | ORAL_TABLET | Freq: Two times a day (BID) | ORAL | 0 refills | Status: DC

## 2016-10-10 MED ORDER — SERTRALINE HCL 100 MG PO TABS: 100.0000 mg | ORAL_TABLET | Freq: Every day | ORAL | 0 refills | Status: DC

## 2016-10-10 MED ORDER — BUDESONIDE-FORMOTEROL FUMARATE 160-4.5 MCG/ACT IN AERO: 2.0000 | INHALATION_SPRAY | Freq: Two times a day (BID) | RESPIRATORY_TRACT | 1 refills | Status: DC

## 2016-10-10 MED ORDER — RANITIDINE HCL 300 MG PO TABS: 300.0000 mg | ORAL_TABLET | Freq: Every day | ORAL | 1 refills | Status: DC

## 2016-10-10 NOTE — Progress Notes (Signed)
Patient ID: [name] [name], female   DOB: 1951/01/02, 66 y.o.   MRN: [id number] Patient ID: [name] [name], female   DOB: July 14, 1951, 66 y.o.   MRN: [id number]  [name] [id number] Progress Note  [name] [name] [id number] 66 y.o.  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 She is using [employer].    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 The rest of her time she goes to doctors appointments or doing something at home.   Depression got worse after her father passed. Denies crying spells. She spends a lot of time thinking about her kids. Denies anhedonia.   Sleep is variable. States her CPAP is not working as well.  Appetite is increased but she is trying to stop eating a lot of sweets and carbs. Pt is eating 1-2 meals a day.  Energy is fair. Concentration is ok.   Anxiety is mild.   Pt is taking meds as prescribed and states Zoloft makes her relaxed and sleepy.   Suicidal Ideation: No Plan Formed: No Patient has means to carry out plan: No  Homicidal Ideation: No Plan Formed: No Patient has means to carry out plan: No  Review of Systems: Psychiatric: Agitation: No Hallucination: No Depressed Mood: Yes Insomnia: Yes Hypersomnia: No Altered Concentration: No Feels Worthless: No Grandiose Ideas: No Belief In Special Powers: No New/Increased Substance Abuse: No Compulsions: No  Review of Systems  Eyes: Positive for blurred vision. Negative for double vision, pain and discharge.  Cardiovascular: Positive for leg swelling. Negative for chest pain and palpitations.  Neurological: Negative for dizziness, tremors, sensory change, seizures, loss of consciousness and  headaches.  Psychiatric/Behavioral: Positive for depression. Negative for hallucinations, substance abuse and suicidal ideas. The patient is nervous/anxious and has insomnia.      Past Medical Family, Social History: She was born and raised in [place]. 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 Patient was born with bilateral congenital cataracts and had her first surgery at age 40 and her second surgery in 61. She finished high school and subsequently worked. She was never married and had her son at the age of 40 raised him as a single mother.Patient is on Social Security disability.  Social History   Social History  . Marital status: Single    Spouse name: N/A  . Number of children: 1  . Years of education: 71   Occupational History  . unemployed Unemployed   Social History Main Topics  . Smoking status: Former Smoker    Packs/day: 0.50    Years: 0.50    Types: Cigarettes    Quit date: 09/03/1975  . Smokeless tobacco: Never Used  . Alcohol use No  . Drug use: No  . Sexual activity: Not Currently   Other Topics Concern  . None   Social History Narrative   Approved for section 8 housing (12/03).         Health Care POA:    Emergency Contact: sister, [name] [name] [phone]   End of Life Plan:    Who lives with you: Has a son who may be autistic and stays with her at times.   Any pets: none   Diet: Pt has  a varied diet of protein, starch, and vegetables.   Exercise: Pt walks 1x a week with [place] [facility] [employer].   Seatbelts: Pt reports wearing seatbelt when in vehicles.    Hobbies: walking, tv, church, eating         Family History  Problem Relation Age of Onset  . Diabetes Mother   . Colon cancer Neg Hx   . Depression Neg Hx   . Anxiety disorder Neg Hx     Past Medical History:  Diagnosis Date  . Allergy   . Angina   . Anxiety   . Arthritis   . Asthma   . Blood transfusion   . Blood transfusion without reported diagnosis   .  Cataract   . Chronic cough    Now followed by pulmonary  . Chronic lower back pain   . Complication of anesthesia    "just wake up coughing; that's all"  . Concussion 11/18/11   "fell at dr's office; hit head"  . COPD (chronic obstructive pulmonary disease) ([place])   . Depression   . Diabetes mellitus type II    "take insulin & pills"  . Dyslipidemia   . Exertional dyspnea   . GERD (gastroesophageal reflux disease)   . Glaucoma   . Gout   . Headache(784.0) 11/18/11   "I've had mild headaches the last couple days"  . Headache(784.0) 01/08/12   "pretty constant since 11/18/11's concussion"  . Heart murmur   . History of colonoscopy   . HTN (hypertension)   . Myocardial infarction   . Osteoporosis   . Peripheral neuropathy ([place])   . Pneumonia    "i've had it once" (11/18/11)  . Sleep apnea    on CPAP 12  . Syncope 06/16/2012  . Vocal cord paralysis, unilateral partial    "right"    Outpatient Encounter Prescriptions as of 10/10/2016  Medication Sig  . ACCU-CHEK FASTCLIX LANCETS MISC TEST BLOOD SUGAR  TWO  TO THREE TIMES DAILY  . acyclovir (ZOVIRAX) 200 MG capsule Take 200 mg by mouth 2 (two) times daily.  . Alcohol Swabs (B-D SINGLE USE SWABS REGULAR) PADS USE THREE TIMES DAILY  . allopurinol (ZYLOPRIM) 300 MG tablet TAKE 1 TABLET EVERY DAY  . ammonium lactate (LAC-HYDRIN) 12 % lotion Apply 1 application topically 2 (two) times daily as needed for dry skin.  [name] aspirin EC 81 MG tablet Take 81 mg by mouth daily.  [name] atorvastatin (LIPITOR) 40 MG tablet TAKE 1 TABLET EVERY DAY  . azelastine (ASTELIN) 0.1 % nasal spray USE 2 SPRAYS IN EACH NOSTRIL EVERY DAY AS NEEDED AS DIRECTED  . B-D ULTRAFINE III SHORT PEN 31G X 8 MM MISC USE THREE TIMES DAILY  . Blood Glucose Monitoring Suppl (ACCU-CHEK AVIVA PLUS) W/DEVICE KIT Use to check blood sugar 3-4 times daily  . budesonide-formoterol (SYMBICORT) 160-4.5 MCG/ACT inhaler Inhale 2 puffs into the lungs 2 (two) times daily.  [name] buPROPion  (WELLBUTRIN) 100 MG tablet Take 1 tablet (100 mg total) by mouth 2 (two) times daily.  . Cholecalciferol 50000 units capsule Take one by mouth weekly  . COMBIGAN 0.2-0.5 % ophthalmic solution Place 1 drop into the left eye every 12 (twelve) hours.   [name] EPINEPHrine 0.3 mg/0.3 mL IJ SOAJ injection Inject 0.3 mLs (0.3 mg total) into the muscle once as needed (for allergic reaction).  [name] esomeprazole (NEXIUM) 40 MG capsule TAKE 1 CAPSULE ONE TIME DAILY  . ezetimibe (ZETIA) 10 MG tablet Take 1 tablet (  10 mg total) by mouth daily.  [name] FLOVENT HFA 110 MCG/ACT inhaler INHALE 2 PUFFS INTO THE LUNGS EVERY DAY  . furosemide (LASIX) 40 MG tablet TAKE 1 TABLET EVERY DAY  . glucose blood test strip Test blood sugar 3-4 times daily  . Insulin Glargine (LANTUS SOLOSTAR) 100 UNIT/ML Solostar Pen Inject 50 units daily into skin for diabetes  . latanoprost (XALATAN) 0.005 % ophthalmic solution Place 1 drop into both eyes at bedtime.  [name] LINZESS 290 MCG CAPS capsule   . losartan (COZAAR) 50 MG tablet TAKE 1 TABLET EVERY DAY  . metoprolol succinate (TOPROL-XL) 50 MG 24 hr tablet Take 3 tablets (150 mg total) by mouth daily. Take with or immediately following a meal.  . montelukast (SINGULAIR) 10 MG tablet TAKE 1 TABLET EVERY DAY  . Multiple Vitamins-Minerals (MULTIVITAMIN PO) Take 1 tablet by mouth daily.  . nitroGLYCERIN (NITROSTAT) 0.4 MG SL tablet Place 1 tablet (0.4 mg total) under the tongue every 5 (five) minutes as needed for chest pain.  [name] NOVOLOG FLEXPEN 100 UNIT/ML FlexPen INJECT 30 UNITS SUBCUTANEOUSLY WITH THE EARLY MEAL AND 30 UNITS WITH THE LATE MEAL  . ranitidine (ZANTAC) 300 MG tablet Take 1 tablet (300 mg total) by mouth at bedtime.  . salicylic acid 17 % gel Apply topically 2 (two) times daily. Apply small amount to callous area on bottom of foot BID  . sertraline (ZOLOFT) 100 MG tablet TAKE 1 AND 1/2 TABLETS AT BEDTIME  . spironolactone (ALDACTONE) 25 MG tablet TAKE 1 TABLET EVERY DAY BEFORE SUPPER   . traMADol (ULTRAM) 50 MG tablet TAKE 1 TO 2 TABLETS BY MOUTH EVERY 8 HOURS AS NEEDED FOR PAIN (MAX 6 TABLETS PER 24 HOURS)  . TRULICITY 2.70 WC/3.7SE SOPN INJECT  0.75MG  (0.5ML) SUBCUTANEOUSLY ONE TIME WEEKLY  . VENTOLIN HFA 108 (90 Base) MCG/ACT inhaler INHALE 2 PUFFS INTO THE LUNGS EVERY 6  HOURS AS NEEDED FOR WHEEZING OR SHORTNESS OF BREATH.   No facility-administered encounter medications on file as of 10/10/2016.     Past Psychiatric History/Hospitalization(s): Anxiety: Yes Bipolar Disorder: No Depression: Yes Mania: No Psychosis: No Schizophrenia: No Personality Disorder: No Hospitalization for psychiatric illness: No History of Electroconvulsive Shock Therapy: No Prior Suicide Attempts: No  Physical Exam: Constitutional:  BP 122/80   Pulse 80   Ht _0  (1.549 m)   Wt 198 lb 12.8 oz (90.2 kg)   BMI 37.56 kg/m   General Appearance: alert, oriented, no acute distress  Musculoskeletal: Strength & Muscle Tone: decreased and walks with cane Gait & Station: unsteady Patient leans: Front  Mental Status Examination/Evaluation: reviewed MSE on 10/10/16 and same as previous visits except as noted  Objective: Attitude: Calm and cooperative  Appearance: Casual, appears to be stated age  Eye Contact::  Good has glaucoma and difficulty seeing despite using her glasses  Speech:  Clear and Coherent and Normal Rate  Volume:  Normal  Mood:  depressed  Affect:  Congruent  Thought Process:  Goal Directed  Orientation:  Full (Time, Place, and Person)  Thought Content:  Negative  Suicidal Thoughts:  No  Homicidal Thoughts:  No  Judgement:  Good  Insight:  Good  Concentration: good  Memory: Immediate-good Recent-good Remote-good  Recall: fair  Language: fair  Gait and Station: normal  [employer] of Knowledge: average  Psychomotor Activity:  Normal  Akathisia:  No  Handed:  Right  AIMS (if indicated): n/a   Assets:  Communication Skills Desire for  Improvement Housing  Leisure Time Resilience Social Support        reviewed A&P on 10/10/16 and same as previous visits except as noted  Assessment: MDD- recurrent, moderate; GAD  #1 Maj. depression recurrent chronic moderate. Wellbutrin  136m BID by mouth every morning. For constipation recommend fiber or laxative.   Decrease Zoloft to 1027mby mouth every a.m.  #2 generalized anxiety disorder. Wellbutrin and Zoloft Also discussed relaxations and deep breathing techniques with her and patient has agreed to practice them at home. States at some point she would like to get off Zoloft and Wellbutrin in the future.   #3 Multiple medical problems- Will be treated by her PCP Dr. SaMallie Mussel #4 nutrition- Discussed the importance of nutrition and psychoeducation regarding nutrition given her multiple health problems was discussed with her. Patient stated understanding and also encouraged her to walk a little more so that she moves.   Medication management with supportive therapy. Risks/benefits and SE of the medication discussed. Pt verbalized understanding and verbal consent obtained for treatment.  Affirm with the patient that the medications are taken as ordered. Patient expressed understanding of how their medications were to be used.   Therapy: brief supportive therapy provided. Discussed psychosocial stressors in detail.    Labs: reviewed 02/14/2016 glu 114, HBA1c 7.6  Pt denies SI and is at an acute low risk for suicide.Patient told to call clinic if any problems occur. Patient advised to go to ER if they should develop SI/HI, side effects, or if symptoms worsen. Has crisis numbers to call if needed. Pt verbalized understanding.  F/up in 3 months or sooner if needed   SaCharlcie CradleMD 10/10/2016

## 2016-10-19 ENCOUNTER — Other Ambulatory Visit (HOSPITAL_COMMUNITY): Payer: Self-pay | Admitting: Psychiatry

## 2016-10-19 DIAGNOSIS — F331 Major depressive disorder, recurrent, moderate: Secondary | ICD-10-CM

## 2016-10-19 DIAGNOSIS — F411 Generalized anxiety disorder: Secondary | ICD-10-CM

## 2016-10-23 ENCOUNTER — Encounter: Payer: Self-pay | Admitting: Family Medicine

## 2016-10-23 ENCOUNTER — Ambulatory Visit (INDEPENDENT_AMBULATORY_CARE_PROVIDER_SITE_OTHER): Payer: Medicare HMO | Admitting: Family Medicine

## 2016-10-23 VITALS — BP 128/62 | HR 78 | Temp 97.5°F | Ht 61.0 in | Wt 202.2 lb

## 2016-10-23 DIAGNOSIS — H543 Unqualified visual loss, both eyes: Secondary | ICD-10-CM | POA: Diagnosis not present

## 2016-10-23 DIAGNOSIS — J029 Acute pharyngitis, unspecified: Secondary | ICD-10-CM

## 2016-10-23 DIAGNOSIS — B9789 Other viral agents as the cause of diseases classified elsewhere: Secondary | ICD-10-CM

## 2016-10-23 DIAGNOSIS — L02411 Cutaneous abscess of right axilla: Secondary | ICD-10-CM

## 2016-10-23 DIAGNOSIS — J028 Acute pharyngitis due to other specified organisms: Secondary | ICD-10-CM

## 2016-10-23 MED ORDER — AZITHROMYCIN 250 MG PO TABS: ORAL_TABLET | ORAL | 0 refills | Status: DC

## 2016-10-23 MED FILL — AZITHROMYCIN 250 MG TABLET: 250 | 5 days supply | Qty: 6 | Fill #0

## 2016-10-23 NOTE — Progress Notes (Signed)
    CHIEF COMPLAINT / HPI:   #1. Fever at home last night with some chills this morning. Also feels very fatigued this morning. 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 #2. Has noticed some tender swelling under her right axilla for the last 2 weeks. Had some drainage for a few days but that has resolved. #3. Headache today, global, 4 out of 10. Better she lies down. Patient did have flu shot this year. #4. Dry sore throat for 2 days. No cough, no exudate. #5. Needs clearance for attendance at special. Needs forms filled out.  REVIEW OF SYSTEMS:  See history of present illness  OBJECTIVE:  Vital signs are reviewed.   GENERAL: Well-developed, well-nourished, no acute distress, obviously not feeling well as she wants to lie down during the exam and waiting. CARDIOVASCULAR: Regular rate and rhythm no murmur gallop or rub. No dizziness upon standing. HEENT: Oropharynx is mildly erythematous, somewhat dry. No exudate. Neck has some shotty lymphadenopathy in the bilateral anterior cervical chains. Full range of motion. Legally blind. Conjunctivae is a nonicteric Extremity: Axilla on the right reveals a small 3/4 cm area of induration with a small amount of pus notable on deep palpation. The left axilla is tender to palpation but there is no lesions, no masses. LUNGS: Clear to auscultation bilaterally, no rales or wheeze. ABDOMEN: Soft positive bowel sounds NEURO: No gross focal neurological deficits. MSK: Movement of extremity x 4.    ASSESSMENT / PLAN: 1. Small skin abscess. Warm compresses. I will place her on ace azithromycin for skin coverage. Chose this because it is essentially free from her pharmacy and finances are a big issue for her.  #2. Sore throat. I suspect this is viral. If it is not and I'm missing something then she will get some coverage from the azithromycin. She says this does not feel like the flu and  she did have her flu shot. Offered Tamiflu anyway but she says it made her feel bad the last time she took it 2 years ago and she doesn't take it now. She'll call me if things get worse 33. Filled out forms for [facility].

## 2016-10-23 NOTE — Assessment & Plan Note (Signed)
Filled out clearance form for  [facility].

## 2016-10-29 ENCOUNTER — Telehealth: Payer: Self-pay | Admitting: Family Medicine

## 2016-10-29 NOTE — Telephone Encounter (Signed)
Pt states 2 things got left out of the paper she got back last week at her appointment. Pt needs 2 copies of her medication list and 2 copies of her allergy list. Pt would like them mailed to her. Address on file is correct. ep

## 2016-10-30 ENCOUNTER — Other Ambulatory Visit: Payer: Self-pay | Admitting: Family Medicine

## 2016-10-31 ENCOUNTER — Telehealth: Payer: Self-pay | Admitting: Family Medicine

## 2016-10-31 NOTE — Telephone Encounter (Signed)
Pt would like an antibotic. Her glands are still swollen. She would also like to get some cough syrup.  She has tried mucinex cough syrup but that hasnt work.  Writer on [employer]

## 2016-11-01 MED ORDER — AMOXICILLIN 500 MG PO CAPS: 500.0000 mg | ORAL_CAPSULE | Freq: Three times a day (TID) | ORAL | 0 refills | Status: DC

## 2016-11-01 MED ORDER — DEXTROMETHORPHAN-GUAIFENESIN 10-100 MG/5ML PO SYRP: 5.0000 mL | ORAL_SOLUTION | Freq: Two times a day (BID) | ORAL | 0 refills | Status: DC

## 2016-11-01 MED FILL — AMOXICILLIN 500 MG CAPSULE: 500 | 10 days supply | Qty: 30 | Fill #0

## 2016-11-04 DIAGNOSIS — G4733 Obstructive sleep apnea (adult) (pediatric): Secondary | ICD-10-CM | POA: Diagnosis not present

## 2016-11-04 NOTE — Telephone Encounter (Signed)
Called pt.  If pt calls, please inform pt that copies for medication list and allergy list are being left up front for pt to pick up. We can not mail this out. When pt comes to pick up the copies she will need to fill out a Patient Request for Access form before she can receive the copies. Ottis Stain, CMA

## 2016-11-06 ENCOUNTER — Telehealth: Payer: Self-pay | Admitting: *Deleted

## 2016-11-06 ENCOUNTER — Telehealth: Payer: Self-pay | Admitting: Allergy and Immunology

## 2016-11-06 ENCOUNTER — Ambulatory Visit (INDEPENDENT_AMBULATORY_CARE_PROVIDER_SITE_OTHER): Payer: Commercial Managed Care - HMO | Admitting: Allergy and Immunology

## 2016-11-06 VITALS — BP 128/78 | HR 92 | Resp 22

## 2016-11-06 DIAGNOSIS — K219 Gastro-esophageal reflux disease without esophagitis: Secondary | ICD-10-CM | POA: Diagnosis not present

## 2016-11-06 DIAGNOSIS — J3089 Other allergic rhinitis: Secondary | ICD-10-CM | POA: Diagnosis not present

## 2016-11-06 DIAGNOSIS — J4541 Moderate persistent asthma with (acute) exacerbation: Secondary | ICD-10-CM

## 2016-11-06 MED ORDER — FLUTICASONE PROPIONATE 50 MCG/ACT NA SUSP: 1.0000 | Freq: Two times a day (BID) | NASAL | 5 refills | Status: DC

## 2016-11-06 NOTE — Telephone Encounter (Signed)
Sent rx to pharmacy patient notified  

## 2016-11-06 NOTE — Telephone Encounter (Signed)
Prior Authorization received from [facility] for metoprolol succinate 50 mg. Formulary and PA form placed in provider box for completion. Derl Barrow, RN

## 2016-11-06 NOTE — Progress Notes (Signed)
Follow-up Note  Referring Provider: Dickie La, MD Primary Provider: [name], MD Date of Office Visit: 11/06/2016  Subjective:   [name] [name] (DOB: 06-Apr-1951) is a 66 y.o. female who returns to the [facility] [place] on 11/06/2016 in re-evaluation of the following:  HPI: Delara presents to this clinic in reevaluation of her asthma, allergic rhinitis, and LPR. I last saw her in his clinic during her initial evaluation of 10/09/2016.  Unfortunately on the 21st of this month she developed chills and a cough and she was treated with azithromycin by her primary care doctor. She still continues to have some cough and some postnasal drip and she feels as though the "glands" in her neck are enlarged. She started amoxicillin yesterday. She did not have any ugly nasal discharge and she does not have any chest pain or ugly sputum production. She really doesn't have a tremendous amount of shortness of breath and she has not really noticed any wheezing. She does use her short-acting bronchodilator every morning.  The plan that was initiated during her visit of 10/09/2016 was only utilized for about a week or so because she had some delay in obtaining medications from the pharmacy. Thus, she doesn't really know if the issue with her throat and reflux is better and she's not really sure that the issue with her airway is any better. She did perform house dust avoidance measures.   She has been using Symbicort and montelukast and Nexium and most recently ranitidine but she has not been using any Flonase.  Allergies as of 11/06/2016      Reactions   Bee Venom Hives, Swelling   Propoxyphene Hcl Itching   Amlodipine Besylate Swelling   Hydrocodone Other (See Comments)   With Vicodin - makes patient "jittery", and "hangover effect - sleepy next day"   Lisinopril Cough   Changed to ARB   Metformin And Related Diarrhea   GI distress   Metronidazole Other (See Comments)   REACTION:  "just didn't work; my body never did heal from it"   Valsartan Other (See Comments)   REACTION:  "sleep more the next day after I took it; it made me real tired"      Medication List      ACCU-CHEK AVIVA PLUS w/Device Kit Use to check blood sugar 3-4 times daily   ACCU-CHEK FASTCLIX LANCETS Misc TEST BLOOD SUGAR  TWO  TO THREE TIMES DAILY   acyclovir 200 MG capsule Commonly known as:  ZOVIRAX Take 200 mg by mouth 2 (two) times daily.   allopurinol 300 MG tablet Commonly known as:  ZYLOPRIM TAKE 1 TABLET EVERY DAY   ammonium lactate 12 % lotion Commonly known as:  LAC-HYDRIN Apply 1 application topically 2 (two) times daily as needed for dry skin.   amoxicillin 500 MG capsule Commonly known as:  AMOXIL Take 1 capsule (500 mg total) by mouth 3 (three) times daily.   aspirin EC 81 MG tablet Take 81 mg by mouth daily.   atorvastatin 40 MG tablet Commonly known as:  LIPITOR TAKE 1 TABLET EVERY DAY   azelastine 0.1 % nasal spray Commonly known as:  ASTELIN USE 2 SPRAYS IN EACH NOSTRIL EVERY DAY AS NEEDED AS DIRECTED   B-D SINGLE USE SWABS REGULAR Pads USE THREE TIMES DAILY   B-D ULTRAFINE III SHORT PEN 31G X 8 MM Misc Generic drug:  Insulin Pen Needle USE THREE TIMES DAILY   budesonide-formoterol 160-4.5 MCG/ACT inhaler Commonly known  as:  SYMBICORT Inhale 2 puffs into the lungs 2 (two) times daily.   buPROPion 100 MG tablet Commonly known as:  WELLBUTRIN Take 1 tablet (100 mg total) by mouth 2 (two) times daily.   Cholecalciferol 50000 units capsule Take one by mouth weekly   COMBIGAN 0.2-0.5 % ophthalmic solution Generic drug:  brimonidine-timolol Place 1 drop into the left eye every 12 (twelve) hours.   Dextromethorphan-Guaifenesin 10-100 MG/5ML liquid Take 5 mLs by mouth every 12 (twelve) hours.   EPINEPHrine 0.3 mg/0.3 mL Soaj injection Commonly known as:  EPI-PEN Inject 0.3 mLs (0.3 mg total) into the muscle once as needed (for allergic reaction).    esomeprazole 40 MG capsule Commonly known as:  NEXIUM TAKE 1 CAPSULE ONE TIME DAILY   ezetimibe 10 MG tablet Commonly known as:  ZETIA Take 1 tablet (10 mg total) by mouth daily.   furosemide 40 MG tablet Commonly known as:  LASIX TAKE 1 TABLET EVERY DAY   glucose blood test strip Test blood sugar 3-4 times daily   LANTUS SOLOSTAR 100 UNIT/ML Solostar Pen Generic drug:  Insulin Glargine Inject 50 units daily into skin for diabetes   latanoprost 0.005 % ophthalmic solution Commonly known as:  XALATAN Place 1 drop into both eyes at bedtime.   LINZESS 290 MCG Caps capsule Generic drug:  linaclotide   losartan 50 MG tablet Commonly known as:  COZAAR TAKE 1 TABLET EVERY DAY   metoprolol succinate 50 MG 24 hr tablet Commonly known as:  TOPROL-XL TAKE 3 TABLETS EVERY DAY  WITH  OR  IMMEDIATELY  FOLLOWING A MEAL   montelukast 10 MG tablet Commonly known as:  SINGULAIR TAKE 1 TABLET EVERY DAY   MULTIVITAMIN PO Take 1 tablet by mouth daily.   nitroGLYCERIN 0.4 MG SL tablet Commonly known as:  NITROSTAT Place 1 tablet (0.4 mg total) under the tongue every 5 (five) minutes as needed for chest pain.   NOVOLOG FLEXPEN 100 UNIT/ML FlexPen Generic drug:  insulin aspart INJECT 30 UNITS SUBCUTANEOUSLY WITH THE EARLY MEAL AND 30 UNITS WITH THE LATE MEAL   ranitidine 300 MG tablet Commonly known as:  ZANTAC Take 1 tablet (300 mg total) by mouth at bedtime.   salicylic acid 17 % gel Apply topically 2 (two) times daily. Apply small amount to callous area on bottom of foot BID   sertraline 100 MG tablet Commonly known as:  ZOLOFT Take 1 tablet (100 mg total) by mouth daily.   spironolactone 25 MG tablet Commonly known as:  ALDACTONE TAKE 1 TABLET EVERY DAY BEFORE SUPPER   traMADol 50 MG tablet Commonly known as:  ULTRAM TAKE 1 TO 2 TABLETS BY MOUTH EVERY 8 HOURS AS NEEDED FOR PAIN (MAX 6 TABLETS PER 24 HOURS)   TRULICITY 3.01 SW/1.0XN Sopn Generic drug:   Dulaglutide INJECT  0.75MG  (0.5ML) SUBCUTANEOUSLY ONE TIME WEEKLY   VENTOLIN HFA 108 (90 Base) MCG/ACT inhaler Generic drug:  albuterol INHALE 2 PUFFS INTO THE LUNGS EVERY 6  HOURS AS NEEDED FOR WHEEZING OR SHORTNESS OF BREATH.       Past Medical History:  Diagnosis Date  . Allergy   . Angina   . Anxiety   . Arthritis   . Asthma   . Blood transfusion   . Blood transfusion without reported diagnosis   . Cataract   . Chronic cough    Now followed by pulmonary  . Chronic lower back pain   . Complication of anesthesia    "just wake  up coughing; that's all"  . Concussion 11/18/11   "fell at dr's office; hit head"  . COPD (chronic obstructive pulmonary disease) ([place])   . Depression   . Diabetes mellitus type II    "take insulin & pills"  . Dyslipidemia   . Exertional dyspnea   . GERD (gastroesophageal reflux disease)   . Glaucoma   . Gout   . Headache(784.0) 11/18/11   "I've had mild headaches the last couple days"  . Headache(784.0) 01/08/12   "pretty constant since 11/18/11's concussion"  . Heart murmur   . History of colonoscopy   . HTN (hypertension)   . Myocardial infarction   . Osteoporosis   . Peripheral neuropathy ([place])   . Pneumonia    "i've had it once" (11/18/11)  . Sleep apnea    on CPAP 12  . Syncope 06/16/2012  . Vocal cord paralysis, unilateral partial    "right"    Past Surgical History:  Procedure Laterality Date  . ANTERIOR CERVICAL DECOMP/DISCECTOMY FUSION  01/15/2012   Procedure: ANTERIOR CERVICAL DECOMPRESSION/DISCECTOMY FUSION 3 LEVELS;  Surgeon: Eustace Moore, MD;  Location: [facility];  Service: Neurosurgery;  Laterality: N/A;  Anterior Cervical Decompression Discectomy Fusion Cerivcal three four, cervical four five, cervical five six, cervical six seven  . BREAST BIOPSY     bilaterally  . BREAST CYST EXCISION     right twice; left once  . BREAST REDUCTION SURGERY  04-21-2003  . BRONCHOSCOPY  07-2007  . CARDIAC CATHETERIZATION  03-02-2004   . CARPAL TUNNEL RELEASE     left  . CATARACT EXTRACTION  1955; 1979   bilateral; right eye  . CESAREAN SECTION  1987  . COLONOSCOPY W/ BIOPSIES  11/21/2010   adenoma polyp--no hi grade dysplasia Dr Collene Mares  . EYE SURGERY    . KNEE ARTHROSCOPY  08/2000   right  . LEFT AND RIGHT HEART CATHETERIZATION WITH CORONARY ANGIOGRAM N/A 01/19/2014   Procedure: LEFT AND RIGHT HEART CATHETERIZATION WITH CORONARY ANGIOGRAM;  Surgeon: Sinclair Grooms, MD;  Location: [facility];  Service: Cardiovascular;  Laterality: N/A;  . SHOULDER ARTHROSCOPY  08/2000   right  . SHOULDER ARTHROSCOPY W/ ROTATOR CUFF REPAIR  10/18/2003   left  . SPINE SURGERY    . T-score  09/02/04   -0.54 (low risk currently)  . TOTAL ABDOMINAL HYSTERECTOMY  10-07-2000  . TREATMENT FISTULA ANAL    . TUBAL LIGATION  1987    Review of systems negative except as noted in HPI / PMHx or noted below:  Review of Systems  Constitutional: Negative.   HENT: Negative.   Eyes: Negative.   Respiratory: Negative.   Cardiovascular: Negative.   Gastrointestinal: Negative.   Genitourinary: Negative.   Musculoskeletal: Negative.   Skin: Negative.   Neurological: Negative.   Endo/Heme/Allergies: Negative.   Psychiatric/Behavioral: Negative.      Objective:   Vitals:   11/06/16 0952  BP: 128/78  Pulse: 92  Resp: (!) 22          Physical Exam  Constitutional: She is well-developed, well-nourished, and in no distress.  Cough, raspy voice  HENT:  Head: Normocephalic.  Right Ear: External ear and ear canal normal. Tympanic membrane is scarred.  Left Ear: External ear and ear canal normal. Tympanic membrane is scarred.  Nose: Nose normal. No mucosal edema or rhinorrhea.  Mouth/Throat: Uvula is midline, oropharynx is clear and moist and mucous membranes are normal. No oropharyngeal exudate.  Eyes: Conjunctivae are normal.  Neck: Trachea normal. No tracheal tenderness present. No tracheal deviation present. No thyromegaly present.   Cardiovascular: Normal rate, regular rhythm, S1 normal, S2 normal and normal heart sounds.   No murmur heard. Pulmonary/Chest: Breath sounds normal. No stridor. No respiratory distress. She has no wheezes. She has no rales.  Musculoskeletal: She exhibits no edema.  Lymphadenopathy:       Head (right side): No tonsillar adenopathy present.       Head (left side): No tonsillar adenopathy present.    She has no cervical adenopathy.  Neurological: She is alert. Gait normal.  Skin: No rash noted. She is not diaphoretic. No erythema. Nails show no clubbing.  Psychiatric: Mood and affect normal.    Diagnostics:    Spirometry was performed and demonstrated an FEV1 of 1.26 at 71 % of predicted. Her previous FEV1 was 1.62.   Assessment and Plan:   1. Asthma, not well controlled, moderate persistent, with acute exacerbation   2. Other allergic rhinitis   3. LPRD (laryngopharyngeal reflux disease)     1. Allergen avoidance measures  2. Continue to Treat and prevent inflammation with the following:   A. Flonase - one spray each nostril twice a day EVERY DAY  B. Symbicort 160 - 2 puffs twice a day with spacer  C. montelukast 10 mg-  tablet 1 time per day  3. Continue to Treat and prevent reflux with the following:   A. Continue to consolidate all caffeine consumption slowly  B. Nexium 40 mg in AM  C. Ranitidine 300 mg in PM  4. If needed:   A. Ventolin HFA 2 puffs every 4-6 hours  B. Azelastine 1-2 puffs each nostril one-2 times per day  5. Finish the antibiotic treatment. Further antibiotics?   6. Prednisone 35m one tablet one time per day for 10 days only. Check blood sugars.  7. Return to clinic in 4 weeks or earlier if problem  I will assume that MKateleendeveloped a respiratory tract infection especially given the fact that she had chills and I will assume that she will resolve this issue as she uses her amoxicillin. I've given her a low dose of systemic steroids as she  does appear to have had a rather significant diminution in her spirometry compared to her previous reading. And I've encouraged her to consistently use Flonase as well as her other anti-inflammatory medications administered to her respiratory tract and continue to aggressively treat reflux as noted above. I will see her back in this clinic in 4 weeks or earlier if there is a problem.  EAllena Katz MD [facility]

## 2016-11-06 NOTE — Patient Instructions (Addendum)
  1. Allergen avoidance measures  2. Continue to Treat and prevent inflammation with the following:   A. Flonase - one spray each nostril twice a day EVERY DAY  B. Symbicort 160 - 2 puffs twice a day with spacer  C. montelukast 10 mg-  tablet 1 time per day  3. Continue to Treat and prevent reflux with the following:   A. Continue to consolidate all caffeine consumption slowly  B. Nexium 40 mg in AM  C. Ranitidine 300 mg in PM  4. If needed:   A. Ventolin HFA 2 puffs every 4-6 hours  B. Azelastine 1-2 puffs each nostril one-2 times per day  5. Finish the antibiotic treatment. Further antibiotics?   6. Prednisone 10mg  one tablet one time per day for 10 days only. Check blood sugars.  7. Return to clinic in 4 weeks or earlier if problem

## 2016-11-06 NOTE — Telephone Encounter (Signed)
Pt called and said that the Flonase suppose to be called into [facility]. [phone].

## 2016-11-07 ENCOUNTER — Encounter: Payer: Self-pay | Admitting: Allergy and Immunology

## 2016-11-07 MED ORDER — FLUTICASONE PROPIONATE 50 MCG/ACT NA SUSP: 1.0000 | Freq: Two times a day (BID) | NASAL | 3 refills | Status: DC

## 2016-11-07 NOTE — Telephone Encounter (Signed)
Completed and coming to your desk [facility]! [name] [name]

## 2016-11-07 NOTE — Telephone Encounter (Signed)
PA for metoprolol succinate completed online at www.[email or web address].  PA pending per [facility]; PA Case [id number].  Derl Barrow, RN

## 2016-11-08 NOTE — Telephone Encounter (Signed)
PA for metoprolol succinate approved via [employer] until 11/08/2017. Case number: [id number].  Derl Barrow, RN

## 2016-11-14 DIAGNOSIS — S52552D Other extraarticular fracture of lower end of left radius, subsequent encounter for closed fracture with routine healing: Secondary | ICD-10-CM | POA: Diagnosis not present

## 2016-11-20 ENCOUNTER — Telehealth: Payer: Self-pay | Admitting: Internal Medicine

## 2016-11-20 DIAGNOSIS — E785 Hyperlipidemia, unspecified: Secondary | ICD-10-CM

## 2016-11-20 NOTE — Telephone Encounter (Signed)
Per 09/23/16 TE, rx for patient to have lipids redrawn at PCPs office in 8 weeks (11/11/16) with results faxed to Dr. Harrington Challenger.  I LMTCB

## 2016-11-20 NOTE — Telephone Encounter (Signed)
I spoke with patient. I advised her of the previous message. She states she would rather have the lipids drawn here next week (due to weather).  I made appointment for 11/27/16, orders entered, and linked. She voiced understanding and agreed with plan.

## 2016-11-20 NOTE — Telephone Encounter (Signed)
New message    Pt is calling because she says when she was here last she was told 6 weeks after her visit she would need to have blood work. She says she was mailed an appt time but she miss placed the letter. She is asking if she should come in for blood work.

## 2016-11-22 ENCOUNTER — Ambulatory Visit (INDEPENDENT_AMBULATORY_CARE_PROVIDER_SITE_OTHER): Payer: Medicare HMO | Admitting: Obstetrics and Gynecology

## 2016-11-22 VITALS — BP 144/72 | HR 81 | Temp 97.7°F | Ht 61.0 in | Wt 202.2 lb

## 2016-11-22 DIAGNOSIS — M940 Chondrocostal junction syndrome [Tietze]: Secondary | ICD-10-CM

## 2016-11-22 MED ORDER — IBUPROFEN 600 MG PO TABS: 600.0000 mg | ORAL_TABLET | Freq: Three times a day (TID) | ORAL | 0 refills | Status: DC | PRN

## 2016-11-22 NOTE — Patient Instructions (Addendum)
Costochondritis Costochondritis is swelling and irritation (inflammation) of the tissue (cartilage) that connects your ribs to your breastbone (sternum). This causes pain in the front of your chest. Usually, the pain:  Starts gradually.  Is in more than one rib. This condition usually goes away on its own over time. Follow these instructions at home:  Do not do anything that makes your pain worse.  If directed, put ice on the painful area:  Put ice in a plastic bag.  Place a towel between your skin and the bag.  Leave the ice on for 20 minutes, 2-3 times a day.  If directed, put heat on the affected area as often as told by your doctor. Use the heat source that your doctor tells you to use, such as a moist heat pack or a heating pad.  Place a towel between your skin and the heat source.  Leave the heat on for 20-30 minutes.  Take off the heat if your skin turns bright red. This is very important if you cannot feel pain, heat, or cold. You may have a greater risk of getting burned.  Take over-the-counter and prescription medicines only as told by your doctor.  Return to your normal activities as told by your doctor. Ask your doctor what activities are safe for you.  Keep all follow-up visits as told by your doctor. This is important. Contact a doctor if:  You have chills or a fever.  Your pain does not go away or it gets worse.  You have a cough that does not go away. Get help right away if:  You are short of breath. This information is not intended to replace advice given to you by your health care provider. Make sure you discuss any questions you have with your health care provider. Document Released: 02/05/2008 Document Revised: 03/08/2016 Document Reviewed: 12/13/2015 Elsevier Interactive Patient Education  2017 [employer].

## 2016-11-22 NOTE — Telephone Encounter (Signed)
Pt had office visit today, and was reminded they were up front for her to pick up. Katharina Caper, April D, [place]

## 2016-11-22 NOTE — Progress Notes (Signed)
   Subjective:   Patient ID: [name] [name], female    DOB: [birth year], 66 y.o.   MRN: [id number]  Patient presents for Same Day Appointment  Chief Complaint  Patient presents with  . Chest Pain    HPI: # CHEST PAIN Chest pain began 1 day ago. Pain is localized to the middle of her chest Has pain with any movement of her chest such as bending or moving side to side Denies any recent trauma or injury; no increase in recent activity Denies arm or shoulder pain Nothing has been tried Radiation: no  Symptoms Nausea/vomiting: no Shortness of breath: no Pleuritic pain: no Cough: no Swelling of legs: no Syncope: no Heart burn or food sticking: yes Immobility: no  Review of Systems   See HPI for ROS.   History  Smoking Status  . Former Smoker  . Packs/day: 0.50  . Years: 0.50  . Types: Cigarettes  . Quit date: 09/03/1975  Smokeless Tobacco  . Never Used    Past medical history, surgical, family, and social history reviewed and updated in the EMR as appropriate.  Pertinent Historical Findings include: Objective:  BP (!) 144/72   Pulse 81   Temp 97.7 F (36.5 C) (Oral)   Ht 5\' 1"  (1.549 m)   Wt 202 lb 3.2 oz (91.7 kg)   SpO2 99%   BMI 38.21 kg/m  Vitals and nursing note reviewed  Physical Exam  Constitutional: She is well-developed, well-nourished, and in no distress.  Cardiovascular: Normal rate, regular rhythm and normal heart sounds.   Pulmonary/Chest: Effort normal and breath sounds normal. She has no wheezes. She has no rales. She exhibits tenderness.  Abdominal: Soft. She exhibits distension. There is no tenderness. There is no guarding.    Assessment & Plan:  1. Costochondritis Symptoms and physical exam most consistent with costochondritis. MSK etiology of chest pain. Atypical for ACS. Patient counseled on diagnosis. Rx given for ibuprofen for 7 days to use to help calm down inflammation. Had this condition before but was not given NSAIDs. Return  precautions discussed.    Diagnosis and plan along with any newly prescribed medication(s) were discussed in detail with this patient today. The patient verbalized understanding and agreed with the plan. Patient advised if symptoms worsen return to clinic or ER.   PATIENT EDUCATION PROVIDED: See AVS   Luiz Blare, DO 11/22/2016, 11:32 AM PGY-3, [place]

## 2016-11-24 DIAGNOSIS — G4733 Obstructive sleep apnea (adult) (pediatric): Secondary | ICD-10-CM | POA: Diagnosis not present

## 2016-11-27 ENCOUNTER — Other Ambulatory Visit: Payer: Medicare HMO | Admitting: *Deleted

## 2016-11-27 DIAGNOSIS — E785 Hyperlipidemia, unspecified: Secondary | ICD-10-CM | POA: Diagnosis not present

## 2016-11-27 LAB — LIPID PANEL
Chol/HDL Ratio: 4.3 ratio units (ref 0.0–4.4)
Cholesterol, Total: 121 mg/dL (ref 100–199)
HDL: 28 mg/dL — ABNORMAL LOW (ref >39)
LDL Calculated: 73 mg/dL (ref 0–99)
Triglycerides: 99 mg/dL (ref 0–149)
VLDL Cholesterol Cal: 20 mg/dL (ref 5–40)

## 2016-11-29 ENCOUNTER — Telehealth: Payer: Self-pay | Admitting: Internal Medicine

## 2016-11-29 NOTE — Telephone Encounter (Signed)
New Message ° ° pt verbalized that she is returning call for rn °

## 2016-11-29 NOTE — Telephone Encounter (Signed)
PT AWARE OF LAB RESULTS./CY 

## 2016-12-04 ENCOUNTER — Other Ambulatory Visit: Payer: Self-pay | Admitting: Family Medicine

## 2016-12-04 ENCOUNTER — Ambulatory Visit (INDEPENDENT_AMBULATORY_CARE_PROVIDER_SITE_OTHER): Payer: Medicare HMO | Admitting: Allergy and Immunology

## 2016-12-04 ENCOUNTER — Telehealth: Payer: Self-pay

## 2016-12-04 ENCOUNTER — Encounter: Payer: Self-pay | Admitting: Allergy and Immunology

## 2016-12-04 ENCOUNTER — Other Ambulatory Visit: Payer: Self-pay | Admitting: Pharmacy Technician

## 2016-12-04 VITALS — BP 118/60 | HR 80 | Resp 18

## 2016-12-04 DIAGNOSIS — J4540 Moderate persistent asthma, uncomplicated: Secondary | ICD-10-CM

## 2016-12-04 DIAGNOSIS — K30 Functional dyspepsia: Secondary | ICD-10-CM | POA: Diagnosis not present

## 2016-12-04 DIAGNOSIS — J3089 Other allergic rhinitis: Secondary | ICD-10-CM

## 2016-12-04 DIAGNOSIS — K219 Gastro-esophageal reflux disease without esophagitis: Secondary | ICD-10-CM | POA: Diagnosis not present

## 2016-12-04 MED ORDER — FLUTICASONE PROPIONATE 50 MCG/ACT NA SUSP: 2.0000 | Freq: Two times a day (BID) | NASAL | 5 refills | Status: DC

## 2016-12-04 NOTE — Telephone Encounter (Signed)
It is a radiology study so I guess [facility]

## 2016-12-04 NOTE — Telephone Encounter (Signed)
Reference number for PA: [id number]

## 2016-12-04 NOTE — Patient Instructions (Addendum)
  1. Obtain a gastric emptying scan  2. Continue to Treat and prevent inflammation with the following:   A. Flonase - one spray each nostril twice a day   B. Symbicort 160 - 2 puffs twice a day with spacer  C. montelukast 10 mg-  tablet 1 time per day  3. Continue to Treat and prevent reflux with the following:   A. Continue to consolidate all caffeine consumption slowly  B. Increase Nexium 40 mg TWO TIMES A DAY  C. Ranitidine 300 mg in PM  4. If needed:   A. Ventolin HFA 2 puffs every 4-6 hours  B. Azelastine 1-2 puffs each nostril one-2 times per day  5. Return to clinic in 12 weeks or earlier if problem  6. Revisit with Dr. Erik Obey to look at throat again.

## 2016-12-04 NOTE — Progress Notes (Signed)
Follow-up Note  Referring Provider: Dickie La, MD Primary Provider: [name], MD Date of Office Visit: 12/04/2016  Subjective:   [name] [name] (DOB: May 25, 1951) is a 66 y.o. female who returns to [facility] on 12/04/2016 in re-evaluation of the following:  HPI: [name] [name] returns to this clinic in reevaluation of her persistent respiratory tract issues revolving around inflammation and reflux-induced respiratory disease. Since I've last seen her in this clinic she contracted a respiratory tract infection that fortunately appears to have resolved. She still continues to have constant postnasal drip and inability to clear out her throat. She has minimal cough and in the morning she has a tickle in her throat and she has to clear it out. She still has this very "uncomfortable" sensation in her throat and her throat feels "puffy". She did have a check of her throat in 1062 with Dr. Erik Obey which apparently was normal.  Her reflux is still somewhat active. She will get a burning regurgitative event about 1 time per week. As well, she will vomit up food about once every week to once every 2 weeks as a spontaneous event without any trigger. She also feels a little bit of abdominal fullness and has bloating. She's been given Lynsiss for her chronic constipation by Dr. Collene Mares which may help this issue somewhat..  She continues on a large collection of medical therapy including anti-inflammatory medications for her respiratory tract and a combination of a proton pump inhibitor and H2 receptor blocker for her reflux.  Allergies as of 12/04/2016      Reactions   Bee Venom Hives, Swelling   Propoxyphene Hcl Itching   Amlodipine Besylate Swelling   Hydrocodone Other (See Comments)   With Vicodin - makes patient "jittery", and "hangover effect - sleepy next day"   Lisinopril Cough   Changed to ARB   Metformin And Related Diarrhea   GI distress   Metronidazole Other (See  Comments)   REACTION: "just didn't work; my body never did heal from it"   Valsartan Other (See Comments)   REACTION:  "sleep more the next day after I took it; it made me real tired"      Medication List      ACCU-CHEK AVIVA PLUS w/Device Kit Use to check blood sugar 3-4 times daily   ACCU-CHEK FASTCLIX LANCETS Misc TEST BLOOD SUGAR  TWO  TO THREE TIMES DAILY   acyclovir 200 MG capsule Commonly known as:  ZOVIRAX Take 200 mg by mouth 2 (two) times daily.   allopurinol 300 MG tablet Commonly known as:  ZYLOPRIM TAKE 1 TABLET EVERY DAY   ammonium lactate 12 % lotion Commonly known as:  LAC-HYDRIN Apply 1 application topically 2 (two) times daily as needed for dry skin.   amoxicillin 500 MG capsule Commonly known as:  AMOXIL Take 1 capsule (500 mg total) by mouth 3 (three) times daily.   aspirin EC 81 MG tablet Take 81 mg by mouth daily.   atorvastatin 40 MG tablet Commonly known as:  LIPITOR TAKE 1 TABLET EVERY DAY   azelastine 0.1 % nasal spray Commonly known as:  ASTELIN USE 2 SPRAYS IN EACH NOSTRIL EVERY DAY AS NEEDED AS DIRECTED   B-D SINGLE USE SWABS REGULAR Pads USE THREE TIMES DAILY   B-D ULTRAFINE III SHORT PEN 31G X 8 MM Misc Generic drug:  Insulin Pen Needle USE THREE TIMES DAILY   budesonide-formoterol 160-4.5 MCG/ACT inhaler Commonly known as:  SYMBICORT Inhale  2 puffs into the lungs 2 (two) times daily.   buPROPion 100 MG tablet Commonly known as:  WELLBUTRIN Take 1 tablet (100 mg total) by mouth 2 (two) times daily.   Cholecalciferol 50000 units capsule Take one by mouth weekly   COMBIGAN 0.2-0.5 % ophthalmic solution Generic drug:  brimonidine-timolol Place 1 drop into the left eye every 12 (twelve) hours.   Dextromethorphan-Guaifenesin 10-100 MG/5ML liquid Take 5 mLs by mouth every 12 (twelve) hours.   EPINEPHrine 0.3 mg/0.3 mL Soaj injection Commonly known as:  EPI-PEN Inject 0.3 mLs (0.3 mg total) into the muscle once as needed  (for allergic reaction).   esomeprazole 40 MG capsule Commonly known as:  NEXIUM TAKE 1 CAPSULE ONE TIME DAILY   ezetimibe 10 MG tablet Commonly known as:  ZETIA Take 1 tablet (10 mg total) by mouth daily.   fluticasone 50 MCG/ACT nasal spray Commonly known as:  FLONASE Place 1 spray into both nostrils 2 (two) times daily.   fluticasone 50 MCG/ACT nasal spray Commonly known as:  FLONASE Place 1 spray into both nostrils 2 (two) times daily.   furosemide 40 MG tablet Commonly known as:  LASIX TAKE 1 TABLET EVERY DAY   glucose blood test strip Test blood sugar 3-4 times daily   ibuprofen 600 MG tablet Commonly known as:  ADVIL,MOTRIN Take 1 tablet (600 mg total) by mouth every 8 (eight) hours as needed.   LANTUS SOLOSTAR 100 UNIT/ML Solostar Pen Generic drug:  Insulin Glargine Inject 50 units daily into skin for diabetes   latanoprost 0.005 % ophthalmic solution Commonly known as:  XALATAN Place 1 drop into both eyes at bedtime.   LINZESS 290 MCG Caps capsule Generic drug:  linaclotide   losartan 50 MG tablet Commonly known as:  COZAAR TAKE 1 TABLET EVERY DAY   metoprolol succinate 50 MG 24 hr tablet Commonly known as:  TOPROL-XL TAKE 3 TABLETS EVERY DAY  WITH  OR  IMMEDIATELY  FOLLOWING A MEAL   montelukast 10 MG tablet Commonly known as:  SINGULAIR TAKE 1 TABLET EVERY DAY   MULTIVITAMIN PO Take 1 tablet by mouth daily.   nitroGLYCERIN 0.4 MG SL tablet Commonly known as:  NITROSTAT Place 1 tablet (0.4 mg total) under the tongue every 5 (five) minutes as needed for chest pain.   NOVOLOG FLEXPEN 100 UNIT/ML FlexPen Generic drug:  insulin aspart INJECT 30 UNITS SUBCUTANEOUSLY WITH THE EARLY MEAL AND 30 UNITS WITH THE LATE MEAL   ranitidine 300 MG tablet Commonly known as:  ZANTAC Take 1 tablet (300 mg total) by mouth at bedtime.   salicylic acid 17 % gel Apply topically 2 (two) times daily. Apply small amount to callous area on bottom of foot BID     sertraline 100 MG tablet Commonly known as:  ZOLOFT Take 1 tablet (100 mg total) by mouth daily.   spironolactone 25 MG tablet Commonly known as:  ALDACTONE TAKE 1 TABLET EVERY DAY BEFORE SUPPER   traMADol 50 MG tablet Commonly known as:  ULTRAM TAKE 1 TO 2 TABLETS BY MOUTH EVERY 8 HOURS AS NEEDED FOR PAIN (MAX 6 TABLETS PER 24 HOURS)   TRULICITY 3.29 JM/4.2AS Sopn Generic drug:  Dulaglutide INJECT  0.75MG  (0.5ML) SUBCUTANEOUSLY ONE TIME WEEKLY   VENTOLIN HFA 108 (90 Base) MCG/ACT inhaler Generic drug:  albuterol INHALE 2 PUFFS INTO THE LUNGS EVERY 6  HOURS AS NEEDED FOR WHEEZING OR SHORTNESS OF BREATH.       Past Medical History:  Diagnosis  Date  . Allergy   . Angina   . Anxiety   . Arthritis   . Asthma   . Blood transfusion   . Blood transfusion without reported diagnosis   . Cataract   . Chronic cough    Now followed by pulmonary  . Chronic lower back pain   . Complication of anesthesia    "just wake up coughing; that's all"  . Concussion 11/18/11   "fell at dr's office; hit head"  . COPD (chronic obstructive pulmonary disease) ([place])   . Depression   . Diabetes mellitus type II    "take insulin & pills"  . Dyslipidemia   . Exertional dyspnea   . GERD (gastroesophageal reflux disease)   . Glaucoma   . Gout   . Headache(784.0) 11/18/11   "I've had mild headaches the last couple days"  . Headache(784.0) 01/08/12   "pretty constant since 11/18/11's concussion"  . Heart murmur   . History of colonoscopy   . HTN (hypertension)   . Myocardial infarction   . Osteoporosis   . Peripheral neuropathy ([place])   . Pneumonia    "i've had it once" (11/18/11)  . Sleep apnea    on CPAP 12  . Syncope 06/16/2012  . Vocal cord paralysis, unilateral partial    "right"    Past Surgical History:  Procedure Laterality Date  . ANTERIOR CERVICAL DECOMP/DISCECTOMY FUSION  01/15/2012   Procedure: ANTERIOR CERVICAL DECOMPRESSION/DISCECTOMY FUSION 3 LEVELS;  Surgeon: Eustace Moore, MD;  Location: [facility];  Service: Neurosurgery;  Laterality: N/A;  Anterior Cervical Decompression Discectomy Fusion Cerivcal three four, cervical four five, cervical five six, cervical six seven  . BREAST BIOPSY     bilaterally  . BREAST CYST EXCISION     right twice; left once  . BREAST REDUCTION SURGERY  04-21-2003  . BRONCHOSCOPY  07-2007  . CARDIAC CATHETERIZATION  03-02-2004  . CARPAL TUNNEL RELEASE     left  . CATARACT EXTRACTION  1955; 1979   bilateral; right eye  . CESAREAN SECTION  1987  . COLONOSCOPY W/ BIOPSIES  11/21/2010   adenoma polyp--no hi grade dysplasia Dr Collene Mares  . EYE SURGERY    . KNEE ARTHROSCOPY  08/2000   right  . LEFT AND RIGHT HEART CATHETERIZATION WITH CORONARY ANGIOGRAM N/A 01/19/2014   Procedure: LEFT AND RIGHT HEART CATHETERIZATION WITH CORONARY ANGIOGRAM;  Surgeon: Sinclair Grooms, MD;  Location: [facility] C[facility];  Service: Cardiovascular;  Laterality: N/A;  . SHOULDER ARTHROSCOPY  08/2000   right  . SHOULDER ARTHROSCOPY W/ ROTATOR CUFF REPAIR  10/18/2003   left  . SPINE SURGERY    . T-score  09/02/04   -0.54 (low risk currently)  . TOTAL ABDOMINAL HYSTERECTOMY  10-07-2000  . TREATMENT FISTULA ANAL    . TUBAL LIGATION  1987    Review of systems negative except as noted in HPI / PMHx or noted below:  Review of Systems  Constitutional: Negative.   HENT: Negative.   Eyes: Negative.   Respiratory: Negative.   Cardiovascular: Negative.   Gastrointestinal: Negative.   Genitourinary: Negative.   Musculoskeletal: Negative.   Skin: Negative.   Neurological: Negative.   Endo/Heme/Allergies: Negative.   Psychiatric/Behavioral: Negative.      Objective:   Vitals:   12/04/16 1056  BP: 118/60  Pulse: 80  Resp: 18          Physical Exam  Constitutional: She is well-developed, well-nourished, and in no distress.  Raspy voice,  throat clearing  HENT:  Head: Normocephalic.  Right Ear: Tympanic membrane, external ear and ear canal normal.    Left Ear: Tympanic membrane, external ear and ear canal normal.  Nose: Nose normal. No mucosal edema or rhinorrhea.  Mouth/Throat: Uvula is midline, oropharynx is clear and moist and mucous membranes are normal. No oropharyngeal exudate.  Eyes: Conjunctivae are normal.  Neck: Trachea normal. No tracheal tenderness present. No tracheal deviation present. No thyromegaly present.  Cardiovascular: Normal rate, regular rhythm, S1 normal, S2 normal and normal heart sounds.   No murmur heard. Pulmonary/Chest: Breath sounds normal. No stridor. No respiratory distress. She has no wheezes. She has no rales.  Musculoskeletal: She exhibits no edema.  Lymphadenopathy:       Head (right side): No tonsillar adenopathy present.       Head (left side): No tonsillar adenopathy present.    She has no cervical adenopathy.  Neurological: She is alert. Gait normal.  Skin: No rash noted. She is not diaphoretic. No erythema. Nails show no clubbing.  Psychiatric: Mood and affect normal.    Diagnostics:    Spirometry was performed and demonstrated an FEV1 of 1.38 at 80 % of predicted.  Assessment and Plan:   1. Asthma, moderate persistent, well-controlled   2. Other allergic rhinitis   3. LPRD (laryngopharyngeal reflux disease)   4. Delayed gastric emptying     1. Obtain a gastric emptying scan  2. Continue to Treat and prevent inflammation with the following:   A. Flonase - one spray each nostril twice a day   B. Symbicort 160 - 2 puffs twice a day with spacer  C. montelukast 10 mg-  tablet 1 time per day  3. Continue to Treat and prevent reflux with the following:   A. Continue to consolidate all caffeine consumption slowly  B. Increase Nexium 40 mg TWO TIMES A DAY  C. Ranitidine 300 mg in PM  4. If needed:   A. Ventolin HFA 2 puffs every 4-6 hours  B. Azelastine 1-2 puffs each nostril one-2 times per day  5. Return to clinic in 12 weeks or earlier if problem  6. Revisit with Dr. Erik Obey  to look at throat again.   Sneha will increase her therapy for reflux assuming that her continued respiratory tract problems are secondary to this insult and I would like to have her throat examined by her ENT doctor as her last examination was over 5 years ago. As well, there is the possibility that she may have delayed gastric emptying based on her history and we'll see if that's the case by obtaining a gastric emptying scan and if so then we'll probably end up treating her with promotility agents with the understanding that these agents are associated with the potential for side effects. I will keep her on anti-inflammatory medications for her respiratory tract as noted above. I'll contact her once the results of her gastric emptying scan are available and I can review Dr. Noreene Filbert evaluation.  Allena Katz, MD Allergy / Immunology [place]

## 2016-12-04 NOTE — Telephone Encounter (Signed)
You requested a gastric emptying scan. I am not sure how to order this for the patient. Please advise and thank you.

## 2016-12-04 NOTE — Telephone Encounter (Signed)
Order has been entered. No PA required for this study.

## 2016-12-04 NOTE — Addendum Note (Signed)
Addended by: Lucrezia Starch I on: 12/04/2016 05:06 PM   Modules accepted: Orders

## 2016-12-11 ENCOUNTER — Other Ambulatory Visit: Payer: Self-pay | Admitting: Family Medicine

## 2016-12-11 ENCOUNTER — Telehealth: Payer: Self-pay | Admitting: *Deleted

## 2016-12-11 DIAGNOSIS — E1149 Type 2 diabetes mellitus with other diabetic neurological complication: Secondary | ICD-10-CM

## 2016-12-11 NOTE — Telephone Encounter (Signed)
Received call from [facility] imaging authorization department requested a PA for Daniah's upcoming Gastric Emptying Study. I have confirmed with [employer] that there is no requirement for a PA. A courtesy number was provided: [phone]

## 2016-12-13 ENCOUNTER — Ambulatory Visit (HOSPITAL_COMMUNITY)
Admission: RE | Admit: 2016-12-13 | Discharge: 2016-12-13 | Disposition: A | Discharge status: Home / Self Care | Payer: Medicare HMO | Source: Ambulatory Visit | Attending: Allergy and Immunology | Admitting: Allergy and Immunology

## 2016-12-13 DIAGNOSIS — K30 Functional dyspepsia: Secondary | ICD-10-CM | POA: Diagnosis not present

## 2016-12-13 DIAGNOSIS — R110 Nausea: Secondary | ICD-10-CM | POA: Diagnosis not present

## 2016-12-13 MED ORDER — TECHNETIUM TC 99M SULFUR COLLOID: 2.0000 | Freq: Once | INTRAVENOUS | Status: DC | PRN

## 2016-12-17 ENCOUNTER — Other Ambulatory Visit: Payer: Self-pay | Admitting: Family Medicine

## 2016-12-17 NOTE — Telephone Encounter (Signed)
Pt is calling because she needs [place] to check the the trulicity in the 1.5 mg and also they have not received her Lantus as of yet. They did receive the other medications. jw

## 2016-12-18 NOTE — Telephone Encounter (Signed)
Fax received [employer] stating that patient has an active script for symbicort 160-4.5mg  AER and they just received her script for Flovent HFA.  They are needing clarification on which one needs to be the most current therapy option for patient.  Form in provider's box to clarify and fax back to pharmacy. Jazmin Hartsell,CMA

## 2016-12-19 ENCOUNTER — Telehealth: Payer: Self-pay | Admitting: Allergy and Immunology

## 2016-12-19 NOTE — Telephone Encounter (Signed)
I have rerouted the gastric scan to Dr. Lorie Apley office. Left message for patient

## 2016-12-19 NOTE — Telephone Encounter (Signed)
Pt called and said that dr Collene Mares office does not have the labs that she had done at [facility] [place] try to send Monday but they were down.

## 2016-12-20 NOTE — Telephone Encounter (Signed)
Faxed for back to [facility] giving them the below message from Dr. Nori Riis. Tacy Dura Rumple, April D, CMA

## 2016-12-20 NOTE — Telephone Encounter (Signed)
I am not prescribiong that--they need to send that question to her allergist, Dr Rosana Fret! [name] [name]

## 2016-12-25 ENCOUNTER — Other Ambulatory Visit: Payer: Self-pay | Admitting: Family Medicine

## 2016-12-25 MED ORDER — GLUCOSE BLOOD VI STRP: ORAL_STRIP | 12 refills | Status: DC

## 2016-12-25 MED ORDER — RANITIDINE HCL 300 MG PO TABS: 300.0000 mg | ORAL_TABLET | Freq: Every day | ORAL | 1 refills | Status: DC

## 2016-12-25 MED ORDER — ACYCLOVIR 200 MG PO CAPS: 200.0000 mg | ORAL_CAPSULE | Freq: Two times a day (BID) | ORAL | 5 refills | Status: DC

## 2016-12-25 MED ORDER — PRODIGY SAFETY LANCETS 26G MISC: 3 refills | Status: DC

## 2016-12-25 MED ORDER — PRODIGY LANCING DEVICE MISC: 0 refills | Status: DC

## 2016-12-25 NOTE — Telephone Encounter (Signed)
Refill medications.  Will forward to PCP regarding the Trulicity.  Derl Barrow, RN

## 2016-12-25 NOTE — Telephone Encounter (Signed)
Pt needs refills on Prodigy no coding test strips, lancets, and the lancet holder. Pt also needs refills on Zantac & Zovirax. Pt was prescribed a gel and insurance wouldn't cover it and needs something similar.  Pt received Trulicity .75 and pt states she needed 1.5, so pt wants to know if she can take it twice a week to use it up. Pt uses [employer]. ep

## 2017-01-01 ENCOUNTER — Telehealth: Payer: Self-pay | Admitting: Family Medicine

## 2017-01-01 NOTE — Telephone Encounter (Signed)
[name] [name] did this already

## 2017-01-01 NOTE — Telephone Encounter (Signed)
Pt called and needs a refill on her test strips called into [facility] mail order [facility]. jw

## 2017-01-07 ENCOUNTER — Telehealth: Payer: Self-pay | Admitting: *Deleted

## 2017-01-07 NOTE — Telephone Encounter (Signed)
Prior Authorization received from [place] for [employer]. PA completed online at www.covermymedscom. PA was denied under Medicare Part D, but approved coverage under Medicare Part B.  [employer] has approved coverage for diabetic supplies under Part B benefit for 2 years.  Derl Barrow, RN

## 2017-01-09 ENCOUNTER — Other Ambulatory Visit: Payer: Self-pay | Admitting: Gastroenterology

## 2017-01-09 ENCOUNTER — Ambulatory Visit (INDEPENDENT_AMBULATORY_CARE_PROVIDER_SITE_OTHER): Payer: Medicare HMO | Admitting: Psychiatry

## 2017-01-09 ENCOUNTER — Encounter (HOSPITAL_COMMUNITY): Payer: Self-pay | Admitting: Psychiatry

## 2017-01-09 DIAGNOSIS — Z86010 Personal history of colonic polyps: Secondary | ICD-10-CM | POA: Diagnosis not present

## 2017-01-09 DIAGNOSIS — Z87891 Personal history of nicotine dependence: Secondary | ICD-10-CM

## 2017-01-09 DIAGNOSIS — F411 Generalized anxiety disorder: Secondary | ICD-10-CM

## 2017-01-09 DIAGNOSIS — F331 Major depressive disorder, recurrent, moderate: Secondary | ICD-10-CM

## 2017-01-09 DIAGNOSIS — R1310 Dysphagia, unspecified: Secondary | ICD-10-CM

## 2017-01-09 DIAGNOSIS — K3184 Gastroparesis: Secondary | ICD-10-CM | POA: Diagnosis not present

## 2017-01-09 DIAGNOSIS — K219 Gastro-esophageal reflux disease without esophagitis: Secondary | ICD-10-CM | POA: Diagnosis not present

## 2017-01-09 DIAGNOSIS — K5730 Diverticulosis of large intestine without perforation or abscess without bleeding: Secondary | ICD-10-CM | POA: Diagnosis not present

## 2017-01-09 MED ORDER — SERTRALINE HCL 100 MG PO TABS: 100.0000 mg | ORAL_TABLET | Freq: Every day | ORAL | 0 refills | Status: DC

## 2017-01-09 MED ORDER — BUPROPION HCL 100 MG PO TABS: 100.0000 mg | ORAL_TABLET | Freq: Two times a day (BID) | ORAL | 0 refills | Status: DC

## 2017-01-09 NOTE — Progress Notes (Signed)
BH MD/PA/NP OP Progress Note  01/09/2017 2:26 PM [name] [name]  MRN:  [id number]  Chief Complaint:  Chief Complaint    Follow-up      HPI: Depression has been good. She only gets sad when she thinks about her dad who passed away. She is busy most days of the week. 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 She denies worthlessness and hopelessness. Denies SI/HI.  She denies anxiety.  She is sleeping ok with her CPAP. Energy is limited.  Taking meds as prescribed and reports SE of sleepiness.    Visit Diagnosis:    ICD-9-CM ICD-10-CM   1. MDD (major depressive disorder), recurrent episode, moderate (HCC) 296.32 F33.1 sertraline (ZOLOFT) 100 MG tablet     buPROPion (WELLBUTRIN) 100 MG tablet  2. GAD (generalized anxiety disorder) 300.02 F41.1 sertraline (ZOLOFT) 100 MG tablet     buPROPion (WELLBUTRIN) 100 MG tablet      Past Psychiatric History:  Anxiety: Yes Bipolar Disorder: No Depression: Yes Mania: No Psychosis: No Schizophrenia: No Personality Disorder: No Hospitalization for psychiatric illness: No History of Electroconvulsive Shock Therapy: No Prior Suicide Attempts: No  Past Medical History:  Past Medical History:  Diagnosis Date  . Allergy   . Angina   . Anxiety   . Arthritis   . Asthma   . Blood transfusion   . Blood transfusion without reported diagnosis   . Cataract   . Chronic cough    Now followed by pulmonary  . Chronic lower back pain   . Complication of anesthesia    "just wake up coughing; that's all"  . Concussion 11/18/11   "fell at dr's office; hit head"  . COPD (chronic obstructive pulmonary disease) ([place])   . Depression   . Diabetes mellitus type II    "take insulin & pills"  . Dyslipidemia   . Exertional dyspnea   . GERD (gastroesophageal reflux disease)   . Glaucoma   . Gout   . Headache(784.0) 11/18/11   "I've had mild headaches the last couple days"  . Headache(784.0) 01/08/12   "pretty constant since  11/18/11's concussion"  . Heart murmur   . History of colonoscopy   . HTN (hypertension)   . Myocardial infarction ([place])   . Osteoporosis   . Peripheral neuropathy   . Pneumonia    "i've had it once" (11/18/11)  . Sleep apnea    on CPAP 12  . Syncope 06/16/2012  . Vocal cord paralysis, unilateral partial    "right"    Past Surgical History:  Procedure Laterality Date  . ANTERIOR CERVICAL DECOMP/DISCECTOMY FUSION  01/15/2012   Procedure: ANTERIOR CERVICAL DECOMPRESSION/DISCECTOMY FUSION 3 LEVELS;  Surgeon: Eustace Moore, MD;  Location: [facility];  Service: Neurosurgery;  Laterality: N/A;  Anterior Cervical Decompression Discectomy Fusion Cerivcal three four, cervical four five, cervical five six, cervical six seven  . BREAST BIOPSY     bilaterally  . BREAST CYST EXCISION     right twice; left once  . BREAST REDUCTION SURGERY  04-21-2003  . BRONCHOSCOPY  07-2007  . CARDIAC CATHETERIZATION  03-02-2004  . CARPAL TUNNEL RELEASE     left  . CATARACT EXTRACTION  1955; 1979   bilateral; right eye  . CESAREAN SECTION  1987  . COLONOSCOPY W/ BIOPSIES  11/21/2010   adenoma polyp--no hi grade dysplasia Dr Collene Mares  . EYE SURGERY    . KNEE ARTHROSCOPY  08/2000   right  . LEFT AND RIGHT HEART  CATHETERIZATION WITH CORONARY ANGIOGRAM N/A 01/19/2014   Procedure: LEFT AND RIGHT HEART CATHETERIZATION WITH CORONARY ANGIOGRAM;  Surgeon: Sinclair Grooms, MD;  Location: [facility] CATH LAB;  Service: Cardiovascular;  Laterality: N/A;  . SHOULDER ARTHROSCOPY  08/2000   right  . SHOULDER ARTHROSCOPY W/ ROTATOR CUFF REPAIR  10/18/2003   left  . SPINE SURGERY    . T-score  09/02/04   -0.54 (low risk currently)  . TOTAL ABDOMINAL HYSTERECTOMY  10-07-2000  . TREATMENT FISTULA ANAL    . TUBAL LIGATION  1987    Family Psychiatric and Medical History:  Family History  Problem Relation Age of Onset  . Diabetes Mother   . Colon cancer Neg Hx   . Depression Neg Hx   . Anxiety disorder Neg Hx     Social History:   Social History   Social History  . Marital status: Single    Spouse name: N/A  . Number of children: 1  . Years of education: 19   Occupational History  . unemployed Unemployed   Social History Main Topics  . Smoking status: Former Smoker    Packs/day: 0.50    Years: 0.50    Types: Cigarettes    Quit date: 09/03/1975  . Smokeless tobacco: Never Used  . Alcohol use No  . Drug use: No  . Sexual activity: Not Currently   Other Topics Concern  . Not on file   Social History Narrative   Approved for section 8 housing (12/03).         Health Care POA:    Emergency Contact: sister, catherine Naumann [phone]   End of Life Plan:    Who lives with you: Has a son who may be autistic and stays with her at times.   Any pets: none   Diet: Pt has a varied diet of protein, starch, and vegetables.   Exercise: Pt walks 1x a week with [place] [facility].   Seatbelts: Pt reports wearing seatbelt when in vehicles.    Hobbies: walking, tv, church, eating          Allergies:  Allergies  Allergen Reactions  . Bee Venom Hives and Swelling  . Propoxyphene Hcl Itching  . Amlodipine Besylate Swelling  . Hydrocodone Other (See Comments)    With Vicodin - makes patient "jittery", and "hangover effect - sleepy next day"  . Lisinopril Cough    Changed to ARB  . Metformin And Related Diarrhea    GI distress  . Metronidazole Other (See Comments)    REACTION: "just didn't work; my body never did heal from it"  . Valsartan Other (See Comments)    REACTION:  "sleep more the next day after I took it; it made me real tired"    Metabolic Disorder Labs: Lab Results  Component Value Date   HGBA1C 8.7 09/11/2016   MPG 134 (H) 06/17/2012   No results found for: PROLACTIN Lab Results  Component Value Date   CHOL 121 11/27/2016   TRIG 99 11/27/2016   HDL 28 (L) 11/27/2016   CHOLHDL 4.3 11/27/2016   VLDL 16 10/20/2015   LDLCALC 73 11/27/2016   LDLCALC 72  10/20/2015     Current Medications: Current Outpatient Prescriptions  Medication Sig Dispense Refill  . ACCU-CHEK FASTCLIX LANCETS MISC TEST BLOOD SUGAR  TWO  TO THREE TIMES DAILY 306 each 5  . acyclovir (ZOVIRAX) 200 MG capsule Take 1 capsule (200 mg total) by mouth 2 (two) times daily.  120 capsule 5  . Alcohol Swabs (B-D SINGLE USE SWABS REGULAR) PADS USE THREE TIMES DAILY 300 each 3  . allopurinol (ZYLOPRIM) 300 MG tablet TAKE 1 TABLET EVERY DAY 90 tablet 3  . ammonium lactate (LAC-HYDRIN) 12 % lotion Apply 1 application topically 2 (two) times daily as needed for dry skin. 400 g 12  . aspirin EC 81 MG tablet Take 81 mg by mouth daily.    [name] atorvastatin (LIPITOR) 40 MG tablet TAKE 1 TABLET EVERY DAY 90 tablet 3  . azelastine (ASTELIN) 0.1 % nasal spray USE 2 SPRAYS IN EACH NOSTRIL EVERY DAY AS NEEDED AS DIRECTED 60 mL 3  . B-D ULTRAFINE III SHORT PEN 31G X 8 MM MISC USE THREE TIMES DAILY 270 each 3  . Blood Glucose Monitoring Suppl (ACCU-CHEK AVIVA PLUS) W/DEVICE KIT Use to check blood sugar 3-4 times daily 1 kit 0  . budesonide-formoterol (SYMBICORT) 160-4.5 MCG/ACT inhaler Inhale 2 puffs into the lungs 2 (two) times daily. 3 Inhaler 1  . buPROPion (WELLBUTRIN) 100 MG tablet Take 1 tablet (100 mg total) by mouth 2 (two) times daily. 180 tablet 0  . Cholecalciferol 50000 units capsule Take one by mouth weekly 12 capsule 3  . COMBIGAN 0.2-0.5 % ophthalmic solution Place 1 drop into the left eye every 12 (twelve) hours.     . Dextromethorphan-Guaifenesin 10-100 MG/5ML liquid Take 5 mLs by mouth every 12 (twelve) hours. 118 mL 0  . EPINEPHrine 0.3 mg/0.3 mL IJ SOAJ injection Inject 0.3 mLs (0.3 mg total) into the muscle once as needed (for allergic reaction). 1 Device 3  . esomeprazole (NEXIUM) 40 MG capsule TAKE 1 CAPSULE EVERY DAY 90 capsule 3  . ezetimibe (ZETIA) 10 MG tablet Take 1 tablet (10 mg total) by mouth daily. 90 tablet 3  . fluticasone (FLONASE) 50 MCG/ACT nasal spray Place 1  spray into both nostrils 2 (two) times daily. 16 g 3  . fluticasone (FLONASE) 50 MCG/ACT nasal spray Place 2 sprays into both nostrils 2 (two) times daily. 16 g 5  . furosemide (LASIX) 40 MG tablet TAKE 1 TABLET EVERY DAY 90 tablet 3  . glucose blood (PRODIGY NO CODING BLOOD GLUC) test strip Use as instructed to blood sugar three times a day.  ICD 10-code: E11.65. 100 each 12  . ibuprofen (ADVIL,MOTRIN) 600 MG tablet Take 1 tablet (600 mg total) by mouth every 8 (eight) hours as needed. 30 tablet 0  . Lancet Devices (PRODIGY LANCING DEVICE) MISC Use as directed. 1 each 0  . LANTUS SOLOSTAR 100 UNIT/ML Solostar Pen INJECT 60 UNITS INTO THE SKIN DAILY AT 10PM. THEN DECREASE TO 50 UNITS WHEN NEW DOSE OF TRULICITY COMES IN. 60 mL 3  . latanoprost (XALATAN) 0.005 % ophthalmic solution Place 1 drop into both eyes at bedtime.    [name] LINZESS 290 MCG CAPS capsule     . losartan (COZAAR) 50 MG tablet TAKE 1 TABLET EVERY DAY 90 tablet 3  . metoprolol succinate (TOPROL-XL) 50 MG 24 hr tablet TAKE 3 TABLETS EVERY DAY  WITH  OR  IMMEDIATELY  FOLLOWING A MEAL 270 tablet 3  . montelukast (SINGULAIR) 10 MG tablet TAKE 1 TABLET EVERY DAY 90 tablet 3  . Multiple Vitamins-Minerals (MULTIVITAMIN PO) Take 1 tablet by mouth daily.    . nitroGLYCERIN (NITROSTAT) 0.4 MG SL tablet Place 1 tablet (0.4 mg total) under the tongue every 5 (five) minutes as needed for chest pain. 50 tablet 3  . NOVOLOG FLEXPEN 100 UNIT/ML  FlexPen INJECT 30 UNITS SUBCUTANEOUSLY WITH THE EARLY MEAL AND 30 UNITS WITH THE LATE MEAL 60 mL 5  . PRODIGY SAFETY LANCETS 26G MISC Use to test blood sugar three times a day. ICD-10 Code: E11.65 100 each 3  . ranitidine (ZANTAC) 300 MG tablet Take 1 tablet (300 mg total) by mouth at bedtime. 90 tablet 1  . salicylic acid 17 % gel Apply topically 2 (two) times daily. Apply small amount to callous area on bottom of foot BID 14 g 3  . sertraline (ZOLOFT) 100 MG tablet Take 1 tablet (100 mg total) by mouth daily.  90 tablet 0  . spironolactone (ALDACTONE) 25 MG tablet TAKE 1 TABLET EVERY DAY BEFORE SUPPER 90 tablet 3  . traMADol (ULTRAM) 50 MG tablet TAKE 1 TO 2 TABLETS BY MOUTH EVERY 8 HOURS AS NEEDED FOR PAIN (MAX 6 TABLETS PER 24 HOURS) 180 tablet 5  . TRULICITY 7.82 NF/6.2ZH SOPN INJECT  0.75MG  (0.5ML) SUBCUTANEOUSLY ONE TIME WEEKLY 12 pen 12  . VENTOLIN HFA 108 (90 Base) MCG/ACT inhaler INHALE 2 PUFFS INTO THE LUNGS EVERY 6  HOURS AS NEEDED FOR WHEEZING OR SHORTNESS OF BREATH. 54 g 3   No current facility-administered medications for this visit.      Musculoskeletal: Strength & Muscle Tone: within normal limits Gait & Station: unsteady, walks with cane Patient leans: N/A  Psychiatric Specialty Exam: Review of Systems  Gastrointestinal: Negative for abdominal pain, heartburn, nausea and vomiting.  Musculoskeletal: Positive for back pain. Negative for joint pain and neck pain.  Neurological: Positive for dizziness, sensory change and headaches.  Psychiatric/Behavioral: Negative for depression, hallucinations, substance abuse and suicidal ideas. The patient is not nervous/anxious and does not have insomnia.     Blood pressure 138/76, pulse 88, height 5' 1"  (1.549 m), weight 204 lb (92.5 kg).Body mass index is 38.55 kg/m.  General Appearance: Fairly Groomed  Eye Contact:  Minimal- glaucoma and difficulty seeing despite using her glasses  Speech:  Clear and Coherent and Normal Rate  Volume:  Normal  Mood:  Euthymic  Affect:  Appropriate and Full Range  Thought Process:  Goal Directed and Descriptions of Associations: Intact  Orientation:  Full (Time, Place, and Person)  Thought Content: WDL   Suicidal Thoughts:  No  Homicidal Thoughts:  No  Memory:  Immediate;   Good Recent;   Good Remote;   Good  Judgement:  Good  Insight:  Good  Psychomotor Activity:  Normal  Concentration:  Concentration: Good and Attention Span: Good  Recall:  Good  Fund of Knowledge: Good  Language: Good   Akathisia:  No  Handed:  Right  AIMS (if indicated):  n/a  Assets:  Communication Skills Desire for Improvement Housing  ADL's:  Intact  Cognition: WNL  Sleep:  good     Treatment Plan Summary:Medication management  Assessment: MDD-recurrent, mild; GAD   Medication management with supportive therapy. Risks/benefits and SE of the medication discussed. Pt verbalized understanding and verbal consent obtained for treatment.  Affirm with the patient that the medications are taken as ordered. Patient expressed understanding of how their medications were to be used.    Meds: Wellbutrin 138m po BID for depression Zoloft 10109mpo qAM for depression and anxiety   Labs: none   Therapy: brief supportive therapy provided. Discussed psychosocial stressors in detail.   Encouraged pt to develop daily routine and work on daily goal setting as a way to improve mood symptoms.    Consultations: none  Pt  denies SI and is at an acute low risk for suicide. Patient told to call clinic if any problems occur. Patient advised to go to ER if they should develop SI/HI, side effects, or if symptoms worsen. Has crisis numbers to call if needed. Pt verbalized understanding.  F/up in 3-4 months or sooner if needed  Charlcie Cradle, MD 01/09/2017, 2:26 PM

## 2017-01-16 ENCOUNTER — Ambulatory Visit (INDEPENDENT_AMBULATORY_CARE_PROVIDER_SITE_OTHER): Payer: Medicare HMO | Admitting: Family Medicine

## 2017-01-16 ENCOUNTER — Encounter: Payer: Self-pay | Admitting: Family Medicine

## 2017-01-16 DIAGNOSIS — E1149 Type 2 diabetes mellitus with other diabetic neurological complication: Secondary | ICD-10-CM | POA: Diagnosis not present

## 2017-01-16 DIAGNOSIS — Z9989 Dependence on other enabling machines and devices: Secondary | ICD-10-CM | POA: Diagnosis not present

## 2017-01-16 DIAGNOSIS — K219 Gastro-esophageal reflux disease without esophagitis: Secondary | ICD-10-CM | POA: Diagnosis not present

## 2017-01-16 DIAGNOSIS — R490 Dysphonia: Secondary | ICD-10-CM | POA: Insufficient documentation

## 2017-01-16 DIAGNOSIS — H9313 Tinnitus, bilateral: Secondary | ICD-10-CM | POA: Insufficient documentation

## 2017-01-16 DIAGNOSIS — G4733 Obstructive sleep apnea (adult) (pediatric): Secondary | ICD-10-CM | POA: Diagnosis not present

## 2017-01-16 DIAGNOSIS — H903 Sensorineural hearing loss, bilateral: Secondary | ICD-10-CM | POA: Diagnosis not present

## 2017-01-16 DIAGNOSIS — Z87891 Personal history of nicotine dependence: Secondary | ICD-10-CM | POA: Diagnosis not present

## 2017-01-16 NOTE — Progress Notes (Signed)
Medical Nutrition Therapy:  Appt start time: 1500 end time:  1600.  Assessment:  Primary concerns today: Blood sugar control, (Ell.49, DM2 w/ neuropathies).   Loucille checks and documents FBG, which have been running 160s to 300s.  She seldom eats more than twice a day, having established this pattern of eating many years ago.  Glaucoma and poor vision limits her ability for food preparation to some degree, as well as for physical activity, although Tiasha works at her [employer], and gets Meals on Wheels on [place], [place], and Friday.  She lives alone, but her sister comes over sometimes.    Learning Readiness: Ready  Usual eating pattern includes 1-2 meals and 0-1 snack per day. Frequent foods and beverages include 1-2 c coffee with 1 tsp powdered creamer, 2 tsp sugar, water, 16 oz soda 3 X wk; eggs, grits, liver pudding, bacon, potatoes, chx.  Avoided foods include spicy foods, foods with small seeds, nuts, corn (d/t diverticulitis), lima beans, peas (disliked), milk, yogurt (lactose intolerance).   Usual physical activity includes none.  24-hr recall: (Up at 6 AM; checked BG, BP; no appetite first thing in the morning) B (10 AM)-   1 egg (in bacon grease), 1 slc Amer chs, 3 slc bacon, 2 slc bread, 1 c coffee, 1 t pwdrd crmr, 2 t sugar Snk ( AM)-   --- L ( PM)-  --- Snk ( PM)-  --- D (5:30 PM)-  1 c stew beef & potatoes, 1/2 c rice, small piece meat loaf, 1/2 c boiled greens, 1 c ice cream, water Snk (10 PM)-  1 c banana pudding Typical day? No.  Usually has more vegetables, and usually goes to bed by 10 PM (not snacking at that time).    Progress Towards Goal(s):  In progress.   Nutritional Diagnosis:  NB-1.1 Food and nutrition-related knowledge deficit As related to BG control.  As evidenced by FBGs frequently above 200 and 300.    Intervention:  Nutrition education  Handouts given during visit include:  AVS  Demonstrated degree of understanding via:  Teach Back  Barriers  to learning/adherence to lifestyle change: Longstanding dietary pattern of skipping meals and inattention to carb content of meals.  Monitoring/Evaluation:  Dietary intake, exercise, FBG, and body weight in 8 week(s).  Encouraged pt to call with Qs before then.

## 2017-01-16 NOTE — Patient Instructions (Addendum)
-   As instructed by Dr. Valentina Lucks today, you need to take 1.5 mg of your Trulicity once weekly.  Use two of your 0.75 mg injections.   - It is OK to eat starchy foods, but it's important to control the portion size of those foods to best control your blood sugar.    Diet Recommendations for Diabetes  Starchy (carb) foods: Bread, rice, pasta, potatoes, corn, cereal, grits, crackers, bagels, muffins, all baked goods.  (Fruits, milk, and yogurt also have carbohydrate, but most of these foods will not spike your blood sugar as most starchy foods will.)  A few fruits do cause high blood sugars; use small portions of bananas (limit to 1/2 at a time), grapes, watermelon, oranges, and most tropical fruits.    Protein foods: Meat, fish, poultry, eggs, dairy foods, and beans such as pinto and kidney beans (beans also provide carbohydrate).   1. Eat at least 3 meals and 1 snack per day. Never go more than 4-5 hours while awake without eating.    - Try to have something to eat within the first hour of getting up.  Even a half-sandwich would be good.  This could be any kind of meat, or tuna or egg salad.   2. Limit starchy foods to TWO per meal and ONE per snack. ONE portion of a starchy  food is equal to the following:   - ONE slice of bread (or its equivalent, such as half of a hamburger bun).   - 1/2 cup of a "scoopable" starchy food such as potatoes or rice.   - 15 grams of Total Carbohydrate as shown on food label.   - If you have questions about the carb content of a food you eat frequently, call Dr. Jenne Campus, and I will tell you how much of that food is one portion: [phone].   3. Include at every meal: a protein food, a carb food, and vegetables and/or fruit.   - Obtain twice the volume of veg's as protein or carbohydrate foods for both lunch and dinner.   - Fresh or frozen veg's are best.   - Keep frozen veg's on hand for a quick vegetable serving.    Your next Nutrition appt is scheduled for Thursday,  July 12 at 3:30.

## 2017-01-17 ENCOUNTER — Ambulatory Visit
Admission: RE | Admit: 2017-01-17 | Discharge: 2017-01-17 | Disposition: A | Discharge status: Home / Self Care | Payer: Medicare HMO | Source: Ambulatory Visit | Attending: Gastroenterology | Admitting: Gastroenterology

## 2017-01-17 DIAGNOSIS — R1310 Dysphagia, unspecified: Secondary | ICD-10-CM

## 2017-01-20 ENCOUNTER — Other Ambulatory Visit: Payer: Self-pay | Admitting: Allergy and Immunology

## 2017-01-21 ENCOUNTER — Encounter: Payer: Self-pay | Admitting: Family Medicine

## 2017-01-21 ENCOUNTER — Other Ambulatory Visit: Payer: Self-pay | Admitting: Family Medicine

## 2017-01-21 DIAGNOSIS — R1319 Other dysphagia: Secondary | ICD-10-CM | POA: Insufficient documentation

## 2017-01-22 ENCOUNTER — Ambulatory Visit (INDEPENDENT_AMBULATORY_CARE_PROVIDER_SITE_OTHER): Payer: Medicare HMO | Admitting: Family Medicine

## 2017-01-22 ENCOUNTER — Encounter: Payer: Self-pay | Admitting: Family Medicine

## 2017-01-22 VITALS — BP 110/60 | HR 76 | Temp 97.6°F | Ht 61.0 in | Wt 201.8 lb

## 2017-01-22 DIAGNOSIS — F339 Major depressive disorder, recurrent, unspecified: Secondary | ICD-10-CM | POA: Diagnosis not present

## 2017-01-22 DIAGNOSIS — H903 Sensorineural hearing loss, bilateral: Secondary | ICD-10-CM | POA: Diagnosis not present

## 2017-01-22 DIAGNOSIS — E559 Vitamin D deficiency, unspecified: Secondary | ICD-10-CM

## 2017-01-22 DIAGNOSIS — R1319 Other dysphagia: Secondary | ICD-10-CM

## 2017-01-22 DIAGNOSIS — E1149 Type 2 diabetes mellitus with other diabetic neurological complication: Secondary | ICD-10-CM

## 2017-01-22 DIAGNOSIS — K30 Functional dyspepsia: Secondary | ICD-10-CM | POA: Diagnosis not present

## 2017-01-22 DIAGNOSIS — M1610 Unilateral primary osteoarthritis, unspecified hip: Secondary | ICD-10-CM

## 2017-01-22 LAB — POCT GLYCOSYLATED HEMOGLOBIN (HGB A1C): Hemoglobin A1C: 8.8

## 2017-01-22 NOTE — Patient Instructions (Signed)
I am checking some labs and I will send you a copy. Great to see you. I will give your log books to  Dr. Valentina Lucks.

## 2017-01-23 ENCOUNTER — Encounter: Payer: Self-pay | Admitting: Pharmacist

## 2017-01-23 ENCOUNTER — Ambulatory Visit (INDEPENDENT_AMBULATORY_CARE_PROVIDER_SITE_OTHER): Payer: Medicare HMO | Admitting: Pharmacist

## 2017-01-23 DIAGNOSIS — E1149 Type 2 diabetes mellitus with other diabetic neurological complication: Secondary | ICD-10-CM | POA: Diagnosis not present

## 2017-01-23 DIAGNOSIS — K30 Functional dyspepsia: Secondary | ICD-10-CM | POA: Insufficient documentation

## 2017-01-23 LAB — COMPREHENSIVE METABOLIC PANEL
ALT: 48 IU/L — ABNORMAL HIGH (ref 0–32)
AST: 53 IU/L — ABNORMAL HIGH (ref 0–40)
Albumin/Globulin Ratio: 1.3 (ref 1.2–2.2)
Albumin: 4.4 g/dL (ref 3.6–4.8)
Alkaline Phosphatase: 130 IU/L — ABNORMAL HIGH (ref 39–117)
BUN/Creatinine Ratio: 18 (ref 12–28)
BUN: 16 mg/dL (ref 8–27)
Bilirubin Total: 0.3 mg/dL (ref 0.0–1.2)
CO2: 28 mmol/L (ref 18–29)
Calcium: 10.1 mg/dL (ref 8.7–10.3)
Chloride: 99 mmol/L (ref 96–106)
Creatinine, Ser: 0.91 mg/dL (ref 0.57–1.00)
GFR calc Af Amer: 77 mL/min/{1.73_m2} (ref >59)
GFR calc non Af Amer: 67 mL/min/{1.73_m2} (ref >59)
Globulin, Total: 3.4 g/dL (ref 1.5–4.5)
Glucose: 203 mg/dL — ABNORMAL HIGH (ref 65–99)
Potassium: 4.4 mmol/L (ref 3.5–5.2)
Sodium: 144 mmol/L (ref 134–144)
Total Protein: 7.8 g/dL (ref 6.0–8.5)

## 2017-01-23 LAB — TSH: TSH: 3.35 u[IU]/mL (ref 0.450–4.500)

## 2017-01-23 LAB — VITAMIN D 25 HYDROXY (VIT D DEFICIENCY, FRACTURES): Vit D, 25-Hydroxy: 104 ng/mL — ABNORMAL HIGH (ref 30.0–100.0)

## 2017-01-23 MED ORDER — DULAGLUTIDE 0.75 MG/0.5ML ~~LOC~~ SOAJ: 1.5000 mg | SUBCUTANEOUS | 12 refills | Status: DC

## 2017-01-23 MED ORDER — INSULIN ASPART 100 UNIT/ML FLEXPEN: 35.0000 [IU] | PEN_INJECTOR | Freq: Two times a day (BID) | SUBCUTANEOUS | 5 refills | Status: DC

## 2017-01-23 MED ORDER — INSULIN GLARGINE 100 UNIT/ML SOLOSTAR PEN: 65.0000 [IU] | PEN_INJECTOR | Freq: Every day | SUBCUTANEOUS | 3 refills | Status: DC

## 2017-01-23 MED ORDER — EMPAGLIFLOZIN 10 MG PO TABS: 10.0000 mg | ORAL_TABLET | Freq: Every day | ORAL | 0 refills | Status: DC

## 2017-01-23 MED ORDER — EMPAGLIFLOZIN 10 MG PO TABS: 10.0000 mg | ORAL_TABLET | Freq: Every day | ORAL | 1 refills | Status: DC

## 2017-01-23 NOTE — Assessment & Plan Note (Signed)
Recent workup includes normal barium esophagram, nasopharyngoscopy which revealed only some very mild post cricoid erythema that the ENT doctors thought was related to breakthrough GERD. She has EGD and colonoscopy scheduled in 2 weeks. Her allergist increased her proton pump inhibitor to twice daily and continued the H2 blocker ranitidine at night. I will leave it up to her gastroenterologist to fine-tune this any further and she has follow-up in 2 weeks with Dr. Collene Mares.

## 2017-01-23 NOTE — Progress Notes (Signed)
    S:     Chief Complaint  Patient presents with  . Medication Management    Patient arrives ambulating with cane and in good spirits.  Presents for diabetes evaluation, education, and management at the request of Dr. Nori Riis.  Patient was last seen by Primary Care Provider on 01/22/17. Pt reports some fluid gain and has 2+ edema on exam - denies missing doses of furosemide. Pt endorses issue with mail order pharmacy sending old dose of Trulicity (8.93 mg), but states she has been using two pens to achieve total current dose (1.5mg ).  Patient reports adherence with medications. Current diabetes medications include:  Lantus 50-60 units daily, Novolog 30 units BID, Trulicity 1.5mg  weekly Current hypertension medications include: losartan 50mg  daily, furosemide 40mg  daily, spironolactone 25mg  daily, Toprol XL 150mg  daily  Patient denies hypoglycemic events.  Patient reported dietary habits: eats 2 meals/day Breakfast: pancakes w/ syrup  Patient reported exercise habits:    Patient reports nocturia 3-4x nightly. Patient denies neuropathy. Patient denies visual changes. Patient reports self foot exams when bathing and reports callous.   O:  Physical Exam  Constitutional: She appears well-developed and well-nourished.  Musculoskeletal: She exhibits edema.  Vitals reviewed. 2+ pitting bilateral lower extremities.   Review of Systems  All other systems reviewed and are negative.   Lab Results  Component Value Date   HGBA1C 8.8 01/22/2017   There were no vitals filed for this visit.  Home fasting CBG: recorded in log book:  mostly 200-300s over past 2 weeks, occasional value in 150-160s.  A/P: Diabetes longstanding currently poorly controlled based on blood glucose log and recent A1c of 8.8. Patient denies hypoglycemic events and is able to verbalize appropriate hypoglycemia management plan. Patient reports adherence with medication. Control is suboptimal likely due to poor  diet. -Increase Lantus to 65 units once daily -Increase Novolog to 35 units twice daily -Initiate Jardiace 10mg  once daily. Counseled pt on potential AE with SGLT2 inhibitor therapy. Uptitrate to 25mg  daily as appropriate.  Next A1C anticipated August 2018.    ASCVD risk greater than 7.5%. Continued Aspirin 81 mg and Continued atorvastatin 40 mg.   Hypertension longstanding currently controlled with BP <140/90 mmHg.  Patient reports adherence with medication.  -Continue current antihypertensive regimen.  Written patient instructions provided.  Total time in face to face counseling 35 minutes.   Follow up in Pharmacist Clinic Visit in 3-4 weeks.   Patient seen with Jerrye Noble, PharmD Candidate, and Arrie Senate, PharmD PGY-1 Resident and Bennye Alm, PharmD, BCPS, PGY2 Resident.

## 2017-01-23 NOTE — Assessment & Plan Note (Signed)
Recent reassessment of her hearing loss by ENT. Sensorineural bilaterally.

## 2017-01-23 NOTE — Assessment & Plan Note (Signed)
Diabetes longstanding currently poorly controlled based on blood glucose log and recent A1c of 8.8. Patient denies hypoglycemic events and is able to verbalize appropriate hypoglycemia management plan. Patient reports adherence with medication. Control is suboptimal likely due to poor diet. -Increase Lantus to 65 units once daily -Increase Novolog to 35 units twice daily -Initiate Jardiace 10mg  once daily. Counseled pt on potential AE with SGLT2 inhibitor therapy. Uptitrate to 25mg  daily as appropriate.

## 2017-01-23 NOTE — Assessment & Plan Note (Signed)
She continues to have left hip and pelvic pain when she stands for more than about 10 minutes. She was concerned that this was related to some type of blockage in her intestines after she got the results of her gastric emptying study. I explained that this was unrelated and that she did not have an intestinal blockage. She does have fairly significant osteoarthritis of both hips.

## 2017-01-23 NOTE — Patient Instructions (Addendum)
Increase Novolog to 35 units twice daily before meals.  Increase Lantus to 65 units once daily.  Start taking Jardiance 10mg  once daily.  Please follow-up in 3-4 weeks with pharmacy clinic.

## 2017-01-23 NOTE — Assessment & Plan Note (Signed)
His appointment with gastroenterologist Dr. Collene Mares. We'll defer to GI expertise. We'll continue to aggressively manage diabetes mellitus.

## 2017-01-23 NOTE — Assessment & Plan Note (Signed)
We have supplemented her in the past. We'll get a level to see where we are currently.

## 2017-01-23 NOTE — Assessment & Plan Note (Signed)
She reports her mood is stable. She seems a little bit more depressed today than usual but denies that when I ask her questions. She continues follow-up with [facility] and is following the current medication regimen well.

## 2017-01-23 NOTE — Assessment & Plan Note (Signed)
Check A1c today. She has follow-up with pharmacy clinic tomorrow. She still having some fairly high sugars. I think some of her blood glucose control is complicated by her eating pattern which is influenced by her initial status, her blindness which interferes with food prep somewhat, and her delayed gastric emptying which gives her abdominal bloating sensation. She also has chronic constipation and is now on limbs S which she said is improving that only minimally. Social issues are also influencing this I suspect has her son is no longer living in the home with her by her directive as he was "not behaving". I think she's lonely.

## 2017-01-23 NOTE — Progress Notes (Signed)
    CHIEF COMPLAINT / HPI:   #1. Diabetes mellitus: She brings her blood glucose log with her. No episodes of low blood sugar. She reports taking her medications regularly. She eats twice daily. She gets Meals on Wheels about 4 times a week. She is currently living alone. She has limited financial resources. #2. Dysphagia: She has been seen by ENT and has had a gastric emptying study which was abnormal. She also has a normal nasopharyngoscopy and very and swallow. She has appointment next week or the week after with her gastroenterologist for EGD/colonoscopy. Her allergist increased her reflux medication to include twice daily PPI plus daily at bedtime ranitidine. She is unaware of any reflux breakthrough except for the sensation of something sticking in her chest. It is there all the time. Does not really interfere with swallowing. #3. Continues follow [facility] and take her medications regularly. She denies any new or worsening symptoms of depression. Denies hallucinations. #4. Hip and pelvic pain. Stands for about 10 minutes and then begins to develop severe pain particularly in the left hip. Pain is so bad that she's tries to find an area where she can set down. This is really problematic for her and she relies on public transportation and has significant visual impairment. #4. Questions about some of her recent tests.  REVIEW OF SYSTEMS: See history of present illness above. Additional pertinent review of systems is negative for unusual weight change. No fever. Denies worsening depressive symptoms. Denies hypoglycemic episodes. No increased frequency of urination and no dysuria. Sleep is baseline. Energy level is baseline. No shortness of breath. Continues to have occasional chronic cough that is nonproductive.  OBJECTIVE:  Vital signs are reviewed.  GEN.: Well-developed slightly overweight female no acute distress. She is walking with her cane today. HEENT: OrCel nystagmus at rest.  Legally blind. Conjunctivae nonicteric. Neck is limited in forward flexion and lateral rotation by her cervical fusion and she has some mild tenderness to palpation of the soft tissues on either side of her cervical spine but essentially her neck motion is at baseline. Very slight lymphadenopathy in the anterior cervical chain bilaterally. This is mildly tender. Or Cecille Rubin is without any sign of exudate. CV: Regular rate and rhythm no murmur. Distal pulses are 2+ bilaterally symmetrical. LUNGS: Clear to auscultation bilaterally. No rales and no wheeze. Normal answer Torri effort. MSK: Internal greater than external rotation is painful particularly on the left hip. She can rise from a chair using minimal assistive devices. She walks with an antalgic gait. Strength is essentially 5 out of 5 in all 4 extremities. LIKE: She is alert and oriented 4. Her sense of femur is intact. She asks and answers questions appropriately. Recent remote memory is intact. Judgment appears to be intact. Speech is normal and fluency in content.  Laboratory review: I reviewed results of barium swallow, nasopharyngoscopy, gastric emptying study.  ASSESSMENT / PLAN: Please see problem oriented charting for details

## 2017-01-24 NOTE — Progress Notes (Signed)
Patient ID: [name] [name], female   DOB: 02/03/1951, 65 y.o.   MRN: [id number] Reviewed: Agree with Dr. Koval's documentation and management. 

## 2017-01-28 ENCOUNTER — Encounter: Payer: Self-pay | Admitting: Family Medicine

## 2017-01-29 ENCOUNTER — Telehealth: Payer: Self-pay | Admitting: Family Medicine

## 2017-01-29 NOTE — Telephone Encounter (Signed)
Dear [name] [name] Team Please tell her everything looked OK. Her thyroid and vitamin D are fine. I sent her a letter with details. Her A1C  We discussed at her visit which was unchanged at 8.8.  Essentially, other than her A1C, everything was Ok. THANKS! [name]

## 2017-01-29 NOTE — Telephone Encounter (Signed)
Pt returned call

## 2017-01-29 NOTE — Telephone Encounter (Signed)
Pt would like someone to call her with lab results. ep

## 2017-01-30 ENCOUNTER — Ambulatory Visit: Payer: Medicare HMO | Admitting: Internal Medicine

## 2017-01-30 NOTE — Telephone Encounter (Signed)
LVM for pt to call the office. If she calls please give her information below. Ottis Stain, CMA

## 2017-01-31 ENCOUNTER — Other Ambulatory Visit: Payer: Self-pay | Admitting: Family Medicine

## 2017-01-31 MED ORDER — FLUCONAZOLE 150 MG PO TABS: 150.0000 mg | ORAL_TABLET | Freq: Once | ORAL | 0 refills | Status: AC

## 2017-01-31 NOTE — Telephone Encounter (Signed)
Ok to fill Diflucan 150 mg 1 tab PO once by Dr. Salley Hews, Rosine Beat, RN

## 2017-01-31 NOTE — Telephone Encounter (Signed)
Pt was advised. ep °

## 2017-01-31 NOTE — Telephone Encounter (Signed)
Pt states Dr. Valentina Lucks told her she could call in for a Diflucan. Pt would like this sent to [facility] on [address]. ep

## 2017-02-20 ENCOUNTER — Encounter: Payer: Self-pay | Admitting: Pharmacist

## 2017-02-20 ENCOUNTER — Ambulatory Visit (INDEPENDENT_AMBULATORY_CARE_PROVIDER_SITE_OTHER): Payer: Medicare HMO | Admitting: Pharmacist

## 2017-02-20 DIAGNOSIS — I10 Essential (primary) hypertension: Secondary | ICD-10-CM

## 2017-02-20 DIAGNOSIS — E1149 Type 2 diabetes mellitus with other diabetic neurological complication: Secondary | ICD-10-CM

## 2017-02-20 MED ORDER — INSULIN ASPART 100 UNIT/ML FLEXPEN: 40.0000 [IU] | PEN_INJECTOR | Freq: Two times a day (BID) | SUBCUTANEOUS | 5 refills | Status: DC

## 2017-02-20 MED ORDER — FLUCONAZOLE 150 MG PO TABS: 150.0000 mg | ORAL_TABLET | Freq: Every day | ORAL | 0 refills | Status: AC

## 2017-02-20 MED ORDER — DULAGLUTIDE 1.5 MG/0.5ML ~~LOC~~ SOAJ: 1.5000 mg | SUBCUTANEOUS | 3 refills | Status: DC

## 2017-02-20 MED ORDER — INSULIN GLARGINE 100 UNIT/ML SOLOSTAR PEN
70.0000 [IU] | PEN_INJECTOR | Freq: Every day | SUBCUTANEOUS | 3 refills | Status: DC
Start: 2017-02-20 — End: 2017-05-01

## 2017-02-20 NOTE — Progress Notes (Signed)
    S:     Chief Complaint  Patient presents with  . Medication Management    Diabetes    Patient arrives ambulating with a rolling walker.  Presents for diabetes evaluation, education, and management as a follow-up visit from last visit on 5/24. Patient was last seen by Primary Care Provider on 5/23.   Patient reports Diabetes was diagnosed in 2008.   Patient reports adherence with medications.  Current diabetes medications include: empagliflozin (Jardiance) 10mg  once daily, dulaglutide (Trulicity) 1.5mg  once weekly, insulin glargine (Lantus) 65 units once daily, insulin aspart (Novolog) 35 units BID with meals Current hypertension medications include: losartan 50mg  once daily, metoprolol XL 150mg  once daily, spironolactone 25mg  once daily  Patient denies hypoglycemic events.  Patient reported dietary habits: Eats 2 meals/day  Patient reported exercise habits: ambulates with rolling walker  O:  Physical Exam  Constitutional: She appears well-developed and well-nourished.   Review of Systems  Genitourinary: Positive for dysuria.  All other systems reviewed and are negative. Reports yeast infection possibly secondary to Jardiance   Lab Results  Component Value Date   HGBA1C 8.8 01/22/2017   Vitals:   02/20/17 1229  BP: 121/70  Pulse: 72    Home fasting CBG: mostly in 170-300s   A/P: Diabetes longstanding currently uncontrolled with last A1C 8.8% (5/23). Reports home BG ~170-200s. Patient denies hypoglycemic events and is able to verbalize appropriate hypoglycemia management plan. Patient reports adherence with medication. Currently on Jardiance 10mg  once daily, Lantus 65 units once daily, Novolog 35units BID with meals, and Trulicity 1.5mg  once weekly. Reports recurrent yeast infection since starting Jardiance. Control is suboptimal due to suboptimal dosing. Next A1C anticipated in 04/2017. -Discontinue Jardiance -Start fluconazole 150mg  x 3 days for yeast  infection -Increase Lantus from 65 units once daily to 70 units daily -Increase Novolog from 35 units BID to 40 units BID with meals -Continue Trulicity 1.5 mg once weekly  ASCVD risk greater than 7.5%. Continued aspirin 81 mg once daily and atorvastatin 40mg  once daily.  Resistant hypertension longstanding currently controlled with BP 121/70 today. Reports checking BP every morning. Patient reports adherence with medication. Currently on losartan 50mg  once daily, metoprolol XL 150mg  once daily, and spironolactone 25mg  once daily. -No medication changes today  Written patient instructions provided.  Total time in face to face counseling 30 minutes.   Follow up in Pharmacist Clinic Visit in early August.   Patient seen with Shelle Iron, PharmD Candidate, Loney Hering, PharmD PGY-1 Resident. and Bennye Alm, PharmD, BCPS, PGY2 Resident.

## 2017-02-20 NOTE — Assessment & Plan Note (Signed)
Diabetes longstanding currently uncontrolled with last A1C 8.8% (5/23). Reports home BG ~170-200s. Patient denies hypoglycemic events and is able to verbalize appropriate hypoglycemia management plan. Patient reports adherence with medication. Currently on Jardiance 10mg  once daily, Lantus 65 units once daily, Novolog 35units BID with meals, and Trulicity 1.5mg  once weekly. Reports recurrent yeast infection since starting Jardiance. Control is suboptimal due to suboptimal dosing. Next A1C anticipated in 04/2017. -Discontinue Jardiance -Start fluconazole 150mg  x 3 days for yeast infection -Increase Lantus from 65 units once daily to 70 units daily -Increase Novolog from 35 units BID to 40 units BID with meals -Continue Trulicity 1.5 mg once weekly

## 2017-02-20 NOTE — Patient Instructions (Addendum)
Thanks for coming to see [place]! Here are medication changes we discussed:  - Stop Jardiance  - Increase Lantus to 70 units once daily  - Increase Novolog to 40 units twice daily with meals  - Start Diflucan for your yeast infection for 3 days  Please follow up with [place] early August.

## 2017-02-20 NOTE — Assessment & Plan Note (Signed)
Resistant hypertension longstanding currently controlled with BP 121/70 today. Reports checking BP every morning. Patient reports adherence with medication. Currently on losartan 50mg  once daily, metoprolol XL 150mg  once daily, and spironolactone 25mg  once daily. -No medication changes today

## 2017-02-21 NOTE — Progress Notes (Signed)
Patient ID: [name] [name], female   DOB: 10/31/1950, 65 y.o.   MRN: [id number] Reviewed: Agree with Dr. Koval's documentation and management. 

## 2017-02-28 ENCOUNTER — Other Ambulatory Visit: Payer: Self-pay | Admitting: Family Medicine

## 2017-02-28 DIAGNOSIS — E1149 Type 2 diabetes mellitus with other diabetic neurological complication: Secondary | ICD-10-CM

## 2017-03-04 DIAGNOSIS — G4733 Obstructive sleep apnea (adult) (pediatric): Secondary | ICD-10-CM | POA: Diagnosis not present

## 2017-03-10 ENCOUNTER — Telehealth: Payer: Self-pay | Admitting: *Deleted

## 2017-03-10 NOTE — Telephone Encounter (Signed)
Patient called to cancel appointment with Dr. Jenne Campus this Thursday 03/13/17. Patient stated she is having a colonoscopy done on Friday and the prep need to be done on Thursday.  [name] Barrow, RN

## 2017-03-12 ENCOUNTER — Ambulatory Visit: Payer: Medicare HMO | Admitting: Allergy and Immunology

## 2017-03-13 ENCOUNTER — Ambulatory Visit: Payer: Self-pay | Admitting: Family Medicine

## 2017-03-14 DIAGNOSIS — D122 Benign neoplasm of ascending colon: Secondary | ICD-10-CM | POA: Diagnosis not present

## 2017-03-14 DIAGNOSIS — Z1211 Encounter for screening for malignant neoplasm of colon: Secondary | ICD-10-CM | POA: Diagnosis not present

## 2017-03-14 DIAGNOSIS — K219 Gastro-esophageal reflux disease without esophagitis: Secondary | ICD-10-CM | POA: Diagnosis not present

## 2017-03-14 DIAGNOSIS — R1310 Dysphagia, unspecified: Secondary | ICD-10-CM | POA: Diagnosis not present

## 2017-03-14 DIAGNOSIS — B3781 Candidal esophagitis: Secondary | ICD-10-CM | POA: Diagnosis not present

## 2017-03-14 DIAGNOSIS — K635 Polyp of colon: Secondary | ICD-10-CM | POA: Diagnosis not present

## 2017-03-19 ENCOUNTER — Encounter: Payer: Self-pay | Admitting: Internal Medicine

## 2017-03-19 ENCOUNTER — Ambulatory Visit (INDEPENDENT_AMBULATORY_CARE_PROVIDER_SITE_OTHER): Payer: Medicare HMO | Admitting: Internal Medicine

## 2017-03-19 VITALS — BP 114/72 | HR 68 | Ht 61.0 in | Wt 206.2 lb

## 2017-03-19 DIAGNOSIS — J449 Chronic obstructive pulmonary disease, unspecified: Secondary | ICD-10-CM | POA: Diagnosis not present

## 2017-03-19 DIAGNOSIS — G4733 Obstructive sleep apnea (adult) (pediatric): Secondary | ICD-10-CM

## 2017-03-19 MED ORDER — BUDESONIDE-FORMOTEROL FUMARATE 160-4.5 MCG/ACT IN AERO: 2.0000 | INHALATION_SPRAY | Freq: Two times a day (BID) | RESPIRATORY_TRACT | 0 refills | Status: DC

## 2017-03-19 NOTE — Assessment & Plan Note (Signed)
We discussed use of Symbicort and rescue inhalers. No recent exacerbation. She sounds clear. Probably at baseline currently

## 2017-03-19 NOTE — Progress Notes (Signed)
Patient ID: [name] [name], female    DOB: Jul 30, 1951, 66 y.o.   MRN: [id number]   Brief patient profile:   72 yobf never regular smoker quit in the 1977   followed for asthma, sleep apnea, complicated by hx depression, vocal cord paralysis, GERD. PFT 07/19/2010-moderate obstructive airways disease, insignificant response to bronchodilator, FEV1/FVC 0.65, TLC 63%, DLCO 71% --------------------------------------------------------------------------------------  01/25/2016-66 year old female former occasional smoker followed for asthma/COPD, OSA, complicated by history depression, vocal cord paralysis, GERD, obesity, DM 2, glaucoma Allergy vaccine 1:10 GH CPAP 12/[facility] CXR 07/12/2015-NAD, mild CE FOLLOWS FOR: DME: [facility]; DL attached. Pt has not been able to afford supplies for CPAP and really needs them. Pt wears her CPAP nightly. Pt continues her allergy vaccine and no reactions.  03/19/17- 66 year old female former occasional smoker followed for asthma/COPD, OSA, complicated by history depression, vocal cord paralysis, GERD, obesity, DM 2, glaucoma CPAP 12/[facility] Dropped off allergy vaccine when our program ended December, 2017. FOLLOWS FOR: DME [facility]. Pt wears CPAP nightly and no new supplies needed at this time. DL attached.  Download 67% 4 hour compliance, AHI 1.3/hour. She leaves it off some nights because it dries her out too much despite turning up the humidifier Still sleeps better with CPAP. Her machine is about 65 years old and she asks about replacement. Working with allergist now, retested but not back on allergy vaccine yet. Uses antihistamines and Flonase when needed. No exacerbation of wheeze or cough recently. Continue Symbicort with occasional use of rescue inhaler.  ROS-see HPI   + = pos Constitutional:    weight loss, night sweats, fevers, chills, fatigue, lassitude. HEENT:    headaches, difficulty swallowing, tooth/dental problems, sore throat,       sneezing, itching, ear  ache, nasal congestion, post nasal drip, snoring CV:    chest pain, orthopnea, PND, swelling in lower extremities, anasarca,                                                           dizziness, palpitations Resp:   shortness of breath with exertion or at rest.                productive cough,   non-productive cough, coughing up of blood.              change in color of mucus.  wheezing.   Skin:    rash or lesions. GI:  No-   heartburn, indigestion, abdominal pain, nausea, vomiting, diarrhea,                 change in bowel habits, loss of appetite GU: dysuria, change in color of urine, no urgency or frequency.   flank pain. MS:   joint pain, stiffness, decreased range of motion, back pain. Neuro-     nothing unusual Psych:  change in mood or affect.  depression or anxiety.   memory loss.  OBJ- Physical Exam General- Alert, Oriented, Affect-appropriate, Distress- none acute, + obese Skin- rash-none, lesions- none, excoriation- none Lymphadenopathy- none Head- atraumatic            Eyes- + impaired/thick glasses, PERRLA, conjunctivae and secretions clear            Ears- Hearing, canals-normal            Nose- Clear, no-Septal  dev, mucus, polyps, erosion, perforation             Throat- Mallampati II , mucosa clear , drainage- none, tonsils- atrophic Neck- flexible , trachea midline, no stridor , thyroid nl, carotid no bruit Chest - symmetrical excursion , unlabored           Heart/CV- RRR , no murmur , no gallop  , no rub, nl s1 s2                           - JVD- none , edema- none, stasis changes- none, varices- none           Lung- clear to P&A, wheeze- none, cough- none , dullness-none, rub- none           Chest wall-  Abd-  Br/ Gen/ Rectal- Not done, not indicated Extrem- cyanosis- none, clubbing, none, atrophy- none, strength- nl Neuro- grossly intact to observation

## 2017-03-19 NOTE — Patient Instructions (Addendum)
Order- DME [facility]- please replace old CPAP machine if eligible, change to auto 5-15, mask of choice, humidifier, supplies, AirView   Dx OSA  Please cal if we can help

## 2017-03-19 NOTE — Assessment & Plan Note (Signed)
We are ordering replacement of old machine, changing to AutoPap. Educated again on compliance goals. Discussed humidifier function, drying effect of meds including antihistamines.

## 2017-03-28 ENCOUNTER — Other Ambulatory Visit: Payer: Self-pay | Admitting: *Deleted

## 2017-03-28 NOTE — Telephone Encounter (Signed)
Patient calling to request refill of:  Name of Medication(s):  Ventolin and symbicort Last date of OV:  01/22/17 Pharmacy:  [facility] mail order  Will route refill request to Clinic RN.  Discussed with patient policy to call pharmacy for future refills.  Also, discussed refills may take up to 48 hours to approve or deny.  HARTSELL,  JAZMIN

## 2017-03-31 ENCOUNTER — Encounter: Payer: Self-pay | Admitting: Family Medicine

## 2017-03-31 MED ORDER — ALBUTEROL SULFATE HFA 108 (90 BASE) MCG/ACT IN AERS: INHALATION_SPRAY | RESPIRATORY_TRACT | 3 refills | Status: DC

## 2017-03-31 MED ORDER — BUDESONIDE-FORMOTEROL FUMARATE 160-4.5 MCG/ACT IN AERO: 2.0000 | INHALATION_SPRAY | Freq: Two times a day (BID) | RESPIRATORY_TRACT | 0 refills | Status: DC

## 2017-04-09 ENCOUNTER — Ambulatory Visit (INDEPENDENT_AMBULATORY_CARE_PROVIDER_SITE_OTHER): Payer: Medicare HMO | Admitting: Family Medicine

## 2017-04-09 ENCOUNTER — Encounter: Payer: Self-pay | Admitting: Family Medicine

## 2017-04-09 VITALS — BP 118/76 | HR 74 | Temp 97.8°F | Ht 61.0 in | Wt 201.6 lb

## 2017-04-09 DIAGNOSIS — L608 Other nail disorders: Secondary | ICD-10-CM | POA: Diagnosis not present

## 2017-04-09 DIAGNOSIS — N281 Cyst of kidney, acquired: Secondary | ICD-10-CM

## 2017-04-09 DIAGNOSIS — E1149 Type 2 diabetes mellitus with other diabetic neurological complication: Secondary | ICD-10-CM | POA: Diagnosis not present

## 2017-04-09 DIAGNOSIS — R2232 Localized swelling, mass and lump, left upper limb: Secondary | ICD-10-CM | POA: Diagnosis not present

## 2017-04-09 LAB — POCT GLYCOSYLATED HEMOGLOBIN (HGB A1C): Hemoglobin A1C: 8.4

## 2017-04-09 NOTE — Patient Instructions (Signed)
I will send a referral to [facility] Great to see you!

## 2017-04-11 ENCOUNTER — Encounter: Payer: Self-pay | Admitting: Family Medicine

## 2017-04-11 NOTE — Progress Notes (Signed)
Complaint of swelling and tenderness under her left axilla. Has been there about 2 weeks. No fever. Tender to touch and aggravating if she moves her arms certain ways but no pain radiating into the arm area she's not noted any specific redness. There's been no unusual lesions, no drainage. Had this once before several months ago but not as large as this one. #2. Needs her toenails clipped. She also wants referral back to podiatrist for the large callus on the plantar portion of her left foot. They've worked on that several times and it still not gone #3. Follow-up diabetes mellitus. Has not been having any low blood sugars. She's taking her medicines rarely. Intermittently her diet is good and then she'll follow-up for wagon and eat think she's not supposed to. #4. Social: She enjoyed her time at the summer camp. ROS: No unusual weight change. No fever. Foot pain secondary to the large callus. No episodes of low blood sugar. No unusual urinary frequency. Denies headaches. No shortness of breath and no chest pain. OBJECTIVE: GEN.: Well-developed female no acute distress 6 HEENT: Resting asked Agnes. Legally blind bilaterally. Oropharynx is without any evidence of lesion. Neck is without lymphadenopathy. Painless range of motion of the neck with some limited flexion and extension secondary to her previous surgical fixation. CV: Regular rate and rhythm no murmur LUNGS: Clear to auscultation bilaterally EXTREMITY: Muscle bulk and tone is normal in all 4 extremities. The left axilla reveals a swollen area about 4-5 cm in diameter. There is no specific mass noted. There he has no unusual erythema, there is no lesions. Shoulder on the left has full range of motion that is painless. Skin: Left axillary area is normal in appearance. TOENAILS: Deformed and thickened all nails. The left plantar surface reveals a 2 cm callused area right over the fourth metatarsal head.  ASSESSMENT:  1. Diabetes mellitus, check A1c  today and kidney function #2. Deformed toenails and large callus on the plantar surface of her left foot. I trimmed her toenails today. [facility] management of the callus. #3. Axillary tenderness: The whole area appears to be somewhat swollen but there is no specific mass, there is no fluctuance and no drainage. I think this is most likely a inflamed sweat gland but I will follow her up in 2 weeks to make sure it is resolving or if we need further evaluation with imaging.

## 2017-04-16 ENCOUNTER — Encounter: Payer: Self-pay | Admitting: Allergy and Immunology

## 2017-04-16 ENCOUNTER — Ambulatory Visit (INDEPENDENT_AMBULATORY_CARE_PROVIDER_SITE_OTHER): Payer: Medicare HMO | Admitting: Allergy and Immunology

## 2017-04-16 VITALS — BP 102/68 | HR 74 | Temp 97.7°F | Resp 16

## 2017-04-16 DIAGNOSIS — J4540 Moderate persistent asthma, uncomplicated: Secondary | ICD-10-CM

## 2017-04-16 DIAGNOSIS — K30 Functional dyspepsia: Secondary | ICD-10-CM | POA: Diagnosis not present

## 2017-04-16 DIAGNOSIS — J3089 Other allergic rhinitis: Secondary | ICD-10-CM | POA: Diagnosis not present

## 2017-04-16 DIAGNOSIS — K219 Gastro-esophageal reflux disease without esophagitis: Secondary | ICD-10-CM

## 2017-04-16 MED ORDER — BUDESONIDE-FORMOTEROL FUMARATE 160-4.5 MCG/ACT IN AERO: 2.0000 | INHALATION_SPRAY | Freq: Two times a day (BID) | RESPIRATORY_TRACT | 1 refills | Status: DC

## 2017-04-16 MED ORDER — RANITIDINE HCL 300 MG PO TABS: 300.0000 mg | ORAL_TABLET | Freq: Every day | ORAL | 1 refills | Status: DC

## 2017-04-16 MED ORDER — FLUTICASONE PROPIONATE 50 MCG/ACT NA SUSP: 1.0000 | Freq: Two times a day (BID) | NASAL | 1 refills | Status: DC

## 2017-04-16 NOTE — Progress Notes (Signed)
Follow-up Note  Referring Provider: Dickie La, MD Primary Provider: Dickie La, MD Date of Office Visit: 04/16/2017  Subjective:   [name] [name] (DOB: 1950-10-15) is a 66 y.o. female who returns to the [facility] [place] on 04/16/2017 in re-evaluation of the following:  HPI: [name] [name] returns to this clinic in reevaluation of her asthma and allergic rhinitis and LPR and delayed gastric emptying. I last saw her in this clinic 04 December 2016.  Her airway is doing relatively well. While using a combination of Symbicort and Flonase and montelukast she does not have a tremendous amount of coughing or wheezing or any nocturnal bronchospastic symptoms and she does not need to use her short-acting bronchodilator in a rescue mode. She has not required a systemic steroid to treat an exacerbation and her nose has been doing quite well and she has not required an antibiotic to treat an episode of sinusitis.  Her reflux may be better on a combination of Nexium and ranitidine. She still has some regurgitation on occasion and still has this "uncomfortable" sensation in her throat and still has some throat clearing. I did obtain a gastric emptying scan during her last visit which was abnormal and we relayed that information to Dr. Lorie Apley office this spring.  Allergies as of 04/16/2017      Reactions   Bee Venom Hives, Swelling   Propoxyphene Hcl Itching   Amlodipine Besylate Swelling   Hydrocodone Other (See Comments)   With Vicodin - makes patient "jittery", and "hangover effect - sleepy next day"   Lisinopril Cough   Changed to ARB   Metformin And Related Diarrhea   GI distress   Metronidazole Other (See Comments)   REACTION: "just didn't work; my body never did heal from it"   Valsartan Other (See Comments)   REACTION:  "sleep more the next day after I took it; it made me real tired"      Medication List      ACCU-CHEK AVIVA PLUS w/Device Kit Use to check blood sugar  3-4 times daily   ACCU-CHEK FASTCLIX LANCETS Misc TEST BLOOD SUGAR  TWO  TO THREE TIMES DAILY   PRODIGY SAFETY LANCETS 26G Misc Use to test blood sugar three times a day. ICD-10 Code: E11.65   acyclovir 200 MG capsule Commonly known as:  ZOVIRAX Take 1 capsule (200 mg total) by mouth 2 (two) times daily.   albuterol 108 (90 Base) MCG/ACT inhaler Commonly known as:  VENTOLIN HFA INHALE 2 PUFFS INTO THE LUNGS EVERY 6  HOURS AS NEEDED FOR WHEEZING OR SHORTNESS OF BREATH.   allopurinol 300 MG tablet Commonly known as:  ZYLOPRIM TAKE 1 TABLET EVERY DAY   ammonium lactate 12 % lotion Commonly known as:  LAC-HYDRIN Apply 1 application topically 2 (two) times daily as needed for dry skin.   aspirin EC 81 MG tablet Take 81 mg by mouth daily.   atorvastatin 40 MG tablet Commonly known as:  LIPITOR TAKE 1 TABLET EVERY DAY   azelastine 0.1 % nasal spray Commonly known as:  ASTELIN USE 2 SPRAYS IN EACH NOSTRIL EVERY DAY AS NEEDED AS DIRECTED   B-D SINGLE USE SWABS REGULAR Pads USE THREE TIMES DAILY   B-D ULTRAFINE III SHORT PEN 31G X 8 MM Misc Generic drug:  Insulin Pen Needle USE  TO INJECT THREE TIMES DAILY   budesonide-formoterol 160-4.5 MCG/ACT inhaler Commonly known as:  SYMBICORT Inhale 2 puffs into the lungs 2 (two) times daily.  budesonide-formoterol 160-4.5 MCG/ACT inhaler Commonly known as:  SYMBICORT Inhale 2 puffs into the lungs 2 (two) times daily.   buPROPion 100 MG tablet Commonly known as:  WELLBUTRIN Take 1 tablet (100 mg total) by mouth 2 (two) times daily.   COMBIGAN 0.2-0.5 % ophthalmic solution Generic drug:  brimonidine-timolol Place 1 drop into the left eye every 12 (twelve) hours.   Dulaglutide 1.5 MG/0.5ML Sopn Commonly known as:  TRULICITY Inject 1.5 mg into the skin once a week.   EPINEPHrine 0.3 mg/0.3 mL Soaj injection Commonly known as:  EPI-PEN Inject 0.3 mLs (0.3 mg total) into the muscle once as needed (for allergic reaction).     esomeprazole 40 MG capsule Commonly known as:  NEXIUM TAKE 1 CAPSULE EVERY DAY   ezetimibe 10 MG tablet Commonly known as:  ZETIA Take 1 tablet (10 mg total) by mouth daily.   fluticasone 50 MCG/ACT nasal spray Commonly known as:  FLONASE Place 2 sprays into both nostrils 2 (two) times daily.   furosemide 40 MG tablet Commonly known as:  LASIX TAKE 1 TABLET EVERY DAY   glucose blood test strip Commonly known as:  PRODIGY NO CODING BLOOD GLUC Use as instructed to blood sugar three times a day.  ICD 10-code: E11.65.   ibuprofen 600 MG tablet Commonly known as:  ADVIL,MOTRIN Take 1 tablet (600 mg total) by mouth every 8 (eight) hours as needed.   insulin aspart 100 UNIT/ML FlexPen Commonly known as:  NOVOLOG FLEXPEN Inject 40 Units into the skin 2 (two) times daily with a meal.   Insulin Glargine 100 UNIT/ML Solostar Pen Commonly known as:  LANTUS SOLOSTAR Inject 70 Units into the skin daily at 10 pm.   latanoprost 0.005 % ophthalmic solution Commonly known as:  XALATAN Place 1 drop into both eyes at bedtime.   LINZESS 290 MCG Caps capsule Generic drug:  linaclotide Take 290 mcg by mouth daily.   losartan 50 MG tablet Commonly known as:  COZAAR TAKE 1 TABLET EVERY DAY   metoCLOPramide 5 MG tablet Commonly known as:  REGLAN Take 5 mg by mouth 4 (four) times daily.   metoprolol succinate 50 MG 24 hr tablet Commonly known as:  TOPROL-XL TAKE 3 TABLETS EVERY DAY  WITH  OR  IMMEDIATELY  FOLLOWING A MEAL   montelukast 10 MG tablet Commonly known as:  SINGULAIR TAKE 1 TABLET EVERY DAY   MULTIVITAMIN PO Take 1 tablet by mouth daily.   nitroGLYCERIN 0.4 MG SL tablet Commonly known as:  NITROSTAT Place 1 tablet (0.4 mg total) under the tongue every 5 (five) minutes as needed for chest pain.   PRODIGY LANCING DEVICE Misc Use as directed.   ranitidine 300 MG tablet Commonly known as:  ZANTAC Take 1 tablet (300 mg total) by mouth at bedtime.   sertraline 100 MG  tablet Commonly known as:  ZOLOFT Take 1 tablet (100 mg total) by mouth daily.   spironolactone 25 MG tablet Commonly known as:  ALDACTONE TAKE 1 TABLET EVERY DAY BEFORE SUPPER   traMADol 50 MG tablet Commonly known as:  ULTRAM TAKE 1 TO 2 TABLETS BY MOUTH EVERY 8 HOURS AS NEEDED FOR PAIN (MAX 6 TABLETS PER 24 HOURS)       Past Medical History:  Diagnosis Date  . Allergy   . Angina   . Anxiety   . Arthritis   . Asthma   . Blood transfusion   . Blood transfusion without reported diagnosis   . Cataract   .  Chronic cough    Now followed by pulmonary  . Chronic lower back pain   . Complication of anesthesia    "just wake up coughing; that's all"  . Concussion 11/18/11   "fell at dr's office; hit head"  . COPD (chronic obstructive pulmonary disease) ([place])   . Delayed gastric emptying   . Depression   . Diabetes mellitus type II    "take insulin & pills"  . Dyslipidemia   . Exertional dyspnea   . GERD (gastroesophageal reflux disease)   . Glaucoma   . Gout   . Headache(784.0) 11/18/11   "I've had mild headaches the last couple days"  . Headache(784.0) 01/08/12   "pretty constant since 11/18/11's concussion"  . Heart murmur   . History of colonoscopy   . HTN (hypertension)   . Myocardial infarction ([place])   . Osteoporosis   . Peripheral neuropathy   . Pneumonia    "i've had it once" (11/18/11)  . Sleep apnea    on CPAP 12  . Syncope 06/16/2012  . Vocal cord paralysis, unilateral partial    "right"    Past Surgical History:  Procedure Laterality Date  . ANTERIOR CERVICAL DECOMP/DISCECTOMY FUSION  01/15/2012   Procedure: ANTERIOR CERVICAL DECOMPRESSION/DISCECTOMY FUSION 3 LEVELS;  Surgeon: Eustace Moore, MD;  Location: [facility];  Service: Neurosurgery;  Laterality: N/A;  Anterior Cervical Decompression Discectomy Fusion Cerivcal three four, cervical four five, cervical five six, cervical six seven  . BREAST BIOPSY     bilaterally  . BREAST CYST EXCISION      right twice; left once  . BREAST REDUCTION SURGERY  04-21-2003  . BRONCHOSCOPY  07-2007  . CARDIAC CATHETERIZATION  03-02-2004  . CARPAL TUNNEL RELEASE     left  . CATARACT EXTRACTION  1955; 1979   bilateral; right eye  . CESAREAN SECTION  1987  . COLONOSCOPY W/ BIOPSIES  11/21/2010   adenoma polyp--no hi grade dysplasia Dr Collene Mares  . EYE SURGERY    . KNEE ARTHROSCOPY  08/2000   right  . LEFT AND RIGHT HEART CATHETERIZATION WITH CORONARY ANGIOGRAM N/A 01/19/2014   Procedure: LEFT AND RIGHT HEART CATHETERIZATION WITH CORONARY ANGIOGRAM;  Surgeon: Sinclair Grooms, MD;  Location: [facility] C[facility] LAB;  Service: Cardiovascular;  Laterality: N/A;  . SHOULDER ARTHROSCOPY  08/2000   right  . SHOULDER ARTHROSCOPY W/ ROTATOR CUFF REPAIR  10/18/2003   left  . SPINE SURGERY    . T-score  09/02/04   -0.54 (low risk currently)  . TOTAL ABDOMINAL HYSTERECTOMY  10-07-2000  . TREATMENT FISTULA ANAL    . TUBAL LIGATION  1987    Review of systems negative except as noted in HPI / PMHx or noted below:  Review of Systems  Constitutional: Negative.   HENT: Negative.   Eyes: Negative.   Respiratory: Negative.   Cardiovascular: Negative.   Gastrointestinal: Negative.   Genitourinary: Negative.   Musculoskeletal: Negative.   Skin: Negative.   Neurological: Negative.   Endo/Heme/Allergies: Negative.   Psychiatric/Behavioral: Negative.      Objective:   Vitals:   04/16/17 1045  BP: 102/68  Pulse: 74  Resp: 16  Temp: 97.7 F (36.5 C)  SpO2: 96%          Physical Exam  Constitutional: She is well-developed, well-nourished, and in no distress.  Raspy voice  HENT:  Head: Normocephalic.  Right Ear: External ear and ear canal normal. Tympanic membrane is scarred.  Left Ear: External ear and ear  canal normal. Tympanic membrane is scarred.  Nose: Nose normal. No mucosal edema or rhinorrhea.  Mouth/Throat: Uvula is midline, oropharynx is clear and moist and mucous membranes are normal. No oropharyngeal  exudate.  Eyes: Conjunctivae are normal.  Neck: Trachea normal. No tracheal tenderness present. No tracheal deviation present. No thyromegaly present.  Cardiovascular: Normal rate, regular rhythm, S1 normal, S2 normal and normal heart sounds.   No murmur heard. Pulmonary/Chest: Breath sounds normal. No stridor. No respiratory distress. She has no wheezes. She has no rales.  Musculoskeletal: She exhibits no edema.  Lymphadenopathy:       Head (right side): No tonsillar adenopathy present.       Head (left side): No tonsillar adenopathy present.    She has no cervical adenopathy.  Neurological: She is alert. Gait normal.  Skin: No rash noted. She is not diaphoretic. No erythema. Nails show no clubbing.  Psychiatric: Mood and affect normal.    Diagnostics: Results of a gastric emptying scan obtained 12/13/2016 identified the following:  Expected location of the stomach in the left upper quadrant. Ingested meal empties the stomach gradually over the course of the study.  13% emptied at 1 hr ( normal >= 10%)  25% emptied at 2 hr ( normal >= 40%)  25% emptied at 3 hr ( normal >= 70%)  35% emptied at 4 hr ( normal >= 90%)  IMPRESSION: Delayed gastric emptying.  Results of a barium swallow obtained 01/17/2017 identified the following:  The oropharyngeal swallowing mechanisms are normal. Valleculae and piriform sinuses are bilaterally symmetrical and normal.  The mucosa and motility of the esophagus are normal. There is a prominent phrenic ampulla, a normal finding. No appreciable hiatal hernia.  The patient ingested a 13 mm barium tablet which passed immediately from the mouth to the stomach with no delay.  IMPRESSION: Normal barium esophagram.   Spirometry was performed and demonstrated an FEV1 of 1.36 at 79 % of predicted.  The patient had an Asthma Control Test with the following results: ACT Total Score: 18.    Assessment and Plan:   1. Asthma, moderate persistent,  well-controlled   2. Other allergic rhinitis   3. LPRD (laryngopharyngeal reflux disease)   4. Delayed gastric emptying     1. Continue to Treat and prevent inflammation with the following:   A. Flonase - one spray each nostril twice a day   B. Symbicort 160 - 2 puffs twice a day with spacer  C. montelukast 10 mg-  tablet 1 time per day  2. Continue to Treat and prevent reflux with the following:   A. Continue to consolidate all caffeine consumption slowly  B. Nexium 40 mg TWO TIMES A DAY  C. Ranitidine 300 mg in PM  3. If needed:   A. Ventolin HFA 2 puffs every 4-6 hours  B. Azelastine 1-2 puffs each nostril one-2 times per day  4. Return to clinic in 6 months or earlier if problem  5. Obtain fall flu vaccine  6. Follow-up with Dr. Collene Mares concerning further treatment of delayed gastric emptying.  I think with [name] [name]'s current medical plan this is the best were going to get regarding control of her respiratory tract symptoms. She will continue to utilize a combination of anti-inflammatory agents and aggressive therapy directed against reflux. I suspect that there is still a reflux component to her respiratory tract symptoms especially given her delayed gastric emptying and I have asked her to follow-up with Dr. Collene Mares concerning further management of  this issue as there may be some medication that can be administered to increase gastric emptying which will probably help her reflux-induced respiratory disease. I will see her back in this clinic in 6 months or earlier if there is a problem.  Allena Katz, MD Allergy / Immunology [place]

## 2017-04-16 NOTE — Patient Instructions (Addendum)
  1. Continue to Treat and prevent inflammation with the following:   A. Flonase - one spray each nostril twice a day   B. Symbicort 160 - 2 puffs twice a day with spacer  C. montelukast 10 mg-  tablet 1 time per day  2. Continue to Treat and prevent reflux with the following:   A. Continue to consolidate all caffeine consumption slowly  B. Nexium 40 mg TWO TIMES A DAY  C. Ranitidine 300 mg in PM  3. If needed:   A. Ventolin HFA 2 puffs every 4-6 hours  B. Azelastine 1-2 puffs each nostril one-2 times per day  4. Return to clinic in 6 months or earlier if problem  5. Obtain fall flu vaccine  6. Follow-up with Dr. Collene Mares concerning further treatment of delayed gastric emptying

## 2017-04-17 ENCOUNTER — Telehealth: Payer: Self-pay | Admitting: *Deleted

## 2017-04-17 DIAGNOSIS — L84 Corns and callosities: Secondary | ICD-10-CM

## 2017-04-17 DIAGNOSIS — E1149 Type 2 diabetes mellitus with other diabetic neurological complication: Secondary | ICD-10-CM

## 2017-04-17 NOTE — Addendum Note (Signed)
Addended byDorcas Mcmurray L on: 04/17/2017 11:03 AM   Modules accepted: Orders

## 2017-04-17 NOTE — Telephone Encounter (Signed)
Patient calling to check status of podiatry referral. MD mentions referral in office note but referral never placed. Patient informed that I would place referral now. FYI to MD

## 2017-04-30 ENCOUNTER — Encounter: Payer: Self-pay | Admitting: Family Medicine

## 2017-04-30 ENCOUNTER — Ambulatory Visit (INDEPENDENT_AMBULATORY_CARE_PROVIDER_SITE_OTHER): Payer: Medicare HMO | Admitting: Family Medicine

## 2017-04-30 VITALS — BP 136/62 | HR 78 | Temp 97.9°F | Ht 61.0 in | Wt 210.4 lb

## 2017-04-30 DIAGNOSIS — M25552 Pain in left hip: Secondary | ICD-10-CM | POA: Diagnosis not present

## 2017-04-30 DIAGNOSIS — Z23 Encounter for immunization: Secondary | ICD-10-CM | POA: Diagnosis not present

## 2017-04-30 MED ORDER — METHYLPREDNISOLONE ACETATE 40 MG/ML IJ SUSP
40.0000 mg | Freq: Once | INTRAMUSCULAR | Status: AC
  Administered 2017-04-30: 40 mg via INTRAMUSCULAR

## 2017-05-01 ENCOUNTER — Encounter: Payer: Self-pay | Admitting: Pharmacist

## 2017-05-01 ENCOUNTER — Ambulatory Visit (INDEPENDENT_AMBULATORY_CARE_PROVIDER_SITE_OTHER): Payer: Medicare HMO | Admitting: Pharmacist

## 2017-05-01 DIAGNOSIS — E1149 Type 2 diabetes mellitus with other diabetic neurological complication: Secondary | ICD-10-CM

## 2017-05-01 MED ORDER — INSULIN ASPART 100 UNIT/ML FLEXPEN: 50.0000 [IU] | PEN_INJECTOR | Freq: Two times a day (BID) | SUBCUTANEOUS | 5 refills | Status: DC

## 2017-05-01 MED ORDER — INSULIN GLARGINE 100 UNIT/ML SOLOSTAR PEN: 40.0000 [IU] | PEN_INJECTOR | Freq: Two times a day (BID) | SUBCUTANEOUS | 3 refills | Status: DC

## 2017-05-01 NOTE — Patient Instructions (Addendum)
Thanks for coming to see [place] today!  1. Increase your Novolog to 50 units injected 2 times a day with meals.  2. Change your Lantus dose to 40 units injected 2 times a day. You can take this at the same time as your meal time insulin. If you don't eat breakfast, make sure that you still take the morning dose of Lantus.   3. Try to cut down on your starchy food intake and hopefully we will see improvements in your sugar!  Come back and see [place] at [facility] in 4-6 weeks. Give [place] a call if you are having any issues.

## 2017-05-01 NOTE — Assessment & Plan Note (Signed)
Diabetes longstanding currently uncontrolled with A1c 8.4. Patient reported fasting blood sugars average ~250-300, with low of 140 and high of 410. Patient reports hypoglycemic events and is able to verbalize appropriate hypoglycemia management plan. Patient reports adherence with medication. Control is suboptimal due to sedentary lifestyle, suboptimal regimen, and dietary indiscretion.  -increase Novolog from 40 units BID with meals to 50 units BID with meals. -Increase Lantus from 70 units QD to 40 units injected BID  -Continue taking Trulicity 1.5 mg injected weekly  -Next A1C anticipated November 2018.

## 2017-05-01 NOTE — Progress Notes (Signed)
Patient ID: [name] [name], female   DOB: 1951/05/19, 66 y.o.   MRN: [id number] Reviewed: Agree with Dr. Graylin Shiver documentation and management.

## 2017-05-01 NOTE — Progress Notes (Signed)
    CHIEF COMPLAINT / HPI:   1. F/u left axillary mass. Improved. Still a little tender. No drainage. No fever 2. Left hip pain--more on laterla hip than in groin. Difficulty lying on taht side at night. New sx for last 2 weeks but has had similar before and CSI helped.  REVIEW OF SYSTEMS:  See HPI  OBJECTIVE:  Vital signs are reviewed.   WD WN NAD HEENT: legally blind. Resting nystagmus. AXILLA: left area reveals no mass now. Still mild ttp.  SKIN in axilla is normal without lesion, no redness. HIPS: IR/ER are a little stiff but at her baseline. She is TTP left lateral hip over greater trochanter and palpation here reproduces her pain.  INJECTION: Patient was given informed consent, signed copy in the chart. Appropriate time out was taken. Area prepped and draped in usual sterile fashion. 1 cc of methylprednisolone 40 mg/ml plus  4 cc of 1% lidocaine without epinephrine was injected into the left greater trochanteric birsa of left hip using a(n) perpendicular approach. The patient tolerated the procedure well. There were no complications. Post procedure instructions were given.   ASSESSMENT / PLAN: 1. Axillary mass has alsmost totally resolved. I do thin this was inflamed apocrine gland. Will do nothing different uness it returns. 2. Greater trochanteric bursitis: CSI

## 2017-05-01 NOTE — Progress Notes (Signed)
    S:     Chief Complaint  Patient presents with  . Medication Management    diabetes    Patient arrives in good spirits ambulating with the assistance of a cane.  Presents for diabetes evaluation, education, and management at the request of Dr. Nori Riis. Patient was last seen by Primary Care Provider on 04/30/17. Recent trial of Januvia resulted in yeast infection.   Patient reports adherence with medications.  Current diabetes medications include: trulicity 1.5 mg weekly, lantus 70 units daily, novolog 40 units BID with meals  Current hypertension medications include: losartan 50 mg daily, metoprolol succinate 150 mg daily, spironolactone 25 daily   Patient denies hypoglycemic events.  Patient reported dietary habits: Eats 1-2 meals/day,reports that she has been eating more starchy foods.    Patient reports nocturia. 3-4 times a night   O:  Physical Exam  Constitutional: She appears well-developed and well-nourished.  Vitals reviewed.  Review of Systems  All other systems reviewed and are negative.   Lab Results  Component Value Date   HGBA1C 8.4 04/09/2017   Vitals:   05/01/17 1117  BP: 124/72    Home fasting CBG: low 140, high 410; average ~250-300   A/P: Diabetes longstanding currently uncontrolled with A1c 8.4. Patient reported fasting blood sugars average ~250-300, with low of 140 and high of 410. Patient reports hypoglycemic events and is able to verbalize appropriate hypoglycemia management plan. Patient reports adherence with medication. Control is suboptimal due to sedentary lifestyle, suboptimal regimen, and dietary indiscretion.  -increase Novolog from 40 units BID with meals to 50 units BID with meals. -Increase Lantus from 70 units QD to 40 units injected BID  -Continue taking Trulicity 1.5 mg injected weekly  -Next A1C anticipated November 2018.    ASCVD risk greater than 7.5%. Continued Aspirin 81 mg and Continued atorvastatin 40 mg.   Written patient  instructions provided. Total time in face to face counseling 30 minutes.   Follow up in Pharmacist Clinic Visit 4-6 weeks.   Patient seen with Cleotis Lema, PharmD Candidate, and Waverly Ferrari, PharmD Candidate.

## 2017-05-09 ENCOUNTER — Other Ambulatory Visit: Payer: Self-pay | Admitting: Family Medicine

## 2017-05-12 NOTE — Telephone Encounter (Signed)
Dear [name] Team Can you call in her tramadol with 5 refills as below [facility]! [name]

## 2017-05-12 NOTE — Telephone Encounter (Signed)
LVM with Rx information. Ottis Stain, CMA

## 2017-05-13 ENCOUNTER — Emergency Department (HOSPITAL_COMMUNITY): Payer: Medicare HMO

## 2017-05-13 ENCOUNTER — Encounter (HOSPITAL_COMMUNITY): Payer: Self-pay | Admitting: *Deleted

## 2017-05-13 ENCOUNTER — Emergency Department (HOSPITAL_COMMUNITY)
Admission: EM | Admit: 2017-05-13 | Discharge: 2017-05-14 | Disposition: A | Discharge status: Home / Self Care | Payer: Medicare HMO | Attending: Emergency Medicine | Admitting: Emergency Medicine

## 2017-05-13 DIAGNOSIS — R1032 Left lower quadrant pain: Secondary | ICD-10-CM | POA: Diagnosis not present

## 2017-05-13 DIAGNOSIS — Z87891 Personal history of nicotine dependence: Secondary | ICD-10-CM | POA: Insufficient documentation

## 2017-05-13 DIAGNOSIS — F419 Anxiety disorder, unspecified: Secondary | ICD-10-CM | POA: Diagnosis not present

## 2017-05-13 DIAGNOSIS — E119 Type 2 diabetes mellitus without complications: Secondary | ICD-10-CM | POA: Diagnosis not present

## 2017-05-13 DIAGNOSIS — I252 Old myocardial infarction: Secondary | ICD-10-CM | POA: Insufficient documentation

## 2017-05-13 DIAGNOSIS — K5900 Constipation, unspecified: Secondary | ICD-10-CM | POA: Insufficient documentation

## 2017-05-13 DIAGNOSIS — J45909 Unspecified asthma, uncomplicated: Secondary | ICD-10-CM | POA: Insufficient documentation

## 2017-05-13 DIAGNOSIS — I10 Essential (primary) hypertension: Secondary | ICD-10-CM | POA: Insufficient documentation

## 2017-05-13 DIAGNOSIS — Z7982 Long term (current) use of aspirin: Secondary | ICD-10-CM | POA: Insufficient documentation

## 2017-05-13 DIAGNOSIS — Z794 Long term (current) use of insulin: Secondary | ICD-10-CM | POA: Diagnosis not present

## 2017-05-13 DIAGNOSIS — Z79899 Other long term (current) drug therapy: Secondary | ICD-10-CM | POA: Diagnosis not present

## 2017-05-13 DIAGNOSIS — F329 Major depressive disorder, single episode, unspecified: Secondary | ICD-10-CM | POA: Insufficient documentation

## 2017-05-13 DIAGNOSIS — J449 Chronic obstructive pulmonary disease, unspecified: Secondary | ICD-10-CM | POA: Insufficient documentation

## 2017-05-13 DIAGNOSIS — R109 Unspecified abdominal pain: Secondary | ICD-10-CM | POA: Diagnosis not present

## 2017-05-13 LAB — URINALYSIS, ROUTINE W REFLEX MICROSCOPIC
Bilirubin Urine: NEGATIVE
Glucose, UA: NEGATIVE mg/dL
Hgb urine dipstick: NEGATIVE
Ketones, ur: NEGATIVE mg/dL
Leukocytes, UA: NEGATIVE
Nitrite: NEGATIVE
Protein, ur: NEGATIVE mg/dL
Specific Gravity, Urine: 1.009 (ref 1.005–1.030)
pH: 5 (ref 5.0–8.0)

## 2017-05-13 LAB — CBC
HCT: 39.5 % (ref 36.0–46.0)
Hemoglobin: 12.5 g/dL (ref 12.0–15.0)
MCH: 28.9 pg (ref 26.0–34.0)
MCHC: 31.6 g/dL (ref 30.0–36.0)
MCV: 91.2 fL (ref 78.0–100.0)
Platelets: 203 10*3/uL (ref 150–400)
RBC: 4.33 MIL/uL (ref 3.87–5.11)
RDW: 13.7 % (ref 11.5–15.5)
WBC: 12.2 10*3/uL — ABNORMAL HIGH (ref 4.0–10.5)

## 2017-05-13 LAB — COMPREHENSIVE METABOLIC PANEL
ALT: 37 U/L (ref 14–54)
AST: 43 U/L — ABNORMAL HIGH (ref 15–41)
Albumin: 3.5 g/dL (ref 3.5–5.0)
Alkaline Phosphatase: 132 U/L — ABNORMAL HIGH (ref 38–126)
Anion gap: 9 (ref 5–15)
BUN: 9 mg/dL (ref 6–20)
CO2: 27 mmol/L (ref 22–32)
Calcium: 10 mg/dL (ref 8.9–10.3)
Chloride: 104 mmol/L (ref 101–111)
Creatinine, Ser: 0.73 mg/dL (ref 0.44–1.00)
GFR calc Af Amer: >60 mL/min (ref >60)
GFR calc non Af Amer: >60 mL/min (ref >60)
Glucose, Bld: 154 mg/dL — ABNORMAL HIGH (ref 65–99)
Potassium: 3.8 mmol/L (ref 3.5–5.1)
Sodium: 140 mmol/L (ref 135–145)
Total Bilirubin: 0.6 mg/dL (ref 0.3–1.2)
Total Protein: 7.4 g/dL (ref 6.5–8.1)

## 2017-05-13 LAB — LIPASE, BLOOD: Lipase: 42 U/L (ref 11–51)

## 2017-05-13 MED ORDER — SODIUM CHLORIDE 0.9 % IV BOLUS (SEPSIS)
1000.0000 mL | Freq: Once | INTRAVENOUS | Status: AC
  Administered 2017-05-13: 1000 mL via INTRAVENOUS

## 2017-05-13 MED ORDER — DICYCLOMINE HCL 20 MG PO TABS: 20.0000 mg | ORAL_TABLET | Freq: Two times a day (BID) | ORAL | 0 refills | Status: DC

## 2017-05-13 MED ORDER — ONDANSETRON HCL 4 MG/2ML IJ SOLN
4.0000 mg | Freq: Once | INTRAMUSCULAR | Status: AC
  Administered 2017-05-13: 4 mg via INTRAVENOUS
  Filled 2017-05-13: qty 2

## 2017-05-13 MED ORDER — IOPAMIDOL (ISOVUE-300) INJECTION 61%
INTRAVENOUS | Status: AC
  Administered 2017-05-13: 100 mL
  Filled 2017-05-13: qty 100

## 2017-05-13 MED ORDER — FENTANYL CITRATE (PF) 100 MCG/2ML IJ SOLN
50.0000 ug | Freq: Once | INTRAMUSCULAR | Status: AC
  Administered 2017-05-13: 50 ug via INTRAVENOUS
  Filled 2017-05-13: qty 2

## 2017-05-13 NOTE — ED Triage Notes (Signed)
PT is here with LLQ pain for last 5 hours.  Pt reports nausea and loose stools

## 2017-05-13 NOTE — ED Provider Notes (Signed)
[facility] DEPT Provider Note   CSN: [id number] Arrival date & time: 05/13/17  6962    History   Chief Complaint Chief Complaint  Patient presents with  . Abdominal Pain    HPI [name] [name] is a 66 y.o. female.  66 year old female with a history of hypertension, COPD, diabetes, chronic low back pain, and depression presents to the emergency department for abdominal pain which began shortly after breakfast this morning. Pain is located in the LLQ. It radiates to the left side and is intermittent, sharp. Patient denies modifying factors. She has noted associated nausea without vomiting as well as nonbloody diarrhea. No recent abx use or recent travel. No fevers. She does have a hx of diverticulitis. Last colonoscopy was in June with Dr. Collene Mares. Abdominal surgical hx significant for TAH, C-Section.   The history is provided by the patient. No language interpreter was used.    Past Medical History:  Diagnosis Date  . Allergy   . Angina   . Anxiety   . Arthritis   . Asthma   . Blood transfusion   . Blood transfusion without reported diagnosis   . Cataract   . Chronic cough    Now followed by pulmonary  . Chronic lower back pain   . Complication of anesthesia    "just wake up coughing; that's all"  . Concussion 11/18/11   "fell at dr's office; hit head"  . COPD (chronic obstructive pulmonary disease) ([place])   . Delayed gastric emptying   . Depression   . Diabetes mellitus type II    "take insulin & pills"  . Dyslipidemia   . Exertional dyspnea   . GERD (gastroesophageal reflux disease)   . Glaucoma   . Gout   . Headache(784.0) 11/18/11   "I've had mild headaches the last couple days"  . Headache(784.0) 01/08/12   "pretty constant since 11/18/11's concussion"  . Heart murmur   . History of colonoscopy   . HTN (hypertension)   . Myocardial infarction ([place])   . Osteoporosis   . Peripheral neuropathy   . Pneumonia    "i've had it once" (11/18/11)  . Sleep  apnea    on CPAP 12  . Syncope 06/16/2012  . Vocal cord paralysis, unilateral partial    "right"    Patient Active Problem List   Diagnosis Date Noted  . Delayed gastric emptying 01/23/2017  . Other dysphagia 01/21/2017  . History of wrist fracture, left 03/06/2016  . Cough variant asthma 06/25/2015  . Major depression, recurrent ([place]) 03/23/2015  . Generalized anxiety disorder 03/23/2015  . Vitamin D deficiency 06/30/2014  . Bee sting allergy 06/30/2014  . Chronic headaches 12/27/2013  . COPD mixed type ([place]) 04/28/2013  . Callous ulcer LEFT foot MT area  ([place]) 01/15/2013  . Chronic flank pain 10/07/2012  . Fusion of spine of cervical region 06/03/2012  . Acquired cyst of kidney 05/28/2012  . Incomplete bladder emptying 05/28/2012  . Legally BLIND, bilateral 02/05/2012  . Nail dystrophy 11/15/2011  . Hip arthritis, bilateral hip pain 05/29/2011  . DJD (degenerative joint disease) of knee 05/01/2011  . Allergic rhinitis due to pollen 01/24/2011  . GAIT IMBALANCE 05/30/2010  . CARPAL TUNNEL SYNDROME, BILATERAL 12/28/2009  . Hyperlipidemia 05/01/2009  . HERPES SIMPLEX INFECTION, RECURRENT 09/14/2008  . NEURAL HEARING LOSS BILATERAL 03/02/2008  . Cataract extraction status 09/10/2007  . Unilateral complete paralysis of vocal cord 02/17/2007  . GERD 12/12/2006  . Diabetes mellitus type 2 with neurological manifestations ([place])  10/30/2006  . Major depressive disorder, recurrent episode ([place]) 10/30/2006  . Obstructive sleep apnea 10/30/2006  . Glaucoma 10/30/2006  . HYPERTENSION, BENIGN SYSTEMIC 10/30/2006    Past Surgical History:  Procedure Laterality Date  . ANTERIOR CERVICAL DECOMP/DISCECTOMY FUSION  01/15/2012   Procedure: ANTERIOR CERVICAL DECOMPRESSION/DISCECTOMY FUSION 3 LEVELS;  Surgeon: Eustace Moore, MD;  Location: [facility];  Service: Neurosurgery;  Laterality: N/A;  Anterior Cervical Decompression Discectomy Fusion Cerivcal three four, cervical four five,  cervical five six, cervical six seven  . BREAST BIOPSY     bilaterally  . BREAST CYST EXCISION     right twice; left once  . BREAST REDUCTION SURGERY  04-21-2003  . BRONCHOSCOPY  07-2007  . CARDIAC CATHETERIZATION  03-02-2004  . CARPAL TUNNEL RELEASE     left  . CATARACT EXTRACTION  1955; 1979   bilateral; right eye  . CESAREAN SECTION  1987  . COLONOSCOPY W/ BIOPSIES  11/21/2010   adenoma polyp--no hi grade dysplasia Dr Collene Mares  . EYE SURGERY    . KNEE ARTHROSCOPY  08/2000   right  . LEFT AND RIGHT HEART CATHETERIZATION WITH CORONARY ANGIOGRAM N/A 01/19/2014   Procedure: LEFT AND RIGHT HEART CATHETERIZATION WITH CORONARY ANGIOGRAM;  Surgeon: Sinclair Grooms, MD;  Location: [facility] C[facility] LAB;  Service: Cardiovascular;  Laterality: N/A;  . SHOULDER ARTHROSCOPY  08/2000   right  . SHOULDER ARTHROSCOPY W/ ROTATOR CUFF REPAIR  10/18/2003   left  . SPINE SURGERY    . T-score  09/02/04   -0.54 (low risk currently)  . TOTAL ABDOMINAL HYSTERECTOMY  10-07-2000  . TREATMENT FISTULA ANAL    . TUBAL LIGATION  1987    OB History    No data available       Home Medications    Prior to Admission medications   Medication Sig Start Date End Date Taking? Authorizing Provider  acyclovir (ZOVIRAX) 200 MG capsule Take 1 capsule (200 mg total) by mouth 2 (two) times daily. 12/25/16  Yes Dickie La, MD  albuterol (VENTOLIN HFA) 108 (90 Base) MCG/ACT inhaler INHALE 2 PUFFS INTO THE LUNGS EVERY 6  HOURS AS NEEDED FOR WHEEZING OR SHORTNESS OF BREATH. 03/31/17  Yes Dickie La, MD  allopurinol (ZYLOPRIM) 300 MG tablet TAKE 1 TABLET EVERY DAY Patient taking differently: TAKE 300 mg TABLET EVERY DAY 07/17/16  Yes Dickie La, MD  ammonium lactate (LAC-HYDRIN) 12 % lotion Apply 1 application topically 2 (two) times daily as needed for dry skin. 09/11/16  Yes Dickie La, MD  aspirin EC 81 MG tablet Take 81 mg by mouth daily.   Yes [provider]  atorvastatin (LIPITOR) 40 MG tablet TAKE 1 TABLET EVERY  DAY Patient taking differently: TAKE 40 mg TABLET EVERY DAY 12/12/16  Yes Dickie La, MD  azelastine (ASTELIN) 0.1 % nasal spray USE 2 SPRAYS IN EACH NOSTRIL EVERY DAY AS NEEDED AS DIRECTED 07/31/16  Yes Dickie La, MD  budesonide-formoterol [facility]) 160-4.5 MCG/ACT inhaler Inhale 2 puffs into the lungs 2 (two) times daily. Rinse, gargle, and spit after use. 04/16/17  Yes Kozlow, Donnamarie Poag, MD  buPROPion (WELLBUTRIN) 100 MG tablet Take 1 tablet (100 mg total) by mouth 2 (two) times daily. Patient taking differently: Take 200 mg by mouth daily.  01/09/17 01/09/18 Yes Charlcie Cradle, MD  COMBIGAN 0.2-0.5 % ophthalmic solution Place 1 drop into the left eye every 12 (twelve) hours.  06/18/16  Yes [provider]  Dulaglutide ([place])  1.5 MG/0.5ML SOPN Inject 1.5 mg into the skin once a week. 02/20/17  Yes Hensel, Jamal Collin, MD  EPINEPHrine 0.3 mg/0.3 mL IJ SOAJ injection Inject 0.3 mLs (0.3 mg total) into the muscle once as needed (for allergic reaction). 01/05/16  Yes Hensel, Jamal Collin, MD  esomeprazole ([place]) 40 MG capsule TAKE 1 CAPSULE EVERY DAY Patient taking differently: TAKE 40 mg CAPSULE EVERY DAY 12/12/16  Yes Dickie La, MD  ezetimibe (ZETIA) 10 MG tablet Take 1 tablet (10 mg total) by mouth daily. 10/02/16 01/23/18 Yes Fay Records, MD  fluticasone (FLONASE) 50 MCG/ACT nasal spray Place 1 spray into both nostrils 2 (two) times daily. 04/16/17  Yes Kozlow, Donnamarie Poag, MD  furosemide (LASIX) 40 MG tablet TAKE 1 TABLET EVERY DAY Patient taking differently: TAKE 40 mg TABLET EVERY DAY 07/17/16  Yes Dickie La, MD  ibuprofen (ADVIL,MOTRIN) 600 MG tablet Take 1 tablet (600 mg total) by mouth every 8 (eight) hours as needed. 11/22/16  Yes Luiz Blare Y, DO  insulin aspart (NOVOLOG FLEXPEN) 100 UNIT/ML FlexPen Inject 50 Units into the skin 2 (two) times daily with a meal. 05/01/17  Yes Hensel, Jamal Collin, MD  Insulin Glargine (LANTUS SOLOSTAR) 100 UNIT/ML Solostar Pen Inject 40 Units into  the skin 2 (two) times daily. 05/01/17  Yes Hensel, Jamal Collin, MD  latanoprost (XALATAN) 0.005 % ophthalmic solution Place 1 drop into both eyes at bedtime.   Yes [provider]  LINZESS 290 MCG CAPS capsule Take 290 mcg by mouth daily.  08/13/16  Yes [provider]  losartan (COZAAR) 50 MG tablet TAKE 1 TABLET EVERY DAY Patient taking differently: TAKE 50 mg TABLET EVERY DAY 10/03/16  Yes Dickie La, MD  metoCLOPramide (REGLAN) 10 MG tablet Take 5 mg by mouth 2 (two) times daily.    Yes [provider]  metoprolol succinate (TOPROL-XL) 50 MG 24 hr tablet TAKE 3 TABLETS EVERY DAY  WITH  OR  IMMEDIATELY  FOLLOWING A MEAL Patient taking differently: TAKE 150 mg TABLETS EVERY DAY  WITH  OR  IMMEDIATELY  FOLLOWING A MEAL 11/01/16  Yes Dickie La, MD  montelukast (SINGULAIR) 10 MG tablet TAKE 1 TABLET EVERY DAY Patient taking differently: TAKE 10 mg TABLET EVERY DAY 12/05/16  Yes Dickie La, MD  Multiple Vitamins-Minerals (MULTIVITAMIN PO) Take 1 tablet by mouth daily.   Yes [provider]  nitroGLYCERIN (NITROSTAT) 0.4 MG SL tablet Place 1 tablet (0.4 mg total) under the tongue every 5 (five) minutes as needed for chest pain. 01/05/16  Yes Hensel, Jamal Collin, MD  ranitidine (ZANTAC) 300 MG tablet Take 1 tablet (300 mg total) by mouth at bedtime. 04/16/17  Yes Kozlow, Donnamarie Poag, MD  sertraline (ZOLOFT) 100 MG tablet Take 1 tablet (100 mg total) by mouth daily. Patient taking differently: Take 100 mg by mouth at bedtime.  01/09/17  Yes Charlcie Cradle, MD  spironolactone (ALDACTONE) 25 MG tablet TAKE 1 TABLET EVERY DAY BEFORE SUPPER Patient taking differently: TAKE 25 TABLET EVERY DAY BEFORE SUPPER 04/29/16  Yes Dickie La, MD  traMADol (ULTRAM) 50 MG tablet TAKE 1-2 TABLETS BY MOUTH EVERY 8 HOURS AS NEEDED FOR PAIN. MAX 6 TABLETS PER 24 HOURS 05/12/17  Yes Dickie La, MD  [place] TEST BLOOD SUGAR  TWO  TO THREE TIMES DAILY Patient not taking:  Reported on 05/13/2017 08/04/16   Dickie La, MD  budesonide-formoterol [facility]) 160-4.5 MCG/ACT inhaler Inhale 2 puffs  into the lungs 2 (two) times daily. Patient not taking: Reported on 05/13/2017 03/19/17   Deneise Lever, MD  dicyclomine (BENTYL) 20 MG tablet Take 1 tablet (20 mg total) by mouth 2 (two) times daily. For abdominal pain/cramping 05/13/17   Antonietta Breach, PA-C    Family History Family History  Problem Relation Age of Onset  . Diabetes Mother   . Colon cancer Neg Hx   . Depression Neg Hx   . Anxiety disorder Neg Hx     Social History Social History  Substance Use Topics  . Smoking status: Former Smoker    Packs/day: 0.50    Years: 0.50    Types: Cigarettes    Quit date: 09/03/1975  . Smokeless tobacco: Never Used  . Alcohol use No     Allergies   Bee venom; Propoxyphene hcl; Amlodipine besylate; Hydrocodone; Lisinopril; Metformin and related; Metronidazole; and Valsartan   Review of Systems Review of Systems Ten systems reviewed and are negative for acute change, except as noted in the HPI.    Physical Exam Updated Vital Signs BP (!) 151/75   Pulse 66   Temp 98.1 F (36.7 C) (Oral)   Resp 16   Ht 5\' 1"  (1.549 m)   Wt 91.2 kg (201 lb)   SpO2 100%   BMI 37.98 kg/m   Physical Exam  Constitutional: She is oriented to person, place, and time. She appears well-developed and well-nourished. No distress.  Nontoxic appearing and in NAD  HENT:  Head: Normocephalic and atraumatic.  Eyes: Conjunctivae and EOM are normal. No scleral icterus.  Neck: Normal range of motion.  Cardiovascular: Normal rate, regular rhythm and intact distal pulses.   Pulmonary/Chest: Effort normal. No respiratory distress.  Respirations even and unlabored  Abdominal: Soft. There is tenderness.  Soft obese abdomen with TTP in the L mid and lower quadrants. No peritoneal signs. Mild voluntary guarding without involuntary guarding.  Musculoskeletal: Normal range of motion.    Neurological: She is alert and oriented to person, place, and time. She exhibits normal muscle tone. Coordination normal.  Speech is goal oriented. Patient moving all extremities.  Skin: Skin is warm and dry. No rash noted. She is not diaphoretic. No erythema. No pallor.  Psychiatric: She has a normal mood and affect. Her behavior is normal.  Nursing note and vitals reviewed.    ED Treatments / Results  Labs (all labs ordered are listed, but only abnormal results are displayed) Labs Reviewed  COMPREHENSIVE METABOLIC PANEL - Abnormal; Notable for the following:       Result Value   Glucose, Bld 154 (*)    AST 43 (*)    Alkaline Phosphatase 132 (*)    All other components within normal limits  CBC - Abnormal; Notable for the following:    WBC 12.2 (*)    All other components within normal limits  GASTROINTESTINAL PANEL BY PCR, STOOL (REPLACES STOOL CULTURE)  LIPASE, BLOOD  URINALYSIS, ROUTINE W REFLEX MICROSCOPIC    EKG  EKG Interpretation None       Radiology Ct Abdomen Pelvis W Contrast  Result Date: 05/13/2017 CLINICAL DATA:  Abdominal pain EXAM: CT ABDOMEN AND PELVIS WITH CONTRAST TECHNIQUE: Multidetector CT imaging of the abdomen and pelvis was performed using the standard protocol following bolus administration of intravenous contrast. CONTRAST:  142mL ISOVUE-300 IOPAMIDOL (ISOVUE-300) INJECTION 61% COMPARISON:  04/26/2015 FINDINGS: Lower chest: No acute abnormality. Hepatobiliary: Hepatic steatosis. Normal appearance of the gallbladder. No space-occupying mass of the liver nor  biliary dilatation. Pancreas: Normal Spleen: Normal Adrenals/Urinary Tract: Normal bilateral adrenal glands and kidneys without obstructive uropathy. No enhancing renal masses. The urinary bladder is physiologic in appearance. Stomach/Bowel: Nondistended stomach. There is normal small bowel rotation. No bowel obstruction. Moderate colonic stool burden without bowel obstruction. A few right-sided  colonic diverticula are again noted without diverticulitis. A few sigmoid colonic diverticula are also present without inflammation. Normal appendix. Vascular/Lymphatic: Moderate upper abdominal aortic atherosclerosis without aneurysm. There is a moderate degree of atherosclerosis involving the proximal SMA. No SMA thrombosis. Portal vein and splenic vein are patent. No lymphadenopathy. Reproductive: Status post hysterectomy. No adnexal masses. Other: Small fat containing umbilical hernia. Musculoskeletal: Subchondral sclerosis of the femoral heads consistent with changes of AVN. No flattening however is identified. Lower lumbar facet arthropathy. No acute nor suspicious osseous abnormality. IMPRESSION: 1. Moderate colonic stool burden without bowel obstruction or inflammation. 2. Diverticulosis without acute diverticulitis.  Normal appendix. 3. Hepatic steatosis. 4. Aortoiliac and branch vessel atherosclerosis. 5. AVN of both femoral heads without flattening of the weight-bearing portion of the femoral heads. Electronically Signed   By: Ashley Royalty M.D.   On: 05/13/2017 22:55    Procedures Procedures (including critical care time)  Medications Ordered in ED Medications  sodium chloride 0.9 % bolus 1,000 mL (0 mLs Intravenous Stopped 05/13/17 2318)  ondansetron (ZOFRAN) injection 4 mg (4 mg Intravenous Given 05/13/17 2116)  fentaNYL (SUBLIMAZE) injection 50 mcg (50 mcg Intravenous Given 05/13/17 2119)  iopamidol (ISOVUE-300) 61 % injection (100 mLs  Contrast Given 05/13/17 2204)     Initial Impression / Assessment and Plan / ED Course  I have reviewed the triage vital signs and the nursing notes.  Pertinent labs & imaging results that were available during my care of the patient were reviewed by me and considered in my medical decision making (see chart for details).     66 year old female presents to the emergency department for evaluation of left lower quadrant abdominal pain associated with  diarrhea. Symptoms began this morning after eating breakfast. Patient is alert and pleasant. She is afebrile with stable vital signs. Laboratory workup reviewed which is notable for a mild leukocytosis of 12.2.  Decision was made to proceed with CT scan for further evaluation of symptoms. CT shows moderate constipation without other acute pathology in the abdomen or pelvis. On repeat assessment, patient continues to rest comfortably. She is hungry. Will give food and fluids. She is currently taking lens for constipation. I have recommended the use of a home enema as needed. Will prescribe Bentyl for abdominal pain. PCP follow up recommended to ensure resolution of symptoms. Return precautions discussed and provided. Patient discharged in stable condition with no unaddressed concerns.   Vitals:   05/13/17 2145 05/13/17 2245 05/13/17 2300 05/13/17 2315  BP: (!) 153/69 (!) 142/66 (!) 143/65 (!) 151/75  Pulse: 62 66 71 66  Resp:      Temp:      TempSrc:      SpO2: 97% 100% 94% 100%  Weight:      Height:        Final Clinical Impressions(s) / ED Diagnoses   Final diagnoses:  Left lower quadrant pain  Constipation, unspecified constipation type    New Prescriptions New Prescriptions   DICYCLOMINE (BENTYL) 20 MG TABLET    Take 1 tablet (20 mg total) by mouth 2 (two) times daily. For abdominal pain/cramping     Antonietta Breach, PA-C 05/13/17 2347    Daleen Bo, MD 05/14/17  0023  

## 2017-05-13 NOTE — ED Provider Notes (Signed)
   Face-to-face evaluation   History: She presents for evaluation of abdominal pain, after eating today.  She has been constipated chronically and takes Linzess.  Today she has had multiple episodes of loose bowel movements.  Physical exam: Alert, cooperative.  Abdomen soft and mildly tender in the left lower quadrant.  Medical screening examination/treatment/procedure(s) were conducted as a shared visit with non-physician practitioner(s) and myself.  I personally evaluated the patient during the encounter     Daleen Bo, MD 05/14/17 [phone]

## 2017-05-13 NOTE — ED Notes (Signed)
Patient able to ambulate at baseline with cane.

## 2017-05-13 NOTE — Discharge Instructions (Signed)
Continue your Linzess for constipation. You may also try over-the-counter enemas if needed. Take Bentyl as prescribed for abdominal pain. Follow-up with your primary care doctor to ensure resolution of symptoms.

## 2017-05-15 ENCOUNTER — Other Ambulatory Visit: Payer: Self-pay | Admitting: Family Medicine

## 2017-05-15 ENCOUNTER — Ambulatory Visit (HOSPITAL_COMMUNITY): Payer: Medicare HMO | Admitting: Psychiatry

## 2017-05-22 DIAGNOSIS — H59011 Keratopathy (bullous aphakic) following cataract surgery, right eye: Secondary | ICD-10-CM | POA: Diagnosis not present

## 2017-05-22 DIAGNOSIS — H04123 Dry eye syndrome of bilateral lacrimal glands: Secondary | ICD-10-CM | POA: Diagnosis not present

## 2017-05-22 DIAGNOSIS — H401133 Primary open-angle glaucoma, bilateral, severe stage: Secondary | ICD-10-CM | POA: Diagnosis not present

## 2017-05-22 DIAGNOSIS — E119 Type 2 diabetes mellitus without complications: Secondary | ICD-10-CM | POA: Diagnosis not present

## 2017-05-22 LAB — HM DIABETES EYE EXAM

## 2017-06-05 DIAGNOSIS — K219 Gastro-esophageal reflux disease without esophagitis: Secondary | ICD-10-CM | POA: Diagnosis not present

## 2017-06-05 DIAGNOSIS — K760 Fatty (change of) liver, not elsewhere classified: Secondary | ICD-10-CM | POA: Diagnosis not present

## 2017-06-05 DIAGNOSIS — K3184 Gastroparesis: Secondary | ICD-10-CM | POA: Diagnosis not present

## 2017-06-05 DIAGNOSIS — K5730 Diverticulosis of large intestine without perforation or abscess without bleeding: Secondary | ICD-10-CM | POA: Diagnosis not present

## 2017-06-10 ENCOUNTER — Encounter: Payer: Self-pay | Admitting: Family Medicine

## 2017-06-11 ENCOUNTER — Ambulatory Visit (INDEPENDENT_AMBULATORY_CARE_PROVIDER_SITE_OTHER): Payer: Medicare HMO | Admitting: *Deleted

## 2017-06-11 ENCOUNTER — Encounter: Payer: Self-pay | Admitting: *Deleted

## 2017-06-11 VITALS — BP 128/68 | HR 79 | Temp 97.9°F | Ht 61.0 in | Wt 200.0 lb

## 2017-06-11 DIAGNOSIS — Z0000 Encounter for general adult medical examination without abnormal findings: Secondary | ICD-10-CM | POA: Diagnosis not present

## 2017-06-11 DIAGNOSIS — Z114 Encounter for screening for human immunodeficiency virus [HIV]: Secondary | ICD-10-CM | POA: Diagnosis not present

## 2017-06-11 DIAGNOSIS — Z0001 Encounter for general adult medical examination with abnormal findings: Secondary | ICD-10-CM

## 2017-06-11 DIAGNOSIS — Z1159 Encounter for screening for other viral diseases: Secondary | ICD-10-CM

## 2017-06-11 DIAGNOSIS — Z9189 Other specified personal risk factors, not elsewhere classified: Secondary | ICD-10-CM

## 2017-06-11 NOTE — Patient Instructions (Addendum)
Ms. Gural,  Thank you for taking time to come for yourMedicare Wellness Visit. I appreciate your ongoing commitment to your health goals. Please review the following plan we discussed and let me know if I can assist you in the future.   These are the goals we discussed:  Goals    . Exercise 2x per week (15-30 min per time)    . HEMOGLOBIN A1C < 8    . Weight < 186 lb (84.4 kg) (pt-stated)          7% weight loss       Diabetes and Foot Care Diabetes may cause you to have problems because of poor blood supply (circulation) to your feet and legs. This may cause the skin on your feet to become thinner, break easier, and heal more slowly. Your skin may become dry, and the skin may peel and crack. You may also have nerve damage in your legs and feet causing decreased feeling in them. You may not notice minor injuries to your feet that could lead to infections or more serious problems. Taking care of your feet is one of the most important things you can do for yourself. Follow these instructions at home:  Wear shoes at all times, even in the house. Do not go barefoot. Bare feet are easily injured.  Check your feet daily for blisters, cuts, and redness. If you cannot see the bottom of your feet, use a mirror or ask someone for help.  Wash your feet with warm water (do not use hot water) and mild soap. Then pat your feet and the areas between your toes until they are completely dry. Do not soak your feet as this can dry your skin.  Apply a moisturizing lotion or petroleum jelly (that does not contain alcohol and is unscented) to the skin on your feet and to dry, brittle toenails. Do not apply lotion between your toes.  Trim your toenails straight across. Do not dig under them or around the cuticle. File the edges of your nails with an emery board or nail file.  Do not cut corns or calluses or try to remove them with medicine.  Wear clean socks or stockings every day. Make sure they are not  too tight. Do not wear knee-high stockings since they may decrease blood flow to your legs.  Wear shoes that fit properly and have enough cushioning. To break in new shoes, wear them for just a few hours a day. This prevents you from injuring your feet. Always look in your shoes before you put them on to be sure there are no objects inside.  Do not cross your legs. This may decrease the blood flow to your feet.  If you find a minor scrape, cut, or break in the skin on your feet, keep it and the skin around it clean and dry. These areas may be cleansed with mild soap and water. Do not cleanse the area with peroxide, alcohol, or iodine.  When you remove an adhesive bandage, be sure not to damage the skin around it.  If you have a wound, look at it several times a day to make sure it is healing.  Do not use heating pads or hot water bottles. They may burn your skin. If you have lost feeling in your feet or legs, you may not know it is happening until it is too late.  Make sure your health care provider performs a complete foot exam at least annually or more  often if you have foot problems. Report any cuts, sores, or bruises to your health care provider immediately. Contact a health care provider if:  You have an injury that is not healing.  You have cuts or breaks in the skin.  You have an ingrown nail.  You notice redness on your legs or feet.  You feel burning or tingling in your legs or feet.  You have pain or cramps in your legs and feet.  Your legs or feet are numb.  Your feet always feel cold. Get help right away if:  There is increasing redness, swelling, or pain in or around a wound.  There is a red line that goes up your leg.  Pus is coming from a wound.  You develop a fever or as directed by your health care provider.  You notice a bad smell coming from an ulcer or wound. This information is not intended to replace advice given to you by your health care provider.  Make sure you discuss any questions you have with your health care provider. Document Released: 08/16/2000 Document Revised: 01/25/2016 Document Reviewed: 01/26/2013 Elsevier Interactive Patient Education  2017 [place] Prevention in the Home Falls can cause injuries. They can happen to people of all ages. There are many things you can do to make your home safe and to help prevent falls. What can I do on the outside of my home?  Regularly fix the edges of walkways and driveways and fix any cracks.  Remove anything that might make you trip as you walk through a door, such as a raised step or threshold.  Trim any bushes or trees on the path to your home.  Use bright outdoor lighting.  Clear any walking paths of anything that might make someone trip, such as rocks or tools.  Regularly check to see if handrails are loose or broken. Make sure that both sides of any steps have handrails.  Any raised decks and porches should have guardrails on the edges.  Have any leaves, snow, or ice cleared regularly.  Use sand or salt on walking paths during winter.  Clean up any spills in your garage right away. This includes oil or grease spills. What can I do in the bathroom?  Use night lights.  Install grab bars by the toilet and in the tub and shower. Do not use towel bars as grab bars.  Use non-skid mats or decals in the tub or shower.  If you need to sit down in the shower, use a plastic, non-slip stool.  Keep the floor dry. Clean up any water that spills on the floor as soon as it happens.  Remove soap buildup in the tub or shower regularly.  Attach bath mats securely with double-sided non-slip rug tape.  Do not have throw rugs and other things on the floor that can make you trip. What can I do in the bedroom?  Use night lights.  Make sure that you have a light by your bed that is easy to reach.  Do not use any sheets or blankets that are too big for your bed. They  should not hang down onto the floor.  Have a firm chair that has side arms. You can use this for support while you get dressed.  Do not have throw rugs and other things on the floor that can make you trip. What can I do in the kitchen?  Clean up any spills right away.  Avoid walking  on wet floors.  Keep items that you use a lot in easy-to-reach places.  If you need to reach something above you, use a strong step stool that has a grab bar.  Keep electrical cords out of the way.  Do not use floor polish or wax that makes floors slippery. If you must use wax, use non-skid floor wax.  Do not have throw rugs and other things on the floor that can make you trip. What can I do with my stairs?  Do not leave any items on the stairs.  Make sure that there are handrails on both sides of the stairs and use them. Fix handrails that are broken or loose. Make sure that handrails are as long as the stairways.  Check any carpeting to make sure that it is firmly attached to the stairs. Fix any carpet that is loose or worn.  Avoid having throw rugs at the top or bottom of the stairs. If you do have throw rugs, attach them to the floor with carpet tape.  Make sure that you have a light switch at the top of the stairs and the bottom of the stairs. If you do not have them, ask someone to add them for you. What else can I do to help prevent falls?  Wear shoes that: ? Do not have high heels. ? Have rubber bottoms. ? Are comfortable and fit you well. ? Are closed at the toe. Do not wear sandals.  If you use a stepladder: ? Make sure that it is fully opened. Do not climb a closed stepladder. ? Make sure that both sides of the stepladder are locked into place. ? Ask someone to hold it for you, if possible.  Clearly mark and make sure that you can see: ? Any grab bars or handrails. ? First and last steps. ? Where the edge of each step is.  Use tools that help you move around (mobility aids) if  they are needed. These include: ? Canes. ? Walkers. ? Scooters. ? Crutches.  Turn on the lights when you go into a dark area. Replace any light bulbs as soon as they burn out.  Set up your furniture so you have a clear path. Avoid moving your furniture around.  If any of your floors are uneven, fix them.  If there are any pets around you, be aware of where they are.  Review your medicines with your doctor. Some medicines can make you feel dizzy. This can increase your chance of falling. Ask your doctor what other things that you can do to help prevent falls. This information is not intended to replace advice given to you by your health care provider. Make sure you discuss any questions you have with your health care provider. Document Released: 06/15/2009 Document Revised: 01/25/2016 Document Reviewed: 09/23/2014 Elsevier Interactive Patient Education  2018 [place] Maintenance, Female Adopting a healthy lifestyle and getting preventive care can go a long way to promote health and wellness. Talk with your health care provider about what schedule of regular examinations is right for you. This is a good chance for you to check in with your provider about disease prevention and staying healthy. In between checkups, there are plenty of things you can do on your own. Experts have done a lot of research about which lifestyle changes and preventive measures are most likely to keep you healthy. Ask your health care provider for more information. Weight and diet Eat a healthy diet  Be sure to include plenty of vegetables, fruits, low-fat dairy products, and lean protein.  Do not eat a lot of foods high in solid fats, added sugars, or salt.  Get regular exercise. This is one of the most important things you can do for your health. ? Most adults should exercise for at least 150 minutes each week. The exercise should increase your heart rate and make you sweat (moderate-intensity  exercise). ? Most adults should also do strengthening exercises at least twice a week. This is in addition to the moderate-intensity exercise.  Maintain a healthy weight  Body mass index (BMI) is a measurement that can be used to identify possible weight problems. It estimates body fat based on height and weight. Your health care provider can help determine your BMI and help you achieve or maintain a healthy weight.  For females 19 years of age and older: ? A BMI below 18.5 is considered underweight. ? A BMI of 18.5 to 24.9 is normal. ? A BMI of 25 to 29.9 is considered overweight. ? A BMI of 30 and above is considered obese.  Watch levels of cholesterol and blood lipids  You should start having your blood tested for lipids and cholesterol at 65 years of age, then have this test every 5 years.  You may need to have your cholesterol levels checked more often if: ? Your lipid or cholesterol levels are high. ? You are older than 65 years of age. ? You are at high risk for heart disease.  Cancer screening Lung Cancer  Lung cancer screening is recommended for adults 31-79 years old who are at high risk for lung cancer because of a history of smoking.  A yearly low-dose CT scan of the lungs is recommended for people who: ? Currently smoke. ? Have quit within the past 15 years. ? Have at least a 30-pack-year history of smoking. A pack year is smoking an average of one pack of cigarettes a day for 1 year.  Yearly screening should continue until it has been 15 years since you quit.  Yearly screening should stop if you develop a health problem that would prevent you from having lung cancer treatment.  Breast Cancer  Practice breast self-awareness. This means understanding how your breasts normally appear and feel.  It also means doing regular breast self-exams. Let your health care provider know about any changes, no matter how small.  If you are in your 20s or 30s, you should have a  clinical breast exam (CBE) by a health care provider every 1-3 years as part of a regular health exam.  If you are 47 or older, have a CBE every year. Also consider having a breast X-ray (mammogram) every year.  If you have a family history of breast cancer, talk to your health care provider about genetic screening.  If you are at high risk for breast cancer, talk to your health care provider about having an MRI and a mammogram every year.  Breast cancer gene (BRCA) assessment is recommended for women who have family members with BRCA-related cancers. BRCA-related cancers include: ? Breast. ? Ovarian. ? Tubal. ? Peritoneal cancers.  Results of the assessment will determine the need for genetic counseling and BRCA1 and BRCA2 testing.  Cervical Cancer Your health care provider may recommend that you be screened regularly for cancer of the pelvic organs (ovaries, uterus, and vagina). This screening involves a pelvic examination, including checking for microscopic changes to the surface of your cervix (Pap  test). You may be encouraged to have this screening done every 3 years, beginning at age 51.  For women ages 51-65, health care providers may recommend pelvic exams and Pap testing every 3 years, or they may recommend the Pap and pelvic exam, combined with testing for human papilloma virus (HPV), every 5 years. Some types of HPV increase your risk of cervical cancer. Testing for HPV may also be done on women of any age with unclear Pap test results.  Other health care providers may not recommend any screening for nonpregnant women who are considered low risk for pelvic cancer and who do not have symptoms. Ask your health care provider if a screening pelvic exam is right for you.  If you have had past treatment for cervical cancer or a condition that could lead to cancer, you need Pap tests and screening for cancer for at least 20 years after your treatment. If Pap tests have been discontinued,  your risk factors (such as having a new sexual partner) need to be reassessed to determine if screening should resume. Some women have medical problems that increase the chance of getting cervical cancer. In these cases, your health care provider may recommend more frequent screening and Pap tests.  Colorectal Cancer  This type of cancer can be detected and often prevented.  Routine colorectal cancer screening usually begins at 65 years of age and continues through 65 years of age.  Your health care provider may recommend screening at an earlier age if you have risk factors for colon cancer.  Your health care provider may also recommend using home test kits to check for hidden blood in the stool.  A small camera at the end of a tube can be used to examine your colon directly (sigmoidoscopy or colonoscopy). This is done to check for the earliest forms of colorectal cancer.  Routine screening usually begins at age 42.  Direct examination of the colon should be repeated every 5-10 years through 65 years of age. However, you may need to be screened more often if early forms of precancerous polyps or small growths are found.  Skin Cancer  Check your skin from head to toe regularly.  Tell your health care provider about any new moles or changes in moles, especially if there is a change in a mole's shape or color.  Also tell your health care provider if you have a mole that is larger than the size of a pencil eraser.  Always use sunscreen. Apply sunscreen liberally and repeatedly throughout the day.  Protect yourself by wearing long sleeves, pants, a wide-brimmed hat, and sunglasses whenever you are outside.  Heart disease, diabetes, and high blood pressure  High blood pressure causes heart disease and increases the risk of stroke. High blood pressure is more likely to develop in: ? People who have blood pressure in the high end of the normal range (130-139/85-89 mm Hg). ? People who are  overweight or obese. ? People who are African American.  If you are 51-32 years of age, have your blood pressure checked every 3-5 years. If you are 37 years of age or older, have your blood pressure checked every year. You should have your blood pressure measured twice-once when you are at a hospital or clinic, and once when you are not at a hospital or clinic. Record the average of the two measurements. To check your blood pressure when you are not at a hospital or clinic, you can use: ? An automated blood  pressure machine at a pharmacy. ? A home blood pressure monitor.  If you are between 61 years and 86 years old, ask your health care provider if you should take aspirin to prevent strokes.  Have regular diabetes screenings. This involves taking a blood sample to check your fasting blood sugar level. ? If you are at a normal weight and have a low risk for diabetes, have this test once every three years after 65 years of age. ? If you are overweight and have a high risk for diabetes, consider being tested at a younger age or more often. Preventing infection Hepatitis B  If you have a higher risk for hepatitis B, you should be screened for this virus. You are considered at high risk for hepatitis B if: ? You were born in a country where hepatitis B is common. Ask your health care provider which countries are considered high risk. ? Your parents were born in a high-risk country, and you have not been immunized against hepatitis B (hepatitis B vaccine). ? You have HIV or AIDS. ? You use needles to inject street drugs. ? You live with someone who has hepatitis B. ? You have had sex with someone who has hepatitis B. ? You get hemodialysis treatment. ? You take certain medicines for conditions, including cancer, organ transplantation, and autoimmune conditions.  Hepatitis C  Blood testing is recommended for: ? Everyone born from [birth year] through 1964. ? Anyone with known risk factors for  hepatitis C.  Sexually transmitted infections (STIs)  You should be screened for sexually transmitted infections (STIs) including gonorrhea and chlamydia if: ? You are sexually active and are younger than 65 years of age. ? You are older than 65 years of age and your health care provider tells you that you are at risk for this type of infection. ? Your sexual activity has changed since you were last screened and you are at an increased risk for chlamydia or gonorrhea. Ask your health care provider if you are at risk.  If you do not have HIV, but are at risk, it may be recommended that you take a prescription medicine daily to prevent HIV infection. This is called pre-exposure prophylaxis (PrEP). You are considered at risk if: ? You are sexually active and do not regularly use condoms or know the HIV status of your partner(s). ? You take drugs by injection. ? You are sexually active with a partner who has HIV.  Talk with your health care provider about whether you are at high risk of being infected with HIV. If you choose to begin PrEP, you should first be tested for HIV. You should then be tested every 3 months for as long as you are taking PrEP. Pregnancy  If you are premenopausal and you may become pregnant, ask your health care provider about preconception counseling.  If you may become pregnant, take 400 to 800 micrograms (mcg) of folic acid every day.  If you want to prevent pregnancy, talk to your health care provider about birth control (contraception). Osteoporosis and menopause  Osteoporosis is a disease in which the bones lose minerals and strength with aging. This can result in serious bone fractures. Your risk for osteoporosis can be identified using a bone density scan.  If you are 17 years of age or older, or if you are at risk for osteoporosis and fractures, ask your health care provider if you should be screened.  Ask your health care provider whether you should take a  calcium or vitamin D supplement to lower your risk for osteoporosis.  Menopause may have certain physical symptoms and risks.  Hormone replacement therapy may reduce some of these symptoms and risks. Talk to your health care provider about whether hormone replacement therapy is right for you. Follow these instructions at home:  Schedule regular health, dental, and eye exams.  Stay current with your immunizations.  Do not use any tobacco products including cigarettes, chewing tobacco, or electronic cigarettes.  If you are pregnant, do not drink alcohol.  If you are breastfeeding, limit how much and how often you drink alcohol.  Limit alcohol intake to no more than 1 drink per day for nonpregnant women. One drink equals 12 ounces of beer, 5 ounces of wine, or 1 ounces of hard liquor.  Do not use street drugs.  Do not share needles.  Ask your health care provider for help if you need support or information about quitting drugs.  Tell your health care provider if you often feel depressed.  Tell your health care provider if you have ever been abused or do not feel safe at home. This information is not intended to replace advice given to you by your health care provider. Make sure you discuss any questions you have with your health care provider. Document Released: 03/04/2011 Document Revised: 01/25/2016 Document Reviewed: 05/23/2015 Elsevier Interactive Patient Education  [employer].

## 2017-06-11 NOTE — Progress Notes (Signed)
Subjective:   [name] [name] is a 66 y.o. female who presents for an Initial Medicare Annual Wellness Visit.   Cardiac Risk Factors include: advanced age (>66men, >56 women);diabetes mellitus;dyslipidemia;hypertension;obesity (BMI >30kg/m2);sedentary lifestyle     Objective:    Today's Vitals   06/11/17 1001  BP: 128/68  Pulse: 79  Temp: 97.9 F (36.6 C)  TempSrc: Oral  SpO2: 99%  Weight: 200 lb (90.7 kg)  Height: 5\' 1"  (1.549 m)  PainSc: 0-No pain   Body mass index is 37.79 kg/m.   Current Medications (verified) Outpatient Encounter Prescriptions as of 06/11/2017  Medication Sig  . ACCU-CHEK FASTCLIX LANCETS MISC TEST BLOOD SUGAR  TWO  TO THREE TIMES DAILY  . acyclovir (ZOVIRAX) 200 MG capsule Take 1 capsule (200 mg total) by mouth 2 (two) times daily.  [name] albuterol (VENTOLIN HFA) 108 (90 Base) MCG/ACT inhaler INHALE 2 PUFFS INTO THE LUNGS EVERY 6  HOURS AS NEEDED FOR WHEEZING OR SHORTNESS OF BREATH.  [name] allopurinol (ZYLOPRIM) 300 MG tablet TAKE 1 TABLET EVERY DAY (Patient taking differently: TAKE 300 mg TABLET EVERY DAY)  . ammonium lactate (LAC-HYDRIN) 12 % lotion Apply 1 application topically 2 (two) times daily as needed for dry skin.  [name] aspirin EC 81 MG tablet Take 81 mg by mouth daily.  [name] atorvastatin (LIPITOR) 40 MG tablet TAKE 1 TABLET EVERY DAY (Patient taking differently: TAKE 40 mg TABLET EVERY DAY)  . azelastine (ASTELIN) 0.1 % nasal spray USE 2 SPRAYS IN EACH NOSTRIL EVERY DAY AS NEEDED AS DIRECTED  . budesonide-formoterol (SYMBICORT) 160-4.5 MCG/ACT inhaler Inhale 2 puffs into the lungs 2 (two) times daily.  . budesonide-formoterol (SYMBICORT) 160-4.5 MCG/ACT inhaler Inhale 2 puffs into the lungs 2 (two) times daily. Rinse, gargle, and spit after use.  [name] buPROPion (WELLBUTRIN) 100 MG tablet Take 1 tablet (100 mg total) by mouth 2 (two) times daily. (Patient taking differently: Take 200 mg by mouth daily. )  . COMBIGAN 0.2-0.5 % ophthalmic solution  Place 1 drop into the left eye every 12 (twelve) hours.   . Dulaglutide (TRULICITY) 1.5 XK/4.8JE SOPN Inject 1.5 mg into the skin once a week.  . esomeprazole (NEXIUM) 40 MG capsule TAKE 1 CAPSULE EVERY DAY (Patient taking differently: TAKE 40 mg CAPSULE EVERY DAY)  . ezetimibe (ZETIA) 10 MG tablet Take 1 tablet (10 mg total) by mouth daily.  . fluticasone (FLONASE) 50 MCG/ACT nasal spray Place 1 spray into both nostrils 2 (two) times daily.  . furosemide (LASIX) 40 MG tablet TAKE 1 TABLET EVERY DAY (Patient taking differently: TAKE 40 mg TABLET EVERY DAY)  . ibuprofen (ADVIL,MOTRIN) 600 MG tablet Take 1 tablet (600 mg total) by mouth every 8 (eight) hours as needed.  . insulin aspart (NOVOLOG FLEXPEN) 100 UNIT/ML FlexPen Inject 50 Units into the skin 2 (two) times daily with a meal.  . Insulin Glargine (LANTUS SOLOSTAR) 100 UNIT/ML Solostar Pen Inject 40 Units into the skin 2 (two) times daily.  [name] latanoprost (XALATAN) 0.005 % ophthalmic solution Place 1 drop into both eyes at bedtime.  [name] LINZESS 290 MCG CAPS capsule Take 290 mcg by mouth daily.   [name] losartan (COZAAR) 50 MG tablet TAKE 1 TABLET EVERY DAY (Patient taking differently: TAKE 50 mg TABLET EVERY DAY)  . metoCLOPramide (REGLAN) 10 MG tablet Take 5 mg by mouth 2 (two) times daily.   . metoprolol succinate (TOPROL-XL) 50 MG 24 hr tablet TAKE 3 TABLETS EVERY DAY  WITH  OR  IMMEDIATELY  FOLLOWING  A MEAL (Patient taking differently: TAKE 150 mg TABLETS EVERY DAY  WITH  OR  IMMEDIATELY  FOLLOWING A MEAL)  . montelukast (SINGULAIR) 10 MG tablet TAKE 1 TABLET EVERY DAY (Patient taking differently: TAKE 10 mg TABLET EVERY DAY)  . Multiple Vitamins-Minerals (MULTIVITAMIN PO) Take 1 tablet by mouth daily.  . ranitidine (ZANTAC) 300 MG tablet Take 1 tablet (300 mg total) by mouth at bedtime.  . sertraline (ZOLOFT) 100 MG tablet Take 1 tablet (100 mg total) by mouth daily. (Patient taking differently: Take 100 mg by mouth at bedtime. )  .  spironolactone (ALDACTONE) 25 MG tablet TAKE 1 TABLET EVERY DAY BEFORE SUPPER  . traMADol (ULTRAM) 50 MG tablet TAKE 1-2 TABLETS BY MOUTH EVERY 8 HOURS AS NEEDED FOR PAIN. MAX 6 TABLETS PER 24 HOURS  . dicyclomine (BENTYL) 20 MG tablet Take 1 tablet (20 mg total) by mouth 2 (two) times daily. For abdominal pain/cramping (Patient not taking: Reported on 06/11/2017)  . EPINEPHrine 0.3 mg/0.3 mL IJ SOAJ injection Inject 0.3 mLs (0.3 mg total) into the muscle once as needed (for allergic reaction).  . nitroGLYCERIN (NITROSTAT) 0.4 MG SL tablet Place 1 tablet (0.4 mg total) under the tongue every 5 (five) minutes as needed for chest pain.   No facility-administered encounter medications on file as of 06/11/2017.     Allergies (verified) Bee venom; Propoxyphene hcl; Amlodipine besylate; Hydrocodone; Lisinopril; Metformin and related; Metronidazole; and Valsartan   History: Past Medical History:  Diagnosis Date  . Allergy   . Angina   . Anxiety   . Arthritis   . Asthma   . Blood transfusion   . Blood transfusion without reported diagnosis   . Cataract   . Chronic cough    Now followed by pulmonary  . Chronic lower back pain   . Complication of anesthesia    "just wake up coughing; that's all"  . Concussion 11/18/11   "fell at dr's office; hit head"  . COPD (chronic obstructive pulmonary disease) ([place])   . Delayed gastric emptying   . Depression   . Diabetes mellitus type II    "take insulin & pills"  . Dyslipidemia   . Exertional dyspnea   . GERD (gastroesophageal reflux disease)   . Glaucoma   . Gout   . Headache(784.0) 11/18/11   "I've had mild headaches the last couple days"  . Headache(784.0) 01/08/12   "pretty constant since 11/18/11's concussion"  . Heart murmur   . History of colonoscopy   . HTN (hypertension)   . Myocardial infarction ([place])   . Osteoporosis   . Peripheral neuropathy   . Pneumonia    "i've had it once" (11/18/11)  . Sleep apnea    on CPAP 12  . Syncope  06/16/2012  . Vocal cord paralysis, unilateral partial    "right"   Past Surgical History:  Procedure Laterality Date  . ANTERIOR CERVICAL DECOMP/DISCECTOMY FUSION  01/15/2012   Procedure: ANTERIOR CERVICAL DECOMPRESSION/DISCECTOMY FUSION 3 LEVELS;  Surgeon: Eustace Moore, MD;  Location: [facility];  Service: Neurosurgery;  Laterality: N/A;  Anterior Cervical Decompression Discectomy Fusion Cerivcal three four, cervical four five, cervical five six, cervical six seven  . BREAST BIOPSY     bilaterally  . BREAST CYST EXCISION     right twice; left once  . BREAST REDUCTION SURGERY  04-21-2003  . BRONCHOSCOPY  07-2007  . CARDIAC CATHETERIZATION  03-02-2004  . CARPAL TUNNEL RELEASE     left  . CATARACT  EXTRACTION  1955; 1979   bilateral; right eye  . CESAREAN SECTION  1987  . COLONOSCOPY W/ BIOPSIES  11/21/2010   adenoma polyp--no hi grade dysplasia Dr Collene Mares  . EYE SURGERY    . KNEE ARTHROSCOPY  08/2000   right  . LEFT AND RIGHT HEART CATHETERIZATION WITH CORONARY ANGIOGRAM N/A 01/19/2014   Procedure: LEFT AND RIGHT HEART CATHETERIZATION WITH CORONARY ANGIOGRAM;  Surgeon: Sinclair Grooms, MD;  Location: [facility];  Service: Cardiovascular;  Laterality: N/A;  . SHOULDER ARTHROSCOPY  08/2000   right  . SHOULDER ARTHROSCOPY W/ ROTATOR CUFF REPAIR  10/18/2003   left  . SPINE SURGERY    . T-score  09/02/04   -0.54 (low risk currently)  . TOTAL ABDOMINAL HYSTERECTOMY  10-07-2000  . TREATMENT FISTULA ANAL    . TUBAL LIGATION  1987   Family History  Problem Relation Age of Onset  . Diabetes Father   . Heart disease Mother   . Hypertension Sister   . Hyperlipidemia Sister   . Hypertension Brother   . Hyperlipidemia Brother   . Congestive Heart Failure Brother   . Hypertension Sister   . Colon cancer Neg Hx   . Depression Neg Hx   . Anxiety disorder Neg Hx    Social History   Occupational History  . unemployed Unemployed   Social History Main Topics  . Smoking status: Former  Smoker    Packs/day: 0.50    Years: 0.50    Types: Cigarettes    Quit date: 09/03/1975  . Smokeless tobacco: Never Used  . Alcohol use No  . Drug use: No  . Sexual activity: No    Tobacco Counseling Patient is former smoker with no plans to restart  Activities of Daily Living In your present state of health, do you have any difficulty performing the following activities: 06/11/2017  Hearing? Y  Vision? Y  Difficulty concentrating or making decisions? N  Walking or climbing stairs? Y  Comment SHOB  Dressing or bathing? N  Doing errands, shopping? N  Preparing Food and eating ? N  Using the Toilet? N  In the past six months, have you accidently leaked urine? Y  Do you have problems with loss of bowel control? N  Managing your Medications? N  Managing your Finances? N  Housekeeping or managing your Housekeeping? N  Some recent data might be hidden   Home Safety:  My home has a working smoke alarm:  Yes X 2           My home throw rugs have been fastened down to the floor or removed:  Removed I have a non-slip surface or non-slip mats in the bathtub and shower:  Non- slip surface and tub transfer bench        All my home's stairs have handrails, including any outdoor stairs  First floor apt without steps        My home's floors, stairs and hallways are free from clutter, wires and cords:  Yes     I have animals in my home  No  I wear seatbelts consistently:  Yes   Immunizations and Health Maintenance Immunization History  Administered Date(s) Administered  . H1N1 08/11/2008  . Influenza Split 05/17/2011, 05/27/2012  . Influenza Whole 05/25/2007, 06/08/2008, 06/02/2009, 06/05/2010  . Influenza,inj,Quad PF,6+ Mos 04/28/2013, 06/01/2014, 05/18/2015, 05/17/2016, 04/30/2017  . Pneumococcal Conjugate-13 10/20/2013  . Pneumococcal Polysaccharide-23 06/02/1993, 06/17/2012  . Td 10/31/1996, 03/02/2008   Health Maintenance Due  Topic Date Due  . Hepatitis C Screening  [birth year]   . HIV Screening  05/08/1967  Hep C and HIV drawn today  Patient Care Team: Dickie La, MD as PCP - General Monna Fam, MD (Ophthalmology) Juanita Craver, MD as Consulting Physician (Gastroenterology) Fay Records, MD as Consulting Physician (Cardiology) Kennith Center, RD as Dietitian (Family Medicine) Deneise Lever, MD as Consulting Physician (Pulmonary Disease) Edrick Kins, DPM as Consulting Physician (Podiatry) Jackalyn Lombard, DMD (Dentistry) Leavy Cella, [facility] (Pharmacist) Neldon Mc Donnamarie Poag, MD as Consulting Physician (Allergy and Immunology)  Indicate any recent Medical Services you may have received from other than [facility] providers in the past year (date may be approximate).     Assessment:   This is a routine wellness examination for [facility].   Hearing/Vision screen  Hearing Screening   Method: Audiometry   125Hz  250Hz  500Hz  1000Hz  2000Hz  3000Hz  4000Hz  6000Hz  8000Hz   Right ear:   40 40 Fail  Fail    Left ear:   40 40 Fail  Fail      Dietary issues and exercise activities discussed: Current Exercise Habits: Home exercise routine, Type of exercise: walking, Time (Minutes): 30, Frequency (Times/Week): 2, Weekly Exercise (Minutes/Week): 60, Intensity: Moderate, Exercise limited by: Other - see comments;respiratory conditions(s) (Hearing and vision loss)  Goals    . Exercise 2x per week (15-30 min per time)    . HEMOGLOBIN A1C < 8    . Weight < 186 lb (84.4 kg) (pt-stated)          7% weight loss     Discussed recording consumption intake to assist with desired 7% weight loss.  Discussed engaging in physical activity 150 min/week  Depression Screen PHQ 2/9 Scores 06/11/2017 04/30/2017 04/09/2017 01/22/2017 10/23/2016 09/11/2016 06/12/2016  PHQ - 2 Score 0 0 0 0 0 0 1    Fall Risk Fall Risk  06/11/2017 04/30/2017 04/09/2017 02/06/2016 07/20/2015  Falls in the past year? Yes Yes Yes Yes No  Number falls in past yr: 2 or more 2 or more 2 or more - -  Comment 3 - - - -   Injury with Fall? No No No Yes -  Comment - - just bumps and bruises - -  Risk Factor Category  High Fall Risk - - - -  Comment PCP notified - - - -  Risk for fall due to : History of fall(s);Impaired vision;Impaired balance/gait - - - Impaired balance/gait;Impaired mobility;Impaired vision  Follow up Falls evaluation completed;Education provided;Falls prevention discussed - - - -   TUG Test:  Done in 29 seconds. Patient used both hands to push out of chair and to sit back down. Walked with cane on right. Falls prevention discussed in detail and literature given. PCP notified of High Fall Risk status.  Cognitive Function: Mini-Cog  Passed with score 3/5  Cognitive Function: MMSE - Mini Mental State Exam 05/27/2012  Orientation to time 5  Orientation to Place 5  Registration 3  Attention/ Calculation 5  Recall 3  Language- name 2 objects 2  Language- repeat 1  Language- follow 3 step command 3  Language- read & follow direction 1  Write a sentence 1  Copy design 1  Total score 30        Screening Tests Health Maintenance  Topic Date Due  . Hepatitis C Screening  12/14/1950  . HIV Screening  05/08/1967  . PNA vac Low Risk Adult (2 of 2 - PPSV23) 06/17/2017  .  FOOT EXAM  09/11/2017  . HEMOGLOBIN A1C  10/10/2017  . TETANUS/TDAP  03/02/2018  . OPHTHALMOLOGY EXAM  05/22/2018  . MAMMOGRAM  08/06/2018  . COLONOSCOPY  03/15/2027  . INFLUENZA VACCINE  Completed  . DEXA SCAN  Completed   Hep C and HIV drawn today  Had eye exam late Sept with Dr. Marshall Cork. ROI signed to obtain record Plan:    Hep C and HIV drawn today Had eye exam late Sept with Dr. Marshall Cork. ROI signed to obtain record   I have personally reviewed and noted the following in the patient's chart:   . Medical and social history . Use of alcohol, tobacco or illicit drugs  . Current medications and supplements . Functional ability and status . Nutritional status . Physical  activity . Advanced directives . List of other physicians . Hospitalizations, surgeries, and ER visits in previous 12 months . Vitals . Screenings to include cognitive, depression, and falls . Referrals and appointments  In addition, I have reviewed and discussed with patient certain preventive protocols, quality metrics, and best practice recommendations. A written personalized care plan for preventive services as well as general preventive health recommendations were provided to patient.     Velora Heckler, RN   06/11/2017

## 2017-06-12 ENCOUNTER — Ambulatory Visit (HOSPITAL_COMMUNITY): Payer: Medicare HMO | Admitting: Psychiatry

## 2017-06-12 LAB — HEPATITIS C ANTIBODY: Hep C Virus Ab: <0.1 s/co ratio (ref 0.0–0.9)

## 2017-06-12 LAB — HIV ANTIBODY (ROUTINE TESTING W REFLEX): HIV Screen 4th Generation wRfx: NONREACTIVE

## 2017-06-16 ENCOUNTER — Encounter: Payer: Self-pay | Admitting: Family Medicine

## 2017-06-18 ENCOUNTER — Ambulatory Visit (INDEPENDENT_AMBULATORY_CARE_PROVIDER_SITE_OTHER): Payer: Medicare HMO | Admitting: Podiatry

## 2017-06-18 VITALS — BP 170/93 | HR 78 | Ht 61.0 in | Wt 200.0 lb

## 2017-06-18 DIAGNOSIS — M79676 Pain in unspecified toe(s): Secondary | ICD-10-CM

## 2017-06-18 DIAGNOSIS — E0842 Diabetes mellitus due to underlying condition with diabetic polyneuropathy: Secondary | ICD-10-CM | POA: Diagnosis not present

## 2017-06-18 DIAGNOSIS — L989 Disorder of the skin and subcutaneous tissue, unspecified: Secondary | ICD-10-CM | POA: Diagnosis not present

## 2017-06-18 DIAGNOSIS — B351 Tinea unguium: Secondary | ICD-10-CM

## 2017-06-18 DIAGNOSIS — M7752 Other enthesopathy of left foot: Secondary | ICD-10-CM

## 2017-06-18 MED ORDER — BETAMETHASONE SOD PHOS & ACET 6 (3-3) MG/ML IJ SUSP: 3.0000 mg | Freq: Once | INTRAMUSCULAR | Status: DC

## 2017-06-19 ENCOUNTER — Ambulatory Visit (INDEPENDENT_AMBULATORY_CARE_PROVIDER_SITE_OTHER): Payer: Medicare HMO | Admitting: Pharmacist

## 2017-06-19 ENCOUNTER — Encounter: Payer: Self-pay | Admitting: Pharmacist

## 2017-06-19 DIAGNOSIS — R079 Chest pain, unspecified: Secondary | ICD-10-CM

## 2017-06-19 DIAGNOSIS — E1149 Type 2 diabetes mellitus with other diabetic neurological complication: Secondary | ICD-10-CM | POA: Diagnosis not present

## 2017-06-19 MED ORDER — NITROGLYCERIN 0.4 MG SL SUBL: 0.4000 mg | SUBLINGUAL_TABLET | SUBLINGUAL | 3 refills | Status: DC | PRN

## 2017-06-19 NOTE — Progress Notes (Signed)
Patient ID: [name] [name], female   DOB: Jun 25, 1951, 66 y.o.   MRN: [id number] Reviewed: Agree with Dr. Graylin Shiver documentation and management.

## 2017-06-19 NOTE — Addendum Note (Signed)
Addended by: Leavy Cella on: 06/19/2017 12:07 PM   Modules accepted: Orders
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
## 2017-06-19 NOTE — Assessment & Plan Note (Signed)
Diabetes longstanding currently uncontrolled with fasting blood glucose >130 mg/dL . Patient denies hypoglycemic events and is able to verbalize appropriate hypoglycemia management plan. Patient denies adherence with medication as she is only getting in 9 shots of insulin per week. Control is suboptimal due to dietary indiscretion, sedentary lifestyle with no interest in an exercise regimen, and suboptimal regimen. Continued basal insulin Lantus (insulin glargine) at 40 units twice daily. Adjusted dose of rapid insulin Novolog (insulin aspart) to 35 units four times daily with meals.  Continued Trulicity (dulaglutide) at 1.5 mg weekly.

## 2017-06-19 NOTE — Patient Instructions (Addendum)
It was nice to see you today!  Continue Lantus (insulin glargine) at 40 units twice daily.   START taking Novolog (insulin aspart) at 35 units four times daily with meals. Make sure that these doses are at least 4 hours apart.  Continue Trulicity (dulaglutide) at 1.5 mg weekly.   Try to get in all of your insulin doses and limit your starch (potatoes, rice, pasta) and soda in your diet .  Come back to see [place] in the pharmacy clinic at the end of November.

## 2017-06-22 NOTE — Progress Notes (Addendum)
Subjective: Patient is a 66 y.o. female to the office today as a new patient with a chief complaint of a painful callus lesion to the plantar aspect of the left foot that has been present for several years. Walking increases the pain. She is also interested in getting new DM shoes. Patient also complains of elongated, thickened nails that cause pain while ambulating in shoes. Patient is unable to trim their own nails. Patient presents today for further treatment and evaluation.   Past Medical History:  Diagnosis Date  . Allergy   . Angina   . Anxiety   . Arthritis   . Asthma   . Blood transfusion   . Blood transfusion without reported diagnosis   . Cataract   . Chronic cough    Now followed by pulmonary  . Chronic lower back pain   . Complication of anesthesia    "just wake up coughing; that's all"  . Concussion 11/18/11   "fell at dr's office; hit head"  . COPD (chronic obstructive pulmonary disease) ([place])   . Delayed gastric emptying   . Depression   . Diabetes mellitus type II    "take insulin & pills"  . Dyslipidemia   . Exertional dyspnea   . GERD (gastroesophageal reflux disease)   . Glaucoma   . Gout   . Headache(784.0) 11/18/11   "I've had mild headaches the last couple days"  . Headache(784.0) 01/08/12   "pretty constant since 11/18/11's concussion"  . Heart murmur   . History of colonoscopy   . HTN (hypertension)   . Myocardial infarction ([place])   . Osteoporosis   . Peripheral neuropathy   . Pneumonia    "i've had it once" (11/18/11)  . Sleep apnea    on CPAP 12  . Syncope 06/16/2012  . Vocal cord paralysis, unilateral partial    "right"    Objective:  Physical Exam General: Alert and oriented x3 in no acute distress  Dermatology: Hyperkeratotic lesion present on the left plantar foot. Pain on palpation with a central nucleated core noted. Skin is warm, dry and supple bilateral lower extremities. Negative for open lesions or macerations. Nails are  tender, long, thickened and dystrophic with subungual debris, consistent with onychomycosis, 1-5 bilateral. No signs of infection noted.  Vascular: Palpable pedal pulses bilaterally. No edema or erythema noted. Capillary refill within normal limits.  Neurological: Epicritic and protective threshold grossly intact bilaterally.   Musculoskeletal Exam: Pain on palpation at the keratotic lesion noted. Range of motion within normal limits bilateral. Muscle strength 5/5 in all groups bilateral.  Assessment: 1. Onychodystrophic nails 1-5 bilateral with hyperkeratosis of nails.  2. Onychomycosis of nail due to dermatophyte bilateral 3. 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 Third MPJ capsulitis left  5. Hammertoe contracture bilateral  6. Pes planus bilateral  Plan of Care:  #1 Patient evaluated. #2 Excisional debridement of keratoic lesion using a chisel blade was performed without incident.  #3 Dressed with light dressing. #4 Mechanical debridement of nails 1-5 bilaterally performed using a nail nipper. Filed with dremel without incident.  #5 Injection of 0.5 mLs Celestone Soluspan injected into the 3rd MPJ of the left foot. #6 return to clinic in 3 months.   Edrick Kins, DPM [facility]  Dr. Edrick Kins, DPM    [address]  [place], [place] [address]                Office [phone]  Fax [phone]   

## 2017-06-23 NOTE — Progress Notes (Signed)
I have reviewed this visit and discussed with Lauren Ducatte, RN, BSN, and agree with her documentation.   

## 2017-06-26 ENCOUNTER — Telehealth: Payer: Self-pay

## 2017-06-26 NOTE — Telephone Encounter (Signed)
Dr. Nori Riis the patient was chosen for jury duty and she wants to be excused from duty because of her disabilities. Please send the letter to the [place].Ozella Almond

## 2017-06-27 ENCOUNTER — Encounter: Payer: Self-pay | Admitting: Family Medicine

## 2017-06-27 NOTE — Telephone Encounter (Signed)
Faxed letter to [phone]

## 2017-07-03 ENCOUNTER — Ambulatory Visit (HOSPITAL_COMMUNITY): Payer: Self-pay | Admitting: Psychiatry

## 2017-07-07 DIAGNOSIS — G4733 Obstructive sleep apnea (adult) (pediatric): Secondary | ICD-10-CM | POA: Diagnosis not present

## 2017-07-09 ENCOUNTER — Other Ambulatory Visit: Payer: Medicare HMO

## 2017-07-18 ENCOUNTER — Other Ambulatory Visit: Payer: Self-pay | Admitting: Family Medicine

## 2017-07-18 DIAGNOSIS — Z1231 Encounter for screening mammogram for malignant neoplasm of breast: Secondary | ICD-10-CM

## 2017-07-21 ENCOUNTER — Other Ambulatory Visit: Payer: Self-pay | Admitting: Family Medicine

## 2017-07-21 DIAGNOSIS — I10 Essential (primary) hypertension: Secondary | ICD-10-CM

## 2017-07-25 ENCOUNTER — Other Ambulatory Visit (HOSPITAL_COMMUNITY): Payer: Self-pay | Admitting: Psychiatry

## 2017-07-25 DIAGNOSIS — F411 Generalized anxiety disorder: Secondary | ICD-10-CM

## 2017-07-25 DIAGNOSIS — F331 Major depressive disorder, recurrent, moderate: Secondary | ICD-10-CM

## 2017-07-31 ENCOUNTER — Encounter: Payer: Self-pay | Admitting: Pharmacist

## 2017-07-31 ENCOUNTER — Ambulatory Visit (INDEPENDENT_AMBULATORY_CARE_PROVIDER_SITE_OTHER): Payer: Medicare HMO | Admitting: Pharmacist

## 2017-07-31 DIAGNOSIS — I10 Essential (primary) hypertension: Secondary | ICD-10-CM | POA: Diagnosis not present

## 2017-07-31 DIAGNOSIS — E1149 Type 2 diabetes mellitus with other diabetic neurological complication: Secondary | ICD-10-CM

## 2017-07-31 LAB — POCT GLYCOSYLATED HEMOGLOBIN (HGB A1C): Hemoglobin A1C: 6.9

## 2017-07-31 MED ORDER — LOSARTAN POTASSIUM 50 MG PO TABS: 100.0000 mg | ORAL_TABLET | Freq: Every day | ORAL | 3 refills | Status: DC

## 2017-07-31 MED ORDER — INSULIN ASPART 100 UNIT/ML FLEXPEN: 35.0000 [IU] | PEN_INJECTOR | Freq: Three times a day (TID) | SUBCUTANEOUS | 5 refills | Status: DC

## 2017-07-31 MED ORDER — INSULIN GLARGINE 100 UNIT/ML SOLOSTAR PEN: 35.0000 [IU] | PEN_INJECTOR | Freq: Two times a day (BID) | SUBCUTANEOUS | 3 refills | Status: DC

## 2017-07-31 NOTE — Assessment & Plan Note (Signed)
Hypertension longstanding previously controlled but recently elevated to SBP 140s, 138/74 in office. Patient reports adherence with medication. No identified reasons for elevated blood pressure. Increase losartan to 100mg  daily.

## 2017-07-31 NOTE — Assessment & Plan Note (Signed)
Diabetes longstanding, improved as evidenced by decreased CBGs and A1c. Patient reports hypoglycemic events and is able to verbalize appropriate hypoglycemia management plan. Patient reports adherence with medication. Control is suboptimal due to dietary indiscretion, sedentary lifestyle. Suspect that improvement in diet is the cause of improved glucose control, but patient's diet changes often. Because of frequency of hypoglycemic events, will decrease Lantus to 35 units BID. Continue Novolog 35 units BID-QID with meals. Continue Trulicity 1.5mg  weekly at this time, as patient has a large supply. Consider switching to Ozempic in the future for additional weight loss. Next A1C anticipated 10/2017.
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
## 2017-07-31 NOTE — Patient Instructions (Addendum)
Thanks for coming to see [place] today!  1. Because you have been seeing frequent low blood sugars, we decreased your Lantus insulin to 35 units twice daily.  2. Because your blood pressure has been rising recently, we increased your losartan to 100mg  daily. You can take two of your losartan 50mg  tablets until you run out, then you can pick up a prescription for the 100mg  tablets. If your blood pressure drops significantly or you are feeling dizzy, contact us for further instruction.  3. Follow up in the pharmacy clinic in ~5-6 weeks.

## 2017-08-13 ENCOUNTER — Ambulatory Visit: Payer: Self-pay | Admitting: Family Medicine

## 2017-08-14 ENCOUNTER — Encounter: Payer: Self-pay | Admitting: Family Medicine

## 2017-08-14 NOTE — Progress Notes (Signed)
Filled out and faxed statement for therapeutic shoes.  D.P.M. was Daylene Katayama.

## 2017-08-20 ENCOUNTER — Ambulatory Visit
Admission: RE | Admit: 2017-08-20 | Discharge: 2017-08-20 | Disposition: A | Discharge status: Home / Self Care | Payer: Medicare HMO | Source: Ambulatory Visit | Attending: Family Medicine | Admitting: Family Medicine

## 2017-08-20 DIAGNOSIS — Z1231 Encounter for screening mammogram for malignant neoplasm of breast: Secondary | ICD-10-CM | POA: Diagnosis not present

## 2017-08-21 ENCOUNTER — Other Ambulatory Visit: Payer: Self-pay | Admitting: Internal Medicine

## 2017-08-27 ENCOUNTER — Other Ambulatory Visit: Payer: Self-pay

## 2017-08-27 ENCOUNTER — Ambulatory Visit (INDEPENDENT_AMBULATORY_CARE_PROVIDER_SITE_OTHER): Payer: Medicare HMO | Admitting: Family Medicine

## 2017-08-27 ENCOUNTER — Encounter: Payer: Self-pay | Admitting: Family Medicine

## 2017-08-27 DIAGNOSIS — E1149 Type 2 diabetes mellitus with other diabetic neurological complication: Secondary | ICD-10-CM

## 2017-08-27 DIAGNOSIS — F411 Generalized anxiety disorder: Secondary | ICD-10-CM

## 2017-08-28 MED ORDER — ESOMEPRAZOLE MAGNESIUM 40 MG PO CPDR: DELAYED_RELEASE_CAPSULE | ORAL | 3 refills | Status: DC

## 2017-08-28 NOTE — Assessment & Plan Note (Signed)
Seems to be doing really well this holiday season no medication changes.  She follows with psychiatry.

## 2017-08-28 NOTE — Assessment & Plan Note (Addendum)
Blood sugars seem much improved.  She is Advertising account executive.  Continue current medication regimen.  She has follow-up with pharmacy clinic in with me as well.  She will call in the interim with any new or worsening symptoms.  She will be due A1c at follow-up.

## 2017-08-28 NOTE — Progress Notes (Signed)
    CHIEF COMPLAINT / HPI: Follow-up diabetes mellitus: Brings her blood sugars.  Lowest is 107.  No episodes of low blood sugar symptoms.  She has been eating pretty well except for Christmas day when she ate more than usual.  Feels like she is lost a little bit weight and is excited about that.  Otherwise she feels like she is doing well.  REVIEW OF SYSTEMS: No chest pain, no shortness of breath with exertion.  No cough.  Energy levels good.  See HPI.  PERTINENT  PMH / PSH: I have reviewed the patient's medications, allergies, past medical and surgical history, smoking status and updated in the EMR as appropriate.   OBJECTIVE:  Vital signs reviewed. GENERAL: Well-developed, well-nourished, no acute distress. CARDIOVASCULAR: Regular rate and rhythm no murmur gallop or rub LUNGS: Clear to auscultation bilaterally, no rales or wheeze. ABDOMEN: Soft positive bowel sounds NEURO: No gross focal neurological deficits except bilateral legal blindness which is baseline with some occasional horizontal nystagmus.  Mild decreased hearing loss.  Stocking glove sensory loss to soft touch, also baseline. MSK: Movement of extremity x 4.  Ambulating with use of cane.  Able to rise from a chair without any assistance. PSYCHIATRIC: Alert and oriented x4.  Very interactive and happy today.  Speech is normal.  Judgment normal, mentation normal.    ASSESSMENT / PLAN: Please see problem oriented charting for details NOTE: Evidently it an emergency department visit back in September somebody reconciled her medications and essentially changed the way the doses were written which did not change the actual dose but really messed up the way that she gets refills so I went in try to fix those today.  There is no medication changes today.  I have reconciled the medication list completely.

## 2017-09-09 ENCOUNTER — Other Ambulatory Visit: Payer: Self-pay | Admitting: Family Medicine

## 2017-09-12 ENCOUNTER — Ambulatory Visit (INDEPENDENT_AMBULATORY_CARE_PROVIDER_SITE_OTHER): Payer: Medicare HMO | Admitting: Internal Medicine

## 2017-09-12 ENCOUNTER — Encounter: Payer: Self-pay | Admitting: Internal Medicine

## 2017-09-12 VITALS — BP 110/60 | HR 72 | Ht 61.0 in | Wt 196.2 lb

## 2017-09-12 DIAGNOSIS — I358 Other nonrheumatic aortic valve disorders: Secondary | ICD-10-CM | POA: Diagnosis not present

## 2017-09-12 DIAGNOSIS — I10 Essential (primary) hypertension: Secondary | ICD-10-CM | POA: Diagnosis not present

## 2017-09-12 DIAGNOSIS — E785 Hyperlipidemia, unspecified: Secondary | ICD-10-CM

## 2017-09-12 DIAGNOSIS — I2510 Atherosclerotic heart disease of native coronary artery without angina pectoris: Secondary | ICD-10-CM

## 2017-09-12 NOTE — Progress Notes (Signed)
Cardiology Office Note   Date:  09/12/2017   ID:  [name] [name], [name] [name] [birth year], MRN [id number]  PCP:  Dickie La, MD  Cardiologist:   Dorris Carnes, MD   F?U of HTN    History of Present Illness: [name] [name] is a 67 y.o. female with a history of HTN and HL and aortic sclerosis  I saw her 1 year ago   Since seen she says her breathing is so so  Gets SOb with walking   No CP   WIll get a funny flittery feeling in chest   Lasts for few seconds   Usually when laying down    Current Meds  Medication Sig  . ACCU-CHEK FASTCLIX LANCETS MISC TEST BLOOD SUGAR  TWO  TO THREE TIMES DAILY  . acyclovir (ZOVIRAX) 200 MG capsule Take 1 capsule (200 mg total) by mouth 2 (two) times daily.  [name] albuterol (VENTOLIN HFA) 108 (90 Base) MCG/ACT inhaler INHALE 2 PUFFS INTO THE LUNGS EVERY 6  HOURS AS NEEDED FOR WHEEZING OR SHORTNESS OF BREATH.  [name] allopurinol (ZYLOPRIM) 300 MG tablet TAKE ONE TABLET BY MOUTH DAILY  . ammonium lactate (LAC-HYDRIN) 12 % lotion Apply 1 application topically 2 (two) times daily as needed for dry skin.  [name] aspirin EC 81 MG tablet Take 81 mg by mouth daily.  [name] atorvastatin (LIPITOR) 40 MG tablet TAKE ONE TABLET EVERY DAY BY MOUTH  . azelastine (ASTELIN) 0.1 % nasal spray USE 2 SPRAYS IN EACH NOSTRIL EVERY DAY AS NEEDED AS DIRECTED  . budesonide-formoterol (SYMBICORT) 160-4.5 MCG/ACT inhaler Inhale 2 puffs into the lungs 2 (two) times daily. Rinse, gargle, and spit after use.  [name] buPROPion (WELLBUTRIN) 100 MG tablet Take 1 tablet (100 mg total) by mouth 2 (two) times daily.  . COMBIGAN 0.2-0.5 % ophthalmic solution Place 1 drop into the left eye every 12 (twelve) hours.   . Dulaglutide (TRULICITY) 1.5 PI/9.5JO SOPN Inject 1.5 mg into the skin once a week.  [name] EPINEPHrine 0.3 mg/0.3 mL IJ SOAJ injection Inject 0.3 mLs (0.3 mg total) into the muscle once as needed (for allergic reaction).  [name] esomeprazole (NEXIUM) 40 MG capsule TAKE 40 mg CAPSULE BID  . ezetimibe  (ZETIA) 10 MG tablet TAKE 1 TABLET BY MOUTH EVERY DAY  . fluticasone (FLONASE) 50 MCG/ACT nasal spray Place 1 spray into both nostrils 2 (two) times daily.  . furosemide (LASIX) 40 MG tablet Take one tablet by mouth daily  . insulin aspart (NOVOLOG FLEXPEN) 100 UNIT/ML FlexPen Inject 35 Units into the skin 4 (four) times daily -  before meals and at bedtime.  . Insulin Glargine (LANTUS SOLOSTAR) 100 UNIT/ML Solostar Pen Inject 35 Units into the skin 2 (two) times daily.  [name] latanoprost (XALATAN) 0.005 % ophthalmic solution Place 1 drop into both eyes at bedtime.  [name] LINZESS 290 MCG CAPS capsule Take 290 mcg by mouth daily.   [name] losartan (COZAAR) 50 MG tablet Take 2 tablets (100 mg total) by mouth daily.  . metoCLOPramide (REGLAN) 5 MG tablet Take 5 mg by mouth 4 (four) times daily.  . metoprolol succinate (TOPROL-XL) 50 MG 24 hr tablet TAKE 3 TABLETS EVERY DAY BY MOUTH  . montelukast (SINGULAIR) 10 MG tablet Take 1 tablet (10 mg total) by mouth daily.  . Multiple Vitamins-Minerals (MULTIVITAMIN PO) Take 1 tablet by mouth daily.  . nitroGLYCERIN (NITROSTAT) 0.4 MG SL tablet Place 1 tablet (0.4 mg total) under the tongue every 5 (five) minutes as needed  for chest pain.  . ranitidine (ZANTAC) 300 MG tablet Take 1 tablet (300 mg total) by mouth at bedtime.  . RESTASIS MULTIDOSE 0.05 % ophthalmic emulsion Place 1 drop into both eyes daily as needed.  . sertraline (ZOLOFT) 100 MG tablet Take 1 tablet (100 mg total) by mouth daily.  [name] spironolactone (ALDACTONE) 25 MG tablet TAKE 1 TABLET EVERY DAY BEFORE SUPPER  . traMADol (ULTRAM) 50 MG tablet TAKE 1-2 TABLETS BY MOUTH EVERY 8 HOURS AS NEEDED FOR PAIN. MAX 6 TABLETS PER 24 HOURS     Allergies:   Bee venom; Propoxyphene hcl; Amlodipine besylate; Hydrocodone; Lisinopril; Metformin and related; Metronidazole; and Valsartan   Past Medical History:  Diagnosis Date  . Allergy   . Angina   . Anxiety   . Arthritis   . Asthma   . Blood transfusion   .  Blood transfusion without reported diagnosis   . Cataract   . Chronic cough    Now followed by pulmonary  . Chronic lower back pain   . Complication of anesthesia    "just wake up coughing; that's all"  . Concussion 11/18/11   "fell at dr's office; hit head"  . COPD (chronic obstructive pulmonary disease) ([place])   . Delayed gastric emptying   . Depression   . Diabetes mellitus type II    "take insulin & pills"  . Dyslipidemia   . Exertional dyspnea   . GERD (gastroesophageal reflux disease)   . Glaucoma   . Gout   . Headache(784.0) 11/18/11   "I've had mild headaches the last couple days"  . Headache(784.0) 01/08/12   "pretty constant since 11/18/11's concussion"  . Heart murmur   . History of colonoscopy   . HTN (hypertension)   . Myocardial infarction ([place])   . Osteoporosis   . Peripheral neuropathy   . Pneumonia    "i've had it once" (11/18/11)  . Sleep apnea    on CPAP 12  . Syncope 06/16/2012  . Vocal cord paralysis, unilateral partial    "right"    Past Surgical History:  Procedure Laterality Date  . ANTERIOR CERVICAL DECOMP/DISCECTOMY FUSION  01/15/2012   Procedure: ANTERIOR CERVICAL DECOMPRESSION/DISCECTOMY FUSION 3 LEVELS;  Surgeon: Eustace Moore, MD;  Location: [facility];  Service: Neurosurgery;  Laterality: N/A;  Anterior Cervical Decompression Discectomy Fusion Cerivcal three four, cervical four five, cervical five six, cervical six seven  . BREAST BIOPSY     bilaterally  . BREAST CYST EXCISION     right twice; left once  . BREAST REDUCTION SURGERY  04-21-2003  . BRONCHOSCOPY  07-2007  . CARDIAC CATHETERIZATION  03-02-2004  . CARPAL TUNNEL RELEASE     left  . CATARACT EXTRACTION  1955; 1979   bilateral; right eye  . CESAREAN SECTION  1987  . COLONOSCOPY W/ BIOPSIES  11/21/2010   adenoma polyp--no hi grade dysplasia Dr Collene Mares  . EYE SURGERY    . KNEE ARTHROSCOPY  08/2000   right  . LEFT AND RIGHT HEART CATHETERIZATION WITH CORONARY ANGIOGRAM N/A 01/19/2014    Procedure: LEFT AND RIGHT HEART CATHETERIZATION WITH CORONARY ANGIOGRAM;  Surgeon: Sinclair Grooms, MD;  Location[facility] CATH LAB;  Service: Cardiovascular;  Laterality: N/A;  . REDUCTION MAMMAPLASTY Bilateral   . SHOULDER ARTHROSCOPY  08/2000   right  . SHOULDER ARTHROSCOPY W/ ROTATOR CUFF REPAIR  10/18/2003   left  . SPINE SURGERY    . T-score  09/02/04   -0.54 (low risk currently)  .  TOTAL ABDOMINAL HYSTERECTOMY  10-07-2000  . TREATMENT FISTULA ANAL    . TUBAL LIGATION  1987     Social History:  The patient  reports that she quit smoking about 42 years ago. Her smoking use included cigarettes. She has a 0.25 pack-year smoking history. she has never used smokeless tobacco. She reports that she does not drink alcohol or use drugs.   Family History:  The patient's family history includes Congestive Heart Failure in her brother; Diabetes in her father; Heart disease in her mother; Hyperlipidemia in her brother and sister; Hypertension in her brother, sister, and sister.    ROS:  Please see the history of present illness. All other systems are reviewed and  Negative to the above problem except as noted.    PHYSICAL EXAM: VS:  BP 110/60   Pulse 72   Ht 5\' 1"  (1.549 m)   Wt 196 lb 3.2 oz (89 kg)   BMI 37.07 kg/m   GEN: Morbidly obese 67 yo , in no acute distress  HEENT: normal  Neck: no JVD, carotid bruits, or masses Cardiac: RRR; Gr II/VI systolic murmur base  , rubs, or gallops,Tr edema  Respiratory:  clear to auscultation bilaterally, normal work of breathing GI: soft, nontender, nondistended, + BS  No hepatomegaly  MS: no deformity Moving all extremities   Skin: warm and dry, no rash Neuro:  Strength and sensation are intact Psych: euthymic mood, full affect   EKG:  EKG is  ordered today.    SR 72      Lipid Panel    Component Value Date/Time   CHOL 121 11/27/2016 1024   TRIG 99 11/27/2016 1024   HDL 28 (L) 11/27/2016 1024   CHOLHDL 4.3 11/27/2016 1024   CHOLHDL 3.7  10/20/2015 1212   VLDL 16 10/20/2015 1212   LDLCALC 73 11/27/2016 1024   LDLDIRECT 104 09/11/2016 1152      Wt Readings from Last 3 Encounters:  09/12/17 196 lb 3.2 oz (89 kg)  08/27/17 193 lb 3.2 oz (87.6 kg)  07/31/17 198 lb (89.8 kg)      ASSESSMENT AND PLAN: 1  HTN  BP is very good   Can take occasional 1 1/2 lasix for LE edema  (may be diet indiscretion)  2  HL Lipids in march 2018  LDL 73  Excellent  Keep on same meds    3  Aortic sclerosis  Echo in Jan 2018   No signif change from previous   4  Minimal CAD  Continue current meds     5  Dyspnea  Stable  I do not think related to heart  Follow  Try to stay active    F/U in 1 year      Current medicines are reviewed at length with the patient today.  The patient does not have concerns regarding medicines.  Signed, Dorris Carnes, MD  09/12/2017 10:49 AM    [address], [place], [place]  [address] Phone: [phone]; Fax: [phone]

## 2017-09-12 NOTE — Patient Instructions (Signed)
Your physician recommends that you continue on your current medications as directed. Please refer to the Current Medication list given to you today. Your physician wants you to follow-up in: 1 year with Dr. Ross.  You will receive a reminder letter in the mail two months in advance. If you don't receive a letter, please call our office to schedule the follow-up appointment.  

## 2017-09-17 ENCOUNTER — Other Ambulatory Visit: Payer: Self-pay | Admitting: Family Medicine

## 2017-09-17 ENCOUNTER — Ambulatory Visit: Payer: Medicare HMO | Admitting: Podiatry

## 2017-09-17 ENCOUNTER — Ambulatory Visit: Payer: Medicare HMO | Admitting: Orthotics

## 2017-09-17 DIAGNOSIS — M7752 Other enthesopathy of left foot: Secondary | ICD-10-CM

## 2017-09-17 NOTE — Progress Notes (Signed)
Shoes had too much slippage in heel.  Will reorder 1/2 size smaller.

## 2017-09-18 ENCOUNTER — Ambulatory Visit (INDEPENDENT_AMBULATORY_CARE_PROVIDER_SITE_OTHER): Payer: Medicare HMO | Admitting: Student

## 2017-09-18 ENCOUNTER — Other Ambulatory Visit: Payer: Self-pay

## 2017-09-18 ENCOUNTER — Encounter: Payer: Self-pay | Admitting: Student

## 2017-09-18 VITALS — BP 124/70 | HR 81 | Temp 98.1°F | Ht 61.0 in | Wt 194.2 lb

## 2017-09-18 DIAGNOSIS — J069 Acute upper respiratory infection, unspecified: Secondary | ICD-10-CM

## 2017-09-18 DIAGNOSIS — B9789 Other viral agents as the cause of diseases classified elsewhere: Secondary | ICD-10-CM

## 2017-09-18 NOTE — Progress Notes (Deleted)
Subjective:    [name] [name] is a 67 y.o. old female here ***  HPI  PMH/Problem List: has HERPES SIMPLEX INFECTION, RECURRENT; Diabetes mellitus type 2 with neurological manifestations ([place]); Hyperlipidemia; Major depressive disorder, recurrent episode ([place]); Obstructive sleep apnea; CARPAL TUNNEL SYNDROME, BILATERAL; Glaucoma; NEURAL HEARING LOSS BILATERAL; HYPERTENSION, BENIGN SYSTEMIC; Unilateral complete paralysis of vocal cord; GERD; GAIT IMBALANCE; Cataract extraction status; Allergic rhinitis due to pollen; DJD (degenerative joint disease) of knee; Hip arthritis, bilateral hip pain; Nail dystrophy; Legally BLIND, bilateral; Acquired cyst of kidney; Incomplete bladder emptying; Fusion of spine of cervical region; Chronic flank pain; Callous ulcer LEFT foot MT area  ([place]); COPD mixed type ([place]); Chronic headaches; Vitamin D deficiency; Bee sting allergy; Major depression, recurrent ([place]); Generalized anxiety disorder; Cough variant asthma; History of wrist fracture, left; Other dysphagia; and Delayed gastric emptying on their problem list.   has a past medical history of Allergy, Angina, Anxiety, Arthritis, Asthma, Blood transfusion, Blood transfusion without reported diagnosis, Cataract, Chronic cough, Chronic lower back pain, Complication of anesthesia, Concussion (11/18/11), COPD (chronic obstructive pulmonary disease) ([place]), Delayed gastric emptying, Depression, Diabetes mellitus type II, Dyslipidemia, Exertional dyspnea, GERD (gastroesophageal reflux disease), Glaucoma, Gout, Headache(784.0) (11/18/11), Headache(784.0) (01/08/12), Heart murmur, History of colonoscopy, HTN (hypertension), Myocardial infarction [facility]), Osteoporosis, Peripheral neuropathy, Pneumonia, Sleep apnea, Syncope (06/16/2012), and Vocal cord paralysis, unilateral partial.  FH:  Family History  Problem Relation Age of Onset  . Diabetes Father   . Heart disease Mother   . Hypertension Sister   . Hyperlipidemia Sister   .  Hypertension Brother   . Hyperlipidemia Brother   . Congestive Heart Failure Brother   . Hypertension Sister   . Colon cancer Neg Hx   . Depression Neg Hx   . Anxiety disorder Neg Hx     SH Social History   Tobacco Use  . Smoking status: Former Smoker    Packs/day: 0.50    Years: 0.50    Pack years: 0.25    Types: Cigarettes    Last attempt to quit: 09/03/1975    Years since quitting: 42.0  . Smokeless tobacco: Never Used  Substance Use Topics  . Alcohol use: No  . Drug use: No    Review of Systems Review of systems negative except for pertinent positives and negatives in history of present illness above.     Objective:     Vitals:   09/18/17 1351  BP: 124/70  Pulse: 81  Temp: 98.1 F (36.7 C)  TempSrc: Oral  SpO2: 98%  Weight: 194 lb 3.2 oz (88.1 kg)  Height: 5\' 1"  (1.549 m)   Body mass index is 36.69 kg/m.  Physical Exam  GEN: appears well***, no apparent distress. Head: normocephalic and atraumatic  Eyes: conjunctiva without injection, sclera anicteric Ears: external ear and ear canal normal Nares: no rhinorrhea, congestion or erythema *** Oropharynx: mmm without erythema or exudation HEM: negative for cervical or periauricular lymphadenopathies CVS: RRR, nl s1 & s2, no murmurs, no edema,  2+ DP & PT pulses bilaterally, cap refills < 2 secs *** RESP: no IWOB, good air movement bilaterally, CTAB GI: BS present & normal, soft, NTND, no guarding, no rebound, no mass GU: no suprapubic or CVA tenderness*** MSK: no focal tenderness or notable swelling SKIN: no apparent skin lesion *** ENDO: negative thyromegally *** NEURO: alert and oiented appropriately, no gross deficits  PSYCH: euthymic mood with congruent affect ***     Assessment and Plan:     No Follow-up on file.  Mercy Riding, MD 09/18/17 Pager: [phone]

## 2017-09-18 NOTE — Patient Instructions (Signed)
It appears that you have a viral upper respiratory infection (Common Cold).  Cold symptoms typically peak at 3-4 days of illness and then gradually improve over 10-14 days. However, a cough may last 3-5 weeks.   -You may get plain Mucinex which could loosen up your cough - Get plenty of rest and adequate hydration. - Consume warm fluids (soup or tea). It relieves stuffy nose, and to loosen phlegm. - Can try saline nasal spray or a Neti Pot for stuffy nose  CONTACT YOUR DOCTOR IF YOU EXPERIENCE ANY OF THE FOLLOWING: - High fever, chest pain, shortness of breath or  not able to keep down food or fluids.  - Cough that gets worse while other cold symptoms improve - Flare up of any chronic lung problem, such as asthma - Your symptoms persist longer than 2 weeks

## 2017-09-18 NOTE — Progress Notes (Signed)
Subjective:    [name] [name] is a 67 y.o. old female here for cough and sore throat  HPI Cough and sore throat: this has been going on for 5 days. Cough is dry. Ear pain bilaterally, mostly at night. Runny nose 5 days ago. Denies shortness of breath, chest pain, fever, nausea, vomiting. Hurts to swallow. No sick contact. Had her flu shot this year.  History of COPD. Good compliance with Symbicort and albuterol.  She is also on Singulair. Denies smoking or drinking EtOH  PMH/Problem List: has HERPES SIMPLEX INFECTION, RECURRENT; Diabetes mellitus type 2 with neurological manifestations ([place]); Hyperlipidemia; Major depressive disorder, recurrent episode ([place]); Obstructive sleep apnea; CARPAL TUNNEL SYNDROME, BILATERAL; Glaucoma; NEURAL HEARING LOSS BILATERAL; HYPERTENSION, BENIGN SYSTEMIC; Unilateral complete paralysis of vocal cord; GERD; GAIT IMBALANCE; Cataract extraction status; Allergic rhinitis due to pollen; DJD (degenerative joint disease) of knee; Hip arthritis, bilateral hip pain; Nail dystrophy; Legally BLIND, bilateral; Acquired cyst of kidney; Incomplete bladder emptying; Fusion of spine of cervical region; Chronic flank pain; Callous ulcer LEFT foot MT area  ([place]); COPD mixed type ([place]); Chronic headaches; Vitamin D deficiency; Bee sting allergy; Major depression, recurrent ([place]); Generalized anxiety disorder; Cough variant asthma; History of wrist fracture, left; Other dysphagia; and Delayed gastric emptying on their problem list.   has a past medical history of Allergy, Angina, Anxiety, Arthritis, Asthma, Blood transfusion, Blood transfusion without reported diagnosis, Cataract, Chronic cough, Chronic lower back pain, Complication of anesthesia, Concussion (11/18/11), COPD (chronic obstructive pulmonary disease) ([place]), Delayed gastric emptying, Depression, Diabetes mellitus type II, Dyslipidemia, Exertional dyspnea, GERD (gastroesophageal reflux disease), Glaucoma, Gout, Headache(784.0)  (11/18/11), Headache(784.0) (01/08/12), Heart murmur, History of colonoscopy, HTN (hypertension), Myocardial infarction [facility]), Osteoporosis, Peripheral neuropathy, Pneumonia, Sleep apnea, Syncope (06/16/2012), and Vocal cord paralysis, unilateral partial.  FH:  Family History  Problem Relation Age of Onset  . Diabetes Father   . Heart disease Mother   . Hypertension Sister   . Hyperlipidemia Sister   . Hypertension Brother   . Hyperlipidemia Brother   . Congestive Heart Failure Brother   . Hypertension Sister   . Colon cancer Neg Hx   . Depression Neg Hx   . Anxiety disorder Neg Hx     SH Social History   Tobacco Use  . Smoking status: Former Smoker    Packs/day: 0.50    Years: 0.50    Pack years: 0.25    Types: Cigarettes    Last attempt to quit: 09/03/1975    Years since quitting: 42.0  . Smokeless tobacco: Never Used  Substance Use Topics  . Alcohol use: No  . Drug use: No    Review of Systems Review of systems negative except for pertinent positives and negatives in history of present illness above.     Objective:     Vitals:   09/18/17 1351  BP: 124/70  Pulse: 81  Temp: 98.1 F (36.7 C)  TempSrc: Oral  SpO2: 98%  Weight: 194 lb 3.2 oz (88.1 kg)  Height: 5\' 1"  (1.549 m)   Body mass index is 36.69 kg/m.  Physical Exam  GEN: appears well, no apparent distress. Head: normocephalic and atraumatic  Eyes: conjunctiva without injection, sclera anicteric Ears: external ear, ear canal and TM normal.  No swelling or skin changes over the mastoid bones. Oropharynx: mmm without erythema or exudation HEM: negative for cervical or periauricular lymphadenopathies CVS: RRR, nl s1 & s2, no murmurs RESP: no IWOB, good air movement bilaterally, CTAB GI: BS present & normal, soft, NTND  SKIN: no apparent skin lesion NEURO: alert and oiented appropriately, no gross deficits  PSYCH: euthymic mood with congruent affect    Assessment and Plan:  1. Viral URI with cough:  history and exam suggestive for viral URTI.  No erythema or exudation to think of strep pharyngitis. Centor's criteria -1.  Unlikely flu without fever or myalgia. Appears well and has no respiratory distress. Lung exam normal.  -Recommended continuing his COPD medication. -Recommended conservative management including rest and adequate hydration -Discussed return precautions including but not limited to shortness of breath or increased working of breathing, severe persistent cough, persistent fever over 101F, not tolerating fluids by mouth or other symptoms concerning to her  Return if symptoms worsen or fail to improve.  Mercy Riding, MD 09/18/17 Pager: [phone]

## 2017-09-22 ENCOUNTER — Encounter: Payer: Self-pay | Admitting: Family Medicine

## 2017-09-22 ENCOUNTER — Ambulatory Visit (INDEPENDENT_AMBULATORY_CARE_PROVIDER_SITE_OTHER): Payer: Medicare HMO | Admitting: Family Medicine

## 2017-09-22 ENCOUNTER — Other Ambulatory Visit: Payer: Self-pay

## 2017-09-22 ENCOUNTER — Other Ambulatory Visit: Payer: Self-pay | Admitting: Allergy and Immunology

## 2017-09-22 DIAGNOSIS — J45991 Cough variant asthma: Secondary | ICD-10-CM

## 2017-09-22 MED ORDER — PREDNISONE 50 MG PO TABS: 50.0000 mg | ORAL_TABLET | Freq: Every day | ORAL | 0 refills | Status: AC

## 2017-09-22 NOTE — Assessment & Plan Note (Addendum)
History of mixed COPD and asthma.  Continues to have a cough after a viral URI that started about 9-10 days ago.  Reassuringly she is afebrile, normal work of breathing, lungs clear to auscultation, O2 99% on room air.  Discussed that her cough is likely lingering after a viral URI however given her respiratory history we can treat for possible asthma/COPD exacerbation.  Seemed more on board with this idea.  Do not feel strongly about starting antibiotics at this point since she is afebrile and well-appearing. -Use albuterol inhaler every 4 hours for the next 24 hours and then as needed -Continue Symbicort -Prednisone 50 mg daily x 5-day course - Daily Flonase use -Follow-up in 1 week

## 2017-09-22 NOTE — Patient Instructions (Signed)
Thank you for coming in today, it was so nice to see you! Today we talked about:    Cough and sore throat: We are going to start you on a daily steroid pill. Take 1 pill a day for 5 day. Use your albuterol inhaler once every 4 hours for the next 24 hours.   Please go to the hospital if your breathing gets worse, you have a fever, or if your albuterol inhaler isnt helping your wheezing/cough.  Please follow up in 1 week. You can schedule this appointment at the front desk before you leave or call the clinic.  Bring in all your medications or supplements to each appointment for review.   If we ordered any tests today, you will be notified via telephone of any abnormalities. If everything is normal you will get a letter in the mail.   If you have any questions or concerns, please do not hesitate to call the office at [phone]. You can also message me directly via MyChart.   Sincerely,  Smitty Cords, MD

## 2017-09-22 NOTE — Progress Notes (Signed)
Subjective:    Patient ID: [name] [name] , female   DOB: Jul 01, 1951 , 67 y.o..   MRN: [id number]  HPI  [name] [name] is a 67 year old female past medical history of asthma/copd, major depression, generalized anxiety disorder, legally blind, hypertension, type 2 diabetes here for  Chief Complaint  Patient presents with  . URI    1. Cough and sore throat: Symptoms started around January 12 (about 9 days ago), her symptoms are about the same. Her symptoms started out with a runny nose and then it went away. She notes she is also having pain in her ears. Admits to difficulty even swallowing due to a burning sensation in her throat. Seen By Dr. Cyndia Skeeters at that time and diagnosed with viral URI.  She is on Symbicort and albuterol which she is compliant with.  She is also taking Singulair. She has tried Mucinex without relief. Has not tried any tylenol or ibuprofen. She is maintaining her hydration. Normal stooling and urination. She denies any fevers, chills, wheezing, dyspnea nausea, vomiting, diarrhea, abdominal pain.   Review of Systems: Per HPI.   Health Maintenance Due  Topic Date Due  . PNA vac Low Risk Adult (2 of 2 - PPSV23) 06/17/2017    Past Medical History: Patient Active Problem List   Diagnosis Date Noted  . Delayed gastric emptying 01/23/2017  . Other dysphagia 01/21/2017  . History of wrist fracture, left 03/06/2016  . Cough variant asthma 06/25/2015  . Major depression, recurrent ([place]) 03/23/2015  . Generalized anxiety disorder 03/23/2015  . Vitamin D deficiency 06/30/2014  . Bee sting allergy 06/30/2014  . Chronic headaches 12/27/2013  . COPD mixed type ([place]) 04/28/2013  . Callous ulcer LEFT foot MT area  ([place]) 01/15/2013  . Chronic flank pain 10/07/2012  . Fusion of spine of cervical region 06/03/2012  . Acquired cyst of kidney 05/28/2012  . Incomplete bladder emptying 05/28/2012  . Legally BLIND, bilateral 02/05/2012  . Nail dystrophy 11/15/2011   . Hip arthritis, bilateral hip pain 05/29/2011  . DJD (degenerative joint disease) of knee 05/01/2011  . Allergic rhinitis due to pollen 01/24/2011  . GAIT IMBALANCE 05/30/2010  . CARPAL TUNNEL SYNDROME, BILATERAL 12/28/2009  . Hyperlipidemia 05/01/2009  . HERPES SIMPLEX INFECTION, RECURRENT 09/14/2008  . NEURAL HEARING LOSS BILATERAL 03/02/2008  . Cataract extraction status 09/10/2007  . Unilateral complete paralysis of vocal cord 02/17/2007  . GERD 12/12/2006  . Diabetes mellitus type 2 with neurological manifestations ([place]) 10/30/2006  . Major depressive disorder, recurrent episode ([place]) 10/30/2006  . Obstructive sleep apnea 10/30/2006  . Glaucoma 10/30/2006  . HYPERTENSION, BENIGN SYSTEMIC 10/30/2006    Medications: reviewed   Social Hx:  reports that she quit smoking about 42 years ago. Her smoking use included cigarettes. She has a 0.25 pack-year smoking history. she has never used smokeless tobacco.   Objective:   BP 110/72   Pulse 74   Temp 98 F (36.7 C) (Oral)   Ht 5\' 1"  (1.549 m)   Wt 190 lb 9.6 oz (86.5 kg)   SpO2 99%   BMI 36.01 kg/m  Physical Exam  Gen: NAD, alert, cooperative with exam, well-appearing HEENT: NCAT, PERRL, clear conjunctiva, oropharynx clear, supple neck, TMs dull bilaterally but no erythema or bulging, poor eyesight Cardiac: Regular rate and rhythm, normal S1/S2, no edema Respiratory: Clear to auscultation bilaterally, no wheezes, non-labored breathing, intermittent cough Psych: good insight, normal mood and affect  Assessment & Plan:  Cough variant asthma History of mixed  COPD and asthma.  Continues to have a cough after a viral URI that started about 9-10 days ago.  Reassuringly she is afebrile, normal work of breathing, lungs clear to auscultation, O2 99% on room air.  Discussed that her cough is likely lingering after a viral URI however given her respiratory history we can treat for possible asthma/COPD exacerbation.  Seemed more on  board with this idea.  Do not feel strongly about starting antibiotics at this point since she is afebrile and well-appearing. -Use albuterol inhaler every 4 hours for the next 24 hours and then as needed -Continue Symbicort -Prednisone 50 mg daily x 5-day course - Daily Flonase use -Follow-up in 1 week  No orders of the defined types were placed in this encounter.  Meds ordered this encounter  Medications  . predniSONE (DELTASONE) 50 MG tablet    Sig: Take 1 tablet (50 mg total) by mouth daily with breakfast for 5 days.    Dispense:  5 tablet    Refill:  0    Smitty Cords, MD [place], PGY-3

## 2017-09-29 ENCOUNTER — Encounter: Payer: Self-pay | Admitting: Family Medicine

## 2017-09-29 ENCOUNTER — Ambulatory Visit (INDEPENDENT_AMBULATORY_CARE_PROVIDER_SITE_OTHER): Payer: Medicare HMO | Admitting: Family Medicine

## 2017-09-29 ENCOUNTER — Other Ambulatory Visit: Payer: Self-pay

## 2017-09-29 DIAGNOSIS — J45991 Cough variant asthma: Secondary | ICD-10-CM

## 2017-09-29 NOTE — Assessment & Plan Note (Addendum)
Her cough has resolved after receiving a 5-day course of prednisone.  She notes that her sore throat has also resolved.  She feels better.  No respiratory symptoms and lungs are clear to auscultation bilaterally. -Continue Symbicort and Singulair daily -Continue albuterol as needed -Daily Flonase - Follow-up with PCP as needed

## 2017-09-29 NOTE — Patient Instructions (Signed)
Thank you for coming in today, it was so nice to see you! Today we talked about:    Cough and sore throat: I'm so glad you are feeling better! Keep doing what you're doing.   Please follow up in the clinic with Dr. Nori Riis    If you have any questions or concerns, please do not hesitate to call the office at [phone]. You can also message me directly via MyChart.   Sincerely,  Smitty Cords, MD

## 2017-09-29 NOTE — Progress Notes (Signed)
Subjective:    Patient ID: [name] [name] , female   DOB: 1950-12-07 , 67 y.o..   MRN: [id number]  HPI  [name] [name] is 67 year old female past medical history of asthma/copd, major depression, generalized anxiety disorder, legally blind, hypertension, type 2 diabetes here for here for  Chief Complaint  Patient presents with  . Follow Up Cough and Sore Throat    1. Follow up for cough and sore throat:  Patient initially had a cough and sore throat that started on January 12.  She was seen on January 21.  She was given a 5-day course of prednisone and instructed to use her albuterol inhaler every 4 hours for the next 24 hours.  She has been using her albuterol inhaler only sparingly.  She is continuing to use her Symbicort as well as Singulair.  She notes that after the prednisone she feels 100% better.  She is eating and drinking normally.  Her cough has resolved.  Her sore throat has also resolved.  She denies any fevers, chills, wheezing, dyspnea, nausea, vomiting, diarrhea, abdominal pain  Review of Systems: Per HPI.   Health Maintenance Due  Topic Date Due  . PNA vac Low Risk Adult (2 of 2 - PPSV23) 06/17/2017    Past Medical History: Patient Active Problem List   Diagnosis Date Noted  . Delayed gastric emptying 01/23/2017  . Other dysphagia 01/21/2017  . History of wrist fracture, left 03/06/2016  . Cough variant asthma 06/25/2015  . Major depression, recurrent ([place]) 03/23/2015  . Generalized anxiety disorder 03/23/2015  . Vitamin D deficiency 06/30/2014  . Bee sting allergy 06/30/2014  . Chronic headaches 12/27/2013  . COPD mixed type ([place]) 04/28/2013  . Callous ulcer LEFT foot MT area  ([place]) 01/15/2013  . Chronic flank pain 10/07/2012  . Fusion of spine of cervical region 06/03/2012  . Acquired cyst of kidney 05/28/2012  . Incomplete bladder emptying 05/28/2012  . Legally BLIND, bilateral 02/05/2012  . Nail dystrophy 11/15/2011  . Hip arthritis,  bilateral hip pain 05/29/2011  . DJD (degenerative joint disease) of knee 05/01/2011  . Allergic rhinitis due to pollen 01/24/2011  . GAIT IMBALANCE 05/30/2010  . CARPAL TUNNEL SYNDROME, BILATERAL 12/28/2009  . Hyperlipidemia 05/01/2009  . HERPES SIMPLEX INFECTION, RECURRENT 09/14/2008  . NEURAL HEARING LOSS BILATERAL 03/02/2008  . Cataract extraction status 09/10/2007  . Unilateral complete paralysis of vocal cord 02/17/2007  . GERD 12/12/2006  . Diabetes mellitus type 2 with neurological manifestations () 10/30/2006  . Major depressive disorder, recurrent episode ([place]) 10/30/2006  . Obstructive sleep apnea 10/30/2006  . Glaucoma 10/30/2006  . HYPERTENSION, BENIGN SYSTEMIC 10/30/2006    Medications: reviewed and updated  Social Hx:  reports that she quit smoking about 42 years ago. Her smoking use included cigarettes. She has a 0.25 pack-year smoking history. she has never used smokeless tobacco.   Objective:   BP 112/60   Pulse 76   Temp 98.2 F (36.8 C) (Oral)   Ht 5\' 1"  (1.549 m)   Wt 196 lb 9.6 oz (89.2 kg)   SpO2 99%   BMI 37.15 kg/m  Physical Exam  Gen: NAD, alert, cooperative with exam, well-appearing HEENT: NCAT, PERRL, clear conjunctiva, oropharynx clear, supple neck Cardiac: Regular rate and rhythm Respiratory: Clear to auscultation bilaterally, no wheezes, non-labored breathing Psych: good insight, normal mood and affect  Assessment & Plan:  Cough variant asthma Her cough has resolved after receiving a 5-day course of prednisone.  She notes that  her sore throat has also resolved.  She feels better.  No respiratory symptoms and lungs are clear to auscultation bilaterally. -Continue Symbicort and Singulair daily -Continue albuterol as needed -Daily Flonase - Follow-up with PCP as needed   Smitty Cords, MD [place], PGY-3

## 2017-10-01 ENCOUNTER — Other Ambulatory Visit: Payer: Self-pay | Admitting: Family Medicine

## 2017-10-02 ENCOUNTER — Ambulatory Visit (INDEPENDENT_AMBULATORY_CARE_PROVIDER_SITE_OTHER): Payer: Medicare HMO | Admitting: Orthotics

## 2017-10-02 DIAGNOSIS — L989 Disorder of the skin and subcutaneous tissue, unspecified: Secondary | ICD-10-CM | POA: Diagnosis not present

## 2017-10-02 DIAGNOSIS — E0842 Diabetes mellitus due to underlying condition with diabetic polyneuropathy: Secondary | ICD-10-CM | POA: Diagnosis not present

## 2017-10-02 DIAGNOSIS — M7752 Other enthesopathy of left foot: Secondary | ICD-10-CM | POA: Diagnosis not present

## 2017-10-02 DIAGNOSIS — M7750 Other enthesopathy of unspecified foot: Secondary | ICD-10-CM | POA: Diagnosis not present

## 2017-10-02 DIAGNOSIS — M7751 Other enthesopathy of right foot: Secondary | ICD-10-CM

## 2017-10-02 NOTE — Progress Notes (Signed)

## 2017-10-07 ENCOUNTER — Other Ambulatory Visit: Payer: Self-pay | Admitting: Family Medicine

## 2017-11-12 ENCOUNTER — Other Ambulatory Visit: Payer: Self-pay

## 2017-11-12 ENCOUNTER — Ambulatory Visit (HOSPITAL_COMMUNITY)
Admission: RE | Admit: 2017-11-12 | Discharge: 2017-11-12 | Disposition: A | Discharge status: Home / Self Care | Payer: Medicare HMO | Source: Ambulatory Visit | Attending: Family Medicine | Admitting: Family Medicine

## 2017-11-12 ENCOUNTER — Ambulatory Visit (INDEPENDENT_AMBULATORY_CARE_PROVIDER_SITE_OTHER): Payer: Medicare HMO | Admitting: Family Medicine

## 2017-11-12 ENCOUNTER — Encounter: Payer: Self-pay | Admitting: Family Medicine

## 2017-11-12 VITALS — BP 130/78 | HR 68 | Temp 98.0°F | Ht 64.0 in | Wt 196.4 lb

## 2017-11-12 DIAGNOSIS — M25511 Pain in right shoulder: Secondary | ICD-10-CM

## 2017-11-12 DIAGNOSIS — R109 Unspecified abdominal pain: Secondary | ICD-10-CM | POA: Diagnosis not present

## 2017-11-12 DIAGNOSIS — G8929 Other chronic pain: Secondary | ICD-10-CM | POA: Diagnosis not present

## 2017-11-12 DIAGNOSIS — K30 Functional dyspepsia: Secondary | ICD-10-CM

## 2017-11-12 DIAGNOSIS — H543 Unqualified visual loss, both eyes: Secondary | ICD-10-CM

## 2017-11-12 DIAGNOSIS — M85811 Other specified disorders of bone density and structure, right shoulder: Secondary | ICD-10-CM | POA: Insufficient documentation

## 2017-11-12 DIAGNOSIS — E1149 Type 2 diabetes mellitus with other diabetic neurological complication: Secondary | ICD-10-CM | POA: Diagnosis not present

## 2017-11-12 LAB — POCT GLYCOSYLATED HEMOGLOBIN (HGB A1C): Hemoglobin A1C: 6.6

## 2017-11-12 NOTE — Patient Instructions (Signed)
Current Outpatient Medications on File Prior to Visit  Medication Sig Dispense Refill  . ACCU-CHEK FASTCLIX LANCETS MISC TEST THREE TIMES DAILY 306 each 12  . acyclovir (ZOVIRAX) 200 MG capsule Take 1 capsule (200 mg total) by mouth 2 (two) times daily. 120 capsule 5  . albuterol (VENTOLIN HFA) 108 (90 Base) MCG/ACT inhaler INHALE 2 PUFFS INTO THE LUNGS EVERY 6  HOURS AS NEEDED FOR WHEEZING OR SHORTNESS OF BREATH. 54 g 3  . allopurinol (ZYLOPRIM) 300 MG tablet TAKE ONE TABLET BY MOUTH DAILY 90 tablet 3  . ammonium lactate (LAC-HYDRIN) 12 % lotion Apply 1 application topically 2 (two) times daily as needed for dry skin. 400 g 12  . aspirin EC 81 MG tablet Take 81 mg by mouth daily.    [name] atorvastatin (LIPITOR) 40 MG tablet TAKE 1 TABLET EVERY DAY 90 tablet 3  . azelastine (ASTELIN) 0.1 % nasal spray USE 2 SPRAYS IN EACH NOSTRIL EVERY DAY AS NEEDED AS DIRECTED 60 mL 3  . buPROPion (WELLBUTRIN) 100 MG tablet Take 1 tablet (100 mg total) by mouth 2 (two) times daily. 180 tablet 0  . COMBIGAN 0.2-0.5 % ophthalmic solution Place 1 drop into the left eye every 12 (twelve) hours.     . Dulaglutide (TRULICITY) 1.5 XB/1.4NW SOPN Inject 1.5 mg into the skin once a week. 4 pen 3  . EPINEPHrine 0.3 mg/0.3 mL IJ SOAJ injection Inject 0.3 mLs (0.3 mg total) into the muscle once as needed (for allergic reaction). 1 Device 3  . esomeprazole (NEXIUM) 40 MG capsule TAKE 1 CAPSULE EVERY DAY 90 capsule 3  . ezetimibe (ZETIA) 10 MG tablet TAKE 1 TABLET BY MOUTH EVERY DAY 90 tablet 0  . fluticasone (FLONASE) 50 MCG/ACT nasal spray USE 1 SPRAY IN EACH NOSTRIL TWICE DAILY 48 g 0  . furosemide (LASIX) 40 MG tablet Take one tablet by mouth daily 90 tablet 3  . insulin aspart (NOVOLOG FLEXPEN) 100 UNIT/ML FlexPen Inject 35 Units into the skin 4 (four) times daily -  before meals and at bedtime. 60 mL 5  . Insulin Glargine (LANTUS SOLOSTAR) 100 UNIT/ML Solostar Pen Inject 35 Units into the skin 2 (two) times daily. 60 mL 3  .  latanoprost (XALATAN) 0.005 % ophthalmic solution Place 1 drop into both eyes at bedtime.    [name] LINZESS 290 MCG CAPS capsule Take 290 mcg by mouth daily.     [name] losartan (COZAAR) 50 MG tablet Take 2 tablets (100 mg total) by mouth daily. 90 tablet 3  . metoCLOPramide (REGLAN) 5 MG tablet Take 5 mg by mouth 4 (four) times daily.  10  . metoprolol succinate (TOPROL-XL) 50 MG 24 hr tablet TAKE 3 TABLETS EVERY DAY WITH OR IMMEDIATELY FOLLOWING A MEAL 270 tablet 3  . montelukast (SINGULAIR) 10 MG tablet TAKE 1 TABLET EVERY DAY 90 tablet 3  . Multiple Vitamins-Minerals (MULTIVITAMIN PO) Take 1 tablet by mouth daily.    . nitroGLYCERIN (NITROSTAT) 0.4 MG SL tablet Place 1 tablet (0.4 mg total) under the tongue every 5 (five) minutes as needed for chest pain. 50 tablet 3  . ranitidine (ZANTAC) 300 MG tablet Take 1 tablet (300 mg total) by mouth at bedtime. 90 tablet 1  . RESTASIS MULTIDOSE 0.05 % ophthalmic emulsion Place 1 drop into both eyes daily as needed.  3  . sertraline (ZOLOFT) 100 MG tablet Take 1 tablet (100 mg total) by mouth daily. 90 tablet 0  . spironolactone (ALDACTONE) 25 MG tablet TAKE  1 TABLET EVERY DAY BEFORE SUPPER 90 tablet 3  . SYMBICORT 160-4.5 MCG/ACT inhaler INHALE 2 PUFFS INTO THE LUNGS 2 (TWO) TIMES DAILY. RINSE, GARGLE, AND SPIT AFTER USE. 3 Inhaler 0  . traMADol (ULTRAM) 50 MG tablet TAKE 1-2 TABLETS BY MOUTH EVERY 8 HOURS AS NEEDED FOR PAIN. MAX 6 TABLETS PER 24 HOURS 180 tablet 5   No current facility-administered medications on file prior to visit.    Allergies  Allergen Reactions  . Bee Venom Hives and Swelling  . Propoxyphene Hcl Itching  . Amlodipine Besylate Swelling  . Hydrocodone Other (See Comments)    With Vicodin - makes patient "jittery", and "hangover effect - sleepy next day"  . Lisinopril Cough    Changed to ARB  . Metformin And Related Diarrhea    GI distress  . Metronidazole Other (See Comments)    REACTION: "just didn't work; my body never did heal  from it"  . Valsartan Other (See Comments)    REACTION:  "sleep more the next day after I took it; it made me real tired"

## 2017-11-13 ENCOUNTER — Encounter: Payer: Self-pay | Admitting: Family Medicine

## 2017-11-13 NOTE — Progress Notes (Addendum)
    CHIEF COMPLAINT / HPI: #32follow-up diabetes mellitus.  No episodes of low blood sugar.  No increased urinary frequency.  Energy level has been stable.  Has been taking her medicines regularly without problem.  No specific exercise program. #2.  Needs a form filled out for camp [place] [employer].  That is for July. #3.  Intermittent abdominal pain mostly left-sided left lower quadrant.  Crampy.  Last several minutes.  Seems to be associated with bowel movement.  Gets worse if she has a lot of gas.  No blood in her stool.  No change in stool caliber.  Outside of these episodes of crampy pain, if she does not have any other abdominal upset or discomfort.  Does not radiate.  No heartburn. #4.  Increasing problems with right shoulder pain.  About 20 years ago she had arthroscopy.  Feels like she is having similar symptoms when she had "spurs" on her shoulder.  Pain with abduction greater than shoulder height.  She would like to consider injection therapy but also wants to have x-ray done because if the injection does not help she would consider surgical evaluation and management.  REVIEW OF SYSTEMS: See HPI  PERTINENT  PMH / PSH: I have reviewed the patient's medications, allergies, past medical and surgical history, smoking status and updated in the EMR as appropriate.   OBJECTIVE:  Vital signs reviewed. GENERAL: Well-developed, well-nourished, no acute distress. CARDIOVASCULAR: Regular rate and rhythm no murmur gallop or rub LUNGS: Clear to auscultation bilaterally, no rales or wheeze. ABDOMEN: Soft positive bowel sounds.  No masses noted although this exam is limited somewhat by habitus.  There is no rebound or guarding.  The area she points to 4 her episodic crampy pain is in the left lower quadrant.  I cannot reproduce her pain. NEURO: Legally blind bilaterally.  Resting nystagmus.  Decreased sensation soft touch bilateral feet, bilateral hands, stocking glove pattern. MSK: Movement of extremity x 4.   Right shoulder pain with abduction above 90 degrees but has full range of motion and intact strength in all planes of the rotator cuff.  PROCEDURE: INJECTION: Patient was given informed consent, signed copy in the chart. Appropriate time out was taken. Area prepped and draped in usual sterile fashion. Ethyl chloride was  used for local anesthesia. A 21 gauge 1 1/2 inch needle was used.. 1 cc of methylprednisolone 40 mg/ml plus 4 cc of 1% lidocaine without epinephrine was injected into the right subacromial bursa using a(n) posterior approach.   The patient tolerated the procedure well. There were no complications. Post procedure instructions were given.    ASSESSMENT / PLAN: Her shoulder pain we did an injection today.  She will follow-up in 4-6 weeks if she is not improving.  We will also get x-ray at her request.  I suspect she has significant arthritis in the glenohumeral joint.   Please see problem oriented charting for details

## 2017-11-13 NOTE — Assessment & Plan Note (Signed)
Filled out her paperwork for camp [place].

## 2017-11-13 NOTE — Assessment & Plan Note (Signed)
A1c is a very good level today.  Continue current medication regimen.

## 2017-11-13 NOTE — Assessment & Plan Note (Signed)
With chronic constipation which I think is contributing to her intermittent crampy abdominal left lower quadrant pain.

## 2017-11-13 NOTE — Assessment & Plan Note (Signed)
She has history of chronic bilateral intermittent flank pain/abdominal pain.  Where she is pointing to today seems to be related to left lower quadrant colonic pain, probably related to her long history of constipation.  I asked her if she wanted to see the gastroenterologist and perhaps consider repeat colonoscopy and she does not want to  pursue that right now.  She will let me know if she has any new or worsening symptoms.  She has had no weight loss and  her abdominal exam is normal.

## 2017-11-17 ENCOUNTER — Other Ambulatory Visit: Payer: Self-pay | Admitting: Internal Medicine

## 2017-11-18 ENCOUNTER — Telehealth: Payer: Self-pay

## 2017-11-18 NOTE — Telephone Encounter (Signed)
Pt calling to speak to MD regarding x-rays. Call back number [phone] Wallace Cullens, RN

## 2017-11-19 ENCOUNTER — Other Ambulatory Visit: Payer: Self-pay | Admitting: Family Medicine

## 2017-11-20 ENCOUNTER — Telehealth: Payer: Self-pay

## 2017-11-20 ENCOUNTER — Ambulatory Visit (HOSPITAL_COMMUNITY): Payer: Medicare HMO | Admitting: Psychiatry

## 2017-11-20 DIAGNOSIS — E1149 Type 2 diabetes mellitus with other diabetic neurological complication: Secondary | ICD-10-CM

## 2017-11-20 MED ORDER — INSULIN ASPART 100 UNIT/ML FLEXPEN: 25.0000 [IU] | PEN_INJECTOR | Freq: Three times a day (TID) | SUBCUTANEOUS | 5 refills | Status: DC

## 2017-11-20 NOTE — Telephone Encounter (Signed)
Patient called nurse line concerned about low blood sugar.  FBS this morning was 58. States took 35u of Lantus and 35u of Novolog last night at bedtime because she ate something sweet. Also stated she does not always take the Novolog at bedtime depending on what she hs eaten.  Blood sugar reading of 68 at 5 pm yesterday and 60 the day before.   Thinks her insulin may need to be adjusted. She feels okay and drank some hot chocolate.  Danley Danker, RN [facility] RN)

## 2017-11-20 NOTE — Telephone Encounter (Signed)
Continue lantus as is Decrease Novologue to 25 units 4x per day.  Skip nighttime novologue if she does have an evening snack.

## 2017-11-20 NOTE — Telephone Encounter (Signed)
Patient aware. She wrote directions down and read it back to me. Dr Andria Frames VO was to skip Novolog if does NOT have evening snack so this was what was told to her, along with other changes. She will let us know if sugar stays low. Danley Danker, RN [facility] RN)

## 2017-11-20 NOTE — Telephone Encounter (Signed)
Spoke with patient again. She ate some banana pudding around noon and blood sugar at 2:05 was 121. She does not want to take any insulin without someone checking this note. Will speak to preceptor.  Danley Danker, RN [facility] RN)

## 2017-11-21 NOTE — Telephone Encounter (Signed)
Pt informed of below. Zimmerman Rumple, Kailin Principato D, CMA  

## 2017-11-21 NOTE — Telephone Encounter (Signed)
X ray shows some arthritis butotherwise nothing we did not expect THANKS! [name]

## 2017-11-24 ENCOUNTER — Other Ambulatory Visit: Payer: Self-pay | Admitting: Allergy and Immunology

## 2017-11-24 ENCOUNTER — Telehealth: Payer: Self-pay

## 2017-11-24 NOTE — Telephone Encounter (Signed)
Pt calling again today regarding blood sugars. States this morning at 8:03 her blood sugar was 95. Rechecked at 2:11 and was 67. She states she ate "sometime between the 2 checks" but unsure when. Will forward to MD for further review. See below conversations. Wallace Cullens, RN

## 2017-11-25 NOTE — Telephone Encounter (Signed)
Dear [name] Team Have her cut her evening dose of lantus from 35 to 30 and if she is not eating a snack at night do not use the Novolog at night. Call back if she continues to have low AM sugars [name]'

## 2017-11-25 NOTE — Telephone Encounter (Signed)
Created in error

## 2017-11-25 NOTE — Telephone Encounter (Signed)
Pt informed of below. Zimmerman Rumple, April D, CMA  

## 2017-11-25 NOTE — Telephone Encounter (Signed)
Pt left message on nurse line calling about her blood sugars wanting MD advice on what to do next. Pt's call back [phone] Wallace Cullens, RN

## 2017-11-28 ENCOUNTER — Other Ambulatory Visit: Payer: Self-pay | Admitting: *Deleted

## 2017-12-01 MED ORDER — BUDESONIDE-FORMOTEROL FUMARATE 160-4.5 MCG/ACT IN AERO: INHALATION_SPRAY | RESPIRATORY_TRACT | 3 refills | Status: DC

## 2017-12-01 MED ORDER — FLUTICASONE PROPIONATE 50 MCG/ACT NA SUSP: 1.0000 | Freq: Two times a day (BID) | NASAL | 3 refills | Status: DC

## 2017-12-04 ENCOUNTER — Other Ambulatory Visit: Payer: Self-pay | Admitting: Internal Medicine

## 2017-12-04 MED ORDER — EZETIMIBE 10 MG PO TABS: 10.0000 mg | ORAL_TABLET | Freq: Every day | ORAL | 2 refills | Status: DC

## 2017-12-10 DIAGNOSIS — H04123 Dry eye syndrome of bilateral lacrimal glands: Secondary | ICD-10-CM | POA: Diagnosis not present

## 2017-12-10 DIAGNOSIS — H401133 Primary open-angle glaucoma, bilateral, severe stage: Secondary | ICD-10-CM | POA: Diagnosis not present

## 2017-12-10 DIAGNOSIS — M3501 Sicca syndrome with keratoconjunctivitis: Secondary | ICD-10-CM | POA: Diagnosis not present

## 2017-12-17 ENCOUNTER — Other Ambulatory Visit: Payer: Self-pay | Admitting: Family Medicine

## 2017-12-23 ENCOUNTER — Other Ambulatory Visit (HOSPITAL_COMMUNITY): Payer: Self-pay | Admitting: Psychiatry

## 2017-12-23 ENCOUNTER — Other Ambulatory Visit: Payer: Self-pay | Admitting: Allergy and Immunology

## 2017-12-23 DIAGNOSIS — F411 Generalized anxiety disorder: Secondary | ICD-10-CM

## 2017-12-30 ENCOUNTER — Telehealth: Payer: Self-pay | Admitting: Allergy and Immunology

## 2017-12-30 ENCOUNTER — Other Ambulatory Visit: Payer: Self-pay

## 2017-12-30 MED ORDER — RANITIDINE HCL 300 MG PO TABS: 300.0000 mg | ORAL_TABLET | Freq: Every day | ORAL | 5 refills | Status: DC

## 2017-12-30 NOTE — Telephone Encounter (Signed)
RX for Ranitidine sent to [facility] [address].

## 2017-12-30 NOTE — Telephone Encounter (Signed)
[employer] called requesting a refill be sent to a local pharmacy for quicker pick up, for ranitidine 300mg . Please send to [facility] [address].

## 2018-01-10 ENCOUNTER — Ambulatory Visit (INDEPENDENT_AMBULATORY_CARE_PROVIDER_SITE_OTHER): Payer: Medicare HMO | Admitting: Psychiatry

## 2018-01-10 VITALS — BP 128/80 | HR 80 | Ht 61.0 in | Wt 195.0 lb

## 2018-01-10 DIAGNOSIS — Z87891 Personal history of nicotine dependence: Secondary | ICD-10-CM

## 2018-01-10 DIAGNOSIS — Z560 Unemployment, unspecified: Secondary | ICD-10-CM | POA: Diagnosis not present

## 2018-01-10 DIAGNOSIS — F330 Major depressive disorder, recurrent, mild: Secondary | ICD-10-CM

## 2018-01-10 DIAGNOSIS — F411 Generalized anxiety disorder: Secondary | ICD-10-CM | POA: Diagnosis not present

## 2018-01-10 MED ORDER — BUPROPION HCL 100 MG PO TABS: 100.0000 mg | ORAL_TABLET | Freq: Two times a day (BID) | ORAL | 0 refills | Status: DC

## 2018-01-10 MED ORDER — SERTRALINE HCL 50 MG PO TABS: 50.0000 mg | ORAL_TABLET | Freq: Every day | ORAL | 0 refills | Status: DC

## 2018-01-10 NOTE — Progress Notes (Signed)
[place] MD/PA/NP OP Progress Note  01/10/2018 3:00 PM [name] [name]  MRN:  [id number]  Chief Complaint:  Chief Complaint    Follow-up     HPI: Patient reports she is doing well.  She denies depression.  She denies isolation and anhedonia.  She denies hopelessness.  She denies SI/HI.  Patient states that she volunteers 2 days a week at her local church.  Other days when she does not have doctor's appointment she likes to go window shopping and eat out.  Her son has recently increased his contact with her which makes the patient feel very happy.  Sleep is good most nights but she does have occasional worries that keep her up.  She reports that her sister has throat cancer and is very sick.  Her sister's health worries her a lot.  Patient is taking her medication as prescribed and denies side effects.  She states that she is ready to come off the Zoloft but is agreeable to decreasing the dose and monitoring for now.  Visit Diagnosis:    ICD-10-CM   1. Mild episode of recurrent major depressive disorder (HCC) F33.0 buPROPion (WELLBUTRIN) 100 MG tablet    sertraline (ZOLOFT) 50 MG tablet  2. GAD (generalized anxiety disorder) F41.1 buPROPion (WELLBUTRIN) 100 MG tablet    sertraline (ZOLOFT) 50 MG tablet    Past Psychiatric History: Anxiety:Yes Bipolar Disorder:No Depression:Yes Mania:No Psychosis:No Schizophrenia:No Personality Disorder:No Hospitalization for psychiatric illness:No History of Electroconvulsive Shock Therapy:No Prior Suicide Attempts:No    Past Medical History:  Past Medical History:  Diagnosis Date  . Allergy   . Angina   . Anxiety   . Arthritis   . Asthma   . Blood transfusion   . Blood transfusion without reported diagnosis   . Cataract   . Chronic cough    Now followed by pulmonary  . Chronic lower back pain   . Complication of anesthesia    "just wake up coughing; that's all"  . Concussion 11/18/11   "fell at dr's office; hit head"  .  COPD (chronic obstructive pulmonary disease) ([place])   . Delayed gastric emptying   . Depression   . Diabetes mellitus type II    "take insulin & pills"  . Dyslipidemia   . Exertional dyspnea   . GERD (gastroesophageal reflux disease)   . Glaucoma   . Gout   . Headache(784.0) 11/18/11   "I've had mild headaches the last couple days"  . Headache(784.0) 01/08/12   "pretty constant since 11/18/11's concussion"  . Heart murmur   . History of colonoscopy   . HTN (hypertension)   . Myocardial infarction ([place])   . Osteoporosis   . Peripheral neuropathy   . Pneumonia    "i've had it once" (11/18/11)  . Sleep apnea    on CPAP 12  . Syncope 06/16/2012  . Vocal cord paralysis, unilateral partial    "right"    Past Surgical History:  Procedure Laterality Date  . ANTERIOR CERVICAL DECOMP/DISCECTOMY FUSION  01/15/2012   Procedure: ANTERIOR CERVICAL DECOMPRESSION/DISCECTOMY FUSION 3 LEVELS;  Surgeon: Eustace Moore, MD;  Location: [place] NEURO ORS;  Service: Neurosurgery;  Laterality: N/A;  Anterior Cervical Decompression Discectomy Fusion Cerivcal three four, cervical four five, cervical five six, cervical six seven  . BREAST BIOPSY     bilaterally  . BREAST CYST EXCISION     right twice; left once  . BREAST REDUCTION SURGERY  04-21-2003  . BRONCHOSCOPY  07-2007  . CARDIAC CATHETERIZATION  03-02-2004  . CARPAL TUNNEL RELEASE     left  . CATARACT EXTRACTION  1955; 1979   bilateral; right eye  . CESAREAN SECTION  1987  . COLONOSCOPY W/ BIOPSIES  11/21/2010   adenoma polyp--no hi grade dysplasia Dr Collene Mares  . EYE SURGERY    . KNEE ARTHROSCOPY  08/2000   right  . LEFT AND RIGHT HEART CATHETERIZATION WITH CORONARY ANGIOGRAM N/A 01/19/2014   Procedure: LEFT AND RIGHT HEART CATHETERIZATION WITH CORONARY ANGIOGRAM;  Surgeon: Sinclair Grooms, MD;  Location: [facility] CATH LAB;  Service: Cardiovascular;  Laterality: N/A;  . REDUCTION MAMMAPLASTY Bilateral   . SHOULDER ARTHROSCOPY  08/2000   right  . SHOULDER  ARTHROSCOPY W/ ROTATOR CUFF REPAIR  10/18/2003   left  . SPINE SURGERY    . T-score  09/02/04   -0.54 (low risk currently)  . TOTAL ABDOMINAL HYSTERECTOMY  10-07-2000  . TREATMENT FISTULA ANAL    . TUBAL LIGATION  1987    Family Psychiatric History:  Family History  Problem Relation Age of Onset  . Diabetes Father   . Heart disease Mother   . Hypertension Sister   . Hyperlipidemia Sister   . Hypertension Brother   . Hyperlipidemia Brother   . Congestive Heart Failure Brother   . Hypertension Sister   . Colon cancer Neg Hx   . Depression Neg Hx   . Anxiety disorder Neg Hx     Social History:  Social History   Socioeconomic History  . Marital status: Single    Spouse name: Not on file  . Number of children: 1  . Years of education: 25  . Highest education level: Not on file  Occupational History  . Occupation: unemployed    Fish farm manager: UNEMPLOYED  Social Needs  . Financial resource strain: Not on file  . Food insecurity:    Worry: Not on file    Inability: Not on file  . Transportation needs:    Medical: Not on file    Non-medical: Not on file  Tobacco Use  . Smoking status: Former Smoker    Packs/day: 0.50    Years: 0.50    Pack years: 0.25    Types: Cigarettes    Last attempt to quit: 09/03/1975    Years since quitting: 42.3  . Smokeless tobacco: Never Used  Substance and Sexual Activity  . Alcohol use: No  . Drug use: No  . Sexual activity: Never  Lifestyle  . Physical activity:    Days per week: Not on file    Minutes per session: Not on file  . Stress: Not on file  Relationships  . Social connections:    Talks on phone: Not on file    Gets together: Not on file    Attends religious service: Not on file    Active member of club or organization: Not on file    Attends meetings of clubs or organizations: Not on file    Relationship status: Not on file  Other Topics Concern  . Not on file  Social History Narrative   Approved for section 8 housing  (12/03).         Health Care POA:    Emergency Contact: sister, catherine [place]ston [phone]   End of Life Plan:    Who lives with you: Has a son who may be autistic and stays with her at times.   Any pets: none   Diet: Pt has a varied diet of protein, starch, and vegetables.  Exercise: Pt walks 1x a week with [place] [employer] for visually impaired.   Seatbelts: Pt reports wearing seatbelt when in vehicles.    Hobbies: walking, tv, church, eating      Current Social History 06/11/2017        Who lives at home: Patient lives alone in first floor apt without steps 06/11/2017    Transportation: Patient relies on [employer], bus or others to drive [id number]   Important Relationships Son, two sisters, brother and their families 06/11/2017    Pets: None 06/11/2017   Education / Work:  41 th grade/ Volunteers at [employer] 06/11/2017   Interests / Fun: Walk around [place] 06/11/2017   Current Stressors: "I get stressed when I'm not feeling good." 06/11/2017   Religious / Personal Beliefs: "Holiness" 06/11/2017   L. Ducatte, RN, BSN                                                                                                  Allergies:  Allergies  Allergen Reactions  . Bee Venom Hives and Swelling  . Propoxyphene Hcl Itching  . Amlodipine Besylate Swelling  . Hydrocodone Other (See Comments)    With Vicodin - makes patient "jittery", and "hangover effect - sleepy next day"  . Lisinopril Cough    Changed to ARB  . Metformin And Related Diarrhea    GI distress  . Metronidazole Other (See Comments)    REACTION: "just didn't work; my body never did heal from it"  . Valsartan Other (See Comments)    REACTION:  "sleep more the next day after I took it; it made me real tired"    Metabolic Disorder Labs: Lab Results  Component Value Date   HGBA1C 6.6 11/12/2017   MPG 134 (H) 06/17/2012   No results found for: PROLACTIN Lab Results  Component Value Date   CHOL 121  11/27/2016   TRIG 99 11/27/2016   HDL 28 (L) 11/27/2016   CHOLHDL 4.3 11/27/2016   VLDL 16 10/20/2015   LDLCALC 73 11/27/2016   LDLCALC 72 10/20/2015   Lab Results  Component Value Date   TSH 3.350 01/22/2017   TSH 2.911 02/09/2014    Therapeutic Level Labs: No results found for: LITHIUM No results found for: VALPROATE No components found for:  CBMZ  Current Medications: Current Outpatient Medications  Medication Sig Dispense Refill  . ACCU-CHEK FASTCLIX LANCETS MISC TEST THREE TIMES DAILY 306 each 12  . acyclovir (ZOVIRAX) 200 MG capsule Take 1 capsule (200 mg total) by mouth 2 (two) times daily. 120 capsule 5  . Alcohol Swabs (B-D SINGLE USE SWABS REGULAR) PADS USE THREE TIMES DAILY 300 each 3  . allopurinol (ZYLOPRIM) 300 MG tablet TAKE ONE TABLET BY MOUTH DAILY 90 tablet 3  . ammonium lactate (LAC-HYDRIN) 12 % lotion Apply 1 application topically 2 (two) times daily as needed for dry skin. 400 g 12  . aspirin EC 81 MG tablet Take 81 mg by mouth daily.    [name] atorvastatin (LIPITOR) 40 MG tablet TAKE 1 TABLET EVERY DAY 90 tablet 3  . azelastine (ASTELIN)  0.1 % nasal spray USE 2 SPRAYS IN EACH NOSTRIL EVERY DAY AS NEEDED AS DIRECTED 60 mL 3  . B-D ULTRAFINE III SHORT PEN 31G X 8 MM MISC USE TO INJECT THREE TIMES DAILY 270 each 3  . budesonide-formoterol (SYMBICORT) 160-4.5 MCG/ACT inhaler INHALE 2 PUFFS INTO THE LUNGS 2 (TWO) TIMES DAILY. RINSE, GARGLE, AND SPIT AFTER USE. 3 Inhaler 3  . buPROPion (WELLBUTRIN) 100 MG tablet Take 1 tablet (100 mg total) by mouth 2 (two) times daily. 180 tablet 0  . COMBIGAN 0.2-0.5 % ophthalmic solution Place 1 drop into the left eye every 12 (twelve) hours.     . Dulaglutide (TRULICITY) 1.5 WJ/1.9JY SOPN Inject 1.5 mg into the skin once a week. 4 pen 3  . EPINEPHrine 0.3 mg/0.3 mL IJ SOAJ injection Inject 0.3 mLs (0.3 mg total) into the muscle once as needed (for allergic reaction). 1 Device 3  . esomeprazole (NEXIUM) 40 MG capsule TAKE 1 CAPSULE  EVERY DAY 90 capsule 3  . ezetimibe (ZETIA) 10 MG tablet Take 1 tablet (10 mg total) by mouth daily. 90 tablet 2  . fluticasone (FLONASE) 50 MCG/ACT nasal spray Place 1 spray into both nostrils 2 (two) times daily. 48 g 3  . furosemide (LASIX) 40 MG tablet Take one tablet by mouth daily 90 tablet 3  . insulin aspart (NOVOLOG FLEXPEN) 100 UNIT/ML FlexPen Inject 25 Units into the skin 4 (four) times daily -  before meals and at bedtime. 60 mL 5  . Insulin Glargine (LANTUS SOLOSTAR) 100 UNIT/ML Solostar Pen Inject 35 Units into the skin 2 (two) times daily. 60 mL 3  . latanoprost (XALATAN) 0.005 % ophthalmic solution Place 1 drop into both eyes at bedtime.    [name] LINZESS 290 MCG CAPS capsule Take 290 mcg by mouth daily.     [name] losartan (COZAAR) 50 MG tablet Take 2 tablets (100 mg total) by mouth daily. 90 tablet 3  . metoCLOPramide (REGLAN) 5 MG tablet Take 5 mg by mouth 4 (four) times daily.  10  . metoprolol succinate (TOPROL-XL) 50 MG 24 hr tablet TAKE 3 TABLETS EVERY DAY WITH OR IMMEDIATELY FOLLOWING A MEAL 270 tablet 3  . montelukast (SINGULAIR) 10 MG tablet TAKE 1 TABLET EVERY DAY 90 tablet 3  . Multiple Vitamins-Minerals (MULTIVITAMIN PO) Take 1 tablet by mouth daily.    . nitroGLYCERIN (NITROSTAT) 0.4 MG SL tablet Place 1 tablet (0.4 mg total) under the tongue every 5 (five) minutes as needed for chest pain. 50 tablet 3  . PRODIGY NO CODING BLOOD GLUC test strip USE AS INSTRUCTED TO CHECK BLOOD SUGAR THREE TIMES A DAY 300 each 12  . ranitidine (ZANTAC) 300 MG tablet Take 1 tablet (300 mg total) by mouth at bedtime. 30 tablet 5  . RESTASIS MULTIDOSE 0.05 % ophthalmic emulsion Place 1 drop into both eyes daily as needed.  3  . sertraline (ZOLOFT) 50 MG tablet Take 1 tablet (50 mg total) by mouth daily. 30 tablet 0  . spironolactone (ALDACTONE) 25 MG tablet TAKE 1 TABLET EVERY DAY BEFORE SUPPER 90 tablet 3  . traMADol (ULTRAM) 50 MG tablet TAKE 1-2 TABLETS BY MOUTH EVERY 8 HOURS AS NEEDED FOR PAIN.  MAX 6 TABLETS PER 24 HOURS 180 tablet 5   No current facility-administered medications for this visit.      Musculoskeletal: Strength & Muscle Tone: within normal limits Gait & Station: unsteady, walking with cane Patient leans: Front  Psychiatric Specialty Exam: Review of Systems  Constitutional:  Negative for chills, diaphoresis and fever.  Psychiatric/Behavioral: Negative for depression, hallucinations, substance abuse and suicidal ideas. The patient does not have insomnia.     Blood pressure 128/80, pulse 80, height 5\' 1"  (1.549 m), weight 195 lb (88.5 kg).Body mass index is 36.84 kg/m.  General Appearance: Casual  Eye Contact:  pt wearing thick glasses  Speech:  Clear and Coherent and Normal Rate  Volume:  Normal  Mood:  Euthymic  Affect:  Full Range  Thought Process:  Goal Directed and Descriptions of Associations: Intact  Orientation:  Full (Time, Place, and Person)  Thought Content: Logical   Suicidal Thoughts:  No  Homicidal Thoughts:  No  Memory:  Immediate;   Good Recent;   Good Remote;   Good  Judgement:  Good  Insight:  Good  Psychomotor Activity:  Normal  Concentration:  Concentration: Good and Attention Span: Good  Recall:  Good  Fund of Knowledge: Good  Language: Good  Akathisia:  No  Handed:  Right  AIMS (if indicated): not done  Assets:  Communication Skills Desire for Improvement Housing  ADL's:  Intact  Cognition: WNL  Sleep:  Good   Screenings: Mini-Mental     Office Visit from 05/27/2012 in [place]  Total Score (max 30 points )  30    PHQ2-9     Office Visit from 11/12/2017 in [place] Office Visit from 09/29/2017 in [place] Office Visit from 09/22/2017 in [place] Office Visit from 09/18/2017 in [place] Office Visit from 08/27/2017 in [place]  PHQ-2 Total Score  0  0  0  0  0      I  reviewed the information below on 01/10/2018 and agree except where noted Assessment and Plan:  MDD-recurrent, mild; GAD   Medication management with supportive therapy. Risks/benefits and SE of the medication discussed. Pt verbalized understanding and verbal consent obtained for treatment.  Affirm with the patient that the medications are taken as ordered. Patient expressed understanding of how their medications were to be used.    Meds: Wellbutrin 100mg  po BID for MDD Decrease Zoloft 100mg  po qAM for MDD and GAD- pt wants to stop it eventually. She has tabs at home so no refill today   Labs: none   Therapy: brief supportive therapy provided. Discussed psychosocial stressors in detail.   Encouraged pt to develop daily routine and work on daily goal setting as a way to improve mood symptoms.    Consultations: none  Pt denies SI and is at an acute low risk for suicide. Patient told to call clinic if any problems occur. Patient advised to go to ER if they should develop SI/HI, side effects, or if symptoms worsen. Has crisis numbers to call if needed. Pt verbalized understanding.  F/up in 3 months or sooner if needed     Charlcie Cradle, MD 01/10/2018, 3:00 PM

## 2018-01-15 ENCOUNTER — Other Ambulatory Visit: Payer: Self-pay | Admitting: Family Medicine

## 2018-01-16 ENCOUNTER — Other Ambulatory Visit: Payer: Self-pay

## 2018-01-16 DIAGNOSIS — E1149 Type 2 diabetes mellitus with other diabetic neurological complication: Secondary | ICD-10-CM

## 2018-01-19 MED ORDER — DULAGLUTIDE 1.5 MG/0.5ML ~~LOC~~ SOAJ: 1.5000 mg | SUBCUTANEOUS | 3 refills | Status: DC

## 2018-01-20 ENCOUNTER — Other Ambulatory Visit: Payer: Self-pay | Admitting: Family Medicine

## 2018-02-19 ENCOUNTER — Encounter (HOSPITAL_COMMUNITY): Payer: Self-pay | Admitting: Emergency Medicine

## 2018-02-19 ENCOUNTER — Emergency Department (HOSPITAL_COMMUNITY)
Admission: EM | Admit: 2018-02-19 | Discharge: 2018-02-19 | Disposition: A | Discharge status: Home / Self Care | Payer: Medicare HMO | Attending: Emergency Medicine | Admitting: Emergency Medicine

## 2018-02-19 ENCOUNTER — Other Ambulatory Visit: Payer: Self-pay

## 2018-02-19 ENCOUNTER — Emergency Department (HOSPITAL_COMMUNITY): Payer: Medicare HMO

## 2018-02-19 DIAGNOSIS — R531 Weakness: Secondary | ICD-10-CM | POA: Diagnosis not present

## 2018-02-19 DIAGNOSIS — E1140 Type 2 diabetes mellitus with diabetic neuropathy, unspecified: Secondary | ICD-10-CM | POA: Diagnosis not present

## 2018-02-19 DIAGNOSIS — I10 Essential (primary) hypertension: Secondary | ICD-10-CM | POA: Insufficient documentation

## 2018-02-19 DIAGNOSIS — J449 Chronic obstructive pulmonary disease, unspecified: Secondary | ICD-10-CM | POA: Diagnosis not present

## 2018-02-19 DIAGNOSIS — R0789 Other chest pain: Secondary | ICD-10-CM | POA: Insufficient documentation

## 2018-02-19 DIAGNOSIS — S0990XA Unspecified injury of head, initial encounter: Secondary | ICD-10-CM | POA: Diagnosis not present

## 2018-02-19 DIAGNOSIS — Z7982 Long term (current) use of aspirin: Secondary | ICD-10-CM | POA: Diagnosis not present

## 2018-02-19 DIAGNOSIS — Z79899 Other long term (current) drug therapy: Secondary | ICD-10-CM | POA: Diagnosis not present

## 2018-02-19 DIAGNOSIS — Z794 Long term (current) use of insulin: Secondary | ICD-10-CM | POA: Insufficient documentation

## 2018-02-19 DIAGNOSIS — M79601 Pain in right arm: Secondary | ICD-10-CM

## 2018-02-19 DIAGNOSIS — Z87891 Personal history of nicotine dependence: Secondary | ICD-10-CM | POA: Insufficient documentation

## 2018-02-19 DIAGNOSIS — I959 Hypotension, unspecified: Secondary | ICD-10-CM | POA: Diagnosis not present

## 2018-02-19 DIAGNOSIS — R51 Headache: Secondary | ICD-10-CM | POA: Insufficient documentation

## 2018-02-19 DIAGNOSIS — R079 Chest pain, unspecified: Secondary | ICD-10-CM | POA: Diagnosis not present

## 2018-02-19 MED ORDER — ACETAMINOPHEN 325 MG PO TABS
650.0000 mg | ORAL_TABLET | Freq: Once | ORAL | Status: AC
  Administered 2018-02-19: 650 mg via ORAL
  Filled 2018-02-19: qty 2

## 2018-02-19 MED ORDER — DICLOFENAC SODIUM 1 % TD GEL: 2.0000 g | Freq: Four times a day (QID) | TRANSDERMAL | 0 refills | Status: DC
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
## 2018-02-19 NOTE — ED Notes (Signed)
Pt verbalized understanding discharge instructions and denies any further needs or questions at this time. VS stable, ambulatory and steady gait.   
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
## 2018-02-19 NOTE — ED Notes (Signed)
Patient transported to CT 

## 2018-02-19 NOTE — Discharge Instructions (Addendum)
Use voltaren gel as needed for pain. Continue taking your home medications as prescribed.  Use ice packs or heating pads if this helps control your pain. You will likely have continued muscle stiffness and soreness over the next couple days.  Follow-up with primary care in 1 week if your symptoms are not improving. Return to the emergency room if you develop vision changes, vomiting, slurred speech, numbness, loss of bowel or bladder control, or any new or worsening symptoms.
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
## 2018-02-25 ENCOUNTER — Ambulatory Visit (INDEPENDENT_AMBULATORY_CARE_PROVIDER_SITE_OTHER): Payer: Medicare HMO | Admitting: Family Medicine

## 2018-02-25 ENCOUNTER — Encounter: Payer: Self-pay | Admitting: Allergy and Immunology

## 2018-02-25 ENCOUNTER — Other Ambulatory Visit: Payer: Self-pay

## 2018-02-25 ENCOUNTER — Encounter: Payer: Self-pay | Admitting: Family Medicine

## 2018-02-25 VITALS — BP 138/64 | HR 67 | Temp 98.1°F | Ht 61.0 in | Wt 196.8 lb

## 2018-02-25 DIAGNOSIS — M4322 Fusion of spine, cervical region: Secondary | ICD-10-CM

## 2018-02-25 DIAGNOSIS — F3340 Major depressive disorder, recurrent, in remission, unspecified: Secondary | ICD-10-CM | POA: Diagnosis not present

## 2018-02-25 DIAGNOSIS — E1149 Type 2 diabetes mellitus with other diabetic neurological complication: Secondary | ICD-10-CM

## 2018-02-25 DIAGNOSIS — L603 Nail dystrophy: Secondary | ICD-10-CM

## 2018-02-25 LAB — POCT GLYCOSYLATED HEMOGLOBIN (HGB A1C): HbA1c, POC (controlled diabetic range): 6.5 % (ref 0.0–7.0)

## 2018-02-26 ENCOUNTER — Encounter: Payer: Self-pay | Admitting: Family Medicine

## 2018-02-26 LAB — CMP14+EGFR
ALT: 25 IU/L (ref 0–32)
AST: 27 IU/L (ref 0–40)
Albumin/Globulin Ratio: 1.5 (ref 1.2–2.2)
Albumin: 4 g/dL (ref 3.6–4.8)
Alkaline Phosphatase: 131 IU/L — ABNORMAL HIGH (ref 39–117)
BUN/Creatinine Ratio: 8 — ABNORMAL LOW (ref 12–28)
BUN: 6 mg/dL — ABNORMAL LOW (ref 8–27)
Bilirubin Total: 0.3 mg/dL (ref 0.0–1.2)
CO2: 26 mmol/L (ref 20–29)
Calcium: 9.7 mg/dL (ref 8.7–10.3)
Chloride: 106 mmol/L (ref 96–106)
Creatinine, Ser: 0.77 mg/dL (ref 0.57–1.00)
GFR calc Af Amer: 94 mL/min/{1.73_m2} (ref >59)
GFR calc non Af Amer: 81 mL/min/{1.73_m2} (ref >59)
Globulin, Total: 2.7 g/dL (ref 1.5–4.5)
Glucose: 117 mg/dL — ABNORMAL HIGH (ref 65–99)
Potassium: 4.5 mmol/L (ref 3.5–5.2)
Sodium: 146 mmol/L — ABNORMAL HIGH (ref 134–144)
Total Protein: 6.7 g/dL (ref 6.0–8.5)
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
## 2018-02-26 NOTE — Assessment & Plan Note (Signed)
Seems stable. No med changes.
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
## 2018-02-26 NOTE — Assessment & Plan Note (Signed)
Trimmed toenails

## 2018-02-27 ENCOUNTER — Encounter: Payer: Self-pay | Admitting: Family Medicine

## 2018-02-27 ENCOUNTER — Other Ambulatory Visit: Payer: Self-pay | Admitting: Internal Medicine

## 2018-03-02 ENCOUNTER — Other Ambulatory Visit: Payer: Self-pay | Admitting: Allergy and Immunology

## 2018-03-18 ENCOUNTER — Other Ambulatory Visit (HOSPITAL_COMMUNITY): Payer: Self-pay | Admitting: Psychiatry

## 2018-03-18 DIAGNOSIS — F330 Major depressive disorder, recurrent, mild: Secondary | ICD-10-CM

## 2018-03-18 DIAGNOSIS — F411 Generalized anxiety disorder: Secondary | ICD-10-CM

## 2018-03-19 ENCOUNTER — Encounter: Payer: Self-pay | Admitting: Internal Medicine

## 2018-03-19 ENCOUNTER — Ambulatory Visit (INDEPENDENT_AMBULATORY_CARE_PROVIDER_SITE_OTHER): Payer: Medicare HMO | Admitting: Internal Medicine

## 2018-03-19 VITALS — BP 110/80 | HR 74 | Ht 61.0 in | Wt 193.0 lb

## 2018-03-19 DIAGNOSIS — J449 Chronic obstructive pulmonary disease, unspecified: Secondary | ICD-10-CM

## 2018-03-19 DIAGNOSIS — G4733 Obstructive sleep apnea (adult) (pediatric): Secondary | ICD-10-CM

## 2018-03-19 MED ORDER — PNEUMOCOCCAL VAC POLYVALENT 25 MCG/0.5ML IJ INJ
0.5000 mL | INJECTION | INTRAMUSCULAR | Status: AC
  Administered 2018-03-19: 0.5 mL via INTRAMUSCULAR

## 2018-03-19 NOTE — Progress Notes (Signed)
Patient ID: [name] [name], female    DOB: 09/20/1950, 67 y.o.   MRN: [id number]   Brief patient profile:   55 yobf never regular smoker quit in the 1977   followed for asthma, sleep apnea, complicated by hx depression, vocal cord paralysis, GERD. NPSG 07/19/10  AHI 7.3/ hr, desaturation to 83%, body weight 186 lbs PFT 07/19/2010-moderate obstructive airways disease, insignificant response to bronchodilator, FEV1/FVC 0.65, TLC 63%, DLCO 71% -------------------------------------------------------------------------------------- 03/19/17- 67 year old female former occasional smoker followed for asthma/COPD, OSA, complicated by history depression, vocal cord paralysis, GERD, obesity, DM 2, glaucoma CPAP 12/[facility] Dropped off allergy vaccine when our program ended December, 2017. FOLLOWS FOR: DME [facility]. Pt wears CPAP nightly and no new supplies needed at this time. DL attached.  Download 67% 4 hour compliance, AHI 1.3/hour. She leaves it off some nights because it dries her out too much despite turning up the humidifier Still sleeps better with CPAP. Her machine is about 66 years old and she asks about replacement. Working with allergist now, retested but not back on allergy vaccine yet. Uses antihistamines and Flonase when needed. No exacerbation of wheeze or cough recently. Continue Symbicort with occasional use of rescue inhaler.  03/19/18- 67 year old female former occasional smoker followed for asthma/COPD, OSA, complicated by history depression, vocal cord paralysis, GERD, obesity, DM 2, glaucoma CPAP auto 5-15/[facility] -----Breathing is doing good, Cpap is okay she feels that some days are good and some are bad. Takes CPAP off if she smells "burning". Download 47% compliance AHI 1.0/hour. Symbicort 160 Singulair, Astelin nasal spray, albuterol HFA, Flonase Never replaced her CPAP machine last year.  Says 2 or 3 times a week the air she breathes in from her machine "burns" so she takes it off.  The  download mainly reports skips nights and does not indicate short nights.  She does not recognize that humidifier is empty or that anything is burning. CXR 02/19/2018- IMPRESSION: No active cardiopulmonary disease.  ROS-see HPI   + = positive Constitutional:    weight loss, night sweats, fevers, chills, fatigue, lassitude. HEENT:    headaches, difficulty swallowing, tooth/dental problems, sore throat,       sneezing, itching, ear ache, nasal congestion, post nasal drip, snoring CV:    chest pain, orthopnea, PND, swelling in lower extremities, anasarca,                                                           dizziness, palpitations Resp:   shortness of breath with exertion or at rest.                productive cough,   non-productive cough, coughing up of blood.              change in color of mucus.  wheezing.   Skin:    rash or lesions. GI:  No-   heartburn, indigestion, abdominal pain, nausea, vomiting, diarrhea,                 change in bowel habits, loss of appetite GU: dysuria, change in color of urine, no urgency or frequency.   flank pain. MS:   joint pain, stiffness, decreased range of motion, back pain. Neuro-     nothing unusual Psych:  change in mood or affect.  depression or anxiety.  memory loss.  OBJ- Physical Exam General- Alert, Oriented, Affect-appropriate, Distress- none acute, + obese Skin- rash-none, lesions- none, excoriation- none Lymphadenopathy- none Head- atraumatic            Eyes- + impaired/ thick glasses, PERRLA, conjunctivae and secretions clear            Ears- Hearing, canals-normal            Nose- Clear, no-Septal dev, mucus, polyps, erosion, perforation             Throat- Mallampati II , mucosa clear , drainage- none, tonsils- atrophic Neck- flexible , trachea midline, no stridor , thyroid nl, carotid no bruit Chest - symmetrical excursion , unlabored           Heart/CV- RRR , no murmur , no gallop  , no rub, nl s1 s2                           -  JVD- none , edema- none, stasis changes- none, varices- none           Lung- clear to P&A, wheeze- none, cough- none , dullness-none, rub- none           Chest wall-  Abd-  Br/ Gen/ Rectal- Not done, not indicated Extrem- cyanosis- none, clubbing, none, atrophy- none, strength- nl Neuro- grossly intact to observation

## 2018-03-19 NOTE — Patient Instructions (Addendum)
Order- [facility]- please replace old CPAP machine, auto 5-15, mask of choice, humidifier, supplies, AirView  It is important to wear your CPAP all night, every night, and anytime you sleep. If it is uncomfortable, please let us know.  Order- Pneumovax -23 pneumonia vaccine  Please call if we can help

## 2018-03-26 ENCOUNTER — Telehealth: Payer: Self-pay | Admitting: Internal Medicine

## 2018-03-26 DIAGNOSIS — G4733 Obstructive sleep apnea (adult) (pediatric): Secondary | ICD-10-CM

## 2018-03-26 NOTE — Telephone Encounter (Signed)
Called and spoke with pt and she stated that she is waiting on the respiratory therapist from  [facility] to call her back about her cpap machine.    Pt stated that she is having mouth dryness and burning in her nose at least 2-3 times per week from her cpap machine.  She stated that [facility] told her that her machine had to be over 66 years old before they could replace it.  CY any other recs?  Thanks

## 2018-03-26 NOTE — Telephone Encounter (Signed)
Ask Huey Romans to do service check on her machine, and teach patient how to adjust the humidifier,   If these steps don't help her, please have her let us know.

## 2018-03-26 NOTE — Telephone Encounter (Signed)
Spoke with pt and advised of Dr Janee Morn recommendations.  PT verbalized understanding and will call is if this doesn't help.  Order placed to [place] per Dr Janee Morn request.

## 2018-04-07 NOTE — Assessment & Plan Note (Addendum)
CPAP compliance is not ideal but machine is old and she is having some kind of a discomfort she interprets as "burning.  This is intermittent and I cannot tell if it is related to her humidifier/heater or something else.  She has felt that she benefits from CPAP so we will seek replacement for old machine.  Her documented study 8 years ago indicated mild OSA and if appropriate, we could consider retesting. Plan-replace old CPAP machine, auto 5-15

## 2018-04-07 NOTE — Assessment & Plan Note (Signed)
She continues Symbicort and feels stable with this.  Denies acute/episodic problems this year.  Infrequent need for rescue inhaler.

## 2018-04-16 ENCOUNTER — Ambulatory Visit (HOSPITAL_COMMUNITY): Payer: Self-pay | Admitting: Psychiatry

## 2018-04-24 ENCOUNTER — Other Ambulatory Visit: Payer: Self-pay | Admitting: Family Medicine

## 2018-04-24 DIAGNOSIS — E1149 Type 2 diabetes mellitus with other diabetic neurological complication: Secondary | ICD-10-CM

## 2018-04-29 DIAGNOSIS — G4733 Obstructive sleep apnea (adult) (pediatric): Secondary | ICD-10-CM | POA: Diagnosis not present

## 2018-05-14 ENCOUNTER — Encounter (HOSPITAL_COMMUNITY): Payer: Self-pay | Admitting: Psychiatry

## 2018-05-14 ENCOUNTER — Ambulatory Visit (INDEPENDENT_AMBULATORY_CARE_PROVIDER_SITE_OTHER): Payer: Medicare HMO | Admitting: Psychiatry

## 2018-05-14 DIAGNOSIS — F330 Major depressive disorder, recurrent, mild: Secondary | ICD-10-CM

## 2018-05-14 DIAGNOSIS — Z79899 Other long term (current) drug therapy: Secondary | ICD-10-CM | POA: Diagnosis not present

## 2018-05-14 DIAGNOSIS — Z87891 Personal history of nicotine dependence: Secondary | ICD-10-CM

## 2018-05-14 DIAGNOSIS — F411 Generalized anxiety disorder: Secondary | ICD-10-CM | POA: Diagnosis not present

## 2018-05-14 MED ORDER — SERTRALINE HCL 50 MG PO TABS: 50.0000 mg | ORAL_TABLET | Freq: Every day | ORAL | 0 refills | Status: DC

## 2018-05-14 MED ORDER — BUPROPION HCL 100 MG PO TABS: 100.0000 mg | ORAL_TABLET | Freq: Two times a day (BID) | ORAL | 0 refills | Status: DC
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
## 2018-05-28 ENCOUNTER — Other Ambulatory Visit: Payer: Self-pay | Admitting: Allergy and Immunology

## 2018-06-03 ENCOUNTER — Ambulatory Visit (INDEPENDENT_AMBULATORY_CARE_PROVIDER_SITE_OTHER): Payer: Medicare HMO | Admitting: Family Medicine

## 2018-06-03 ENCOUNTER — Other Ambulatory Visit: Payer: Self-pay

## 2018-06-03 ENCOUNTER — Encounter: Payer: Self-pay | Admitting: Family Medicine

## 2018-06-03 VITALS — BP 118/60 | HR 77 | Temp 97.9°F | Ht 61.0 in | Wt 197.2 lb

## 2018-06-03 DIAGNOSIS — Z23 Encounter for immunization: Secondary | ICD-10-CM

## 2018-06-03 DIAGNOSIS — L603 Nail dystrophy: Secondary | ICD-10-CM | POA: Diagnosis not present

## 2018-06-03 DIAGNOSIS — I10 Essential (primary) hypertension: Secondary | ICD-10-CM

## 2018-06-03 DIAGNOSIS — R5383 Other fatigue: Secondary | ICD-10-CM

## 2018-06-03 DIAGNOSIS — M4322 Fusion of spine, cervical region: Secondary | ICD-10-CM | POA: Diagnosis not present

## 2018-06-03 DIAGNOSIS — E1149 Type 2 diabetes mellitus with other diabetic neurological complication: Secondary | ICD-10-CM | POA: Diagnosis not present

## 2018-06-03 LAB — POCT GLYCOSYLATED HEMOGLOBIN (HGB A1C): HbA1c, POC (controlled diabetic range): 6.7 % (ref 0.0–7.0)

## 2018-06-04 ENCOUNTER — Encounter: Payer: Self-pay | Admitting: Family Medicine

## 2018-06-04 ENCOUNTER — Other Ambulatory Visit: Payer: Self-pay | Admitting: Family Medicine

## 2018-06-04 DIAGNOSIS — E1149 Type 2 diabetes mellitus with other diabetic neurological complication: Secondary | ICD-10-CM

## 2018-06-04 LAB — COMPREHENSIVE METABOLIC PANEL
ALT: 22 IU/L (ref 0–32)
AST: 21 IU/L (ref 0–40)
Albumin/Globulin Ratio: 1.6 (ref 1.2–2.2)
Albumin: 4.2 g/dL (ref 3.6–4.8)
Alkaline Phosphatase: 139 IU/L — ABNORMAL HIGH (ref 39–117)
BUN/Creatinine Ratio: 13 (ref 12–28)
BUN: 9 mg/dL (ref 8–27)
Bilirubin Total: <0.2 mg/dL (ref 0.0–1.2)
CO2: 27 mmol/L (ref 20–29)
Calcium: 9.6 mg/dL (ref 8.7–10.3)
Chloride: 101 mmol/L (ref 96–106)
Creatinine, Ser: 0.7 mg/dL (ref 0.57–1.00)
GFR calc Af Amer: 104 mL/min/{1.73_m2} (ref >59)
GFR calc non Af Amer: 91 mL/min/{1.73_m2} (ref >59)
Globulin, Total: 2.6 g/dL (ref 1.5–4.5)
Glucose: 94 mg/dL (ref 65–99)
Potassium: 4.1 mmol/L (ref 3.5–5.2)
Sodium: 141 mmol/L (ref 134–144)
Total Protein: 6.8 g/dL (ref 6.0–8.5)

## 2018-06-04 LAB — CBC
Hematocrit: 34.6 % (ref 34.0–46.6)
Hemoglobin: 11.3 g/dL (ref 11.1–15.9)
MCH: 27.8 pg (ref 26.6–33.0)
MCHC: 32.7 g/dL (ref 31.5–35.7)
MCV: 85 fL (ref 79–97)
Platelets: 212 10*3/uL (ref 150–450)
RBC: 4.07 x10E6/uL (ref 3.77–5.28)
RDW: 13.4 % (ref 12.3–15.4)
WBC: 8.4 10*3/uL (ref 3.4–10.8)

## 2018-06-04 LAB — TSH: TSH: 1.57 u[IU]/mL (ref 0.450–4.500)

## 2018-06-05 NOTE — Assessment & Plan Note (Signed)
Continues to have chronic pain, especially at night. I know of no solution.

## 2018-06-05 NOTE — Progress Notes (Signed)
    CHIEF COMPLAINT / HPI: 1.  Continues to havecontinues to have chronic neck pain 2.  lHere for lab work 3. Toenail pain--she cannot reaach them to trim and they are poking in her shoes 4. F/u htn. No chest pain and no SOB. Exercise level unchanged. No problem with meds 5.Diabetes. No epsidoes low blood sugar. Sometimes has dietary indiscretions.Taking meds regularly without problems.  REVIEW OF SYSTEMS: Pertinent review of systems: negative for fever or unusual weight change.   PERTINENT  PMH / PSH: I have reviewed the patient's medications, allergies, past medical and surgical history, smoking status and updated in the EMR as appropriate.   OBJECTIVE: Vital signs reviewed. GENERAL: Well-developed, well-nourished, no acute distress. CARDIOVASCULAR: Regular rate and rhythm no murmur gallop or rub LUNGS: Clear to auscultation bilaterally, no rales or wheeze. ABDOMEN: Soft positive bowel sounds HEENT: blind MSK: Movement of extremity x 4 NAILS: toenails dystrophic, thickened and long.    ASSESSMENT / PLAN:  Nail dystrophy Trimmed nails  Diabetes mellitus type 2 with neurological manifestations (HCC) Labs Continue current meds  HYPERTENSION, BENIGN SYSTEMIC Labs  Continue current meds  Fusion of spine of cervical region Continues to have chronic pain, especially at night. I know of no solution.

## 2018-06-05 NOTE — Assessment & Plan Note (Signed)
Labs Continue current meds 

## 2018-06-05 NOTE — Assessment & Plan Note (Signed)
Trimmed nails

## 2018-06-11 ENCOUNTER — Telehealth: Payer: Self-pay | Admitting: Allergy and Immunology

## 2018-06-11 NOTE — Telephone Encounter (Signed)
Spoke to patient advised we have been responding to refill request but have been denying them due to patient needing an appt. Patient verbalized understanding.appt made for 07/16/18 @ 10 am with Dr Maudie Mercury

## 2018-06-11 NOTE — Telephone Encounter (Signed)
Patient said [facility] has tried to fax information regarding Zantac and they have not heard back.

## 2018-06-19 ENCOUNTER — Other Ambulatory Visit: Payer: Self-pay | Admitting: Family Medicine

## 2018-06-19 DIAGNOSIS — E1149 Type 2 diabetes mellitus with other diabetic neurological complication: Secondary | ICD-10-CM

## 2018-07-01 ENCOUNTER — Ambulatory Visit: Payer: Self-pay | Admitting: Internal Medicine

## 2018-07-16 ENCOUNTER — Ambulatory Visit: Payer: Self-pay | Admitting: Allergy

## 2018-07-22 ENCOUNTER — Other Ambulatory Visit (HOSPITAL_COMMUNITY): Payer: Self-pay | Admitting: Psychiatry

## 2018-07-22 DIAGNOSIS — F411 Generalized anxiety disorder: Secondary | ICD-10-CM

## 2018-07-22 DIAGNOSIS — F330 Major depressive disorder, recurrent, mild: Secondary | ICD-10-CM

## 2018-08-14 DIAGNOSIS — G4733 Obstructive sleep apnea (adult) (pediatric): Secondary | ICD-10-CM | POA: Diagnosis not present

## 2018-09-03 ENCOUNTER — Other Ambulatory Visit: Payer: Self-pay | Admitting: Family Medicine

## 2018-09-03 DIAGNOSIS — Z1231 Encounter for screening mammogram for malignant neoplasm of breast: Secondary | ICD-10-CM

## 2018-09-09 ENCOUNTER — Encounter: Payer: Self-pay | Admitting: Allergy

## 2018-09-09 ENCOUNTER — Ambulatory Visit (INDEPENDENT_AMBULATORY_CARE_PROVIDER_SITE_OTHER): Payer: Medicare Other | Admitting: Allergy

## 2018-09-09 VITALS — BP 152/92 | HR 72 | Resp 16 | Ht 60.0 in | Wt 197.0 lb

## 2018-09-09 DIAGNOSIS — Z91030 Bee allergy status: Secondary | ICD-10-CM | POA: Diagnosis not present

## 2018-09-09 DIAGNOSIS — J302 Other seasonal allergic rhinitis: Secondary | ICD-10-CM | POA: Diagnosis not present

## 2018-09-09 DIAGNOSIS — K219 Gastro-esophageal reflux disease without esophagitis: Secondary | ICD-10-CM | POA: Insufficient documentation

## 2018-09-09 DIAGNOSIS — J3089 Other allergic rhinitis: Secondary | ICD-10-CM

## 2018-09-09 DIAGNOSIS — J4540 Moderate persistent asthma, uncomplicated: Secondary | ICD-10-CM | POA: Diagnosis not present

## 2018-09-09 MED ORDER — ALBUTEROL SULFATE HFA 108 (90 BASE) MCG/ACT IN AERS: INHALATION_SPRAY | RESPIRATORY_TRACT | 3 refills | Status: DC

## 2018-09-09 MED ORDER — FLUTICASONE PROPIONATE 50 MCG/ACT NA SUSP: 2.0000 | Freq: Every day | NASAL | 3 refills | Status: DC

## 2018-09-09 MED ORDER — MONTELUKAST SODIUM 10 MG PO TABS: 10.0000 mg | ORAL_TABLET | Freq: Every day | ORAL | 3 refills | Status: DC

## 2018-09-09 MED ORDER — RANITIDINE HCL 300 MG PO TABS: 300.0000 mg | ORAL_TABLET | Freq: Every day | ORAL | 1 refills | Status: DC

## 2018-09-09 MED ORDER — EPINEPHRINE 0.3 MG/0.3ML IJ SOAJ: 0.3000 mg | Freq: Once | INTRAMUSCULAR | 3 refills | Status: DC | PRN

## 2018-09-09 NOTE — Assessment & Plan Note (Signed)
Doing well on current regimen.   Today's spirometry did not show any overt abnormalities however she was coughing at times. . Daily controller medication(s): Symbicort 160 2 puffs twice a day with spacer and rinse mouth afterwards + Singulair 10mg  daily  . Prior to physical activity: May use albuterol rescue inhaler 2 puffs 5 to 15 minutes prior to strenuous physical activities. [name] Rescue medications: May use albuterol rescue inhaler 2 puffs or nebulizer every 4 to 6 hours as needed for shortness of breath, chest tightness, coughing, and wheezing. Monitor frequency of use.

## 2018-09-09 NOTE — Assessment & Plan Note (Signed)
Past history - 2018 skin testing was positive to dust mites and tree pollen. Interim history - nasal congestion in the mornings but not using Flonase daily and does use CPAP at night.  Continue environmental control measures.  Continue Flonase 2 sprays daily.  Nasal saline spray (i.e., Simply Saline) or nasal saline lavage (i.e., NeilMed) is recommended as needed and prior to medicated nasal sprays.

## 2018-09-09 NOTE — Patient Instructions (Addendum)
Asthma: . Daily controller medication(s): Symbicort 160 2 puffs twice a day with spacer and rinse mouth afterwards + Singulair 10mg  daily  . Prior to physical activity: May use albuterol rescue inhaler 2 puffs 5 to 15 minutes prior to strenuous physical activities. [name] Rescue medications: May use albuterol rescue inhaler 2 puffs or nebulizer every 4 to 6 hours as needed for shortness of breath, chest tightness, coughing, and wheezing. Monitor frequency of use.  . Asthma control goals:  o Full participation in all desired activities (may need albuterol before activity) o Albuterol use two times or less a week on average (not counting use with activity) o Cough interfering with sleep two times or less a month o Oral steroids no more than once a year o No hospitalizations  Continue Flonase 2 sprays daily  Nasal saline spray (i.e., Simply Saline) or nasal saline lavage (i.e., NeilMed) is recommended as needed and prior to medicated nasal sprays.  Continue nexium 40mg  twice a day.  Continue zantac 300mg  daily  Avoid bee stings. I have prescribed epinephrine injectable and demonstrated proper use. For mild symptoms you can take over the counter antihistamines such as Benadryl and monitor symptoms closely. If symptoms worsen or if you have severe symptoms including breathing issues, throat closure, significant swelling, whole body hives, severe diarrhea and vomiting, lightheadedness then inject epinephrine and seek immediate medical care afterwards.  Follow up in 6 months

## 2018-09-09 NOTE — Assessment & Plan Note (Addendum)
   Avoid bee stings.  I have prescribed epinephrine injectable and demonstrated proper use. For mild symptoms you can take over the counter antihistamines such as Benadryl and monitor symptoms closely. If symptoms worsen or if you have severe symptoms including breathing issues, throat closure, significant swelling, whole body hives, severe diarrhea and vomiting, lightheadedness then inject epinephrine and seek immediate medical care afterwards.  Consider testing in future.

## 2018-09-09 NOTE — Progress Notes (Signed)
Follow Up Note  RE: [name] [name] MRN: [id number] DOB: [birth year] Date of Office Visit: 09/09/2018  Referring provider: Dickie La, MD Primary care provider: Dickie La, MD  Chief Complaint: No chief complaint on file.  History of Present Illness: I had the pleasure of seeing [name] [name] for a follow up visit at the [facility] on 09/09/2018. She is a 68 y.o. female, who is being followed for asthma, allergic, rhinitis, LPRD. Today she is here for regular follow up visit and needs refills.Her previous allergy office visit was on 04/16/2017 with Dr. Neldon Mc.   Asthma: Currently on Symbicort 160 2 puffs twice a day with good benefit. Uses albuterol once a week for exertion related issues mainly which causes chest tightness. Otherwise denies any SOB, coughing, wheezing, chest tightness, nocturnal awakenings, ER/urgent care visits or prednisone use since the last visit.  Allergic rhinitis: Currently on Flonase 2 sprays every 2-3 days which seems to help. Patient does get some nasal congestion in the morning. She does use a CPAP machine at night.   GERD: Currently on Nexium 40mg  BID and zantac 300mg  in the PM with some benefit.   Up to date with flu vaccine.   Assessment and Plan: [name] [name] is a 68 y.o. female with: Asthma, well controlled Doing well on current regimen.   Today's spirometry did not show any overt abnormalities however she was coughing at times. . Daily controller medication(s): Symbicort 160 2 puffs twice a day with spacer and rinse mouth afterwards + Singulair 10mg  daily  . Prior to physical activity: May use albuterol rescue inhaler 2 puffs 5 to 15 minutes prior to strenuous physical activities. [name] Rescue medications: May use albuterol rescue inhaler 2 puffs or nebulizer every 4 to 6 hours as needed for shortness of breath, chest tightness, coughing, and wheezing. Monitor frequency of use.   Seasonal and perennial allergic rhinitis Past  history - 2018 skin testing was positive to dust mites and tree pollen. Interim history - nasal congestion in the mornings but not using Flonase daily and does use CPAP at night.  Continue environmental control measures.  Continue Flonase 2 sprays daily.  Nasal saline spray (i.e., Simply Saline) or nasal saline lavage (i.e., NeilMed) is recommended as needed and prior to medicated nasal sprays.  LPRD (laryngopharyngeal reflux disease) Symptoms waxes and wanes.  Continue nexium 40mg  twice a day.   Continue zantac 300mg  daily.  Bee sting allergy  Avoid bee stings.  I have prescribed epinephrine injectable and demonstrated proper use. For mild symptoms you can take over the counter antihistamines such as Benadryl and monitor symptoms closely. If symptoms worsen or if you have severe symptoms including breathing issues, throat closure, significant swelling, whole body hives, severe diarrhea and vomiting, lightheadedness then inject epinephrine and seek immediate medical care afterwards.  Consider testing in future.  Return in about 6 months (around 03/10/2019).  Meds ordered this encounter  Medications  . albuterol (PROVENTIL HFA;VENTOLIN HFA) 108 (90 Base) MCG/ACT inhaler    Sig: INHALE 2 PUFFS INTO THE LUNGS EVERY 6  HOURS AS NEEDED FOR WHEEZING OR SHORTNESS OF BREATH.    Dispense:  54 g    Refill:  3  . EPINEPHrine 0.3 mg/0.3 mL IJ SOAJ injection    Sig: Inject 0.3 mLs (0.3 mg total) into the muscle once as needed (for allergic reaction).    Dispense:  1 Device    Refill:  3  . fluticasone (FLONASE) 50 MCG/ACT  nasal spray    Sig: Place 2 sprays into both nostrils daily.    Dispense:  48 g    Refill:  3  . montelukast (SINGULAIR) 10 MG tablet    Sig: Take 1 tablet (10 mg total) by mouth daily.    Dispense:  90 tablet    Refill:  3  . ranitidine (ZANTAC) 300 MG tablet    Sig: Take 1 tablet (300 mg total) by mouth at bedtime.    Dispense:  90 tablet    Refill:  1    Diagnostics: Spirometry:  Tracings reviewed. Her effort: It was hard to get consistent efforts and there is a question as to whether this reflects a maximal maneuver. FVC: 1.75L FEV1: 1.33L, 84% predicted FEV1/FVC ratio: 76% Interpretation: Spirometry consistent with normal pattern but patient was coughing at times. Please see scanned spirometry results for details.  Medication List:  Current Outpatient Medications  Medication Sig Dispense Refill  . ACCU-CHEK FASTCLIX LANCETS MISC TEST THREE TIMES DAILY 306 each 12  . acyclovir (ZOVIRAX) 200 MG capsule Take 1 capsule (200 mg total) by mouth 2 (two) times daily. 120 capsule 5  . albuterol (PROVENTIL HFA;VENTOLIN HFA) 108 (90 Base) MCG/ACT inhaler INHALE 2 PUFFS INTO THE LUNGS EVERY 6  HOURS AS NEEDED FOR WHEEZING OR SHORTNESS OF BREATH. 54 g 3  . Alcohol Swabs (B-D SINGLE USE SWABS REGULAR) PADS USE THREE TIMES DAILY 300 each 3  . allopurinol (ZYLOPRIM) 300 MG tablet TAKE ONE TABLET BY MOUTH DAILY 90 tablet 3  . ammonium lactate (LAC-HYDRIN) 12 % lotion APPLY 1 APPLICATION TOPICALLY TWICE DAILY AS NEEDED FOR DRY SKIN (SUBSTITUTED FOR LAC HYDRIN) 400 g 12  . aspirin EC 81 MG tablet Take 81 mg by mouth daily.    [name] atorvastatin (LIPITOR) 40 MG tablet TAKE 1 TABLET EVERY DAY 90 tablet 3  . azelastine (ASTELIN) 0.1 % nasal spray USE 2 SPRAYS IN EACH NOSTRIL EVERY DAY AS NEEDED AS DIRECTED 60 mL 3  . B-D ULTRAFINE III SHORT PEN 31G X 8 MM MISC USE TO INJECT THREE TIMES DAILY 270 each 3  . budesonide-formoterol (SYMBICORT) 160-4.5 MCG/ACT inhaler INHALE 2 PUFFS INTO THE LUNGS 2 (TWO) TIMES DAILY. RINSE, GARGLE, AND SPIT AFTER USE. 3 Inhaler 3  . buPROPion (WELLBUTRIN) 100 MG tablet TAKE 1 TABLET TWICE DAILY 90 tablet 0  . COMBIGAN 0.2-0.5 % ophthalmic solution Place 1 drop into the left eye every 12 (twelve) hours.     . diclofenac sodium (VOLTAREN) 1 % GEL Apply 2 g topically 4 (four) times daily. 100 g 0  . esomeprazole (NEXIUM) 40 MG capsule  TAKE 1 CAPSULE EVERY DAY 90 capsule 3  . ezetimibe (ZETIA) 10 MG tablet Take 1 tablet (10 mg total) by mouth daily. 90 tablet 2  . fluticasone (FLONASE) 50 MCG/ACT nasal spray Place 2 sprays into both nostrils daily. 48 g 3  . furosemide (LASIX) 40 MG tablet Take one tablet by mouth daily 90 tablet 3  . LANTUS SOLOSTAR 100 UNIT/ML Solostar Pen INJECT 35 UNITS INTO THE SKIN 2 (TWO) TIMES DAILY. 60 mL 3  . latanoprost (XALATAN) 0.005 % ophthalmic solution Place 1 drop into both eyes at bedtime.    [name] LINZESS 290 MCG CAPS capsule Take 290 mcg by mouth daily.     [name] losartan (COZAAR) 50 MG tablet Take 2 tablets (100 mg total) by mouth daily. 90 tablet 3  . metoCLOPramide (REGLAN) 5 MG tablet Take 5 mg by mouth 4 (  four) times daily.  10  . metoprolol succinate (TOPROL-XL) 50 MG 24 hr tablet TAKE 3 TABLETS EVERY DAY WITH OR IMMEDIATELY FOLLOWING A MEAL 270 tablet 3  . montelukast (SINGULAIR) 10 MG tablet Take 1 tablet (10 mg total) by mouth daily. 90 tablet 3  . Multiple Vitamins-Minerals (MULTIVITAMIN PO) Take 1 tablet by mouth daily.    . nitroGLYCERIN (NITROSTAT) 0.4 MG SL tablet Place 1 tablet (0.4 mg total) under the tongue every 5 (five) minutes as needed for chest pain. 50 tablet 3  . NOVOLOG FLEXPEN 100 UNIT/ML FlexPen INJECT 50 UNITS INTO THE SKIN 2 (TWO) TIMES DAILY WITH A MEAL. 90 mL 5  . PRODIGY NO CODING BLOOD GLUC test strip USE AS INSTRUCTED TO CHECK BLOOD SUGAR THREE TIMES A DAY 300 each 12  . ranitidine (ZANTAC) 300 MG tablet Take 1 tablet (300 mg total) by mouth at bedtime. 90 tablet 1  . RESTASIS MULTIDOSE 0.05 % ophthalmic emulsion Place 1 drop into both eyes daily as needed.  3  . sertraline (ZOLOFT) 50 MG tablet TAKE 1 TABLET EVERY DAY 30 tablet 0  . spironolactone (ALDACTONE) 25 MG tablet TAKE 1 TABLET EVERY DAY BEFORE SUPPER 90 tablet 3  . traMADol (ULTRAM) 50 MG tablet TAKE 1-2 TABLETS BY MOUTH EVERY 8 HOURS AS NEEDED FOR PAIN. MAX 6 TABLETS PER 24 HOURS 180 tablet 5  . TRULICITY  1.5 ZO/1.0RU SOPN INJECT  1.5MG   INTO  THE  SKIN EVERY WEEK 12 mL 3  . EPINEPHrine 0.3 mg/0.3 mL IJ SOAJ injection Inject 0.3 mLs (0.3 mg total) into the muscle once as needed (for allergic reaction). 1 Device 3   No current facility-administered medications for this visit.    Allergies: Allergies  Allergen Reactions  . Bee Venom Hives and Swelling  . Propoxyphene Hcl Itching  . Amlodipine Besylate Swelling  . Hydrocodone Other (See Comments)    With Vicodin - makes patient "jittery", and "hangover effect - sleepy next day"  . Lisinopril Cough    Changed to ARB  . Metformin And Related Diarrhea    GI distress  . Metronidazole Other (See Comments)    REACTION: "just didn't work; my body never did heal from it"  . Valsartan Other (See Comments)    REACTION:  "sleep more the next day after I took it; it made me real tired"   I reviewed her past medical history, social history, family history, and environmental history and no significant changes have been reported from previous visit on 04/16/2017.  Review of Systems  Constitutional: Negative for appetite change, chills, fever and unexpected weight change.  HENT: Positive for congestion. Negative for rhinorrhea.   Eyes: Negative for itching.  Respiratory: Negative for cough, chest tightness, shortness of breath and wheezing.   Gastrointestinal: Negative for abdominal pain.  Skin: Negative for rash.  Allergic/Immunologic: Positive for environmental allergies. Negative for food allergies.  Neurological: Negative for headaches.   Objective: BP (!) 152/92 (BP Location: Right Arm)   Pulse 72   Resp 16   Ht 5' (1.524 m)   Wt 197 lb (89.4 kg)   SpO2 99%   BMI 38.47 kg/m  Body mass index is 38.47 kg/m. Physical Exam  Constitutional: She is oriented to person, place, and time. She appears well-developed and well-nourished.  HENT:  Head: Normocephalic and atraumatic.  Right Ear: External ear normal.  Left Ear: External ear normal.    Nose: Nose normal.  Mouth/Throat: Oropharynx is clear and moist.  Eyes:  Conjunctivae and EOM are normal.  Neck: Neck supple.  Cardiovascular: Normal rate, regular rhythm and normal heart sounds. Exam reveals no gallop and no friction rub.  No murmur heard. Pulmonary/Chest: Effort normal and breath sounds normal. She has no wheezes. She has no rales.  Lymphadenopathy:    She has no cervical adenopathy.  Neurological: She is alert and oriented to person, place, and time.  Skin: Skin is warm. No rash noted.  Psychiatric: She has a normal mood and affect. Her behavior is normal.  Nursing note and vitals reviewed.  Previous notes and tests were reviewed. The plan was reviewed with the patient/family, and all questions/concerned were addressed.  It was my pleasure to see [name] [name] today and participate in her care. Please feel free to contact me with any questions or concerns.  Sincerely,  Rexene Alberts, DO Allergy & Immunology  [facility] office: [phone] [facility] office: [phone]

## 2018-09-09 NOTE — Assessment & Plan Note (Signed)
Symptoms waxes and wanes.  Continue nexium 40mg  twice a day.   Continue zantac 300mg  daily.

## 2018-09-10 ENCOUNTER — Ambulatory Visit (HOSPITAL_COMMUNITY): Payer: Self-pay | Admitting: Psychiatry

## 2018-09-11 ENCOUNTER — Telehealth: Payer: Self-pay | Admitting: Allergy

## 2018-09-11 MED ORDER — ALBUTEROL SULFATE HFA 108 (90 BASE) MCG/ACT IN AERS: 2.0000 | INHALATION_SPRAY | RESPIRATORY_TRACT | 3 refills | Status: DC | PRN

## 2018-09-11 NOTE — Telephone Encounter (Signed)
Patient states the [facility] wont refill meds due to it being over $200 Told the patient to call to see if she could be switched to [facility]  Patient states she has taken [facility] in the past, but may have been changed due to cost of it  Please call

## 2018-09-11 NOTE — Telephone Encounter (Signed)
ProAir was sent into [facility] as it is covered under patient insurance. Patient notified

## 2018-09-16 ENCOUNTER — Ambulatory Visit (INDEPENDENT_AMBULATORY_CARE_PROVIDER_SITE_OTHER): Payer: Medicare Other

## 2018-09-16 ENCOUNTER — Other Ambulatory Visit: Payer: Self-pay

## 2018-09-16 ENCOUNTER — Ambulatory Visit (INDEPENDENT_AMBULATORY_CARE_PROVIDER_SITE_OTHER): Payer: Medicare Other | Admitting: Licensed Clinical Social Worker

## 2018-09-16 ENCOUNTER — Ambulatory Visit (INDEPENDENT_AMBULATORY_CARE_PROVIDER_SITE_OTHER): Payer: Medicare Other | Admitting: Family Medicine

## 2018-09-16 ENCOUNTER — Encounter: Payer: Self-pay | Admitting: Family Medicine

## 2018-09-16 VITALS — BP 144/90 | HR 62 | Temp 97.9°F | Ht 60.0 in | Wt 198.6 lb

## 2018-09-16 DIAGNOSIS — M79674 Pain in right toe(s): Secondary | ICD-10-CM

## 2018-09-16 DIAGNOSIS — B351 Tinea unguium: Secondary | ICD-10-CM

## 2018-09-16 DIAGNOSIS — I10 Essential (primary) hypertension: Secondary | ICD-10-CM

## 2018-09-16 DIAGNOSIS — E1149 Type 2 diabetes mellitus with other diabetic neurological complication: Secondary | ICD-10-CM | POA: Diagnosis not present

## 2018-09-16 DIAGNOSIS — F339 Major depressive disorder, recurrent, unspecified: Secondary | ICD-10-CM

## 2018-09-16 DIAGNOSIS — Z0000 Encounter for general adult medical examination without abnormal findings: Secondary | ICD-10-CM | POA: Diagnosis not present

## 2018-09-16 DIAGNOSIS — Z139 Encounter for screening, unspecified: Secondary | ICD-10-CM

## 2018-09-16 DIAGNOSIS — M79675 Pain in left toe(s): Secondary | ICD-10-CM

## 2018-09-16 LAB — POCT GLYCOSYLATED HEMOGLOBIN (HGB A1C): HbA1c, POC (controlled diabetic range): 6.6 % (ref 0.0–7.0)

## 2018-09-16 MED ORDER — DULAGLUTIDE 1.5 MG/0.5ML ~~LOC~~ SOAJ: SUBCUTANEOUS | 3 refills | Status: DC

## 2018-09-16 MED ORDER — SPIRONOLACTONE 25 MG PO TABS: ORAL_TABLET | ORAL | 3 refills | Status: DC

## 2018-09-16 MED ORDER — ALLOPURINOL 300 MG PO TABS: ORAL_TABLET | ORAL | 3 refills | Status: DC

## 2018-09-16 MED ORDER — ACYCLOVIR 200 MG PO CAPS: 200.0000 mg | ORAL_CAPSULE | Freq: Two times a day (BID) | ORAL | 3 refills | Status: DC

## 2018-09-16 MED ORDER — BD SWAB SINGLE USE REGULAR PADS: MEDICATED_PAD | 3 refills | Status: DC

## 2018-09-16 MED ORDER — METOPROLOL SUCCINATE ER 50 MG PO TB24: ORAL_TABLET | ORAL | 3 refills | Status: DC

## 2018-09-16 MED ORDER — GLUCOSE BLOOD VI STRP: ORAL_STRIP | 12 refills | Status: DC

## 2018-09-16 MED ORDER — ATORVASTATIN CALCIUM 40 MG PO TABS: ORAL_TABLET | ORAL | 3 refills | Status: DC

## 2018-09-16 MED ORDER — METOCLOPRAMIDE HCL 5 MG PO TABS: 5.0000 mg | ORAL_TABLET | Freq: Four times a day (QID) | ORAL | 3 refills | Status: DC

## 2018-09-16 MED ORDER — ACCU-CHEK FASTCLIX LANCETS MISC: 12 refills | Status: DC

## 2018-09-16 NOTE — BH Specialist Note (Signed)
Type of Service: [place] is a 68 y.o. female: referred by Orson Eva to address patient food insecurities. Patient is currently using [employer] but reports that it is difficult for her to get groceries on and off the bus and down her drive way by herself. Patient is currently receiving assistance from mobile meals and reports being unable to qualify for Food Stamps due to income. Patient reports worrying that she will not have enough food for the month. [facility] intern gave patient a food box after assessing for her needs. Patient feels she may benefit from this box due to financial and health related stressors. 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   Plan: 1. [facility] intern will follow up with patient in 5-7 days to see if the food box is going to be a good fit for her.   Audry Riles, [place] intern Behavioral Health Clinician,  [place] [phone]

## 2018-09-16 NOTE — Progress Notes (Signed)
Subjective:   [name] [name] is a 68 y.o. female who presents for Medicare Annual (Subsequent) preventive examination.  Review of Systems:  Physical assessment deferred to PCP.  Cardiac Risk Factors include: advanced age (>54men, >60 women);diabetes mellitus;dyslipidemia;hypertension;obesity (BMI >30kg/m2);sedentary lifestyle     Objective:     Vitals: BP (!) 144/90   Pulse 62   Temp 97.9 F (36.6 C) (Oral)   Ht 5' (1.524 m)   Wt 198 lb 9.6 oz (90.1 kg)   SpO2 99%   BMI 38.79 kg/m   Body mass index is 38.79 kg/m.  Advanced Directives 09/16/2018 06/03/2018 09/22/2017 06/11/2017 04/30/2017 04/09/2017 01/22/2017  Does Patient Have a Medical Advance Directive? No No No No No No No  Would patient like information on creating a medical advance directive? Yes (MAU/Ambulatory/Procedural Areas - Information given) No - Patient declined No - Patient declined No - Patient declined No - Patient declined No - Patient declined No - Patient declined  Pre-existing out of facility DNR order (yellow form or pink MOST form) - - - - - - -  Some encounter information is confidential and restricted. Go to Review Flowsheets activity to see all data.    Tobacco Social History   Tobacco Use  Smoking Status Former Smoker  . Packs/day: 0.50  . Years: 0.50  . Pack years: 0.25  . Types: Cigarettes  . Last attempt to quit: 09/03/1975  . Years since quitting: 43.0  Smokeless Tobacco Never Used  Tobacco Comment   no plans to start again     Counseling given: No Comment: no plans to start again   Clinical Intake:  Pre-visit preparation completed: Yes  Pain : No/denies pain Pain Score: 0-No pain     Nutritional Status: BMI > 30  Obese Nutritional Risks: None Diabetes: Yes CBG done?: No Did pt. bring in CBG monitor from home?: No  What is the last grade level you completed in school?: 12th grade  Interpreter Needed?: No     Past Medical History:  Diagnosis Date  . Allergy     . Angina   . Anxiety   . Arthritis   . Asthma   . Blood transfusion   . Blood transfusion without reported diagnosis   . Cataract   . Chronic cough    Now followed by pulmonary  . Chronic lower back pain   . Complication of anesthesia    "just wake up coughing; that's all"  . Concussion 11/18/11   "fell at dr's office; hit head"  . COPD (chronic obstructive pulmonary disease) ([place])   . Delayed gastric emptying   . Depression   . Diabetes mellitus type II    "take insulin & pills"  . Dyslipidemia   . Exertional dyspnea   . GERD (gastroesophageal reflux disease)   . Glaucoma   . Gout   . Headache(784.0) 11/18/11   "I've had mild headaches the last couple days"  . Headache(784.0) 01/08/12   "pretty constant since 11/18/11's concussion"  . Heart murmur   . History of colonoscopy   . HTN (hypertension)   . Myocardial infarction ([place])   . Osteoporosis   . Peripheral neuropathy   . Pneumonia    "i've had it once" (11/18/11)  . Sleep apnea    on CPAP 12  . Syncope 06/16/2012  . Vocal cord paralysis, unilateral partial    "right"   Past Surgical History:  Procedure Laterality Date  . ANTERIOR CERVICAL DECOMP/DISCECTOMY FUSION  01/15/2012  Procedure: ANTERIOR CERVICAL DECOMPRESSION/DISCECTOMY FUSION 3 LEVELS;  Surgeon: Eustace Moore, MD;  Location: [facility];  Service: Neurosurgery;  Laterality: N/A;  Anterior Cervical Decompression Discectomy Fusion Cerivcal three four, cervical four five, cervical five six, cervical six seven  . BREAST BIOPSY     bilaterally  . BREAST CYST EXCISION     right twice; left once  . BREAST REDUCTION SURGERY  04-21-2003  . BRONCHOSCOPY  07-2007  . CARDIAC CATHETERIZATION  03-02-2004  . CARPAL TUNNEL RELEASE     left  . CATARACT EXTRACTION  1955; 1979   bilateral; right eye  . CESAREAN SECTION  1987  . COLONOSCOPY W/ BIOPSIES  11/21/2010   adenoma polyp--no hi grade dysplasia Dr Collene Mares  . EYE SURGERY    . KNEE ARTHROSCOPY  08/2000   right  .  LEFT AND RIGHT HEART CATHETERIZATION WITH CORONARY ANGIOGRAM N/A 01/19/2014   Procedure: LEFT AND RIGHT HEART CATHETERIZATION WITH CORONARY ANGIOGRAM;  Surgeon: Sinclair Grooms, MD;  Location: [facility] C[facility];  Service: Cardiovascular;  Laterality: N/A;  . REDUCTION MAMMAPLASTY Bilateral   . SHOULDER ARTHROSCOPY  08/2000   right  . SHOULDER ARTHROSCOPY W/ ROTATOR CUFF REPAIR  10/18/2003   left  . SPINE SURGERY    . T-score  09/02/04   -0.54 (low risk currently)  . TOTAL ABDOMINAL HYSTERECTOMY  10-07-2000  . TREATMENT FISTULA ANAL    . TUBAL LIGATION  1987   Family History  Problem Relation Age of Onset  . Diabetes Father   . Heart disease Mother   . Hypertension Sister   . Hyperlipidemia Sister   . Cancer Sister   . Hypertension Brother   . Hyperlipidemia Brother   . Congestive Heart Failure Brother   . Hypertension Sister   . Colon cancer Neg Hx   . Depression Neg Hx   . Anxiety disorder Neg Hx    Social History   Socioeconomic History  . Marital status: Single    Spouse name: Not on file  . Number of children: 1  . Years of education: 37  . Highest education level: 12th grade  Occupational History  . Occupation: unemployed    Fish farm manager: UNEMPLOYED  Social Needs  . Financial resource strain: Not very hard  . Food insecurity:    Worry: Sometimes true    Inability: Sometimes true  . Transportation needs:    Medical: No    Non-medical: No  Tobacco Use  . Smoking status: Former Smoker    Packs/day: 0.50    Years: 0.50    Pack years: 0.25    Types: Cigarettes    Last attempt to quit: 09/03/1975    Years since quitting: 43.0  . Smokeless tobacco: Never Used  . Tobacco comment: no plans to start again  Substance and Sexual Activity  . Alcohol use: No  . Drug use: No  . Sexual activity: Not Currently  Lifestyle  . Physical activity:    Days per week: 0 days    Minutes per session: 0 min  . Stress: Not at all  Relationships  . Social connections:    Talks on phone:  More than three times a week    Gets together: More than three times a week    Attends religious service: More than 4 times per year    Active member of club or organization: Yes    Attends meetings of clubs or organizations: More than 4 times per year    Relationship status:  Never married  Other Topics Concern  . Not on file  Social History Narrative      Emergency Contact: sister, catherine Gossman [phone]      Who lives with you: Lives alone, in ground level apartment; has smoke alarms. No throw rugs on floor. Has grab bars at tub and toilet.   Any pets: none   Diet: Pt has a varied diet of protein, starch, and vegetables.   Exercise: Pt walks 1x a week with [place] [facility] for visually impaired.   Seatbelts: Pt reports wearing seatbelt when in vehicles.    Hobbies: walking at [employer], church and church activities      Current Social History 06/11/2017        Who lives at home: Patient lives alone in first floor apt without steps 06/11/2017    Transportation: Patient relies on [employer], bus or others to drive [id number]   Important Relationships Son, two sisters, brother and their families 06/11/2017    Pets: None 06/11/2017   Education / Work:  24 th grade/ Volunteers at [employer] 06/11/2017   Interests / Fun: Walk around [place] 06/11/2017   Current Stressors: "I get stressed when I'm not feeling good." 06/11/2017   Religious / Personal Beliefs: "Holiness" 06/11/2017   L. Ducatte, RN, BSN                                                                                                  Outpatient Encounter Medications as of 09/16/2018  Medication Sig  . albuterol (PROAIR HFA) 108 (90 Base) MCG/ACT inhaler Inhale 2 puffs into the lungs every 4 (four) hours as needed for wheezing or shortness of breath.  [name] ammonium lactate (LAC-HYDRIN) 12 % lotion APPLY 1 APPLICATION TOPICALLY TWICE DAILY AS NEEDED FOR DRY SKIN (SUBSTITUTED FOR LAC HYDRIN)  . aspirin EC 81 MG tablet  Take 81 mg by mouth daily.  [name] azelastine (ASTELIN) 0.1 % nasal spray USE 2 SPRAYS IN EACH NOSTRIL EVERY DAY AS NEEDED AS DIRECTED  . B-D ULTRAFINE III SHORT PEN 31G X 8 MM MISC USE TO INJECT THREE TIMES DAILY  . budesonide-formoterol (SYMBICORT) 160-4.5 MCG/ACT inhaler INHALE 2 PUFFS INTO THE LUNGS 2 (TWO) TIMES DAILY. RINSE, GARGLE, AND SPIT AFTER USE.  [name] buPROPion (WELLBUTRIN) 100 MG tablet TAKE 1 TABLET TWICE DAILY  . COMBIGAN 0.2-0.5 % ophthalmic solution Place 1 drop into the left eye every 12 (twelve) hours.   . diclofenac sodium (VOLTAREN) 1 % GEL Apply 2 g topically 4 (four) times daily.  [name] EPINEPHrine 0.3 mg/0.3 mL IJ SOAJ injection Inject 0.3 mLs (0.3 mg total) into the muscle once as needed (for allergic reaction).  [name] esomeprazole (NEXIUM) 40 MG capsule TAKE 1 CAPSULE EVERY DAY  . ezetimibe (ZETIA) 10 MG tablet Take 1 tablet (10 mg total) by mouth daily.  . fluticasone (FLONASE) 50 MCG/ACT nasal spray Place 2 sprays into both nostrils daily.  . furosemide (LASIX) 40 MG tablet Take one tablet by mouth daily  . LANTUS SOLOSTAR 100 UNIT/ML Solostar Pen INJECT 35 UNITS INTO THE SKIN 2 (TWO) TIMES DAILY.  [name]  latanoprost (XALATAN) 0.005 % ophthalmic solution Place 1 drop into both eyes at bedtime.  [name] LINZESS 290 MCG CAPS capsule Take 290 mcg by mouth daily.   [name] losartan (COZAAR) 50 MG tablet Take 2 tablets (100 mg total) by mouth daily.  . montelukast (SINGULAIR) 10 MG tablet Take 1 tablet (10 mg total) by mouth daily.  . Multiple Vitamins-Minerals (MULTIVITAMIN PO) Take 1 tablet by mouth daily.  . nitroGLYCERIN (NITROSTAT) 0.4 MG SL tablet Place 1 tablet (0.4 mg total) under the tongue every 5 (five) minutes as needed for chest pain.  [name] NOVOLOG FLEXPEN 100 UNIT/ML FlexPen INJECT 50 UNITS INTO THE SKIN 2 (TWO) TIMES DAILY WITH A MEAL.  . ranitidine (ZANTAC) 300 MG tablet Take 1 tablet (300 mg total) by mouth at bedtime.  . RESTASIS MULTIDOSE 0.05 % ophthalmic emulsion Place 1 drop into both  eyes daily as needed.  . sertraline (ZOLOFT) 50 MG tablet TAKE 1 TABLET EVERY DAY  . traMADol (ULTRAM) 50 MG tablet TAKE 1-2 TABLETS BY MOUTH EVERY 8 HOURS AS NEEDED FOR PAIN. MAX 6 TABLETS PER 24 HOURS  . [DISCONTINUED] acyclovir (ZOVIRAX) 200 MG capsule Take 1 capsule (200 mg total) by mouth 2 (two) times daily.  . [DISCONTINUED] Alcohol Swabs (B-D SINGLE USE SWABS REGULAR) PADS USE THREE TIMES DAILY  . [DISCONTINUED] allopurinol (ZYLOPRIM) 300 MG tablet TAKE ONE TABLET BY MOUTH DAILY  . [DISCONTINUED] atorvastatin (LIPITOR) 40 MG tablet TAKE 1 TABLET EVERY DAY  . [DISCONTINUED] metoCLOPramide (REGLAN) 5 MG tablet Take 5 mg by mouth 4 (four) times daily.  . [DISCONTINUED] metoprolol succinate (TOPROL-XL) 50 MG 24 hr tablet TAKE 3 TABLETS EVERY DAY WITH OR IMMEDIATELY FOLLOWING A MEAL  . [DISCONTINUED] PRODIGY NO CODING BLOOD GLUC test strip USE AS INSTRUCTED TO CHECK BLOOD SUGAR THREE TIMES A DAY  . [DISCONTINUED] spironolactone (ALDACTONE) 25 MG tablet TAKE 1 TABLET EVERY DAY BEFORE SUPPER  . [DISCONTINUED] TRULICITY 1.5 TD/3.2KG SOPN INJECT  1.5MG   INTO  THE  SKIN EVERY WEEK  . [DISCONTINUED] ACCU-CHEK FASTCLIX LANCETS MISC TEST THREE TIMES DAILY (Patient not taking: Reported on 09/16/2018)   No facility-administered encounter medications on file as of 09/16/2018.     Activities of Daily Living In your present state of health, do you have any difficulty performing the following activities: 09/16/2018  Hearing? Y  Comment Has significant loss in both ears; considering hearing aids  Vision? Y  Comment Vision is 20/200 with glasses; has glaucoma  Difficulty concentrating or making decisions? N  Walking or climbing stairs? Y  Dressing or bathing? N  Doing errands, shopping? N  Preparing Food and eating ? N  Using the Toilet? N  In the past six months, have you accidently leaked urine? N  Do you have problems with loss of bowel control? N  Managing your Medications? N  Managing your  Finances? N  Housekeeping or managing your Housekeeping? N  Some recent data might be hidden    Patient Care Team: Dickie La, MD as PCP - General Monna Fam, MD (Ophthalmology) Juanita Craver, MD as Consulting Physician (Gastroenterology) Fay Records, MD as Consulting Physician (Cardiology) Kennith Center, RD as Dietitian (Family Medicine) Deneise Lever, MD as Consulting Physician (Pulmonary Disease) Edrick Kins, DPM as Consulting Physician (Podiatry) Jackalyn Lombard, DMD (Dentistry) Leavy Cella, [facility] (Pharmacist) Garnet Sierras, DO as Consulting Physician (Allergy) Charlcie Cradle, MD (Psychiatry) Orie Rout, MD as Referring Physician (Specialist)    Assessment:   This is  a routine wellness examination for [facility].  Exercise Activities and Dietary recommendations Current Exercise Habits: The patient does not participate in regular exercise at present, Exercise limited by: Other - see comments  Goals      Patient Stated   . Weight < 186 lb (84.4 kg) (pt-stated)     7% weight loss      Other   . Exercise 2x per week (15-30 min per time)       Fall Risk Fall Risk  09/16/2018 09/16/2018 06/03/2018 02/25/2018 09/22/2017  Falls in the past year? 1 1 No No No  Number falls in past yr: 0 0 - - -  Comment - - - - -  Injury with Fall? 0 0 - - -  Comment - - - - -  Risk Factor Category  - - - - -  Comment - - - - -  Risk for fall due to : - Impaired vision - - -  Follow up - Education provided - - -   Is the patient's home free of loose throw rugs in walkways, pet beds, electrical cords, etc?   yes      Grab bars in the bathroom? yes      Handrails on the stairs?   yes      Adequate lighting?   yes   Depression Screen PHQ 2/9 Scores 09/16/2018 09/16/2018 06/03/2018 02/25/2018  PHQ - 2 Score 0 0 0 0     Cognitive Function MMSE - Mini Mental State Exam 09/16/2018 05/27/2012  Orientation to time 5 5  Orientation to Place 5 5  Registration 3 3  Attention/  Calculation 5 5  Recall 3 3  Language- name 2 objects 2 2  Language- repeat 1 1  Language- follow 3 step command 3 3  Language- read & follow direction 1 1  Write a sentence 1 1  Copy design 1 1  Total score 30 30     6CIT Screen 09/16/2018  What Year? 0 points  What month? 0 points  What time? 0 points  Count back from 20 0 points  Months in reverse 0 points  Repeat phrase 0 points  Total Score 0    Immunization History  Administered Date(s) Administered  . H1N1 08/11/2008  . Influenza Split 05/17/2011, 05/27/2012  . Influenza Whole 05/25/2007, 06/08/2008, 06/02/2009, 06/05/2010  . Influenza,inj,Quad PF,6+ Mos 04/28/2013, 06/01/2014, 05/18/2015, 05/17/2016, 04/30/2017, 06/03/2018  . Pneumococcal Conjugate-13 10/20/2013  . Pneumococcal Polysaccharide-23 06/02/1993, 06/17/2012, 03/19/2018  . Td 10/31/1996, 03/02/2008     Screening Tests Health Maintenance  Topic Date Due  . FOOT EXAM  06/18/2018  . TETANUS/TDAP  09/17/2019 (Originally 03/02/2018)  . HEMOGLOBIN A1C  12/03/2018  . OPHTHALMOLOGY EXAM  06/03/2019  . MAMMOGRAM  08/21/2019  . COLONOSCOPY  03/15/2027  . INFLUENZA VACCINE  Completed  . DEXA SCAN  Completed  . Hepatitis C Screening  Completed  . PNA vac Low Risk Adult  Completed    Cancer Screenings: Lung: Low Dose CT Chest recommended if Age 65-80 years, 30 pack-year currently smoking OR have quit w/in 15years. Patient does not qualify. Breast:  Up to date on Mammogram? Yes   Up to date of Bone Density/Dexa? Yes Colorectal: up to date  Additional Screenings:  Hepatitis C Screening: complete     Plan:    Patient with food insecurities and is legally blind. Consulted with [place] and Casimer Lanius, LCSW, regarding food box program due to chronic health conditions and visual impairment making  travel to food pantries difficult. Patient due for f/u with PCP and appointment available today while patient is here.   I have personally reviewed and noted the  following in the patient's chart:   . Medical and social history . Use of alcohol, tobacco or illicit drugs  . Current medications and supplements . Functional ability and status . Nutritional status . Physical activity . Advanced directives . List of other physicians . Hospitalizations, surgeries, and ER visits in previous 12 months . Vitals . Screenings to include cognitive, depression, and falls . Referrals and appointments  In addition, I have reviewed and discussed with patient certain preventive protocols, quality metrics, and best practice recommendations. A written personalized care plan for preventive services as well as general preventive health recommendations were provided to patient.     Esau Grew, RN  09/16/2018

## 2018-09-16 NOTE — Addendum Note (Signed)
Addended by: Casimer Lanius H on: 09/16/2018 01:22 PM   Modules accepted: Level of Service

## 2018-09-16 NOTE — Patient Instructions (Addendum)
Ms. Jarnagin , Thank you for taking time to come for your Medicare Wellness Visit. I appreciate your ongoing commitment to your health goals. Please review the following plan we discussed and let me know if I can assist you in the future.   These are the goals we discussed: Goals      Patient Stated   . Weight < 186 lb (84.4 kg) (pt-stated)     7% weight loss      Other   . Exercise 2x per week (15-30 min per time)       This is a list of the screening recommended for you and due dates:  Health Maintenance  Topic Date Due  . Complete foot exam   06/18/2018  . Tetanus Vaccine  09/17/2019*  . Hemoglobin A1C  12/03/2018  . Eye exam for diabetics  06/03/2019  . Mammogram  08/21/2019  . Colon Cancer Screening  03/15/2027  . Flu Shot  Completed  . DEXA scan (bone density measurement)  Completed  .  Hepatitis C: One time screening is recommended by Center for Disease Control  (CDC) for  adults born from [birth year] through 1964.   Completed  . Pneumonia vaccines  Completed  *Topic was postponed. The date shown is not the original due date.     Fall Prevention in the Home, Adult Falls can cause injuries. They can happen to people of all ages. There are many things you can do to make your home safe and to help prevent falls. Ask for help when making these changes, if needed. What actions can I take to prevent falls? General Instructions  Use good lighting in all rooms. Replace any light bulbs that burn out.  Turn on the lights when you go into a dark area. Use night-lights.  Keep items that you use often in easy-to-reach places. Lower the shelves around your home if necessary.  Set up your furniture so you have a clear path. Avoid moving your furniture around.  Do not have throw rugs and other things on the floor that can make you trip.  Avoid walking on wet floors.  If any of your floors are uneven, fix them.  Add color or contrast paint or tape to clearly mark and help you  see: ? Any grab bars or handrails. ? First and last steps of stairways. ? Where the edge of each step is.  If you use a stepladder: ? Make sure that it is fully opened. Do not climb a closed stepladder. ? Make sure that both sides of the stepladder are locked into place. ? Ask someone to hold the stepladder for you while you use it.  If there are any pets around you, be aware of where they are. What can I do in the bathroom?      Keep the floor dry. Clean up any water that spills onto the floor as soon as it happens.  Remove soap buildup in the tub or shower regularly.  Use non-skid mats or decals on the floor of the tub or shower.  Attach bath mats securely with double-sided, non-slip rug tape.  If you need to sit down in the shower, use a plastic, non-slip stool.  Install grab bars by the toilet and in the tub and shower. Do not use towel bars as grab bars. What can I do in the bedroom?  Make sure that you have a light by your bed that is easy to reach.  Do not use any sheets  or blankets that are too big for your bed. They should not hang down onto the floor.  Have a firm chair that has side arms. You can use this for support while you get dressed. What can I do in the kitchen?  Clean up any spills right away.  If you need to reach something above you, use a strong step stool that has a grab bar.  Keep electrical cords out of the way.  Do not use floor polish or wax that makes floors slippery. If you must use wax, use non-skid floor wax. What can I do with my stairs?  Do not leave any items on the stairs.  Make sure that you have a light switch at the top of the stairs and the bottom of the stairs. If you do not have them, ask someone to add them for you.  Make sure that there are handrails on both sides of the stairs, and use them. Fix handrails that are broken or loose. Make sure that handrails are as long as the stairways.  Install non-slip stair treads on all  stairs in your home.  Avoid having throw rugs at the top or bottom of the stairs. If you do have throw rugs, attach them to the floor with carpet tape.  Choose a carpet that does not hide the edge of the steps on the stairway.  Check any carpeting to make sure that it is firmly attached to the stairs. Fix any carpet that is loose or worn. What can I do on the outside of my home?  Use bright outdoor lighting.  Regularly fix the edges of walkways and driveways and fix any cracks.  Remove anything that might make you trip as you walk through a door, such as a raised step or threshold.  Trim any bushes or trees on the path to your home.  Regularly check to see if handrails are loose or broken. Make sure that both sides of any steps have handrails.  Install guardrails along the edges of any raised decks and porches.  Clear walking paths of anything that might make someone trip, such as tools or rocks.  Have any leaves, snow, or ice cleared regularly.  Use sand or salt on walking paths during winter.  Clean up any spills in your garage right away. This includes grease or oil spills. What other actions can I take?  Wear shoes that: ? Have a low heel. Do not wear high heels. ? Have rubber bottoms. ? Are comfortable and fit you well. ? Are closed at the toe. Do not wear open-toe sandals.  Use tools that help you move around (mobility aids) if they are needed. These include: ? Canes. ? Walkers. ? Scooters. ? Crutches.  Review your medicines with your doctor. Some medicines can make you feel dizzy. This can increase your chance of falling. Ask your doctor what other things you can do to help prevent falls. Where to find more information  Centers for Disease Control and Prevention, STEADI: https://garcia.biz/  [employer] on Aging: BrainJudge.co.uk Contact a doctor if:  You are afraid of falling at home.  You feel weak, drowsy, or dizzy at home.  You fall at  home. Summary  There are many simple things that you can do to make your home safe and to help prevent falls.  Ways to make your home safe include removing tripping hazards and installing grab bars in the bathroom.  Ask for help when making these changes in your  home. This information is not intended to replace advice given to you by your health care provider. Make sure you discuss any questions you have with your health care provider. Document Released: 06/15/2009 Document Revised: 04/03/2017 Document Reviewed: 04/03/2017 Elsevier Interactive Patient Education  2019 [employer].

## 2018-09-17 ENCOUNTER — Telehealth: Payer: Self-pay | Admitting: *Deleted

## 2018-09-17 MED ORDER — FAMOTIDINE 40 MG PO TABS: 40.0000 mg | ORAL_TABLET | Freq: Every day | ORAL | 1 refills | Status: DC

## 2018-09-17 NOTE — Telephone Encounter (Signed)
Pt called back and I informed her to take Pepcid 40mg  and I will send it into her mail order pharmacy. [facility]

## 2018-09-17 NOTE — Telephone Encounter (Signed)
Called and left pt message to call back.

## 2018-09-17 NOTE — Telephone Encounter (Signed)
Patient called stating she called the mail order pharmacy and the zantac gernic brand they are backed up on, patient wants to know what else can she take in the place of the zantac. Please advise [phone]

## 2018-09-18 DIAGNOSIS — M79675 Pain in left toe(s): Secondary | ICD-10-CM

## 2018-09-18 DIAGNOSIS — B351 Tinea unguium: Secondary | ICD-10-CM | POA: Insufficient documentation

## 2018-09-18 DIAGNOSIS — M79674 Pain in right toe(s): Secondary | ICD-10-CM | POA: Insufficient documentation

## 2018-09-18 NOTE — Assessment & Plan Note (Signed)
Trimmed all of her nails today.

## 2018-09-18 NOTE — Assessment & Plan Note (Signed)
Good control.  No medication changes.  Recheck labs at next office visit

## 2018-09-18 NOTE — Assessment & Plan Note (Signed)
Seems to be doing really well right now.  She continues to follow-up with psychiatry and therapist.

## 2018-09-18 NOTE — Progress Notes (Signed)
    CHIEF COMPLAINT / HPI: Diabetes mellitus: Here for blood work.  She has been doing pretty well with her medicines taking it regularly, no episodes of low blood sugar.  Not watching her diet.  No unusual frequency of urination.  No weight change 2.  Toe pain.  Toenails being too long and cutting into her toes.  Cannot afford to get the podiatrist right now. 3.  Follow-up hypertension: No chest pain.  No lower extremity edema.  Exercise tolerance is baseline.  Taking her medicines regularly 4.  Follow-up depression: Doing pretty well.  Holidays were uneventful.  Stable.  Medications problems.  REVIEW OF SYSTEMS: See HPI  PERTINENT  PMH / PSH: I have reviewed the patient's medications, allergies, past medical and surgical history, smoking status and updated in the EMR as appropriate.   OBJECTIVE:  Vital signs reviewed. GENERAL: Well-developed, well-nourished, no acute distress. CARDIOVASCULAR: Regular rate and rhythm no murmur gallop or rub LUNGS: Clear to auscultation bilaterally, no rales or wheeze. ABDOMEN: Soft positive bowel sounds NEURO: Resting nystagmus of eyes.  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 MSK: Movement of extremity x 4. nails: Dysmorphic thickened all toenails particularly large.    ASSESSMENT / PLAN:  No problem-specific Assessment & Plan notes found for this encounter.

## 2018-09-18 NOTE — Assessment & Plan Note (Signed)
Good A1c today.  No medication changes.  Continue to follow her diet even though her A1c looks good.

## 2018-09-22 ENCOUNTER — Encounter: Payer: Self-pay | Admitting: Cardiology

## 2018-09-22 NOTE — Progress Notes (Signed)
I have reviewed data from [place] and agree with assessment and plan.

## 2018-09-25 ENCOUNTER — Telehealth: Payer: Self-pay

## 2018-09-25 ENCOUNTER — Other Ambulatory Visit: Payer: Self-pay | Admitting: *Deleted

## 2018-09-25 ENCOUNTER — Telehealth: Payer: Self-pay | Admitting: *Deleted

## 2018-09-25 DIAGNOSIS — E1149 Type 2 diabetes mellitus with other diabetic neurological complication: Secondary | ICD-10-CM

## 2018-09-25 NOTE — Telephone Encounter (Signed)
Patient called nurse line stating [facility] price for Prodigy No Coding test strips are very expensive and she needs these d/t using talking meter.   Will attempt PA due to legal blindness. Clinical questions submitted via Cover My Meds.  Waiting on response, could take up to 72 hours.  Cover My Meds info: Key: [id number]  Danley Danker, RN [facility] RN)

## 2018-09-25 NOTE — Telephone Encounter (Signed)
Per CoverMyMeds, prodigy no coding test strips are approved although may be subject to a copay. Patient informed and she will call [facility]. She will let us know if cost still too much and possibly PCP can call for a cost reduction.  Danley Danker, RN [facility] RN)

## 2018-09-25 NOTE — Telephone Encounter (Signed)
Patient needs a refill on her novolog sent to [facility].    Upon reviewing dosage with her, we discovered that she has only been taking 35 units of the novolog BID.  Her sugars have been running between 80 - 200.  Advised to continue @ 35units and I would check with MD.  Clinton Sawyer, [name], [place]

## 2018-09-29 MED ORDER — LATANOPROST 0.005 % OP SOLN: 1.0000 [drp] | Freq: Every day | OPHTHALMIC | 3 refills | Status: AC

## 2018-09-29 MED ORDER — INSULIN ASPART 100 UNIT/ML FLEXPEN: 35.0000 [IU] | PEN_INJECTOR | Freq: Two times a day (BID) | SUBCUTANEOUS | 5 refills | Status: DC

## 2018-09-29 MED ORDER — COMBIGAN 0.2-0.5 % OP SOLN: 1.0000 [drp] | Freq: Two times a day (BID) | OPHTHALMIC | 3 refills | Status: DC

## 2018-09-29 MED ORDER — INSULIN GLARGINE 100 UNIT/ML SOLOSTAR PEN: PEN_INJECTOR | SUBCUTANEOUS | 3 refills | Status: DC

## 2018-09-29 NOTE — Telephone Encounter (Signed)
[facility] Team [facility] for checking her dose. Her last A1C was GREAT so whatever she is doing is working Please tell her to stay on same doe and I will send innew rx THANKS! [name]

## 2018-09-30 ENCOUNTER — Telehealth: Payer: Self-pay

## 2018-09-30 NOTE — Telephone Encounter (Signed)
Fax from [facility] that Novolog not covered on plan. Request alternative of Humalog be sent.  Danley Danker, RN [facility] RN)

## 2018-09-30 NOTE — Telephone Encounter (Signed)
Left detailed message on number listed on DPR.  Instructed to call back if she has any questions. Katharina Caper, April D, [place]

## 2018-10-01 ENCOUNTER — Ambulatory Visit (INDEPENDENT_AMBULATORY_CARE_PROVIDER_SITE_OTHER): Payer: Medicare Other | Admitting: Psychiatry

## 2018-10-01 ENCOUNTER — Encounter (HOSPITAL_COMMUNITY): Payer: Self-pay | Admitting: Psychiatry

## 2018-10-01 ENCOUNTER — Ambulatory Visit
Admission: RE | Admit: 2018-10-01 | Discharge: 2018-10-01 | Disposition: A | Discharge status: Home / Self Care | Payer: Medicare Other | Source: Ambulatory Visit | Attending: Family Medicine | Admitting: Family Medicine

## 2018-10-01 DIAGNOSIS — Z1231 Encounter for screening mammogram for malignant neoplasm of breast: Secondary | ICD-10-CM | POA: Diagnosis not present

## 2018-10-01 DIAGNOSIS — F411 Generalized anxiety disorder: Secondary | ICD-10-CM

## 2018-10-01 DIAGNOSIS — F330 Major depressive disorder, recurrent, mild: Secondary | ICD-10-CM

## 2018-10-01 MED ORDER — BUPROPION HCL 100 MG PO TABS: 100.0000 mg | ORAL_TABLET | Freq: Two times a day (BID) | ORAL | 1 refills | Status: DC

## 2018-10-01 MED ORDER — SERTRALINE HCL 50 MG PO TABS: 50.0000 mg | ORAL_TABLET | Freq: Every day | ORAL | 1 refills | Status: DC

## 2018-10-01 NOTE — Progress Notes (Signed)
BH MD/PA/NP OP Progress Note  10/01/2018 1:42 PM [name] [name]  MRN:  [id number]  Chief Complaint:  Chief Complaint    Follow-up; Medication Refill     HPI: Overall patient has been doing well.  She missed her last appointment because she was overscheduled on that day with 2 other medical appointments.  She states the holidays were good.  Since I last saw her she has had about 2 or 3 days where she felt down but it was nothing serious.  Her depression is not noticeable most days.  She remains active.  She is denying isolation and hopelessness.  She denies SI/HI.  She is future oriented and is trying to enjoy life.  [name] [name] is very excited as next month she will be getting her hearing aids.  Her sleep remains poor most nights.  Due to continuous ringing in her ears she often finds it difficult to fall asleep.  She is using her CPAP machine.  Anxiety is overall manageable.  She is taking her medication as prescribed and is denying side effects  Visit Diagnosis:    ICD-10-CM   1. GAD (generalized anxiety disorder) F41.1 buPROPion (WELLBUTRIN) 100 MG tablet    sertraline (ZOLOFT) 50 MG tablet  2. Mild episode of recurrent major depressive disorder (HCC) F33.0 buPROPion (WELLBUTRIN) 100 MG tablet    sertraline (ZOLOFT) 50 MG tablet    Past Psychiatric History: Anxiety:Yes Bipolar Disorder:No Depression:Yes Mania:No Psychosis:No Schizophrenia:No Personality Disorder:No Hospitalization for psychiatric illness:No History of Electroconvulsive Shock Therapy:No Prior Suicide Attempts:No    Past Medical History:  Past Medical History:  Diagnosis Date  . Allergy   . Angina   . Anxiety   . Arthritis   . Asthma   . Blood transfusion   . Blood transfusion without reported diagnosis   . Cataract   . Chronic cough    Now followed by pulmonary  . Chronic lower back pain   . Complication of anesthesia    "just wake up coughing; that's all"  . Concussion 11/18/11   "fell at dr's office; hit head"  . COPD (chronic obstructive pulmonary disease) ([place])   . Delayed gastric emptying   . Depression   . Diabetes mellitus type II    "take insulin & pills"  . Dyslipidemia   . Exertional dyspnea   . GERD (gastroesophageal reflux disease)   . Glaucoma   . Gout   . Headache(784.0) 11/18/11   "I've had mild headaches the last couple days"  . Headache(784.0) 01/08/12   "pretty constant since 11/18/11's concussion"  . Heart murmur   . History of colonoscopy   . HTN (hypertension)   . Myocardial infarction ([place])   . Osteoporosis   . Peripheral neuropathy   . Pneumonia    "i've had it once" (11/18/11)  . Sleep apnea    on CPAP 12  . Syncope 06/16/2012  . Vocal cord paralysis, unilateral partial    "right"    Past Surgical History:  Procedure Laterality Date  . ANTERIOR CERVICAL DECOMP/DISCECTOMY FUSION  01/15/2012   Procedure: ANTERIOR CERVICAL DECOMPRESSION/DISCECTOMY FUSION 3 LEVELS;  Surgeon: Eustace Moore, MD;  Location: [facility];  Service: Neurosurgery;  Laterality: N/A;  Anterior Cervical Decompression Discectomy Fusion Cerivcal three four, cervical four five, cervical five six, cervical six seven  . BREAST BIOPSY     bilaterally  . BREAST CYST EXCISION     right twice; left once  . BREAST REDUCTION SURGERY  04-21-2003  . BRONCHOSCOPY  07-2007  . CARDIAC CATHETERIZATION  03-02-2004  . CARPAL TUNNEL RELEASE     left  . CATARACT EXTRACTION  1955; 1979   bilateral; right eye  . CESAREAN SECTION  1987  . COLONOSCOPY W/ BIOPSIES  11/21/2010   adenoma polyp--no hi grade dysplasia Dr Collene Mares  . EYE SURGERY    . KNEE ARTHROSCOPY  08/2000   right  . LEFT AND RIGHT HEART CATHETERIZATION WITH CORONARY ANGIOGRAM N/A 01/19/2014   Procedure: LEFT AND RIGHT HEART CATHETERIZATION WITH CORONARY ANGIOGRAM;  Surgeon: Sinclair Grooms, MD;  Location: [facility] CATH LAB;  Service: Cardiovascular;  Laterality: N/A;  . REDUCTION MAMMAPLASTY Bilateral   . SHOULDER  ARTHROSCOPY  08/2000   right  . SHOULDER ARTHROSCOPY W/ ROTATOR CUFF REPAIR  10/18/2003   left  . SPINE SURGERY    . T-score  09/02/04   -0.54 (low risk currently)  . TOTAL ABDOMINAL HYSTERECTOMY  10-07-2000  . TREATMENT FISTULA ANAL    . TUBAL LIGATION  1987    Family Psychiatric History:  Family History  Problem Relation Age of Onset  . Diabetes Father   . Heart disease Mother   . Hypertension Sister   . Hyperlipidemia Sister   . Cancer Sister   . Hypertension Brother   . Hyperlipidemia Brother   . Congestive Heart Failure Brother   . Hypertension Sister   . Colon cancer Neg Hx   . Depression Neg Hx   . Anxiety disorder Neg Hx     Social History:  Social History   Socioeconomic History  . Marital status: Single    Spouse name: Not on file  . Number of children: 1  . Years of education: 30  . Highest education level: 12th grade  Occupational History  . Occupation: unemployed    Fish farm manager: UNEMPLOYED  Social Needs  . Financial resource strain: Not very hard  . Food insecurity:    Worry: Sometimes true    Inability: Sometimes true  . Transportation needs:    Medical: No    Non-medical: No  Tobacco Use  . Smoking status: Former Smoker    Packs/day: 0.50    Years: 0.50    Pack years: 0.25    Types: Cigarettes    Last attempt to quit: 09/03/1975    Years since quitting: 43.1  . Smokeless tobacco: Never Used  . Tobacco comment: no plans to start again  Substance and Sexual Activity  . Alcohol use: No  . Drug use: No  . Sexual activity: Not Currently  Lifestyle  . Physical activity:    Days per week: 0 days    Minutes per session: 0 min  . Stress: Not at all  Relationships  . Social connections:    Talks on phone: More than three times a week    Gets together: More than three times a week    Attends religious service: More than 4 times per year    Active member of club or organization: Yes    Attends meetings of clubs or organizations: More than 4 times per  year    Relationship status: Never married  Other Topics Concern  . Not on file  Social History Narrative      Emergency Contact: sister, catherine Keenum [phone]      Who lives with you: Lives alone, in ground level apartment; has smoke alarms. No throw rugs on floor. Has grab bars at tub and toilet.   Any pets: none   Diet: Pt has  a varied diet of protein, starch, and vegetables.   Exercise: Pt walks 1x a week with [place] [employer] group for visually impaired.   Seatbelts: Pt reports wearing seatbelt when in vehicles.    Hobbies: walking at [employer], church and church activities      Current Social History 06/11/2017        Who lives at home: Patient lives alone in first floor apt without steps 06/11/2017    Transportation: Patient relies on [employer], bus or others to drive [id number]   Important Relationships Son, two sisters, brother and their families 06/11/2017    Pets: None 06/11/2017   Education / Work:  59 th grade/ Volunteers at [employer] 06/11/2017   Interests / Fun: Walk around [place] 06/11/2017   Current Stressors: "I get stressed when I'm not feeling good." 06/11/2017   Religious / Personal Beliefs: "Holiness" 06/11/2017   L. Ducatte, RN, BSN                                                                                                  Allergies:  Allergies  Allergen Reactions  . Bee Venom Hives and Swelling  . Propoxyphene Hcl Itching  . Amlodipine Besylate Swelling  . Hydrocodone Other (See Comments)    With Vicodin - makes patient "jittery", and "hangover effect - sleepy next day"  . Lisinopril Cough    Changed to ARB  . Metformin And Related Diarrhea    GI distress  . Metronidazole Other (See Comments)    REACTION: "just didn't work; my body never did heal from it"  . Valsartan Other (See Comments)    REACTION:  "sleep more the next day after I took it; it made me real tired"    Metabolic Disorder Labs: Lab Results  Component Value Date    HGBA1C 6.6 09/16/2018   MPG 134 (H) 06/17/2012   No results found for: PROLACTIN Lab Results  Component Value Date   CHOL 121 11/27/2016   TRIG 99 11/27/2016   HDL 28 (L) 11/27/2016   CHOLHDL 4.3 11/27/2016   VLDL 16 10/20/2015   LDLCALC 73 11/27/2016   LDLCALC 72 10/20/2015   Lab Results  Component Value Date   TSH 1.570 06/03/2018   TSH 3.350 01/22/2017    Therapeutic Level Labs: No results found for: LITHIUM No results found for: VALPROATE No components found for:  CBMZ  Current Medications: Current Outpatient Medications  Medication Sig Dispense Refill  . ACCU-CHEK FASTCLIX LANCETS MISC TEST THREE TIMES DAILY 306 each 12  . acyclovir (ZOVIRAX) 200 MG capsule Take 1 capsule (200 mg total) by mouth 2 (two) times daily. 180 capsule 3  . albuterol (PROAIR HFA) 108 (90 Base) MCG/ACT inhaler Inhale 2 puffs into the lungs every 4 (four) hours as needed for wheezing or shortness of breath. 3 Inhaler 3  . Alcohol Swabs (B-D SINGLE USE SWABS REGULAR) PADS USE THREE TIMES DAILY 300 each 3  . allopurinol (ZYLOPRIM) 300 MG tablet TAKE ONE TABLET BY MOUTH DAILY 90 tablet 3  . ammonium lactate (LAC-HYDRIN) 12 % lotion APPLY 1 APPLICATION TOPICALLY TWICE  DAILY AS NEEDED FOR DRY SKIN (SUBSTITUTED FOR LAC HYDRIN) 400 g 12  . aspirin EC 81 MG tablet Take 81 mg by mouth daily.    [name] atorvastatin (LIPITOR) 40 MG tablet TAKE 1 TABLET EVERY DAY 90 tablet 3  . azelastine (ASTELIN) 0.1 % nasal spray USE 2 SPRAYS IN EACH NOSTRIL EVERY DAY AS NEEDED AS DIRECTED 60 mL 3  . B-D ULTRAFINE III SHORT PEN 31G X 8 MM MISC USE TO INJECT THREE TIMES DAILY 270 each 3  . budesonide-formoterol (SYMBICORT) 160-4.5 MCG/ACT inhaler INHALE 2 PUFFS INTO THE LUNGS 2 (TWO) TIMES DAILY. RINSE, GARGLE, AND SPIT AFTER USE. 3 Inhaler 3  . buPROPion (WELLBUTRIN) 100 MG tablet Take 1 tablet (100 mg total) by mouth 2 (two) times daily. 180 tablet 1  . COMBIGAN 0.2-0.5 % ophthalmic solution Place 1 drop into the left eye every  12 (twelve) hours. 1 Bottle 3  . diclofenac sodium (VOLTAREN) 1 % GEL Apply 2 g topically 4 (four) times daily. 100 g 0  . Dulaglutide (TRULICITY) 1.5 YJ/8.5UD SOPN INJECT  1.5MG   INTO  THE  SKIN EVERY WEEK 12 mL 3  . EPINEPHrine 0.3 mg/0.3 mL IJ SOAJ injection Inject 0.3 mLs (0.3 mg total) into the muscle once as needed (for allergic reaction). 1 Device 3  . esomeprazole (NEXIUM) 40 MG capsule TAKE 1 CAPSULE EVERY DAY 90 capsule 3  . ezetimibe (ZETIA) 10 MG tablet Take 1 tablet (10 mg total) by mouth daily. 90 tablet 2  . famotidine (PEPCID) 40 MG tablet Take 1 tablet (40 mg total) by mouth daily. 90 tablet 1  . fluticasone (FLONASE) 50 MCG/ACT nasal spray Place 2 sprays into both nostrils daily. 48 g 3  . furosemide (LASIX) 40 MG tablet Take one tablet by mouth daily 90 tablet 3  . glucose blood (PRODIGY NO CODING BLOOD GLUC) test strip USE AS INSTRUCTED TO CHECK BLOOD SUGAR THREE TIMES A DAY 300 each 12  . insulin aspart (NOVOLOG FLEXPEN) 100 UNIT/ML FlexPen Inject 35 Units into the skin 2 (two) times daily with a meal. 90 mL 5  . Insulin Glargine (LANTUS SOLOSTAR) 100 UNIT/ML Solostar Pen INJECT 35 UNITS INTO THE SKIN 2 (TWO) TIMES DAILY. 60 mL 3  . latanoprost (XALATAN) 0.005 % ophthalmic solution Place 1 drop into both eyes at bedtime. 2.5 mL 3  . LINZESS 290 MCG CAPS capsule Take 290 mcg by mouth daily.     [name] losartan (COZAAR) 50 MG tablet Take 2 tablets (100 mg total) by mouth daily. 90 tablet 3  . metoCLOPramide (REGLAN) 5 MG tablet Take 1 tablet (5 mg total) by mouth 4 (four) times daily. 360 tablet 3  . metoprolol succinate (TOPROL-XL) 50 MG 24 hr tablet TAKE 3 TABLETS EVERY DAY WITH OR IMMEDIATELY FOLLOWING A MEAL 270 tablet 3  . montelukast (SINGULAIR) 10 MG tablet Take 1 tablet (10 mg total) by mouth daily. 90 tablet 3  . Multiple Vitamins-Minerals (MULTIVITAMIN PO) Take 1 tablet by mouth daily.    . nitroGLYCERIN (NITROSTAT) 0.4 MG SL tablet Place 1 tablet (0.4 mg total) under the  tongue every 5 (five) minutes as needed for chest pain. 50 tablet 3  . ranitidine (ZANTAC) 300 MG tablet Take 1 tablet (300 mg total) by mouth at bedtime. 90 tablet 1  . RESTASIS MULTIDOSE 0.05 % ophthalmic emulsion Place 1 drop into both eyes daily as needed.  3  . sertraline (ZOLOFT) 50 MG tablet Take 1 tablet (50 mg total) by  mouth daily. 90 tablet 1  . spironolactone (ALDACTONE) 25 MG tablet TAKE 1 TABLET EVERY DAY BEFORE SUPPER 90 tablet 3  . traMADol (ULTRAM) 50 MG tablet TAKE 1-2 TABLETS BY MOUTH EVERY 8 HOURS AS NEEDED FOR PAIN. MAX 6 TABLETS PER 24 HOURS 180 tablet 5   No current facility-administered medications for this visit.      Musculoskeletal: Strength & Muscle Tone: within normal limits Gait & Station: walking with cane Patient leans: N/A  Psychiatric Specialty Exam: Review of Systems  Constitutional: Negative for chills, diaphoresis and fever.  Neurological: Positive for tingling and headaches. Negative for dizziness and tremors.    Blood pressure (!) 146/66, pulse 62, height 5' (1.524 m), weight 195 lb (88.5 kg), SpO2 97 %.Body mass index is 38.08 kg/m.  General Appearance: Fairly Groomed  Eye Contact:  minimal- pt is legally blind and wears thick glasses  Speech:  Clear and Coherent and Normal Rate  Volume:  Normal  Mood:  Euthymic  Affect:  Full Range  Thought Process:  Goal Directed and Descriptions of Associations: Intact  Orientation:  Full (Time, Place, and Person)  Thought Content:  Logical  Suicidal Thoughts:  No  Homicidal Thoughts:  No  Memory:  Immediate;   Good  Judgement:  Good  Insight:  Good  Psychomotor Activity:  Normal  Concentration:  Concentration: Good  Recall:  Good  Fund of Knowledge:  Good  Language:  Good  Akathisia:  No  Handed:  Right  AIMS (if indicated):     Assets:  Communication Skills Desire for Improvement Financial Resources/Insurance [place] Talents/Skills Transportation  ADL's:   Intact  Cognition:  WNL  Sleep:   poor        Screenings: Mini-Mental     Clinical Support from 09/16/2018 in [place] Office Visit from 05/27/2012 in [place]  Total Score (max 30 points )  30  30    PHQ2-9     Office Visit from 09/16/2018 in [place] Most recent reading at 09/16/2018 10:51 AM Clinical Support from 09/16/2018 in [place] Most recent reading at 09/16/2018 10:22 AM Office Visit from 06/03/2018 in [place] Most recent reading at 06/03/2018 11:43 AM Office Visit from 02/25/2018 in [place] Most recent reading at 02/25/2018 11:52 AM Office Visit from 11/12/2017 in [place] Most recent reading at 11/12/2017 10:37 AM  PHQ-2 Total Score  0  0  0  0  0      I reviewed the information below on 10/01/2018 and have updated it Assessment and Plan:  MDD-recurrent, mild; GAD  Current status of symptoms: stable   Medication management with supportive therapy. Risks/benefits and SE of the medication discussed. Pt verbalized understanding and verbal consent obtained for treatment.  Affirm with the patient that the medications are taken as ordered. Patient expressed understanding of how their medications were to be used.    Meds: Wellbutrin 100mg  po BID for MD Zoloft 50mg  po qAM for MDD and GAD   Labs: none   Therapy: brief supportive therapy provided. Discussed psychosocial stressors in detail.   Encouraged pt to develop daily routine and work on daily goal setting as a way to improve mood symptoms.    Consultations: none  Pt denies SI and is at an acute low risk for suicide. Patient told to call clinic if any problems occur.  Patient advised to go to ER if they should develop SI/HI, side effects, or if symptoms worsen. Has crisis numbers to call if needed. Pt verbalized understanding.  F/up in 6  months or sooner if needed (per patient request)     Charlcie Cradle, MD 10/01/2018, 1:42 PM

## 2018-10-02 ENCOUNTER — Other Ambulatory Visit: Payer: Self-pay | Admitting: Family Medicine

## 2018-10-02 DIAGNOSIS — R928 Other abnormal and inconclusive findings on diagnostic imaging of breast: Secondary | ICD-10-CM

## 2018-10-02 MED ORDER — INSULIN LISPRO (1 UNIT DIAL) 100 UNIT/ML (KWIKPEN): 35.0000 [IU] | PEN_INJECTOR | Freq: Two times a day (BID) | SUBCUTANEOUS | 3 refills | Status: DC

## 2018-10-05 ENCOUNTER — Other Ambulatory Visit: Payer: Self-pay

## 2018-10-05 MED ORDER — AMMONIUM LACTATE 12 % EX LOTN: TOPICAL_LOTION | CUTANEOUS | 12 refills | Status: DC

## 2018-10-05 MED ORDER — FUROSEMIDE 40 MG PO TABS: ORAL_TABLET | ORAL | 3 refills | Status: DC

## 2018-10-05 NOTE — Telephone Encounter (Signed)
Patient also needs a call back after 2 pm to let her know why Novolog was changed to Humalog. This was due to formulary and [facility] called PCP about doing this.  Call back is [phone]  Danley Danker, RN [facility] RN)

## 2018-10-07 ENCOUNTER — Ambulatory Visit
Admission: RE | Admit: 2018-10-07 | Discharge: 2018-10-07 | Disposition: A | Discharge status: Home / Self Care | Payer: Medicare Other | Source: Ambulatory Visit | Attending: Family Medicine | Admitting: Family Medicine

## 2018-10-07 DIAGNOSIS — N6489 Other specified disorders of breast: Secondary | ICD-10-CM | POA: Diagnosis not present

## 2018-10-07 DIAGNOSIS — R928 Other abnormal and inconclusive findings on diagnostic imaging of breast: Secondary | ICD-10-CM | POA: Diagnosis not present

## 2018-10-08 ENCOUNTER — Ambulatory Visit (INDEPENDENT_AMBULATORY_CARE_PROVIDER_SITE_OTHER): Payer: Medicare Other | Admitting: Internal Medicine

## 2018-10-08 ENCOUNTER — Encounter: Payer: Self-pay | Admitting: Internal Medicine

## 2018-10-08 VITALS — BP 130/68 | HR 84 | Ht 61.0 in | Wt 196.4 lb

## 2018-10-08 DIAGNOSIS — G4733 Obstructive sleep apnea (adult) (pediatric): Secondary | ICD-10-CM | POA: Diagnosis not present

## 2018-10-08 DIAGNOSIS — J4540 Moderate persistent asthma, uncomplicated: Secondary | ICD-10-CM | POA: Diagnosis not present

## 2018-10-08 NOTE — Patient Instructions (Signed)
Order- DME Huey Romans- please teach patient to increase humidifier for comfort, continue auto 5-15, mask of choice, supplies, Air[employer]/ card  Please call as needed

## 2018-10-08 NOTE — Progress Notes (Signed)
Patient ID: [name] [name], female    DOB: 01-06-1951, 68 y.o.   MRN: [id number]   Brief patient profile:   27 yobf never regular smoker quit in the 1977   followed for asthma, sleep apnea, complicated by hx depression, vocal cord paralysis, GERD. NPSG 07/19/10  AHI 7.3/ hr, desaturation to 83%, body weight 186 lbs PFT 07/19/2010-moderate obstructive airways disease, insignificant response to bronchodilator, FEV1/FVC 0.65, TLC 63%, DLCO 71% --------------------------------------------------------------------------------------  03/19/18- 68 year old female former occasional smoker followed for asthma/COPD, OSA, complicated by history depression, vocal cord paralysis, GERD, obesity, DM 2, glaucoma CPAP auto 5-15/[facility] -----Breathing is doing good, Cpap is okay she feels that some days are good and some are bad. Takes CPAP off if she smells "burning". Download 47% compliance AHI 1.0/hour. Symbicort 160 Singulair, Astelin nasal spray, albuterol HFA, Flonase Never replaced her CPAP machine last year.  Says 2 or 3 times a week the air she breathes in from her machine "burns" so she takes it off.  The download mainly reports skips nights and does not indicate short nights.  She does not recognize that humidifier is empty or that anything is burning. CXR 02/19/2018- IMPRESSION: No active cardiopulmonary disease.  10/08/2018- 68 year old female former occasional smoker followed for asthma/COPD, OSA, complicated by history depression, vocal cord paralysis, GERD, obesity, DM 2, glaucoma CPAP auto 5-15/[employer] Download 87% compliance AHI 0.9/hour. -----concerns about humidification on CPAP.  thinks its not working right and feels "dried out" Asthma/COPD addressed by allergist including office spirometry 09/09/2018-reviewed. FVC 1.75/6%, FEV1 1.33/84%, ratio 0.76, FEF 25-75% 1.22/78%.  Cough artifact noted Pro-air HFA,  Symbicort 160, azelastine nasal spray, flonase, singulair Her allergist, Dr. Maudie Mercury, is  helping manage allergy and asthma symptoms.  She feels well controlled.  Sleep quality varies-wears CPAP every night but sometimes tosses and turns.  Pressure is  comfortable but does not know how to adjust humidifier.  ROS-see HPI   + = positive Constitutional:    weight loss, night sweats, fevers, chills, fatigue, lassitude. HEENT:    headaches, difficulty swallowing, tooth/dental problems, sore throat,       sneezing, itching, ear ache, nasal congestion, post nasal drip, snoring CV:    chest pain, orthopnea, PND, swelling in lower extremities, anasarca,                                                           dizziness, palpitations Resp:   shortness of breath with exertion or at rest.                productive cough,   non-productive cough, coughing up of blood.              change in color of mucus.  wheezing.   Skin:    rash or lesions. GI:  No-   heartburn, indigestion, abdominal pain, nausea, vomiting, diarrhea,                 change in bowel habits, loss of appetite GU: dysuria, change in color of urine, no urgency or frequency.   flank pain. MS:   joint pain, stiffness, decreased range of motion, back pain. Neuro-     nothing unusual Psych:  change in mood or affect.  depression or anxiety.   memory loss.  OBJ- Physical Exam General- Alert,  Oriented, Affect-appropriate, Distress- none acute, + obese Skin- rash-none, lesions- none, excoriation- none Lymphadenopathy- none Head- atraumatic            Eyes- + impaired/ thick glasses, PERRLA, conjunctivae and secretions clear            Ears- Hearing, canals-normal            Nose- Clear, no-Septal dev, mucus, polyps, erosion, perforation             Throat- Mallampati II , mucosa clear , drainage- none, tonsils- atrophic Neck- flexible , trachea midline, no stridor , thyroid nl, carotid no bruit Chest - symmetrical excursion , unlabored           Heart/CV- RRR , no murmur , no gallop  , no rub, nl s1 s2                            - JVD- none , edema- none, stasis changes- none, varices- none           Lung- clear to P&A, wheeze- none, cough- none , dullness-none, rub- none           Chest wall-  Abd-  Br/ Gen/ Rectal- Not done, not indicated Extrem- cyanosis- none, clubbing, none, atrophy- none, strength- nl Neuro- grossly intact to observation

## 2018-10-09 ENCOUNTER — Encounter: Payer: Self-pay | Admitting: Cardiology

## 2018-10-09 ENCOUNTER — Ambulatory Visit (INDEPENDENT_AMBULATORY_CARE_PROVIDER_SITE_OTHER): Payer: Medicare Other | Admitting: Cardiology

## 2018-10-09 ENCOUNTER — Encounter (INDEPENDENT_AMBULATORY_CARE_PROVIDER_SITE_OTHER): Payer: Self-pay

## 2018-10-09 VITALS — BP 136/76 | HR 77 | Ht 61.5 in | Wt 196.8 lb

## 2018-10-09 DIAGNOSIS — G4733 Obstructive sleep apnea (adult) (pediatric): Secondary | ICD-10-CM | POA: Diagnosis not present

## 2018-10-09 DIAGNOSIS — Z9989 Dependence on other enabling machines and devices: Secondary | ICD-10-CM

## 2018-10-09 DIAGNOSIS — E1149 Type 2 diabetes mellitus with other diabetic neurological complication: Secondary | ICD-10-CM | POA: Diagnosis not present

## 2018-10-09 DIAGNOSIS — I10 Essential (primary) hypertension: Secondary | ICD-10-CM

## 2018-10-09 DIAGNOSIS — R002 Palpitations: Secondary | ICD-10-CM | POA: Diagnosis not present

## 2018-10-09 DIAGNOSIS — E785 Hyperlipidemia, unspecified: Secondary | ICD-10-CM | POA: Diagnosis not present

## 2018-10-09 NOTE — Progress Notes (Signed)
Cardiology Office Note:    Date:  10/09/2018   ID:  [name] [name], [name] [name] Nov 01, 1950, MRN [id number]  PCP:  Dickie La, MD  Cardiologist:  Dorris Carnes, MD  Referring MD: Dickie La, MD   Chief Complaint  Patient presents with  . Follow-up    HTN, HLD, palpitations    History of Present Illness:    [name] [name] is a 68 y.o. female with a past medical history significant for hypertension, hyperlipidemia, aortic sclerosis, asthma/COPD, obesity, diabetes type 2, sleep apnea on CPAP here today for annual follow-up.  Cardiac cath in 2015 showed normal coronary arteries.  [name] is here today for annual follow up. She continues to feel a brief fluttering in her chest that last up to a minute. It occurs sometimes twice a week and sometimes none in a week. She feels it mostly at night when she lays down. She has not felt it while up during the day. She feels like it is in the inside with her heart and not on the surface like a muscle spasm. It causes her to turn back and forth in bed trying to get it to stop. She says that she wore a heart monitor once many years ago. She would like to find out what the fluttering is.   She does volunteer with her church 2 days per week. She lives alone and does her own housework, cooking, shopping. She's had no exertional chest discomfort or shortness of breath. She does not do any exercise because she gets too worn out. She's had no dizziness or syncope. She has no orthopnea or PND. Occasional mild pedal edema, none at present.   She says that her BP has been running high at home. She uses a Public affairs consultant. Her BP is OK here and at her appt with pulmonology yesterday. I advised her to take her machine in to a provider to compare it to manual BP.   Past Medical History:  Diagnosis Date  . Allergy   . Angina   . Anxiety   . Arthritis   . Asthma   . Blood transfusion   . Blood transfusion without reported diagnosis   . Cataract   .  Chronic cough    Now followed by pulmonary  . Chronic lower back pain   . Complication of anesthesia    "just wake up coughing; that's all"  . Concussion 11/18/11   "fell at dr's office; hit head"  . COPD (chronic obstructive pulmonary disease) ([place])   . Delayed gastric emptying   . Depression   . Diabetes mellitus type II    "take insulin & pills"  . Dyslipidemia   . Exertional dyspnea   . GERD (gastroesophageal reflux disease)   . Glaucoma   . Gout   . Headache(784.0) 11/18/11   "I've had mild headaches the last couple days"  . Headache(784.0) 01/08/12   "pretty constant since 11/18/11's concussion"  . Heart murmur   . History of colonoscopy   . HTN (hypertension)   . Myocardial infarction ([place])   . Osteoporosis   . Peripheral neuropathy   . Pneumonia    "i've had it once" (11/18/11)  . Sleep apnea    on CPAP 12  . Syncope 06/16/2012  . Vocal cord paralysis, unilateral partial    "right"    Past Surgical History:  Procedure Laterality Date  . ANTERIOR CERVICAL DECOMP/DISCECTOMY FUSION  01/15/2012   Procedure: ANTERIOR CERVICAL DECOMPRESSION/DISCECTOMY FUSION 3 LEVELS;  Surgeon: Eustace Moore, MD;  Location: [facility] NEURO ORS;  Service: Neurosurgery;  Laterality: N/A;  Anterior Cervical Decompression Discectomy Fusion Cerivcal three four, cervical four five, cervical five six, cervical six seven  . BREAST BIOPSY     bilaterally  . BREAST CYST EXCISION     right twice; left once  . BREAST REDUCTION SURGERY  04-21-2003  . BRONCHOSCOPY  07-2007  . CARDIAC CATHETERIZATION  03-02-2004  . CARPAL TUNNEL RELEASE     left  . CATARACT EXTRACTION  1955; 1979   bilateral; right eye  . CESAREAN SECTION  1987  . COLONOSCOPY W/ BIOPSIES  11/21/2010   adenoma polyp--no hi grade dysplasia Dr Collene Mares  . EYE SURGERY    . KNEE ARTHROSCOPY  08/2000   right  . LEFT AND RIGHT HEART CATHETERIZATION WITH CORONARY ANGIOGRAM N/A 01/19/2014   Procedure: LEFT AND RIGHT HEART CATHETERIZATION WITH CORONARY  ANGIOGRAM;  Surgeon: Sinclair Grooms, MD;  Location: [facility] CATH LAB;  Service: Cardiovascular;  Laterality: N/A;  . REDUCTION MAMMAPLASTY Bilateral   . SHOULDER ARTHROSCOPY  08/2000   right  . SHOULDER ARTHROSCOPY W/ ROTATOR CUFF REPAIR  10/18/2003   left  . SPINE SURGERY    . T-score  09/02/04   -0.54 (low risk currently)  . TOTAL ABDOMINAL HYSTERECTOMY  10-07-2000  . TREATMENT FISTULA ANAL    . TUBAL LIGATION  1987    Current Medications: Current Meds  Medication Sig  . ACCU-CHEK FASTCLIX LANCETS MISC TEST THREE TIMES DAILY  . acyclovir (ZOVIRAX) 200 MG capsule Take 1 capsule (200 mg total) by mouth 2 (two) times daily.  [name] albuterol (PROAIR HFA) 108 (90 Base) MCG/ACT inhaler Inhale 2 puffs into the lungs every 4 (four) hours as needed for wheezing or shortness of breath.  . Alcohol Swabs (B-D SINGLE USE SWABS REGULAR) PADS USE THREE TIMES DAILY  . allopurinol (ZYLOPRIM) 300 MG tablet TAKE ONE TABLET BY MOUTH DAILY  . ammonium lactate (LAC-HYDRIN) 12 % lotion APPLY 1 APPLICATION TOPICALLY TWICE DAILY AS NEEDED FOR DRY SKIN (SUBSTITUTED FOR LAC HYDRIN)  . aspirin EC 81 MG tablet Take 81 mg by mouth daily.  [name] atorvastatin (LIPITOR) 40 MG tablet TAKE 1 TABLET EVERY DAY  . azelastine (ASTELIN) 0.1 % nasal spray USE 2 SPRAYS IN EACH NOSTRIL EVERY DAY AS NEEDED AS DIRECTED  . B-D ULTRAFINE III SHORT PEN 31G X 8 MM MISC USE TO INJECT THREE TIMES DAILY  . budesonide-formoterol (SYMBICORT) 160-4.5 MCG/ACT inhaler INHALE 2 PUFFS INTO THE LUNGS 2 (TWO) TIMES DAILY. RINSE, GARGLE, AND SPIT AFTER USE.  [name] buPROPion (WELLBUTRIN) 100 MG tablet Take 1 tablet (100 mg total) by mouth 2 (two) times daily.  . COMBIGAN 0.2-0.5 % ophthalmic solution Place 1 drop into the left eye every 12 (twelve) hours.  . diclofenac sodium (VOLTAREN) 1 % GEL Apply 2 g topically 4 (four) times daily.  . Dulaglutide (TRULICITY) 1.5 ZY/6.0YT SOPN INJECT  1.5MG   INTO  THE  SKIN EVERY WEEK  . EPINEPHrine 0.3 mg/0.3 mL IJ SOAJ  injection Inject 0.3 mLs (0.3 mg total) into the muscle once as needed (for allergic reaction).  [name] esomeprazole (NEXIUM) 40 MG capsule TAKE 1 CAPSULE EVERY DAY  . ezetimibe (ZETIA) 10 MG tablet Take 1 tablet (10 mg total) by mouth daily.  . fluticasone (FLONASE) 50 MCG/ACT nasal spray Place 2 sprays into both nostrils daily.  . furosemide (LASIX) 40 MG tablet Take one tablet by mouth daily  . glucose blood (PRODIGY  NO CODING BLOOD GLUC) test strip USE AS INSTRUCTED TO CHECK BLOOD SUGAR THREE TIMES A DAY  . Insulin Glargine (LANTUS SOLOSTAR) 100 UNIT/ML Solostar Pen INJECT 35 UNITS INTO THE SKIN 2 (TWO) TIMES DAILY.  [name] insulin lispro (HUMALOG) 100 UNIT/ML KwikPen Inject 0.35 mLs (35 Units total) into the skin 2 (two) times daily after a meal.  . latanoprost (XALATAN) 0.005 % ophthalmic solution Place 1 drop into both eyes at bedtime.  [name] LINZESS 290 MCG CAPS capsule Take 290 mcg by mouth daily as needed.   [name] losartan (COZAAR) 50 MG tablet Take 2 tablets (100 mg total) by mouth daily.  . metoCLOPramide (REGLAN) 5 MG tablet Take 1 tablet (5 mg total) by mouth 4 (four) times daily.  . metoprolol succinate (TOPROL-XL) 50 MG 24 hr tablet TAKE 3 TABLETS EVERY DAY WITH OR IMMEDIATELY FOLLOWING A MEAL  . montelukast (SINGULAIR) 10 MG tablet Take 1 tablet (10 mg total) by mouth daily.  . Multiple Vitamins-Minerals (MULTIVITAMIN PO) Take 1 tablet by mouth daily.  . nitroGLYCERIN (NITROSTAT) 0.4 MG SL tablet Place 1 tablet (0.4 mg total) under the tongue every 5 (five) minutes as needed for chest pain.  [name] RESTASIS MULTIDOSE 0.05 % ophthalmic emulsion Place 1 drop into both eyes daily as needed.  . sertraline (ZOLOFT) 50 MG tablet Take 1 tablet (50 mg total) by mouth daily.  [name] spironolactone (ALDACTONE) 25 MG tablet TAKE 1 TABLET EVERY DAY BEFORE SUPPER  . traMADol (ULTRAM) 50 MG tablet TAKE 1-2 TABLETS BY MOUTH EVERY 8 HOURS AS NEEDED FOR PAIN. MAX 6 TABLETS PER 24 HOURS     Allergies:   Bee venom;  Propoxyphene hcl; Amlodipine besylate; Hydrocodone; Lisinopril; Metformin and related; Metronidazole; and Valsartan   Social History   Socioeconomic History  . Marital status: Single    Spouse name: Not on file  . Number of children: 1  . Years of education: 55  . Highest education level: 12th grade  Occupational History  . Occupation: unemployed    Fish farm manager: UNEMPLOYED  Social Needs  . Financial resource strain: Not very hard  . Food insecurity:    Worry: Sometimes true    Inability: Sometimes true  . Transportation needs:    Medical: No    Non-medical: No  Tobacco Use  . Smoking status: Former Smoker    Packs/day: 0.50    Years: 0.50    Pack years: 0.25    Types: Cigarettes    Last attempt to quit: 09/03/1975    Years since quitting: 43.1  . Smokeless tobacco: Never Used  . Tobacco comment: no plans to start again  Substance and Sexual Activity  . Alcohol use: No  . Drug use: No  . Sexual activity: Not Currently  Lifestyle  . Physical activity:    Days per week: 0 days    Minutes per session: 0 min  . Stress: Not at all  Relationships  . Social connections:    Talks on phone: More than three times a week    Gets together: More than three times a week    Attends religious service: More than 4 times per year    Active member of club or organization: Yes    Attends meetings of clubs or organizations: More than 4 times per year    Relationship status: Never married  Other Topics Concern  . Not on file  Social History Narrative      Emergency Contact: sister, catherine Mccance [phone]  Who lives with you: Lives alone, in ground level apartment; has smoke alarms. No throw rugs on floor. Has grab bars at tub and toilet.   Any pets: none   Diet: Pt has a varied diet of protein, starch, and vegetables.   Exercise: Pt walks 1x a week with [place] [employer] for visually impaired.   Seatbelts: Pt reports wearing seatbelt when in vehicles.    Hobbies:  walking at [employer], church and church activities      Current Social History 06/11/2017        Who lives at home: Patient lives alone in first floor apt without steps 06/11/2017    Transportation: Patient relies on [employer], bus or others to drive [id number]   Important Relationships Son, two sisters, brother and their families 06/11/2017    Pets: None 06/11/2017   Education / Work:  79 th grade/ Volunteers at [employer] 06/11/2017   Interests / Fun: Walk around [place] 06/11/2017   Current Stressors: "I get stressed when I'm not feeling good." 06/11/2017   Religious / Personal Beliefs: "Holiness" 06/11/2017   L. Ducatte, RN, BSN                                                                                                   Family History: The patient's family history includes Cancer in her sister; Congestive Heart Failure in her brother; Diabetes in her father; Heart disease in her mother; Hyperlipidemia in her brother and sister; Hypertension in her brother, sister, and sister. There is no history of Colon cancer, Depression, or Anxiety disorder. ROS:   Please see the history of present illness.     All other systems reviewed and are negative.  EKGs/Labs/Other Studies Reviewed:    The following studies were reviewed today:  Echocardiogram 09/11/2016 Study Conclusions  - Left ventricle: The cavity size was normal. Wall thickness was   normal. Systolic function was normal. The estimated ejection   fraction was in the range of 60% to 65%. Wall motion was normal;   there were no regional wall motion abnormalities. Features are   consistent with a pseudonormal left ventricular filling pattern,   with concomitant abnormal relaxation and increased filling   pressure (grade 2 diastolic dysfunction). - Aortic valve: Mildly to moderately calcified annulus. Mildly   calcified leaflets. There was mild stenosis. - Pulmonary arteries: Systolic pressure was moderately increased.   PA peak  pressure: 46 mm Hg (S).   EKG:  EKG is ordered today.  The ekg ordered today demonstrates NSR with LAD, 74 bpm, normal voltage criteria for LVH, may be normal variant.  No significant change from prior tracings.  Recent Labs: 06/03/2018: ALT 22; BUN 9; Creatinine, Ser 0.70; Hemoglobin 11.3; Platelets 212; Potassium 4.1; Sodium 141; TSH 1.570   Recent Lipid Panel    Component Value Date/Time   CHOL 121 11/27/2016 1024   TRIG 99 11/27/2016 1024   HDL 28 (L) 11/27/2016 1024   CHOLHDL 4.3 11/27/2016 1024   CHOLHDL 3.7 10/20/2015 1212   VLDL 16 10/20/2015 1212   LDLCALC 73 11/27/2016 1024  LDLDIRECT 104 09/11/2016 1152    Physical Exam:    VS:  BP 136/76   Pulse 77   Ht 5' 1.5" (1.562 m)   Wt 196 lb 12.8 oz (89.3 kg)   SpO2 98%   BMI 36.58 kg/m     Wt Readings from Last 3 Encounters:  10/09/18 196 lb 12.8 oz (89.3 kg)  10/08/18 196 lb 6.4 oz (89.1 kg)  09/16/18 198 lb 9.6 oz (90.1 kg)     Physical Exam  Constitutional: She is oriented to person, place, and time. She appears well-developed and well-nourished. No distress.  HENT:  Head: Normocephalic and atraumatic.  Neck: Normal range of motion. Neck supple. No JVD present.  Cardiovascular: Normal rate, regular rhythm, normal heart sounds and intact distal pulses. Exam reveals no gallop and no friction rub.  No murmur heard. Pulmonary/Chest: Effort normal and breath sounds normal.  Abdominal: Soft. Bowel sounds are normal.  Musculoskeletal: Normal range of motion.        General: No edema.  Neurological: She is alert and oriented to person, place, and time.  Skin: Skin is warm and dry.  Psychiatric: She has a normal mood and affect. Her behavior is normal. Judgment and thought content normal.  Vitals reviewed.   ASSESSMENT:    1. Palpitations   2. HYPERTENSION, BENIGN SYSTEMIC   3. Hyperlipidemia, unspecified hyperlipidemia type   4. OSA on CPAP   5. Diabetes mellitus type 2 with neurological manifestations  ([place])    PLAN:    In order of problems listed above:  Palpitations -Pt has had palpitations for about the last year, noticed at night when she lays down. Occurs up to twice a week. No associated symptoms.  She finds herself turning back-and-forth in bed trying to get it to stop. -She had normal TSH in 06/2018. -EKG without evidence of arrhythmia. -We will order a 14-day heart monitor to assess for possible arrhythmia.  If monitor does not show arrhythmia and patient had symptoms during that time, this would reassure her that her palpitations are not significant.  Hypertension -Blood pressure is well controlled today.  Continue current medications.  Hyperlipemia -On atorvastatin and zetia. Followed by PCP.   OSA -On CPAP followed by pulmonology Dr. Annamaria Boots.  Patient reports compliance.  Diabetes type 2 on insulin -A1c 6.6 on 09/16/2018 and getting adequate control.  Medication Adjustments/Labs and Tests Ordered: Current medicines are reviewed at length with the patient today.  Concerns regarding medicines are outlined above. Labs and tests ordered and medication changes are outlined in the patient instructions below:  Patient Instructions  Medication Instructions:  Your physician recommends that you continue on your current medications as directed. Please refer to the Current Medication list given to you today.  If you need a refill on your cardiac medications before your next appointment, please call your pharmacy.   Lab work: None  If you have labs (blood work) drawn today and your tests are completely normal, you will receive your results only by: [name] MyChart Message (if you have MyChart) OR . A paper copy in the mail If you have any lab test that is abnormal or we need to change your treatment, we will call you to review the results.  Testing/Procedures:  Your physician has recommended that you wear a long term monitor for 14 days    Follow-Up: At [facility], you and  your health needs are our priority.  As part of our continuing mission to provide you with exceptional  heart care, we have created designated Provider Care Teams.  These Care Teams include your primary Cardiologist (physician) and Advanced Practice Providers (APPs -  Physician Assistants and Nurse Practitioners) who all work together to provide you with the care you need, when you need it. You will need a follow up appointment in:  1 years.  Please call our office 2 months in advance to schedule this appointment.  You may see Dr. Harrington Challenger or one of the following Advanced Practice Providers on your designated Care Team: Richardson Dopp, PA-C [place], [place] . Daune Perch, NP  Any Other Special Instructions Will Be Listed Below (If Applicable).      Signed, Daune Perch, NP  10/09/2018 5:52 PM    [facility]

## 2018-10-09 NOTE — Patient Instructions (Signed)
Medication Instructions:  Your physician recommends that you continue on your current medications as directed. Please refer to the Current Medication list given to you today.  If you need a refill on your cardiac medications before your next appointment, please call your pharmacy.   Lab work: None  If you have labs (blood work) drawn today and your tests are completely normal, you will receive your results only by: [name] MyChart Message (if you have MyChart) OR . A paper copy in the mail If you have any lab test that is abnormal or we need to change your treatment, we will call you to review the results.  Testing/Procedures:  Your physician has recommended that you wear a long term monitor for 14 days    Follow-Up: At [facility], you and your health needs are our priority.  As part of our continuing mission to provide you with exceptional heart care, we have created designated Provider Care Teams.  These Care Teams include your primary Cardiologist (physician) and Advanced Practice Providers (APPs -  Physician Assistants and Nurse Practitioners) who all work together to provide you with the care you need, when you need it. You will need a follow up appointment in:  1 years.  Please call our office 2 months in advance to schedule this appointment.  You may see Dr. Harrington Challenger or one of the following Advanced Practice Providers on your designated Care Team: Richardson Dopp, PA-C [place], [place] . Daune Perch, NP  Any Other Special Instructions Will Be Listed Below (If Applicable).

## 2018-10-09 NOTE — Assessment & Plan Note (Signed)
She continues to benefit from CPAP with improved sleep.  Download confirms good compliance and control. [employer] to teach patient how to adjust her humidifier for comfort.  Continue CPAP 5-15

## 2018-10-09 NOTE — Assessment & Plan Note (Signed)
She is now working with her allergist and seems to be doing well with no recent exacerbation.

## 2018-10-21 ENCOUNTER — Telehealth: Payer: Self-pay | Admitting: Cardiology

## 2018-10-21 NOTE — Telephone Encounter (Signed)
New Message   Patient calling about Holter monitor test wants to know if she can where lotion when she comes in for appointment 10/22/18.  She also has a BP monitor she wants to know if she can have that checked at appointment as well.

## 2018-10-21 NOTE — Telephone Encounter (Signed)
No lotion on upper chest please.   We will not be able to check you BP device when you come in for a heart monitor.  Please bring it in with you next time you have an appointment with your cardiologist.

## 2018-10-22 ENCOUNTER — Ambulatory Visit (INDEPENDENT_AMBULATORY_CARE_PROVIDER_SITE_OTHER): Payer: Medicare Other

## 2018-10-22 DIAGNOSIS — R002 Palpitations: Secondary | ICD-10-CM

## 2018-10-29 ENCOUNTER — Encounter: Payer: Self-pay | Admitting: Podiatry

## 2018-10-29 ENCOUNTER — Ambulatory Visit (INDEPENDENT_AMBULATORY_CARE_PROVIDER_SITE_OTHER): Payer: Medicare Other | Admitting: Podiatry

## 2018-10-29 DIAGNOSIS — M79675 Pain in left toe(s): Secondary | ICD-10-CM | POA: Diagnosis not present

## 2018-10-29 DIAGNOSIS — B351 Tinea unguium: Secondary | ICD-10-CM | POA: Diagnosis not present

## 2018-10-29 DIAGNOSIS — E0842 Diabetes mellitus due to underlying condition with diabetic polyneuropathy: Secondary | ICD-10-CM | POA: Diagnosis not present

## 2018-10-29 DIAGNOSIS — M79674 Pain in right toe(s): Secondary | ICD-10-CM | POA: Diagnosis not present

## 2018-10-29 DIAGNOSIS — L84 Corns and callosities: Secondary | ICD-10-CM | POA: Diagnosis not present

## 2018-11-07 NOTE — Progress Notes (Addendum)
Subjective: [name] [name] presents with diabetes, diabetic neuropathy and cc of painful, discolored, thick toenails and painful calluses which interfere with activities of daily living. Pain is aggravated when wearing enclosed shoe gear. Pain is relieved with periodic professional debridement.  She is requesting diabetic shoes on today's visit.  Dickie La, MD is her PCP. Last visit was 09/16/2018.   Current Outpatient Medications:  .  ACCU-CHEK FASTCLIX LANCETS MISC, TEST THREE TIMES DAILY, Disp: 306 each, Rfl: 12 .  acyclovir (ZOVIRAX) 200 MG capsule, Take 1 capsule (200 mg total) by mouth 2 (two) times daily., Disp: 180 capsule, Rfl: 3 .  albuterol (PROAIR HFA) 108 (90 Base) MCG/ACT inhaler, Inhale 2 puffs into the lungs every 4 (four) hours as needed for wheezing or shortness of breath., Disp: 3 Inhaler, Rfl: 3 .  Alcohol Swabs (B-D SINGLE USE SWABS REGULAR) PADS, USE THREE TIMES DAILY, Disp: 300 each, Rfl: 3 .  allopurinol (ZYLOPRIM) 300 MG tablet, TAKE ONE TABLET BY MOUTH DAILY, Disp: 90 tablet, Rfl: 3 .  ammonium lactate (LAC-HYDRIN) 12 % lotion, APPLY 1 APPLICATION TOPICALLY TWICE DAILY AS NEEDED FOR DRY SKIN (SUBSTITUTED FOR LAC HYDRIN), Disp: 400 g, Rfl: 12 .  aspirin EC 81 MG tablet, Take 81 mg by mouth daily., Disp: , Rfl:  .  atorvastatin (LIPITOR) 40 MG tablet, TAKE 1 TABLET EVERY DAY, Disp: 90 tablet, Rfl: 3 .  azelastine (ASTELIN) 0.1 % nasal spray, USE 2 SPRAYS IN EACH NOSTRIL EVERY DAY AS NEEDED AS DIRECTED, Disp: 60 mL, Rfl: 3 .  B-D ULTRAFINE III SHORT PEN 31G X 8 MM MISC, USE TO INJECT THREE TIMES DAILY, Disp: 270 each, Rfl: 3 .  budesonide-formoterol (SYMBICORT) 160-4.5 MCG/ACT inhaler, INHALE 2 PUFFS INTO THE LUNGS 2 (TWO) TIMES DAILY. RINSE, GARGLE, AND SPIT AFTER USE., Disp: 3 Inhaler, Rfl: 3 .  buPROPion (WELLBUTRIN) 100 MG tablet, Take 1 tablet (100 mg total) by mouth 2 (two) times daily., Disp: 180 tablet, Rfl: 1 .  COMBIGAN 0.2-0.5 % ophthalmic solution,  Place 1 drop into the left eye every 12 (twelve) hours., Disp: 1 Bottle, Rfl: 3 .  diclofenac sodium (VOLTAREN) 1 % GEL, Apply 2 g topically 4 (four) times daily., Disp: 100 g, Rfl: 0 .  Dulaglutide (TRULICITY) 1.5 WG/9.5AO SOPN, INJECT  1.5MG   INTO  THE  SKIN EVERY WEEK, Disp: 12 mL, Rfl: 3 .  EPINEPHrine 0.3 mg/0.3 mL IJ SOAJ injection, Inject 0.3 mLs (0.3 mg total) into the muscle once as needed (for allergic reaction)., Disp: 1 Device, Rfl: 3 .  esomeprazole (NEXIUM) 40 MG capsule, TAKE 1 CAPSULE EVERY DAY, Disp: 90 capsule, Rfl: 3 .  ezetimibe (ZETIA) 10 MG tablet, Take 1 tablet (10 mg total) by mouth daily., Disp: 90 tablet, Rfl: 2 .  fluticasone (FLONASE) 50 MCG/ACT nasal spray, Place 2 sprays into both nostrils daily., Disp: 48 g, Rfl: 3 .  furosemide (LASIX) 40 MG tablet, Take one tablet by mouth daily, Disp: 90 tablet, Rfl: 3 .  glucose blood (PRODIGY NO CODING BLOOD GLUC) test strip, USE AS INSTRUCTED TO CHECK BLOOD SUGAR THREE TIMES A DAY, Disp: 300 each, Rfl: 12 .  Insulin Glargine (LANTUS SOLOSTAR) 100 UNIT/ML Solostar Pen, INJECT 35 UNITS INTO THE SKIN 2 (TWO) TIMES DAILY., Disp: 60 mL, Rfl: 3 .  insulin lispro (HUMALOG) 100 UNIT/ML KwikPen, Inject 0.35 mLs (35 Units total) into the skin 2 (two) times daily after a meal., Disp: 75 mL, Rfl: 3 .  latanoprost (XALATAN) 0.005 % ophthalmic  solution, Place 1 drop into both eyes at bedtime., Disp: 2.5 mL, Rfl: 3 .  LINZESS 290 MCG CAPS capsule, Take 290 mcg by mouth daily as needed. , Disp: , Rfl:  .  losartan (COZAAR) 50 MG tablet, Take 2 tablets (100 mg total) by mouth daily., Disp: 90 tablet, Rfl: 3 .  metoCLOPramide (REGLAN) 5 MG tablet, Take 1 tablet (5 mg total) by mouth 4 (four) times daily., Disp: 360 tablet, Rfl: 3 .  metoprolol succinate (TOPROL-XL) 50 MG 24 hr tablet, TAKE 3 TABLETS EVERY DAY WITH OR IMMEDIATELY FOLLOWING A MEAL, Disp: 270 tablet, Rfl: 3 .  montelukast (SINGULAIR) 10 MG tablet, Take 1 tablet (10 mg total) by mouth  daily., Disp: 90 tablet, Rfl: 3 .  Multiple Vitamins-Minerals (MULTIVITAMIN PO), Take 1 tablet by mouth daily., Disp: , Rfl:  .  nitroGLYCERIN (NITROSTAT) 0.4 MG SL tablet, Place 1 tablet (0.4 mg total) under the tongue every 5 (five) minutes as needed for chest pain., Disp: 50 tablet, Rfl: 3 .  RESTASIS MULTIDOSE 0.05 % ophthalmic emulsion, Place 1 drop into both eyes daily as needed., Disp: , Rfl: 3 .  sertraline (ZOLOFT) 50 MG tablet, Take 1 tablet (50 mg total) by mouth daily., Disp: 90 tablet, Rfl: 1 .  spironolactone (ALDACTONE) 25 MG tablet, TAKE 1 TABLET EVERY DAY BEFORE SUPPER, Disp: 90 tablet, Rfl: 3 .  traMADol (ULTRAM) 50 MG tablet, TAKE 1-2 TABLETS BY MOUTH EVERY 8 HOURS AS NEEDED FOR PAIN. MAX 6 TABLETS PER 24 HOURS, Disp: 180 tablet, Rfl: 5  Allergies  Allergen Reactions  . Bee Venom Hives and Swelling  . Propoxyphene Hcl Itching  . Amlodipine Besylate Swelling  . Hydrocodone Other (See Comments)    With Vicodin - makes patient "jittery", and "hangover effect - sleepy next day"  . Lisinopril Cough    Changed to ARB  . Metformin And Related Diarrhea    GI distress  . Metronidazole Other (See Comments)    REACTION: "just didn't work; my body never did heal from it"  . Valsartan Other (See Comments)    REACTION:  "sleep more the next day after I took it; it made me real tired"    Vascular Examination: Capillary refill time immediate  x 10 digits.  Dorsalis pedis and Posterior tibial pulses present b/l.  No digital hair x 10 digits.  Skin temperature WNL b/l.  Dermatological Examination: Skin with normal turgor, texture and tone b/l.  Toenails 1-5 b/l discolored, thick, dystrophic with subungual debris and pain with palpation to nailbeds due to thickness of nails.  Hyperkeratotic lesion submetatarsal head 3, 4 left foot, submetatarsal head 1 right foot. No erythema, no edema, no drainage nor flocculence.  Musculoskeletal: Muscle strength 5/5 to all LE muscle  groups  Hammertoes b/l  Pes Planus foot deformity b/l.  Neurological: Sensation intact with 10 gram monofilament.  Vibratory sensation intact.  Assessment: 1. Painful onychomycosis toenails 1-5 b/l 2. Calluses submetatarsal head 3, 4 left foot, submetatarsal head 1 right foot 3. NIDDM with Diabetic neuropathy  Plan: 1. Continue diabetic foot care principles.  2. Toenails 1-5 b/l were debrided in length and girth without iatrogenic bleeding. 3. Hyperkeratotic lesion pared submetatarsal head 3, 4 left foot, submetatarsal head 1 right foot  with sterile scalpel blade without incident. 4. Patient to continue soft, supportive shoe gear. Start diabetic shoe process with dx of NIDDM, calluses submetatarsal head 3, 4 left foot, submetatarsal head 1 right foot, pes planus b/l, hammertoes lesser digits. 5.  Patient to report any pedal injuries to medical professional. 6. Follow up 3 months.  7. Patient/POA to call should there be a concern in the interim.

## 2018-11-10 DIAGNOSIS — R002 Palpitations: Secondary | ICD-10-CM | POA: Diagnosis not present

## 2018-11-13 ENCOUNTER — Emergency Department (HOSPITAL_COMMUNITY): Payer: Medicare Other

## 2018-11-13 ENCOUNTER — Telehealth: Payer: Self-pay

## 2018-11-13 ENCOUNTER — Other Ambulatory Visit: Payer: Self-pay

## 2018-11-13 ENCOUNTER — Encounter (HOSPITAL_COMMUNITY): Payer: Self-pay | Admitting: Emergency Medicine

## 2018-11-13 ENCOUNTER — Emergency Department (HOSPITAL_COMMUNITY)
Admission: EM | Admit: 2018-11-13 | Discharge: 2018-11-13 | Disposition: A | Discharge status: Home / Self Care | Payer: Medicare Other | Attending: Emergency Medicine | Admitting: Emergency Medicine

## 2018-11-13 DIAGNOSIS — W010XXA Fall on same level from slipping, tripping and stumbling without subsequent striking against object, initial encounter: Secondary | ICD-10-CM | POA: Insufficient documentation

## 2018-11-13 DIAGNOSIS — J449 Chronic obstructive pulmonary disease, unspecified: Secondary | ICD-10-CM | POA: Diagnosis not present

## 2018-11-13 DIAGNOSIS — I252 Old myocardial infarction: Secondary | ICD-10-CM | POA: Diagnosis not present

## 2018-11-13 DIAGNOSIS — Y9289 Other specified places as the place of occurrence of the external cause: Secondary | ICD-10-CM | POA: Insufficient documentation

## 2018-11-13 DIAGNOSIS — I10 Essential (primary) hypertension: Secondary | ICD-10-CM | POA: Insufficient documentation

## 2018-11-13 DIAGNOSIS — S93602A Unspecified sprain of left foot, initial encounter: Secondary | ICD-10-CM | POA: Diagnosis not present

## 2018-11-13 DIAGNOSIS — E119 Type 2 diabetes mellitus without complications: Secondary | ICD-10-CM | POA: Diagnosis not present

## 2018-11-13 DIAGNOSIS — Y990 Civilian activity done for income or pay: Secondary | ICD-10-CM | POA: Diagnosis not present

## 2018-11-13 DIAGNOSIS — S93402A Sprain of unspecified ligament of left ankle, initial encounter: Secondary | ICD-10-CM | POA: Diagnosis not present

## 2018-11-13 DIAGNOSIS — Y939 Activity, unspecified: Secondary | ICD-10-CM | POA: Diagnosis not present

## 2018-11-13 DIAGNOSIS — Z87891 Personal history of nicotine dependence: Secondary | ICD-10-CM | POA: Insufficient documentation

## 2018-11-13 DIAGNOSIS — W19XXXA Unspecified fall, initial encounter: Secondary | ICD-10-CM | POA: Diagnosis not present

## 2018-11-13 DIAGNOSIS — S99912A Unspecified injury of left ankle, initial encounter: Secondary | ICD-10-CM | POA: Diagnosis not present

## 2018-11-13 DIAGNOSIS — S99922A Unspecified injury of left foot, initial encounter: Secondary | ICD-10-CM | POA: Diagnosis not present

## 2018-11-13 DIAGNOSIS — Z794 Long term (current) use of insulin: Secondary | ICD-10-CM | POA: Insufficient documentation

## 2018-11-13 DIAGNOSIS — Z79899 Other long term (current) drug therapy: Secondary | ICD-10-CM | POA: Diagnosis not present

## 2018-11-13 DIAGNOSIS — E785 Hyperlipidemia, unspecified: Secondary | ICD-10-CM | POA: Insufficient documentation

## 2018-11-13 DIAGNOSIS — Z7982 Long term (current) use of aspirin: Secondary | ICD-10-CM | POA: Insufficient documentation

## 2018-11-13 NOTE — Telephone Encounter (Signed)
LMTCB

## 2018-11-13 NOTE — Telephone Encounter (Signed)
-----   Message from Fay Records, MD sent at 11/12/2018 12:20 PM EDT ----- Short burst of PAT   No atrial fibrillation or other arrhythmia noted She is on metoprolol XL    Takes 3 tabs per day I would recomm  Switching to taking at dinner   See if peak effect of drug is more in eviening   She feels spells when laying down.

## 2018-11-13 NOTE — ED Triage Notes (Addendum)
Pt fell this am on her  Rt side, hurt her left ankle , hurts to bear wt, walks with cane anyway ;did not hit her head , NO LOC, has pain in rt hip but always has it takes a baby asa but has not taken ANY of her meds today

## 2018-11-13 NOTE — ED Provider Notes (Signed)
[facility] EMERGENCY DEPARTMENT Provider Note   CSN: [id number] Arrival date & time:       History   Chief Complaint No chief complaint on file.   HPI [name] [name] is a 68 y.o. female.     With past medical history of asthma, anxiety, COPD, diabetes.  She presents today for evaluation of a fall.  She was at an office facility today for a meeting when she lost her balance and fell, injuring her left ankle and foot.  She denies other injury in the fall.  She denies any numbness or tingling.  Her pain is worse with ambulation and relieved with rest.  The history is provided by the patient.    Past Medical History:  Diagnosis Date  . Allergy   . Angina   . Anxiety   . Arthritis   . Asthma   . Blood transfusion   . Blood transfusion without reported diagnosis   . Cataract   . Chronic cough    Now followed by pulmonary  . Chronic lower back pain   . Complication of anesthesia    "just wake up coughing; that's all"  . Concussion 11/18/11   "fell at dr's office; hit head"  . COPD (chronic obstructive pulmonary disease) ([place])   . Delayed gastric emptying   . Depression   . Diabetes mellitus type II    "take insulin & pills"  . Dyslipidemia   . Exertional dyspnea   . GERD (gastroesophageal reflux disease)   . Glaucoma   . Gout   . Headache(784.0) 11/18/11   "I've had mild headaches the last couple days"  . Headache(784.0) 01/08/12   "pretty constant since 11/18/11's concussion"  . Heart murmur   . History of colonoscopy   . HTN (hypertension)   . Myocardial infarction ([place])   . Osteoporosis   . Peripheral neuropathy   . Pneumonia    "i've had it once" (11/18/11)  . Sleep apnea    on CPAP 12  . Syncope 06/16/2012  . Vocal cord paralysis, unilateral partial    "right"    Patient Active Problem List   Diagnosis Date Noted  . Palpitations 10/09/2018  . Pain due to onychomycosis of toenails of both feet 09/18/2018  . Seasonal and  perennial allergic rhinitis 09/09/2018  . LPRD (laryngopharyngeal reflux disease) 09/09/2018  . Delayed gastric emptying 01/23/2017  . Other dysphagia 01/21/2017  . History of wrist fracture, left 03/06/2016  . Asthma, well controlled 06/25/2015  . Major depression, recurrent ([place]) 03/23/2015  . Generalized anxiety disorder 03/23/2015  . Vitamin D deficiency 06/30/2014  . Bee sting allergy 06/30/2014  . Chronic headaches 12/27/2013  . COPD mixed type ([place]) 04/28/2013  . Callous ulcer LEFT foot MT area  ([place]) 01/15/2013  . Chronic flank pain 10/07/2012  . Fusion of spine of cervical region 06/03/2012  . Acquired cyst of kidney 05/28/2012  . Incomplete bladder emptying 05/28/2012  . Legally BLIND, bilateral 02/05/2012  . Nail dystrophy 11/15/2011  . Hip arthritis, bilateral hip pain 05/29/2011  . DJD (degenerative joint disease) of knee 05/01/2011  . Allergic rhinitis due to pollen 01/24/2011  . GAIT IMBALANCE 05/30/2010  . CARPAL TUNNEL SYNDROME, BILATERAL 12/28/2009  . Hyperlipidemia 05/01/2009  . HERPES SIMPLEX INFECTION, RECURRENT 09/14/2008  . NEURAL HEARING LOSS BILATERAL 03/02/2008  . Cataract extraction status 09/10/2007  . Unilateral complete paralysis of vocal cord 02/17/2007  . GERD 12/12/2006  . Diabetes mellitus type 2 with neurological manifestations (  [place]) 10/30/2006  . Major depressive disorder, recurrent episode ([place]) 10/30/2006  . Obstructive sleep apnea 10/30/2006  . Glaucoma 10/30/2006  . HYPERTENSION, BENIGN SYSTEMIC 10/30/2006    Past Surgical History:  Procedure Laterality Date  . ANTERIOR CERVICAL DECOMP/DISCECTOMY FUSION  01/15/2012   Procedure: ANTERIOR CERVICAL DECOMPRESSION/DISCECTOMY FUSION 3 LEVELS;  Surgeon: Eustace Moore, MD;  Location: [facility];  Service: Neurosurgery;  Laterality: N/A;  Anterior Cervical Decompression Discectomy Fusion Cerivcal three four, cervical four five, cervical five six, cervical six seven  . BREAST BIOPSY      bilaterally  . BREAST CYST EXCISION     right twice; left once  . BREAST REDUCTION SURGERY  04-21-2003  . BRONCHOSCOPY  07-2007  . CARDIAC CATHETERIZATION  03-02-2004  . CARPAL TUNNEL RELEASE     left  . CATARACT EXTRACTION  1955; 1979   bilateral; right eye  . CESAREAN SECTION  1987  . COLONOSCOPY W/ BIOPSIES  11/21/2010   adenoma polyp--no hi grade dysplasia Dr Collene Mares  . EYE SURGERY    . KNEE ARTHROSCOPY  08/2000   right  . LEFT AND RIGHT HEART CATHETERIZATION WITH CORONARY ANGIOGRAM N/A 01/19/2014   Procedure: LEFT AND RIGHT HEART CATHETERIZATION WITH CORONARY ANGIOGRAM;  Surgeon: Sinclair Grooms, MD;  Location: [facility];  Service: Cardiovascular;  Laterality: N/A;  . REDUCTION MAMMAPLASTY Bilateral   . SHOULDER ARTHROSCOPY  08/2000   right  . SHOULDER ARTHROSCOPY W/ ROTATOR CUFF REPAIR  10/18/2003   left  . SPINE SURGERY    . T-score  09/02/04   -0.54 (low risk currently)  . TOTAL ABDOMINAL HYSTERECTOMY  10-07-2000  . TREATMENT FISTULA ANAL    . TUBAL LIGATION  1987     OB History   No obstetric history on file.      Home Medications    Prior to Admission medications   Medication Sig Start Date End Date Taking? Authorizing Provider  ACCU-CHEK FASTCLIX LANCETS MISC TEST THREE TIMES DAILY 09/16/18   Dickie La, MD  acyclovir (ZOVIRAX) 200 MG capsule Take 1 capsule (200 mg total) by mouth 2 (two) times daily. 09/16/18   Dickie La, MD  albuterol (PROAIR HFA) 108 (90 Base) MCG/ACT inhaler Inhale 2 puffs into the lungs every 4 (four) hours as needed for wheezing or shortness of breath. 09/11/18   Garnet Sierras, DO  Alcohol Swabs (B-D SINGLE USE SWABS REGULAR) PADS USE THREE TIMES DAILY 09/16/18   Dickie La, MD  allopurinol (ZYLOPRIM) 300 MG tablet TAKE ONE TABLET BY MOUTH DAILY 09/16/18   Dickie La, MD  ammonium lactate (LAC-HYDRIN) 12 % lotion APPLY 1 APPLICATION TOPICALLY TWICE DAILY AS NEEDED FOR DRY SKIN (SUBSTITUTED FOR LAC HYDRIN) 10/05/18   Zenia Resides, MD   aspirin EC 81 MG tablet Take 81 mg by mouth daily.    [provider]  atorvastatin (LIPITOR) 40 MG tablet TAKE 1 TABLET EVERY DAY 09/16/18   Dickie La, MD  azelastine (ASTELIN) 0.1 % nasal spray USE 2 SPRAYS IN EACH NOSTRIL EVERY DAY AS NEEDED AS DIRECTED 09/11/17   Dickie La, MD  B-D ULTRAFINE III SHORT PEN 31G X 8 MM MISC USE TO INJECT THREE TIMES DAILY 12/18/17   Dickie La, MD  budesonide-formoterol (SYMBICORT) 160-4.5 MCG/ACT inhaler INHALE 2 PUFFS INTO THE LUNGS 2 (TWO) TIMES DAILY. RINSE, GARGLE, AND SPIT AFTER USE. 12/01/17   Dickie La, MD  buPROPion (WELLBUTRIN) 100 MG tablet Take 1  tablet (100 mg total) by mouth 2 (two) times daily. 10/01/18   Charlcie Cradle, MD  COMBIGAN 0.2-0.5 % ophthalmic solution Place 1 drop into the left eye every 12 (twelve) hours. 09/29/18   Dickie La, MD  diclofenac sodium (VOLTAREN) 1 % GEL Apply 2 g topically 4 (four) times daily. 02/19/18   Caccavale, Sophia, PA-C  Dulaglutide (TRULICITY) 1.5 VQ/0.0QQ SOPN INJECT  1.5MG   INTO  THE  SKIN EVERY WEEK 09/16/18   Dickie La, MD  EPINEPHrine 0.3 mg/0.3 mL IJ SOAJ injection Inject 0.3 mLs (0.3 mg total) into the muscle once as needed (for allergic reaction). 09/09/18   Garnet Sierras, DO  esomeprazole (NEXIUM) 40 MG capsule TAKE 1 CAPSULE EVERY DAY 10/02/17   Dickie La, MD  ezetimibe (ZETIA) 10 MG tablet Take 1 tablet (10 mg total) by mouth daily. 12/04/17   Fay Records, MD  fluticasone [facility]) 50 MCG/ACT nasal spray Place 2 sprays into both nostrils daily. 09/09/18   Garnet Sierras, DO  furosemide (LASIX) 40 MG tablet Take one tablet by mouth daily 10/05/18   Zenia Resides, MD  glucose blood (PRODIGY NO CODING BLOOD GLUC) test strip USE AS INSTRUCTED TO CHECK BLOOD SUGAR THREE TIMES A DAY 09/16/18   Dickie La, MD  Insulin Glargine (LANTUS SOLOSTAR) 100 UNIT/ML Solostar Pen INJECT 35 UNITS INTO THE SKIN 2 (TWO) TIMES DAILY. 09/29/18   Dickie La, MD  insulin lispro (HUMALOG) 100 UNIT/ML KwikPen  Inject 0.35 mLs (35 Units total) into the skin 2 (two) times daily after a meal. 10/02/18   Dickie La, MD  latanoprost (XALATAN) 0.005 % ophthalmic solution Place 1 drop into both eyes at bedtime. 09/29/18   Dickie La, MD  LINZESS 290 MCG CAPS capsule Take 290 mcg by mouth daily as needed.  08/13/16   [provider]  losartan (COZAAR) 50 MG tablet Take 2 tablets (100 mg total) by mouth daily. 07/31/17   Zenia Resides, MD  metoCLOPramide (REGLAN) 5 MG tablet Take 1 tablet (5 mg total) by mouth 4 (four) times daily. 09/16/18   Dickie La, MD  metoprolol succinate (TOPROL-XL) 50 MG 24 hr tablet TAKE 3 TABLETS EVERY DAY WITH OR IMMEDIATELY FOLLOWING A MEAL 09/16/18   Dickie La, MD  montelukast (SINGULAIR) 10 MG tablet Take 1 tablet (10 mg total) by mouth daily. 09/09/18   Garnet Sierras, DO  Multiple Vitamins-Minerals (MULTIVITAMIN PO) Take 1 tablet by mouth daily.    [provider]  nitroGLYCERIN (NITROSTAT) 0.4 MG SL tablet Place 1 tablet (0.4 mg total) under the tongue every 5 (five) minutes as needed for chest pain. 06/19/17   Zenia Resides, MD  RESTASIS MULTIDOSE 0.05 % ophthalmic emulsion Place 1 drop into both eyes daily as needed. 05/23/17   [provider]  sertraline (ZOLOFT) 50 MG tablet Take 1 tablet (50 mg total) by mouth daily. 10/01/18   Charlcie Cradle, MD  spironolactone (ALDACTONE) 25 MG tablet TAKE 1 TABLET EVERY DAY BEFORE SUPPER 09/16/18   Dickie La, MD  traMADol (ULTRAM) 50 MG tablet TAKE 1-2 TABLETS BY MOUTH EVERY 8 HOURS AS NEEDED FOR PAIN. MAX 6 TABLETS PER 24 HOURS 05/12/17   Dickie La, MD    Family History Family History  Problem Relation Age of Onset  . Diabetes Father   . Heart disease Mother   . Hypertension Sister   . Hyperlipidemia Sister   . Cancer Sister   .  Hypertension Brother   . Hyperlipidemia Brother   . Congestive Heart Failure Brother   . Hypertension Sister   . Colon cancer Neg Hx   . Depression Neg Hx   .  Anxiety disorder Neg Hx     Social History Social History   Tobacco Use  . Smoking status: Former Smoker    Packs/day: 0.50    Years: 0.50    Pack years: 0.25    Types: Cigarettes    Last attempt to quit: 09/03/1975    Years since quitting: 43.2  . Smokeless tobacco: Never Used  . Tobacco comment: no plans to start again  Substance Use Topics  . Alcohol use: No  . Drug use: No     Allergies   Bee venom; Propoxyphene hcl; Amlodipine besylate; Hydrocodone; Lisinopril; Metformin and related; Metronidazole; and Valsartan   Review of Systems Review of Systems  All other systems reviewed and are negative.    Physical Exam Updated Vital Signs BP (!) 173/80 (BP Location: Right Arm)   Pulse 71   Temp 98.3 F (36.8 C) (Oral)   Resp 20   SpO2 97%   Physical Exam Vitals signs and nursing note reviewed.  Constitutional:      Appearance: Normal appearance.  HENT:     Head: Normocephalic and atraumatic.  Neck:     Musculoskeletal: Normal range of motion.  Pulmonary:     Effort: Pulmonary effort is normal.  Musculoskeletal:     Comments: The left foot and ankle appears grossly normal with minimal swelling.  There is tenderness over the lateral malleolus and dorsum of the lateral aspect of the foot.  Pulses, motor, and sensation are intact to the entire foot.  Skin:    General: Skin is warm and dry.  Neurological:     Mental Status: She is alert.      ED Treatments / Results  Labs (all labs ordered are listed, but only abnormal results are displayed) Labs Reviewed - No data to display  EKG None  Radiology No results found.  Procedures Procedures (including critical care time)  Medications Ordered in ED Medications - No data to display   Initial Impression / Assessment and Plan / ED Course  I have reviewed the triage vital signs and the nursing notes.  Pertinent labs & imaging results that were available during my care of the patient were reviewed by me  and considered in my medical decision making (see chart for details).  X-rays are negative for fracture.  This will be treated as a sprain with rest, ice, compression, elevation, and follow-up as needed if not improving.  Final Clinical Impressions(s) / ED Diagnoses   Final diagnoses:  None    ED Discharge Orders    None       Veryl Speak, MD 11/13/18 1252

## 2018-11-13 NOTE — ED Notes (Signed)
Patient transported to X-ray 

## 2018-11-13 NOTE — Discharge Instructions (Addendum)
Elevate your leg is much as possible for the next several days.  Ice for 20 minutes every 2 hours while awake for the next 2 days.  Wear Ace bandage for comfort and support.  Follow-up with your primary doctor if symptoms are not improving in the next week to 10 days.

## 2018-11-13 NOTE — ED Notes (Signed)
Discharge instructions discussed with Pt. Pt verbalized understanding. Pt stable and leaving via WC.    

## 2018-11-16 ENCOUNTER — Telehealth: Payer: Self-pay

## 2018-11-16 NOTE — Telephone Encounter (Signed)
Patient would like you to review her xray from ED visit and call her back.  Call back is [phone]  Danley Danker, RN [facility] RN)

## 2018-11-17 ENCOUNTER — Other Ambulatory Visit: Payer: Self-pay

## 2018-11-17 ENCOUNTER — Encounter: Payer: Self-pay | Admitting: Family Medicine

## 2018-11-17 ENCOUNTER — Ambulatory Visit (INDEPENDENT_AMBULATORY_CARE_PROVIDER_SITE_OTHER): Payer: Medicare Other | Admitting: Family Medicine

## 2018-11-17 VITALS — BP 148/88 | HR 72 | Temp 98.3°F | Ht 61.0 in

## 2018-11-17 DIAGNOSIS — S90822A Blister (nonthermal), left foot, initial encounter: Secondary | ICD-10-CM | POA: Diagnosis not present

## 2018-11-17 MED ORDER — BISACODYL 10 MG RE SUPP: 10.0000 mg | RECTAL | 0 refills | Status: DC | PRN

## 2018-11-17 NOTE — Progress Notes (Signed)
   CC: L foot injury + blister   HPI  L foot injury - Lives alone, fell at an office and tripped. Top and L lateral side of her foot hurts. Feels swollen. Not sure what may have rubbed on the blister spot, but this is where the ace bandage ended. Wonders if she needs something with more support. Needs to use her feet to transfer at home. No fever.   Patient also mentions some constipation, wanted to try a suppository. Has not tried suppositories yet. No abd pain.   ROS: Denies CP, SOB, abdominal pain, dysuria, changes in BMs.   CC, SH/smoking status, and VS noted  Objective: BP (!) 148/88   Pulse 72   Temp 98.3 F (36.8 C) (Oral)   Ht 5\' 1"  (1.549 m)   SpO2 99%   BMI 37.19 kg/m  Gen: NAD, alert, cooperative, and pleasant. HEENT: NCAT, EOMI, PERRL CV: RRR, no murmur Resp: CTAB, no wheezes, non-labored Ext: R foot no edema, warm. L foot with bruising to the lateral midfoot and distal to L lateral malleolus. Blister over midfoot as pictured.    Neuro: Alert and oriented, Speech clear, No gross deficits  Assessment and plan:  L ankle injury: suspect sprain based on x-ray without fracture.  Patient was wrapped in Ace wrap which I believe was rubbing and creating the blister on the midfoot.  We offered to lance the blister today, but patient declined.  We wrapped it with Telfa and Kerlix gauze.  I ordered a home health nurse to check on the wound over the next couple of weeks given the community efforts to social distance and keep patients from having unnecessary appointments.  She will call us if things worsen, and she has an appointment with Dr. Nori Riis in 3 weeks.  We would have a low threshold to treat for cellulitis if the wound nurse or patient calls with worsening redness.  Constipation: Will trial Dulcolax suppository.  She will call us if no improvement.  Orders Placed This Encounter  Procedures  . Ambulatory referral to Home Health    Referral Priority:   Routine    Referral  Type:   Home Health Care    Referral Reason:   Specialty Services Required    Requested Specialty:   [place]    Number of Visits Requested:   1    Meds ordered this encounter  Medications  . bisacodyl (DULCOLAX) 10 MG suppository    Sig: Place 1 suppository (10 mg total) rectally as needed for moderate constipation.    Dispense:  12 suppository    Refill:  0    Ralene Ok, MD, PGY3 11/18/2018 11:45 AM

## 2018-11-17 NOTE — Patient Instructions (Addendum)
You have a foot sprain, and the blister is likely due to rubbing. Please look for a call from the nurse to come to your house and check on your foot wound. Keep it wrapped until then. Please wear the shoe we gave you. Come back and see Dr. Nori Riis.

## 2018-11-19 ENCOUNTER — Telehealth: Payer: Self-pay

## 2018-11-19 NOTE — Telephone Encounter (Signed)
Spoke to [place] at [facility] [phone]) in [place].  Pat Patrick states that she has called patient on three different occassions to set up an appointment. Patient answers, but, apparently can not hear her for some reason.  Patient continues to say hello on her end then hangs up. Pat Patrick states that she is not sure if it is a bad connection all three times or if patient is hearing impaired.  Pat Patrick was given another number to get in touch with patient as she has a nurse who can go out tomorrow and visit patient.  Pat Patrick will try the alternate number in patient's chart and will call our office back if she is still having trouble reaching patient.  Ozella Almond, [place]

## 2018-11-19 NOTE — Telephone Encounter (Signed)
Nursing- please call [facility] at  [phone] and identify if they need more orders for this patient and expected time frame for starting services. Once you find out this information, please let the patient know.   Let me know if the patient has questions or if you have difficulty with contacting [facility].   Dorris Singh, MD  Family Medicine Teaching Service

## 2018-11-19 NOTE — Telephone Encounter (Signed)
Seen in office.  Danley Danker, RN [facility] RN)

## 2018-11-19 NOTE — Telephone Encounter (Signed)
Pt called nurse line stating she has yet to hear from [facility] about starting home health visits. Pt does not know what to do. The referral has been sent to [facility] with appropriate documentation. Will forward to MD. Looks like ordered by Lindell Noe under [place].

## 2018-11-20 ENCOUNTER — Telehealth: Payer: Self-pay | Admitting: Family Medicine

## 2018-11-20 DIAGNOSIS — H548 Legal blindness, as defined in USA: Secondary | ICD-10-CM | POA: Diagnosis not present

## 2018-11-20 DIAGNOSIS — E11621 Type 2 diabetes mellitus with foot ulcer: Secondary | ICD-10-CM | POA: Diagnosis not present

## 2018-11-20 DIAGNOSIS — L02612 Cutaneous abscess of left foot: Secondary | ICD-10-CM | POA: Diagnosis not present

## 2018-11-20 DIAGNOSIS — J449 Chronic obstructive pulmonary disease, unspecified: Secondary | ICD-10-CM | POA: Diagnosis not present

## 2018-11-20 DIAGNOSIS — M1990 Unspecified osteoarthritis, unspecified site: Secondary | ICD-10-CM | POA: Diagnosis not present

## 2018-11-20 DIAGNOSIS — L97521 Non-pressure chronic ulcer of other part of left foot limited to breakdown of skin: Secondary | ICD-10-CM | POA: Diagnosis not present

## 2018-11-20 DIAGNOSIS — S93402D Sprain of unspecified ligament of left ankle, subsequent encounter: Secondary | ICD-10-CM | POA: Diagnosis not present

## 2018-11-20 DIAGNOSIS — H905 Unspecified sensorineural hearing loss: Secondary | ICD-10-CM | POA: Diagnosis not present

## 2018-11-20 DIAGNOSIS — M160 Bilateral primary osteoarthritis of hip: Secondary | ICD-10-CM | POA: Diagnosis not present

## 2018-11-20 DIAGNOSIS — G5603 Carpal tunnel syndrome, bilateral upper limbs: Secondary | ICD-10-CM | POA: Diagnosis not present

## 2018-11-20 DIAGNOSIS — I10 Essential (primary) hypertension: Secondary | ICD-10-CM | POA: Diagnosis not present

## 2018-11-20 DIAGNOSIS — H409 Unspecified glaucoma: Secondary | ICD-10-CM | POA: Diagnosis not present

## 2018-11-20 DIAGNOSIS — G4733 Obstructive sleep apnea (adult) (pediatric): Secondary | ICD-10-CM | POA: Diagnosis not present

## 2018-11-20 MED ORDER — CEPHALEXIN 500 MG PO CAPS: 500.0000 mg | ORAL_CAPSULE | Freq: Four times a day (QID) | ORAL | 0 refills | Status: AC

## 2018-11-20 NOTE — Telephone Encounter (Addendum)
Telephone Encounter  PCP: Dickie La, MD Historian/Reporter: Patient's Home Health Nurse RN Herma Mering [phone] Call type: incoming 11/20/2018, 2:40 PM   CC: Boil needs to be drained.   [name] [name] is a 68 y.o. female who was seen recently by Dr. Lindell Noe (3/17) for left foot dorsal blister. Patient was not experiencing any pain at that time and declined lancing of blister. Patient was discharged home with Home Health for wound care.  Home health admission RN calling today to see if patient can be seen to get blister lanced as patient is complaining of pain which started last night. RN describes wound as serosanguinous fluid filled blister with mild induration in surround area. Nurse was able to compare her findings to picture taken at 3/17 visit and reports it appears more erythematous with a slightly purulent fluid with some possible blood in the clear fluid. No other chronic foot wounds. No history amputation.  Skin is still intact and no active draining.  Nurse reports that vital signs were stable on today's visit. She is afebrile, normotensive and with normal HR.   Plan Will scheduled patient to be seen for lancing. 3/23 at 11:10AM with Dr. Burr Medico. RN had already left patients home at time of call.   Called patient [name] [name] at [facility] phone [phone] about appointment to details.  As wound is described as worsening with mild purulence, will start patient on antibiotic course as outpatient.  Should wound spontaneously drain, patient aware that she will not have to be seen if no further pain and can continue care with Home Health. Patient aware of red flag symptoms (fever, chills, increased redness, tenderness to touch) and should seek care at emergency department if any of these develop.  Patient is aware of after hours line should they need further guidance.  Keflex 500 mg every 6 hours x5 days  Patient provided with appointment precautions as  follows: To avoid exposure in the setting of Novel Coronavirus precautions:   Patient to only bring one person to appointment   If children, please limit to one child.    If experiencing fevers, cough, or SOB, patient is to call the office prior to arriving for appointment   Zettie Cooley, M.D. [place]  PGY -1 11/20/2018, 2:40 PM

## 2018-11-23 ENCOUNTER — Encounter: Payer: Self-pay | Admitting: Student in an Organized Health Care Education/Training Program

## 2018-11-23 ENCOUNTER — Ambulatory Visit (INDEPENDENT_AMBULATORY_CARE_PROVIDER_SITE_OTHER): Payer: Medicare Other | Admitting: Student in an Organized Health Care Education/Training Program

## 2018-11-23 ENCOUNTER — Telehealth: Payer: Self-pay | Admitting: Family Medicine

## 2018-11-23 ENCOUNTER — Other Ambulatory Visit: Payer: Self-pay

## 2018-11-23 VITALS — BP 112/78 | HR 72 | Temp 98.2°F | Ht 61.0 in | Wt 193.2 lb

## 2018-11-23 DIAGNOSIS — T148XXA Other injury of unspecified body region, initial encounter: Secondary | ICD-10-CM | POA: Insufficient documentation

## 2018-11-23 NOTE — Patient Instructions (Signed)
It was a pleasure seeing you today in our clinic.  Please continue to monitor the blister on your foot.  I do not think that is infected.  Signs of infection would be if you noticed white thick drainage, redness, warmth, or fevers.  If you experience the symptoms please give our office a call.  Our clinic's number is [phone]. Please call with questions or concerns about what we discussed today.  Be well, Dr. Burr Medico

## 2018-11-23 NOTE — Telephone Encounter (Signed)
RN Belenda Cruise for [facility] is calling and would like verbal orders for pt  Skilled nursing 1 week 1, 2 weeks 4, 3prns  PT to evaluate and treat due to multiple falls  Silver protocol dressing left foot 2 times a week 4 weeks.    The best call back number is [phone].

## 2018-11-23 NOTE — Assessment & Plan Note (Signed)
See procedure note above for I&D of the bulla. Patient tolerated procedure well. Ace bandage was used to rewrap the ankle with care to avoid the area of the lesion. Return precautions including signs of infection were discussed.

## 2018-11-23 NOTE — Progress Notes (Signed)
   CC: blister  HPI: [name] [name] is a 68 y.o. female presenting for a foot blister.  Patient reports that she was in her usual state of health until last Sunday when she noted a blister on the top of her left foot.  She reports that she has recently experienced a fall and had a's bandaged her foot.  She reports that the Ace bandage was too tight and was causing friction that led to the blister on top of her foot.  She has not had any redness, warmth, or drainage in this area. She has not had fevers. She does have a history of diabetes and neuropathy.  The blisters bothersome for her and she would like it lanced today.  Review of Symptoms:  See HPI for ROS.   CC, SH/smoking status, and VS noted.  Objective: BP 112/78   Pulse 72   Temp 98.2 F (36.8 C) (Oral)   Ht 5\' 1"  (1.549 m)   Wt 193 lb 3.2 oz (87.6 kg)   SpO2 99%   BMI 36.50 kg/m  GEN: NAD, alert, cooperative, and pleasant. RESPIRATORY: Comfortable work of breathing, speaks in full sentences CV: Regular rate noted, distal extremities well perfused and warm without edema GI: Soft, nondistended SKIN: +3 cm in diameter bulla, no erythema or drainage, no warmth NEURO: II-XII grossly intact MSK: Moves 4 extremities equally PSYCH: AAOx3, appropriate affect  Procedure note Risks and benefits discussed with the patient. 3 cm in diameter bulla was cleaned with alcohol swabs x3. Cold spray was used at the patient's request. A 25 gauge 1 inch needle was inserted at the base of the bulla and serous drainage was expressed. A band aid was placed over the lesion. Patient tolerated the procedure well.   Assessment and plan:  Blister See procedure note above for I&D of the bulla. Patient tolerated procedure well. Ace bandage was used to rewrap the ankle with care to avoid the area of the lesion. Return precautions including signs of infection were discussed.  Everrett Coombe, MD,MS,  PGY3 11/23/2018 11:46 AM

## 2018-11-24 NOTE — Telephone Encounter (Signed)
Dear [name] Team Ok to give those verbal orders [facility]! [name]

## 2018-11-24 NOTE — Telephone Encounter (Signed)
Verbal orders given to [facility].  Jiyah Torpey,CMA

## 2018-11-25 ENCOUNTER — Telehealth: Payer: Self-pay | Admitting: Family Medicine

## 2018-11-25 DIAGNOSIS — L02612 Cutaneous abscess of left foot: Secondary | ICD-10-CM | POA: Diagnosis not present

## 2018-11-25 DIAGNOSIS — H548 Legal blindness, as defined in USA: Secondary | ICD-10-CM | POA: Diagnosis not present

## 2018-11-25 DIAGNOSIS — M1990 Unspecified osteoarthritis, unspecified site: Secondary | ICD-10-CM | POA: Diagnosis not present

## 2018-11-25 DIAGNOSIS — I10 Essential (primary) hypertension: Secondary | ICD-10-CM | POA: Diagnosis not present

## 2018-11-25 DIAGNOSIS — L97521 Non-pressure chronic ulcer of other part of left foot limited to breakdown of skin: Secondary | ICD-10-CM | POA: Diagnosis not present

## 2018-11-25 DIAGNOSIS — E11621 Type 2 diabetes mellitus with foot ulcer: Secondary | ICD-10-CM | POA: Diagnosis not present

## 2018-11-25 DIAGNOSIS — S93402D Sprain of unspecified ligament of left ankle, subsequent encounter: Secondary | ICD-10-CM | POA: Diagnosis not present

## 2018-11-25 DIAGNOSIS — H409 Unspecified glaucoma: Secondary | ICD-10-CM | POA: Diagnosis not present

## 2018-11-25 DIAGNOSIS — H905 Unspecified sensorineural hearing loss: Secondary | ICD-10-CM | POA: Diagnosis not present

## 2018-11-25 DIAGNOSIS — G5603 Carpal tunnel syndrome, bilateral upper limbs: Secondary | ICD-10-CM | POA: Diagnosis not present

## 2018-11-25 DIAGNOSIS — M160 Bilateral primary osteoarthritis of hip: Secondary | ICD-10-CM | POA: Diagnosis not present

## 2018-11-25 DIAGNOSIS — J449 Chronic obstructive pulmonary disease, unspecified: Secondary | ICD-10-CM | POA: Diagnosis not present

## 2018-11-25 DIAGNOSIS — G4733 Obstructive sleep apnea (adult) (pediatric): Secondary | ICD-10-CM | POA: Diagnosis not present

## 2018-11-25 NOTE — Telephone Encounter (Signed)
Tillie Rung from [facility] is calling for Verbal orders for home health.   They would like the pt to have PT  2 x's a week for 2 weeks 1 x's a week for 2 weeks.   The best call back number is [phone]/

## 2018-11-26 ENCOUNTER — Ambulatory Visit: Payer: Medicare Other | Admitting: Orthotics

## 2018-11-26 NOTE — Telephone Encounter (Signed)
Dear [name] Team Ok to give these verbal orders [facility]! [name]

## 2018-11-27 DIAGNOSIS — J449 Chronic obstructive pulmonary disease, unspecified: Secondary | ICD-10-CM | POA: Diagnosis not present

## 2018-11-27 DIAGNOSIS — E11621 Type 2 diabetes mellitus with foot ulcer: Secondary | ICD-10-CM | POA: Diagnosis not present

## 2018-11-27 DIAGNOSIS — G4733 Obstructive sleep apnea (adult) (pediatric): Secondary | ICD-10-CM | POA: Diagnosis not present

## 2018-11-27 DIAGNOSIS — M1990 Unspecified osteoarthritis, unspecified site: Secondary | ICD-10-CM | POA: Diagnosis not present

## 2018-11-27 DIAGNOSIS — H905 Unspecified sensorineural hearing loss: Secondary | ICD-10-CM | POA: Diagnosis not present

## 2018-11-27 DIAGNOSIS — H548 Legal blindness, as defined in USA: Secondary | ICD-10-CM | POA: Diagnosis not present

## 2018-11-27 DIAGNOSIS — G5603 Carpal tunnel syndrome, bilateral upper limbs: Secondary | ICD-10-CM | POA: Diagnosis not present

## 2018-11-27 DIAGNOSIS — H409 Unspecified glaucoma: Secondary | ICD-10-CM | POA: Diagnosis not present

## 2018-11-27 DIAGNOSIS — M160 Bilateral primary osteoarthritis of hip: Secondary | ICD-10-CM | POA: Diagnosis not present

## 2018-11-27 DIAGNOSIS — S93402D Sprain of unspecified ligament of left ankle, subsequent encounter: Secondary | ICD-10-CM | POA: Diagnosis not present

## 2018-11-27 DIAGNOSIS — I10 Essential (primary) hypertension: Secondary | ICD-10-CM | POA: Diagnosis not present

## 2018-11-27 DIAGNOSIS — L97521 Non-pressure chronic ulcer of other part of left foot limited to breakdown of skin: Secondary | ICD-10-CM | POA: Diagnosis not present

## 2018-11-27 DIAGNOSIS — L02612 Cutaneous abscess of left foot: Secondary | ICD-10-CM | POA: Diagnosis not present

## 2018-11-27 NOTE — Telephone Encounter (Signed)
Verbal orders given  

## 2018-11-30 DIAGNOSIS — H409 Unspecified glaucoma: Secondary | ICD-10-CM | POA: Diagnosis not present

## 2018-11-30 DIAGNOSIS — H548 Legal blindness, as defined in USA: Secondary | ICD-10-CM | POA: Diagnosis not present

## 2018-11-30 DIAGNOSIS — M1990 Unspecified osteoarthritis, unspecified site: Secondary | ICD-10-CM | POA: Diagnosis not present

## 2018-11-30 DIAGNOSIS — H905 Unspecified sensorineural hearing loss: Secondary | ICD-10-CM | POA: Diagnosis not present

## 2018-11-30 DIAGNOSIS — J449 Chronic obstructive pulmonary disease, unspecified: Secondary | ICD-10-CM | POA: Diagnosis not present

## 2018-11-30 DIAGNOSIS — L02612 Cutaneous abscess of left foot: Secondary | ICD-10-CM | POA: Diagnosis not present

## 2018-11-30 DIAGNOSIS — G5603 Carpal tunnel syndrome, bilateral upper limbs: Secondary | ICD-10-CM | POA: Diagnosis not present

## 2018-11-30 DIAGNOSIS — I10 Essential (primary) hypertension: Secondary | ICD-10-CM | POA: Diagnosis not present

## 2018-11-30 DIAGNOSIS — Z8631 Personal history of diabetic foot ulcer: Secondary | ICD-10-CM | POA: Diagnosis not present

## 2018-11-30 DIAGNOSIS — L97521 Non-pressure chronic ulcer of other part of left foot limited to breakdown of skin: Secondary | ICD-10-CM | POA: Diagnosis not present

## 2018-11-30 DIAGNOSIS — S93402D Sprain of unspecified ligament of left ankle, subsequent encounter: Secondary | ICD-10-CM | POA: Diagnosis not present

## 2018-11-30 DIAGNOSIS — G4733 Obstructive sleep apnea (adult) (pediatric): Secondary | ICD-10-CM | POA: Diagnosis not present

## 2018-11-30 DIAGNOSIS — M160 Bilateral primary osteoarthritis of hip: Secondary | ICD-10-CM | POA: Diagnosis not present

## 2018-11-30 DIAGNOSIS — E11621 Type 2 diabetes mellitus with foot ulcer: Secondary | ICD-10-CM | POA: Diagnosis not present

## 2018-11-30 NOTE — Telephone Encounter (Signed)
Tillie Rung called back to check on verbal orders.  She dose not recall receiving verbal orders on Friday. Given again. Christen Bame, CMA

## 2018-12-01 ENCOUNTER — Telehealth (INDEPENDENT_AMBULATORY_CARE_PROVIDER_SITE_OTHER): Payer: Medicare Other | Admitting: Family Medicine

## 2018-12-01 ENCOUNTER — Other Ambulatory Visit: Payer: Self-pay

## 2018-12-01 DIAGNOSIS — E1169 Type 2 diabetes mellitus with other specified complication: Secondary | ICD-10-CM

## 2018-12-01 DIAGNOSIS — J449 Chronic obstructive pulmonary disease, unspecified: Secondary | ICD-10-CM | POA: Diagnosis not present

## 2018-12-01 DIAGNOSIS — G5603 Carpal tunnel syndrome, bilateral upper limbs: Secondary | ICD-10-CM | POA: Diagnosis not present

## 2018-12-01 DIAGNOSIS — S93402D Sprain of unspecified ligament of left ankle, subsequent encounter: Secondary | ICD-10-CM | POA: Diagnosis not present

## 2018-12-01 DIAGNOSIS — L02612 Cutaneous abscess of left foot: Secondary | ICD-10-CM | POA: Diagnosis not present

## 2018-12-01 DIAGNOSIS — H409 Unspecified glaucoma: Secondary | ICD-10-CM | POA: Diagnosis not present

## 2018-12-01 DIAGNOSIS — I10 Essential (primary) hypertension: Secondary | ICD-10-CM | POA: Diagnosis not present

## 2018-12-01 DIAGNOSIS — L97529 Non-pressure chronic ulcer of other part of left foot with unspecified severity: Secondary | ICD-10-CM | POA: Diagnosis not present

## 2018-12-01 DIAGNOSIS — G4733 Obstructive sleep apnea (adult) (pediatric): Secondary | ICD-10-CM | POA: Diagnosis not present

## 2018-12-01 DIAGNOSIS — L97521 Non-pressure chronic ulcer of other part of left foot limited to breakdown of skin: Secondary | ICD-10-CM | POA: Diagnosis not present

## 2018-12-01 DIAGNOSIS — M1990 Unspecified osteoarthritis, unspecified site: Secondary | ICD-10-CM | POA: Diagnosis not present

## 2018-12-01 DIAGNOSIS — H905 Unspecified sensorineural hearing loss: Secondary | ICD-10-CM | POA: Diagnosis not present

## 2018-12-01 DIAGNOSIS — M160 Bilateral primary osteoarthritis of hip: Secondary | ICD-10-CM | POA: Diagnosis not present

## 2018-12-01 DIAGNOSIS — E11621 Type 2 diabetes mellitus with foot ulcer: Secondary | ICD-10-CM | POA: Diagnosis not present

## 2018-12-01 DIAGNOSIS — H548 Legal blindness, as defined in USA: Secondary | ICD-10-CM | POA: Diagnosis not present

## 2018-12-01 NOTE — Progress Notes (Signed)
[place] Telemedicine Visit  Patient consented to have visit conducted via telephone.  Encounter participants: Patient: [name] [name]  Provider: Andrena Mews    Chief Complaint: Wound check/DM2  HPI: Wound: I called the patient to discuss her left foot wound for which she had debridement done a few days ago. Since then, she has been getting a nurse visit for wound check twice a week. Her nurse came in today for a wound check and a general checkup. She was informed that her wound looks good. She denies any drainage, no foul smell, still painful but not worse. She denies fever. Her BP during her nurse visit appointment today was 141/80. She denies any new concerns. She will like to know if she can take a shower with her dressing. The patient also mentioned that Dr. Nori [name] prescribed a boot for her ulcer to keep weight off it, she will like to know when to d/c boot.  DM: She is compliant with her meds, and her home CBG ranges between 83 and 153. She denies hypoglycemic symptoms.  ROS: Review of Systems  Constitutional: Negative for fever.  Musculoskeletal:       Left foot wound  All other systems reviewed and are negative.     Pertinent PMHx:Problem list reviewed.  Exam:  Respiratory: She does not sound like she is in any respiratory distress.  Assessment/Plan:  Left foot ulcer. It sounds like it is getting better per the patient. Red flag signs discussed including, swelling, redness, foul-smelling discharge, or any change in color. Keep the wound clean and dry. Continue twice weekly nurse wound check. Avoid water on the wound unless she plans to redress the wound right away. I was unable to find any documentation about the boot that was prescribed by her PCP. I guess that she will need to keep this on till she heals completely. However, I advised her that I will send her PCP a message to give her a follow-up call on that.  She agreed with the  plan.  DM2: It seems stable. Continue the current regimen.  Time spent on phone with patient: 18 minutes

## 2018-12-02 ENCOUNTER — Ambulatory Visit: Payer: Self-pay | Admitting: Family Medicine

## 2018-12-02 DIAGNOSIS — I10 Essential (primary) hypertension: Secondary | ICD-10-CM | POA: Diagnosis not present

## 2018-12-02 DIAGNOSIS — S93402D Sprain of unspecified ligament of left ankle, subsequent encounter: Secondary | ICD-10-CM | POA: Diagnosis not present

## 2018-12-02 DIAGNOSIS — H409 Unspecified glaucoma: Secondary | ICD-10-CM | POA: Diagnosis not present

## 2018-12-02 DIAGNOSIS — J449 Chronic obstructive pulmonary disease, unspecified: Secondary | ICD-10-CM | POA: Diagnosis not present

## 2018-12-02 DIAGNOSIS — H905 Unspecified sensorineural hearing loss: Secondary | ICD-10-CM | POA: Diagnosis not present

## 2018-12-02 DIAGNOSIS — H548 Legal blindness, as defined in USA: Secondary | ICD-10-CM | POA: Diagnosis not present

## 2018-12-02 DIAGNOSIS — G5603 Carpal tunnel syndrome, bilateral upper limbs: Secondary | ICD-10-CM | POA: Diagnosis not present

## 2018-12-02 DIAGNOSIS — E11621 Type 2 diabetes mellitus with foot ulcer: Secondary | ICD-10-CM | POA: Diagnosis not present

## 2018-12-02 DIAGNOSIS — G4733 Obstructive sleep apnea (adult) (pediatric): Secondary | ICD-10-CM | POA: Diagnosis not present

## 2018-12-02 DIAGNOSIS — M1990 Unspecified osteoarthritis, unspecified site: Secondary | ICD-10-CM | POA: Diagnosis not present

## 2018-12-02 DIAGNOSIS — M160 Bilateral primary osteoarthritis of hip: Secondary | ICD-10-CM | POA: Diagnosis not present

## 2018-12-02 DIAGNOSIS — L97521 Non-pressure chronic ulcer of other part of left foot limited to breakdown of skin: Secondary | ICD-10-CM | POA: Diagnosis not present

## 2018-12-02 DIAGNOSIS — L02612 Cutaneous abscess of left foot: Secondary | ICD-10-CM | POA: Diagnosis not present

## 2018-12-03 DIAGNOSIS — L97521 Non-pressure chronic ulcer of other part of left foot limited to breakdown of skin: Secondary | ICD-10-CM | POA: Diagnosis not present

## 2018-12-03 DIAGNOSIS — M1990 Unspecified osteoarthritis, unspecified site: Secondary | ICD-10-CM | POA: Diagnosis not present

## 2018-12-03 DIAGNOSIS — J449 Chronic obstructive pulmonary disease, unspecified: Secondary | ICD-10-CM | POA: Diagnosis not present

## 2018-12-03 DIAGNOSIS — H548 Legal blindness, as defined in USA: Secondary | ICD-10-CM | POA: Diagnosis not present

## 2018-12-03 DIAGNOSIS — G5603 Carpal tunnel syndrome, bilateral upper limbs: Secondary | ICD-10-CM | POA: Diagnosis not present

## 2018-12-03 DIAGNOSIS — E11621 Type 2 diabetes mellitus with foot ulcer: Secondary | ICD-10-CM | POA: Diagnosis not present

## 2018-12-03 DIAGNOSIS — S93402D Sprain of unspecified ligament of left ankle, subsequent encounter: Secondary | ICD-10-CM | POA: Diagnosis not present

## 2018-12-03 DIAGNOSIS — H409 Unspecified glaucoma: Secondary | ICD-10-CM | POA: Diagnosis not present

## 2018-12-03 DIAGNOSIS — I10 Essential (primary) hypertension: Secondary | ICD-10-CM | POA: Diagnosis not present

## 2018-12-03 DIAGNOSIS — G4733 Obstructive sleep apnea (adult) (pediatric): Secondary | ICD-10-CM | POA: Diagnosis not present

## 2018-12-03 DIAGNOSIS — M160 Bilateral primary osteoarthritis of hip: Secondary | ICD-10-CM | POA: Diagnosis not present

## 2018-12-03 DIAGNOSIS — L02612 Cutaneous abscess of left foot: Secondary | ICD-10-CM | POA: Diagnosis not present

## 2018-12-03 DIAGNOSIS — H905 Unspecified sensorineural hearing loss: Secondary | ICD-10-CM | POA: Diagnosis not present

## 2018-12-07 DIAGNOSIS — J449 Chronic obstructive pulmonary disease, unspecified: Secondary | ICD-10-CM | POA: Diagnosis not present

## 2018-12-07 DIAGNOSIS — H548 Legal blindness, as defined in USA: Secondary | ICD-10-CM | POA: Diagnosis not present

## 2018-12-07 DIAGNOSIS — L02612 Cutaneous abscess of left foot: Secondary | ICD-10-CM | POA: Diagnosis not present

## 2018-12-07 DIAGNOSIS — I10 Essential (primary) hypertension: Secondary | ICD-10-CM | POA: Diagnosis not present

## 2018-12-07 DIAGNOSIS — H409 Unspecified glaucoma: Secondary | ICD-10-CM | POA: Diagnosis not present

## 2018-12-07 DIAGNOSIS — M1990 Unspecified osteoarthritis, unspecified site: Secondary | ICD-10-CM | POA: Diagnosis not present

## 2018-12-07 DIAGNOSIS — G4733 Obstructive sleep apnea (adult) (pediatric): Secondary | ICD-10-CM | POA: Diagnosis not present

## 2018-12-07 DIAGNOSIS — M160 Bilateral primary osteoarthritis of hip: Secondary | ICD-10-CM | POA: Diagnosis not present

## 2018-12-07 DIAGNOSIS — E11621 Type 2 diabetes mellitus with foot ulcer: Secondary | ICD-10-CM | POA: Diagnosis not present

## 2018-12-07 DIAGNOSIS — H905 Unspecified sensorineural hearing loss: Secondary | ICD-10-CM | POA: Diagnosis not present

## 2018-12-07 DIAGNOSIS — G5603 Carpal tunnel syndrome, bilateral upper limbs: Secondary | ICD-10-CM | POA: Diagnosis not present

## 2018-12-07 DIAGNOSIS — S93402D Sprain of unspecified ligament of left ankle, subsequent encounter: Secondary | ICD-10-CM | POA: Diagnosis not present

## 2018-12-07 DIAGNOSIS — L97521 Non-pressure chronic ulcer of other part of left foot limited to breakdown of skin: Secondary | ICD-10-CM | POA: Diagnosis not present

## 2018-12-08 DIAGNOSIS — L97521 Non-pressure chronic ulcer of other part of left foot limited to breakdown of skin: Secondary | ICD-10-CM | POA: Diagnosis not present

## 2018-12-08 DIAGNOSIS — G4733 Obstructive sleep apnea (adult) (pediatric): Secondary | ICD-10-CM | POA: Diagnosis not present

## 2018-12-08 DIAGNOSIS — M160 Bilateral primary osteoarthritis of hip: Secondary | ICD-10-CM | POA: Diagnosis not present

## 2018-12-08 DIAGNOSIS — I10 Essential (primary) hypertension: Secondary | ICD-10-CM | POA: Diagnosis not present

## 2018-12-08 DIAGNOSIS — M1990 Unspecified osteoarthritis, unspecified site: Secondary | ICD-10-CM | POA: Diagnosis not present

## 2018-12-08 DIAGNOSIS — L02612 Cutaneous abscess of left foot: Secondary | ICD-10-CM | POA: Diagnosis not present

## 2018-12-08 DIAGNOSIS — H548 Legal blindness, as defined in USA: Secondary | ICD-10-CM | POA: Diagnosis not present

## 2018-12-08 DIAGNOSIS — E11621 Type 2 diabetes mellitus with foot ulcer: Secondary | ICD-10-CM | POA: Diagnosis not present

## 2018-12-08 DIAGNOSIS — S93402D Sprain of unspecified ligament of left ankle, subsequent encounter: Secondary | ICD-10-CM | POA: Diagnosis not present

## 2018-12-08 DIAGNOSIS — J449 Chronic obstructive pulmonary disease, unspecified: Secondary | ICD-10-CM | POA: Diagnosis not present

## 2018-12-08 DIAGNOSIS — G5603 Carpal tunnel syndrome, bilateral upper limbs: Secondary | ICD-10-CM | POA: Diagnosis not present

## 2018-12-08 DIAGNOSIS — H409 Unspecified glaucoma: Secondary | ICD-10-CM | POA: Diagnosis not present

## 2018-12-08 DIAGNOSIS — H905 Unspecified sensorineural hearing loss: Secondary | ICD-10-CM | POA: Diagnosis not present

## 2018-12-10 DIAGNOSIS — L97521 Non-pressure chronic ulcer of other part of left foot limited to breakdown of skin: Secondary | ICD-10-CM | POA: Diagnosis not present

## 2018-12-10 DIAGNOSIS — H409 Unspecified glaucoma: Secondary | ICD-10-CM | POA: Diagnosis not present

## 2018-12-10 DIAGNOSIS — M1990 Unspecified osteoarthritis, unspecified site: Secondary | ICD-10-CM | POA: Diagnosis not present

## 2018-12-10 DIAGNOSIS — H905 Unspecified sensorineural hearing loss: Secondary | ICD-10-CM | POA: Diagnosis not present

## 2018-12-10 DIAGNOSIS — H548 Legal blindness, as defined in USA: Secondary | ICD-10-CM | POA: Diagnosis not present

## 2018-12-10 DIAGNOSIS — I10 Essential (primary) hypertension: Secondary | ICD-10-CM | POA: Diagnosis not present

## 2018-12-10 DIAGNOSIS — M160 Bilateral primary osteoarthritis of hip: Secondary | ICD-10-CM | POA: Diagnosis not present

## 2018-12-10 DIAGNOSIS — J449 Chronic obstructive pulmonary disease, unspecified: Secondary | ICD-10-CM | POA: Diagnosis not present

## 2018-12-10 DIAGNOSIS — G5603 Carpal tunnel syndrome, bilateral upper limbs: Secondary | ICD-10-CM | POA: Diagnosis not present

## 2018-12-10 DIAGNOSIS — S93402D Sprain of unspecified ligament of left ankle, subsequent encounter: Secondary | ICD-10-CM | POA: Diagnosis not present

## 2018-12-10 DIAGNOSIS — G4733 Obstructive sleep apnea (adult) (pediatric): Secondary | ICD-10-CM | POA: Diagnosis not present

## 2018-12-10 DIAGNOSIS — L02612 Cutaneous abscess of left foot: Secondary | ICD-10-CM | POA: Diagnosis not present

## 2018-12-10 DIAGNOSIS — E11621 Type 2 diabetes mellitus with foot ulcer: Secondary | ICD-10-CM | POA: Diagnosis not present

## 2018-12-11 DIAGNOSIS — G5603 Carpal tunnel syndrome, bilateral upper limbs: Secondary | ICD-10-CM | POA: Diagnosis not present

## 2018-12-11 DIAGNOSIS — H548 Legal blindness, as defined in USA: Secondary | ICD-10-CM | POA: Diagnosis not present

## 2018-12-11 DIAGNOSIS — H905 Unspecified sensorineural hearing loss: Secondary | ICD-10-CM | POA: Diagnosis not present

## 2018-12-11 DIAGNOSIS — L97521 Non-pressure chronic ulcer of other part of left foot limited to breakdown of skin: Secondary | ICD-10-CM | POA: Diagnosis not present

## 2018-12-11 DIAGNOSIS — H409 Unspecified glaucoma: Secondary | ICD-10-CM | POA: Diagnosis not present

## 2018-12-11 DIAGNOSIS — I10 Essential (primary) hypertension: Secondary | ICD-10-CM | POA: Diagnosis not present

## 2018-12-11 DIAGNOSIS — S93402D Sprain of unspecified ligament of left ankle, subsequent encounter: Secondary | ICD-10-CM | POA: Diagnosis not present

## 2018-12-11 DIAGNOSIS — G4733 Obstructive sleep apnea (adult) (pediatric): Secondary | ICD-10-CM | POA: Diagnosis not present

## 2018-12-11 DIAGNOSIS — M1990 Unspecified osteoarthritis, unspecified site: Secondary | ICD-10-CM | POA: Diagnosis not present

## 2018-12-11 DIAGNOSIS — J449 Chronic obstructive pulmonary disease, unspecified: Secondary | ICD-10-CM | POA: Diagnosis not present

## 2018-12-11 DIAGNOSIS — E11621 Type 2 diabetes mellitus with foot ulcer: Secondary | ICD-10-CM | POA: Diagnosis not present

## 2018-12-11 DIAGNOSIS — L02612 Cutaneous abscess of left foot: Secondary | ICD-10-CM | POA: Diagnosis not present

## 2018-12-11 DIAGNOSIS — M160 Bilateral primary osteoarthritis of hip: Secondary | ICD-10-CM | POA: Diagnosis not present

## 2018-12-15 DIAGNOSIS — H905 Unspecified sensorineural hearing loss: Secondary | ICD-10-CM | POA: Diagnosis not present

## 2018-12-15 DIAGNOSIS — E11621 Type 2 diabetes mellitus with foot ulcer: Secondary | ICD-10-CM | POA: Diagnosis not present

## 2018-12-15 DIAGNOSIS — M1990 Unspecified osteoarthritis, unspecified site: Secondary | ICD-10-CM | POA: Diagnosis not present

## 2018-12-15 DIAGNOSIS — H409 Unspecified glaucoma: Secondary | ICD-10-CM | POA: Diagnosis not present

## 2018-12-15 DIAGNOSIS — H548 Legal blindness, as defined in USA: Secondary | ICD-10-CM | POA: Diagnosis not present

## 2018-12-15 DIAGNOSIS — G5603 Carpal tunnel syndrome, bilateral upper limbs: Secondary | ICD-10-CM | POA: Diagnosis not present

## 2018-12-15 DIAGNOSIS — G4733 Obstructive sleep apnea (adult) (pediatric): Secondary | ICD-10-CM | POA: Diagnosis not present

## 2018-12-15 DIAGNOSIS — L02612 Cutaneous abscess of left foot: Secondary | ICD-10-CM | POA: Diagnosis not present

## 2018-12-15 DIAGNOSIS — M160 Bilateral primary osteoarthritis of hip: Secondary | ICD-10-CM | POA: Diagnosis not present

## 2018-12-15 DIAGNOSIS — L97521 Non-pressure chronic ulcer of other part of left foot limited to breakdown of skin: Secondary | ICD-10-CM | POA: Diagnosis not present

## 2018-12-15 DIAGNOSIS — I10 Essential (primary) hypertension: Secondary | ICD-10-CM | POA: Diagnosis not present

## 2018-12-15 DIAGNOSIS — S93402D Sprain of unspecified ligament of left ankle, subsequent encounter: Secondary | ICD-10-CM | POA: Diagnosis not present

## 2018-12-15 DIAGNOSIS — J449 Chronic obstructive pulmonary disease, unspecified: Secondary | ICD-10-CM | POA: Diagnosis not present

## 2018-12-16 DIAGNOSIS — L97521 Non-pressure chronic ulcer of other part of left foot limited to breakdown of skin: Secondary | ICD-10-CM | POA: Diagnosis not present

## 2018-12-16 DIAGNOSIS — L02612 Cutaneous abscess of left foot: Secondary | ICD-10-CM | POA: Diagnosis not present

## 2018-12-16 DIAGNOSIS — S93402D Sprain of unspecified ligament of left ankle, subsequent encounter: Secondary | ICD-10-CM | POA: Diagnosis not present

## 2018-12-16 DIAGNOSIS — H548 Legal blindness, as defined in USA: Secondary | ICD-10-CM | POA: Diagnosis not present

## 2018-12-16 DIAGNOSIS — J449 Chronic obstructive pulmonary disease, unspecified: Secondary | ICD-10-CM | POA: Diagnosis not present

## 2018-12-16 DIAGNOSIS — M1990 Unspecified osteoarthritis, unspecified site: Secondary | ICD-10-CM | POA: Diagnosis not present

## 2018-12-16 DIAGNOSIS — H409 Unspecified glaucoma: Secondary | ICD-10-CM | POA: Diagnosis not present

## 2018-12-16 DIAGNOSIS — G5603 Carpal tunnel syndrome, bilateral upper limbs: Secondary | ICD-10-CM | POA: Diagnosis not present

## 2018-12-16 DIAGNOSIS — M160 Bilateral primary osteoarthritis of hip: Secondary | ICD-10-CM | POA: Diagnosis not present

## 2018-12-16 DIAGNOSIS — E11621 Type 2 diabetes mellitus with foot ulcer: Secondary | ICD-10-CM | POA: Diagnosis not present

## 2018-12-16 DIAGNOSIS — H905 Unspecified sensorineural hearing loss: Secondary | ICD-10-CM | POA: Diagnosis not present

## 2018-12-16 DIAGNOSIS — G4733 Obstructive sleep apnea (adult) (pediatric): Secondary | ICD-10-CM | POA: Diagnosis not present

## 2018-12-16 DIAGNOSIS — I10 Essential (primary) hypertension: Secondary | ICD-10-CM | POA: Diagnosis not present

## 2018-12-17 ENCOUNTER — Telehealth: Payer: Self-pay | Admitting: *Deleted

## 2018-12-17 DIAGNOSIS — S93402D Sprain of unspecified ligament of left ankle, subsequent encounter: Secondary | ICD-10-CM | POA: Diagnosis not present

## 2018-12-17 DIAGNOSIS — M160 Bilateral primary osteoarthritis of hip: Secondary | ICD-10-CM | POA: Diagnosis not present

## 2018-12-17 DIAGNOSIS — G5603 Carpal tunnel syndrome, bilateral upper limbs: Secondary | ICD-10-CM | POA: Diagnosis not present

## 2018-12-17 DIAGNOSIS — L97521 Non-pressure chronic ulcer of other part of left foot limited to breakdown of skin: Secondary | ICD-10-CM | POA: Diagnosis not present

## 2018-12-17 DIAGNOSIS — J449 Chronic obstructive pulmonary disease, unspecified: Secondary | ICD-10-CM | POA: Diagnosis not present

## 2018-12-17 DIAGNOSIS — L02612 Cutaneous abscess of left foot: Secondary | ICD-10-CM | POA: Diagnosis not present

## 2018-12-17 DIAGNOSIS — H548 Legal blindness, as defined in USA: Secondary | ICD-10-CM | POA: Diagnosis not present

## 2018-12-17 DIAGNOSIS — H905 Unspecified sensorineural hearing loss: Secondary | ICD-10-CM | POA: Diagnosis not present

## 2018-12-17 DIAGNOSIS — I10 Essential (primary) hypertension: Secondary | ICD-10-CM | POA: Diagnosis not present

## 2018-12-17 DIAGNOSIS — H409 Unspecified glaucoma: Secondary | ICD-10-CM | POA: Diagnosis not present

## 2018-12-17 DIAGNOSIS — G4733 Obstructive sleep apnea (adult) (pediatric): Secondary | ICD-10-CM | POA: Diagnosis not present

## 2018-12-17 DIAGNOSIS — M1990 Unspecified osteoarthritis, unspecified site: Secondary | ICD-10-CM | POA: Diagnosis not present

## 2018-12-17 DIAGNOSIS — E11621 Type 2 diabetes mellitus with foot ulcer: Secondary | ICD-10-CM | POA: Diagnosis not present

## 2018-12-17 NOTE — Telephone Encounter (Signed)
Pt is calling to check the status of forms her son dropped off with Kennyth Lose.   Nothing in PCPs box.  Will check with Kennyth Lose when she returns tomorrow and will have Kennyth Lose call her back.  Pt agreeable with plan. Christen Bame, CMA

## 2018-12-21 ENCOUNTER — Encounter: Payer: Self-pay | Admitting: Family Medicine

## 2018-12-23 NOTE — Telephone Encounter (Signed)
I talked with patient and informed her that the papers have been faxed and will give her a copy on Monday the 27 th for her records. She understood and will get copies at that time. jw

## 2018-12-27 ENCOUNTER — Other Ambulatory Visit: Payer: Self-pay | Admitting: Family Medicine

## 2018-12-28 ENCOUNTER — Ambulatory Visit (INDEPENDENT_AMBULATORY_CARE_PROVIDER_SITE_OTHER): Payer: Medicare Other | Admitting: Family Medicine

## 2018-12-28 ENCOUNTER — Ambulatory Visit
Admission: RE | Admit: 2018-12-28 | Discharge: 2018-12-28 | Disposition: A | Discharge status: Home / Self Care | Payer: Medicare Other | Source: Ambulatory Visit | Attending: Family Medicine | Admitting: Family Medicine

## 2018-12-28 ENCOUNTER — Other Ambulatory Visit: Payer: Self-pay

## 2018-12-28 VITALS — BP 128/62 | HR 81 | Temp 98.1°F | Ht 61.0 in | Wt 193.4 lb

## 2018-12-28 DIAGNOSIS — M79672 Pain in left foot: Secondary | ICD-10-CM | POA: Diagnosis not present

## 2018-12-28 NOTE — Progress Notes (Signed)
    Subjective:    Patient ID: [name] [name], female    DOB: 1950-11-23, 68 y.o.   MRN: [id number]    CC: left foot pain  HPI: from chart review patient had fallen at home 11/13/18 and injured left foot (had negative x-rays on 3/13), applied ace bandage too tight and developed wound on top of foot. Blister lanced in office 3/23. Home health RN has been coming out to dress it. She has been wearing a boot.  Today she states she still has left foot pain and swelling. She is unable to bear her full weight on her left foot without pain. She has been elevating her foot and icing it. She is unable to pinpoint a spot that hurts the most but points out that the lateral side of her left foot and across the top hurt the most. The blister that was lanced has healed well. She is wearing boot daily, able to walk but it hurts. She is using tylenol as needed for pain.  Smoking status reviewed- former smoker  Review of Systems- no fevers, chills, calf pain, syncope  Objective:  BP 128/62   Pulse 81   Temp 98.1 F (36.7 C) (Oral)   Ht 5\' 1"  (1.549 m)   Wt 193 lb 6.4 oz (87.7 kg)   SpO2 99%   BMI 36.54 kg/m  Vitals and nursing note reviewed  General: well nourished, in no acute distress HEENT: normocephalic, MMM Cardiac: regular rate Respiratory: no increased work of breathing Extremities: left forefoot swollen, tender to palpation along 5th metatarsal. Limited ROM in flexion/extension/eversion/inversion of ankle. Neurovascularly in tact distally. Neuro: alert and oriented, no focal deficits   Assessment & Plan:   1. Left foot pain Now about 6 weeks out from fall still with pain and swelling of left foot. Unable to bear full weight. Will repeat x-rays. Considering referral to ortho or sports med pending x-ray results and further discussion with patient. Continue ice, elevation, walking boot. - DG Ankle Complete Left; Future - DG Foot Complete Left; Future    Return if symptoms worsen  or fail to improve.   Lucila Maine, DO Family Medicine Resident PGY-3

## 2018-12-28 NOTE — Patient Instructions (Signed)
  Good to see you today! Let's x-ray your ankle and foot again to see what's going on. We'll call you with results. Keep icing and elevating it, use tylenol as needed for pain Wear your velcro shoe.  If you have questions or concerns please do not hesitate to call at [phone].  Lucila Maine, DO PGY-3, [facility] 12/28/2018 11:25 AM

## 2018-12-29 NOTE — Progress Notes (Signed)
Left voicemail for [name] [name] regarding her x-rays. No fractures. Some soft tissue swelling. Discussed with sports med fellow. We'll give her another 2 weeks to see how she does, continue wearing the post-op shoe she has while awake, try to keep weight off the foot, elevate it, continue ice and tylenol. If no improvement in 2 weeks follow up with [place].

## 2019-01-15 ENCOUNTER — Ambulatory Visit (INDEPENDENT_AMBULATORY_CARE_PROVIDER_SITE_OTHER): Payer: Medicare Other | Admitting: Family Medicine

## 2019-01-15 ENCOUNTER — Encounter: Payer: Self-pay | Admitting: Family Medicine

## 2019-01-15 ENCOUNTER — Other Ambulatory Visit: Payer: Self-pay

## 2019-01-15 VITALS — BP 124/62 | HR 82

## 2019-01-15 DIAGNOSIS — M79672 Pain in left foot: Secondary | ICD-10-CM | POA: Diagnosis not present

## 2019-01-15 DIAGNOSIS — R269 Unspecified abnormalities of gait and mobility: Secondary | ICD-10-CM

## 2019-01-15 DIAGNOSIS — E1149 Type 2 diabetes mellitus with other diabetic neurological complication: Secondary | ICD-10-CM | POA: Diagnosis not present

## 2019-01-15 LAB — POCT GLYCOSYLATED HEMOGLOBIN (HGB A1C): HbA1c, POC (controlled diabetic range): 5.7 % (ref 0.0–7.0)

## 2019-01-15 MED ORDER — EZETIMIBE 10 MG PO TABS: 10.0000 mg | ORAL_TABLET | Freq: Every day | ORAL | 3 refills | Status: DC

## 2019-01-15 MED ORDER — ESOMEPRAZOLE MAGNESIUM 40 MG PO CPDR: DELAYED_RELEASE_CAPSULE | ORAL | 3 refills | Status: DC

## 2019-01-15 MED ORDER — "INSULIN SYRINGE-NEEDLE U-100 31G X 15/64"" 0.5 ML MISC": 2 refills | Status: DC

## 2019-01-15 NOTE — Progress Notes (Signed)
    CHIEF COMPLAINT / HPI: 1.  Foot pain left: Improved by about 50%.  Still hurts if she stands up for long time.  She has been trying to wean out of the postop shoe a little bit.  Wants to know if she needs a brace or stocking or something to continue to wear as she weans out. 2.  Unfortunately her summer camp has been canceled. 3.  Diabetes mellitus: No episodes of low blood sugar.  No increased frequency of urination.  Has felt pretty well.  No unusual weight loss.  Taking her medicines regularly without problem.  She does need some refills on her needles 4.  Stress: Her son lives with her for a month while she was really having trouble with her foot she says that was pretty stressful as she is gotten used to living alone.  He has been back out again and she is now back by herself and she is more comfortable.  REVIEW OF SYSTEMS: See HPI  PERTINENT  PMH / PSH: I have reviewed the patient's medications, allergies, past medical and surgical history, smoking status and updated in the EMR as appropriate. Blind  OBJECTIVE: Vital signs reviewed. GENERAL: Well-developed, well-nourished, no acute distress. CARDIOVASCULAR: Regular rate and rhythm no murmur gallop or rub LUNGS: Clear to auscultation bilaterally, no rales or wheeze. ABDOMEN: Soft positive bowel sounds NEURO: No gross focal neurological deficits other than her baseline blindness and baseline documented peripheral neuropathy, and decreased hearing. MSK: Movement of extremity x 4.  Using a walker to walk.  She still has antalgic gait. FOOT: Left.  Mild soft tissue swelling on the dorsum which is mildly tender over the ATF area.  Squeeze test is negative.  Full range of motion of the ankle.   PSYCH: AxOx4. Good eye contact.. No psychomotor retardation or agitation. Appropriate speech fluency and content. Asks and answers questions appropriately. Mood is congruent.     ASSESSMENT / PLAN:  1.  Foot pain: Left.  Improved.  I would let  her continue to wean out of the postop shoe.  I do like the fact that she is using her walker more.  I do not think a brace would be beneficial.  A compression stocking such as when she can find it at the drugstore would be beneficial and that it with decrease her swelling.  It may be another 4 to 6 weeks before the pain is totally gone.  I reviewed her x-ray results with her today #2.  Diabetes: We will get some blood work today as she is overdue.  Get some refills for her.  I will see her back in 3 months for follow-up of diabetes 3.  Stressful: Discussed her stressors.  She seems to be doing much better now that she is living independently again.

## 2019-01-15 NOTE — Patient Instructions (Signed)
Great to see you!   

## 2019-01-16 LAB — CMP14+EGFR
ALT: 20 IU/L (ref 0–32)
AST: 23 IU/L (ref 0–40)
Albumin/Globulin Ratio: 1.6 (ref 1.2–2.2)
Albumin: 4.2 g/dL (ref 3.8–4.8)
Alkaline Phosphatase: 130 IU/L — ABNORMAL HIGH (ref 39–117)
BUN/Creatinine Ratio: 19 (ref 12–28)
BUN: 16 mg/dL (ref 8–27)
Bilirubin Total: 0.2 mg/dL (ref 0.0–1.2)
CO2: 28 mmol/L (ref 20–29)
Calcium: 9.5 mg/dL (ref 8.7–10.3)
Chloride: 102 mmol/L (ref 96–106)
Creatinine, Ser: 0.84 mg/dL (ref 0.57–1.00)
GFR calc Af Amer: 84 mL/min/{1.73_m2} (ref >59)
GFR calc non Af Amer: 73 mL/min/{1.73_m2} (ref >59)
Globulin, Total: 2.7 g/dL (ref 1.5–4.5)
Glucose: 96 mg/dL (ref 65–99)
Potassium: 4 mmol/L (ref 3.5–5.2)
Sodium: 143 mmol/L (ref 134–144)
Total Protein: 6.9 g/dL (ref 6.0–8.5)

## 2019-01-18 ENCOUNTER — Encounter: Payer: Self-pay | Admitting: Family Medicine

## 2019-01-26 DIAGNOSIS — G4733 Obstructive sleep apnea (adult) (pediatric): Secondary | ICD-10-CM | POA: Diagnosis not present

## 2019-01-28 ENCOUNTER — Ambulatory Visit: Payer: Medicare Other | Admitting: Podiatry

## 2019-02-05 ENCOUNTER — Other Ambulatory Visit: Payer: Self-pay | Admitting: Allergy

## 2019-02-08 DIAGNOSIS — H53023 Refractive amblyopia, bilateral: Secondary | ICD-10-CM | POA: Diagnosis not present

## 2019-02-08 DIAGNOSIS — H1811 Bullous keratopathy, right eye: Secondary | ICD-10-CM | POA: Diagnosis not present

## 2019-02-08 DIAGNOSIS — E119 Type 2 diabetes mellitus without complications: Secondary | ICD-10-CM | POA: Diagnosis not present

## 2019-02-08 DIAGNOSIS — H401133 Primary open-angle glaucoma, bilateral, severe stage: Secondary | ICD-10-CM | POA: Diagnosis not present

## 2019-02-08 DIAGNOSIS — H04123 Dry eye syndrome of bilateral lacrimal glands: Secondary | ICD-10-CM | POA: Diagnosis not present

## 2019-02-08 LAB — HM DIABETES EYE EXAM

## 2019-02-10 ENCOUNTER — Ambulatory Visit (INDEPENDENT_AMBULATORY_CARE_PROVIDER_SITE_OTHER): Payer: Medicare Other | Admitting: Podiatry

## 2019-02-10 ENCOUNTER — Encounter: Payer: Self-pay | Admitting: Podiatry

## 2019-02-10 ENCOUNTER — Other Ambulatory Visit: Payer: Self-pay

## 2019-02-10 VITALS — Temp 98.2°F

## 2019-02-10 DIAGNOSIS — M79675 Pain in left toe(s): Secondary | ICD-10-CM

## 2019-02-10 DIAGNOSIS — L84 Corns and callosities: Secondary | ICD-10-CM | POA: Diagnosis not present

## 2019-02-10 DIAGNOSIS — E1142 Type 2 diabetes mellitus with diabetic polyneuropathy: Secondary | ICD-10-CM

## 2019-02-10 DIAGNOSIS — B351 Tinea unguium: Secondary | ICD-10-CM

## 2019-02-10 DIAGNOSIS — M79674 Pain in right toe(s): Secondary | ICD-10-CM | POA: Diagnosis not present

## 2019-02-10 NOTE — Patient Instructions (Signed)
Diabetes Mellitus and Foot Care  Foot care is an important part of your health, especially when you have diabetes. Diabetes may cause you to have problems because of poor blood flow (circulation) to your feet and legs, which can cause your skin to:   Become thinner and drier.   Break more easily.   Heal more slowly.   Peel and crack.  You may also have nerve damage (neuropathy) in your legs and feet, causing decreased feeling in them. This means that you may not notice minor injuries to your feet that could lead to more serious problems. Noticing and addressing any potential problems early is the best way to prevent future foot problems.  How to care for your feet  Foot hygiene   Wash your feet daily with warm water and mild soap. Do not use hot water. Then, pat your feet and the areas between your toes until they are completely dry. Do not soak your feet as this can dry your skin.   Trim your toenails straight across. Do not dig under them or around the cuticle. File the edges of your nails with an emery board or nail file.   Apply a moisturizing lotion or petroleum jelly to the skin on your feet and to dry, brittle toenails. Use lotion that does not contain alcohol and is unscented. Do not apply lotion between your toes.  Shoes and socks   Wear clean socks or stockings every day. Make sure they are not too tight. Do not wear knee-high stockings since they may decrease blood flow to your legs.   Wear shoes that fit properly and have enough cushioning. Always look in your shoes before you put them on to be sure there are no objects inside.   To break in new shoes, wear them for just a few hours a day. This prevents injuries on your feet.  Wounds, scrapes, corns, and calluses   Check your feet daily for blisters, cuts, bruises, sores, and redness. If you cannot see the bottom of your feet, use a mirror or ask someone for help.   Do not cut corns or calluses or try to remove them with medicine.   If you  find a minor scrape, cut, or break in the skin on your feet, keep it and the skin around it clean and dry. You may clean these areas with mild soap and water. Do not clean the area with peroxide, alcohol, or iodine.   If you have a wound, scrape, corn, or callus on your foot, look at it several times a day to make sure it is healing and not infected. Check for:  ? Redness, swelling, or pain.  ? Fluid or blood.  ? Warmth.  ? Pus or a bad smell.  General instructions   Do not cross your legs. This may decrease blood flow to your feet.   Do not use heating pads or hot water bottles on your feet. They may burn your skin. If you have lost feeling in your feet or legs, you may not know this is happening until it is too late.   Protect your feet from hot and cold by wearing shoes, such as at the beach or on hot pavement.   Schedule a complete foot exam at least once a year (annually) or more often if you have foot problems. If you have foot problems, report any cuts, sores, or bruises to your health care provider immediately.  Contact a health care provider if:     You have a medical condition that increases your risk of infection and you have any cuts, sores, or bruises on your feet.   You have an injury that is not healing.   You have redness on your legs or feet.   You feel burning or tingling in your legs or feet.   You have pain or cramps in your legs and feet.   Your legs or feet are numb.   Your feet always feel cold.   You have pain around a toenail.  Get help right away if:   You have a wound, scrape, corn, or callus on your foot and:  ? You have pain, swelling, or redness that gets worse.  ? You have fluid or blood coming from the wound, scrape, corn, or callus.  ? Your wound, scrape, corn, or callus feels warm to the touch.  ? You have pus or a bad smell coming from the wound, scrape, corn, or callus.  ? You have a fever.  ? You have a red line going up your leg.  Summary   Check your feet every day  for cuts, sores, red spots, swelling, and blisters.   Moisturize feet and legs daily.   Wear shoes that fit properly and have enough cushioning.   If you have foot problems, report any cuts, sores, or bruises to your health care provider immediately.   Schedule a complete foot exam at least once a year (annually) or more often if you have foot problems.  This information is not intended to replace advice given to you by your health care provider. Make sure you discuss any questions you have with your health care provider.  Document Released: 08/16/2000 Document Revised: 10/01/2017 Document Reviewed: 09/20/2016  Elsevier Interactive Patient Education  2019 [employer]

## 2019-02-16 ENCOUNTER — Other Ambulatory Visit: Payer: Self-pay

## 2019-02-16 ENCOUNTER — Ambulatory Visit: Payer: Medicare Other | Admitting: Orthotics

## 2019-02-16 DIAGNOSIS — M79674 Pain in right toe(s): Secondary | ICD-10-CM

## 2019-02-16 DIAGNOSIS — B351 Tinea unguium: Secondary | ICD-10-CM

## 2019-02-16 DIAGNOSIS — E0842 Diabetes mellitus due to underlying condition with diabetic polyneuropathy: Secondary | ICD-10-CM

## 2019-02-16 DIAGNOSIS — L84 Corns and callosities: Secondary | ICD-10-CM

## 2019-02-16 NOTE — Progress Notes (Signed)

## 2019-02-19 ENCOUNTER — Telehealth: Payer: Self-pay | Admitting: Internal Medicine

## 2019-02-19 DIAGNOSIS — G4733 Obstructive sleep apnea (adult) (pediatric): Secondary | ICD-10-CM

## 2019-02-19 NOTE — Telephone Encounter (Signed)
Returned call to patient and she would like an order sent to [facility] for a new cpap. Pt states machine runs well but print on cpap is wearing and she is visually impaired. Please advise.

## 2019-02-19 NOTE — Telephone Encounter (Signed)
Order- DME [facility]- please replace or service old CPAP machine, auto 5-15, mask of choice,humidiifier, supplies, AirView/ card. Patient can no longer read the machine.

## 2019-02-19 NOTE — Telephone Encounter (Signed)
Order placed.  Pt aware.  Nothing further needed.  

## 2019-02-25 DIAGNOSIS — G4733 Obstructive sleep apnea (adult) (pediatric): Secondary | ICD-10-CM | POA: Diagnosis not present

## 2019-02-25 NOTE — Progress Notes (Signed)
Subjective: [name] [name] presents today preventative diabetic foot care.  She presents with history of neuropathy.   Patient seen for follow up of chronic, painful mycotic toenails and callus(es) bilaterally which interfere with daily activities and routine tasks.  Pain is aggravated when wearing enclosed shoe gear. Pain is getting progressively worse and relieved with periodic professional debridement.   She will be measured for her diabetic shoes on next week.  Dickie La, MD is her PCP.   Current Outpatient Medications:  .  ACCU-CHEK FASTCLIX LANCETS MISC, TEST THREE TIMES DAILY, Disp: 306 each, Rfl: 12 .  acyclovir (ZOVIRAX) 200 MG capsule, Take 1 capsule (200 mg total) by mouth 2 (two) times daily., Disp: 180 capsule, Rfl: 3 .  Alcohol Swabs (B-D SINGLE USE SWABS REGULAR) PADS, USE THREE TIMES DAILY, Disp: 300 each, Rfl: 3 .  allopurinol (ZYLOPRIM) 300 MG tablet, TAKE ONE TABLET BY MOUTH DAILY, Disp: 90 tablet, Rfl: 3 .  ammonium lactate (LAC-HYDRIN) 12 % lotion, APPLY 1 APPLICATION TOPICALLY TWICE DAILY AS NEEDED FOR DRY SKIN (SUBSTITUTED FOR LAC HYDRIN), Disp: 400 g, Rfl: 12 .  aspirin EC 81 MG tablet, Take 81 mg by mouth daily., Disp: , Rfl:  .  atorvastatin (LIPITOR) 40 MG tablet, TAKE 1 TABLET EVERY DAY, Disp: 90 tablet, Rfl: 3 .  azelastine (ASTELIN) 0.1 % nasal spray, USE 2 SPRAYS IN EACH NOSTRIL EVERY DAY AS NEEDED AS DIRECTED, Disp: 60 mL, Rfl: 3 .  B-D ULTRAFINE III SHORT PEN 31G X 8 MM MISC, USE TO INJECT THREE TIMES DAILY, Disp: 270 each, Rfl: 3 .  bisacodyl (DULCOLAX) 10 MG suppository, Place 1 suppository (10 mg total) rectally as needed for moderate constipation., Disp: 12 suppository, Rfl: 0 .  budesonide-formoterol (SYMBICORT) 160-4.5 MCG/ACT inhaler, INHALE 2 PUFFS INTO THE LUNGS 2 (TWO) TIMES DAILY. RINSE, GARGLE, AND SPIT AFTER USE., Disp: 3 Inhaler, Rfl: 3 .  buPROPion (WELLBUTRIN) 100 MG tablet, Take 1 tablet (100 mg total) by mouth 2 (two) times daily.,  Disp: 180 tablet, Rfl: 1 .  COMBIGAN 0.2-0.5 % ophthalmic solution, INSTILL 1 DROP INTO THE  LEFT EYE EVERY 12 HOURS, Disp: 5 mL, Rfl: 12 .  diclofenac sodium (VOLTAREN) 1 % GEL, Apply 2 g topically 4 (four) times daily., Disp: 100 g, Rfl: 0 .  Dulaglutide (TRULICITY) 1.5 DX/8.3JA SOPN, INJECT  1.5MG   INTO  THE  SKIN EVERY WEEK, Disp: 12 mL, Rfl: 3 .  EPINEPHrine 0.3 mg/0.3 mL IJ SOAJ injection, Inject 0.3 mLs (0.3 mg total) into the muscle once as needed (for allergic reaction)., Disp: 1 Device, Rfl: 3 .  esomeprazole (NEXIUM) 40 MG capsule, TAKE 1 CAPSULE EVERY DAY, Disp: 90 capsule, Rfl: 3 .  ezetimibe (ZETIA) 10 MG tablet, Take 1 tablet (10 mg total) by mouth daily., Disp: 90 tablet, Rfl: 3 .  fluticasone (FLONASE) 50 MCG/ACT nasal spray, Place 2 sprays into both nostrils daily., Disp: 48 g, Rfl: 3 .  furosemide (LASIX) 40 MG tablet, Take one tablet by mouth daily, Disp: 90 tablet, Rfl: 3 .  glucose blood (PRODIGY NO CODING BLOOD GLUC) test strip, USE AS INSTRUCTED TO CHECK BLOOD SUGAR THREE TIMES A DAY, Disp: 300 each, Rfl: 12 .  Insulin Glargine (LANTUS SOLOSTAR) 100 UNIT/ML Solostar Pen, INJECT 35 UNITS INTO THE SKIN 2 (TWO) TIMES DAILY., Disp: 60 mL, Rfl: 3 .  insulin lispro (HUMALOG) 100 UNIT/ML KwikPen, Inject 0.35 mLs (35 Units total) into the skin 2 (two) times daily after a meal., Disp: 75  mL, Rfl: 3 .  Insulin Syringe-Needle U-100 (BD INSULIN SYRINGE ULTRAFINE) 31G X 15/64" 0.5 ML MISC, Use to inject three times a day, Disp: 300 each, Rfl: 2 .  latanoprost (XALATAN) 0.005 % ophthalmic solution, Place 1 drop into both eyes at bedtime., Disp: 2.5 mL, Rfl: 3 .  LINZESS 290 MCG CAPS capsule, Take 290 mcg by mouth daily as needed. , Disp: , Rfl:  .  losartan (COZAAR) 50 MG tablet, Take 2 tablets (100 mg total) by mouth daily., Disp: 90 tablet, Rfl: 3 .  metoCLOPramide (REGLAN) 5 MG tablet, Take 1 tablet (5 mg total) by mouth 4 (four) times daily., Disp: 360 tablet, Rfl: 3 .  metoprolol  succinate (TOPROL-XL) 50 MG 24 hr tablet, TAKE 3 TABLETS EVERY DAY WITH OR IMMEDIATELY FOLLOWING A MEAL, Disp: 270 tablet, Rfl: 3 .  montelukast (SINGULAIR) 10 MG tablet, Take 1 tablet (10 mg total) by mouth daily., Disp: 90 tablet, Rfl: 3 .  Multiple Vitamins-Minerals (MULTIVITAMIN PO), Take 1 tablet by mouth daily., Disp: , Rfl:  .  PROAIR HFA 108 (90 Base) MCG/ACT inhaler, USE 2 PUFFS BY MOUTH INTO  THE LUNGS EVERY 4 HOURS AS  NEEDED FOR WHEEZING OR  SHORTNESS OF BREATH, Disp: 51 g, Rfl: 0 .  RESTASIS MULTIDOSE 0.05 % ophthalmic emulsion, Place 1 drop into both eyes daily as needed., Disp: , Rfl: 3 .  sertraline (ZOLOFT) 50 MG tablet, Take 1 tablet (50 mg total) by mouth daily., Disp: 90 tablet, Rfl: 1 .  spironolactone (ALDACTONE) 25 MG tablet, TAKE 1 TABLET EVERY DAY BEFORE SUPPER, Disp: 90 tablet, Rfl: 3 .  traMADol (ULTRAM) 50 MG tablet, TAKE 1-2 TABLETS BY MOUTH EVERY 8 HOURS AS NEEDED FOR PAIN. MAX 6 TABLETS PER 24 HOURS, Disp: 180 tablet, Rfl: 5  Allergies  Allergen Reactions  . Bee Venom Hives and Swelling  . Propoxyphene Hcl Itching  . Amlodipine Besylate Swelling  . Hydrocodone Other (See Comments)    With Vicodin - makes patient "jittery", and "hangover effect - sleepy next day"  . Lisinopril Cough    Changed to ARB  . Metformin And Related Diarrhea    GI distress  . Metronidazole Other (See Comments)    REACTION: "just didn't work; my body never did heal from it"  . Valsartan Other (See Comments)    REACTION:  "sleep more the next day after I took it; it made me real tired"    Objective: Vitals:   02/10/19 1543  Temp: 98.2 F (36.8 C)   Physical examination essentially unchanged.  Vascular Examination: Capillary refill time immediate x 10 digits.  Dorsalis pedis pulses present b/l.  Posterior tibial pulses present b/l.  No digital hair x 10 digits.  Skin temperature WNL b/l.  Dermatological Examination: Skin with normal turgor, texture and tone  b/l.  Toenails 1-5 b/l discolored, thick, dystrophic with subungual debris and pain with palpation to nailbeds due to thickness of nails.  Hyperkeratotic lesion(s) noted submetatarsal heads 3, 4 left foot and submetatarsal head 1 right foot. No erythema, no edema, no drainage, no flocculence noted.   Musculoskeletal: Muscle strength 5/5 to all LE muscle groups.  Hammertoes noted bilaterally  Pes planus foot deformity bilaterally  Neurological: Sensation intact with 10 gram monofilament.  Vibratory sensation intact Assessment: 1. Painful onychomycosis toenails 1-5 b/l 2.   Calluses submetatarsal heads 3, 4 left foot and submetatarsal head 1 right foot 3.  NIDDM with neuropathy  Plan: 1. Toenails 1-5 b/l were debrided in  length and girth without iatrogenic bleeding. 2. Calluses pared submetatarsal heads 3, 4 left foot and submetatarsal head 1 right foot utilizing sterile scalpel blade without incident.  3. She will be measured for her new diabetic shoes on next week.  Patient to continue soft, supportive shoe gear daily. 4. Patient to report any pedal injuries to medical professional immediately. 5. Follow up 3 months.  6. Patient/POA to call should there be a concern in the interim.

## 2019-03-10 ENCOUNTER — Other Ambulatory Visit: Payer: Self-pay

## 2019-03-10 ENCOUNTER — Ambulatory Visit (INDEPENDENT_AMBULATORY_CARE_PROVIDER_SITE_OTHER): Payer: Medicare Other | Admitting: Allergy

## 2019-03-10 ENCOUNTER — Encounter: Payer: Self-pay | Admitting: Allergy

## 2019-03-10 VITALS — BP 114/72 | HR 74 | Temp 97.4°F | Resp 18

## 2019-03-10 DIAGNOSIS — J302 Other seasonal allergic rhinitis: Secondary | ICD-10-CM

## 2019-03-10 DIAGNOSIS — J3089 Other allergic rhinitis: Secondary | ICD-10-CM | POA: Diagnosis not present

## 2019-03-10 DIAGNOSIS — Z91030 Bee allergy status: Secondary | ICD-10-CM | POA: Diagnosis not present

## 2019-03-10 DIAGNOSIS — J4540 Moderate persistent asthma, uncomplicated: Secondary | ICD-10-CM | POA: Diagnosis not present

## 2019-03-10 DIAGNOSIS — J4520 Mild intermittent asthma, uncomplicated: Secondary | ICD-10-CM | POA: Diagnosis not present

## 2019-03-10 DIAGNOSIS — K219 Gastro-esophageal reflux disease without esophagitis: Secondary | ICD-10-CM

## 2019-03-10 DIAGNOSIS — R21 Rash and other nonspecific skin eruption: Secondary | ICD-10-CM | POA: Diagnosis not present

## 2019-03-10 MED ORDER — LEVOCETIRIZINE DIHYDROCHLORIDE 5 MG PO TABS: 5.0000 mg | ORAL_TABLET | Freq: Every evening | ORAL | 2 refills | Status: DC

## 2019-03-10 MED ORDER — FAMOTIDINE 40 MG PO TABS: 40.0000 mg | ORAL_TABLET | Freq: Every day | ORAL | 2 refills | Status: DC

## 2019-03-10 NOTE — Progress Notes (Signed)
Follow Up Note  RE: [name] [name] MRN: [id number] DOB: 09-27-1950 Date of Office Visit: 03/10/2019  Referring provider: Dickie La, MD Primary care provider: Dickie La, MD  Chief Complaint: Follow-up  History of Present Illness: I had the pleasure of seeing [name] [name] for a follow up visit at the [facility] on 03/10/2019. She is a 68 y.o. female, who is being followed for asthma, allergic rhinitis, LPRD and hymenoptera allergy. Today she is here for regular follow up visit. Her previous allergy office visit was on 09/09/2018 with Dr. Maudie Mercury.   Asthma: ACT score 16 today. Having issues with SOB with exertion. Using albuterol about 1-2 times a week with good benefit.  Some coughing at times as well but denies any wheezing or chest tightness.   Using Symbicort 160 2 puffs twice a day without spacer and sometimes rinsing her mouth.  Still taking Singulair daily.  Patient fell with a cane in March and sprained her ankle. Now using her walker more often.  Leg is feeling better.  Seasonal and perennial allergic rhinitis Has been having issues with breaking out in hives on the arms at night only.  Denies any changes in personal care products diet or medications. CPAP tends to dry out her nasal passages. Using Flonase 2 sprays twice a day with no epistaxis.   LPRD (laryngopharyngeal reflux disease) Symptoms manageable. Stopped zantac due to recall and noticed some worsening symptoms. Still taking Nexium 40mg  twice a day.  Bee sting allergy No stings since the last visit.  Assessment and Plan: [name] [name] is a 68 y.o. female with: Moderate persistent asthma without complication Today's ACT score was only 16 due to dyspnea on exertion. Using albuterol for these episodes 1-2 times a week with good benefit.  Today's spirometry was normal.  Daily controller medication(s):continue Symbicort 160 2 puffs twice a day with spacer and rinse mouth afterwards.   New spacer given and advised patient to use her inhalers with the spacer.  Continue Singulair 10mg  daily   Prior to physical activity:May use albuterol rescue inhaler 2 puffs 5 to 15 minutes prior to strenuous physical activities.  Rescue medications:May use albuterol rescue inhaler 2 puffs or nebulizer every 4 to 6 hours as needed for shortness of breath, chest tightness, coughing, and wheezing. Monitor frequency of use.   Seasonal and perennial allergic rhinitis Past history - 2018 skin testing was positive to dust mites and tree pollen. Interim history - nasal symptoms have improved.   Continue environmental control measures.  Use Flonase 1 spray twice a day.  Nasal saline spray (i.e., Simply Saline) or nasal saline lavage (i.e., NeilMed) is recommended as needed and prior to medicated nasal sprays.  LPRD (laryngopharyngeal reflux disease) Symptoms slightly worse off zantac.  Continue nexium 40mg  twice a day.   Start pepcid 40mg  daily. This replaces the zantac.  Bee sting allergy No stings since last visit.   For mild symptoms you can take over the counter antihistamines such as Benadryl and monitor symptoms closely. If symptoms worsen or if you have severe symptoms including breathing issues, throat closure, significant swelling, whole body hives, severe diarrhea and vomiting, lightheadedness then inject epinephrine and seek immediate medical care afterwards.  Consider testing in future.  Rash and nonspecific skin eruption Breaking out in rash/hives at night. No triggers noted.  Take xyzal 5mg  daily at night and see if helps.   Return in about 6 months (around 09/10/2019).  Meds ordered this encounter  Medications  . famotidine (PEPCID) 40 MG tablet    Sig: Take 1 tablet (40 mg total) by mouth daily.    Dispense:  90 tablet    Refill:  2  . levocetirizine (XYZAL) 5 MG tablet    Sig: Take 1 tablet (5 mg total) by mouth every evening. As needed for the hives/rash.     Dispense:  90 tablet    Refill:  2   Diagnostics: Spirometry:  Tracings reviewed. Her effort: It was hard to get consistent efforts and there is a question as to whether this reflects a maximal maneuver. FVC: 1.73L FEV1: 1.48L, 94% predicted FEV1/FVC ratio: 86% Interpretation: Spirometry consistent with normal pattern.  Please see scanned spirometry results for details.  Medication List:  Current Outpatient Medications  Medication Sig Dispense Refill  . ACCU-CHEK FASTCLIX LANCETS MISC TEST THREE TIMES DAILY 306 each 12  . acyclovir (ZOVIRAX) 200 MG capsule Take 1 capsule (200 mg total) by mouth 2 (two) times daily. 180 capsule 3  . Alcohol Swabs (B-D SINGLE USE SWABS REGULAR) PADS USE THREE TIMES DAILY 300 each 3  . allopurinol (ZYLOPRIM) 300 MG tablet TAKE ONE TABLET BY MOUTH DAILY 90 tablet 3  . ammonium lactate (LAC-HYDRIN) 12 % lotion APPLY 1 APPLICATION TOPICALLY TWICE DAILY AS NEEDED FOR DRY SKIN (SUBSTITUTED FOR LAC HYDRIN) 400 g 12  . aspirin EC 81 MG tablet Take 81 mg by mouth daily.    [name] atorvastatin (LIPITOR) 40 MG tablet TAKE 1 TABLET EVERY DAY 90 tablet 3  . azelastine (ASTELIN) 0.1 % nasal spray USE 2 SPRAYS IN EACH NOSTRIL EVERY DAY AS NEEDED AS DIRECTED 60 mL 3  . B-D ULTRAFINE III SHORT PEN 31G X 8 MM MISC USE TO INJECT THREE TIMES DAILY 270 each 3  . bisacodyl (DULCOLAX) 10 MG suppository Place 1 suppository (10 mg total) rectally as needed for moderate constipation. 12 suppository 0  . budesonide-formoterol (SYMBICORT) 160-4.5 MCG/ACT inhaler INHALE 2 PUFFS INTO THE LUNGS 2 (TWO) TIMES DAILY. RINSE, GARGLE, AND SPIT AFTER USE. 3 Inhaler 3  . buPROPion (WELLBUTRIN) 100 MG tablet Take 1 tablet (100 mg total) by mouth 2 (two) times daily. 180 tablet 1  . COMBIGAN 0.2-0.5 % ophthalmic solution INSTILL 1 DROP INTO THE  LEFT EYE EVERY 12 HOURS 5 mL 12  . Dulaglutide (TRULICITY) 1.5 RA/0.7MA SOPN INJECT  1.5MG   INTO  THE  SKIN EVERY WEEK 12 mL 3  . EPINEPHrine 0.3 mg/0.3 mL  IJ SOAJ injection Inject 0.3 mLs (0.3 mg total) into the muscle once as needed (for allergic reaction). 1 Device 3  . esomeprazole (NEXIUM) 40 MG capsule TAKE 1 CAPSULE EVERY DAY 90 capsule 3  . ezetimibe (ZETIA) 10 MG tablet Take 1 tablet (10 mg total) by mouth daily. 90 tablet 3  . fluticasone (FLONASE) 50 MCG/ACT nasal spray Place 2 sprays into both nostrils daily. 48 g 3  . furosemide (LASIX) 40 MG tablet Take one tablet by mouth daily 90 tablet 3  . glucose blood (PRODIGY NO CODING BLOOD GLUC) test strip USE AS INSTRUCTED TO CHECK BLOOD SUGAR THREE TIMES A DAY 300 each 12  . Insulin Glargine (LANTUS SOLOSTAR) 100 UNIT/ML Solostar Pen INJECT 35 UNITS INTO THE SKIN 2 (TWO) TIMES DAILY. 60 mL 3  . insulin lispro (HUMALOG) 100 UNIT/ML KwikPen Inject 0.35 mLs (35 Units total) into the skin 2 (two) times daily after a meal. 75 mL 3  . Insulin Syringe-Needle U-100 (BD INSULIN SYRINGE ULTRAFINE) 31G X  15/64" 0.5 ML MISC Use to inject three times a day 300 each 2  . latanoprost (XALATAN) 0.005 % ophthalmic solution Place 1 drop into both eyes at bedtime. 2.5 mL 3  . LINZESS 290 MCG CAPS capsule Take 290 mcg by mouth daily as needed.     [name] losartan (COZAAR) 50 MG tablet Take 2 tablets (100 mg total) by mouth daily. 90 tablet 3  . metoCLOPramide (REGLAN) 5 MG tablet Take 1 tablet (5 mg total) by mouth 4 (four) times daily. 360 tablet 3  . metoprolol succinate (TOPROL-XL) 50 MG 24 hr tablet TAKE 3 TABLETS EVERY DAY WITH OR IMMEDIATELY FOLLOWING A MEAL 270 tablet 3  . montelukast (SINGULAIR) 10 MG tablet Take 1 tablet (10 mg total) by mouth daily. 90 tablet 3  . Multiple Vitamins-Minerals (MULTIVITAMIN PO) Take 1 tablet by mouth daily.    [name] PROAIR HFA 108 (90 Base) MCG/ACT inhaler USE 2 PUFFS BY MOUTH INTO  THE LUNGS EVERY 4 HOURS AS  NEEDED FOR WHEEZING OR  SHORTNESS OF BREATH 51 g 0  . RESTASIS MULTIDOSE 0.05 % ophthalmic emulsion Place 1 drop into both eyes daily as needed.  3  . sertraline (ZOLOFT) 50  MG tablet Take 1 tablet (50 mg total) by mouth daily. 90 tablet 1  . spironolactone (ALDACTONE) 25 MG tablet TAKE 1 TABLET EVERY DAY BEFORE SUPPER 90 tablet 3  . traMADol (ULTRAM) 50 MG tablet TAKE 1-2 TABLETS BY MOUTH EVERY 8 HOURS AS NEEDED FOR PAIN. MAX 6 TABLETS PER 24 HOURS 180 tablet 5  . diclofenac sodium (VOLTAREN) 1 % GEL Apply 2 g topically 4 (four) times daily. (Patient not taking: Reported on 03/10/2019) 100 g 0  . famotidine (PEPCID) 40 MG tablet Take 1 tablet (40 mg total) by mouth daily. 90 tablet 2  . levocetirizine (XYZAL) 5 MG tablet Take 1 tablet (5 mg total) by mouth every evening. As needed for the hives/rash. 90 tablet 2   No current facility-administered medications for this visit.    Allergies: Allergies  Allergen Reactions  . Bee Venom Hives and Swelling  . Propoxyphene Hcl Itching  . Amlodipine Besylate Swelling  . Hydrocodone Other (See Comments)    With Vicodin - makes patient "jittery", and "hangover effect - sleepy next day"  . Lisinopril Cough    Changed to ARB  . Metformin And Related Diarrhea    GI distress  . Metronidazole Other (See Comments)    REACTION: "just didn't work; my body never did heal from it"  . Valsartan Other (See Comments)    REACTION:  "sleep more the next day after I took it; it made me real tired"   I reviewed her past medical history, social history, family history, and environmental history and no significant changes have been reported from previous visit on 09/09/2018.  Review of Systems  Constitutional: Negative for appetite change, chills, fever and unexpected weight change.  HENT: Positive for congestion. Negative for rhinorrhea.   Eyes: Negative for itching.  Respiratory: Negative for cough, chest tightness, shortness of breath and wheezing.   Gastrointestinal: Negative for abdominal pain.  Skin: Positive for rash.  Allergic/Immunologic: Positive for environmental allergies. Negative for food allergies.  Neurological:  Negative for headaches.   Objective: BP 114/72 (BP Location: Left Arm, Patient Position: Sitting, Cuff Size: Normal)   Pulse 74   Temp (!) 97.4 F (36.3 C) (Temporal)   Resp 18   SpO2 96%  There is no height or weight on file  to calculate BMI. Physical Exam  Constitutional: She is oriented to person, place, and time. She appears well-developed and well-nourished.  HENT:  Head: Normocephalic and atraumatic.  Right Ear: External ear normal.  Left Ear: External ear normal.  Nose: Nose normal.  Mouth/Throat: Oropharynx is clear and moist.  Eyes: Conjunctivae and EOM are normal.  Neck: Neck supple.  Cardiovascular: Normal rate, regular rhythm and normal heart sounds. Exam reveals no gallop and no friction rub.  No murmur heard. Pulmonary/Chest: Effort normal and breath sounds normal. She has no wheezes. She has no rales.  Neurological: She is alert and oriented to person, place, and time.  Skin: Skin is warm. No rash noted.  Psychiatric: She has a normal mood and affect. Her behavior is normal.  Nursing note and vitals reviewed.  Previous notes and tests were reviewed. The plan was reviewed with the patient/family, and all questions/concerned were addressed.  It was my pleasure to see [name] [name] today and participate in her care. Please feel free to contact me with any questions or concerns.  Sincerely,  Rexene Alberts, DO Allergy & Immunology  [facility] office: [phone] [facility] office: [phone]

## 2019-03-10 NOTE — Assessment & Plan Note (Signed)
Breaking out in rash/hives at night. No triggers noted.  Take xyzal 5mg  daily at night and see if helps.

## 2019-03-10 NOTE — Assessment & Plan Note (Signed)
Today's ACT score was only 16 due to dyspnea on exertion. Using albuterol for these episodes 1-2 times a week with good benefit.  Today's spirometry was normal.  Daily controller medication(s):continue Symbicort 160 2 puffs twice a day with spacer and rinse mouth afterwards.  New spacer given and advised patient to use her inhalers with the spacer.  Continue Singulair 10mg  daily   Prior to physical activity:May use albuterol rescue inhaler 2 puffs 5 to 15 minutes prior to strenuous physical activities.  Rescue medications:May use albuterol rescue inhaler 2 puffs or nebulizer every 4 to 6 hours as needed for shortness of breath, chest tightness, coughing, and wheezing. Monitor frequency of use.

## 2019-03-10 NOTE — Assessment & Plan Note (Signed)
No stings since last visit.  °· For mild symptoms you can take over the counter antihistamines such as Benadryl and monitor symptoms closely. If symptoms worsen or if you have severe symptoms including breathing issues, throat closure, significant swelling, whole body hives, severe diarrhea and vomiting, lightheadedness then inject epinephrine and seek immediate medical care afterwards. °· Consider testing in future. °

## 2019-03-10 NOTE — Patient Instructions (Addendum)
Asthma:   Daily controller medication(s):continue Symbicort 160 2 puffs twice a day with spacer and rinse mouth afterwards  Continue Singulair 10mg  daily   Prior to physical activity:May use albuterol rescue inhaler 2 puffs 5 to 15 minutes prior to strenuous physical activities.  Rescue medications:May use albuterol rescue inhaler 2 puffs or nebulizer every 4 to 6 hours as needed for shortness of breath, chest tightness, coughing, and wheezing. Monitor frequency of use.  Asthma control goals:  Full participation in all desired activities (may need albuterol before activity) Albuterol use two times or less a week on average (not counting use with activity) Cough interfering with sleep two times or less a month Oral steroids no more than once a year No hospitalizations  Seasonal and perennial allergic rhinitis Past history - 2018 skin testing was positive to dust mites and tree pollen.  Continue environmental control measures.  Use Flonase 1 spray twice a day.  Nasal saline spray (i.e., Simply Saline) or nasal saline lavage (i.e., NeilMed) is recommended as needed and prior to medicated nasal sprays.  Rash/hives  Take xyzal 5mg  daily at night and see if helps with your rash/hives. If it makes you too drowsy let us know.   LPRD (laryngopharyngeal reflux disease)  Continue nexium 40mg  twice a day.   Start pepcid 40mg  daily. This replaces the zantac.  Bee sting allergy  Avoid bee stings.  For mild symptoms you can take over the counter antihistamines such as Benadryl and monitor symptoms closely. If symptoms worsen or if you have severe symptoms including breathing issues, throat closure, significant swelling, whole body hives, severe diarrhea and vomiting, lightheadedness then inject epinephrine and seek immediate medical care afterwards.  Consider testing in future.  Follow up in 6 months  Control of House Dust Mite Allergen . Dust mite allergens are a common trigger  of allergy and asthma symptoms. While they can be found throughout the house, these microscopic creatures thrive in warm, humid environments such as bedding, upholstered furniture and carpeting. . Because so much time is spent in the bedroom, it is essential to reduce mite levels there.  . Encase pillows, mattresses, and box springs in special allergen-proof fabric covers or airtight, zippered plastic covers.  . Bedding should be washed weekly in hot water (130 F) and dried in a hot dryer. Allergen-proof covers are available for comforters and pillows that can't be regularly washed.  Wendee Copp the allergy-proof covers every few months. Minimize clutter in the bedroom. Keep pets out of the bedroom.  [name] Keep humidity less than 50% by using a dehumidifier or air conditioning. You can buy a humidity measuring device called a hygrometer to monitor this.  . If possible, replace carpets with hardwood, linoleum, or washable area rugs. If that's not possible, vacuum frequently with a vacuum that has a HEPA filter. . Remove all upholstered furniture and non-washable window drapes from the bedroom. . Remove all non-washable stuffed toys from the bedroom.  Wash stuffed toys weekly. Reducing Pollen Exposure . Pollen seasons: trees (spring), grass (summer) and ragweed/weeds (fall). [name] Keep windows closed in your home and car to lower pollen exposure.  Susa Simmonds air conditioning in the bedroom and throughout the house if possible.  . Avoid going out in dry windy days - especially early morning. . Pollen counts are highest between 5 - 10 AM and on dry, hot and windy days.  . Save outside activities for late afternoon or after a heavy rain, when pollen levels are lower.  [name]  Avoid mowing of grass if you have grass pollen allergy. [name] Be aware that pollen can also be transported indoors on people and pets.  . Dry your clothes in an automatic dryer rather than hanging them outside where they might collect pollen.  . Rinse  hair and eyes before bedtime.

## 2019-03-10 NOTE — Assessment & Plan Note (Signed)
Symptoms slightly worse off zantac.  Continue nexium 40mg  twice a day.   Start pepcid 40mg  daily. This replaces the zantac.

## 2019-03-10 NOTE — Assessment & Plan Note (Signed)
Past history - 2018 skin testing was positive to dust mites and tree pollen. Interim history - nasal symptoms have improved.   Continue environmental control measures.  Use Flonase 1 spray twice a day.  Nasal saline spray (i.e., Simply Saline) or nasal saline lavage (i.e., NeilMed) is recommended as needed and prior to medicated nasal sprays.

## 2019-03-11 ENCOUNTER — Encounter: Payer: Self-pay | Admitting: Family Medicine

## 2019-03-26 ENCOUNTER — Other Ambulatory Visit (HOSPITAL_COMMUNITY): Payer: Self-pay | Admitting: Psychiatry

## 2019-03-26 ENCOUNTER — Other Ambulatory Visit: Payer: Self-pay | Admitting: Family Medicine

## 2019-03-26 DIAGNOSIS — E1149 Type 2 diabetes mellitus with other diabetic neurological complication: Secondary | ICD-10-CM

## 2019-03-26 DIAGNOSIS — F330 Major depressive disorder, recurrent, mild: Secondary | ICD-10-CM

## 2019-03-26 DIAGNOSIS — F411 Generalized anxiety disorder: Secondary | ICD-10-CM

## 2019-03-27 DIAGNOSIS — G4733 Obstructive sleep apnea (adult) (pediatric): Secondary | ICD-10-CM | POA: Diagnosis not present

## 2019-04-01 ENCOUNTER — Encounter (HOSPITAL_COMMUNITY): Payer: Self-pay | Admitting: Psychiatry

## 2019-04-01 ENCOUNTER — Ambulatory Visit (INDEPENDENT_AMBULATORY_CARE_PROVIDER_SITE_OTHER): Payer: Medicare Other | Admitting: Psychiatry

## 2019-04-01 ENCOUNTER — Other Ambulatory Visit: Payer: Self-pay

## 2019-04-01 DIAGNOSIS — F330 Major depressive disorder, recurrent, mild: Secondary | ICD-10-CM

## 2019-04-01 DIAGNOSIS — F411 Generalized anxiety disorder: Secondary | ICD-10-CM

## 2019-04-01 MED ORDER — SERTRALINE HCL 100 MG PO TABS: 100.0000 mg | ORAL_TABLET | Freq: Every day | ORAL | 0 refills | Status: DC

## 2019-04-01 MED ORDER — BUPROPION HCL 100 MG PO TABS: 100.0000 mg | ORAL_TABLET | Freq: Two times a day (BID) | ORAL | 0 refills | Status: DC

## 2019-04-01 NOTE — Progress Notes (Signed)
Virtual Visit via Telephone Note  I connected with [name] [name] on 04/01/19 at 11:00 AM EDT by telephone and verified that I am speaking with the correct person using two identifiers.  Location: Patient: home Provider: office   I discussed the limitations, risks, security and privacy concerns of performing an evaluation and management service by telephone and the availability of in person appointments. I also discussed with the patient that there may be a patient responsible charge related to this service. The patient expressed understanding and agreed to proceed.   History of Present Illness: Pt fell on March 13 and sprang her ankle. She has been home since then. She rarely goes out for errands and doctor visits. Pt's brother had a stroke in Feb 26, 2023. He got worse and then died. She did not get to visit him in person before he passed. It was very hard on her. This was the only brother who was left and she was somewhat close to him. She grieved for 3-4 days which is unlike her. As time has passed she finds she has bad days where she does nothing. She is sitting around and napping about 3 days a week. It is improving slowly. part of it is getting old and having nothing to do. on some bad days her depression is more prominent than anxious or vice versa. Pt denies crying spells or isolation. She is more feeling lonely. She now has only her 2 sisters left. 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 She doesn't like being home for days on end.    Observations/Objective: I spoke with [name] [name] on the phone.  Pt was calm, pleasant and cooperative.  Pt was engaged in the conversation and answered questions appropriately.  Speech was clear and coherent with normal rate, tone and volume.  Mood is depressed and anxious, affect is congruent. Thought processes are coherent, goal oriented and intact.  Thought content is logical.  Pt denies SI/HI.   Pt denies auditory and visual  hallucinations and did not appear to be responding to internal stimuli.  Memory and concentration are good.  Fund of knowledge and use of language are average.  Insight and judgment are fair.  I am unable to comment on psychomotor activity, general appearance, hygiene, or eye contact as I was unable to physically see the patient on the phone.  Vital signs not available since interview conducted virtually.    Assessment and Plan: MDD-recurrent,mild; GAD  Wellbutrin 100mg  po BID for MDD Increase Zoloft 100mg  po qAM for MDD and GAD   Follow Up Instructions:  in 2 mo or sooner if needed  I discussed the assessment and treatment plan with the patient. The patient was provided an opportunity to ask questions and all were answered. The patient agreed with the plan and demonstrated an understanding of the instructions.   The patient was advised to call back or seek an in-person evaluation if the symptoms worsen or if the condition fails to improve as anticipated.  I provided 15 minutes of non-face-to-face time during this encounter.   Charlcie Cradle, MD

## 2019-04-13 ENCOUNTER — Other Ambulatory Visit: Payer: Self-pay

## 2019-04-13 ENCOUNTER — Ambulatory Visit (INDEPENDENT_AMBULATORY_CARE_PROVIDER_SITE_OTHER): Payer: Medicare Other | Admitting: Orthotics

## 2019-04-13 DIAGNOSIS — E1140 Type 2 diabetes mellitus with diabetic neuropathy, unspecified: Secondary | ICD-10-CM | POA: Diagnosis not present

## 2019-04-13 DIAGNOSIS — L84 Corns and callosities: Secondary | ICD-10-CM

## 2019-04-13 DIAGNOSIS — T148XXA Other injury of unspecified body region, initial encounter: Secondary | ICD-10-CM

## 2019-04-13 DIAGNOSIS — E0842 Diabetes mellitus due to underlying condition with diabetic polyneuropathy: Secondary | ICD-10-CM

## 2019-04-13 DIAGNOSIS — L989 Disorder of the skin and subcutaneous tissue, unspecified: Secondary | ICD-10-CM

## 2019-04-13 DIAGNOSIS — B351 Tinea unguium: Secondary | ICD-10-CM

## 2019-04-13 NOTE — Progress Notes (Signed)

## 2019-04-17 ENCOUNTER — Other Ambulatory Visit: Payer: Self-pay | Admitting: Allergy

## 2019-04-27 DIAGNOSIS — G4733 Obstructive sleep apnea (adult) (pediatric): Secondary | ICD-10-CM | POA: Diagnosis not present

## 2019-04-28 ENCOUNTER — Other Ambulatory Visit: Payer: Self-pay

## 2019-04-28 ENCOUNTER — Ambulatory Visit (INDEPENDENT_AMBULATORY_CARE_PROVIDER_SITE_OTHER): Payer: Medicare Other | Admitting: Family Medicine

## 2019-04-28 ENCOUNTER — Encounter: Payer: Self-pay | Admitting: Family Medicine

## 2019-04-28 DIAGNOSIS — G8929 Other chronic pain: Secondary | ICD-10-CM | POA: Diagnosis not present

## 2019-04-28 DIAGNOSIS — E1149 Type 2 diabetes mellitus with other diabetic neurological complication: Secondary | ICD-10-CM

## 2019-04-28 DIAGNOSIS — F411 Generalized anxiety disorder: Secondary | ICD-10-CM

## 2019-04-28 DIAGNOSIS — R109 Unspecified abdominal pain: Secondary | ICD-10-CM | POA: Diagnosis not present

## 2019-04-28 NOTE — Patient Instructions (Signed)
Shingles  Shingles is an infection. It gives you a painful skin rash and blisters that have fluid in them. Shingles is caused by the same germ (virus) that causes chickenpox. Shingles only happens in people who:  Have had chickenpox.   The first symptoms of shingles may be itching, tingling, or pain in an area on your skin. A rash will show on your skin a few days or weeks later. The rash is likely to be on one side of your body. The rash usually has a shape like a belt or a band. Over time, the rash turns into fluid-filled blisters. The blisters will break open, change into scabs, and dry up. Medicines may:  Help with pain and itching.  Help you get better sooner.  Help to prevent long-term problems. Follow these instructions at home: Medicines  Take over-the-counter and prescription medicines only as told by your doctor.  Put on an anti-itch cream or numbing cream where you have a rash, blisters, or scabs. Do this as told by your doctor. Helping with itching and discomfort   Put cold, wet cloths (cold compresses) on the area of the rash or blisters as told by your doctor.  Cool baths can help you feel better. Try adding baking soda or dry oatmeal to the water to lessen itching. Do not bathe in hot water. Blister and rash care  Keep your rash covered with a loose bandage (dressing).  Wear loose clothing that does not rub on your rash.  Keep your rash and blisters clean. To do this, wash the area with mild soap and cool water as told by your doctor.  Check your rash every day for signs of infection. Check for: ? More redness, swelling, or pain. ? Fluid or blood. ? Warmth. ? Pus or a bad smell.  Do not scratch your rash. Do not pick at your blisters. To help you to not scratch: ? Keep your fingernails clean and cut short. ? Wear gloves or mittens when you sleep, if scratching is a problem. General instructions  Rest as told by your doctor.  Keep all follow-up visits as  told by your doctor. This is important.  Wash your hands often with soap and water. If soap and water are not available, use hand sanitizer. Doing this lowers your chance of getting a skin infection caused by germs (bacteria).  Your infection can cause chickenpox in people who have never had chickenpox or never got a shot of chickenpox vaccine. If you have blisters that did not change into scabs yet, try not to touch other people or be around other people, especially: ? Babies. ? Pregnant women. ? Children who have areas of red, itchy, or rough skin (eczema). ? Very old people who have transplants. ? People who have a long-term (chronic) sickness, like cancer or AIDS. Contact a doctor if:  Your pain does not get better with medicine.  Your pain does not get better after the rash heals.  You have any signs of infection in the rash area. These signs include: ? More redness, swelling, or pain around the rash. ? Fluid or blood coming from the rash. ? The rash area feeling warm to the touch. ? Pus or a bad smell coming from the rash. Get help right away if:  The rash is on your face or nose.  You have pain in your face or pain by your eye.  You lose feeling on one side of your face.  You have trouble seeing.  You have ear pain, or you have ringing in your ear.  You have a loss of taste.  Your condition gets worse. Summary  Shingles gives you a painful skin rash and blisters that have fluid in them.  Shingles is an infection. It is caused by the same germ (virus) that causes chickenpox.  Keep your rash covered with a loose bandage (dressing). Wear loose clothing that does not rub on your rash.  If you have blisters that did not change into scabs yet, try not to touch other people or be around people. This information is not intended to replace advice given to you by your health care provider. Make sure you discuss any questions you have with your health care provider. Document  Released: 02/05/2008 Document Revised: 12/11/2018 Document Reviewed: 04/23/2017 Elsevier Patient Education  2020 [employer].

## 2019-04-29 ENCOUNTER — Encounter: Payer: Self-pay | Admitting: Family Medicine

## 2019-04-29 NOTE — Progress Notes (Signed)
    CHIEF COMPLAINT / HPI: #1.  Continued problem with left flank pain.  Intermittent.  She has not been taking her MiraLAX daily.  She takes it 2 or 3 times a week.  Feels like if she takes it daily then she will start having diarrhea. 2.  Depression: She says she has been stuck in the house since March.  She is quite unhappy with this.  Has questions about when it safe to go out and what activity she can do 3.  Has questions about shingles shot.  Wants to know what it does not does not do for her and what the risks are  REVIEW OF SYSTEMS: No unusual weight change.  She has had some depression but no suicidal or homicidal ideation.  Appetite is unchanged.  Left flank pain as above.  Bowel movements still somewhat constipated as per HPI.  No blood in her stool, no dark tarry stools.  PERTINENT  PMH / PSH: I have reviewed the patient's medications, allergies, past medical and surgical history, smoking status and updated in the EMR as appropriate.   OBJECTIVE:  Vital signs reviewed. GENERAL: Well-developed, well-nourished, no acute distress. CARDIOVASCULAR: Regular rate and rhythm no murmur gallop or rub LUNGS: Clear to auscultation bilaterally, no rales or wheeze. ABDOMEN: Soft positive bowel sounds NEURO: Legally blind.  Rises without assistance.  Gait is assisted by rolling walker. [name] MSK: Movement of extremity x 4.    ASSESSMENT / PLAN:   Diabetes mellitus type 2 with neurological manifestations ([place]) Her diabetes has been well controlled so I do not feel like we need an A1c today.  We will check it next time I see her.  Continue current medicines.  Chronic flank pain I think this is her biggest concern right now.  We reviewed that it is most likely related to constipation  Generalized anxiety disorder She continues to follow-up with psychiatry and is doing well on her meds.  I think it is time for her to get out and get a little more interactive socially.  We discussed ways to do  that safely.

## 2019-04-29 NOTE — Assessment & Plan Note (Signed)
She continues to follow-up with psychiatry and is doing well on her meds.  I think it is time for her to get out and get a little more interactive socially.  We discussed ways to do that safely.

## 2019-04-29 NOTE — Assessment & Plan Note (Signed)
Her diabetes has been well controlled so I do not feel like we need an A1c today.  We will check it next time I see her.  Continue current medicines.

## 2019-04-29 NOTE — Assessment & Plan Note (Signed)
I think this is her biggest concern right now.  We reviewed that it is most likely related to constipation

## 2019-05-18 ENCOUNTER — Other Ambulatory Visit: Payer: Self-pay

## 2019-05-18 ENCOUNTER — Encounter: Payer: Self-pay | Admitting: Podiatry

## 2019-05-18 ENCOUNTER — Ambulatory Visit (INDEPENDENT_AMBULATORY_CARE_PROVIDER_SITE_OTHER): Payer: Medicare Other | Admitting: Podiatry

## 2019-05-18 DIAGNOSIS — M79674 Pain in right toe(s): Secondary | ICD-10-CM

## 2019-05-18 DIAGNOSIS — B351 Tinea unguium: Secondary | ICD-10-CM

## 2019-05-18 DIAGNOSIS — M79675 Pain in left toe(s): Secondary | ICD-10-CM

## 2019-05-18 DIAGNOSIS — L84 Corns and callosities: Secondary | ICD-10-CM

## 2019-05-18 DIAGNOSIS — E1142 Type 2 diabetes mellitus with diabetic polyneuropathy: Secondary | ICD-10-CM

## 2019-05-18 NOTE — Patient Instructions (Signed)
Diabetes Mellitus and Foot Care Foot care is an important part of your health, especially when you have diabetes. Diabetes may cause you to have problems because of poor blood flow (circulation) to your feet and legs, which can cause your skin to:  Become thinner and drier.  Break more easily.  Heal more slowly.  Peel and crack. You may also have nerve damage (neuropathy) in your legs and feet, causing decreased feeling in them. This means that you may not notice minor injuries to your feet that could lead to more serious problems. Noticing and addressing any potential problems early is the best way to prevent future foot problems. How to care for your feet Foot hygiene  Wash your feet daily with warm water and mild soap. Do not use hot water. Then, pat your feet and the areas between your toes until they are completely dry. Do not soak your feet as this can dry your skin.  Trim your toenails straight across. Do not dig under them or around the cuticle. File the edges of your nails with an emery board or nail file.  Apply a moisturizing lotion or petroleum jelly to the skin on your feet and to dry, brittle toenails. Use lotion that does not contain alcohol and is unscented. Do not apply lotion between your toes. Shoes and socks  Wear clean socks or stockings every day. Make sure they are not too tight. Do not wear knee-high stockings since they may decrease blood flow to your legs.  Wear shoes that fit properly and have enough cushioning. Always look in your shoes before you put them on to be sure there are no objects inside.  To break in new shoes, wear them for just a few hours a day. This prevents injuries on your feet. Wounds, scrapes, corns, and calluses  Check your feet daily for blisters, cuts, bruises, sores, and redness. If you cannot see the bottom of your feet, use a mirror or ask someone for help.  Do not cut corns or calluses or try to remove them with medicine.  If you  find a minor scrape, cut, or break in the skin on your feet, keep it and the skin around it clean and dry. You may clean these areas with mild soap and water. Do not clean the area with peroxide, alcohol, or iodine.  If you have a wound, scrape, corn, or callus on your foot, look at it several times a day to make sure it is healing and not infected. Check for: ? Redness, swelling, or pain. ? Fluid or blood. ? Warmth. ? Pus or a bad smell. General instructions  Do not cross your legs. This may decrease blood flow to your feet.  Do not use heating pads or hot water bottles on your feet. They may burn your skin. If you have lost feeling in your feet or legs, you may not know this is happening until it is too late.  Protect your feet from hot and cold by wearing shoes, such as at the beach or on hot pavement.  Schedule a complete foot exam at least once a year (annually) or more often if you have foot problems. If you have foot problems, report any cuts, sores, or bruises to your health care provider immediately. Contact a health care provider if:  You have a medical condition that increases your risk of infection and you have any cuts, sores, or bruises on your feet.  You have an injury that is not   healing.  You have redness on your legs or feet.  You feel burning or tingling in your legs or feet.  You have pain or cramps in your legs and feet.  Your legs or feet are numb.  Your feet always feel cold.  You have pain around a toenail. Get help right away if:  You have a wound, scrape, corn, or callus on your foot and: ? You have pain, swelling, or redness that gets worse. ? You have fluid or blood coming from the wound, scrape, corn, or callus. ? Your wound, scrape, corn, or callus feels warm to the touch. ? You have pus or a bad smell coming from the wound, scrape, corn, or callus. ? You have a fever. ? You have a red line going up your leg. Summary  Check your feet every day  for cuts, sores, red spots, swelling, and blisters.  Moisturize feet and legs daily.  Wear shoes that fit properly and have enough cushioning.  If you have foot problems, report any cuts, sores, or bruises to your health care provider immediately.  Schedule a complete foot exam at least once a year (annually) or more often if you have foot problems. This information is not intended to replace advice given to you by your health care provider. Make sure you discuss any questions you have with your health care provider. Document Released: 08/16/2000 Document Revised: 10/01/2017 Document Reviewed: 09/20/2016 Elsevier Patient Education  2020 [employer]   Onychomycosis/Fungal Toenails  WHAT IS IT? An infection that lies within the keratin of your nail plate that is caused by a fungus.  WHY ME? Fungal infections affect all ages, sexes, races, and creeds.  There may be many factors that predispose you to a fungal infection such as age, coexisting medical conditions such as diabetes, or an autoimmune disease; stress, medications, fatigue, genetics, etc.  Bottom line: fungus thrives in a warm, moist environment and your shoes offer such a location.  IS IT CONTAGIOUS? Theoretically, yes.  You do not want to share shoes, nail clippers or files with someone who has fungal toenails.  Walking around barefoot in the same room or sleeping in the same bed is unlikely to transfer the organism.  It is important to realize, however, that fungus can spread easily from one nail to the next on the same foot.  HOW DO WE TREAT THIS?  There are several ways to treat this condition.  Treatment may depend on many factors such as age, medications, pregnancy, liver and kidney conditions, etc.  It is best to ask your doctor which options are available to you.  1. No treatment.   Unlike many other medical concerns, you can live with this condition.  However for many people this can be a painful condition and may lead to  ingrown toenails or a bacterial infection.  It is recommended that you keep the nails cut short to help reduce the amount of fungal nail. 2. Topical treatment.  These range from herbal remedies to prescription strength nail lacquers.  About 40-50% effective, topicals require twice daily application for approximately 9 to 12 months or until an entirely new nail has grown out.  The most effective topicals are medical grade medications available through physicians offices. 3. Oral antifungal medications.  With an 80-90% cure rate, the most common oral medication requires 3 to 4 months of therapy and stays in your system for a year as the new nail grows out.  Oral antifungal medications do require   blood work to make sure it is a safe drug for you.  A liver function panel will be performed prior to starting the medication and after the first month of treatment.  It is important to have the blood work performed to avoid any harmful side effects.  In general, this medication safe but blood work is required. 4. Laser Therapy.  This treatment is performed by applying a specialized laser to the affected nail plate.  This therapy is noninvasive, fast, and non-painful.  It is not covered by insurance and is therefore, out of pocket.  The results have been very good with a 80-95% cure rate.  [facility] is the only practice in the area to offer this therapy. 5. Permanent Nail Avulsion.  Removing the entire nail so that a new nail will not grow back. 

## 2019-05-20 ENCOUNTER — Ambulatory Visit (INDEPENDENT_AMBULATORY_CARE_PROVIDER_SITE_OTHER): Payer: Medicare Other | Admitting: Psychiatry

## 2019-05-20 ENCOUNTER — Encounter (HOSPITAL_COMMUNITY): Payer: Self-pay | Admitting: Psychiatry

## 2019-05-20 ENCOUNTER — Other Ambulatory Visit: Payer: Self-pay

## 2019-05-20 DIAGNOSIS — F330 Major depressive disorder, recurrent, mild: Secondary | ICD-10-CM | POA: Diagnosis not present

## 2019-05-20 DIAGNOSIS — F411 Generalized anxiety disorder: Secondary | ICD-10-CM

## 2019-05-20 MED ORDER — BUPROPION HCL 100 MG PO TABS: 100.0000 mg | ORAL_TABLET | Freq: Two times a day (BID) | ORAL | 0 refills | Status: DC

## 2019-05-20 MED ORDER — SERTRALINE HCL 100 MG PO TABS: 100.0000 mg | ORAL_TABLET | Freq: Every day | ORAL | 0 refills | Status: DC

## 2019-05-20 NOTE — Progress Notes (Signed)
  Virtual Visit via Telephone Note  I connected with [name] [name]  on 05/20/19 at 10:30 AM EDT by telephone and verified that I am speaking with the correct person using two identifiers.  Location: Patient: home Provider: office   I discussed the limitations, risks, security and privacy concerns of performing an evaluation and management service by telephone and the availability of in person appointments. I also discussed with the patient that there may be a patient responsible charge related to this service. The patient expressed understanding and agreed to proceed.   History of Present Illness: "I am hanging in there. I am in a good mood today". Pt is sleeping better since increasing the Zoloft. Her mood is "staying good and I ain't worrying about anything". She has some mild, background depression and thinks it will always be there. She denies SI/HI. Ajna has been getting out the house more and that helps.     Observations/Objective: I spoke with [name] [name] on the phone.  Pt was calm, pleasant and cooperative.  Pt was engaged in the conversation and answered questions appropriately.  Speech was clear and coherent with normal rate, tone and volume.  Mood is euthymic and affect is full. Thought processes are coherent, goal oriented and intact.  Thought content is logical.  Pt denies SI/HI.   Pt denies auditory and visual hallucinations and did not appear to be responding to internal stimuli.  Memory and concentration are good.  Fund of knowledge and use of language are average.  Insight and judgment are fair.  I am unable to comment on psychomotor activity, general appearance, hygiene, or eye contact as I was unable to physically see the patient on the phone.  Vital signs not available since interview conducted virtually.     Assessment and Plan: MDD- recurrent, mild; GAD   Status of current symptoms:  Improved mood and anxiety  Wellbutrin 100mg  po BID for  MDD Zoloft 100mg  po qAM for MDD and GAD  Follow Up Instructions: In 8-12 weeks or sooner if needed   I discussed the assessment and treatment plan with the patient. The patient was provided an opportunity to ask questions and all were answered. The patient agreed with the plan and demonstrated an understanding of the instructions.   The patient was advised to call back or seek an in-person evaluation if the symptoms worsen or if the condition fails to improve as anticipated.  I provided 15 minutes of non-face-to-face time during this encounter.   Charlcie Cradle, MD

## 2019-05-24 NOTE — Progress Notes (Signed)
Subjective:  [name] [name] presents to clinic today with h/o neuropathy with cc of  painful, thick, discolored, elongated toenails 1-5 b/l that become tender and cannot cut because of thickness. Pain is aggravated when wearing enclosed shoe gear.  Dickie La, MD is her PCP.    Current Outpatient Medications:  .  ACCU-CHEK FASTCLIX LANCETS MISC, TEST THREE TIMES DAILY, Disp: 306 each, Rfl: 12 .  acyclovir (ZOVIRAX) 200 MG capsule, Take 1 capsule (200 mg total) by mouth 2 (two) times daily., Disp: 180 capsule, Rfl: 3 .  albuterol (VENTOLIN HFA) 108 (90 Base) MCG/ACT inhaler, USE 2 PUFFS BY MOUTH INTO  THE LUNGS EVERY 4 HOURS AS  NEEDED FOR WHEEZING OR  SHORTNESS OF BREATH, Disp: 51 g, Rfl: 2 .  Alcohol Swabs (B-D SINGLE USE SWABS REGULAR) PADS, USE THREE TIMES DAILY, Disp: 300 each, Rfl: 3 .  allopurinol (ZYLOPRIM) 300 MG tablet, TAKE ONE TABLET BY MOUTH DAILY, Disp: 90 tablet, Rfl: 3 .  ammonium lactate (LAC-HYDRIN) 12 % lotion, APPLY 1 APPLICATION TOPICALLY TWICE DAILY AS NEEDED FOR DRY SKIN (SUBSTITUTED FOR LAC HYDRIN), Disp: 400 g, Rfl: 12 .  aspirin EC 81 MG tablet, Take 81 mg by mouth daily., Disp: , Rfl:  .  atorvastatin (LIPITOR) 40 MG tablet, TAKE 1 TABLET EVERY DAY, Disp: 90 tablet, Rfl: 3 .  azelastine (ASTELIN) 0.1 % nasal spray, USE 2 SPRAYS IN EACH NOSTRIL EVERY DAY AS NEEDED AS DIRECTED, Disp: 60 mL, Rfl: 3 .  B-D ULTRAFINE III SHORT PEN 31G X 8 MM MISC, USE TO INJECT THREE TIMES DAILY, Disp: 270 each, Rfl: 3 .  bisacodyl (DULCOLAX) 10 MG suppository, Place 1 suppository (10 mg total) rectally as needed for moderate constipation., Disp: 12 suppository, Rfl: 0 .  budesonide-formoterol (SYMBICORT) 160-4.5 MCG/ACT inhaler, INHALE 2 PUFFS INTO THE LUNGS 2 (TWO) TIMES DAILY. RINSE, GARGLE, AND SPIT AFTER USE., Disp: 3 Inhaler, Rfl: 3 .  buPROPion (WELLBUTRIN) 100 MG tablet, Take 1 tablet (100 mg total) by mouth 2 (two) times daily., Disp: 180 tablet, Rfl: 0 .  COMBIGAN  0.2-0.5 % ophthalmic solution, INSTILL 1 DROP INTO THE  LEFT EYE EVERY 12 HOURS, Disp: 5 mL, Rfl: 12 .  diclofenac sodium (VOLTAREN) 1 % GEL, Apply 2 g topically 4 (four) times daily., Disp: 100 g, Rfl: 0 .  Dulaglutide (TRULICITY) 1.5 0000000 SOPN, INJECT  1.5MG   INTO  THE  SKIN EVERY WEEK, Disp: 12 mL, Rfl: 3 .  EPINEPHrine 0.3 mg/0.3 mL IJ SOAJ injection, Inject 0.3 mLs (0.3 mg total) into the muscle once as needed (for allergic reaction)., Disp: 1 Device, Rfl: 3 .  esomeprazole (NEXIUM) 40 MG capsule, TAKE 1 CAPSULE EVERY DAY, Disp: 90 capsule, Rfl: 3 .  ezetimibe (ZETIA) 10 MG tablet, Take 1 tablet (10 mg total) by mouth daily., Disp: 90 tablet, Rfl: 3 .  famotidine (PEPCID) 40 MG tablet, Take 1 tablet (40 mg total) by mouth daily., Disp: 90 tablet, Rfl: 2 .  fluticasone (FLONASE) 50 MCG/ACT nasal spray, Place 2 sprays into both nostrils daily., Disp: 48 g, Rfl: 3 .  furosemide (LASIX) 40 MG tablet, Take one tablet by mouth daily, Disp: 90 tablet, Rfl: 3 .  glucose blood (PRODIGY NO CODING BLOOD GLUC) test strip, USE AS INSTRUCTED TO CHECK BLOOD SUGAR THREE TIMES A DAY, Disp: 300 each, Rfl: 12 .  Insulin Glargine (LANTUS SOLOSTAR) 100 UNIT/ML Solostar Pen, INJECT SUBCUTANEOUSLY 35  UNITS TWO TIMES DAILY, Disp: 75 mL, Rfl: 3 .  insulin lispro (HUMALOG) 100 UNIT/ML KwikPen, Inject 0.35 mLs (35 Units total) into the skin 2 (two) times daily after a meal., Disp: 75 mL, Rfl: 3 .  Insulin Syringe-Needle U-100 (BD INSULIN SYRINGE ULTRAFINE) 31G X 15/64" 0.5 ML MISC, Use to inject three times a day, Disp: 300 each, Rfl: 2 .  latanoprost (XALATAN) 0.005 % ophthalmic solution, Place 1 drop into both eyes at bedtime., Disp: 2.5 mL, Rfl: 3 .  levocetirizine (XYZAL) 5 MG tablet, Take 1 tablet (5 mg total) by mouth every evening. As needed for the hives/rash., Disp: 90 tablet, Rfl: 2 .  LINZESS 290 MCG CAPS capsule, Take 290 mcg by mouth daily as needed. , Disp: , Rfl:  .  losartan (COZAAR) 50 MG tablet,  Take 2 tablets (100 mg total) by mouth daily., Disp: 90 tablet, Rfl: 3 .  metoCLOPramide (REGLAN) 5 MG tablet, Take 1 tablet (5 mg total) by mouth 4 (four) times daily., Disp: 360 tablet, Rfl: 3 .  metoprolol succinate (TOPROL-XL) 50 MG 24 hr tablet, TAKE 3 TABLETS EVERY DAY WITH OR IMMEDIATELY FOLLOWING A MEAL, Disp: 270 tablet, Rfl: 3 .  montelukast (SINGULAIR) 10 MG tablet, Take 1 tablet (10 mg total) by mouth daily., Disp: 90 tablet, Rfl: 3 .  Multiple Vitamins-Minerals (MULTIVITAMIN PO), Take 1 tablet by mouth daily., Disp: , Rfl:  .  RESTASIS MULTIDOSE 0.05 % ophthalmic emulsion, Place 1 drop into both eyes daily as needed., Disp: , Rfl: 3 .  sertraline (ZOLOFT) 100 MG tablet, Take 1 tablet (100 mg total) by mouth daily., Disp: 90 tablet, Rfl: 0 .  spironolactone (ALDACTONE) 25 MG tablet, TAKE 1 TABLET EVERY DAY BEFORE SUPPER, Disp: 90 tablet, Rfl: 3 .  traMADol (ULTRAM) 50 MG tablet, TAKE 1-2 TABLETS BY MOUTH EVERY 8 HOURS AS NEEDED FOR PAIN. MAX 6 TABLETS PER 24 HOURS, Disp: 180 tablet, Rfl: 5   Allergies  Allergen Reactions  . Bee Venom Hives and Swelling  . Propoxyphene Hcl Itching  . Amlodipine Besylate Swelling  . Hydrocodone Other (See Comments)    With Vicodin - makes patient "jittery", and "hangover effect - sleepy next day"  . Lisinopril Cough    Changed to ARB  . Metformin And Related Diarrhea    GI distress  . Metronidazole Other (See Comments)    REACTION: "just didn't work; my body never did heal from it"  . Valsartan Other (See Comments)    REACTION:  "sleep more the next day after I took it; it made me real tired"  . Xyzal [Levocetirizine Dihydrochloride] Rash     Objective: Physical Examination:  Vascular Examination: Capillary refill time immediate x 10 digits.  Palpable DP/PT pulses b/l.  Digital hair absent b/l.  No edema noted b/l.  Skin temperature gradient WNL b/l.  Dermatological Examination: Skin with normal turgor, texture and tone b/l.  No  open wounds b/l.  No interdigital macerations noted b/l.  Elongated, thick, discolored brittle toenails with subungual debris and pain on dorsal palpation of nailbeds 1-5 b/l.  Hyperkeratotic lesion submet head 4 left foot, submet head 1 right foot and b/l heels with tenderness to palpation. No edema, no erythema, no drainage, no flocculence.  Musculoskeletal Examination: Muscle strength 5/5 to all muscle groups b/l.  Hammertoes b/l.  Pes planus b/l.   No pain, crepitus or joint discomfort with active/passive ROM.  Neurological Examination: Sensation intact 5/5 b/l with 10 gram monofilament.  Assessment: Mycotic nail infection with pain 1-5 b/l Calluses submet head 4 left  foot, submet head 1 right foot and b/l heels NIDDM with neuropathy  Plan: 1. Toenails 1-5 b/l were debrided in length and girth without iatrogenic laceration. 2. Calluses pared submet head 4 left foot, submet head 1 right foot and b/l heels utilizing sterile scalpel blade without incident. 3. Continue soft, supportive shoe gear daily. 4. Report any pedal injuries to medical professional. 5. Follow up 3 months. 6. Patient/POA to call should there be a question/concern in there interim.

## 2019-05-28 DIAGNOSIS — G4733 Obstructive sleep apnea (adult) (pediatric): Secondary | ICD-10-CM | POA: Diagnosis not present

## 2019-06-05 ENCOUNTER — Other Ambulatory Visit: Payer: Self-pay | Admitting: Allergy

## 2019-06-05 ENCOUNTER — Other Ambulatory Visit: Payer: Self-pay | Admitting: Family Medicine

## 2019-06-27 DIAGNOSIS — G4733 Obstructive sleep apnea (adult) (pediatric): Secondary | ICD-10-CM | POA: Diagnosis not present

## 2019-07-01 DIAGNOSIS — K5904 Chronic idiopathic constipation: Secondary | ICD-10-CM | POA: Diagnosis not present

## 2019-07-01 DIAGNOSIS — K5730 Diverticulosis of large intestine without perforation or abscess without bleeding: Secondary | ICD-10-CM | POA: Diagnosis not present

## 2019-07-01 DIAGNOSIS — R1032 Left lower quadrant pain: Secondary | ICD-10-CM | POA: Diagnosis not present

## 2019-07-01 DIAGNOSIS — K219 Gastro-esophageal reflux disease without esophagitis: Secondary | ICD-10-CM | POA: Diagnosis not present

## 2019-07-14 ENCOUNTER — Other Ambulatory Visit: Payer: Self-pay

## 2019-07-14 ENCOUNTER — Encounter: Payer: Self-pay | Admitting: Family Medicine

## 2019-07-14 ENCOUNTER — Ambulatory Visit (INDEPENDENT_AMBULATORY_CARE_PROVIDER_SITE_OTHER): Payer: Medicare Other | Admitting: Family Medicine

## 2019-07-14 VITALS — BP 130/86 | HR 70 | Wt 198.4 lb

## 2019-07-14 DIAGNOSIS — E1149 Type 2 diabetes mellitus with other diabetic neurological complication: Secondary | ICD-10-CM | POA: Diagnosis not present

## 2019-07-14 DIAGNOSIS — E785 Hyperlipidemia, unspecified: Secondary | ICD-10-CM | POA: Diagnosis not present

## 2019-07-14 DIAGNOSIS — Z23 Encounter for immunization: Secondary | ICD-10-CM

## 2019-07-14 DIAGNOSIS — G629 Polyneuropathy, unspecified: Secondary | ICD-10-CM

## 2019-07-14 DIAGNOSIS — I10 Essential (primary) hypertension: Secondary | ICD-10-CM | POA: Diagnosis not present

## 2019-07-14 DIAGNOSIS — F339 Major depressive disorder, recurrent, unspecified: Secondary | ICD-10-CM

## 2019-07-14 LAB — POCT GLYCOSYLATED HEMOGLOBIN (HGB A1C): HbA1c, POC (controlled diabetic range): 5.8 % (ref 0.0–7.0)

## 2019-07-14 MED ORDER — GABAPENTIN 300 MG PO CAPS: ORAL_CAPSULE | ORAL | 3 refills | Status: DC

## 2019-07-14 NOTE — Patient Instructions (Signed)
Gabapentin taper up Days 1-7 take one at night Days 8-14 take 2 at night Starting day 15 take 3 at night

## 2019-07-15 LAB — COMPREHENSIVE METABOLIC PANEL
ALT: 31 IU/L (ref 0–32)
AST: 30 IU/L (ref 0–40)
Albumin/Globulin Ratio: 1.7 (ref 1.2–2.2)
Albumin: 4 g/dL (ref 3.8–4.8)
Alkaline Phosphatase: 124 IU/L — ABNORMAL HIGH (ref 39–117)
BUN/Creatinine Ratio: 13 (ref 12–28)
BUN: 9 mg/dL (ref 8–27)
Bilirubin Total: 0.2 mg/dL (ref 0.0–1.2)
CO2: 27 mmol/L (ref 20–29)
Calcium: 9.5 mg/dL (ref 8.7–10.3)
Chloride: 102 mmol/L (ref 96–106)
Creatinine, Ser: 0.68 mg/dL (ref 0.57–1.00)
GFR calc Af Amer: 105 mL/min/{1.73_m2} (ref >59)
GFR calc non Af Amer: 91 mL/min/{1.73_m2} (ref >59)
Globulin, Total: 2.4 g/dL (ref 1.5–4.5)
Glucose: 75 mg/dL (ref 65–99)
Potassium: 4.1 mmol/L (ref 3.5–5.2)
Sodium: 143 mmol/L (ref 134–144)
Total Protein: 6.4 g/dL (ref 6.0–8.5)

## 2019-07-15 LAB — LDL CHOLESTEROL, DIRECT: LDL Direct: 65 mg/dL (ref 0–99)

## 2019-07-15 NOTE — Assessment & Plan Note (Signed)
Currently stable.  She will continue follow-up with psychiatry and cognitive behavioral therapist.

## 2019-07-15 NOTE — Assessment & Plan Note (Signed)
A1c is in excellent range today.  And I applauded her efforts.  Continue current regimen

## 2019-07-15 NOTE — Assessment & Plan Note (Signed)
I reviewed her previous treatments of Lyrica and gabapentin.  We also discussed other alternatives which did not seem appropriate for her.  We will restart gabapentin as per taper in AVS.  She will let me know at next office visit in 2 to 3 months how this is going.

## 2019-07-15 NOTE — Progress Notes (Signed)
    CHIEF COMPLAINT / HPI: #1.  Follow-up diabetes mellitus: Has been doing pretty well with her diet.  Not exercising regularly due to Covid.  No episodes of low blood sugar.  No problems with her medicines 2.  Hypertension: Taking medicines regularly without issue.  She thinks it is time for blood work 3.  Chronic flank pain: Little less troublesome over the last 1 to 2 months but she is not sure why. 4.  Depression followed by psychiatry: Stable by her report #5.  Neuropathy: Longstanding neuropathy in bilateral feet in approximately 2 mid calf.  In the last few months she has noticed that with changing weather she has increasing extension up into her thighs particularly the right thigh.  This happens every winter.  She would like to consider going back on a medication.  Wants to know if there are any new medications for this.  REVIEW OF SYSTEMS: No cough, no fever, no unusual weight change.  See HPI.  PERTINENT  PMH / PSH: I have reviewed the patient's medications, allergies, past medical and surgical history, smoking status and updated in the EMR as appropriate.   OBJECTIVE:  Vital signs reviewed. HEENT: Legally blind GENERAL: Well-developed, well-nourished, no acute distress. CARDIOVASCULAR: Regular rate and rhythm no murmur gallop or rub LUNGS: Clear to auscultation bilaterally, no rales or wheeze. ABDOMEN: Soft positive bowel sounds NEURO: No gross focal neurological deficits.  Decreased soft touch sensation bilateral lower extremity to the proximal tibia.  She has altered sensation in the right anterior thigh area.  She has some resting nystagmus bilateral eyes. SKIN: Skin of the lower extremities is without any unusual erythema, no lesions, no hyperpigmentation. PSYCH: AxOx4. Good eye contact.. No psychomotor retardation or agitation. Appropriate speech fluency and content. Asks and answers questions appropriately. Mood is congruent. MSK: Movement of extremity x 4.     ASSESSMENT / PLAN:   Diabetes mellitus type 2 with neurological manifestations (HCC) A1c is in excellent range today.  And I applauded her efforts.  Continue current regimen  Major depressive disorder, recurrent episode Currently stable.  She will continue follow-up with psychiatry and cognitive behavioral therapist.  Neuropathy I reviewed her previous treatments of Lyrica and gabapentin.  We also discussed other alternatives which did not seem appropriate for her.  We will restart gabapentin as per taper in AVS.  She will let me know at next office visit in 2 to 3 months how this is going.

## 2019-07-20 ENCOUNTER — Encounter: Payer: Self-pay | Admitting: Family Medicine

## 2019-07-21 ENCOUNTER — Telehealth: Payer: Self-pay

## 2019-07-21 ENCOUNTER — Other Ambulatory Visit: Payer: Self-pay | Admitting: Family Medicine

## 2019-07-21 ENCOUNTER — Other Ambulatory Visit: Payer: Self-pay | Admitting: Allergy

## 2019-07-21 NOTE — Telephone Encounter (Signed)
Famotidine medication is out of stock at patients current pharmacy and will need to see if she could get it another place or over the counter. Patient will need to also set up an OV for her follow up.

## 2019-07-21 NOTE — Telephone Encounter (Signed)
Patient has been called and left voicemail asking to return call to discuss.

## 2019-07-22 ENCOUNTER — Ambulatory Visit (HOSPITAL_COMMUNITY): Payer: Medicare Other | Admitting: Psychiatry

## 2019-07-27 NOTE — Telephone Encounter (Signed)
Left a message again regarding medication and office visit.

## 2019-07-28 DIAGNOSIS — G4733 Obstructive sleep apnea (adult) (pediatric): Secondary | ICD-10-CM | POA: Diagnosis not present

## 2019-07-28 MED ORDER — FAMOTIDINE 40 MG PO TABS: 40.0000 mg | ORAL_TABLET | Freq: Every day | ORAL | 0 refills | Status: DC

## 2019-07-28 NOTE — Addendum Note (Signed)
Addended by: Herbie Drape on: 07/28/2019 10:26 AM   Modules accepted: Orders

## 2019-07-28 NOTE — Telephone Encounter (Signed)
Spoke with patient and she would like it sent to the [facility] on [address]. Rx has been sent to requested pharmacy.

## 2019-08-05 ENCOUNTER — Other Ambulatory Visit: Payer: Self-pay

## 2019-08-05 ENCOUNTER — Encounter (HOSPITAL_COMMUNITY): Payer: Self-pay | Admitting: Psychiatry

## 2019-08-05 ENCOUNTER — Ambulatory Visit (INDEPENDENT_AMBULATORY_CARE_PROVIDER_SITE_OTHER): Payer: Medicare Other | Admitting: Psychiatry

## 2019-08-05 DIAGNOSIS — F411 Generalized anxiety disorder: Secondary | ICD-10-CM

## 2019-08-05 DIAGNOSIS — F330 Major depressive disorder, recurrent, mild: Secondary | ICD-10-CM | POA: Diagnosis not present

## 2019-08-05 MED ORDER — SERTRALINE HCL 100 MG PO TABS: 100.0000 mg | ORAL_TABLET | Freq: Every day | ORAL | 0 refills | Status: DC

## 2019-08-05 MED ORDER — BUPROPION HCL 100 MG PO TABS: 100.0000 mg | ORAL_TABLET | Freq: Two times a day (BID) | ORAL | 0 refills | Status: DC

## 2019-08-05 NOTE — Progress Notes (Signed)
  Virtual Visit via Telephone Note  I connected with [name] [name]  on 08/05/19 at  3:45 PM EST by telephone and verified that I am speaking with the correct person using two identifiers.  Location: Patient: home Provider: office   I discussed the limitations, risks, security and privacy concerns of performing an evaluation and management service by telephone and the availability of in person appointments. I also discussed with the patient that there may be a patient responsible charge related to this service. The patient expressed understanding and agreed to proceed.   History of Present Illness: Pt states she is doing well. She is keeping positive attitude. Her depression and anxiety are stable. Sleep is good and she is able to sleep thru the night. [name] [name] denies hopelessness. She denies SI/HI.     Observations/Objective: I spoke with [name] [name] on the phone.  Pt was calm, pleasant and cooperative.  Pt was engaged in the conversation and answered questions appropriately.  Speech was clear and coherent with normal rate, tone and volume.  Mood is euthymic and affect is full. Thought processes are coherent, goal oriented and intact.  Thought content is logical.  Pt denies SI/HI.   Pt denies auditory and visual hallucinations and did not appear to be responding to internal stimuli.  Memory and concentration are good.  Fund of knowledge and use of language are average.  Insight and judgment are fair.  I am unable to comment on psychomotor activity, general appearance, hygiene, or eye contact as I was unable to physically see the patient on the phone.  Vital signs not available since interview conducted virtually.     Assessment and Plan: MDD- recurrent, mild; GAD  Status of current symptoms: stable  Zoloft 100mg  po qAM for MDD and GAD Wellbutrin 100mg  po BID for MDD  Follow Up Instructions: In 4 months or sooner if needed   I discussed the assessment and treatment  plan with the patient. The patient was provided an opportunity to ask questions and all were answered. The patient agreed with the plan and demonstrated an understanding of the instructions.   The patient was advised to call back or seek an in-person evaluation if the symptoms worsen or if the condition fails to improve as anticipated.  I provided 25 minutes of non-face-to-face time during this encounter.   Charlcie Cradle, MD

## 2019-08-11 ENCOUNTER — Other Ambulatory Visit: Payer: Self-pay

## 2019-08-11 ENCOUNTER — Encounter: Payer: Self-pay | Admitting: Allergy

## 2019-08-11 ENCOUNTER — Ambulatory Visit (INDEPENDENT_AMBULATORY_CARE_PROVIDER_SITE_OTHER): Payer: Medicare Other | Admitting: Allergy

## 2019-08-11 VITALS — BP 116/72 | HR 72 | Temp 97.2°F | Resp 18

## 2019-08-11 DIAGNOSIS — K219 Gastro-esophageal reflux disease without esophagitis: Secondary | ICD-10-CM

## 2019-08-11 DIAGNOSIS — J3089 Other allergic rhinitis: Secondary | ICD-10-CM | POA: Diagnosis not present

## 2019-08-11 DIAGNOSIS — R21 Rash and other nonspecific skin eruption: Secondary | ICD-10-CM | POA: Diagnosis not present

## 2019-08-11 DIAGNOSIS — Z91030 Bee allergy status: Secondary | ICD-10-CM

## 2019-08-11 DIAGNOSIS — J302 Other seasonal allergic rhinitis: Secondary | ICD-10-CM

## 2019-08-11 DIAGNOSIS — J4540 Moderate persistent asthma, uncomplicated: Secondary | ICD-10-CM | POA: Diagnosis not present

## 2019-08-11 MED ORDER — FAMOTIDINE 40 MG PO TABS: 40.0000 mg | ORAL_TABLET | Freq: Every day | ORAL | 2 refills | Status: DC

## 2019-08-11 MED ORDER — BUDESONIDE-FORMOTEROL FUMARATE 160-4.5 MCG/ACT IN AERO: INHALATION_SPRAY | RESPIRATORY_TRACT | 1 refills | Status: DC

## 2019-08-11 NOTE — Progress Notes (Signed)
Follow Up Note  RE: [name] [name] MRN: [id number] DOB: 26-Mar-1951 Date of Office Visit: 08/11/2019  Referring provider: Dickie La, MD Primary care provider: Dickie La, MD  Chief Complaint: Follow-up  History of Present Illness: I had the pleasure of seeing [name] [name] for a follow up visit at the [facility] on 08/11/2019. She is a 68 y.o. female, who is being followed for asthma, allergic rhinitis, LPRD, bee sting allergy, rash. Today she is here for regular follow up visit. Her previous allergy office visit was on 03/10/2019 with Dr. Maudie Mercury.   Moderate persistent asthma Currently on Symbicort 160 2 puffs twice a day with spacer and Singulair daily.  Using albuterol once a month for exertion related shortness of breath.   Otherwise, denies any SOB, coughing, wheezing, chest tightness, nocturnal awakenings, ER/urgent care visits or prednisone use since the last visit.  Seasonal and perennial allergic rhinitis Currently on Flonase 1 spray twice a day and azelastine nasal spray prn with good benefit.   LPRD (laryngopharyngeal reflux disease) Currently on Nexium 40mg  twice a day.  The Pepcid via [facility] mail order was out of stock and had to get it from local pharmacy. Would like 90 day supply.   Bee sting allergy No stings since last visit.   Rash and nonspecific skin eruption Found out that she had bed bugs that were giving her this rash. They did clean out the house but sometimes feels like there are some other bugs biting her.   Assessment and Plan: [name] is a 68 y.o. female with: Moderate persistent asthma without complication Today's ACT score was 23. Asthma is doing much better than last time.   Daily controller medication(s):continue Symbicort 160 2 puffs twice a day with spacer and rinse mouth afterwards. ? Continue Singulair 10mg  daily   Prior to physical activity:May use albuterol rescue inhaler 2 puffs 5 to 15 minutes prior to  strenuous physical activities.  Rescue medications:May use albuterol rescue inhaler 2 puffs or nebulizer every 4 to 6 hours as needed for shortness of breath, chest tightness, coughing, and wheezing. Monitor frequency of use.  Will get spirometry at next visit instead of today due to COVID-19 pandemic and trying to minimize any type of aerosolizing procedures at this time in the office.    Seasonal and perennial allergic rhinitis Past history - 2018 skin testing was positive to dust mites and tree pollen. Interim history - stable with below regimen.   Continue environmental control measures.  Use Flonase 1 spray twice a day and will recommend to wean down to minimal amount needed.  May use azelastine nasal spray 1-2 sprays per nostril twice a day as needed for drainage.   Nasal saline spray (i.e., Simply Saline) or nasal saline lavage (i.e., NeilMed) is recommended as needed and prior to medicated nasal sprays.  May use over the counter antihistamines such as Zyrtec (cetirizine), Claritin (loratadine), Allegra (fexofenadine), or Xyzal (levocetirizine) daily as needed.  LPRD (laryngopharyngeal reflux disease) Stable. Issues with [facility] out of Pepcid.   Continue Nexium 40mg  twice a day.   Continue pepcid 40mg  daily - sent in 90 day Rx to local pharmacy.   Bee sting allergy No stings since last visit.   For mild symptoms you can take over the counter antihistamines such as Benadryl and monitor symptoms closely. If symptoms worsen or if you have severe symptoms including breathing issues, throat closure, significant swelling, whole body hives, severe diarrhea and vomiting, lightheadedness  then inject epinephrine and seek immediate medical care afterwards.  Consider testing in future.  Rash and nonspecific skin eruption Found to have bed bugs at home. Rash is better now.   Monitor symptoms.   Return in about 4 months (around 12/10/2019).  Meds ordered this encounter  Medications   . budesonide-formoterol (SYMBICORT) 160-4.5 MCG/ACT inhaler    Sig: INHALE 2 PUFFS INTO THE LUNGS 2 (TWO) TIMES DAILY. RINSE, GARGLE, AND SPIT AFTER USE.    Dispense:  3 Inhaler    Refill:  1  . famotidine (PEPCID) 40 MG tablet    Sig: Take 1 tablet (40 mg total) by mouth daily.    Dispense:  90 tablet    Refill:  2   Diagnostics: None.  Medication List:  Current Outpatient Medications  Medication Sig Dispense Refill  . ACCU-CHEK FASTCLIX LANCETS MISC TEST THREE TIMES DAILY 306 each 12  . acyclovir (ZOVIRAX) 200 MG capsule TAKE 1 CAPSULE BY MOUTH TWO TIMES DAILY 180 capsule 3  . albuterol (VENTOLIN HFA) 108 (90 Base) MCG/ACT inhaler USE 2 PUFFS BY MOUTH INTO  THE LUNGS EVERY 4 HOURS AS  NEEDED FOR WHEEZING OR  SHORTNESS OF BREATH 51 g 2  . Alcohol Swabs (B-D SINGLE USE SWABS REGULAR) PADS USE THREE TIMES DAILY 300 each 3  . allopurinol (ZYLOPRIM) 300 MG tablet TAKE 1 TABLET BY MOUTH  DAILY 90 tablet 3  . ammonium lactate (LAC-HYDRIN) 12 % lotion APPLY 1 APPLICATION TOPICALLY TWICE DAILY AS NEEDED FOR DRY SKIN (SUBSTITUTED FOR LAC HYDRIN) 400 g 12  . aspirin EC 81 MG tablet Take 81 mg by mouth daily.    [name] atorvastatin (LIPITOR) 40 MG tablet TAKE 1 TABLET BY MOUTH  EVERY DAY 90 tablet 3  . azelastine (ASTELIN) 0.1 % nasal spray USE 2 SPRAYS IN EACH NOSTRIL EVERY DAY AS NEEDED AS DIRECTED 60 mL 3  . B-D ULTRAFINE III SHORT PEN 31G X 8 MM MISC USE TO INJECT THREE TIMES DAILY 270 each 3  . bisacodyl (DULCOLAX) 10 MG suppository Place 1 suppository (10 mg total) rectally as needed for moderate constipation. 12 suppository 0  . budesonide-formoterol (SYMBICORT) 160-4.5 MCG/ACT inhaler INHALE 2 PUFFS INTO THE LUNGS 2 (TWO) TIMES DAILY. RINSE, GARGLE, AND SPIT AFTER USE. 3 Inhaler 1  . buPROPion (WELLBUTRIN) 100 MG tablet Take 1 tablet (100 mg total) by mouth 2 (two) times daily. 180 tablet 0  . COMBIGAN 0.2-0.5 % ophthalmic solution INSTILL 1 DROP INTO THE  LEFT EYE EVERY 12 HOURS 5 mL 12  .  diclofenac sodium (VOLTAREN) 1 % GEL Apply 2 g topically 4 (four) times daily. 100 g 0  . Dulaglutide (TRULICITY) 1.5 0000000 SOPN INJECT  1.5MG   INTO  THE  SKIN EVERY WEEK 12 mL 3  . EPINEPHrine 0.3 mg/0.3 mL IJ SOAJ injection Inject 0.3 mLs (0.3 mg total) into the muscle once as needed (for allergic reaction). 1 Device 3  . esomeprazole (NEXIUM) 40 MG capsule TAKE 1 CAPSULE EVERY DAY 90 capsule 3  . ezetimibe (ZETIA) 10 MG tablet Take 1 tablet (10 mg total) by mouth daily. 90 tablet 3  . famotidine (PEPCID) 40 MG tablet Take 1 tablet (40 mg total) by mouth daily. 90 tablet 2  . fluticasone (FLONASE) 50 MCG/ACT nasal spray USE 2 SPRAYS IN EACH  NOSTRIL DAILY 48 g 0  . furosemide (LASIX) 40 MG tablet Take one tablet by mouth daily 90 tablet 3  . glucose blood (PRODIGY NO CODING BLOOD GLUC)  test strip USE AS INSTRUCTED TO CHECK BLOOD SUGAR THREE TIMES A DAY 300 each 12  . Insulin Glargine (LANTUS SOLOSTAR) 100 UNIT/ML Solostar Pen INJECT SUBCUTANEOUSLY 35  UNITS TWO TIMES DAILY 75 mL 3  . insulin lispro (HUMALOG) 100 UNIT/ML KwikPen Inject 0.35 mLs (35 Units total) into the skin 2 (two) times daily after a meal. 75 mL 3  . Insulin Syringe-Needle U-100 (BD INSULIN SYRINGE ULTRAFINE) 31G X 15/64" 0.5 ML MISC Use to inject three times a day 300 each 2  . latanoprost (XALATAN) 0.005 % ophthalmic solution Place 1 drop into both eyes at bedtime. 2.5 mL 3  . levocetirizine (XYZAL) 5 MG tablet Take 1 tablet (5 mg total) by mouth every evening. As needed for the hives/rash. 90 tablet 2  . LINZESS 290 MCG CAPS capsule Take 290 mcg by mouth daily as needed.     [name] losartan (COZAAR) 50 MG tablet Take 2 tablets (100 mg total) by mouth daily. 90 tablet 3  . metoCLOPramide (REGLAN) 5 MG tablet TAKE 1 TABLET BY MOUTH 4  TIMES DAILY 360 tablet 3  . metoprolol succinate (TOPROL-XL) 50 MG 24 hr tablet TAKE 3 TABLETS EVERY DAY WITH OR IMMEDIATELY FOLLOWING A MEAL 270 tablet 3  . montelukast (SINGULAIR) 10 MG tablet  TAKE 1 TABLET BY MOUTH  DAILY 90 tablet 3  . Multiple Vitamins-Minerals (MULTIVITAMIN PO) Take 1 tablet by mouth daily.    . RESTASIS MULTIDOSE 0.05 % ophthalmic emulsion Place 1 drop into both eyes daily as needed.  3  . sertraline (ZOLOFT) 100 MG tablet Take 1 tablet (100 mg total) by mouth daily. 90 tablet 0  . spironolactone (ALDACTONE) 25 MG tablet TAKE 1 TABLET BY MOUTH  EVERY DAY BEFORE SUPPER 90 tablet 3  . traMADol (ULTRAM) 50 MG tablet TAKE 1-2 TABLETS BY MOUTH EVERY 8 HOURS AS NEEDED FOR PAIN. MAX 6 TABLETS PER 24 HOURS 180 tablet 5  . gabapentin (NEURONTIN) 300 MG capsule Take by mouth with taper up to 3 at bedtime (Patient not taking: Reported on 08/11/2019) 270 capsule 3   No current facility-administered medications for this visit.    Allergies: Allergies  Allergen Reactions  . Bee Venom Hives and Swelling  . Propoxyphene Hcl Itching  . Amlodipine Besylate Swelling  . Hydrocodone Other (See Comments)    With Vicodin - makes patient "jittery", and "hangover effect - sleepy next day"  . Lisinopril Cough    Changed to ARB  . Metformin And Related Diarrhea    GI distress  . Metronidazole Other (See Comments)    REACTION: "just didn't work; my body never did heal from it"  . Valsartan Other (See Comments)    REACTION:  "sleep more the next day after I took it; it made me real tired"  . Xyzal [Levocetirizine Dihydrochloride] Rash   I reviewed her past medical history, social history, family history, and environmental history and no significant changes have been reported from her previous visit.  Review of Systems  Constitutional: Negative for appetite change, chills, fever and unexpected weight change.  HENT: Positive for congestion. Negative for rhinorrhea.   Eyes: Negative for itching.  Respiratory: Negative for cough, chest tightness, shortness of breath and wheezing.   Gastrointestinal: Negative for abdominal pain.  Skin: Negative for rash.  Allergic/Immunologic:  Positive for environmental allergies. Negative for food allergies.  Neurological: Negative for headaches.   Objective: BP 116/72 (BP Location: Left Arm, Patient Position: Sitting, Cuff Size: Normal)   Pulse 72  Temp (!) 97.2 F (36.2 C) (Temporal)   Resp 18   SpO2 96%  There is no height or weight on file to calculate BMI. Physical Exam  Constitutional: She is oriented to person, place, and time. She appears well-developed and well-nourished.  HENT:  Head: Normocephalic and atraumatic.  Right Ear: External ear normal.  Left Ear: External ear normal.  Nose: Nose normal.  Mouth/Throat: Oropharynx is clear and moist.  Eyes: Conjunctivae and EOM are normal.  Neck: Neck supple.  Cardiovascular: Normal rate, regular rhythm and normal heart sounds. Exam reveals no gallop and no friction rub.  No murmur heard. Pulmonary/Chest: Effort normal and breath sounds normal. She has no wheezes. She has no rales.  Neurological: She is alert and oriented to person, place, and time.  Skin: Skin is warm. No rash noted.  Psychiatric: She has a normal mood and affect. Her behavior is normal.  Nursing note and vitals reviewed.  Previous notes and tests were reviewed. The plan was reviewed with the patient/family, and all questions/concerned were addressed.  It was my pleasure to see Marlee today and participate in her care. Please feel free to contact me with any questions or concerns.  Sincerely,  Rexene Alberts, DO Allergy & Immunology  [facility] office: [phone] [facility] office: [place] office: [phone]

## 2019-08-11 NOTE — Patient Instructions (Addendum)
Moderate persistent asthma without complication  Daily controller medication(s):continue Symbicort 160 2 puffs twice a day with spacer and rinse mouth afterwards. ? Continue Singulair 10mg  daily   Prior to physical activity:May use albuterol rescue inhaler 2 puffs 5 to 15 minutes prior to strenuous physical activities.  Rescue medications:May use albuterol rescue inhaler 2 puffs or nebulizer every 4 to 6 hours as needed for shortness of breath, chest tightness, coughing, and wheezing. Monitor frequency of use. Asthma control goals:  Full participation in all desired activities (may need albuterol before activity) Albuterol use two times or less a week on average (not counting use with activity) Cough interfering with sleep two times or less a month Oral steroids no more than once a year No hospitalizations  Seasonal and perennial allergic rhinitis Past history - 2018 skin testing was positive to dust mites and tree pollen.  Continue environmental control measures.  Use Flonase 1 spray twice a day.  May use azelastine nasal spray 1-2 sprays per nostril twice a day as needed for drainage.   Nasal saline spray (i.e., Simply Saline) or nasal saline lavage (i.e., NeilMed) is recommended as needed and prior to medicated nasal sprays.  May use over the counter antihistamines such as Zyrtec (cetirizine), Claritin (loratadine), Allegra (fexofenadine), or Xyzal (levocetirizine) daily as needed.  LPRD (laryngopharyngeal reflux disease)  Continue nexium 40mg  twice a day.   Continue pepcid 40mg  daily.   Bee sting allergy  For mild symptoms you can take over the counter antihistamines such as Benadryl and monitor symptoms closely. If symptoms worsen or if you have severe symptoms including breathing issues, throat closure, significant swelling, whole body hives, severe diarrhea and vomiting, lightheadedness then inject epinephrine and seek immediate medical care afterwards.  Consider  testing in future.  Follow up in 3-4 months.   Control of House Dust Mite Allergen . Dust mite allergens are a common trigger of allergy and asthma symptoms. While they can be found throughout the house, these microscopic creatures thrive in warm, humid environments such as bedding, upholstered furniture and carpeting. . Because so much time is spent in the bedroom, it is essential to reduce mite levels there.  . Encase pillows, mattresses, and box springs in special allergen-proof fabric covers or airtight, zippered plastic covers.  . Bedding should be washed weekly in hot water (130 F) and dried in a hot dryer. Allergen-proof covers are available for comforters and pillows that can't be regularly washed.  Wendee Copp the allergy-proof covers every few months. Minimize clutter in the bedroom. Keep pets out of the bedroom.  [name] Keep humidity less than 50% by using a dehumidifier or air conditioning. You can buy a humidity measuring device called a hygrometer to monitor this.  . If possible, replace carpets with hardwood, linoleum, or washable area rugs. If that's not possible, vacuum frequently with a vacuum that has a HEPA filter. . Remove all upholstered furniture and non-washable window drapes from the bedroom. . Remove all non-washable stuffed toys from the bedroom.  Wash stuffed toys weekly. Reducing Pollen Exposure . Pollen seasons: trees (spring), grass (summer) and ragweed/weeds (fall). [name] Keep windows closed in your home and car to lower pollen exposure.  Susa Simmonds air conditioning in the bedroom and throughout the house if possible.  . Avoid going out in dry windy days - especially early morning. . Pollen counts are highest between 5 - 10 AM and on dry, hot and windy days.  . Save outside activities for late afternoon or  after a heavy rain, when pollen levels are lower.  . Avoid mowing of grass if you have grass pollen allergy. [name] Be aware that pollen can also be transported indoors on  people and pets.  . Dry your clothes in an automatic dryer rather than hanging them outside where they might collect pollen.  . Rinse hair and eyes before bedtime.

## 2019-08-11 NOTE — Assessment & Plan Note (Signed)
No stings since last visit.  °· For mild symptoms you can take over the counter antihistamines such as Benadryl and monitor symptoms closely. If symptoms worsen or if you have severe symptoms including breathing issues, throat closure, significant swelling, whole body hives, severe diarrhea and vomiting, lightheadedness then inject epinephrine and seek immediate medical care afterwards. °· Consider testing in future. °

## 2019-08-11 NOTE — Assessment & Plan Note (Addendum)
Past history - 2018 skin testing was positive to dust mites and tree pollen. Interim history - stable with below regimen.   Continue environmental control measures.  Use Flonase 1 spray twice a day and will recommend to wean down to minimal amount needed.  May use azelastine nasal spray 1-2 sprays per nostril twice a day as needed for drainage.   Nasal saline spray (i.e., Simply Saline) or nasal saline lavage (i.e., NeilMed) is recommended as needed and prior to medicated nasal sprays.  May use over the counter antihistamines such as Zyrtec (cetirizine), Claritin (loratadine), Allegra (fexofenadine), or Xyzal (levocetirizine) daily as needed.

## 2019-08-11 NOTE — Assessment & Plan Note (Signed)
Today's ACT score was 23. Asthma is doing much better than last time.   Daily controller medication(s):continue Symbicort 160 2 puffs twice a day with spacer and rinse mouth afterwards. ? Continue Singulair 10mg  daily   Prior to physical activity:May use albuterol rescue inhaler 2 puffs 5 to 15 minutes prior to strenuous physical activities.  Rescue medications:May use albuterol rescue inhaler 2 puffs or nebulizer every 4 to 6 hours as needed for shortness of breath, chest tightness, coughing, and wheezing. Monitor frequency of use.  Will get spirometry at next visit instead of today due to COVID-19 pandemic and trying to minimize any type of aerosolizing procedures at this time in the office.

## 2019-08-11 NOTE — Assessment & Plan Note (Signed)
Found to have bed bugs at home. Rash is better now.   Monitor symptoms.

## 2019-08-11 NOTE — Assessment & Plan Note (Signed)
Stable. Issues with [facility] out of Pepcid.   Continue Nexium 40mg  twice a day.   Continue pepcid 40mg  daily - sent in 90 day Rx to local pharmacy.

## 2019-08-18 ENCOUNTER — Ambulatory Visit: Payer: Medicare Other | Admitting: Podiatry

## 2019-08-24 DIAGNOSIS — E119 Type 2 diabetes mellitus without complications: Secondary | ICD-10-CM | POA: Diagnosis not present

## 2019-08-24 DIAGNOSIS — H401133 Primary open-angle glaucoma, bilateral, severe stage: Secondary | ICD-10-CM | POA: Diagnosis not present

## 2019-08-27 DIAGNOSIS — G4733 Obstructive sleep apnea (adult) (pediatric): Secondary | ICD-10-CM | POA: Diagnosis not present

## 2019-08-31 ENCOUNTER — Other Ambulatory Visit: Payer: Self-pay | Admitting: Family Medicine

## 2019-08-31 DIAGNOSIS — Z1231 Encounter for screening mammogram for malignant neoplasm of breast: Secondary | ICD-10-CM

## 2019-09-01 ENCOUNTER — Telehealth: Payer: Self-pay

## 2019-09-01 ENCOUNTER — Other Ambulatory Visit: Payer: Self-pay | Admitting: Family Medicine

## 2019-09-01 DIAGNOSIS — N644 Mastodynia: Secondary | ICD-10-CM

## 2019-09-01 DIAGNOSIS — N63 Unspecified lump in unspecified breast: Secondary | ICD-10-CM

## 2019-09-01 NOTE — Telephone Encounter (Signed)
Order changed as requested.

## 2019-09-01 NOTE — Telephone Encounter (Signed)
Pt calling nurse line to request that PCP place order for diagnostic mammogram. Pt states that the breast center called her and said that due to patient finding lumps in breast, that mammogram order needs to be changed from screening to diagnostic.  To PCP. Talbot Grumbling, RN

## 2019-09-04 ENCOUNTER — Other Ambulatory Visit: Payer: Self-pay | Admitting: Family Medicine

## 2019-09-04 DIAGNOSIS — E1149 Type 2 diabetes mellitus with other diabetic neurological complication: Secondary | ICD-10-CM

## 2019-09-17 ENCOUNTER — Other Ambulatory Visit: Payer: Self-pay | Admitting: Family Medicine

## 2019-09-17 ENCOUNTER — Ambulatory Visit
Admission: RE | Admit: 2019-09-17 | Discharge: 2019-09-17 | Disposition: A | Discharge status: Home / Self Care | Payer: Medicare Other | Source: Ambulatory Visit | Attending: Family Medicine | Admitting: Family Medicine

## 2019-09-17 ENCOUNTER — Other Ambulatory Visit: Payer: Self-pay

## 2019-09-17 ENCOUNTER — Ambulatory Visit: Payer: Medicare Other

## 2019-09-17 DIAGNOSIS — N644 Mastodynia: Secondary | ICD-10-CM

## 2019-09-17 DIAGNOSIS — R928 Other abnormal and inconclusive findings on diagnostic imaging of breast: Secondary | ICD-10-CM | POA: Diagnosis not present

## 2019-09-17 DIAGNOSIS — N63 Unspecified lump in unspecified breast: Secondary | ICD-10-CM

## 2019-09-17 DIAGNOSIS — N6320 Unspecified lump in the left breast, unspecified quadrant: Secondary | ICD-10-CM | POA: Diagnosis not present

## 2019-09-22 ENCOUNTER — Other Ambulatory Visit: Payer: Self-pay | Admitting: Family Medicine

## 2019-09-24 DIAGNOSIS — G4733 Obstructive sleep apnea (adult) (pediatric): Secondary | ICD-10-CM | POA: Diagnosis not present

## 2019-09-27 DIAGNOSIS — G4733 Obstructive sleep apnea (adult) (pediatric): Secondary | ICD-10-CM | POA: Diagnosis not present

## 2019-09-28 ENCOUNTER — Ambulatory Visit: Payer: Medicare Other | Attending: Internal Medicine

## 2019-09-28 DIAGNOSIS — Z20822 Contact with and (suspected) exposure to covid-19: Secondary | ICD-10-CM

## 2019-09-29 LAB — NOVEL CORONAVIRUS, NAA: SARS-CoV-2, NAA: NOT DETECTED

## 2019-10-01 IMAGING — CR DG SHOULDER 2+V*R*
3 series · 3 of 3 positions shown · non-contrast
Comparison: No prior.

CLINICAL DATA: Right shoulder pain.

EXAM:
RIGHT SHOULDER - 2+ VIEW

[shoulder grashey]
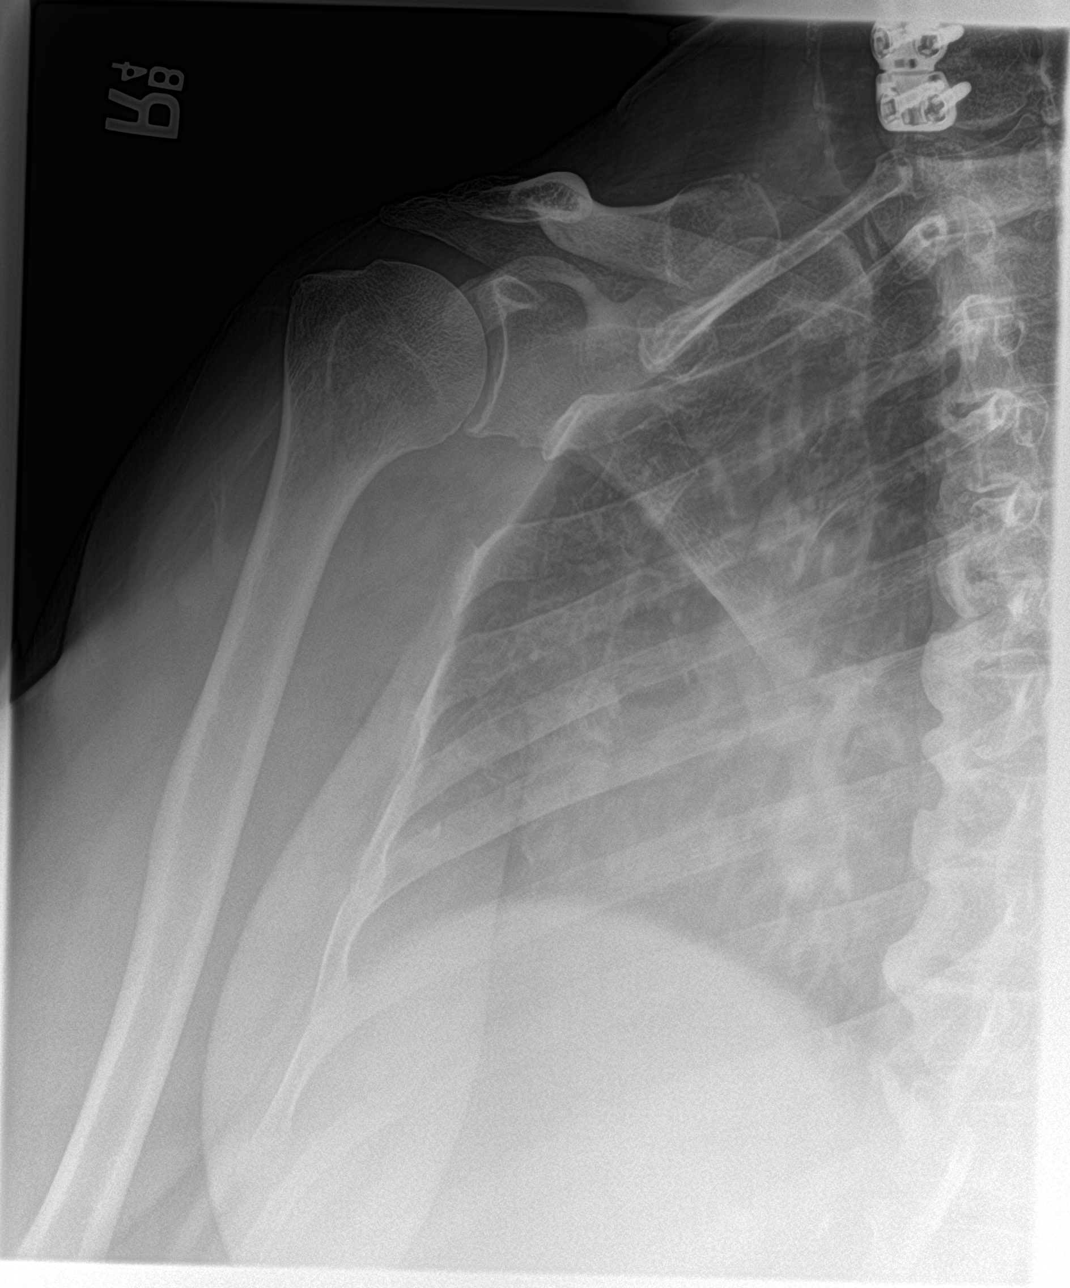

[shoulder y view]
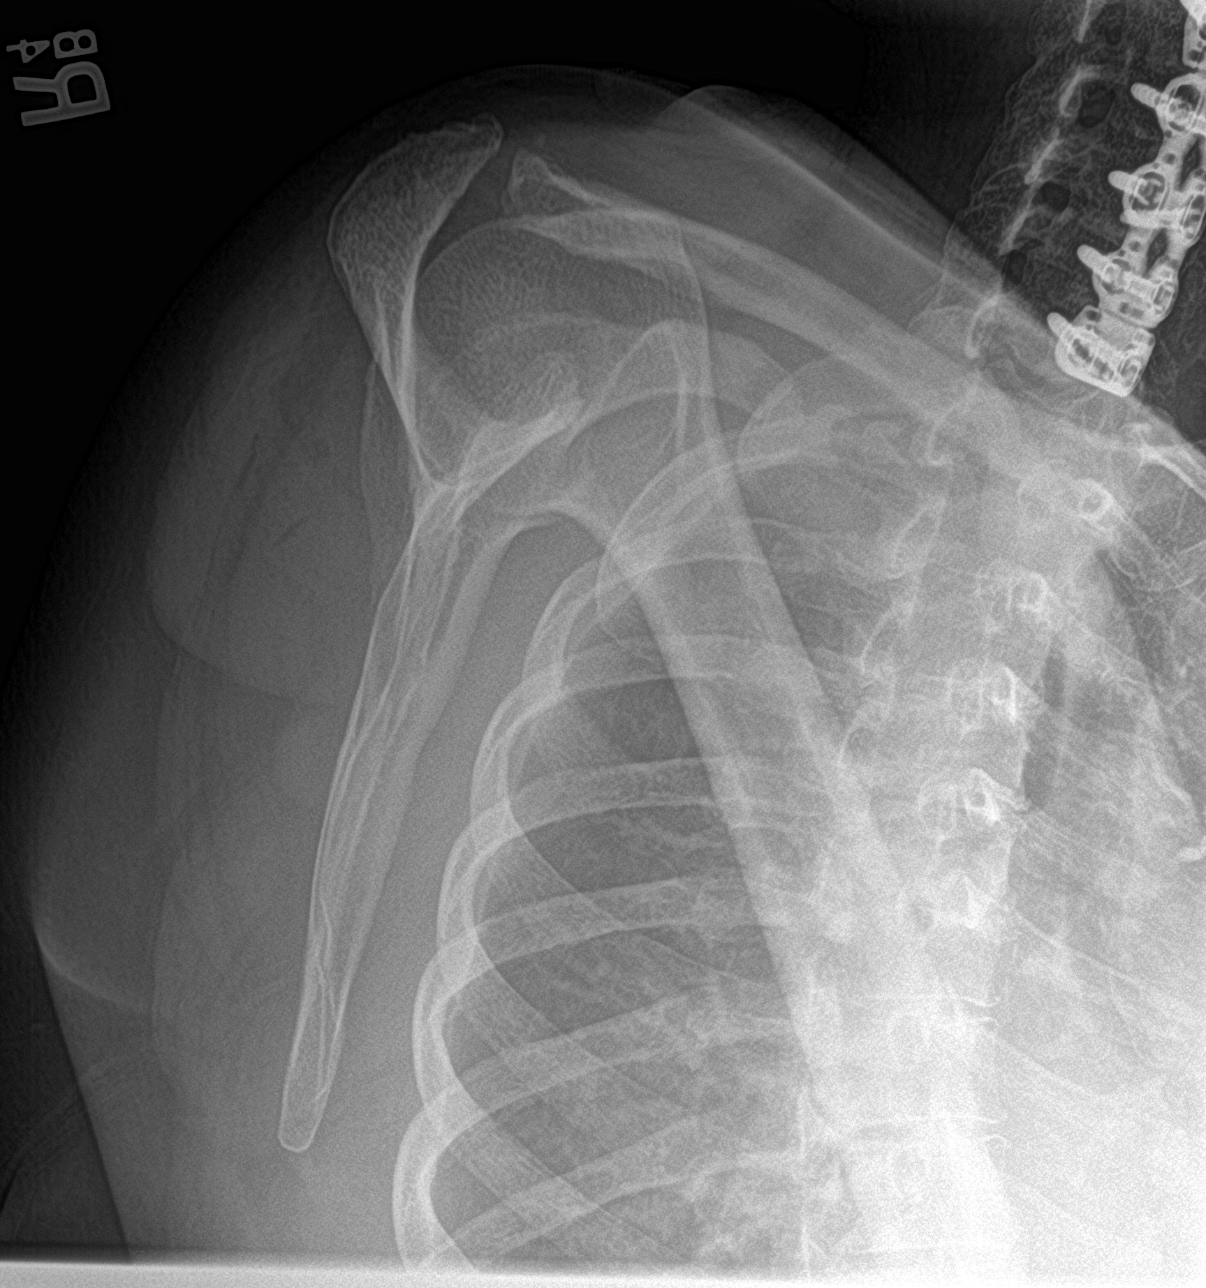

[shoulder axillary]
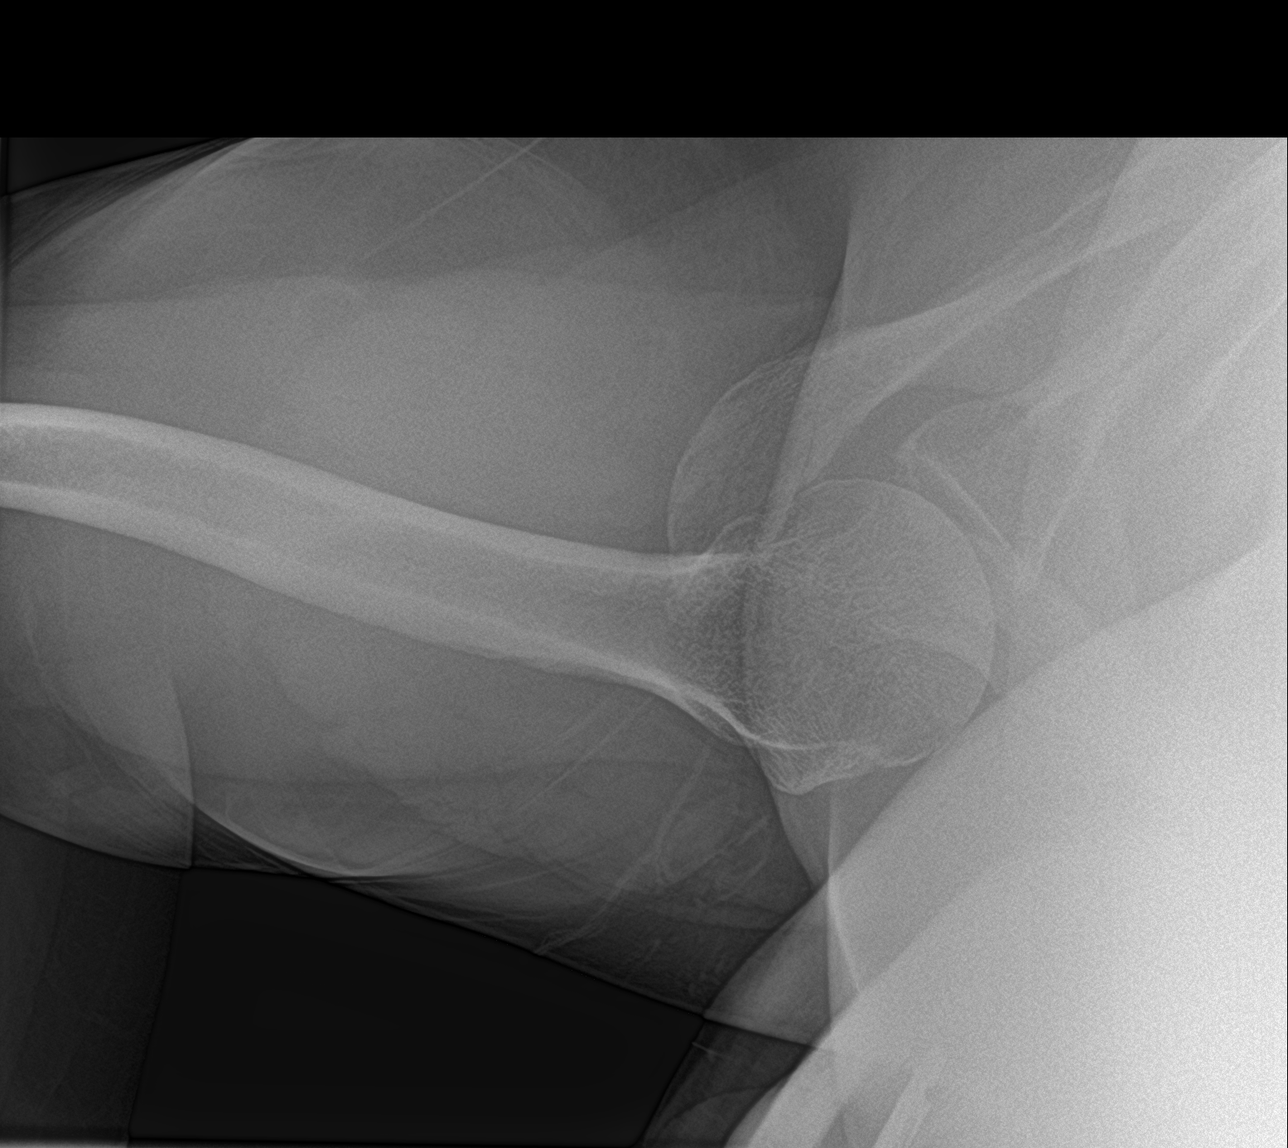

[3 of 3 positions shown; findings below may reference images not displayed]

FINDINGS: Diffuse osteopenia and degenerative change. No acute abnormality.
Cervical spine fusion is present. Degenerative changes thoracic
spine.
IMPRESSION: Diffuse osteopenia and degenerative change.

## 2019-10-07 ENCOUNTER — Ambulatory Visit: Payer: Medicare Other | Attending: Internal Medicine

## 2019-10-07 DIAGNOSIS — Z20822 Contact with and (suspected) exposure to covid-19: Secondary | ICD-10-CM | POA: Diagnosis not present

## 2019-10-08 ENCOUNTER — Telehealth: Payer: Self-pay

## 2019-10-08 LAB — NOVEL CORONAVIRUS, NAA: SARS-CoV-2, NAA: DETECTED — AB

## 2019-10-08 NOTE — Telephone Encounter (Signed)
Patient calls nurse line stating she just tested positive for Covid. Patient stated she feels ok, just has a cough every now and again. Patient does not need anything at this time, she just wanted to make provider aware. ED precautions given to patient for high fever or SOB, and informed her we have virtual apts if needed.

## 2019-10-09 ENCOUNTER — Telehealth: Payer: Self-pay | Admitting: Unknown Physician Specialty

## 2019-10-09 ENCOUNTER — Other Ambulatory Visit: Payer: Self-pay | Admitting: Unknown Physician Specialty

## 2019-10-09 DIAGNOSIS — U071 COVID-19: Secondary | ICD-10-CM

## 2019-10-09 DIAGNOSIS — I10 Essential (primary) hypertension: Secondary | ICD-10-CM

## 2019-10-09 DIAGNOSIS — J449 Chronic obstructive pulmonary disease, unspecified: Secondary | ICD-10-CM

## 2019-10-09 DIAGNOSIS — E1149 Type 2 diabetes mellitus with other diabetic neurological complication: Secondary | ICD-10-CM

## 2019-10-09 NOTE — Telephone Encounter (Signed)
Called to discuss with patient about Covid symptoms and the use of bamlanivimab, a monoclonal antibody infusion for those with mild to moderate Covid symptoms and at a high risk of hospitalization.  Pt is qualified for this infusion at the [facility] due to Age > 65   Message left to call back  

## 2019-10-09 NOTE — Telephone Encounter (Signed)
  I connected by phone with [name] [name] on 10/09/2019 at 10:58 AM to discuss the potential use of an new treatment for mild to moderate COVID-19 viral infection in non-hospitalized patients.  This patient is a 69 y.o. female that meets the FDA criteria for Emergency Use Authorization of bamlanivimab or casirivimab\imdevimab.  Has a (+) direct SARS-CoV-2 viral test result  Has mild or moderate COVID-19   Is ? 68 years of age and weighs ? 40 kg  Is NOT hospitalized due to COVID-19  Is NOT requiring oxygen therapy or requiring an increase in baseline oxygen flow rate due to COVID-19  Is within 10 days of symptom onset  Has at least one of the high risk factor(s) for progression to severe COVID-19 and/or hospitalization as defined in EUA.  Specific high risk criteria : >/= 68 yo   I have spoken and communicated the following to the patient or parent/caregiver:  1. FDA has authorized the emergency use of bamlanivimab and casirivimab\imdevimab for the treatment of mild to moderate COVID-19 in adults and pediatric patients with positive results of direct SARS-CoV-2 viral testing who are 79 years of age and older weighing at least 40 kg, and who are at high risk for progressing to severe COVID-19 and/or hospitalization.  2. The significant known and potential risks and benefits of bamlanivimab and casirivimab\imdevimab, and the extent to which such potential risks and benefits are unknown.  3. Information on available alternative treatments and the risks and benefits of those alternatives, including clinical trials.  4. Patients treated with bamlanivimab and casirivimab\imdevimab should continue to self-isolate and use infection control measures (e.g., wear mask, isolate, social distance, avoid sharing personal items, clean and disinfect "high touch" surfaces, and frequent handwashing) according to CDC guidelines.   5. The patient or parent/caregiver has the option to accept or  refuse bamlanivimab or casirivimab\imdevimab .  After reviewing this information with the patient, The patient agreed to proceed with receiving the bamlanimivab infusion and will be provided a copy of the Fact sheet prior to receiving the infusion.[name] 10/09/2019 10:58 AM  Sx onset 1/31

## 2019-10-11 ENCOUNTER — Ambulatory Visit (HOSPITAL_COMMUNITY): Payer: Medicare Other

## 2019-10-12 ENCOUNTER — Telehealth: Payer: Self-pay

## 2019-10-12 ENCOUNTER — Ambulatory Visit (HOSPITAL_COMMUNITY)
Admission: RE | Admit: 2019-10-12 | Discharge: 2019-10-12 | Disposition: A | Discharge status: Home / Self Care | Payer: Medicare Other | Source: Ambulatory Visit | Attending: Pulmonary Disease | Admitting: Pulmonary Disease

## 2019-10-12 DIAGNOSIS — U071 COVID-19: Secondary | ICD-10-CM | POA: Diagnosis present

## 2019-10-12 DIAGNOSIS — Z23 Encounter for immunization: Secondary | ICD-10-CM | POA: Insufficient documentation

## 2019-10-12 DIAGNOSIS — E1149 Type 2 diabetes mellitus with other diabetic neurological complication: Secondary | ICD-10-CM | POA: Diagnosis present

## 2019-10-12 DIAGNOSIS — J449 Chronic obstructive pulmonary disease, unspecified: Secondary | ICD-10-CM | POA: Insufficient documentation

## 2019-10-12 DIAGNOSIS — I10 Essential (primary) hypertension: Secondary | ICD-10-CM | POA: Diagnosis present

## 2019-10-12 MED ORDER — FAMOTIDINE IN NACL 20-0.9 MG/50ML-% IV SOLN: 20.0000 mg | Freq: Once | INTRAVENOUS | Status: DC | PRN

## 2019-10-12 MED ORDER — SODIUM CHLORIDE 0.9 % IV SOLN
INTRAVENOUS | Status: DC | PRN
  Administered 2019-10-12: 12:00:00 250 mL via INTRAVENOUS

## 2019-10-12 MED ORDER — METHYLPREDNISOLONE SODIUM SUCC 125 MG IJ SOLR: 125.0000 mg | Freq: Once | INTRAMUSCULAR | Status: DC | PRN

## 2019-10-12 MED ORDER — EPINEPHRINE 0.3 MG/0.3ML IJ SOAJ: 0.3000 mg | Freq: Once | INTRAMUSCULAR | Status: DC | PRN

## 2019-10-12 MED ORDER — DIPHENHYDRAMINE HCL 50 MG/ML IJ SOLN: 50.0000 mg | Freq: Once | INTRAMUSCULAR | Status: DC | PRN

## 2019-10-12 MED ORDER — SODIUM CHLORIDE 0.9 % IV SOLN
700.0000 mg | Freq: Once | INTRAVENOUS | Status: AC
  Administered 2019-10-12: 700 mg via INTRAVENOUS
  Filled 2019-10-12: qty 20

## 2019-10-12 MED ORDER — ALBUTEROL SULFATE HFA 108 (90 BASE) MCG/ACT IN AERS: 2.0000 | INHALATION_SPRAY | Freq: Once | RESPIRATORY_TRACT | Status: DC | PRN

## 2019-10-12 NOTE — Progress Notes (Signed)
  Diagnosis: COVID-19  Physician: Dr. Asencion Noble  Procedure: [facility] Med: bamlanivimab infusion - Provided patient with bamlanimivab fact sheet for patients, parents and caregivers prior to infusion.  Complications: No immediate complications noted.  Discharge: Discharged home   Gaye Alken 10/12/2019

## 2019-10-12 NOTE — Discharge Instructions (Signed)

## 2019-10-12 NOTE — Telephone Encounter (Signed)
Patient calls nurse line requesting cough syrup. Patient states that she received COVID infusion today and continues to have persistent cough. Requests medication be sent to [facility] on [address] [facility].   To PCP  Talbot Grumbling, RN

## 2019-10-13 MED ORDER — ACETAMINOPHEN-CODEINE 300-30 MG PO TABS: 1.0000 | ORAL_TABLET | Freq: Four times a day (QID) | ORAL | 0 refills | Status: DC | PRN

## 2019-10-13 NOTE — Telephone Encounter (Signed)
Dear [name] Team Please let her know I have called in some pills for cough. She had similar pills in 2016 and had no problem but they are realted to hydrocodone which causes mild itching in her so if she has any weird sx of itching, wheezing or rash, stop them. Tell her I hope she feels better! [name]

## 2019-10-13 NOTE — Telephone Encounter (Signed)
Called and informed pt. [name] [name], CMA

## 2019-10-20 ENCOUNTER — Ambulatory Visit: Payer: Medicare Other | Admitting: Family Medicine

## 2019-10-21 ENCOUNTER — Ambulatory Visit: Payer: Self-pay | Admitting: Internal Medicine

## 2019-10-22 ENCOUNTER — Other Ambulatory Visit (HOSPITAL_COMMUNITY): Payer: Self-pay | Admitting: Psychiatry

## 2019-10-22 DIAGNOSIS — F411 Generalized anxiety disorder: Secondary | ICD-10-CM

## 2019-10-22 DIAGNOSIS — F330 Major depressive disorder, recurrent, mild: Secondary | ICD-10-CM

## 2019-10-25 ENCOUNTER — Telehealth: Payer: Self-pay

## 2019-10-25 ENCOUNTER — Other Ambulatory Visit: Payer: Self-pay

## 2019-10-25 MED ORDER — FAMOTIDINE 40 MG PO TABS: 40.0000 mg | ORAL_TABLET | Freq: Every day | ORAL | 0 refills | Status: DC

## 2019-10-25 NOTE — Telephone Encounter (Signed)
Patient called nurse line regarding questions on when she can be retested for COVID. Patient received COVID infusion on Tuesday, 10/12/2019. Patient would like to receive follow up testing before ending quarantine period. Please advise earliest that patient can be re-tested.   To PCP  Talbot Grumbling, RN

## 2019-10-26 NOTE — Telephone Encounter (Signed)
Dear [name] [name] Team Unfortunately, the test wil be positive for up to 90days from her infection, so retesting does absolutely no good. Tell her I hope she is feeling better! [name] [name]

## 2019-10-26 NOTE — Telephone Encounter (Signed)
Contacted pt and gave her the below information and she asked how long she needed to stay in quarantine and I told her that she was nineteen days out from her positive test date so she should be fine to be out of quarantine.  She stated she still has a cough and wanted to know how long it could last.  I told her it could linger for a while and I told her that I would send PCP a message to see if she has any recommendations.  Also she said that she would like for Dr. Nori Riis to call her personally.[name] [name], CMA

## 2019-10-28 DIAGNOSIS — G4733 Obstructive sleep apnea (adult) (pediatric): Secondary | ICD-10-CM | POA: Diagnosis not present

## 2019-10-29 MED ORDER — ACETAMINOPHEN-CODEINE 300-30 MG PO TABS: 1.0000 | ORAL_TABLET | Freq: Four times a day (QID) | ORAL | 0 refills | Status: AC | PRN

## 2019-11-05 ENCOUNTER — Ambulatory Visit (INDEPENDENT_AMBULATORY_CARE_PROVIDER_SITE_OTHER): Payer: Medicare Other | Admitting: Podiatry

## 2019-11-05 ENCOUNTER — Other Ambulatory Visit: Payer: Self-pay

## 2019-11-05 VITALS — Temp 97.1°F

## 2019-11-05 DIAGNOSIS — L84 Corns and callosities: Secondary | ICD-10-CM

## 2019-11-05 DIAGNOSIS — E1142 Type 2 diabetes mellitus with diabetic polyneuropathy: Secondary | ICD-10-CM | POA: Diagnosis not present

## 2019-11-05 DIAGNOSIS — B351 Tinea unguium: Secondary | ICD-10-CM | POA: Diagnosis not present

## 2019-11-05 DIAGNOSIS — M7672 Peroneal tendinitis, left leg: Secondary | ICD-10-CM

## 2019-11-05 DIAGNOSIS — M79675 Pain in left toe(s): Secondary | ICD-10-CM | POA: Diagnosis not present

## 2019-11-05 DIAGNOSIS — M79674 Pain in right toe(s): Secondary | ICD-10-CM

## 2019-11-05 NOTE — Patient Instructions (Signed)
Diabetes Mellitus and Foot Care Foot care is an important part of your health, especially when you have diabetes. Diabetes may cause you to have problems because of poor blood flow (circulation) to your feet and legs, which can cause your skin to:  Become thinner and drier.  Break more easily.  Heal more slowly.  Peel and crack. You may also have nerve damage (neuropathy) in your legs and feet, causing decreased feeling in them. This means that you may not notice minor injuries to your feet that could lead to more serious problems. Noticing and addressing any potential problems early is the best way to prevent future foot problems. How to care for your feet Foot hygiene  Wash your feet daily with warm water and mild soap. Do not use hot water. Then, pat your feet and the areas between your toes until they are completely dry. Do not soak your feet as this can dry your skin.  Trim your toenails straight across. Do not dig under them or around the cuticle. File the edges of your nails with an emery board or nail file.  Apply a moisturizing lotion or petroleum jelly to the skin on your feet and to dry, brittle toenails. Use lotion that does not contain alcohol and is unscented. Do not apply lotion between your toes. Shoes and socks  Wear clean socks or stockings every day. Make sure they are not too tight. Do not wear knee-high stockings since they may decrease blood flow to your legs.  Wear shoes that fit properly and have enough cushioning. Always look in your shoes before you put them on to be sure there are no objects inside.  To break in new shoes, wear them for just a few hours a day. This prevents injuries on your feet. Wounds, scrapes, corns, and calluses  Check your feet daily for blisters, cuts, bruises, sores, and redness. If you cannot see the bottom of your feet, use a mirror or ask someone for help.  Do not cut corns or calluses or try to remove them with medicine.  If you  find a minor scrape, cut, or break in the skin on your feet, keep it and the skin around it clean and dry. You may clean these areas with mild soap and water. Do not clean the area with peroxide, alcohol, or iodine.  If you have a wound, scrape, corn, or callus on your foot, look at it several times a day to make sure it is healing and not infected. Check for: ? Redness, swelling, or pain. ? Fluid or blood. ? Warmth. ? Pus or a bad smell. General instructions  Do not cross your legs. This may decrease blood flow to your feet.  Do not use heating pads or hot water bottles on your feet. They may burn your skin. If you have lost feeling in your feet or legs, you may not know this is happening until it is too late.  Protect your feet from hot and cold by wearing shoes, such as at the beach or on hot pavement.  Schedule a complete foot exam at least once a year (annually) or more often if you have foot problems. If you have foot problems, report any cuts, sores, or bruises to your health care provider immediately. Contact a health care provider if:  You have a medical condition that increases your risk of infection and you have any cuts, sores, or bruises on your feet.  You have an injury that is not   healing.  You have redness on your legs or feet.  You feel burning or tingling in your legs or feet.  You have pain or cramps in your legs and feet.  Your legs or feet are numb.  Your feet always feel cold.  You have pain around a toenail. Get help right away if:  You have a wound, scrape, corn, or callus on your foot and: ? You have pain, swelling, or redness that gets worse. ? You have fluid or blood coming from the wound, scrape, corn, or callus. ? Your wound, scrape, corn, or callus feels warm to the touch. ? You have pus or a bad smell coming from the wound, scrape, corn, or callus. ? You have a fever. ? You have a red line going up your leg. Summary  Check your feet every day  for cuts, sores, red spots, swelling, and blisters.  Moisturize feet and legs daily.  Wear shoes that fit properly and have enough cushioning.  If you have foot problems, report any cuts, sores, or bruises to your health care provider immediately.  Schedule a complete foot exam at least once a year (annually) or more often if you have foot problems. This information is not intended to replace advice given to you by your health care provider. Make sure you discuss any questions you have with your health care provider. Document Revised: 05/12/2019 Document Reviewed: 09/20/2016 Elsevier Patient Education  2020 [employer]  Peripheral Neuropathy Peripheral neuropathy is a type of nerve damage. It affects nerves that carry signals between the spinal cord and the arms, legs, and the rest of the body (peripheral nerves). It does not affect nerves in the spinal cord or brain. In peripheral neuropathy, one nerve or a group of nerves may be damaged. Peripheral neuropathy is a broad category that includes many specific nerve disorders, like diabetic neuropathy, hereditary neuropathy, and carpal tunnel syndrome. What are the causes? This condition may be caused by:  Diabetes. This is the most common cause of peripheral neuropathy.  Nerve injury.  Pressure or stress on a nerve that lasts a long time.  Lack (deficiency) of B vitamins. This can result from alcoholism, poor diet, or a restricted diet.  Infections.  Autoimmune diseases, such as rheumatoid arthritis and systemic lupus erythematosus.  Nerve diseases that are passed from parent to child (inherited).  Some medicines, such as cancer medicines (chemotherapy).  Poisonous (toxic) substances, such as lead and mercury.  Too little blood flowing to the legs.  Kidney disease.  Thyroid disease. In some cases, the cause of this condition is not known. What are the signs or symptoms? Symptoms of this condition depend on which of your  nerves is damaged. Common symptoms include:  Loss of feeling (numbness) in the feet, hands, or both.  Tingling in the feet, hands, or both.  Burning pain.  Very sensitive skin.  Weakness.  Not being able to move a part of the body (paralysis).  Muscle twitching.  Clumsiness or poor coordination.  Loss of balance.  Not being able to control your bladder.  Feeling dizzy.  Sexual problems. How is this diagnosed? Diagnosing and finding the cause of peripheral neuropathy can be difficult. Your health care provider will take your medical history and do a physical exam. A neurological exam will also be done. This involves checking things that are affected by your brain, spinal cord, and nerves (nervous system). For example, your health care provider will check your reflexes, how you move, and   what you can feel. You may have other tests, such as:  Blood tests.  Electromyogram (EMG) and nerve conduction tests. These tests check nerve function and how well the nerves are controlling the muscles.  Imaging tests, such as CT scans or MRI to rule out other causes of your symptoms.  Removing a small piece of nerve to be examined in a lab (nerve biopsy). This is rare.  Removing and examining a small amount of the fluid that surrounds the brain and spinal cord (lumbar puncture). This is rare. How is this treated? Treatment for this condition may involve:  Treating the underlying cause of the neuropathy, such as diabetes, kidney disease, or vitamin deficiencies.  Stopping medicines that can cause neuropathy, such as chemotherapy.  Medicine to relieve pain. Medicines may include: ? Prescription or over-the-counter pain medicine. ? Antiseizure medicine. ? Antidepressants. ? Pain-relieving patches that are applied to painful areas of skin.  Surgery to relieve pressure on a nerve or to destroy a nerve that is causing pain.  Physical therapy to help improve movement and  balance.  Devices to help you move around (assistive devices). Follow these instructions at home: Medicines  Take over-the-counter and prescription medicines only as told by your health care provider. Do not take any other medicines without first asking your health care provider.  Do not drive or use heavy machinery while taking prescription pain medicine. Lifestyle   Do not use any products that contain nicotine or tobacco, such as cigarettes and e-cigarettes. Smoking keeps blood from reaching damaged nerves. If you need help quitting, ask your health care provider.  Avoid or limit alcohol. Too much alcohol can cause a vitamin B deficiency, and vitamin B is needed for healthy nerves.  Eat a healthy diet. This includes: ? Eating foods that are high in fiber, such as fresh fruits and vegetables, whole grains, and beans. ? Limiting foods that are high in fat and processed sugars, such as fried or sweet foods. General instructions   If you have diabetes, work closely with your health care provider to keep your blood sugar under control.  If you have numbness in your feet: ? Check every day for signs of injury or infection. Watch for redness, warmth, and swelling. ? Wear padded socks and comfortable shoes. These help protect your feet.  Develop a good support system. Living with peripheral neuropathy can be stressful. Consider talking with a mental health specialist or joining a support group.  Use assistive devices and attend physical therapy as told by your health care provider. This may include using a walker or a cane.  Keep all follow-up visits as told by your health care provider. This is important. Contact a health care provider if:  You have new signs or symptoms of peripheral neuropathy.  You are struggling emotionally from dealing with peripheral neuropathy.  Your pain is not well-controlled. Get help right away if:  You have an injury or infection that is not healing  normally.  You develop new weakness in an arm or leg.  You fall frequently. Summary  Peripheral neuropathy is when the nerves in the arms, or legs are damaged, resulting in numbness, weakness, or pain.  There are many causes of peripheral neuropathy, including diabetes, pinched nerves, vitamin deficiencies, autoimmune disease, and hereditary conditions.  Diagnosing and finding the cause of peripheral neuropathy can be difficult. Your health care provider will take your medical history, do a physical exam, and do tests, including blood tests and nerve function tests.    Treatment involves treating the underlying cause of the neuropathy and taking medicines to help control pain. Physical therapy and assistive devices may also help. This information is not intended to replace advice given to you by your health care provider. Make sure you discuss any questions you have with your health care provider. Document Revised: 08/01/2017 Document Reviewed: 10/28/2016 Elsevier Patient Education  2020 Elsevier Inc.  

## 2019-11-10 ENCOUNTER — Encounter: Payer: Self-pay | Admitting: Podiatry

## 2019-11-10 NOTE — Progress Notes (Signed)
Subjective: [name] [name] presents today for follow up of preventative diabetic foot care and callus(es) plantar aspect of both feet and painful mycotic toenails b/l that are difficult to trim. Pain interferes with ambulation. Aggravating factors include wearing enclosed shoe gear. Pain is relieved with periodic professional debridement.   She relates discomfort of outside of left foot when walking. Denies any recent falls/trauma. Pain only present in the morning or after periods of rest. Denies any swelling or redness, but states it is sore sometimes.  Allergies  Allergen Reactions  . Bee Venom Hives and Swelling  . Propoxyphene Hcl Itching  . Amlodipine Besylate Swelling  . Hydrocodone Other (See Comments)    With Vicodin - makes patient "jittery", and "hangover effect - sleepy next day"  . Lisinopril Cough    Changed to ARB  . Metformin And Related Diarrhea    GI distress  . Metronidazole Other (See Comments)    REACTION: "just didn't work; my body never did heal from it"  . Valsartan Other (See Comments)    REACTION:  "sleep more the next day after I took it; it made me real tired"  . Xyzal [Levocetirizine Dihydrochloride] Rash     Objective: Vitals:   11/05/19 1021  Temp: (!) 97.1 F (36.2 C)    Pt 69 y.o. year old female  in NAD. AAO x 3.   Vascular Examination:  Capillary refill time to digits immediate b/l. Palpable DP pulses b/l. Palpable PT pulses b/l. Pedal hair absent b/l Skin temperature gradient within normal limits b/l.  Dermatological Examination: Pedal skin with normal turgor, texture and tone bilaterally. No open wounds bilaterally. Toenails 1-5 b/l elongated, dystrophic, thickened, crumbly with subungual debris and tenderness to dorsal palpation. Hyperkeratotic lesion(s) submet head 4 left foot, submet head 1 right foot and bilateral heels.  No erythema, no edema, no drainage, no flocculence.  Musculoskeletal: Normal muscle strength 5/5 to all lower  extremity muscle groups bilaterally, no pain crepitus or joint limitation noted with ROM b/l, hammertoes noted to the  2-5 bilaterally, pes planus deformity noted and pain at insertion of PB tendon left foot. No edema, no erythema, no warmth.  Neurological: Protective sensation intact 5/5 intact bilaterally with 10g monofilament b/l  Assessment: 1. Pain due to onychomycosis of toenails of both feet   2. Callus   3. Tendinitis of left peroneus brevis tendon   4. Diabetic peripheral neuropathy associated with type 2 diabetes mellitus (HCC)    Plan: -Toenails 1-5 b/l were debrided in length and girth with sterile nail nippers and dremel without iatrogenic bleeding.  -Patient to continue soft, supportive shoe gear daily. -Patient to report any pedal injuries to medical professional immediately. Applied arch pad in left shoe insert to prevent eversion and pulling of PB tendon. She was advised if this causes more pain, she may remove it. Call if symptoms worsen. Otherwise, we will reasses at next visit. -Patient/POA to call should there be question/concern in the interim.  Return in about 3 months (around 02/05/2020) for diabetic nail and callus trim.

## 2019-11-11 NOTE — Progress Notes (Signed)
Cardiology Office Note   Date:  11/12/2019   ID:  [name] [name], DOB Jan 19, 1951, MRN [id number]  PCP:  Dickie La, MD  Cardiologist:   Dorris Carnes, MD   Pt presents for follow up of HTN    History of Present Illness: [name] [name] is a 69 y.o. female with a history of HTN and HL and aortic sclerosis  I saw the pt in 2019   She was seen by Buren Kos in Feb 2020  Complained of palpitaitons   Set up for 14 day monitor   This showed a short burst of PAT   No afib   Recomm changing timing of metoprolol  Since seen the pt denies CP   She says her breathing is stable    She deniesp palpitations    Current Meds  Medication Sig  . ACCU-CHEK FASTCLIX LANCETS MISC TEST THREE TIMES DAILY  . acyclovir (ZOVIRAX) 200 MG capsule TAKE 1 CAPSULE BY MOUTH TWO TIMES DAILY  . albuterol (VENTOLIN HFA) 108 (90 Base) MCG/ACT inhaler USE 2 PUFFS BY MOUTH INTO  THE LUNGS EVERY 4 HOURS AS  NEEDED FOR WHEEZING OR  SHORTNESS OF BREATH  . Alcohol Swabs (B-D SINGLE USE SWABS REGULAR) PADS USE THREE TIMES DAILY  . allopurinol (ZYLOPRIM) 300 MG tablet TAKE 1 TABLET BY MOUTH  DAILY  . ammonium lactate (LAC-HYDRIN) 12 % lotion APPLY 1 APPLICATION TOPICALLY TWICE DAILY AS NEEDED FOR DRY SKIN (SUBSTITUTED FOR LAC HYDRIN)  . aspirin EC 81 MG tablet Take 81 mg by mouth daily.  [name] atorvastatin (LIPITOR) 40 MG tablet TAKE 1 TABLET BY MOUTH  EVERY DAY  . azelastine (ASTELIN) 0.1 % nasal spray USE 2 SPRAYS IN EACH NOSTRIL EVERY DAY AS NEEDED AS DIRECTED  . B-D ULTRAFINE III SHORT PEN 31G X 8 MM MISC USE TO INJECT THREE TIMES DAILY  . bisacodyl (DULCOLAX) 10 MG suppository Place 1 suppository (10 mg total) rectally as needed for moderate constipation.  . budesonide-formoterol (SYMBICORT) 160-4.5 MCG/ACT inhaler INHALE 2 PUFFS INTO THE LUNGS 2 (TWO) TIMES DAILY. RINSE, GARGLE, AND SPIT AFTER USE.  [name] buPROPion (WELLBUTRIN) 100 MG tablet TAKE 1 TABLET BY MOUTH  TWICE DAILY  . COMBIGAN 0.2-0.5 % ophthalmic  solution INSTILL 1 DROP INTO THE  LEFT EYE EVERY 12 HOURS  . dextromethorphan-guaiFENesin (ROBITUSSIN-DM) 10-100 MG/5ML liquid Take by mouth.  . EPINEPHrine 0.3 mg/0.3 mL IJ SOAJ injection Inject 0.3 mLs (0.3 mg total) into the muscle once as needed (for allergic reaction).  [name] esomeprazole (NEXIUM) 40 MG capsule TAKE 1 CAPSULE EVERY DAY  . ezetimibe (ZETIA) 10 MG tablet Take 1 tablet (10 mg total) by mouth daily.  . famotidine (PEPCID) 40 MG tablet Take 1 tablet (40 mg total) by mouth daily.  . fluticasone (FLONASE) 50 MCG/ACT nasal spray USE 2 SPRAYS IN EACH  NOSTRIL DAILY  . furosemide (LASIX) 40 MG tablet Take one tablet by mouth daily  . glucose blood (PRODIGY NO CODING BLOOD GLUC) test strip USE AS INSTRUCTED TO CHECK BLOOD SUGAR THREE TIMES A DAY  . HUMALOG KWIKPEN 100 UNIT/ML KwikPen SUBCUTANEOUSLY INJECT 35  UNITS 2 TIMES DAILY AFTER  MEALS AS DIRECTED  . Insulin Glargine (LANTUS SOLOSTAR) 100 UNIT/ML Solostar Pen INJECT SUBCUTANEOUSLY 35  UNITS TWO TIMES DAILY  . Insulin Syringe-Needle U-100 (BD INSULIN SYRINGE ULTRAFINE) 31G X 15/64" 0.5 ML MISC Use to inject three times a day  . latanoprost (XALATAN) 0.005 % ophthalmic solution Place 1 drop into both eyes  at bedtime.  [name] levocetirizine (XYZAL) 5 MG tablet Take 1 tablet (5 mg total) by mouth every evening. As needed for the hives/rash.  [name] LINZESS 290 MCG CAPS capsule Take 290 mcg by mouth daily as needed.   [name] losartan (COZAAR) 50 MG tablet Take 2 tablets (100 mg total) by mouth daily.  . metoCLOPramide (REGLAN) 5 MG tablet TAKE 1 TABLET BY MOUTH 4  TIMES DAILY  . metoprolol succinate (TOPROL-XL) 50 MG 24 hr tablet TAKE 3 TABLETS BY MOUTH  EVERY DAY WITH OR  IMMEDIATELY FOLLOWING A  MEAL  . montelukast (SINGULAIR) 10 MG tablet TAKE 1 TABLET BY MOUTH  DAILY  . Multiple Vitamins-Minerals (MULTIVITAMIN PO) Take 1 tablet by mouth daily.  . RESTASIS MULTIDOSE 0.05 % ophthalmic emulsion Place 1 drop into both eyes daily as needed.  .  sertraline (ZOLOFT) 100 MG tablet TAKE 1 TABLET BY MOUTH  DAILY  . spironolactone (ALDACTONE) 25 MG tablet TAKE 1 TABLET BY MOUTH  EVERY DAY BEFORE SUPPER  . traMADol (ULTRAM) 50 MG tablet TAKE 1-2 TABLETS BY MOUTH EVERY 8 HOURS AS NEEDED FOR PAIN. MAX 6 TABLETS PER 24 HOURS  . TRULICITY 1.5 0000000 SOPN INJECT SUBCUTANEOUSLY 1.5  MG EVERY WEEK     Allergies:   Bee venom, Propoxyphene hcl, Amlodipine besylate, Hydrocodone, Lisinopril, Metformin and related, Metronidazole, Valsartan, and Xyzal [levocetirizine dihydrochloride]   Past Medical History:  Diagnosis Date  . Allergy   . Angina   . Anxiety   . Arthritis   . Asthma   . Blood transfusion   . Blood transfusion without reported diagnosis   . Cataract   . Chronic cough    Now followed by pulmonary  . Chronic lower back pain   . Complication of anesthesia    "just wake up coughing; that's all"  . Concussion 11/18/11   "fell at dr's office; hit head"  . COPD (chronic obstructive pulmonary disease) ([place])   . Delayed gastric emptying   . Depression   . Diabetes mellitus type II    "take insulin & pills"  . Dyslipidemia   . Exertional dyspnea   . GERD (gastroesophageal reflux disease)   . Glaucoma   . Gout   . Headache(784.0) 11/18/11   "I've had mild headaches the last couple days"  . Headache(784.0) 01/08/12   "pretty constant since 11/18/11's concussion"  . Heart murmur   . History of colonoscopy   . HTN (hypertension)   . Myocardial infarction ([place])   . Osteoporosis   . Peripheral neuropathy   . Pneumonia    "i've had it once" (11/18/11)  . Sleep apnea    on CPAP 12  . Syncope 06/16/2012  . Vocal cord paralysis, unilateral partial    "right"    Past Surgical History:  Procedure Laterality Date  . ANTERIOR CERVICAL DECOMP/DISCECTOMY FUSION  01/15/2012   Procedure: ANTERIOR CERVICAL DECOMPRESSION/DISCECTOMY FUSION 3 LEVELS;  Surgeon: Eustace Moore, MD;  Location: [place] [facility];  Service: Neurosurgery;  Laterality:  N/A;  Anterior Cervical Decompression Discectomy Fusion Cerivcal three four, cervical four five, cervical five six, cervical six seven  . BREAST BIOPSY     bilaterally  . BREAST CYST EXCISION     right twice; left once  . BREAST REDUCTION SURGERY  04-21-2003  . BRONCHOSCOPY  07-2007  . CARDIAC CATHETERIZATION  03-02-2004  . CARPAL TUNNEL RELEASE     left  . CATARACT EXTRACTION  1955; 1979   bilateral; right eye  . CESAREAN  SECTION  1987  . COLONOSCOPY W/ BIOPSIES  11/21/2010   adenoma polyp--no hi grade dysplasia Dr Collene Mares  . EYE SURGERY    . KNEE ARTHROSCOPY  08/2000   right  . LEFT AND RIGHT HEART CATHETERIZATION WITH CORONARY ANGIOGRAM N/A 01/19/2014   Procedure: LEFT AND RIGHT HEART CATHETERIZATION WITH CORONARY ANGIOGRAM;  Surgeon: Sinclair Grooms, MD;  Location: [facility] CATH [facility];  Service: Cardiovascular;  Laterality: N/A;  . REDUCTION MAMMAPLASTY Bilateral   . SHOULDER ARTHROSCOPY  08/2000   right  . SHOULDER ARTHROSCOPY W/ ROTATOR CUFF REPAIR  10/18/2003   left  . SPINE SURGERY    . T-score  09/02/04   -0.54 (low risk currently)  . TOTAL ABDOMINAL HYSTERECTOMY  10-07-2000  . TREATMENT FISTULA ANAL    . TUBAL LIGATION  1987     Social History:  The patient  reports that she quit smoking about 44 years ago. Her smoking use included cigarettes. She has a 0.25 pack-year smoking history. She has never used smokeless tobacco. She reports that she does not drink alcohol or use drugs.   Family History:  The patient's family history includes Cancer in her sister; Congestive Heart Failure in her brother; Diabetes in her father; Heart disease in her mother; Hyperlipidemia in her brother and sister; Hypertension in her brother, sister, and sister.    ROS:  Please see the history of present illness. All other systems are reviewed and  Negative to the above problem except as noted.    PHYSICAL EXAM: VS:  BP 124/70   Pulse 78   Ht 5\' 1"  (1.549 m)   Wt 190 lb (86.2 kg)   BMI 35.90 kg/m    GEN: Morbidly obese 69 yo , in no acute distress  Neck: no JVD Cardiac: RRR; Gr II/VI systolic murmur base    Trivial LE edema   Respiratory:  clear to auscultation bilaterally, GI: soft, nontender, obese   MS: no deformity Moving all extremities   Skin: warm and dry, no rash  Bug noted on neck (bedbug) Neuro:  Strength and sensation are intact Psych: euthymic mood, full affect   EKG:  EKG is  ordered today.    SR 78    Lipid Panel    Component Value Date/Time   CHOL 121 11/27/2016 1024   TRIG 99 11/27/2016 1024   HDL 28 (L) 11/27/2016 1024   CHOLHDL 4.3 11/27/2016 1024   CHOLHDL 3.7 10/20/2015 1212   VLDL 16 10/20/2015 1212   LDLCALC 73 11/27/2016 1024   LDLDIRECT 65 07/14/2019 1352   LDLDIRECT 104 09/11/2016 1152      Wt Readings from Last 3 Encounters:  11/12/19 190 lb (86.2 kg)  07/14/19 198 lb 6.4 oz (90 kg)  04/28/19 193 lb (87.5 kg)      ASSESSMENT AND PLAN: 1  HTN  BP is controlled     2  HL   Will check lipid panel today    3  Aortic sclerosis  Follow    4  Minimal CAD   No symptoms of CP    5  Dyspnea Breathing is stable  FLuid appears OK  Will check electrolytes       Current medicines are reviewed at length with the patient today.  The patient does not have concerns regarding medicines.  Signed, Dorris Carnes, MD  11/12/2019 12:17 PM    [facility] [place], [place],   [address] Phone: [phone]; Fax: [phone]

## 2019-11-12 ENCOUNTER — Encounter: Payer: Self-pay | Admitting: Internal Medicine

## 2019-11-12 ENCOUNTER — Ambulatory Visit (INDEPENDENT_AMBULATORY_CARE_PROVIDER_SITE_OTHER): Payer: Medicare Other | Admitting: Internal Medicine

## 2019-11-12 ENCOUNTER — Other Ambulatory Visit: Payer: Self-pay

## 2019-11-12 VITALS — BP 124/70 | HR 78 | Ht 61.0 in | Wt 190.0 lb

## 2019-11-12 DIAGNOSIS — R002 Palpitations: Secondary | ICD-10-CM

## 2019-11-12 DIAGNOSIS — E785 Hyperlipidemia, unspecified: Secondary | ICD-10-CM | POA: Diagnosis not present

## 2019-11-12 NOTE — Patient Instructions (Signed)
Medication Instructions:  No changes *If you need a refill on your cardiac medications before your next appointment, please call your pharmacy*   Lab Work: Today: cbc, bmet, lipids If you have labs (blood work) drawn today and your tests are completely normal, you will receive your results only by: [name] MyChart Message (if you have MyChart) OR . A paper copy in the mail If you have any lab test that is abnormal or we need to change your treatment, we will call you to review the results.   Testing/Procedures: none   Follow-Up: At [facility], you and your health needs are our priority.  As part of our continuing mission to provide you with exceptional heart care, we have created designated Provider Care Teams.  These Care Teams include your primary Cardiologist (physician) and Advanced Practice Providers (APPs -  Physician Assistants and Nurse Practitioners) who all work together to provide you with the care you need, when you need it.  We recommend signing up for the patient portal called "MyChart".  Sign up information is provided on this After Visit Summary.  MyChart is used to connect with patients for Virtual Visits (Telemedicine).  Patients are able to view lab/test results, encounter notes, upcoming appointments, etc.  Non-urgent messages can be sent to your provider as well.   To learn more about what you can do with MyChart, go to [email or web address].    Your next appointment:   8 month(s)  The format for your next appointment:   Either In Person or Virtual  Provider:   You may see Dorris Carnes, MD or one of the following Advanced Practice Providers on your designated Care Team:    Richardson Dopp, PA-C  [place], [place]  Daune Perch, NP    Other Instructions

## 2019-11-13 LAB — CBC
Hematocrit: 38.2 % (ref 34.0–46.6)
Hemoglobin: 12.2 g/dL (ref 11.1–15.9)
MCH: 28.2 pg (ref 26.6–33.0)
MCHC: 31.9 g/dL (ref 31.5–35.7)
MCV: 88 fL (ref 79–97)
Platelets: 187 10*3/uL (ref 150–450)
RBC: 4.33 x10E6/uL (ref 3.77–5.28)
RDW: 13.7 % (ref 11.7–15.4)
WBC: 7.7 10*3/uL (ref 3.4–10.8)

## 2019-11-13 LAB — LIPID PANEL
Chol/HDL Ratio: 3.3 ratio (ref 0.0–4.4)
Cholesterol, Total: 118 mg/dL (ref 100–199)
HDL: 36 mg/dL — ABNORMAL LOW (ref >39)
LDL Chol Calc (NIH): 63 mg/dL (ref 0–99)
Triglycerides: 103 mg/dL (ref 0–149)
VLDL Cholesterol Cal: 19 mg/dL (ref 5–40)

## 2019-11-13 LAB — BASIC METABOLIC PANEL
BUN/Creatinine Ratio: 14 (ref 12–28)
BUN: 11 mg/dL (ref 8–27)
CO2: 25 mmol/L (ref 20–29)
Calcium: 9.4 mg/dL (ref 8.7–10.3)
Chloride: 106 mmol/L (ref 96–106)
Creatinine, Ser: 0.79 mg/dL (ref 0.57–1.00)
GFR calc Af Amer: 90 mL/min/{1.73_m2} (ref >59)
GFR calc non Af Amer: 78 mL/min/{1.73_m2} (ref >59)
Glucose: 122 mg/dL — ABNORMAL HIGH (ref 65–99)
Potassium: 4.1 mmol/L (ref 3.5–5.2)
Sodium: 145 mmol/L — ABNORMAL HIGH (ref 134–144)

## 2019-11-15 ENCOUNTER — Encounter: Payer: Self-pay | Admitting: Internal Medicine

## 2019-11-16 ENCOUNTER — Telehealth: Payer: Self-pay | Admitting: Internal Medicine

## 2019-11-16 NOTE — Telephone Encounter (Signed)
Returned call to pt and went over lab results with her. She verbalized understanding.

## 2019-11-16 NOTE — Telephone Encounter (Signed)
The patient called and wanted to speak with [name] [name] about her lab results. There were some instructions that the nurse left that she would also like to clarify. She has an appointment today and tomorrow and will be gone from 11-2. Please call her back after 2:00 pm

## 2019-11-17 ENCOUNTER — Ambulatory Visit (INDEPENDENT_AMBULATORY_CARE_PROVIDER_SITE_OTHER): Payer: Medicare Other | Admitting: Family Medicine

## 2019-11-17 ENCOUNTER — Encounter: Payer: Self-pay | Admitting: Family Medicine

## 2019-11-17 ENCOUNTER — Other Ambulatory Visit: Payer: Self-pay

## 2019-11-17 VITALS — BP 140/78 | HR 75 | Ht 61.0 in | Wt 192.8 lb

## 2019-11-17 DIAGNOSIS — I10 Essential (primary) hypertension: Secondary | ICD-10-CM

## 2019-11-17 DIAGNOSIS — G8929 Other chronic pain: Secondary | ICD-10-CM | POA: Diagnosis not present

## 2019-11-17 DIAGNOSIS — M545 Low back pain, unspecified: Secondary | ICD-10-CM

## 2019-11-17 DIAGNOSIS — E1149 Type 2 diabetes mellitus with other diabetic neurological complication: Secondary | ICD-10-CM

## 2019-11-17 LAB — POCT GLYCOSYLATED HEMOGLOBIN (HGB A1C): HbA1c, POC (controlled diabetic range): 6.3 % (ref 0.0–7.0)

## 2019-11-17 NOTE — Assessment & Plan Note (Signed)
I think this is probably osteoarthritis.  She has known hip arthritis and I suspect that some of this is functional from gait change in forward posture due to her use of a walker.  We will start with some x-rays of the back and then I will get in touch with her and will go from there.  Depending on the x-ray results, she might benefit from some facet joint injections.

## 2019-11-17 NOTE — Addendum Note (Signed)
Addended by: Patterson Hammersmith A on: 11/17/2019 09:29 AM   Modules accepted: Orders

## 2019-11-17 NOTE — Patient Instructions (Signed)
I have put in the order for your back x rays! Great to see you!

## 2019-11-17 NOTE — Assessment & Plan Note (Signed)
Check A1c today and she is doing quite well.  Follow-up 4 months.  Continue current medications.

## 2019-11-17 NOTE — Progress Notes (Signed)
    CHIEF COMPLAINT / HPI: #1.  Follow-up diabetes mellitus: No episodes of low blood sugar.  She is taking her medicines regularly without problem.  Does admit to some very occasional dietary indiscretion but otherwise is trying to follow a healthy diet. 2.:  Low back pain: Continues to have low back pain mostly on the left side that radiates into the left hip.  Is becoming more problematic for her.  Sometimes she is having trouble sleeping at night because of it.  Every once in a while she will take a tramadol which helps some but she does not like taking that.  Would like to get some further evaluation.  She would consider trying corticosteroid injections if it would help her back.  States she did 1 of those many many years ago and cannot recall if it helped or not. #3.  Hypertension: Taking medications without problem.  No episodes of chest pain.  No shortness of breath.  Recently saw her cardiologist and was given a "clean bill of health".   PERTINENT  PMH / PSH: I have reviewed the patient's medications, allergies, past medical and surgical history, smoking status and updated in the EMR as appropriate.   OBJECTIVE:  BP 140/78   Pulse 75   Ht 5\' 1"  (1.549 m)   Wt 192 lb 12.8 oz (87.5 kg)   SpO2 99%   BMI 36.43 kg/m   Vital signs reviewed. GENERAL: Well-developed, well-nourished, no acute distress. CARDIOVASCULAR: Regular rate and rhythm  LUNGS: No increased work of breathing ABDOMEN: Soft  NEURO: Blind with some resting nystagmus. MSK: Movement of extremity x 4.  She can rise from a chair with a little bit of assistance from her walker.  Stooped posture with shortened gait but fluid movement. BACK: Area where she points to indicating the area of pain is in the left lumbar area and into the left SI joint.  I feel no deformity..  She does have some muscle spasm. \  ASSESSMENT / PLAN:   Chronic low back pain without sciatica I think this is probably osteoarthritis.  She has known  hip arthritis and I suspect that some of this is functional from gait change in forward posture due to her use of a walker.  We will start with some x-rays of the back and then I will get in touch with her and will go from there.  Depending on the x-ray results, she might benefit from some facet joint injections.  Diabetes mellitus type 2 with neurological manifestations ([place]) Check A1c today and she is doing quite well.  Follow-up 4 months.  Continue current medications.  HYPERTENSION, BENIGN SYSTEMIC Excellent blood pressure control.  Continue current medications.   [name] MD

## 2019-11-17 NOTE — Assessment & Plan Note (Signed)
Excellent blood pressure control.  Continue current medications.

## 2019-11-18 ENCOUNTER — Encounter: Payer: Self-pay | Admitting: Internal Medicine

## 2019-11-18 ENCOUNTER — Ambulatory Visit (INDEPENDENT_AMBULATORY_CARE_PROVIDER_SITE_OTHER): Payer: Medicare Other | Admitting: Internal Medicine

## 2019-11-18 DIAGNOSIS — J4530 Mild persistent asthma, uncomplicated: Secondary | ICD-10-CM | POA: Diagnosis not present

## 2019-11-18 DIAGNOSIS — G4733 Obstructive sleep apnea (adult) (pediatric): Secondary | ICD-10-CM | POA: Diagnosis not present

## 2019-11-18 NOTE — Patient Instructions (Signed)
We can continue CPAP auto 5-15, mask of choice, humidifier, supplies, AirView/ card  Ok to continue current meds for your breathing  Please call if we can help

## 2019-11-18 NOTE — Assessment & Plan Note (Signed)
Uncomplicated Plan- continue Symbicort

## 2019-11-18 NOTE — Progress Notes (Signed)
Patient ID: [name] [name], female    DOB: 1950/10/05, 69 y.o.   MRN: [id number]   Brief patient profile:   69 yobf never regular smoker quit in the 1977   followed for asthma, sleep apnea, complicated by hx depression, vocal cord paralysis, GERD. NPSG 07/19/10  AHI 7.3/ hr, desaturation to 83%, body weight 186 lbs PFT 07/19/2010-moderate obstructive airways disease, insignificant response to bronchodilator, FEV1/FVC 0.65, TLC 63%, DLCO 71% office spirometry ([place])  09/09/2018-reviewed. FVC 1.75/86%, FEV1 1.33/84%, ratio 0.76, FEF 25-75% 1.22/78% --------------------------------------------------------------------------------------  10/08/2018- 69 year old female former occasional smoker followed for asthma/COPD, OSA, complicated by history depression, vocal cord paralysis, GERD, obesity, DM 2, glaucoma CPAP auto 5-15/[facility] Download 87% compliance AHI 0.9/hour. -----concerns about humidification on CPAP.  thinks its not working right and feels "dried out" Asthma/COPD addressed by allergist including office spirometry 09/09/2018-reviewed. FVC 1.75/86%, FEV1 1.33/84%, ratio 0.76, FEF 25-75% 1.22/78%.  Cough artifact noted Pro-air HFA,  Symbicort 160, azelastine nasal spray, flonase, singulair Her allergist, Dr. Maudie Mercury, is helping manage allergy and asthma symptoms.  She feels well controlled.  Sleep quality varies-wears CPAP every night but sometimes tosses and turns.  Pressure is  comfortable but does not know how to adjust humidifier.  11/18/19- Virtual Visit via Telephone Note  I connected with [name] [name] on 11/18/19 at 10:30 AM EDT by telephone and verified that I am speaking with the correct person using two identifiers.  Location: Patient: H Provider: O   I discussed the limitations, risks, security and privacy concerns of performing an evaluation and management service by telephone and the availability of in person appointments. I also discussed with the patient that  there may be a patient responsible charge related to this service. The patient expressed understanding and agreed to proceed.   History of Present Illness: 69 year old female former occasional smoker followed for asthma/COPD, OSA, complicated by history depression, vocal cord paralysis, GERD, obesity, DM 2, glaucoma CPAP auto 5-15/[facility]  Singulair, Astelin, albuterol hfa, Symbicort 160 Reports doing well with CPAP. Had Covid infection early Feb, Rx'd with infusion as outpatient. Feels back to normal and pending Covid vax when eligible.  Breathing ok, using daily Symbicort, rare ventlin. Dome DOE steps and long walks after being indoors most of past year.Last CXR 2019 was clear.   Observations/Objective: Download compliance 93%, AHI 1.4/ hr  Assessment and Plan: OSA- doing well- continue auto 5-15 Asthma/ COPD overlap- well-controlled, to continue Symbicort  Follow Up Instructions: 1 year   I discussed the assessment and treatment plan with the patient. The patient was provided an opportunity to ask questions and all were answered. The patient agreed with the plan and demonstrated an understanding of the instructions.   The patient was advised to call back or seek an in-person evaluation if the symptoms worsen or if the condition fails to improve as anticipated.  I provided 18 minutes of non-face-to-face time during this encounter.   [name] Lyons, MD    ROS-see HPI   + = positive Constitutional:    weight loss, night sweats, fevers, chills, fatigue, lassitude. HEENT:    headaches, difficulty swallowing, tooth/dental problems, sore throat,       sneezing, itching, ear ache, nasal congestion, post nasal drip, snoring CV:    chest pain, orthopnea, PND, swelling in lower extremities, anasarca,  dizziness, palpitations Resp:   shortness of breath with exertion or at rest.                productive cough,   non-productive  cough, coughing up of blood.              change in color of mucus.  wheezing.   Skin:    rash or lesions. GI:  No-   heartburn, indigestion, abdominal pain, nausea, vomiting, diarrhea,                 change in bowel habits, loss of appetite GU: dysuria, change in color of urine, no urgency or frequency.   flank pain. MS:   joint pain, stiffness, decreased range of motion, back pain. Neuro-     nothing unusual Psych:  change in mood or affect.  depression or anxiety.   memory loss.  OBJ- Physical Exam General- Alert, Oriented, Affect-appropriate, Distress- none acute, + obese Skin- rash-none, lesions- none, excoriation- none Lymphadenopathy- none Head- atraumatic            Eyes- + impaired/ thick glasses, PERRLA, conjunctivae and secretions clear            Ears- Hearing, canals-normal            Nose- Clear, no-Septal dev, mucus, polyps, erosion, perforation             Throat- Mallampati II , mucosa clear , drainage- none, tonsils- atrophic Neck- flexible , trachea midline, no stridor , thyroid nl, carotid no bruit Chest - symmetrical excursion , unlabored           Heart/CV- RRR , no murmur , no gallop  , no rub, nl s1 s2                           - JVD- none , edema- none, stasis changes- none, varices- none           Lung- clear to P&A, wheeze- none, cough- none , dullness-none, rub- none           Chest wall-  Abd-  Br/ Gen/ Rectal- Not done, not indicated Extrem- cyanosis- none, clubbing, none, atrophy- none, strength- nl Neuro- grossly intact to observation

## 2019-11-18 NOTE — Assessment & Plan Note (Signed)
Benefits from CPAP with good compliance and control demonstrated by download Plan- continue auto 5-15

## 2019-11-25 ENCOUNTER — Ambulatory Visit (HOSPITAL_COMMUNITY): Payer: Medicare Other | Admitting: Psychiatry

## 2019-11-25 DIAGNOSIS — G4733 Obstructive sleep apnea (adult) (pediatric): Secondary | ICD-10-CM | POA: Diagnosis not present

## 2019-11-26 ENCOUNTER — Other Ambulatory Visit: Payer: Self-pay

## 2019-11-26 ENCOUNTER — Ambulatory Visit (HOSPITAL_COMMUNITY)
Admission: RE | Admit: 2019-11-26 | Discharge: 2019-11-26 | Disposition: A | Discharge status: Home / Self Care | Payer: Medicare Other | Source: Ambulatory Visit | Attending: Family Medicine | Admitting: Family Medicine

## 2019-11-26 DIAGNOSIS — G8929 Other chronic pain: Secondary | ICD-10-CM

## 2019-11-26 DIAGNOSIS — M545 Low back pain: Secondary | ICD-10-CM | POA: Diagnosis not present

## 2019-11-30 ENCOUNTER — Telehealth: Payer: Self-pay

## 2019-11-30 NOTE — Telephone Encounter (Signed)
Patient calls nurse line requesting recent Xray results. Please advise.

## 2019-12-01 NOTE — Telephone Encounter (Signed)
Dear [name] [name] Team Her arthritis is somewhat worse han before. That is why herback hurts. If hewants to consider surgery or other intervention, have her let me know. We had talked about possibly epidural steroid injections, but she would  Likely need MRI of back first. THANKS! [name]

## 2019-12-02 NOTE — Telephone Encounter (Signed)
Contacted pt and informed her of below and she is going to think about it and will let us know what she decides.Afnan Emberton Zimmerman Rumple, CMA

## 2019-12-15 ENCOUNTER — Ambulatory Visit (INDEPENDENT_AMBULATORY_CARE_PROVIDER_SITE_OTHER): Payer: Medicare Other | Admitting: Allergy

## 2019-12-15 ENCOUNTER — Other Ambulatory Visit: Payer: Self-pay

## 2019-12-15 ENCOUNTER — Encounter: Payer: Self-pay | Admitting: Allergy

## 2019-12-15 VITALS — BP 124/80 | HR 70 | Temp 98.4°F | Resp 18 | Ht 61.0 in

## 2019-12-15 DIAGNOSIS — K219 Gastro-esophageal reflux disease without esophagitis: Secondary | ICD-10-CM

## 2019-12-15 DIAGNOSIS — Z91030 Bee allergy status: Secondary | ICD-10-CM | POA: Diagnosis not present

## 2019-12-15 DIAGNOSIS — J302 Other seasonal allergic rhinitis: Secondary | ICD-10-CM | POA: Diagnosis not present

## 2019-12-15 DIAGNOSIS — J3089 Other allergic rhinitis: Secondary | ICD-10-CM

## 2019-12-15 DIAGNOSIS — J4540 Moderate persistent asthma, uncomplicated: Secondary | ICD-10-CM

## 2019-12-15 MED ORDER — ALBUTEROL SULFATE HFA 108 (90 BASE) MCG/ACT IN AERS: INHALATION_SPRAY | RESPIRATORY_TRACT | 1 refills | Status: DC

## 2019-12-15 MED ORDER — LEVOCETIRIZINE DIHYDROCHLORIDE 5 MG PO TABS: 5.0000 mg | ORAL_TABLET | Freq: Every evening | ORAL | 2 refills | Status: DC

## 2019-12-15 NOTE — Progress Notes (Signed)
Follow Up Note  RE: [name] [name] MRN: [id number] DOB: 03/03/1951 Date of Office Visit: 12/15/2019  Referring provider: Dickie La, MD Primary care provider: Dickie La, MD  Chief Complaint: Asthma  History of Present Illness: I had the pleasure of seeing [name] [name] for a follow up visit at the [facility] on 12/15/2019. She is a 69 y.o. female, who is being followed for asthma, allergic rhinitis, LPRD, bee sting allergy and rash. Her previous allergy office visit was on 08/11/2019 with Dr. Maudie Mercury. Today is a regular follow up visit.  Moderate persistent asthma  Currently on Symbicort 160 2 puffs twice a day and Singulair 10mg  daily.  Uses albuterol 1-2 time a week for shortness of breath mainly for exertion related issues with good benefit.  Denies any ER/urgent care visits or prednisone use since the last visit.  Seasonal and perennial allergic rhinitis Currently on Flonase 1 spray twice a day and azelastine 2 sprays per nostril 1-2 times a day. No nosebleeds. Complaining of some dry nasal passages. She also has a CPAP which dries her out too.   LPRD (laryngopharyngeal reflux disease) Stable with Nexium, Pepcid.   Bee sting allergy No stings since last visit.   Assessment and Plan: Lunafreya is a 69 y.o. female with: Moderate persistent asthma without complication Today's ACT score was 25. Asthma is doing well.  Uses albuterol 1-2 times a week for exertion related dyspnea with good benefit.  Today's spirometry was unremarkable.  Daily controller medication(s):continue Symbicort 160 2 puffs twice a day with spacer and rinse mouth afterwards. ? Continue Singulair 10mg  daily   Prior to physical activity:May use albuterol rescue inhaler 2 puffs 5 to 15 minutes prior to strenuous physical activities.  Rescue medications:May use albuterol rescue inhaler 2 puffs or nebulizer every 4 to 6 hours as needed for shortness of breath, chest  tightness, coughing, and wheezing. Monitor frequency of use.  Seasonal and perennial allergic rhinitis Past history - 2018 skin testing was positive to dust mites and tree pollen. Interim history - complaining of dry nasal passages.  Continue environmental control measures.  Use Flonase 1 spray twice a day.  Stop Azelastine and only use if having issues with drainage or runny nose.   May use azelastine nasal spray 1-2 sprays per nostril twice a day as needed for drainage.   Nasal saline spray (i.e., Simply Saline) or nasal saline lavage (i.e., NeilMed) is recommended as needed and prior to medicated nasal sprays.  May use over the counter antihistamines such as Zyrtec (cetirizine), Claritin (loratadine), Allegra (fexofenadine), or Xyzal (levocetirizine) daily as needed.  LPRD (laryngopharyngeal reflux disease) Stable.   Continue Nexium 40mg  twice a day.   Continue Pepcid 40mg  daily.  Bee sting allergy No stings since last visit.   For mild symptoms you can take over the counter antihistamines such as Benadryl and monitor symptoms closely. If symptoms worsen or if you have severe symptoms including breathing issues, throat closure, significant swelling, whole body hives, severe diarrhea and vomiting, lightheadedness then inject epinephrine and seek immediate medical care afterwards.  Consider testing in future.  Return in about 5 months (around 05/16/2020).  Meds ordered this encounter  Medications  . levocetirizine (XYZAL) 5 MG tablet    Sig: Take 1 tablet (5 mg total) by mouth every evening. As needed for the hives/rash.    Dispense:  90 tablet    Refill:  2  . albuterol (VENTOLIN HFA) 108 (90 Base)  MCG/ACT inhaler    Sig: USE 2 PUFFS BY MOUTH INTO  THE LUNGS EVERY 4 HOURS AS  NEEDED FOR WHEEZING OR  SHORTNESS OF BREATH    Dispense:  51 g    Refill:  1   Diagnostics: Spirometry:  Tracings reviewed. Her effort: Good reproducible efforts. FVC: 1.66L FEV1: 1.34L, 81%  predicted FEV1/FVC ratio: 81% Interpretation: Spirometry consistent with possible restrictive disease.  Please see scanned spirometry results for details.  Medication List:  Current Outpatient Medications  Medication Sig Dispense Refill  . ACCU-CHEK FASTCLIX LANCETS MISC TEST THREE TIMES DAILY 306 each 12  . acyclovir (ZOVIRAX) 200 MG capsule TAKE 1 CAPSULE BY MOUTH TWO TIMES DAILY 180 capsule 3  . albuterol (VENTOLIN HFA) 108 (90 Base) MCG/ACT inhaler USE 2 PUFFS BY MOUTH INTO  THE LUNGS EVERY 4 HOURS AS  NEEDED FOR WHEEZING OR  SHORTNESS OF BREATH 51 g 1  . Alcohol Swabs (B-D SINGLE USE SWABS REGULAR) PADS USE THREE TIMES DAILY 300 each 3  . allopurinol (ZYLOPRIM) 300 MG tablet TAKE 1 TABLET BY MOUTH  DAILY 90 tablet 3  . ammonium lactate (LAC-HYDRIN) 12 % lotion APPLY 1 APPLICATION TOPICALLY TWICE DAILY AS NEEDED FOR DRY SKIN (SUBSTITUTED FOR LAC HYDRIN) 400 g 12  . aspirin EC 81 MG tablet Take 81 mg by mouth daily.    [name] atorvastatin (LIPITOR) 40 MG tablet TAKE 1 TABLET BY MOUTH  EVERY DAY 90 tablet 3  . azelastine (ASTELIN) 0.1 % nasal spray USE 2 SPRAYS IN EACH NOSTRIL EVERY DAY AS NEEDED AS DIRECTED 60 mL 3  . B-D ULTRAFINE III SHORT PEN 31G X 8 MM MISC USE TO INJECT THREE TIMES DAILY 270 each 3  . bisacodyl (DULCOLAX) 10 MG suppository Place 1 suppository (10 mg total) rectally as needed for moderate constipation. 12 suppository 0  . budesonide-formoterol (SYMBICORT) 160-4.5 MCG/ACT inhaler INHALE 2 PUFFS INTO THE LUNGS 2 (TWO) TIMES DAILY. RINSE, GARGLE, AND SPIT AFTER USE. 3 Inhaler 1  . buPROPion (WELLBUTRIN) 100 MG tablet TAKE 1 TABLET BY MOUTH  TWICE DAILY 180 tablet 0  . COMBIGAN 0.2-0.5 % ophthalmic solution INSTILL 1 DROP INTO THE  LEFT EYE EVERY 12 HOURS 5 mL 12  . EPINEPHrine 0.3 mg/0.3 mL IJ SOAJ injection Inject 0.3 mLs (0.3 mg total) into the muscle once as needed (for allergic reaction). 1 Device 3  . esomeprazole (NEXIUM) 40 MG capsule TAKE 1 CAPSULE EVERY DAY 90 capsule  3  . ezetimibe (ZETIA) 10 MG tablet Take 1 tablet (10 mg total) by mouth daily. 90 tablet 3  . famotidine (PEPCID) 40 MG tablet Take 1 tablet (40 mg total) by mouth daily. 60 tablet 0  . fluticasone (FLONASE) 50 MCG/ACT nasal spray USE 2 SPRAYS IN EACH  NOSTRIL DAILY 48 g 0  . furosemide (LASIX) 40 MG tablet Take one tablet by mouth daily 90 tablet 3  . glucose blood (PRODIGY NO CODING BLOOD GLUC) test strip USE AS INSTRUCTED TO CHECK BLOOD SUGAR THREE TIMES A DAY 300 each 12  . HUMALOG KWIKPEN 100 UNIT/ML KwikPen SUBCUTANEOUSLY INJECT 35  UNITS 2 TIMES DAILY AFTER  MEALS AS DIRECTED 75 mL 3  . Insulin Glargine (LANTUS SOLOSTAR) 100 UNIT/ML Solostar Pen INJECT SUBCUTANEOUSLY 35  UNITS TWO TIMES DAILY 75 mL 3  . Insulin Syringe-Needle U-100 (BD INSULIN SYRINGE ULTRAFINE) 31G X 15/64" 0.5 ML MISC Use to inject three times a day 300 each 2  . latanoprost (XALATAN) 0.005 % ophthalmic solution Place 1  drop into both eyes at bedtime. 2.5 mL 3  . levocetirizine (XYZAL) 5 MG tablet Take 1 tablet (5 mg total) by mouth every evening. As needed for the hives/rash. 90 tablet 2  . LINZESS 290 MCG CAPS capsule Take 290 mcg by mouth daily as needed.     [name] losartan (COZAAR) 50 MG tablet Take 2 tablets (100 mg total) by mouth daily. 90 tablet 3  . metoCLOPramide (REGLAN) 5 MG tablet TAKE 1 TABLET BY MOUTH 4  TIMES DAILY 360 tablet 3  . metoprolol succinate (TOPROL-XL) 50 MG 24 hr tablet TAKE 3 TABLETS BY MOUTH  EVERY DAY WITH OR  IMMEDIATELY FOLLOWING A  MEAL 270 tablet 3  . montelukast (SINGULAIR) 10 MG tablet TAKE 1 TABLET BY MOUTH  DAILY 90 tablet 3  . Multiple Vitamins-Minerals (MULTIVITAMIN PO) Take 1 tablet by mouth daily.    . RESTASIS MULTIDOSE 0.05 % ophthalmic emulsion Place 1 drop into both eyes daily as needed.  3  . sertraline (ZOLOFT) 100 MG tablet TAKE 1 TABLET BY MOUTH  DAILY 90 tablet 0  . spironolactone (ALDACTONE) 25 MG tablet TAKE 1 TABLET BY MOUTH  EVERY DAY BEFORE SUPPER 90 tablet 3  .  traMADol (ULTRAM) 50 MG tablet TAKE 1-2 TABLETS BY MOUTH EVERY 8 HOURS AS NEEDED FOR PAIN. MAX 6 TABLETS PER 24 HOURS 180 tablet 5  . TRULICITY 1.5 0000000 SOPN INJECT SUBCUTANEOUSLY 1.5  MG EVERY WEEK 6 mL 3   No current facility-administered medications for this visit.   Allergies: Allergies  Allergen Reactions  . Bee Venom Hives and Swelling  . Propoxyphene Hcl Itching  . Amlodipine Besylate Swelling  . Hydrocodone Other (See Comments)    With Vicodin - makes patient "jittery", and "hangover effect - sleepy next day"  . Lisinopril Cough    Changed to ARB  . Metformin And Related Diarrhea    GI distress  . Metronidazole Other (See Comments)    REACTION: "just didn't work; my body never did heal from it"  . Valsartan Other (See Comments)    REACTION:  "sleep more the next day after I took it; it made me real tired"  . Xyzal [Levocetirizine Dihydrochloride] Rash   I reviewed her past medical history, social history, family history, and environmental history and no significant changes have been reported from her previous visit.  Review of Systems  Constitutional: Negative for appetite change, chills, fever and unexpected weight change.  HENT: Negative for congestion and rhinorrhea.   Eyes: Negative for itching.  Respiratory: Negative for cough, chest tightness, shortness of breath and wheezing.   Gastrointestinal: Negative for abdominal pain.  Skin: Negative for rash.  Allergic/Immunologic: Positive for environmental allergies. Negative for food allergies.  Neurological: Negative for headaches.   Objective: BP 124/80   Pulse 70   Temp 98.4 F (36.9 C) (Temporal)   Resp 18   Ht 5\' 1"  (1.549 m)   SpO2 100%   BMI 36.43 kg/m  Body mass index is 36.43 kg/m. Physical Exam  Constitutional: She is oriented to person, place, and time. She appears well-developed and well-nourished.  HENT:  Head: Normocephalic and atraumatic.  Right Ear: External ear normal.  Left Ear:  External ear normal.  Nose: Nose normal.  Mouth/Throat: Oropharynx is clear and moist.  Eyes: Conjunctivae and EOM are normal.  Cardiovascular: Normal rate, regular rhythm and normal heart sounds. Exam reveals no gallop and no friction rub.  No murmur heard. Pulmonary/Chest: Effort normal and breath sounds normal.  She has no wheezes. She has no rales.  Musculoskeletal:     Cervical back: Neck supple.  Neurological: She is alert and oriented to person, place, and time.  Skin: Skin is warm. No rash noted.  Psychiatric: She has a normal mood and affect. Her behavior is normal.  Nursing note and vitals reviewed.  Previous notes and tests were reviewed. The plan was reviewed with the patient/family, and all questions/concerned were addressed.  It was my pleasure to see Kimbre today and participate in her care. Please feel free to contact me with any questions or concerns.  Sincerely,  Rexene Alberts, DO Allergy & Immunology  [facility] office: [phone] [facility] office: [place] office: [phone]

## 2019-12-15 NOTE — Patient Instructions (Addendum)
Moderate persistent asthma without complication Refilled albuterol to mail order pharmacy.   Daily controller medication(s):continue Symbicort 160 2 puffs twice a day with spacer and rinse mouth afterwards. ? Continue Singulair 10mg  daily   Prior to physical activity:May use albuterol rescue inhaler 2 puffs 5 to 15 minutes prior to strenuous physical activities.  Rescue medications:May use albuterol rescue inhaler 2 puffs or nebulizer every 4 to 6 hours as needed for shortness of breath, chest tightness, coughing, and wheezing. Monitor frequency of use. Asthma control goals:  Full participation in all desired activities (may need albuterol before activity) Albuterol use two times or less a week on average (not counting use with activity) Cough interfering with sleep two times or less a month Oral steroids no more than once a year No hospitalizations  Seasonal and perennial allergic rhinitis Past history - 2018 skin testing was positive to dust mites and tree pollen.  Continue environmental control measures.  Use Flonase 1 spray twice a day.  Stop Azelastine and only use if you have issues with drainage or runny nose.   May use azelastine nasal spray 1-2 sprays per nostril twice a day as needed for drainage.   Nasal saline spray (i.e., Simply Saline) or nasal saline lavage (i.e., NeilMed) is recommended as needed and prior to medicated nasal sprays.  May use over the counter antihistamines such as Zyrtec (cetirizine), Claritin (loratadine), Allegra (fexofenadine), or Xyzal (levocetirizine) daily as needed.  LPRD (laryngopharyngeal reflux disease)  Continue Nexium 40mg  twice a day.   Continue pepcid 40mg  daily.    Bee sting allergy  For mild symptoms you can take over the counter antihistamines such as Benadryl and monitor symptoms closely. If symptoms worsen or if you have severe symptoms including breathing issues, throat closure, significant swelling, whole body hives,  severe diarrhea and vomiting, lightheadedness then inject epinephrine and seek immediate medical care afterwards.  Consider testing in future.  Follow up in 5 months or sooner if needed.

## 2019-12-15 NOTE — Assessment & Plan Note (Signed)
Stable.   Continue Nexium 40mg  twice a day.   Continue Pepcid 40mg  daily.

## 2019-12-15 NOTE — Assessment & Plan Note (Signed)
No stings since last visit.  °· For mild symptoms you can take over the counter antihistamines such as Benadryl and monitor symptoms closely. If symptoms worsen or if you have severe symptoms including breathing issues, throat closure, significant swelling, whole body hives, severe diarrhea and vomiting, lightheadedness then inject epinephrine and seek immediate medical care afterwards. °· Consider testing in future. °

## 2019-12-15 NOTE — Assessment & Plan Note (Signed)
Past history - 2018 skin testing was positive to dust mites and tree pollen. Interim history - complaining of dry nasal passages.  Continue environmental control measures.  Use Flonase 1 spray twice a day.  Stop Azelastine and only use if having issues with drainage or runny nose.   May use azelastine nasal spray 1-2 sprays per nostril twice a day as needed for drainage.   Nasal saline spray (i.e., Simply Saline) or nasal saline lavage (i.e., NeilMed) is recommended as needed and prior to medicated nasal sprays.  May use over the counter antihistamines such as Zyrtec (cetirizine), Claritin (loratadine), Allegra (fexofenadine), or Xyzal (levocetirizine) daily as needed.

## 2019-12-15 NOTE — Assessment & Plan Note (Signed)
Today's ACT score was 25. Asthma is doing well.  Uses albuterol 1-2 times a week for exertion related dyspnea with good benefit.  Today's spirometry was unremarkable.  Daily controller medication(s):continue Symbicort 160 2 puffs twice a day with spacer and rinse mouth afterwards. ? Continue Singulair 10mg  daily   Prior to physical activity:May use albuterol rescue inhaler 2 puffs 5 to 15 minutes prior to strenuous physical activities.  Rescue medications:May use albuterol rescue inhaler 2 puffs or nebulizer every 4 to 6 hours as needed for shortness of breath, chest tightness, coughing, and wheezing. Monitor frequency of use.

## 2019-12-17 ENCOUNTER — Other Ambulatory Visit: Payer: Self-pay | Admitting: *Deleted

## 2019-12-17 MED ORDER — PROAIR HFA 108 (90 BASE) MCG/ACT IN AERS: 2.0000 | INHALATION_SPRAY | RESPIRATORY_TRACT | 1 refills | Status: DC | PRN

## 2019-12-20 ENCOUNTER — Other Ambulatory Visit: Payer: Medicare Other

## 2019-12-23 ENCOUNTER — Ambulatory Visit
Admission: RE | Admit: 2019-12-23 | Discharge: 2019-12-23 | Disposition: A | Discharge status: Home / Self Care | Payer: Medicare Other | Source: Ambulatory Visit | Attending: Family Medicine | Admitting: Family Medicine

## 2019-12-23 ENCOUNTER — Other Ambulatory Visit: Payer: Self-pay

## 2019-12-23 DIAGNOSIS — N644 Mastodynia: Secondary | ICD-10-CM

## 2019-12-23 DIAGNOSIS — L723 Sebaceous cyst: Secondary | ICD-10-CM | POA: Diagnosis not present

## 2019-12-23 DIAGNOSIS — N63 Unspecified lump in unspecified breast: Secondary | ICD-10-CM

## 2019-12-24 ENCOUNTER — Other Ambulatory Visit: Payer: Self-pay

## 2019-12-24 MED ORDER — BD SWAB SINGLE USE REGULAR PADS: MEDICATED_PAD | 3 refills | Status: AC

## 2019-12-30 ENCOUNTER — Telehealth (INDEPENDENT_AMBULATORY_CARE_PROVIDER_SITE_OTHER): Payer: Medicare Other | Admitting: Psychiatry

## 2019-12-30 ENCOUNTER — Encounter (HOSPITAL_COMMUNITY): Payer: Self-pay | Admitting: Psychiatry

## 2019-12-30 ENCOUNTER — Other Ambulatory Visit: Payer: Self-pay

## 2019-12-30 DIAGNOSIS — F330 Major depressive disorder, recurrent, mild: Secondary | ICD-10-CM

## 2019-12-30 DIAGNOSIS — F411 Generalized anxiety disorder: Secondary | ICD-10-CM

## 2019-12-30 MED ORDER — SERTRALINE HCL 100 MG PO TABS: 100.0000 mg | ORAL_TABLET | Freq: Every day | ORAL | 0 refills | Status: DC

## 2019-12-30 MED ORDER — BUPROPION HCL 100 MG PO TABS: 100.0000 mg | ORAL_TABLET | Freq: Two times a day (BID) | ORAL | 0 refills | Status: DC

## 2019-12-30 NOTE — BH Specialist Note (Signed)
Virtual Visit via Telephone Note  I connected with [name] [name] on 12/30/19 at  4:15 PM EDT by telephone and verified that I am speaking with the correct person using two identifiers.  Location: Patient: home Provider: office   I discussed the limitations, risks, security and privacy concerns of performing an evaluation and management service by telephone and the availability of in person appointments. I also discussed with the patient that there may be a patient responsible charge related to this service. The patient expressed understanding and agreed to proceed.   History of Present Illness: [name] [name] states she is alright. She had a mild case of COVID in March. She is now spending a lot of time at home. It is boring. She is social with family and friend over the phone. [name] [name] states her depression is stable. She denies SI/HI. Her anxiety is mild and manageable.    Observations/Objective:  General Appearance: unable to assess  Eye Contact:  unable to assess  Speech:  Clear and Coherent and Normal Rate  Volume:  Normal  Mood:  Euthymic  Affect:  Full Range  Thought Process:  Goal Directed, Linear and Descriptions of Associations: Intact  Orientation:  Full (Time, Place, and Person)  Thought Content:  Logical  Suicidal Thoughts:  No  Homicidal Thoughts:  No  Memory:  Immediate;   Good  Judgement:  Good  Insight:  Good  Psychomotor Activity: unable to assess  Concentration:  Concentration: Good  Recall:  Good  Fund of Knowledge:  Good  Language:  Good  Akathisia:  unable to assess  Handed:  Right  AIMS (if indicated):     Assets:  Communication Skills Desire for Improvement Financial Resources/Insurance Housing Resilience Social Support Talents/Skills Transportation Vocational/Educational  ADL's:  unable to assess  Cognition:  WNL  Sleep:         Assessment and Plan: GAD; MDD- recurrent, mild  Zoloft 100mg  po qD Wellbutrin 100mg  po BID  Follow Up  Instructions: In 3-4 months or sooner if needed   I discussed the assessment and treatment plan with the patient. The patient was provided an opportunity to ask questions and all were answered. The patient agreed with the plan and demonstrated an understanding of the instructions.   The patient was advised to call back or seek an in-person evaluation if the symptoms worsen or if the condition fails to improve as anticipated.  I provided 15 minutes of non-face-to-face time during this encounter.   Charlcie Cradle, MD

## 2020-01-03 ENCOUNTER — Encounter: Payer: Self-pay | Admitting: Family Medicine

## 2020-01-20 ENCOUNTER — Encounter: Payer: Self-pay | Admitting: Family Medicine

## 2020-01-20 ENCOUNTER — Ambulatory Visit (INDEPENDENT_AMBULATORY_CARE_PROVIDER_SITE_OTHER): Payer: Medicare Other | Admitting: Family Medicine

## 2020-01-20 ENCOUNTER — Other Ambulatory Visit: Payer: Self-pay

## 2020-01-20 DIAGNOSIS — F339 Major depressive disorder, recurrent, unspecified: Secondary | ICD-10-CM | POA: Diagnosis not present

## 2020-01-20 DIAGNOSIS — M5416 Radiculopathy, lumbar region: Secondary | ICD-10-CM | POA: Diagnosis not present

## 2020-01-20 DIAGNOSIS — M1610 Unilateral primary osteoarthritis, unspecified hip: Secondary | ICD-10-CM | POA: Diagnosis not present

## 2020-01-20 MED ORDER — DULOXETINE HCL 30 MG PO CPEP: 30.0000 mg | ORAL_CAPSULE | Freq: Every day | ORAL | 3 refills | Status: DC

## 2020-01-20 NOTE — Patient Instructions (Signed)
Dr. Nori Riis will let you know about the hip xray results. I am trying to figure out how much of your pain is from your back and how much from your hip. Stop the zoloft. I sent in a prescription for duloxetine which will hopefully be as good as the zoloft for depression and also help the nerve pain in your left leg. See Dr. Nori Riis in one month.

## 2020-01-20 NOTE — Assessment & Plan Note (Signed)
Unclear how much of left hip pain is radiculopathy versus known severe OA of hip. No recent x rays.  Investigate in that she may be helped more by hip replacement surgery than any sort of back surgery.

## 2020-01-20 NOTE — Progress Notes (Signed)
    SUBJECTIVE:   CHIEF COMPLAINT / HPI:   Acute on chronic low back pain.  [name] [name] states that she has had intermitent low back pain for 4-5 years - probably longer since I reviewed a lumbar MRI from 2013.  Severe over past 2 months.  Now with radiation into left hip - maybe to knee.  No weakness or recent falls.  (Did fall x1 in 2020 well before the severe flair of pain.  She is reaching her wits end.  Pain is severe.  No fever.  No bowel or bladder changes.  No personal history of cancer.   Apparently tried gabapentin recently, which did not help taking one or two tabs.  When she got up to three tabs, it made her sick. She has also apparently had epidural steroid injections at some point.  She was told that she can't have any more shots after one caused her to have "an astma attack." Known OA of both hips.  No recent films Depression is stable.  She is not wed to taking zoloft.    OBJECTIVE:   BP (!) 158/72   Pulse 70   Ht 5\' 1"  (1.549 m)   Wt 192 lb (87.1 kg)   SpO2 99%   BMI 36.28 kg/m   VS noted Uses a walker.  Walks and stands normally with walker.  No leg weakness.    ASSESSMENT/PLAN:   Lumbar back pain with radiculopathy affecting left lower extremity She feels something needs to be done.  Discussed that surgery is typically not a good choice and I talked her out of needing an MRI for now.  We did switch her zoloft to duloxetine hoping to get some neuropathic pain relief.  Also, unclear how much of left hip pain is radiculopathy versus known severe OA of hip.    Hip arthritis, bilateral hip pain Unclear how much of left hip pain is radiculopathy versus known severe OA of hip. No recent x rays.  Investigate in that she may be helped more by hip replacement surgery than any sort of back surgery.  Major depressive disorder, recurrent episode Stable.  She is willing to DC zoloft and switch to duloxetine to see if it helps her pain.     Zenia Resides, MD [place]

## 2020-01-20 NOTE — Assessment & Plan Note (Signed)
She feels something needs to be done.  Discussed that surgery is typically not a good choice and I talked her out of needing an MRI for now.  We did switch her zoloft to duloxetine hoping to get some neuropathic pain relief.  Also, unclear how much of left hip pain is radiculopathy versus known severe OA of hip.

## 2020-01-20 NOTE — Assessment & Plan Note (Signed)
Stable.  She is willing to DC zoloft and switch to duloxetine to see if it helps her pain.

## 2020-01-21 ENCOUNTER — Ambulatory Visit
Admission: RE | Admit: 2020-01-21 | Discharge: 2020-01-21 | Disposition: A | Discharge status: Home / Self Care | Payer: Medicare Other | Source: Ambulatory Visit | Attending: Family Medicine | Admitting: Family Medicine

## 2020-01-21 DIAGNOSIS — M1610 Unilateral primary osteoarthritis, unspecified hip: Secondary | ICD-10-CM

## 2020-01-21 DIAGNOSIS — M25552 Pain in left hip: Secondary | ICD-10-CM | POA: Diagnosis not present

## 2020-01-25 ENCOUNTER — Telehealth: Payer: Self-pay

## 2020-01-25 NOTE — Telephone Encounter (Signed)
Patient calls nurse line regarding hip x-ray results. Informed patient of negative impression. Patient requesting phone call from provider regarding next steps.   Patient states that she is going to be at her home address tomorrow and requests phone call returned to [phone].   To PCP  Please advise  Talbot Grumbling, RN

## 2020-01-27 NOTE — Telephone Encounter (Signed)
Dwt Please let her know I am out of town with spotty cell phone coverage so I will likely call her when I he back next Tuesday but tell her I looked at the films. There is a fair amount of arthritis so the next steps would be either  1.Try a hip joint injection 2. See an orthopedist and start thinking about a hip replacement Tell her not to get too excited or worried about a hip replacement as it is really much less of a surgery than previously--it is actually easier to recover from than a knee replacement. If she wants I can do a hip injection next week and then we can see if she wants to see an ortho surgeon. THANKS!

## 2020-01-28 NOTE — Telephone Encounter (Signed)
Contacted pt and informed her of below and she said she would like for PCP to call her next week on Wednesday if possible.  I also informed her that the June schedule was full and that she could call and check for any cancellations also that the July schedule should be open by 2nd-3rd week in June so she can get an appointment then. Ying Blankenhorn Zimmerman Rumple, CMA

## 2020-02-01 ENCOUNTER — Telehealth: Payer: Self-pay | Admitting: Family Medicine

## 2020-02-01 ENCOUNTER — Other Ambulatory Visit: Payer: Self-pay | Admitting: Family Medicine

## 2020-02-01 ENCOUNTER — Other Ambulatory Visit: Payer: Self-pay | Admitting: Allergy

## 2020-02-01 NOTE — Telephone Encounter (Signed)
Surprised by normal x ray read when had arthritis documented in 2016.  Called and left message saying: Arthritis in hip was not worse. Please keep FU appointment with Dr. Nori Riis.

## 2020-02-01 NOTE — Telephone Encounter (Signed)
LVM Calling her back about her hip I will try again tomorrow [name] [name]

## 2020-02-03 NOTE — Telephone Encounter (Signed)
Patient calls nurse line returning PCP call.

## 2020-02-04 NOTE — Telephone Encounter (Signed)
Spoke w pt today and answered her questions She is going to schedule at [facility] for a hip injection

## 2020-02-09 ENCOUNTER — Ambulatory Visit (INDEPENDENT_AMBULATORY_CARE_PROVIDER_SITE_OTHER): Payer: Medicare Other | Admitting: Podiatry

## 2020-02-09 ENCOUNTER — Encounter: Payer: Self-pay | Admitting: Podiatry

## 2020-02-09 ENCOUNTER — Other Ambulatory Visit: Payer: Self-pay

## 2020-02-09 DIAGNOSIS — M79675 Pain in left toe(s): Secondary | ICD-10-CM

## 2020-02-09 DIAGNOSIS — E1142 Type 2 diabetes mellitus with diabetic polyneuropathy: Secondary | ICD-10-CM | POA: Diagnosis not present

## 2020-02-09 DIAGNOSIS — M79674 Pain in right toe(s): Secondary | ICD-10-CM

## 2020-02-09 DIAGNOSIS — B351 Tinea unguium: Secondary | ICD-10-CM

## 2020-02-09 DIAGNOSIS — L84 Corns and callosities: Secondary | ICD-10-CM

## 2020-02-09 NOTE — Patient Instructions (Signed)
Diabetes Mellitus and Foot Care Foot care is an important part of your health, especially when you have diabetes. Diabetes may cause you to have problems because of poor blood flow (circulation) to your feet and legs, which can cause your skin to:  Become thinner and drier.  Break more easily.  Heal more slowly.  Peel and crack. You may also have nerve damage (neuropathy) in your legs and feet, causing decreased feeling in them. This means that you may not notice minor injuries to your feet that could lead to more serious problems. Noticing and addressing any potential problems early is the best way to prevent future foot problems. How to care for your feet Foot hygiene  Wash your feet daily with warm water and mild soap. Do not use hot water. Then, pat your feet and the areas between your toes until they are completely dry. Do not soak your feet as this can dry your skin.  Trim your toenails straight across. Do not dig under them or around the cuticle. File the edges of your nails with an emery board or nail file.  Apply a moisturizing lotion or petroleum jelly to the skin on your feet and to dry, brittle toenails. Use lotion that does not contain alcohol and is unscented. Do not apply lotion between your toes. Shoes and socks  Wear clean socks or stockings every day. Make sure they are not too tight. Do not wear knee-high stockings since they may decrease blood flow to your legs.  Wear shoes that fit properly and have enough cushioning. Always look in your shoes before you put them on to be sure there are no objects inside.  To break in new shoes, wear them for just a few hours a day. This prevents injuries on your feet. Wounds, scrapes, corns, and calluses  Check your feet daily for blisters, cuts, bruises, sores, and redness. If you cannot see the bottom of your feet, use a mirror or ask someone for help.  Do not cut corns or calluses or try to remove them with medicine.  If you  find a minor scrape, cut, or break in the skin on your feet, keep it and the skin around it clean and dry. You may clean these areas with mild soap and water. Do not clean the area with peroxide, alcohol, or iodine.  If you have a wound, scrape, corn, or callus on your foot, look at it several times a day to make sure it is healing and not infected. Check for: ? Redness, swelling, or pain. ? Fluid or blood. ? Warmth. ? Pus or a bad smell. General instructions  Do not cross your legs. This may decrease blood flow to your feet.  Do not use heating pads or hot water bottles on your feet. They may burn your skin. If you have lost feeling in your feet or legs, you may not know this is happening until it is too late.  Protect your feet from hot and cold by wearing shoes, such as at the beach or on hot pavement.  Schedule a complete foot exam at least once a year (annually) or more often if you have foot problems. If you have foot problems, report any cuts, sores, or bruises to your health care provider immediately. Contact a health care provider if:  You have a medical condition that increases your risk of infection and you have any cuts, sores, or bruises on your feet.  You have an injury that is not   healing.  You have redness on your legs or feet.  You feel burning or tingling in your legs or feet.  You have pain or cramps in your legs and feet.  Your legs or feet are numb.  Your feet always feel cold.  You have pain around a toenail. Get help right away if:  You have a wound, scrape, corn, or callus on your foot and: ? You have pain, swelling, or redness that gets worse. ? You have fluid or blood coming from the wound, scrape, corn, or callus. ? Your wound, scrape, corn, or callus feels warm to the touch. ? You have pus or a bad smell coming from the wound, scrape, corn, or callus. ? You have a fever. ? You have a red line going up your leg. Summary  Check your feet every day  for cuts, sores, red spots, swelling, and blisters.  Moisturize feet and legs daily.  Wear shoes that fit properly and have enough cushioning.  If you have foot problems, report any cuts, sores, or bruises to your health care provider immediately.  Schedule a complete foot exam at least once a year (annually) or more often if you have foot problems. This information is not intended to replace advice given to you by your health care provider. Make sure you discuss any questions you have with your health care provider. Document Revised: 05/12/2019 Document Reviewed: 09/20/2016 [employer] Patient Education  2020 [employer]  

## 2020-02-11 NOTE — Progress Notes (Signed)
Subjective: [name] presents today at risk foot care with history of diabetic neuropathy and painful callus(es) b/l and painful mycotic toenails b/l that are difficult to trim. Pain interferes with ambulation. Aggravating factors include wearing enclosed shoe gear. Pain is relieved with periodic professional debridement.  Dickie La, MD is patient's PCP. Last visit was: 07/14/2019.  Past Medical History:  Diagnosis Date  . Allergy   . Angina   . Anxiety   . Arthritis   . Asthma   . Blood transfusion   . Blood transfusion without reported diagnosis   . Cataract   . Chronic cough    Now followed by pulmonary  . Chronic lower back pain   . Complication of anesthesia    "just wake up coughing; that's all"  . Concussion 11/18/11   "fell at dr's office; hit head"  . COPD (chronic obstructive pulmonary disease) ([place])   . Delayed gastric emptying   . Depression   . Diabetes mellitus type II    "take insulin & pills"  . Dyslipidemia   . Exertional dyspnea   . GERD (gastroesophageal reflux disease)   . Glaucoma   . Gout   . Headache(784.0) 11/18/11   "I've had mild headaches the last couple days"  . Headache(784.0) 01/08/12   "pretty constant since 11/18/11's concussion"  . Heart murmur   . History of colonoscopy   . HTN (hypertension)   . Myocardial infarction ([place])   . Osteoporosis   . Peripheral neuropathy   . Pneumonia    "i've had it once" (11/18/11)  . Sleep apnea    on CPAP 12  . Syncope 06/16/2012  . Vocal cord paralysis, unilateral partial    "right"     Current Outpatient Medications on File Prior to Visit  Medication Sig Dispense Refill  . ACCU-CHEK FASTCLIX LANCETS MISC TEST THREE TIMES DAILY 306 each 12  . acyclovir (ZOVIRAX) 200 MG capsule TAKE 1 CAPSULE BY MOUTH TWO TIMES DAILY 180 capsule 3  . albuterol (VENTOLIN HFA) 108 (90 Base) MCG/ACT inhaler USE 2 PUFFS BY MOUTH INTO  THE LUNGS EVERY 4 HOURS AS  NEEDED FOR WHEEZING OR  SHORTNESS OF BREATH  51 g 1  . Alcohol Swabs (B-D SINGLE USE SWABS REGULAR) PADS USE THREE TIMES DAILY 300 each 3  . allopurinol (ZYLOPRIM) 300 MG tablet TAKE 1 TABLET BY MOUTH  DAILY 90 tablet 3  . ammonium lactate (LAC-HYDRIN) 12 % lotion APPLY 1 APPLICATION TOPICALLY TWICE DAILY AS NEEDED FOR DRY SKIN (SUBSTITUTED FOR LAC HYDRIN) 400 g 12  . aspirin EC 81 MG tablet Take 81 mg by mouth daily.    [name] atorvastatin (LIPITOR) 40 MG tablet TAKE 1 TABLET BY MOUTH  EVERY DAY 90 tablet 3  . azelastine (ASTELIN) 0.1 % nasal spray USE 2 SPRAYS IN EACH NOSTRIL EVERY DAY AS NEEDED AS DIRECTED (Patient not taking: Reported on 12/30/2019) 60 mL 3  . B-D ULTRAFINE III SHORT PEN 31G X 8 MM MISC USE TO INJECT THREE TIMES DAILY 270 each 3  . bisacodyl (DULCOLAX) 10 MG suppository Place 1 suppository (10 mg total) rectally as needed for moderate constipation. 12 suppository 0  . budesonide-formoterol (SYMBICORT) 160-4.5 MCG/ACT inhaler USE 2 INHALATIONS BY MOUTH  TWICE DAILY - RINSE,  GARGLE, AND SPIT AFTER USE 30.6 g 3  . buPROPion (WELLBUTRIN) 100 MG tablet Take 1 tablet (100 mg total) by mouth 2 (two) times daily. 180 tablet 0  . COMBIGAN 0.2-0.5 % ophthalmic solution INSTILL  1 DROP INTO THE  LEFT EYE EVERY 12 HOURS 5 mL 12  . DULoxetine (CYMBALTA) 30 MG capsule Take 1 capsule (30 mg total) by mouth daily. 30 capsule 3  . EPINEPHrine 0.3 mg/0.3 mL IJ SOAJ injection Inject 0.3 mLs (0.3 mg total) into the muscle once as needed (for allergic reaction). (Patient not taking: Reported on 12/30/2019) 1 Device 3  . esomeprazole (NEXIUM) 40 MG capsule TAKE 1 CAPSULE BY MOUTH  DAILY 90 capsule 3  . ezetimibe (ZETIA) 10 MG tablet TAKE 1 TABLET BY MOUTH  DAILY 90 tablet 3  . famotidine (PEPCID) 40 MG tablet Take 1 tablet (40 mg total) by mouth daily. 60 tablet 0  . fluticasone (FLONASE) 50 MCG/ACT nasal spray USE 2 SPRAYS IN EACH  NOSTRIL DAILY 48 g 0  . furosemide (LASIX) 40 MG tablet Take one tablet by mouth daily 90 tablet 3  . glucose blood  (PRODIGY NO CODING BLOOD GLUC) test strip USE AS INSTRUCTED TO CHECK BLOOD SUGAR THREE TIMES A DAY 300 each 12  . HUMALOG KWIKPEN 100 UNIT/ML KwikPen SUBCUTANEOUSLY INJECT 35  UNITS 2 TIMES DAILY AFTER  MEALS AS DIRECTED 75 mL 3  . Insulin Glargine (LANTUS SOLOSTAR) 100 UNIT/ML Solostar Pen INJECT SUBCUTANEOUSLY 35  UNITS TWO TIMES DAILY 75 mL 3  . Insulin Syringe-Needle U-100 (BD INSULIN SYRINGE ULTRAFINE) 31G X 15/64" 0.5 ML MISC Use to inject three times a day 300 each 2  . latanoprost (XALATAN) 0.005 % ophthalmic solution Place 1 drop into both eyes at bedtime. 2.5 mL 3  . levocetirizine (XYZAL) 5 MG tablet Take 1 tablet (5 mg total) by mouth every evening. As needed for the hives/rash. 90 tablet 2  . LINZESS 290 MCG CAPS capsule Take 290 mcg by mouth daily as needed.     [name] losartan (COZAAR) 50 MG tablet Take 2 tablets (100 mg total) by mouth daily. 90 tablet 3  . metoCLOPramide (REGLAN) 5 MG tablet TAKE 1 TABLET BY MOUTH 4  TIMES DAILY 360 tablet 3  . metoprolol succinate (TOPROL-XL) 50 MG 24 hr tablet TAKE 3 TABLETS BY MOUTH  EVERY DAY WITH OR  IMMEDIATELY FOLLOWING A  MEAL 270 tablet 3  . montelukast (SINGULAIR) 10 MG tablet TAKE 1 TABLET BY MOUTH  DAILY 90 tablet 3  . Multiple Vitamins-Minerals (MULTIVITAMIN PO) Take 1 tablet by mouth daily.    [name] PROAIR HFA 108 (90 Base) MCG/ACT inhaler Inhale 2 puffs into the lungs every 4 (four) hours as needed for wheezing or shortness of breath. 54 g 1  . RESTASIS MULTIDOSE 0.05 % ophthalmic emulsion Place 1 drop into both eyes daily as needed.  3  . sertraline (ZOLOFT) 100 MG tablet Take 100 mg by mouth daily.    [name] spironolactone (ALDACTONE) 25 MG tablet TAKE 1 TABLET BY MOUTH  EVERY DAY BEFORE SUPPER 90 tablet 3  . traMADol (ULTRAM) 50 MG tablet TAKE 1-2 TABLETS BY MOUTH EVERY 8 HOURS AS NEEDED FOR PAIN. MAX 6 TABLETS PER 24 HOURS 180 tablet 5  . TRULICITY 1.5 ZP/9.1TA SOPN INJECT SUBCUTANEOUSLY 1.5  MG EVERY WEEK 6 mL 3   No current  facility-administered medications on file prior to visit.     Allergies  Allergen Reactions  . Bee Venom Hives and Swelling  . Propoxyphene Hcl Itching  . Amlodipine Besylate Swelling  . Hydrocodone Other (See Comments)    With Vicodin - makes patient "jittery", and "hangover effect - sleepy next day"  . Lisinopril Cough  Changed to ARB  . Metformin And Related Diarrhea    GI distress  . Metronidazole Other (See Comments)    REACTION: "just didn't work; my body never did heal from it"  . Valsartan Other (See Comments)    REACTION:  "sleep more the next day after I took it; it made me real tired"  . Xyzal [Levocetirizine Dihydrochloride] Rash    Objective: Calliope Delangel is a pleasant 69 y.o. y.o. Patient Race: Black or African American [2] Unavailable [8]  female in NAD. AAO x 3.  There were no vitals filed for this visit.  Vascular Examination: Neurovascular status unchanged b/l lower extremities. Capillary refill time to digits immediate b/l. Palpable DP pulses b/l. Palpable PT pulses b/l. Pedal hair absent b/l. Skin temperature gradient within normal limits b/l.  Dermatological Examination: Pedal skin with normal turgor, texture and tone bilaterally. No open wounds bilaterally. No interdigital macerations bilaterally. Toenails 1-5 b/l elongated, discolored, dystrophic, thickened, crumbly with subungual debris and tenderness to dorsal palpation. Hyperkeratotic lesion(s) submet head 1 right foot, submet head 4 left foot and plantar aspect b/l heel pads.  No erythema, no edema, no drainage, no flocculence.  Musculoskeletal: Normal muscle strength 5/5 to all lower extremity muscle groups bilaterally. No pain crepitus or joint limitation noted with ROM b/l. Hammertoes noted to the 2-5 bilaterally. Pes planus deformity noted b/l.  Utilizes rollator for ambulation assistance.  Neurological Examination: Protective sensation intact 5/5 intact bilaterally with 10g  monofilament b/l. Vibratory sensation intact b/l. Proprioception intact bilaterally.  Assessment: 1. Pain due to onychomycosis of toenails of both feet   2. Callus   3. Diabetic peripheral neuropathy associated with type 2 diabetes mellitus ([place])    Plan: -Examined patient. -Continue diabetic foot care principles. -Toenails 1-5 b/l were debrided in length and girth with sterile nail nippers and dremel without iatrogenic bleeding.  -Callus(es) submet head 1 right foot and submet head 4 left foot pared utilizing sterile scalpel blade without complication or incident. Total number debrided =2. -Patient to report any pedal injuries to medical professional immediately. -Patient to continue soft, supportive shoe gear daily. -Patient/POA to call should there be question/concern in the interim.  Return in about 3 months (around 05/11/2020) for diabetic nail and callus trim.  Marzetta Board, DPM

## 2020-02-18 ENCOUNTER — Ambulatory Visit: Payer: Self-pay

## 2020-02-18 ENCOUNTER — Ambulatory Visit (INDEPENDENT_AMBULATORY_CARE_PROVIDER_SITE_OTHER): Payer: Medicare Other | Admitting: Family Medicine

## 2020-02-18 ENCOUNTER — Other Ambulatory Visit: Payer: Self-pay

## 2020-02-18 VITALS — BP 187/78 | Ht 61.0 in | Wt 190.0 lb

## 2020-02-18 DIAGNOSIS — M25552 Pain in left hip: Secondary | ICD-10-CM | POA: Diagnosis not present

## 2020-02-18 MED ORDER — METHYLPREDNISOLONE ACETATE 40 MG/ML IJ SUSP
40.0000 mg | Freq: Once | INTRAMUSCULAR | Status: AC
  Administered 2020-02-18: 40 mg via INTRA_ARTICULAR

## 2020-02-18 NOTE — Progress Notes (Signed)
  [name] [name] - 69 y.o. female MRN [id number]  Date of birth: 07-10-1951    SUBJECTIVE:      Chief Complaint:/ HPI:  Left hip pain.  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  She had recent x-ray which did not show any obvious structural change.  I discussed with her the options which are limited.  I do think relatively soon she is going to have to have total hip replacement.  She wants to try at least 1 steroid injection into the hip joint to see if she can get some relief.    OBJECTIVE: BP (!) 187/78   Ht 5\' 1"  (1.549 m)   Wt 190 lb (86.2 kg)   BMI 35.90 kg/m   Physical Exam:  Vital signs are reviewed. Limited range of motion bilateral hips particularly in external rotation. Patient legally blind. PROCEDURE: INJECTION: Patient was given informed consent, signed copy in the chart. Appropriate time out was taken. Area prepped and draped in usual sterile fashion. Ethyl chloride was  used for local anesthesia. A 21 gauge 1 1/2 inch needle was used.. 1 cc of methylprednisolone 40 mg/ml plus 4 cc of 1% lidocaine without epinephrine was injected into the left hip joint using a(n) anterior lateral and ultrasound-guided approach.   The patient tolerated the procedure well. There were no complications. Post procedure instructions were given.   ASSESSMENT & PLAN:  See problem based charting & AVS for pt instructions. No problem-specific Assessment & Plan notes found for this encounter.

## 2020-02-18 NOTE — Assessment & Plan Note (Signed)
Very likely needs to consider total hip replacement and I have scheduled her to see an orthopedist.  In the interim, we will try for some pain relief with ultrasound-guided corticosteroid injection into the hip joint.  I suspect there is left than 50% chance this will be beneficial to her but she really wants to attempt it.

## 2020-02-21 NOTE — Patient Instructions (Signed)
[address] Dorna Leitz Thursday July 1st at [place] time is 1030a Make sure you bring a mask and your insurance card [id number] [place],  [address] Phone: [phone]

## 2020-02-22 ENCOUNTER — Telehealth: Payer: Self-pay | Admitting: Family Medicine

## 2020-02-22 NOTE — Telephone Encounter (Signed)
Patient called request to speak w/ [place] regarding a referral appt .  pls call her [phone]  --glh

## 2020-02-25 DIAGNOSIS — H401133 Primary open-angle glaucoma, bilateral, severe stage: Secondary | ICD-10-CM | POA: Diagnosis not present

## 2020-03-02 DIAGNOSIS — M7062 Trochanteric bursitis, left hip: Secondary | ICD-10-CM | POA: Diagnosis not present

## 2020-03-02 DIAGNOSIS — M545 Low back pain: Secondary | ICD-10-CM | POA: Diagnosis not present

## 2020-03-02 DIAGNOSIS — M25552 Pain in left hip: Secondary | ICD-10-CM | POA: Diagnosis not present

## 2020-03-07 ENCOUNTER — Other Ambulatory Visit: Payer: Self-pay | Admitting: Allergy

## 2020-04-04 ENCOUNTER — Other Ambulatory Visit (HOSPITAL_COMMUNITY): Payer: Self-pay | Admitting: Psychiatry

## 2020-04-04 DIAGNOSIS — F411 Generalized anxiety disorder: Secondary | ICD-10-CM

## 2020-04-04 DIAGNOSIS — F330 Major depressive disorder, recurrent, mild: Secondary | ICD-10-CM

## 2020-04-27 ENCOUNTER — Other Ambulatory Visit: Payer: Self-pay

## 2020-04-27 ENCOUNTER — Telehealth (INDEPENDENT_AMBULATORY_CARE_PROVIDER_SITE_OTHER): Payer: Medicare Other | Admitting: Psychiatry

## 2020-04-27 DIAGNOSIS — M5416 Radiculopathy, lumbar region: Secondary | ICD-10-CM

## 2020-04-27 DIAGNOSIS — F330 Major depressive disorder, recurrent, mild: Secondary | ICD-10-CM | POA: Diagnosis not present

## 2020-04-27 DIAGNOSIS — F411 Generalized anxiety disorder: Secondary | ICD-10-CM

## 2020-04-27 MED ORDER — DULOXETINE HCL 60 MG PO CPEP: 60.0000 mg | ORAL_CAPSULE | Freq: Every day | ORAL | 0 refills | Status: DC

## 2020-04-27 MED ORDER — BUPROPION HCL 100 MG PO TABS: 100.0000 mg | ORAL_TABLET | Freq: Two times a day (BID) | ORAL | 0 refills | Status: DC

## 2020-04-27 NOTE — Progress Notes (Signed)
Virtual Visit via Telephone Note  I connected with [name] [name] on 04/27/20 at 11:30 AM EDT by telephone and verified that I am speaking with the correct person using two identifiers.  Location: Patient: home Provider: office   I discussed the limitations, risks, security and privacy concerns of performing an evaluation and management service by telephone and the availability of in person appointments. I also discussed with the patient that there may be a patient responsible charge related to this service. The patient expressed understanding and agreed to proceed.   History of Present Illness: [name] [name] is having on/off issues with anxiety. About 1-2x/week she is unable to sleep well due to racing thoughts. During the daytime she is busy so she doesn't notice the anxiety. Most nights she is sleeping well. [name] [name] sister is very sick and she is very worried about her. [name] [name] is sad about not being able to visit her. She denies SI/HI.    Observations/Objective:  General Appearance: unable to assess  Eye Contact:  unable to assess  Speech:  Clear and Coherent and Normal Rate  Volume:  Normal  Mood:  Anxious and Depressed  Affect:  Congruent  Thought Process:  Goal Directed, Linear and Descriptions of Associations: Intact  Orientation:  Full (Time, Place, and Person)  Thought Content:  Logical  Suicidal Thoughts:  No  Homicidal Thoughts:  No  Memory:  Immediate;   Good  Judgement:  Good  Insight:  Good  Psychomotor Activity: unable to assess  Concentration:  Concentration: Good  Recall:  Good  Fund of Knowledge:  Good  Language:  Good  Akathisia:  unable to assess  Handed:  Right  AIMS (if indicated):     Assets:  Communication Skills Desire for Improvement Financial Resources/Insurance Housing Resilience Talents/Skills Transportation Vocational/Educational  ADL's:  unable to assess  Cognition:  WNL  Sleep:         Assessment and Plan: GAD; MDD-  recurrent, mild  D/c Zoloft 100mg  po qD- stopped it when her PCP started it a few months ago  Wellbutrin SR 100mg  po BID  Increase Cymbalta 60mg  po qD   Follow Up Instructions: In 2-3 months or sooner if needed   I discussed the assessment and treatment plan with the patient. The patient was provided an opportunity to ask questions and all were answered. The patient agreed with the plan and demonstrated an understanding of the instructions.   The patient was advised to call back or seek an in-person evaluation if the symptoms worsen or if the condition fails to improve as anticipated.  I provided 15 minutes of non-face-to-face time during this encounter.   Charlcie Cradle, MD

## 2020-05-08 ENCOUNTER — Other Ambulatory Visit: Payer: Self-pay | Admitting: Allergy

## 2020-05-16 NOTE — Progress Notes (Deleted)
Follow Up Note  RE: [name] [name] MRN: [id number] DOB: 06/05/1951 Date of Office Visit: 05/17/2020  Referring provider: Dickie La, MD Primary care provider: Dickie La, MD  Chief Complaint: No chief complaint on file.  History of Present Illness: I had the pleasure of seeing [name] [name] for a follow up visit at the [facility] on 05/16/2020. She is a 69 y.o. female, who is being followed for asthma, allergic rhinitis, LPRD, bee sting allergy. Her previous allergy office visit was on 12/15/2019 with Dr. Maudie Mercury. Today is a regular follow up visit.  Moderate persistent asthma without complication Today's ACT score was 25. Asthma is doing well.  Uses albuterol 1-2 times a week for exertion related dyspnea with good benefit.  Today's spirometry was unremarkable.  Daily controller medication(s):continue Symbicort 160 2 puffs twice a day with spacer and rinse mouth afterwards. ? Continue Singulair 10mg  daily   Prior to physical activity:May use albuterol rescue inhaler 2 puffs 5 to 15 minutes prior to strenuous physical activities.  Rescue medications:May use albuterol rescue inhaler 2 puffs or nebulizer every 4 to 6 hours as needed for shortness of breath, chest tightness, coughing, and wheezing. Monitor frequency of use.  Seasonal and perennial allergic rhinitis Past history - 2018 skin testing was positive to dust mites and tree pollen. Interim history - complaining of dry nasal passages.  Continue environmental control measures.  Use Flonase 1 spray twice a day.  Stop Azelastine and only use if having issues with drainage or runny nose.  ? May use azelastine nasal spray 1-2 sprays per nostril twice a day as needed for drainage.  Nasal saline spray (i.e., Simply Saline) or nasal saline lavage (i.e., NeilMed) is recommended as needed and prior to medicated nasal sprays.  May use over the counter antihistamines such as Zyrtec (cetirizine),  Claritin (loratadine), Allegra (fexofenadine), or Xyzal (levocetirizine) daily as needed.  LPRD (laryngopharyngeal reflux disease) Stable.   ContinueNexium40mg  twice a day.   Continue Pepcid 40mg  daily.  Bee sting allergy No stings since last visit.   For mild symptoms you can take over the counter antihistamines such as Benadryl and monitor symptoms closely. If symptoms worsen or if you have severe symptoms including breathing issues, throat closure, significant swelling, whole body hives, severe diarrhea and vomiting, lightheadedness then inject epinephrine and seek immediate medical care afterwards.  Consider testing in future.  Assessment and Plan: [name] [name] is a 70 y.o. female with: No problem-specific Assessment & Plan notes found for this encounter.  No follow-ups on file.  No orders of the defined types were placed in this encounter.  Lab Orders  No laboratory test(s) ordered today    Diagnostics: Spirometry:  Tracings reviewed. Her effort: {Blank single:19197::"Good reproducible efforts.","It was hard to get consistent efforts and there is a question as to whether this reflects a maximal maneuver.","Poor effort, data can not be interpreted."} FVC: ***L FEV1: ***L, ***% predicted FEV1/FVC ratio: ***% Interpretation: {Blank single:19197::"Spirometry consistent with mild obstructive disease","Spirometry consistent with moderate obstructive disease","Spirometry consistent with severe obstructive disease","Spirometry consistent with possible restrictive disease","Spirometry consistent with mixed obstructive and restrictive disease","Spirometry uninterpretable due to technique","Spirometry consistent with normal pattern","No overt abnormalities noted given today's efforts"}.  Please see scanned spirometry results for details.  Skin Testing: {Blank single:19197::"Select foods","Environmental allergy panel","Environmental allergy panel and select foods","Food allergy  panel","None","Deferred due to recent antihistamines use"}. Positive test to: ***. Negative test to: ***.  Results discussed with patient/family.   Medication List:  Current Outpatient Medications  Medication Sig Dispense Refill  . ACCU-CHEK FASTCLIX LANCETS MISC TEST THREE TIMES DAILY 306 each 12  . acyclovir (ZOVIRAX) 200 MG capsule TAKE 1 CAPSULE BY MOUTH TWO TIMES DAILY 180 capsule 3  . albuterol (VENTOLIN HFA) 108 (90 Base) MCG/ACT inhaler USE 2 PUFFS BY MOUTH INTO  THE LUNGS EVERY 4 HOURS AS  NEEDED FOR WHEEZING OR  SHORTNESS OF BREATH 51 g 1  . Alcohol Swabs (B-D SINGLE USE SWABS REGULAR) PADS USE THREE TIMES DAILY 300 each 3  . allopurinol (ZYLOPRIM) 300 MG tablet TAKE 1 TABLET BY MOUTH  DAILY 90 tablet 3  . ammonium lactate (LAC-HYDRIN) 12 % lotion APPLY 1 APPLICATION TOPICALLY TWICE DAILY AS NEEDED FOR DRY SKIN (SUBSTITUTED FOR LAC HYDRIN) 400 g 12  . aspirin EC 81 MG tablet Take 81 mg by mouth daily.    [name] atorvastatin (LIPITOR) 40 MG tablet TAKE 1 TABLET BY MOUTH  EVERY DAY 90 tablet 3  . azelastine (ASTELIN) 0.1 % nasal spray USE 2 SPRAYS IN EACH NOSTRIL EVERY DAY AS NEEDED AS DIRECTED (Patient not taking: Reported on 12/30/2019) 60 mL 3  . B-D ULTRAFINE III SHORT PEN 31G X 8 MM MISC USE TO INJECT THREE TIMES DAILY 270 each 3  . bisacodyl (DULCOLAX) 10 MG suppository Place 1 suppository (10 mg total) rectally as needed for moderate constipation. 12 suppository 0  . budesonide-formoterol (SYMBICORT) 160-4.5 MCG/ACT inhaler USE 2 INHALATIONS BY MOUTH  TWICE DAILY - RINSE,  GARGLE, AND SPIT AFTER USE 30.6 g 3  . buPROPion (WELLBUTRIN) 100 MG tablet Take 1 tablet (100 mg total) by mouth 2 (two) times daily. 180 tablet 0  . COMBIGAN 0.2-0.5 % ophthalmic solution INSTILL 1 DROP INTO THE  LEFT EYE EVERY 12 HOURS 5 mL 12  . DULoxetine (CYMBALTA) 60 MG capsule Take 1 capsule (60 mg total) by mouth daily. 90 capsule 0  . EPINEPHrine 0.3 mg/0.3 mL IJ SOAJ injection Inject 0.3 mLs (0.3 mg  total) into the muscle once as needed (for allergic reaction). (Patient not taking: Reported on 12/30/2019) 1 Device 3  . esomeprazole (NEXIUM) 40 MG capsule TAKE 1 CAPSULE BY MOUTH  DAILY 90 capsule 3  . ezetimibe (ZETIA) 10 MG tablet TAKE 1 TABLET BY MOUTH  DAILY 90 tablet 3  . famotidine (PEPCID) 40 MG tablet TAKE 1 TABLET BY MOUTH  DAILY 90 tablet 0  . fluticasone (FLONASE) 50 MCG/ACT nasal spray Place 1 spray into both nostrils in the morning and at bedtime. 48 g 3  . furosemide (LASIX) 40 MG tablet Take one tablet by mouth daily 90 tablet 3  . glucose blood (PRODIGY NO CODING BLOOD GLUC) test strip USE AS INSTRUCTED TO CHECK BLOOD SUGAR THREE TIMES A DAY 300 each 12  . HUMALOG KWIKPEN 100 UNIT/ML KwikPen SUBCUTANEOUSLY INJECT 35  UNITS 2 TIMES DAILY AFTER  MEALS AS DIRECTED 75 mL 3  . Insulin Glargine (LANTUS SOLOSTAR) 100 UNIT/ML Solostar Pen INJECT SUBCUTANEOUSLY 35  UNITS TWO TIMES DAILY 75 mL 3  . Insulin Syringe-Needle U-100 (BD INSULIN SYRINGE ULTRAFINE) 31G X 15/64" 0.5 ML MISC Use to inject three times a day 300 each 2  . latanoprost (XALATAN) 0.005 % ophthalmic solution Place 1 drop into both eyes at bedtime. 2.5 mL 3  . levocetirizine (XYZAL) 5 MG tablet Take 1 tablet (5 mg total) by mouth every evening. As needed for the hives/rash. 90 tablet 2  . LINZESS 290 MCG CAPS capsule Take 290 mcg by  mouth daily as needed.     [name] losartan (COZAAR) 50 MG tablet Take 2 tablets (100 mg total) by mouth daily. 90 tablet 3  . metoCLOPramide (REGLAN) 5 MG tablet TAKE 1 TABLET BY MOUTH 4  TIMES DAILY 360 tablet 3  . metoprolol succinate (TOPROL-XL) 50 MG 24 hr tablet TAKE 3 TABLETS BY MOUTH  EVERY DAY WITH OR  IMMEDIATELY FOLLOWING A  MEAL 270 tablet 3  . montelukast (SINGULAIR) 10 MG tablet TAKE 1 TABLET BY MOUTH  DAILY 90 tablet 3  . Multiple Vitamins-Minerals (MULTIVITAMIN PO) Take 1 tablet by mouth daily.    [name] PROAIR HFA 108 (90 Base) MCG/ACT inhaler USE 2 INHALATIONS BY MOUTH  EVERY 4 HOURS AS  NEEDED FOR WHEEZING OR SHORTNESS OF  BREATH 51 g 0  . RESTASIS MULTIDOSE 0.05 % ophthalmic emulsion Place 1 drop into both eyes daily as needed.  3  . spironolactone (ALDACTONE) 25 MG tablet TAKE 1 TABLET BY MOUTH  EVERY DAY BEFORE SUPPER 90 tablet 3  . traMADol (ULTRAM) 50 MG tablet TAKE 1-2 TABLETS BY MOUTH EVERY 8 HOURS AS NEEDED FOR PAIN. MAX 6 TABLETS PER 24 HOURS 180 tablet 5  . TRULICITY 1.5 DD/2.2GU SOPN INJECT SUBCUTANEOUSLY 1.5  MG EVERY WEEK 6 mL 3   No current facility-administered medications for this visit.   Allergies: Allergies  Allergen Reactions  . Bee Venom Hives and Swelling  . Propoxyphene Hcl Itching  . Amlodipine Besylate Swelling  . Hydrocodone Other (See Comments)    With Vicodin - makes patient "jittery", and "hangover effect - sleepy next day"  . Lisinopril Cough    Changed to ARB  . Metformin And Related Diarrhea    GI distress  . Metronidazole Other (See Comments)    REACTION: "just didn't work; my body never did heal from it"  . Valsartan Other (See Comments)    REACTION:  "sleep more the next day after I took it; it made me real tired"  . Xyzal [Levocetirizine Dihydrochloride] Rash   I reviewed her past medical history, social history, family history, and environmental history and no significant changes have been reported from her previous visit.  Review of Systems  Constitutional: Negative for appetite change, chills, fever and unexpected weight change.  HENT: Negative for congestion and rhinorrhea.   Eyes: Negative for itching.  Respiratory: Negative for cough, chest tightness, shortness of breath and wheezing.   Gastrointestinal: Negative for abdominal pain.  Skin: Negative for rash.  Allergic/Immunologic: Positive for environmental allergies. Negative for food allergies.  Neurological: Negative for headaches.   Objective: There were no vitals taken for this visit. There is no height or weight on file to calculate BMI. Physical Exam Vitals  and nursing note reviewed.  Constitutional:      Appearance: She is well-developed.  HENT:     Head: Normocephalic and atraumatic.     Right Ear: External ear normal.     Left Ear: External ear normal.     Nose: Nose normal.     Mouth/Throat:     Mouth: Mucous membranes are moist.     Pharynx: Oropharynx is clear.  Eyes:     Conjunctiva/sclera: Conjunctivae normal.  Cardiovascular:     Rate and Rhythm: Normal rate and regular rhythm.     Heart sounds: Normal heart sounds. No murmur heard.  No friction rub. No gallop.   Pulmonary:     Effort: Pulmonary effort is normal.     Breath sounds: Normal breath sounds.  No wheezing or rales.  Musculoskeletal:     Cervical back: Neck supple.  Skin:    General: Skin is warm.     Findings: No rash.  Neurological:     Mental Status: She is alert and oriented to person, place, and time.  Psychiatric:        Behavior: Behavior normal.    Previous notes and tests were reviewed. The plan was reviewed with the patient/family, and all questions/concerned were addressed.  It was my pleasure to see [name] [name] today and participate in her care. Please feel free to contact me with any questions or concerns.  Sincerely,  Rexene Alberts, DO Allergy & Immunology  [facility] office: [phone] [facility] office: [place] office: [phone]

## 2020-05-17 ENCOUNTER — Ambulatory Visit: Payer: Medicare Other | Admitting: Allergy

## 2020-05-19 ENCOUNTER — Ambulatory Visit: Payer: Medicare Other | Admitting: Podiatry

## 2020-05-21 ENCOUNTER — Other Ambulatory Visit: Payer: Self-pay | Admitting: Family Medicine

## 2020-05-21 DIAGNOSIS — E1149 Type 2 diabetes mellitus with other diabetic neurological complication: Secondary | ICD-10-CM

## 2020-05-24 ENCOUNTER — Ambulatory Visit: Payer: Medicare Other | Admitting: Family Medicine

## 2020-06-14 ENCOUNTER — Other Ambulatory Visit: Payer: Self-pay

## 2020-06-14 ENCOUNTER — Encounter: Payer: Self-pay | Admitting: Family Medicine

## 2020-06-14 ENCOUNTER — Ambulatory Visit (INDEPENDENT_AMBULATORY_CARE_PROVIDER_SITE_OTHER): Payer: Medicare Other | Admitting: Family Medicine

## 2020-06-14 VITALS — BP 128/78 | HR 78 | Ht 61.0 in | Wt 189.2 lb

## 2020-06-14 DIAGNOSIS — Z23 Encounter for immunization: Secondary | ICD-10-CM

## 2020-06-14 DIAGNOSIS — E782 Mixed hyperlipidemia: Secondary | ICD-10-CM

## 2020-06-14 DIAGNOSIS — M4322 Fusion of spine, cervical region: Secondary | ICD-10-CM | POA: Diagnosis not present

## 2020-06-14 DIAGNOSIS — E1149 Type 2 diabetes mellitus with other diabetic neurological complication: Secondary | ICD-10-CM | POA: Diagnosis not present

## 2020-06-14 DIAGNOSIS — R21 Rash and other nonspecific skin eruption: Secondary | ICD-10-CM | POA: Diagnosis not present

## 2020-06-14 LAB — POCT GLYCOSYLATED HEMOGLOBIN (HGB A1C): HbA1c, POC (controlled diabetic range): 6.4 % (ref 0.0–7.0)

## 2020-06-14 MED ORDER — PERMETHRIN 5 % EX CREA: TOPICAL_CREAM | CUTANEOUS | 0 refills | Status: DC

## 2020-06-14 NOTE — Assessment & Plan Note (Signed)
Discussed she WILL have chronic neck pain from this. I see no signs or symptoms worrisome for complication of fusion. Seems muscular.

## 2020-06-14 NOTE — Assessment & Plan Note (Signed)
Seems like this is sequelae of insect issue, whether that was initially scabies or bedbugs bot sure. Will treat with permethrin and she will let me know if it is not working. We have previously discussed eradication bed bugs and I think she has done as much as she can re that,

## 2020-06-14 NOTE — Progress Notes (Signed)
    CHIEF COMPLAINT / HPI:  1. F/u right wrist pain and swelling. Pain is intermittent, some days bad some days OK. Diffuse in wrist. No redness. Has swollen "area" on dorsum of distal forearm; this has not changed. She wants to knowif this is why her wrist hurts. 2. Daily neck pain; since her neck fusion. Worse some days but not incapacitating. No UE weakness or numbness.  3. Rash and pruritis. She had bedbugs and thinks she is finally rid of them but notes some new areas of red marks and itchiness every few days.Onlyon UE and chest/neck and lower scalp. Not on her legs.   PERTINENT  PMH / PSH: I have reviewed the patient's medications, allergies, past medical and surgical history, smoking status and updated in the EMR as appropriate.   OBJECTIVE:  BP 128/78   Pulse 78   Ht 5\' 1"  (1.549 m)   Wt 189 lb 3.2 oz (85.8 kg)   SpO2 97%   BMI 35.75 kg/m  GENERAL: Well developed, well nourished and no acute distress. RESPIRATORY: respiratory rate and effort normal CARDIOVASCULAR: RRR. MSK: Normal gait, normal muscle bulk and tone. Right wrist FROm, nontender. 2 cm diffuse area on dorsum of distal forearm, soft, consistent with lipoma. PSYCH: Alert and oriented. Normal speech fluency and content. Asks and answers questions appropriately. No agitation noted. NEURO: blind. Resting nystagmus. Walks with rolling walker. Rises from a chair without assistance. Gait is antalgic and wider based and using walker (rolling) SKIN: several erythematous macules, mostly round, scattered on arms and upper chest. Not nodular. Not tender. No surrounding swelling. Size abut 2-3 mm. Non pustular. Skin is otherwise normal without dryness. I see no vesicles and really no excoriations.  ASSESSMENT / PLAN:  1. Right wrist pain--I think the small lipoma is incidental to her wrist issues and her pain is from her arthritis. Will follow the lipoma.  Diabetes mellitus type 2 with neurological manifestations (HCC) Excellent  A1C today. Check other labs F/u 3 m  Fusion of spine of cervical region Discussed she WILL have chronic neck pain from this. I see no signs or symptoms worrisome for complication of fusion. Seems muscular.  Rash and nonspecific skin eruption Seems like this is sequelae of insect issue, whether that was initially scabies or bedbugs bot sure. Will treat with permethrin and she will let me know if it is not working. We have previously discussed eradication bed bugs and I think she has done as much as she can re that,   [name] MD

## 2020-06-14 NOTE — Assessment & Plan Note (Signed)
Excellent A1C today. Check other labs F/u 3 m

## 2020-06-15 LAB — COMPREHENSIVE METABOLIC PANEL
ALT: 25 IU/L (ref 0–32)
AST: 25 IU/L (ref 0–40)
Albumin/Globulin Ratio: 1.6 (ref 1.2–2.2)
Albumin: 4.1 g/dL (ref 3.8–4.8)
Alkaline Phosphatase: 118 IU/L (ref 44–121)
BUN/Creatinine Ratio: 13 (ref 12–28)
BUN: 11 mg/dL (ref 8–27)
Bilirubin Total: 0.3 mg/dL (ref 0.0–1.2)
CO2: 28 mmol/L (ref 20–29)
Calcium: 9.5 mg/dL (ref 8.7–10.3)
Chloride: 106 mmol/L (ref 96–106)
Creatinine, Ser: 0.83 mg/dL (ref 0.57–1.00)
GFR calc Af Amer: 84 mL/min/{1.73_m2} (ref >59)
GFR calc non Af Amer: 73 mL/min/{1.73_m2} (ref >59)
Globulin, Total: 2.5 g/dL (ref 1.5–4.5)
Glucose: 170 mg/dL — ABNORMAL HIGH (ref 65–99)
Potassium: 4.3 mmol/L (ref 3.5–5.2)
Sodium: 146 mmol/L — ABNORMAL HIGH (ref 134–144)
Total Protein: 6.6 g/dL (ref 6.0–8.5)

## 2020-06-15 LAB — LIPID PANEL
Chol/HDL Ratio: 2.7 ratio (ref 0.0–4.4)
Cholesterol, Total: 117 mg/dL (ref 100–199)
HDL: 43 mg/dL (ref >39)
LDL Chol Calc (NIH): 58 mg/dL (ref 0–99)
Triglycerides: 79 mg/dL (ref 0–149)
VLDL Cholesterol Cal: 16 mg/dL (ref 5–40)

## 2020-06-20 ENCOUNTER — Other Ambulatory Visit: Payer: Self-pay | Admitting: Family Medicine

## 2020-06-20 ENCOUNTER — Encounter: Payer: Self-pay | Admitting: Family Medicine

## 2020-06-20 DIAGNOSIS — M5416 Radiculopathy, lumbar region: Secondary | ICD-10-CM

## 2020-06-28 ENCOUNTER — Other Ambulatory Visit (HOSPITAL_COMMUNITY): Payer: Self-pay | Admitting: Psychiatry

## 2020-06-28 DIAGNOSIS — F330 Major depressive disorder, recurrent, mild: Secondary | ICD-10-CM

## 2020-06-28 DIAGNOSIS — F411 Generalized anxiety disorder: Secondary | ICD-10-CM

## 2020-06-29 ENCOUNTER — Telehealth: Payer: Self-pay

## 2020-06-29 ENCOUNTER — Telehealth (HOSPITAL_COMMUNITY): Payer: Medicare Other | Admitting: Psychiatry

## 2020-06-29 ENCOUNTER — Other Ambulatory Visit: Payer: Self-pay

## 2020-06-29 NOTE — Telephone Encounter (Signed)
Patient calls nurse line to discuss medication management with Dr. Nori Riis. Patient reports that Premarin skin cream is not helping rash. Patient states that she is continuing to have rash covering arms, neck and upper back. Denies fever or other sick symptoms.   Patient also wanted to make provider aware that [place] card given to her at last visit had $0 value at the store.   To PCP  Please advise next steps for patient.   Talbot Grumbling, RN

## 2020-07-27 ENCOUNTER — Other Ambulatory Visit: Payer: Self-pay | Admitting: Allergy

## 2020-07-27 ENCOUNTER — Other Ambulatory Visit: Payer: Self-pay | Admitting: Family Medicine

## 2020-08-10 NOTE — Progress Notes (Signed)
Cardiology Office Note   Date:  08/11/2020   ID:  [name] [name] 09/18/1950, MRN [id number]  PCP:  [name] La, MD  Cardiologist:   [name] Carnes, MD   Pt presents for follow up of HTN    History of Present Illness: [name] [name] is a 69 y.o. female with a history of HTN and HL and aortic sclerosis   Also hx short burst PAT  I saw the pt back in March 2021   Since then pt denies palpitations.  DeniesCP Breathing is OK  Current Meds  Medication Sig  . ACCU-CHEK FASTCLIX LANCETS MISC TEST THREE TIMES DAILY  . acyclovir (ZOVIRAX) 200 MG capsule TAKE 1 CAPSULE BY MOUTH TWO TIMES DAILY  . albuterol (VENTOLIN HFA) 108 (90 Base) MCG/ACT inhaler USE 2 PUFFS BY MOUTH INTO  THE LUNGS EVERY 4 HOURS AS  NEEDED FOR WHEEZING OR  SHORTNESS OF BREATH  . Alcohol Swabs (B-D SINGLE USE SWABS REGULAR) PADS USE THREE TIMES DAILY  . allopurinol (ZYLOPRIM) 300 MG tablet TAKE 1 TABLET BY MOUTH  DAILY  . ammonium lactate (LAC-HYDRIN) 12 % lotion APPLY 1 APPLICATION TOPICALLY TWICE DAILY AS NEEDED FOR DRY SKIN (SUBSTITUTED FOR LAC HYDRIN)  . aspirin EC 81 MG tablet Take 81 mg by mouth daily.  [name] atorvastatin (LIPITOR) 40 MG tablet TAKE 1 TABLET BY MOUTH  DAILY  . azelastine (ASTELIN) 0.1 % nasal spray USE 2 SPRAYS IN EACH NOSTRIL EVERY DAY AS NEEDED AS DIRECTED  . B-D ULTRAFINE III SHORT PEN 31G X 8 MM MISC USE TO INJECT THREE TIMES DAILY  . bisacodyl (DULCOLAX) 10 MG suppository Place 1 suppository (10 mg total) rectally as needed for moderate constipation.  . budesonide-formoterol (SYMBICORT) 160-4.5 MCG/ACT inhaler USE 2 INHALATIONS BY MOUTH  TWICE DAILY - RINSE,  GARGLE, AND SPIT AFTER USE  . buPROPion (WELLBUTRIN) 100 MG tablet TAKE 1 TABLET BY MOUTH  TWICE DAILY  . COMBIGAN 0.2-0.5 % ophthalmic solution INSTILL 1 DROP INTO THE  LEFT EYE EVERY 12 HOURS  . DULoxetine (CYMBALTA) 60 MG capsule TAKE 1 CAPSULE BY MOUTH  DAILY  . EPINEPHrine 0.3 mg/0.3 mL IJ SOAJ injection Inject 0.3  mLs (0.3 mg total) into the muscle once as needed (for allergic reaction).  [name] esomeprazole (NEXIUM) 40 MG capsule TAKE 1 CAPSULE BY MOUTH  DAILY  . ezetimibe (ZETIA) 10 MG tablet TAKE 1 TABLET BY MOUTH  DAILY  . famotidine (PEPCID) 40 MG tablet TAKE 1 TABLET BY MOUTH  DAILY  . fluticasone (FLONASE) 50 MCG/ACT nasal spray Place 1 spray into both nostrils in the morning and at bedtime.  . furosemide (LASIX) 40 MG tablet Take one tablet by mouth daily  . glucose blood (PRODIGY NO CODING BLOOD GLUC) test strip USE AS INSTRUCTED TO CHECK BLOOD SUGAR THREE TIMES A DAY  . HUMALOG KWIKPEN 100 UNIT/ML KwikPen SUBCUTANEOUSLY INJECT 35  UNITS 2 TIMES DAILY AFTER  MEALS AS DIRECTED  . insulin glargine (LANTUS SOLOSTAR) 100 UNIT/ML Solostar Pen INJECT SUBCUTANEOUSLY 35  UNITS TWICE DAILY  . Insulin Syringe-Needle U-100 (BD INSULIN SYRINGE ULTRAFINE) 31G X 15/64" 0.5 ML MISC Use to inject three times a day  . latanoprost (XALATAN) 0.005 % ophthalmic solution Place 1 drop into both eyes at bedtime.  [name] levocetirizine (XYZAL) 5 MG tablet Take 1 tablet (5 mg total) by mouth every evening. As needed for the hives/rash.  [name] LINZESS 290 MCG CAPS capsule Take 290 mcg by mouth daily as needed.   [name]  losartan (COZAAR) 50 MG tablet Take 2 tablets (100 mg total) by mouth daily.  . metoCLOPramide (REGLAN) 5 MG tablet TAKE 1 TABLET BY MOUTH 4  TIMES DAILY  . metoprolol succinate (TOPROL-XL) 50 MG 24 hr tablet TAKE 3 TABLETS BY MOUTH  EVERY DAY WITH OR  IMMEDIATELY FOLLOWING A  MEAL  . montelukast (SINGULAIR) 10 MG tablet TAKE 1 TABLET BY MOUTH  DAILY  . Multiple Vitamins-Minerals (MULTIVITAMIN PO) Take 1 tablet by mouth daily.  . permethrin (ELIMITE) 5 % cream Apply from neck down to entire body except genital area and leave on fo0r 8 hours then rinse off and repeat in one week  . PROAIR HFA 108 (90 Base) MCG/ACT inhaler USE 2 INHALATIONS BY MOUTH  EVERY 4 HOURS AS NEEDED FOR WHEEZING OR SHORTNESS OF  BREATH  . RESTASIS  MULTIDOSE 0.05 % ophthalmic emulsion Place 1 drop into both eyes daily as needed.  [name] spironolactone (ALDACTONE) 25 MG tablet TAKE 1 TABLET BY MOUTH  DAILY BEFORE SUPPER  . traMADol (ULTRAM) 50 MG tablet TAKE 1-2 TABLETS BY MOUTH EVERY 8 HOURS AS NEEDED FOR PAIN. MAX 6 TABLETS PER 24 HOURS  . TRULICITY 1.5 TO/6.7TI SOPN INJECT SUBCUTANEOUSLY 1.5  MG EVERY WEEK     Allergies:   Bee venom, Propoxyphene hcl, Amlodipine besylate, Hydrocodone, Lisinopril, Metformin and related, Metronidazole, Valsartan, and Xyzal [levocetirizine dihydrochloride]   Past Medical History:  Diagnosis Date  . Allergy   . Angina   . Anxiety   . Arthritis   . Asthma   . Blood transfusion   . Blood transfusion without reported diagnosis   . Cataract   . Chronic cough    Now followed by pulmonary  . Chronic lower back pain   . Complication of anesthesia    "just wake up coughing; that's all"  . Concussion 11/18/11   "fell at dr's office; hit head"  . COPD (chronic obstructive pulmonary disease) ([place])   . Delayed gastric emptying   . Depression   . Diabetes mellitus type II    "take insulin & pills"  . Dyslipidemia   . Exertional dyspnea   . GERD (gastroesophageal reflux disease)   . Glaucoma   . Gout   . Headache(784.0) 11/18/11   "I've had mild headaches the last couple days"  . Headache(784.0) 01/08/12   "pretty constant since 11/18/11's concussion"  . Heart murmur   . History of colonoscopy   . HTN (hypertension)   . Myocardial infarction ([place])   . Osteoporosis   . Peripheral neuropathy   . Pneumonia    "i've had it once" (11/18/11)  . Sleep apnea    on CPAP 12  . Syncope 06/16/2012  . Vocal cord paralysis, unilateral partial    "right"    Past Surgical History:  Procedure Laterality Date  . ANTERIOR CERVICAL DECOMP/DISCECTOMY FUSION  01/15/2012   Procedure: ANTERIOR CERVICAL DECOMPRESSION/DISCECTOMY FUSION 3 LEVELS;  Surgeon: Eustace Moore, MD;  Location: [facility];  Service: Neurosurgery;   Laterality: N/A;  Anterior Cervical Decompression Discectomy Fusion Cerivcal three four, cervical four five, cervical five six, cervical six seven  . BREAST BIOPSY     bilaterally  . BREAST CYST EXCISION     right twice; left once  . BREAST REDUCTION SURGERY  04-21-2003  . BRONCHOSCOPY  07-2007  . CARDIAC CATHETERIZATION  03-02-2004  . CARPAL TUNNEL RELEASE     left  . CATARACT EXTRACTION  1955; 1979   bilateral; right eye  .  CESAREAN SECTION  1987  . COLONOSCOPY W/ BIOPSIES  11/21/2010   adenoma polyp--no hi grade dysplasia Dr Collene Mares  . EYE SURGERY    . KNEE ARTHROSCOPY  08/2000   right  . LEFT AND RIGHT HEART CATHETERIZATION WITH CORONARY ANGIOGRAM N/A 01/19/2014   Procedure: LEFT AND RIGHT HEART CATHETERIZATION WITH CORONARY ANGIOGRAM;  Surgeon: Sinclair Grooms, MD;  Location: [facility];  Service: Cardiovascular;  Laterality: N/A;  . REDUCTION MAMMAPLASTY Bilateral   . SHOULDER ARTHROSCOPY  08/2000   right  . SHOULDER ARTHROSCOPY W/ ROTATOR CUFF REPAIR  10/18/2003   left  . SPINE SURGERY    . T-score  09/02/04   -0.54 (low risk currently)  . TOTAL ABDOMINAL HYSTERECTOMY  10-07-2000  . TREATMENT FISTULA ANAL    . TUBAL LIGATION  1987     Social History:  The patient  reports that she quit smoking about 44 years ago. Her smoking use included cigarettes. She has a 0.25 pack-year smoking history. She has never used smokeless tobacco. She reports that she does not drink alcohol and does not use drugs.   Family History:  The patient's family history includes Cancer in her sister; Congestive Heart Failure in her brother; Diabetes in her father; Heart disease in her mother; Hyperlipidemia in her brother and sister; Hypertension in her brother, sister, and sister.    ROS:  Please see the history of present illness. All other systems are reviewed and  Negative to the above problem except as noted.    PHYSICAL EXAM: VS:  BP 136/76   Pulse 70   Ht 5\' 1"  (1.549 m)   Wt 191 lb (86.6 kg)    SpO2 98%   BMI 36.09 kg/m   GEN: Morbidly obese 69 yo , in no acute distress  Neck: no JVD Cardiac: RRR; NO signif murmurs   Respiratory:  clear to auscultation bilaterally, GI: soft, nontender, obese   MS: no deformity Moving all extremities   Skin: warm and dry, no rash    EKG:  EKG is not   ordered today.      Lipid Panel    Component Value Date/Time   CHOL 117 06/14/2020 1108   TRIG 79 06/14/2020 1108   HDL 43 06/14/2020 1108   CHOLHDL 2.7 06/14/2020 1108   CHOLHDL 3.7 10/20/2015 1212   VLDL 16 10/20/2015 1212   LDLCALC 58 06/14/2020 1108   LDLDIRECT 65 07/14/2019 1352   LDLDIRECT 104 09/11/2016 1152      Wt Readings from Last 3 Encounters:  08/11/20 191 lb (86.6 kg)  06/14/20 189 lb 3.2 oz (85.8 kg)  02/18/20 190 lb (86.2 kg)      ASSESSMENT AND PLAN: 1  HTN  BP is adequately controlled   Keep on current regimen  2  HL  Lipids were excellent on recent check  LDL58  HDL 43  3  Aortic sclerosis  Follow   4Hx of palp  Denies     4  Minimal CAD   Pt denies CP       5  Dyspnea Breathing is stable without mask    F/U in 9 to 10 months    Current medicines are reviewed at length with the patient today.  The patient does not have concerns regarding medicines.  Signed, [name] Carnes, MD  08/11/2020 3:06 PM    [facility] [address], [place], [place]  [address] Phone: [phone]; Fax: [phone]

## 2020-08-11 ENCOUNTER — Ambulatory Visit (INDEPENDENT_AMBULATORY_CARE_PROVIDER_SITE_OTHER): Payer: Medicare Other | Admitting: Internal Medicine

## 2020-08-11 ENCOUNTER — Other Ambulatory Visit: Payer: Self-pay

## 2020-08-11 ENCOUNTER — Encounter: Payer: Self-pay | Admitting: Internal Medicine

## 2020-08-11 VITALS — BP 136/76 | HR 70 | Ht 61.0 in | Wt 191.0 lb

## 2020-08-11 DIAGNOSIS — I10 Essential (primary) hypertension: Secondary | ICD-10-CM | POA: Diagnosis not present

## 2020-08-11 DIAGNOSIS — R002 Palpitations: Secondary | ICD-10-CM | POA: Diagnosis not present

## 2020-08-11 DIAGNOSIS — H401133 Primary open-angle glaucoma, bilateral, severe stage: Secondary | ICD-10-CM | POA: Diagnosis not present

## 2020-08-11 NOTE — Patient Instructions (Signed)
Medication Instructions:  No changes *If you need a refill on your cardiac medications before your next appointment, please call your pharmacy*   Lab Work: none If you have labs (blood work) drawn today and your tests are completely normal, you will receive your results only by: MyChart Message (if you have MyChart) OR A paper copy in the mail If you have any lab test that is abnormal or we need to change your treatment, we will call you to review the results.   Testing/Procedures: none   Follow-Up: At [facility], you and your health needs are our priority.  As part of our continuing mission to provide you with exceptional heart care, we have created designated Provider Care Teams.  These Care Teams include your primary Cardiologist (physician) and Advanced Practice Providers (APPs -  Physician Assistants and Nurse Practitioners) who all work together to provide you with the care you need, when you need it.  We recommend signing up for the patient portal called "MyChart".  Sign up information is provided on this After Visit Summary.  MyChart is used to connect with patients for Virtual Visits (Telemedicine).  Patients are able to view lab/test results, encounter notes, upcoming appointments, etc.  Non-urgent messages can be sent to your provider as well.   To learn more about what you can do with MyChart, go to [email or web address].    Your next appointment:   9 month(s)  The format for your next appointment:   In Person  Provider:   You may see Paula Ross, MD or one of the following Advanced Practice Providers on your designated Care Team:   Scott Weaver, PA-C Vin Bhagat, PA-C   Other Instructions   

## 2020-08-17 ENCOUNTER — Other Ambulatory Visit: Payer: Self-pay | Admitting: Family Medicine

## 2020-08-17 DIAGNOSIS — Z1231 Encounter for screening mammogram for malignant neoplasm of breast: Secondary | ICD-10-CM

## 2020-08-18 ENCOUNTER — Encounter: Payer: Self-pay | Admitting: Podiatry

## 2020-08-18 ENCOUNTER — Other Ambulatory Visit: Payer: Self-pay

## 2020-08-18 ENCOUNTER — Ambulatory Visit (INDEPENDENT_AMBULATORY_CARE_PROVIDER_SITE_OTHER): Payer: Medicare Other | Admitting: Podiatry

## 2020-08-18 DIAGNOSIS — M79674 Pain in right toe(s): Secondary | ICD-10-CM

## 2020-08-18 DIAGNOSIS — E1142 Type 2 diabetes mellitus with diabetic polyneuropathy: Secondary | ICD-10-CM

## 2020-08-18 DIAGNOSIS — B351 Tinea unguium: Secondary | ICD-10-CM | POA: Diagnosis not present

## 2020-08-18 DIAGNOSIS — L84 Corns and callosities: Secondary | ICD-10-CM | POA: Diagnosis not present

## 2020-08-18 DIAGNOSIS — M79675 Pain in left toe(s): Secondary | ICD-10-CM | POA: Diagnosis not present

## 2020-08-21 NOTE — Progress Notes (Signed)
Subjective: [name] [name] presents today at risk foot care with history of diabetic neuropathy and painful callus(es) b/l and painful mycotic toenails b/l that are difficult to trim. Pain interferes with ambulation. Aggravating factors include wearing enclosed shoe gear. Pain is relieved with periodic professional debridement.  Dickie La, MD is patient's PCP. Last visit was: 02/18/2020.  She did not check blood sugar this morning.  Past Medical History:  Diagnosis Date   Allergy    Angina    Anxiety    Arthritis    Asthma    Blood transfusion    Blood transfusion without reported diagnosis    Cataract    Chronic cough    Now followed by pulmonary   Chronic lower back pain    Complication of anesthesia    "just wake up coughing; that's all"   Concussion 11/18/11   "fell at dr's office; hit head"   COPD (chronic obstructive pulmonary disease) ([place])    Delayed gastric emptying    Depression    Diabetes mellitus type II    "take insulin & pills"   Dyslipidemia    Exertional dyspnea    GERD (gastroesophageal reflux disease)    Glaucoma    Gout    Headache(784.0) 11/18/11   "I've had mild headaches the last couple days"   Headache(784.0) 01/08/12   "pretty constant since 11/18/11's concussion"   Heart murmur    History of colonoscopy    HTN (hypertension)    Myocardial infarction ([place])    Osteoporosis    Peripheral neuropathy    Pneumonia    "i've had it once" (11/18/11)   Sleep apnea    on CPAP 12   Syncope 06/16/2012   Vocal cord paralysis, unilateral partial    "right"     Current Outpatient Medications on File Prior to Visit  Medication Sig Dispense Refill   ACCU-CHEK FASTCLIX LANCETS MISC TEST THREE TIMES DAILY 306 each 12   acyclovir (ZOVIRAX) 200 MG capsule TAKE 1 CAPSULE BY MOUTH TWO TIMES DAILY 180 capsule 3   albuterol (VENTOLIN HFA) 108 (90 Base) MCG/ACT inhaler USE 2 PUFFS BY MOUTH INTO  THE LUNGS EVERY 4 HOURS  AS  NEEDED FOR WHEEZING OR  SHORTNESS OF BREATH 51 g 1   Alcohol Swabs (B-D SINGLE USE SWABS REGULAR) PADS USE THREE TIMES DAILY 300 each 3   allopurinol (ZYLOPRIM) 300 MG tablet TAKE 1 TABLET BY MOUTH  DAILY 90 tablet 3   ammonium lactate (LAC-HYDRIN) 12 % lotion APPLY 1 APPLICATION TOPICALLY TWICE DAILY AS NEEDED FOR DRY SKIN (SUBSTITUTED FOR LAC HYDRIN) 400 g 12   aspirin EC 81 MG tablet Take 81 mg by mouth daily.     atorvastatin (LIPITOR) 40 MG tablet TAKE 1 TABLET BY MOUTH  DAILY 90 tablet 3   azelastine (ASTELIN) 0.1 % nasal spray USE 2 SPRAYS IN EACH NOSTRIL EVERY DAY AS NEEDED AS DIRECTED 60 mL 3   B-D ULTRAFINE III SHORT PEN 31G X 8 MM MISC USE TO INJECT THREE TIMES DAILY 270 each 3   bisacodyl (DULCOLAX) 10 MG suppository Place 1 suppository (10 mg total) rectally as needed for moderate constipation. 12 suppository 0   budesonide-formoterol (SYMBICORT) 160-4.5 MCG/ACT inhaler USE 2 INHALATIONS BY MOUTH  TWICE DAILY - RINSE,  GARGLE, AND SPIT AFTER USE 30.6 g 3   buPROPion (WELLBUTRIN) 100 MG tablet TAKE 1 TABLET BY MOUTH  TWICE DAILY 180 tablet 0   COMBIGAN 0.2-0.5 % ophthalmic solution INSTILL 1 DROP  INTO THE  LEFT EYE EVERY 12 HOURS 5 mL 12   DULoxetine (CYMBALTA) 60 MG capsule TAKE 1 CAPSULE BY MOUTH  DAILY 90 capsule 0   EPINEPHrine 0.3 mg/0.3 mL IJ SOAJ injection Inject 0.3 mLs (0.3 mg total) into the muscle once as needed (for allergic reaction). 1 Device 3   esomeprazole (NEXIUM) 40 MG capsule TAKE 1 CAPSULE BY MOUTH  DAILY 90 capsule 3   ezetimibe (ZETIA) 10 MG tablet TAKE 1 TABLET BY MOUTH  DAILY 90 tablet 3   famotidine (PEPCID) 40 MG tablet TAKE 1 TABLET BY MOUTH  DAILY 90 tablet 0   fluticasone (FLONASE) 50 MCG/ACT nasal spray Place 1 spray into both nostrils in the morning and at bedtime. 48 g 3   furosemide (LASIX) 40 MG tablet Take one tablet by mouth daily 90 tablet 3   glucose blood (PRODIGY NO CODING BLOOD GLUC) test strip USE AS INSTRUCTED TO CHECK  BLOOD SUGAR THREE TIMES A DAY 300 each 12   HUMALOG KWIKPEN 100 UNIT/ML KwikPen SUBCUTANEOUSLY INJECT 35  UNITS 2 TIMES DAILY AFTER  MEALS AS DIRECTED 75 mL 3   insulin glargine (LANTUS SOLOSTAR) 100 UNIT/ML Solostar Pen INJECT SUBCUTANEOUSLY 35  UNITS TWICE DAILY 75 mL 3   Insulin Syringe-Needle U-100 (BD INSULIN SYRINGE ULTRAFINE) 31G X 15/64" 0.5 ML MISC Use to inject three times a day 300 each 2   latanoprost (XALATAN) 0.005 % ophthalmic solution Place 1 drop into both eyes at bedtime. 2.5 mL 3   levocetirizine (XYZAL) 5 MG tablet Take 1 tablet (5 mg total) by mouth every evening. As needed for the hives/rash. 90 tablet 2   LINZESS 290 MCG CAPS capsule Take 290 mcg by mouth daily as needed.      losartan (COZAAR) 50 MG tablet Take 2 tablets (100 mg total) by mouth daily. 90 tablet 3   metoCLOPramide (REGLAN) 5 MG tablet TAKE 1 TABLET BY MOUTH 4  TIMES DAILY 360 tablet 3   metoprolol succinate (TOPROL-XL) 50 MG 24 hr tablet TAKE 3 TABLETS BY MOUTH  EVERY DAY WITH OR  IMMEDIATELY FOLLOWING A  MEAL 270 tablet 3   montelukast (SINGULAIR) 10 MG tablet TAKE 1 TABLET BY MOUTH  DAILY 90 tablet 3   Multiple Vitamins-Minerals (MULTIVITAMIN PO) Take 1 tablet by mouth daily.     permethrin (ELIMITE) 5 % cream Apply from neck down to entire body except genital area and leave on fo0r 8 hours then rinse off and repeat in one week 60 g 0   PROAIR HFA 108 (90 Base) MCG/ACT inhaler USE 2 INHALATIONS BY MOUTH  EVERY 4 HOURS AS NEEDED FOR WHEEZING OR SHORTNESS OF  BREATH 51 g 3   RESTASIS MULTIDOSE 0.05 % ophthalmic emulsion Place 1 drop into both eyes daily as needed.  3   spironolactone (ALDACTONE) 25 MG tablet TAKE 1 TABLET BY MOUTH  DAILY BEFORE SUPPER 90 tablet 3   traMADol (ULTRAM) 50 MG tablet TAKE 1-2 TABLETS BY MOUTH EVERY 8 HOURS AS NEEDED FOR PAIN. MAX 6 TABLETS PER 24 HOURS 093 tablet 5   TRULICITY 1.5 GH/8.2XH SOPN INJECT SUBCUTANEOUSLY 1.5  MG EVERY WEEK 6 mL 3   No current  facility-administered medications on file prior to visit.     Allergies  Allergen Reactions   Bee Venom Hives and Swelling   Propoxyphene Hcl Itching   Amlodipine Besylate Swelling   Hydrocodone Other (See Comments)    With Vicodin - makes patient "jittery", and "hangover effect -  sleepy next day"   Lisinopril Cough    Changed to ARB   Metformin And Related Diarrhea    GI distress   Metronidazole Other (See Comments)    REACTION: "just didn't work; my body never did heal from it"   Valsartan Other (See Comments)    REACTION:  "sleep more the next day after I took it; it made me real tired"   Xyzal [Levocetirizine Dihydrochloride] Rash    Objective: [name] [name] is a pleasant 69 y.o.  African American female  in NAD. AAO x 3.  There were no vitals filed for this visit.  Vascular Examination: Neurovascular status unchanged b/l lower extremities. Capillary refill time to digits immediate b/l. Palpable DP pulses b/l. Palpable PT pulses b/l. Pedal hair absent b/l. Skin temperature gradient within normal limits b/l.  Dermatological Examination: Pedal skin with normal turgor, texture and tone bilaterally. No open wounds bilaterally. No interdigital macerations bilaterally. Toenails 1-5 b/l elongated, discolored, dystrophic, thickened, crumbly with subungual debris and tenderness to dorsal palpation. Hyperkeratotic lesion(s) submet head 1 right foot, submet head 4 left foot and plantar aspect b/l heel pads.  No erythema, no edema, no drainage, no flocculence.  Musculoskeletal: Normal muscle strength 5/5 to all lower extremity muscle groups bilaterally. No pain crepitus or joint limitation noted with ROM b/l. Hammertoes noted to the 2-5 bilaterally. Pes planus deformity noted b/l.  Utilizes rollator for ambulation assistance.  Neurological Examination: Pt has subjective symptoms of neuropathy. Protective sensation intact 5/5 intact bilaterally with 10g monofilament b/l.  Vibratory sensation intact b/l. Proprioception intact bilaterally.  Assessment: 1. Pain due to onychomycosis of toenails of both feet   2. Callus   3. Diabetic peripheral neuropathy associated with type 2 diabetes mellitus ([place])     Plan: -Examined patient. -Continue diabetic foot care principles. -Toenails 1-5 b/l were debrided in length and girth with sterile nail nippers and dremel without iatrogenic bleeding.  -Callus(es) submet head 1 right foot and submet head 4 left foot pared utilizing sterile scalpel blade without complication or incident. Total number debrided =2. -Patient to report any pedal injuries to medical professional immediately. -Patient to continue soft, supportive shoe gear daily. -Patient/POA to call should there be question/concern in the interim.  Return in about 3 months (around 11/16/2020) for diabetic foot care.  Marzetta Board, DPM

## 2020-09-06 ENCOUNTER — Other Ambulatory Visit: Payer: Self-pay

## 2020-09-06 ENCOUNTER — Encounter: Payer: Self-pay | Admitting: Family Medicine

## 2020-09-06 ENCOUNTER — Ambulatory Visit (INDEPENDENT_AMBULATORY_CARE_PROVIDER_SITE_OTHER): Payer: Medicare Other | Admitting: Family Medicine

## 2020-09-06 VITALS — BP 150/70 | HR 74 | Ht 61.0 in | Wt 188.0 lb

## 2020-09-06 DIAGNOSIS — E1149 Type 2 diabetes mellitus with other diabetic neurological complication: Secondary | ICD-10-CM

## 2020-09-06 DIAGNOSIS — Z23 Encounter for immunization: Secondary | ICD-10-CM

## 2020-09-06 LAB — POCT GLYCOSYLATED HEMOGLOBIN (HGB A1C): Hemoglobin A1C: 7 % — AB (ref 4.0–5.6)

## 2020-09-06 MED ORDER — TRIAMCINOLONE ACETONIDE 0.1 % EX CREA: 1.0000 "application " | TOPICAL_CREAM | Freq: Two times a day (BID) | CUTANEOUS | 5 refills | Status: DC

## 2020-09-07 ENCOUNTER — Other Ambulatory Visit (HOSPITAL_COMMUNITY): Payer: Self-pay | Admitting: Psychiatry

## 2020-09-07 DIAGNOSIS — F411 Generalized anxiety disorder: Secondary | ICD-10-CM

## 2020-09-07 DIAGNOSIS — F330 Major depressive disorder, recurrent, mild: Secondary | ICD-10-CM

## 2020-09-07 NOTE — Progress Notes (Signed)
    CHIEF COMPLAINT / HPI:  1. F/u DM: no problems, no weight change. Taking meds without problem. No episodes low blood sugar. 2. Wants COVID booster today   PERTINENT  PMH / PSH: I have reviewed the patient's medications, allergies, past medical and surgical history, smoking status and updated in the EMR as appropriate.   OBJECTIVE:  BP (!) 150/70   Pulse 74   Ht 5\' 1"  (1.549 m)   Wt 188 lb (85.3 kg)   SpO2 99%   BMI 35.52 kg/m  Vital signs reviewed. GENERAL: Well-developed, well-nourished, no acute distress. CARDIOVASCULAR: Regular rate and rhythm no murmur gallop or rub LUNGS: Clear to auscultation bilaterally, no rales or wheeze. ABDOMEN: Soft positive bowel sounds NEURO: legally lind. Decreased sensation stocking glove (unchanged) MSK: Movement of extremity x 4.    ASSESSMENT / PLAN:   Diabetes mellitus type 2 with neurological manifestations (HCC) A1C slightly up and we discussed. F/u 3 m. No med changes.   MD

## 2020-09-07 NOTE — Assessment & Plan Note (Signed)
A1C slightly up and we discussed. F/u 3 m. No med changes.

## 2020-09-23 ENCOUNTER — Other Ambulatory Visit (HOSPITAL_COMMUNITY): Payer: Self-pay | Admitting: Psychiatry

## 2020-09-23 ENCOUNTER — Other Ambulatory Visit: Payer: Self-pay | Admitting: Allergy

## 2020-09-23 DIAGNOSIS — F411 Generalized anxiety disorder: Secondary | ICD-10-CM

## 2020-09-23 DIAGNOSIS — F330 Major depressive disorder, recurrent, mild: Secondary | ICD-10-CM

## 2020-09-29 ENCOUNTER — Telehealth: Payer: Self-pay | Admitting: Internal Medicine

## 2020-09-29 DIAGNOSIS — G4733 Obstructive sleep apnea (adult) (pediatric): Secondary | ICD-10-CM

## 2020-09-29 NOTE — Telephone Encounter (Signed)
Order- DME Huey Romans- please change CPAP auto range to 4-12. Please teach patient how to adjust her humidifier for her complaint of dryness.

## 2020-09-29 NOTE — Telephone Encounter (Signed)
Informed patient order placed with [facility] to change pressure settings.

## 2020-09-29 NOTE — Telephone Encounter (Signed)
Order placed for [facility] to change setting and teach patient.   Called patient unable to reach when she calls back please tell her order was placed to [place] for CPAP machine.

## 2020-09-29 NOTE — Telephone Encounter (Signed)
Spoke with pt who states CPAP machine is drying out mouth and nasal passages during use. Pt is wondering if CPAP pressure change could reduce dryness. Dr. Annamaria Boots could you please advise? Thank you.

## 2020-10-02 DIAGNOSIS — G4733 Obstructive sleep apnea (adult) (pediatric): Secondary | ICD-10-CM | POA: Diagnosis not present

## 2020-10-26 ENCOUNTER — Ambulatory Visit: Payer: Medicare Other

## 2020-10-29 ENCOUNTER — Other Ambulatory Visit: Payer: Self-pay | Admitting: Family Medicine

## 2020-10-29 DIAGNOSIS — E1149 Type 2 diabetes mellitus with other diabetic neurological complication: Secondary | ICD-10-CM

## 2020-11-02 ENCOUNTER — Other Ambulatory Visit: Payer: Self-pay

## 2020-11-02 ENCOUNTER — Telehealth (INDEPENDENT_AMBULATORY_CARE_PROVIDER_SITE_OTHER): Payer: Medicare Other | Admitting: Psychiatry

## 2020-11-02 ENCOUNTER — Other Ambulatory Visit: Payer: Self-pay | Admitting: Family Medicine

## 2020-11-02 DIAGNOSIS — F411 Generalized anxiety disorder: Secondary | ICD-10-CM | POA: Diagnosis not present

## 2020-11-02 DIAGNOSIS — F330 Major depressive disorder, recurrent, mild: Secondary | ICD-10-CM

## 2020-11-02 MED ORDER — DULOXETINE HCL 60 MG PO CPEP: 60.0000 mg | ORAL_CAPSULE | Freq: Every day | ORAL | 0 refills | Status: DC

## 2020-11-02 MED ORDER — BUPROPION HCL 100 MG PO TABS: 100.0000 mg | ORAL_TABLET | Freq: Two times a day (BID) | ORAL | 0 refills | Status: DC

## 2020-11-02 NOTE — Progress Notes (Signed)
Virtual Visit via Telephone Note  I connected with [name] [name] on 11/02/20 at  3:30 PM EST by telephone and verified that I am speaking with the correct person using two identifiers.  Location: Patient: home Provider: office   I discussed the limitations, risks, security and privacy concerns of performing an evaluation and management service by telephone and the availability of in person appointments. I also discussed with the patient that there may be a patient responsible charge related to this service. The patient expressed understanding and agreed to proceed.   History of Present Illness: Bianney shares that she is doing really well. Her depression and anxiety stable, "so far so good". Her sleep is good with her CPAP. Her energy is generally low due to meds. She denies SI/HI.    Observations/Objective:  General Appearance: unable to assess  Eye Contact:  unable to assess  Speech:  Clear and Coherent and Normal Rate  Volume:  Normal  Mood:  Euthymic  Affect:  Full Range  Thought Process:  Goal Directed, Linear and Descriptions of Associations: Intact  Orientation:  Full (Time, Place, and Person)  Thought Content:  Logical  Suicidal Thoughts:  No  Homicidal Thoughts:  No  Memory:  Immediate;   Good  Judgement:  Good  Insight:  Good  Psychomotor Activity: unable to assess  Concentration:  Concentration: Good  Recall:  Good  Fund of Knowledge:  Good  Language:  Good  Akathisia:  unable to assess  Handed:  Right  AIMS (if indicated):     Assets:  Communication Skills Desire for Improvement Financial Resources/Insurance Housing Leisure Time Resilience Social Support Talents/Skills Transportation Vocational/Educational  ADL's:  unable to assess  Cognition:  WNL  Sleep:         Assessment and Plan: 1. GAD (generalized anxiety disorder) - DULoxetine (CYMBALTA) 60 MG capsule; Take 1 capsule (60 mg total) by mouth daily.  Dispense: 90 capsule; Refill:  0 - buPROPion (WELLBUTRIN) 100 MG tablet; Take 1 tablet (100 mg total) by mouth 2 (two) times daily.  Dispense: 180 tablet; Refill: 0  2. Mild episode of recurrent major depressive disorder (HCC) - DULoxetine (CYMBALTA) 60 MG capsule; Take 1 capsule (60 mg total) by mouth daily.  Dispense: 90 capsule; Refill: 0 - buPROPion (WELLBUTRIN) 100 MG tablet; Take 1 tablet (100 mg total) by mouth 2 (two) times daily.  Dispense: 180 tablet; Refill: 0  Depression screen [facility] 2/9 11/02/2020 01/20/2020 11/17/2019 07/14/2019 04/28/2019  Decreased Interest 0 0 0 0 0  Down, Depressed, Hopeless 0 1 0 0 0  PHQ - 2 Score 0 1 0 0 0  Some recent data might be hidden   Flowsheet Row Video Visit from 11/02/2020 in [facility]  C-SSRS RISK CATEGORY No Risk       Follow Up Instructions: In 5-6 months or sooner if needed   I discussed the assessment and treatment plan with the patient. The patient was provided an opportunity to ask questions and all were answered. The patient agreed with the plan and demonstrated an understanding of the instructions.   The patient was advised to call back or seek an in-person evaluation if the symptoms worsen or if the condition fails to improve as anticipated.  I provided 11 minutes of non-face-to-face time during this encounter.   Charlcie Cradle, MD

## 2020-11-08 DIAGNOSIS — I10 Essential (primary) hypertension: Secondary | ICD-10-CM | POA: Diagnosis not present

## 2020-11-08 DIAGNOSIS — E1149 Type 2 diabetes mellitus with other diabetic neurological complication: Secondary | ICD-10-CM | POA: Diagnosis not present

## 2020-11-15 ENCOUNTER — Other Ambulatory Visit: Payer: Self-pay | Admitting: Family Medicine

## 2020-11-15 NOTE — Progress Notes (Signed)
Patient ID: [name] [name], female    DOB: May 25, 1951, 70 y.o.   MRN: [id number]   Brief patient profile:   48 yobf never regular smoker quit in the 1977   followed for asthma, sleep apnea, complicated by hx depression, vocal cord paralysis, GERD. NPSG 07/19/10  AHI 7.3/ hr, desaturation to 83%, body weight 186 lbs PFT 07/19/2010-moderate obstructive airways disease, insignificant response to bronchodilator, FEV1/FVC 0.65, TLC 63%, DLCO 71% office spirometry ([place])  09/09/2018-reviewed. FVC 1.75/86%, FEV1 1.33/84%, ratio 0.76, FEF 25-75% 1.22/78% --------------------------------------------------------------------------------------   11/18/19- Virtual Visit via Telephone Note   History of Present Illness: 70 year old female former occasional smoker followed for asthma/COPD, OSA, complicated by history depression, vocal cord paralysis, GERD, obesity, DM 2, glaucoma CPAP auto 5-15/[facility]  Singulair, Astelin, albuterol hfa, Symbicort 160 Reports doing well with CPAP. Had Covid infection early Feb, Rx'd with infusion as outpatient. Feels back to normal and pending Covid vax when eligible.  Breathing ok, using daily Symbicort, rare ventlin. Dome DOE steps and long walks after being indoors most of past year.Last CXR 2019 was clear.   Observations/Objective: Download compliance 93%, AHI 1.4/ hr  Assessment and Plan: OSA- doing well- continue auto 5-15 Asthma/ COPD overlap- well-controlled, to continue Symbicort  Follow Up Instructions: 1 year   11/16/20- 70 year old female former occasional smoker followed for asthma/COPD, OSA, complicated by history depression, vocal cord paralysis, GERD, obesity, DM 2, glaucoma, Covid infection 2021,  CPAP auto 5-15/[facility]  Singulair, Astelin, albuterol hfa, Symbicort 160 Download-none Body weight today-179 lbs Covid vax-3 Phizer Flu vax-had Reports doing very well, sleeping well with CPAP- used "almost every night". Breathing comfortably  with no exacerbations. Daily Symbicort, occasional rescue hfa with no night-time asthma.  ROS-see HPI   + = positive Constitutional:    weight loss, night sweats, fevers, chills, fatigue, lassitude. HEENT:    headaches, difficulty swallowing, tooth/dental problems, sore throat,       sneezing, itching, ear ache, nasal congestion, post nasal drip, snoring CV:    chest pain, orthopnea, PND, swelling in lower extremities, anasarca,                                                           dizziness, palpitations Resp:   shortness of breath with exertion or at rest.                productive cough,   non-productive cough, coughing up of blood.              change in color of mucus.  wheezing.   Skin:    rash or lesions. GI:  No-   heartburn, indigestion, abdominal pain, nausea, vomiting, diarrhea,                 change in bowel habits, loss of appetite GU: dysuria, change in color of urine, no urgency or frequency.   flank pain. MS:   joint pain, stiffness, decreased range of motion, back pain. Neuro-     nothing unusual Psych:  change in mood or affect.  depression or anxiety.   memory loss.  OBJ- Physical Exam General- Alert, Oriented, Affect-appropriate, Distress- none acute, + obese Skin- rash-none, lesions- none, excoriation- none Lymphadenopathy- none Head- atraumatic            Eyes- +  impaired/ thick glasses, PERRLA, conjunctivae and secretions clear            Ears- Hearing, canals-normal            Nose- Clear, no-Septal dev, mucus, polyps, erosion, perforation             Throat- Mallampati II , mucosa clear , drainage- none, tonsils- atrophic Neck- flexible , trachea midline, no stridor , thyroid nl, carotid no bruit Chest - symmetrical excursion , unlabored           Heart/CV- RRR , no murmur , no gallop  , no rub, nl s1 s2                           - JVD- none , edema- none, stasis changes- none, varices- none           Lung- clear to P&A, wheeze- none, cough- none ,  dullness-none, rub- none           Chest wall-  Abd-  Br/ Gen/ Rectal- Not done, not indicated Extrem- + rolling walker Neuro- grossly intact to observation

## 2020-11-16 ENCOUNTER — Other Ambulatory Visit: Payer: Self-pay

## 2020-11-16 ENCOUNTER — Encounter: Payer: Self-pay | Admitting: Internal Medicine

## 2020-11-16 ENCOUNTER — Ambulatory Visit (INDEPENDENT_AMBULATORY_CARE_PROVIDER_SITE_OTHER): Payer: Medicare Other | Admitting: Internal Medicine

## 2020-11-16 DIAGNOSIS — G4733 Obstructive sleep apnea (adult) (pediatric): Secondary | ICD-10-CM

## 2020-11-16 DIAGNOSIS — J4540 Moderate persistent asthma, uncomplicated: Secondary | ICD-10-CM

## 2020-11-16 NOTE — Patient Instructions (Signed)
We can continue CPAP auto 5-15  We can continue current meds  Please call if we  Can help

## 2020-11-17 ENCOUNTER — Ambulatory Visit (INDEPENDENT_AMBULATORY_CARE_PROVIDER_SITE_OTHER): Payer: Medicare Other | Admitting: Podiatry

## 2020-11-17 ENCOUNTER — Encounter: Payer: Self-pay | Admitting: Podiatry

## 2020-11-17 ENCOUNTER — Other Ambulatory Visit: Payer: Self-pay | Admitting: Allergy

## 2020-11-17 ENCOUNTER — Other Ambulatory Visit (HOSPITAL_COMMUNITY): Payer: Self-pay | Admitting: Psychiatry

## 2020-11-17 DIAGNOSIS — E1142 Type 2 diabetes mellitus with diabetic polyneuropathy: Secondary | ICD-10-CM

## 2020-11-17 DIAGNOSIS — M79674 Pain in right toe(s): Secondary | ICD-10-CM | POA: Diagnosis not present

## 2020-11-17 DIAGNOSIS — B351 Tinea unguium: Secondary | ICD-10-CM | POA: Diagnosis not present

## 2020-11-17 DIAGNOSIS — L84 Corns and callosities: Secondary | ICD-10-CM

## 2020-11-17 DIAGNOSIS — M79675 Pain in left toe(s): Secondary | ICD-10-CM | POA: Diagnosis not present

## 2020-11-17 NOTE — Progress Notes (Signed)
Subjective: [name] [name] presents today at risk foot care with history of diabetic neuropathy and painful callus(es) b/l and painful mycotic toenails b/l that are difficult to trim. Pain interferes with ambulation. Aggravating factors include wearing enclosed shoe gear. Pain is relieved with periodic professional debridement.   Patient's blood glucose was "130-something" this morning. She relates her calluses are bothering her today.  Dickie La, MD is patient's PCP. Last visit was: 09/06/2020  Allergies  Allergen Reactions  . Bee Venom Hives and Swelling  . Propoxyphene Hcl Itching  . Amlodipine Besylate Swelling  . Hydrocodone Other (See Comments)    With Vicodin - makes patient "jittery", and "hangover effect - sleepy next day"  . Lisinopril Cough    Changed to ARB  . Metformin And Related Diarrhea    GI distress  . Metronidazole Other (See Comments)    REACTION: "just didn't work; my body never did heal from it"  . Valsartan Other (See Comments)    REACTION:  "sleep more the next day after I took it; it made me real tired"  . Propoxyphene     Other reaction(s): Unknown  . Sulfasalazine     Other reaction(s): Unknown  . Xyzal [Levocetirizine Dihydrochloride] Rash    Objective: [name] [name] is a pleasant 70 y.o.  African American female  in NAD. AAO x 3.  There were no vitals filed for this visit.  Vascular Examination: Neurovascular status unchanged b/l lower extremities. Capillary refill time to digits immediate b/l. Palpable DP pulses b/l. Palpable PT pulses b/l. Pedal hair absent b/l. Skin temperature gradient within normal limits b/l.  Dermatological Examination: Pedal skin with normal turgor, texture and tone bilaterally. No open wounds bilaterally. No interdigital macerations bilaterally. Toenails 1-5 b/l elongated, discolored, dystrophic, thickened, crumbly with subungual debris and tenderness to dorsal palpation. Hyperkeratotic lesion(s) submet  head 1 right foot, submet head 4 left foot and plantar aspect b/l heel pads.  No erythema, no edema, no drainage, no flocculence.  Musculoskeletal: Normal muscle strength 5/5 to all lower extremity muscle groups bilaterally. No pain crepitus or joint limitation noted with ROM b/l. Hammertoes noted to the 2-5 bilaterally. Pes planus deformity noted b/l.  Utilizes rollator for ambulation assistance.  Neurological Examination: Pt has subjective symptoms of neuropathy. Protective sensation intact 5/5 intact bilaterally with 10g monofilament b/l. Vibratory sensation intact b/l. Proprioception intact bilaterally.  Assessment: 1. Pain due to onychomycosis of toenails of both feet   2. Callus   3. Diabetic peripheral neuropathy associated with type 2 diabetes mellitus ([place])     Plan: -Examined patient. -Continue diabetic foot care principles. -Toenails 1-5 b/l were debrided in length and girth with sterile nail nippers and dremel without iatrogenic bleeding.  -Callus(es) b/l heels, submet head 1 right foot and submet head 4 left foot pared utilizing sterile scalpel blade without complication or incident. Total number debrided =4. -Patient to report any pedal injuries to medical professional immediately. -Patient/POA to call should there be question/concern in the interim.  Return in about 3 months (around 02/17/2021).  Marzetta Board, DPM

## 2020-11-23 ENCOUNTER — Other Ambulatory Visit: Payer: Self-pay | Admitting: Family Medicine

## 2020-11-24 DIAGNOSIS — H401133 Primary open-angle glaucoma, bilateral, severe stage: Secondary | ICD-10-CM | POA: Diagnosis not present

## 2020-12-13 ENCOUNTER — Other Ambulatory Visit: Payer: Self-pay

## 2020-12-13 ENCOUNTER — Ambulatory Visit
Admission: RE | Admit: 2020-12-13 | Discharge: 2020-12-13 | Disposition: A | Discharge status: Home / Self Care | Payer: Medicare Other | Source: Ambulatory Visit | Attending: Family Medicine | Admitting: Family Medicine

## 2020-12-13 ENCOUNTER — Ambulatory Visit: Payer: Medicare Other | Admitting: Family Medicine

## 2020-12-13 DIAGNOSIS — Z1231 Encounter for screening mammogram for malignant neoplasm of breast: Secondary | ICD-10-CM

## 2020-12-14 ENCOUNTER — Other Ambulatory Visit: Payer: Self-pay | Admitting: Family Medicine

## 2020-12-14 DIAGNOSIS — R928 Other abnormal and inconclusive findings on diagnostic imaging of breast: Secondary | ICD-10-CM

## 2020-12-29 ENCOUNTER — Other Ambulatory Visit: Payer: Self-pay | Admitting: Allergy

## 2021-01-08 ENCOUNTER — Ambulatory Visit
Admission: RE | Admit: 2021-01-08 | Discharge: 2021-01-08 | Disposition: A | Discharge status: Home / Self Care | Payer: Medicare Other | Source: Ambulatory Visit | Attending: Family Medicine | Admitting: Family Medicine

## 2021-01-08 ENCOUNTER — Other Ambulatory Visit: Payer: Self-pay

## 2021-01-08 ENCOUNTER — Other Ambulatory Visit: Payer: Self-pay | Admitting: Family Medicine

## 2021-01-08 DIAGNOSIS — R928 Other abnormal and inconclusive findings on diagnostic imaging of breast: Secondary | ICD-10-CM

## 2021-01-08 DIAGNOSIS — N6489 Other specified disorders of breast: Secondary | ICD-10-CM | POA: Diagnosis not present

## 2021-01-10 ENCOUNTER — Other Ambulatory Visit: Payer: Self-pay

## 2021-01-10 ENCOUNTER — Encounter: Payer: Self-pay | Admitting: Family Medicine

## 2021-01-10 ENCOUNTER — Ambulatory Visit (INDEPENDENT_AMBULATORY_CARE_PROVIDER_SITE_OTHER): Payer: Medicare Other | Admitting: Family Medicine

## 2021-01-10 ENCOUNTER — Ambulatory Visit (INDEPENDENT_AMBULATORY_CARE_PROVIDER_SITE_OTHER): Payer: Medicare Other

## 2021-01-10 VITALS — HR 79 | Ht 61.0 in | Wt 179.2 lb

## 2021-01-10 DIAGNOSIS — F339 Major depressive disorder, recurrent, unspecified: Secondary | ICD-10-CM

## 2021-01-10 DIAGNOSIS — Z23 Encounter for immunization: Secondary | ICD-10-CM | POA: Diagnosis not present

## 2021-01-10 DIAGNOSIS — L659 Nonscarring hair loss, unspecified: Secondary | ICD-10-CM | POA: Diagnosis not present

## 2021-01-10 DIAGNOSIS — L299 Pruritus, unspecified: Secondary | ICD-10-CM | POA: Diagnosis not present

## 2021-01-10 DIAGNOSIS — I10 Essential (primary) hypertension: Secondary | ICD-10-CM

## 2021-01-10 DIAGNOSIS — E1149 Type 2 diabetes mellitus with other diabetic neurological complication: Secondary | ICD-10-CM

## 2021-01-10 DIAGNOSIS — R21 Rash and other nonspecific skin eruption: Secondary | ICD-10-CM | POA: Diagnosis not present

## 2021-01-10 LAB — POCT GLYCOSYLATED HEMOGLOBIN (HGB A1C): HbA1c, POC (controlled diabetic range): 6.6 % (ref 0.0–7.0)

## 2021-01-10 NOTE — Assessment & Plan Note (Signed)
She does have a few irregular red macules on her upper extremities today.  The very small about 3 mm in largest diameter.  They are not circular.  She also has 1 slightly raised bump that is pretty tender on her left hand.  No fluctuance.  We discussed before sending her to dermatology and I think at this point we will.

## 2021-01-10 NOTE — Assessment & Plan Note (Signed)
Continues to be followed by psychiatry and feels her mood is pretty stable right now.  Overall she feels like she is doing well.

## 2021-01-10 NOTE — Assessment & Plan Note (Signed)
She does have a large amount of hair loss.  I want to send her to the dermatology anyway for the nonspecific rash she has had now for months.  We will asked them also to evaluate her hair loss.  I reviewed her medicines and do not see anything that would necessarily cause that.

## 2021-01-10 NOTE — Assessment & Plan Note (Signed)
Her A1c looks great today.  She is Advertising account executive.  No medication changes will continue as she is.  She has her diabetic eye exam scheduled.

## 2021-01-10 NOTE — Patient Instructions (Addendum)
Your A1C is great at 6.6.  I am getting some other labs and will send you a note about that.  Great to see you! Let me see you back in 3-6 months.  I put in your referral to dermatology.

## 2021-01-10 NOTE — Assessment & Plan Note (Signed)
Excellent control.  Do not need lab work today and we will check that in the next visit in about 3 to 6 months.  No medication changes.

## 2021-01-10 NOTE — Progress Notes (Signed)
CHIEF COMPLAINT / HPI: #1.  Diabetes mellitus: Reports taking all her medicines.  No episodes of low blood sugar.  Not checking her sugars regularly but feels like she is done a better job with her diet.  No problems with medicines.  For her diabetic eye exam, it is scheduled in June. 2.  Hypertension: Taking medicines regularly without problem.  No lower extremity edema.  No chest pain.  No change in exertion level.  No dyspnea on exertion. 3.  Skin: Continues to have itchy rash primarily on her upper extremities but a little bit on the left lower thigh area.  Starts out as small red bumps that quickly swell and then dissipate.  They are only swollen for a few hours but are tender during that time.  The itching is constant.  She continues on the Xyzal which has helped some. 4.  Losing most of her hair on her scalp.  She is not sure if it is alopecia or something else.  Would like to see dermatologist.   PERTINENT  PMH / [place]: I have reviewed the patient's medications, allergies, past medical and surgical history, smoking status and updated in the EMR as appropriate.   OBJECTIVE:  Pulse 79   Ht 5\' 1"  (1.549 m)   Wt 179 lb 3.2 oz (81.3 kg)   SpO2 97%   BMI 33.86 kg/m  Vital signs reviewed. GENERAL: Well-developed, well-nourished, no acute distress. CARDIOVASCULAR: Regular rate and rhythm no murmur gallop or rub LUNGS: Clear to auscultation bilaterally, no rales or wheeze. ABDOMEN: Soft positive bowel sounds NEURO: Bilateral blindness.  Resting horizontal nystagmus, intermittent.  Decreased hearing bilaterally. MSK: Movement of extremity x 4.  Uses walker.  Able to get up out of a chair with use of her walker and ambulate down the hall.  Posture somewhat stooped. SKIN: Slightly dry skin on the upper extremities.  She has several small irregular erythematous macules, greatest diameter 3 to 4 mm.  They are not circular.  She has 1 small papule on her left hand that is tender to palpation,  about 7 to 8 mm in diameter.  Erythematous.  Nonfluctuant. Scalp: Significant hair loss, no specific pattern. FEET: Decreased sensation to soft touch bilaterally in a stocking pattern with decreased sensation extending up to mid calf.  Some nail thickening.  Mild callus formation.  No ulcers.   ASSESSMENT / PLAN:   HYPERTENSION, BENIGN SYSTEMIC Excellent control.  Do not need lab work today and we will check that in the next visit in about 3 to 6 months.  No medication changes.  Diabetes mellitus type 2 with neurological manifestations ([place]) Her A1c looks great today.  She is Advertising account executive.  No medication changes will continue as she is.  She has her diabetic eye exam scheduled.  Major depressive disorder, recurrent episode Continues to be followed by psychiatry and feels her mood is pretty stable right now.  Overall she feels like she is doing well.  Rash and nonspecific skin eruption She does have a few irregular red macules on her upper extremities today.  The very small about 3 mm in largest diameter.  They are not circular.  She also has 1 slightly raised bump that is pretty tender on her left hand.  No fluctuance.  We discussed before sending her to dermatology and I think at this point we will.  Hair loss She does have a large amount of hair loss.  I want to send her to the dermatology anyway  for the nonspecific rash she has had now for months.  We will asked them also to evaluate her hair loss.  I reviewed her medicines and do not see anything that would necessarily cause that.   [name] MD

## 2021-01-11 LAB — COMPREHENSIVE METABOLIC PANEL
ALT: 14 IU/L (ref 0–32)
AST: 16 IU/L (ref 0–40)
Albumin/Globulin Ratio: 1.5 (ref 1.2–2.2)
Albumin: 4 g/dL (ref 3.8–4.8)
Alkaline Phosphatase: 116 IU/L (ref 44–121)
BUN/Creatinine Ratio: 15 (ref 12–28)
BUN: 12 mg/dL (ref 8–27)
Bilirubin Total: 0.2 mg/dL (ref 0.0–1.2)
CO2: 26 mmol/L (ref 20–29)
Calcium: 9.3 mg/dL (ref 8.7–10.3)
Chloride: 103 mmol/L (ref 96–106)
Creatinine, Ser: 0.79 mg/dL (ref 0.57–1.00)
Globulin, Total: 2.7 g/dL (ref 1.5–4.5)
Glucose: 76 mg/dL (ref 65–99)
Potassium: 4 mmol/L (ref 3.5–5.2)
Sodium: 144 mmol/L (ref 134–144)
Total Protein: 6.7 g/dL (ref 6.0–8.5)
eGFR: 81 mL/min/{1.73_m2} (ref >59)

## 2021-01-11 LAB — LDL CHOLESTEROL, DIRECT: LDL Direct: 68 mg/dL (ref 0–99)

## 2021-01-12 ENCOUNTER — Encounter: Payer: Self-pay | Admitting: Family Medicine

## 2021-01-17 ENCOUNTER — Telehealth: Payer: Self-pay

## 2021-01-17 NOTE — Telephone Encounter (Signed)
Request For [place] form dropped off for at front desk for completion.  Verified that patient section of form has been completed.  Last DOS/WCC with PCP was 01/10/21.  Placed form in team folder to be completed by clinical staff.  [employer]

## 2021-01-18 ENCOUNTER — Other Ambulatory Visit: Payer: Self-pay | Admitting: Family Medicine

## 2021-01-18 ENCOUNTER — Other Ambulatory Visit: Payer: Self-pay | Admitting: Allergy

## 2021-01-22 ENCOUNTER — Ambulatory Visit (INDEPENDENT_AMBULATORY_CARE_PROVIDER_SITE_OTHER): Payer: Medicare Other | Admitting: Allergy

## 2021-01-22 ENCOUNTER — Other Ambulatory Visit: Payer: Self-pay

## 2021-01-22 ENCOUNTER — Encounter: Payer: Self-pay | Admitting: Allergy

## 2021-01-22 VITALS — BP 128/86 | HR 70 | Temp 97.8°F | Resp 16 | Ht 61.0 in | Wt 182.6 lb

## 2021-01-22 DIAGNOSIS — J302 Other seasonal allergic rhinitis: Secondary | ICD-10-CM

## 2021-01-22 DIAGNOSIS — J4540 Moderate persistent asthma, uncomplicated: Secondary | ICD-10-CM | POA: Diagnosis not present

## 2021-01-22 DIAGNOSIS — J3089 Other allergic rhinitis: Secondary | ICD-10-CM

## 2021-01-22 DIAGNOSIS — Z91030 Bee allergy status: Secondary | ICD-10-CM

## 2021-01-22 DIAGNOSIS — K219 Gastro-esophageal reflux disease without esophagitis: Secondary | ICD-10-CM | POA: Diagnosis not present

## 2021-01-22 MED ORDER — EPINEPHRINE 0.3 MG/0.3ML IJ SOAJ: 0.3000 mg | Freq: Once | INTRAMUSCULAR | 1 refills | Status: DC | PRN

## 2021-01-22 MED ORDER — FLUTICASONE PROPIONATE 50 MCG/ACT NA SUSP: 1.0000 | Freq: Two times a day (BID) | NASAL | 2 refills | Status: DC

## 2021-01-22 MED ORDER — PROAIR HFA 108 (90 BASE) MCG/ACT IN AERS: 2.0000 | INHALATION_SPRAY | RESPIRATORY_TRACT | 0 refills | Status: DC | PRN

## 2021-01-22 MED ORDER — MONTELUKAST SODIUM 10 MG PO TABS: 1.0000 | ORAL_TABLET | Freq: Every day | ORAL | 2 refills | Status: DC

## 2021-01-22 NOTE — Assessment & Plan Note (Signed)
Stable with below regimen. No recent prednisone use.  Today's spirometry was unremarkable.  ACT score 20.  Daily controller medication(s):continue Symbicort 158mcg 2 puffs twice a day with spacer and rinse mouth afterwards. ? Continue Singulair 10mg  daily   Spacer given and demonstrated proper use with inhaler. Patient understood technique and all questions/concerned were addressed.   Prior to physical activity:May use albuterol rescue inhaler 2 puffs 5 to 15 minutes prior to strenuous physical activities.  Rescue medications:May use albuterol rescue inhaler 2 puffs or nebulizer every 4 to 6 hours as needed for shortness of breath, chest tightness, coughing, and wheezing. Monitor frequency of use.

## 2021-01-22 NOTE — Assessment & Plan Note (Signed)
No stings since last visit.  °· For mild symptoms you can take over the counter antihistamines such as Benadryl and monitor symptoms closely. If symptoms worsen or if you have severe symptoms including breathing issues, throat closure, significant swelling, whole body hives, severe diarrhea and vomiting, lightheadedness then inject epinephrine and seek immediate medical care afterwards. °· Consider testing in future. °

## 2021-01-22 NOTE — Progress Notes (Signed)
Follow Up Note  RE: [name] [name] MRN: [id number] DOB: 02-20-1951 Date of Office Visit: 01/22/2021  Referring provider: Dickie La, MD Primary care provider: Dickie La, MD  Chief Complaint: Follow-up  History of Present Illness: I had the pleasure of seeing [name] [name] for a follow up visit at the [facility] on 01/22/2021. She is a 70 y.o. female, who is being followed for asthma, allergic rhinitis, LPRD and bee sting allergy. Her previous allergy office visit was on 12/15/2019 with Dr. Maudie Mercury. Today is a regular follow up visit.  Moderate persistent asthma ACT score 20. Currently on Symbicort 183mcg 2 puffs twice a day and Singulair 10mg  daily.   Denies any SOB, coughing, wheezing, chest tightness, nocturnal awakenings, ER/urgent care visits or prednisone use since the last visit.  Seasonal and perennial allergic rhinitis Currently on Flonase 1-2 sprays per nostril twice a day. No nosebleeds. Not using azelastine nasal spray as it burns her nose. Takes Xyzal daily.  LPRD (laryngopharyngeal reflux disease) Currently on both Nexium40mg  daily and famotidine 40mg  daily with good benefit.  Bee sting allergy No stings since last visit.   Assessment and Plan: [name] [name] is a 70 y.o. female with: Moderate persistent asthma without complication Stable with below regimen. No recent prednisone use.  Today's spirometry was unremarkable.  ACT score 20.  Daily controller medication(s):continue Symbicort 158mcg 2 puffs twice a day with spacer and rinse mouth afterwards. ? Continue Singulair 10mg  daily   Spacer given and demonstrated proper use with inhaler. Patient understood technique and all questions/concerned were addressed.   Prior to physical activity:May use albuterol rescue inhaler 2 puffs 5 to 15 minutes prior to strenuous physical activities.  Rescue medications:May use albuterol rescue inhaler 2 puffs or nebulizer every 4 to 6 hours  as needed for shortness of breath, chest tightness, coughing, and wheezing. Monitor frequency of use.  Seasonal and perennial allergic rhinitis Past history - 2018 skin testing was positive to dust mites and tree pollen. Interim history - nasal congestion, azelastine burns so stopped using it.  Continue environmental control measures.  Use Flonase (fluticasone) nasal spray 1 spray per nostril twice a day as needed for nasal congestion.   Nasal saline spray (i.e., Simply Saline) or nasal saline lavage (i.e., NeilMed) is recommended as needed and prior to medicated nasal sprays.  May use over the counter antihistamines such as Zyrtec (cetirizine), Claritin (loratadine), Allegra (fexofenadine), or Xyzal (levocetirizine) daily as needed.  LPRD (laryngopharyngeal reflux disease) Stable.   Continue Nexium 40mg  daily.  Continue pepcid 40mg  daily.    Bee sting allergy No stings since last visit.   For mild symptoms you can take over the counter antihistamines such as Benadryl and monitor symptoms closely. If symptoms worsen or if you have severe symptoms including breathing issues, throat closure, significant swelling, whole body hives, severe diarrhea and vomiting, lightheadedness then inject epinephrine and seek immediate medical care afterwards.  Consider testing in future.  Return in about 6 months (around 07/25/2021).  Meds ordered this encounter  Medications  . fluticasone (FLONASE) 50 MCG/ACT nasal spray    Sig: Place 1 spray into both nostrils in the morning and at bedtime.    Dispense:  48 g    Refill:  2  . montelukast (SINGULAIR) 10 MG tablet    Sig: Take 1 tablet (10 mg total) by mouth daily.    Dispense:  90 tablet    Refill:  2  . PROAIR HFA 108 (  90 Base) MCG/ACT inhaler    Sig: Inhale 2 puffs into the lungs every 4 (four) hours as needed for wheezing or shortness of breath.    Dispense:  54 g    Refill:  0  . EPINEPHrine 0.3 mg/0.3 mL IJ SOAJ injection    Sig:  Inject 0.3 mg into the muscle once as needed for anaphylaxis.    Dispense:  1 each    Refill:  1   Lab Orders  No laboratory test(s) ordered today    Diagnostics: Spirometry:  Tracings reviewed. Her effort: It was hard to get consistent efforts and there is a question as to whether this reflects a maximal maneuver. FVC: 1.93L FEV1: 1.42L, 87% predicted FEV1/FVC ratio: 74% Interpretation: No overt abnormalities noted given today's efforts.  Please see scanned spirometry results for details.  Medication List:  Current Outpatient Medications  Medication Sig Dispense Refill  . ACCU-CHEK FASTCLIX LANCETS MISC TEST THREE TIMES DAILY 306 each 12  . acyclovir (ZOVIRAX) 200 MG capsule TAKE 1 CAPSULE BY MOUTH TWO TIMES DAILY 180 capsule 3  . albuterol (VENTOLIN HFA) 108 (90 Base) MCG/ACT inhaler USE 2 PUFFS BY MOUTH INTO  THE LUNGS EVERY 4 HOURS AS  NEEDED FOR WHEEZING OR  SHORTNESS OF BREATH 51 g 1  . Alcohol Swabs (B-D SINGLE USE SWABS REGULAR) PADS USE THREE TIMES DAILY 300 each 3  . allopurinol (ZYLOPRIM) 300 MG tablet TAKE 1 TABLET BY MOUTH  DAILY 90 tablet 3  . ammonium lactate (LAC-HYDRIN) 12 % lotion APPLY 1 APPLICATION TOPICALLY TWICE DAILY AS NEEDED FOR DRY SKIN (SUBSTITUTED FOR LAC HYDRIN) 400 g 12  . aspirin EC 81 MG tablet Take 81 mg by mouth daily.    [name] atorvastatin (LIPITOR) 40 MG tablet TAKE 1 TABLET BY MOUTH  DAILY 90 tablet 3  . B-D ULTRAFINE III SHORT PEN 31G X 8 MM MISC USE TO INJECT THREE TIMES A DAY 90 each 3  . bisacodyl (DULCOLAX) 10 MG suppository Place 1 suppository (10 mg total) rectally as needed for moderate constipation. 12 suppository 0  . budesonide-formoterol (SYMBICORT) 160-4.5 MCG/ACT inhaler USE 2 INHALATIONS BY MOUTH  TWICE DAILY - RINSE,  GARGLE, AND SPIT AFTER USE 30.6 g 3  . buPROPion (WELLBUTRIN) 100 MG tablet Take 1 tablet (100 mg total) by mouth 2 (two) times daily. 180 tablet 0  . COMBIGAN 0.2-0.5 % ophthalmic solution INSTILL 1 DROP INTO THE  LEFT  EYE EVERY 12 HOURS 5 mL 12  . DULoxetine (CYMBALTA) 60 MG capsule Take 1 capsule (60 mg total) by mouth daily. 90 capsule 0  . esomeprazole (NEXIUM) 40 MG capsule TAKE 1 CAPSULE BY MOUTH  DAILY 90 capsule 3  . ezetimibe (ZETIA) 10 MG tablet TAKE 1 TABLET BY MOUTH  DAILY 90 tablet 3  . famotidine (PEPCID) 40 MG tablet TAKE 1 TABLET BY MOUTH  DAILY 90 tablet 0  . furosemide (LASIX) 40 MG tablet Take one tablet by mouth daily 90 tablet 3  . glucose blood (PRODIGY NO CODING BLOOD GLUC) test strip USE AS INSTRUCTED TO CHECK BLOOD SUGAR THREE TIMES A DAY 300 each 12  . HUMALOG KWIKPEN 100 UNIT/ML KwikPen INJECT SUBCUTANEOUSLY 35  UNITS TWICE DAILY AFTER  MEALS AS DIRECTED 75 mL 3  . insulin glargine (LANTUS SOLOSTAR) 100 UNIT/ML Solostar Pen INJECT SUBCUTANEOUSLY 35  UNITS TWICE DAILY 75 mL 3  . Insulin Syringe-Needle U-100 (BD INSULIN SYRINGE ULTRAFINE) 31G X 15/64" 0.5 ML MISC Use to inject three times a  day 300 each 2  . latanoprost (XALATAN) 0.005 % ophthalmic solution Place 1 drop into both eyes at bedtime. 2.5 mL 3  . levocetirizine (XYZAL) 5 MG tablet Take 1 tablet (5 mg total) by mouth every evening. As needed for the hives/rash. 90 tablet 2  . LINZESS 290 MCG CAPS capsule Take 290 mcg by mouth daily as needed.     [name] losartan (COZAAR) 50 MG tablet Take 2 tablets (100 mg total) by mouth daily. 90 tablet 3  . metoCLOPramide (REGLAN) 5 MG tablet TAKE 1 TABLET BY MOUTH 4  TIMES DAILY 360 tablet 3  . metoprolol succinate (TOPROL-XL) 50 MG 24 hr tablet TAKE 3 TABLETS BY MOUTH  DAILY WITH OR IMMEDIATELY  FOLLOWING A MEAL 270 tablet 3  . Multiple Vitamins-Minerals (MULTIVITAMIN PO) Take 1 tablet by mouth daily.    . permethrin (ELIMITE) 5 % cream Apply from neck down to entire body except genital area and leave on fo0r 8 hours then rinse off and repeat in one week 60 g 0  . RESTASIS MULTIDOSE 0.05 % ophthalmic emulsion Place 1 drop into both eyes daily as needed.  3  . sertraline (ZOLOFT) 100 MG tablet  Take 100 mg by mouth daily.    [name] spironolactone (ALDACTONE) 25 MG tablet TAKE 1 TABLET BY MOUTH  DAILY BEFORE SUPPER 90 tablet 3  . triamcinolone (KENALOG) 0.1 % Apply 1 application topically 2 (two) times daily. 80 g 5  . TRULICITY 1.5 AY/3.0ZS SOPN INJECT THE CONTENTS OF ONE  PEN SUBCUTANEOUSLY WEEKLY 6 mL 3  . EPINEPHrine 0.3 mg/0.3 mL IJ SOAJ injection Inject 0.3 mg into the muscle once as needed for anaphylaxis. 1 each 1  . fluticasone (FLONASE) 50 MCG/ACT nasal spray Place 1 spray into both nostrils in the morning and at bedtime. 48 g 2  . montelukast (SINGULAIR) 10 MG tablet Take 1 tablet (10 mg total) by mouth daily. 90 tablet 2  . PROAIR HFA 108 (90 Base) MCG/ACT inhaler Inhale 2 puffs into the lungs every 4 (four) hours as needed for wheezing or shortness of breath. 54 g 0   No current facility-administered medications for this visit.   Allergies: Allergies  Allergen Reactions  . Bee Venom Hives and Swelling  . Propoxyphene Hcl Itching  . Amlodipine Besylate Swelling  . Hydrocodone Other (See Comments)    With Vicodin - makes patient "jittery", and "hangover effect - sleepy next day"  . Lisinopril Cough    Changed to ARB  . Metformin And Related Diarrhea    GI distress  . Metronidazole Other (See Comments)    REACTION: "just didn't work; my body never did heal from it"  . Valsartan Other (See Comments)    REACTION:  "sleep more the next day after I took it; it made me real tired"  . Propoxyphene     Other reaction(s): Unknown  . Sulfasalazine     Other reaction(s): Unknown  . Xyzal [Levocetirizine Dihydrochloride] Rash   I reviewed her past medical history, social history, family history, and environmental history and no significant changes have been reported from her previous visit.  Review of Systems  Constitutional: Negative for appetite change, chills, fever and unexpected weight change.  HENT: Negative for congestion and rhinorrhea.   Eyes: Negative for itching.   Respiratory: Negative for cough, chest tightness, shortness of breath and wheezing.   Gastrointestinal: Negative for abdominal pain.  Skin: Negative for rash.  Allergic/Immunologic: Positive for environmental allergies. Negative for food allergies.  Neurological: Negative for headaches.   Objective: BP 128/86 (BP Location: Right Arm, Patient Position: Sitting, Cuff Size: Normal)   Pulse 70   Temp 97.8 F (36.6 C) (Temporal)   Resp 16   Ht 5\' 1"  (1.549 m)   Wt 182 lb 9.6 oz (82.8 kg)   SpO2 99%   BMI 34.50 kg/m  Body mass index is 34.5 kg/m. Physical Exam Vitals and nursing note reviewed.  Constitutional:      Appearance: She is well-developed.  HENT:     Head: Normocephalic and atraumatic.     Right Ear: External ear normal.     Left Ear: External ear normal.     Nose: Nose normal.  Eyes:     Conjunctiva/sclera: Conjunctivae normal.  Cardiovascular:     Rate and Rhythm: Normal rate and regular rhythm.     Heart sounds: Normal heart sounds. No murmur heard. No friction rub. No gallop.   Pulmonary:     Effort: Pulmonary effort is normal.     Breath sounds: Normal breath sounds. No wheezing or rales.  Musculoskeletal:     Cervical back: Neck supple.  Skin:    General: Skin is warm.     Findings: No rash.  Neurological:     Mental Status: She is alert and oriented to person, place, and time.  Psychiatric:        Behavior: Behavior normal.    Previous notes and tests were reviewed. The plan was reviewed with the patient/family, and all questions/concerned were addressed.  It was my pleasure to see [name] [name] today and participate in her care. Please feel free to contact me with any questions or concerns.  Sincerely,  Rexene Alberts, DO Allergy & Immunology  [facility] office: [place] office: [phone]

## 2021-01-22 NOTE — Assessment & Plan Note (Signed)
Stable.  °· Continue Nexium 40mg daily. °· Continue pepcid 40mg daily.   °

## 2021-01-22 NOTE — Assessment & Plan Note (Signed)
Past history - 2018 skin testing was positive to dust mites and tree pollen. Interim history - nasal congestion, azelastine burns so stopped using it.  Continue environmental control measures.  Use Flonase (fluticasone) nasal spray 1 spray per nostril twice a day as needed for nasal congestion.   Nasal saline spray (i.e., Simply Saline) or nasal saline lavage (i.e., NeilMed) is recommended as needed and prior to medicated nasal sprays.  May use over the counter antihistamines such as Zyrtec (cetirizine), Claritin (loratadine), Allegra (fexofenadine), or Xyzal (levocetirizine) daily as needed.

## 2021-01-22 NOTE — Patient Instructions (Addendum)
Moderate persistent asthma   Daily controller medication(s):continue Symbicort 150mcg 2 puffs twice a day with spacer and rinse mouth afterwards. ? Continue Singulair 10mg  daily   Spacer given and demonstrated proper use with inhaler. Patient understood technique and all questions/concerned were addressed.   Prior to physical activity:May use albuterol rescue inhaler 2 puffs 5 to 15 minutes prior to strenuous physical activities.  Rescue medications:May use albuterol rescue inhaler 2 puffs or nebulizer every 4 to 6 hours as needed for shortness of breath, chest tightness, coughing, and wheezing. Monitor frequency of use. Asthma control goals:  Full participation in all desired activities (may need albuterol before activity) Albuterol use two times or less a week on average (not counting use with activity) Cough interfering with sleep two times or less a month Oral steroids no more than once a year No hospitalizations  Seasonal and perennial allergic rhinitis Past history - 2018 skin testing was positive to dust mites and tree pollen.  Continue environmental control measures.  Use Flonase (fluticasone) nasal spray 1 spray per nostril twice a day as needed for nasal congestion.   Nasal saline spray (i.e., Simply Saline) or nasal saline lavage (i.e., NeilMed) is recommended as needed and prior to medicated nasal sprays.  May use over the counter antihistamines such as Zyrtec (cetirizine), Claritin (loratadine), Allegra (fexofenadine), or Xyzal (levocetirizine) daily as needed.  LPRD (laryngopharyngeal reflux disease)  Continue Nexium 40mg  daily.  Continue pepcid 40mg  daily.    Bee sting allergy  For mild symptoms you can take over the counter antihistamines such as Benadryl and monitor symptoms closely. If symptoms worsen or if you have severe symptoms including breathing issues, throat closure, significant swelling, whole body hives, severe diarrhea and vomiting,  lightheadedness then inject epinephrine and seek immediate medical care afterwards.  Consider testing in future.  Follow up in 6 months or sooner if needed.   Control of House Dust Mite Allergen . Dust mite allergens are a common trigger of allergy and asthma symptoms. While they can be found throughout the house, these microscopic creatures thrive in warm, humid environments such as bedding, upholstered furniture and carpeting. . Because so much time is spent in the bedroom, it is essential to reduce mite levels there.  . Encase pillows, mattresses, and box springs in special allergen-proof fabric covers or airtight, zippered plastic covers.  . Bedding should be washed weekly in hot water (130 F) and dried in a hot dryer. Allergen-proof covers are available for comforters and pillows that can't be regularly washed.  Wendee Copp the allergy-proof covers every few months. Minimize clutter in the bedroom. Keep pets out of the bedroom.  [name] Keep humidity less than 50% by using a dehumidifier or air conditioning. You can buy a humidity measuring device called a hygrometer to monitor this.  . If possible, replace carpets with hardwood, linoleum, or washable area rugs. If that's not possible, vacuum frequently with a vacuum that has a HEPA filter. . Remove all upholstered furniture and non-washable window drapes from the bedroom. . Remove all non-washable stuffed toys from the bedroom.  Wash stuffed toys weekly. Reducing Pollen Exposure . Pollen seasons: trees (spring), grass (summer) and ragweed/weeds (fall). [name] Keep windows closed in your home and car to lower pollen exposure.  Susa Simmonds air conditioning in the bedroom and throughout the house if possible.  . Avoid going out in dry windy days - especially early morning. . Pollen counts are highest between 5 - 10 AM and on dry, hot  and windy days.  . Save outside activities for late afternoon or after a heavy rain, when pollen levels are lower.  . Avoid  mowing of grass if you have grass pollen allergy. [name] Be aware that pollen can also be transported indoors on people and pets.  . Dry your clothes in an automatic dryer rather than hanging them outside where they might collect pollen.  . Rinse hair and eyes before bedtime.

## 2021-01-24 DIAGNOSIS — L648 Other androgenic alopecia: Secondary | ICD-10-CM | POA: Diagnosis not present

## 2021-01-24 DIAGNOSIS — L958 Other vasculitis limited to the skin: Secondary | ICD-10-CM | POA: Diagnosis not present

## 2021-01-24 DIAGNOSIS — D485 Neoplasm of uncertain behavior of skin: Secondary | ICD-10-CM | POA: Diagnosis not present

## 2021-01-31 NOTE — Telephone Encounter (Signed)
Clinical info completed on PCS form.  Place form in Dr. Verlon Au box for completion.  Zykia Walla Zimmerman Rumple, CMA

## 2021-02-05 ENCOUNTER — Telehealth: Payer: Self-pay | Admitting: Family Medicine

## 2021-02-05 NOTE — Telephone Encounter (Signed)
Administrater of health care.  [facility] is looking for completetion of form note in system states form was being forwarded for Dr Nori Riis to complete.  Lady asking for form her name is [name] [name] Please call her she states it should be completed by now Please call her [phone]

## 2021-02-08 DIAGNOSIS — L959 Vasculitis limited to the skin, unspecified: Secondary | ICD-10-CM | POA: Diagnosis not present

## 2021-02-14 ENCOUNTER — Ambulatory Visit: Payer: Medicare Other | Admitting: Licensed Clinical Social Worker

## 2021-02-14 DIAGNOSIS — Z719 Counseling, unspecified: Secondary | ICD-10-CM

## 2021-02-14 NOTE — Chronic Care Management (AMB) (Signed)
  Care Management   Clinical Social Work Phone Note  02/14/2021 Name: [name] [name] MRN: [id number] DOB: [birth year] [name] [name] is a 70 y.o. year old female who sees Dickie La, MD for primary care.    The Care Management team was consulted to assess patient's needs after disconnection with services provided by [facility].  Assessment: Patient is engaged in conversation, reports she is doing ok,  [facility] was scheduled to start but never started.  She is currently working to get personal care services.  Discussed other services and options.  Patient is not interested at this time.  She will continue with the PCS process to see if she is eligible, it not patient states she will follow up with PCP to discuss [place] with PT.  Follow up Plan: Patient does not require or desire continued follow-up. Will contact the office if needed   Review of patient status, including review of consultants reports, relevant laboratory and other test results, and collaboration with appropriate care team members and the patient's provider was performed as part of comprehensive patient evaluation and provision of chronic care management services.       Casimer Lanius, [place] / [place]   [phone] 9:14 AM

## 2021-02-21 DIAGNOSIS — L959 Vasculitis limited to the skin, unspecified: Secondary | ICD-10-CM | POA: Diagnosis not present

## 2021-02-27 ENCOUNTER — Other Ambulatory Visit: Payer: Self-pay | Admitting: Family Medicine

## 2021-02-28 ENCOUNTER — Encounter: Payer: Self-pay | Admitting: Podiatry

## 2021-02-28 ENCOUNTER — Ambulatory Visit (INDEPENDENT_AMBULATORY_CARE_PROVIDER_SITE_OTHER): Payer: Medicare Other | Admitting: Podiatry

## 2021-02-28 ENCOUNTER — Other Ambulatory Visit: Payer: Self-pay

## 2021-02-28 DIAGNOSIS — B351 Tinea unguium: Secondary | ICD-10-CM | POA: Diagnosis not present

## 2021-02-28 DIAGNOSIS — L84 Corns and callosities: Secondary | ICD-10-CM | POA: Diagnosis not present

## 2021-02-28 DIAGNOSIS — R1032 Left lower quadrant pain: Secondary | ICD-10-CM | POA: Insufficient documentation

## 2021-02-28 DIAGNOSIS — M79675 Pain in left toe(s): Secondary | ICD-10-CM | POA: Diagnosis not present

## 2021-02-28 DIAGNOSIS — M79674 Pain in right toe(s): Secondary | ICD-10-CM

## 2021-02-28 DIAGNOSIS — E1142 Type 2 diabetes mellitus with diabetic polyneuropathy: Secondary | ICD-10-CM

## 2021-03-01 ENCOUNTER — Other Ambulatory Visit (HOSPITAL_COMMUNITY): Payer: Self-pay | Admitting: Psychiatry

## 2021-03-01 DIAGNOSIS — F330 Major depressive disorder, recurrent, mild: Secondary | ICD-10-CM

## 2021-03-01 DIAGNOSIS — F411 Generalized anxiety disorder: Secondary | ICD-10-CM

## 2021-03-01 NOTE — Progress Notes (Signed)
Subjective: [name] [name] presents today at risk foot care with history of diabetic neuropathy and painful callus(es) b/l and painful mycotic toenails b/l that are difficult to trim. Pain interferes with ambulation. Aggravating factors include wearing enclosed shoe gear. Pain is relieved with periodic professional debridement.   She states she has been doing a better job of moisturizing her heels.  Patient's blood glucose was 127this morning.   Dickie La, MD is patient's PCP. Last visit was: 01/10/2021.  Allergies  Allergen Reactions   Bee Venom Hives and Swelling   Propoxyphene Hcl Itching   Amlodipine Besylate Swelling   Hydrocodone Other (See Comments)    With Vicodin - makes patient "jittery", and "hangover effect - sleepy next day"   Lisinopril Cough    Changed to ARB   Metformin And Related Diarrhea    GI distress   Metronidazole Other (See Comments)    REACTION: "just didn't work; my body never did heal from it"   Valsartan Other (See Comments)    REACTION:  "sleep more the next day after I took it; it made me real tired"   Propoxyphene     Other reaction(s): Unknown   Sulfasalazine     Other reaction(s): Unknown   Xyzal [Levocetirizine Dihydrochloride] Rash    Objective: [name] [name] is a pleasant 70 y.o.  African American female  in NAD. AAO x 3.  There were no vitals filed for this visit.  Vascular Examination: Neurovascular status unchanged b/l lower extremities. Capillary refill time to digits immediate b/l. Palpable DP pulses b/l. Palpable PT pulses b/l. Pedal hair absent b/l. Skin temperature gradient within normal limits b/l.  Dermatological Examination: Pedal skin with normal turgor, texture and tone bilaterally. No open wounds bilaterally. No interdigital macerations bilaterally. Toenails 1-5 b/l elongated, discolored, dystrophic, thickened, crumbly with subungual debris and tenderness to dorsal palpation. Hyperkeratotic lesion(s) submet  head 1 right foot, submet head 4 left foot and plantar aspect b/l heel pads.  No erythema, no edema, no drainage, no flocculence.  Musculoskeletal: Normal muscle strength 5/5 to all lower extremity muscle groups bilaterally. No pain crepitus or joint limitation noted with ROM b/l. Hammertoes noted to the 2-5 bilaterally. Pes planus deformity noted b/l.  Utilizes rollator for ambulation assistance.  Neurological Examination: Pt has subjective symptoms of neuropathy. Protective sensation intact 5/5 intact bilaterally with 10g monofilament b/l. Vibratory sensation intact b/l. Proprioception intact bilaterally.  Assessment: 1. Pain due to onychomycosis of toenails of both feet   2. Callus   3. Diabetic peripheral neuropathy associated with type 2 diabetes mellitus (HCC)    Plan: -Toenails 1-5 b/l were debrided in length and girth with sterile nail nippers and dremel without iatrogenic bleeding.  -Callus(es) b/l heels, submet head 1 right foot and submet head 4 left foot pared utilizing sterile scalpel blade without complication or incident. Total number debrided =4. -Patient to report any pedal injuries to medical professional immediately. -Patient/POA to call should there be question/concern in the interim.  Return in about 3 months (around 05/31/2021).  Marzetta Board, DPM

## 2021-03-06 ENCOUNTER — Encounter: Payer: Self-pay | Admitting: Family Medicine

## 2021-03-06 NOTE — Progress Notes (Signed)
Received note from [facility] dermatology. 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 I reviewed their labs and have scanned them to chart. Signififcant mainly for protein of 528 mg per 24 hour with their normal cut off of 150. pH of urine 9. I do not see a biopsy result.  She has known DM with some hx of poor control although last A 1 C was 6.6, but she had several months (years) of being in 8+ range. I suspect this excess protein related to DM. She is already on ARB.

## 2021-03-07 ENCOUNTER — Telehealth: Payer: Self-pay | Admitting: Family Medicine

## 2021-03-07 NOTE — Telephone Encounter (Signed)
Patient called stating that her dermatology clinic is sending over papers for doctor and she is needing Dr. Nori Riis to call her as soon as she can, she is needing to speak with her. She didn't state what she was needing to talk about I am guessing it is the papers. Please advise. Thanks!

## 2021-03-07 NOTE — Telephone Encounter (Signed)
Left voice message.  I did receive the notes from dermatology.  Looks like they were working her up for vasculitis.  Work-up was essentially negative although I did not see the final biopsy report.  I suspect she is worried about this.  The message I left her said that I did not see anything worrisome and that I would try to call her tomorrow when I get back in clinic.

## 2021-03-19 DIAGNOSIS — H5310 Unspecified subjective visual disturbances: Secondary | ICD-10-CM | POA: Diagnosis not present

## 2021-03-19 DIAGNOSIS — H401133 Primary open-angle glaucoma, bilateral, severe stage: Secondary | ICD-10-CM | POA: Diagnosis not present

## 2021-03-22 ENCOUNTER — Encounter: Payer: Medicare Other | Admitting: Pharmacist

## 2021-03-23 ENCOUNTER — Ambulatory Visit (INDEPENDENT_AMBULATORY_CARE_PROVIDER_SITE_OTHER): Payer: Medicare Other | Admitting: Pharmacist

## 2021-03-23 ENCOUNTER — Encounter: Payer: Self-pay | Admitting: Pharmacist

## 2021-03-23 VITALS — BP 154/75 | Wt 179.0 lb

## 2021-03-23 DIAGNOSIS — Z0000 Encounter for general adult medical examination without abnormal findings: Secondary | ICD-10-CM | POA: Diagnosis not present

## 2021-03-23 NOTE — Progress Notes (Signed)
Subjective:   [name] [name] is a 70 y.o. female who presents for Medicare Annual (Subsequent) preventive examination.  Patient consented to have virtual visit and was identified by name and date of birth. Method of visit: Telephone  Encounter participants: Patient: [name] [name] - located at home Nurse/Provider: Hughes Better - located at remote office Others (if applicable): N/A    Review of Systems:  Cardiac Risk Factors include: advanced age (>37mn, >>32women);hypertension;obesity (BMI >30kg/m2);sedentary lifestyle     Objective:     Vitals: BP (!) 154/75   Wt 179 lb (81.2 kg)   BMI 33.82 kg/m   Body mass index is 33.82 kg/m.  Advanced Directives 03/23/2021 09/06/2020 06/14/2020 01/15/2019 09/16/2018 06/03/2018 05/14/2018  Does Patient Have a Medical Advance Directive? No No No No No No No  Would patient like information on creating a medical advance directive? Yes (MAU/Ambulatory/Procedural Areas - Information given) No - Patient declined No - Patient declined No - Patient declined Yes (MAU/Ambulatory/Procedural Areas - Information given) No - Patient declined -  Pre-existing out of facility DNR order (yellow form or pink MOST form) - - - - - - -    Tobacco Social History   Tobacco Use  Smoking Status Former   Packs/day: 0.50   Years: 0.50   Pack years: 0.25   Types: Cigarettes   Quit date: 09/03/1975   Years since quitting: 45.5  Smokeless Tobacco Never  Tobacco Comments   no plans to start again     Counseling given: Not Answered Tobacco comments: no plans to start again   Clinical Intake:  Pre-visit preparation completed: Yes  Pain : No/denies pain     Nutritional Status: BMI > 30  Obese Diabetes: Yes CBG done?: No Did pt. bring in CBG monitor from home?: No  How often do you need to have someone help you when you read instructions, pamphlets, or other written materials from your doctor or pharmacy?: 1 - Never What is the last  grade level you completed in school?: 12th  Interpreter Needed?: No  Information entered by :: RHughes Better PharmD  Past Medical History:  Diagnosis Date   Allergy    Angina    Anxiety    Arthritis    Asthma    Blood transfusion    Blood transfusion without reported diagnosis    Cataract    Chronic cough    Now followed by pulmonary   Chronic lower back pain    Complication of anesthesia    "just wake up coughing; that's all"   Concussion 11/18/11   "fell at dr's office; hit head"   COPD (chronic obstructive pulmonary disease) [place]    Delayed gastric emptying    Depression    Diabetes mellitus type II    "take insulin & pills"   Dyslipidemia    Exertional dyspnea    GERD (gastroesophageal reflux disease)    Glaucoma    Gout    Headache(784.0) 11/18/11   "I've had mild headaches the last couple days"   Headache(784.0) 01/08/12   "pretty constant since 11/18/11's concussion"   Heart murmur    History of colonoscopy    HTN (hypertension)    Myocardial infarction [place]    Osteoporosis    Peripheral neuropathy    Pneumonia    "i've had it once" (11/18/11)   Sleep apnea    on CPAP 12   Syncope 06/16/2012   Vocal cord paralysis, unilateral partial    "right"  Past Surgical History:  Procedure Laterality Date   ANTERIOR CERVICAL DECOMP/DISCECTOMY FUSION  01/15/2012   Procedure: ANTERIOR CERVICAL DECOMPRESSION/DISCECTOMY FUSION 3 LEVELS;  Surgeon: Eustace Moore, MD;  Location: [place] [facility];  Service: Neurosurgery;  Laterality: N/A;  Anterior Cervical Decompression Discectomy Fusion Cerivcal three four, cervical four five, cervical five six, cervical six seven   BREAST BIOPSY     bilaterally   BREAST CYST EXCISION     right twice; left once   BREAST REDUCTION SURGERY  04-21-2003   BRONCHOSCOPY  07-2007   CARDIAC CATHETERIZATION  03-02-2004   CARPAL TUNNEL RELEASE     left   CATARACT EXTRACTION  1955; 1979   bilateral; right eye   [facility]    COLONOSCOPY W/ BIOPSIES  11/21/2010   adenoma polyp--no hi grade dysplasia Dr Collene Mares   EYE SURGERY     KNEE ARTHROSCOPY  08/2000   right   LEFT AND RIGHT HEART CATHETERIZATION WITH CORONARY ANGIOGRAM N/A 01/19/2014   Procedure: LEFT AND RIGHT HEART CATHETERIZATION WITH CORONARY ANGIOGRAM;  Surgeon: Sinclair Grooms, MD;  Location: [facility] C[facility];  Service: Cardiovascular;  Laterality: N/A;   REDUCTION MAMMAPLASTY Bilateral    SHOULDER ARTHROSCOPY  08/2000   right   SHOULDER ARTHROSCOPY W/ ROTATOR CUFF REPAIR  10/18/2003   left   SPINE SURGERY     T-score  09/02/04   -0.54 (low risk currently)   TOTAL ABDOMINAL HYSTERECTOMY  10-07-2000   TREATMENT FISTULA ANAL     TUBAL LIGATION  1987   Family History  Problem Relation Age of Onset   Diabetes Father    Heart disease Mother    Hypertension Sister    Hyperlipidemia Sister    Cancer Sister    Hypertension Brother    Hyperlipidemia Brother    Congestive Heart Failure Brother    Hypertension Sister    Colon cancer Neg Hx    Depression Neg Hx    Anxiety disorder Neg Hx    Social History   Socioeconomic History   Marital status: Single    Spouse name: Not on file   Number of children: 1   Years of education: 12   Highest education level: 12th grade  Occupational History   Occupation: unemployed    Fish farm manager: UNEMPLOYED  Tobacco Use   Smoking status: Former    Packs/day: 0.50    Years: 0.50    Pack years: 0.25    Types: Cigarettes    Quit date: 09/03/1975    Years since quitting: 45.5   Smokeless tobacco: Never   Tobacco comments:    no plans to start again  Vaping Use   Vaping Use: Never used  Substance and Sexual Activity   Alcohol use: No   Drug use: No   Sexual activity: Not Currently  Other Topics Concern   Not on file  Social History Narrative      Emergency Contact: sister, [name] [phone]      Who lives with you: Lives alone, in ground level apartment; has smoke alarms. No throw rugs on floor. Has  grab bars at tub and toilet.   Any pets: none   Diet: Pt has a varied diet of protein, starch, and vegetables.   Exercise: Not currently exercising   Seatbelts: Pt reports wearing seatbelt when in vehicles.    Hobbies: tv, church and church activities, volunteer work  Social Determinants of Health   Financial Resource Strain: Not on file  Food Insecurity: Not on file  Transportation Needs: Not on file  Physical Activity: Not on file  Stress: Not on file  Social Connections: Not on file    Outpatient Encounter Medications as of 03/23/2021  Medication Sig   ACCU-CHEK FASTCLIX LANCETS MISC TEST THREE TIMES DAILY   acyclovir (ZOVIRAX) 200 MG capsule TAKE 1 CAPSULE BY MOUTH TWO TIMES DAILY   albuterol (VENTOLIN HFA) 108 (90 Base) MCG/ACT inhaler USE 2 PUFFS BY MOUTH INTO  THE LUNGS EVERY 4 HOURS AS  NEEDED FOR WHEEZING OR  SHORTNESS OF BREATH   Alcohol Swabs (B-D SINGLE USE SWABS REGULAR) PADS USE THREE TIMES DAILY   allopurinol (ZYLOPRIM) 300 MG tablet TAKE 1 TABLET BY MOUTH  DAILY   aspirin EC 81 MG tablet Take 81 mg by mouth daily.   atorvastatin (LIPITOR) 40 MG tablet TAKE 1 TABLET BY MOUTH  DAILY   B-D ULTRAFINE III SHORT PEN 31G X 8 MM MISC USE TO INJECT THREE TIMES A DAY   budesonide-formoterol (SYMBICORT) 160-4.5 MCG/ACT inhaler USE 2 INHALATIONS BY MOUTH  TWICE DAILY - RINSE,  GARGLE, AND SPIT AFTER USE   buPROPion (WELLBUTRIN) 100 MG tablet TAKE 1 TABLET BY MOUTH  TWICE DAILY   COMBIGAN 0.2-0.5 % ophthalmic solution INSTILL 1 DROP INTO THE  LEFT EYE EVERY 12 HOURS   DULoxetine (CYMBALTA) 60 MG capsule TAKE 1 CAPSULE BY MOUTH  DAILY   esomeprazole (NEXIUM) 40 MG capsule TAKE 1 CAPSULE BY MOUTH  DAILY   ezetimibe (ZETIA) 10 MG tablet TAKE 1 TABLET BY MOUTH  DAILY   famotidine (PEPCID) 40 MG tablet TAKE 1 TABLET BY MOUTH  DAILY   fluticasone (FLONASE) 50 MCG/ACT nasal spray Place 1  spray into both nostrils in the morning and at bedtime.   furosemide (LASIX) 40 MG tablet Take one tablet by mouth daily   glucose blood (PRODIGY NO CODING BLOOD GLUC) test strip USE AS INSTRUCTED TO CHECK BLOOD SUGAR THREE TIMES A DAY   HUMALOG KWIKPEN 100 UNIT/ML KwikPen INJECT SUBCUTANEOUSLY 35  UNITS TWICE DAILY AFTER  MEALS AS DIRECTED   insulin glargine (LANTUS SOLOSTAR) 100 UNIT/ML Solostar Pen INJECT SUBCUTANEOUSLY 35  UNITS TWICE DAILY   Insulin Syringe-Needle U-100 (BD INSULIN SYRINGE ULTRAFINE) 31G X 15/64" 0.5 ML MISC Use to inject three times a day   latanoprost (XALATAN) 0.005 % ophthalmic solution Place 1 drop into both eyes at bedtime.   LINZESS 290 MCG CAPS capsule Take 290 mcg by mouth daily as needed.    losartan (COZAAR) 50 MG tablet Take 2 tablets (100 mg total) by mouth daily.   metoCLOPramide (REGLAN) 5 MG tablet TAKE 1 TABLET BY MOUTH 4  TIMES DAILY   metoprolol succinate (TOPROL-XL) 50 MG 24 hr tablet TAKE 3 TABLETS BY MOUTH  DAILY WITH OR IMMEDIATELY  FOLLOWING A MEAL   montelukast (SINGULAIR) 10 MG tablet Take 1 tablet (10 mg total) by mouth daily.   Multiple Vitamins-Minerals (MULTIVITAMIN PO) Take 1 tablet by mouth daily.   RESTASIS MULTIDOSE 0.05 % ophthalmic emulsion Place 1 drop into both eyes daily as needed.   sertraline (ZOLOFT) 100 MG tablet Take 100 mg by mouth daily.   triamcinolone (KENALOG) 0.1 % Apply 1 application topically 2 (two) times daily.   TRULICITY 1.5 0000000 SOPN INJECT THE CONTENTS OF ONE  PEN SUBCUTANEOUSLY WEEKLY   ammonium lactate (LAC-HYDRIN) 12 % lotion APPLY 1 APPLICATION TOPICALLY TWICE DAILY AS  NEEDED FOR DRY SKIN (SUBSTITUTED FOR LAC HYDRIN) (Patient not taking: Reported on 03/23/2021)   bisacodyl (DULCOLAX) 10 MG suppository Place 1 suppository (10 mg total) rectally as needed for moderate constipation. (Patient not taking: Reported on 03/23/2021)   EPINEPHrine 0.3 mg/0.3 mL IJ SOAJ injection Inject 0.3 mg into the muscle once as needed  for anaphylaxis. (Patient not taking: Reported on 03/23/2021)   fluocinolone (VANOS) 0.01 % cream Apply 1 a small amount to affected area twice a day as needed (Patient not taking: Reported on 03/23/2021)   Fluocinonide 0.1 % CREA Apply  a small amount to affected area twice a day as needed (Patient not taking: Reported on 03/23/2021)   levocetirizine (XYZAL) 5 MG tablet Take 1 tablet (5 mg total) by mouth every evening. As needed for the hives/rash.   permethrin (ELIMITE) 5 % cream Apply from neck down to entire body except genital area and leave on fo0r 8 hours then rinse off and repeat in one week   spironolactone (ALDACTONE) 25 MG tablet TAKE 1 TABLET BY MOUTH  DAILY BEFORE SUPPER   [DISCONTINUED] PROAIR HFA 108 (90 Base) MCG/ACT inhaler Inhale 2 puffs into the lungs every 4 (four) hours as needed for wheezing or shortness of breath.   No facility-administered encounter medications on file as of 03/23/2021.    Activities of Daily Living In your present state of health, do you have any difficulty performing the following activities: 03/23/2021  Hearing? Y  Vision? Y  Difficulty concentrating or making decisions? N  Walking or climbing stairs? Y  Dressing or bathing? N  Doing errands, shopping? Y  Preparing Food and eating ? N  Using the Toilet? N  In the past six months, have you accidently leaked urine? N  Do you have problems with loss of bowel control? N  Managing your Medications? N  Managing your Finances? N  Housekeeping or managing your Housekeeping? N  Some recent data might be hidden    Patient Care Team: Dickie La, MD as PCP - General Fay Records, MD as PCP - Cardiology (Cardiology) Juanita Craver, MD as Consulting Physician (Gastroenterology) Fay Records, MD as Consulting Physician (Cardiology) Kennith Center, RD as Dietitian (Family Medicine) Deneise Lever, MD as Consulting Physician (Pulmonary Disease) Edrick Kins, DPM as Consulting Physician  (Podiatry) Jackalyn Lombard, DMD (Dentistry) Leavy Cella, RPH-CPP (Pharmacist) Garnet Sierras, DO as Consulting Physician (Allergy) Charlcie Cradle, MD (Psychiatry) Orie Rout, MD as Referring Physician (Specialist) Jola Schmidt, MD as Consulting Physician (Ophthalmology)    Assessment:   This is a routine wellness examination for [facility].  Exercise Activities and Dietary recommendations Current Exercise Habits: The patient does not participate in regular exercise at present   Goals       Exercise 2x per week (15-30 min per time)      Weight < 186 lb (84.4 kg) (pt-stated)      7% weight loss         Fall Risk Fall Risk  03/23/2021 09/06/2020 06/14/2020 01/20/2020 11/17/2019  Falls in the past year? 1 0 0 0 0  Number falls in past yr: 1 0 1 0 0  Comment - - - - -  Injury with Fall? 0 0 0 - -  Comment - - - - -  Risk Factor Category  - - - - -  Comment - - - - -  Risk for fall due to : History of fall(s);Other (Comment) Impaired balance/gait - - -  Risk for fall due to: Comment Patient reports she believes it is due to low blood pressure - - - -  Follow up Falls prevention discussed;Falls evaluation completed - - Falls evaluation completed Falls evaluation completed  Comment - - - - -   Is the patient's home free of loose throw rugs in walkways, pet beds, electrical cords, etc?   yes      Grab bars in the bathroom? yes      Handrails on the stairs?   yes      Adequate lighting?   yes  Patient rating of health (0-10) scale: 9   Depression Screen PHQ 2/9 Scores 03/23/2021 01/10/2021 11/02/2020 09/06/2020  PHQ - 2 Score 1 0 0 -  PHQ- 9 Score - 4 - -  Exception Documentation - - - Patient refusal  Not completed - - - -     Cognitive Function MMSE - Mini Mental State Exam 09/16/2018 05/27/2012  Orientation to time 5 5  Orientation to Place 5 5  Registration 3 3  Attention/ Calculation 5 5  Recall 3 3  Language- name 2 objects 2 2  Language- repeat 1 1  Language-  follow 3 step command 3 3  Language- read & follow direction 1 1  Write a sentence 1 1  Copy design 1 1  Total score 30 30     6CIT Screen 03/23/2021 09/16/2018  What Year? 0 points 0 points  What month? 0 points 0 points  What time? 0 points 0 points  Count back from 20 0 points 0 points  Months in reverse 0 points 0 points  Repeat phrase 0 points 0 points  Total Score 0 0    Immunization History  Administered Date(s) Administered   H1N1 08/11/2008   Influenza Split 05/17/2011, 05/27/2012   Influenza Whole 05/25/2007, 06/08/2008, 06/02/2009, 06/05/2010   Influenza,inj,Quad PF,6+ Mos 04/28/2013, 06/01/2014, 05/18/2015, 05/17/2016, 04/30/2017, 06/03/2018, 07/14/2019, 06/14/2020   PFIZER Comirnaty(Gray Top)Covid-19 Tri-Sucrose Vaccine 01/10/2021   PFIZER(Purple Top)SARS-COV-2 Vaccination 01/14/2020, 02/04/2020, 09/06/2020   Pneumococcal Conjugate-13 10/20/2013   Pneumococcal Polysaccharide-23 06/02/1993, 06/17/2012, 03/19/2018   Td 10/31/1996, 03/02/2008     Screening Tests Health Maintenance  Topic Date Due   Zoster Vaccines- Shingrix (1 of 2) Never done   TETANUS/TDAP  03/02/2018   INFLUENZA VACCINE  04/02/2021   HEMOGLOBIN A1C  07/13/2021   FOOT EXAM  01/10/2022   OPHTHALMOLOGY EXAM  03/21/2022   MAMMOGRAM  12/14/2022   COLONOSCOPY (Pts 45-64yr Insurance coverage will need to be confirmed)  03/15/2027   DEXA SCAN  Completed   COVID-19 Vaccine  Completed   Hepatitis C Screening  Completed   PNA vac Low Risk Adult  Completed   HPV VACCINES  Aged Out    Cancer Screenings: Lung: Low Dose CT Chest recommended if Age 69-80years, 30 pack-year currently smoking OR have quit w/in 15years. Patient does not qualify. Breast:  Up to date on Mammogram? Yes   Up to date of Bone Density/Dexa? Yes Colorectal: Yes  Additional Screenings: : Hepatitis C Screening: Completed     Plan:   Recommended patient obtain Shingrix and Td vaccinations. Patient willing to consider  receiving vaccines. Also discussed with patient importance of reaching out to provider for appt if her blood pressure continues to remain elevated above goal at home.   I have personally reviewed and noted the following in the patient's chart:   Medical and social history Use of alcohol, tobacco or illicit drugs  Current medications and  supplements Functional ability and status Nutritional status Physical activity Advanced directives List of other physicians Hospitalizations, surgeries, and ER visits in previous 12 months Vitals Screenings to include cognitive, depression, and falls Referrals and appointments  In addition, I have reviewed and discussed with patient certain preventive protocols, quality metrics, and best practice recommendations. A written personalized care plan for preventive services as well as general preventive health recommendations were provided to patient.    This visit was conducted virtually in the setting of the [place] pandemic.    Hughes Better, RPH-CPP  03/23/2021

## 2021-03-23 NOTE — Patient Instructions (Addendum)
You spoke to [name] [name], [name] [name] over the phone for your annual wellness visit.  We discussed goals:   Goals       Exercise 2x per week (15-30 min per time)      Weight < 186 lb (84.4 kg) (pt-stated)      7% weight loss         We also discussed recommended health maintenance. Please call our office and schedule a visit. As discussed, you are due for: Health Maintenance  Topic Date Due   Zoster Vaccines- Shingrix (1 of 2) Never done   TETANUS/TDAP  03/02/2018   INFLUENZA VACCINE  04/02/2021   HEMOGLOBIN A1C  07/13/2021   FOOT EXAM  01/10/2022   OPHTHALMOLOGY EXAM  03/21/2022   MAMMOGRAM  12/14/2022   COLONOSCOPY (Pts 45-43yr Insurance coverage will need to be confirmed)  03/15/2027   DEXA SCAN  Completed   COVID-19 Vaccine  Completed   Hepatitis C Screening  Completed   PNA vac Low Risk Adult  Completed   HPV VACCINES  Aged Out   We also discussed speaking with Dr. NNori Riisregarding your blood pressure readings as they appear to be slightly elevated at home.   Our clinic's number is [phone] Please call with questions or concerns about what we discussed today.   Diabetes Mellitus and Foot Care Foot care is an important part of your health, especially when you have diabetes. Diabetes may cause you to have problems because of poor blood flow (circulation) to your feet and legs, which can cause your skin to: Become thinner and drier. Break more easily. Heal more slowly. Peel and crack. You may also have nerve damage (neuropathy) in your legs and feet, causing decreased feeling in them. This means that you may not notice minor injuries to your feet that could lead to more serious problems. Noticing and addressing any potential problems early is the best wayto prevent future foot problems. How to care for your feet Foot hygiene  Wash your feet daily with warm water and mild soap. Do not use hot water. Then, pat your feet and the areas between your toes until they are  completely dry. Do not soak your feet as this can dry your skin. Trim your toenails straight across. Do not dig under them or around the cuticle. File the edges of your nails with an emery board or nail file. Apply a moisturizing lotion or petroleum jelly to the skin on your feet and to dry, brittle toenails. Use lotion that does not contain alcohol and is unscented. Do not apply lotion between your toes.  Shoes and socks Wear clean socks or stockings every day. Make sure they are not too tight. Do not wear knee-high stockings since they may decrease blood flow to your legs. Wear shoes that fit properly and have enough cushioning. Always look in your shoes before you put them on to be sure there are no objects inside. To break in new shoes, wear them for just a few hours a day. This prevents injuries on your feet. Wounds, scrapes, corns, and calluses  Check your feet daily for blisters, cuts, bruises, sores, and redness. If you cannot see the bottom of your feet, use a mirror or ask someone for help. Do not cut corns or calluses or try to remove them with medicine. If you find a minor scrape, cut, or break in the skin on your feet, keep it and the skin around it clean and dry. You may clean  these areas with mild soap and water. Do not clean the area with peroxide, alcohol, or iodine. If you have a wound, scrape, corn, or callus on your foot, look at it several times a day to make sure it is healing and not infected. Check for: Redness, swelling, or pain. Fluid or blood. Warmth. Pus or a bad smell.  General tips Do not cross your legs. This may decrease blood flow to your feet. Do not use heating pads or hot water bottles on your feet. They may burn your skin. If you have lost feeling in your feet or legs, you may not know this is happening until it is too late. Protect your feet from hot and cold by wearing shoes, such as at the beach or on hot pavement. Schedule a complete foot exam at least  once a year (annually) or more often if you have foot problems. Report any cuts, sores, or bruises to your health care provider immediately. Where to find more information American Diabetes Association: www.diabetes.org Association of Diabetes Care & Education Specialists: www.diabeteseducator.org Contact a health care provider if: You have a medical condition that increases your risk of infection and you have any cuts, sores, or bruises on your feet. You have an injury that is not healing. You have redness on your legs or feet. You feel burning or tingling in your legs or feet. You have pain or cramps in your legs and feet. Your legs or feet are numb. Your feet always feel cold. You have pain around any toenails. Get help right away if: You have a wound, scrape, corn, or callus on your foot and: You have pain, swelling, or redness that gets worse. You have fluid or blood coming from the wound, scrape, corn, or callus. Your wound, scrape, corn, or callus feels warm to the touch. You have pus or a bad smell coming from the wound, scrape, corn, or callus. You have a fever. You have a red line going up your leg. Summary Check your feet every day for blisters, cuts, bruises, sores, and redness. Apply a moisturizing lotion or petroleum jelly to the skin on your feet and to dry, brittle toenails. Wear shoes that fit properly and have enough cushioning. If you have foot problems, report any cuts, sores, or bruises to your health care provider immediately. Schedule a complete foot exam at least once a year (annually) or more often if you have foot problems. This information is not intended to replace advice given to you by your health care provider. Make sure you discuss any questions you have with your healthcare provider. Document Revised: 03/09/2020 Document Reviewed: 03/09/2020 Elsevier Patient Education  2022 [place] for Type 2 Diabetes  A screening test for type 2  diabetes (type 2 diabetes mellitus) is a blood test to measure your blood sugar (glucose) level. This test is done to check for early signs of diabetes, before youdevelop symptoms.  Type 2 diabetes is a long-term (chronic) disease. In type 2 diabetes, one or both of these problems may be present: The pancreas does not make enough of a hormone called insulin. Cells in the body do not respond properly to insulin that the body makes (insulin resistance). Normally, insulin allows blood sugar (glucose) to enter cells in the body. The cells use glucose for energy. Insulin resistance or lack of insulin causes excess glucose to build up in the blood instead of going into cells. This results in high blood glucose levels (hyperglycemia), which can  cause many complications. You may be screened for type 2 diabetes as part of your regular health care, especially if you have a high risk for diabetes. Screening can help to identify type 2 diabetes at its early stage (prediabetes). Identifying and treating prediabetes may delay or prevent the development oftype 2 diabetes. What are the risk factors for type 2 diabetes? The following factors may make you more likely to develop type 2 diabetes: Having a parent or sibling (first-degree relative) who has diabetes. Being overweight or obese. Being of American-Indian, African-American, Hispanic, Latino, Asian, or [place] descent. Not getting enough exercise (having a sedentary lifestyle). Being older than age 42. Having a history of diabetes during pregnancy (gestational diabetes). Having low levels of good cholesterol (HDL-C) or high levels of blood fats (triglycerides). Having high blood glucose in a previous blood test. Having high blood pressure. Having certain diseases or conditions that may be caused by insulin resistance, including: Acanthosis nigricans. This is a condition that causes dark skin on the neck, armpits, and groin. Polycystic ovary syndrome  (PCOS). Cardiovascular heart disease. Who should be screened for type 2 diabetes? Adults Adults age 55 and older. These adults should be screened once every three years. Adults who are younger than age 57, are overweight, and have one other risk factor. These adults should be screened once every three years. Adults who have normal blood glucose levels and two or more risk factors. These adults may be screened once every year (annually). Women who have had gestational diabetes in the past. These women should be screened once every three years. Pregnant women who have risk factors. These women should be screened at their first prenatal visit and again between weeks 24 and 28 of pregnancy. Children and adolescents Children and adolescents should be screened for type 2 diabetes if they are overweight and have any of the following risk factors: A family history of type 2 diabetes. Being a member of a high-risk ethnic group. Signs of insulin resistance or conditions that are associated with insulin resistance. A mother who had gestational diabetes while pregnant with him or her. Screening should be done at least once every three years, starting at age 40 or at the onset of puberty, whichever comes first. Your health care provider or your child's health care provider may recommendhaving a screening more or less often. What happens during screening? During screening, your health care provider may ask questions about: Your health and your risk factors, including your activity level and any medical conditions that you have. The health of your first-degree relatives. Past pregnancies, if this applies. Your health care provider will also do a physical exam, including a blood pressure measurement and blood tests. There are four blood tests that can be used to screen for type 2 diabetes. You may have one or more of the following: A fasting blood glucose (FBG) test. You will not be allowed to eat (you will  fast) for 8 hours or more before a blood sample is taken. A random blood glucose test. This test checks your blood glucose at any time of the day regardless of when you ate. An oral glucose tolerance test (OGTT). This test measures your blood glucose at two times: After you have not eaten (have fasted) overnight. This is your baseline glucose level. Two hours after you drink a glucose-containing beverage. An A1c (hemoglobin A1c) blood test. This test provides information about blood glucose control over the previous 2-3 months. What do the results mean? Your test  results are a measurement of how much glucose is in your blood. Normal blood glucose levels mean that you do not have diabetes or prediabetes. High blood glucose levels may mean that you have prediabetes or diabetes.Depending on the results, other tests may be needed to confirm the diagnosis. You may be diagnosed with type 2 diabetes if: Your FBG level is 126 mg/dL (7.0 mmol/L) or higher. Your random blood glucose level is 200 mg/dL (11.1 mmol/L) or higher. Your A1c level is 6.5% or higher. Your OGTT result is higher than 200 mg/dL (11.1 mmol/L). These blood tests may be repeated to confirm your diagnosis. Talk with yourhealth care provider about what your results mean. Summary A screening test for type 2 diabetes (type 2 diabetes mellitus) is a blood test to measure your blood sugar (glucose) level. Know what your risk factors are for developing type 2 diabetes. If you are at risk, get screening tests as often as told by your health care provider. Screening may help you identify type 2 diabetes at its early stage (prediabetes). Identifying and treating prediabetes may delay or prevent the development of type 2 diabetes. This information is not intended to replace advice given to you by your health care provider. Make sure you discuss any questions you have with your healthcare provider. Document Revised: 08/02/2020 Document Reviewed:  03/15/2020 Elsevier Patient Education  2022 [place] Prevention in the Home, Adult Falls can cause injuries and can happen to people of all ages. There are many things you can do to make your home safe and to help prevent falls. Ask forhelp when making these changes. What actions can I take to prevent falls? General Instructions Use good lighting in all rooms. Replace any light bulbs that burn out. Turn on the lights in dark areas. Use night-lights. Keep items that you use often in easy-to-reach places. Lower the shelves around your home if needed. Set up your furniture so you have a clear path. Avoid moving your furniture around. Do not have throw rugs or other things on the floor that can make you trip. Avoid walking on wet floors. If any of your floors are uneven, fix them. Add color or contrast paint or tape to clearly mark and help you see: Grab bars or handrails. First and last steps of staircases. Where the edge of each step is. If you use a stepladder: Make sure that it is fully opened. Do not climb a closed stepladder. Make sure the sides of the stepladder are locked in place. Ask someone to hold the stepladder while you use it. Know where your pets are when moving through your home. What can I do in the bathroom?     Keep the floor dry. Clean up any water on the floor right away. Remove soap buildup in the tub or shower. Use nonskid mats or decals on the floor of the tub or shower. Attach bath mats securely with double-sided, nonslip rug tape. If you need to sit down in the shower, use a plastic, nonslip stool. Install grab bars by the toilet and in the tub and shower. Do not use towel bars as grab bars. What can I do in the bedroom? Make sure that you have a light by your bed that is easy to reach. Do not use any sheets or blankets for your bed that hang to the floor. Have a firm chair with side arms that you can use for support when you get dressed. What  can I do  in the kitchen? Clean up any spills right away. If you need to reach something above you, use a step stool with a grab bar. Keep electrical cords out of the way. Do not use floor polish or wax that makes floors slippery. What can I do with my stairs? Do not leave any items on the stairs. Make sure that you have a light switch at the top and the bottom of the stairs. Make sure that there are handrails on both sides of the stairs. Fix handrails that are broken or loose. Install nonslip stair treads on all your stairs. Avoid having throw rugs at the top or bottom of the stairs. Choose a carpet that does not hide the edge of the steps on the stairs. Check carpeting to make sure that it is firmly attached to the stairs. Fix carpet that is loose or worn. What can I do on the outside of my home? Use bright outdoor lighting. Fix the edges of walkways and driveways and fix any cracks. Remove anything that might make you trip as you walk through a door, such as a raised step or threshold. Trim any bushes or trees on paths to your home. Check to see if handrails are loose or broken and that both sides of all steps have handrails. Install guardrails along the edges of any raised decks and porches. Clear paths of anything that can make you trip, such as tools or rocks. Have leaves, snow, or ice cleared regularly. Use sand or salt on paths during winter. Clean up any spills in your garage right away. This includes grease or oil spills. What other actions can I take? Wear shoes that: Have a low heel. Do not wear high heels. Have rubber bottoms. Feel good on your feet and fit well. Are closed at the toe. Do not wear open-toe sandals. Use tools that help you move around if needed. These include: Canes. Walkers. Scooters. Crutches. Review your medicines with your doctor. Some medicines can make you feel dizzy. This can increase your chance of falling. Ask your doctor what else you can do  to help prevent falls. Where to find more information Centers for Disease Control and Prevention, [employer]: http://www.wolf.info/ National Institute on Aging: http://kim-miller.com/ Contact a doctor if: You are afraid of falling at home. You feel weak, drowsy, or dizzy at home. You fall at home. Summary There are many simple things that you can do to make your home safe and to help prevent falls. Ways to make your home safe include removing things that can make you trip and installing grab bars in the bathroom. Ask for help when making these changes in your home. This information is not intended to replace advice given to you by your health care provider. Make sure you discuss any questions you have with your healthcare provider. Document Revised: 03/22/2020 Document Reviewed: 03/22/2020 Elsevier Patient Education  [place] Maintenance, Female Adopting a healthy lifestyle and getting preventive care are important in promoting health and wellness. Ask your health care provider about: The right schedule for you to have regular tests and exams. Things you can do on your own to prevent diseases and keep yourself healthy. What should I know about diet, weight, and exercise? Eat a healthy diet  Eat a diet that includes plenty of vegetables, fruits, low-fat dairy products, and lean protein. Do not eat a lot of foods that are high in solid fats, added sugars, or sodium.  Maintain a healthy weight Body mass  index (BMI) is used to identify weight problems. It estimates body fat based on height and weight. Your health care provider can help determineyour BMI and help you achieve or maintain a healthy weight. Get regular exercise Get regular exercise. This is one of the most important things you can do for your health. Most adults should: Exercise for at least 150 minutes each week. The exercise should increase your heart rate and make you sweat (moderate-intensity exercise). Do strengthening  exercises at least twice a week. This is in addition to the moderate-intensity exercise. Spend less time sitting. Even light physical activity can be beneficial. Watch cholesterol and blood lipids Have your blood tested for lipids and cholesterol at 69 years of age, then havethis test every 5 years. Have your cholesterol levels checked more often if: Your lipid or cholesterol levels are high. You are older than 69 years of age. You are at high risk for heart disease. What should I know about cancer screening? Depending on your health history and family history, you may need to have cancer screening at various ages. This may include screening for: Breast cancer. Cervical cancer. Colorectal cancer. Skin cancer. Lung cancer. What should I know about heart disease, diabetes, and high blood pressure? Blood pressure and heart disease High blood pressure causes heart disease and increases the risk of stroke. This is more likely to develop in people who have high blood pressure readings, are of African descent, or are overweight. Have your blood pressure checked: Every 3-5 years if you are 40-78 years of age. Every year if you are 42 years old or older. Diabetes Have regular diabetes screenings. This checks your fasting blood sugar level. Have the screening done: Once every three years after age 80 if you are at a normal weight and have a low risk for diabetes. More often and at a younger age if you are overweight or have a high risk for diabetes. What should I know about preventing infection? Hepatitis B If you have a higher risk for hepatitis B, you should be screened for this virus. Talk with your health care provider to find out if you are at risk forhepatitis B infection. Hepatitis C Testing is recommended for: Everyone born from [birth year] through 1964. Anyone with known risk factors for hepatitis C. Sexually transmitted infections (STIs) Get screened for STIs, including gonorrhea and  chlamydia, if: You are sexually active and are younger than 69 years of age. You are older than 69 years of age and your health care provider tells you that you are at risk for this type of infection. Your sexual activity has changed since you were last screened, and you are at increased risk for chlamydia or gonorrhea. Ask your health care provider if you are at risk. Ask your health care provider about whether you are at high risk for HIV. Your health care provider may recommend a prescription medicine to help prevent HIV infection. If you choose to take medicine to prevent HIV, you should first get tested for HIV. You should then be tested every 3 months for as long as you are taking the medicine. Pregnancy If you are about to stop having your period (premenopausal) and you may become pregnant, seek counseling before you get pregnant. Take 400 to 800 micrograms (mcg) of folic acid every day if you become pregnant. Ask for birth control (contraception) if you want to prevent pregnancy. Osteoporosis and menopause Osteoporosis is a disease in which the bones lose minerals and strength with aging.  This can result in bone fractures. If you are 39 years old or older, or if you are at risk for osteoporosis and fractures, ask your health care provider if you should: Be screened for bone loss. Take a calcium or vitamin D supplement to lower your risk of fractures. Be given hormone replacement therapy (HRT) to treat symptoms of menopause. Follow these instructions at home: Lifestyle Do not use any products that contain nicotine or tobacco, such as cigarettes, e-cigarettes, and chewing tobacco. If you need help quitting, ask your health care provider. Do not use street drugs. Do not share needles. Ask your health care provider for help if you need support or information about quitting drugs. Alcohol use Do not drink alcohol if: Your health care provider tells you not to drink. You are pregnant, may be  pregnant, or are planning to become pregnant. If you drink alcohol: Limit how much you use to 0-1 drink a day. Limit intake if you are breastfeeding. Be aware of how much alcohol is in your drink. In the [place] one drink equals one 12 oz bottle of beer (355 mL), one 5 oz glass of wine (148 mL), or one 1 oz glass of hard liquor (44 mL). General instructions Schedule regular health, dental, and eye exams. Stay current with your vaccines. Tell your health care provider if: You often feel depressed. You have ever been abused or do not feel safe at home. Summary Adopting a healthy lifestyle and getting preventive care are important in promoting health and wellness. Follow your health care provider's instructions about healthy diet, exercising, and getting tested or screened for diseases. Follow your health care provider's instructions on monitoring your cholesterol and blood pressure. This information is not intended to replace advice given to you by your health care provider. Make sure you discuss any questions you have with your healthcare provider. Document Revised: 08/12/2018 Document Reviewed: 08/12/2018 Elsevier Patient Education  2022 [employer].

## 2021-03-28 ENCOUNTER — Other Ambulatory Visit: Payer: Self-pay | Admitting: Allergy

## 2021-04-01 ENCOUNTER — Encounter: Payer: Self-pay | Admitting: Internal Medicine

## 2021-04-01 NOTE — Assessment & Plan Note (Signed)
Reports continuing benefit and regular use of CPAP. Plan- continue auto 5-15

## 2021-04-01 NOTE — Assessment & Plan Note (Signed)
Uncomplicated.  Plan- continue present meds

## 2021-04-02 ENCOUNTER — Other Ambulatory Visit: Payer: Self-pay | Admitting: *Deleted

## 2021-04-04 MED ORDER — AMMONIUM LACTATE 12 % EX LOTN: TOPICAL_LOTION | CUTANEOUS | 12 refills | Status: DC

## 2021-04-06 ENCOUNTER — Other Ambulatory Visit: Payer: Self-pay

## 2021-04-06 MED ORDER — FAMOTIDINE 40 MG PO TABS: 40.0000 mg | ORAL_TABLET | Freq: Every day | ORAL | 0 refills | Status: DC

## 2021-04-06 MED ORDER — BUDESONIDE-FORMOTEROL FUMARATE 160-4.5 MCG/ACT IN AERO: INHALATION_SPRAY | RESPIRATORY_TRACT | 0 refills | Status: DC

## 2021-04-12 ENCOUNTER — Other Ambulatory Visit: Payer: Self-pay

## 2021-04-12 ENCOUNTER — Telehealth (INDEPENDENT_AMBULATORY_CARE_PROVIDER_SITE_OTHER): Payer: Self-pay | Admitting: Psychiatry

## 2021-04-12 DIAGNOSIS — F330 Major depressive disorder, recurrent, mild: Secondary | ICD-10-CM

## 2021-04-12 DIAGNOSIS — F411 Generalized anxiety disorder: Secondary | ICD-10-CM

## 2021-04-12 MED ORDER — DULOXETINE HCL 60 MG PO CPEP: 60.0000 mg | ORAL_CAPSULE | Freq: Every day | ORAL | 0 refills | Status: DC

## 2021-04-12 MED ORDER — BUPROPION HCL 100 MG PO TABS: 100.0000 mg | ORAL_TABLET | Freq: Two times a day (BID) | ORAL | 0 refills | Status: DC

## 2021-04-12 NOTE — Progress Notes (Signed)
Virtual Visit via Telephone Note  I connected with [name] on 04/12/21 at  4:00 PM EDT by telephone and verified that I am speaking with the correct person using two identifiers.  Location: Patient: home Provider: office   I discussed the limitations, risks, security and privacy concerns of performing an evaluation and management service by telephone and the availability of in person appointments. I also discussed with the patient that there may be a patient responsible charge related to this service. The patient expressed understanding and agreed to proceed.   History of Present Illness: "I am hanging in there". She is sleeping ok. Some nights not as good as others. Marcheta is resting well is not feeling overly tired most days. Her anxiety is mild and is mostly situational. Rinki shares that her depression is very mild and only comes on with stress. She denies SI/HI. She was prescribed Cymbalta for her back pain by her PCP about 1 month ago.    Observations/Objective:  General Appearance: unable to assess  Eye Contact:  unable to assess  Speech:  Clear and Coherent and Normal Rate  Volume:  Normal  Mood:  Euthymic  Affect:  Full Range  Thought Process:  Goal Directed, Linear, and Descriptions of Associations: Intact  Orientation:  Full (Time, Place, and Person)  Thought Content:  Logical  Suicidal Thoughts:  No  Homicidal Thoughts:  No  Memory:  Immediate;   Good  Judgement:  Good  Insight:  Good  Psychomotor Activity: unable to assess  Concentration:  Concentration: Good  Recall:  Good  Fund of Knowledge:  Good  Language:  Good  Akathisia:  unable to assess  Handed:  unable to assess  AIMS (if indicated):     Assets:  Communication Skills Desire for Improvement Financial Resources/Insurance Housing Resilience Social Support Talents/Skills Transportation Vocational/Educational  ADL's:  unable to assess  Cognition:  WNL  Sleep:         Assessment and Plan: Depression screen [facility] 2/9 04/12/2021 03/23/2021 01/10/2021 11/02/2020 01/20/2020  Decreased Interest 0 1 0 0 0  Down, Depressed, Hopeless 0 0 0 0 1  PHQ - 2 Score 0 1 0 0 1  Altered sleeping - - 1 - -  Tired, decreased energy - - 1 - -  Change in appetite - - 2 - -  Feeling bad or failure about yourself  - - 0 - -  Trouble concentrating - - 0 - -  Moving slowly or fidgety/restless - - 0 - -  Suicidal thoughts - - 0 - -  PHQ-9 Score - - 4 - -  Difficult doing work/chores - - Not difficult at all - -  Some recent data might be hidden   Flowsheet Row Video Visit from 04/12/2021 in [facility] Video Visit from 11/02/2020 in [place] No Risk No Risk      D/c Zoloft since she is taking Cymbalta  1. GAD (generalized anxiety disorder) - DULoxetine (CYMBALTA) 60 MG capsule; Take 1 capsule (60 mg total) by mouth daily.  Dispense: 90 capsule; Refill: 0 - buPROPion (WELLBUTRIN) 100 MG tablet; Take 1 tablet (100 mg total) by mouth 2 (two) times daily.  Dispense: 180 tablet; Refill: 0  2. Mild episode of recurrent major depressive disorder (HCC) - DULoxetine (CYMBALTA) 60 MG capsule; Take 1 capsule (60 mg total) by mouth daily.  Dispense: 90 capsule; Refill: 0 - buPROPion (WELLBUTRIN) 100 MG tablet; Take  1 tablet (100 mg total) by mouth 2 (two) times daily.  Dispense: 180 tablet; Refill: 0   Follow Up Instructions: In 2-3 months or sooner if needed   I discussed the assessment and treatment plan with the patient. The patient was provided an opportunity to ask questions and all were answered. The patient agreed with the plan and demonstrated an understanding of the instructions.   The patient was advised to call back or seek an in-person evaluation if the symptoms worsen or if the condition fails to improve as anticipated.  I provided 10 minutes of non-face-to-face time  during this encounter.   Charlcie Cradle, MD

## 2021-04-18 ENCOUNTER — Encounter: Payer: Self-pay | Admitting: Family Medicine

## 2021-04-18 ENCOUNTER — Ambulatory Visit (INDEPENDENT_AMBULATORY_CARE_PROVIDER_SITE_OTHER): Payer: Medicare Other | Admitting: Family Medicine

## 2021-04-18 ENCOUNTER — Other Ambulatory Visit: Payer: Self-pay

## 2021-04-18 VITALS — BP 130/78 | HR 81 | Ht 61.0 in | Wt 183.8 lb

## 2021-04-18 DIAGNOSIS — R21 Rash and other nonspecific skin eruption: Secondary | ICD-10-CM

## 2021-04-18 DIAGNOSIS — H903 Sensorineural hearing loss, bilateral: Secondary | ICD-10-CM

## 2021-04-18 DIAGNOSIS — F3340 Major depressive disorder, recurrent, in remission, unspecified: Secondary | ICD-10-CM

## 2021-04-18 DIAGNOSIS — E1149 Type 2 diabetes mellitus with other diabetic neurological complication: Secondary | ICD-10-CM | POA: Diagnosis not present

## 2021-04-18 LAB — POCT GLYCOSYLATED HEMOGLOBIN (HGB A1C): HbA1c, POC (controlled diabetic range): 6.6 % (ref 0.0–7.0)

## 2021-04-18 NOTE — Patient Instructions (Signed)
Let me see you in December

## 2021-04-18 NOTE — Assessment & Plan Note (Signed)
Has had recent follow-up with her behavioral health provider.  She feels stable on her current medications.

## 2021-04-18 NOTE — Assessment & Plan Note (Signed)
Cannot tell if she is complaining of increased symptoms of decreased hearing or if there is now some associated tinnitus.  She has not seen audiologist in quite a while so I will set that referral up.  She is instructed to take her current hearing aids with her even if she is not using them regularly.

## 2021-04-18 NOTE — Progress Notes (Signed)
CHIEF COMPLAINT / HPI:   #1.  Follow-up diabetes mellitus: She continues to take her current dose of insulin on her own schedule.  If she does not eat much in the evening, then she decreases her dose.  This seems to work well from her perspective.  She has had no episodes of low blood sugar, no increased frequency of urination, no unusual thirst, no weight loss #2.  Long history of decreased hearing but recently she has had more of a roaring sensation in her ears.  She had some hearing aids a few years ago but they never seem to fit well and she has not been using them. #3.  Follow-up skin rash: She saw the dermatologist and they treated her with some topical cream.  She was also able to finally eradicate bedbugs from her apartment.  She has some questions about the lab work that the dermatologist did.  She is under the impression they told her she had a urinary tract infection.  She is also not sure what the diagnosis was. #4.  Mood has been stable.  Says things are going pretty well for her right now.  PERTINENT  PMH / PSH: I have reviewed the patient's medications, allergies, past medical and surgical history, smoking status and updated in the EMR as appropriate.   OBJECTIVE:  BP 130/78   Pulse 81   Ht '5\' 1"'$  (1.549 m)   Wt 183 lb 12.8 oz (83.4 kg)   SpO2 99%   BMI 34.73 kg/m  GENERAL: Well-developed female no acute distress next NEURO: Legally blind with resting nystagmus horizontal. MSK: She rises from a chair using some assistance from her rolling walker and ambulates with a rolling walker.  Needs some guidance for decreased vision. SKIN: No rashes noted.  She has a healing biopsy site on her left volar surface of the forearm that shows no sign of infection. PSYCH: AxOx4. Good eye contact.. No psychomotor retardation or agitation. Appropriate speech fluency and content. Asks and answers questions appropriately. Mood is congruent.   ASSESSMENT / PLAN:   Diabetes mellitus type 2  with neurological manifestations (HCC) A1c of 6.6 today.  This is excellent control.  I contemplated adjusting her insulin dosage down somewhat but after discussion with her, I realize she is doing her own sort of sliding scale.  Seems like she is being pretty successful so I am not going to change it at all at this for office visit.  We will recheck A1c in 3 months.  NEURAL HEARING LOSS BILATERAL Cannot tell if she is complaining of increased symptoms of decreased hearing or if there is now some associated tinnitus.  She has not seen audiologist in quite a while so I will set that referral up.  She is instructed to take her current hearing aids with her even if she is not using them regularly.  Major depression, recurrent ([place]) Has had recent follow-up with her behavioral health provider.  She feels stable on her current medications.  Rash and nonspecific skin eruption I reviewed all the notes and labs from [facility] dermatology office.  There was nothing that indicated she had urinary tract infection.  There was some increased protein seen in her urine which I feel certain is related to her diabetes.  She is reassured about the lab work.  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  Sounds like the issue has sort of resolved itself with the eradication  of the bedbugs and with their treatment effluence in [facility].  She does not feel like she needs to have a follow-up appointment with them.   [name] MD

## 2021-04-18 NOTE — Assessment & Plan Note (Signed)
A1c of 6.6 today.  This is excellent control.  I contemplated adjusting her insulin dosage down somewhat but after discussion with her, I realize she is doing her own sort of sliding scale.  Seems like she is being pretty successful so I am not going to change it at all at this for office visit.  We will recheck A1c in 3 months.

## 2021-04-18 NOTE — Progress Notes (Signed)
a1c

## 2021-04-18 NOTE — Assessment & Plan Note (Signed)
I reviewed all the notes and labs from [facility] dermatology office.  There was nothing that indicated she had urinary tract infection.  There was some increased protein seen in her urine which I feel certain is related to her diabetes.  She is reassured about the lab work.  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  Sounds like the issue has sort of resolved itself with the eradication of the bedbugs and with their treatment effluence in [facility].  She does not feel like she needs to have a follow-up appointment with them.

## 2021-04-23 ENCOUNTER — Telehealth: Payer: Self-pay | Admitting: *Deleted

## 2021-04-23 NOTE — Telephone Encounter (Signed)
Refill request from [facility] for setraline HCL tab, did not see on patients current med list.  Please send to pharmacy if appropriate.Keyleen Cerrato Zimmerman Rumple, CMA

## 2021-04-26 NOTE — Telephone Encounter (Signed)
I looked back at med list.  It looks like her psychiatrist discontinued this on August 11.  Can you call [facility] and make sure that that is the case.  I would prefer her psychiatrist take care of all these medicines if possible so we do not get her on something inappropriate.  Let me know if there is an issue.  Will deny this refill currently.

## 2021-04-27 NOTE — Telephone Encounter (Signed)
Contacted pt and she does NOT need this filled.  She also wanted to let you know that she has not heard from audiology yet, I checked the referral and it has been placed in their [place] and they should contact her for an appointment. I offered to give her the number to call and she said she would just wait to hear from them. Told her if she changed her mind she could call us back.  The office is [facility] outpatient audiology, the number is [phone] if she call back. [name] [name], CMA

## 2021-05-03 ENCOUNTER — Other Ambulatory Visit: Payer: Self-pay | Admitting: Allergy

## 2021-05-13 ENCOUNTER — Other Ambulatory Visit: Payer: Self-pay | Admitting: Family Medicine

## 2021-05-13 DIAGNOSIS — E1149 Type 2 diabetes mellitus with other diabetic neurological complication: Secondary | ICD-10-CM

## 2021-05-15 NOTE — Telephone Encounter (Signed)
Dear [name] Team Can you call [name] [name] and see EXACTLY how she is taking her sliding scale insulin (the short acting one---the lispro) I received a refill request and it ways BOTH her long acting and short acting are 35 units twice a day. That doesnot sound right. I DO know she is making up her own sliding scale and that is fine---her A1C was great---but I need to know exactly what she IS taling/ \. THANKS! , [name]

## 2021-05-16 ENCOUNTER — Other Ambulatory Visit: Payer: Self-pay

## 2021-05-16 ENCOUNTER — Ambulatory Visit: Payer: Medicare Other | Attending: Audiologist | Admitting: Audiologist

## 2021-05-16 DIAGNOSIS — H903 Sensorineural hearing loss, bilateral: Secondary | ICD-10-CM | POA: Diagnosis not present

## 2021-05-16 NOTE — Procedures (Signed)
  Outpatient Audiology and [place], [place]  [id number] [phone]  AUDIOLOGICAL  EVALUATION  NAME: [name] [name]     DOB:   November 04, 1950      MRN: [id number]                                                                                     DATE: 05/16/2021     REFERENT: Dickie La, MD STATUS: Outpatient DIAGNOSIS: Sensorineural Hearing Loss Bilateral     History: [name] was seen for an audiological evaluation. [name] was accompanied to the appointment by a friend who is her ride who waited in the lobby.  [name] is receiving a hearing evaluation due to concerns for difficulty hearing. It appears however it is just that the right hearing aid is dead and not charging. The right port on the charger for the aid is broken. This was explained to [name].  [name], [name] No further case history discussed due to limited time. [name] said she wanted her hearing tested anyway here, and she will take a copy of the results were she got the hearing aids.  Medical history positive for type II diabetes with neuropathy which is a risk factor for hearing loss. No other relevant case history reported.   Evaluation:  Otoscopy showed a clear view of the tympanic membranes, bilaterally Tympanometry results were consistent with shallow but normal middle ear function, bilaterally   Audiometric testing was completed using conventional audiometry with insert transducer. Speech Recognition Thresholds were fairly consistent with pure tone averages. Word Recognition was good at an elevated level. Pure tone thresholds show moderate to profound sensorineural hearing loss both ears. Test results are consistent with presbycusis.   Results:   The test results were reviewed with [name]. She wanted her phone to work with the hearing aids. Right aid is not working due to the port in the charger being broken. I connected the left hearing aid to the phone and showed her how to open the app and change volume. I called [facility] and they can see her today to work on fixing the charger or getting a replacement. I gave her ride the address to [facility] and they wen straight from this office.    Recommendations: 1.   Patient set up with appointment today with [facility]. She was provided with a copy of her audiogram to take as well.    Alfonse Alpers  Audiologist, Au.D., CCC-A 05/16/2021  11:41 AM  Cc: Dickie La, MD

## 2021-05-23 ENCOUNTER — Telehealth: Payer: Self-pay

## 2021-05-30 ENCOUNTER — Ambulatory Visit (INDEPENDENT_AMBULATORY_CARE_PROVIDER_SITE_OTHER): Payer: Medicare Other

## 2021-05-30 ENCOUNTER — Other Ambulatory Visit: Payer: Self-pay

## 2021-05-30 DIAGNOSIS — Z23 Encounter for immunization: Secondary | ICD-10-CM

## 2021-05-30 NOTE — Progress Notes (Signed)
Patient presents to nurse clinic for flu vaccination. Administered in LD, site unremarkable, tolerated injection well.   Larrie Lucia C Jamy Whyte, RN   

## 2021-06-04 NOTE — Telephone Encounter (Signed)
error 

## 2021-06-07 ENCOUNTER — Other Ambulatory Visit: Payer: Self-pay | Admitting: Allergy

## 2021-06-08 ENCOUNTER — Ambulatory Visit: Payer: Medicare Other | Admitting: Podiatry

## 2021-06-08 ENCOUNTER — Telehealth: Payer: Self-pay

## 2021-06-08 NOTE — Telephone Encounter (Signed)
Patient calls nurse line to check status of SCAT paperwork she provided to Dr. Nori Riis at visit last week.   Please advise.   Talbot Grumbling, RN

## 2021-06-25 DIAGNOSIS — H401133 Primary open-angle glaucoma, bilateral, severe stage: Secondary | ICD-10-CM | POA: Diagnosis not present

## 2021-07-05 ENCOUNTER — Ambulatory Visit (HOSPITAL_BASED_OUTPATIENT_CLINIC_OR_DEPARTMENT_OTHER): Payer: Medicare Other | Admitting: Psychiatry

## 2021-07-05 ENCOUNTER — Other Ambulatory Visit: Payer: Self-pay

## 2021-07-05 DIAGNOSIS — F330 Major depressive disorder, recurrent, mild: Secondary | ICD-10-CM

## 2021-07-05 DIAGNOSIS — F411 Generalized anxiety disorder: Secondary | ICD-10-CM

## 2021-07-05 MED ORDER — BUPROPION HCL 100 MG PO TABS: 100.0000 mg | ORAL_TABLET | Freq: Two times a day (BID) | ORAL | 0 refills | Status: DC

## 2021-07-05 MED ORDER — DULOXETINE HCL 60 MG PO CPEP: 60.0000 mg | ORAL_CAPSULE | Freq: Every day | ORAL | 0 refills | Status: DC

## 2021-07-05 NOTE — Progress Notes (Signed)
Virtual Visit via Telephone Note  I connected with [name] [name] on 07/05/21 at  4:30 PM EDT by telephone and verified that I am speaking with the correct person using two identifiers. We were unable to connect by video.   Location: Patient: home Provider: office   I discussed the limitations, risks, security and privacy concerns of performing an evaluation and management service by telephone and the availability of in person appointments. I also discussed with the patient that there may be a patient responsible charge related to this service. The patient expressed understanding and agreed to proceed.   History of Present Illness: "I am doing good. I am behaving. Everything seems to be going right along". Her mood is stable. She denies depression and anxiety. She denies isolation and anhedonia. Sleep and appetite are good. She denies SI/HI.    Observations/Objective:  General Appearance: unable to assess  Eye Contact:  unable to assess  Speech:  Clear and Coherent and Normal Rate  Volume:  Normal  Mood:  Euthymic  Affect:  Full Range  Thought Process:  Goal Directed, Linear, and Descriptions of Associations: Intact  Orientation:  Full (Time, Place, and Person)  Thought Content:  Logical  Suicidal Thoughts:  No  Homicidal Thoughts:  No  Memory:  Immediate;   Good  Judgement:  Good  Insight:  Good  Psychomotor Activity: unable to assess  Concentration:  Concentration: Good  Recall:  Good  Fund of Knowledge:  Good  Language:  Good  Akathisia:  unable to assess  Handed:  unable to assess  AIMS (if indicated):     Assets:  Communication Skills Desire for Improvement Financial Resources/Insurance Housing Leisure Time Resilience Social Support Talents/Skills Transportation Vocational/Educational  ADL's:  unable to assess  Cognition:  WNL  Sleep:        Assessment and Plan: Depression screen [facility] 2/9 07/05/2021 04/18/2021 04/12/2021 03/23/2021 01/10/2021  Decreased  Interest 0 0 0 1 0  Down, Depressed, Hopeless 0 0 0 0 0  PHQ - 2 Score 0 0 0 1 0  Altered sleeping 0 2 - - 1  Tired, decreased energy 0 1 - - 1  Change in appetite 0 1 - - 2  Feeling bad or failure about yourself  0 0 - - 0  Trouble concentrating 0 0 - - 0  Moving slowly or fidgety/restless 0 0 - - 0  Suicidal thoughts 0 0 - - 0  PHQ-9 Score 0 4 - - 4  Difficult doing work/chores Not difficult at all - - - Not difficult at all  Some recent data might be hidden    [place] Office Visit from 07/05/2021 in [facility] Video Visit from 04/12/2021 in [facility] Video Visit from 11/02/2020 in [facility]  C-SSRS RISK CATEGORY No Risk No Risk No Risk       1. GAD (generalized anxiety disorder) - buPROPion (WELLBUTRIN) 100 MG tablet; Take 1 tablet (100 mg total) by mouth 2 (two) times daily.  Dispense: 180 tablet; Refill: 0 - DULoxetine (CYMBALTA) 60 MG capsule; Take 1 capsule (60 mg total) by mouth daily.  Dispense: 90 capsule; Refill: 0  2. Mild episode of recurrent major depressive disorder (HCC) - buPROPion (WELLBUTRIN) 100 MG tablet; Take 1 tablet (100 mg total) by mouth 2 (two) times daily.  Dispense: 180 tablet; Refill: 0 - DULoxetine (CYMBALTA) 60 MG capsule; Take 1 capsule (60 mg total) by mouth daily.  Dispense: 90 capsule; Refill: 0   Follow Up Instructions: In 3-4 months or sooner if needed   I discussed the assessment and treatment plan with the patient. The patient was provided an opportunity to ask questions and all were answered. The patient agreed with the plan and demonstrated an understanding of the instructions.   The patient was advised to call back or seek an in-person evaluation if the symptoms worsen or if the condition fails to improve as anticipated.  I provided 9 minutes of non-face-to-face time during this encounter.   Charlcie Cradle,  MD

## 2021-07-11 ENCOUNTER — Other Ambulatory Visit: Payer: Self-pay | Admitting: Family Medicine

## 2021-07-13 ENCOUNTER — Ambulatory Visit: Payer: Medicare Other | Admitting: Internal Medicine

## 2021-07-18 ENCOUNTER — Ambulatory Visit
Admission: RE | Admit: 2021-07-18 | Discharge: 2021-07-18 | Disposition: A | Discharge status: Home / Self Care | Payer: Medicare Other | Source: Ambulatory Visit | Attending: Family Medicine | Admitting: Family Medicine

## 2021-07-18 ENCOUNTER — Other Ambulatory Visit: Payer: Self-pay | Admitting: Family Medicine

## 2021-07-18 DIAGNOSIS — R928 Other abnormal and inconclusive findings on diagnostic imaging of breast: Secondary | ICD-10-CM

## 2021-07-20 ENCOUNTER — Other Ambulatory Visit: Payer: Self-pay | Admitting: *Deleted

## 2021-07-20 MED ORDER — ALBUTEROL SULFATE HFA 108 (90 BASE) MCG/ACT IN AERS: 2.0000 | INHALATION_SPRAY | Freq: Four times a day (QID) | RESPIRATORY_TRACT | 1 refills | Status: DC | PRN

## 2021-07-24 ENCOUNTER — Other Ambulatory Visit: Payer: Self-pay | Admitting: *Deleted

## 2021-07-24 MED ORDER — ALBUTEROL SULFATE HFA 108 (90 BASE) MCG/ACT IN AERS: 2.0000 | INHALATION_SPRAY | Freq: Four times a day (QID) | RESPIRATORY_TRACT | 1 refills | Status: DC | PRN

## 2021-07-31 ENCOUNTER — Telehealth: Payer: Self-pay

## 2021-07-31 NOTE — Telephone Encounter (Signed)
Received fax from pharmacy asking for a refill of the following medication not found on current med list. Please send Rx if appropriate   Sertraline HCl Tab  Ottis Stain, CMA

## 2021-08-01 ENCOUNTER — Ambulatory Visit (INDEPENDENT_AMBULATORY_CARE_PROVIDER_SITE_OTHER): Payer: Medicare Other | Admitting: Family Medicine

## 2021-08-01 ENCOUNTER — Encounter: Payer: Self-pay | Admitting: Family Medicine

## 2021-08-01 ENCOUNTER — Other Ambulatory Visit: Payer: Self-pay

## 2021-08-01 ENCOUNTER — Ambulatory Visit (INDEPENDENT_AMBULATORY_CARE_PROVIDER_SITE_OTHER): Payer: Medicare Other

## 2021-08-01 VITALS — BP 182/78 | HR 70 | Ht 61.0 in | Wt 181.0 lb

## 2021-08-01 DIAGNOSIS — Z23 Encounter for immunization: Secondary | ICD-10-CM | POA: Diagnosis not present

## 2021-08-01 DIAGNOSIS — E1149 Type 2 diabetes mellitus with other diabetic neurological complication: Secondary | ICD-10-CM | POA: Diagnosis not present

## 2021-08-01 DIAGNOSIS — I10 Essential (primary) hypertension: Secondary | ICD-10-CM

## 2021-08-01 LAB — POCT GLYCOSYLATED HEMOGLOBIN (HGB A1C): HbA1c, POC (controlled diabetic range): 6.5 % (ref 0.0–7.0)

## 2021-08-01 NOTE — Assessment & Plan Note (Signed)
A1c today shows excellent control.  We will continue current medication regimen without change.  Follow-up 3 to 4 months for repeat A1c.

## 2021-08-01 NOTE — Assessment & Plan Note (Signed)
Initial blood pressure reading was elevated today but she reports taking her blood pressure medicine late.  Recheck was much improved.  We will continue current medication regimen.  Check CMP today.  Follow-up 3 to 4 months.

## 2021-08-01 NOTE — Patient Instructions (Signed)
I will send you a note about your blood work.  I will plan to see you back in March.  If anything comes up in the meantime, please let me know.  Have a happy holiday!

## 2021-08-01 NOTE — Progress Notes (Signed)
    CHIEF COMPLAINT / HPI: #1.  Follow-up diabetes mellitus: Taking medicines regularly with out any problem.  No episodes of low blood sugar. 2.  Hypertension: Taking medicines without issue.  Does not need refills.  No chest pain, no shortness of breath with exertion.  No lower extremity edema.   PERTINENT  PMH / PSH: I have reviewed the patient's medications, allergies, past medical and surgical history, smoking status and updated in the EMR as appropriate.   OBJECTIVE:  BP (!) 182/78   Pulse 70   Ht 5\' 1"  (1.549 m)   Wt 181 lb (82.1 kg)   SpO2 98%   BMI 34.20 kg/m  GENERAL: Well-developed female no acute distress HEENT: Resting nystagmus and blindness.  Decreased hearing bilaterally. NECK: Supple.  No lymphadenopathy, no JVD. CV: Regular rate and rhythm LUNGS: Clear to auscultation bilaterally  ASSESSMENT / PLAN:   Diabetes mellitus type 2 with neurological manifestations (HCC) A1c today shows excellent control.  We will continue current medication regimen without change.  Follow-up 3 to 4 months for repeat A1c.  HYPERTENSION, BENIGN SYSTEMIC Initial blood pressure reading was elevated today but she reports taking her blood pressure medicine late.  Recheck was much improved.  We will continue current medication regimen.  Check CMP today.  Follow-up 3 to 4 months.   [name] MD

## 2021-08-02 LAB — BASIC METABOLIC PANEL
BUN/Creatinine Ratio: 13 (ref 12–28)
BUN: 9 mg/dL (ref 8–27)
CO2: 28 mmol/L (ref 20–29)
Calcium: 9.1 mg/dL (ref 8.7–10.3)
Chloride: 104 mmol/L (ref 96–106)
Creatinine, Ser: 0.72 mg/dL (ref 0.57–1.00)
Glucose: 170 mg/dL — ABNORMAL HIGH (ref 70–99)
Potassium: 4.1 mmol/L (ref 3.5–5.2)
Sodium: 145 mmol/L — ABNORMAL HIGH (ref 134–144)
eGFR: 90 mL/min/{1.73_m2} (ref >59)

## 2021-08-07 ENCOUNTER — Encounter: Payer: Self-pay | Admitting: Family Medicine

## 2021-08-08 ENCOUNTER — Ambulatory Visit: Payer: Medicare Other | Admitting: Allergy

## 2021-08-08 ENCOUNTER — Other Ambulatory Visit: Payer: Self-pay | Admitting: Allergy

## 2021-09-04 NOTE — Progress Notes (Signed)
Follow Up Note  RE: [name] [name] MRN: [id number] DOB: 10-14-1950 Date of Office Visit: 09/05/2021  Referring provider: Dickie La, MD Primary care provider: Dickie La, MD  Chief Complaint: Asthma  History of Present Illness: I had the pleasure of seeing [name] [name] for a follow up visit at the [facility] on 09/05/2021. She is a 71 y.o. female, who is being followed for asthma, allergic rhinitis, LPRD and bee sting allergy. Her previous allergy office visit was on 01/22/2021 with Dr. Maudie Mercury. Today is a regular follow up visit.  Moderate persistent asthma Currently on Symbicort 13mcg 2 puffs twice a day and montelukast 10mg  daily.  Only using albuterol once every 1-2 weeks.   Few episodes of deep coughing lasting for a few minutes at a time.   Denies any SOB, wheezing, chest tightness, nocturnal awakenings, ER/urgent care visits or prednisone use since the last visit.   Seasonal and perennial allergic rhinitis Currently taking Singulair daily and Xyzal daily with good benefit. Using Flonase as needed. No nosebleeds.   LPRD  Stable with Nexium and famotidine daily.    Bee sting allergy No stings since last visit.   Has issues with her hearing aids not fitting and is going to have that looked at.   Assessment and Plan: [name] [name] is a 71 y.o. female with: Moderate persistent asthma without complication Well-controlled and no prednisone use.  Today's spirometry was unremarkable. Daily controller medication(s): continue Symbicort 12mcg 2 puffs twice a day with spacer and rinse mouth afterwards. Continue Singulair 10mg  daily  May use albuterol rescue inhaler 2 puffs every 4 to 6 hours as needed for shortness of breath, chest tightness, coughing, and wheezing. May use albuterol rescue inhaler 2 puffs 5 to 15 minutes prior to strenuous physical activities. Monitor frequency of use.  Get spirometry at next visit.  Seasonal and perennial allergic  rhinitis Past history - 2018 skin testing was positive to dust mites and tree pollen. Interim history - controlled. Continue environmental control measures. Use Flonase (fluticasone) nasal spray 1 spray per nostril twice a day as needed for nasal congestion.  Nasal saline spray (i.e., Simply Saline) or nasal saline lavage (i.e., NeilMed) is recommended as needed and prior to medicated nasal sprays. May use over the counter antihistamines such as Zyrtec (cetirizine), Claritin (loratadine), Allegra (fexofenadine), or Xyzal (levocetirizine) daily as needed.  LPRD (laryngopharyngeal reflux disease) Stable.  Continue Nexium 40mg  daily. Continue pepcid 40mg  daily.    Bee sting allergy No stings since last visit.  For mild symptoms you can take over the counter antihistamines such as Benadryl and monitor symptoms closely. If symptoms worsen or if you have severe symptoms including breathing issues, throat closure, significant swelling, whole body hives, severe diarrhea and vomiting, lightheadedness then inject epinephrine and seek immediate medical care afterwards. Consider testing in future.  Return in about 6 months (around 03/05/2022).  Meds ordered this encounter  Medications   famotidine (PEPCID) 40 MG tablet    Sig: Take 1 tablet (40 mg total) by mouth daily.    Dispense:  90 tablet    Refill:  2   montelukast (SINGULAIR) 10 MG tablet    Sig: Take 1 tablet (10 mg total) by mouth daily.    Dispense:  90 tablet    Refill:  2   albuterol (VENTOLIN HFA) 108 (90 Base) MCG/ACT inhaler    Sig: Inhale 2 puffs into the lungs every 4 (four) hours as needed for wheezing  or shortness of breath (coughing fits).    Dispense:  18 g    Refill:  1   budesonide-formoterol (SYMBICORT) 160-4.5 MCG/ACT inhaler    Sig: Inhale 2 puffs into the lungs in the morning and at bedtime. with spacer and rinse mouth afterwards.    Dispense:  30.6 g    Refill:  2   Lab Orders  No laboratory test(s) ordered today    Diagnostics: Spirometry:  Tracings reviewed. Her effort: It was hard to get consistent efforts and there is a question as to whether this reflects a maximal maneuver. FVC: 1.57L FEV1: 1.30L, 75% predicted FEV1/FVC ratio: 83% Interpretation: No overt abnormalities noted given today's efforts.  Please see scanned spirometry results for details.  Medication List:  Current Outpatient Medications  Medication Sig Dispense Refill   ACCU-CHEK FASTCLIX LANCETS MISC TEST THREE TIMES DAILY 306 each 12   acyclovir (ZOVIRAX) 200 MG capsule TAKE 1 CAPSULE BY MOUTH TWO TIMES DAILY 180 capsule 3   albuterol (VENTOLIN HFA) 108 (90 Base) MCG/ACT inhaler Inhale 2 puffs into the lungs every 4 (four) hours as needed for wheezing or shortness of breath (coughing fits). 18 g 1   Alcohol Swabs (B-D SINGLE USE SWABS REGULAR) PADS USE THREE TIMES DAILY 300 each 3   allopurinol (ZYLOPRIM) 300 MG tablet TAKE 1 TABLET BY MOUTH  DAILY 90 tablet 3   ammonium lactate (LAC-HYDRIN) 12 % lotion APPLY 1 APPLICATION TOPICALLY TWICE DAILY AS NEEDED FOR DRY SKIN (SUBSTITUTED FOR LAC HYDRIN) 400 g 12   aspirin EC 81 MG tablet Take 81 mg by mouth daily.     atorvastatin (LIPITOR) 40 MG tablet TAKE 1 TABLET BY MOUTH  DAILY 90 tablet 3   B-D ULTRAFINE III SHORT PEN 31G X 8 MM MISC USE TO INJECT THREE TIMES A DAY 90 each 3   budesonide-formoterol (SYMBICORT) 160-4.5 MCG/ACT inhaler Inhale 2 puffs into the lungs in the morning and at bedtime. with spacer and rinse mouth afterwards. 30.6 g 2   buPROPion (WELLBUTRIN) 100 MG tablet Take 1 tablet (100 mg total) by mouth 2 (two) times daily. 180 tablet 0   COMBIGAN 0.2-0.5 % ophthalmic solution INSTILL 1 DROP INTO THE  LEFT EYE EVERY 12 HOURS 5 mL 12   DULoxetine (CYMBALTA) 60 MG capsule Take 1 capsule (60 mg total) by mouth daily. 90 capsule 0   esomeprazole (NEXIUM) 40 MG capsule TAKE 1 CAPSULE BY MOUTH  DAILY 90 capsule 3   ezetimibe (ZETIA) 10 MG tablet TAKE 1 TABLET BY MOUTH   DAILY 90 tablet 3   fluticasone (FLONASE) 50 MCG/ACT nasal spray Place 1 spray into both nostrils in the morning and at bedtime. 48 g 2   furosemide (LASIX) 40 MG tablet Take one tablet by mouth daily 90 tablet 3   glucose blood (PRODIGY NO CODING BLOOD GLUC) test strip USE AS INSTRUCTED TO CHECK BLOOD SUGAR THREE TIMES A DAY 300 each 12   HUMALOG KWIKPEN 100 UNIT/ML KwikPen INJECT SUBCUTANEOUSLY 35  UNITS TWICE DAILY AFTER  MEALS AS DIRECTED 75 mL 3   Insulin Syringe-Needle U-100 (BD INSULIN SYRINGE ULTRAFINE) 31G X 15/64" 0.5 ML MISC Use to inject three times a day 300 each 2   LANTUS SOLOSTAR 100 UNIT/ML Solostar Pen INJECT SUBCUTANEOUSLY 35  UNITS TWICE DAILY 75 mL 3   latanoprost (XALATAN) 0.005 % ophthalmic solution Place 1 drop into both eyes at bedtime. 2.5 mL 3   LINZESS 290 MCG CAPS capsule Take 290 mcg by  mouth daily as needed.      losartan (COZAAR) 50 MG tablet Take 2 tablets (100 mg total) by mouth daily. 90 tablet 3   metoCLOPramide (REGLAN) 5 MG tablet TAKE 1 TABLET BY MOUTH 4  TIMES DAILY 360 tablet 3   metoprolol succinate (TOPROL-XL) 50 MG 24 hr tablet TAKE 3 TABLETS BY MOUTH  DAILY WITH OR IMMEDIATELY  FOLLOWING A MEAL 270 tablet 3   Multiple Vitamins-Minerals (MULTIVITAMIN PO) Take 1 tablet by mouth daily.     RESTASIS MULTIDOSE 0.05 % ophthalmic emulsion Place 1 drop into both eyes daily as needed.  3   spironolactone (ALDACTONE) 25 MG tablet TAKE 1 TABLET BY MOUTH  DAILY BEFORE SUPPER 90 tablet 3   TRULICITY 1.5 QP/6.1PJ SOPN INJECT THE CONTENTS OF ONE  PEN SUBCUTANEOUSLY WEEKLY 6 mL 3   famotidine (PEPCID) 40 MG tablet Take 1 tablet (40 mg total) by mouth daily. 90 tablet 2   montelukast (SINGULAIR) 10 MG tablet Take 1 tablet (10 mg total) by mouth daily. 90 tablet 2   No current facility-administered medications for this visit.   Allergies: Allergies  Allergen Reactions   Bee Venom Hives and Swelling   Propoxyphene Hcl Itching   Amlodipine Besylate Swelling    Hydrocodone Other (See Comments)    With Vicodin - makes patient "jittery", and "hangover effect - sleepy next day"   Lisinopril Cough    Changed to ARB   Metformin And Related Diarrhea    GI distress   Metronidazole Other (See Comments)    REACTION: "just didn't work; my body never did heal from it"   Valsartan Other (See Comments)    REACTION:  "sleep more the next day after I took it; it made me real tired"   Propoxyphene     Other reaction(s): Unknown   Sulfasalazine     Other reaction(s): Unknown   Xyzal [Levocetirizine Dihydrochloride] Rash   I reviewed her past medical history, social history, family history, and environmental history and no significant changes have been reported from her previous visit.  Review of Systems  Constitutional:  Negative for appetite change, chills, fever and unexpected weight change.  HENT:  Negative for congestion and rhinorrhea.   Eyes:  Negative for itching.  Respiratory:  Negative for cough, chest tightness, shortness of breath and wheezing.   Gastrointestinal:  Negative for abdominal pain.  Skin:  Negative for rash.  Allergic/Immunologic: Positive for environmental allergies. Negative for food allergies.  Neurological:  Negative for headaches.   Objective: BP 130/78    Pulse 72    Temp 98.2 F (36.8 C) (Temporal)    Resp 16    SpO2 99%  There is no height or weight on file to calculate BMI. Physical Exam Vitals and nursing note reviewed.  Constitutional:      Appearance: She is well-developed.  HENT:     Head: Normocephalic and atraumatic.     Right Ear: External ear normal.     Left Ear: External ear normal.     Nose: Nose normal.  Eyes:     Conjunctiva/sclera: Conjunctivae normal.  Cardiovascular:     Rate and Rhythm: Normal rate and regular rhythm.     Heart sounds: Normal heart sounds. No murmur heard.   No friction rub. No gallop.  Pulmonary:     Effort: Pulmonary effort is normal.     Breath sounds: Normal breath sounds.  No wheezing or rales.  Musculoskeletal:     Cervical back: Neck supple.  Skin:    General: Skin is warm.     Findings: No rash.  Neurological:     Mental Status: She is alert and oriented to person, place, and time.  Psychiatric:        Behavior: Behavior normal.   Previous notes and tests were reviewed. The plan was reviewed with the patient/family, and all questions/concerned were addressed.  It was my pleasure to see [name] [name] today and participate in her care. Please feel free to contact me with any questions or concerns.  Sincerely,  Rexene Alberts, DO Allergy & Immunology  [facility] office: [place] office: [phone]

## 2021-09-05 ENCOUNTER — Encounter: Payer: Self-pay | Admitting: Allergy

## 2021-09-05 ENCOUNTER — Ambulatory Visit (INDEPENDENT_AMBULATORY_CARE_PROVIDER_SITE_OTHER): Payer: Commercial Managed Care - HMO | Admitting: Allergy

## 2021-09-05 ENCOUNTER — Other Ambulatory Visit: Payer: Self-pay

## 2021-09-05 VITALS — BP 130/78 | HR 72 | Temp 98.2°F | Resp 16

## 2021-09-05 DIAGNOSIS — J302 Other seasonal allergic rhinitis: Secondary | ICD-10-CM

## 2021-09-05 DIAGNOSIS — K219 Gastro-esophageal reflux disease without esophagitis: Secondary | ICD-10-CM | POA: Diagnosis not present

## 2021-09-05 DIAGNOSIS — J4540 Moderate persistent asthma, uncomplicated: Secondary | ICD-10-CM | POA: Diagnosis not present

## 2021-09-05 DIAGNOSIS — Z91030 Bee allergy status: Secondary | ICD-10-CM | POA: Diagnosis not present

## 2021-09-05 DIAGNOSIS — J3089 Other allergic rhinitis: Secondary | ICD-10-CM | POA: Diagnosis not present

## 2021-09-05 MED ORDER — FAMOTIDINE 40 MG PO TABS: 40.0000 mg | ORAL_TABLET | Freq: Every day | ORAL | 2 refills | Status: DC

## 2021-09-05 MED ORDER — MONTELUKAST SODIUM 10 MG PO TABS: 10.0000 mg | ORAL_TABLET | Freq: Every day | ORAL | 2 refills | Status: DC

## 2021-09-05 MED ORDER — BUDESONIDE-FORMOTEROL FUMARATE 160-4.5 MCG/ACT IN AERO: 2.0000 | INHALATION_SPRAY | Freq: Two times a day (BID) | RESPIRATORY_TRACT | 2 refills | Status: DC

## 2021-09-05 MED ORDER — ALBUTEROL SULFATE HFA 108 (90 BASE) MCG/ACT IN AERS: 2.0000 | INHALATION_SPRAY | RESPIRATORY_TRACT | 1 refills | Status: DC | PRN

## 2021-09-05 NOTE — Assessment & Plan Note (Signed)
Past history - 2018 skin testing was positive to dust mites and tree pollen. Interim history - controlled.  Continue environmental control measures.  Use Flonase (fluticasone) nasal spray 1 spray per nostril twice a day as needed for nasal congestion.   Nasal saline spray (i.e., Simply Saline) or nasal saline lavage (i.e., NeilMed) is recommended as needed and prior to medicated nasal sprays.  May use over the counter antihistamines such as Zyrtec (cetirizine), Claritin (loratadine), Allegra (fexofenadine), or Xyzal (levocetirizine) daily as needed.

## 2021-09-05 NOTE — Assessment & Plan Note (Signed)
Stable.   Continue Nexium 40mg  daily.  Continue pepcid 40mg  daily.

## 2021-09-05 NOTE — Patient Instructions (Addendum)
Moderate persistent asthma  Daily controller medication(s): continue Symbicort 138mcg 2 puffs twice a day with spacer and rinse mouth afterwards. Continue Singulair 10mg  daily  May use albuterol rescue inhaler 2 puffs every 4 to 6 hours as needed for shortness of breath, chest tightness, coughing, and wheezing. May use albuterol rescue inhaler 2 puffs 5 to 15 minutes prior to strenuous physical activities. Monitor frequency of use.  Asthma control goals:  Full participation in all desired activities (may need albuterol before activity) Albuterol use two times or less a week on average (not counting use with activity) Cough interfering with sleep two times or less a month Oral steroids no more than once a year No hospitalizations   Seasonal and perennial allergic rhinitis 2018 skin testing was positive to dust mites and tree pollen. Continue environmental control measures. Use Flonase (fluticasone) nasal spray 1 spray per nostril twice a day as needed for nasal congestion.  Nasal saline spray (i.e., Simply Saline) or nasal saline lavage (i.e., NeilMed) is recommended as needed and prior to medicated nasal sprays. May use over the counter antihistamines such as Zyrtec (cetirizine), Claritin (loratadine), Allegra (fexofenadine), or Xyzal (levocetirizine) daily as needed.   LPRD (laryngopharyngeal reflux disease) Continue Nexium 40mg  daily. Continue pepcid 40mg  daily.     Bee sting allergy For mild symptoms you can take over the counter antihistamines such as Benadryl and monitor symptoms closely. If symptoms worsen or if you have severe symptoms including breathing issues, throat closure, significant swelling, whole body hives, severe diarrhea and vomiting, lightheadedness then inject epinephrine and seek immediate medical care afterwards. Consider testing in future.   Follow up in 6 months or sooner if needed.

## 2021-09-05 NOTE — Assessment & Plan Note (Signed)
Well-controlled and no prednisone use.   Today's spirometry was unremarkable.  Daily controller medication(s): continue Symbicort 180mcg 2 puffs twice a day with spacer and rinse mouth afterwards. ? Continue Singulair 10mg  daily   May use albuterol rescue inhaler 2 puffs every 4 to 6 hours as needed for shortness of breath, chest tightness, coughing, and wheezing. May use albuterol rescue inhaler 2 puffs 5 to 15 minutes prior to strenuous physical activities. Monitor frequency of use.   Get spirometry at next visit.

## 2021-09-05 NOTE — Assessment & Plan Note (Signed)
No stings since last visit.   For mild symptoms you can take over the counter antihistamines such as Benadryl and monitor symptoms closely. If symptoms worsen or if you have severe symptoms including breathing issues, throat closure, significant swelling, whole body hives, severe diarrhea and vomiting, lightheadedness then inject epinephrine and seek immediate medical care afterwards.  Consider testing in future.

## 2021-09-10 ENCOUNTER — Encounter: Payer: Self-pay | Admitting: Podiatry

## 2021-09-10 ENCOUNTER — Other Ambulatory Visit: Payer: Self-pay

## 2021-09-10 ENCOUNTER — Ambulatory Visit (INDEPENDENT_AMBULATORY_CARE_PROVIDER_SITE_OTHER): Payer: Commercial Managed Care - HMO | Admitting: Podiatry

## 2021-09-10 DIAGNOSIS — R748 Abnormal levels of other serum enzymes: Secondary | ICD-10-CM | POA: Insufficient documentation

## 2021-09-10 DIAGNOSIS — M79674 Pain in right toe(s): Secondary | ICD-10-CM | POA: Diagnosis not present

## 2021-09-10 DIAGNOSIS — R635 Abnormal weight gain: Secondary | ICD-10-CM | POA: Insufficient documentation

## 2021-09-10 DIAGNOSIS — M79675 Pain in left toe(s): Secondary | ICD-10-CM

## 2021-09-10 DIAGNOSIS — L84 Corns and callosities: Secondary | ICD-10-CM

## 2021-09-10 DIAGNOSIS — K5730 Diverticulosis of large intestine without perforation or abscess without bleeding: Secondary | ICD-10-CM | POA: Insufficient documentation

## 2021-09-10 DIAGNOSIS — B351 Tinea unguium: Secondary | ICD-10-CM | POA: Diagnosis not present

## 2021-09-10 DIAGNOSIS — K5900 Constipation, unspecified: Secondary | ICD-10-CM | POA: Insufficient documentation

## 2021-09-10 DIAGNOSIS — E1142 Type 2 diabetes mellitus with diabetic polyneuropathy: Secondary | ICD-10-CM

## 2021-09-10 DIAGNOSIS — K5904 Chronic idiopathic constipation: Secondary | ICD-10-CM | POA: Insufficient documentation

## 2021-09-11 ENCOUNTER — Telehealth: Payer: Self-pay

## 2021-09-11 NOTE — Telephone Encounter (Signed)
Second time we have received this Will not fill [name] [name]

## 2021-09-11 NOTE — Telephone Encounter (Signed)
Received New Prescription request from [facility] for the following:  Sertraline HCL Tab Not other information provided.  Ottis Stain, CMA

## 2021-09-15 ENCOUNTER — Other Ambulatory Visit: Payer: Self-pay | Admitting: Family Medicine

## 2021-09-15 DIAGNOSIS — E1149 Type 2 diabetes mellitus with other diabetic neurological complication: Secondary | ICD-10-CM

## 2021-09-15 NOTE — Progress Notes (Signed)
Subjective:  Patient ID: [name] [name], female    DOB: 1951/07/01,  MRN: [id number]  [name] [name] presents to clinic today for at risk foot care with history of peripheral neuropathy and callus(es) of both feet and painful thick toenails that are difficult to trim. Painful toenails interfere with ambulation. Aggravating factors include wearing enclosed shoe gear. Pain is relieved with periodic professional debridement. Painful calluses are aggravated when weightbearing with and without shoegear. Pain is relieved with periodic professional debridement.  Patient did not check blood glucose today.  PCP is Dickie La, MD , and last visit was 08/01/2021.  Allergies  Allergen Reactions   Bee Venom Hives and Swelling   Propoxyphene Hcl Itching   Amlodipine Besylate Swelling   Hydrocodone Other (See Comments)    With Vicodin - makes patient "jittery", and "hangover effect - sleepy next day"   Lisinopril Cough    Changed to ARB   Metformin And Related Diarrhea    GI distress   Metronidazole Other (See Comments)    REACTION: "just didn't work; my body never did heal from it"   Valsartan Other (See Comments)    REACTION:  "sleep more the next day after I took it; it made me real tired"   Propoxyphene     Other reaction(s): Unknown   Sulfasalazine     Other reaction(s): Unknown   Xyzal [Levocetirizine Dihydrochloride] Rash    Review of Systems: Negative except as noted in the HPI. Objective:   Constitutional [name] [name] is a pleasant 71 y.o. African American female, obese in NAD. AAO x 3.   Vascular CFT immediate b/l LE. Palpable DP/PT pulses b/l LE. Digital hair absent b/l. Skin temperature gradient WNL b/l. No pain with calf compression b/l. Trace edema noted b/l. No cyanosis or clubbing noted b/l LE.  Neurologic Normal speech. Oriented to person, place, and time. Pt has subjective symptoms of neuropathy. Protective sensation intact 5/5 intact bilaterally  with 10g monofilament b/l. Vibratory sensation intact b/l.  Dermatologic Pedal integument with normal turgor, texture and tone b/l LE. No open wounds b/l. No interdigital macerations b/l. Toenails 1-5 b/l elongated, thickened, discolored with subungual debris. +Tenderness with dorsal palpation of nailplates. Hyperkeratotic lesion(s) noted bilateral heels, submet head 1 right foot, and submet head 4 left foot.  Orthopedic: Muscle strength 5/5 to all lower extremity muscle groups bilaterally. Hammertoe deformity noted 2-5 b/l. Pes planus deformity noted bilateral LE. Utilizes rollator for ambulation assistance.   Radiographs: None  Last A1c:  Hemoglobin A1C Latest Ref Rng & Units 08/01/2021 04/18/2021 01/10/2021  HGBA1C 0.0 - 7.0 % 6.5 6.6 6.6  Some recent data might be hidden   Assessment:   1. Pain due to onychomycosis of toenails of both feet   2. Callus   3. Diabetic peripheral neuropathy associated with type 2 diabetes mellitus ([place])    Plan:  Patient was evaluated and treated and all questions answered. Consent given for treatment as described below: -Continue diabetic foot care principles: inspect feet daily, monitor glucose as recommended by PCP and/or Endocrinologist, and follow prescribed diet per PCP, Endocrinologist and/or dietician. -Mycotic toenails 1-5 bilaterally were debrided in length and girth with sterile nail nippers and dremel without incident. -Callus(es) bilateral heels, submet head 1 right foot, and submet head 4 left foot pared utilizing sterile scalpel blade without complication or incident. Total number debrided =4. -Patient/POA to call should there be question/concern in the interim.  Return in about 3 months (around 12/09/2021).  [name] Malta  Tawni [name], DPM

## 2021-09-17 ENCOUNTER — Other Ambulatory Visit: Payer: Self-pay

## 2021-09-17 MED ORDER — ACCU-CHEK GUIDE VI STRP: ORAL_STRIP | 12 refills | Status: DC

## 2021-09-17 MED ORDER — ACCU-CHEK GUIDE W/DEVICE KIT: PACK | 0 refills | Status: DC

## 2021-09-17 MED ORDER — ACCU-CHEK FASTCLIX LANCETS MISC: 12 refills | Status: DC

## 2021-09-17 NOTE — Telephone Encounter (Signed)
Patient calls nurse line requesting diabetic supplies sent to [facility]. Patient reports she is testing TID and has used Accu Chek in the past.   Please advise.

## 2021-09-19 ENCOUNTER — Other Ambulatory Visit: Payer: Self-pay | Admitting: Family Medicine

## 2021-09-26 ENCOUNTER — Other Ambulatory Visit: Payer: Self-pay

## 2021-09-26 ENCOUNTER — Ambulatory Visit: Payer: Commercial Managed Care - HMO

## 2021-09-26 DIAGNOSIS — E1142 Type 2 diabetes mellitus with diabetic polyneuropathy: Secondary | ICD-10-CM

## 2021-09-26 NOTE — Progress Notes (Signed)
SITUATION Reason for Consult: Evaluation for Prefabricated Diabetic Shoes and Bilateral Custom Diabetic Inserts. Patient / Caregiver Report: Patient would like well fitting shoes  OBJECTIVE DATA: Patient History / Diagnosis:    ICD-10-CM   1. Diabetic peripheral neuropathy associated with type 2 diabetes mellitus (HCC)  E11.42       Current or Previous Devices:   Apex A3200W  In-Person Foot Examination: Ulcers & Callousing:   None  Toe / Foot Deformities:   - Pes Planus    Shoe Size: 6W   ORTHOTIC RECOMMENDATION Recommended Devices: - 1x pair prefabricated PDAC approved diabetic shoes: Patient Selected Apex A3200W 6W - 3x pair custom-to-patient vacuum formed diabetic insoles.   GOALS OF SHOES AND INSOLES - Reduce shear and pressure - Reduce / Prevent callus formation - Reduce / Prevent ulceration - Protect the fragile healing compromised diabetic foot.  Patient would benefit from diabetic shoes and inserts as patient has diabetes mellitus and the patient has one or more of the following conditions: - Peripheral neuropathy with evidence of callus formation - Foot deformity - Poor circulation  ACTIONS PERFORMED Patient was casted for insoles via crush box and measured for shoes via brannock device. Procedure was explained and patient tolerated procedure well. All questions were answered and concerns addressed.  PLAN Patient is to ensure treating physician receives and completes diabetic paperwork. Casts and shoe order are to be held until paperwork is received. Once received patient is to be scheduled for fitting in four weeks.

## 2021-09-27 DIAGNOSIS — G4733 Obstructive sleep apnea (adult) (pediatric): Secondary | ICD-10-CM | POA: Diagnosis not present

## 2021-10-04 ENCOUNTER — Telehealth (HOSPITAL_BASED_OUTPATIENT_CLINIC_OR_DEPARTMENT_OTHER): Payer: Medicare Other | Admitting: Psychiatry

## 2021-10-04 ENCOUNTER — Other Ambulatory Visit: Payer: Self-pay

## 2021-10-04 ENCOUNTER — Other Ambulatory Visit (HOSPITAL_COMMUNITY): Payer: Self-pay | Admitting: Psychiatry

## 2021-10-04 DIAGNOSIS — F411 Generalized anxiety disorder: Secondary | ICD-10-CM | POA: Diagnosis not present

## 2021-10-04 DIAGNOSIS — F330 Major depressive disorder, recurrent, mild: Secondary | ICD-10-CM

## 2021-10-04 MED ORDER — DULOXETINE HCL 60 MG PO CPEP: 60.0000 mg | ORAL_CAPSULE | Freq: Every day | ORAL | 0 refills | Status: DC

## 2021-10-04 MED ORDER — BUPROPION HCL 100 MG PO TABS: 100.0000 mg | ORAL_TABLET | Freq: Two times a day (BID) | ORAL | 0 refills | Status: DC

## 2021-10-04 NOTE — Progress Notes (Signed)
Virtual Visit via Telephone Note  I connected with [name] [name] on 10/04/21 at  4:30 PM EST by telephone and verified that I am speaking with the correct person using two identifiers. [name] [name] was not able to understand how to start a video chat despite multiple attempts.   Location: Patient: home Provider: office   I discussed the limitations, risks, security and privacy concerns of performing an evaluation and management service by telephone and the availability of in person appointments. I also discussed with the patient that there may be a patient responsible charge related to this service. The patient expressed understanding and agreed to proceed.   History of Present Illness: [name] [name] is doing well and has no concerns at this time. She denies depression in the last 3 weeks ago when the weather was really bad. She spends most of her time watching tv or sleeping. She enjoys window shopping and going to [place]. She denies anhedonia and hopelessness. Her sleep is variable. Some night she sleeps really well with meds and CPAP. She does not sleep well when she does not use her CPAP. Her appetite is good. Her energy is limited most days. [name] [name] has to force herself to do things sometimes. Her anxiety is mild and she can't recall the last time she felt really anxious. She denies SI/HI. She denies AVH.    Observations/Objective:  General Appearance: unable to assess  Eye Contact:  unable to assess  Speech:  Clear and Coherent and Normal Rate  Volume:  Normal  Mood:  Euthymic  Affect:  Full Range  Thought Process:  Goal Directed, Linear, and Descriptions of Associations: Intact  Orientation:  Full (Time, Place, and Person)  Thought Content:  Logical  Suicidal Thoughts:  No  Homicidal Thoughts:  No  Memory:  Immediate;   Good  Judgement:  Good  Insight:  Good  Psychomotor Activity: unable to assess  Concentration:  Concentration: Good  Recall:  Good  Fund of Knowledge:  Good   Language:  Good  Akathisia:  unable to assess  Handed:  unable to assess  AIMS (if indicated):     Assets:  Communication Skills Desire for Improvement Financial Resources/Insurance Housing Resilience Social Support Talents/Skills Transportation Vocational/Educational  ADL's:  unable to assess  Cognition:  WNL  Sleep:        Assessment and Plan: Depression screen [facility] 2/9 10/04/2021 08/01/2021 07/05/2021 04/18/2021 04/12/2021  Decreased Interest 0 0 0 0 0  Down, Depressed, Hopeless 0 0 0 0 0  PHQ - 2 Score 0 0 0 0 0  Altered sleeping - - 0 2 -  Tired, decreased energy - - 0 1 -  Change in appetite - - 0 1 -  Feeling bad or failure about yourself  - - 0 0 -  Trouble concentrating - - 0 0 -  Moving slowly or fidgety/restless - - 0 0 -  Suicidal thoughts - - 0 0 -  PHQ-9 Score - - 0 4 -  Difficult doing work/chores - - Not difficult at all - -  Some recent data might be hidden   Flowsheet Row Video Visit from 10/04/2021 in [facility] Office Visit from 07/05/2021 in [facility] Video Visit from 04/12/2021 in [facility] No Risk No Risk No Risk       1. GAD (generalized anxiety disorder) - buPROPion (WELLBUTRIN) 100 MG tablet; Take 1 tablet (100 mg total) by mouth  2 (two) times daily.  Dispense: 180 tablet; Refill: 0 - DULoxetine (CYMBALTA) 60 MG capsule; Take 1 capsule (60 mg total) by mouth daily.  Dispense: 90 capsule; Refill: 0  2. Mild episode of recurrent major depressive disorder (HCC) - buPROPion (WELLBUTRIN) 100 MG tablet; Take 1 tablet (100 mg total) by mouth 2 (two) times daily.  Dispense: 180 tablet; Refill: 0 - DULoxetine (CYMBALTA) 60 MG capsule; Take 1 capsule (60 mg total) by mouth daily.  Dispense: 90 capsule; Refill: 0    Follow Up Instructions: In 2-3 months or sooner if needed   I discussed the assessment and  treatment plan with the patient. The patient was provided an opportunity to ask questions and all were answered. The patient agreed with the plan and demonstrated an understanding of the instructions.   The patient was advised to call back or seek an in-person evaluation if the symptoms worsen or if the condition fails to improve as anticipated.  I provided 11 minutes of non-face-to-face time during this encounter.   Charlcie Cradle, MD

## 2021-10-25 DIAGNOSIS — H401133 Primary open-angle glaucoma, bilateral, severe stage: Secondary | ICD-10-CM | POA: Diagnosis not present

## 2021-10-29 ENCOUNTER — Telehealth: Payer: Self-pay

## 2021-10-29 NOTE — Telephone Encounter (Signed)
Request from [facility] received for refill of the following medication;  Medication not seen on med list. Sertraline HCL Tab No other information given for Strength or instructions.  Ottis Stain, CMA

## 2021-10-30 NOTE — Telephone Encounter (Signed)
This has happened before. She is nolonger ion that medicine. THANKS! [name] [name]

## 2021-11-06 ENCOUNTER — Ambulatory Visit: Payer: Medicare Other | Admitting: Internal Medicine

## 2021-11-07 ENCOUNTER — Ambulatory Visit (INDEPENDENT_AMBULATORY_CARE_PROVIDER_SITE_OTHER): Payer: Medicare Other | Admitting: Family Medicine

## 2021-11-07 ENCOUNTER — Encounter: Payer: Self-pay | Admitting: Family Medicine

## 2021-11-07 ENCOUNTER — Other Ambulatory Visit: Payer: Self-pay

## 2021-11-07 VITALS — BP 167/73 | HR 81 | Ht 61.0 in | Wt 177.0 lb

## 2021-11-07 DIAGNOSIS — R21 Rash and other nonspecific skin eruption: Secondary | ICD-10-CM | POA: Diagnosis not present

## 2021-11-07 DIAGNOSIS — M1610 Unilateral primary osteoarthritis, unspecified hip: Secondary | ICD-10-CM | POA: Diagnosis not present

## 2021-11-07 DIAGNOSIS — H543 Unqualified visual loss, both eyes: Secondary | ICD-10-CM | POA: Diagnosis not present

## 2021-11-07 DIAGNOSIS — F339 Major depressive disorder, recurrent, unspecified: Secondary | ICD-10-CM

## 2021-11-07 DIAGNOSIS — Z79899 Other long term (current) drug therapy: Secondary | ICD-10-CM

## 2021-11-07 DIAGNOSIS — E1149 Type 2 diabetes mellitus with other diabetic neurological complication: Secondary | ICD-10-CM | POA: Diagnosis not present

## 2021-11-07 DIAGNOSIS — R269 Unspecified abnormalities of gait and mobility: Secondary | ICD-10-CM | POA: Diagnosis not present

## 2021-11-07 LAB — POCT GLYCOSYLATED HEMOGLOBIN (HGB A1C): HbA1c, POC (controlled diabetic range): 6.3 % (ref 0.0–7.0)

## 2021-11-07 MED ORDER — LEVOCETIRIZINE DIHYDROCHLORIDE 5 MG PO TABS: 5.0000 mg | ORAL_TABLET | Freq: Every evening | ORAL | 0 refills | Status: DC

## 2021-11-07 MED ORDER — LINZESS 290 MCG PO CAPS: 290.0000 ug | ORAL_CAPSULE | Freq: Every day | ORAL | Status: DC | PRN

## 2021-11-07 MED ORDER — MOMETASONE FUROATE 0.1 % EX CREA: TOPICAL_CREAM | CUTANEOUS | 5 refills | Status: DC

## 2021-11-07 MED ORDER — INSULIN LISPRO (1 UNIT DIAL) 100 UNIT/ML (KWIKPEN): 20.0000 [IU] | PEN_INJECTOR | Freq: Every day | SUBCUTANEOUS | 3 refills | Status: DC

## 2021-11-07 NOTE — Progress Notes (Signed)
? ? ?CHIEF COMPLAINT / HPI: ?#1.  Follow-up diabetes mellitus.  She has been taking her medicines on her own schedule.  Not having low blood sugar episodes.  She is not taking her long-acting insulin twice daily as directed if she does not eat more than 1 meal a day.  She is also taking her blood pressure medicine differently.  She is going to have some dental work done and they need a copy of her medicines. ?She also wants a new glucometer.  The one they replaced her with is very very small and hard for her to see.  They gave her an Accu-Chek.  The screen is about 1-1/2 inches x 2.5 inches.  She is checking her blood sugar intermittently.  Not having anything really above 200 and no exceptionally low ones. ?#2.  Continues to follow with psychiatry.  Her mood has been fairly stable.  She still does not go out much because she is still hesitant about COVID infection.  She feels more stable in her current living situation now that her son has moved out.  She still active in her church.  She may plan to go to a fishing tournament this summer.  If so she will need some paperwork filled out ?3.  Hypertension: Taking her medicines on her own regimen.  No chest pain, no shortness of breath other than her baseline that is with exertion.  No lower extremity swelling.  Not having to sleep on any unusual amount of pillows. ? ? ?PERTINENT  PMH / PSH: I have reviewed the patient?s medications, allergies, past medical and surgical history, smoking status and updated in the EMR as appropriate. ?Legally blind ?Significant mobility problems requiring walker.  Would also benefit from elevated commode seat to prevent falls. ? ?OBJECTIVE: ? BP (!) 167/73   Pulse 81   Ht '5\' 1"'$  (1.549 m)   Wt 177 lb (80.3 kg)   BMI 33.44 kg/m?  ? ?GENERAL: Well-developed female no acute distress ?HEENT: Blind bilaterally.  Resting nystagmus that is horizontal.  She can see some vague shadows. ?CV: Regular rate and rhythm ?LUNGS: Clear to  auscultation bilaterally.  Normal work of breathing.  No rales or wheezes. ?ABDOMEN: Soft, positive bowel sounds, nontender ?EXTREMITY: No edema ?GAIT: Wide-based, not stable.  She can rise from a chair but it is only with the assistance of her walker that she is able to significantly attain balance. ?ASSESSMENT / PLAN: ? ? ?Diabetes mellitus type 2 with neurological manifestations ([place]) ?Check labs today.  Will change her medications.  They are updated on her medicine list. ? ?GAIT IMBALANCE ?Will order elevated commode seat and a wheeled walker with seat.  I am concerned that her balance is more impaired than previously.  General office related to chronic progressive peripheral neuropathy combined with her blindness.  DME orders placed. ? ?Major depressive disorder, recurrent episode ?She continues with telehealth psychiatry and therapy.  This has been extremely beneficial for her.  They are monitoring her medicines.  She is not having any problems with them.  I encouraged her to continue. ? ?Medication management ?Unfortunately an outside facility had tried to update her medicine list and ended up putting a lot of duplicate and incorrect information in there.  Jaycie and I reviewed at length.  She notes most of her medicines and has admittedly changed her dosing on some of them.  I updated her med list of what she is actually taking.  The only thing we are not sure  of, is whether or not she still taking Cozaar.  Does not look like it is been prescribed since 2018 but she thought she was on it.  She is going to bring her medications in a big bag when I see her back in about 4 weeks.  I will send the current list to her dentist as requested. ? ?We also agreed to try stopping the acyclovir.  She takes it for diagnosis of herpes but has not had any type of outbreak in many many years.  Since she is having 71 problems with itching at night, we will try to trim her medicine list is much as possible.  I also gave her  a refill of some topical steroid cream to use as needed on her arms. ? ?Rash and nonspecific skin eruption ?Initially this started with a what we thought was a bedbug infestation.  We treated her multiple times over the last year.  She is also seeing dermatology without much effect.  Reading then dermatologist notes from piecing this together, I think she has some continued pruritus it is mostly bothering her when she lays down in bed at night.  I do not know whether there is still an infestation or not.  When I examine her, I see no sign of bedbug bites or rash.  It could be that that was so traumatic she is just having some recurrent itching as a sequela.  I will give her some low-dose corticosteroid ointment or cream for her upper extremities to use as needed.  We will also try to trim her medication list of anything unnecessary.  I will follow this up when I see her in 4 weeks as well.  I suspect that there is no current infestation or I will be seeing something on exam. ?  ?[name] MD ?

## 2021-11-07 NOTE — Patient Instructions (Signed)
Let me see you in a month ? ?Decreased metoprolol to one tab at night ?We decreased your humalog from 35 twice a day to 20 once a day ? ?We are going to HOLD the acyclovir for now ? ?

## 2021-11-08 ENCOUNTER — Telehealth: Payer: Self-pay

## 2021-11-08 DIAGNOSIS — Z0000 Encounter for general adult medical examination without abnormal findings: Secondary | ICD-10-CM | POA: Insufficient documentation

## 2021-11-08 DIAGNOSIS — Z79899 Other long term (current) drug therapy: Secondary | ICD-10-CM | POA: Insufficient documentation

## 2021-11-08 NOTE — Assessment & Plan Note (Signed)
Check labs today.  Will change her medications.  They are updated on her medicine list. ?

## 2021-11-08 NOTE — Assessment & Plan Note (Signed)
Will order elevated commode seat and a wheeled walker with seat.  I am concerned that her balance is more impaired than previously.  General office related to chronic progressive peripheral neuropathy combined with her blindness.  DME orders placed. ?

## 2021-11-08 NOTE — Assessment & Plan Note (Signed)
She continues with telehealth psychiatry and therapy.  This has been extremely beneficial for her.  They are monitoring her medicines.  She is not having any problems with them.  I encouraged her to continue. ?

## 2021-11-08 NOTE — Assessment & Plan Note (Addendum)
Unfortunately an outside facility had tried to update her medicine list and ended up putting a lot of duplicate and incorrect information in there.  [name] [name] and I reviewed at length.  She notes most of her medicines and has admittedly changed her dosing on some of them.  I updated her med list of what she is actually taking.  The only thing we are not sure of, is whether or not she still taking Cozaar.  Does not look like it is been prescribed since 2018 but she thought she was on it.  She is going to bring her medications in a big bag when I see her back in about 4 weeks.  I will send the current list to her dentist as requested. ? ?We also agreed to try stopping the acyclovir.  She takes it for diagnosis of herpes but has not had any type of outbreak in many many years.  Since she is having 71 problems with itching at night, we will try to trim her medicine list is much as possible.  I also gave her a refill of some topical steroid cream to use as needed on her arms. ?

## 2021-11-08 NOTE — Assessment & Plan Note (Signed)
Initially this started with a what we thought was a bedbug infestation.  We treated her multiple times over the last year.  She is also seeing dermatology without much effect.  Reading then dermatologist notes from piecing this together, I think she has some continued pruritus it is mostly bothering her when she lays down in bed at night.  I do not know whether there is still an infestation or not.  When I examine her, I see no sign of bedbug bites or rash.  It could be that that was so traumatic she is just having some recurrent itching as a sequela.  I will give her some low-dose corticosteroid ointment or cream for her upper extremities to use as needed.  We will also try to trim her medication list of anything unnecessary.  I will follow this up when I see her in 4 weeks as well.  I suspect that there is no current infestation or I will be seeing something on exam. ?

## 2021-11-08 NOTE — Telephone Encounter (Signed)
Casts Sent to [employer] - HOLD for CMN °

## 2021-11-12 ENCOUNTER — Telehealth: Payer: Self-pay

## 2021-11-12 NOTE — Telephone Encounter (Signed)
Casts Sent to [employer] - HOLD for CMN °

## 2021-11-13 ENCOUNTER — Other Ambulatory Visit: Payer: Self-pay

## 2021-11-13 ENCOUNTER — Ambulatory Visit (INDEPENDENT_AMBULATORY_CARE_PROVIDER_SITE_OTHER): Payer: Medicare Other | Admitting: Internal Medicine

## 2021-11-13 ENCOUNTER — Encounter: Payer: Self-pay | Admitting: Internal Medicine

## 2021-11-13 VITALS — BP 140/86 | HR 71 | Ht 61.0 in | Wt 179.0 lb

## 2021-11-13 DIAGNOSIS — Z79899 Other long term (current) drug therapy: Secondary | ICD-10-CM

## 2021-11-13 DIAGNOSIS — I10 Essential (primary) hypertension: Secondary | ICD-10-CM

## 2021-11-13 DIAGNOSIS — R002 Palpitations: Secondary | ICD-10-CM

## 2021-11-13 NOTE — Progress Notes (Signed)
? ?Cardiology Office Note ? ? ?Date:  11/13/2021  ? ?ID:  [name] [name], DOB [birth year], MRN [id number] ? ?PCP:  Dickie La, MD  ?Cardiologist:   Dorris Carnes, MD  ? ?Pt presents for follow up of HTN ? ?  ?History of Present Illness: ?[name] [name] is a 71 y.o. female with a history of ?HTN, HL, PAT  and aortic sclerosis    ? ?I saw the pt back in December 2021  The patient says since seen she is not having  palpitations   Feels occasional "jolt" across chest   Usually occurs with change in position, not with other activity  No dizziness  She says it is happening less often ?She takes BP some at home   Says it is in the 150 / range     ? ?Current Meds  ?Medication Sig  ? Accu-Chek FastClix Lancets MISC Use to check blood sugars 3x per day. E11.9  ? albuterol (VENTOLIN HFA) 108 (90 Base) MCG/ACT inhaler Inhale 2 puffs into the lungs every 4 (four) hours as needed for wheezing or shortness of breath (coughing fits).  ? Alcohol Swabs (B-D SINGLE USE SWABS REGULAR) PADS USE THREE TIMES DAILY  ? allopurinol (ZYLOPRIM) 300 MG tablet TAKE 1 TABLET BY MOUTH  DAILY  ? ammonium lactate (LAC-HYDRIN) 12 % lotion APPLY 1 APPLICATION TOPICALLY TWICE DAILY AS NEEDED FOR DRY SKIN (SUBSTITUTED FOR LAC HYDRIN)  ? aspirin EC 81 MG tablet Take 81 mg by mouth daily.  ? atorvastatin (LIPITOR) 40 MG tablet TAKE 1 TABLET BY MOUTH  DAILY  ? B-D ULTRAFINE III SHORT PEN 31G X 8 MM MISC USE TO INJECT THREE TIMES A DAY  ? Blood Glucose Monitoring Suppl (ACCU-CHEK GUIDE) w/Device KIT Use to check blood sugars 3x per day. E11.9  ? Brimonidine Tartrate-Timolol (COMBIGAN OP) Combigan  ? budesonide-formoterol (SYMBICORT) 160-4.5 MCG/ACT inhaler Inhale 2 puffs into the lungs in the morning and at bedtime. with spacer and rinse mouth afterwards.  ? buPROPion (WELLBUTRIN) 100 MG tablet Take 1 tablet (100 mg total) by mouth 2 (two) times daily.  ? COMBIGAN 0.2-0.5 % ophthalmic solution INSTILL 1 DROP INTO THE  LEFT EYE EVERY 12 HOURS   ? Dulaglutide (TRULICITY) 1.5 KG/4.0NU SOPN INJECT THE CONTENTS OF ONE  PEN SUBCUTANEOUSLY WEEKLY  AS DIRECTED  ? DULoxetine (CYMBALTA) 60 MG capsule Take 1 capsule (60 mg total) by mouth daily.  ? ezetimibe (ZETIA) 10 MG tablet TAKE 1 TABLET BY MOUTH  DAILY  ? famotidine (PEPCID) 40 MG tablet Take 1 tablet (40 mg total) by mouth daily.  ? fluticasone (FLONASE) 50 MCG/ACT nasal spray Place 1 spray into both nostrils in the morning and at bedtime.  ? furosemide (LASIX) 40 MG tablet Take one tablet by mouth daily  ? glucose blood (ACCU-CHEK GUIDE) test strip Use to check blood sugars 3x per day. E11.9  ? Insulin Lispro (HUMALOG KWIKPEN [place]) Humalog KwikPen  ? insulin lispro (HUMALOG KWIKPEN) 100 UNIT/ML KwikPen Inject 20 Units into the skin daily after supper.  ? Insulin Syringe-Needle U-100 (BD INSULIN SYRINGE ULTRAFINE) 31G X 15/64" 0.5 ML MISC Use to inject three times a day  ? LANTUS SOLOSTAR 100 UNIT/ML Solostar Pen INJECT SUBCUTANEOUSLY 35  UNITS TWICE DAILY  ? latanoprost (XALATAN) 0.005 % ophthalmic solution Place 1 drop into both eyes at bedtime.  ? levocetirizine (XYZAL) 5 MG tablet Take 1 tablet (5 mg total) by mouth every evening.  ? LINZESS 290 MCG CAPS capsule Take  1 capsule (290 mcg total) by mouth daily as needed.  ? metoCLOPramide (REGLAN) 10 MG tablet 1/2 tab(s) orally 4 times a day (before meals and at bedtime) for 30 day(s)  ? metoprolol succinate (TOPROL-XL) 50 MG 24 hr tablet Take one tablet at bedtime for blood pressure  ? mometasone (ELOCON) 0.1 % cream Apply to affected area daily as needed do not use on face  ? montelukast (SINGULAIR) 10 MG tablet Take 1 tablet (10 mg total) by mouth daily.  ? Multiple Vitamins-Minerals (MULTIVITAMIN PO) Take 1 tablet by mouth daily.  ? RESTASIS MULTIDOSE 0.05 % ophthalmic emulsion Place 1 drop into both eyes daily as needed.  ? spironolactone (ALDACTONE) 25 MG tablet TAKE 1 TABLET BY MOUTH  DAILY BEFORE SUPPER  ? ? ? ?Allergies:   Bee venom, Propoxyphene  hcl, Amlodipine besylate, Hydrocodone, Lisinopril, Metformin and related, Metronidazole, Valsartan, Propoxyphene, Sulfasalazine, and Xyzal [levocetirizine dihydrochloride]  ? ?Past Medical History:  ?Diagnosis Date  ? Allergy   ? Angina   ? Anxiety   ? Arthritis   ? Asthma   ? Blood transfusion   ? Blood transfusion without reported diagnosis   ? Cataract   ? Chronic cough   ? Now followed by pulmonary  ? Chronic lower back pain   ? Complication of anesthesia   ? "just wake up coughing; that's all"  ? Concussion 11/18/11  ? "fell at dr's office; hit head"  ? COPD (chronic obstructive pulmonary disease) ([place])   ? Delayed gastric emptying   ? Depression   ? Diabetes mellitus type II   ? "take insulin & pills"  ? Dyslipidemia   ? Exertional dyspnea   ? GERD (gastroesophageal reflux disease)   ? Glaucoma   ? Gout   ? Headache(784.0) 11/18/11  ? "I've had mild headaches the last couple days"  ? Headache(784.0) 01/08/12  ? "pretty constant since 11/18/11's concussion"  ? Heart murmur   ? History of colonoscopy   ? HTN (hypertension)   ? Myocardial infarction [facility])   ? Osteoporosis   ? Peripheral neuropathy   ? Pneumonia   ? "i've had it once" (11/18/11)  ? Sleep apnea   ? on CPAP 12  ? Syncope 06/16/2012  ? Vocal cord paralysis, unilateral partial   ? "right"  ? ? ?Past Surgical History:  ?Procedure Laterality Date  ? ANTERIOR CERVICAL DECOMP/DISCECTOMY FUSION  01/15/2012  ? Procedure: ANTERIOR CERVICAL DECOMPRESSION/DISCECTOMY FUSION 3 LEVELS;  Surgeon: Eustace Moore, MD;  Location: [facility];  Service: Neurosurgery;  Laterality: N/A;  Anterior Cervical Decompression Discectomy Fusion Cerivcal three four, cervical four five, cervical five six, cervical six seven  ? BREAST BIOPSY    ? bilaterally  ? BREAST CYST EXCISION    ? right twice; left once  ? BREAST REDUCTION SURGERY  04-21-2003  ? BRONCHOSCOPY  07-2007  ? CARDIAC CATHETERIZATION  03-02-2004  ? CARPAL TUNNEL RELEASE    ? left  ? CATARACT EXTRACTION  1955; 1979  ?  bilateral; right eye  ? [place]  ? COLONOSCOPY W/ BIOPSIES  11/21/2010  ? adenoma polyp--no hi grade dysplasia Dr Collene Mares  ? EYE SURGERY    ? KNEE ARTHROSCOPY  08/2000  ? right  ? LEFT AND RIGHT HEART CATHETERIZATION WITH CORONARY ANGIOGRAM N/A 01/19/2014  ? Procedure: LEFT AND RIGHT HEART CATHETERIZATION WITH CORONARY ANGIOGRAM;  Surgeon: Sinclair Grooms, MD;  Location: [facility] CATH LAB;  Service: Cardiovascular;  Laterality: N/A;  ? REDUCTION  MAMMAPLASTY Bilateral   ? SHOULDER ARTHROSCOPY  08/2000  ? right  ? SHOULDER ARTHROSCOPY W/ ROTATOR CUFF REPAIR  10/18/2003  ? left  ? SPINE SURGERY    ? T-score  09/02/04  ? -0.54 (low risk currently)  ? TOTAL ABDOMINAL HYSTERECTOMY  10-07-2000  ? TREATMENT FISTULA ANAL    ? TUBAL LIGATION  1987  ? ? ? ?Social History:  The patient  reports that she quit smoking about 46 years ago. Her smoking use included cigarettes. She has a 0.25 pack-year smoking history. She has never used smokeless tobacco. She reports that she does not drink alcohol and does not use drugs.  ? ?Family History:  The patient's family history includes Cancer in her sister; Congestive Heart Failure in her brother; Diabetes in her father; Heart disease in her mother; Hyperlipidemia in her brother and sister; Hypertension in her brother, sister, and sister.  ? ? ?ROS:  Please see the history of present illness. All other systems are reviewed and  Negative to the above problem except as noted.  ? ? ?PHYSICAL EXAM: ?VS:  BP 140/86 (BP Location: Right Arm, Patient Position: Sitting, Cuff Size: Normal)   Pulse 71   Ht _0  (1.549 m)   Wt 179 lb (81.2 kg)   SpO2 99%   BMI 33.82 kg/m?   ?GEN: Morbidly obese 71 yo , in no acute distress  ?Neck: no JVD ?Cardiac: RRR; NO signif murmurs   ?Respiratory:  clear to auscultation bilaterally, ?GI: soft, nontender, obese   ?MS: no deformity Moving all extremities   ?Skin: warm and dry, no rash   ? ?EKG:  EKG is ordered today.   SR 71 bpm   ? ? ?Lipid Panel ?    ?Component Value Date/Time  ? CHOL 117 06/14/2020 1108  ? TRIG 79 06/14/2020 1108  ? HDL 43 06/14/2020 1108  ? CHOLHDL 2.7 06/14/2020 1108  ? CHOLHDL 3.7 10/20/2015 1212  ? VLDL 16 10/20/2015 1212  ? [place] 58 06/14/2020

## 2021-11-13 NOTE — Patient Instructions (Signed)
Medication Instructions:  ?Your physician recommends that you continue on your current medications as directed. Please refer to the Current Medication list given to you today. ? ?*If you need a refill on your cardiac medications before your next appointment, please call your pharmacy* ? ? ?Lab Work: ?BMET, CBC, LIPID ?If you have labs (blood work) drawn today and your tests are completely normal, you will receive your results only by: ?MyChart Message (if you have MyChart) OR ?A paper copy in the mail ?If you have any lab test that is abnormal or we need to change your treatment, we will call you to review the results. ? ? ?Testing/Procedures: ?NONE ? ? ?Follow-Up: ?At [facility], you and your health needs are our priority.  As part of our continuing mission to provide you with exceptional heart care, we have created designated Provider Care Teams.  These Care Teams include your primary Cardiologist (physician) and Advanced Practice Providers (APPs -  Physician Assistants and Nurse Practitioners) who all work together to provide you with the care you need, when you need it. ? ?We recommend signing up for the patient portal called "MyChart".  Sign up information is provided on this After Visit Summary.  MyChart is used to connect with patients for Virtual Visits (Telemedicine).  Patients are able to view lab/test results, encounter notes, upcoming appointments, etc.  Non-urgent messages can be sent to your provider as well.   ?To learn more about what you can do with MyChart, go to [email or web address].   ? ?Your next appointment:   ?1 year(s) ? ?The format for your next appointment:   ?In Person ? ?Provider:   ?Dorris Carnes, MD   ? ? ?Other Instructions ? ?REFERRAL TO HTN CLINIC IN ONE MONTH   ?

## 2021-11-14 LAB — CBC
Hematocrit: 37.3 % (ref 34.0–46.6)
Hemoglobin: 12 g/dL (ref 11.1–15.9)
MCH: 28.6 pg (ref 26.6–33.0)
MCHC: 32.2 g/dL (ref 31.5–35.7)
MCV: 89 fL (ref 79–97)
Platelets: 186 10*3/uL (ref 150–450)
RBC: 4.19 x10E6/uL (ref 3.77–5.28)
RDW: 13.1 % (ref 11.7–15.4)
WBC: 7.5 10*3/uL (ref 3.4–10.8)

## 2021-11-14 LAB — BASIC METABOLIC PANEL
BUN/Creatinine Ratio: 14 (ref 12–28)
BUN: 11 mg/dL (ref 8–27)
CO2: 28 mmol/L (ref 20–29)
Calcium: 9.8 mg/dL (ref 8.7–10.3)
Chloride: 106 mmol/L (ref 96–106)
Creatinine, Ser: 0.8 mg/dL (ref 0.57–1.00)
Glucose: 99 mg/dL (ref 70–99)
Potassium: 4.3 mmol/L (ref 3.5–5.2)
Sodium: 149 mmol/L — ABNORMAL HIGH (ref 134–144)
eGFR: 80 mL/min/{1.73_m2} (ref >59)

## 2021-11-14 LAB — LIPID PANEL
Chol/HDL Ratio: 3.5 ratio (ref 0.0–4.4)
Cholesterol, Total: 135 mg/dL (ref 100–199)
HDL: 39 mg/dL — ABNORMAL LOW (ref >39)
LDL Chol Calc (NIH): 78 mg/dL (ref 0–99)
Triglycerides: 97 mg/dL (ref 0–149)
VLDL Cholesterol Cal: 18 mg/dL (ref 5–40)

## 2021-11-15 ENCOUNTER — Telehealth: Payer: Self-pay

## 2021-11-15 NOTE — Telephone Encounter (Signed)
Received request from [facility] for the following Rx. I did not see this on pt med list. ? ?Sertraline HCL ? ?Please send Rx if appropriate. ? ?Ottis Stain, CMA ? ? ?

## 2021-11-16 NOTE — Telephone Encounter (Signed)
I wish they would quit sendong that. I have made myself a sticky note that she is NOT on that anymore. Next time, if it comes via fax, and if we can remember, save it and I will send them a letter back. THANKS! ?[name] ? ?

## 2021-11-20 ENCOUNTER — Telehealth: Payer: Self-pay | Admitting: Internal Medicine

## 2021-11-20 DIAGNOSIS — I10 Essential (primary) hypertension: Secondary | ICD-10-CM

## 2021-11-20 DIAGNOSIS — Z79899 Other long term (current) drug therapy: Secondary | ICD-10-CM

## 2021-11-20 DIAGNOSIS — E870 Hyperosmolality and hypernatremia: Secondary | ICD-10-CM

## 2021-11-20 NOTE — Telephone Encounter (Signed)
Pt advised her lab results and she will call if she can get a ride sooner but if not will be having her repeat BMET at her Pharm D appt 12/20/21. ?

## 2021-11-20 NOTE — Telephone Encounter (Signed)
    Patient calling for lab results 

## 2021-11-21 DIAGNOSIS — H905 Unspecified sensorineural hearing loss: Secondary | ICD-10-CM | POA: Diagnosis not present

## 2021-11-22 NOTE — Progress Notes (Signed)
? Patient ID: [name] [name], female    DOB: 1951-02-12, 71 y.o.   MRN: [id number] ? ? Brief patient profile:  ? 24 yobf never regular smoker quit in the 1977   followed for asthma, sleep apnea, complicated by hx depression, vocal cord paralysis, GERD. ?NPSG 07/19/10  AHI 7.3/ hr, desaturation to 83%, body weight 186 lbs ?PFT 07/19/2010-moderate obstructive airways disease, insignificant response to bronchodilator, FEV1/FVC 0.65, TLC 63%, DLCO 71% ?office spirometry ([facility])  09/09/2018-reviewed. ?FVC 1.75/86%, FEV1 1.33/84%, ratio 0.76, FEF 25-75% 1.22/78% ?-------------------------------------------------------------------------------------- ? ?  ?11/16/20- 71 year old female former occasional smoker followed for asthma/COPD, OSA, complicated by history depression, vocal cord paralysis, GERD, obesity, DM 2, glaucoma, Covid infection 2021,  ?CPAP auto 5-15/[facility] ? Singulair, Astelin, albuterol hfa, Symbicort 160 ?Download-none ?Body weight today-179 lbs ?Covid vax-3 Phizer ?Flu vax-had ?Reports doing very well, sleeping well with CPAP- used "almost every night". ?Breathing comfortably with no exacerbations. Daily Symbicort, occasional rescue hfa with no night-time asthma. ? ?11/23/21-  71 year old female former occasional smoker followed for asthma/COPD, OSA, complicated by history Depression, Vocal Cord Paralysis, GERD, Obesity, DM 2, Glaucoma, Covid infection 2021, HTN,  ?CPAP auto 4-15/[facility] ? -Singulair, Astelin, albuterol hfa, Symbicort 160 ?Download compliance 80%, AHI 1.5/hr ?Body weight today- ?Covid vax-5 Phizer ?Flu vax-had ?-----Patient is sleeping good, feels like breathing is good. No concerns ?Using Symbicort daily and averaging rescue inhaler once every week or 2.  No acute events or concerns.  She feels she is very stable place in her life. ?Download reviewed.  She is comfortable with CPAP.  Admits she leaves it off once in a while to see how it will feel without, but she is meeting goals.   Machine is working well mechanically. ? ?ROS-see HPI   + = positive ?Constitutional:    weight loss, night sweats, fevers, chills, fatigue, lassitude. ?HEENT:    headaches, difficulty swallowing, tooth/dental problems, sore throat,  ?     sneezing, itching, ear ache, nasal congestion, post nasal drip, snoring ?CV:    chest pain, orthopnea, PND, swelling in lower extremities, anasarca,                                  ?                         dizziness, palpitations ?Resp:   shortness of breath with exertion or at rest.   ?             productive cough,   non-productive cough, coughing up of blood.   ?           change in color of mucus.  wheezing.   ?Skin:    rash or lesions. ?GI:  No-   heartburn, indigestion, abdominal pain, nausea, vomiting, diarrhea,  ?               change in bowel habits, loss of appetite ?GU: dysuria, change in color of urine, no urgency or frequency.   flank pain. ?MS:   joint pain, stiffness, decreased range of motion, back pain. ?Neuro-     nothing unusual ?Psych:  change in mood or affect.  depression or anxiety.   memory loss. ? ?OBJ- Physical Exam ?General- Alert, Oriented, Affect-appropriate, Distress- none acute, + obese ?Skin- rash-none, lesions- none, excoriation- none ?Lymphadenopathy- none ?Head- atraumatic ?  Eyes- + impaired/ thick glasses,  ?           Ears- Hearing, canals-normal ?           Nose- Clear, no-Septal dev, mucus, polyps, erosion, perforation  ?           Throat- Mallampati II , mucosa clear , drainage- none, tonsils- atrophic ?Neck- flexible , trachea midline, no stridor , thyroid nl, carotid no bruit ?Chest - symmetrical excursion , unlabored ?          Heart/CV- RRR , no murmur , no gallop  , no rub, nl s1 s2 ?                          - JVD- none , edema- none, stasis changes- none, varices- none ?          Lung- clear to P&A, wheeze- none, cough- none , dullness-none, rub- none ?          Chest wall-  ?Abd-  ?Br/ Gen/ Rectal- Not done, not  indicated ?Extrem- + rolling walker ?Neuro- grossly intact to observation ? ? ? ?

## 2021-11-23 ENCOUNTER — Other Ambulatory Visit: Payer: Self-pay

## 2021-11-23 ENCOUNTER — Ambulatory Visit (INDEPENDENT_AMBULATORY_CARE_PROVIDER_SITE_OTHER): Payer: Medicare Other | Admitting: Internal Medicine

## 2021-11-23 ENCOUNTER — Encounter: Payer: Self-pay | Admitting: Internal Medicine

## 2021-11-23 DIAGNOSIS — J4540 Moderate persistent asthma, uncomplicated: Secondary | ICD-10-CM | POA: Diagnosis not present

## 2021-11-23 DIAGNOSIS — G4733 Obstructive sleep apnea (adult) (pediatric): Secondary | ICD-10-CM

## 2021-11-23 NOTE — Assessment & Plan Note (Signed)
I encouraged her not to let her compliance use slip, but she is doing well with CPAP overall. ?Plan-continue auto 4-12 ?

## 2021-11-23 NOTE — Assessment & Plan Note (Signed)
Feels well controlled and chest is quiet today with only occasional need for rescue inhaler and no sleep disturbance by asthma. ?Plan-continue current meds. ?

## 2021-11-23 NOTE — Patient Instructions (Signed)
We can continue current breathing meds. Let us know if you need refills. ? ?We can continue CPAP auto 5-15 ? ?Please call if we can help ?

## 2021-11-28 ENCOUNTER — Other Ambulatory Visit: Payer: Self-pay | Admitting: Family Medicine

## 2021-12-11 ENCOUNTER — Other Ambulatory Visit: Payer: Self-pay | Admitting: Family Medicine

## 2021-12-12 ENCOUNTER — Ambulatory Visit: Payer: Medicare Other | Admitting: Family Medicine

## 2021-12-12 ENCOUNTER — Ambulatory Visit (INDEPENDENT_AMBULATORY_CARE_PROVIDER_SITE_OTHER): Payer: Medicare Other | Admitting: Podiatry

## 2021-12-12 ENCOUNTER — Encounter: Payer: Self-pay | Admitting: Podiatry

## 2021-12-12 DIAGNOSIS — B351 Tinea unguium: Secondary | ICD-10-CM | POA: Diagnosis not present

## 2021-12-12 DIAGNOSIS — E1142 Type 2 diabetes mellitus with diabetic polyneuropathy: Secondary | ICD-10-CM | POA: Diagnosis not present

## 2021-12-12 DIAGNOSIS — Q828 Other specified congenital malformations of skin: Secondary | ICD-10-CM

## 2021-12-12 DIAGNOSIS — L84 Corns and callosities: Secondary | ICD-10-CM

## 2021-12-12 DIAGNOSIS — K3184 Gastroparesis: Secondary | ICD-10-CM | POA: Insufficient documentation

## 2021-12-12 DIAGNOSIS — K760 Fatty (change of) liver, not elsewhere classified: Secondary | ICD-10-CM | POA: Insufficient documentation

## 2021-12-12 DIAGNOSIS — Z86010 Personal history of colonic polyps: Secondary | ICD-10-CM | POA: Insufficient documentation

## 2021-12-12 DIAGNOSIS — M79674 Pain in right toe(s): Secondary | ICD-10-CM | POA: Diagnosis not present

## 2021-12-12 DIAGNOSIS — R141 Gas pain: Secondary | ICD-10-CM | POA: Insufficient documentation

## 2021-12-12 DIAGNOSIS — M79675 Pain in left toe(s): Secondary | ICD-10-CM | POA: Diagnosis not present

## 2021-12-19 ENCOUNTER — Ambulatory Visit (INDEPENDENT_AMBULATORY_CARE_PROVIDER_SITE_OTHER): Payer: Medicare Other | Admitting: Family Medicine

## 2021-12-19 ENCOUNTER — Encounter: Payer: Self-pay | Admitting: Family Medicine

## 2021-12-19 VITALS — BP 132/80 | HR 70 | Wt 180.8 lb

## 2021-12-19 DIAGNOSIS — B029 Zoster without complications: Secondary | ICD-10-CM | POA: Diagnosis not present

## 2021-12-19 DIAGNOSIS — E1149 Type 2 diabetes mellitus with other diabetic neurological complication: Secondary | ICD-10-CM | POA: Diagnosis not present

## 2021-12-19 MED ORDER — FAMCICLOVIR 500 MG PO TABS
500.0000 mg | ORAL_TABLET | Freq: Three times a day (TID) | ORAL | 0 refills | Status: DC
Start: 2021-12-19 — End: 2023-02-21

## 2021-12-19 MED ORDER — INSULIN LISPRO (1 UNIT DIAL) 100 UNIT/ML (KWIKPEN): 20.0000 [IU] | PEN_INJECTOR | Freq: Every day | SUBCUTANEOUS | 3 refills | Status: DC

## 2021-12-19 MED ORDER — LANTUS SOLOSTAR 100 UNIT/ML ~~LOC~~ SOPN: PEN_INJECTOR | SUBCUTANEOUS | 3 refills | Status: DC

## 2021-12-19 NOTE — Progress Notes (Signed)
? ? ?  CHIEF COMPLAINT / HPI: ? F/u med rec ?At last ov she was not clear about a couple of her meds so I asked her to come by and bring all meds ?2. Additionally she had acute onset day before yesterday of left arm tingling and pain around the anterior elbow. It came and went and then yesterday she broke out in a rash. She  ? ? ?PERTINENT  PMH / PSH: I have reviewed the patient?s medications, allergies, past medical and surgical history, smoking status and updated in the EMR as appropriate. ? ? ?OBJECTIVE: ? BP 132/80   Pulse 70   Wt 180 lb 12.8 oz (82 kg)   SpO2 98%   BMI 34.16 kg/m?  ?SKIN: lonear hyperpigmentation of left arm ;see photo in media file ? ?ASSESSMENT / PLAN: ?Herpes zoster ?Treat with famciclovir ?3 med reconciliatiojn done ? ?No problem-specific Assessment & Plan notes found for this encounter. ?  ?[name] MD ?

## 2021-12-19 NOTE — Patient Instructions (Signed)
I am treating you for shingles even though I am not sure that is what this is. For one week, pick up he famciclovir and take one three times a day. Do not take the acyclovir while you are on the famciclovir. Restart the acyclovir after you finish the famciclovir. ? ?Thanks for bringing your meds in. I have updated your list ? ?

## 2021-12-19 NOTE — Progress Notes (Deleted)
Patient ID: [name]                 DOB: 10/20/1950                      MRN: [id number] ? ? ? ? ?HPI: ?[name] [name] is a 71 y.o. female referred by Dr. Harrington Challenger to HTN clinic. PMH is significant for HTN, HLD, T2DM, paroxysmal atrial tachycardia, and aortic sclerosis. Pt saw Dr. Harrington Challenger on 11/13/21 for HTN f/u and BP was elevated to 140/86. Pt said she checks her BP at home and it ranges in the 150s. ? ?Losartan? Pt said she is on it but it has not been prescribed since 2018 ? ?Home BP readings:  ? ?Current HTN meds: spironolactone 25 mg daily, metoprolol succinate 50 mg daily ?Previously tried: amlodipine (edema), lisinopril (cough), valsartan (slept more the day after she took it and edema), HCTZ ? ?BP goal: <130/80 mmHg ? ?Family History: The patient's family history includes Congestive Heart Failure in her brother; Diabetes in her father; Heart disease in her mother; Hyperlipidemia in her brother and sister; Hypertension in her brother, sister, and sister.  ? ?Social History: The patient  reports that she quit smoking about 46 years ago. Her smoking use included cigarettes. She has a 0.25 pack-year smoking history. She has never used smokeless tobacco. She reports that she does not drink alcohol and does not use drugs.  ? ?Diet:  ? ?Exercise:  ? ?Wt Readings from Last 3 Encounters:  ?12/19/21 180 lb 12.8 oz (82 kg)  ?11/23/21 177 lb 12.8 oz (80.6 kg)  ?11/13/21 179 lb (81.2 kg)  ? ?BP Readings from Last 3 Encounters:  ?12/19/21 132/80  ?11/23/21 130/88  ?11/13/21 140/86  ? ?Pulse Readings from Last 3 Encounters:  ?12/19/21 70  ?11/23/21 75  ?11/13/21 71  ? ? ?Renal function: ?CrCl cannot be calculated (Patient's most recent lab result is older than the maximum 21 days allowed.). ? ?Past Medical History:  ?Diagnosis Date  ? Allergy   ? Angina   ? Anxiety   ? Arthritis   ? Asthma   ? Blood transfusion   ? Blood transfusion without reported diagnosis   ? Cataract   ? Chronic cough   ? Now followed  by pulmonary  ? Chronic lower back pain   ? Complication of anesthesia   ? "just wake up coughing; that's all"  ? Concussion 11/18/11  ? "fell at dr's office; hit head"  ? COPD (chronic obstructive pulmonary disease) ([place])   ? Delayed gastric emptying   ? Depression   ? Diabetes mellitus type II   ? "take insulin & pills"  ? Dyslipidemia   ? Exertional dyspnea   ? GERD (gastroesophageal reflux disease)   ? Glaucoma   ? Gout   ? Headache(784.0) 11/18/11  ? "I've had mild headaches the last couple days"  ? Headache(784.0) 01/08/12  ? "pretty constant since 11/18/11's concussion"  ? Heart murmur   ? History of colonoscopy   ? HTN (hypertension)   ? Myocardial infarction [facility])   ? Osteoporosis   ? Peripheral neuropathy   ? Pneumonia   ? "i've had it once" (11/18/11)  ? Sleep apnea   ? on CPAP 12  ? Syncope 06/16/2012  ? Vocal cord paralysis, unilateral partial   ? "right"  ? ? ?Current Outpatient Medications on File Prior to Visit  ?Medication Sig Dispense Refill  ? insulin lispro (HUMALOG KWIKPEN) 100  UNIT/ML KwikPen INJECT SUBCUTANEOUSLY 35  UNITS TWICE DAILY AFTER  MEALS AS DIRECTED 225 mL 3  ? Accu-Chek FastClix Lancets MISC Use to check blood sugars 3x per day. E11.9 306 each 12  ? albuterol (VENTOLIN HFA) 108 (90 Base) MCG/ACT inhaler Inhale 2 puffs into the lungs every 4 (four) hours as needed for wheezing or shortness of breath (coughing fits). 18 g 1  ? Alcohol Swabs (B-D SINGLE USE SWABS REGULAR) PADS USE THREE TIMES DAILY 300 each 3  ? allopurinol (ZYLOPRIM) 300 MG tablet TAKE 1 TABLET BY MOUTH  DAILY 90 tablet 3  ? ammonium lactate (LAC-HYDRIN) 12 % lotion APPLY 1 APPLICATION TOPICALLY TWICE DAILY AS NEEDED FOR DRY SKIN (SUBSTITUTED FOR LAC HYDRIN) 400 g 12  ? aspirin EC 81 MG tablet Take 81 mg by mouth daily.    ? atorvastatin (LIPITOR) 40 MG tablet TAKE 1 TABLET BY MOUTH  DAILY 90 tablet 3  ? B-D ULTRAFINE III SHORT PEN 31G X 8 MM MISC USE TO INJECT THREE TIMES A DAY 90 each 3  ? Blood Glucose Monitoring Suppl  (ACCU-CHEK GUIDE) w/Device KIT Use to check blood sugars 3x per day. E11.9 1 kit 0  ? Brimonidine Tartrate-Timolol (COMBIGAN OP) Combigan    ? budesonide-formoterol (SYMBICORT) 160-4.5 MCG/ACT inhaler Inhale 2 puffs into the lungs in the morning and at bedtime. with spacer and rinse mouth afterwards. 30.6 g 2  ? buPROPion (WELLBUTRIN) 100 MG tablet Take 1 tablet (100 mg total) by mouth 2 (two) times daily. 180 tablet 0  ? COMBIGAN 0.2-0.5 % ophthalmic solution INSTILL 1 DROP INTO THE  LEFT EYE EVERY 12 HOURS 5 mL 12  ? Dulaglutide (TRULICITY) 1.5 ZJ/6.9CV SOPN INJECT THE CONTENTS OF ONE  PEN SUBCUTANEOUSLY WEEKLY  AS DIRECTED 6 mL 3  ? DULoxetine (CYMBALTA) 60 MG capsule Take 1 capsule (60 mg total) by mouth daily. 90 capsule 0  ? ezetimibe (ZETIA) 10 MG tablet TAKE 1 TABLET BY MOUTH  DAILY 90 tablet 3  ? famciclovir (FAMVIR) 500 MG tablet Take 1 tablet (500 mg total) by mouth 3 (three) times daily. 21 tablet 0  ? fluticasone (FLONASE) 50 MCG/ACT nasal spray Place 1 spray into both nostrils in the morning and at bedtime. 48 g 2  ? furosemide (LASIX) 40 MG tablet Take one tablet by mouth daily 90 tablet 3  ? glucose blood (ACCU-CHEK GUIDE) test strip Use to check blood sugars 3x per day. E11.9 100 each 12  ? Insulin Syringe-Needle U-100 (BD INSULIN SYRINGE ULTRAFINE) 31G X 15/64" 0.5 ML MISC Use to inject three times a day 300 each 2  ? LANTUS SOLOSTAR 100 UNIT/ML Solostar Pen INJECT SUBCUTANEOUSLY 35  UNITS TWICE DAILY 75 mL 3  ? latanoprost (XALATAN) 0.005 % ophthalmic solution Place 1 drop into both eyes at bedtime. 2.5 mL 3  ? levocetirizine (XYZAL) 5 MG tablet Take 1 tablet (5 mg total) by mouth every evening. 30 tablet 0  ? LINZESS 290 MCG CAPS capsule Take 1 capsule (290 mcg total) by mouth daily as needed. 30 capsule   ? metoCLOPramide (REGLAN) 10 MG tablet 1/2 tab(s) orally 4 times a day (before meals and at bedtime) for 30 day(s)    ? metoprolol succinate (TOPROL-XL) 50 MG 24 hr tablet Take one tablet at  bedtime for blood pressure 270 tablet 3  ? mometasone (ELOCON) 0.1 % cream Apply to affected area daily as needed do not use on face 45 g 5  ? montelukast (SINGULAIR) 10  MG tablet Take 1 tablet (10 mg total) by mouth daily. 90 tablet 2  ? Multiple Vitamins-Minerals (MULTIVITAMIN PO) Take 1 tablet by mouth daily.    ? RESTASIS MULTIDOSE 0.05 % ophthalmic emulsion Place 1 drop into both eyes daily as needed.  3  ? spironolactone (ALDACTONE) 25 MG tablet TAKE 1 TABLET BY MOUTH  DAILY BEFORE SUPPER 90 tablet 3  ? ?No current facility-administered medications on file prior to visit.  ? ?Allergies  ?Allergen Reactions  ? Bee Venom Hives and Swelling  ? Propoxyphene Hcl Itching  ? Amlodipine Besylate Swelling  ? Hydrocodone Other (See Comments)  ?  With Vicodin - makes patient "jittery", and "hangover effect - sleepy next day"  ? Lisinopril Cough  ?  Changed to ARB  ? Metformin And Related Diarrhea  ?  GI distress  ? Metronidazole Other (See Comments)  ?  REACTION: "just didn't work; my body never did heal from it"  ? Valsartan Other (See Comments)  ?  REACTION:  "sleep more the next day after I took it; it made me real tired"  ? Propoxyphene   ?  Other reaction(s): Unknown  ? Sulfasalazine   ?  Other reaction(s): Unknown  ? Xyzal [Levocetirizine Dihydrochloride] Rash  ? ?Assessment/Plan: ? ?1. Hypertension -  ? ?Patient seen with Debria Garret, [place] pharmacy student ? ?

## 2021-12-20 ENCOUNTER — Other Ambulatory Visit: Payer: Medicare Other

## 2021-12-20 ENCOUNTER — Ambulatory Visit (INDEPENDENT_AMBULATORY_CARE_PROVIDER_SITE_OTHER): Payer: Medicare Other

## 2021-12-20 VITALS — BP 160/72

## 2021-12-20 DIAGNOSIS — E870 Hyperosmolality and hypernatremia: Secondary | ICD-10-CM | POA: Diagnosis not present

## 2021-12-20 DIAGNOSIS — Z79899 Other long term (current) drug therapy: Secondary | ICD-10-CM | POA: Diagnosis not present

## 2021-12-20 DIAGNOSIS — E785 Hyperlipidemia, unspecified: Secondary | ICD-10-CM

## 2021-12-20 DIAGNOSIS — I10 Essential (primary) hypertension: Secondary | ICD-10-CM

## 2021-12-20 MED ORDER — IRBESARTAN 150 MG PO TABS: 150.0000 mg | ORAL_TABLET | Freq: Every day | ORAL | 3 refills | Status: DC

## 2021-12-20 NOTE — Progress Notes (Signed)
Patient ID: [name] [name]                 DOB: 30-Apr-1951                    MRN: [id number] ? ? ? ? ?HPI: ?[name] [name] is a 71 y.o. female patient referred to lipid clinic by Dr. Harrington Challenger. PMH is significant for HTN, HLD, T2DM (A1c 6.3%), GERD, gout, and OSA on CPAP. Patient followed by Dr. Harrington Challenger for HTN and HLD since 2013. Has been taking atorvastatin 40 mg daily and ezetimibe 10 mg daily since Jan 2018. ? ?Patient presents to PharmD lipid clinic today. Endorses adherence to lipid medications and tolerates well. Denies joint pain and muscle aches. Has lost some weight by eating one meal a day. Has a hard time getting up stairs and doesn't walk anymore. Likes to eat a hot dog and french fries as her evening "snack" or meal although states she doesn't do this as often anymore.  ? ?Patient also brought home cuff and asked to validate it with clinic cuff. Usually takes BP medicines at night. Home cuff (160/70) consistent with office cuff (162/64, 160/72). Doesn't forget any BP medicines. Remembers taking losartan in the past with no problems, but got sleepy with valsartan and doesn't want to retry. ? ?Current Medications:  ?Atorvastatin 40 mg daily ?Zetia 10 mg daily ? ?Intolerances: none  ?Risk Factors: HTN, DM, OSA ?LDL goal: <70 mg/dL ? ?Diet: lost weight by eating less/one meal a day ?Coffee with a lot of cream and sugar ?Lunch (1-2p): chicken, green beans/cabbage, collard greens & rice or mac n cheese ?Doesn't eat dinner unless she's out and gets a hot dog and french fries ~once a week ?Doesn't eat anything after 6pm ? ?Exercise: ambulates with a walker when traveling. Has trouble getting up the stairs. Used to go on walks but has history of falls  ? ?Family History: Cancer in her sister; Congestive Heart Failure in her brother; Diabetes in her father; Heart disease in her mother; Hyperlipidemia in her brother and sister; Hypertension in her brother, sister, and sister.  ? ?Social History: Prior  tobacco use (0.25 pack-year history, quit 1977). Denies alcohol or illicit drug use. ? ?Labs: ?11/13/2021: TC 135, TG 97, HDL 39, LDL 78 (atorvastatin 40 mg, ezetimibe 10 mg) ?11/12/2019: TC 118, TG 103, HDL 36, LDL 63 (atorvastatin 40 mg, ezetimibe 10 mg) ?11/27/2016: TC 121, TG 99, HDL 28, LDL 73 (atorvastatin 40 mg, ezetimibe 10 mg) ? ? ?Past Medical History:  ?Diagnosis Date  ? Allergy   ? Angina   ? Anxiety   ? Arthritis   ? Asthma   ? Blood transfusion   ? Blood transfusion without reported diagnosis   ? Cataract   ? Chronic cough   ? Now followed by pulmonary  ? Chronic lower back pain   ? Complication of anesthesia   ? "just wake up coughing; that's all"  ? Concussion 11/18/11  ? "fell at dr's office; hit head"  ? COPD (chronic obstructive pulmonary disease) ([place])   ? Delayed gastric emptying   ? Depression   ? Diabetes mellitus type II   ? "take insulin & pills"  ? Dyslipidemia   ? Exertional dyspnea   ? GERD (gastroesophageal reflux disease)   ? Glaucoma   ? Gout   ? Headache(784.0) 11/18/11  ? "I've had mild headaches the last couple days"  ? Headache(784.0) 01/08/12  ? "pretty constant since 11/18/11's  concussion"  ? Heart murmur   ? History of colonoscopy   ? HTN (hypertension)   ? Myocardial infarction [facility])   ? Osteoporosis   ? Peripheral neuropathy   ? Pneumonia   ? "i've had it once" (11/18/11)  ? Sleep apnea   ? on CPAP 12  ? Syncope 06/16/2012  ? Vocal cord paralysis, unilateral partial   ? "right"  ? ? ?Current Outpatient Medications on File Prior to Visit  ?Medication Sig Dispense Refill  ? Accu-Chek FastClix Lancets MISC Use to check blood sugars 3x per day. E11.9 306 each 12  ? albuterol (VENTOLIN HFA) 108 (90 Base) MCG/ACT inhaler Inhale 2 puffs into the lungs every 4 (four) hours as needed for wheezing or shortness of breath (coughing fits). 18 g 1  ? Alcohol Swabs (B-D SINGLE USE SWABS REGULAR) PADS USE THREE TIMES DAILY 300 each 3  ? allopurinol (ZYLOPRIM) 300 MG tablet TAKE 1 TABLET BY MOUTH   DAILY 90 tablet 3  ? ammonium lactate (LAC-HYDRIN) 12 % lotion APPLY 1 APPLICATION TOPICALLY TWICE DAILY AS NEEDED FOR DRY SKIN (SUBSTITUTED FOR LAC HYDRIN) 400 g 12  ? aspirin EC 81 MG tablet Take 81 mg by mouth daily.    ? atorvastatin (LIPITOR) 40 MG tablet TAKE 1 TABLET BY MOUTH  DAILY 90 tablet 3  ? B-D ULTRAFINE III SHORT PEN 31G X 8 MM MISC USE TO INJECT THREE TIMES A DAY 90 each 3  ? Blood Glucose Monitoring Suppl (ACCU-CHEK GUIDE) w/Device KIT Use to check blood sugars 3x per day. E11.9 1 kit 0  ? Brimonidine Tartrate-Timolol (COMBIGAN OP) Combigan    ? budesonide-formoterol (SYMBICORT) 160-4.5 MCG/ACT inhaler Inhale 2 puffs into the lungs in the morning and at bedtime. with spacer and rinse mouth afterwards. 30.6 g 2  ? buPROPion (WELLBUTRIN) 100 MG tablet Take 1 tablet (100 mg total) by mouth 2 (two) times daily. 180 tablet 0  ? COMBIGAN 0.2-0.5 % ophthalmic solution INSTILL 1 DROP INTO THE  LEFT EYE EVERY 12 HOURS 5 mL 12  ? Dulaglutide (TRULICITY) 1.5 HU/7.6LY SOPN INJECT THE CONTENTS OF ONE  PEN SUBCUTANEOUSLY WEEKLY  AS DIRECTED 6 mL 3  ? DULoxetine (CYMBALTA) 60 MG capsule Take 1 capsule (60 mg total) by mouth daily. 90 capsule 0  ? ezetimibe (ZETIA) 10 MG tablet TAKE 1 TABLET BY MOUTH  DAILY 90 tablet 3  ? famciclovir (FAMVIR) 500 MG tablet Take 1 tablet (500 mg total) by mouth 3 (three) times daily. 21 tablet 0  ? fluticasone (FLONASE) 50 MCG/ACT nasal spray Place 1 spray into both nostrils in the morning and at bedtime. 48 g 2  ? furosemide (LASIX) 40 MG tablet Take one tablet by mouth daily 90 tablet 3  ? glucose blood (ACCU-CHEK GUIDE) test strip Use to check blood sugars 3x per day. E11.9 100 each 12  ? insulin glargine (LANTUS SOLOSTAR) 100 UNIT/ML Solostar Pen Inject 35 units subcutaneously once daily as directed 75 mL 3  ? insulin lispro (HUMALOG KWIKPEN) 100 UNIT/ML KwikPen Inject 20 Units into the skin daily after supper. 225 mL 3  ? Insulin Syringe-Needle U-100 (BD INSULIN SYRINGE  ULTRAFINE) 31G X 15/64" 0.5 ML MISC Use to inject three times a day 300 each 2  ? latanoprost (XALATAN) 0.005 % ophthalmic solution Place 1 drop into both eyes at bedtime. 2.5 mL 3  ? levocetirizine (XYZAL) 5 MG tablet Take 1 tablet (5 mg total) by mouth every evening. 30 tablet 0  ?  LINZESS 290 MCG CAPS capsule Take 1 capsule (290 mcg total) by mouth daily as needed. 30 capsule   ? metoCLOPramide (REGLAN) 10 MG tablet 1/2 tab(s) orally 4 times a day (before meals and at bedtime) for 30 day(s)    ? metoprolol succinate (TOPROL-XL) 50 MG 24 hr tablet Take one tablet at bedtime for blood pressure 270 tablet 3  ? mometasone (ELOCON) 0.1 % cream Apply to affected area daily as needed do not use on face 45 g 5  ? montelukast (SINGULAIR) 10 MG tablet Take 1 tablet (10 mg total) by mouth daily. 90 tablet 2  ? Multiple Vitamins-Minerals (MULTIVITAMIN PO) Take 1 tablet by mouth daily.    ? RESTASIS MULTIDOSE 0.05 % ophthalmic emulsion Place 1 drop into both eyes daily as needed.  3  ? spironolactone (ALDACTONE) 25 MG tablet TAKE 1 TABLET BY MOUTH  DAILY BEFORE SUPPER 90 tablet 3  ? ?No current facility-administered medications on file prior to visit.  ? ? ?Allergies  ?Allergen Reactions  ? Bee Venom Hives and Swelling  ? Propoxyphene Hcl Itching  ? Amlodipine Besylate Swelling  ? Hydrocodone Other (See Comments)  ?  With Vicodin - makes patient "jittery", and "hangover effect - sleepy next day"  ? Lisinopril Cough  ?  Changed to ARB  ? Metformin And Related Diarrhea  ?  GI distress  ? Metronidazole Other (See Comments)  ?  REACTION: "just didn't work; my body never did heal from it"  ? Valsartan Other (See Comments)  ?  REACTION:  "sleep more the next day after I took it; it made me real tired"  ? Propoxyphene   ?  Other reaction(s): Unknown  ? Sulfasalazine   ?  Other reaction(s): Unknown  ? Xyzal [Levocetirizine Dihydrochloride] Rash  ? ? ?Assessment/Plan: ? ?1. Hyperlipidemia - LDL above goal of <70 mg/dL on atorvastatin  40 mg and ezetimibe 10 mg daily. Encouraged patient to limit hot dog/french fries to once a month and consider adding more healthy snacks like unsalted nuts or carrots. Exercise limited by falls and balance. [place] Din

## 2021-12-20 NOTE — Patient Instructions (Addendum)
STOP taking Zetia 10 mg daily. ? ?We will call you insurance about getting approval for the PCSK9 inhibitor called Repatha.  ? ?START taking irbesartan 150 mg once daily.  ? ?Please try to limit your french fries and hot dogs to once a month or every two weeks. Try to get some exercise in as well.  ?

## 2021-12-21 ENCOUNTER — Telehealth: Payer: Self-pay | Admitting: Internal Medicine

## 2021-12-21 LAB — BASIC METABOLIC PANEL
BUN/Creatinine Ratio: 12 (ref 12–28)
BUN: 11 mg/dL (ref 8–27)
CO2: 24 mmol/L (ref 20–29)
Calcium: 9.4 mg/dL (ref 8.7–10.3)
Chloride: 103 mmol/L (ref 96–106)
Creatinine, Ser: 0.93 mg/dL (ref 0.57–1.00)
Glucose: 134 mg/dL — ABNORMAL HIGH (ref 70–99)
Potassium: 3.8 mmol/L (ref 3.5–5.2)
Sodium: 144 mmol/L (ref 134–144)
eGFR: 67 mL/min/{1.73_m2} (ref >59)

## 2021-12-21 MED ORDER — REPATHA SURECLICK 140 MG/ML ~~LOC~~ SOAJ: 140.0000 mg | SUBCUTANEOUS | 11 refills | Status: DC

## 2021-12-21 NOTE — Telephone Encounter (Signed)
? ?  Pt requesting to speak with pharmD Melissa regarding her visit yesterday  ?

## 2021-12-21 NOTE — Telephone Encounter (Addendum)
Repatha approved through 06/22/2022. Sent prescription to [facility]. Spoke with patient and informed her that prescription is ready to be picked up.  ?

## 2021-12-21 NOTE — Progress Notes (Signed)
?Subjective:  ?Patient ID: [name] [name], female    DOB: 1951-08-06,  MRN: [id number] ? ?[name] [name] presents to clinic today for at risk foot care with history of diabetic neuropathy and callus(es) b/l feet, porokeratosis left foot and painful thick toenails that are difficult to trim. Painful toenails interfere with ambulation. Aggravating factors include wearing enclosed shoe gear. Pain is relieved with periodic professional debridement. Painful calluses and porokeratosis are aggravated when weightbearing with and without shoegear. Pain is relieved with periodic professional debridement. ? ?Last HgA1c was 6.3%. Patient did not check blood glucose today. ? ?New problem(s): None.  ? ?PCP is Dickie La, MD , and last visit was November 07, 2021. ? ?Allergies  ?Allergen Reactions  ? Bee Venom Hives and Swelling  ? Propoxyphene Hcl Itching  ? Amlodipine Besylate Swelling  ? Hydrocodone Other (See Comments)  ?  With Vicodin - makes patient "jittery", and "hangover effect - sleepy next day"  ? Lisinopril Cough  ?  Changed to ARB  ? Metformin And Related Diarrhea  ?  GI distress  ? Metronidazole Other (See Comments)  ?  REACTION: "just didn't work; my body never did heal from it"  ? Valsartan Other (See Comments)  ?  REACTION:  "sleep more the next day after I took it; it made me real tired"  ? Propoxyphene   ?  Other reaction(s): Unknown  ? Sulfasalazine   ?  Other reaction(s): Unknown  ? Xyzal [Levocetirizine Dihydrochloride] Rash  ? ? ?Review of Systems: Negative except as noted in the HPI. ? ?Objective: No changes noted in today's physical examination. ? ?Constitutional [name] [name] is a pleasant 71 y.o. African American female, obese in NAD. AAO x 3.   ?Vascular CFT immediate b/l LE. Palpable DP/PT pulses b/l LE. Digital hair absent b/l. Skin temperature gradient WNL b/l. No pain with calf compression b/l. Trace edema noted b/l. No cyanosis or clubbing noted b/l LE.  ?Neurologic Normal  speech. Oriented to person, place, and time. Pt has subjective symptoms of neuropathy. Protective sensation intact 5/5 intact bilaterally with 10g monofilament b/l. Vibratory sensation intact b/l.  ?Dermatologic Pedal integument with normal turgor, texture and tone b/l LE. No open wounds b/l. No interdigital macerations b/l. Toenails 1-5 b/l elongated, thickened, discolored with subungual debris. +Tenderness with dorsal palpation of nailplates. Hyperkeratotic lesion(s) noted bilateral heels, submet head 1 right foot, and submet head 4 left foot.  ?Orthopedic: Muscle strength 5/5 to all lower extremity muscle groups bilaterally. Hammertoe deformity noted 2-5 b/l. Pes planus deformity noted bilateral LE. Utilizes rollator for ambulation assistance. Wearing diabetic shoes on today's visit.  ? ?Radiographs: None ? ?  Latest Ref Rng & Units 11/07/2021  ? 10:37 AM 08/01/2021  ? 10:56 AM 04/18/2021  ? 11:06 AM 01/10/2021  ? 11:52 AM  ?Hemoglobin A1C  ?Hemoglobin-A1c 0.0 - 7.0 % 6.3   6.5   6.6   6.6    ? ?Assessment/Plan: ?1. Pain due to onychomycosis of toenails of both feet   ?2. Callus   ?3. Porokeratosis   ?4. Diabetic peripheral neuropathy associated with type 2 diabetes mellitus ([place])   ?  ?-Examined patient. ?-Continue diabetic shoes daily. ?-Toenails 1-5 b/l were debrided in length and girth with sterile nail nippers and dremel without iatrogenic bleeding.  ?-Callus(es) bilateral heels and submet head 1 b/l pared utilizing sterile scalpel blade without complication or incident. Total number debrided =3. ?-Painful porokeratotic lesion(s) submet head 4 left foot pared and enucleated with  sterile scalpel blade without incident. Total number of lesions debrided=1. ?-Patient/POA to call should there be question/concern in the interim.  ? ?Return in about 3 months (around 03/13/2022). ? ?Marzetta Board, DPM  ?

## 2022-01-03 ENCOUNTER — Telehealth (HOSPITAL_BASED_OUTPATIENT_CLINIC_OR_DEPARTMENT_OTHER): Payer: Medicare Other | Admitting: Psychiatry

## 2022-01-03 DIAGNOSIS — F330 Major depressive disorder, recurrent, mild: Secondary | ICD-10-CM | POA: Diagnosis not present

## 2022-01-03 DIAGNOSIS — F411 Generalized anxiety disorder: Secondary | ICD-10-CM

## 2022-01-03 MED ORDER — DULOXETINE HCL 60 MG PO CPEP: 60.0000 mg | ORAL_CAPSULE | Freq: Every day | ORAL | 1 refills | Status: DC

## 2022-01-03 MED ORDER — DULOXETINE HCL 60 MG PO CPEP: 60.0000 mg | ORAL_CAPSULE | Freq: Every day | ORAL | 0 refills | Status: DC

## 2022-01-03 MED ORDER — BUPROPION HCL 100 MG PO TABS: 100.0000 mg | ORAL_TABLET | Freq: Two times a day (BID) | ORAL | 1 refills | Status: DC

## 2022-01-03 MED ORDER — BUPROPION HCL 100 MG PO TABS: 100.0000 mg | ORAL_TABLET | Freq: Two times a day (BID) | ORAL | 0 refills | Status: DC

## 2022-01-03 NOTE — Progress Notes (Signed)
Virtual Visit via Phone note ?I connected with [name] [name] on 01/03/22 at  4:00 PM EDT by phone and verified that I am speaking with the correct person using two identifiers. ? ?Location: ?Patient: home ?Provider: office ?  ?I discussed the limitations of evaluation and management by telemedicine and the availability of in person appointments. The patient expressed understanding and agreed to proceed. ? ?History of Present Illness: ?Keerthi is stressed. She has bed bugs and is working with her apartment complex to get treatment. This is causing increased anxiety. She is sleeping well. Her appetite is decreased and she is only eating 1 meal/day. She denies depression. She denies anhedonia and isolation. She denies SI/HI.  ?  ?Observations/Objective: ?Psychiatric Specialty Exam: ? ?General Appearance: unable to assess  ?Eye Contact:  unable to assess  ?Speech:  Clear and Coherent and Normal Rate  ?Volume:  Normal  ?Mood:  Euthymic  ?Affect:  Full Range  ?Thought Process:  Goal Directed, Linear, and Descriptions of Associations: Intact  ?Orientation:  Full (Time, Place, and Person)  ?Thought Content:  Logical  ?Suicidal Thoughts:  No  ?Homicidal Thoughts:  No  ?Memory:  Immediate;   Good  ?Judgement:  Good  ?Insight:  Good  ?Psychomotor Activity: unable to assess  ?Concentration:  Concentration: Good  ?Recall:  Good  ?Fund of Knowledge:  Good  ?Language:  Good  ?Akathisia:  unable to assess  ?Handed:  unable to assess  ?AIMS (if indicated):     ?Assets:  Communication Skills ?Desire for Improvement ?Financial Resources/Insurance ?Housing ?Resilience ?Social Support ?Talents/Skills ?Transportation ?Vocational/Educational  ?ADL's:  unable to assess  ?Cognition:  WNL  ?Sleep:     ? ? ? ? ?Assessment and Plan: ? ? ?  01/03/2022  ?  4:17 PM 12/19/2021  ? 11:05 AM 10/04/2021  ?  4:38 PM 08/01/2021  ? 10:42 AM 07/05/2021  ?  4:37 PM  ?Depression screen PHQ 2/9  ?Decreased Interest 0 0 0 0 0  ?Down, Depressed, Hopeless  0 0 0 0 0  ?PHQ - 2 Score 0 0 0 0 0  ?Altered sleeping 0 0   0  ?Tired, decreased energy 0 0   0  ?Change in appetite 3 0   0  ?Feeling bad or failure about yourself  0 0   0  ?Trouble concentrating 0 0   0  ?Moving slowly or fidgety/restless 0 0   0  ?Suicidal thoughts 0 0   0  ?PHQ-9 Score 3 0   0  ?Difficult doing work/chores  Not difficult at all   Not difficult at all  ? ? ?Flowsheet Row Video Visit from 01/03/2022 in [facility] Video Visit from 10/04/2021 in [facility] Office Visit from 07/05/2021 in [facility]  ?C-SSRS RISK CATEGORY No Risk No Risk No Risk  ? ?  ? ? ?Status of current problems: stable  ? ?Meds:  ?1. GAD (generalized anxiety disorder) ?- buPROPion (WELLBUTRIN) 100 MG tablet; Take 1 tablet (100 mg total) by mouth 2 (two) times daily.  Dispense: 180 tablet; Refill: 1 ?- DULoxetine (CYMBALTA) 60 MG capsule; Take 1 capsule (60 mg total) by mouth daily.  Dispense: 90 capsule; Refill: 1 ? ?2. Mild episode of recurrent major depressive disorder (HCC) ?- buPROPion (WELLBUTRIN) 100 MG tablet; Take 1 tablet (100 mg total) by mouth 2 (two) times daily.  Dispense: 180 tablet; Refill: 1 ?- DULoxetine (CYMBALTA) 60 MG capsule; Take 1  capsule (60 mg total) by mouth daily.  Dispense: 90 capsule; Refill: 1 ? ? ? ?Therapy: brief supportive therapy provided. Discussed psychosocial stressors in detail.   ? ?Collaboration of Care: Other none ? ?Patient/Guardian was advised Release of Information must be obtained prior to any record release in order to collaborate their care with an outside provider. Patient/Guardian was advised if they have not already done so to contact the registration department to sign all necessary forms in order for [place] to release information regarding their care.  ? ?Consent: Patient/Guardian gives verbal consent for treatment and assignment of benefits for services provided  during this visit. Patient/Guardian expressed understanding and agreed to proceed.  ? ? ? ?Follow Up Instructions: ?Follow up in 5-6 months or sooner if needed ?  ? ?I discussed the assessment and treatment plan with the patient. The patient was provided an opportunity to ask questions and all were answered. The patient agreed with the plan and demonstrated an understanding of the instructions. ?  ?The patient was advised to call back or seek an in-person evaluation if the symptoms worsen or if the condition fails to improve as anticipated. ? ?I provided 11 minutes of non-face-to-face time during this encounter. ? ? ?Charlcie Cradle, MD ? ?

## 2022-01-16 ENCOUNTER — Ambulatory Visit (INDEPENDENT_AMBULATORY_CARE_PROVIDER_SITE_OTHER): Payer: Medicare Other | Admitting: Pharmacist

## 2022-01-16 VITALS — BP 141/73 | HR 65

## 2022-01-16 DIAGNOSIS — E785 Hyperlipidemia, unspecified: Secondary | ICD-10-CM | POA: Diagnosis not present

## 2022-01-16 DIAGNOSIS — I10 Essential (primary) hypertension: Secondary | ICD-10-CM | POA: Diagnosis not present

## 2022-01-16 DIAGNOSIS — F330 Major depressive disorder, recurrent, mild: Secondary | ICD-10-CM | POA: Diagnosis not present

## 2022-01-16 DIAGNOSIS — F411 Generalized anxiety disorder: Secondary | ICD-10-CM

## 2022-01-16 MED ORDER — REPATHA SURECLICK 140 MG/ML ~~LOC~~ SOAJ
140.0000 mg | SUBCUTANEOUS | 3 refills | Status: DC
Start: 2022-01-16 — End: 2022-10-04

## 2022-01-16 NOTE — Patient Instructions (Addendum)
Please start checking blood pressure at home.  ?We will call you tomorrow with your lab results and any medication changes ? ?Please continue: ?Metoprolol succinate '50mg'$  daily ?Spironolactone '25mg'$  daily ?Furosemide '40mg'$  daily ?Irbesartan '150mg'$  daily ? ?Decrease the amount of hot dogs and lemonade you are eating ?

## 2022-01-16 NOTE — Progress Notes (Signed)
Patient ID: [name] [name]                 DOB: September 23, 1950                    MRN: [id number]     HPI: [name] [name] is a 71 y.o. female patient referred to lipid clinic by Dr. Harrington Challenger. PMH is significant for HTN, HLD, T2DM (A1c 6.3%), GERD, gout, and OSA on CPAP. Patient followed by Dr. Harrington Challenger for HTN and HLD since 2013. Has been taking atorvastatin 40 mg daily and ezetimibe 10 mg daily since Jan 2018.  At last visit both patients lipids and HTN were addressed. Home cuff was found to be accurate. She was started on irbesartan 128m daily. She was also started on Repatha 1488mq 14 days.  Patient presents today to PharmD clinic. She denies dizziness, lightheadedness, headache, SOB or swelling (mild that comes and goes). She has not been checking blood pressure at home because she says that her machine keeps giving her an error message.  She is still eating hot dogs and drinking lemonade. Cut back some on french fries. Gave herself her first injection of Repatha without issue. Has only missed 1 dose of irbesartan.  Current Lipid Medications:  Atorvastatin 40 mg daily Repatha 14064m 14 days  Current HTN Medications: Metoprolol succinate 44m36mily Spironolactone 25mg63mly Furosemide 40mg 78my Irbesartan 144mg d55m  Intolerances: valsartan (sleepy) Risk Factors: HTN, DM, OSA LDL goal: <70 mg/dL  Diet: lost weight by eating less/one meal a day Coffee with a lot of cream and sugar Lunch (1-2p): chicken, green beans/cabbage, collard greens & rice or mac n cheese Doesn't eat dinner unless she's out and gets a hot dog and french fries ~once a week Doesn't eat anything after 6pm  Exercise: ambulates with a walker when traveling. Has trouble getting up the stairs. Used to go on walks but has history of falls   Family History: Cancer in her sister; Congestive Heart Failure in her brother; Diabetes in her father; Heart disease in her mother; Hyperlipidemia in her brother and  sister; Hypertension in her brother, sister, and sister.   Social History: Prior tobacco use (0.25 pack-year history, quit 1977). Denies alcohol or illicit drug use.  Labs: 11/13/2021: TC 135, TG 97, HDL 39, LDL 78 (atorvastatin 40 mg, ezetimibe 10 mg) 11/12/2019: TC 118, TG 103, HDL 36, LDL 63 (atorvastatin 40 mg, ezetimibe 10 mg) 11/27/2016: TC 121, TG 99, HDL 28, LDL 73 (atorvastatin 40 mg, ezetimibe 10 mg)   Past Medical History:  Diagnosis Date   Allergy    Angina    Anxiety    Arthritis    Asthma    Blood transfusion    Blood transfusion without reported diagnosis    Cataract    Chronic cough    Now followed by pulmonary   Chronic lower back pain    Complication of anesthesia    "just wake up coughing; that's all"   Concussion 11/18/11   "fell at dr's office; hit head"   COPD (chronic obstructive pulmonary disease) (HCC)   [place] gastric emptying    Depression    Diabetes mellitus type II    "take insulin & pills"   Dyslipidemia    Exertional dyspnea    GERD (gastroesophageal reflux disease)    Glaucoma    Gout    Headache(784.0) 11/18/11   "I've had mild headaches the last couple days"   Headache(784.0) 01/08/12   "  pretty constant since 11/18/11's concussion"   Heart murmur    History of colonoscopy    HTN (hypertension)    Myocardial infarction [facility])    Osteoporosis    Peripheral neuropathy    Pneumonia    "i've had it once" (11/18/11)   Sleep apnea    on CPAP 12   Syncope 06/16/2012   Vocal cord paralysis, unilateral partial    "right"    Current Outpatient Medications on File Prior to Visit  Medication Sig Dispense Refill   Accu-Chek FastClix Lancets MISC Use to check blood sugars 3x per day. E11.9 306 each 12   albuterol (VENTOLIN HFA) 108 (90 Base) MCG/ACT inhaler Inhale 2 puffs into the lungs every 4 (four) hours as needed for wheezing or shortness of breath (coughing fits). 18 g 1   Alcohol Swabs (B-D SINGLE USE SWABS REGULAR) PADS USE THREE TIMES  DAILY 300 each 3   allopurinol (ZYLOPRIM) 300 MG tablet TAKE 1 TABLET BY MOUTH  DAILY 90 tablet 3   ammonium lactate (LAC-HYDRIN) 12 % lotion APPLY 1 APPLICATION TOPICALLY TWICE DAILY AS NEEDED FOR DRY SKIN (SUBSTITUTED FOR LAC HYDRIN) 400 g 12   aspirin EC 81 MG tablet Take 81 mg by mouth daily.     atorvastatin (LIPITOR) 40 MG tablet TAKE 1 TABLET BY MOUTH  DAILY 90 tablet 3   B-D ULTRAFINE III SHORT PEN 31G X 8 MM MISC USE TO INJECT THREE TIMES A DAY 90 each 3   Blood Glucose Monitoring Suppl (ACCU-CHEK GUIDE) w/Device KIT Use to check blood sugars 3x per day. E11.9 1 kit 0   Brimonidine Tartrate-Timolol (COMBIGAN OP) Combigan     budesonide-formoterol (SYMBICORT) 160-4.5 MCG/ACT inhaler Inhale 2 puffs into the lungs in the morning and at bedtime. with spacer and rinse mouth afterwards. 30.6 g 2   buPROPion (WELLBUTRIN) 100 MG tablet Take 1 tablet (100 mg total) by mouth 2 (two) times daily. 180 tablet 1   COMBIGAN 0.2-0.5 % ophthalmic solution INSTILL 1 DROP INTO THE  LEFT EYE EVERY 12 HOURS 5 mL 12   Dulaglutide (TRULICITY) 1.5 HU/3.1SH SOPN INJECT THE CONTENTS OF ONE  PEN SUBCUTANEOUSLY WEEKLY  AS DIRECTED 6 mL 3   DULoxetine (CYMBALTA) 60 MG capsule Take 1 capsule (60 mg total) by mouth daily. 90 capsule 1   Evolocumab (REPATHA SURECLICK) 702 MG/ML SOAJ Inject 140 mg into the skin every 14 (fourteen) days. 2 mL 11   famciclovir (FAMVIR) 500 MG tablet Take 1 tablet (500 mg total) by mouth 3 (three) times daily. 21 tablet 0   fluticasone (FLONASE) 50 MCG/ACT nasal spray Place 1 spray into both nostrils in the morning and at bedtime. 48 g 2   furosemide (LASIX) 40 MG tablet Take one tablet by mouth daily 90 tablet 3   glucose blood (ACCU-CHEK GUIDE) test strip Use to check blood sugars 3x per day. E11.9 100 each 12   insulin glargine (LANTUS SOLOSTAR) 100 UNIT/ML Solostar Pen Inject 35 units subcutaneously once daily as directed 75 mL 3   insulin lispro (HUMALOG KWIKPEN) 100 UNIT/ML KwikPen  Inject 20 Units into the skin daily after supper. 225 mL 3   Insulin Syringe-Needle U-100 (BD INSULIN SYRINGE ULTRAFINE) 31G X 15/64" 0.5 ML MISC Use to inject three times a day 300 each 2   irbesartan (AVAPRO) 150 MG tablet Take 1 tablet (150 mg total) by mouth daily. 90 tablet 3   latanoprost (XALATAN) 0.005 % ophthalmic solution Place 1 drop into both eyes  at bedtime. 2.5 mL 3   levocetirizine (XYZAL) 5 MG tablet Take 1 tablet (5 mg total) by mouth every evening. 30 tablet 0   LINZESS 290 MCG CAPS capsule Take 1 capsule (290 mcg total) by mouth daily as needed. 30 capsule    metoCLOPramide (REGLAN) 10 MG tablet 1/2 tab(s) orally 4 times a day (before meals and at bedtime) for 30 day(s)     metoprolol succinate (TOPROL-XL) 50 MG 24 hr tablet Take one tablet at bedtime for blood pressure 270 tablet 3   mometasone (ELOCON) 0.1 % cream Apply to affected area daily as needed do not use on face (Patient not taking: Reported on 01/03/2022) 45 g 5   montelukast (SINGULAIR) 10 MG tablet Take 1 tablet (10 mg total) by mouth daily. 90 tablet 2   Multiple Vitamins-Minerals (MULTIVITAMIN PO) Take 1 tablet by mouth daily.     RESTASIS MULTIDOSE 0.05 % ophthalmic emulsion Place 1 drop into both eyes daily as needed.  3   spironolactone (ALDACTONE) 25 MG tablet TAKE 1 TABLET BY MOUTH  DAILY BEFORE SUPPER 90 tablet 3   No current facility-administered medications on file prior to visit.    Allergies  Allergen Reactions   Bee Venom Hives and Swelling   Propoxyphene Hcl Itching   Amlodipine Besylate Swelling   Hydrocodone Other (See Comments)    With Vicodin - makes patient "jittery", and "hangover effect - sleepy next day"   Lisinopril Cough    Changed to ARB   Metformin And Related Diarrhea    GI distress   Metronidazole Other (See Comments)    REACTION: "just didn't work; my body never did heal from it"   Valsartan Other (See Comments)    REACTION:  "sleep more the next day after I took it; it made  me real tired"   Propoxyphene     Other reaction(s): Unknown   Sulfasalazine     Other reaction(s): Unknown   Xyzal [Levocetirizine Dihydrochloride] Rash    Assessment/Plan:  1. Hyperlipidemia - LDL above goal of <70 mg/dL on atorvastatin 40 mg and ezetimibe 10 mg daily.Patient started on Repatha last visit. Zetia was stopped. Repeat lipid panel was ordered for 03/27/22.  2. Hypertension - BP in clinic is above goal of <130/80. No home readings available. Pressure is better than previous visit. Will check a BMP today since starting irbesartan. Will increase irbesartan to 343m if labs stable. Reviewed proper technique. Patient will buy new cuff if she cannot get current one to work. Follow up 01/31/22 in clinic.  Thank you,  MRamond Dial Pharm.D, BCPS, CPP [place] [id number] [place] [address] Phone: [phone] Fax: [phone]

## 2022-01-17 ENCOUNTER — Other Ambulatory Visit: Payer: Self-pay | Admitting: Family Medicine

## 2022-01-17 ENCOUNTER — Telehealth: Payer: Self-pay | Admitting: Pharmacist

## 2022-01-17 ENCOUNTER — Ambulatory Visit
Admission: RE | Admit: 2022-01-17 | Discharge: 2022-01-17 | Disposition: A | Discharge status: Home / Self Care | Payer: Medicare Other | Source: Ambulatory Visit | Attending: Family Medicine | Admitting: Family Medicine

## 2022-01-17 ENCOUNTER — Telehealth: Payer: Self-pay

## 2022-01-17 DIAGNOSIS — R928 Other abnormal and inconclusive findings on diagnostic imaging of breast: Secondary | ICD-10-CM | POA: Diagnosis not present

## 2022-01-17 DIAGNOSIS — R2232 Localized swelling, mass and lump, left upper limb: Secondary | ICD-10-CM

## 2022-01-17 DIAGNOSIS — N6012 Diffuse cystic mastopathy of left breast: Secondary | ICD-10-CM | POA: Diagnosis not present

## 2022-01-17 LAB — BASIC METABOLIC PANEL
BUN/Creatinine Ratio: 17 (ref 12–28)
BUN: 17 mg/dL (ref 8–27)
CO2: 28 mmol/L (ref 20–29)
Calcium: 9.6 mg/dL (ref 8.7–10.3)
Chloride: 103 mmol/L (ref 96–106)
Creatinine, Ser: 0.99 mg/dL (ref 0.57–1.00)
Glucose: 75 mg/dL (ref 70–99)
Potassium: 3.9 mmol/L (ref 3.5–5.2)
Sodium: 144 mmol/L (ref 134–144)
eGFR: 62 mL/min/{1.73_m2} (ref >59)

## 2022-01-17 MED ORDER — IRBESARTAN 300 MG PO TABS: 300.0000 mg | ORAL_TABLET | Freq: Every day | ORAL | 3 refills | Status: DC

## 2022-01-17 NOTE — Telephone Encounter (Signed)
BMET stable. Will increase irbesartan from '150mg'$  to '300mg'$  daily. Pt is aware of dose change. Will plan to double up and take 2 of her '150mg'$  tablets each day until she runs out, then pick up rx for higher strength and resume taking 1 tablet daily. Has f/u scheduled on 6/1.

## 2022-01-17 NOTE — Telephone Encounter (Signed)
CMN submitted ?

## 2022-01-23 ENCOUNTER — Ambulatory Visit (INDEPENDENT_AMBULATORY_CARE_PROVIDER_SITE_OTHER): Payer: Medicare Other | Admitting: Family Medicine

## 2022-01-23 ENCOUNTER — Encounter: Payer: Self-pay | Admitting: Family Medicine

## 2022-01-23 DIAGNOSIS — E1149 Type 2 diabetes mellitus with other diabetic neurological complication: Secondary | ICD-10-CM

## 2022-01-23 DIAGNOSIS — G629 Polyneuropathy, unspecified: Secondary | ICD-10-CM

## 2022-01-23 DIAGNOSIS — F339 Major depressive disorder, recurrent, unspecified: Secondary | ICD-10-CM | POA: Diagnosis not present

## 2022-01-23 DIAGNOSIS — I10 Essential (primary) hypertension: Secondary | ICD-10-CM

## 2022-01-23 NOTE — Patient Instructions (Signed)
Come see me in about 2 months. AUGUST. Call sooner with problems.  I will send in the paperwork for your shoes.  I will get Rosendo Gros, our Education officer, museum, and she will get in touch with you.

## 2022-01-24 ENCOUNTER — Encounter: Payer: Self-pay | Admitting: Family Medicine

## 2022-01-24 NOTE — Assessment & Plan Note (Signed)
Cardiology managing her BP and have recently increased her ARB dose. She seems to be tolerarting it well and has good control today.

## 2022-01-24 NOTE — Progress Notes (Signed)
    CHIEF COMPLAINT / HPI:  1. F/u DM. Continues with her own "regimen" Bases her insul;in on amount eaten. 2. Some weight loss. Intentional and gradual 3. Needs diabetic shoes   PERTINENT  PMH / PSH: I have reviewed the patient's medications, allergies, past medical and surgical history, smoking status and updated in the EMR as appropriate.   OBJECTIVE:  BP (!) 112/53   Pulse 78   Ht '5\' 1"'$  (1.549 m)   Wt 175 lb 9.6 oz (79.7 kg)   SpO2 100%   BMI 33.18 kg/m   Vital signs reviewed. GENERAL: Well-developed, well-nourished, no acute distress.  CARDIOVASCULAR: Regular rate and rhythm no murmur gallop or rub LUNGS: Clear to auscultation bilaterally, no rales or wheeze. ABDOMEN: Soft positive bowel sounds NEURO: decreased sensation to soft touch B feet extending to mid shin. Also sopme decrease B hands and distal fingers. 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 Some callous dorsal 5th toes. MSK: Movement of extremity x 4.Using rolling walker. Stands with assistance of walker only. PSYCH: AxOx4. Good eye contact.. No psychomotor retardation or agitation. Appropriate speech fluency and content. Asks and answers questions appropriately. Mood is congruent.   ASSESSMENT / PLAN:   Diabetes mellitus type 2 with neurological manifestations ([place]) Remarkably good control with her "regimen'  Which she has maintained for a bout a year now. Cannot argue with success. Next A1C due in June or July. Nomed changes  HYPERTENSION, BENIGN SYSTEMIC Cardiology managing her BP and have recently increased her ARB dose. She seems to be tolerarting it well and has good control today.  Neuropathy Significant polyneuropathy, likely from DM. Needs custom diabetic shoes and have filled out paperwork and faxed.  Major depressive disorder, recurrent episode Followed by  [facility] and seems to be doing really well   [name] MD

## 2022-01-24 NOTE — Assessment & Plan Note (Signed)
Followed by [facility] and seems to be doing really well

## 2022-01-24 NOTE — Assessment & Plan Note (Signed)
Significant polyneuropathy, likely from DM. Needs custom diabetic shoes and have filled out paperwork and faxed.

## 2022-01-24 NOTE — Assessment & Plan Note (Signed)
Remarkably good control with her "regimen'  Which she has maintained for a bout a year now. Cannot argue with success. Next A1C due in June or July. Nomed changes

## 2022-01-30 ENCOUNTER — Encounter: Payer: Self-pay | Admitting: Family Medicine

## 2022-01-31 ENCOUNTER — Ambulatory Visit: Payer: Medicare Other

## 2022-01-31 DIAGNOSIS — H401133 Primary open-angle glaucoma, bilateral, severe stage: Secondary | ICD-10-CM | POA: Diagnosis not present

## 2022-02-07 ENCOUNTER — Ambulatory Visit (INDEPENDENT_AMBULATORY_CARE_PROVIDER_SITE_OTHER): Payer: Medicare Other | Admitting: Pharmacist

## 2022-02-07 ENCOUNTER — Encounter: Payer: Self-pay | Admitting: Pharmacist

## 2022-02-07 DIAGNOSIS — E1149 Type 2 diabetes mellitus with other diabetic neurological complication: Secondary | ICD-10-CM | POA: Diagnosis not present

## 2022-02-07 NOTE — Progress Notes (Signed)
Reviewed: I agree with the documentation and management of Dr. Koval. 

## 2022-02-07 NOTE — Patient Instructions (Addendum)
Nice to see you today.  Overall I think you are doing very well.   Please continue to check your blood sugar any time you feel like you have a low blood sugar.   Change your Furosemide to 1 pill in the morning.   Increase dosing frequency 1 drop Combigan twice daily

## 2022-02-07 NOTE — Progress Notes (Signed)
    S:     Chief Complaint  Patient presents with   Medication Management    Med Review - Diabetes    Sharonann Malbrough is a 71 y.o. female who presents for medication management due to concern of polypharmacy. PMH is significant for diabetes, asthma, glaucoma. Patient was referred and last seen by Primary Care Provider, Dr. Nori Riis, on 01/23/2022.   Today, She arrives in good spirits and presents with assistance with walker.   Do you know what each of your medicines is for? yes Do you feel that your medications are working for you? yes Have you been experiencing any side effects to the medications prescribed? no Do you ever have trouble remembering to take your medicine? no How many days in the past week did you miss taking your medicines? 0 Do you ever not take a medicine because you feel you do not need it? no Do you have any problems obtaining medications due to finances? no Do you have any problems obtaining medications due to transportation? no In the past 6 months, have you missed getting a refill or a new prescription filled on time? no Do you have any physical problems such as vision loss or trouble opening bottles that keep you from taking your medicines as prescribed? No  Patient reported dietary habits: Eats 1 large meal/day Dinner:    O:  Physical Exam Constitutional:      Appearance: She is obese.  Pulmonary:     Effort: Pulmonary effort is normal.  Neurological:     Mental Status: She is alert.  Psychiatric:        Mood and Affect: Mood normal.        Behavior: Behavior normal.        Thought Content: Thought content normal.     Review of Systems  All other systems reviewed and are negative.   Vitals:   02/07/22 1604  BP: (!) 114/56  Pulse: 69  SpO2: 98%     A/P: Polypharmacy:  Understanding of regimen: excellent  Understanding of indications: excellent  Potential of compliance: good  Patient has no know adherence challenges based on above  barriers. Medication list reviewed and updated.  The following issues were noted:  1) Furosemide, currently taking pm and reporting nocturia. Patient agreed to taking Furosemide earlier in the day to decrease nocturia.  2) Patient taking Combigan 2 drops in the left eye once daily. Following discussion patient agreed to take 1 drop in the left eye twice daily as prescribed.  3) Patient taking 2 Buproprion IR 100 mg QPM. In order to optimize dosing, suggest change to Buproprion 150 XL once daily.   Diabetes longstanding currently well controlled on current regimen. No suggested change at this time.   Written patient instructions and updated medication list provided. Total time in face to face counseling 38 minutes.    Follow up Pharmacist prn. Patient seen with Berdie Ogren, PharmD Candidate.

## 2022-02-07 NOTE — Assessment & Plan Note (Signed)
Diabetes longstanding currently well controlled on current regimen. No suggested change at this time.

## 2022-02-17 ENCOUNTER — Other Ambulatory Visit: Payer: Self-pay | Admitting: Allergy

## 2022-02-17 ENCOUNTER — Other Ambulatory Visit: Payer: Self-pay | Admitting: Family Medicine

## 2022-02-22 ENCOUNTER — Ambulatory Visit: Payer: Medicare Other | Admitting: Pharmacist

## 2022-02-22 NOTE — Progress Notes (Deleted)
Patient ID: [name] [name]                 DOB: 1951-01-04                    MRN: [id number]     HPI: [name] [name] is a 71 y.o. female patient referred to PharmD clinic by Dr. Harrington Challenger for lipid and HTN management. PMH is significant for HTN, HLD, T2DM (A1c 6.3%), GERD, gout, and OSA on CPAP. Patient followed by Dr. Harrington Challenger for HTN and HLD since 2013. At previous PharmD visit, pt was started irbesartan 124m for HTN which has most recently been titrated to 3075mdaily. She was also started on Repatha and Zetia was stopped.  Home cuff was found to be accurate.  Checking bp at home? Bmet today on irb Diet updates - hot dogs, french fries and lemonade at last visit Amlodipine if needed   Current Lipid Medications:  Atorvastatin 40 mg daily Repatha 14078m 14 days  Current HTN Medications: Irbesartan 300m67mily Metoprolol succinate 50mg2mly Spironolactone 25mg 100my Furosemide 40mg d42m  Intolerances: valsartan (sleepy) Risk Factors: HTN, DM, OSA LDL goal: <70 mg/dL  Diet: lost weight by eating less/one meal a day Coffee with a lot of cream and sugar Lunch (1-2p): chicken, green beans/cabbage, collard greens & rice or mac and cheese Doesn't eat dinner unless she's out and gets a hot dog and french fries ~once a week Doesn't eat anything after 6pm  Exercise: ambulates with a walker when traveling. Has trouble getting up the stairs. Used to go on walks but has history of falls   Family History: Cancer in her sister; Congestive Heart Failure in her brother; Diabetes in her father; Heart disease in her mother; Hyperlipidemia in her brother and sister; Hypertension in her brother, sister, and sister.   Social History: Prior tobacco use (0.25 pack-year history, quit 1977). Denies alcohol or illicit drug use.  Labs: 11/13/2021: TC 135, TG 97, HDL 39, LDL 78 (atorvastatin 40 mg, ezetimibe 10 mg) 11/12/2019: TC 118, TG 103, HDL 36, LDL 63 (atorvastatin 40 mg, ezetimibe 10  mg) 11/27/2016: TC 121, TG 99, HDL 28, LDL 73 (atorvastatin 40 mg, ezetimibe 10 mg)   Past Medical History:  Diagnosis Date   Allergy    Angina    Anxiety    Arthritis    Asthma    Blood transfusion    Blood transfusion without reported diagnosis    Cataract    Chronic cough    Now followed by pulmonary   Chronic lower back pain    Complication of anesthesia    "just wake up coughing; that's all"   Concussion 11/18/11   "fell at dr's office; hit head"   COPD (chronic obstructive pulmonary disease) (HCC)   Flaming Gorgelayed gastric emptying    Depression    Diabetes mellitus type II    "take insulin & pills"   Dyslipidemia    Exertional dyspnea    GERD (gastroesophageal reflux disease)    Glaucoma    Gout    Headache(784.0) 11/18/11   "I've had mild headaches the last couple days"   Headache(784.0) 01/08/12   "pretty constant since 11/18/11's concussion"   Heart murmur    History of colonoscopy    HTN (hypertension)    Myocardial infarction (HCC)   Woodsonteoporosis    Peripheral neuropathy    Pneumonia    "i've had it once" (11/18/11)   Sleep apnea  on CPAP 12   Syncope 06/16/2012   Vocal cord paralysis, unilateral partial    "right"    Current Outpatient Medications on File Prior to Visit  Medication Sig Dispense Refill   Accu-Chek FastClix Lancets MISC Use to check blood sugars 3x per day. E11.9 306 each 12   albuterol (VENTOLIN HFA) 108 (90 Base) MCG/ACT inhaler Inhale 2 puffs into the lungs every 4 (four) hours as needed for wheezing or shortness of breath (coughing fits). (Patient not taking: Reported on 02/07/2022) 18 g 1   Alcohol Swabs (B-D SINGLE USE SWABS REGULAR) PADS USE THREE TIMES DAILY 300 each 3   allopurinol (ZYLOPRIM) 300 MG tablet TAKE 1 TABLET BY MOUTH DAILY 100 tablet 2   ammonium lactate (LAC-HYDRIN) 12 % lotion APPLY 1 APPLICATION TOPICALLY TWICE DAILY AS NEEDED FOR DRY SKIN (SUBSTITUTED FOR LAC HYDRIN) 400 g 12   aspirin EC 81 MG tablet Take 81 mg by  mouth daily.     atorvastatin (LIPITOR) 40 MG tablet TAKE 1 TABLET BY MOUTH DAILY 100 tablet 2   B-D ULTRAFINE III SHORT PEN 31G X 8 MM MISC USE TO INJECT THREE TIMES A DAY 90 each 3   Blood Glucose Monitoring Suppl (ACCU-CHEK GUIDE) w/Device KIT Use to check blood sugars 3x per day. E11.9 1 kit 0   Brimonidine Tartrate-Timolol (COMBIGAN OP) Combigan     budesonide-formoterol (SYMBICORT) 160-4.5 MCG/ACT inhaler Inhale 2 puffs into the lungs in the morning and at bedtime. with spacer and rinse mouth afterwards. 30.6 g 2   buPROPion (WELLBUTRIN) 100 MG tablet Take 1 tablet (100 mg total) by mouth 2 (two) times daily. 180 tablet 1   COMBIGAN 0.2-0.5 % ophthalmic solution INSTILL 1 DROP INTO THE  LEFT EYE EVERY 12 HOURS 5 mL 12   Dulaglutide (TRULICITY) 1.5 DU/2.0UR SOPN INJECT THE CONTENTS OF ONE  PEN SUBCUTANEOUSLY WEEKLY  AS DIRECTED 6 mL 3   DULoxetine (CYMBALTA) 60 MG capsule Take 1 capsule (60 mg total) by mouth daily. 90 capsule 1   Evolocumab (REPATHA SURECLICK) 427 MG/ML SOAJ Inject 140 mg into the skin every 14 (fourteen) days. 6 mL 3   famciclovir (FAMVIR) 500 MG tablet Take 1 tablet (500 mg total) by mouth 3 (three) times daily. 21 tablet 0   famotidine (PEPCID) 40 MG tablet TAKE 1 TABLET BY MOUTH DAILY 90 tablet 1   fluticasone (FLONASE) 50 MCG/ACT nasal spray Place 1 spray into both nostrils in the morning and at bedtime. 48 g 2   furosemide (LASIX) 40 MG tablet Take one tablet by mouth daily 90 tablet 3   glucose blood (ACCU-CHEK GUIDE) test strip Use to check blood sugars 3x per day. E11.9 100 each 12   insulin glargine (LANTUS SOLOSTAR) 100 UNIT/ML Solostar Pen Inject 35 units subcutaneously once daily as directed 75 mL 3   insulin lispro (HUMALOG KWIKPEN) 100 UNIT/ML KwikPen Inject 20 Units into the skin daily after supper. 225 mL 3   Insulin Syringe-Needle U-100 (BD INSULIN SYRINGE ULTRAFINE) 31G X 15/64" 0.5 ML MISC Use to inject three times a day 300 each 2   irbesartan (AVAPRO)  300 MG tablet Take 1 tablet (300 mg total) by mouth daily. 90 tablet 3   latanoprost (XALATAN) 0.005 % ophthalmic solution Place 1 drop into both eyes at bedtime. 2.5 mL 3   levocetirizine (XYZAL) 5 MG tablet Take 1 tablet (5 mg total) by mouth every evening. 30 tablet 0   LINZESS 290 MCG CAPS capsule Take 1  capsule (290 mcg total) by mouth daily as needed. 30 capsule    metoCLOPramide (REGLAN) 10 MG tablet Take 20 mg by mouth at bedtime.     metoprolol succinate (TOPROL-XL) 50 MG 24 hr tablet Take one tablet at bedtime for blood pressure 270 tablet 3   mometasone (ELOCON) 0.1 % cream Apply to affected area daily as needed do not use on face (Patient not taking: Reported on 01/03/2022) 45 g 5   montelukast (SINGULAIR) 10 MG tablet TAKE 1 TABLET BY MOUTH DAILY 90 tablet 1   Multiple Vitamins-Minerals (MULTIVITAMIN PO) Take 1 tablet by mouth daily.     RESTASIS MULTIDOSE 0.05 % ophthalmic emulsion Place 1 drop into both eyes daily as needed.  3   spironolactone (ALDACTONE) 25 MG tablet TAKE 1 TABLET BY MOUTH  DAILY BEFORE SUPPER 90 tablet 3   No current facility-administered medications on file prior to visit.    Allergies  Allergen Reactions   Bee Venom Hives and Swelling   Propoxyphene Hcl Itching   Amlodipine Besylate Swelling   Hydrocodone Other (See Comments)    With Vicodin - makes patient "jittery", and "hangover effect - sleepy next day"   Lisinopril Cough    Changed to ARB   Metformin And Related Diarrhea    GI distress   Metronidazole Other (See Comments)     "just didn't work; my body never did heal from it"   Valsartan Other (See Comments)      "sleep more the next day after I took it; it made me real tired"   Sulfasalazine Other (See Comments)    Other reaction(s): Unknown   Xyzal [Levocetirizine Dihydrochloride] Rash    Assessment/Plan:  1. Hyperlipidemia - LDL above goal of <70 mg/dL on atorvastatin 40 mg and ezetimibe 10 mg daily. Patient started on Repatha last  visit. Zetia was stopped. Repeat lipids ordered for 03/27/22.  2. Hypertension - BP in clinic is above goal of <130/39mHg.  Larin Weissberg E. Robi Dewolfe, PharmD, BCACP, [id number] [place] [id number]: [phone] Fax: ([phone]/23/2023 10:51 AM

## 2022-02-28 DIAGNOSIS — I10 Essential (primary) hypertension: Secondary | ICD-10-CM | POA: Diagnosis not present

## 2022-03-13 ENCOUNTER — Ambulatory Visit: Payer: Commercial Managed Care - HMO | Admitting: Allergy

## 2022-03-15 ENCOUNTER — Ambulatory Visit: Payer: Medicare Other | Admitting: Podiatry

## 2022-03-22 ENCOUNTER — Other Ambulatory Visit: Payer: Self-pay | Admitting: Family Medicine

## 2022-03-25 ENCOUNTER — Telehealth: Payer: Self-pay | Admitting: Podiatry

## 2022-03-25 NOTE — Telephone Encounter (Signed)
Pt called asking if her Diabetic shoes were ready for pick up.

## 2022-03-27 ENCOUNTER — Ambulatory Visit (INDEPENDENT_AMBULATORY_CARE_PROVIDER_SITE_OTHER): Payer: Medicare Other | Admitting: Podiatry

## 2022-03-27 ENCOUNTER — Encounter: Payer: Self-pay | Admitting: Podiatry

## 2022-03-27 ENCOUNTER — Other Ambulatory Visit: Payer: Medicare Other

## 2022-03-27 DIAGNOSIS — E1142 Type 2 diabetes mellitus with diabetic polyneuropathy: Secondary | ICD-10-CM

## 2022-03-27 DIAGNOSIS — M79675 Pain in left toe(s): Secondary | ICD-10-CM | POA: Diagnosis not present

## 2022-03-27 DIAGNOSIS — E785 Hyperlipidemia, unspecified: Secondary | ICD-10-CM

## 2022-03-27 DIAGNOSIS — M79674 Pain in right toe(s): Secondary | ICD-10-CM | POA: Diagnosis not present

## 2022-03-27 DIAGNOSIS — B351 Tinea unguium: Secondary | ICD-10-CM

## 2022-03-28 ENCOUNTER — Telehealth: Payer: Self-pay | Admitting: Pharmacist

## 2022-03-28 LAB — LIPID PANEL
Chol/HDL Ratio: 2.1 ratio (ref 0.0–4.4)
Cholesterol, Total: 71 mg/dL — ABNORMAL LOW (ref 100–199)
HDL: 34 mg/dL — ABNORMAL LOW (ref >39)
LDL Chol Calc (NIH): 18 mg/dL (ref 0–99)
Triglycerides: 100 mg/dL (ref 0–149)
VLDL Cholesterol Cal: 19 mg/dL (ref 5–40)

## 2022-03-28 LAB — APOLIPOPROTEIN B: Apolipoprotein B: 31 mg/dL (ref <90)

## 2022-03-28 NOTE — Telephone Encounter (Signed)
Reviewed patients labs with her. Excelent lipids. Advised she could stop Zetia but she states she has already stopped taking.  Continue atorvastatin '40mg'$  daily and Repatha '140mg'$  q 14 days.  Patient missed her BP visit with me on 6/1. She states her BP has been up and down. She did get a new BP cuff but she doesn't know how to set it. I scheduled her an appointment in HTN clinic to assist her with this and to check BP.

## 2022-04-01 NOTE — Progress Notes (Signed)
  Subjective:  Patient ID: [name] [name], female    DOB: 1951/04/12,  MRN: [id number]  [name] [name] presents to clinic today for at risk foot care with history of diabetic neuropathy and callus(es) b/l lower extremities and painful thick toenails that are difficult to trim. Painful toenails interfere with ambulation. Aggravating factors include wearing enclosed shoe gear. Pain is relieved with periodic professional debridement. Painful calluses are aggravated when weightbearing with and without shoegear. Pain is relieved with periodic professional debridement.  Last A1c was 6.5%.  Patient did not check blood glucose today.  New problem(s): None.   PCP is Dickie La, MD , and last visit was  December 19, 2021  Allergies  Allergen Reactions   Bee Venom Hives and Swelling   Propoxyphene Hcl Itching   Amlodipine Besylate Swelling   Hydrocodone Other (See Comments)    With Vicodin - makes patient "jittery", and "hangover effect - sleepy next day"   Lisinopril Cough    Changed to ARB   Metformin And Related Diarrhea    GI distress   Metronidazole Other (See Comments)     "just didn't work; my body never did heal from it"   Valsartan Other (See Comments)      "sleep more the next day after I took it; it made me real tired"   Sulfasalazine Other (See Comments)    Other reaction(s): Unknown   Xyzal [Levocetirizine Dihydrochloride] Rash    Review of Systems: Negative except as noted in the HPI.  Objective: No changes noted in today's physical examination. Constitutional Fatina Sprankle is a pleasant 71 y.o. African American female, obese in NAD. AAO x 3.   Vascular CFT immediate b/l LE. Palpable DP/PT pulses b/l LE. Digital hair absent b/l. Skin temperature gradient WNL b/l. No pain with calf compression b/l. Trace edema noted b/l. No cyanosis or clubbing noted b/l LE.  Neurologic Normal speech. Oriented to person, place, and time. Pt has subjective symptoms of  neuropathy. Protective sensation intact 5/5 intact bilaterally with 10g monofilament b/l. Vibratory sensation intact b/l.  Dermatologic Pedal integument with normal turgor, texture and tone b/l LE. No open wounds b/l. No interdigital macerations b/l. Toenails 1-5 b/l elongated, thickened, discolored with subungual debris. +Tenderness with dorsal palpation of nailplates.  Orthopedic: Muscle strength 5/5 to all lower extremity muscle groups bilaterally. Hammertoe deformity noted 2-5 b/l. Pes planus deformity noted bilateral LE. Utilizes rollator for ambulation assistance. Wearing diabetic shoes on today's visit.   Radiographs: None    Latest Ref Rng & Units 11/07/2021   10:37 AM 08/01/2021   10:56 AM 04/18/2021   11:06 AM  Hemoglobin A1C  Hemoglobin-A1c 0.0 - 7.0 % 6.3  6.5  6.6    Assessment/Plan: 1. Pain due to onychomycosis of toenails of both feet   2. Diabetic peripheral neuropathy associated with type 2 diabetes mellitus ([employer])   -Patient was evaluated and treated. All patient's and/or POA's questions/concerns answered on today's visit. -Continue foot and shoe inspections daily. Monitor blood glucose per PCP/Endocrinologist's recommendations. -Patient to continue soft, supportive shoe gear daily. -Mycotic toenails 1-5 bilaterally were debrided in length and girth with sterile nail nippers and dremel without incident. -Patient/POA to call should there be question/concern in the interim.   Return in about 3 months (around 06/27/2022).  Marzetta Board, DPM

## 2022-04-03 ENCOUNTER — Ambulatory Visit (INDEPENDENT_AMBULATORY_CARE_PROVIDER_SITE_OTHER): Payer: Medicare Other

## 2022-04-03 DIAGNOSIS — E1142 Type 2 diabetes mellitus with diabetic polyneuropathy: Secondary | ICD-10-CM

## 2022-04-03 DIAGNOSIS — L84 Corns and callosities: Secondary | ICD-10-CM

## 2022-04-03 NOTE — Progress Notes (Signed)
Patient presents today to pick up diabetic shoes and insoles.  Patient was dispensed 1 pair of diabetic shoes and 3 pairs of foam casted diabetic insoles. Fit was satisfactory. Instructions for break-in and wear was reviewed and a copy was given to the patient.   Re-appointment for regularly scheduled diabetic foot care visits or if they should experience any trouble with the shoes or insoles.  

## 2022-04-09 NOTE — Telephone Encounter (Signed)
Shoes were picked up 04/03/22 by the patient.

## 2022-04-16 NOTE — Progress Notes (Deleted)
Follow Up Note  RE: [name] [name] MRN: [id number] DOB: 02/02/1951 Date of Office Visit: 04/17/2022  Referring provider: Dickie La, MD Primary care provider: Dickie La, MD  Chief Complaint: No chief complaint on file.  History of Present Illness: I had the pleasure of seeing [name] [name] for a follow up visit at the [facility] on 04/16/2022. She is a 71 y.o. female, who is being followed for asthma, allergic rhinitis, LPRD, bee sting allergy. Her previous allergy office visit was on 09/05/2021 with Dr. Maudie Mercury. Today is a regular follow up visit.  Moderate persistent asthma without complication Well-controlled and no prednisone use.  Today's spirometry was unremarkable. Daily controller medication(s): continue Symbicort 171mg 2 puffs twice a day with spacer and rinse mouth afterwards. Continue Singulair 157mdaily  May use albuterol rescue inhaler 2 puffs every 4 to 6 hours as needed for shortness of breath, chest tightness, coughing, and wheezing. May use albuterol rescue inhaler 2 puffs 5 to 15 minutes prior to strenuous physical activities. Monitor frequency of use.  Get spirometry at next visit.   Seasonal and perennial allergic rhinitis Past history - 2018 skin testing was positive to dust mites and tree pollen. Interim history - controlled. Continue environmental control measures. Use Flonase (fluticasone) nasal spray 1 spray per nostril twice a day as needed for nasal congestion.  Nasal saline spray (i.e., Simply Saline) or nasal saline lavage (i.e., NeilMed) is recommended as needed and prior to medicated nasal sprays. May use over the counter antihistamines such as Zyrtec (cetirizine), Claritin (loratadine), Allegra (fexofenadine), or Xyzal (levocetirizine) daily as needed.   LPRD (laryngopharyngeal reflux disease) Stable.  Continue Nexium 4084maily. Continue pepcid 39m61mily.     Bee sting allergy No stings since last visit.  For mild  symptoms you can take over the counter antihistamines such as Benadryl and monitor symptoms closely. If symptoms worsen or if you have severe symptoms including breathing issues, throat closure, significant swelling, whole body hives, severe diarrhea and vomiting, lightheadedness then inject epinephrine and seek immediate medical care afterwards. Consider testing in future.   Return in about 6 months (around 03/05/2022).  Assessment and Plan: [name] [name] 70 y3. female with: No problem-specific Assessment & Plan notes found for this encounter.  No follow-ups on file.  No orders of the defined types were placed in this encounter.  Lab Orders  No laboratory test(s) ordered today    Diagnostics: Spirometry:  Tracings reviewed. Her effort: {Blank single:19197::"Good reproducible efforts.","It was hard to get consistent efforts and there is a question as to whether this reflects a maximal maneuver.","Poor effort, data can not be interpreted."} FVC: ***L FEV1: ***L, ***% predicted FEV1/FVC ratio: ***% Interpretation: {Blank single:19197::"Spirometry consistent with mild obstructive disease","Spirometry consistent with moderate obstructive disease","Spirometry consistent with severe obstructive disease","Spirometry consistent with possible restrictive disease","Spirometry consistent with mixed obstructive and restrictive disease","Spirometry uninterpretable due to technique","Spirometry consistent with normal pattern","No overt abnormalities noted given today's efforts"}.  Please see scanned spirometry results for details.  Skin Testing: {Blank single:19197::"Select foods","Environmental allergy panel","Environmental allergy panel and select foods","Food allergy panel","None","Deferred due to recent antihistamines use"}. *** Results discussed with patient/family.   Medication List:  Current Outpatient Medications  Medication Sig Dispense Refill  . Accu-Chek FastClix Lancets MISC Use to  check blood sugars 3x per day. E11.9 306 each 12  . albuterol (VENTOLIN HFA) 108 (90 Base) MCG/ACT inhaler Inhale 2 puffs into the lungs every 4 (four) hours as needed for  wheezing or shortness of breath (coughing fits). (Patient not taking: Reported on 02/07/2022) 18 g 1  . Alcohol Swabs (B-D SINGLE USE SWABS REGULAR) PADS USE THREE TIMES DAILY 300 each 3  . allopurinol (ZYLOPRIM) 300 MG tablet TAKE 1 TABLET BY MOUTH DAILY 100 tablet 2  . ammonium lactate (LAC-HYDRIN) 12 % lotion APPLY 1 APPLICATION TOPICALLY TWICE DAILY AS NEEDED FOR DRY SKIN (SUBSTITUTED FOR LAC HYDRIN) 400 g 12  . aspirin EC 81 MG tablet Take 81 mg by mouth daily.    [name] atorvastatin (LIPITOR) 40 MG tablet TAKE 1 TABLET BY MOUTH DAILY 100 tablet 2  . B-D ULTRAFINE III SHORT PEN 31G X 8 MM MISC USE TO INJECT THREE TIMES A DAY 90 each 3  . Blood Glucose Monitoring Suppl (ACCU-CHEK GUIDE) w/Device KIT USE TO CHECK BLOOD SUGAR 3 TIMES DAILY 1 kit 0  . Brimonidine Tartrate-Timolol (COMBIGAN OP) Combigan    . budesonide-formoterol (SYMBICORT) 160-4.5 MCG/ACT inhaler Inhale 2 puffs into the lungs in the morning and at bedtime. with spacer and rinse mouth afterwards. 30.6 g 2  . buPROPion (WELLBUTRIN) 100 MG tablet Take 1 tablet (100 mg total) by mouth 2 (two) times daily. 180 tablet 1  . COMBIGAN 0.2-0.5 % ophthalmic solution INSTILL 1 DROP INTO THE  LEFT EYE EVERY 12 HOURS 5 mL 12  . Dulaglutide (TRULICITY) 1.5 ZO/1.0RU SOPN INJECT THE CONTENTS OF ONE  PEN SUBCUTANEOUSLY WEEKLY  AS DIRECTED 6 mL 3  . DULoxetine (CYMBALTA) 60 MG capsule Take 1 capsule (60 mg total) by mouth daily. 90 capsule 1  . Evolocumab (REPATHA SURECLICK) 045 MG/ML SOAJ Inject 140 mg into the skin every 14 (fourteen) days. 6 mL 3  . famciclovir (FAMVIR) 500 MG tablet Take 1 tablet (500 mg total) by mouth 3 (three) times daily. 21 tablet 0  . famotidine (PEPCID) 40 MG tablet TAKE 1 TABLET BY MOUTH DAILY 90 tablet 1  . fluticasone (FLONASE) 50 MCG/ACT nasal spray Place  1 spray into both nostrils in the morning and at bedtime. 48 g 2  . furosemide (LASIX) 40 MG tablet Take one tablet by mouth daily 90 tablet 3  . glucose blood (ACCU-CHEK GUIDE) test strip Use to check blood sugars 3x per day. E11.9 100 each 12  . insulin glargine (LANTUS SOLOSTAR) 100 UNIT/ML Solostar Pen Inject 35 units subcutaneously once daily as directed 75 mL 3  . insulin lispro (HUMALOG KWIKPEN) 100 UNIT/ML KwikPen Inject 20 Units into the skin daily after supper. 225 mL 3  . Insulin Syringe-Needle U-100 (BD INSULIN SYRINGE ULTRAFINE) 31G X 15/64" 0.5 ML MISC Use to inject three times a day 300 each 2  . irbesartan (AVAPRO) 300 MG tablet Take 1 tablet (300 mg total) by mouth daily. 90 tablet 3  . latanoprost (XALATAN) 0.005 % ophthalmic solution Place 1 drop into both eyes at bedtime. 2.5 mL 3  . levocetirizine (XYZAL) 5 MG tablet Take 1 tablet (5 mg total) by mouth every evening. 30 tablet 0  . LINZESS 290 MCG CAPS capsule Take 1 capsule (290 mcg total) by mouth daily as needed. 30 capsule   . metoCLOPramide (REGLAN) 10 MG tablet Take 20 mg by mouth at bedtime.    . metoprolol succinate (TOPROL-XL) 50 MG 24 hr tablet Take one tablet at bedtime for blood pressure 270 tablet 3  . mometasone (ELOCON) 0.1 % cream Apply to affected area daily as needed do not use on face (Patient not taking: Reported on 01/03/2022) 45 g 5  .  montelukast (SINGULAIR) 10 MG tablet TAKE 1 TABLET BY MOUTH DAILY 90 tablet 1  . Multiple Vitamins-Minerals (MULTIVITAMIN PO) Take 1 tablet by mouth daily.    . RESTASIS MULTIDOSE 0.05 % ophthalmic emulsion Place 1 drop into both eyes daily as needed.  3  . spironolactone (ALDACTONE) 25 MG tablet TAKE 1 TABLET BY MOUTH  DAILY BEFORE SUPPER 90 tablet 3   No current facility-administered medications for this visit.   Allergies: Allergies  Allergen Reactions  . Bee Venom Hives and Swelling  . Propoxyphene Hcl Itching  . Amlodipine Besylate Swelling  . Hydrocodone Other  (See Comments)    With Vicodin - makes patient "jittery", and "hangover effect - sleepy next day"  . Lisinopril Cough    Changed to ARB  . Metformin And Related Diarrhea    GI distress  . Metronidazole Other (See Comments)     "just didn't work; my body never did heal from it"  . Valsartan Other (See Comments)      "sleep more the next day after I took it; it made me real tired"  . Sulfasalazine Other (See Comments)    Other reaction(s): Unknown  . Xyzal [Levocetirizine Dihydrochloride] Rash   I reviewed her past medical history, social history, family history, and environmental history and no significant changes have been reported from her previous visit.  Review of Systems  Constitutional:  Negative for appetite change, chills, fever and unexpected weight change.  HENT:  Negative for congestion and rhinorrhea.   Eyes:  Negative for itching.  Respiratory:  Negative for cough, chest tightness, shortness of breath and wheezing.   Gastrointestinal:  Negative for abdominal pain.  Skin:  Negative for rash.  Allergic/Immunologic: Positive for environmental allergies. Negative for food allergies.  Neurological:  Negative for headaches.   Objective: There were no vitals taken for this visit. There is no height or weight on file to calculate BMI. Physical Exam Vitals and nursing note reviewed.  Constitutional:      Appearance: She is well-developed.  HENT:     Head: Normocephalic and atraumatic.     Right Ear: External ear normal.     Left Ear: External ear normal.     Nose: Nose normal.  Eyes:     Conjunctiva/sclera: Conjunctivae normal.  Cardiovascular:     Rate and Rhythm: Normal rate and regular rhythm.     Heart sounds: Normal heart sounds. No murmur heard.    No friction rub. No gallop.  Pulmonary:     Effort: Pulmonary effort is normal.     Breath sounds: Normal breath sounds. No wheezing or rales.  Musculoskeletal:     Cervical back: Neck supple.  Skin:    General:  Skin is warm.     Findings: No rash.  Neurological:     Mental Status: She is alert and oriented to person, place, and time.  Psychiatric:        Behavior: Behavior normal.  Previous notes and tests were reviewed. The plan was reviewed with the patient/family, and all questions/concerned were addressed.  It was my pleasure to see [name] [name] today and participate in her care. Please feel free to contact me with any questions or concerns.  Sincerely,  Rexene Alberts, DO Allergy & Immunology  [facility] office: [place] office: [phone]

## 2022-04-17 ENCOUNTER — Ambulatory Visit: Payer: Commercial Managed Care - HMO | Admitting: Allergy

## 2022-04-17 DIAGNOSIS — J302 Other seasonal allergic rhinitis: Secondary | ICD-10-CM

## 2022-04-17 DIAGNOSIS — J4540 Moderate persistent asthma, uncomplicated: Secondary | ICD-10-CM

## 2022-04-17 DIAGNOSIS — K219 Gastro-esophageal reflux disease without esophagitis: Secondary | ICD-10-CM

## 2022-04-17 DIAGNOSIS — Z91030 Bee allergy status: Secondary | ICD-10-CM

## 2022-04-19 DIAGNOSIS — G4733 Obstructive sleep apnea (adult) (pediatric): Secondary | ICD-10-CM | POA: Diagnosis not present

## 2022-04-23 NOTE — Progress Notes (Unsigned)
Patient ID: [name] [name]                 DOB: 29-Apr-1951                    MRN: [id number]     HPI: [name] [name] is a 71 y.o. female patient referred to lipid clinic by Dr. Harrington Challenger. PMH is significant for HTN, HLD, T2DM (A1c 6.3%), GERD, gout, and OSA on CPAP. Patient followed by Dr. Harrington Challenger for HTN and HLD since 2013. Has been taking atorvastatin 40 mg daily and ezetimibe 10 mg daily since Jan 2018.  At 12/20/21 visit with PharmD both patients lipids and HTN were addressed. Home cuff was found to be accurate. Pt was started on irbesartan 138m daily. She was also started on Repatha 145mq14 days. At follow up on 01/16/22 pt shared her BP machine kept giving her an error message but planned to get new cuff if could not get it to work. BP was 141/73 in clinic, BMET was stable so irbesartan increased to 300 mg daily on 01/17/22. Pt missed planned follow up 01/31/22. Pt seen by PeNicholas LosePharmD on 02/07/22 for medication management and BP was 114/56. Pt called with lipid results 03/28/22, informed LDL at goal and to continue atorvastatin 40 mg daily and Repatha 140 mg q14days.  Check bmet again to see labs on higher dose of irbesartan?  Continue current meds if tolerating  See if pt got new cuff and what bps look like at home   Current Lipid Medications:  Atorvastatin 40 mg daily Repatha 14075m 14 days  Current HTN Medications: Metoprolol succinate 59m86mily Spironolactone 25mg57mly Furosemide 40mg 67my Irbesartan 300 mg daily  Intolerances: valsartan (sleepy) Risk Factors: HTN, DM, OSA LDL goal: <70 mg/dL  Diet: lost weight by eating less/one meal a day Coffee with a lot of cream and sugar Lunch (1-2p): chicken, green beans/cabbage, collard greens & rice or mac n cheese Doesn't eat dinner unless she's out and gets a hot dog and french fries ~once a week Doesn't eat anything after 6pm  Exercise: ambulates with a walker when traveling. Has trouble getting up the stairs.  Used to go on walks but has history of falls   Family History: Cancer in her sister; Congestive Heart Failure in her brother; Diabetes in her father; Heart disease in her mother; Hyperlipidemia in her brother and sister; Hypertension in her brother, sister, and sister.   Social History: Prior tobacco use (0.25 pack-year history, quit 1977). Denies alcohol or illicit drug use.  Labs: 03/27/2022: TC 71, TG 100, HDL 34, LDL 18, apoB 31 (atorvastatin 40 mg, Repatha 140 mg [place] q2weeks)  11/13/2021: TC 135, TG 97, HDL 39, LDL 78 (atorvastatin 40 mg, ezetimibe 10 mg) 11/12/2019: TC 118, TG 103, HDL 36, LDL 63 (atorvastatin 40 mg, ezetimibe 10 mg) 11/27/2016: TC 121, TG 99, HDL 28, LDL 73 (atorvastatin 40 mg, ezetimibe 10 mg)   Past Medical History:  Diagnosis Date   Allergy    Angina    Anxiety    Arthritis    Asthma    Blood transfusion    Blood transfusion without reported diagnosis    Cataract    Chronic cough    Now followed by pulmonary   Chronic lower back pain    Complication of anesthesia    "just wake up coughing; that's all"   Concussion 11/18/11   "fell at dr's office; hit head"   COPD (  chronic obstructive pulmonary disease) ([place])    Delayed gastric emptying    Depression    Diabetes mellitus type II    "take insulin & pills"   Dyslipidemia    Exertional dyspnea    GERD (gastroesophageal reflux disease)    Glaucoma    Gout    Headache(784.0) 11/18/11   "I've had mild headaches the last couple days"   Headache(784.0) 01/08/12   "pretty constant since 11/18/11's concussion"   Heart murmur    History of colonoscopy    HTN (hypertension)    Myocardial infarction ([place])    Osteoporosis    Peripheral neuropathy    Pneumonia    "i've had it once" (11/18/11)   Sleep apnea    on CPAP 12   Syncope 06/16/2012   Vocal cord paralysis, unilateral partial    "right"    Current Outpatient Medications on File Prior to Visit  Medication Sig Dispense Refill   Accu-Chek FastClix  Lancets MISC Use to check blood sugars 3x per day. E11.9 306 each 12   albuterol (VENTOLIN HFA) 108 (90 Base) MCG/ACT inhaler Inhale 2 puffs into the lungs every 4 (four) hours as needed for wheezing or shortness of breath (coughing fits). (Patient not taking: Reported on 02/07/2022) 18 g 1   Alcohol Swabs (B-D SINGLE USE SWABS REGULAR) PADS USE THREE TIMES DAILY 300 each 3   allopurinol (ZYLOPRIM) 300 MG tablet TAKE 1 TABLET BY MOUTH DAILY 100 tablet 2   ammonium lactate (LAC-HYDRIN) 12 % lotion APPLY 1 APPLICATION TOPICALLY TWICE DAILY AS NEEDED FOR DRY SKIN (SUBSTITUTED FOR LAC HYDRIN) 400 g 12   aspirin EC 81 MG tablet Take 81 mg by mouth daily.     atorvastatin (LIPITOR) 40 MG tablet TAKE 1 TABLET BY MOUTH DAILY 100 tablet 2   B-D ULTRAFINE III SHORT PEN 31G X 8 MM MISC USE TO INJECT THREE TIMES A DAY 90 each 3   Blood Glucose Monitoring Suppl (ACCU-CHEK GUIDE) w/Device KIT USE TO CHECK BLOOD SUGAR 3 TIMES DAILY 1 kit 0   Brimonidine Tartrate-Timolol (COMBIGAN OP) Combigan     budesonide-formoterol (SYMBICORT) 160-4.5 MCG/ACT inhaler Inhale 2 puffs into the lungs in the morning and at bedtime. with spacer and rinse mouth afterwards. 30.6 g 2   buPROPion (WELLBUTRIN) 100 MG tablet Take 1 tablet (100 mg total) by mouth 2 (two) times daily. 180 tablet 1   COMBIGAN 0.2-0.5 % ophthalmic solution INSTILL 1 DROP INTO THE  LEFT EYE EVERY 12 HOURS 5 mL 12   Dulaglutide (TRULICITY) 1.5 JS/9.7WY SOPN INJECT THE CONTENTS OF ONE  PEN SUBCUTANEOUSLY WEEKLY  AS DIRECTED 6 mL 3   DULoxetine (CYMBALTA) 60 MG capsule Take 1 capsule (60 mg total) by mouth daily. 90 capsule 1   Evolocumab (REPATHA SURECLICK) 637 MG/ML SOAJ Inject 140 mg into the skin every 14 (fourteen) days. 6 mL 3   famciclovir (FAMVIR) 500 MG tablet Take 1 tablet (500 mg total) by mouth 3 (three) times daily. 21 tablet 0   famotidine (PEPCID) 40 MG tablet TAKE 1 TABLET BY MOUTH DAILY 90 tablet 1   fluticasone (FLONASE) 50 MCG/ACT nasal spray  Place 1 spray into both nostrils in the morning and at bedtime. 48 g 2   furosemide (LASIX) 40 MG tablet Take one tablet by mouth daily 90 tablet 3   glucose blood (ACCU-CHEK GUIDE) test strip Use to check blood sugars 3x per day. E11.9 100 each 12   insulin glargine (LANTUS SOLOSTAR) 100 UNIT/ML  Solostar Pen Inject 35 units subcutaneously once daily as directed 75 mL 3   insulin lispro (HUMALOG KWIKPEN) 100 UNIT/ML KwikPen Inject 20 Units into the skin daily after supper. 225 mL 3   Insulin Syringe-Needle U-100 (BD INSULIN SYRINGE ULTRAFINE) 31G X 15/64" 0.5 ML MISC Use to inject three times a day 300 each 2   irbesartan (AVAPRO) 300 MG tablet Take 1 tablet (300 mg total) by mouth daily. 90 tablet 3   latanoprost (XALATAN) 0.005 % ophthalmic solution Place 1 drop into both eyes at bedtime. 2.5 mL 3   levocetirizine (XYZAL) 5 MG tablet Take 1 tablet (5 mg total) by mouth every evening. 30 tablet 0   LINZESS 290 MCG CAPS capsule Take 1 capsule (290 mcg total) by mouth daily as needed. 30 capsule    metoCLOPramide (REGLAN) 10 MG tablet Take 20 mg by mouth at bedtime.     metoprolol succinate (TOPROL-XL) 50 MG 24 hr tablet Take one tablet at bedtime for blood pressure 270 tablet 3   mometasone (ELOCON) 0.1 % cream Apply to affected area daily as needed do not use on face (Patient not taking: Reported on 01/03/2022) 45 g 5   montelukast (SINGULAIR) 10 MG tablet TAKE 1 TABLET BY MOUTH DAILY 90 tablet 1   Multiple Vitamins-Minerals (MULTIVITAMIN PO) Take 1 tablet by mouth daily.     RESTASIS MULTIDOSE 0.05 % ophthalmic emulsion Place 1 drop into both eyes daily as needed.  3   spironolactone (ALDACTONE) 25 MG tablet TAKE 1 TABLET BY MOUTH  DAILY BEFORE SUPPER 90 tablet 3   No current facility-administered medications on file prior to visit.    Allergies  Allergen Reactions   Bee Venom Hives and Swelling   Propoxyphene Hcl Itching   Amlodipine Besylate Swelling   Hydrocodone Other (See Comments)     With Vicodin - makes patient "jittery", and "hangover effect - sleepy next day"   Lisinopril Cough    Changed to ARB   Metformin And Related Diarrhea    GI distress   Metronidazole Other (See Comments)     "just didn't work; my body never did heal from it"   Valsartan Other (See Comments)      "sleep more the next day after I took it; it made me real tired"   Sulfasalazine Other (See Comments)    Other reaction(s): Unknown   Xyzal [Levocetirizine Dihydrochloride] Rash    Assessment/Plan:  1. Hypertension -   Thank you,  Eliseo Gum, PharmD PGY1 Pharmacy Resident   04/23/2022  2:38 PM   Ramond Dial, Pharm.D, BCPS, CPP [facility]  [address] [place], [place] [address]  Phone: [phone]; Fax: [phone]

## 2022-04-24 ENCOUNTER — Ambulatory Visit: Payer: Medicare Other

## 2022-04-25 DIAGNOSIS — L84 Corns and callosities: Secondary | ICD-10-CM | POA: Diagnosis not present

## 2022-04-25 DIAGNOSIS — E1142 Type 2 diabetes mellitus with diabetic polyneuropathy: Secondary | ICD-10-CM | POA: Diagnosis not present

## 2022-04-30 ENCOUNTER — Other Ambulatory Visit (HOSPITAL_COMMUNITY): Payer: Self-pay | Admitting: Psychiatry

## 2022-04-30 DIAGNOSIS — F330 Major depressive disorder, recurrent, mild: Secondary | ICD-10-CM

## 2022-04-30 DIAGNOSIS — F411 Generalized anxiety disorder: Secondary | ICD-10-CM

## 2022-04-30 NOTE — Progress Notes (Deleted)
Follow Up Note  RE: [name] [name] MRN: [id number] DOB: 1951/01/12 Date of Office Visit: 05/01/2022  Referring provider: Dickie La, MD Primary care provider: Dickie La, MD  Chief Complaint: No chief complaint on file.  History of Present Illness: I had the pleasure of seeing [name] [name] for a follow up visit at the [facility] on 04/30/2022. She is a 71 y.o. female, who is being followed for asthma, allergic rhinitis, LPRD and bee sting allergy. Her previous allergy office visit was on 09/05/2021 with Dr. Maudie Mercury. Today is a regular follow up visit.  Moderate persistent asthma without complication Well-controlled and no prednisone use.  Today's spirometry was unremarkable. Daily controller medication(s): continue Symbicort 143mg 2 puffs twice a day with spacer and rinse mouth afterwards. Continue Singulair 126mdaily  May use albuterol rescue inhaler 2 puffs every 4 to 6 hours as needed for shortness of breath, chest tightness, coughing, and wheezing. May use albuterol rescue inhaler 2 puffs 5 to 15 minutes prior to strenuous physical activities. Monitor frequency of use.  Get spirometry at next visit.   Seasonal and perennial allergic rhinitis Past history - 2018 skin testing was positive to dust mites and tree pollen. Interim history - controlled. Continue environmental control measures. Use Flonase (fluticasone) nasal spray 1 spray per nostril twice a day as needed for nasal congestion.  Nasal saline spray (i.e., Simply Saline) or nasal saline lavage (i.e., NeilMed) is recommended as needed and prior to medicated nasal sprays. May use over the counter antihistamines such as Zyrtec (cetirizine), Claritin (loratadine), Allegra (fexofenadine), or Xyzal (levocetirizine) daily as needed.   LPRD (laryngopharyngeal reflux disease) Stable.  Continue Nexium 4024maily. Continue pepcid 96m22mily.     Bee sting allergy No stings since last visit.  For  mild symptoms you can take over the counter antihistamines such as Benadryl and monitor symptoms closely. If symptoms worsen or if you have severe symptoms including breathing issues, throat closure, significant swelling, whole body hives, severe diarrhea and vomiting, lightheadedness then inject epinephrine and seek immediate medical care afterwards. Consider testing in future.   Return in about 6 months (around 03/05/2022).  Assessment and Plan: [name] [name] 70 y42. female with: No problem-specific Assessment & Plan notes found for this encounter.  No follow-ups on file.  No orders of the defined types were placed in this encounter.  Lab Orders  No laboratory test(s) ordered today    Diagnostics: Spirometry:  Tracings reviewed. Her effort: {Blank single:19197::"Good reproducible efforts.","It was hard to get consistent efforts and there is a question as to whether this reflects a maximal maneuver.","Poor effort, data can not be interpreted."} FVC: ***L FEV1: ***L, ***% predicted FEV1/FVC ratio: ***% Interpretation: {Blank single:19197::"Spirometry consistent with mild obstructive disease","Spirometry consistent with moderate obstructive disease","Spirometry consistent with severe obstructive disease","Spirometry consistent with possible restrictive disease","Spirometry consistent with mixed obstructive and restrictive disease","Spirometry uninterpretable due to technique","Spirometry consistent with normal pattern","No overt abnormalities noted given today's efforts"}.  Please see scanned spirometry results for details.  Skin Testing: {Blank single:19197::"Select foods","Environmental allergy panel","Environmental allergy panel and select foods","Food allergy panel","None","Deferred due to recent antihistamines use"}. *** Results discussed with patient/family.   Medication List:  Current Outpatient Medications  Medication Sig Dispense Refill  . Accu-Chek FastClix Lancets MISC Use  to check blood sugars 3x per day. E11.9 306 each 12  . albuterol (VENTOLIN HFA) 108 (90 Base) MCG/ACT inhaler Inhale 2 puffs into the lungs every 4 (four) hours as needed  for wheezing or shortness of breath (coughing fits). (Patient not taking: Reported on 02/07/2022) 18 g 1  . Alcohol Swabs (B-D SINGLE USE SWABS REGULAR) PADS USE THREE TIMES DAILY 300 each 3  . allopurinol (ZYLOPRIM) 300 MG tablet TAKE 1 TABLET BY MOUTH DAILY 100 tablet 2  . ammonium lactate (LAC-HYDRIN) 12 % lotion APPLY 1 APPLICATION TOPICALLY TWICE DAILY AS NEEDED FOR DRY SKIN (SUBSTITUTED FOR LAC HYDRIN) 400 g 12  . aspirin EC 81 MG tablet Take 81 mg by mouth daily.    [name] atorvastatin (LIPITOR) 40 MG tablet TAKE 1 TABLET BY MOUTH DAILY 100 tablet 2  . B-D ULTRAFINE III SHORT PEN 31G X 8 MM MISC USE TO INJECT THREE TIMES A DAY 90 each 3  . Blood Glucose Monitoring Suppl (ACCU-CHEK GUIDE) w/Device KIT USE TO CHECK BLOOD SUGAR 3 TIMES DAILY 1 kit 0  . Brimonidine Tartrate-Timolol (COMBIGAN OP) Combigan    . budesonide-formoterol (SYMBICORT) 160-4.5 MCG/ACT inhaler Inhale 2 puffs into the lungs in the morning and at bedtime. with spacer and rinse mouth afterwards. 30.6 g 2  . buPROPion (WELLBUTRIN) 100 MG tablet Take 1 tablet (100 mg total) by mouth 2 (two) times daily. 180 tablet 1  . COMBIGAN 0.2-0.5 % ophthalmic solution INSTILL 1 DROP INTO THE  LEFT EYE EVERY 12 HOURS 5 mL 12  . Dulaglutide (TRULICITY) 1.5 NG/2.9BM SOPN INJECT THE CONTENTS OF ONE  PEN SUBCUTANEOUSLY WEEKLY  AS DIRECTED 6 mL 3  . DULoxetine (CYMBALTA) 60 MG capsule Take 1 capsule (60 mg total) by mouth daily. 90 capsule 1  . Evolocumab (REPATHA SURECLICK) 841 MG/ML SOAJ Inject 140 mg into the skin every 14 (fourteen) days. 6 mL 3  . famciclovir (FAMVIR) 500 MG tablet Take 1 tablet (500 mg total) by mouth 3 (three) times daily. 21 tablet 0  . famotidine (PEPCID) 40 MG tablet TAKE 1 TABLET BY MOUTH DAILY 90 tablet 1  . fluticasone (FLONASE) 50 MCG/ACT nasal spray  Place 1 spray into both nostrils in the morning and at bedtime. 48 g 2  . furosemide (LASIX) 40 MG tablet Take one tablet by mouth daily 90 tablet 3  . glucose blood (ACCU-CHEK GUIDE) test strip Use to check blood sugars 3x per day. E11.9 100 each 12  . insulin glargine (LANTUS SOLOSTAR) 100 UNIT/ML Solostar Pen Inject 35 units subcutaneously once daily as directed 75 mL 3  . insulin lispro (HUMALOG KWIKPEN) 100 UNIT/ML KwikPen Inject 20 Units into the skin daily after supper. 225 mL 3  . Insulin Syringe-Needle U-100 (BD INSULIN SYRINGE ULTRAFINE) 31G X 15/64" 0.5 ML MISC Use to inject three times a day 300 each 2  . irbesartan (AVAPRO) 300 MG tablet Take 1 tablet (300 mg total) by mouth daily. 90 tablet 3  . latanoprost (XALATAN) 0.005 % ophthalmic solution Place 1 drop into both eyes at bedtime. 2.5 mL 3  . levocetirizine (XYZAL) 5 MG tablet Take 1 tablet (5 mg total) by mouth every evening. 30 tablet 0  . LINZESS 290 MCG CAPS capsule Take 1 capsule (290 mcg total) by mouth daily as needed. 30 capsule   . metoCLOPramide (REGLAN) 10 MG tablet Take 20 mg by mouth at bedtime.    . metoprolol succinate (TOPROL-XL) 50 MG 24 hr tablet Take one tablet at bedtime for blood pressure 270 tablet 3  . mometasone (ELOCON) 0.1 % cream Apply to affected area daily as needed do not use on face (Patient not taking: Reported on 01/03/2022) 45 g  5  . montelukast (SINGULAIR) 10 MG tablet TAKE 1 TABLET BY MOUTH DAILY 90 tablet 1  . Multiple Vitamins-Minerals (MULTIVITAMIN PO) Take 1 tablet by mouth daily.    . RESTASIS MULTIDOSE 0.05 % ophthalmic emulsion Place 1 drop into both eyes daily as needed.  3  . spironolactone (ALDACTONE) 25 MG tablet TAKE 1 TABLET BY MOUTH  DAILY BEFORE SUPPER 90 tablet 3   No current facility-administered medications for this visit.   Allergies: Allergies  Allergen Reactions  . Bee Venom Hives and Swelling  . Propoxyphene Hcl Itching  . Amlodipine Besylate Swelling  . Hydrocodone  Other (See Comments)    With Vicodin - makes patient "jittery", and "hangover effect - sleepy next day"  . Lisinopril Cough    Changed to ARB  . Metformin And Related Diarrhea    GI distress  . Metronidazole Other (See Comments)     "just didn't work; my body never did heal from it"  . Valsartan Other (See Comments)      "sleep more the next day after I took it; it made me real tired"  . Sulfasalazine Other (See Comments)    Other reaction(s): Unknown  . Xyzal [Levocetirizine Dihydrochloride] Rash   I reviewed her past medical history, social history, family history, and environmental history and no significant changes have been reported from her previous visit.  Review of Systems  Constitutional:  Negative for appetite change, chills, fever and unexpected weight change.  HENT:  Negative for congestion and rhinorrhea.   Eyes:  Negative for itching.  Respiratory:  Negative for cough, chest tightness, shortness of breath and wheezing.   Gastrointestinal:  Negative for abdominal pain.  Skin:  Negative for rash.  Allergic/Immunologic: Positive for environmental allergies. Negative for food allergies.  Neurological:  Negative for headaches.   Objective: There were no vitals taken for this visit. There is no height or weight on file to calculate BMI. Physical Exam Vitals and nursing note reviewed.  Constitutional:      Appearance: She is well-developed.  HENT:     Head: Normocephalic and atraumatic.     Right Ear: External ear normal.     Left Ear: External ear normal.     Nose: Nose normal.  Eyes:     Conjunctiva/sclera: Conjunctivae normal.  Cardiovascular:     Rate and Rhythm: Normal rate and regular rhythm.     Heart sounds: Normal heart sounds. No murmur heard.    No friction rub. No gallop.  Pulmonary:     Effort: Pulmonary effort is normal.     Breath sounds: Normal breath sounds. No wheezing or rales.  Musculoskeletal:     Cervical back: Neck supple.  Skin:     General: Skin is warm.     Findings: No rash.  Neurological:     Mental Status: She is alert and oriented to person, place, and time.  Psychiatric:        Behavior: Behavior normal.  Previous notes and tests were reviewed. The plan was reviewed with the patient/family, and all questions/concerned were addressed.  It was my pleasure to see Evellyn today and participate in her care. Please feel free to contact me with any questions or concerns.  Sincerely,  Rexene Alberts, DO Allergy & Immunology  [facility] office: [place] office: [phone]

## 2022-05-01 ENCOUNTER — Ambulatory Visit: Payer: Commercial Managed Care - HMO | Admitting: Allergy

## 2022-05-01 DIAGNOSIS — K219 Gastro-esophageal reflux disease without esophagitis: Secondary | ICD-10-CM

## 2022-05-01 DIAGNOSIS — J302 Other seasonal allergic rhinitis: Secondary | ICD-10-CM

## 2022-05-01 DIAGNOSIS — J4540 Moderate persistent asthma, uncomplicated: Secondary | ICD-10-CM

## 2022-05-01 DIAGNOSIS — Z91030 Bee allergy status: Secondary | ICD-10-CM

## 2022-05-02 ENCOUNTER — Other Ambulatory Visit: Payer: Self-pay | Admitting: Allergy

## 2022-05-09 ENCOUNTER — Other Ambulatory Visit: Payer: Self-pay | Admitting: Allergy

## 2022-05-09 ENCOUNTER — Other Ambulatory Visit: Payer: Self-pay | Admitting: Family Medicine

## 2022-05-15 ENCOUNTER — Other Ambulatory Visit (HOSPITAL_COMMUNITY): Payer: Self-pay | Admitting: Psychiatry

## 2022-05-15 ENCOUNTER — Other Ambulatory Visit: Payer: Self-pay | Admitting: Allergy

## 2022-05-15 ENCOUNTER — Other Ambulatory Visit: Payer: Self-pay | Admitting: Family Medicine

## 2022-05-15 DIAGNOSIS — E1149 Type 2 diabetes mellitus with other diabetic neurological complication: Secondary | ICD-10-CM

## 2022-05-15 DIAGNOSIS — F330 Major depressive disorder, recurrent, mild: Secondary | ICD-10-CM

## 2022-05-15 DIAGNOSIS — F411 Generalized anxiety disorder: Secondary | ICD-10-CM

## 2022-05-16 ENCOUNTER — Telehealth (HOSPITAL_BASED_OUTPATIENT_CLINIC_OR_DEPARTMENT_OTHER): Payer: Medicare Other | Admitting: Psychiatry

## 2022-05-16 DIAGNOSIS — F411 Generalized anxiety disorder: Secondary | ICD-10-CM

## 2022-05-16 DIAGNOSIS — F330 Major depressive disorder, recurrent, mild: Secondary | ICD-10-CM

## 2022-05-16 DIAGNOSIS — H401131 Primary open-angle glaucoma, bilateral, mild stage: Secondary | ICD-10-CM | POA: Diagnosis not present

## 2022-05-16 MED ORDER — BUPROPION HCL 100 MG PO TABS: 100.0000 mg | ORAL_TABLET | Freq: Two times a day (BID) | ORAL | 1 refills | Status: DC

## 2022-05-16 MED ORDER — DULOXETINE HCL 60 MG PO CPEP: 60.0000 mg | ORAL_CAPSULE | Freq: Every day | ORAL | 1 refills | Status: DC

## 2022-05-16 NOTE — Progress Notes (Signed)
Virtual Visit via Telephone Note  I connected with [name] [name] on 05/16/22 at  4:00 PM EDT by telephone and verified that I am speaking with the correct person using two identifiers. [name] [name] does not have Internet or a Stage manager.  Location: Patient: home Provider: office   I discussed the limitations, risks, security and privacy concerns of performing an evaluation and management service by telephone and the availability of in person appointments. I also discussed with the patient that there may be a patient responsible charge related to this service. The patient expressed understanding and agreed to proceed.   History of Present Illness: [name] [name] is doing well. Her bed bug problem has improved. Her health is fine. She has been volunteering at her [place] twice/week. She spends the rest of her time watching tv or sleeping. She does not drive or cook because of her eye sight. She eats church left overs and has meals on wheels. [name] [name] denies depression, isolation and anhedonia. She denies SI/HI. Her sleep is good. Her energy is "so/so". She often naps during the day. [name] [name] states her anxiety is stable. It is mostly situational and manageable when it occurs.    Observations/Objective:  General Appearance: unable to assess  Eye Contact:  unable to assess  Speech:  Clear and Coherent and Normal Rate  Volume:  Normal  Mood:  Euthymic  Affect:  Full Range  Thought Process:  Goal Directed, Linear, and Descriptions of Associations: Intact  Orientation:  Full (Time, Place, and Person)  Thought Content:  Logical  Suicidal Thoughts:  No  Homicidal Thoughts:  No  Memory:  Immediate;   Good  Judgement:  Good  Insight:  Good  Psychomotor Activity: unable to assess  Concentration:  Concentration: Good  Recall:  Good  Fund of Knowledge:  Good  Language:  Good  Akathisia:  unable to assess  Handed:  unable to assess  AIMS (if indicated):     Assets:  Communication  Skills Desire for Improvement Financial Resources/Insurance Housing Resilience Social Support Talents/Skills Vocational/Educational  ADL's:  unable to assess  Cognition:  WNL  Sleep:         05/16/2022    4:12 PM 01/03/2022    4:17 PM 12/19/2021   11:05 AM 10/04/2021    4:38 PM 08/01/2021   10:42 AM  Depression screen PHQ 2/9  Decreased Interest 0 0 0 0 0  Down, Depressed, Hopeless 0 0 0 0 0  PHQ - 2 Score 0 0 0 0 0  Altered sleeping  0 0    Tired, decreased energy  0 0    Change in appetite  3 0    Feeling bad or failure about yourself   0 0    Trouble concentrating  0 0    Moving slowly or fidgety/restless  0 0    Suicidal thoughts  0 0    PHQ-9 Score  3 0    Difficult doing work/chores   Not difficult at all      Flowsheet Row Video Visit from 05/16/2022 in [facility] Video Visit from 01/03/2022 in [facility] Video Visit from 10/04/2021 in [facility] No Risk No Risk No Risk        Assessment and Plan: 1. GAD (generalized anxiety disorder) - buPROPion (WELLBUTRIN) 100 MG tablet; Take 1 tablet (100 mg total) by mouth 2 (two) times daily.  Dispense: 180 tablet; Refill: 1 -  DULoxetine (CYMBALTA) 60 MG capsule; Take 1 capsule (60 mg total) by mouth daily.  Dispense: 90 capsule; Refill: 1  2. Mild episode of recurrent major depressive disorder (HCC) - buPROPion (WELLBUTRIN) 100 MG tablet; Take 1 tablet (100 mg total) by mouth 2 (two) times daily.  Dispense: 180 tablet; Refill: 1 - DULoxetine (CYMBALTA) 60 MG capsule; Take 1 capsule (60 mg total) by mouth daily.  Dispense: 90 capsule; Refill: 1    Follow Up Instructions: In person visit in 5-6 months or sooner if needed   I discussed the assessment and treatment plan with the patient. The patient was provided an opportunity to ask questions and all were answered. The patient agreed  with the plan and demonstrated an understanding of the instructions.   The patient was advised to call back or seek an in-person evaluation if the symptoms worsen or if the condition fails to improve as anticipated.  I provided 13 minutes of non-face-to-face time during this encounter.   Charlcie Cradle, MD

## 2022-05-23 ENCOUNTER — Other Ambulatory Visit: Payer: Self-pay | Admitting: *Deleted

## 2022-05-23 MED ORDER — ALBUTEROL SULFATE HFA 108 (90 BASE) MCG/ACT IN AERS: 2.0000 | INHALATION_SPRAY | RESPIRATORY_TRACT | 1 refills | Status: DC | PRN

## 2022-05-23 MED ORDER — ALBUTEROL SULFATE HFA 108 (90 BASE) MCG/ACT IN AERS: 2.0000 | INHALATION_SPRAY | RESPIRATORY_TRACT | 5 refills | Status: DC | PRN

## 2022-05-23 NOTE — Telephone Encounter (Signed)
Per [facility] Ventolin HFA is not covered by patient's insurance.  Covered alternative: ProAir HFA or Proventil HFA  Resent medication refill to for Proventil HFA to [facility].

## 2022-05-23 NOTE — Addendum Note (Signed)
Addended by: Tommas Olp B on: 05/23/2022 12:40 PM   Modules accepted: Orders

## 2022-05-24 ENCOUNTER — Ambulatory Visit: Payer: Medicare Other

## 2022-06-05 ENCOUNTER — Encounter: Payer: Self-pay | Admitting: Family Medicine

## 2022-06-05 ENCOUNTER — Ambulatory Visit (HOSPITAL_BASED_OUTPATIENT_CLINIC_OR_DEPARTMENT_OTHER): Payer: Medicare Other | Admitting: Family Medicine

## 2022-06-05 VITALS — BP 110/60 | HR 85 | Ht 61.0 in | Wt 175.0 lb

## 2022-06-05 DIAGNOSIS — R269 Unspecified abnormalities of gait and mobility: Secondary | ICD-10-CM

## 2022-06-05 DIAGNOSIS — I10 Essential (primary) hypertension: Secondary | ICD-10-CM

## 2022-06-05 DIAGNOSIS — Z0000 Encounter for general adult medical examination without abnormal findings: Secondary | ICD-10-CM

## 2022-06-05 DIAGNOSIS — H547 Unspecified visual loss: Secondary | ICD-10-CM

## 2022-06-05 DIAGNOSIS — M4322 Fusion of spine, cervical region: Secondary | ICD-10-CM

## 2022-06-05 DIAGNOSIS — E1149 Type 2 diabetes mellitus with other diabetic neurological complication: Secondary | ICD-10-CM

## 2022-06-05 DIAGNOSIS — Z23 Encounter for immunization: Secondary | ICD-10-CM

## 2022-06-05 LAB — POCT GLYCOSYLATED HEMOGLOBIN (HGB A1C): HbA1c, POC (controlled diabetic range): 6.4 % (ref 0.0–7.0)

## 2022-06-05 NOTE — Assessment & Plan Note (Signed)
Reviewed her health maintenance.  She is yet to get the shingles shot and we discussed.  She says she will get hers when I get mine.

## 2022-06-05 NOTE — Assessment & Plan Note (Signed)
I reviewed the sheet from the insurance visit, my house calls, that mentioned they wanted her to discuss falls with PCP.  We discussed today.  She needs to use her walker at all times and she is aware of this.  We will see if I can order her an extension grabber.  One of her recent falls was because she was trying to pick up something from the floor and when she leaned over to get it she toppled over.  We will also order new bedside commode as getting up in the middle the night is a problem for her and her toilet is about 70 years old.

## 2022-06-05 NOTE — Progress Notes (Signed)
    CHIEF COMPLAINT / HPI: #1.  Follow-up diabetes mellitus: Taking her medications on her own schedule as previously.  Not having below episodes of blood sugar.  No increased frequency of urination or thirst.  Weight has been stable. 2.  Hypertension: Taking medicines regularly without problem.  No headaches, no chest pain, no lower extremity edema. 3.  Questions about some lab work that was done by her [facility], specifically her ABIs.  She brings a paper with her showing left ABI 0.7 and right 0.9. 4.  Questions about why she has so much trouble getting blood from her fingertip when she is trying to check her blood sugar.  This has been an ongoing problem.   PERTINENT  PMH / PSH: I have reviewed the patient's medications, allergies, past medical and surgical history, smoking status and updated in the EMR as appropriate.   OBJECTIVE:  BP 110/60   Pulse 85   Ht '5\' 1"'$  (1.549 m)   Wt 175 lb (79.4 kg)   SpO2 95%   BMI 33.07 kg/m  Vital signs reviewed. GENERAL: Well-developed, well-nourished, no acute distress.  Walking with a forward posture using a rolling walker. HEENT: Resting nystagmus intermittently.  Patient is blind bilaterally. CARDIOVASCULAR: Regular rate and rhythm no murmur gallop or rub LUNGS: Clear to auscultation bilaterally, no rales or wheeze. ABDOMEN: Soft positive bowel sounds MSK: Movement of extremity x 4.   ASSESSMENT / PLAN:   Diabetes mellitus type 2 with neurological manifestations [facility]) As has been the case for some time, [name] [name] is following her own plan with her diabetes medicines that seems to be working for her.  A1c was great today.  She will continue.  GAIT IMBALANCE I reviewed the sheet from the insurance visit, my house calls, that mentioned they wanted her to discuss falls with PCP.  We discussed today.  She needs to use her walker at all times and she is aware of this.  We will see if I can order her an extension grabber.  One of her  recent falls was because she was trying to pick up something from the floor and when she leaned over to get it she toppled over.  We will also order new bedside commode as getting up in the middle the night is a problem for her and her toilet is about 70 years old.  HYPERTENSION, BENIGN SYSTEMIC Excellent blood pressure control.  We will continue with current medications and follow-up 3 months.  Reviewed labs.  From her cardiology visit.  Healthcare maintenance Reviewed her health maintenance.  She is yet to get the shingles shot and we discussed.  She says she will get hers when I get mine.   [name] MD

## 2022-06-05 NOTE — Assessment & Plan Note (Signed)
As has been the case for some time, [name] [name] is following her own plan with her diabetes medicines that seems to be working for her.  A1c was great today.  She will continue.

## 2022-06-05 NOTE — Assessment & Plan Note (Signed)
Excellent blood pressure control.  We will continue with current medications and follow-up 3 months.  Reviewed labs.  From her cardiology visit.

## 2022-06-06 NOTE — Progress Notes (Signed)
Follow Up Note  RE: [name] [name] MRN: [id number] DOB: 08/05/1951 Date of Office Visit: 06/07/2022  Referring provider: Dickie La, MD Primary care provider: Dickie La, MD  Chief Complaint: Follow-up  History of Present Illness: I had the pleasure of seeing [name] [name] for a follow up visit at [facility] on 06/08/2022. She is a 71 y.o. female, who is being followed for asthma, allergic rhinitis, LPRD and bee sting allergy. Her previous allergy office visit was on 09/05/2021 with Dr. Maudie Mercury. Today is a regular follow up visit.  Moderate persistent asthma Gets some shortness of breath with going up the stairs.  Currently on Symbicort 167mg 2 puffs twice a day and Singulair daily.  Using albuterol about once per month.  Denies any ER/urgent care visits or prednisone use since the last visit.  Up to date with flu shot.  Seasonal and perennial allergic rhinitis Has some rhinorrhea. Using Flonase as needed now.  Not taking any antihistamines.   LPRD (laryngopharyngeal reflux disease) Controlled with Nexium and Pepcid daily. Unable to wean off.   Bee sting allergy No recent stings.  Assessment and Plan: [name] [name] a 71y.o. female with: Moderate persistent asthma without complication Some DOE. No prednisone use.  Today's spirometry was unremarkable. Daily controller medication(s): continue Symbicort 1629m 2 puffs twice a day with spacer and rinse mouth afterwards. Continue Singulair 1019maily. May use albuterol rescue inhaler 2 puffs every 4 to 6 hours as needed for shortness of breath, chest tightness, coughing, and wheezing. May use albuterol rescue inhaler 2 puffs 5 to 15 minutes prior to strenuous physical activities. Monitor frequency of use.  Get spirometry at next visit.  Seasonal and perennial allergic rhinitis Past history - 2018 skin testing was positive to dust mites and tree pollen. Interim history - rhinorrhea. Continue  environmental control measures. Use Atrovent (ipratropium) 0.03% 1-2 sprays per nostril twice a day as needed for runny nose/drainage. Use Flonase (fluticasone) nasal spray 1 spray per nostril twice a day as needed for nasal congestion.  Nasal saline spray (i.e., Simply Saline) or nasal saline lavage (i.e., NeilMed) is recommended as needed and prior to medicated nasal sprays. May use over the counter antihistamines such as Zyrtec (cetirizine), Claritin (loratadine), Allegra (fexofenadine), or Xyzal (levocetirizine) daily as needed.  LPRD (laryngopharyngeal reflux disease) Stable. Unable to wean off meds.  Continue Nexium 43m71mily. Continue pepcid 43mg51mly.    Bee sting allergy No stings since last visit.  For mild symptoms you can take over the counter antihistamines such as Benadryl and monitor symptoms closely. If symptoms worsen or if you have severe symptoms including breathing issues, throat closure, significant swelling, whole body hives, severe diarrhea and vomiting, lightheadedness then inject epinephrine and seek immediate medical care afterwards. Get bloodwork.  Return in about 6 months (around 12/07/2022).  Meds ordered this encounter  Medications   ipratropium (ATROVENT) 0.03 % nasal spray    Sig: Place 1-2 sprays into both nostrils 2 (two) times daily as needed (nasal drainage).    Dispense:  30 mL    Refill:  3   famotidine (PEPCID) 40 MG tablet    Sig: Take 1 tablet (40 mg total) by mouth daily.    Dispense:  90 tablet    Refill:  3   esomeprazole (NEXIUM) 40 MG capsule    Sig: Take 1 capsule (40 mg total) by mouth daily.    Dispense:  90 capsule  Refill:  3   Lab Orders         Allergen Hymenoptera Panel         Tryptase      Diagnostics: Spirometry:  Tracings reviewed. Her effort: It was hard to get consistent efforts and there is a question as to whether this reflects a maximal maneuver. FVC: 1.80L FEV1: 1.40L, 89% predicted FEV1/FVC ratio:  78% Interpretation: No overt abnormalities noted given today's efforts.  Please see scanned spirometry results for details.  Medication List:  Current Outpatient Medications  Medication Sig Dispense Refill   Accu-Chek FastClix Lancets MISC Use to check blood sugars 3x per day. E11.9 306 each 12   albuterol (PROVENTIL HFA) 108 (90 Base) MCG/ACT inhaler Inhale 2 puffs into the lungs every 4 (four) hours as needed for wheezing or shortness of breath. 1 each 5   Alcohol Swabs (B-D SINGLE USE SWABS REGULAR) PADS USE THREE TIMES DAILY 300 each 3   allopurinol (ZYLOPRIM) 300 MG tablet TAKE 1 TABLET BY MOUTH DAILY 100 tablet 2   ammonium lactate (LAC-HYDRIN) 12 % lotion APPLY 1 APPLICATION TOPICALLY TWICE DAILY AS NEEDED FOR DRY SKIN (SUBSTITUTED FOR LAC HYDRIN) 400 g 12   aspirin EC 81 MG tablet Take 81 mg by mouth daily.     atorvastatin (LIPITOR) 40 MG tablet TAKE 1 TABLET BY MOUTH DAILY 100 tablet 2   B-D ULTRAFINE III SHORT PEN 31G X 8 MM MISC USE TO INJECT THREE TIMES A DAY 90 each 3   Blood Glucose Monitoring Suppl (ACCU-CHEK GUIDE) w/Device KIT USE TO CHECK BLOOD SUGAR 3 TIMES DAILY 1 kit 0   Brimonidine Tartrate-Timolol (COMBIGAN OP) Combigan     budesonide-formoterol (SYMBICORT) 160-4.5 MCG/ACT inhaler Inhale 2 puffs into the lungs in the morning and at bedtime. with spacer and rinse mouth afterwards. 30.6 g 2   buPROPion (WELLBUTRIN) 100 MG tablet Take 1 tablet (100 mg total) by mouth 2 (two) times daily. 180 tablet 1   COMBIGAN 0.2-0.5 % ophthalmic solution INSTILL 1 DROP INTO THE  LEFT EYE EVERY 12 HOURS 5 mL 12   Dulaglutide (TRULICITY) 1.5 PF/7.9KW SOPN INJECT THE CONTENTS OF ONE  PEN SUBCUTANEOUSLY WEEKLY  AS DIRECTED 6 mL 3   DULoxetine (CYMBALTA) 60 MG capsule Take 1 capsule (60 mg total) by mouth daily. 90 capsule 1   esomeprazole (NEXIUM) 40 MG capsule Take 1 capsule (40 mg total) by mouth daily. 90 capsule 3   Evolocumab (REPATHA SURECLICK) 409 MG/ML SOAJ Inject 140 mg into the  skin every 14 (fourteen) days. 6 mL 3   famciclovir (FAMVIR) 500 MG tablet Take 1 tablet (500 mg total) by mouth 3 (three) times daily. 21 tablet 0   famotidine (PEPCID) 40 MG tablet Take 1 tablet (40 mg total) by mouth daily. 90 tablet 3   fluticasone (FLONASE) 50 MCG/ACT nasal spray Place 1 spray into both nostrils in the morning and at bedtime. 48 g 2   furosemide (LASIX) 40 MG tablet Take one tablet by mouth daily 90 tablet 3   glucose blood (ACCU-CHEK GUIDE) test strip Use to check blood sugars 3x per day. E11.9 100 each 12   insulin glargine (LANTUS SOLOSTAR) 100 UNIT/ML Solostar Pen INJECT SUBCUTANEOUSLY 35  UNITS TWICE DAILY 75 mL 2   insulin lispro (HUMALOG KWIKPEN) 100 UNIT/ML KwikPen Inject 20 Units into the skin daily after supper. 225 mL 3   Insulin Syringe-Needle U-100 (BD INSULIN SYRINGE ULTRAFINE) 31G X 15/64" 0.5 ML MISC Use to inject  three times a day 300 each 2   ipratropium (ATROVENT) 0.03 % nasal spray Place 1-2 sprays into both nostrils 2 (two) times daily as needed (nasal drainage). 30 mL 3   irbesartan (AVAPRO) 300 MG tablet Take 1 tablet (300 mg total) by mouth daily. 90 tablet 3   latanoprost (XALATAN) 0.005 % ophthalmic solution Place 1 drop into both eyes at bedtime. 2.5 mL 3   levocetirizine (XYZAL) 5 MG tablet Take 1 tablet (5 mg total) by mouth every evening. 30 tablet 0   LINZESS 290 MCG CAPS capsule Take 1 capsule (290 mcg total) by mouth daily as needed. 30 capsule    metoCLOPramide (REGLAN) 10 MG tablet Take 20 mg by mouth at bedtime.     metoprolol succinate (TOPROL-XL) 50 MG 24 hr tablet TAKE 3 TABLETS BY MOUTH DAILY  WITH OR IMMEDIATELY FOLLOWING A  MEAL 300 tablet 3   mometasone (ELOCON) 0.1 % cream Apply to affected area daily as needed do not use on face 45 g 5   montelukast (SINGULAIR) 10 MG tablet TAKE 1 TABLET BY MOUTH DAILY 90 tablet 1   Multiple Vitamins-Minerals (MULTIVITAMIN PO) Take 1 tablet by mouth daily.     RESTASIS MULTIDOSE 0.05 % ophthalmic  emulsion Place 1 drop into both eyes daily as needed.  3   spironolactone (ALDACTONE) 25 MG tablet TAKE 1 TABLET BY MOUTH DAILY  BEFORE SUPPER 100 tablet 3   TYRVAYA 0.03 MG/ACT SOLN Place into both nostrils.     No current facility-administered medications for this visit.   Allergies: Allergies  Allergen Reactions   Bee Venom Hives and Swelling   Propoxyphene Hcl Itching   Amlodipine Besylate Swelling   Hydrocodone Other (See Comments)    With Vicodin - makes patient "jittery", and "hangover effect - sleepy next day"   Lisinopril Cough    Changed to ARB   Metformin And Related Diarrhea    GI distress   Metronidazole Other (See Comments)     "just didn't work; my body never did heal from it"   Valsartan Other (See Comments)      "sleep more the next day after I took it; it made me real tired"   Sulfasalazine Other (See Comments)    Other reaction(s): Unknown   Xyzal [Levocetirizine Dihydrochloride] Rash   I reviewed her past medical history, social history, family history, and environmental history and no significant changes have been reported from her previous visit.  Review of Systems  Constitutional:  Negative for appetite change, chills, fever and unexpected weight change.  HENT:  Positive for rhinorrhea. Negative for congestion.   Eyes:  Negative for itching.  Respiratory:  Negative for cough, chest tightness, shortness of breath and wheezing.   Gastrointestinal:  Negative for abdominal pain.  Skin:  Negative for rash.  Allergic/Immunologic: Positive for environmental allergies. Negative for food allergies.  Neurological:  Negative for headaches.   Objective: BP 110/80   Pulse 73   Temp 97.6 F (36.4 C) (Temporal)   Resp 16   SpO2 100%  There is no height or weight on file to calculate BMI. Physical Exam Vitals and nursing note reviewed.  Constitutional:      Appearance: She is well-developed.  HENT:     Head: Normocephalic and atraumatic.     Right Ear:  External ear normal.     Left Ear: External ear normal.     Nose: Nose normal.  Eyes:     Conjunctiva/sclera: Conjunctivae normal.  Cardiovascular:  Rate and Rhythm: Normal rate and regular rhythm.     Heart sounds: Normal heart sounds. No murmur heard.    No friction rub. No gallop.  Pulmonary:     Effort: Pulmonary effort is normal.     Breath sounds: Normal breath sounds. No wheezing or rales.  Musculoskeletal:     Cervical back: Neck supple.  Skin:    General: Skin is warm.     Findings: No rash.  Neurological:     Mental Status: She is alert and oriented to person, place, and time.  Psychiatric:        Behavior: Behavior normal.   Previous notes and tests were reviewed. The plan was reviewed with the patient/family, and all questions/concerned were addressed.  It was my pleasure to see [name] [name] today and participate in her care. Please feel free to contact me with any questions or concerns.  Sincerely,  Rexene Alberts, DO Allergy & Immunology  [facility] office: [place] office: [phone]

## 2022-06-07 ENCOUNTER — Encounter: Payer: Self-pay | Admitting: Allergy

## 2022-06-07 ENCOUNTER — Ambulatory Visit (INDEPENDENT_AMBULATORY_CARE_PROVIDER_SITE_OTHER): Payer: Medicare Other | Admitting: Allergy

## 2022-06-07 VITALS — BP 110/80 | HR 73 | Temp 97.6°F | Resp 16

## 2022-06-07 DIAGNOSIS — K219 Gastro-esophageal reflux disease without esophagitis: Secondary | ICD-10-CM

## 2022-06-07 DIAGNOSIS — J3089 Other allergic rhinitis: Secondary | ICD-10-CM

## 2022-06-07 DIAGNOSIS — J302 Other seasonal allergic rhinitis: Secondary | ICD-10-CM

## 2022-06-07 DIAGNOSIS — J4540 Moderate persistent asthma, uncomplicated: Secondary | ICD-10-CM | POA: Diagnosis not present

## 2022-06-07 DIAGNOSIS — Z91030 Bee allergy status: Secondary | ICD-10-CM

## 2022-06-07 MED ORDER — ESOMEPRAZOLE MAGNESIUM 40 MG PO CPDR: 40.0000 mg | DELAYED_RELEASE_CAPSULE | Freq: Every day | ORAL | 3 refills | Status: DC

## 2022-06-07 MED ORDER — FAMOTIDINE 40 MG PO TABS: 40.0000 mg | ORAL_TABLET | Freq: Every day | ORAL | 3 refills | Status: DC

## 2022-06-07 MED ORDER — IPRATROPIUM BROMIDE 0.03 % NA SOLN
1.0000 | Freq: Two times a day (BID) | NASAL | 3 refills | Status: DC | PRN
Start: 2022-06-07 — End: 2022-12-25

## 2022-06-07 NOTE — Patient Instructions (Addendum)
Moderate persistent asthma  Daily controller medication(s): continue Symbicort 127mg 2 puffs twice a day with spacer and rinse mouth afterwards. Continue Singulair '10mg'$  daily  May use albuterol rescue inhaler 2 puffs every 4 to 6 hours as needed for shortness of breath, chest tightness, coughing, and wheezing. May use albuterol rescue inhaler 2 puffs 5 to 15 minutes prior to strenuous physical activities. Monitor frequency of use.  Asthma control goals:  Full participation in all desired activities (may need albuterol before activity) Albuterol use two times or less a week on average (not counting use with activity) Cough interfering with sleep two times or less a month Oral steroids no more than once a year No hospitalizations   Seasonal and perennial allergic rhinitis 2018 skin testing was positive to dust mites and tree pollen. Continue environmental control measures. Use Atrovent (ipratropium) 0.03% 1-2 sprays per nostril twice a day as needed for runny nose/drainage.  Use Flonase (fluticasone) nasal spray 1 spray per nostril twice a day as needed for nasal congestion.  Nasal saline spray (i.e., Simply Saline) or nasal saline lavage (i.e., NeilMed) is recommended as needed and prior to medicated nasal sprays. May use over the counter antihistamines such as Zyrtec (cetirizine), Claritin (loratadine), Allegra (fexofenadine), or Xyzal (levocetirizine) daily as needed.   LPRD (laryngopharyngeal reflux disease) Continue Nexium '40mg'$  daily. Continue pepcid '40mg'$  daily.     Bee sting allergy For mild symptoms you can take over the counter antihistamines such as Benadryl and monitor symptoms closely. If symptoms worsen or if you have severe symptoms including breathing issues, throat closure, significant swelling, whole body hives, severe diarrhea and vomiting, lightheadedness then inject epinephrine and seek immediate medical care afterwards. Get bloodwork We are ordering labs, so please allow  1-2 weeks for the results to come back. With the newly implemented Cures Act, the labs might be visible to you at the same time that they become visible to me. However, I will not address the results until all of the results are back, so please be patient.  In the meantime, continue recommendations in your patient instructions, including avoidance measures (if applicable), until you hear from me.   Follow up in 6 months or sooner if needed.

## 2022-06-08 NOTE — Assessment & Plan Note (Signed)
No stings since last visit.   For mild symptoms you can take over the counter antihistamines such as Benadryl and monitor symptoms closely. If symptoms worsen or if you have severe symptoms including breathing issues, throat closure, significant swelling, whole body hives, severe diarrhea and vomiting, lightheadedness then inject epinephrine and seek immediate medical care afterwards.  Get bloodwork.

## 2022-06-08 NOTE — Assessment & Plan Note (Signed)
Stable. Unable to wean off meds.   Continue Nexium '40mg'$  daily.  Continue pepcid '40mg'$  daily.

## 2022-06-08 NOTE — Assessment & Plan Note (Signed)
Past history - 2018 skin testing was positive to dust mites and tree pollen. Interim history - rhinorrhea.  Continue environmental control measures.  Use Atrovent (ipratropium) 0.03% 1-2 sprays per nostril twice a day as needed for runny nose/drainage.  Use Flonase (fluticasone) nasal spray 1 spray per nostril twice a day as needed for nasal congestion.   Nasal saline spray (i.e., Simply Saline) or nasal saline lavage (i.e., NeilMed) is recommended as needed and prior to medicated nasal sprays.  May use over the counter antihistamines such as Zyrtec (cetirizine), Claritin (loratadine), Allegra (fexofenadine), or Xyzal (levocetirizine) daily as needed.

## 2022-06-08 NOTE — Assessment & Plan Note (Signed)
Some DOE. No prednisone use.   Today's spirometry was unremarkable. . Daily controller medication(s): continue Symbicort 149mg 2 puffs twice a day with spacer and rinse mouth afterwards. . Continue Singulair '10mg'$  daily. . May use albuterol rescue inhaler 2 puffs every 4 to 6 hours as needed for shortness of breath, chest tightness, coughing, and wheezing. May use albuterol rescue inhaler 2 puffs 5 to 15 minutes prior to strenuous physical activities. Monitor frequency of use.   Get spirometry at next visit.

## 2022-06-10 LAB — ALLERGEN HYMENOPTERA PANEL
Bumblebee: <0.1 kU/L
Honeybee IgE: <0.1 kU/L
Hornet, White Face, IgE: <0.1 kU/L
Hornet, Yellow, IgE: <0.1 kU/L
Paper Wasp IgE: <0.1 kU/L
Yellow Jacket, IgE: <0.1 kU/L

## 2022-06-10 LAB — TRYPTASE: Tryptase: 8.4 ug/L (ref 2.2–13.2)

## 2022-06-10 NOTE — Progress Notes (Signed)
Please call patient.  Stinging insect panel was all negative.

## 2022-06-13 ENCOUNTER — Ambulatory Visit: Payer: Medicare Other | Attending: Cardiology | Admitting: Pharmacist

## 2022-06-13 DIAGNOSIS — I10 Essential (primary) hypertension: Secondary | ICD-10-CM

## 2022-06-13 DIAGNOSIS — E785 Hyperlipidemia, unspecified: Secondary | ICD-10-CM | POA: Diagnosis not present

## 2022-06-13 NOTE — Patient Instructions (Addendum)
Continue taking: Metoprolol succinate '50mg'$  daily Spironolactone '25mg'$  daily Furosemide '40mg'$  daily Irbesartan 300 mg daily  Call me at [phone] with any questions

## 2022-06-13 NOTE — Progress Notes (Signed)
Patient ID: [name] [name]                 DOB: 10/08/1950                    MRN: [id number]     HPI: [name] [name] is a 71 y.o. female patient referred to lipid clinic by Dr. Harrington Challenger. PMH is significant for HTN, HLD, T2DM (A1c 6.3%), GERD, gout, and OSA on CPAP. Patient followed by Dr. Harrington Challenger for HTN and HLD since 2013. Has been taking atorvastatin 40 mg daily and ezetimibe 10 mg daily since Jan 2018.  At 12/20/21 visit with PharmD both patients lipids and HTN were addressed. Home cuff was found to be accurate. Pt was started on irbesartan 175m daily. She was also started on Repatha 14110mq14 days. At follow up on 01/16/22 pt shared her BP machine kept giving her an error message but planned to get new cuff if could not get it to work. BP was 141/73 in clinic, BMET was stable so irbesartan increased to 300 mg daily on 01/17/22. Pt missed planned follow up 01/31/22. Pt seen by PeNicholas LosePharmD on 02/07/22 for medication management and BP was 114/56. Pt called with lipid results 03/28/22, informed LDL at goal and to continue atorvastatin 40 mg daily and Repatha 140 mg q14days.  Patient presents today to clinic for follow up. She brings in her home BP cuff. Equate cuff. Cuff found to run a little high   132/78 home cuff (left arm) 128/74 clinic automatic cuff (left arm)  Current Lipid Medications:  Atorvastatin 40 mg daily Repatha 14039m 14 days  Current HTN Medications: Metoprolol succinate 56m33mily Spironolactone 25mg18mly Furosemide 40mg 48my Irbesartan 300 mg daily  Intolerances: valsartan (sleepy) Risk Factors: HTN, DM, OSA LDL goal: <70 mg/dL  Diet: lost weight by eating less/one meal a day Coffee with a lot of cream and sugar Lunch (1-2p): chicken, green beans/cabbage, collard greens & rice or mac n cheese Doesn't eat dinner unless she's out and gets a hot dog and french fries ~once a week Doesn't eat anything after 6pm  Exercise: ambulates with a walker when  traveling. Has trouble getting up the stairs. Used to go on walks but has history of falls   Family History: Cancer in her sister; Congestive Heart Failure in her brother; Diabetes in her father; Heart disease in her mother; Hyperlipidemia in her brother and sister; Hypertension in her brother, sister, and sister.   Social History: Prior tobacco use (0.25 pack-year history, quit 1977). Denies alcohol or illicit drug use.  Labs: 03/27/2022: TC 71, TG 100, HDL 34, LDL 18, apoB 31 (atorvastatin 40 mg, Repatha 140 mg [place] q2weeks)  11/13/2021: TC 135, TG 97, HDL 39, LDL 78 (atorvastatin 40 mg, ezetimibe 10 mg) 11/12/2019: TC 118, TG 103, HDL 36, LDL 63 (atorvastatin 40 mg, ezetimibe 10 mg) 11/27/2016: TC 121, TG 99, HDL 28, LDL 73 (atorvastatin 40 mg, ezetimibe 10 mg)   Past Medical History:  Diagnosis Date   Allergy    Angina    Anxiety    Arthritis    Asthma    Blood transfusion    Blood transfusion without reported diagnosis    Cataract    Chronic cough    Now followed by pulmonary   Chronic lower back pain    Complication of anesthesia    "just wake up coughing; that's all"   Concussion 11/18/11   "fell at dr's office;  hit head"   COPD (chronic obstructive pulmonary disease) ([place])    Delayed gastric emptying    Depression    Diabetes mellitus type II    "take insulin & pills"   Dyslipidemia    Exertional dyspnea    GERD (gastroesophageal reflux disease)    Glaucoma    Gout    Headache(784.0) 11/18/11   "I've had mild headaches the last couple days"   Headache(784.0) 01/08/12   "pretty constant since 11/18/11's concussion"   Heart murmur    History of colonoscopy    HTN (hypertension)    Myocardial infarction ([place])    Osteoporosis    Peripheral neuropathy    Pneumonia    "i've had it once" (11/18/11)   Sleep apnea    on CPAP 12   Syncope 06/16/2012   Vocal cord paralysis, unilateral partial    "right"    Current Outpatient Medications on File Prior to Visit   Medication Sig Dispense Refill   Accu-Chek FastClix Lancets MISC Use to check blood sugars 3x per day. E11.9 306 each 12   albuterol (PROVENTIL HFA) 108 (90 Base) MCG/ACT inhaler Inhale 2 puffs into the lungs every 4 (four) hours as needed for wheezing or shortness of breath. 1 each 5   Alcohol Swabs (B-D SINGLE USE SWABS REGULAR) PADS USE THREE TIMES DAILY 300 each 3   allopurinol (ZYLOPRIM) 300 MG tablet TAKE 1 TABLET BY MOUTH DAILY 100 tablet 2   ammonium lactate (LAC-HYDRIN) 12 % lotion APPLY 1 APPLICATION TOPICALLY TWICE DAILY AS NEEDED FOR DRY SKIN (SUBSTITUTED FOR LAC HYDRIN) 400 g 12   aspirin EC 81 MG tablet Take 81 mg by mouth daily.     atorvastatin (LIPITOR) 40 MG tablet TAKE 1 TABLET BY MOUTH DAILY 100 tablet 2   B-D ULTRAFINE III SHORT PEN 31G X 8 MM MISC USE TO INJECT THREE TIMES A DAY 90 each 3   Blood Glucose Monitoring Suppl (ACCU-CHEK GUIDE) w/Device KIT USE TO CHECK BLOOD SUGAR 3 TIMES DAILY 1 kit 0   Brimonidine Tartrate-Timolol (COMBIGAN OP) Combigan     budesonide-formoterol (SYMBICORT) 160-4.5 MCG/ACT inhaler Inhale 2 puffs into the lungs in the morning and at bedtime. with spacer and rinse mouth afterwards. 30.6 g 2   buPROPion (WELLBUTRIN) 100 MG tablet Take 1 tablet (100 mg total) by mouth 2 (two) times daily. 180 tablet 1   COMBIGAN 0.2-0.5 % ophthalmic solution INSTILL 1 DROP INTO THE  LEFT EYE EVERY 12 HOURS 5 mL 12   Dulaglutide (TRULICITY) 1.5 TF/5.7DU SOPN INJECT THE CONTENTS OF ONE  PEN SUBCUTANEOUSLY WEEKLY  AS DIRECTED 6 mL 3   DULoxetine (CYMBALTA) 60 MG capsule Take 1 capsule (60 mg total) by mouth daily. 90 capsule 1   esomeprazole (NEXIUM) 40 MG capsule Take 1 capsule (40 mg total) by mouth daily. 90 capsule 3   Evolocumab (REPATHA SURECLICK) 202 MG/ML SOAJ Inject 140 mg into the skin every 14 (fourteen) days. 6 mL 3   famciclovir (FAMVIR) 500 MG tablet Take 1 tablet (500 mg total) by mouth 3 (three) times daily. 21 tablet 0   famotidine (PEPCID) 40 MG  tablet Take 1 tablet (40 mg total) by mouth daily. 90 tablet 3   fluticasone (FLONASE) 50 MCG/ACT nasal spray Place 1 spray into both nostrils in the morning and at bedtime. 48 g 2   furosemide (LASIX) 40 MG tablet Take one tablet by mouth daily 90 tablet 3   glucose blood (ACCU-CHEK GUIDE) test strip Use  to check blood sugars 3x per day. E11.9 100 each 12   insulin glargine (LANTUS SOLOSTAR) 100 UNIT/ML Solostar Pen INJECT SUBCUTANEOUSLY 35  UNITS TWICE DAILY 75 mL 2   insulin lispro (HUMALOG KWIKPEN) 100 UNIT/ML KwikPen Inject 20 Units into the skin daily after supper. 225 mL 3   Insulin Syringe-Needle U-100 (BD INSULIN SYRINGE ULTRAFINE) 31G X 15/64" 0.5 ML MISC Use to inject three times a day 300 each 2   ipratropium (ATROVENT) 0.03 % nasal spray Place 1-2 sprays into both nostrils 2 (two) times daily as needed (nasal drainage). 30 mL 3   irbesartan (AVAPRO) 300 MG tablet Take 1 tablet (300 mg total) by mouth daily. 90 tablet 3   latanoprost (XALATAN) 0.005 % ophthalmic solution Place 1 drop into both eyes at bedtime. 2.5 mL 3   levocetirizine (XYZAL) 5 MG tablet Take 1 tablet (5 mg total) by mouth every evening. 30 tablet 0   LINZESS 290 MCG CAPS capsule Take 1 capsule (290 mcg total) by mouth daily as needed. 30 capsule    metoCLOPramide (REGLAN) 10 MG tablet Take 20 mg by mouth at bedtime.     metoprolol succinate (TOPROL-XL) 50 MG 24 hr tablet TAKE 3 TABLETS BY MOUTH DAILY  WITH OR IMMEDIATELY FOLLOWING A  MEAL 300 tablet 3   mometasone (ELOCON) 0.1 % cream Apply to affected area daily as needed do not use on face 45 g 5   montelukast (SINGULAIR) 10 MG tablet TAKE 1 TABLET BY MOUTH DAILY 90 tablet 1   Multiple Vitamins-Minerals (MULTIVITAMIN PO) Take 1 tablet by mouth daily.     RESTASIS MULTIDOSE 0.05 % ophthalmic emulsion Place 1 drop into both eyes daily as needed.  3   spironolactone (ALDACTONE) 25 MG tablet TAKE 1 TABLET BY MOUTH DAILY  BEFORE SUPPER 100 tablet 3   TYRVAYA 0.03 MG/ACT  SOLN Place into both nostrils.     No current facility-administered medications on file prior to visit.    Allergies  Allergen Reactions   Bee Venom Hives and Swelling   Propoxyphene Hcl Itching   Amlodipine Besylate Swelling   Hydrocodone Other (See Comments)    With Vicodin - makes patient "jittery", and "hangover effect - sleepy next day"   Lisinopril Cough    Changed to ARB   Metformin And Related Diarrhea    GI distress   Metronidazole Other (See Comments)     "just didn't work; my body never did heal from it"   Valsartan Other (See Comments)      "sleep more the next day after I took it; it made me real tired"   Sulfasalazine Other (See Comments)    Other reaction(s): Unknown   Xyzal [Levocetirizine Dihydrochloride] Rash    Assessment/Plan:  1. Hypertension - Blood pressure is at goal of <130/80 in clinic. Continue metoprolol succinate 3m daily spironolactone 229mdaily furosemide 4038maily and irbesartan 300 mg daily. Patient taught how to use her cuff. Reminded that it runs abut 5 points high. Checking BMP today since it hasnt been checked since increase irbesartan. Follow up as needed.  Thank you,   MelRamond Dialharm.D, BCPS, CPP [facility]2[place] StreetreStar JunctionC [id number]: [phone]: [phone]

## 2022-06-14 ENCOUNTER — Telehealth: Payer: Self-pay | Admitting: Pharmacist

## 2022-06-14 LAB — BASIC METABOLIC PANEL
BUN/Creatinine Ratio: 15 (ref 12–28)
BUN: 14 mg/dL (ref 8–27)
CO2: 26 mmol/L (ref 20–29)
Calcium: 10 mg/dL (ref 8.7–10.3)
Chloride: 101 mmol/L (ref 96–106)
Creatinine, Ser: 0.92 mg/dL (ref 0.57–1.00)
Glucose: 85 mg/dL (ref 70–99)
Potassium: 4.3 mmol/L (ref 3.5–5.2)
Sodium: 140 mmol/L (ref 134–144)
eGFR: 67 mL/min/{1.73_m2} (ref >59)

## 2022-06-14 NOTE — Telephone Encounter (Signed)
Called patient to let her know that her labs are stable. Continue current medications. Patient appreciative of the call.

## 2022-07-09 ENCOUNTER — Other Ambulatory Visit: Payer: Self-pay | Admitting: Allergy

## 2022-07-09 ENCOUNTER — Encounter: Payer: Self-pay | Admitting: Podiatry

## 2022-07-09 ENCOUNTER — Ambulatory Visit (INDEPENDENT_AMBULATORY_CARE_PROVIDER_SITE_OTHER): Payer: Medicare Other | Admitting: Podiatry

## 2022-07-09 DIAGNOSIS — M79675 Pain in left toe(s): Secondary | ICD-10-CM | POA: Diagnosis not present

## 2022-07-09 DIAGNOSIS — M79674 Pain in right toe(s): Secondary | ICD-10-CM

## 2022-07-09 DIAGNOSIS — E1142 Type 2 diabetes mellitus with diabetic polyneuropathy: Secondary | ICD-10-CM | POA: Diagnosis not present

## 2022-07-09 DIAGNOSIS — B351 Tinea unguium: Secondary | ICD-10-CM | POA: Diagnosis not present

## 2022-07-09 DIAGNOSIS — L84 Corns and callosities: Secondary | ICD-10-CM

## 2022-07-10 ENCOUNTER — Telehealth: Payer: Self-pay

## 2022-07-10 NOTE — Patient Outreach (Signed)
  Care Coordination   07/10/2022 Name: [name] [name] MRN: [id number] DOB: 1950-10-11   Care Coordination Outreach Attempts:  An unsuccessful telephone outreach was attempted today to offer the patient information about available care coordination services as a benefit of their health plan.   Follow Up Plan:  Additional outreach attempts will be made to offer the patient care coordination information and services.   Encounter Outcome:  No Answer  Care Coordination Interventions Activated:  No   Care Coordination Interventions:  No, not indicated    Johnney Killian, RN, BSN, CCM Care Management Coordinator [facility] Phone: [phone]: [phone]

## 2022-07-12 ENCOUNTER — Other Ambulatory Visit: Payer: Self-pay | Admitting: Family Medicine

## 2022-07-12 DIAGNOSIS — E1149 Type 2 diabetes mellitus with other diabetic neurological complication: Secondary | ICD-10-CM

## 2022-07-14 NOTE — Progress Notes (Signed)
  Subjective:  Patient ID: [name] [name], female    DOB: August 13, 1951,  MRN: [id number]  [name] [name] presents to clinic today for at risk foot care with history of diabetic neuropathy and callus(es) b/l lower extremities and painful thick toenails that are difficult to trim. Painful toenails interfere with ambulation. Aggravating factors include wearing enclosed shoe gear. Pain is relieved with periodic professional debridement. Painful calluses are aggravated when weightbearing with and without shoegear. Pain is relieved with periodic professional debridement.  Chief Complaint  Patient presents with   Nail Problem    RFC Bilateral nail trim 1-5 Pt is diabetic ans states she does not check glucose daily. \ A1c 6/4   New problem(s): None.   PCP is Dickie La, MD , and last visit was June 05, 2022.  Allergies  Allergen Reactions   Bee Venom Hives and Swelling   Propoxyphene Hcl Itching   Amlodipine Besylate Swelling   Hydrocodone Other (See Comments)    With Vicodin - makes patient "jittery", and "hangover effect - sleepy next day"   Lisinopril Cough    Changed to ARB   Metformin And Related Diarrhea    GI distress   Metronidazole Other (See Comments)     "just didn't work; my body never did heal from it"   Valsartan Other (See Comments)      "sleep more the next day after I took it; it made me real tired"   Sulfasalazine Other (See Comments)    Other reaction(s): Unknown   Xyzal [Levocetirizine Dihydrochloride] Rash    Review of Systems: Negative except as noted in the HPI.  Objective: No changes noted in today's physical examination.  [name] [name] is a pleasant 71 y.o. female in NAD. AAO x 3. Constitutional [name] [name] is a pleasant 71 y.o. African American female, obese in NAD. AAO x 3.   Vascular CFT immediate b/l LE. Palpable DP/PT pulses b/l LE. Digital hair absent b/l. Skin temperature gradient WNL b/l. No pain with calf  compression b/l. Trace edema noted b/l. No cyanosis or clubbing noted b/l LE.  Neurologic Normal speech. Oriented to person, place, and time. Pt has subjective symptoms of neuropathy. Protective sensation intact 5/5 intact bilaterally with 10g monofilament b/l. Vibratory sensation intact b/l.  Dermatologic Pedal integument with normal turgor, texture and tone b/l LE. No open wounds b/l. No interdigital macerations b/l. Toenails 1-5 b/l elongated, thickened, discolored with subungual debris. +Tenderness with dorsal palpation of nailplates.  Orthopedic: Muscle strength 5/5 to all lower extremity muscle groups bilaterally. Hammertoe deformity noted 2-5 b/l. Pes planus deformity noted bilateral LE. Utilizes rollator for ambulation assistance. Wearing diabetic shoes on today's visit.   Radiographs: None Assessment/Plan: 1. Pain due to onychomycosis of toenails of both feet   2. Callus   3. Diabetic peripheral neuropathy associated with type 2 diabetes mellitus (HCC)     No orders of the defined types were placed in this encounter.   -Consent given for treatment as described below: -Examined patient. -Toenails 1-5 b/l were debrided in length and girth with sterile nail nippers and dremel without iatrogenic bleeding.  -Callus(es) bilateral heels and submet head 3 left foot pared utilizing sterile scalpel blade without complication or incident. Total number debrided =3. -Patient/POA to call should there be question/concern in the interim.   Return in about 3 months (around 10/09/2022).  Marzetta Board, DPM

## 2022-07-23 ENCOUNTER — Other Ambulatory Visit: Payer: Self-pay | Admitting: Family Medicine

## 2022-08-15 DIAGNOSIS — K5730 Diverticulosis of large intestine without perforation or abscess without bleeding: Secondary | ICD-10-CM | POA: Diagnosis not present

## 2022-08-15 DIAGNOSIS — K219 Gastro-esophageal reflux disease without esophagitis: Secondary | ICD-10-CM | POA: Diagnosis not present

## 2022-08-15 DIAGNOSIS — K449 Diaphragmatic hernia without obstruction or gangrene: Secondary | ICD-10-CM | POA: Diagnosis not present

## 2022-08-15 DIAGNOSIS — K5900 Constipation, unspecified: Secondary | ICD-10-CM | POA: Diagnosis not present

## 2022-08-15 DIAGNOSIS — Z1211 Encounter for screening for malignant neoplasm of colon: Secondary | ICD-10-CM | POA: Diagnosis not present

## 2022-08-15 DIAGNOSIS — K760 Fatty (change of) liver, not elsewhere classified: Secondary | ICD-10-CM | POA: Diagnosis not present

## 2022-08-15 DIAGNOSIS — K3184 Gastroparesis: Secondary | ICD-10-CM | POA: Diagnosis not present

## 2022-08-16 DIAGNOSIS — H401133 Primary open-angle glaucoma, bilateral, severe stage: Secondary | ICD-10-CM | POA: Diagnosis not present

## 2022-09-05 ENCOUNTER — Ambulatory Visit (INDEPENDENT_AMBULATORY_CARE_PROVIDER_SITE_OTHER): Payer: Medicare Other | Admitting: Family Medicine

## 2022-09-05 ENCOUNTER — Encounter: Payer: Self-pay | Admitting: Family Medicine

## 2022-09-05 VITALS — BP 128/80 | HR 54 | Ht 61.0 in | Wt 172.4 lb

## 2022-09-05 DIAGNOSIS — Z23 Encounter for immunization: Secondary | ICD-10-CM

## 2022-09-05 DIAGNOSIS — E785 Hyperlipidemia, unspecified: Secondary | ICD-10-CM | POA: Diagnosis not present

## 2022-09-05 DIAGNOSIS — I10 Essential (primary) hypertension: Secondary | ICD-10-CM

## 2022-09-05 DIAGNOSIS — G629 Polyneuropathy, unspecified: Secondary | ICD-10-CM

## 2022-09-05 DIAGNOSIS — Z86010 Personal history of colonic polyps: Secondary | ICD-10-CM

## 2022-09-05 DIAGNOSIS — K219 Gastro-esophageal reflux disease without esophagitis: Secondary | ICD-10-CM | POA: Diagnosis not present

## 2022-09-05 DIAGNOSIS — E1149 Type 2 diabetes mellitus with other diabetic neurological complication: Secondary | ICD-10-CM | POA: Diagnosis not present

## 2022-09-05 DIAGNOSIS — F339 Major depressive disorder, recurrent, unspecified: Secondary | ICD-10-CM

## 2022-09-05 LAB — POCT GLYCOSYLATED HEMOGLOBIN (HGB A1C): HbA1c, POC (controlled diabetic range): 6.4 % (ref 0.0–7.0)

## 2022-09-05 MED ORDER — ESOMEPRAZOLE MAGNESIUM 40 MG PO CPDR: 40.0000 mg | DELAYED_RELEASE_CAPSULE | Freq: Every day | ORAL | 3 refills | Status: DC

## 2022-09-05 MED ORDER — FAMOTIDINE 40 MG PO TABS: 40.0000 mg | ORAL_TABLET | Freq: Every day | ORAL | 3 refills | Status: DC

## 2022-09-06 ENCOUNTER — Encounter: Payer: Self-pay | Admitting: Family Medicine

## 2022-09-06 NOTE — Assessment & Plan Note (Signed)
Patient only saw her GI doctor who recommended she have no further colonoscopies.

## 2022-09-06 NOTE — Assessment & Plan Note (Signed)
Refilled her medications.  She continues to do well on proton pump inhibitor plus previously which was started by her gastroenterologist.

## 2022-09-06 NOTE — Progress Notes (Signed)
    CHIEF COMPLAINT / HPI:  Last 1 #1.  Follow-up diabetes mellitus: Taking her medicines on her own schedule as she has been doing for the last 2 years.  Seems to be doing well.  No episodes of low blood sugar.  No unusual frequency of urination, no unusual thirst. 2.  Follow-up hypertension: Taking medicines regularly no problems.  Also sees cardiologist.  Recently had some blood work done and her lipids had improved. 3. Needs refill son her GI meds. Combo she is on seems to be working for reflux   PERTINENT  PMH / PSH: I have reviewed the patient's medications, allergies, past medical and surgical history, smoking status and updated in the EMR as appropriate.   OBJECTIVE:  BP 128/82   Pulse (!) 54   Ht '5\' 1"'$  (1.549 m)   Wt 172 lb 6.4 oz (78.2 kg)   SpO2 97%   BMI 32.57 kg/m  Vital signs reviewed. GENERAL: Well-developed, well-nourished, no acute distress. CARDIOVASCULAR: Regular rate and rhythm no murmur gallop or rub LUNGS: Clear to auscultation bilaterally, no rales or wheeze. ABDOMEN: Soft positive bowel sounds NEURO:  Blind MSK: Movement of extremity x 4. Rises from chair with some assistance from her walker. Gait is mildly stooped, appears stable.   ASSESSMENT / PLAN:   Neuropathy Currently stable.  History of colonic polyps Patient only saw her GI doctor who recommended she have no further colonoscopies.  Hyperlipidemia Improved significantly on Repatha.  She is being followed by cardiology as well.  Reviewed her lipid panel with her.  No medication changes.  HYPERTENSION, BENIGN SYSTEMIC Blood pressure with good control.  Will check some labs today.  Continue current medicines.  Major depressive disorder, recurrent episode ([place]) She continues to follow with mental health system and is doing well.  They are managing her medicines as well.  LPRD (laryngopharyngeal reflux disease) Refilled her medications.  She continues to do well on proton pump inhibitor plus  previously which was started by her gastroenterologist.  Diabetes mellitus type 2 with neurological manifestations [facility]) She continues to follow her own method for taking her medicines but her A1c looks great today so will not change.   [name] MD

## 2022-09-06 NOTE — Assessment & Plan Note (Signed)
She continues to follow her own method for taking her medicines but her A1c looks great today so will not change.

## 2022-09-06 NOTE — Assessment & Plan Note (Signed)
Blood pressure with good control.  Will check some labs today.  Continue current medicines.

## 2022-09-06 NOTE — Assessment & Plan Note (Signed)
Currently stable.

## 2022-09-06 NOTE — Assessment & Plan Note (Signed)
Improved significantly on Repatha.  She is being followed by cardiology as well.  Reviewed her lipid panel with her.  No medication changes.

## 2022-09-06 NOTE — Assessment & Plan Note (Signed)
She continues to follow with mental health system and is doing well.  They are managing her medicines as well.

## 2022-09-16 ENCOUNTER — Telehealth: Payer: Self-pay

## 2022-09-16 MED ORDER — ZOSTER VAC RECOMB ADJUVANTED 50 MCG/0.5ML IM SUSR: 0.5000 mL | Freq: Once | INTRAMUSCULAR | 0 refills | Status: AC

## 2022-09-16 NOTE — Telephone Encounter (Signed)
Patient calls nurse line regarding Shingles vaccine. She reports that she needs prescription to be sent to pharmacy.   When patient was seen in the office last, the order was placed as "other orders/future."  I have pended this to be sent to her pharmacy.   Forwarding to PCP.   Talbot Grumbling, RN

## 2022-10-02 ENCOUNTER — Other Ambulatory Visit: Payer: Self-pay | Admitting: Internal Medicine

## 2022-10-02 ENCOUNTER — Other Ambulatory Visit: Payer: Self-pay | Admitting: Allergy

## 2022-10-03 ENCOUNTER — Ambulatory Visit (HOSPITAL_COMMUNITY): Payer: 59 | Admitting: Psychiatry

## 2022-10-03 ENCOUNTER — Telehealth (HOSPITAL_COMMUNITY): Payer: Self-pay | Admitting: Psychiatry

## 2022-10-03 NOTE — Telephone Encounter (Signed)
Patient did not arrive for in person office visit as scheduled. Patient was not present on video platform used through [employer].  I was not able to speak with the patient today, as they were a no show for their scheduled appointment.

## 2022-10-04 ENCOUNTER — Other Ambulatory Visit: Payer: Self-pay | Admitting: Internal Medicine

## 2022-10-08 ENCOUNTER — Other Ambulatory Visit (HOSPITAL_COMMUNITY): Payer: Self-pay | Admitting: Psychiatry

## 2022-10-08 DIAGNOSIS — F330 Major depressive disorder, recurrent, mild: Secondary | ICD-10-CM

## 2022-10-08 DIAGNOSIS — F411 Generalized anxiety disorder: Secondary | ICD-10-CM

## 2022-10-10 ENCOUNTER — Telehealth (HOSPITAL_COMMUNITY): Payer: 59 | Admitting: Psychiatry

## 2022-10-15 ENCOUNTER — Other Ambulatory Visit: Payer: Self-pay | Admitting: Family Medicine

## 2022-10-18 ENCOUNTER — Other Ambulatory Visit: Payer: Self-pay | Admitting: Family Medicine

## 2022-10-25 ENCOUNTER — Other Ambulatory Visit (HOSPITAL_COMMUNITY): Payer: Self-pay

## 2022-10-25 DIAGNOSIS — G4733 Obstructive sleep apnea (adult) (pediatric): Secondary | ICD-10-CM | POA: Diagnosis not present

## 2022-10-30 ENCOUNTER — Encounter: Payer: Self-pay | Admitting: Podiatry

## 2022-10-30 ENCOUNTER — Ambulatory Visit (INDEPENDENT_AMBULATORY_CARE_PROVIDER_SITE_OTHER): Payer: 59 | Admitting: Podiatry

## 2022-10-30 VITALS — BP 166/73

## 2022-10-30 DIAGNOSIS — M79675 Pain in left toe(s): Secondary | ICD-10-CM | POA: Diagnosis not present

## 2022-10-30 DIAGNOSIS — E1142 Type 2 diabetes mellitus with diabetic polyneuropathy: Secondary | ICD-10-CM | POA: Diagnosis not present

## 2022-10-30 DIAGNOSIS — B351 Tinea unguium: Secondary | ICD-10-CM | POA: Diagnosis not present

## 2022-10-30 DIAGNOSIS — M79674 Pain in right toe(s): Secondary | ICD-10-CM | POA: Diagnosis not present

## 2022-10-30 DIAGNOSIS — L84 Corns and callosities: Secondary | ICD-10-CM

## 2022-10-30 NOTE — Progress Notes (Unsigned)
  Subjective:  Patient ID: [name] [name], female    DOB: Aug 31, 1951,  MRN: [id number]  [name] [name] presents to clinic today for {jgcomplaint:23593}  Chief Complaint  Patient presents with   Nail Problem    [facility] BS-did not check today A1C-6.4 Dorita Fray PCP VST-09/05/2022   New problem(s): None. {jgcomplaint:23593}  PCP is Dickie La, MD.  Allergies  Allergen Reactions   Bee Venom Hives and Swelling   Propoxyphene Hcl Itching   Amlodipine Besylate Swelling   Hydrocodone Other (See Comments)    With Vicodin - makes patient "jittery", and "hangover effect - sleepy next day"   Lisinopril Cough    Changed to ARB   Metformin And Related Diarrhea    GI distress   Metronidazole Other (See Comments)     "just didn't work; my body never did heal from it"   Valsartan Other (See Comments)      "sleep more the next day after I took it; it made me real tired"   Sulfasalazine Other (See Comments)    Other reaction(s): Unknown   Xyzal [Levocetirizine Dihydrochloride] Rash    Review of Systems: Negative except as noted in the HPI.  Objective: No changes noted in today's physical examination. Vitals:   10/30/22 1506  BP: (!) 166/73   [name] [name] is a pleasant 72 y.o. female {jgbodyhabitus:24098} AAO x 3.  Vascular CFT immediate b/l LE. Palpable DP/PT pulses b/l LE. Digital hair absent b/l. Skin temperature gradient WNL b/l. No pain with calf compression b/l. Trace edema noted b/l. No cyanosis or clubbing noted b/l LE.  Neurologic Normal speech. Oriented to person, place, and time. Pt has subjective symptoms of neuropathy. Protective sensation intact 5/5 intact bilaterally with 10g monofilament b/l. Vibratory sensation intact b/l.  Dermatologic Pedal integument with normal turgor, texture and tone b/l LE. No open wounds b/l. No interdigital macerations b/l. Toenails 1-5 b/l elongated, thickened, discolored with subungual debris. +Tenderness with  dorsal palpation of nailplates.  Orthopedic: Muscle strength 5/5 to all lower extremity muscle groups bilaterally. Hammertoe deformity noted 2-5 b/l. Pes planus deformity noted bilateral LE. Utilizes rollator for ambulation assistance. Wearing diabetic shoes on today's visit.   Radiographs: None Assessment/Plan: 1. Pain due to onychomycosis of toenails of both feet   2. Callus   3. Diabetic peripheral neuropathy associated with type 2 diabetes mellitus ([place])     No orders of the defined types were placed in this encounter.   None {Jgplan:23602::"-Patient/POA to call should there be question/concern in the interim."}   Return in about 3 months (around 01/28/2023).  Marzetta Board, DPM

## 2022-10-31 ENCOUNTER — Telehealth: Payer: Self-pay

## 2022-10-31 NOTE — Telephone Encounter (Signed)
Received fax from [place] asking for a refill of the following medications not found on current med list.  Metoclopramide tab Acyclovir Cap  No directions or qty listed.   Ottis Stain, CMA

## 2022-11-01 ENCOUNTER — Other Ambulatory Visit: Payer: Self-pay | Admitting: Family Medicine

## 2022-11-01 NOTE — Progress Notes (Signed)
Has not had acyclovir or metoclopramide filled inl ast year so will ignore automated refill request for these from [place].

## 2022-11-02 ENCOUNTER — Other Ambulatory Visit: Payer: Self-pay | Admitting: Family Medicine

## 2022-11-05 ENCOUNTER — Telehealth: Payer: Self-pay

## 2022-11-05 NOTE — Telephone Encounter (Signed)
Received message from pharmacy requesting the following medications not on pt med list:  Metoclopramide Tab Acyclovir Cap  No other information given. Please send Rx if appropriate.  Ottis Stain, CMA

## 2022-11-15 ENCOUNTER — Other Ambulatory Visit (HOSPITAL_COMMUNITY): Payer: Self-pay | Admitting: Psychiatry

## 2022-11-15 DIAGNOSIS — F330 Major depressive disorder, recurrent, mild: Secondary | ICD-10-CM

## 2022-11-15 DIAGNOSIS — F411 Generalized anxiety disorder: Secondary | ICD-10-CM

## 2022-11-15 DIAGNOSIS — H401133 Primary open-angle glaucoma, bilateral, severe stage: Secondary | ICD-10-CM | POA: Diagnosis not present

## 2022-11-20 ENCOUNTER — Telehealth: Payer: Self-pay

## 2022-11-20 NOTE — Telephone Encounter (Signed)
Received fax from [place] asking for refills on the following meds not found on med list.  Metoclopramide tab  Acyclovir Cap  Please sen Rx if appropriate.  Ottis Stain, CMA

## 2022-11-20 NOTE — Telephone Encounter (Signed)
[name] [name] She is no longer on thise. If you sill have the fax send it back sayng CANCEL these meds. If you do not have it sill, no worries, I am sure it will come again, Has not had acyclovir or metoclopramide filled inl ast year so will ignore automated refill request for these from [place].

## 2022-11-21 ENCOUNTER — Telehealth (HOSPITAL_COMMUNITY): Payer: 59 | Admitting: Psychiatry

## 2022-11-21 ENCOUNTER — Telehealth (HOSPITAL_COMMUNITY): Payer: Self-pay | Admitting: Psychiatry

## 2022-11-21 NOTE — Telephone Encounter (Signed)
Patient was not present on video platform used through [employer]. I called the patient at our scheduled appointment time. There was no answer. I left a voice message for patient to call the clinic back at their convinence. There was no return phone call during out scheduled visit time. I was not able to speak with the patient today, as they were a no show for their scheduled appointment.   

## 2022-11-28 ENCOUNTER — Ambulatory Visit: Payer: Medicare Other | Admitting: Internal Medicine

## 2022-11-30 ENCOUNTER — Other Ambulatory Visit (HOSPITAL_COMMUNITY): Payer: Self-pay | Admitting: Psychiatry

## 2022-11-30 DIAGNOSIS — F411 Generalized anxiety disorder: Secondary | ICD-10-CM

## 2022-11-30 DIAGNOSIS — F330 Major depressive disorder, recurrent, mild: Secondary | ICD-10-CM

## 2022-12-10 NOTE — Progress Notes (Deleted)
Follow Up Note  RE: [name] [name] MRN: [id number] DOB: Oct 14, 1950 Date of Office Visit: 12/11/2022  Referring provider: Nestor Ramp, MD Primary care provider: Nestor Ramp, MD  Chief Complaint: No chief complaint on file.  History of Present Illness: I had the pleasure of seeing [name] [name] for a follow up visit at the [facility] on 12/10/2022. She is a 72 y.o. female, who is being follow for asthma, allergic rhinitis, LPRD and bee sting allergy. Her previous allergy office visit was on 06/07/2022 with Dr. Selena Batten. Today is a regular follow up visit.  Please call patient.  Stinging insect panel was all negative.   Moderate persistent asthma without complication Some DOE. No prednisone use.  Today's spirometry was unremarkable. Daily controller medication(s): continue Symbicort 2 puffs twice a day with spacer and rinse mouth afterwards. Continue Singulair 10mg  daily. May use albuterol rescue inhaler 2 puffs every 4 to 6 hours as needed for shortness of breath, chest tightness, coughing, and wheezing. May use albuterol rescue inhaler 2 puffs 5 to 15 minutes prior to strenuous physical activities. Monitor frequency of use.  Get spirometry at next visit.   Seasonal and perennial allergic rhinitis Past history - 2018 skin testing was positive to dust mites and tree pollen. Interim history - rhinorrhea. Continue environmental control measures. Use Atrovent (ipratropium) 0.03% 1-2 sprays per nostril twice a day as needed for runny nose/drainage. Use Flonase (fluticasone) nasal spray 1 spray per nostril twice a day as needed for nasal congestion.  Nasal saline spray (i.e., Simply Saline) or nasal saline lavage (i.e., NeilMed) is recommended as needed and prior to medicated nasal sprays. May use over the counter antihistamines such as Zyrtec (cetirizine), Claritin (loratadine), Allegra (fexofenadine), or Xyzal (levocetirizine) daily as needed.   LPRD  (laryngopharyngeal reflux disease) Stable. Unable to wean off meds.  Continue Nexium 40mg  daily. Continue pepcid 40mg  daily.     Bee sting allergy No stings since last visit.  For mild symptoms you can take over the counter antihistamines such as Benadryl and monitor symptoms closely. If symptoms worsen or if you have severe symptoms including breathing issues, throat closure, significant swelling, whole body hives, severe diarrhea and vomiting, lightheadedness then inject epinephrine and seek immediate medical care afterwards. Get bloodwork.  Assessment and Plan: [name] [name] is a 72 y.o. female with: No problem-specific Assessment & Plan notes found for this encounter.  No follow-ups on file.  No orders of the defined types were placed in this encounter.  Lab Orders  No laboratory test(s) ordered today    Diagnostics: Spirometry:  Tracings reviewed. Her effort: {Blank single:19197::"Good reproducible efforts.","It was hard to get consistent efforts and there is a question as to whether this reflects a maximal maneuver.","Poor effort, data can not be interpreted."} FVC: ***L FEV1: ***L, ***% predicted FEV1/FVC ratio: ***% Interpretation: {Blank single:19197::"Spirometry consistent with mild obstructive disease","Spirometry consistent with moderate obstructive disease","Spirometry consistent with severe obstructive disease","Spirometry consistent with possible restrictive disease","Spirometry consistent with mixed obstructive and restrictive disease","Spirometry uninterpretable due to technique","Spirometry consistent with normal pattern","No overt abnormalities noted given today's efforts"}.  Please see scanned spirometry results for details.  Skin Testing: {Blank single:19197::"Select foods","Environmental allergy panel","Environmental allergy panel and select foods","Food allergy panel","None","Deferred due to recent antihistamines use"}. *** Results discussed with  patient/family.   Medication List:  Current Outpatient Medications  Medication Sig Dispense Refill   Accu-Chek FastClix Lancets MISC Use to check blood sugars 3x per day. E11.9 306 each 12  albuterol (PROVENTIL HFA) 108 (90 Base) MCG/ACT inhaler Inhale 2 puffs into the lungs every 4 (four) hours as needed for wheezing or shortness of breath. 1 each 5   Alcohol Swabs (B-D SINGLE USE SWABS REGULAR) PADS USE THREE TIMES DAILY 300 each 3   allopurinol (ZYLOPRIM) 300 MG tablet TAKE 1 TABLET BY MOUTH DAILY 100 tablet 2   ammonium lactate (LAC-HYDRIN) 12 % lotion APPLY 1 APPLICATION TOPICALLY TWICE DAILY AS NEEDED FOR DRY SKIN (SUBSTITUTED FOR LAC HYDRIN) 400 g 12   aspirin EC 81 MG tablet Take 81 mg by mouth daily.     atorvastatin (LIPITOR) 40 MG tablet TAKE 1 TABLET BY MOUTH DAILY 100 tablet 2   B-D ULTRAFINE III SHORT PEN 31G X 8 MM MISC USE TO INJECT THREE TIMES A DAY 90 each 3   Blood Glucose Monitoring Suppl (ACCU-CHEK GUIDE) w/Device KIT USE TO CHECK BLOOD SUGAR 3 TIMES DAILY 1 kit 0   Brimonidine Tartrate-Timolol (COMBIGAN OP) Combigan     budesonide-formoterol (SYMBICORT) 160-4.5 MCG/ACT inhaler Inhale 2 puffs into the lungs in the morning and at bedtime. with spacer and rinse mouth afterwards. 30.6 g 2   buPROPion (WELLBUTRIN) 100 MG tablet Take 1 tablet (100 mg total) by mouth 2 (two) times daily. 180 tablet 1   COMBIGAN 0.2-0.5 % ophthalmic solution INSTILL 1 DROP INTO THE  LEFT EYE EVERY 12 HOURS 5 mL 12   DULoxetine (CYMBALTA) 60 MG capsule Take 1 capsule (60 mg total) by mouth daily. 90 capsule 1   esomeprazole (NEXIUM) 40 MG capsule Take 1 capsule (40 mg total) by mouth daily. 90 capsule 3   famciclovir (FAMVIR) 500 MG tablet Take 1 tablet (500 mg total) by mouth 3 (three) times daily. 21 tablet 0   famotidine (PEPCID) 40 MG tablet Take 1 tablet (40 mg total) by mouth daily. 90 tablet 3   fluticasone (FLONASE) 50 MCG/ACT nasal spray Place 1 spray into both nostrils in the morning  and at bedtime. 48 g 2   furosemide (LASIX) 40 MG tablet Take one tablet by mouth daily 90 tablet 3   glucose blood (ACCU-CHEK GUIDE) test strip USE TO CHECK BLOOD SUGAR 3 TIMES DAILY 300 strip 2   insulin glargine (LANTUS SOLOSTAR) 100 UNIT/ML Solostar Pen INJECT SUBCUTANEOUSLY 35  UNITS TWICE DAILY 75 mL 2   insulin lispro (HUMALOG KWIKPEN) 100 UNIT/ML KwikPen Inject 20 Units into the skin daily after supper. 225 mL 3   Insulin Syringe-Needle U-100 (BD INSULIN SYRINGE ULTRAFINE) 31G X 15/64" 0.5 ML MISC Use to inject three times a day 300 each 2   ipratropium (ATROVENT) 0.03 % nasal spray Place 1-2 sprays into both nostrils 2 (two) times daily as needed (nasal drainage). 30 mL 3   irbesartan (AVAPRO) 300 MG tablet Take 1 tablet (300 mg total) by mouth daily. 90 tablet 3   latanoprost (XALATAN) 0.005 % ophthalmic solution Place 1 drop into both eyes at bedtime. 2.5 mL 3   levocetirizine (XYZAL) 5 MG tablet Take 1 tablet (5 mg total) by mouth every evening. 30 tablet 0   LINZESS 290 MCG CAPS capsule Take 1 capsule (290 mcg total) by mouth daily as needed. 30 capsule    metoprolol succinate (TOPROL-XL) 50 MG 24 hr tablet TAKE 3 TABLETS BY MOUTH DAILY  WITH OR IMMEDIATELY FOLLOWING A  MEAL (Patient taking differently: 50 mg daily. TAKE 3 TABLETS BY MOUTH DAILY  WITH OR IMMEDIATELY FOLLOWING A  MEAL) 300 tablet 3   montelukast (  SINGULAIR) 10 MG tablet TAKE 1 TABLET BY MOUTH DAILY 100 tablet 2   Multiple Vitamins-Minerals (MULTIVITAMIN PO) Take 1 tablet by mouth daily.     REPATHA SURECLICK 140 MG/ML SOAJ INJECT 140MG  SUBCUTANEOUSLY  EVERY 2 WEEKS 6 mL 3   RESTASIS MULTIDOSE 0.05 % ophthalmic emulsion Place 1 drop into both eyes daily as needed.  3   spironolactone (ALDACTONE) 25 MG tablet TAKE 1 TABLET BY MOUTH DAILY  BEFORE SUPPER 100 tablet 3   TRULICITY 1.5 MG/0.5ML SOPN INJECT THE CONTENTS OF ONE PEN  SUBCUTANEOUSLY WEEKLY AS  DIRECTED 6 mL 3   TYRVAYA 0.03 MG/ACT SOLN Place into both nostrils.      No current facility-administered medications for this visit.   Allergies: Allergies  Allergen Reactions   Bee Venom Hives and Swelling   Propoxyphene Hcl Itching   Amlodipine Besylate Swelling   Hydrocodone Other (See Comments)    With Vicodin - makes patient "jittery", and "hangover effect - sleepy next day"   Lisinopril Cough    Changed to ARB   Metformin And Related Diarrhea    GI distress   Metronidazole Other (See Comments)     "just didn't work; my body never did heal from it"   Valsartan Other (See Comments)      "sleep more the next day after I took it; it made me real tired"   Sulfasalazine Other (See Comments)    Other reaction(s): Unknown   Xyzal [Levocetirizine Dihydrochloride] Rash   I reviewed her past medical history, social history, family history, and environmental history and no significant changes have been reported from her previous visit.  Review of Systems  Constitutional:  Negative for appetite change, chills, fever and unexpected weight change.  HENT:  Positive for rhinorrhea. Negative for congestion.   Eyes:  Negative for itching.  Respiratory:  Negative for cough, chest tightness, shortness of breath and wheezing.   Gastrointestinal:  Negative for abdominal pain.  Skin:  Negative for rash.  Allergic/Immunologic: Positive for environmental allergies. Negative for food allergies.  Neurological:  Negative for headaches.    Objective: There were no vitals taken for this visit. There is no height or weight on file to calculate BMI. Physical Exam Vitals and nursing note reviewed.  Constitutional:      Appearance: She is well-developed.  HENT:     Head: Normocephalic and atraumatic.     Right Ear: External ear normal.     Left Ear: External ear normal.     Nose: Nose normal.  Eyes:     Conjunctiva/sclera: Conjunctivae normal.  Cardiovascular:     Rate and Rhythm: Normal rate and regular rhythm.     Heart sounds: Normal heart sounds. No murmur  heard.    No friction rub. No gallop.  Pulmonary:     Effort: Pulmonary effort is normal.     Breath sounds: Normal breath sounds. No wheezing or rales.  Musculoskeletal:     Cervical back: Neck supple.  Skin:    General: Skin is warm.     Findings: No rash.  Neurological:     Mental Status: She is alert and oriented to person, place, and time.  Psychiatric:        Behavior: Behavior normal.    Previous notes and tests were reviewed. The plan was reviewed with the patient/family, and all questions/concerned were addressed.  It was my pleasure to see [name] [name] today and participate in her care. Please feel free to contact me with any  questions or concerns.  Sincerely,  Rexene Alberts, DO Allergy & Immunology  [facility] office: [place] office: [phone]

## 2022-12-11 ENCOUNTER — Ambulatory Visit: Payer: Medicare Other | Admitting: Allergy

## 2022-12-11 NOTE — Progress Notes (Unsigned)
Cardiology Office Note   Date:  12/12/2022   ID:  [name] DumasMargaret Lannette Persichetti, DOB 06/12/1951, MRN [id number]  PCP:  Nestor RampNeal, Sara L, MD  Cardiologist:   Dietrich PatesPaula Kaiyu Mirabal, MD   Pt presents for follow up of HTN    History of Present Illness: [name] [name] is a 72 y.o. female with a history of HTN, HL, PAT  and aortic sclerosis    I saw the pt in March 2023   Since seen the pt says her breathing is good   She notes occasional twinge in chest when laying  Denies chest  tightness     Patient has had some dizziness  Last week occurred when she was walking in house   Felt a little swimmy   Laid down  Went away   Dizzy spells occur every other week   Sometimes can last all day   Denies syncope  She admits to probably not drinking enough fluids     SHe does have BP cuff but says it isnt working right   Does not use  Current Meds  Medication Sig   Accu-Chek FastClix Lancets MISC Use to check blood sugars 3x per day. E11.9   albuterol (PROVENTIL HFA) 108 (90 Base) MCG/ACT inhaler Inhale 2 puffs into the lungs every 4 (four) hours as needed for wheezing or shortness of breath.   Alcohol Swabs (B-D SINGLE USE SWABS REGULAR) PADS USE THREE TIMES DAILY   allopurinol (ZYLOPRIM) 300 MG tablet TAKE 1 TABLET BY MOUTH DAILY   ammonium lactate (LAC-HYDRIN) 12 % lotion APPLY 1 APPLICATION TOPICALLY TWICE DAILY AS NEEDED FOR DRY SKIN (SUBSTITUTED FOR LAC HYDRIN)   aspirin EC 81 MG tablet Take 81 mg by mouth daily.   atorvastatin (LIPITOR) 40 MG tablet TAKE 1 TABLET BY MOUTH DAILY   B-D ULTRAFINE III SHORT PEN 31G X 8 MM MISC USE TO INJECT THREE TIMES A DAY   Blood Glucose Monitoring Suppl (ACCU-CHEK GUIDE) w/Device KIT USE TO CHECK BLOOD SUGAR 3 TIMES DAILY   Brimonidine Tartrate-Timolol (COMBIGAN OP) Combigan   budesonide-formoterol (SYMBICORT) 160-4.5 MCG/ACT inhaler Inhale 2 puffs into the lungs in the morning and at bedtime. with spacer and rinse mouth afterwards.   buPROPion (WELLBUTRIN) 100 MG tablet  Take 1 tablet (100 mg total) by mouth 2 (two) times daily.   COMBIGAN 0.2-0.5 % ophthalmic solution INSTILL 1 DROP INTO THE  LEFT EYE EVERY 12 HOURS   Dextromethorphan-guaiFENesin 10-100 MG/5ML liquid Take by mouth as needed (cough).   DULoxetine (CYMBALTA) 60 MG capsule Take 1 capsule (60 mg total) by mouth daily.   esomeprazole (NEXIUM) 40 MG capsule Take 1 capsule (40 mg total) by mouth daily.   famciclovir (FAMVIR) 500 MG tablet Take 1 tablet (500 mg total) by mouth 3 (three) times daily.   famotidine (PEPCID) 40 MG tablet Take 1 tablet (40 mg total) by mouth daily.   fluticasone (FLONASE) 50 MCG/ACT nasal spray Place 1 spray into both nostrils in the morning and at bedtime.   furosemide (LASIX) 40 MG tablet Take one tablet by mouth daily   glucose blood (ACCU-CHEK GUIDE) test strip USE TO CHECK BLOOD SUGAR 3 TIMES DAILY   insulin glargine (LANTUS SOLOSTAR) 100 UNIT/ML Solostar Pen INJECT SUBCUTANEOUSLY 35  UNITS TWICE DAILY   insulin lispro (HUMALOG KWIKPEN) 100 UNIT/ML KwikPen Inject 20 Units into the skin daily after supper.   Insulin Syringe-Needle U-100 (BD INSULIN SYRINGE ULTRAFINE) 31G X 15/64" 0.5 ML MISC Use to inject three times  a day   ipratropium (ATROVENT) 0.03 % nasal spray Place 1-2 sprays into both nostrils 2 (two) times daily as needed (nasal drainage).   irbesartan (AVAPRO) 300 MG tablet Take 1 tablet (300 mg total) by mouth daily.   latanoprost (XALATAN) 0.005 % ophthalmic solution Place 1 drop into both eyes at bedtime.   levocetirizine (XYZAL) 5 MG tablet Take 1 tablet (5 mg total) by mouth every evening.   LINZESS 290 MCG CAPS capsule Take 1 capsule (290 mcg total) by mouth daily as needed.   metoprolol succinate (TOPROL-XL) 50 MG 24 hr tablet TAKE 3 TABLETS BY MOUTH DAILY  WITH OR IMMEDIATELY FOLLOWING A  MEAL (Patient taking differently: 50 mg daily. TAKE 3 TABLETS BY MOUTH DAILY  WITH OR IMMEDIATELY FOLLOWING A  MEAL)   montelukast (SINGULAIR) 10 MG tablet TAKE 1  TABLET BY MOUTH DAILY   Multiple Vitamins-Minerals (MULTIVITAMIN PO) Take 1 tablet by mouth daily.   REPATHA SURECLICK 140 MG/ML SOAJ INJECT 140MG  SUBCUTANEOUSLY  EVERY 2 WEEKS   RESTASIS MULTIDOSE 0.05 % ophthalmic emulsion Place 1 drop into both eyes daily as needed.   spironolactone (ALDACTONE) 25 MG tablet TAKE 1 TABLET BY MOUTH DAILY  BEFORE SUPPER   TRULICITY 1.5 MG/0.5ML SOPN INJECT THE CONTENTS OF ONE PEN  SUBCUTANEOUSLY WEEKLY AS  DIRECTED   TYRVAYA 0.03 MG/ACT SOLN Place into both nostrils.     Allergies:   Bee venom, Propoxyphene hcl, Amlodipine besylate, Hydrocodone, Lisinopril, Metformin and related, Metronidazole, Valsartan, Sulfasalazine, and Xyzal [levocetirizine dihydrochloride]   Past Medical History:  Diagnosis Date   Allergy    Angina    Anxiety    Arthritis    Asthma    Blood transfusion    Blood transfusion without reported diagnosis    Cataract    Chronic cough    Now followed by pulmonary   Chronic lower back pain    Complication of anesthesia    "just wake up coughing; that's all"   Concussion 11/18/11   "fell at dr's office; hit head"   COPD (chronic obstructive pulmonary disease)    Delayed gastric emptying    Depression    Diabetes mellitus type II    "take insulin & pills"   Dyslipidemia    Exertional dyspnea    GERD (gastroesophageal reflux disease)    Glaucoma    Gout    Headache(784.0) 11/18/11   "I've had mild headaches the last couple days"   Headache(784.0) 01/08/12   "pretty constant since 11/18/11's concussion"   Heart murmur    History of colonoscopy    HTN (hypertension)    Myocardial infarction    Osteoporosis    Peripheral neuropathy    Pneumonia    "i've had it once" (11/18/11)   Sleep apnea    on CPAP 12   Syncope 06/16/2012   Vocal cord paralysis, unilateral partial    "right"    Past Surgical History:  Procedure Laterality Date   ANTERIOR CERVICAL DECOMP/DISCECTOMY FUSION  01/15/2012   Procedure: ANTERIOR CERVICAL  DECOMPRESSION/DISCECTOMY FUSION 3 LEVELS;  Surgeon: Tia Alert, MD;  Location: [facility];  Service: Neurosurgery;  Laterality: N/A;  Anterior Cervical Decompression Discectomy Fusion Cerivcal three four, cervical four five, cervical five six, cervical six seven   BREAST BIOPSY     bilaterally   BREAST CYST EXCISION     right twice; left once   BREAST REDUCTION SURGERY  04-21-2003   BRONCHOSCOPY  07-2007   CARDIAC CATHETERIZATION  03-02-2004  CARPAL TUNNEL RELEASE     left   CATARACT EXTRACTION  1955; 1979   bilateral; right eye   CESAREAN SECTION  1987   COLONOSCOPY W/ BIOPSIES  11/21/2010   adenoma polyp--no hi grade dysplasia [address]   EYE SURGERY     KNEE ARTHROSCOPY  08/2000   right   LEFT AND RIGHT HEART CATHETERIZATION WITH CORONARY ANGIOGRAM N/A 01/19/2014   Procedure: LEFT AND RIGHT HEART CATHETERIZATION WITH CORONARY ANGIOGRAM;  Surgeon: Lesleigh Noe, MD;  Location: [facility] C[facility] LAB;  Service: Cardiovascular;  Laterality: N/A;   REDUCTION MAMMAPLASTY Bilateral    SHOULDER ARTHROSCOPY  08/2000   right   SHOULDER ARTHROSCOPY W/ ROTATOR CUFF REPAIR  10/18/2003   left   SPINE SURGERY     T-score  09/02/04   -0.54 (low risk currently)   TOTAL ABDOMINAL HYSTERECTOMY  10-07-2000   TREATMENT FISTULA ANAL     TUBAL LIGATION  1987     Social History:  The patient  reports that she quit smoking about 47 years ago. Her smoking use included cigarettes. She has a 0.25 pack-year smoking history. She has never used smokeless tobacco. She reports that she does not drink alcohol and does not use drugs.   Family History:  The patient's family history includes Cancer in her sister; Congestive Heart Failure in her brother; Diabetes in her father; Heart disease in her mother; Hyperlipidemia in her brother and sister; Hypertension in her brother, sister, and sister.    ROS:  Please see the history of present illness. All other systems are reviewed and  Negative to the above problem except as  noted.    PHYSICAL EXAM: VS:  BP 120/68   Pulse 79   Ht 5\' 1"  (1.549 m)   Wt 170 lb (77.1 kg)   SpO2 98%   BMI 32.12 kg/m   GEN: Obese 72 yo , in no acute distress  Neck: no JVD Cardiac: RRR; NO signif murmur   Respiratory:  clear to auscultation  GI: soft, nontender, obese   MS: no deformity Moving all extremities   Skin: warm and dry, no rash    EKG:  EKG is ordered today.   SR 79  bpm     Lipid Panel    Component Value Date/Time   CHOL 71 (L) 03/27/2022 0859   TRIG 100 03/27/2022 0859   HDL 34 (L) 03/27/2022 0859   CHOLHDL 2.1 03/27/2022 0859   CHOLHDL 3.7 10/20/2015 1212   VLDL 16 10/20/2015 1212   LDLCALC 18 03/27/2022 0859   LDLDIRECT 68 01/10/2021 1440   LDLDIRECT 104 09/11/2016 1152      Wt Readings from Last 3 Encounters:  12/12/22 170 lb (77.1 kg)  09/05/22 172 lb 6.4 oz (78.2 kg)  06/05/22 175 lb (79.4 kg)      ASSESSMENT AND PLAN: 1  HTN BP is controlled   Keep on current regimen   2  HL  Will recheck lipids today   In 2023 LDL  was 18  HDL 34  Trig 458    3 CAD  Minimal  No CP     4Hx of PAT  Pt without palpitations      5  Dizziness   Intermittent   Not severe but can last a while   Reocmm hydrating   She admits to probably not drinking enough    Take BP when feels this way  Get cuff checked by Dr Jennette Kettle  Will check  TSH   BMET , lipids  Vit D and CBC    4  Minimal CAD   Pt denies CP       5  Dyspnea Breathing is stable without mask    F/U next winter    Current medicines are reviewed at length with the patient today.  The patient does not have concerns regarding medicines.  Signed, Dietrich Pates, MD  12/12/2022 10:24 AM    [facility] [address] [place], [place], [place]  [address] Phone: [phone]; Fax: [phone]

## 2022-12-12 ENCOUNTER — Encounter: Payer: Self-pay | Admitting: Internal Medicine

## 2022-12-12 ENCOUNTER — Ambulatory Visit: Payer: 59 | Attending: Internal Medicine | Admitting: Internal Medicine

## 2022-12-12 VITALS — BP 120/68 | HR 79 | Ht 61.0 in | Wt 170.0 lb

## 2022-12-12 DIAGNOSIS — E1149 Type 2 diabetes mellitus with other diabetic neurological complication: Secondary | ICD-10-CM | POA: Diagnosis not present

## 2022-12-12 DIAGNOSIS — E870 Hyperosmolality and hypernatremia: Secondary | ICD-10-CM | POA: Diagnosis not present

## 2022-12-12 DIAGNOSIS — F330 Major depressive disorder, recurrent, mild: Secondary | ICD-10-CM

## 2022-12-12 DIAGNOSIS — E785 Hyperlipidemia, unspecified: Secondary | ICD-10-CM | POA: Diagnosis not present

## 2022-12-12 DIAGNOSIS — R002 Palpitations: Secondary | ICD-10-CM

## 2022-12-12 DIAGNOSIS — I10 Essential (primary) hypertension: Secondary | ICD-10-CM

## 2022-12-12 DIAGNOSIS — Z79899 Other long term (current) drug therapy: Secondary | ICD-10-CM | POA: Diagnosis not present

## 2022-12-12 NOTE — Patient Instructions (Signed)
Medication Instructions:   *If you need a refill on your cardiac medications before your next appointment, please call your pharmacy*   Lab Work: TSH, BMET, LIPID, CBC, VIT D  If you have labs (blood work) drawn today and your tests are completely normal, you will receive your results only by: MyChart Message (if you have MyChart) OR A paper copy in the mail If you have any lab test that is abnormal or we need to change your treatment, we will call you to review the results.   Testing/Procedures:    Follow-Up: At [facility], you and your health needs are our priority.  As part of our continuing mission to provide you with exceptional heart care, we have created designated Provider Care Teams.  These Care Teams include your primary Cardiologist (physician) and Advanced Practice Providers (APPs -  Physician Assistants and Nurse Practitioners) who all work together to provide you with the care you need, when you need it.  We recommend signing up for the patient portal called "MyChart".  Sign up information is provided on this After Visit Summary.  MyChart is used to connect with patients for Virtual Visits (Telemedicine).  Patients are able to view lab/test results, encounter notes, upcoming appointments, etc.  Non-urgent messages can be sent to your provider as well.   To learn more about what you can do with MyChart, go to [email or web address].    Your next appointment:   6 month(s)  Provider:   Dietrich Pates, MD     Other Instructions  DRINK MORE FLUIDS

## 2022-12-13 ENCOUNTER — Other Ambulatory Visit: Payer: Self-pay

## 2022-12-13 LAB — TSH: TSH: 1.37 u[IU]/mL (ref 0.450–4.500)

## 2022-12-13 LAB — LIPID PANEL
Chol/HDL Ratio: 2.4 ratio (ref 0.0–4.4)
Cholesterol, Total: 93 mg/dL — ABNORMAL LOW (ref 100–199)
HDL: 38 mg/dL — ABNORMAL LOW (ref >39)
LDL Chol Calc (NIH): 37 mg/dL (ref 0–99)
Triglycerides: 95 mg/dL (ref 0–149)
VLDL Cholesterol Cal: 18 mg/dL (ref 5–40)

## 2022-12-13 LAB — BASIC METABOLIC PANEL
BUN/Creatinine Ratio: 14 (ref 12–28)
BUN: 12 mg/dL (ref 8–27)
CO2: 25 mmol/L (ref 20–29)
Calcium: 9.9 mg/dL (ref 8.7–10.3)
Chloride: 106 mmol/L (ref 96–106)
Creatinine, Ser: 0.86 mg/dL (ref 0.57–1.00)
Glucose: 127 mg/dL — ABNORMAL HIGH (ref 70–99)
Potassium: 4.5 mmol/L (ref 3.5–5.2)
Sodium: 141 mmol/L (ref 134–144)
eGFR: 73 mL/min/{1.73_m2} (ref >59)

## 2022-12-13 LAB — CBC
Hematocrit: 35.1 % (ref 34.0–46.6)
Hemoglobin: 11.2 g/dL (ref 11.1–15.9)
MCH: 30.2 pg (ref 26.6–33.0)
MCHC: 31.9 g/dL (ref 31.5–35.7)
MCV: 95 fL (ref 79–97)
Platelets: 194 10*3/uL (ref 150–450)
RBC: 3.71 x10E6/uL — ABNORMAL LOW (ref 3.77–5.28)
RDW: 13.5 % (ref 11.7–15.4)
WBC: 6.7 10*3/uL (ref 3.4–10.8)

## 2022-12-13 LAB — VITAMIN D 25 HYDROXY (VIT D DEFICIENCY, FRACTURES): Vit D, 25-Hydroxy: 26.4 ng/mL — ABNORMAL LOW (ref 30.0–100.0)

## 2022-12-13 MED ORDER — HM VITAMIN D3 100 MCG (4000 UT) PO CAPS: 4000.0000 [IU] | ORAL_CAPSULE | Freq: Every day | ORAL | 3 refills | Status: AC

## 2022-12-18 NOTE — Progress Notes (Unsigned)
Patient ID: [name], female    DOB: May 11, 1951, 72 y.o.   MRN: [id number]   Brief patient profile:   38 yobf never regular smoker quit in the 1977   followed for asthma, sleep apnea, complicated by hx depression, vocal cord paralysis, GERD. NPSG 07/19/10  AHI 7.3/ hr, desaturation to 83%, body weight 186 lbs PFT 07/19/2010-moderate obstructive airways disease, insignificant response to bronchodilator, FEV1/FVC 0.65, TLC 63%, DLCO 71% office spirometry (Allergist Office)  09/09/2018-reviewed. FVC 1.75/86%, FEV1 1.33/84%, ratio 0.76, FEF 25-75% 1.22/78% --------------------------------------------------------------------------------------   11/23/21-  72 year old female former occasional smoker followed for asthma/COPD, OSA, complicated by history Depression, Vocal Cord Paralysis, GERD, Obesity, DM 2, Glaucoma, Covid infection 2021, HTN,  CPAP auto 4-15/[facility]  -Singulair, Astelin, albuterol hfa, Symbicort 160 Download compliance 80%, AHI 1.5/hr Body weight today- Covid vax-5 Phizer Flu vax-had -----Patient is sleeping good, feels like breathing is good. No concerns Using Symbicort daily and averaging rescue inhaler once every week or 2.  No acute events or concerns.  She feels she is very stable place in her life. Download reviewed.  She is comfortable with CPAP.  Admits she leaves it off once in a while to see how it will feel without, but she is meeting goals.  Machine is working well mechanically.  12/19/22- 72 year old female former occasional smoker followed for asthma/COPD, OSA, complicated by history Depression, Vocal Cord Paralysis, GERD, Obesity, DM 2, Glaucoma, Covid infection 2021, HTN,  CPAP auto 4-15/[facility]  -Singulair, Astelin, albuterol hfa, Symbicort 160 Download compliance-  Body weight today-  ROS-see HPI   + = positive Constitutional:    weight loss, night sweats, fevers, chills, fatigue, lassitude. HEENT:    headaches, difficulty swallowing, tooth/dental problems,  sore throat,       sneezing, itching, ear ache, nasal congestion, post nasal drip, snoring CV:    chest pain, orthopnea, PND, swelling in lower extremities, anasarca,                                                           dizziness, palpitations Resp:   shortness of breath with exertion or at rest.                productive cough,   non-productive cough, coughing up of blood.              change in color of mucus.  wheezing.   Skin:    rash or lesions. GI:  No-   heartburn, indigestion, abdominal pain, nausea, vomiting, diarrhea,                 change in bowel habits, loss of appetite GU: dysuria, change in color of urine, no urgency or frequency.   flank pain. MS:   joint pain, stiffness, decreased range of motion, back pain. Neuro-     nothing unusual Psych:  change in mood or affect.  depression or anxiety.   memory loss.  OBJ- Physical Exam General- Alert, Oriented, Affect-appropriate, Distress- none acute, + obese Skin- rash-none, lesions- none, excoriation- none Lymphadenopathy- none Head- atraumatic            Eyes- + impaired/ thick glasses,             Ears- Hearing, canals-normal  Nose- Clear, no-Septal dev, mucus, polyps, erosion, perforation             Throat- Mallampati II , mucosa clear , drainage- none, tonsils- atrophic Neck- flexible , trachea midline, no stridor , thyroid nl, carotid no bruit Chest - symmetrical excursion , unlabored           Heart/CV- RRR , no murmur , no gallop  , no rub, nl s1 s2                           - JVD- none , edema- none, stasis changes- none, varices- none           Lung- clear to P&A, wheeze- none, cough- none , dullness-none, rub- none           Chest wall-  Abd-  Br/ Gen/ Rectal- Not done, not indicated Extrem- + rolling walker Neuro- grossly intact to observation

## 2022-12-19 ENCOUNTER — Ambulatory Visit (INDEPENDENT_AMBULATORY_CARE_PROVIDER_SITE_OTHER): Payer: 59 | Admitting: Internal Medicine

## 2022-12-19 ENCOUNTER — Encounter: Payer: Self-pay | Admitting: Internal Medicine

## 2022-12-19 ENCOUNTER — Other Ambulatory Visit: Payer: Self-pay | Admitting: Family Medicine

## 2022-12-19 VITALS — BP 128/72 | HR 74 | Ht 61.0 in | Wt 168.4 lb

## 2022-12-19 DIAGNOSIS — Z1231 Encounter for screening mammogram for malignant neoplasm of breast: Secondary | ICD-10-CM

## 2022-12-19 DIAGNOSIS — J4540 Moderate persistent asthma, uncomplicated: Secondary | ICD-10-CM | POA: Diagnosis not present

## 2022-12-19 DIAGNOSIS — G4733 Obstructive sleep apnea (adult) (pediatric): Secondary | ICD-10-CM

## 2022-12-19 MED ORDER — BUDESONIDE-FORMOTEROL FUMARATE 160-4.5 MCG/ACT IN AERO: 2.0000 | INHALATION_SPRAY | Freq: Two times a day (BID) | RESPIRATORY_TRACT | 3 refills | Status: DC

## 2022-12-19 MED ORDER — ALBUTEROL SULFATE HFA 108 (90 BASE) MCG/ACT IN AERS: 2.0000 | INHALATION_SPRAY | RESPIRATORY_TRACT | 6 refills | Status: DC | PRN

## 2022-12-19 MED ORDER — FLUTICASONE PROPIONATE 50 MCG/ACT NA SUSP: 1.0000 | Freq: Two times a day (BID) | NASAL | 3 refills | Status: DC

## 2022-12-19 NOTE — Patient Instructions (Addendum)
We are refilling your inhalers.  We can continue CPAP auto 4-12  For dry nose- try otc nasal saline gel- any brand. Use a little in each nostril any time you want, but maybe especially at bedtime.  Please call if we can help

## 2022-12-19 NOTE — Assessment & Plan Note (Signed)
Is been quite good with no recent exacerbation.  Medication goals discussed. Plan-continue current meds.  Refills are sent.

## 2022-12-19 NOTE — Assessment & Plan Note (Signed)
Benefits from CPAP with good compliance and control Plan-continue auto 4-12.  Discussed saline gel to control dry nose complaints.  Encourage use of CPAP every night.

## 2022-12-24 NOTE — Progress Notes (Unsigned)
Follow Up Note  RE: [name] [name] MRN: [id number] DOB: 1951/01/21 Date of Office Visit: 12/25/2022  Referring provider: Nestor Ramp, MD Primary care provider: Nestor Ramp, MD  Chief Complaint: No chief complaint on file.  History of Present Illness: I had the pleasure of seeing [name] [name] for a follow up visit at the [facility] on 12/24/2022. She is a 72 y.o. female, who is being followed for asthma, allergic rhinitis, LPRD, bee sting allergy. Her previous allergy office visit was on 06/07/2022 with Dr. Selena Batten. Today is a regular follow up visit.  Please call patient.  Stinging insect panel was all negative.   Moderate persistent asthma without complication Some DOE. No prednisone use.  Today's spirometry was unremarkable. Daily controller medication(s): continue Symbicort 2 puffs twice a day with spacer and rinse mouth afterwards. Continue Singulair  daily. May use albuterol rescue inhaler 2 puffs every 4 to 6 hours as needed for shortness of breath, chest tightness, coughing, and wheezing. May use albuterol rescue inhaler 2 puffs 5 to 15 minutes prior to strenuous physical activities. Monitor frequency of use.  Get spirometry at next visit.   Seasonal and perennial allergic rhinitis Past history - 2018 skin testing was positive to dust mites and tree pollen. Interim history - rhinorrhea. Continue environmental control measures. Use Atrovent (ipratropium) 0.03% 1-2 sprays per nostril twice a day as needed for runny nose/drainage. Use Flonase (fluticasone) nasal spray 1 spray per nostril twice a day as needed for nasal congestion.  Nasal saline spray (i.e., Simply Saline) or nasal saline lavage (i.e., NeilMed) is recommended as needed and prior to medicated nasal sprays. May use over the counter antihistamines such as Zyrtec (cetirizine), Claritin (loratadine), Allegra (fexofenadine), or Xyzal (levocetirizine) daily as needed.   LPRD  (laryngopharyngeal reflux disease) Stable. Unable to wean off meds.  Continue Nexium  daily. Continue pepcid  daily.     Bee sting allergy No stings since last visit.  For mild symptoms you can take over the counter antihistamines such as Benadryl and monitor symptoms closely. If symptoms worsen or if you have severe symptoms including breathing issues, throat closure, significant swelling, whole body hives, severe diarrhea and vomiting, lightheadedness then inject epinephrine and seek immediate medical care afterwards. Get bloodwork.   Return in about 6 months (around 12/07/2022).  Assessment and Plan: [name] [name] is a 72 y.o. female with: No problem-specific Assessment & Plan notes found for this encounter.  No follow-ups on file.  No orders of the defined types were placed in this encounter.  Lab Orders  No laboratory test(s) ordered today    Diagnostics: Spirometry:  Tracings reviewed. Her effort: {Blank single:19197::"Good reproducible efforts.","It was hard to get consistent efforts and there is a question as to whether this reflects a maximal maneuver.","Poor effort, data can not be interpreted."} FVC: ***L FEV1: ***L, ***% predicted FEV1/FVC ratio: ***% Interpretation: {Blank single:19197::"Spirometry consistent with mild obstructive disease","Spirometry consistent with moderate obstructive disease","Spirometry consistent with severe obstructive disease","Spirometry consistent with possible restrictive disease","Spirometry consistent with mixed obstructive and restrictive disease","Spirometry uninterpretable due to technique","Spirometry consistent with normal pattern","No overt abnormalities noted given today's efforts"}.  Please see scanned spirometry results for details.  Skin Testing: {Blank single:19197::"Select foods","Environmental allergy panel","Environmental allergy panel and select foods","Food allergy panel","None","Deferred due to recent antihistamines  use"}. *** Results discussed with patient/family.   Medication List:  Current Outpatient Medications  Medication Sig Dispense Refill   Accu-Chek FastClix Lancets MISC Use to check blood sugars  3x per day. E11.9 306 each 12   albuterol (PROVENTIL HFA) 108 (90 Base) MCG/ACT inhaler Inhale 2 puffs into the lungs every 4 (four) hours as needed for wheezing or shortness of breath. 1 each 6   Alcohol Swabs (B-D SINGLE USE SWABS REGULAR) PADS USE THREE TIMES DAILY 300 each 3   allopurinol (ZYLOPRIM) 300 MG tablet TAKE 1 TABLET BY MOUTH DAILY 100 tablet 2   ammonium lactate (LAC-HYDRIN) 12 % lotion APPLY 1 APPLICATION TOPICALLY TWICE DAILY AS NEEDED FOR DRY SKIN (SUBSTITUTED FOR LAC HYDRIN) 400 g 12   aspirin EC 81 MG tablet Take 81 mg by mouth daily.     atorvastatin (LIPITOR) 40 MG tablet TAKE 1 TABLET BY MOUTH DAILY 100 tablet 2   B-D ULTRAFINE III SHORT PEN 31G X 8 MM MISC USE TO INJECT THREE TIMES A DAY 90 each 3   Blood Glucose Monitoring Suppl (ACCU-CHEK GUIDE) w/Device KIT USE TO CHECK BLOOD SUGAR 3 TIMES DAILY 1 kit 0   Brimonidine Tartrate-Timolol (COMBIGAN OP) Combigan     budesonide-formoterol (SYMBICORT) 160-4.5 MCG/ACT inhaler Inhale 2 puffs into the lungs in the morning and at bedtime. with spacer and rinse mouth afterwards. 30.6 g 3   buPROPion (WELLBUTRIN) 100 MG tablet Take 1 tablet (100 mg total) by mouth 2 (two) times daily. 180 tablet 1   Cholecalciferol (HM VITAMIN D3) 100 MCG (4000 UT) CAPS Take 1 capsule (4,000 Units total) by mouth daily in the afternoon. 100 capsule 3   COMBIGAN 0.2-0.5 % ophthalmic solution INSTILL 1 DROP INTO THE  LEFT EYE EVERY 12 HOURS 5 mL 12   Dextromethorphan-guaiFENesin 10-100 MG/5ML liquid Take by mouth as needed (cough).     DULoxetine (CYMBALTA) 60 MG capsule Take 1 capsule (60 mg total) by mouth daily. 90 capsule 1   esomeprazole (NEXIUM) 40 MG capsule Take 1 capsule (40 mg total) by mouth daily. 90 capsule 3   famciclovir (FAMVIR) 500 MG tablet  Take 1 tablet (500 mg total) by mouth 3 (three) times daily. 21 tablet 0   famotidine (PEPCID) 40 MG tablet Take 1 tablet (40 mg total) by mouth daily. 90 tablet 3   fluticasone (FLONASE) 50 MCG/ACT nasal spray Place 1 spray into both nostrils in the morning and at bedtime. 48 g 3   furosemide (LASIX) 40 MG tablet Take one tablet by mouth daily 90 tablet 3   glucose blood (ACCU-CHEK GUIDE) test strip USE TO CHECK BLOOD SUGAR 3 TIMES DAILY 300 strip 2   insulin glargine (LANTUS SOLOSTAR) 100 UNIT/ML Solostar Pen INJECT SUBCUTANEOUSLY 35  UNITS TWICE DAILY 75 mL 2   insulin lispro (HUMALOG KWIKPEN) 100 UNIT/ML KwikPen Inject 20 Units into the skin daily after supper. 225 mL 3   Insulin Syringe-Needle U-100 (BD INSULIN SYRINGE ULTRAFINE) 31G X 15/64" 0.5 ML MISC Use to inject three times a day 300 each 2   ipratropium (ATROVENT) 0.03 % nasal spray Place 1-2 sprays into both nostrils 2 (two) times daily as needed (nasal drainage). 30 mL 3   irbesartan (AVAPRO) 300 MG tablet Take 1 tablet (300 mg total) by mouth daily. 90 tablet 3   latanoprost (XALATAN) 0.005 % ophthalmic solution Place 1 drop into both eyes at bedtime. 2.5 mL 3   levocetirizine (XYZAL) 5 MG tablet Take 1 tablet (5 mg total) by mouth every evening. 30 tablet 0   LINZESS 290 MCG CAPS capsule Take 1 capsule (290 mcg total) by mouth daily as needed. 30 capsule  metoprolol succinate (TOPROL-XL) 50 MG 24 hr tablet TAKE 3 TABLETS BY MOUTH DAILY  WITH OR IMMEDIATELY FOLLOWING A  MEAL (Patient taking differently: 50 mg daily. TAKE 3 TABLETS BY MOUTH DAILY  WITH OR IMMEDIATELY FOLLOWING A  MEAL) 300 tablet 3   montelukast (SINGULAIR) 10 MG tablet TAKE 1 TABLET BY MOUTH DAILY 100 tablet 2   Multiple Vitamins-Minerals (MULTIVITAMIN PO) Take 1 tablet by mouth daily.     REPATHA SURECLICK 140 MG/ML SOAJ INJECT 140MG  SUBCUTANEOUSLY  EVERY 2 WEEKS 6 mL 3   RESTASIS MULTIDOSE 0.05 % ophthalmic emulsion Place 1 drop into both eyes daily as needed.  3    spironolactone (ALDACTONE) 25 MG tablet TAKE 1 TABLET BY MOUTH DAILY  BEFORE SUPPER 100 tablet 3   TRULICITY 1.5 MG/0.5ML SOPN INJECT THE CONTENTS OF ONE PEN  SUBCUTANEOUSLY WEEKLY AS  DIRECTED 6 mL 3   TYRVAYA 0.03 MG/ACT SOLN Place into both nostrils.     No current facility-administered medications for this visit.   Allergies: Allergies  Allergen Reactions   Bee Venom Hives and Swelling   Propoxyphene Hcl Itching   Amlodipine Besylate Swelling   Hydrocodone Other (See Comments)    With Vicodin - makes patient "jittery", and "hangover effect - sleepy next day"   Lisinopril Cough    Changed to ARB   Metformin And Related Diarrhea    GI distress   Metronidazole Other (See Comments)     "just didn't work; my body never did heal from it"   Valsartan Other (See Comments)      "sleep more the next day after I took it; it made me real tired"   Sulfasalazine Other (See Comments)    Other reaction(s): Unknown   Xyzal [Levocetirizine Dihydrochloride] Rash   I reviewed her past medical history, social history, family history, and environmental history and no significant changes have been reported from her previous visit.  Review of Systems  Constitutional:  Negative for appetite change, chills, fever and unexpected weight change.  HENT:  Positive for rhinorrhea. Negative for congestion.   Eyes:  Negative for itching.  Respiratory:  Negative for cough, chest tightness, shortness of breath and wheezing.   Gastrointestinal:  Negative for abdominal pain.  Skin:  Negative for rash.  Allergic/Immunologic: Positive for environmental allergies. Negative for food allergies.  Neurological:  Negative for headaches.    Objective: There were no vitals taken for this visit. There is no height or weight on file to calculate BMI. Physical Exam Vitals and nursing note reviewed.  Constitutional:      Appearance: She is well-developed.  HENT:     Head: Normocephalic and atraumatic.     Right  Ear: External ear normal.     Left Ear: External ear normal.     Nose: Nose normal.  Eyes:     Conjunctiva/sclera: Conjunctivae normal.  Cardiovascular:     Rate and Rhythm: Normal rate and regular rhythm.     Heart sounds: Normal heart sounds. No murmur heard.    No friction rub. No gallop.  Pulmonary:     Effort: Pulmonary effort is normal.     Breath sounds: Normal breath sounds. No wheezing or rales.  Musculoskeletal:     Cervical back: Neck supple.  Skin:    General: Skin is warm.     Findings: No rash.  Neurological:     Mental Status: She is alert and oriented to person, place, and time.  Psychiatric:  Behavior: Behavior normal.    Previous notes and tests were reviewed. The plan was reviewed with the patient/family, and all questions/concerned were addressed.  It was my pleasure to see [name] [name] today and participate in her care. Please feel free to contact me with any questions or concerns.  Sincerely,  Wyline Mood, DO Allergy & Immunology  [facility] office: [phone] [facility] office: [phone]

## 2022-12-25 ENCOUNTER — Other Ambulatory Visit: Payer: Self-pay

## 2022-12-25 ENCOUNTER — Ambulatory Visit (INDEPENDENT_AMBULATORY_CARE_PROVIDER_SITE_OTHER): Payer: 59 | Admitting: Allergy

## 2022-12-25 ENCOUNTER — Encounter: Payer: Self-pay | Admitting: Allergy

## 2022-12-25 VITALS — BP 140/84 | HR 70 | Temp 98.1°F | Resp 16 | Ht 61.0 in | Wt 166.5 lb

## 2022-12-25 DIAGNOSIS — J3089 Other allergic rhinitis: Secondary | ICD-10-CM | POA: Diagnosis not present

## 2022-12-25 DIAGNOSIS — J302 Other seasonal allergic rhinitis: Secondary | ICD-10-CM

## 2022-12-25 DIAGNOSIS — J4540 Moderate persistent asthma, uncomplicated: Secondary | ICD-10-CM | POA: Diagnosis not present

## 2022-12-25 DIAGNOSIS — Z91030 Bee allergy status: Secondary | ICD-10-CM

## 2022-12-25 DIAGNOSIS — K219 Gastro-esophageal reflux disease without esophagitis: Secondary | ICD-10-CM | POA: Diagnosis not present

## 2022-12-25 MED ORDER — EPINEPHRINE 0.3 MG/0.3ML IJ SOAJ: 0.3000 mg | INTRAMUSCULAR | 1 refills | Status: DC | PRN

## 2022-12-25 MED ORDER — AZELASTINE HCL 0.1 % NA SOLN: 1.0000 | Freq: Two times a day (BID) | NASAL | 5 refills | Status: DC | PRN

## 2022-12-25 MED ORDER — MONTELUKAST SODIUM 10 MG PO TABS: 10.0000 mg | ORAL_TABLET | Freq: Every day | ORAL | 3 refills | Status: DC

## 2022-12-25 NOTE — Assessment & Plan Note (Signed)
Interim history - 2023 bloodwork negative to hymenoptera panel, tryptase 8.4. For mild symptoms you can take over the counter antihistamines such as Benadryl and monitor symptoms closely. If symptoms worsen or if you have severe symptoms including breathing issues, throat closure, significant swelling, whole body hives, severe diarrhea and vomiting, lightheadedness then inject epinephrine and seek immediate medical care afterwards.

## 2022-12-25 NOTE — Assessment & Plan Note (Signed)
Some DOE. No prednisone since last visit.  Today's spirometry was unremarkable. Daily controller medication(s): continue Symbicort 2 puffs twice a day with spacer and rinse mouth afterwards. Continue Singulair  daily  May use albuterol rescue inhaler 2 puffs every 4 to 6 hours as needed for shortness of breath, chest tightness, coughing, and wheezing. May use albuterol rescue inhaler 2 puffs 5 to 15 minutes prior to strenuous physical activities. Monitor frequency of use.  Get spirometry at next visit.

## 2022-12-25 NOTE — Assessment & Plan Note (Signed)
Stable. Unable to wean off meds.   Continue Nexium 40mg daily.  Continue pepcid 40mg daily.   

## 2022-12-25 NOTE — Assessment & Plan Note (Signed)
Past history - 2018 skin testing was positive to dust mites and tree pollen. Interim history - rhinorrhea worse. Atrovent not effective anymore.  Continue environmental control measures. Stop Atrovent (ipratropium). Use azelastine nasal spray 1-2 sprays per nostril twice a day as needed for runny nose/drainage. Use Flonase (fluticasone) nasal spray 1 spray per nostril twice a day as needed for nasal congestion.  Nasal saline spray (i.e., Simply Saline) or nasal saline lavage (i.e., NeilMed) is recommended as needed and prior to medicated nasal sprays. No antihistamines due to past issues with dehydration.

## 2022-12-25 NOTE — Patient Instructions (Addendum)
Moderate persistent asthma  Daily controller medication(s): continue Symbicort 2 puffs twice a day with spacer and rinse mouth afterwards. Continue Singulair  daily  May use albuterol rescue inhaler 2 puffs every 4 to 6 hours as needed for shortness of breath, chest tightness, coughing, and wheezing. May use albuterol rescue inhaler 2 puffs 5 to 15 minutes prior to strenuous physical activities. Monitor frequency of use.  Asthma control goals:  Full participation in all desired activities (may need albuterol before activity) Albuterol use two times or less a week on average (not counting use with activity) Cough interfering with sleep two times or less a month Oral steroids no more than once a year No hospitalizations   Seasonal and perennial allergic rhinitis 2018 skin testing was positive to dust mites and tree pollen. Continue environmental control measures. Stop Atrovent (ipratropium. Use azelastine nasal spray 1-2 sprays per nostril twice a day as needed for runny nose/drainage.  Use Flonase (fluticasone) nasal spray 1 spray per nostril twice a day as needed for nasal congestion.  Nasal saline spray (i.e., Simply Saline) or nasal saline lavage (i.e., NeilMed) is recommended as needed and prior to medicated nasal sprays.   LPRD (laryngopharyngeal reflux disease) Continue Nexium  daily. Continue Pepcid  daily.     Bee sting allergy Bloodwork was negative.  For mild symptoms you can take over the counter antihistamines such as Benadryl and monitor symptoms closely. If symptoms worsen or if you have severe symptoms including breathing issues, throat closure, significant swelling, whole body hives, severe diarrhea and vomiting, lightheadedness then inject epinephrine and seek immediate medical care afterwards.  Follow up in 6 months or sooner if needed.

## 2023-01-08 ENCOUNTER — Ambulatory Visit (INDEPENDENT_AMBULATORY_CARE_PROVIDER_SITE_OTHER): Payer: 59 | Admitting: Family Medicine

## 2023-01-08 ENCOUNTER — Encounter: Payer: Self-pay | Admitting: Family Medicine

## 2023-01-08 VITALS — BP 108/60 | HR 79 | Ht 61.0 in | Wt 168.4 lb

## 2023-01-08 DIAGNOSIS — K219 Gastro-esophageal reflux disease without esophagitis: Secondary | ICD-10-CM

## 2023-01-08 DIAGNOSIS — E785 Hyperlipidemia, unspecified: Secondary | ICD-10-CM | POA: Diagnosis not present

## 2023-01-08 DIAGNOSIS — G4733 Obstructive sleep apnea (adult) (pediatric): Secondary | ICD-10-CM

## 2023-01-08 DIAGNOSIS — E1149 Type 2 diabetes mellitus with other diabetic neurological complication: Secondary | ICD-10-CM | POA: Diagnosis not present

## 2023-01-08 DIAGNOSIS — F411 Generalized anxiety disorder: Secondary | ICD-10-CM | POA: Diagnosis not present

## 2023-01-08 DIAGNOSIS — R269 Unspecified abnormalities of gait and mobility: Secondary | ICD-10-CM | POA: Diagnosis not present

## 2023-01-08 DIAGNOSIS — F330 Major depressive disorder, recurrent, mild: Secondary | ICD-10-CM

## 2023-01-08 DIAGNOSIS — I10 Essential (primary) hypertension: Secondary | ICD-10-CM | POA: Diagnosis not present

## 2023-01-08 LAB — POCT GLYCOSYLATED HEMOGLOBIN (HGB A1C): HbA1c, POC (controlled diabetic range): 6.2 % (ref 0.0–7.0)

## 2023-01-08 MED ORDER — ACYCLOVIR 200 MG PO CAPS: ORAL_CAPSULE | ORAL | 3 refills | Status: DC

## 2023-01-08 MED ORDER — METOPROLOL SUCCINATE ER 50 MG PO TB24: ORAL_TABLET | ORAL | 3 refills | Status: DC

## 2023-01-08 MED ORDER — METOCLOPRAMIDE HCL 5 MG PO TABS
ORAL_TABLET | ORAL | 3 refills | Status: DC
Start: 2023-01-08 — End: 2024-01-21

## 2023-01-08 MED ORDER — DULOXETINE HCL 60 MG PO CPEP: 60.0000 mg | ORAL_CAPSULE | Freq: Every day | ORAL | 1 refills | Status: DC

## 2023-01-08 MED ORDER — FLUTICASONE PROPIONATE 50 MCG/ACT NA SUSP: 1.0000 | Freq: Two times a day (BID) | NASAL | 3 refills | Status: DC

## 2023-01-08 MED ORDER — ESOMEPRAZOLE MAGNESIUM 40 MG PO CPDR: 40.0000 mg | DELAYED_RELEASE_CAPSULE | Freq: Every day | ORAL | 3 refills | Status: DC

## 2023-01-08 MED ORDER — AMMONIUM LACTATE 12 % EX LOTN: TOPICAL_LOTION | CUTANEOUS | 12 refills | Status: DC

## 2023-01-08 MED ORDER — LINZESS 290 MCG PO CAPS: 290.0000 ug | ORAL_CAPSULE | Freq: Every day | ORAL | Status: DC | PRN

## 2023-01-08 MED ORDER — ATORVASTATIN CALCIUM 40 MG PO TABS: 40.0000 mg | ORAL_TABLET | Freq: Every day | ORAL | 2 refills | Status: DC

## 2023-01-08 MED ORDER — ALLOPURINOL 300 MG PO TABS: 300.0000 mg | ORAL_TABLET | Freq: Every day | ORAL | 2 refills | Status: AC

## 2023-01-08 MED ORDER — FUROSEMIDE 40 MG PO TABS: ORAL_TABLET | ORAL | 3 refills | Status: AC

## 2023-01-08 MED ORDER — FAMOTIDINE 40 MG PO TABS: 40.0000 mg | ORAL_TABLET | Freq: Every day | ORAL | 3 refills | Status: DC

## 2023-01-08 NOTE — Assessment & Plan Note (Signed)
Still seems relatively stable on her walker.

## 2023-01-08 NOTE — Assessment & Plan Note (Signed)
Blood pressure seems well-controlled.  Will check some labs today.  She is otherwise doing pretty well from cardiovascular standpoint.  She also follows with cardiology.

## 2023-01-08 NOTE — Assessment & Plan Note (Signed)
No problems with taking her medication for her cholesterol.  Does not need refills of that today.

## 2023-01-08 NOTE — Progress Notes (Signed)
    CHIEF COMPLAINT / HPI: #1.  Follow-up diabetes mellitus: Taking her insulin per her and regimen.  Has been feeling pretty well although she does not have much energy. 2.  Needs refills.  She saw the allergist and they made some medication changes. 3.  Laryngeal pharyngeal reflux disease also followed by allergy, they have been unable to wean her off her meds so she continues on Nexium and Pepcid.  Is essentially asymptomatic as long as she takes her medications. #4.  Hypertension: Taking her medicines regularly.  No episodes of chest pain or shortness of breath.  No lower extremity swelling. 5.  Energy level is lower than she would like.  No specific change but just a general decline.  She reports compliance with her CPAP.   PERTINENT  PMH / PSH: I have reviewed the patient's medications, allergies, past medical and surgical history, smoking status and updated in the EMR as appropriate.   OBJECTIVE:  BP 108/60   Pulse 79   Ht 5\' 1"  (1.549 m)   Wt 168 lb 6.4 oz (76.4 kg)   SpO2 98%   BMI 31.82 kg/m  general: Well-developed female no acute distress HEENT: Blind.  Resting nystagmus that is horizontal. Neck: Supple.  Some stiffness secondary to prior fusion. CV: Regular rate and rhythm LUNGS: Clear to auscultation bilaterally.  Normal work of breathing. EXTREMITY: Normal muscle bulk and tone.  She is walking with a walker.  She can rise from a chair with some minimal assistance from her walker.  Her gait is with a stooped forward posture and somewhat shortened stride.  A little unsteady until she gets going then she is doing pretty well.  ASSESSMENT / PLAN:   Obstructive sleep apnea She reports compliance with her CPAP.  She also follows with pulmonology.  HYPERTENSION, BENIGN SYSTEMIC Blood pressure seems well-controlled.  Will check some labs today.  She is otherwise doing pretty well from cardiovascular standpoint.  She also follows with cardiology.  Hyperlipidemia No  problems with taking her medication for her cholesterol.  Does not need refills of that today.  Diabetes mellitus type 2 with neurological manifestations (HCC) Remarkably, her A1c looks really great today.  It is remarkable because she manages her insulin by her own regimen.  We have discussed this several times.  What ever she is doing seems to work if she is not having any episodes of low blood sugar.  I will make no changes in what she is doing.  Follow-up 3 months.  LPRD (laryngopharyngeal reflux disease) Seems stable.  Medication changes are updated in her chart with [place].  GAIT IMBALANCE Still seems relatively stable on her walker.  Major depressive disorder, recurrent episode (HCC) She continues to follow with psychiatry regarding her mood disorder.  She says she is taking all of her medicines and we reviewed those at length today.  Medication list is updated.   Denny Levy MD

## 2023-01-08 NOTE — Patient Instructions (Addendum)
I have refilled your medicines. Great job on your A1C!! Let me see you in 3 months. Great to see you!

## 2023-01-08 NOTE — Assessment & Plan Note (Signed)
Seems stable.  Medication changes are updated in her chart with [place].

## 2023-01-08 NOTE — Assessment & Plan Note (Signed)
She continues to follow with psychiatry regarding her mood disorder.  She says she is taking all of her medicines and we reviewed those at length today.  Medication list is updated.

## 2023-01-08 NOTE — Assessment & Plan Note (Signed)
Remarkably, her A1c looks really great today.  It is remarkable because she manages her insulin by her own regimen.  We have discussed this several times.  What ever she is doing seems to work if she is not having any episodes of low blood sugar.  I will make no changes in what she is doing.  Follow-up 3 months.

## 2023-01-08 NOTE — Assessment & Plan Note (Signed)
She reports compliance with her CPAP.  She also follows with pulmonology.

## 2023-01-30 ENCOUNTER — Ambulatory Visit
Admission: RE | Admit: 2023-01-30 | Discharge: 2023-01-30 | Disposition: A | Discharge status: Home / Self Care | Payer: 59 | Source: Ambulatory Visit | Attending: Family Medicine | Admitting: Family Medicine

## 2023-01-30 DIAGNOSIS — Z1231 Encounter for screening mammogram for malignant neoplasm of breast: Secondary | ICD-10-CM

## 2023-02-01 ENCOUNTER — Other Ambulatory Visit: Payer: Self-pay | Admitting: Family Medicine

## 2023-02-01 DIAGNOSIS — E1149 Type 2 diabetes mellitus with other diabetic neurological complication: Secondary | ICD-10-CM

## 2023-02-04 ENCOUNTER — Ambulatory Visit (INDEPENDENT_AMBULATORY_CARE_PROVIDER_SITE_OTHER): Payer: 59 | Admitting: Podiatry

## 2023-02-04 ENCOUNTER — Encounter: Payer: Self-pay | Admitting: Podiatry

## 2023-02-04 VITALS — BP 110/55

## 2023-02-04 DIAGNOSIS — M79675 Pain in left toe(s): Secondary | ICD-10-CM

## 2023-02-04 DIAGNOSIS — B351 Tinea unguium: Secondary | ICD-10-CM

## 2023-02-04 DIAGNOSIS — M2041 Other hammer toe(s) (acquired), right foot: Secondary | ICD-10-CM | POA: Diagnosis not present

## 2023-02-04 DIAGNOSIS — M79674 Pain in right toe(s): Secondary | ICD-10-CM

## 2023-02-04 DIAGNOSIS — E119 Type 2 diabetes mellitus without complications: Secondary | ICD-10-CM | POA: Diagnosis not present

## 2023-02-04 DIAGNOSIS — M2142 Flat foot [pes planus] (acquired), left foot: Secondary | ICD-10-CM

## 2023-02-04 DIAGNOSIS — M2141 Flat foot [pes planus] (acquired), right foot: Secondary | ICD-10-CM

## 2023-02-04 DIAGNOSIS — M2042 Other hammer toe(s) (acquired), left foot: Secondary | ICD-10-CM

## 2023-02-04 DIAGNOSIS — L84 Corns and callosities: Secondary | ICD-10-CM

## 2023-02-04 DIAGNOSIS — E1142 Type 2 diabetes mellitus with diabetic polyneuropathy: Secondary | ICD-10-CM | POA: Diagnosis not present

## 2023-02-09 NOTE — Progress Notes (Signed)
ANNUAL DIABETIC FOOT EXAM  Subjective: [name] [name] presents today annual diabetic foot exam. Chief Complaint  Patient presents with   Nail Problem    Referring Provider Nestor Ramp, MD,LOV:05/24,A1C:6..2,BS:      Patient confirms h/o diabetes.  Patient denies any h/o foot wounds.  Patient has been diagnosed with neuropathy.  Risk factors: diabetes, diabetic neuropathy, history of gout, h/o MI, HTN, COPD, hyperlipidemia, h/o tobacco use in remission.  Nestor Ramp, MD is patient's PCP.  Past Medical History:  Diagnosis Date   Allergy    Angina    Anxiety    Arthritis    Asthma    Blood transfusion    Blood transfusion without reported diagnosis    Cataract    Chronic cough    Now followed by pulmonary   Chronic lower back pain    Complication of anesthesia    "just wake up coughing; that's all"   Concussion 11/18/11   "fell at dr's office; hit head"   COPD (chronic obstructive pulmonary disease) (HCC)    Delayed gastric emptying    Depression    Diabetes mellitus type II    "take insulin & pills"   Dyslipidemia    Exertional dyspnea    GERD (gastroesophageal reflux disease)    Glaucoma    Gout    Headache(784.0) 11/18/11   "I've had mild headaches the last couple days"   Headache(784.0) 01/08/12   "pretty constant since 11/18/11's concussion"   Heart murmur    History of colonoscopy    HTN (hypertension)    Myocardial infarction (HCC)    Osteoporosis    Peripheral neuropathy    Pneumonia    "i've had it once" (11/18/11)   Sleep apnea    on CPAP 12   Syncope 06/16/2012   Vocal cord paralysis, unilateral partial    "right"   Patient Active Problem List   Diagnosis Date Noted   Fatty liver 12/12/2021   History of colonic polyps 12/12/2021   Healthcare maintenance 11/08/2021   Chronic idiopathic constipation 09/10/2021   Diverticular disease of colon 09/10/2021   Hair loss 01/10/2021   Moderate persistent asthma without complication  03/10/2019   Seasonal and perennial allergic rhinitis 09/09/2018   LPRD (laryngopharyngeal reflux disease) 09/09/2018   Delayed gastric emptying 01/23/2017   Hoarseness of voice 01/16/2017   Subjective tinnitus of both ears 01/16/2017   History of wrist fracture, left 03/06/2016   Bee sting allergy 06/30/2014   Chronic headaches 12/27/2013   Chronic flank pain 10/07/2012   Fusion of spine of cervical region 06/03/2012   Acquired cyst of kidney 05/28/2012   Incomplete bladder emptying 05/28/2012   Legally BLIND, bilateral 02/05/2012   Nail dystrophy 11/15/2011   Hip arthritis, bilateral hip pain 05/29/2011   DJD (degenerative joint disease) of knee 05/01/2011   Allergic rhinitis due to pollen 01/24/2011   Neuropathy 11/08/2010   GAIT IMBALANCE 05/30/2010   CARPAL TUNNEL SYNDROME, BILATERAL 12/28/2009   Hyperlipidemia 05/01/2009   HERPES SIMPLEX INFECTION, RECURRENT 09/14/2008   NEURAL HEARING LOSS BILATERAL 03/02/2008   Cataract extraction status 09/10/2007   Unilateral complete paralysis of vocal cord 02/17/2007   GERD 12/12/2006   Diabetes mellitus type 2 with neurological manifestations (HCC) 10/30/2006   Major depressive disorder, recurrent episode (HCC) 10/30/2006   Obstructive sleep apnea 10/30/2006   Glaucoma 10/30/2006   HYPERTENSION, BENIGN SYSTEMIC 10/30/2006   Past Surgical History:  Procedure Laterality Date   ANTERIOR CERVICAL DECOMP/DISCECTOMY FUSION  01/15/2012  Procedure: ANTERIOR CERVICAL DECOMPRESSION/DISCECTOMY FUSION 3 LEVELS;  Surgeon: Tia Alert, MD;  Location: [facility];  Service: Neurosurgery;  Laterality: N/A;  Anterior Cervical Decompression Discectomy Fusion Cerivcal three four, cervical four five, cervical five six, cervical six seven   BREAST BIOPSY     bilaterally   BREAST CYST EXCISION     right twice; left once   BREAST REDUCTION SURGERY  04-21-2003   BRONCHOSCOPY  07-2007   CARDIAC CATHETERIZATION  03-02-2004   CARPAL TUNNEL RELEASE      left   CATARACT EXTRACTION  1955; 1979   bilateral; right eye   CESAREAN SECTION  1987   COLONOSCOPY W/ BIOPSIES  11/21/2010   adenoma polyp--no hi grade dysplasia [address]   EYE SURGERY     KNEE ARTHROSCOPY  08/2000   right   LEFT AND RIGHT HEART CATHETERIZATION WITH CORONARY ANGIOGRAM N/A 01/19/2014   Procedure: LEFT AND RIGHT HEART CATHETERIZATION WITH CORONARY ANGIOGRAM;  Surgeon: Lesleigh Noe, MD;  Location: [facility] CATH LAB;  Service: Cardiovascular;  Laterality: N/A;   REDUCTION MAMMAPLASTY Bilateral    SHOULDER ARTHROSCOPY  08/2000   right   SHOULDER ARTHROSCOPY W/ ROTATOR CUFF REPAIR  10/18/2003   left   SPINE SURGERY     T-score  09/02/04   -0.54 (low risk currently)   TOTAL ABDOMINAL HYSTERECTOMY  10-07-2000   TREATMENT FISTULA ANAL     TUBAL LIGATION  1987   Current Outpatient Medications on File Prior to Visit  Medication Sig Dispense Refill   Accu-Chek FastClix Lancets MISC Use to check blood sugars 3x per day. E11.9 306 each 12   acyclovir (ZOVIRAX) 200 MG capsule Take one by mouth daily for prophylaxis 90 capsule 3   albuterol (PROVENTIL HFA) 108 (90 Base) MCG/ACT inhaler Inhale 2 puffs into the lungs every 4 (four) hours as needed for wheezing or shortness of breath. 1 each 6   Alcohol Swabs (B-D SINGLE USE SWABS REGULAR) PADS USE THREE TIMES DAILY 300 each 3   allopurinol (ZYLOPRIM) 300 MG tablet Take 1 tablet (300 mg total) by mouth daily. 100 tablet 2   ammonium lactate (LAC-HYDRIN) 12 % lotion APPLY 1 APPLICATION TOPICALLY TWICE DAILY AS NEEDED FOR DRY SKIN (SUBSTITUTED FOR LAC HYDRIN) 400 g 12   aspirin EC 81 MG tablet Take 81 mg by mouth daily.     atorvastatin (LIPITOR) 40 MG tablet Take 1 tablet (40 mg total) by mouth daily. 100 tablet 2   B-D ULTRAFINE III SHORT PEN 31G X 8 MM MISC USE TO INJECT THREE TIMES A DAY 90 each 3   Blood Glucose Monitoring Suppl (ACCU-CHEK GUIDE) w/Device KIT USE TO CHECK BLOOD SUGAR 3 TIMES DAILY 1 kit 0   Brimonidine Tartrate-Timolol  (COMBIGAN OP) Combigan     budesonide-formoterol (SYMBICORT) 160-4.5 MCG/ACT inhaler Inhale 2 puffs into the lungs in the morning and at bedtime. with spacer and rinse mouth afterwards. 30.6 g 3   buPROPion (WELLBUTRIN) 100 MG tablet Take 1 tablet (100 mg total) by mouth 2 (two) times daily. 180 tablet 1   Cholecalciferol (HM VITAMIN D3) 100 MCG (4000 UT) CAPS Take 1 capsule (4,000 Units total) by mouth daily in the afternoon. 100 capsule 3   COMBIGAN 0.2-0.5 % ophthalmic solution INSTILL 1 DROP INTO THE  LEFT EYE EVERY 12 HOURS 5 mL 12   DULoxetine (CYMBALTA) 60 MG capsule Take 1 capsule (60 mg total) by mouth daily. 90 capsule 1   EPINEPHrine 0.3 mg/0.3 mL  IJ SOAJ injection Inject 0.3 mg into the muscle as needed for anaphylaxis. 2 each 1   esomeprazole (NEXIUM) 40 MG capsule Take 1 capsule (40 mg total) by mouth daily. 90 capsule 3   famciclovir (FAMVIR) 500 MG tablet Take 1 tablet (500 mg total) by mouth 3 (three) times daily. 21 tablet 0   famotidine (PEPCID) 40 MG tablet Take 1 tablet (40 mg total) by mouth daily. 90 tablet 3   fluticasone (FLONASE) 50 MCG/ACT nasal spray Place 1 spray into both nostrils in the morning and at bedtime. 48 g 3   furosemide (LASIX) 40 MG tablet Take one tablet by mouth daily PRN 90 tablet 3   glucose blood (ACCU-CHEK GUIDE) test strip USE TO CHECK BLOOD SUGAR 3 TIMES DAILY 300 strip 2   HUMALOG KWIKPEN 100 UNIT/ML KwikPen INJECT SUBCUTANEOUSLY 35 UNITS  TWICE DAILY AFTER MEALS AS  DIRECTED 75 mL 2   Insulin Syringe-Needle U-100 (BD INSULIN SYRINGE ULTRAFINE) 31G X 15/64" 0.5 ML MISC Use to inject three times a day 300 each 2   irbesartan (AVAPRO) 300 MG tablet Take 1 tablet (300 mg total) by mouth daily. 90 tablet 3   LANTUS SOLOSTAR 100 UNIT/ML Solostar Pen INJECT SUBCUTANEOUSLY 35 UNITS  TWICE DAILY 75 mL 2   latanoprost (XALATAN) 0.005 % ophthalmic solution Place 1 drop into both eyes at bedtime. 2.5 mL 3   LINZESS 290 MCG CAPS capsule Take 1 capsule (290 mcg  total) by mouth daily as needed. 30 capsule    metoCLOPramide (REGLAN) 5 MG tablet Take one by mouth twice a day as directed 180 tablet 3   metoprolol succinate (TOPROL-XL) 50 MG 24 hr tablet TAKE 1 TABLETS BY MOUTH DAILY  WITH OR IMMEDIATELY FOLLOWING A  MEAL 300 tablet 3   montelukast (SINGULAIR) 10 MG tablet Take 1 tablet (10 mg total) by mouth daily. 100 tablet 3   Multiple Vitamins-Minerals (MULTIVITAMIN PO) Take 1 tablet by mouth daily.     REPATHA SURECLICK 140 MG/ML SOAJ INJECT 140MG  SUBCUTANEOUSLY  EVERY 2 WEEKS 6 mL 3   RESTASIS MULTIDOSE 0.05 % ophthalmic emulsion Place 1 drop into both eyes daily as needed.  3   spironolactone (ALDACTONE) 25 MG tablet TAKE 1 TABLET BY MOUTH DAILY  BEFORE SUPPER 100 tablet 3   TRULICITY 1.5 MG/0.5ML SOPN INJECT THE CONTENTS OF ONE PEN  SUBCUTANEOUSLY WEEKLY AS  DIRECTED 6 mL 3   No current facility-administered medications on file prior to visit.    Allergies  Allergen Reactions   Bee Venom Hives and Swelling   Propoxyphene Hcl Itching   Amlodipine Besylate Swelling   Hydrocodone Other (See Comments)    With Vicodin - makes patient "jittery", and "hangover effect - sleepy next day"   Lisinopril Cough    Changed to ARB   Metformin And Related Diarrhea    GI distress   Metronidazole Other (See Comments)     "just didn't work; my body never did heal from it"   Valsartan Other (See Comments)      "sleep more the next day after I took it; it made me real tired"   Sulfasalazine Other (See Comments)    Other reaction(s): Unknown   Xyzal [Levocetirizine Dihydrochloride] Rash   Social History   Occupational History   Occupation: unemployed    Associate Professor: UNEMPLOYED  Tobacco Use   Smoking status: Former    Packs/day: 0.50    Years: 0.50    Additional pack years: 0.00  Total pack years: 0.25    Types: Cigarettes    Quit date: 09/03/1975    Years since quitting: 47.4   Smokeless tobacco: Never   Tobacco comments:    no plans to start again   Vaping Use   Vaping Use: Never used  Substance and Sexual Activity   Alcohol use: No   Drug use: No   Sexual activity: Not Currently   Family History  Problem Relation Age of Onset   Diabetes Father    Heart disease Mother    Hypertension Sister    Hyperlipidemia Sister    Cancer Sister    Hypertension Brother    Hyperlipidemia Brother    Congestive Heart Failure Brother    Hypertension Sister    Colon cancer Neg Hx    Depression Neg Hx    Anxiety disorder Neg Hx    Immunization History  Administered Date(s) Administered   COVID-19, mRNA, vaccine(Comirnaty)12 years and older 09/05/2022   Fluad Quad(high Dose 65+) 05/30/2021, 06/05/2022   H1N1 08/11/2008   Influenza Split 05/17/2011, 05/27/2012   Influenza Whole 05/25/2007, 06/08/2008, 06/02/2009, 06/05/2010   Influenza,inj,Quad PF,6+ Mos 04/28/2013, 06/01/2014, 05/18/2015, 05/17/2016, 04/30/2017, 06/03/2018, 07/14/2019, 06/14/2020   PFIZER Comirnaty(Gray Top)Covid-19 Tri-Sucrose Vaccine 01/10/2021   PFIZER(Purple Top)SARS-COV-2 Vaccination 01/14/2020, 02/04/2020, 09/06/2020   Pfizer Covid-19 Vaccine Bivalent Booster 75yrs & up 08/01/2021   Pneumococcal Conjugate-13 10/20/2013   Pneumococcal Polysaccharide-23 06/02/1993, 06/17/2012, 03/19/2018   Td 10/31/1996, 03/02/2008     Review of Systems: Negative except as noted in the HPI.   Objective: Vitals:   02/04/23 1450  BP: (!) 110/55    [name] [name] is a pleasant 72 y.o. female in NAD. AAO X 3. Vascular Examination: Capillary refill time immediate b/l. Vascular status intact b/l with palpable pedal pulses. Pedal hair diminished b/l. Trace edema b/l. No pain with calf compression b/l. Skin temperature gradient WNL b/l. No cyanosis or clubbing b/l. No ischemia or gangrene noted b/l.   Neurological Examination: Sensation grossly intact b/l with 10 gram monofilament. Vibratory sensation intact b/l. Pt has subjective symptoms of neuropathy.  Dermatological  Examination: Pedal skin with normal turgor, texture and tone b/l.  No open wounds. No interdigital macerations.   Toenails 1-5 b/l thick, discolored, elongated with subungual debris and pain on dorsal palpation.   Hyperkeratotic lesion(s) bilateral heels.  No erythema, no edema, no drainage, no fluctuance.  Musculoskeletal Examination: Muscle strength 5/5 to all lower extremity muscle groups bilaterally. Hammertoes 2-5 b/l. No pain, crepitus or joint limitation noted with ROM bilateral LE. Pes planus deformity noted bilateral LE. Utilizes rollator for ambulation assistance.  Radiographs: None  Last A1c:      Latest Ref Rng & Units 01/08/2023   10:49 AM 09/05/2022    3:05 PM 06/05/2022    9:00 AM  Hemoglobin A1C  Hemoglobin-A1c 0.0 - 7.0 % 6.2  6.4  6.4    ADA Risk Categorization: Low Risk :  Patient has all of the following: Intact protective sensation No prior foot ulcer  No severe deformity Pedal pulses present  Assessment: 1. Pain due to onychomycosis of toenails of both feet   2. Callus   3. Pes planus of both feet   4. Acquired hammertoes of both feet   5. Diabetic peripheral neuropathy associated with type 2 diabetes mellitus (HCC)   6. Encounter for diabetic foot exam [facility])     Plan: -Patient was evaluated and treated. All patient's and/or POA's questions/concerns answered on today's visit. -Diabetic foot examination performed  today. -Continue diabetic foot care principles: inspect feet daily, monitor glucose as recommended by PCP and/or Endocrinologist, and follow prescribed diet per PCP, Endocrinologist and/or dietician. -Continue diabetic shoes daily. -Toenails 1-5 b/l were debrided in length and girth with sterile nail nippers and dremel without iatrogenic bleeding.  -Callus(es) bilateral heels pared utilizing rotary bur without complication or incident. Total number pared =2. -Patient/POA to call should there be question/concern in the interim. Return in about 3  months (around 05/07/2023).  Freddie Breech, DPM

## 2023-02-20 DIAGNOSIS — H4089 Other specified glaucoma: Secondary | ICD-10-CM | POA: Diagnosis not present

## 2023-02-20 DIAGNOSIS — E119 Type 2 diabetes mellitus without complications: Secondary | ICD-10-CM | POA: Diagnosis not present

## 2023-02-21 ENCOUNTER — Ambulatory Visit (INDEPENDENT_AMBULATORY_CARE_PROVIDER_SITE_OTHER): Payer: 59

## 2023-02-21 DIAGNOSIS — Z0000 Encounter for general adult medical examination without abnormal findings: Secondary | ICD-10-CM

## 2023-02-21 NOTE — Patient Instructions (Signed)

## 2023-02-21 NOTE — Progress Notes (Signed)
Subjective:   [name] [name] is a 72 y.o. female who presents for Medicare Annual (Subsequent) preventive examination.  Visit Complete: Virtual  I connected with  Reyne Dumas on 02/21/23 by a audio enabled telemedicine application and verified that I am speaking with the correct person using two identifiers.  Patient Location: Home  Provider Location: Home Office  I discussed the limitations of evaluation and management by telemedicine. The patient expressed understanding and agreed to proceed.   Review of Systems    Per HPI unless specifically indicated below.        Objective:    Today's Vitals   02/21/23 1415  PainSc: 6    There is no height or weight on file to calculate BMI.     01/08/2023   10:54 AM 09/05/2022    3:01 PM 06/05/2022    9:03 AM 01/23/2022    8:42 AM 12/19/2021   11:05 AM 04/18/2021   11:09 AM 03/23/2021    8:50 AM  Advanced Directives  Does Patient Have a Medical Advance Directive? No No No No No No No  Would patient like information on creating a medical advance directive? No - Patient declined No - Patient declined No - Patient declined No - Patient declined No - Patient declined No - Patient declined Yes (MAU/Ambulatory/Procedural Areas - Information given)    Current Medications (verified) Outpatient Encounter Medications as of 02/21/2023  Medication Sig   Accu-Chek FastClix Lancets MISC Use to check blood sugars 3x per day. E11.9   acyclovir (ZOVIRAX) 200 MG capsule Take one by mouth daily for prophylaxis   albuterol (PROVENTIL HFA) 108 (90 Base) MCG/ACT inhaler Inhale 2 puffs into the lungs every 4 (four) hours as needed for wheezing or shortness of breath.   Alcohol Swabs (B-D SINGLE USE SWABS REGULAR) PADS USE THREE TIMES DAILY   allopurinol (ZYLOPRIM) 300 MG tablet Take 1 tablet (300 mg total) by mouth daily.   ammonium lactate (LAC-HYDRIN) 12 % lotion APPLY 1 APPLICATION TOPICALLY TWICE DAILY AS NEEDED FOR DRY SKIN  (SUBSTITUTED FOR LAC HYDRIN)   aspirin EC 81 MG tablet Take 81 mg by mouth daily.   atorvastatin (LIPITOR) 40 MG tablet Take 1 tablet (40 mg total) by mouth daily.   B-D ULTRAFINE III SHORT PEN 31G X 8 MM MISC USE TO INJECT THREE TIMES A DAY   Blood Glucose Monitoring Suppl (ACCU-CHEK GUIDE) w/Device KIT USE TO CHECK BLOOD SUGAR 3 TIMES DAILY   Brimonidine Tartrate-Timolol (COMBIGAN OP) Combigan   budesonide-formoterol (SYMBICORT) 160-4.5 MCG/ACT inhaler Inhale 2 puffs into the lungs in the morning and at bedtime. with spacer and rinse mouth afterwards.   buPROPion (WELLBUTRIN) 100 MG tablet Take 1 tablet (100 mg total) by mouth 2 (two) times daily.   Cholecalciferol (HM VITAMIN D3) 100 MCG (4000 UT) CAPS Take 1 capsule (4,000 Units total) by mouth daily in the afternoon.   COMBIGAN 0.2-0.5 % ophthalmic solution INSTILL 1 DROP INTO THE  LEFT EYE EVERY 12 HOURS   DULoxetine (CYMBALTA) 60 MG capsule Take 1 capsule (60 mg total) by mouth daily.   EPINEPHrine 0.3 mg/0.3 mL IJ SOAJ injection Inject 0.3 mg into the muscle as needed for anaphylaxis.   esomeprazole (NEXIUM) 40 MG capsule Take 1 capsule (40 mg total) by mouth daily.   famotidine (PEPCID) 40 MG tablet Take 1 tablet (40 mg total) by mouth daily.   fluticasone (FLONASE) 50 MCG/ACT nasal spray Place 1 spray into both nostrils in the morning and  at bedtime.   furosemide (LASIX) 40 MG tablet Take one tablet by mouth daily PRN   glucose blood (ACCU-CHEK GUIDE) test strip USE TO CHECK BLOOD SUGAR 3 TIMES DAILY   HUMALOG KWIKPEN 100 UNIT/ML KwikPen INJECT SUBCUTANEOUSLY 35 UNITS  TWICE DAILY AFTER MEALS AS  DIRECTED (Patient taking differently: 20 Units daily.)   Insulin Syringe-Needle U-100 (BD INSULIN SYRINGE ULTRAFINE) 31G X 15/64" 0.5 ML MISC Use to inject three times a day   LANTUS SOLOSTAR 100 UNIT/ML Solostar Pen INJECT SUBCUTANEOUSLY 35 UNITS  TWICE DAILY (Patient taking differently: 35 Units daily. INJECT SUBCUTANEOUSLY 35  UNITS TWICE  DAILY)   latanoprost (XALATAN) 0.005 % ophthalmic solution Place 1 drop into both eyes at bedtime.   LINZESS 290 MCG CAPS capsule Take 1 capsule (290 mcg total) by mouth daily as needed.   metoCLOPramide (REGLAN) 5 MG tablet Take one by mouth twice a day as directed   metoprolol succinate (TOPROL-XL) 50 MG 24 hr tablet TAKE 1 TABLETS BY MOUTH DAILY  WITH OR IMMEDIATELY FOLLOWING A  MEAL   montelukast (SINGULAIR) 10 MG tablet Take 1 tablet (10 mg total) by mouth daily.   Multiple Vitamins-Minerals (MULTIVITAMIN PO) Take 1 tablet by mouth daily.   REPATHA SURECLICK 140 MG/ML SOAJ INJECT 140MG  SUBCUTANEOUSLY  EVERY 2 WEEKS   RESTASIS MULTIDOSE 0.05 % ophthalmic emulsion Place 1 drop into both eyes daily as needed.   spironolactone (ALDACTONE) 25 MG tablet TAKE 1 TABLET BY MOUTH DAILY  BEFORE SUPPER   TRULICITY 1.5 MG/0.5ML SOPN INJECT THE CONTENTS OF ONE PEN  SUBCUTANEOUSLY WEEKLY AS  DIRECTED   irbesartan (AVAPRO) 300 MG tablet Take 1 tablet (300 mg total) by mouth daily. (Patient not taking: Reported on 02/21/2023)   [DISCONTINUED] famciclovir (FAMVIR) 500 MG tablet Take 1 tablet (500 mg total) by mouth 3 (three) times daily. (Patient not taking: Reported on 02/21/2023)   No facility-administered encounter medications on file as of 02/21/2023.    Allergies (verified) Bee venom, Propoxyphene hcl, Amlodipine besylate, Hydrocodone, Lisinopril, Metformin and related, Metronidazole, Valsartan, Sulfasalazine, and Xyzal [levocetirizine dihydrochloride]   History: Past Medical History:  Diagnosis Date   Allergy    Angina    Anxiety    Arthritis    Asthma    Blood transfusion    Blood transfusion without reported diagnosis    Cataract    Chronic cough    Now followed by pulmonary   Chronic lower back pain    Complication of anesthesia    "just wake up coughing; that's all"   Concussion 11/18/11   "fell at dr's office; hit head"   COPD (chronic obstructive pulmonary disease) (HCC)    Delayed  gastric emptying    Depression    Diabetes mellitus type II    "take insulin & pills"   Dyslipidemia    Exertional dyspnea    GERD (gastroesophageal reflux disease)    Glaucoma    Gout    Headache(784.0) 11/18/11   "I've had mild headaches the last couple days"   Headache(784.0) 01/08/12   "pretty constant since 11/18/11's concussion"   Heart murmur    History of colonoscopy    HTN (hypertension)    Myocardial infarction (HCC)    Osteoporosis    Peripheral neuropathy    Pneumonia    "i've had it once" (11/18/11)   Sleep apnea    on CPAP 12   Syncope 06/16/2012   Vocal cord paralysis, unilateral partial    "right"   Past Surgical  History:  Procedure Laterality Date   ANTERIOR CERVICAL DECOMP/DISCECTOMY FUSION  01/15/2012   Procedure: ANTERIOR CERVICAL DECOMPRESSION/DISCECTOMY FUSION 3 LEVELS;  Surgeon: Tia Alert, MD;  Location: [facility];  Service: Neurosurgery;  Laterality: N/A;  Anterior Cervical Decompression Discectomy Fusion Cerivcal three four, cervical four five, cervical five six, cervical six seven   BREAST BIOPSY     bilaterally   BREAST CYST EXCISION     right twice; left once   BREAST REDUCTION SURGERY  04-21-2003   BRONCHOSCOPY  07-2007   CARDIAC CATHETERIZATION  03-02-2004   CARPAL TUNNEL RELEASE     left   CATARACT EXTRACTION  1955; 1979   bilateral; right eye   CESAREAN SECTION  1987   COLONOSCOPY W/ BIOPSIES  11/21/2010   adenoma polyp--no hi grade dysplasia [address]   EYE SURGERY     KNEE ARTHROSCOPY  08/2000   right   LEFT AND RIGHT HEART CATHETERIZATION WITH CORONARY ANGIOGRAM N/A 01/19/2014   Procedure: LEFT AND RIGHT HEART CATHETERIZATION WITH CORONARY ANGIOGRAM;  Surgeon: Lesleigh Noe, MD;  Location: [facility] CATH LAB;  Service: Cardiovascular;  Laterality: N/A;   REDUCTION MAMMAPLASTY Bilateral    SHOULDER ARTHROSCOPY  08/2000   right   SHOULDER ARTHROSCOPY W/ ROTATOR CUFF REPAIR  10/18/2003   left   SPINE SURGERY     T-score  09/02/04   -0.54  (low risk currently)   TOTAL ABDOMINAL HYSTERECTOMY  10-07-2000   TREATMENT FISTULA ANAL     TUBAL LIGATION  1987   Family History  Problem Relation Age of Onset   Diabetes Father    Heart disease Mother    Hypertension Sister    Hyperlipidemia Sister    Cancer Sister    Hypertension Brother    Hyperlipidemia Brother    Congestive Heart Failure Brother    Hypertension Sister    Colon cancer Neg Hx    Depression Neg Hx    Anxiety disorder Neg Hx    Social History   Socioeconomic History   Marital status: Single    Spouse name: Not on file   Number of children: 1   Years of education: 12   Highest education level: 12th grade  Occupational History   Occupation: unemployed    Associate Professor: UNEMPLOYED  Tobacco Use   Smoking status: Former    Packs/day: 0.50    Years: 0.50    Additional pack years: 0.00    Total pack years: 0.25    Types: Cigarettes    Quit date: 09/03/1975    Years since quitting: 47.5   Smokeless tobacco: Never   Tobacco comments:    no plans to start again  Vaping Use   Vaping Use: Never used  Substance and Sexual Activity   Alcohol use: No   Drug use: No   Sexual activity: Not Currently  Other Topics Concern   Not on file  Social History Narrative      Emergency Contact: sister, catherine Chanthavong [phone]      Who lives with you: Lives alone, in ground level apartment; has smoke alarms. No throw rugs on floor. Has grab bars at tub and toilet.   Any pets: none   Diet: Pt has a varied diet of protein, starch, and vegetables.   Exercise: Not currently exercising   Seatbelts: Pt reports wearing seatbelt when in vehicles.    Hobbies: tv, church and church activities, volunteer work  Social Determinants of Health   Financial Resource Strain: Low Risk  (02/21/2023)   Overall Financial Resource Strain (CARDIA)    Difficulty of Paying Living Expenses:  Not hard at all  Food Insecurity: No Food Insecurity (02/21/2023)   Hunger Vital Sign    Worried About Running Out of Food in the Last Year: Never true    Ran Out of Food in the Last Year: Never true  Transportation Needs: No Transportation Needs (02/21/2023)   PRAPARE - Administrator, Civil Service (Medical): No    Lack of Transportation (Non-Medical): No  Physical Activity: Inactive (02/21/2023)   Exercise Vital Sign    Days of Exercise per Week: 0 days    Minutes of Exercise per Session: 0 min  Stress: No Stress Concern Present (02/21/2023)   [employer] of Occupational Health - Occupational Stress Questionnaire    Feeling of Stress : Not at all  Social Connections: Moderately Integrated (02/21/2023)   Social Connection and Isolation Panel [NHANES]    Frequency of Communication with Friends and Family: More than three times a week    Frequency of Social Gatherings with Friends and Family: Twice a week    Attends Religious Services: More than 4 times per year    Active Member of [employer] or Organizations: Yes    Attends Banker Meetings: Never    Marital Status: Never married    Tobacco Counseling Counseling given: No Tobacco comments: no plans to start again   Clinical Intake:  Pre-visit preparation completed: No  Pain : 0-10 Pain Score: 6  Pain Type: Chronic pain Pain Location: Leg Pain Orientation: Right Pain Descriptors / Indicators: Burning Pain Onset: More than a month ago Pain Frequency: Constant     Nutritional Status: BMI of 19-24  Normal Nutritional Risks: None Diabetes: Yes CBG done?: No Did pt. bring in CBG monitor from home?: No  How often do you need to have someone help you when you read instructions, pamphlets, or other written materials from your doctor or pharmacy?: 1 - Never  Interpreter Needed?: No  Information entered by :: Laurel Dimmer, CMA   Activities of Daily Living    02/21/2023    2:12 PM  In your  present state of health, do you have any difficulty performing the following activities:  Hearing? 1  Comment Hearing Aid  Vision? 1  Difficulty concentrating or making decisions? 0  Walking or climbing stairs? 1  Dressing or bathing? 0  Doing errands, shopping? 1    Patient Care Team: Nestor Ramp, MD as PCP - Vita Erm, MD as PCP - Cardiology (Cardiology) Charna Elizabeth, MD as Consulting Physician (Gastroenterology) Pricilla Riffle, MD as Consulting Physician (Cardiology) Linna Darner, RD as Dietitian (Family Medicine) Waymon Budge, MD as Consulting Physician (Pulmonary Disease) Felecia Shelling, DPM as Consulting Physician (Podiatry) Danella Maiers, DMD (Dentistry) Kathrin Ruddy, RPH-CPP (Pharmacist) Ellamae Sia, DO as Consulting Physician (Allergy) Oletta Darter, MD (Psychiatry) Santiago Glad, MD as Referring Physician (Specialist) Sinda Du, MD as Consulting Physician (Ophthalmology)  Indicate any recent Medical Services you may have received from other than [facility] providers in the past year (date may be approximate).     Assessment:   This is a routine wellness examination for [facility]  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 Wear glasses. Annual Eye Exam.   Dietary issues and exercise activities discussed: Current Exercise Habits: The patient does not  participate in regular exercise at present, Exercise limited by: respiratory conditions(s);Other - see comments (legally blind)   Goals Addressed   None    Depression Screen    02/21/2023    2:09 PM 01/08/2023   10:54 AM 06/05/2022    9:08 AM 05/16/2022    4:12 PM 01/03/2022    4:17 PM 12/19/2021   11:05 AM 11/08/2021   11:06 AM  PHQ 2/9 Scores  PHQ - 2 Score 0 0 0   0   PHQ- 9 Score 3 4 0   0   Exception Documentation       Other- indicate reason in comment box  Not completed       currently in treatment with psychoiatry and therapist      Information is confidential and restricted. Go to Review Flowsheets to unlock data.    Fall Risk    02/21/2023    2:12 PM 01/08/2023   10:54 AM 09/05/2022    3:01 PM 06/05/2022    9:03 AM 01/23/2022    8:42 AM  Fall Risk   Falls in the past year? 1 0 1 1 1   Number falls in past yr: 0  1 0 0  Injury with Fall? 0    0  Risk for fall due to : No Fall Risks      Follow up Falls evaluation completed    Falls evaluation completed    MEDICARE RISK AT HOME: Pt uses shower chair, elevated toilet seat and walker/cane. Report home free of tripping hazards. Adequate light throughout home.   TIMED UP AND GO:  Was the test performed?  No    Cognitive Function:    09/16/2018   10:25 AM 05/27/2012    2:00 PM  MMSE - Mini Mental State Exam  Orientation to time 5 5  Orientation to Place 5 5  Registration 3 3  Attention/ Calculation 5 5  Recall 3 3  Language- name 2 objects 2 2  Language- repeat 1 1  Language- follow 3 step command 3 3  Language- read & follow direction 1 1  Write a sentence 1 1  Copy design 1 1  Total score 30 30        02/21/2023    2:13 PM 03/23/2021    8:54 AM 09/16/2018   10:26 AM  6CIT Screen  What Year? 0 points 0 points 0 points  What month? 0 points 0 points 0 points  What time? 0 points 0 points 0 points  Count back from 20 0 points 0 points 0 points  Months in reverse 4 points 0 points 0 points  Repeat phrase 0 points 0 points 0 points  Total Score 4 points 0 points 0 points    Immunizations Immunization History  Administered Date(s) Administered   COVID-19, mRNA, vaccine(Comirnaty)12 years and older 09/05/2022   Fluad Quad(high Dose 65+) 05/30/2021, 06/05/2022   H1N1 08/11/2008   Influenza Split 05/17/2011, 05/27/2012   Influenza Whole 05/25/2007, 06/08/2008, 06/02/2009, 06/05/2010   Influenza,inj,Quad PF,6+ Mos 04/28/2013, 06/01/2014, 05/18/2015, 05/17/2016, 04/30/2017, 06/03/2018, 07/14/2019, 06/14/2020   PFIZER Comirnaty(Gray Top)Covid-19  Tri-Sucrose Vaccine 01/10/2021   PFIZER(Purple Top)SARS-COV-2 Vaccination 01/14/2020, 02/04/2020, 09/06/2020   Pfizer Covid-19 Vaccine Bivalent Booster 73yrs & up 08/01/2021   Pneumococcal Conjugate-13 10/20/2013   Pneumococcal Polysaccharide-23 06/02/1993, 06/17/2012, 03/19/2018   Td 10/31/1996, 03/02/2008    TDAP status: Due, Education has been provided regarding the importance of this vaccine. Advised may receive this vaccine at local pharmacy or Health Dept.  Aware to provide a copy of the vaccination record if obtained from local pharmacy or Health Dept. Verbalized acceptance and understanding.  Flu Vaccine status: Up to date  Pneumococcal vaccine status: Up to date  Covid-19 vaccine status: Information provided on how to obtain vaccines.   Qualifies for Shingles Vaccine? Yes   Zostavax completed No   Shingrix Completed?: No.    Education has been provided regarding the importance of this vaccine. Patient has been advised to call insurance company to determine out of pocket expense if they have not yet received this vaccine. Advised may also receive vaccine at local pharmacy or Health Dept. Verbalized acceptance and understanding.  Screening Tests Health Maintenance  Topic Date Due   Diabetic kidney evaluation - Urine ACR  Never done   Zoster Vaccines- Shingrix (1 of 2) Never done   DTaP/Tdap/Td (3 - Tdap) 03/02/2018   COVID-19 Vaccine (7 - 2023-24 season) 10/31/2022   OPHTHALMOLOGY EXAM  01/31/2023   INFLUENZA VACCINE  04/03/2023   HEMOGLOBIN A1C  07/11/2023   Diabetic kidney evaluation - eGFR measurement  12/12/2023   FOOT EXAM  02/04/2024   Medicare Annual Wellness (AWV)  02/21/2024   MAMMOGRAM  01/29/2025   Colonoscopy  03/15/2027   Pneumonia Vaccine 64+ Years old  Completed   DEXA SCAN  Completed   Hepatitis C Screening  Completed   HPV VACCINES  Aged Out    Health Maintenance  Health Maintenance Due  Topic Date Due   Diabetic kidney evaluation - Urine ACR  Never  done   Zoster Vaccines- Shingrix (1 of 2) Never done   DTaP/Tdap/Td (3 - Tdap) 03/02/2018   COVID-19 Vaccine (7 - 2023-24 season) 10/31/2022   OPHTHALMOLOGY EXAM  01/31/2023    Colorectal cancer screening: Type of screening: Colonoscopy. Completed 03/14/2017. Repeat every 10 years  Mammogram status: Completed 01/30/2023. Repeat every year  DEXA Scan: 08/06/2016  Lung Cancer Screening: (Low Dose CT Chest recommended if Age 42-80 years, 20 pack-year currently smoking OR have quit w/in 15years.) does not qualify.   Lung Cancer Screening Referral: not applicable  Additional Screening:  Hepatitis C Screening: does qualify; Completed 06/11/2017  Vision Screening: Recommended annual ophthalmology exams for early detection of glaucoma and other disorders of the eye. Is the patient up to date with their annual eye exam?  Yes  Who is the provider or what is the name of the office in which the patient attends annual eye exams? Dr. Cathey Endow , [place]  If pt is not established with a provider, would they like to be referred to a provider to establish care? No .   Dental Screening: Recommended annual dental exams for proper oral hygiene  Diabetic Foot Exam: Diabetic Foot Exam: Completed 02/04/2023  Community Resource Referral / Chronic Care Management: CRR required this visit?  No   CCM required this visit?  No     Plan:     I have personally reviewed and noted the following in the patient's chart:   Medical and social history Use of alcohol, tobacco or illicit drugs  Current medications and supplements including opioid prescriptions. Patient is not currently taking opioid prescriptions. Functional ability and status Nutritional status Physical activity Advanced directives List of other physicians Hospitalizations, surgeries, and ER visits in previous 12 months Vitals Screenings to include cognitive, depression, and falls Referrals and appointments  In addition, I have reviewed  and discussed with patient certain preventive protocols, quality metrics, and best practice recommendations. A written personalized care plan for  preventive services as well as general preventive health recommendations were provided to patient.     Lonna Cobb, CMA   02/21/2023   After Visit Summary: (MyChart) Due to this being a telephonic visit, the after visit summary with patients personalized plan was offered to patient via MyChart   Nurse Notes: Approximately 30 minute Non-Face -To-Face Medicare Wellness Visit

## 2023-03-05 ENCOUNTER — Telehealth (HOSPITAL_COMMUNITY): Payer: Self-pay

## 2023-03-05 NOTE — Telephone Encounter (Signed)
Patient is calling for a refill on Wellbutrin. Patient was last seen in September of 2023 and was given an order for 3 months with a refill. 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 I told patient that it was unlikely that she will receive a refill as she has canceled or no showed her last 3 appointments but that I would reach out to you. Patient verbalized her understanding and okay with that. Please review and advise, thank you

## 2023-03-05 NOTE — Telephone Encounter (Signed)
She had missed multiple appointments, she can be seen in our surgery clinic for the refills.

## 2023-03-14 ENCOUNTER — Other Ambulatory Visit (HOSPITAL_COMMUNITY): Payer: Self-pay | Admitting: Psychiatry

## 2023-03-14 ENCOUNTER — Other Ambulatory Visit: Payer: Self-pay | Admitting: Allergy

## 2023-03-14 DIAGNOSIS — F330 Major depressive disorder, recurrent, mild: Secondary | ICD-10-CM

## 2023-03-14 DIAGNOSIS — F411 Generalized anxiety disorder: Secondary | ICD-10-CM

## 2023-03-29 ENCOUNTER — Other Ambulatory Visit: Payer: Self-pay | Admitting: Family Medicine

## 2023-04-09 ENCOUNTER — Ambulatory Visit: Payer: 59 | Admitting: Family Medicine

## 2023-04-12 ENCOUNTER — Other Ambulatory Visit: Payer: Self-pay | Admitting: Internal Medicine

## 2023-04-18 ENCOUNTER — Other Ambulatory Visit: Payer: Self-pay | Admitting: Family Medicine

## 2023-04-30 ENCOUNTER — Encounter: Payer: Self-pay | Admitting: Family Medicine

## 2023-04-30 ENCOUNTER — Ambulatory Visit (INDEPENDENT_AMBULATORY_CARE_PROVIDER_SITE_OTHER): Payer: 59 | Admitting: Family Medicine

## 2023-04-30 VITALS — BP 126/74 | HR 70 | Wt 173.0 lb

## 2023-04-30 DIAGNOSIS — E1149 Type 2 diabetes mellitus with other diabetic neurological complication: Secondary | ICD-10-CM | POA: Diagnosis not present

## 2023-04-30 DIAGNOSIS — Z13820 Encounter for screening for osteoporosis: Secondary | ICD-10-CM

## 2023-04-30 DIAGNOSIS — E785 Hyperlipidemia, unspecified: Secondary | ICD-10-CM

## 2023-04-30 DIAGNOSIS — I10 Essential (primary) hypertension: Secondary | ICD-10-CM

## 2023-04-30 LAB — POCT GLYCOSYLATED HEMOGLOBIN (HGB A1C): HbA1c, POC (controlled diabetic range): 6.4 % (ref 0.0–7.0)

## 2023-04-30 MED ORDER — LINZESS 290 MCG PO CAPS: 290.0000 ug | ORAL_CAPSULE | Freq: Every day | ORAL | 3 refills | Status: DC | PRN

## 2023-04-30 MED ORDER — IRBESARTAN 300 MG PO TABS: 300.0000 mg | ORAL_TABLET | Freq: Every day | ORAL | Status: DC

## 2023-04-30 NOTE — Patient Instructions (Signed)
If you have trouble getting the bone density scan set up let me know.  I will plan on seeing you back in 3-4 months!

## 2023-05-01 NOTE — Progress Notes (Signed)
    CHIEF COMPLAINT / HPI: Here for follow-up diabetes mellitus: She continues to take her medicines on her own regimen.  No episodes of low blood sugar.  Does need some refills 2.  Follow up hypertension: Taking medicines regularly.  No problems with headache or chest pain.  No lower extremity edema 3.  Chronic constipation: Much better on Linzess.  Needs a refill on that. 4.  Wants to get bone density checked   PERTINENT  PMH / PSH: I have reviewed the patient's medications, allergies, past medical and surgical history, smoking status and updated in the EMR as appropriate.   OBJECTIVE:  BP 126/74   Pulse 70   Wt 173 lb (78.5 kg)   SpO2 98%   BMI 32.69 kg/m  GENERAL: Well-developed female no acute distress.  Walks with rolling walker. HEENT: Resting horizontal nystagmus.  Bilaterally legally blind. CV: Regular rate and rhythm LUNGS: Clear to auscultation bilaterally  ASSESSMENT / PLAN:   HYPERTENSION, BENIGN SYSTEMIC Excellent control.  Will continue current medication regimen.  Follow-up 3 months.  Diabetes mellitus type 2 with neurological manifestations (HCC) She tends to follow her own medication regimen but it seems to work so we will make no medication changes.  Follow-up 3 months.  Hyperlipidemia Doing well on her current medications including Repatha.  She is not having problems with that.  Refill atorvastatin.   Denny Levy MD

## 2023-05-01 NOTE — Assessment & Plan Note (Signed)
Excellent control.  Will continue current medication regimen.  Follow-up 3 months.

## 2023-05-01 NOTE — Assessment & Plan Note (Signed)
Doing well on her current medications including Repatha.  She is not having problems with that.  Refill atorvastatin.

## 2023-05-01 NOTE — Assessment & Plan Note (Signed)
She tends to follow her own medication regimen but it seems to work so we will make no medication changes.  Follow-up 3 months.

## 2023-05-06 ENCOUNTER — Other Ambulatory Visit: Payer: Self-pay | Admitting: Family Medicine

## 2023-05-06 ENCOUNTER — Other Ambulatory Visit: Payer: Self-pay | Admitting: Allergy

## 2023-05-06 DIAGNOSIS — F411 Generalized anxiety disorder: Secondary | ICD-10-CM

## 2023-05-06 DIAGNOSIS — F330 Major depressive disorder, recurrent, mild: Secondary | ICD-10-CM

## 2023-05-22 ENCOUNTER — Ambulatory Visit (HOSPITAL_COMMUNITY): Payer: 59 | Admitting: Psychiatry

## 2023-05-28 ENCOUNTER — Ambulatory Visit: Payer: 59 | Admitting: Podiatry

## 2023-06-09 ENCOUNTER — Other Ambulatory Visit: Payer: Self-pay | Admitting: Family Medicine

## 2023-06-09 DIAGNOSIS — E1149 Type 2 diabetes mellitus with other diabetic neurological complication: Secondary | ICD-10-CM

## 2023-06-11 ENCOUNTER — Ambulatory Visit (INDEPENDENT_AMBULATORY_CARE_PROVIDER_SITE_OTHER): Payer: 59 | Admitting: Podiatry

## 2023-06-11 ENCOUNTER — Encounter: Payer: Self-pay | Admitting: Podiatry

## 2023-06-11 DIAGNOSIS — M79675 Pain in left toe(s): Secondary | ICD-10-CM | POA: Diagnosis not present

## 2023-06-11 DIAGNOSIS — E1142 Type 2 diabetes mellitus with diabetic polyneuropathy: Secondary | ICD-10-CM

## 2023-06-11 DIAGNOSIS — M79674 Pain in right toe(s): Secondary | ICD-10-CM

## 2023-06-11 DIAGNOSIS — L84 Corns and callosities: Secondary | ICD-10-CM

## 2023-06-11 DIAGNOSIS — B351 Tinea unguium: Secondary | ICD-10-CM

## 2023-06-11 NOTE — Progress Notes (Signed)
ANNUAL DIABETIC FOOT EXAM  Subjective: [name] [name] presents today annual diabetic foot exam. Chief Complaint  Patient presents with   Nail Problem    Rfc.   Patient confirms h/o diabetes.  Patient denies any h/o foot wounds.  Patient has been diagnosed with neuropathy.  Risk factors: diabetes, diabetic neuropathy, history of gout, h/o MI, HTN, COPD, hyperlipidemia, h/o tobacco use in remission.  Nestor Ramp, MD is patient's PCP.  Past Medical History:  Diagnosis Date   Allergy    Angina    Anxiety    Arthritis    Asthma    Blood transfusion    Blood transfusion without reported diagnosis    Cataract    Chronic cough    Now followed by pulmonary   Chronic lower back pain    Complication of anesthesia    "just wake up coughing; that's all"   Concussion 11/18/11   "fell at dr's office; hit head"   COPD (chronic obstructive pulmonary disease) (HCC)    Delayed gastric emptying    Depression    Diabetes mellitus type II    "take insulin & pills"   Dyslipidemia    Exertional dyspnea    GERD (gastroesophageal reflux disease)    Glaucoma    Gout    Headache(784.0) 11/18/11   "I've had mild headaches the last couple days"   Headache(784.0) 01/08/12   "pretty constant since 11/18/11's concussion"   Heart murmur    History of colonoscopy    HTN (hypertension)    Myocardial infarction (HCC)    Osteoporosis    Peripheral neuropathy    Pneumonia    "i've had it once" (11/18/11)   Sleep apnea    on CPAP 12   Syncope 06/16/2012   Vocal cord paralysis, unilateral partial    "right"   Patient Active Problem List   Diagnosis Date Noted   Fatty liver 12/12/2021   History of colonic polyps 12/12/2021   Healthcare maintenance 11/08/2021   Chronic idiopathic constipation 09/10/2021   Diverticular disease of colon 09/10/2021   Hair loss 01/10/2021   Moderate persistent asthma without complication 03/10/2019   Seasonal and perennial allergic rhinitis 09/09/2018    LPRD (laryngopharyngeal reflux disease) 09/09/2018   Delayed gastric emptying 01/23/2017   Hoarseness of voice 01/16/2017   Subjective tinnitus of both ears 01/16/2017   History of wrist fracture, left 03/06/2016   Bee sting allergy 06/30/2014   Chronic headaches 12/27/2013   Chronic flank pain 10/07/2012   Fusion of spine of cervical region 06/03/2012   Acquired cyst of kidney 05/28/2012   Incomplete bladder emptying 05/28/2012   Legally BLIND, bilateral 02/05/2012   Nail dystrophy 11/15/2011   Hip arthritis, bilateral hip pain 05/29/2011   DJD (degenerative joint disease) of knee 05/01/2011   Allergic rhinitis due to pollen 01/24/2011   Neuropathy 11/08/2010   GAIT IMBALANCE 05/30/2010   CARPAL TUNNEL SYNDROME, BILATERAL 12/28/2009   Hyperlipidemia 05/01/2009   HERPES SIMPLEX INFECTION, RECURRENT 09/14/2008   NEURAL HEARING LOSS BILATERAL 03/02/2008   Cataract extraction status 09/10/2007   Unilateral complete paralysis of vocal cord 02/17/2007   GERD 12/12/2006   Diabetes mellitus type 2 with neurological manifestations (HCC) 10/30/2006   Major depressive disorder, recurrent episode (HCC) 10/30/2006   Obstructive sleep apnea 10/30/2006   Glaucoma 10/30/2006   HYPERTENSION, BENIGN SYSTEMIC 10/30/2006   Past Surgical History:  Procedure Laterality Date   ANTERIOR CERVICAL DECOMP/DISCECTOMY FUSION  01/15/2012   Procedure: ANTERIOR CERVICAL DECOMPRESSION/DISCECTOMY FUSION 3 LEVELS;  Surgeon: Tia Alert, MD;  Location: [facility] NEURO ORS;  Service: Neurosurgery;  Laterality: N/A;  Anterior Cervical Decompression Discectomy Fusion Cerivcal three four, cervical four five, cervical five six, cervical six seven   BREAST BIOPSY     bilaterally   BREAST CYST EXCISION     right twice; left once   BREAST REDUCTION SURGERY  04-21-2003   BRONCHOSCOPY  07-2007   CARDIAC CATHETERIZATION  03-02-2004   CARPAL TUNNEL RELEASE     left   CATARACT EXTRACTION  1955; 1979   bilateral; right eye    CESAREAN SECTION  1987   COLONOSCOPY W/ BIOPSIES  11/21/2010   adenoma polyp--no hi grade dysplasia [address]   EYE SURGERY     KNEE ARTHROSCOPY  08/2000   right   LEFT AND RIGHT HEART CATHETERIZATION WITH CORONARY ANGIOGRAM N/A 01/19/2014   Procedure: LEFT AND RIGHT HEART CATHETERIZATION WITH CORONARY ANGIOGRAM;  Surgeon: Lesleigh Noe, MD;  Location: [facility] CATH LAB;  Service: Cardiovascular;  Laterality: N/A;   REDUCTION MAMMAPLASTY Bilateral    SHOULDER ARTHROSCOPY  08/2000   right   SHOULDER ARTHROSCOPY W/ ROTATOR CUFF REPAIR  10/18/2003   left   SPINE SURGERY     T-score  09/02/04   -0.54 (low risk currently)   TOTAL ABDOMINAL HYSTERECTOMY  10-07-2000   TREATMENT FISTULA ANAL     TUBAL LIGATION  1987   Current Outpatient Medications on File Prior to Visit  Medication Sig Dispense Refill   EPINEPHRINE 0.3 mg/0.3 mL IJ SOAJ injection INJECT INTRAMUSCULARLY 1 PEN AS  NEEDED FOR ALLERGIC RESPONSE AS  DIRECTED BY MD. Assunta Found MEDICAL  HELP AFTER USE. 2 each 1   Accu-Chek FastClix Lancets MISC Use to check blood sugars 3x per day. E11.9 306 each 12   ACCU-CHEK GUIDE test strip USE TO CHECK BLOOD SUGAR 3 TIMES DAILY 300 strip 2   acyclovir (ZOVIRAX) 200 MG capsule Take one by mouth daily for prophylaxis 90 capsule 3   albuterol (VENTOLIN HFA) 108 (90 Base) MCG/ACT inhaler USE 2 INHALATIONS BY MOUTH EVERY 4 HOURS AS NEEDED FOR WHEEZING  OR SHORTNESS OF BREATH 6.7 g 20   Alcohol Swabs (B-D SINGLE USE SWABS REGULAR) PADS USE THREE TIMES DAILY 300 each 3   allopurinol (ZYLOPRIM) 300 MG tablet Take 1 tablet (300 mg total) by mouth daily. 100 tablet 2   ammonium lactate (LAC-HYDRIN) 12 % lotion APPLY 1 APPLICATION TOPICALLY TWICE DAILY AS NEEDED FOR DRY SKIN (SUBSTITUTED FOR LAC HYDRIN) 400 g 12   aspirin EC 81 MG tablet Take 81 mg by mouth daily.     atorvastatin (LIPITOR) 40 MG tablet Take 1 tablet (40 mg total) by mouth daily. 100 tablet 2   B-D ULTRAFINE III SHORT PEN 31G X 8 MM MISC USE TO INJECT  THREE TIMES A DAY 90 each 3   Blood Glucose Monitoring Suppl (ACCU-CHEK GUIDE) w/Device KIT USE TO CHECK BLOOD SUGAR 3 TIMES DAILY 1 kit 0   Brimonidine Tartrate-Timolol (COMBIGAN OP) Combigan     budesonide-formoterol (SYMBICORT) 160-4.5 MCG/ACT inhaler Inhale 2 puffs into the lungs in the morning and at bedtime. with spacer and rinse mouth afterwards. 30.6 g 3   buPROPion (WELLBUTRIN) 100 MG tablet Take 1 tablet (100 mg total) by mouth 2 (two) times daily. 180 tablet 1   Cholecalciferol (HM VITAMIN D3) 100 MCG (4000 UT) CAPS Take 1 capsule (4,000 Units total) by mouth daily in the afternoon. 100 capsule 3   COMBIGAN 0.2-0.5 %  ophthalmic solution INSTILL 1 DROP INTO THE  LEFT EYE EVERY 12 HOURS 5 mL 12   DULoxetine (CYMBALTA) 60 MG capsule TAKE 1 CAPSULE BY MOUTH DAILY 90 capsule 3   esomeprazole (NEXIUM) 40 MG capsule Take 1 capsule (40 mg total) by mouth daily. 90 capsule 3   famotidine (PEPCID) 40 MG tablet Take 1 tablet (40 mg total) by mouth daily. 90 tablet 3   fluticasone (FLONASE) 50 MCG/ACT nasal spray Place 1 spray into both nostrils in the morning and at bedtime. 48 g 3   furosemide (LASIX) 40 MG tablet Take one tablet by mouth daily PRN 90 tablet 3   HUMALOG KWIKPEN 100 UNIT/ML KwikPen INJECT SUBCUTANEOUSLY 35 UNITS  TWICE DAILY AFTER MEALS AS  DIRECTED (Patient taking differently: 20 Units daily.) 75 mL 2   Insulin Syringe-Needle U-100 (BD INSULIN SYRINGE ULTRAFINE) 31G X 15/64" 0.5 ML MISC Use to inject three times a day 300 each 2   irbesartan (AVAPRO) 300 MG tablet Take 1 tablet (300 mg total) by mouth daily.     LANTUS SOLOSTAR 100 UNIT/ML Solostar Pen INJECT SUBCUTANEOUSLY 35 UNITS  TWICE DAILY (Patient taking differently: 35 Units daily. INJECT SUBCUTANEOUSLY 35  UNITS TWICE DAILY) 75 mL 2   latanoprost (XALATAN) 0.005 % ophthalmic solution Place 1 drop into both eyes at bedtime. 2.5 mL 3   LINZESS 290 MCG CAPS capsule Take 1 capsule (290 mcg total) by mouth daily as needed. 100  capsule 3   metoCLOPramide (REGLAN) 5 MG tablet Take one by mouth twice a day as directed 180 tablet 3   metoprolol succinate (TOPROL-XL) 50 MG 24 hr tablet TAKE 3 TABLETS BY MOUTH DAILY  WITH OR IMMEDIATELY FOLLOWING A  MEAL 300 tablet 2   montelukast (SINGULAIR) 10 MG tablet Take 1 tablet (10 mg total) by mouth daily. 100 tablet 3   Multiple Vitamins-Minerals (MULTIVITAMIN PO) Take 1 tablet by mouth daily.     REPATHA SURECLICK 140 MG/ML SOAJ INJECT 140MG  SUBCUTANEOUSLY  EVERY 2 WEEKS 6 mL 3   RESTASIS MULTIDOSE 0.05 % ophthalmic emulsion Place 1 drop into both eyes daily as needed.  3   spironolactone (ALDACTONE) 25 MG tablet TAKE 1 TABLET BY MOUTH DAILY  BEFORE SUPPER 100 tablet 2   TRULICITY 1.5 MG/0.5ML SOPN INJECT SUBCUTANEOUSLY THE  CONTENTS OF ONE PEN WEEKLY AS  DIRECTED 6 mL 3   No current facility-administered medications on file prior to visit.    Allergies  Allergen Reactions   Bee Venom Hives and Swelling   Propoxyphene Hcl Itching   Amlodipine Besylate Swelling   Hydrocodone Other (See Comments)    With Vicodin - makes patient "jittery", and "hangover effect - sleepy next day"   Lisinopril Cough    Changed to ARB   Metformin And Related Diarrhea    GI distress   Metronidazole Other (See Comments)     "just didn't work; my body never did heal from it"   Valsartan Other (See Comments)      "sleep more the next day after I took it; it made me real tired"   Sulfasalazine Other (See Comments)    Other reaction(s): Unknown   Xyzal [Levocetirizine Dihydrochloride] Rash   Social History   Occupational History   Occupation: unemployed    Associate Professor: UNEMPLOYED  Tobacco Use   Smoking status: Former    Current packs/day: 0.00    Average packs/day: 0.5 packs/day for 0.5 years (0.2 ttl pk-yrs)    Types: Cigarettes  Start date: 03/05/1975    Quit date: 09/03/1975    Years since quitting: 47.8    Passive exposure: Past   Smokeless tobacco: Never   Tobacco comments:    no  plans to start again  Vaping Use   Vaping status: Never Used  Substance and Sexual Activity   Alcohol use: No   Drug use: No   Sexual activity: Not Currently   Family History  Problem Relation Age of Onset   Diabetes Father    Heart disease Mother    Hypertension Sister    Hyperlipidemia Sister    Cancer Sister    Hypertension Brother    Hyperlipidemia Brother    Congestive Heart Failure Brother    Hypertension Sister    Colon cancer Neg Hx    Depression Neg Hx    Anxiety disorder Neg Hx    Immunization History  Administered Date(s) Administered   Fluad Quad(high Dose 65+) 05/30/2021, 06/05/2022   H1N1 08/11/2008   Influenza Split 05/17/2011, 05/27/2012   Influenza Whole 05/25/2007, 06/08/2008, 06/02/2009, 06/05/2010   Influenza,inj,Quad PF,6+ Mos 04/28/2013, 06/01/2014, 05/18/2015, 05/17/2016, 04/30/2017, 06/03/2018, 07/14/2019, 06/14/2020   PFIZER Comirnaty(Gray Top)Covid-19 Tri-Sucrose Vaccine 01/10/2021   PFIZER(Purple Top)SARS-COV-2 Vaccination 01/14/2020, 02/04/2020, 09/06/2020   Pfizer Covid-19 Vaccine Bivalent Booster 21yrs & up 08/01/2021   Pfizer(Comirnaty)Fall Seasonal Vaccine 12 years and older 09/05/2022   Pneumococcal Conjugate-13 10/20/2013   Pneumococcal Polysaccharide-23 06/02/1993, 06/17/2012, 03/19/2018   Td 10/31/1996, 03/02/2008     Review of Systems: Negative except as noted in the HPI.   Objective: There were no vitals filed for this visit.   [name] [name] is a pleasant 72 y.o. female in NAD. AAO X 3. Vascular Examination: Capillary refill time immediate b/l. Vascular status intact b/l with palpable pedal pulses. Pedal hair diminished b/l. Trace edema b/l. No pain with calf compression b/l. Skin temperature gradient WNL b/l. No cyanosis or clubbing b/l. No ischemia or gangrene noted b/l.   Neurological Examination: Sensation grossly intact b/l with 10 gram monofilament. Vibratory sensation intact b/l. Pt has subjective symptoms of  neuropathy.  Dermatological Examination: Pedal skin with normal turgor, texture and tone b/l.  No open wounds. No interdigital macerations.   Toenails 1-5 b/l thick, discolored, elongated with subungual debris and pain on dorsal palpation.   Hyperkeratotic lesion(s) bilateral heels.  No erythema, no edema, no drainage, no fluctuance.  Musculoskeletal Examination: Muscle strength 5/5 to all lower extremity muscle groups bilaterally. Hammertoes 2-5 b/l. No pain, crepitus or joint limitation noted with ROM bilateral LE. Pes planus deformity noted bilateral LE. Utilizes rollator for ambulation assistance.  Radiographs: None  Last A1c:      Latest Ref Rng & Units 04/30/2023   11:20 AM 01/08/2023   10:49 AM 09/05/2022    3:05 PM  Hemoglobin A1C  Hemoglobin-A1c 0.0 - 7.0 % 6.4  6.2  6.4    ADA Risk Categorization: Low Risk :  Patient has all of the following: Intact protective sensation No prior foot ulcer  No severe deformity Pedal pulses present  Assessment: 1. Pain due to onychomycosis of toenails of both feet   2. Diabetic peripheral neuropathy associated with type 2 diabetes mellitus (HCC)     Plan: -Patient was evaluated and treated. All patient's and/or POA's questions/concerns answered on today's visit. -Diabetic foot examination performed today. -Continue diabetic foot care principles: inspect feet daily, monitor glucose as recommended by PCP and/or Endocrinologist, and follow prescribed diet per PCP, Endocrinologist and/or dietician. -Continue diabetic shoes daily. -Toenails  1-5 b/l were debrided in length and girth with sterile nail nippers and dremel without iatrogenic bleeding.  -Callus(es) bilateral heels pared utilizing rotary bur without complication or incident. Total number pared =2. -Patient/POA to call should there be question/concern in the interim. No follow-ups on file.  Louann Sjogren, DPM

## 2023-06-17 DIAGNOSIS — G4733 Obstructive sleep apnea (adult) (pediatric): Secondary | ICD-10-CM | POA: Diagnosis not present

## 2023-06-24 NOTE — Progress Notes (Unsigned)
Follow Up Note  RE: [name] [name] MRN: [id number] DOB: 01/02/1951 Date of Office Visit: 06/25/2023  Referring provider: Nestor Ramp, MD Primary care provider: Nestor Ramp, MD  Chief Complaint: No chief complaint on file.  History of Present Illness: I had the pleasure of seeing [name] [name] for a follow up visit at the [facility] on 06/24/2023. She is a 72 y.o. female, who is being followed for asthma, allergic rhinitis,  LPRD and bee sting allergy. Her previous allergy office visit was on 12/25/2022 with Dr. Selena Batten. Today is a regular follow up visit.  Discussed the use of AI scribe software for clinical note transcription with the patient, who gave verbal consent to proceed.  History of Present Illness            Moderate persistent asthma without complication Some DOE. No prednisone since last visit.  Today's spirometry was unremarkable. Daily controller medication(s): continue Symbicort 2 puffs twice a day with spacer and rinse mouth afterwards. Continue Singulair 10mg  daily  May use albuterol rescue inhaler 2 puffs every 4 to 6 hours as needed for shortness of breath, chest tightness, coughing, and wheezing. May use albuterol rescue inhaler 2 puffs 5 to 15 minutes prior to strenuous physical activities. Monitor frequency of use.  Get spirometry at next visit.   Seasonal and perennial allergic rhinitis Past history - 2018 skin testing was positive to dust mites and tree pollen. Interim history - rhinorrhea worse. Atrovent not effective anymore.  Continue environmental control measures. Stop Atrovent (ipratropium). Use azelastine nasal spray 1-2 sprays per nostril twice a day as needed for runny nose/drainage. Use Flonase (fluticasone) nasal spray 1 spray per nostril twice a day as needed for nasal congestion.  Nasal saline spray (i.e., Simply Saline) or nasal saline lavage (i.e., NeilMed) is recommended as needed and prior to medicated  nasal sprays. No antihistamines due to past issues with dehydration.    LPRD (laryngopharyngeal reflux disease) Stable. Unable to wean off meds.  Continue Nexium 40mg  daily. Continue pepcid 40mg  daily.     Bee sting allergy Interim history - 2023 bloodwork negative to hymenoptera panel, tryptase 8.4. For mild symptoms you can take over the counter antihistamines such as Benadryl and monitor symptoms closely. If symptoms worsen or if you have severe symptoms including breathing issues, throat closure, significant swelling, whole body hives, severe diarrhea and vomiting, lightheadedness then inject epinephrine and seek immediate medical care afterwards.  Assessment and Plan: [name] [name] is a 72 y.o. female with: Moderate persistent asthma without complication ***  Seasonal allergic rhinitis due to pollen Allergic rhinitis due to dust mite Past history - 2018 skin testing was positive to dust mites and tree pollen. Interim history -  LPRD (laryngopharyngeal reflux disease) ***  Bee sting allergy Past history - 2023 bloodwork negative to hymenoptera panel, tryptase 8.4.  Assessment and Plan              No follow-ups on file.  No orders of the defined types were placed in this encounter.  Lab Orders  No laboratory test(s) ordered today    Diagnostics: Spirometry:  Tracings reviewed. Her effort: {Blank single:19197::"Good reproducible efforts.","It was hard to get consistent efforts and there is a question as to whether this reflects a maximal maneuver.","Poor effort, data can not be interpreted."} FVC: ***L FEV1: ***L, ***% predicted FEV1/FVC ratio: ***% Interpretation: {Blank single:19197::"Spirometry consistent with mild obstructive disease","Spirometry consistent with moderate obstructive disease","Spirometry consistent with severe obstructive disease","Spirometry  consistent with possible restrictive disease","Spirometry consistent with mixed obstructive and restrictive  disease","Spirometry uninterpretable due to technique","Spirometry consistent with normal pattern","No overt abnormalities noted given today's efforts"}.  Please see scanned spirometry results for details.  Skin Testing: {Blank single:19197::"Select foods","Environmental allergy panel","Environmental allergy panel and select foods","Food allergy panel","None","Deferred due to recent antihistamines use"}. *** Results discussed with patient/family.   Medication List:  Current Outpatient Medications  Medication Sig Dispense Refill   EPINEPHRINE 0.3 mg/0.3 mL IJ SOAJ injection INJECT INTRAMUSCULARLY 1 PEN AS  NEEDED FOR ALLERGIC RESPONSE AS  DIRECTED BY MD. Assun[facility] Found MEDICAL  HELP AFTER USE. 2 each 1   Accu-Chek FastClix Lancets MISC Use to check blood sugars 3x per day. E11.9 306 each 12   ACCU-CHEK GUIDE test strip USE TO CHECK BLOOD SUGAR 3 TIMES DAILY 300 strip 2   acyclovir (ZOVIRAX) 200 MG capsule Take one by mouth daily for prophylaxis 90 capsule 3   albuterol (VENTOLIN HFA) 108 (90 Base) MCG/ACT inhaler USE 2 INHALATIONS BY MOUTH EVERY 4 HOURS AS NEEDED FOR WHEEZING  OR SHORTNESS OF BREATH 6.7 g 20   Alcohol Swabs (B-D SINGLE USE SWABS REGULAR) PADS USE THREE TIMES DAILY 300 each 3   allopurinol (ZYLOPRIM) 300 MG tablet Take 1 tablet (300 mg total) by mouth daily. 100 tablet 2   ammonium lactate (LAC-HYDRIN) 12 % lotion APPLY 1 APPLICATION TOPICALLY TWICE DAILY AS NEEDED FOR DRY SKIN (SUBSTITUTED FOR LAC HYDRIN) 400 g 12   aspirin EC 81 MG tablet Take 81 mg by mouth daily.     atorvastatin (LIPITOR) 40 MG tablet Take 1 tablet (40 mg total) by mouth daily. 100 tablet 2   B-D ULTRAFINE III SHORT PEN 31G X 8 MM MISC USE TO INJECT THREE TIMES A DAY 90 each 3   Blood Glucose Monitoring Suppl (ACCU-CHEK GUIDE) w/Device KIT USE TO CHECK BLOOD SUGAR 3 TIMES DAILY 1 kit 0   Brimonidine Tartrate-Timolol (COMBIGAN OP) Combigan     budesonide-formoterol (SYMBICORT) 160-4.5 MCG/ACT inhaler Inhale 2 puffs  into the lungs in the morning and at bedtime. with spacer and rinse mouth afterwards. 30.6 g 3   buPROPion (WELLBUTRIN) 100 MG tablet Take 1 tablet (100 mg total) by mouth 2 (two) times daily. 180 tablet 1   Cholecalciferol (HM VITAMIN D3) 100 MCG (4000 UT) CAPS Take 1 capsule (4,000 Units total) by mouth daily in the afternoon. 100 capsule 3   COMBIGAN 0.2-0.5 % ophthalmic solution INSTILL 1 DROP INTO THE  LEFT EYE EVERY 12 HOURS 5 mL 12   DULoxetine (CYMBALTA) 60 MG capsule TAKE 1 CAPSULE BY MOUTH DAILY 90 capsule 3   esomeprazole (NEXIUM) 40 MG capsule Take 1 capsule (40 mg total) by mouth daily. 90 capsule 3   famotidine (PEPCID) 40 MG tablet Take 1 tablet (40 mg total) by mouth daily. 90 tablet 3   fluticasone (FLONASE) 50 MCG/ACT nasal spray Place 1 spray into both nostrils in the morning and at bedtime. 48 g 3   furosemide (LASIX) 40 MG tablet Take one tablet by mouth daily PRN 90 tablet 3   HUMALOG KWIKPEN 100 UNIT/ML KwikPen INJECT SUBCUTANEOUSLY 35 UNITS  TWICE DAILY AFTER MEALS AS  DIRECTED (Patient taking differently: 20 Units daily.) 75 mL 2   Insulin Syringe-Needle U-100 (BD INSULIN SYRINGE ULTRAFINE) 31G X 15/64" 0.5 ML MISC Use to inject three times a day 300 each 2   irbesartan (AVAPRO) 300 MG tablet Take 1 tablet (300 mg total) by mouth daily.  LANTUS SOLOSTAR 100 UNIT/ML Solostar Pen INJECT SUBCUTANEOUSLY 35 UNITS  TWICE DAILY (Patient taking differently: 35 Units daily. INJECT SUBCUTANEOUSLY 35  UNITS TWICE DAILY) 75 mL 2   latanoprost (XALATAN) 0.005 % ophthalmic solution Place 1 drop into both eyes at bedtime. 2.5 mL 3   LINZESS 290 MCG CAPS capsule Take 1 capsule (290 mcg total) by mouth daily as needed. 100 capsule 3   metoCLOPramide (REGLAN) 5 MG tablet Take one by mouth twice a day as directed 180 tablet 3   metoprolol succinate (TOPROL-XL) 50 MG 24 hr tablet TAKE 3 TABLETS BY MOUTH DAILY  WITH OR IMMEDIATELY FOLLOWING A  MEAL 300 tablet 2   montelukast (SINGULAIR) 10 MG  tablet Take 1 tablet (10 mg total) by mouth daily. 100 tablet 3   Multiple Vitamins-Minerals (MULTIVITAMIN PO) Take 1 tablet by mouth daily.     REPATHA SURECLICK 140 MG/ML SOAJ INJECT 140MG  SUBCUTANEOUSLY  EVERY 2 WEEKS 6 mL 3   RESTASIS MULTIDOSE 0.05 % ophthalmic emulsion Place 1 drop into both eyes daily as needed.  3   spironolactone (ALDACTONE) 25 MG tablet TAKE 1 TABLET BY MOUTH DAILY  BEFORE SUPPER 100 tablet 2   TRULICITY 1.5 MG/0.5ML SOPN INJECT SUBCUTANEOUSLY THE  CONTENTS OF ONE PEN WEEKLY AS  DIRECTED 6 mL 3   No current facility-administered medications for this visit.   Allergies: Allergies  Allergen Reactions   Bee Venom Hives and Swelling   Propoxyphene Hcl Itching   Amlodipine Besylate Swelling   Hydrocodone Other (See Comments)    With Vicodin - makes patient "jittery", and "hangover effect - sleepy next day"   Lisinopril Cough    Changed to ARB   Metformin And Related Diarrhea    GI distress   Metronidazole Other (See Comments)     "just didn't work; my body never did heal from it"   Valsartan Other (See Comments)      "sleep more the next day after I took it; it made me real tired"   Sulfasalazine Other (See Comments)    Other reaction(s): Unknown   Xyzal [Levocetirizine Dihydrochloride] Rash   I reviewed her past medical history, social history, family history, and environmental history and no significant changes have been reported from her previous visit.  Review of Systems  Constitutional:  Negative for appetite change, chills, fever and unexpected weight change.  HENT:  Positive for rhinorrhea. Negative for congestion.   Eyes:  Negative for itching.  Respiratory:  Negative for cough, chest tightness, shortness of breath and wheezing.   Gastrointestinal:  Negative for abdominal pain.  Skin:  Negative for rash.  Allergic/Immunologic: Positive for environmental allergies. Negative for food allergies.  Neurological:  Negative for headaches.     Objective: There were no vitals taken for this visit. There is no height or weight on file to calculate BMI. Physical Exam Vitals and nursing note reviewed.  Constitutional:      Appearance: She is well-developed.  HENT:     Head: Normocephalic and atraumatic.     Right Ear: External ear normal.     Left Ear: External ear normal.     Nose: Nose normal.  Eyes:     Conjunctiva/sclera: Conjunctivae normal.  Cardiovascular:     Rate and Rhythm: Normal rate and regular rhythm.     Heart sounds: Normal heart sounds. No murmur heard.    No friction rub. No gallop.  Pulmonary:     Effort: Pulmonary effort is normal.  Breath sounds: Normal breath sounds. No wheezing or rales.  Musculoskeletal:     Cervical back: Neck supple.  Skin:    General: Skin is warm.     Findings: No rash.  Neurological:     Mental Status: She is alert and oriented to person, place, and time.  Psychiatric:        Behavior: Behavior normal.    Previous notes and tests were reviewed. The plan was reviewed with the patient/family, and all questions/concerned were addressed.  It was my pleasure to see Lonisha today and participate in her care. Please feel free to contact me with any questions or concerns.  Sincerely,  Wyline Mood, DO Allergy & Immunology  [facility] office: [phone] [facility] office: [phone]

## 2023-06-25 ENCOUNTER — Encounter: Payer: Self-pay | Admitting: Allergy

## 2023-06-25 ENCOUNTER — Ambulatory Visit (INDEPENDENT_AMBULATORY_CARE_PROVIDER_SITE_OTHER): Payer: 59 | Admitting: Allergy

## 2023-06-25 ENCOUNTER — Other Ambulatory Visit: Payer: Self-pay

## 2023-06-25 VITALS — BP 124/80 | HR 82 | Temp 98.2°F | Resp 20 | Ht 61.25 in | Wt 172.1 lb

## 2023-06-25 DIAGNOSIS — Z91030 Bee allergy status: Secondary | ICD-10-CM | POA: Diagnosis not present

## 2023-06-25 DIAGNOSIS — J4540 Moderate persistent asthma, uncomplicated: Secondary | ICD-10-CM | POA: Diagnosis not present

## 2023-06-25 DIAGNOSIS — K219 Gastro-esophageal reflux disease without esophagitis: Secondary | ICD-10-CM

## 2023-06-25 DIAGNOSIS — J3089 Other allergic rhinitis: Secondary | ICD-10-CM

## 2023-06-25 DIAGNOSIS — J301 Allergic rhinitis due to pollen: Secondary | ICD-10-CM | POA: Diagnosis not present

## 2023-06-25 NOTE — Patient Instructions (Addendum)
Moderate persistent asthma  Daily controller medication(s): continue Symbicort 2 puffs twice a day with spacer and rinse mouth afterwards. Continue Singulair 10mg  daily  May use albuterol rescue inhaler 2 puffs every 4 to 6 hours as needed for shortness of breath, chest tightness, coughing, and wheezing. May use albuterol rescue inhaler 2 puffs 5 to 15 minutes prior to strenuous physical activities. Monitor frequency of use - if you need to use it more than twice per week on a consistent basis let us know.  Asthma control goals:  Full participation in all desired activities (may need albuterol before activity) Albuterol use two times or less a week on average (not counting use with activity) Cough interfering with sleep two times or less a month Oral steroids no more than once a year No hospitalizations   Seasonal and perennial allergic rhinitis 2018 skin testing was positive to dust mites and tree pollen. Continue environmental control measures. Use azelastine nasal spray 1-2 sprays per nostril twice a day as needed for runny nose/drainage. Use Flonase (fluticasone) nasal spray 1 spray per nostril twice a day as needed for nasal congestion.  Nasal saline spray (i.e., Simply Saline) or nasal saline lavage (i.e., NeilMed) is recommended as needed and prior to medicated nasal sprays.   LPRD (laryngopharyngeal reflux disease) Continue Nexium 40mg  daily. Continue Pepcid 40mg  daily.     Bee sting allergy Bloodwork was negative.  For mild symptoms you can take over the counter antihistamines such as Benadryl and monitor symptoms closely. If symptoms worsen or if you have severe symptoms including breathing issues, throat closure, significant swelling, whole body hives, severe diarrhea and vomiting, lightheadedness then inject epinephrine and seek immediate medical care afterwards.  Follow up in 6 months or sooner if needed.   Recommend flu and Covid-19 booster.

## 2023-06-26 ENCOUNTER — Other Ambulatory Visit: Payer: Self-pay | Admitting: Allergy

## 2023-06-26 DIAGNOSIS — H4089 Other specified glaucoma: Secondary | ICD-10-CM | POA: Diagnosis not present

## 2023-06-26 LAB — HM DIABETES EYE EXAM

## 2023-07-02 ENCOUNTER — Other Ambulatory Visit: Payer: Self-pay | Admitting: Internal Medicine

## 2023-07-03 ENCOUNTER — Other Ambulatory Visit: Payer: Self-pay

## 2023-07-03 ENCOUNTER — Other Ambulatory Visit (HOSPITAL_COMMUNITY): Payer: Self-pay

## 2023-07-03 MED ORDER — IRBESARTAN 300 MG PO TABS
300.0000 mg | ORAL_TABLET | Freq: Every day | ORAL | 1 refills | Status: DC
  Filled 2023-07-03: qty 90, 90d supply, fill #0

## 2023-07-23 ENCOUNTER — Ambulatory Visit: Payer: 59 | Admitting: Family Medicine

## 2023-07-23 VITALS — BP 137/66 | HR 76 | Ht 61.0 in | Wt 174.4 lb

## 2023-07-23 DIAGNOSIS — E1149 Type 2 diabetes mellitus with other diabetic neurological complication: Secondary | ICD-10-CM | POA: Diagnosis not present

## 2023-07-23 DIAGNOSIS — F331 Major depressive disorder, recurrent, moderate: Secondary | ICD-10-CM

## 2023-07-23 DIAGNOSIS — Z23 Encounter for immunization: Secondary | ICD-10-CM

## 2023-07-23 DIAGNOSIS — I10 Essential (primary) hypertension: Secondary | ICD-10-CM

## 2023-07-23 DIAGNOSIS — E785 Hyperlipidemia, unspecified: Secondary | ICD-10-CM | POA: Diagnosis not present

## 2023-07-23 NOTE — Patient Instructions (Signed)
Please see me in February of 2025

## 2023-07-24 ENCOUNTER — Other Ambulatory Visit: Payer: Self-pay

## 2023-07-24 ENCOUNTER — Encounter (HOSPITAL_COMMUNITY): Payer: Self-pay | Admitting: Psychiatry

## 2023-07-24 ENCOUNTER — Encounter: Payer: Self-pay | Admitting: Family Medicine

## 2023-07-24 ENCOUNTER — Ambulatory Visit (HOSPITAL_COMMUNITY): Payer: 59 | Admitting: Psychiatry

## 2023-07-24 VITALS — BP 150/78 | HR 76 | Ht 61.0 in | Wt 173.0 lb

## 2023-07-24 DIAGNOSIS — F330 Major depressive disorder, recurrent, mild: Secondary | ICD-10-CM | POA: Diagnosis not present

## 2023-07-24 DIAGNOSIS — F411 Generalized anxiety disorder: Secondary | ICD-10-CM

## 2023-07-24 MED ORDER — DULOXETINE HCL 60 MG PO CPEP: 60.0000 mg | ORAL_CAPSULE | Freq: Every day | ORAL | 1 refills | Status: DC

## 2023-07-24 MED ORDER — BUPROPION HCL 100 MG PO TABS: 100.0000 mg | ORAL_TABLET | Freq: Two times a day (BID) | ORAL | 1 refills | Status: DC

## 2023-07-24 NOTE — Assessment & Plan Note (Signed)
Excellent blood pressure control.  No problems.  Continue on metoprolol XL 50 daily.  Will check labs at next visit.  Specifically we will look at kidney function and electrolytes.

## 2023-07-24 NOTE — Assessment & Plan Note (Signed)
She continues on atorvastatin and Repatha without problem.  She gets Repatha from her cardiologist and they are following that.

## 2023-07-24 NOTE — Progress Notes (Signed)
    CHIEF COMPLAINT / HPI:   #1.  Diabetes mellitus: She continues to take her Trulicity regularly, every week.  She takes her other insulin on her own schedule and she smiles a big smile she tells me this.  She does not need refills right now.  She is not having any problems with any of her medicines since she has had noted low blood sugar episodes. 2.  Hypertension: She is taking her blood pressure medicine regularly.  No chest pain, no shortness of breath.  No problems with her medicines. 3.  Hyperlipidemia: She is taking her cholesterol medicine every day.  She is not particularly following a specific low-cholesterol diet. 4.  Wants to get her flu shot today.  She wants to get an updated COVID booster but does not want to get it in the same week she got her flu shot so she wants to know when she should get that.  PERTINENT  PMH / PSH: I have reviewed the patient's medications, allergies, past medical and surgical history, smoking status and updated in the EMR as appropriate.   OBJECTIVE:  BP 137/66   Pulse 76   Ht 5\' 1"  (1.549 m)   Wt 174 lb 6.4 oz (79.1 kg)   SpO2 100%   BMI 32.95 kg/m  GENERAL: Well-developed female no acute distress HEENT: Resting nystagmus.  Blind bilaterally. GAIT: Walks using a cane.  Slightly unsteady but can rise from a chair with minimal assistance. CV: Regular rate and rhythm Lungs: Clear to auscultation bilaterally without rales or wheeze.  No increased work of breathing PSYCH: AxOx4. Good eye contact.. No psychomotor retardation or agitation. Appropriate speech fluency and content. Asks and answers questions appropriately. Mood is congruent and she seems pretty happy today and is joking around.   ASSESSMENT / PLAN:  Health maintenance and immunization: Flu vaccine today.  We are out of COVID shot so I could not give it to her today if she wanted it which she adamantly does not want in the same week as her flu shot.  I gave her some options of pharmacies  versus calling next week and seeing if we have the booster shot back in.  I do think she should get it. Diabetes mellitus type 2 with neurological manifestations (HCC) We do not have lab draw here today so to get her labs she would have to go across the street to [place].  Given her visual impairment I do not think this is a great idea so we will get it when I see her back in 3 months.  She has had great blood sugar control since she started doing her own regimen and since she started Trulicity.  I cannot argue with success.  No changes.  HYPERTENSION, BENIGN SYSTEMIC Excellent blood pressure control.  No problems.  Continue on metoprolol XL 50 daily.  Will check labs at next visit.  Specifically we will look at kidney function and electrolytes.  Hyperlipidemia She continues on atorvastatin and Repatha without problem.  She gets Repatha from her cardiologist and they are following that.   Denny Levy MD

## 2023-07-24 NOTE — Assessment & Plan Note (Signed)
We do not have lab draw here today so to get her labs she would have to go across the street to [place].  Given her visual impairment I do not think this is a great idea so we will get it when I see her back in 3 months.  She has had great blood sugar control since she started doing her own regimen and since she started Trulicity.  I cannot argue with success.  No changes.

## 2023-07-24 NOTE — Progress Notes (Signed)
Psychiatric Initial Adult Assessment   Patient Location: Office Provider Location: Office  Patient Identification: [name] [name] MRN:  [id number] Date of Evaluation:  71/21/2024 Referral Source: Former patient of Dr [name] [name] Chief Complaint:   Chief Complaint  Patient presents with   Establish Care   Anxiety   Visit Diagnosis:    ICD-10-CM   1. Mild episode of recurrent major depressive disorder (HCC)  F33.0 buPROPion (WELLBUTRIN) 100 MG tablet    DULoxetine (CYMBALTA) 60 MG capsule    2. GAD (generalized anxiety disorder)  F41.1 buPROPion (WELLBUTRIN) 100 MG tablet    DULoxetine (CYMBALTA) 60 MG capsule      History of Present Illness: [name] [name] is a 72 African-American single female who lives by herself.  She is a patient of Dr. Michae [name] who had left the practice.  She had missed 2 appointments and her last visit was in September.  Patient has multiple health issues including bilateral neuronal hearing loss, congenital bilateral cataract and glaucoma.  She uses hearing aid.  He has a history of depression and anxiety.  She started seeing psychiatrist in our office since 2016.  She was referred from primary care.  She was taking Zoloft for depression and anxiety but remained depressed and sad.  Her medicines were changed.  She was given Wellbutrin and Cymbalta added.  She has been combination of these medication for a while.  She reported things are much better.  She recalled depression got worse mid years ago when she find out that her son is bisexual.  She had a hard time with the news.  With the help of medication she did very well.  She has limitation due to her hearing and visual problems.  She uses scat service for doctor's appointment.  She is very connected with the charge and had a good social network.  Her friends take her to the shopping.  She does cook sometimes when she like to.  She uses magnifying glasses for vision when she need to read something.  She is involved in  church activities.  She is about to running low on her medication.  So far she is tolerating medication and reported no side effects.  She denies any mania, psychosis, hallucination.  She denies any crying spells or any feeling of hopelessness.  She takes the Wellbutrin 200 mg and Cymbalta 60 mg before she go to bed.  She admitted sometime sleep is not as good despite using the CPAP machine.  She lived by herself to sister live close by.  Her 86 year old son lives in [place] and she does talk to him once in a while.  She denies any agitation, aggression, crying spells.  Her appetite is okay.  Her weight is stable.  She recently had a blood work for her diabetes and her hemoglobin A1c 6.4 is stable.  She does not want to change the medication.  She is not interested in therapy.  Patient told her son's father is deceased.  Patient told they never married.  Patient denies any history of seizures, head trauma.    Associated Signs/Symptoms: Depression Symptoms:  depressed mood, difficulty concentrating, anxiety, disturbed sleep, (Hypo) Manic Symptoms:   none reported Anxiety Symptoms:  Excessive Worry, Psychotic Symptoms:   none reported PTSD Symptoms: Negative  Past Psychiatric History: History of depression and anxiety for many years.  Prescribed Zoloft from PCP after to stop working.  Establish care with a psychiatrist in 2016 seeing Dr. Michae [name] who left the practice.  No history of  suicidal attempt, psychosis, inpatient treatment.  No history of drug use.  Previous Psychotropic Medications: Yes   Substance Abuse History in the last 12 months:  No.  Consequences of Substance Abuse: NA  Past Medical History:  Past Medical History:  Diagnosis Date   Allergy    Angina    Anxiety    Arthritis    Asthma    Blood transfusion    Blood transfusion without reported diagnosis    Cataract    Chronic cough    Now followed by pulmonary   Chronic lower back pain    Complication of anesthesia     "just wake up coughing; that's all"   Concussion 11/18/11   "fell at dr's office; hit head"   COPD (chronic obstructive pulmonary disease) (HCC)    Delayed gastric emptying    Depression    Diabetes mellitus type II    "take insulin & pills"   Dyslipidemia    Exertional dyspnea    GERD (gastroesophageal reflux disease)    Glaucoma    Gout    Headache(784.0) 11/18/11   "I've had mild headaches the last couple days"   Headache(784.0) 01/08/12   "pretty constant since 11/18/11's concussion"   Heart murmur    History of colonoscopy    HTN (hypertension)    Myocardial infarction (HCC)    Osteoporosis    Peripheral neuropathy    Pneumonia    "i've had it once" (11/18/11)   Sleep apnea    on CPAP 12   Syncope 06/16/2012   Vocal cord paralysis, unilateral partial    "right"    Past Surgical History:  Procedure Laterality Date   ANTERIOR CERVICAL DECOMP/DISCECTOMY FUSION  01/15/2012   Procedure: ANTERIOR CERVICAL DECOMPRESSION/DISCECTOMY FUSION 3 LEVELS;  Surgeon: [name] Alert, MD;  Location: [facility];  Service: Neurosurgery;  Laterality: N/A;  Anterior Cervical Decompression Discectomy Fusion Cerivcal three four, cervical four five, cervical five six, cervical six seven   BREAST BIOPSY     bilaterally   BREAST CYST EXCISION     right twice; left once   BREAST REDUCTION SURGERY  04-21-2003   BRONCHOSCOPY  07-2007   CARDIAC CATHETERIZATION  03-02-2004   CARPAL TUNNEL RELEASE     left   CATARACT EXTRACTION  1955; 1979   bilateral; right eye   CESAREAN SECTION  1987   COLONOSCOPY W/ BIOPSIES  11/21/2010   adenoma polyp--no hi grade dysplasia Dr [name] [name]   EYE SURGERY     KNEE ARTHROSCOPY  08/2000   right   LEFT AND RIGHT HEART CATHETERIZATION WITH CORONARY ANGIOGRAM N/A 01/19/2014   Procedure: LEFT AND RIGHT HEART CATHETERIZATION WITH CORONARY ANGIOGRAM;  Surgeon: [name] Noe, MD;  Location: [facility] CATH LAB;  Service: Cardiovascular;  Laterality: N/A;   REDUCTION MAMMAPLASTY  Bilateral    SHOULDER ARTHROSCOPY  08/2000   right   SHOULDER ARTHROSCOPY W/ ROTATOR CUFF REPAIR  10/18/2003   left   SPINE SURGERY     T-score  09/02/04   -0.54 (low risk currently)   TOTAL ABDOMINAL HYSTERECTOMY  10-07-2000   TREATMENT FISTULA ANAL     TUBAL LIGATION  1987    Family Psychiatric History: Reviewed  Family History:  Family History  Problem Relation Age of Onset   Diabetes Father    Heart disease Mother    Hypertension Sister    Hyperlipidemia Sister    Cancer Sister    Hypertension Brother    Hyperlipidemia Brother  Congestive Heart Failure Brother    Hypertension Sister    Colon cancer Neg Hx    Depression Neg Hx    Anxiety disorder Neg Hx     Social History:   Social History   Socioeconomic History   Marital status: Single    Spouse name: Not on file   Number of children: 1   Years of education: 12   Highest education level: 12th grade  Occupational History   Occupation: unemployed    Associate Professor: UNEMPLOYED  Tobacco Use   Smoking status: Former    Current packs/day: 0.00    Average packs/day: 0.5 packs/day for 0.5 years (0.2 ttl pk-yrs)    Types: Cigarettes    Start date: 03/05/1975    Quit date: 09/03/1975    Years since quitting: 47.9    Passive exposure: Past   Smokeless tobacco: Never   Tobacco comments:    no plans to start again  Vaping Use   Vaping status: Never Used  Substance and Sexual Activity   Alcohol use: No   Drug use: No   Sexual activity: Not Currently  Other Topics Concern   Not on file  Social History Narrative      Emergency Contact: sister, catherine Brisbon [phone]      Who lives with you: Lives alone, in ground level apartment; has smoke alarms. No throw rugs on floor. Has grab bars at tub and toilet.   Any pets: none   Diet: Pt has a varied diet of protein, starch, and vegetables.   Exercise: Not currently exercising   Seatbelts: Pt reports wearing seatbelt when in vehicles.    Hobbies: tv, church and  church activities, volunteer work                                                                                                   Chemical engineer Strain: Low Risk  (02/21/2023)   Overall Financial Resource Strain (CARDIA)    Difficulty of Paying Living Expenses: Not hard at all  Food Insecurity: No Food Insecurity (02/21/2023)   Hunger Vital Sign    Worried About Running Out of Food in the Last Year: Never true    Ran Out of Food in the Last Year: Never true  Transportation Needs: No Transportation Needs (02/21/2023)   PRAPARE - Administrator, Civil Service (Medical): No    Lack of Transportation (Non-Medical): No  Physical Activity: Inactive (02/21/2023)   Exercise Vital Sign    Days of Exercise per Week: 0 days    Minutes of Exercise per Session: 0 min  Stress: No Stress Concern Present (02/21/2023)   [employer] of Occupational Health - Occupational Stress Questionnaire    Feeling of Stress : Not at all  Social Connections: Moderately Integrated (02/21/2023)   Social Connection and Isolation Panel [NHANES]    Frequency of Communication with Friends and Family: More than three times a week    Frequency of Social Gatherings with Friends and Family: Twice a week    Attends Religious Services: More than 4  times per year    Active Member of Clubs or Organizations: Yes    Attends Banker Meetings: Never    Marital Status: Never married    Additional Social History: Patient born and raised in [place].  Parents are deceased.  She did finish high school school for visual and hearing impaired population.  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  She enjoyed her work until she could not continue her job due to her health issues.  Patient never married.  She has a 64 year old son who live in [place] and she does talk to him on and off.  She has 2 sisters who live close by and she is in touch with them on a regular basis.   She has a good social network from [employer].  Allergies:   Allergies  Allergen Reactions   Bee Venom Hives and Swelling   Propoxyphene Hcl Itching   Amlodipine Besylate Swelling   Hydrocodone Other (See Comments)    With Vicodin - makes patient "jittery", and "hangover effect - sleepy next day"   Lisinopril Cough    Changed to ARB   Metformin And Related Diarrhea    GI distress   Metronidazole Other (See Comments)     "just didn't work; my body never did heal from it"   Valsartan Other (See Comments)      "sleep more the next day after I took it; it made me real tired"   Sulfasalazine Other (See Comments)    Other reaction(s): Unknown   Xyzal [Levocetirizine Dihydrochloride] Rash    Metabolic Disorder Labs: Lab Results  Component Value Date   HGBA1C 6.4 04/30/2023   MPG 134 (H) 06/17/2012   No results found for: "PROLACTIN" Lab Results  Component Value Date   CHOL 93 (L) 12/12/2022   TRIG 95 12/12/2022   HDL 38 (L) 12/12/2022   CHOLHDL 2.4 12/12/2022   VLDL 16 10/20/2015   LDLCALC 37 12/12/2022   LDLCALC 18 03/27/2022   Lab Results  Component Value Date   TSH 1.370 12/12/2022    Therapeutic Level Labs: No results found for: "LITHIUM" No results found for: "CBMZ" No results found for: "VALPROATE"  Current Medications: Current Outpatient Medications  Medication Sig Dispense Refill   Accu-Chek FastClix Lancets MISC Use to check blood sugars 3x per day. E11.9 306 each 12   ACCU-CHEK GUIDE test strip USE TO CHECK BLOOD SUGAR 3 TIMES DAILY 300 strip 2   acyclovir (ZOVIRAX) 200 MG capsule Take one by mouth daily for prophylaxis 90 capsule 3   albuterol (VENTOLIN HFA) 108 (90 Base) MCG/ACT inhaler USE 2 INHALATIONS BY MOUTH EVERY 4 HOURS AS NEEDED FOR WHEEZING  OR SHORTNESS OF BREATH 6.7 g 20   Alcohol Swabs (B-D SINGLE USE SWABS REGULAR) PADS USE THREE TIMES DAILY 300 each 3   allopurinol (ZYLOPRIM) 300 MG tablet Take 1 tablet (300 mg total) by mouth daily. 100  tablet 2   ammonium lactate (LAC-HYDRIN) 12 % lotion APPLY 1 APPLICATION TOPICALLY TWICE DAILY AS NEEDED FOR DRY SKIN (SUBSTITUTED FOR LAC HYDRIN) 400 g 12   aspirin EC 81 MG tablet Take 81 mg by mouth daily.     atorvastatin (LIPITOR) 40 MG tablet Take 1 tablet (40 mg total) by mouth daily. 100 tablet 2   Azelastine HCl 137 MCG/SPRAY SOLN Place into both nostrils.     B-D ULTRAFINE III SHORT PEN 31G X 8 MM MISC USE TO INJECT THREE TIMES A DAY 90 each  3   Blood Glucose Monitoring Suppl (ACCU-CHEK GUIDE) w/Device KIT USE TO CHECK BLOOD SUGAR 3 TIMES DAILY 1 kit 0   budesonide-formoterol (SYMBICORT) 160-4.5 MCG/ACT inhaler Inhale 2 puffs into the lungs in the morning and at bedtime. with spacer and rinse mouth afterwards. 30.6 g 3   buPROPion (WELLBUTRIN) 100 MG tablet Take 1 tablet (100 mg total) by mouth 2 (two) times daily. 180 tablet 1   Cholecalciferol (HM VITAMIN D3) 100 MCG (4000 UT) CAPS Take 1 capsule (4,000 Units total) by mouth daily in the afternoon. 100 capsule 3   COMBIGAN 0.2-0.5 % ophthalmic solution INSTILL 1 DROP INTO THE  LEFT EYE EVERY 12 HOURS 5 mL 12   DULoxetine (CYMBALTA) 60 MG capsule TAKE 1 CAPSULE BY MOUTH DAILY 90 capsule 3   EPINEPHRINE 0.3 mg/0.3 mL IJ SOAJ injection INJECT 1 PEN INTRAMUSCULARLY AS  NEEDED FOR ALLERGIC RESPONSE AS  DIRECTED BY MD. Assunta Found MEDICAL  HELP AFTER USE 2 each 1   esomeprazole (NEXIUM) 40 MG capsule Take 1 capsule (40 mg total) by mouth daily. 90 capsule 3   famotidine (PEPCID) 40 MG tablet Take 1 tablet (40 mg total) by mouth daily. 90 tablet 3   fluticasone (FLONASE) 50 MCG/ACT nasal spray Place 1 spray into both nostrils in the morning and at bedtime. 48 g 3   furosemide (LASIX) 40 MG tablet Take one tablet by mouth daily PRN 90 tablet 3   HUMALOG KWIKPEN 100 UNIT/ML KwikPen INJECT SUBCUTANEOUSLY 35 UNITS  TWICE DAILY AFTER MEALS AS  DIRECTED (Patient taking differently: 20 Units daily.) 75 mL 2   Insulin Syringe-Needle U-100 (BD INSULIN SYRINGE  ULTRAFINE) 31G X 15/64" 0.5 ML MISC Use to inject three times a day (Patient not taking: Reported on 06/25/2023) 300 each 2   irbesartan (AVAPRO) 300 MG tablet Take 1 tablet (300 mg total) by mouth daily. 90 tablet 1   LANTUS SOLOSTAR 100 UNIT/ML Solostar Pen INJECT SUBCUTANEOUSLY 35 UNITS  TWICE DAILY (Patient taking differently: 35 Units daily. INJECT SUBCUTANEOUSLY 35  UNITS TWICE DAILY) 75 mL 2   latanoprost (XALATAN) 0.005 % ophthalmic solution Place 1 drop into both eyes at bedtime. 2.5 mL 3   LINZESS 290 MCG CAPS capsule Take 1 capsule (290 mcg total) by mouth daily as needed. 100 capsule 3   metoCLOPramide (REGLAN) 5 MG tablet Take one by mouth twice a day as directed 180 tablet 3   metoprolol succinate (TOPROL-XL) 50 MG 24 hr tablet TAKE 3 TABLETS BY MOUTH DAILY  WITH OR IMMEDIATELY FOLLOWING A  MEAL 300 tablet 2   montelukast (SINGULAIR) 10 MG tablet Take 1 tablet (10 mg total) by mouth daily. 100 tablet 3   Multiple Vitamins-Minerals (MULTIVITAMIN PO) Take 1 tablet by mouth daily.     REPATHA SURECLICK 140 MG/ML SOAJ INJECT 140MG  SUBCUTANEOUSLY  EVERY 2 WEEKS 6 mL 3   RESTASIS MULTIDOSE 0.05 % ophthalmic emulsion Place 1 drop into both eyes daily as needed.  3   spironolactone (ALDACTONE) 25 MG tablet TAKE 1 TABLET BY MOUTH DAILY  BEFORE SUPPER 100 tablet 2   TRULICITY 1.5 MG/0.5ML SOPN INJECT SUBCUTANEOUSLY THE  CONTENTS OF ONE PEN WEEKLY AS  DIRECTED 6 mL 3   TYRVAYA 0.03 MG/ACT SOLN Place into both nostrils.     No current facility-administered medications for this visit.    Musculoskeletal: Strength & Muscle Tone: within normal limits Gait & Station: unsteady, use walker Patient leans: N/A  Psychiatric Specialty Exam: Review of Systems  HENT:  Positive for hearing loss.        Use hearing aid  Eyes:  Positive for visual disturbance.    Blood pressure (!) 150/78, pulse 76, height 5\' 1"  (1.549 m), weight 173 lb (78.5 kg).Body mass index is 32.69 kg/m.  General Appearance:  Casual and use walker due to visual impairment  Eye Contact:  Fair  Speech:  Slow  Volume:  Decreased  Mood:  Euthymic  Affect:  Congruent  Thought Process:  Descriptions of Associations: Intact  Orientation:  Full (Time, Place, and Person)  Thought Content:  WDL  Suicidal Thoughts:  No  Homicidal Thoughts:  No  Memory:  Immediate;   Fair Recent;   Fair Remote;   Good  Judgement:  Intact  Insight:  Present  Psychomotor Activity:  Decreased  Concentration:  Concentration: Fair and Attention Span: Fair  Recall:  Good  Fund of Knowledge:Fair  Language: Fair  Akathisia:  No  Handed:  Right  AIMS (if indicated):  not done  Assets:  Communication Skills Desire for Improvement Housing Social Support  ADL's:  Intact  Cognition: WNL  Sleep:  Good with CPAP   Screenings: GAD-7    Flowsheet Row [facility]  Total GAD-7 Score 3      Mini-Mental    Flowsheet Row Clinical Support from 09/16/2018 in [facility] [place]. [facility] Office Visit from 05/27/2012 in [facility] Of Eligha Bridegroom. [facility]  Total Score (max 30 points ) 30 30      PHQ2-9    Flowsheet Row Office Visit from 07/24/2023 in [facility] Clinical Support from 02/21/2023 in [facility] Of [place]. [facility] Office Visit from 01/08/2023 in [facility] Of [facility] P[facility]. [facility] Office Visit from 06/05/2022 in [facility] Of [place]. [facility] Video Visit from 05/16/2022 in [facility]  PHQ-2 Total Score 2 0 0 0 0  PHQ-9 Total Score 4 3 4  0 --      Flowsheet Row Office Visit from 07/24/2023 in [facility] Video Visit from 05/16/2022 [facility] Video Visit from 01/03/2022 in [facility]  C-SSRS RISK CATEGORY No Risk No Risk No Risk       Assessment and Plan: Daune is 72 year old herself.  She has a history of diabetes, bilateral congenital cataracts, glaucoma hearing loss.  I reviewed blood work results, medical history, psychosocial.  She is quite independent and able to do the ADLs.  She uses magnifying glasses to read the medication instructions.  She does take scat service for doctor's appointment and sometime her friends help to take her to the grocery stores.  She reported current medicine seems to be working that she does not have any worsening of symptoms or side effects.  I recommend to try taking the Wellbutrin 100 mg twice a day rather than taking both pills at bedtime.  She reported sometime despite using CPAP does not sleep very well.  I recommend taking the Wellbutrin in the morning and in the afternoon may help her sleep.  I offered therapy but patient not interested.  She had a good social network from [employer].  Continue Cymbalta 60 mg daily.  Recommended to call us back if she has any question, concern or if she feels worsening of the symptoms.  We will follow-up in 6 months in person  Collaboration of Care: Other provider involved in patient's care AEB notes are available in epic to review  Patient/Guardian was advised Release of Information must be obtained prior to any record release in order to collaborate their care with an outside provider. Patient/Guardian was advised if they have not already done so to contact the registration department to sign all necessary forms in order for [place] to release information regarding their care.   Consent: Patient/Guardian gives verbal consent for treatment and assignment of benefits for services provided during this visit. Patient/Guardian expressed understanding and agreed to proceed.   I provided 47 minutes  face to face time during this encounter.  Cleotis Nipper, MD 11/21/202410:03 AM

## 2023-07-24 NOTE — Assessment & Plan Note (Signed)
She continues to follow with psychiatry.  Seems to be doing well from a mood perspective.

## 2023-08-01 ENCOUNTER — Other Ambulatory Visit: Payer: Self-pay | Admitting: Internal Medicine

## 2023-08-28 ENCOUNTER — Other Ambulatory Visit: Payer: Self-pay | Admitting: Internal Medicine

## 2023-09-18 ENCOUNTER — Encounter: Payer: Self-pay | Admitting: Podiatry

## 2023-09-18 ENCOUNTER — Ambulatory Visit: Payer: 59 | Admitting: Podiatry

## 2023-09-18 DIAGNOSIS — B351 Tinea unguium: Secondary | ICD-10-CM

## 2023-09-18 DIAGNOSIS — E1142 Type 2 diabetes mellitus with diabetic polyneuropathy: Secondary | ICD-10-CM

## 2023-09-18 DIAGNOSIS — M79674 Pain in right toe(s): Secondary | ICD-10-CM

## 2023-09-18 DIAGNOSIS — M79675 Pain in left toe(s): Secondary | ICD-10-CM | POA: Diagnosis not present

## 2023-09-18 NOTE — Progress Notes (Signed)
This patient returns to my office for at risk foot care.  This patient requires this care by a professional since this patient will be at risk due to having diabetes.    This patient is unable to cut nails herself since the patient cannot reach her nails.These nails are painful walking and wearing shoes.  This patient presents for at risk foot care today.  General Appearance  Alert, conversant and in no acute stress.  Vascular  Dorsalis pedis and posterior tibial  pulses are palpable  bilaterally.  Capillary return is within normal limits  bilaterally. Temperature is within normal limits  bilaterally.  Neurologic  Senn-Weinstein monofilament wire test within normal limits  bilaterally. Muscle power within normal limits bilaterally.  Nails Thick disfigured discolored nails with subungual debris  from hallux to fifth toes bilaterally. No evidence of bacterial infection or drainage bilaterally.  Orthopedic  No limitations of motion  feet .  No crepitus or effusions noted.  No bony pathology or digital deformities noted.  Skin  normotropic skin with no porokeratosis noted bilaterally.  No signs of infections or ulcers noted.     Onychomycosis  Pain in right toes  Pain in left toes  Consent was obtained for treatment procedures.   Mechanical debridement of nails 1-5  bilaterally performed with a nail nipper.  Filed with dremel without incident.    Return office visit    3 months                  Told patient to return for periodic foot care and evaluation due to potential at risk complications.   Jayleigh Notarianni DPM   

## 2023-10-02 DIAGNOSIS — H4052X2 Glaucoma secondary to other eye disorders, left eye, moderate stage: Secondary | ICD-10-CM | POA: Diagnosis not present

## 2023-10-02 DIAGNOSIS — E119 Type 2 diabetes mellitus without complications: Secondary | ICD-10-CM | POA: Diagnosis not present

## 2023-10-02 LAB — HM DIABETES EYE EXAM

## 2023-11-07 ENCOUNTER — Other Ambulatory Visit: Payer: Self-pay | Admitting: Family Medicine

## 2023-11-07 DIAGNOSIS — E1149 Type 2 diabetes mellitus with other diabetic neurological complication: Secondary | ICD-10-CM

## 2023-11-10 ENCOUNTER — Other Ambulatory Visit: Payer: Self-pay | Admitting: Allergy

## 2023-11-10 ENCOUNTER — Other Ambulatory Visit: Payer: Self-pay | Admitting: Family Medicine

## 2023-11-12 ENCOUNTER — Ambulatory Visit: Payer: 59 | Admitting: Family Medicine

## 2023-11-18 DIAGNOSIS — G4733 Obstructive sleep apnea (adult) (pediatric): Secondary | ICD-10-CM | POA: Diagnosis not present

## 2023-11-26 ENCOUNTER — Encounter: Payer: Self-pay | Admitting: Family Medicine

## 2023-11-29 ENCOUNTER — Other Ambulatory Visit: Payer: Self-pay | Admitting: Allergy

## 2023-12-02 ENCOUNTER — Other Ambulatory Visit: Payer: Self-pay | Admitting: Family Medicine

## 2023-12-02 ENCOUNTER — Other Ambulatory Visit: Payer: Self-pay | Admitting: Internal Medicine

## 2023-12-08 ENCOUNTER — Other Ambulatory Visit (HOSPITAL_COMMUNITY): Payer: Self-pay | Admitting: Psychiatry

## 2023-12-08 DIAGNOSIS — F330 Major depressive disorder, recurrent, mild: Secondary | ICD-10-CM

## 2023-12-08 DIAGNOSIS — F411 Generalized anxiety disorder: Secondary | ICD-10-CM

## 2023-12-10 ENCOUNTER — Ambulatory Visit: Payer: 59 | Admitting: Family Medicine

## 2023-12-10 ENCOUNTER — Other Ambulatory Visit: Payer: Self-pay | Admitting: Family Medicine

## 2023-12-12 ENCOUNTER — Ambulatory Visit: Admitting: Family Medicine

## 2023-12-12 VITALS — BP 122/58 | HR 84 | Wt 172.2 lb

## 2023-12-12 DIAGNOSIS — R1110 Vomiting, unspecified: Secondary | ICD-10-CM

## 2023-12-12 DIAGNOSIS — R634 Abnormal weight loss: Secondary | ICD-10-CM | POA: Diagnosis not present

## 2023-12-12 DIAGNOSIS — Z794 Long term (current) use of insulin: Secondary | ICD-10-CM

## 2023-12-12 DIAGNOSIS — K5730 Diverticulosis of large intestine without perforation or abscess without bleeding: Secondary | ICD-10-CM

## 2023-12-12 DIAGNOSIS — E1149 Type 2 diabetes mellitus with other diabetic neurological complication: Secondary | ICD-10-CM | POA: Diagnosis not present

## 2023-12-12 DIAGNOSIS — I10 Essential (primary) hypertension: Secondary | ICD-10-CM

## 2023-12-12 LAB — POCT GLYCOSYLATED HEMOGLOBIN (HGB A1C): HbA1c, POC (controlled diabetic range): 6.4 % (ref 0.0–7.0)

## 2023-12-12 NOTE — Patient Instructions (Addendum)
 We are sending a referral into Dr. Kenna Gilbert office.  Hopefully they will call you within the next 3 to 5 days and set up an appointment.  I am also checking some blood work today and will send you a note about that or call you if there is anything terribly abnormal.  I would like to see you back after you see Dr. Loreta Ave so would probably be best to make an appointment to see me in 1 month.  Your mammogram and DEXA scan can be done in May or June of this year.  Lets get this stomach thing taken care of first and then we will get that scheduled for you.  I need you to call the office if things with the vomiting or abdominal issues get worse.

## 2023-12-13 ENCOUNTER — Encounter: Payer: Self-pay | Admitting: Family Medicine

## 2023-12-13 LAB — CBC
Hematocrit: 37.6 % (ref 34.0–46.6)
Hemoglobin: 12.4 g/dL (ref 11.1–15.9)
MCH: 29.8 pg (ref 26.6–33.0)
MCHC: 33 g/dL (ref 31.5–35.7)
MCV: 90 fL (ref 79–97)
Platelets: 196 10*3/uL (ref 150–450)
RBC: 4.16 x10E6/uL (ref 3.77–5.28)
RDW: 12.4 % (ref 11.7–15.4)
WBC: 7 10*3/uL (ref 3.4–10.8)

## 2023-12-13 LAB — COMPREHENSIVE METABOLIC PANEL WITH GFR
ALT: 21 IU/L (ref 0–32)
AST: 25 IU/L (ref 0–40)
Albumin: 4.1 g/dL (ref 3.8–4.8)
Alkaline Phosphatase: 92 IU/L (ref 44–121)
BUN/Creatinine Ratio: 11 — ABNORMAL LOW (ref 12–28)
BUN: 10 mg/dL (ref 8–27)
Bilirubin Total: 0.4 mg/dL (ref 0.0–1.2)
CO2: 22 mmol/L (ref 20–29)
Calcium: 9.7 mg/dL (ref 8.7–10.3)
Chloride: 106 mmol/L (ref 96–106)
Creatinine, Ser: 0.88 mg/dL (ref 0.57–1.00)
Globulin, Total: 2.5 g/dL (ref 1.5–4.5)
Glucose: 110 mg/dL — ABNORMAL HIGH (ref 70–99)
Potassium: 4.2 mmol/L (ref 3.5–5.2)
Sodium: 143 mmol/L (ref 134–144)
Total Protein: 6.6 g/dL (ref 6.0–8.5)
eGFR: 70 mL/min/{1.73_m2} (ref >59)

## 2023-12-13 LAB — LIPASE: Lipase: 41 U/L (ref 14–85)

## 2023-12-13 NOTE — Progress Notes (Signed)
    CHIEF COMPLAINT / HPI:   #1.  Vomiting almost daily for the last 3 weeks.  No matter what she eats.  Had 1 day when she just drank some coffee and then threw all that up.  Has some nausea for few minutes right before throwing up.  After she throws up she does not feel like eating so she is typically only eating 1 meal a day although that may be her baseline.  Bowel movements have remained normal for her which include some constipation.  No blood in her stool.  Outside of right before vomiting, no abdominal pain or nausea.  Has not noticed any particular fatigue.  Does note she previously had a gastric ulcer and it feels sort of like that but maybe not totally. 2.  Follow-up diabetes mellitus: Still taking her medicines on her own schedule.  No episodes of low blood sugar.  No increased frequency of urination. 3.  Follow-up health maintenance: She wants to know when she can get her DEXA scan next and when her mammogram is due 4.  Hypertension: Taking medicines regularly without problem.  No chest pain no shortness of breath with exertion.  No lower extremity edema.  Activity level remains baseline.  PERTINENT  PMH / PSH: I have reviewed the patient's medications, allergies, past medical and surgical history, smoking status and updated in the EMR as appropriate.   OBJECTIVE:  BP (!) 122/58   Pulse 84   Wt 172 lb 3.2 oz (78.1 kg)   SpO2 98%   BMI 32.54 kg/m  GENERAL: Well-developed female no acute distress HEENT: Intermittent resting horizontal nystagmus consistent with visual loss bilaterally.  Neck is without lymphadenopathy. CV: Regular rate and rhythm Lungs: No increased work of breathing.  Normal respiratory rate.  Clear to auscultation bilaterally. ABDOMEN: Soft, positive bowel sounds in all quadrants.  No rebound or guarding.  Left flank area where she sometimes has intermittent pain is benign to deep palpation. back: No CVA tenderness. VASCULAR: Normal cap refill hands and feet.   Radial and dorsalis pedis pulses 2+ bilaterally symmetrical. PSYCH: AxOx4. Good eye contact.. No psychomotor retardation or agitation. Appropriate speech fluency and content. Asks and answers questions appropriately. Mood is congruent.   ASSESSMENT / PLAN: #1.  Vomiting x 3 weeks.  Daily.  Nonbilious.  She does still have her gallbladder.  She had complained of some intermittent left flank pain but exam is benign.  Historically she has had some left abdominal/flank pain for years and workup to date has been negative.  She does have a history of diverticulitis and history of remote gastric ulcer.  She sees Dr. Tova Fresh and I will refer her back to gastroenterologist urgently.  Will check some labs today and call her if they are abnormal otherwise I will send her a note regarding results 2.  Hypertension is at goal.  Will continue current medications 3.  Diabetes mellitus: Will check A1c today.  She tends to follow her own medication regimen but seems to be working for her. 4.  Health maintenance: Mammogram due end of May early June and we will try to get DEXA scan at the same time. Follow-up: I will have her see me back in a few weeks and we have given her an appointment for that.  Should she develop worsening symptoms of vomiting or abdominal discomfort she will let me know in the interim.  No problem-specific Assessment & Plan notes found for this encounter.   Violetta Grice MD

## 2023-12-18 ENCOUNTER — Encounter: Payer: Self-pay | Admitting: Podiatry

## 2023-12-18 ENCOUNTER — Ambulatory Visit (INDEPENDENT_AMBULATORY_CARE_PROVIDER_SITE_OTHER): Payer: 59 | Admitting: Podiatry

## 2023-12-18 DIAGNOSIS — M79674 Pain in right toe(s): Secondary | ICD-10-CM | POA: Diagnosis not present

## 2023-12-18 DIAGNOSIS — M79675 Pain in left toe(s): Secondary | ICD-10-CM | POA: Diagnosis not present

## 2023-12-18 DIAGNOSIS — E1142 Type 2 diabetes mellitus with diabetic polyneuropathy: Secondary | ICD-10-CM

## 2023-12-18 DIAGNOSIS — B351 Tinea unguium: Secondary | ICD-10-CM | POA: Diagnosis not present

## 2023-12-18 NOTE — Progress Notes (Signed)

## 2023-12-19 ENCOUNTER — Ambulatory Visit: Payer: 59 | Admitting: Internal Medicine

## 2023-12-23 ENCOUNTER — Other Ambulatory Visit: Payer: Self-pay | Admitting: Internal Medicine

## 2023-12-23 ENCOUNTER — Other Ambulatory Visit (HOSPITAL_COMMUNITY): Payer: Self-pay | Admitting: Psychiatry

## 2023-12-23 DIAGNOSIS — K3184 Gastroparesis: Secondary | ICD-10-CM | POA: Diagnosis not present

## 2023-12-23 DIAGNOSIS — K5730 Diverticulosis of large intestine without perforation or abscess without bleeding: Secondary | ICD-10-CM | POA: Diagnosis not present

## 2023-12-23 DIAGNOSIS — K219 Gastro-esophageal reflux disease without esophagitis: Secondary | ICD-10-CM | POA: Diagnosis not present

## 2023-12-23 DIAGNOSIS — R112 Nausea with vomiting, unspecified: Secondary | ICD-10-CM | POA: Diagnosis not present

## 2023-12-23 DIAGNOSIS — F330 Major depressive disorder, recurrent, mild: Secondary | ICD-10-CM

## 2023-12-23 DIAGNOSIS — F411 Generalized anxiety disorder: Secondary | ICD-10-CM

## 2023-12-23 NOTE — Progress Notes (Unsigned)
 Subjective:   [name] [name] is a 73 y.o. female who presents for Medicare Annual (Subsequent) preventive examination.  Patient consented to have virtual visit and was identified by name and date of birth. Method of visit: {TELEPHONE VS ZOXWR:60454}  Encounter participants: Patient: [name] [name] - located at *** Nurse/Provider: Genora Kidd - located at *** Others (if applicable): ***    Review of Systems:  ***       Objective:     Vitals: There were no vitals taken for this visit.  There is no height or weight on file to calculate BMI.     04/30/2023   10:51 AM 01/08/2023   10:54 AM 09/05/2022    3:01 PM 06/05/2022    9:03 AM 01/23/2022    8:42 AM 12/19/2021   11:05 AM 04/18/2021   11:09 AM  Advanced Directives  Does Patient Have a Medical Advance Directive? No No No No No No No  Would patient like information on creating a medical advance directive? No - Patient declined No - Patient declined No - Patient declined No - Patient declined No - Patient declined No - Patient declined No - Patient declined    Tobacco Social History   Tobacco Use  Smoking Status Former   Current packs/day: 0.00   Average packs/day: 0.5 packs/day for 0.5 years (0.2 ttl pk-yrs)   Types: Cigarettes   Start date: 03/05/1975   Quit date: 09/03/1975   Years since quitting: 48.3   Passive exposure: Past  Smokeless Tobacco Never  Tobacco Comments   no plans to start again     Counseling given: Not Answered Tobacco comments: no plans to start again   Clinical Intake:                       Past Medical History:  Diagnosis Date   Allergy     Angina    Anxiety    Arthritis    Asthma    Blood transfusion    Blood transfusion without reported diagnosis    Cataract    Chronic cough    Now followed by pulmonary   Chronic lower back pain    Complication of anesthesia    "just wake up coughing; that's all"   Concussion 11/18/11   "fell at dr's office; hit  head"   COPD (chronic obstructive pulmonary disease) (HCC)    Delayed gastric emptying    Depression    Diabetes mellitus type II    "take insulin  & pills"   Dyslipidemia    Exertional dyspnea    GERD (gastroesophageal reflux disease)    Glaucoma    Gout    Headache(784.0) 11/18/11   "I've had mild headaches the last couple days"   Headache(784.0) 01/08/12   "pretty constant since 11/18/11's concussion"   Heart murmur    History of colonoscopy    HTN (hypertension)    Myocardial infarction (HCC)    Osteoporosis    Peripheral neuropathy    Pneumonia    "i've had it once" (11/18/11)   Sleep apnea    on CPAP 12   Syncope 06/16/2012   Vocal cord paralysis, unilateral partial    "right"   Past Surgical History:  Procedure Laterality Date   ANTERIOR CERVICAL DECOMP/DISCECTOMY FUSION  01/15/2012   Procedure: ANTERIOR CERVICAL DECOMPRESSION/DISCECTOMY FUSION 3 LEVELS;  Surgeon: Isadora Mar, MD;  Location: [facility];  Service: Neurosurgery;  Laterality: N/A;  Anterior Cervical Decompression Discectomy Fusion Cerivcal  three four, cervical four five, cervical five six, cervical six seven   BREAST BIOPSY     bilaterally   BREAST CYST EXCISION     right twice; left once   BREAST REDUCTION SURGERY  04-21-2003   BRONCHOSCOPY  07-2007   CARDIAC CATHETERIZATION  03-02-2004   CARPAL TUNNEL RELEASE     left   CATARACT EXTRACTION  1955; 1979   bilateral; right eye   CESAREAN SECTION  1987   COLONOSCOPY W/ BIOPSIES  11/21/2010   adenoma polyp--no hi grade dysplasia Dr Tova Fresh   EYE SURGERY     KNEE ARTHROSCOPY  08/2000   right   LEFT AND RIGHT HEART CATHETERIZATION WITH CORONARY ANGIOGRAM N/A 01/19/2014   Procedure: LEFT AND RIGHT HEART CATHETERIZATION WITH CORONARY ANGIOGRAM;  Surgeon: Mickiel Albany, MD;  Location: [facility];  Service: Cardiovascular;  Laterality: N/A;   REDUCTION MAMMAPLASTY Bilateral    SHOULDER ARTHROSCOPY  08/2000   right   SHOULDER ARTHROSCOPY W/ ROTATOR CUFF  REPAIR  10/18/2003   left   SPINE SURGERY     T-score  09/02/04   -0.54 (low risk currently)   TOTAL ABDOMINAL HYSTERECTOMY  10-07-2000   TREATMENT FISTULA ANAL     TUBAL LIGATION  1987   Family History  Problem Relation Age of Onset   Diabetes Father    Heart disease Mother    Hypertension Sister    Hyperlipidemia Sister    Cancer Sister    Hypertension Brother    Hyperlipidemia Brother    Congestive Heart Failure Brother    Hypertension Sister    Colon cancer Neg Hx    Depression Neg Hx    Anxiety disorder Neg Hx    Social History   Socioeconomic History   Marital status: Single    Spouse name: Not on file   Number of children: 1   Years of education: 12   Highest education level: 12th grade  Occupational History   Occupation: unemployed    Associate Professor: UNEMPLOYED  Tobacco Use   Smoking status: Former    Current packs/day: 0.00    Average packs/day: 0.5 packs/day for 0.5 years (0.2 ttl pk-yrs)    Types: Cigarettes    Start date: 03/05/1975    Quit date: 09/03/1975    Years since quitting: 48.3    Passive exposure: Past   Smokeless tobacco: Never   Tobacco comments:    no plans to start again  Vaping Use   Vaping status: Never Used  Substance and Sexual Activity   Alcohol use: No   Drug use: No   Sexual activity: Not Currently  Other Topics Concern   Not on file  Social History Narrative      Emergency Contact: sister, catherine Larrabee [phone]      Who lives with you: Lives alone, in ground level apartment; has smoke alarms. No throw rugs on floor. Has grab bars at tub and toilet.   Any pets: none   Diet: Pt has a varied diet of protein, starch, and vegetables.   Exercise: Not currently exercising   Seatbelts: Pt reports wearing seatbelt when in vehicles.    Hobbies: tv, church and church activities, volunteer work  Social Drivers of Research scientist (physical sciences) Strain: Low Risk  (02/21/2023)   Overall Financial Resource Strain (CARDIA)    Difficulty of Paying Living Expenses: Not hard at all  Food Insecurity: No Food Insecurity (02/21/2023)   Hunger Vital Sign    Worried About Running Out of Food in the Last Year: Never true    Ran Out of Food in the Last Year: Never true  Transportation Needs: No Transportation Needs (02/21/2023)   PRAPARE - Administrator, Civil Service (Medical): No    Lack of Transportation (Non-Medical): No  Physical Activity: Inactive (02/21/2023)   Exercise Vital Sign    Days of Exercise per Week: 0 days    Minutes of Exercise per Session: 0 min  Stress: No Stress Concern Present (02/21/2023)   [employer] of Occupational Health - Occupational Stress Questionnaire    Feeling of Stress : Not at all  Social Connections: Moderately Integrated (02/21/2023)   Social Connection and Isolation Panel [NHANES]    Frequency of Communication with Friends and Family: More than three times a week    Frequency of Social Gatherings with Friends and Family: Twice a week    Attends Religious Services: More than 4 times per year    Active Member of [employer] or Organizations: Yes    Attends Banker Meetings: Never    Marital Status: Never married    Outpatient Encounter Medications as of 12/24/2023  Medication Sig   Accu-Chek FastClix Lancets MISC Use to check blood sugars 3x per day. E11.9   ACCU-CHEK GUIDE TEST test strip USE TO CHECK BLOOD SUGAR 3 TIMES DAILY   acyclovir  (ZOVIRAX ) 200 MG capsule Take one by mouth daily for prophylaxis   albuterol  (VENTOLIN  HFA) 108 (90 Base) MCG/ACT inhaler USE 2 INHALATIONS BY MOUTH EVERY 4 HOURS AS NEEDED FOR WHEEZING  OR SHORTNESS OF BREATH   Alcohol Swabs (B-D SINGLE USE SWABS REGULAR) PADS USE THREE TIMES DAILY   allopurinol  (ZYLOPRIM ) 300 MG tablet Take 1 tablet (300 mg total) by mouth daily.   ammonium lactate  (LAC-HYDRIN ) 12 % lotion APPLY 1 APPLICATION  TOPICALLY TWICE DAILY AS NEEDED FOR DRY SKIN (SUBSTITUTED FOR LAC HYDRIN)   aspirin  EC 81 MG tablet Take 81 mg by mouth daily.   atorvastatin  (LIPITOR) 40 MG tablet Take 1 tablet (40 mg total) by mouth daily.   Azelastine  HCl 137 MCG/SPRAY SOLN Place into both nostrils.   B-D ULTRAFINE III SHORT PEN 31G X 8 MM MISC USE TO INJECT THREE TIMES A DAY   Blood Glucose Monitoring Suppl (ACCU-CHEK GUIDE) w/Device KIT USE TO CHECK BLOOD SUGAR 3 TIMES DAILY   budesonide -formoterol  (SYMBICORT ) 160-4.5 MCG/ACT inhaler Inhale 2 puffs into the lungs in the morning and at bedtime. with spacer and rinse mouth afterwards.   buPROPion  (WELLBUTRIN ) 100 MG tablet Take 1 tablet (100 mg total) by mouth 2 (two) times daily.   Cholecalciferol  (HM VITAMIN D3) 100 MCG (4000 UT) CAPS Take 1 capsule (4,000 Units total) by mouth daily in the afternoon.   COMBIGAN  0.2-0.5 % ophthalmic solution INSTILL 1 DROP INTO THE  LEFT EYE EVERY 12 HOURS   DULoxetine  (CYMBALTA ) 60 MG capsule Take 1 capsule (60 mg total) by mouth daily.   EPINEPHRINE  0.3 mg/0.3 mL IJ SOAJ injection INJECT 1 PEN INTRAMUSCULARLY AS  NEEDED FOR ALLERGIC RESPONSE AS  DIRECTED BY MD. Salina Craver MEDICAL  HELP AFTER USE   esomeprazole  (NEXIUM ) 40 MG capsule TAKE 1 CAPSULE BY MOUTH DAILY  Evolocumab  (REPATHA  SURECLICK) 140 MG/ML SOAJ Inject 1 pen under the skin every 14 days. Need office visit for further refills   famotidine  (PEPCID ) 40 MG tablet TAKE 1 TABLET BY MOUTH DAILY   fluticasone  (FLONASE ) 50 MCG/ACT nasal spray Place 1 spray into both nostrils in the morning and at bedtime.   furosemide  (LASIX ) 40 MG tablet Take one tablet by mouth daily PRN   insulin  lispro (HUMALOG  KWIKPEN) 100 UNIT/ML KwikPen INJECT SUBCUTANEOUSLY 35 UNITS  TWICE DAILY AFTER MEALS AS  DIRECTED   Insulin  Syringe-Needle U-100 (BD INSULIN  SYRINGE ULTRAFINE) 31G X 15/64" 0.5 ML MISC Use to inject three times a day   irbesartan  (AVAPRO ) 300 MG tablet TAKE 1 TABLET BY MOUTH DAILY   LANTUS   SOLOSTAR 100 UNIT/ML Solostar Pen INJECT SUBCUTANEOUSLY 35 UNITS  TWICE DAILY   latanoprost  (XALATAN ) 0.005 % ophthalmic solution Place 1 drop into both eyes at bedtime.   LINZESS  290 MCG CAPS capsule Take 1 capsule (290 mcg total) by mouth daily as needed.   metoCLOPramide  (REGLAN ) 5 MG tablet Take one by mouth twice a day as directed   metoprolol  succinate (TOPROL -XL) 50 MG 24 hr tablet TAKE 3 TABLETS BY MOUTH DAILY  WITH OR IMMEDIATELY FOLLOWING A  MEAL   montelukast  (SINGULAIR ) 10 MG tablet TAKE 1 TABLET BY MOUTH DAILY   Multiple Vitamins-Minerals (MULTIVITAMIN PO) Take 1 tablet by mouth daily.   RESTASIS MULTIDOSE 0.05 % ophthalmic emulsion Place 1 drop into both eyes daily as needed.   spironolactone  (ALDACTONE ) 25 MG tablet TAKE 1 TABLET BY MOUTH DAILY  BEFORE SUPPER   TRULICITY  1.5 MG/0.5ML SOPN INJECT SUBCUTANEOUSLY THE  CONTENTS OF ONE PEN WEEKLY AS  DIRECTED   TYRVAYA 0.03 MG/ACT SOLN Place into both nostrils.   No facility-administered encounter medications on file as of 12/24/2023.    Activities of Daily Living    02/21/2023    2:12 PM  In your present state of health, do you have any difficulty performing the following activities:  Hearing? 1  Comment Hearing Aid  Vision? 1  Difficulty concentrating or making decisions? 0  Walking or climbing stairs? 1  Dressing or bathing? 0  Doing errands, shopping? 1    Patient Care Team: Charise Companion, MD as PCP - General Elmyra Haggard, MD as PCP - Cardiology (Cardiology) Tami Falcon, MD as Consulting Physician (Gastroenterology) Elmyra Haggard, MD as Consulting Physician (Cardiology) Dorothe Gaster, RD as Dietitian (Family Medicine) Faustina Hood, MD as Consulting Physician (Pulmonary Disease) Dot Gazella, DPM as Consulting Physician (Podiatry) Abbott Hockey, DMD (Dentistry) Abbey Abbe, RPH-CPP (Pharmacist) Trudy Fusi, DO as Consulting Physician (Allergy ) Fulton Job, MD (Inactive) (Psychiatry) Reinhold Carbine, MD as Referring Physician (Specialist) Cindra Cree, MD as Consulting Physician (Ophthalmology)    Assessment:   This is a routine wellness examination for [facility].  Exercise Activities and Dietary recommendations     Goals       Exercise 2x per week (15-30 min per time)      Weight < 186 lb (84.4 kg) (pt-stated)      7% weight loss        Fall Risk    07/23/2023   10:11 AM 04/30/2023   10:51 AM 02/21/2023    2:12 PM 01/08/2023   10:54 AM 09/05/2022    3:01 PM  Fall Risk   Falls in the past year? 1 1 1  0 1  Number falls in past yr: 0 1 0  1  Injury with Fall?  0 0    Risk for fall due to :  History of fall(s);Impaired mobility;Impaired balance/gait No Fall Risks    Follow up  Falls prevention discussed Falls evaluation completed     Is the patient's home free of loose throw rugs in walkways, pet beds, electrical cords, etc?   {Blank single:19197::"yes","no"}      Grab bars in the bathroom? {Blank single:19197::"yes","no"}      Handrails on the stairs?   {Blank single:19197::"yes","no"}      Adequate lighting?   {Blank single:19197::"yes","no"}  Patient rating of health (0-10) scale: ***   Depression Screen    12/12/2023   10:20 AM 07/24/2023   10:52 AM 07/24/2023   10:00 AM 04/30/2023   10:51 AM  PHQ 2/9 Scores  PHQ - 2 Score      PHQ- 9 Score      Exception Documentation Patient refusal   Patient refusal     Information is confidential and restricted. Go to Review Flowsheets to unlock data.     Cognitive Function    09/16/2018   10:25 AM 05/27/2012    2:00 PM  MMSE - Mini Mental State Exam  Orientation to time 5 5  Orientation to Place 5 5  Registration 3 3  Attention/ Calculation 5 5  Recall 3 3  Language- name 2 objects 2 2  Language- repeat 1 1  Language- follow 3 step command 3 3  Language- read & follow direction 1 1  Write a sentence 1 1  Copy design 1 1  Total score 30 30        02/21/2023    2:13 PM 03/23/2021    8:54 AM  09/16/2018   10:26 AM  6CIT Screen  What Year? 0 points 0 points 0 points  What month? 0 points 0 points 0 points  What time? 0 points 0 points 0 points  Count back from 20 0 points 0 points 0 points  Months in reverse 4 points 0 points 0 points  Repeat phrase 0 points 0 points 0 points  Total Score 4 points 0 points 0 points    Immunization History  Administered Date(s) Administered   Fluad Quad(high Dose 65+) 05/30/2021, 06/05/2022   Fluad Trivalent(High Dose 65+) 07/23/2023   H1N1 08/11/2008   Influenza Split 05/17/2011, 05/27/2012   Influenza Whole 05/25/2007, 06/08/2008, 06/02/2009, 06/05/2010   Influenza,inj,Quad PF,6+ Mos 04/28/2013, 06/01/2014, 05/18/2015, 05/17/2016, 04/30/2017, 06/03/2018, 07/14/2019, 06/14/2020   PFIZER Comirnaty(Gray Top)Covid-19 Tri-Sucrose Vaccine 01/10/2021   PFIZER(Purple Top)SARS-COV-2 Vaccination 01/14/2020, 02/04/2020, 09/06/2020   Pfizer Covid-19 Vaccine Bivalent Booster 29yrs & up 08/01/2021   Pfizer(Comirnaty)Fall Seasonal Vaccine 12 years and older 09/05/2022   Pneumococcal Conjugate-13 10/20/2013   Pneumococcal Polysaccharide-23 06/02/1993, 06/17/2012, 03/19/2018   Td 10/31/1996, 03/02/2008     Screening Tests Health Maintenance  Topic Date Due   Diabetic kidney evaluation - Urine ACR  Never done   Zoster Vaccines- Shingrix (1 of 2) Never done   DTaP/Tdap/Td (3 - Tdap) 03/02/2018   COVID-19 Vaccine (7 - 2024-25 season) 05/04/2023   Medicare Annual Wellness (AWV)  02/21/2024   FOOT EXAM  02/04/2024   INFLUENZA VACCINE  04/02/2024   HEMOGLOBIN A1C  06/12/2024   OPHTHALMOLOGY EXAM  10/01/2024   Diabetic kidney evaluation - eGFR measurement  12/11/2024   MAMMOGRAM  01/29/2025   Colonoscopy  03/15/2027   Pneumonia Vaccine 47+ Years old  Completed   DEXA SCAN  Completed   Hepatitis C Screening  Completed  HPV VACCINES  Aged Out   Meningococcal B Vaccine  Aged Out    Cancer Screenings: Lung: Low Dose CT Chest recommended if Age  8-80 years, 20 pack-year currently smoking OR have quit w/in 15years. Patient {DOES NOT does:27190::"does not"} qualify. Breast:  Up to date on Mammogram? {Yes/No:30480221}   Up to date of Bone Density/Dexa? {Yes/No:30480221} Colorectal: ***  Additional Screenings: ***: Hepatitis C Screening:      Plan:   ***   I have personally reviewed and noted the following in the patient's chart:   Medical and social history Use of alcohol, tobacco or illicit drugs  Current medications and supplements Functional ability and status Nutritional status Physical activity Advanced directives List of other physicians Hospitalizations, surgeries, and ER visits in previous 12 months Vitals Screenings to include cognitive, depression, and falls Referrals and appointments  In addition, I have reviewed and discussed with patient certain preventive protocols, quality metrics, and best practice recommendations. A written personalized care plan for preventive services as well as general preventive health recommendations were provided to patient.    This visit was conducted in person***  Genora Kidd, MD  12/23/2023

## 2023-12-24 ENCOUNTER — Other Ambulatory Visit (HOSPITAL_COMMUNITY): Payer: Self-pay | Admitting: Gastroenterology

## 2023-12-24 ENCOUNTER — Ambulatory Visit: Admitting: Student

## 2023-12-24 ENCOUNTER — Ambulatory Visit: Payer: 59 | Admitting: Allergy

## 2023-12-24 ENCOUNTER — Encounter: Payer: Self-pay | Admitting: Student

## 2023-12-24 VITALS — BP 126/67 | HR 70 | Ht 61.02 in | Wt 172.0 lb

## 2023-12-24 DIAGNOSIS — R112 Nausea with vomiting, unspecified: Secondary | ICD-10-CM

## 2023-12-24 DIAGNOSIS — Z0000 Encounter for general adult medical examination without abnormal findings: Secondary | ICD-10-CM

## 2023-12-24 NOTE — Patient Instructions (Addendum)
 I have added advance directive paperwork to this Please obtain DEXA scan and mammogram Please follow up with Dr. Andree Kayser for health maintenance screenings

## 2023-12-25 ENCOUNTER — Encounter: Payer: Self-pay | Admitting: Internal Medicine

## 2023-12-25 ENCOUNTER — Ambulatory Visit: Payer: 59 | Admitting: Internal Medicine

## 2023-12-25 VITALS — BP 126/60 | HR 78 | Temp 97.7°F | Ht 61.0 in | Wt 171.4 lb

## 2023-12-25 DIAGNOSIS — J988 Other specified respiratory disorders: Secondary | ICD-10-CM

## 2023-12-25 DIAGNOSIS — G4733 Obstructive sleep apnea (adult) (pediatric): Secondary | ICD-10-CM

## 2023-12-25 NOTE — Patient Instructions (Signed)
 Order- DME [name] [name]- Patient with limited eyesight. Please call and talk her through how to turn the humidifier down some, to reduce water condensation in hose.  Order- samples nasal saline gel- put a little in each nostril at bedtime or as needed for dryness and burning.

## 2023-12-25 NOTE — Progress Notes (Signed)
 Patient ID: [name] [name], female    DOB: 11-Feb-1951, 73 y.o.   MRN: [id number]   Brief patient profile:   25 yobf never regular smoker quit in the 1977   followed for asthma, sleep apnea, complicated by hx depression, vocal cord paralysis, GERD. NPSG 07/19/10  AHI 7.3/ hr, desaturation to 83%, body weight 186 lbs PFT 07/19/2010-moderate obstructive airways disease, insignificant response to bronchodilator, FEV1/FVC 0.65, TLC 63%, DLCO 71% office spirometry (Allergist Office)  09/09/2018-reviewed. FVC 1.75/86%, FEV1 1.33/84%, ratio 0.76, FEF 25-75% 1.22/78% -------------------------------------------------------------------------------------- 12/19/22- 73 year old female former occasional smoker followed for asthma/COPD, OSA, complicated by history Depression, Vocal Cord Paralysis, GERD, Obesity, DM 2, Glaucoma, Covid infection 2021, HTN,  CPAP auto 4-12/[facility]  -Singulair , Astelin , albuterol  hfa, Symbicort  160, Flonase , Download compliance- 80%, AHI 0.7/ hr Body weight today-168 lbs Download reviewed.  She skips a few nights at least partly because she gets dry stuffy nose.  We discussed saline gel for this.  Otherwise she is doing well with CPAP with good compliance and control.  She reports some nights having some difficulty with sleep onset but usually does fine. Breathing well with no recent exacerbation.  Needing inhalers refilled.  12/25/23- 73 year old female former occasional smoker followed for asthma/COPD, OSA, complicated by history Depression, Vocal Cord Paralysis, GERD, Obesity, DM 2, Glaucoma, Covid infection 2021, HTN,  CPAP auto 4-12/[facility]  -Singulair , Astelin , albuterol  hfa, Symbicort  160, Flonase , Download compliance-  Body weight today-171 lbs Compliance goals emphasized Discussed the use of AI scribe software for clinical note transcription with the patient, who gave verbal consent to proceed. History of Present Illness   The patient, with a history of sleep apnea, reports  issues with her CPAP machine, which is approximately 72 years old. She describes a gurgling noise and a small print message indicating something is unplugged. She has attempted to resolve the issue but has been unable due to transportation limitations. The patient also reports occasional nasal burning with CPAP use. She has looked for nasal saline gel, as previously recommended, but has been unable to locate it in the drugstore.  In addition to sleep apnea, the patient has a history of respiratory issues, for which she uses inhalers. She reports no current need for refills as she received medication about a month ago and typically requests refills every three months.     Assessment and Plan:    Respiratory condition requiring inhalers Continues inhaler use for management. No refill needed; medication dispensed one month ago, refilled every three months. - Continue current inhaler regimen. - Instruct her to request refills as needed.  Sleep apnea Not using CPAP due to gurgling noise and unplugged message. Machine is five years old; eligible for replacement in June. Gurgling likely from water condensation. Nasal burning from CPAP use. - Instruct her to elevate hose to drain water. - Advise reducing humidifier settings to minimize condensation. - Arrange [employer] instructions for humidifier adjustment. - Recommend nasal saline gel for nasal burning; provide sample if available.  Visual impairment Difficulty reading small print on CPAP machine due to visual impairment. - Provide verbal instructions and assistance as needed.     ROS-see HPI   + = positive Constitutional:    weight loss, night sweats, fevers, chills, fatigue, lassitude. HEENT:    headaches, difficulty swallowing, tooth/dental problems, sore throat,       sneezing, itching, ear ache, nasal congestion, post nasal drip, snoring CV:    chest pain, orthopnea, PND, swelling in lower extremities, anasarca,  dizziness, palpitations Resp:   shortness of breath with exertion or at rest.                productive cough,   non-productive cough, coughing up of blood.              change in color of mucus.  wheezing.   Skin:    rash or lesions. GI:  No-   heartburn, indigestion, abdominal pain, nausea, vomiting, diarrhea,                 change in bowel habits, loss of appetite GU: dysuria, change in color of urine, no urgency or frequency.   flank pain. MS:   joint pain, stiffness, decreased range of motion, back pain. Neuro-     nothing unusual Psych:  change in mood or affect.  depression or anxiety.   memory loss.  OBJ- Physical Exam General- Alert, Oriented, Affect-appropriate, Distress- none acute, + obese Skin- rash-none, lesions- none, excoriation- none Lymphadenopathy- none Head- atraumatic            Eyes- + impaired/ thick glasses,             Ears- Hearing, canals-normal            Nose- Clear, no-Septal dev, mucus, polyps, erosion, perforation             Throat- Mallampati II , mucosa clear , drainage- none, tonsils- atrophic Neck- flexible , trachea midline, no stridor , thyroid  nl, carotid no bruit Chest - symmetrical excursion , unlabored           Heart/CV- RRR , no murmur , no gallop  , no rub, nl s1 s2                           - JVD- none , edema- none, stasis changes- none, varices- none           Lung- clear to P&A, wheeze- none, cough- none , dullness-none, rub- none           Chest wall-  Abd-  Br/ Gen/ Rectal- Not done, not indicated Extrem- + rolling walker Neuro- grossly intact to observation

## 2023-12-29 ENCOUNTER — Other Ambulatory Visit: Payer: Self-pay | Admitting: Internal Medicine

## 2024-01-08 DIAGNOSIS — H2702 Aphakia, left eye: Secondary | ICD-10-CM | POA: Diagnosis not present

## 2024-01-08 DIAGNOSIS — H4052X3 Glaucoma secondary to other eye disorders, left eye, severe stage: Secondary | ICD-10-CM | POA: Diagnosis not present

## 2024-01-09 ENCOUNTER — Encounter (HOSPITAL_COMMUNITY)
Admission: RE | Admit: 2024-01-09 | Discharge: 2024-01-09 | Disposition: A | Discharge status: Home / Self Care | Source: Ambulatory Visit | Attending: Gastroenterology | Admitting: Gastroenterology

## 2024-01-09 ENCOUNTER — Ambulatory Visit (HOSPITAL_COMMUNITY)
Admission: RE | Admit: 2024-01-09 | Discharge: 2024-01-09 | Disposition: A | Discharge status: Home / Self Care | Source: Ambulatory Visit | Attending: Gastroenterology | Admitting: Gastroenterology

## 2024-01-09 DIAGNOSIS — R112 Nausea with vomiting, unspecified: Secondary | ICD-10-CM | POA: Insufficient documentation

## 2024-01-09 MED ORDER — TECHNETIUM TC 99M MEBROFENIN IV KIT
5.0000 | PACK | Freq: Once | INTRAVENOUS | Status: AC | PRN
  Administered 2024-01-09: 5 via INTRAVENOUS

## 2024-01-18 ENCOUNTER — Other Ambulatory Visit: Payer: Self-pay | Admitting: Internal Medicine

## 2024-01-21 ENCOUNTER — Other Ambulatory Visit: Payer: Self-pay | Admitting: Family Medicine

## 2024-01-21 ENCOUNTER — Telehealth: Payer: Self-pay

## 2024-01-21 ENCOUNTER — Encounter: Payer: Self-pay | Admitting: Family Medicine

## 2024-01-21 ENCOUNTER — Ambulatory Visit (INDEPENDENT_AMBULATORY_CARE_PROVIDER_SITE_OTHER): Admitting: Family Medicine

## 2024-01-21 VITALS — BP 122/80 | HR 84 | Wt 176.8 lb

## 2024-01-21 DIAGNOSIS — E1149 Type 2 diabetes mellitus with other diabetic neurological complication: Secondary | ICD-10-CM | POA: Diagnosis not present

## 2024-01-21 DIAGNOSIS — Z1231 Encounter for screening mammogram for malignant neoplasm of breast: Secondary | ICD-10-CM | POA: Diagnosis not present

## 2024-01-21 DIAGNOSIS — K5904 Chronic idiopathic constipation: Secondary | ICD-10-CM | POA: Diagnosis not present

## 2024-01-21 DIAGNOSIS — G8929 Other chronic pain: Secondary | ICD-10-CM

## 2024-01-21 DIAGNOSIS — I10 Essential (primary) hypertension: Secondary | ICD-10-CM | POA: Diagnosis not present

## 2024-01-21 DIAGNOSIS — Z13820 Encounter for screening for osteoporosis: Secondary | ICD-10-CM

## 2024-01-21 DIAGNOSIS — R109 Unspecified abdominal pain: Secondary | ICD-10-CM

## 2024-01-21 MED ORDER — LINZESS 290 MCG PO CAPS: ORAL_CAPSULE | ORAL | Status: DC

## 2024-01-21 NOTE — Patient Instructions (Signed)
 Your mammogram is on June 4th at 1:30. We will worry about scheduling your bone density after January.  Great to see you! Come back for an appointment in about 3 months.

## 2024-01-21 NOTE — Telephone Encounter (Signed)
 Called to cancel DSM appt// pt would like referral to [facility].

## 2024-01-22 ENCOUNTER — Other Ambulatory Visit: Payer: Self-pay

## 2024-01-22 ENCOUNTER — Encounter (HOSPITAL_COMMUNITY): Payer: Self-pay | Admitting: Psychiatry

## 2024-01-22 ENCOUNTER — Ambulatory Visit (HOSPITAL_BASED_OUTPATIENT_CLINIC_OR_DEPARTMENT_OTHER): Payer: 59 | Admitting: Psychiatry

## 2024-01-22 DIAGNOSIS — F330 Major depressive disorder, recurrent, mild: Secondary | ICD-10-CM | POA: Diagnosis not present

## 2024-01-22 DIAGNOSIS — F411 Generalized anxiety disorder: Secondary | ICD-10-CM | POA: Diagnosis not present

## 2024-01-22 MED ORDER — BUPROPION HCL 100 MG PO TABS: 100.0000 mg | ORAL_TABLET | Freq: Two times a day (BID) | ORAL | 1 refills | Status: DC

## 2024-01-22 MED ORDER — DULOXETINE HCL 60 MG PO CPEP: 60.0000 mg | ORAL_CAPSULE | Freq: Every day | ORAL | 1 refills | Status: AC

## 2024-01-22 NOTE — Progress Notes (Signed)
 [facility] MD/PA/NP OP Progress Note  Patient Location: Office Provider Location: Office  01/22/2024 11:05 AM [name] [name]  MRN:  [id number]  Chief Complaint:  Chief Complaint  Patient presents with   Follow-up   Anxiety   HPI: Patient came today for her third appointment.  She is a 73 year old female with history of depression and anxiety.  She was seeing Dr. Katrine Parody who left the practice.  On the last visit we adjusted the Wellbutrin  dose and she is taking 100 mg 2 times a day rather than taking 200 mg in the morning.  She reported sleep is improved.  She denies any irritability, anger, mania or psychosis.  She has a good social network which she described very close friend who takes her to the grocery stores and shopping.  Patient has visual impairment and she also uses hearing aid.  She uses scat services for doctors appointment.  She also very involved in church activities.  She talked to her son once a week who lives in the town.  Patient reported occasionally having shadows in her vision which could be due to her visual impairment but denies any paranoia, suicidal thoughts or homicidal thoughts.  She reported her anxiety depression is stable on current medication.  She has no tremors shakes or any EPS.  She is very close to her sister who live close by.  She denies any agitation, aggression, crying spells.  Her appetite is okay and her weight is stable.  Her last hemoglobin A1c is stable.  She does not want to change the medication.  Visit Diagnosis:    ICD-10-CM   1. GAD (generalized anxiety disorder)  F41.1 buPROPion  (WELLBUTRIN ) 100 MG tablet    DULoxetine  (CYMBALTA ) 60 MG capsule    2. Mild episode of recurrent major depressive disorder (HCC)  F33.0 buPROPion  (WELLBUTRIN ) 100 MG tablet    DULoxetine  (CYMBALTA ) 60 MG capsule      Past Psychiatric History: Reviewed H/O depression and anxiety. Took Zoloft  from PCP. Saw Dr Katrine Parody in 2016 and took Cymbalta  and later Wellbutrin   added. No history of suicidal attempt, psychosis, inpatient treatment. No history of drug use.   Past Medical History:  Past Medical History:  Diagnosis Date   Allergy     Angina    Anxiety    Arthritis    Asthma    Blood transfusion    Blood transfusion without reported diagnosis    Cataract    Chronic cough    Now followed by pulmonary   Chronic lower back pain    Complication of anesthesia    "just wake up coughing; that's all"   Concussion 11/18/2011   "fell at dr's office; hit head"   COPD (chronic obstructive pulmonary disease) (HCC)    Delayed gastric emptying    Depression    Diabetes mellitus type II    "take insulin  & pills"   Dyslipidemia    Exertional dyspnea    GERD (gastroesophageal reflux disease)    Glaucoma    Gout    Headache(784.0) 11/18/2011   "I've had mild headaches the last couple days"   Headache(784.0) 01/08/2012   "pretty constant since 11/18/11's concussion"   Heart murmur    History of colonoscopy    HTN (hypertension)    Peripheral neuropathy    Pneumonia    "i've had it once" (11/18/11)   Sleep apnea    on CPAP 12   Syncope 06/16/2012   Vocal cord paralysis, unilateral partial    "right"  Past Surgical History:  Procedure Laterality Date   ANTERIOR CERVICAL DECOMP/DISCECTOMY FUSION  01/15/2012   Procedure: ANTERIOR CERVICAL DECOMPRESSION/DISCECTOMY FUSION 3 LEVELS;  Surgeon: Isadora Mar, MD;  Location: [facility];  Service: Neurosurgery;  Laterality: N/A;  Anterior Cervical Decompression Discectomy Fusion Cerivcal three four, cervical four five, cervical five six, cervical six seven   BREAST BIOPSY     bilaterally   BREAST CYST EXCISION     right twice; left once   BREAST REDUCTION SURGERY  04-21-2003   BRONCHOSCOPY  07-2007   CARDIAC CATHETERIZATION  03-02-2004   CARPAL TUNNEL RELEASE     left   CATARACT EXTRACTION  1955; 1979   bilateral; right eye   CESAREAN SECTION  1987   COLONOSCOPY W/ BIOPSIES  11/21/2010   adenoma  polyp--no hi grade dysplasia Dr Tova Fresh   EYE SURGERY     KNEE ARTHROSCOPY  08/2000   right   LEFT AND RIGHT HEART CATHETERIZATION WITH CORONARY ANGIOGRAM N/A 01/19/2014   Procedure: LEFT AND RIGHT HEART CATHETERIZATION WITH CORONARY ANGIOGRAM;  Surgeon: Mickiel Albany, MD;  Location: [facility] CATH LAB;  Service: Cardiovascular;  Laterality: N/A;   REDUCTION MAMMAPLASTY Bilateral    SHOULDER ARTHROSCOPY  08/2000   right   SHOULDER ARTHROSCOPY W/ ROTATOR CUFF REPAIR  10/18/2003   left   SPINE SURGERY     T-score  09/02/04   -0.54 (low risk currently)   TOTAL ABDOMINAL HYSTERECTOMY  10-07-2000   TREATMENT FISTULA ANAL     TUBAL LIGATION  1987    Family Psychiatric History: Reviewed  Family History:  Family History  Problem Relation Age of Onset   Heart disease Mother    Diabetes Father    Gout Father    Hypertension Father    Hypertension Sister    Hyperlipidemia Sister    Cancer Sister    Hypertension Sister    Hypertension Brother    Hyperlipidemia Brother    Heart failure Brother    Congestive Heart Failure Brother    Diabetes Paternal Grandmother    Hypertension Paternal Grandfather    Autism Son    Colon cancer Neg Hx    Depression Neg Hx    Anxiety disorder Neg Hx     Social History:  Social History   Socioeconomic History   Marital status: Single    Spouse name: Not on file   Number of children: 1   Years of education: 12   Highest education level: 12th grade  Occupational History   Occupation: unemployed    Associate Professor: UNEMPLOYED  Tobacco Use   Smoking status: Former    Current packs/day: 0.00    Average packs/day: 0.5 packs/day for 0.5 years (0.2 ttl pk-yrs)    Types: Cigarettes    Start date: 03/05/1975    Quit date: 09/03/1975    Years since quitting: 48.4    Passive exposure: Past   Smokeless tobacco: Never   Tobacco comments:    no plans to start again  Vaping Use   Vaping status: Never Used  Substance and Sexual Activity   Alcohol use: No   Drug use:  No   Sexual activity: Not Currently    Birth control/protection: Post-menopausal  Other Topics Concern   Not on file  Social History Narrative      Emergency Contact: sister, catherine Cella [phone]      Who lives with you: Lives alone, in ground level apartment; has smoke alarms. No throw rugs on floor. Has  grab bars at tub and toilet.   Any pets: none   Diet: Pt has a varied diet of protein, starch, and vegetables.   Exercise: Not currently exercising   Seatbelts: Pt reports wearing seatbelt when in vehicles.    Hobbies: tv, church and church activities, volunteer work                                                                                                   Social Drivers of Corporate investment banker Strain: High Risk (12/24/2023)   Overall Financial Resource Strain (CARDIA)    Difficulty of Paying Living Expenses: Very hard  Food Insecurity: Food Insecurity Present (12/24/2023)   Hunger Vital Sign    Worried About Running Out of Food in the Last Year: Never true    Ran Out of Food in the Last Year: Sometimes true  Transportation Needs: Unmet Transportation Needs (12/24/2023)   PRAPARE - Transportation    Lack of Transportation (Medical): Yes    Lack of Transportation (Non-Medical): Yes  Physical Activity: Inactive (02/21/2023)   Exercise Vital Sign    Days of Exercise per Week: 0 days    Minutes of Exercise per Session: 0 min  Stress: No Stress Concern Present (12/24/2023)   [employer] of Occupational Health - Occupational Stress Questionnaire    Feeling of Stress : Only a little  Social Connections: Moderately Isolated (12/24/2023)   Social Connection and Isolation Panel [NHANES]    Frequency of Communication with Friends and Family: More than three times a week    Frequency of Social Gatherings with Friends and Family: Once a week    Attends Religious Services: More than 4 times per year    Active Member of [employer] or Organizations: No    Attends Occupational hygienist Meetings: Patient unable to answer    Marital Status: Never married    Allergies:  Allergies  Allergen Reactions   Bee Venom Hives and Swelling   Propoxyphene Hcl Itching   Amlodipine Besylate Swelling   Hydrocodone  Other (See Comments)    With Vicodin - makes patient "jittery", and "hangover effect - sleepy next day"   Lisinopril Cough    Changed to ARB   Metformin  And Related Diarrhea    GI distress   Metronidazole Other (See Comments)     "just didn't work; my body never did heal from it"   Valsartan Other (See Comments)      "sleep more the next day after I took it; it made me real tired"   Sulfasalazine Other (See Comments)    Other reaction(s): Unknown   Xyzal  [Levocetirizine Dihydrochloride ] Rash    Metabolic Disorder Labs: Lab Results  Component Value Date   HGBA1C 6.4 12/12/2023   MPG 134 (H) 06/17/2012   No results found for: "PROLACTIN" Lab Results  Component Value Date   CHOL 93 (L) 12/12/2022   TRIG 95 12/12/2022   HDL 38 (L) 12/12/2022   CHOLHDL 2.4 12/12/2022   VLDL 16 10/20/2015   LDLCALC 37 12/12/2022   LDLCALC 18 03/27/2022   Lab Results  Component Value Date   TSH 1.370 12/12/2022   TSH 1.570 06/03/2018    Therapeutic Level Labs: No results found for: "LITHIUM" No results found for: "VALPROATE" No results found for: "CBMZ"  Current Medications: Current Outpatient Medications  Medication Sig Dispense Refill   Accu-Chek FastClix Lancets MISC Use to check blood sugars 3x per day. E11.9 306 each 12   ACCU-CHEK GUIDE TEST test strip USE TO CHECK BLOOD SUGAR 3 TIMES DAILY 300 strip 2   acyclovir  (ZOVIRAX ) 200 MG capsule Take one by mouth daily for prophylaxis 90 capsule 3   albuterol  (VENTOLIN  HFA) 108 (90 Base) MCG/ACT inhaler USE 2 INHALATIONS BY MOUTH EVERY 4 HOURS AS NEEDED FOR WHEEZING  OR SHORTNESS OF BREATH 6.7 g 20   Alcohol Swabs (B-D SINGLE USE SWABS REGULAR) PADS USE THREE TIMES DAILY 300 each 3   allopurinol   (ZYLOPRIM ) 300 MG tablet Take 1 tablet (300 mg total) by mouth daily. 100 tablet 2   ammonium lactate  (LAC-HYDRIN ) 12 % lotion APPLY 1 APPLICATION TOPICALLY TWICE DAILY AS NEEDED FOR DRY SKIN (SUBSTITUTED FOR LAC HYDRIN) 400 g 12   aspirin  EC 81 MG tablet Take 81 mg by mouth daily.     atorvastatin  (LIPITOR) 40 MG tablet Take 1 tablet (40 mg total) by mouth daily. 100 tablet 2   Azelastine  HCl 137 MCG/SPRAY SOLN Place into both nostrils.     B-D ULTRAFINE III SHORT PEN 31G X 8 MM MISC USE TO INJECT THREE TIMES A DAY 90 each 3   Blood Glucose Monitoring Suppl (ACCU-CHEK GUIDE) w/Device KIT USE TO CHECK BLOOD SUGAR 3 TIMES DAILY 1 kit 0   budesonide -formoterol  (SYMBICORT ) 160-4.5 MCG/ACT inhaler Inhale 2 puffs into the lungs in the morning and at bedtime. with spacer and rinse mouth afterwards. 30.6 g 3   buPROPion  (WELLBUTRIN ) 100 MG tablet Take 1 tablet (100 mg total) by mouth 2 (two) times daily. 180 tablet 1   Cholecalciferol  (HM VITAMIN D3) 100 MCG (4000 UT) CAPS Take 1 capsule (4,000 Units total) by mouth daily in the afternoon. 100 capsule 3   COMBIGAN  0.2-0.5 % ophthalmic solution INSTILL 1 DROP INTO THE  LEFT EYE EVERY 12 HOURS 5 mL 12   DULoxetine  (CYMBALTA ) 60 MG capsule Take 1 capsule (60 mg total) by mouth daily. 90 capsule 1   EPINEPHRINE  0.3 mg/0.3 mL IJ SOAJ injection INJECT 1 PEN INTRAMUSCULARLY AS  NEEDED FOR ALLERGIC RESPONSE AS  DIRECTED BY MD. SEEK MEDICAL  HELP AFTER USE 2 each 1   esomeprazole  (NEXIUM ) 40 MG capsule TAKE 1 CAPSULE BY MOUTH DAILY 100 capsule 2   Evolocumab  (REPATHA  SURECLICK) 140 MG/ML SOAJ INJECT 1 PEN SUBCUTANEOUSLY  EVERY 2 WEEKS 6 mL 3   famotidine  (PEPCID ) 40 MG tablet TAKE 1 TABLET BY MOUTH DAILY 100 tablet 2   fluticasone  (FLONASE ) 50 MCG/ACT nasal spray Place 1 spray into both nostrils in the morning and at bedtime. 48 g 3   furosemide  (LASIX ) 40 MG tablet Take one tablet by mouth daily PRN 90 tablet 3   insulin  lispro (HUMALOG  KWIKPEN) 100 UNIT/ML  KwikPen INJECT SUBCUTANEOUSLY 35 UNITS  TWICE DAILY AFTER MEALS AS  DIRECTED 75 mL 2   Insulin  Syringe-Needle U-100 (BD INSULIN  SYRINGE ULTRAFINE) 31G X 15/64" 0.5 ML MISC Use to inject three times a day 300 each 2   irbesartan  (AVAPRO ) 300 MG tablet TAKE 1 TABLET BY MOUTH DAILY 15 tablet 0   LANTUS  SOLOSTAR 100 UNIT/ML Solostar Pen INJECT SUBCUTANEOUSLY 35 UNITS  TWICE DAILY 75 mL 2   latanoprost  (XALATAN ) 0.005 % ophthalmic solution Place 1 drop into both eyes at bedtime. 2.5 mL 3   LINZESS  290 MCG CAPS capsule Take one by mouth MWF     metoprolol  succinate (TOPROL -XL) 50 MG 24 hr tablet TAKE 3 TABLETS BY MOUTH DAILY  WITH OR IMMEDIATELY FOLLOWING A  MEAL 300 tablet 2   montelukast  (SINGULAIR ) 10 MG tablet TAKE 1 TABLET BY MOUTH DAILY 30 tablet 11   Multiple Vitamins-Minerals (MULTIVITAMIN PO) Take 1 tablet by mouth daily.     RESTASIS MULTIDOSE 0.05 % ophthalmic emulsion Place 1 drop into both eyes daily as needed.  3   spironolactone  (ALDACTONE ) 25 MG tablet TAKE 1 TABLET BY MOUTH DAILY  BEFORE SUPPER 100 tablet 3   TRULICITY  1.5 MG/0.5ML SOPN INJECT SUBCUTANEOUSLY THE  CONTENTS OF ONE PEN WEEKLY AS  DIRECTED 6 mL 3   TYRVAYA 0.03 MG/ACT SOLN Place into both nostrils.     No current facility-administered medications for this visit.     Musculoskeletal: Strength & Muscle Tone: within normal limits Gait & Station: unsteady, use walker Patient leans: N/A  Psychiatric Specialty Exam: Review of Systems  HENT:         Uses hearing aid  Eyes:  Positive for visual disturbance.    Blood pressure 123/82, pulse 76, height 5\' 1"  (1.549 m), weight 176 lb (79.8 kg).Body mass index is 33.25 kg/m.  General Appearance: Casual and visual impairment  Eye Contact:  Fair  Speech:  Slow  Volume:  Decreased  Mood:  Euthymic  Affect:  Congruent  Thought Process:  Goal Directed  Orientation:  Full (Time, Place, and Person)  Thought Content: WDL   Suicidal Thoughts:  No  Homicidal Thoughts:  No   Memory:  Immediate;   Good Recent;   Fair Remote;   Fair  Judgement:  Intact  Insight:  Present  Psychomotor Activity:  Decreased  Concentration:  Concentration: Fair and Attention Span: Fair  Recall:  [employer] of Knowledge: Fair  Language: Fair  Akathisia:  No  Handed:  Right  AIMS (if indicated): not done  Assets:  Communication Skills Desire for Improvement Housing Social Support  ADL's:  Intact  Cognition: WNL  Sleep:  good with CPAP   Screenings: GAD-7    Flowsheet Row [facility]  Total GAD-7 Score 3      Mini-Mental    Flowsheet Row Clinical Support from 09/16/2018 in [facility] - A Dept Of [address]. [facility] Office Visit from 05/27/2012 in [facility] - [facility] Dept Of [place]. [facility]  Total Score (max 30 points ) 30 30      PHQ2-9    Flowsheet Row Clinical Support from 12/24/2023 in [facility] - A Dept Of [place]. [facility] Office Visit from 07[facility]24/2023 in [facility] Clinical Support from 02/21/2023 in [facility] - A Dept Of [place]. [facility] Office Visit from 01/08/2023 in [facility] - A Dept Of [place]. [facility] Office Visit from 06/05/2022 in [facility] - A Dept Of [place]. [facility]  PHQ-2 Total Score 0 2 0 0 0  PHQ-9 Total Score 3 4 3 4  0      Flowsheet Row Office Visit from 07/24/2023 in  [facility] Video Visit from 05/16/2022 in [facility] Video Visit from 01/03/2022 in [facility]  C-SSRS RISK CATEGORY No Risk No Risk No Risk        Assessment and Plan: Patient is a stable on current medication.  She has multiple health issues including diabetes, bilateral  congenital cataract, visual impairment, glaucoma and hearing loss.  She is able to do her ADLs and she has a good support system.  She reported her anxiety depression is stable and does not want to change the medication.  She noticed since moving the Wellbutrin  100 mg twice a day had helped her energy levels and motivation to do things.  Continue Cymbalta  60 mg daily and Wellbutrin  100 mg twice a day.  She is using CPAP which is helping her sleep.  She finally happy as machine is fixed now.  She is not interested in therapy.  Recommend to call us  back if she is any question or any concern.  Follow-up in 6 months in person.  Collaboration of Care: Collaboration of Care: Other provider involved in patient's care AEB notes are available in epic to review  Patient/Guardian was advised Release of Information must be obtained prior to any record release in order to collaborate their care with an outside provider. Patient/Guardian was advised if they have not already done so to contact the registration department to sign all necessary forms in order for us  to release information regarding their care.   Consent: Patient/Guardian gives verbal consent for treatment and assignment of benefits for services provided during this visit. Patient/Guardian expressed understanding and agreed to proceed.    Arturo Late, MD 01/22/2024, 11:05 AM

## 2024-01-23 NOTE — Assessment & Plan Note (Signed)
 Unchanged.  May be related to her constipation.  She has had complete workup including GI referral so I do not think we are missing anything.  Reassured.

## 2024-01-23 NOTE — Assessment & Plan Note (Signed)
 Last A1c was in good range so we will make no alterations to her current medical regimen.  Will check her A1c again in 3 to 4 months.

## 2024-01-23 NOTE — Assessment & Plan Note (Signed)
 Excellent control.  Will continue current medications.  She does not need refills.

## 2024-01-23 NOTE — Assessment & Plan Note (Signed)
 I reviewed all of her medications with her.  I would like her to stop the Reglan .  Start taking the Linzess  Monday Wednesday Friday and if she starts to have too much diarrhea then backing off to twice a week.  She has been intermittently using stool softeners and I told her to count her back off on those until we get on a regular schedule with the Linzess .  Follow-up 4 to 6 weeks if this is not getting better, otherwise we will follow this up when I see her back in 3 to 4 months

## 2024-01-23 NOTE — Progress Notes (Signed)
    CHIEF COMPLAINT / HPI: Follow-up diabetes mellitus: Still taking her medicine on her own regimen but seems to forte for her in the past.  No problems with low blood sugar episodes.  No increased thirst surrounding usual frequency of urination 2.  Continued intermittent GI discomfort and chronic left-sided flank pain: She saw the GI doctor.   3.  Still having trouble with constipation and they agreed with Linzess .  She does not have constipation now she has constipation alternating with diarrhea.  She had the GI doctor told her to stop taking so many GI medicines and to talk to her PCP about it. 4.  Hypertension: Taking her medicines without problems.  Does not need refills.   PERTINENT  PMH / PSH: I have reviewed the patient's medications, allergies, past medical and surgical history, smoking status and updated in the EMR as appropriate.   OBJECTIVE:  BP 122/80   Pulse 84   Wt 176 lb 12.8 oz (80.2 kg)   SpO2 92%   BMI 33.41 kg/m  Vital signs reviewed. GENERAL: Well-developed, well-nourished, no acute distress. CARDIOVASCULAR: Regular rate and rhythm no murmur gallop or rub LUNGS: Clear to auscultation bilaterally, no rales or wheeze. ABDOMEN: Soft positive bowel sounds NEURO: No gross focal neurological deficits.  Except for decreased vision. MSK: Movement of extremity x 4.  She is walking with a walker.  Rises from a chair with minimal assistance from her walker.  Gait is somewhat stooped in the trunk region. PSYCH: AxOx4. Good eye contact.. No psychomotor retardation or agitation. Appropriate speech fluency and content. Asks and answers questions appropriately. Mood is congruent. HEENT: Legally blind.  Resting horizontal nystagmus intermittently  ASSESSMENT / PLAN:   Diabetes mellitus type 2 with neurological manifestations (HCC) Last A1c was in good range so we will make no alterations to her current medical regimen.  Will check her A1c again in 3 to 4 months.  HYPERTENSION,  BENIGN SYSTEMIC Excellent control.  Will continue current medications.  She does not need refills.  Chronic idiopathic constipation I reviewed all of her medications with her.  I would like her to stop the Reglan .  Start taking the Linzess  Monday Wednesday Friday and if she starts to have too much diarrhea then backing off to twice a week.  She has been intermittently using stool softeners and I told her to count her back off on those until we get on a regular schedule with the Linzess .  Follow-up 4 to 6 weeks if this is not getting better, otherwise we will follow this up when I see her back in 3 to 4 months  Chronic flank pain Unchanged.  May be related to her constipation.  She has had complete workup including GI referral so I do not think we are missing anything.  Reassured.   [name] Grice MD

## 2024-01-27 NOTE — Progress Notes (Unsigned)
 Follow Up Note  RE: [name] [name] MRN: [id number] DOB: Dec 22, 1950 Date of Office Visit: 01/28/2024  Referring provider: Charise Companion, MD Primary care provider: Charise Companion, MD  Chief Complaint: No chief complaint on file.  History of Present Illness: I had the pleasure of seeing [name] [name] for a follow up visit at the [facility] on 01/27/2024. She is a 73 y.o. female, who is being followed for asthma, allergic rhinitis and LPRD and bee sting allergy . Her previous allergy  office visit was on 06/25/2023 with Dr. Burdette Carolin. Today is a regular follow up visit.  Discussed the use of AI scribe software for clinical note transcription with the patient, who gave verbal consent to proceed.  History of Present Illness            ***  Assessment and Plan: [name] [name] is a 73 y.o. female with: Moderate persistent asthma without complication Well controlled with below regimen. Rare albuterol  use. Still has some DOE. Today's spirometry was normal. Daily controller medication(s): continue Symbicort  160mcg 2 puffs twice a day with spacer and rinse mouth afterwards. Continue Singulair  10mg  daily  May use albuterol  rescue inhaler 2 puffs every 4 to 6 hours as needed for shortness of breath, chest tightness, coughing, and wheezing. May use albuterol  rescue inhaler 2 puffs 5 to 15 minutes prior to strenuous physical activities. Monitor frequency of use - if you need to use it more than twice per week on a consistent basis let us  know.  Recommend annual flu and covid-19 booster vaccine.   Seasonal allergic rhinitis due to pollen Allergic rhinitis due to dust mite Past history - 2018 skin testing was positive to dust mites and tree pollen. Interim history - still has some drainage. Not sure if azelastine  is better than Atrovent  nasal spray. Continue environmental control measures. Use azelastine  nasal spray 1-2 sprays per nostril twice a day as needed for runny  nose/drainage. Use Flonase  (fluticasone ) nasal spray 1 spray per nostril twice a day as needed for nasal congestion.  Nasal saline spray (i.e., Simply Saline) or nasal saline lavage (i.e., NeilMed) is recommended as needed and prior to medicated nasal sprays.   LPRD (laryngopharyngeal reflux disease) Controlled. Continue Nexium  40mg  daily. Continue Pepcid  40mg  daily.     Bee sting allergy  Past history - 2023 bloodwork negative to hymenoptera panel, tryptase 8.4. For mild symptoms you can take over the counter antihistamines such as Benadryl  and monitor symptoms closely. If symptoms worsen or if you have severe symptoms including breathing issues, throat closure, significant swelling, whole body hives, severe diarrhea and vomiting, lightheadedness then inject epinephrine  and seek immediate medical care afterwards. Assessment and Plan              No follow-ups on file.  No orders of the defined types were placed in this encounter.  Lab Orders  No laboratory test(s) ordered today    Diagnostics: Spirometry:  Tracings reviewed. Her effort: {Blank single:19197::"Good reproducible efforts.","It was hard to get consistent efforts and there is a question as to whether this reflects a maximal maneuver.","Poor effort, data can not be interpreted."} FVC: ***L FEV1: ***L, ***% predicted FEV1/FVC ratio: ***% Interpretation: {Blank single:19197::"Spirometry consistent with mild obstructive disease","Spirometry consistent with moderate obstructive disease","Spirometry consistent with severe obstructive disease","Spirometry consistent with possible restrictive disease","Spirometry consistent with mixed obstructive and restrictive disease","Spirometry uninterpretable due to technique","Spirometry consistent with normal pattern","No overt abnormalities noted given today's efforts"}.  Please see scanned spirometry results for details.  Skin  Testing: {Blank single:19197::"Select  foods","Environmental allergy  panel","Environmental allergy  panel and select foods","Food allergy  panel","None","Deferred due to recent antihistamines use"}. *** Results discussed with patient/family.   Medication List:  Current Outpatient Medications  Medication Sig Dispense Refill  . Accu-Chek FastClix Lancets MISC Use to check blood sugars 3x per day. E11.9 306 each 12  . ACCU-CHEK GUIDE TEST test strip USE TO CHECK BLOOD SUGAR 3 TIMES DAILY 300 strip 2  . acyclovir  (ZOVIRAX ) 200 MG capsule Take one by mouth daily for prophylaxis 90 capsule 3  . albuterol  (VENTOLIN  HFA) 108 (90 Base) MCG/ACT inhaler USE 2 INHALATIONS BY MOUTH EVERY 4 HOURS AS NEEDED FOR WHEEZING  OR SHORTNESS OF BREATH 6.7 g 20  . Alcohol Swabs (B-D SINGLE USE SWABS REGULAR) PADS USE THREE TIMES DAILY 300 each 3  . allopurinol  (ZYLOPRIM ) 300 MG tablet Take 1 tablet (300 mg total) by mouth daily. 100 tablet 2  . ammonium lactate  (LAC-HYDRIN ) 12 % lotion APPLY 1 APPLICATION TOPICALLY TWICE DAILY AS NEEDED FOR DRY SKIN (SUBSTITUTED FOR LAC HYDRIN) 400 g 12  . aspirin  EC 81 MG tablet Take 81 mg by mouth daily.    . atorvastatin  (LIPITOR) 40 MG tablet Take 1 tablet (40 mg total) by mouth daily. 100 tablet 2  . Azelastine  HCl 137 MCG/SPRAY SOLN Place into both nostrils.    . B-D ULTRAFINE III SHORT PEN 31G X 8 MM MISC USE TO INJECT THREE TIMES A DAY 90 each 3  . Blood Glucose Monitoring Suppl (ACCU-CHEK GUIDE) w/Device KIT USE TO CHECK BLOOD SUGAR 3 TIMES DAILY 1 kit 0  . budesonide -formoterol  (SYMBICORT ) 160-4.5 MCG/ACT inhaler Inhale 2 puffs into the lungs in the morning and at bedtime. with spacer and rinse mouth afterwards. 30.6 g 3  . buPROPion  (WELLBUTRIN ) 100 MG tablet Take 1 tablet (100 mg total) by mouth 2 (two) times daily. 180 tablet 1  . Cholecalciferol  (HM VITAMIN D3) 100 MCG (4000 UT) CAPS Take 1 capsule (4,000 Units total) by mouth daily in the afternoon. 100 capsule 3  . COMBIGAN  0.2-0.5 % ophthalmic solution  INSTILL 1 DROP INTO THE  LEFT EYE EVERY 12 HOURS 5 mL 12  . DULoxetine  (CYMBALTA ) 60 MG capsule Take 1 capsule (60 mg total) by mouth daily. 90 capsule 1  . EPINEPHRINE  0.3 mg/0.3 mL IJ SOAJ injection INJECT 1 PEN INTRAMUSCULARLY AS  NEEDED FOR ALLERGIC RESPONSE AS  DIRECTED BY MD. SEEK MEDICAL  HELP AFTER USE 2 each 1  . esomeprazole  (NEXIUM ) 40 MG capsule TAKE 1 CAPSULE BY MOUTH DAILY 100 capsule 2  . Evolocumab  (REPATHA  SURECLICK) 140 MG/ML SOAJ INJECT 1 PEN SUBCUTANEOUSLY  EVERY 2 WEEKS 6 mL 3  . famotidine  (PEPCID ) 40 MG tablet TAKE 1 TABLET BY MOUTH DAILY 100 tablet 2  . fluticasone  (FLONASE ) 50 MCG/ACT nasal spray Place 1 spray into both nostrils in the morning and at bedtime. 48 g 3  . furosemide  (LASIX ) 40 MG tablet Take one tablet by mouth daily PRN 90 tablet 3  . insulin  lispro (HUMALOG  KWIKPEN) 100 UNIT/ML KwikPen INJECT SUBCUTANEOUSLY 35 UNITS  TWICE DAILY AFTER MEALS AS  DIRECTED 75 mL 2  . Insulin  Syringe-Needle U-100 (BD INSULIN  SYRINGE ULTRAFINE) 31G X 15/64" 0.5 ML MISC Use to inject three times a day 300 each 2  . irbesartan  (AVAPRO ) 300 MG tablet TAKE 1 TABLET BY MOUTH DAILY 15 tablet 0  . LANTUS  SOLOSTAR 100 UNIT/ML Solostar Pen INJECT SUBCUTANEOUSLY 35 UNITS  TWICE DAILY 75 mL 2  . latanoprost  (XALATAN )  0.005 % ophthalmic solution Place 1 drop into both eyes at bedtime. 2.5 mL 3  . LINZESS  290 MCG CAPS capsule Take one by mouth MWF    . metoprolol  succinate (TOPROL -XL) 50 MG 24 hr tablet TAKE 3 TABLETS BY MOUTH DAILY  WITH OR IMMEDIATELY FOLLOWING A  MEAL 300 tablet 2  . montelukast  (SINGULAIR ) 10 MG tablet TAKE 1 TABLET BY MOUTH DAILY 30 tablet 11  . Multiple Vitamins-Minerals (MULTIVITAMIN PO) Take 1 tablet by mouth daily.    . RESTASIS MULTIDOSE 0.05 % ophthalmic emulsion Place 1 drop into both eyes daily as needed.  3  . spironolactone  (ALDACTONE ) 25 MG tablet TAKE 1 TABLET BY MOUTH DAILY  BEFORE SUPPER 100 tablet 3  . TRULICITY  1.5 MG/0.5ML SOPN INJECT SUBCUTANEOUSLY THE   CONTENTS OF ONE PEN WEEKLY AS  DIRECTED 6 mL 3  . TYRVAYA 0.03 MG/ACT SOLN Place into both nostrils.     No current facility-administered medications for this visit.   Allergies: Allergies  Allergen Reactions  . Bee Venom Hives and Swelling  . Propoxyphene Hcl Itching  . Amlodipine Besylate Swelling  . Hydrocodone  Other (See Comments)    With Vicodin - makes patient "jittery", and "hangover effect - sleepy next day"  . Lisinopril Cough    Changed to ARB  . Metformin  And Related Diarrhea    GI distress  . Metronidazole Other (See Comments)     "just didn't work; my body never did heal from it"  . Valsartan Other (See Comments)      "sleep more the next day after I took it; it made me real tired"  . Sulfasalazine Other (See Comments)    Other reaction(s): Unknown  . Xyzal  [Levocetirizine Dihydrochloride ] Rash   I reviewed her past medical history, social history, family history, and environmental history and no significant changes have been reported from her previous visit.  Review of Systems  Constitutional:  Negative for appetite change, chills, fever and unexpected weight change.  HENT:  Positive for rhinorrhea. Negative for congestion.   Eyes:  Negative for itching.  Respiratory:  Negative for cough, chest tightness, shortness of breath and wheezing.   Gastrointestinal:  Negative for abdominal pain.  Skin:  Negative for rash.  Allergic/Immunologic: Positive for environmental allergies. Negative for food allergies.  Neurological:  Negative for headaches.   Objective: There were no vitals taken for this visit. There is no height or weight on file to calculate BMI. Physical Exam Vitals and nursing note reviewed.  Constitutional:      Appearance: She is well-developed.  HENT:     Head: Normocephalic and atraumatic.     Right Ear: External ear normal.     Left Ear: External ear normal.     Nose: Nose normal.  Eyes:     Conjunctiva/sclera: Conjunctivae normal.   Cardiovascular:     Rate and Rhythm: Normal rate and regular rhythm.     Heart sounds: Normal heart sounds. No murmur heard.    No friction rub. No gallop.  Pulmonary:     Effort: Pulmonary effort is normal.     Breath sounds: Normal breath sounds. No wheezing or rales.  Musculoskeletal:     Cervical back: Neck supple.  Skin:    General: Skin is warm.     Findings: No rash.  Neurological:     Mental Status: She is alert and oriented to person, place, and time.  Psychiatric:        Behavior: Behavior normal.  Previous  notes and tests were reviewed. The plan was reviewed with the patient/family, and all questions/concerned were addressed.  It was my pleasure to see [name] [name] today and participate in her care. Please feel free to contact me with any questions or concerns.  Sincerely,  Eudelia Hero, DO Allergy  & Immunology  [facility]  [place] office: [phone] [facility] office: [phone]

## 2024-01-28 ENCOUNTER — Ambulatory Visit (INDEPENDENT_AMBULATORY_CARE_PROVIDER_SITE_OTHER): Admitting: Allergy

## 2024-01-28 ENCOUNTER — Other Ambulatory Visit: Payer: Self-pay

## 2024-01-28 ENCOUNTER — Encounter: Payer: Self-pay | Admitting: Allergy

## 2024-01-28 VITALS — BP 122/78 | HR 73 | Temp 98.1°F | Resp 16 | Ht 61.0 in | Wt 172.2 lb

## 2024-01-28 DIAGNOSIS — K219 Gastro-esophageal reflux disease without esophagitis: Secondary | ICD-10-CM

## 2024-01-28 DIAGNOSIS — J4540 Moderate persistent asthma, uncomplicated: Secondary | ICD-10-CM | POA: Diagnosis not present

## 2024-01-28 DIAGNOSIS — J301 Allergic rhinitis due to pollen: Secondary | ICD-10-CM

## 2024-01-28 DIAGNOSIS — Z91030 Bee allergy status: Secondary | ICD-10-CM | POA: Diagnosis not present

## 2024-01-28 DIAGNOSIS — J3089 Other allergic rhinitis: Secondary | ICD-10-CM

## 2024-01-28 NOTE — Patient Instructions (Addendum)
 Moderate persistent asthma  Daily controller medication(s): Symbicort  160mcg 2 puffs twice a day with spacer and rinse mouth afterwards. Continue Singulair  10mg  daily  May use albuterol  rescue inhaler 2 puffs every 4 to 6 hours as needed for shortness of breath, chest tightness, coughing, and wheezing. May use albuterol  rescue inhaler 2 puffs 5 to 15 minutes prior to strenuous physical activities. Monitor frequency of use - if you need to use it more than twice per week on a consistent basis let us  know.  Asthma control goals:  Full participation in all desired activities (may need albuterol  before activity) Albuterol  use two times or less a week on average (not counting use with activity) Cough interfering with sleep two times or less a month Oral steroids no more than once a year No hospitalizations   Seasonal and perennial allergic rhinitis 2018 skin testing positive to dust mites and tree pollen. Continue environmental control measures. Use azelastine  nasal spray 1-2 sprays per nostril twice a day as needed for runny nose/drainage. Use Flonase  (fluticasone ) nasal spray 1 spray per nostril twice a day as needed for nasal congestion.  Nasal saline spray (i.e., Simply Saline) or nasal saline lavage (i.e., NeilMed) is recommended as needed and prior to medicated nasal sprays.   LPRD (laryngopharyngeal reflux disease) Continue Nexium  40mg  daily. Continue Pepcid  40mg  daily.     Bee sting allergy  For mild symptoms you can take over the counter antihistamines and monitor symptoms closely.  Zyrtec (cetirizine) 10mg  as needed.  If symptoms worsen or if you have severe symptoms including breathing issues, throat closure, significant swelling, whole body hives, severe diarrhea and vomiting, lightheadedness then use epinephrine  and seek immediate medical care afterwards. Emergency action plan in place.   Follow up in 6 months or sooner if needed.

## 2024-01-29 ENCOUNTER — Other Ambulatory Visit: Payer: Self-pay | Admitting: Family Medicine

## 2024-02-04 ENCOUNTER — Ambulatory Visit
Admission: RE | Admit: 2024-02-04 | Discharge: 2024-02-04 | Disposition: A | Discharge status: Home / Self Care | Source: Ambulatory Visit | Attending: Family Medicine | Admitting: Family Medicine

## 2024-02-04 DIAGNOSIS — Z1231 Encounter for screening mammogram for malignant neoplasm of breast: Secondary | ICD-10-CM | POA: Diagnosis not present

## 2024-02-10 ENCOUNTER — Other Ambulatory Visit

## 2024-03-01 DIAGNOSIS — G4733 Obstructive sleep apnea (adult) (pediatric): Secondary | ICD-10-CM | POA: Diagnosis not present

## 2024-03-10 ENCOUNTER — Other Ambulatory Visit: Payer: Self-pay | Admitting: Internal Medicine

## 2024-03-18 ENCOUNTER — Encounter: Payer: Self-pay | Admitting: Podiatry

## 2024-03-18 ENCOUNTER — Ambulatory Visit (INDEPENDENT_AMBULATORY_CARE_PROVIDER_SITE_OTHER): Admitting: Podiatry

## 2024-03-18 DIAGNOSIS — B351 Tinea unguium: Secondary | ICD-10-CM

## 2024-03-18 DIAGNOSIS — E1142 Type 2 diabetes mellitus with diabetic polyneuropathy: Secondary | ICD-10-CM

## 2024-03-18 DIAGNOSIS — M79674 Pain in right toe(s): Secondary | ICD-10-CM

## 2024-03-18 DIAGNOSIS — M79675 Pain in left toe(s): Secondary | ICD-10-CM

## 2024-03-18 NOTE — Progress Notes (Signed)

## 2024-03-26 ENCOUNTER — Other Ambulatory Visit: Payer: Self-pay | Admitting: Family Medicine

## 2024-03-31 ENCOUNTER — Other Ambulatory Visit: Payer: Self-pay | Admitting: Family Medicine

## 2024-03-31 DIAGNOSIS — E1149 Type 2 diabetes mellitus with other diabetic neurological complication: Secondary | ICD-10-CM

## 2024-04-02 ENCOUNTER — Other Ambulatory Visit: Payer: Self-pay | Admitting: Internal Medicine

## 2024-04-12 ENCOUNTER — Telehealth: Payer: Self-pay | Admitting: Podiatry

## 2024-04-12 NOTE — Telephone Encounter (Signed)
*  in order to proceed with diabetic shoes.

## 2024-04-12 NOTE — Telephone Encounter (Signed)
 Patient states [facility] told her they need a copy of her diagnosis in

## 2024-04-15 DIAGNOSIS — H4052X3 Glaucoma secondary to other eye disorders, left eye, severe stage: Secondary | ICD-10-CM | POA: Diagnosis not present

## 2024-04-15 DIAGNOSIS — E119 Type 2 diabetes mellitus without complications: Secondary | ICD-10-CM | POA: Diagnosis not present

## 2024-04-27 ENCOUNTER — Other Ambulatory Visit: Payer: Self-pay | Admitting: Internal Medicine

## 2024-04-28 NOTE — Progress Notes (Addendum)
 Cardiology Office Note   Date:  04/30/2024  ID:  [name] [name], [name] [name] [birth year], MRN [id number]  PCP:  Rosalynn Camie CROME, MD  Cardiologist:   Vina Gull, MD   Pt presents for follow up of HTN    History of Present Illness: [name] [name] is a 73 y.o. female with a history of HTN, HL, PAT  and aortic sclerosis   I saw the pt in APril 2024  Since seen the Pt denies CP   She says her breathing is good   Denies palpitations.   Current Meds  Medication Sig   Accu-Chek FastClix Lancets MISC Use to check blood sugars 3x per day. E11.9   ACCU-CHEK GUIDE TEST test strip USE TO CHECK BLOOD SUGAR 3 TIMES DAILY   acyclovir  (ZOVIRAX ) 200 MG capsule TAKE 1 CAPSULE BY MOUTH DAILY  FOR PROPHYLAXIS   albuterol  (VENTOLIN  HFA) 108 (90 Base) MCG/ACT inhaler USE 2 INHALATIONS BY MOUTH EVERY 4 HOURS AS NEEDED FOR WHEEZING  OR SHORTNESS OF BREATH   Alcohol Swabs (B-D SINGLE USE SWABS REGULAR) PADS USE THREE TIMES DAILY   allopurinol  (ZYLOPRIM ) 300 MG tablet Take 1 tablet (300 mg total) by mouth daily.   ammonium lactate  (LAC-HYDRIN ) 12 % lotion APPLY 1 APPLICATION TOPICALLY TWICE DAILY AS NEEDED FOR DRY SKIN (SUBSTITUTED FOR LAC HYDRIN)   aspirin  EC 81 MG tablet Take 81 mg by mouth daily.   atorvastatin  (LIPITOR) 40 MG tablet TAKE 1 TABLET BY MOUTH DAILY   Azelastine  HCl 137 MCG/SPRAY SOLN Place into both nostrils.   B-D ULTRAFINE III SHORT PEN 31G X 8 MM MISC USE TO INJECT THREE TIMES A DAY   Blood Glucose Monitoring Suppl (ACCU-CHEK GUIDE) w/Device KIT USE TO CHECK BLOOD SUGAR 3 TIMES DAILY   budesonide -formoterol  (SYMBICORT ) 160-4.5 MCG/ACT inhaler Inhale 2 puffs into the lungs in the morning and at bedtime. with spacer and rinse mouth afterwards.   buPROPion  (WELLBUTRIN ) 100 MG tablet Take 1 tablet (100 mg total) by mouth 2 (two) times daily.   Cholecalciferol  (HM VITAMIN D3) 100 MCG (4000 UT) CAPS Take 1 capsule (4,000 Units total) by mouth daily in the afternoon.   COMBIGAN  0.2-0.5  % ophthalmic solution INSTILL 1 DROP INTO THE  LEFT EYE EVERY 12 HOURS   DULoxetine  (CYMBALTA ) 60 MG capsule Take 1 capsule (60 mg total) by mouth daily.   EPINEPHRINE  0.3 mg/0.3 mL IJ SOAJ injection INJECT 1 PEN INTRAMUSCULARLY AS  NEEDED FOR ALLERGIC RESPONSE AS  DIRECTED BY MD. [facility]  [facility]LP AFTER USE   esomeprazole  (NEXIUM ) 40 MG capsule TAKE 1 CAPSULE BY MOUTH DAILY   Evolocumab  (REPATHA  SURECLICK) 140 MG/ML SOAJ INJECT 1 PEN SUBCUTANEOUSLY  EVERY 2 WEEKS   famotidine  (PEPCID ) 40 MG tablet TAKE 1 TABLET BY MOUTH DAILY   fluticasone  (FLONASE ) 50 MCG/ACT nasal spray Place 1 spray into both nostrils in the morning and at bedtime.   furosemide  (LASIX ) 40 MG tablet Take one tablet by mouth daily PRN   insulin  lispro (HUMALOG  KWIKPEN) 100 UNIT/ML KwikPen INJECT SUBCUTANEOUSLY 35 UNITS  TWICE DAILY AFTER MEALS AS  DIRECTED   Insulin  Syringe-Needle U-100 (BD INSULIN  SYRINGE ULTRAFINE) 31G X 15/64 0.5 ML MISC Use to inject three times a day   irbesartan  (AVAPRO ) 300 MG tablet TAKE 1 TABLET BY MOUTH DAILY  (PLEASE KEEP PENDING APPOINTMENT ON 8/29 WITH DR Kadir Azucena FOR FURTHER REFILLS)   LANTUS  SOLOSTAR 100 UNIT/ML Solostar Pen INJECT SUBCUTANEOUSLY 35 UNITS  TWICE DAILY   latanoprost  (XALATAN ) 0.005 %  ophthalmic solution Place 1 drop into both eyes at bedtime.   LINZESS  290 MCG CAPS capsule Take one by mouth MWF   metoprolol  succinate (TOPROL -XL) 50 MG 24 hr tablet TAKE 3 TABLETS BY MOUTH DAILY  WITH OR IMMEDIATELY FOLLOWING A  MEAL (Patient taking differently: Take 50 mg by mouth daily. PER PATIENT TAKING 1 TABLET BY MOUTH DAILY  WITH OR IMMEDIATELY FOLLOWING A  MEAL)   montelukast  (SINGULAIR ) 10 MG tablet TAKE 1 TABLET BY MOUTH DAILY   Multiple Vitamins-Minerals (MULTIVITAMIN PO) Take 1 tablet by mouth daily.   RESTASIS MULTIDOSE 0.05 % ophthalmic emulsion Place 1 drop into both eyes daily as needed.   spironolactone  (ALDACTONE ) 25 MG tablet TAKE 1 TABLET BY MOUTH DAILY  BEFORE SUPPER   TRULICITY   1.5 MG/0.5ML SOAJ INJECT THE CONTENTS OF ONE PEN  SUBCUTANEOUSLY WEEKLY AS  DIRECTED   TYRVAYA 0.03 MG/ACT SOLN Place into both nostrils.     Allergies:   Bee venom, Propoxyphene hcl, Amlodipine besylate, Hydrocodone , Lisinopril, Metformin  and related, Metronidazole, Valsartan, Sulfasalazine, and Xyzal  [levocetirizine dihydrochloride ]   Past Medical History:  Diagnosis Date   Allergy     Angina    Anxiety    Arthritis    Asthma    Blood transfusion    Blood transfusion without reported diagnosis    Cataract    Chronic cough    Now followed by pulmonary   Chronic lower back pain    Complication of anesthesia    just wake up coughing; that's all   Concussion 11/18/2011   fell at dr's office; hit head   COPD (chronic obstructive pulmonary disease) (HCC)    Delayed gastric emptying    Depression    Diabetes mellitus type II    take insulin  & pills   Dyslipidemia    Exertional dyspnea    GERD (gastroesophageal reflux disease)    Glaucoma    Gout    Headache(784.0) 11/18/2011   I've had mild headaches the last couple days   Headache(784.0) 01/08/2012   pretty constant since 11/18/11's concussion   Heart murmur    History of colonoscopy    HTN (hypertension)    Peripheral neuropathy    Pneumonia    i've had it once (11/18/11)   Sleep apnea    on CPAP 12   Syncope 06/16/2012   Vocal cord paralysis, unilateral partial    right    Past Surgical History:  Procedure Laterality Date   ANTERIOR CERVICAL DECOMP/DISCECTOMY FUSION  01/15/2012   Procedure: ANTERIOR CERVICAL DECOMPRESSION/DISCECTOMY FUSION 3 LEVELS;  Surgeon: Alm GORMAN Molt, MD;  Location: [facility];  Service: Neurosurgery;  Laterality: N/A;  Anterior Cervical Decompression Discectomy Fusion Cerivcal three four, cervical four five, cervical five six, cervical six seven   BREAST BIOPSY     bilaterally   BREAST CYST EXCISION     right twice; left once   BREAST REDUCTION SURGERY  04-21-2003    BRONCHOSCOPY  07-2007   CARDIAC CATHETERIZATION  03-02-2004   CARPAL TUNNEL RELEASE     left   CATARACT EXTRACTION  1955; 1979   bilateral; right eye   CESAREAN SECTION  1987   COLONOSCOPY W/ BIOPSIES  11/21/2010   adenoma polyp--no hi grade dysplasia Dr Kristie   EYE SURGERY     KNEE ARTHROSCOPY  08/2000   right   LEFT AND RIGHT HEART CATHETERIZATION WITH CORONARY ANGIOGRAM N/A 01/19/2014   Procedure: LEFT AND RIGHT HEART CATHETERIZATION WITH CORONARY ANGIOGRAM;  Surgeon: Victory LELON Sharps  III, MD;  Location: [facility];  Service: Cardiovascular;  Laterality: N/A;   REDUCTION MAMMAPLASTY Bilateral    SHOULDER ARTHROSCOPY  08/2000   right   SHOULDER ARTHROSCOPY W/ ROTATOR CUFF REPAIR  10/18/2003   left   SPINE SURGERY     T-score  09/02/04   -0.54 (low risk currently)   TOTAL ABDOMINAL HYSTERECTOMY  10-07-2000   TREATMENT FISTULA ANAL     TUBAL LIGATION  1987     Social History:  The patient  reports that she quit smoking about 48 years ago. Her smoking use included cigarettes. She started smoking about 49 years ago. She has a 0.2 pack-year smoking history. She has been exposed to tobacco smoke. She has never used smokeless tobacco. She reports that she does not drink alcohol and does not use drugs.   Family History:  The patient's family history includes Autism in her son; Cancer in her sister; Congestive Heart Failure in her brother; Diabetes in her father and paternal grandmother; Gout in her father; Heart disease in her mother; Heart failure in her brother; Hyperlipidemia in her brother and sister; Hypertension in her brother, father, paternal grandfather, sister, and sister.    ROS:  Please see the history of present illness. All other systems are reviewed and  Negative to the above problem except as noted.    PHYSICAL EXAM: VS:  BP 120/80   Pulse 70   Ht 5' (1.524 m)   Wt 180 lb 6.4 oz (81.8 kg)   SpO2 98%   BMI 35.23 kg/m   GEN: Obese 73 yo , in no acute distress  Neck: no  JVD Cardiac: RRR; NO signif murmurs  Respiratory:  clear to auscultation bilaterally   GI: soft, nontender, obese   Ext  No LE edema    EKG:  EKG is ordered today. NSR   LAD   Lipid Panel    Component Value Date/Time   CHOL 93 (L) 12/12/2022 1056   TRIG 95 12/12/2022 1056   HDL 38 (L) 12/12/2022 1056   CHOLHDL 2.4 12/12/2022 1056   CHOLHDL 3.7 10/20/2015 1212   VLDL 16 10/20/2015 1212   LDLCALC 37 12/12/2022 1056   LDLDIRECT 68 01/10/2021 1440   LDLDIRECT 104 09/11/2016 1152      Wt Readings from Last 3 Encounters:  04/30/24 180 lb 6.4 oz (81.8 kg)  01/28/24 172 lb 3.2 oz (78.1 kg)  01/21/24 176 lb 12.8 oz (80.2 kg)      ASSESSMENT AND PLAN: 1  HTN BP remains controlled     Continue Avapro , Toprl XL and Spironolactone      2  HL  Will recheck lipids today  PT on Repatha , Lipitor  3 CAD  LHC in 2015  Minimal CAD at time   NO CP   Rx risk factors   4Hx of PAT  Pt continues to be symptom free      5  Dizziness   Pt denies any dizziness  6  DM  A1C 6.4   Reviewed diet   Continue meds    Follow up in 1 year    4  Minimal CAD   Pt denies CP       5  Dyspnea Breathing is stable without mask    F/U next winter    Current medicines are reviewed at length with the patient today.  The patient does not have concerns regarding medicines.  Signed, Vina Gull, MD

## 2024-04-29 ENCOUNTER — Ambulatory Visit (HOSPITAL_COMMUNITY): Admitting: Psychiatry

## 2024-04-30 ENCOUNTER — Encounter: Payer: Self-pay | Admitting: Internal Medicine

## 2024-04-30 ENCOUNTER — Ambulatory Visit: Attending: Internal Medicine | Admitting: Internal Medicine

## 2024-04-30 VITALS — BP 120/80 | HR 70 | Ht 60.0 in | Wt 180.4 lb

## 2024-04-30 DIAGNOSIS — R002 Palpitations: Secondary | ICD-10-CM

## 2024-04-30 DIAGNOSIS — E785 Hyperlipidemia, unspecified: Secondary | ICD-10-CM | POA: Diagnosis not present

## 2024-04-30 DIAGNOSIS — I10 Essential (primary) hypertension: Secondary | ICD-10-CM

## 2024-04-30 NOTE — Patient Instructions (Signed)
 Medication Instructions:  No changes *If you need a refill on your cardiac medications before your next appointment, please call your pharmacy*  Lab Work: Today, go to the first floor lab for blood work:  TSH, NMR  If you have labs (blood work) drawn today and your tests are completely normal, you will receive your results only by: MyChart Message (if you have MyChart) OR A paper copy in the mail If you have any lab test that is abnormal or we need to change your treatment, we will call you to review the results.  Testing/Procedures: none  Follow-Up: At [facility], you and your health needs are our priority.  As part of our continuing mission to provide you with exceptional heart care, our providers are all part of one team.  This team includes your primary Cardiologist (physician) and Advanced Practice Providers or APPs (Physician Assistants and Nurse Practitioners) who all work together to provide you with the care you need, when you need it.  Your next appointment:   12 month(s)  Provider:   Vina Gull, MD

## 2024-05-01 ENCOUNTER — Ambulatory Visit: Payer: Self-pay | Admitting: Internal Medicine

## 2024-05-01 LAB — NMR, LIPOPROFILE
Cholesterol, Total: 76 mg/dL — ABNORMAL LOW (ref 100–199)
HDL Particle Number: 29.5 umol/L — ABNORMAL LOW (ref >=30.5)
HDL-C: 40 mg/dL (ref >39)
LDL Particle Number: <300 nmol/L (ref <1000)
LDL-C (NIH Calc): 21 mg/dL (ref 0–99)
LP-IR Score: 73 — ABNORMAL HIGH (ref <=45)
Small LDL Particle Number: 150 nmol/L (ref <=527)
Triglycerides: 66 mg/dL (ref 0–149)

## 2024-05-01 LAB — TSH: TSH: 1.86 u[IU]/mL (ref 0.450–4.500)

## 2024-05-12 ENCOUNTER — Telehealth: Payer: Self-pay

## 2024-05-12 DIAGNOSIS — Z13820 Encounter for screening for osteoporosis: Secondary | ICD-10-CM

## 2024-05-12 DIAGNOSIS — M810 Age-related osteoporosis without current pathological fracture: Secondary | ICD-10-CM

## 2024-05-12 NOTE — Telephone Encounter (Signed)
 Patient calls nurse line in regards to DEXA scan.   She reports she called the breast center to schedule, however they are no longer performing scans.  Order location needs to be updated to [facility].   Will forward to PCP to update.

## 2024-05-17 NOTE — Telephone Encounter (Signed)
 Hi Dr Rosalynn,  Will you put in a referral for the DEXA scan?  Thanks!  Margit

## 2024-05-18 NOTE — Telephone Encounter (Signed)
 done

## 2024-05-20 ENCOUNTER — Other Ambulatory Visit: Payer: Self-pay

## 2024-05-20 MED ORDER — ACCU-CHEK FASTCLIX LANCETS MISC: 12 refills | Status: AC

## 2024-05-20 MED ORDER — ACCU-CHEK GUIDE W/DEVICE KIT: PACK | 0 refills | Status: AC

## 2024-05-20 NOTE — Telephone Encounter (Signed)
 Patient calls nurse line requesting new glucose meter.   Patient reports that her current meter is no longer working.   Requesting new meter and lancets to be sent to [facility].   Pended to encounter.   Chiquita JAYSON English, RN

## 2024-05-22 ENCOUNTER — Other Ambulatory Visit: Payer: Self-pay | Admitting: Internal Medicine

## 2024-05-31 ENCOUNTER — Other Ambulatory Visit (HOSPITAL_COMMUNITY): Payer: Self-pay | Admitting: Psychiatry

## 2024-05-31 DIAGNOSIS — F330 Major depressive disorder, recurrent, mild: Secondary | ICD-10-CM

## 2024-05-31 DIAGNOSIS — F411 Generalized anxiety disorder: Secondary | ICD-10-CM

## 2024-06-01 DIAGNOSIS — G4733 Obstructive sleep apnea (adult) (pediatric): Secondary | ICD-10-CM | POA: Diagnosis not present

## 2024-06-10 ENCOUNTER — Other Ambulatory Visit (HOSPITAL_COMMUNITY): Payer: Self-pay | Admitting: Psychiatry

## 2024-06-10 DIAGNOSIS — F411 Generalized anxiety disorder: Secondary | ICD-10-CM

## 2024-06-10 DIAGNOSIS — F330 Major depressive disorder, recurrent, mild: Secondary | ICD-10-CM

## 2024-06-17 ENCOUNTER — Ambulatory Visit

## 2024-06-17 VITALS — Ht 61.02 in | Wt 180.0 lb

## 2024-06-17 DIAGNOSIS — Z0000 Encounter for general adult medical examination without abnormal findings: Secondary | ICD-10-CM

## 2024-06-17 NOTE — Patient Instructions (Signed)
 [name],  Thank you for taking the time for your Medicare Wellness Visit. I appreciate your continued commitment to your health goals. Please review the care plan we discussed, and feel free to reach out if I can assist you further.  Medicare recommends these wellness visits once per year to help you and your care team stay ahead of potential health issues. These visits are designed to focus on prevention, allowing your provider to concentrate on managing your acute and chronic conditions during your regular appointments.  Please note that Annual Wellness Visits do not include a physical exam. Some assessments may be limited, especially if the visit was conducted virtually. If needed, we may recommend a separate in-person follow-up with your provider.  Ongoing Care Seeing your primary care provider every 3 to 6 months helps us  monitor your health and provide consistent, personalized care.   Referrals If a referral was made during today's visit and you haven't received any updates within two weeks, please contact the referred provider directly to check on the status.  Recommended Screenings:  Health Maintenance  Topic Date Due   Yearly kidney health urinalysis for diabetes  Never done   DTaP/Tdap/Td vaccine (3 - Tdap) 03/02/2018   Eye exam for diabetics  01/31/2023   Complete foot exam   02/04/2024   Flu Shot  04/02/2024   COVID-19 Vaccine (8 - 2025-26 season) 05/03/2024   Hemoglobin A1C  06/12/2024   Yearly kidney function blood test for diabetes  12/11/2024   Medicare Annual Wellness Visit  06/17/2025   Breast Cancer Screening  02/03/2026   Colon Cancer Screening  03/15/2027   Pneumococcal Vaccine for age over 79  Completed   DEXA scan (bone density measurement)  Completed   Hepatitis C Screening  Completed   Zoster (Shingles) Vaccine  Completed   Meningitis B Vaccine  Aged Out       06/17/2024    4:57 PM  Advanced Directives  Does Patient Have a Medical Advance Directive?  No  Would patient like information on creating a medical advance directive? No - Patient declined   Advance Care Planning is important because it: Ensures you receive medical care that aligns with your values, goals, and preferences. Provides guidance to your family and loved ones, reducing the emotional burden of decision-making during critical moments.  Vision: Annual vision screenings are recommended for early detection of glaucoma, cataracts, and diabetic retinopathy. These exams can also reveal signs of chronic conditions such as diabetes and high blood pressure.  Dental: Annual dental screenings help detect early signs of oral cancer, gum disease, and other conditions linked to overall health, including heart disease and diabetes.  Please see the attached documents for additional preventive care recommendations.

## 2024-06-17 NOTE — Progress Notes (Signed)
 Because this visit was a virtual/telehealth visit,  certain criteria was not obtained, such a blood pressure, CBG if applicable, and timed get up and go. Any medications not marked as taking were not mentioned during the medication reconciliation part of the visit. Any vitals not documented were not able to be obtained due to this being a telehealth visit or patient was unable to self-report a recent blood pressure reading due to a lack of equipment at home via telehealth. Vitals that have been documented are verbally provided by the patient.   Subjective:   [name] [name] is a 73 y.o. who presents for a Medicare Wellness preventive visit.  As a reminder, Annual Wellness Visits don't include a physical exam, and some assessments may be limited, especially if this visit is performed virtually. We may recommend an in-person follow-up visit with your provider if needed.  Visit Complete: Virtual I connected with  [name] [name] on 06/17/24 by a audio enabled telemedicine application and verified that I am speaking with the correct person using two identifiers.  Patient Location: Home  Provider Location: Home Office  I discussed the limitations of evaluation and management by telemedicine. The patient expressed understanding and agreed to proceed.  Vital Signs: Because this visit was a virtual/telehealth visit, some criteria may be missing or patient reported. Any vitals not documented were not able to be obtained and vitals that have been documented are patient reported.  VideoDeclined- This patient declined Librarian, academic. Therefore the visit was completed with audio only.  Persons Participating in Visit: Patient.  AWV Questionnaire: No: Patient Medicare AWV questionnaire was not completed prior to this visit.  Cardiac Risk Factors include: advanced age (>10men, >48 women);diabetes mellitus;dyslipidemia;family history of premature  cardiovascular disease;hypertension;obesity (BMI >30kg/m2);sedentary lifestyle     Objective:    Today's Vitals   06/17/24 1653  Weight: 180 lb (81.6 kg)  Height: 5' 1.02 (1.55 m)  PainSc: 0-No pain   Body mass index is 33.99 kg/m.     06/17/2024    4:57 PM 12/24/2023   10:02 AM 04/30/2023   10:51 AM 01/08/2023   10:54 AM 09/05/2022    3:01 PM 06/05/2022    9:03 AM 01/23/2022    8:42 AM  Advanced Directives  Does Patient Have a Medical Advance Directive? No No No No No No No  Would patient like information on creating a medical advance directive? No - Patient declined Yes (MAU/Ambulatory/Procedural Areas - Information given) No - Patient declined No - Patient declined No - Patient declined No - Patient declined No - Patient declined    Current Medications (verified) Outpatient Encounter Medications as of 06/17/2024  Medication Sig   Accu-Chek FastClix Lancets MISC Use to check blood sugars 3x per day. E11.9   ACCU-CHEK GUIDE TEST test strip USE TO CHECK BLOOD SUGAR 3 TIMES DAILY   acyclovir  (ZOVIRAX ) 200 MG capsule TAKE 1 CAPSULE BY MOUTH DAILY  FOR PROPHYLAXIS   albuterol  (VENTOLIN  HFA) 108 (90 Base) MCG/ACT inhaler USE 2 INHALATIONS BY MOUTH EVERY 4 HOURS AS NEEDED FOR WHEEZING  OR SHORTNESS OF BREATH   Alcohol Swabs (B-D SINGLE USE SWABS REGULAR) PADS USE THREE TIMES DAILY   allopurinol  (ZYLOPRIM ) 300 MG tablet Take 1 tablet (300 mg total) by mouth daily.   ammonium lactate  (LAC-HYDRIN ) 12 % lotion APPLY 1 APPLICATION TOPICALLY TWICE DAILY AS NEEDED FOR DRY SKIN (SUBSTITUTED FOR LAC HYDRIN)   aspirin  EC 81 MG tablet Take 81 mg by mouth daily.  atorvastatin  (LIPITOR) 40 MG tablet TAKE 1 TABLET BY MOUTH DAILY   Azelastine  HCl 137 MCG/SPRAY SOLN Place into both nostrils.   B-D ULTRAFINE III SHORT PEN 31G X 8 MM MISC USE TO INJECT THREE TIMES A DAY   Blood Glucose Monitoring Suppl (ACCU-CHEK GUIDE) w/Device KIT USE TO CHECK BLOOD SUGAR 3 TIMES DAILY   budesonide -formoterol   (SYMBICORT ) 160-4.5 MCG/ACT inhaler Inhale 2 puffs into the lungs in the morning and at bedtime. with spacer and rinse mouth afterwards.   buPROPion  (WELLBUTRIN ) 100 MG tablet Take 1 tablet (100 mg total) by mouth 2 (two) times daily.   Cholecalciferol  (HM VITAMIN D3) 100 MCG (4000 UT) CAPS Take 1 capsule (4,000 Units total) by mouth daily in the afternoon.   COMBIGAN  0.2-0.5 % ophthalmic solution INSTILL 1 DROP INTO THE  LEFT EYE EVERY 12 HOURS   DULoxetine  (CYMBALTA ) 60 MG capsule Take 1 capsule (60 mg total) by mouth daily.   EPINEPHRINE  0.3 mg/0.3 mL IJ SOAJ injection INJECT 1 PEN INTRAMUSCULARLY AS  NEEDED FOR ALLERGIC RESPONSE AS  DIRECTED BY MD. [facility]  HELP AFTER USE   esomeprazole  (NEXIUM ) 40 MG capsule TAKE 1 CAPSULE BY MOUTH DAILY   Evolocumab  (REPATHA  SURECLICK) 140 MG/ML SOAJ INJECT 1 PEN SUBCUTANEOUSLY  EVERY 2 WEEKS   famotidine  (PEPCID ) 40 MG tablet TAKE 1 TABLET BY MOUTH DAILY   fluticasone  (FLONASE ) 50 MCG/ACT nasal spray Place 1 spray into both nostrils in the morning and at bedtime.   furosemide  (LASIX ) 40 MG tablet Take one tablet by mouth daily PRN   insulin  lispro (HUMALOG  KWIKPEN) 100 UNIT/ML KwikPen INJECT SUBCUTANEOUSLY 35 UNITS  TWICE DAILY AFTER MEALS AS  DIRECTED   Insulin  Syringe-Needle U-100 (BD INSULIN  SYRINGE ULTRAFINE) 31G X 15/64 0.5 ML MISC Use to inject three times a day   irbesartan  (AVAPRO ) 300 MG tablet TAKE 1 TABLET BY MOUTH DAILY   LANTUS  SOLOSTAR 100 UNIT/ML Solostar Pen INJECT SUBCUTANEOUSLY 35 UNITS  TWICE DAILY   latanoprost  (XALATAN ) 0.005 % ophthalmic solution Place 1 drop into both eyes at bedtime.   LINZESS  290 MCG CAPS capsule Take one by mouth MWF   metoprolol  succinate (TOPROL -XL) 50 MG 24 hr tablet TAKE 3 TABLETS BY MOUTH DAILY  WITH OR IMMEDIATELY FOLLOWING A  MEAL (Patient taking differently: Take 50 mg by mouth daily. PER PATIENT TAKING 1 TABLET BY MOUTH DAILY  WITH OR IMMEDIATELY FOLLOWING A  MEAL)   montelukast  (SINGULAIR ) 10 MG  tablet TAKE 1 TABLET BY MOUTH DAILY   Multiple Vitamins-Minerals (MULTIVITAMIN PO) Take 1 tablet by mouth daily.   RESTASIS MULTIDOSE 0.05 % ophthalmic emulsion Place 1 drop into both eyes daily as needed.   spironolactone  (ALDACTONE ) 25 MG tablet TAKE 1 TABLET BY MOUTH DAILY  BEFORE SUPPER   TRULICITY  1.5 MG/0.5ML SOAJ INJECT THE CONTENTS OF ONE PEN  SUBCUTANEOUSLY WEEKLY AS  DIRECTED   TYRVAYA 0.03 MG/ACT SOLN Place into both nostrils.   No facility-administered encounter medications on file as of 06/17/2024.    Allergies (verified) Bee venom, Propoxyphene hcl, Amlodipine besylate, Hydrocodone , Lisinopril, Metformin  and related, Metronidazole, Valsartan, Sulfasalazine, and Xyzal  [levocetirizine dihydrochloride ]   History: Past Medical History:  Diagnosis Date   Allergy     Angina    Anxiety    Arthritis    Asthma    Blood transfusion    Blood transfusion without reported diagnosis    Cataract    Chronic cough    Now followed by pulmonary   Chronic lower back pain  Complication of anesthesia    just wake up coughing; that's all   Concussion 11/18/2011   fell at dr's office; hit head   COPD (chronic obstructive pulmonary disease) (HCC)    Delayed gastric emptying    Depression    Diabetes mellitus type II    take insulin  & pills   Dyslipidemia    Exertional dyspnea    GERD (gastroesophageal reflux disease)    Glaucoma    Gout    Headache(784.0) 11/18/2011   I've had mild headaches the last couple days   Headache(784.0) 01/08/2012   pretty constant since 11/18/11's concussion   Heart murmur    History of colonoscopy    HTN (hypertension)    Peripheral neuropathy    Pneumonia    i've had it once (11/18/11)   Sleep apnea    on CPAP 12   Syncope 06/16/2012   Vocal cord paralysis, unilateral partial    right   Past Surgical History:  Procedure Laterality Date   ANTERIOR CERVICAL DECOMP/DISCECTOMY FUSION  01/15/2012   Procedure: ANTERIOR CERVICAL  DECOMPRESSION/DISCECTOMY FUSION 3 LEVELS;  Surgeon: Alm GORMAN Molt, MD;  Location: [facility];  Service: Neurosurgery;  Laterality: N/A;  Anterior Cervical Decompression Discectomy Fusion Cerivcal three four, cervical four five, cervical five six, cervical six seven   BREAST BIOPSY     bilaterally   BREAST CYST EXCISION     right twice; left once   BREAST REDUCTION SURGERY  04-21-2003   BRONCHOSCOPY  07-2007   CARDIAC CATHETERIZATION  03-02-2004   CARPAL TUNNEL RELEASE     left   CATARACT EXTRACTION  1955; 1979   bilateral; right eye   CESAREAN SECTION  1987   COLONOSCOPY W/ BIOPSIES  11/21/2010   adenoma polyp--no hi grade dysplasia Dr Kristie   EYE SURGERY     KNEE ARTHROSCOPY  08/2000   right   LEFT AND RIGHT HEART CATHETERIZATION WITH CORONARY ANGIOGRAM N/A 01/19/2014   Procedure: LEFT AND RIGHT HEART CATHETERIZATION WITH CORONARY ANGIOGRAM;  Surgeon: Victory LELON Claudene DOUGLAS, MD;  Location: [facility];  Service: Cardiovascular;  Laterality: N/A;   REDUCTION MAMMAPLASTY Bilateral    SHOULDER ARTHROSCOPY  08/2000   right   SHOULDER ARTHROSCOPY W/ ROTATOR CUFF REPAIR  10/18/2003   left   SPINE SURGERY     T-score  09/02/04   -0.54 (low risk currently)   TOTAL ABDOMINAL HYSTERECTOMY  10-07-2000   TREATMENT FISTULA ANAL     TUBAL LIGATION  1987   Family History  Problem Relation Age of Onset   Heart disease Mother    Diabetes Father    Gout Father    Hypertension Father    Hypertension Sister    Hyperlipidemia Sister    Cancer Sister    Hypertension Sister    Hypertension Brother    Hyperlipidemia Brother    Heart failure Brother    Congestive Heart Failure Brother    Diabetes Paternal Grandmother    Hypertension Paternal Grandfather    Autism Son    Colon cancer Neg Hx    Depression Neg Hx    Anxiety disorder Neg Hx    Social History   Socioeconomic History   Marital status: Single    Spouse name: Not on file   Number of children: 1   Years of education: 12   Highest  education level: 12th grade  Occupational History   Occupation: unemployed    Associate Professor: UNEMPLOYED  Tobacco Use   Smoking status:  Former    Current packs/day: 0.00    Average packs/day: 0.5 packs/day for 0.5 years (0.2 ttl pk-yrs)    Types: Cigarettes    Start date: 03/05/1975    Quit date: 09/03/1975    Years since quitting: 48.8    Passive exposure: Past   Smokeless tobacco: Never   Tobacco comments:    no plans to start again  Vaping Use   Vaping status: Never Used  Substance and Sexual Activity   Alcohol use: No   Drug use: No   Sexual activity: Not Currently    Birth control/protection: Post-menopausal  Other Topics Concern   Not on file  Social History Narrative      Emergency Contact: sister, catherine Reisen [phone]      Who lives with you: Lives alone, in ground level apartment; has smoke alarms. No throw rugs on floor. Has grab bars at tub and toilet.   Any pets: none   Diet: Pt has a varied diet of protein, starch, and vegetables.   Exercise: Not currently exercising   Seatbelts: Pt reports wearing seatbelt when in vehicles.    Hobbies: tv, church and church activities, volunteer work                                                                                                   Social Drivers of Corporate investment banker Strain: High Risk (12/24/2023)   Overall Financial Resource Strain (CARDIA)    Difficulty of Paying Living Expenses: Very hard  Food Insecurity: Food Insecurity Present (12/24/2023)   Hunger Vital Sign    Worried About Running Out of Food in the Last Year: Never true    Ran Out of Food in the Last Year: Sometimes true  Transportation Needs: Unmet Transportation Needs (12/24/2023)   PRAPARE - Transportation    Lack of Transportation (Medical): Yes    Lack of Transportation (Non-Medical): Yes  Physical Activity: Inactive (02/21/2023)   Exercise Vital Sign    Days of Exercise per Week: 0 days    Minutes of Exercise per Session: 0 min   Stress: No Stress Concern Present (12/24/2023)   [employer] of Occupational Health - Occupational Stress Questionnaire    Feeling of Stress : Only a little  Social Connections: Moderately Isolated (12/24/2023)   Social Connection and Isolation Panel    Frequency of Communication with Friends and Family: More than three times a week    Frequency of Social Gatherings with Friends and Family: Once a week    Attends Religious Services: More than 4 times per year    Active Member of [employer] or Organizations: No    Attends Engineer, structural: Patient unable to answer    Marital Status: Never married    Tobacco Counseling Counseling given: Not Answered Tobacco comments: no plans to start again    Clinical Intake:  Pre-visit preparation completed: Yes  Pain : No/denies pain Pain Score: 0-No pain     BMI - recorded: 33.99 Nutritional Status: BMI > 30  Obese Nutritional Risks: None Diabetes: Yes CBG done?: No Did pt.  bring in CBG monitor from home?: No  Lab Results  Component Value Date   HGBA1C 6.4 12/12/2023   HGBA1C 6.4 04/30/2023   HGBA1C 6.2 01/08/2023     How often do you need to have someone help you when you read instructions, pamphlets, or other written materials from your doctor or pharmacy?: 1 - Never What is the last grade level you completed in school?: N/A  Interpreter Needed?: No  Information entered by :: Mariachristina Holle N. Vineta Carone, LPN.   Activities of Daily Living     06/17/2024    4:58 PM 12/24/2023   10:00 AM  In your present state of health, do you have any difficulty performing the following activities:  Hearing? 1   Vision? 1   Difficulty concentrating or making decisions? 0   Walking or climbing stairs? 1   Dressing or bathing? 0   Doing errands, shopping? 0   Preparing Food and eating ? N N  Using the Toilet? N N  In the past six months, have you accidently leaked urine? N N  Do you have problems with loss of bowel control? Y N   Comment  some loose stool intermittently  Managing your Medications? N N  Managing your Finances? N N  Housekeeping or managing your Housekeeping? N Y    Patient Care Team: Rosalynn Camie CROME, MD as PCP - General Okey Vina GAILS, MD as PCP - Cardiology (Cardiology) Kristie Lamprey, MD as Consulting Physician (Gastroenterology) Okey Vina GAILS, MD as Consulting Physician (Cardiology) Neysa Reggy BIRCH, MD as Consulting Physician (Pulmonary Disease) Janit Thresa HERO, DPM as Consulting Physician (Podiatry) Amalia Maude MATSU, RPH-CPP (Pharmacist) Luke Orlan HERO, DO as Consulting Physician (Allergy ) Brutus Dales, MD (Inactive) (Psychiatry) Waylan Cain, MD as Consulting Physician (Ophthalmology) Norleen Casey (Dentistry)  I have updated your Care Teams any recent Medical Services you may have received from other providers in the past year.     Assessment:   This is a routine wellness examination for [facility].  Hearing/Vision screen Hearing Screening - Comments:: Patient wears hearing aids. Vision Screening - Comments:: Patient wears eye glasses and is visually impaired and glaucoma. Dr. Waylan   Goals Addressed               This Visit's Progress     06/17/2024 (pt-stated)        My healthcare goals for 2026 is to stay well, not to fall and to stay out of the hospital.       Depression Screen     06/17/2024    4:59 PM 12/24/2023    9:16 AM 12/12/2023   10:20 AM 07/24/2023   10:52 AM 07/24/2023   10:00 AM 04/30/2023   10:51 AM 02/21/2023    2:09 PM  PHQ 2/9 Scores  PHQ - 2 Score 0 0     0  PHQ- 9 Score 2 3     3   Exception Documentation   Patient refusal   Patient refusal      Information is confidential and restricted. Go to Review Flowsheets to unlock data.    Fall Risk     06/17/2024    4:57 PM 12/24/2023    9:14 AM 07/23/2023   10:11 AM 04/30/2023   10:51 AM 02/21/2023    2:12 PM  Fall Risk   Falls in the past year? 1 1 1 1 1   Number falls in past yr: 1 1 0 1 0  Injury  with Fall? 0 0  0  0  Risk for fall due to : History of fall(s);Impaired balance/gait;Orthopedic patient;Impaired vision Impaired vision;Impaired balance/gait;Impaired mobility  History of fall(s);Impaired mobility;Impaired balance/gait No Fall Risks  Follow up Falls evaluation completed;Education provided   Falls prevention discussed Falls evaluation completed    MEDICARE RISK AT HOME:  Medicare Risk at Home Any stairs in or around the home?: No If so, are there any without handrails?: No Home free of loose throw rugs in walkways, pet beds, electrical cords, etc?: Yes Adequate lighting in your home to reduce risk of falls?: Yes Life alert?: No Use of a cane, walker or w/c?: Yes Grab bars in the bathroom?: Yes Shower chair or bench in shower?: Yes Elevated toilet seat or a handicapped toilet?: Yes (RISER TOP)  TIMED UP AND GO:  Was the test performed?  No  Cognitive Function: Declined/Normal: No cognitive concerns noted by patient or family. Patient alert, oriented, able to answer questions appropriately and recall recent events. No signs of memory loss or confusion.    06/17/2024    4:59 PM 09/16/2018   10:25 AM 05/27/2012    2:00 PM  MMSE - Mini Mental State Exam  Not completed: Unable to complete    Orientation to time  5 5   Orientation to Place  5 5   Registration  3 3   Attention/ Calculation  5 5   Recall  3 3   Language- name 2 objects  2 2   Language- repeat  1 1  Language- follow 3 step command  3 3   Language- read & follow direction  1 1   Write a sentence  1 1   Copy design  1 1   Total score  30 30      Data saved with a previous flowsheet row definition        06/17/2024    5:06 PM 12/24/2023    9:24 AM 02/21/2023    2:13 PM 03/23/2021    8:54 AM 09/16/2018   10:26 AM  6CIT Screen  What Year? 0 points 0 points 0 points 0 points 0 points  What month? 0 points 0 points 0 points 0 points 0 points  What time? 0 points 0 points 0 points 0 points 0 points   Count back from 20 0 points 0 points 0 points 0 points 0 points  Months in reverse 0 points 0 points 4 points 0 points 0 points  Repeat phrase 0 points 10 points 0 points 0 points 0 points  Total Score 0 points 10 points 4 points 0 points 0 points    Immunizations Immunization History  Administered Date(s) Administered   Fluad Quad(high Dose 65+) 05/30/2021, 06/05/2022   Fluad Trivalent(High Dose 65+) 07/23/2023   H1N1 08/11/2008   Influenza Split 05/17/2011, 05/27/2012   Influenza Whole 05/25/2007, 06/08/2008, 06/02/2009, 06/05/2010   Influenza,inj,Quad PF,6+ Mos 04/28/2013, 06/01/2014, 05/18/2015, 05/17/2016, 04/30/2017, 06/03/2018, 07/14/2019, 06/14/2020   PFIZER Comirnaty(Gray Top)Covid-19 Tri-Sucrose Vaccine 01/10/2021   PFIZER(Purple Top)SARS-COV-2 Vaccination 01/14/2020, 02/04/2020, 09/06/2020   Pfizer Covid-19 Vaccine Bivalent Booster 30yrs & up 08/01/2021   Pfizer(Comirnaty)Fall Seasonal Vaccine 12 years and older 09/05/2022, 08/16/2023   Pneumococcal Conjugate-13 10/20/2013   Pneumococcal Polysaccharide-23 06/02/1993, 06/17/2012, 03/19/2018   Td 10/31/1996, 03/02/2008   Zoster Recombinant(Shingrix) 09/14/2022, 11/23/2022    Screening Tests Health Maintenance  Topic Date Due   Diabetic kidney evaluation - Urine ACR  Never done   DTaP/Tdap/Td (3 - Tdap) 03/02/2018   OPHTHALMOLOGY EXAM  01/31/2023   FOOT  EXAM  02/04/2024   Influenza Vaccine  04/02/2024   COVID-19 Vaccine (8 - 2025-26 season) 05/03/2024   HEMOGLOBIN A1C  06/12/2024   Diabetic kidney evaluation - eGFR measurement  12/11/2024   Medicare Annual Wellness (AWV)  06/17/2025   Mammogram  02/03/2026   Colonoscopy  03/15/2027   Pneumococcal Vaccine: 50+ Years  Completed   DEXA SCAN  Completed   Hepatitis C Screening  Completed   Zoster Vaccines- Shingrix  Completed   Meningococcal B Vaccine  Aged Out    Health Maintenance Items Addressed: Yes Vaccines Due: Dtap, Flu, Covid, Diabetic Foot Exam  recommended, Labs Due HGA1C and Diabetic Kidney Eval-Urine ACR  Additional Screening:  Vision Screening: Recommended annual ophthalmology exams for early detection of glaucoma and other disorders of the eye. Is the patient up to date with their annual eye exam?  Yes  Who is the provider or what is the name of the office in which the patient attends annual eye exams? Adine Haddock, MD.  Dental Screening: Recommended annual dental exams for proper oral hygiene  Community Resource Referral / Chronic Care Management: CRR required this visit?  No   CCM required this visit?  No   Plan:    I have personally reviewed and noted the following in the patient's chart:   Medical and social history Use of alcohol, tobacco or illicit drugs  Current medications and supplements including opioid prescriptions. Patient is not currently taking opioid prescriptions. Functional ability and status Nutritional status Physical activity Advanced directives List of other physicians Hospitalizations, surgeries, and ER visits in previous 12 months Vitals Screenings to include cognitive, depression, and falls Referrals and appointments  In addition, I have reviewed and discussed with patient certain preventive protocols, quality metrics, and best practice recommendations. A written personalized care plan for preventive services as well as general preventive health recommendations were provided to patient.   Roz LOISE Fuller, LPN   [id number]   After Visit Summary: (Declined) Due to this being a telephonic visit, with patients personalized plan was offered to patient but patient Declined AVS at this time   Notes: Vaccines Due: Dtap, Flu, Covid, Diabetic Foot Exam recommended, Labs Due HGA1C and Diabetic Kidney Eval-Urine ACR.

## 2024-06-21 ENCOUNTER — Encounter: Payer: Self-pay | Admitting: Podiatry

## 2024-06-21 ENCOUNTER — Ambulatory Visit (INDEPENDENT_AMBULATORY_CARE_PROVIDER_SITE_OTHER): Admitting: Podiatry

## 2024-06-21 DIAGNOSIS — E1142 Type 2 diabetes mellitus with diabetic polyneuropathy: Secondary | ICD-10-CM | POA: Diagnosis not present

## 2024-06-21 DIAGNOSIS — M79675 Pain in left toe(s): Secondary | ICD-10-CM

## 2024-06-21 DIAGNOSIS — B351 Tinea unguium: Secondary | ICD-10-CM

## 2024-06-21 DIAGNOSIS — M79674 Pain in right toe(s): Secondary | ICD-10-CM

## 2024-06-21 NOTE — Progress Notes (Signed)

## 2024-06-23 ENCOUNTER — Ambulatory Visit: Admitting: Family Medicine

## 2024-06-23 ENCOUNTER — Encounter: Payer: Self-pay | Admitting: Family Medicine

## 2024-06-23 VITALS — BP 152/63 | HR 68 | Ht 61.0 in | Wt 183.0 lb

## 2024-06-23 DIAGNOSIS — K219 Gastro-esophageal reflux disease without esophagitis: Secondary | ICD-10-CM

## 2024-06-23 DIAGNOSIS — Z23 Encounter for immunization: Secondary | ICD-10-CM | POA: Diagnosis not present

## 2024-06-23 DIAGNOSIS — R269 Unspecified abnormalities of gait and mobility: Secondary | ICD-10-CM

## 2024-06-23 DIAGNOSIS — H543 Unqualified visual loss, both eyes: Secondary | ICD-10-CM

## 2024-06-23 DIAGNOSIS — I10 Essential (primary) hypertension: Secondary | ICD-10-CM

## 2024-06-23 DIAGNOSIS — K5904 Chronic idiopathic constipation: Secondary | ICD-10-CM

## 2024-06-23 DIAGNOSIS — E1149 Type 2 diabetes mellitus with other diabetic neurological complication: Secondary | ICD-10-CM | POA: Diagnosis not present

## 2024-06-23 LAB — POCT GLYCOSYLATED HEMOGLOBIN (HGB A1C): HbA1c, POC (controlled diabetic range): 6.8 % (ref 0.0–7.0)

## 2024-06-23 MED ORDER — AZELASTINE HCL 137 MCG/SPRAY NA SOLN: NASAL | 12 refills | Status: DC

## 2024-06-23 MED ORDER — LANTUS SOLOSTAR 100 UNIT/ML ~~LOC~~ SOPN: PEN_INJECTOR | SUBCUTANEOUS | Status: DC

## 2024-06-23 MED ORDER — METOCLOPRAMIDE HCL 10 MG PO TABS: 10.0000 mg | ORAL_TABLET | Freq: Every day | ORAL | Status: AC

## 2024-06-23 MED ORDER — INSULIN LISPRO (1 UNIT DIAL) 100 UNIT/ML (KWIKPEN): PEN_INJECTOR | SUBCUTANEOUS | Status: DC

## 2024-06-23 MED ORDER — FLUTICASONE PROPIONATE 50 MCG/ACT NA SUSP: 1.0000 | Freq: Two times a day (BID) | NASAL | 3 refills | Status: DC

## 2024-06-23 NOTE — Patient Instructions (Signed)
 LET ME SEE YOU IN ABOUT 3 MONTHS. GREAT TO SEE YOU!

## 2024-06-24 NOTE — Progress Notes (Signed)
    CHIEF COMPLAINT / HPI: #1.  Diabetes mellitus follow-up.  Still taking her medicines on her own schedule.  No episodes of low blood sugar.  Usually eating 1 large meal a day and then snacking for the rest of the day. 2.  Chronic gastroesophageal reflux previously evaluated by specialist.  Continues to use proton pump inhibitor.  Not having any breakthrough symptoms. 3.  Chronic pain: Currently managing with over-the-counter Tylenol  as needed.  Pain is mostly in her hips and knees and hands.  Gets worse with winter. 4.  Transportation insecurity: Needs me to fill out some paperwork for her continued use of S CAT.  Qualifies secondary to her blindness.   PERTINENT  PMH / PSH: I have reviewed the patient's medications, allergies, past medical and surgical history, smoking status and updated in the EMR as appropriate.   OBJECTIVE:  BP (!) 152/63   Pulse 68   Ht 5' 1 (1.549 m)   Wt 183 lb (83 kg)   SpO2 100%   BMI 34.58 kg/m   Cardiovascular: Regular rate and rhythm lungs: Clear to auscultation bilaterally heent: Resting horizontal nystagmus, blind bilaterally MSK: Walks using a walker with stooped forward posture.  She uses the walker for minimal assistance to get up from seated position.  Needs visual assistance for walking. PSYCH: AxOx4. Good eye contact.. No psychomotor retardation or agitation. Appropriate speech fluency and content. Asks and answers questions appropriately. Mood is congruent.  ASSESSMENT / PLAN:   Diabetes mellitus type 2 with neurological manifestations (HCC) Updated her medication list and we decreased her insulin  to reflect what she is actually taking.  She seems to be doing a good job of managing that and repeat A1c is in good control.  Follow-up 3 months.  HYPERTENSION, BENIGN SYSTEMIC Blood pressure under excellent control will continue current medications.  Will check labs at next office visit.  Legally BLIND, bilateral Filled out paperwork for  scat.  GAIT IMBALANCE I am glad she is regularly using her walker.  Chronic idiopathic constipation She has developed her own regimen for taking care of her constipation.  She restarted her Reglan  once a day and says this works pretty well.  She every once in a while takes Linzess  but not very often.   Camie Mulch MD

## 2024-06-24 NOTE — Assessment & Plan Note (Signed)
 Updated her medication list and we decreased her insulin  to reflect what she is actually taking.  She seems to be doing a good job of managing that and repeat A1c is in good control.  Follow-up 3 months.

## 2024-06-24 NOTE — Assessment & Plan Note (Signed)
 She has developed her own regimen for taking care of her constipation.  She restarted her Reglan  once a day and says this works pretty well.  She every once in a while takes Linzess  but not very often.

## 2024-06-24 NOTE — Assessment & Plan Note (Signed)
 I am glad she is regularly using her walker.

## 2024-06-24 NOTE — Assessment & Plan Note (Addendum)
 Blood pressure under excellent control will continue current medications.  Will check labs at next office visit.

## 2024-06-24 NOTE — Assessment & Plan Note (Signed)
 Filled out paperwork for scat.

## 2024-07-02 ENCOUNTER — Other Ambulatory Visit (HOSPITAL_COMMUNITY): Payer: Self-pay | Admitting: *Deleted

## 2024-07-02 DIAGNOSIS — F330 Major depressive disorder, recurrent, mild: Secondary | ICD-10-CM

## 2024-07-02 DIAGNOSIS — F411 Generalized anxiety disorder: Secondary | ICD-10-CM

## 2024-07-02 MED ORDER — BUPROPION HCL 100 MG PO TABS: 100.0000 mg | ORAL_TABLET | Freq: Two times a day (BID) | ORAL | 0 refills | Status: AC

## 2024-07-09 ENCOUNTER — Other Ambulatory Visit (HOSPITAL_COMMUNITY): Payer: Self-pay | Admitting: Psychiatry

## 2024-07-09 DIAGNOSIS — F411 Generalized anxiety disorder: Secondary | ICD-10-CM

## 2024-07-09 DIAGNOSIS — F330 Major depressive disorder, recurrent, mild: Secondary | ICD-10-CM

## 2024-07-14 ENCOUNTER — Ambulatory Visit: Admitting: Family Medicine

## 2024-07-14 VITALS — BP 126/69 | HR 71 | Ht 61.0 in | Wt 167.0 lb

## 2024-07-14 DIAGNOSIS — R109 Unspecified abdominal pain: Secondary | ICD-10-CM | POA: Diagnosis not present

## 2024-07-14 DIAGNOSIS — Z23 Encounter for immunization: Secondary | ICD-10-CM

## 2024-07-14 NOTE — Patient Instructions (Signed)
 I will send you a note about your labs as well as  your X ray. I plan on seeing you in 3 months as we discussed.

## 2024-07-16 ENCOUNTER — Other Ambulatory Visit

## 2024-07-16 DIAGNOSIS — R109 Unspecified abdominal pain: Secondary | ICD-10-CM

## 2024-07-16 NOTE — Progress Notes (Signed)
   Discussed the use of AI scribe software for clinical note transcription with the patient, who gave verbal consent to proceed.  History of Present Illness   [name] [name] is a 73 year old female who presents with abdominal and back spasms following a fall. Fall October 11 (prior to when i saw her last although at that time she was having no symptoms)  Musculoskeletal pain and spasms - Abdominal and back spasms began approximately one week after a fall - Uncertain about any specific injuries sustained during the fall at the time  Did not seek immediate medical attention after the most recent fall 1 week later starting having back and abdominal spasms/pain/symptoms - Spasms originate on the left side  of lower back and radiate around the stomach and into LLQ - Pain is intermittent, colicky, lasts few seconds to minutes, never really >5- 10 minutes except at night when she finds the discomfort in the low back area more constant;  it interferes with sleep, making it difficult to lie comfortably on either side History of musculoskeletal injury to neck several years ago - Since then she has had neck issues exacerbated by  falls -   Gastrointestinal symptoms - Bowel movements remain mostly irregular without recent changes or increased constipation - - No history of kidney stones        PERTINENT  PMH / PSH: I have reviewed the patient's medications, allergies, past medical and surgical history, smoking status.  Pertinent findings that relate to today's visit / issues include:   Physical Exam   CV RRR BACK: no defects, positive for very slight ttp paravertebral lumbar muscles om left but nomass, no defect noted. No pain with percvussion CVA area on either side ABDOMEN: Abdomen is soft, non-tender, non-distended, without organomegaly. Normal bowel sounds.  Very slight discomfort with fairly deep palpation of LLQ.        Assessment and Plan    Abdominal and back pain (after  fall) Intermittent pain in left lumbar area radiating to abdomen. She noticed it 3 weeks after a fall.  I am not convinced of any association. Differential includes MSK contusion, kidney stone, or constipation. Bowel movements irregular but unchanged and hx of constipation  I believe symptoms most consistent with her chronic constipation but she is very concerned she 'broke something - Ordered abdominal x-ray at [facility] on [address]. - Ordered blood work to assess kidney function. - Advised emergency care if severe pain or signs of kidney stone.  General Health Maintenance  - Administered COVID-19 vaccination.

## 2024-07-17 LAB — COMPREHENSIVE METABOLIC PANEL WITH GFR
ALT: 23 IU/L (ref 0–32)
AST: 22 IU/L (ref 0–40)
Albumin: 3.9 g/dL (ref 3.8–4.8)
Alkaline Phosphatase: 102 IU/L (ref 49–135)
BUN/Creatinine Ratio: 17 (ref 12–28)
BUN: 15 mg/dL (ref 8–27)
Bilirubin Total: 0.4 mg/dL (ref 0.0–1.2)
CO2: 23 mmol/L (ref 20–29)
Calcium: 9.2 mg/dL (ref 8.7–10.3)
Chloride: 103 mmol/L (ref 96–106)
Creatinine, Ser: 0.88 mg/dL (ref 0.57–1.00)
Globulin, Total: 2.4 g/dL (ref 1.5–4.5)
Glucose: 244 mg/dL — ABNORMAL HIGH (ref 70–99)
Potassium: 4.4 mmol/L (ref 3.5–5.2)
Sodium: 140 mmol/L (ref 134–144)
Total Protein: 6.3 g/dL (ref 6.0–8.5)
eGFR: 70 mL/min/1.73 (ref >59)

## 2024-07-19 ENCOUNTER — Ambulatory Visit: Payer: Self-pay | Admitting: Family Medicine

## 2024-07-20 NOTE — Progress Notes (Signed)
 [name] [name]                                          MRN: [id number]   07/20/2024   The [facility] Quality Team Specialist reviewed this patient medical record for the purposes of chart review for care gap closure. The following were reviewed: chart review for care gap closure-kidney health evaluation for diabetes:eGFR  and uACR.    [facility] Quality Team

## 2024-07-22 ENCOUNTER — Ambulatory Visit (HOSPITAL_COMMUNITY): Admitting: Psychiatry

## 2024-07-22 NOTE — Telephone Encounter (Signed)
 Patient calls nurse line requesting lab results.   Lab results discussed with patient per PCP letter.  All questions answered.

## 2024-07-26 ENCOUNTER — Ambulatory Visit
Admission: RE | Admit: 2024-07-26 | Discharge: 2024-07-26 | Disposition: A | Discharge status: Home / Self Care | Source: Ambulatory Visit | Attending: Family Medicine | Admitting: Family Medicine

## 2024-07-28 ENCOUNTER — Telehealth: Payer: Self-pay

## 2024-07-28 NOTE — Telephone Encounter (Signed)
 Patient calls nurse line requesting results of recent abdominal x-ray. Advised that I would forward message to PCP regarding results.   Patient states that she will be at home until about 11 am today.   Chiquita JAYSON English, RN

## 2024-07-29 ENCOUNTER — Encounter: Payer: Self-pay | Admitting: Family Medicine

## 2024-08-03 MED ORDER — LUBIPROSTONE 8 MCG PO CAPS: 8.0000 ug | ORAL_CAPSULE | Freq: Every day | ORAL | 3 refills | Status: AC

## 2024-08-03 NOTE — Addendum Note (Signed)
 Addended byBETHA ROSALYNN CAMIE LITTIE on: 08/03/2024 05:33 PM   Modules accepted: Orders

## 2024-08-03 NOTE — Telephone Encounter (Signed)
 Returned call Constipation Will add fiber but she would prefer it in a pill form

## 2024-08-03 NOTE — Telephone Encounter (Signed)
 Patient returns call to nurse line regarding prior message.   She is asking for returned call from Dr. Rosalynn at [phone].  Thanks.   Chiquita JAYSON English, RN

## 2024-08-06 ENCOUNTER — Other Ambulatory Visit (HOSPITAL_COMMUNITY): Payer: Self-pay | Admitting: Psychiatry

## 2024-08-06 DIAGNOSIS — F411 Generalized anxiety disorder: Secondary | ICD-10-CM

## 2024-08-06 DIAGNOSIS — F330 Major depressive disorder, recurrent, mild: Secondary | ICD-10-CM

## 2024-08-07 ENCOUNTER — Other Ambulatory Visit: Payer: Self-pay | Admitting: Family Medicine

## 2024-08-07 ENCOUNTER — Other Ambulatory Visit: Payer: Self-pay | Admitting: Allergy

## 2024-08-07 DIAGNOSIS — E1149 Type 2 diabetes mellitus with other diabetic neurological complication: Secondary | ICD-10-CM

## 2024-08-09 NOTE — Telephone Encounter (Signed)
 None of fiber meds on her formulary. Will try amitiza  as it is on formulary and might be btter option than linzess  which is not on formulary. Fiber may be too gas forming for her.

## 2024-08-11 ENCOUNTER — Ambulatory Visit: Admitting: Allergy

## 2024-08-11 NOTE — Progress Notes (Signed)
 [name] [name]                                          MRN: [id number]   08/11/2024   The [facility] Quality Team Specialist reviewed this patient medical record for the purposes of chart review for care gap closure. The following were reviewed: chart review for care gap closure-kidney health evaluation for diabetes:eGFR  and uACR.    [facility] Quality Team

## 2024-08-13 ENCOUNTER — Other Ambulatory Visit: Payer: Self-pay | Admitting: Family Medicine

## 2024-09-15 ENCOUNTER — Encounter: Payer: Self-pay | Admitting: Allergy

## 2024-09-15 ENCOUNTER — Ambulatory Visit (INDEPENDENT_AMBULATORY_CARE_PROVIDER_SITE_OTHER): Admitting: Allergy

## 2024-09-15 ENCOUNTER — Other Ambulatory Visit: Payer: Self-pay

## 2024-09-15 VITALS — BP 112/78 | HR 76 | Temp 97.2°F | Resp 16 | Ht 61.0 in | Wt 186.6 lb

## 2024-09-15 DIAGNOSIS — J301 Allergic rhinitis due to pollen: Secondary | ICD-10-CM

## 2024-09-15 DIAGNOSIS — Z91030 Bee allergy status: Secondary | ICD-10-CM

## 2024-09-15 DIAGNOSIS — K219 Gastro-esophageal reflux disease without esophagitis: Secondary | ICD-10-CM | POA: Diagnosis not present

## 2024-09-15 DIAGNOSIS — J4540 Moderate persistent asthma, uncomplicated: Secondary | ICD-10-CM | POA: Diagnosis not present

## 2024-09-15 DIAGNOSIS — J3089 Other allergic rhinitis: Secondary | ICD-10-CM | POA: Diagnosis not present

## 2024-09-15 MED ORDER — BUDESONIDE-FORMOTEROL FUMARATE 160-4.5 MCG/ACT IN AERO: 2.0000 | INHALATION_SPRAY | Freq: Two times a day (BID) | RESPIRATORY_TRACT | 3 refills | Status: AC

## 2024-09-15 MED ORDER — AZELASTINE HCL 0.1 % NA SOLN: 1.0000 | Freq: Two times a day (BID) | NASAL | 3 refills | Status: AC | PRN

## 2024-09-15 MED ORDER — FLUTICASONE PROPIONATE 50 MCG/ACT NA SUSP: 1.0000 | Freq: Every day | NASAL | 3 refills | Status: AC | PRN

## 2024-09-15 MED ORDER — MONTELUKAST SODIUM 10 MG PO TABS: 10.0000 mg | ORAL_TABLET | Freq: Every day | ORAL | 3 refills | Status: AC

## 2024-09-15 NOTE — Progress Notes (Signed)
 "  Follow Up Note  RE: Lequita Meadowcroft MRN: [id number] DOB: Nov 01, 1950 Date of Office Visit: 09/15/2024  Referring provider: Rosalynn Camie CROME, MD Primary care provider: Rosalynn Camie CROME, MD  Chief Complaint: Follow-up, Allergic Rhinitis , and Asthma  History of Present Illness: I had the pleasure of seeing Shaunika Italiano for a follow up visit at the [facility] on 09/15/2024. She is a 74 y.o. female, who is being followed for asthma, allergic rhinitis, LPRD, bee sting allergy . Her previous allergy  office visit was on 01/28/2024 with Dr. Luke. Today is a regular follow up visit.  Discussed the use of AI scribe software for clinical note transcription with the patient, who gave verbal consent to proceed.    She has not needed to use her albuterol  rescue inhaler recently. She continues to use her Symbicort  160 inhaler, taking one puff in the morning and one at night, which she finds sufficient. She also takes Singulair  (montelukast ) at night. She has not required any emergency room visits, urgent care, prednisone , or antibiotics in the past seven months.  She experiences a runny nose from time to time. She uses azelastine  nasal spray twice daily and Flonase  (fluticasone ) as needed only. No epistaxis is reported.  She continues to use Nexium  and Pepcid  for gastroesophageal reflux disease (GERD). She reports occasional constipation. No fever or chills.     Assessment and Plan: Icesis is a 74 y.o. female with: Moderate persistent asthma without complication Well-controlled with Symbicort  1 puff BID. No recent exacerbation. Today's spirometry was normal. Daily controller medication(s): Symbicort  160mcg 1-2 puffs twice a day with spacer and rinse mouth afterwards. Continue Singulair  10mg  daily  May use albuterol  rescue inhaler 2 puffs every 4 to 6 hours as needed for shortness of breath, chest tightness, coughing, and wheezing. May use albuterol  rescue inhaler 2 puffs 5 to 15  minutes prior to strenuous physical activities. Monitor frequency of use - if you need to use it more than twice per week on a consistent basis let us  know.    Seasonal allergic rhinitis due to pollen Allergic rhinitis due to dust mite Past history - 2018 skin testing positive to dust mites and tree pollen. Declined ipratropium in the past.  Interim history - managed with azelastine  and fluticasone  prn. Continue environmental control measures. Use azelastine  nasal spray 1-2 sprays per nostril twice a day as needed for runny nose/drainage. Use Flonase  (fluticasone ) nasal spray 1 spray per nostril twice a day as needed for nasal congestion.  Nasal saline spray (i.e., Simply Saline) or nasal saline lavage (i.e., NeilMed) is recommended as needed and prior to medicated nasal sprays.   LPRD (laryngopharyngeal reflux disease) Flares sometimes.  Continue Nexium  40mg  daily. Continue Pepcid  40mg  daily.     Bee sting allergy  Past history - 2023 bloodwork negative to hymenoptera panel, tryptase 8.4. For mild symptoms you can take over the counter antihistamines and monitor symptoms closely.  Zyrtec (cetirizine) 10mg  as needed.  If symptoms worsen or if you have severe symptoms including breathing issues, throat closure, significant swelling, whole body hives, severe diarrhea and vomiting, lightheadedness then use epinephrine  and seek immediate medical care afterwards. Emergency action plan in place.   Return in about 1 year (around 09/15/2025).  Meds ordered this encounter  Medications   budesonide -formoterol  (SYMBICORT ) 160-4.5 MCG/ACT inhaler    Sig: Inhale 2 puffs into the lungs in the morning and at bedtime. with spacer and rinse mouth afterwards.    Dispense:  30.6  g    Refill:  3   montelukast  (SINGULAIR ) 10 MG tablet    Sig: Take 1 tablet (10 mg total) by mouth at bedtime.    Dispense:  90 tablet    Refill:  3   azelastine  (ASTELIN ) 0.1 % nasal spray    Sig: Place 1-2 sprays into both  nostrils 2 (two) times daily as needed (nasal drainage). Use in each nostril as directed    Dispense:  90 mL    Refill:  3   fluticasone  (FLONASE ) 50 MCG/ACT nasal spray    Sig: Place 1-2 sprays into both nostrils daily as needed (nasal congestion).    Dispense:  48 g    Refill:  3   Lab Orders  No laboratory test(s) ordered today    Diagnostics: Spirometry:  Tracings reviewed. Her effort: It was hard to get consistent efforts and there is a question as to whether this reflects a maximal maneuver. FVC: 1.88L FEV1: 1.27L, 76% predicted FEV1/FVC ratio: 68% Interpretation: No overt abnormalities noted given today's efforts.  Please see scanned spirometry results for details.  Results discussed with patient/family.   Medication List:  Current Outpatient Medications  Medication Sig Dispense Refill   Accu-Chek FastClix Lancets MISC Use to check blood sugars 3x per day. E11.9 306 each 12   ACCU-CHEK GUIDE TEST test strip USE TO CHECK BLOOD SUGAR 3 TIMES DAILY 300 strip 2   acyclovir  (ZOVIRAX ) 200 MG capsule TAKE 1 CAPSULE BY MOUTH DAILY  FOR PROPHYLAXIS 90 capsule 3   albuterol  (VENTOLIN  HFA) 108 (90 Base) MCG/ACT inhaler USE 2 INHALATIONS BY MOUTH EVERY 4 HOURS AS NEEDED FOR WHEEZING  OR SHORTNESS OF BREATH 6.7 g 20   Alcohol Swabs (B-D SINGLE USE SWABS REGULAR) PADS USE THREE TIMES DAILY 300 each 3   allopurinol  (ZYLOPRIM ) 300 MG tablet Take 1 tablet (300 mg total) by mouth daily. 100 tablet 2   aspirin  EC 81 MG tablet Take 81 mg by mouth daily.     atorvastatin  (LIPITOR) 40 MG tablet TAKE 1 TABLET BY MOUTH DAILY 100 tablet 2   azelastine  (ASTELIN ) 0.1 % nasal spray Place 1-2 sprays into both nostrils 2 (two) times daily as needed (nasal drainage). Use in each nostril as directed 90 mL 3   B-D ULTRAFINE III SHORT PEN 31G X 8 MM MISC USE TO INJECT THREE TIMES A DAY 90 each 3   Blood Glucose Monitoring Suppl (ACCU-CHEK GUIDE) w/Device KIT USE TO CHECK BLOOD SUGAR 3 TIMES DAILY 1 kit 0    buPROPion  (WELLBUTRIN ) 100 MG tablet Take 1 tablet (100 mg total) by mouth 2 (two) times daily. 30 tablet 0   Cholecalciferol  (HM VITAMIN D3) 100 MCG (4000 UT) CAPS Take 1 capsule (4,000 Units total) by mouth daily in the afternoon. 100 capsule 3   COMBIGAN  0.2-0.5 % ophthalmic solution INSTILL 1 DROP INTO THE  LEFT EYE EVERY 12 HOURS 5 mL 12   DULoxetine  (CYMBALTA ) 60 MG capsule Take 1 capsule (60 mg total) by mouth daily. 90 capsule 1   EPINEPHRINE  0.3 mg/0.3 mL IJ SOAJ injection INJECT 1 PEN INTRAMUSCULARLY AS  NEEDED FOR ALLERGIC RESPONSE AS  DIRECTED BY MD. SEEK MEDICAL  HELP AFTER USE 2 each 1   esomeprazole  (NEXIUM ) 40 MG capsule TAKE 1 CAPSULE BY MOUTH DAILY 100 capsule 3   Evolocumab  (REPATHA  SURECLICK) 140 MG/ML SOAJ INJECT 1 PEN SUBCUTANEOUSLY  EVERY 2 WEEKS 6 mL 3   famotidine  (PEPCID ) 40 MG tablet TAKE 1 TABLET BY  MOUTH DAILY 100 tablet 2   fluticasone  (FLONASE ) 50 MCG/ACT nasal spray Place 1-2 sprays into both nostrils daily as needed (nasal congestion). 48 g 3   furosemide  (LASIX ) 40 MG tablet Take one tablet by mouth daily PRN 90 tablet 3   insulin  glargine (LANTUS  SOLOSTAR) 100 UNIT/ML Solostar Pen INJECT SUBCUTANEOUSLY 35 UNITS  TWICE DAILY 75 mL 2   insulin  lispro (HUMALOG  KWIKPEN) 100 UNIT/ML KwikPen INJECT SUBCUTANEOUSLY 35 UNITS  TWICE DAILY AFTER MEALS AS  DIRECTED 75 mL 2   Insulin  Syringe-Needle U-100 (BD INSULIN  SYRINGE ULTRAFINE) 31G X 15/64 0.5 ML MISC Use to inject three times a day 300 each 2   irbesartan  (AVAPRO ) 300 MG tablet TAKE 1 TABLET BY MOUTH DAILY 90 tablet 3   latanoprost  (XALATAN ) 0.005 % ophthalmic solution Place 1 drop into both eyes at bedtime. 2.5 mL 3   lubiprostone  (AMITIZA ) 8 MCG capsule Take 1 capsule (8 mcg total) by mouth daily with breakfast. 90 capsule 3   metoCLOPramide  (REGLAN ) 10 MG tablet Take 1 tablet (10 mg total) by mouth daily.     metoprolol  succinate (TOPROL -XL) 50 MG 24 hr tablet Take 1 tablet (50 mg total) by mouth daily. PER  PATIENT TAKING 1 TABLET BY MOUTH DAILY  WITH OR IMMEDIATELY FOLLOWING A  MEAL     montelukast  (SINGULAIR ) 10 MG tablet Take 1 tablet (10 mg total) by mouth at bedtime. 90 tablet 3   Multiple Vitamins-Minerals (MULTIVITAMIN PO) Take 1 tablet by mouth daily.     RESTASIS MULTIDOSE 0.05 % ophthalmic emulsion Place 1 drop into both eyes daily as needed.  3   spironolactone  (ALDACTONE ) 25 MG tablet TAKE 1 TABLET BY MOUTH DAILY  BEFORE SUPPER 100 tablet 3   TRULICITY  1.5 MG/0.5ML SOAJ INJECT THE CONTENTS OF ONE PEN  SUBCUTANEOUSLY WEEKLY AS  DIRECTED 6 mL 3   TYRVAYA 0.03 MG/ACT SOLN Place into both nostrils.     budesonide -formoterol  (SYMBICORT ) 160-4.5 MCG/ACT inhaler Inhale 2 puffs into the lungs in the morning and at bedtime. with spacer and rinse mouth afterwards. 30.6 g 3   No current facility-administered medications for this visit.   Allergies: Allergies[1] I reviewed her past medical history, social history, family history, and environmental history and no significant changes have been reported from her previous visit.  Review of Systems  Constitutional:  Negative for appetite change, chills, fever and unexpected weight change.  HENT:  Positive for rhinorrhea. Negative for congestion.   Eyes:  Negative for itching.  Respiratory:  Negative for cough, chest tightness, shortness of breath and wheezing.   Gastrointestinal:  Positive for constipation. Negative for abdominal pain.  Skin:  Negative for rash.  Allergic/Immunologic: Positive for environmental allergies. Negative for food allergies.  Neurological:  Negative for headaches.    Objective: BP 112/78 (BP Location: Right Arm, Patient Position: Sitting, Cuff Size: Normal)   Pulse 76   Temp (!) 97.2 F (36.2 C) (Temporal)   Resp 16   Ht 5' 1 (1.549 m)   Wt 186 lb 9.6 oz (84.6 kg)   SpO2 100%   BMI 35.26 kg/m  Body mass index is 35.26 kg/m. Physical Exam Vitals and nursing note reviewed.  Constitutional:      Appearance:  She is well-developed.  HENT:     Head: Normocephalic and atraumatic.     Ears:     Comments: B/l hearing aids    Nose: Nose normal.     Mouth/Throat:     Mouth: Mucous membranes are  moist.     Pharynx: Oropharynx is clear.  Eyes:     Conjunctiva/sclera: Conjunctivae normal.  Cardiovascular:     Rate and Rhythm: Normal rate and regular rhythm.     Heart sounds: Normal heart sounds. No murmur heard.    No friction rub. No gallop.  Pulmonary:     Effort: Pulmonary effort is normal.     Breath sounds: Normal breath sounds. No wheezing or rales.  Musculoskeletal:     Cervical back: Neck supple.  Skin:    General: Skin is warm.     Findings: No rash.  Neurological:     Mental Status: She is alert and oriented to person, place, and time.  Psychiatric:        Behavior: Behavior normal.    Previous notes and tests were reviewed. The plan was reviewed with the patient/family, and all questions/concerned were addressed.  It was my pleasure to see Mihika today and participate in her care. Please feel free to contact me with any questions or concerns.  Sincerely,  Orlan Cramp, DO Allergy  & Immunology  [facility]  [place] office: [phone] [facility] office: [phone]    [1]  Allergies Allergen Reactions   Bee Venom Hives and Swelling   Propoxyphene Hcl Itching   Amlodipine Besylate Swelling   Hydrocodone  Other (See Comments)    With Vicodin - makes patient jittery, and hangover effect - sleepy next day   Lisinopril Cough    Changed to ARB   Metformin  And Related Diarrhea    GI distress   Metronidazole Other (See Comments)     just didn't work; my body never did heal from it   Valsartan Other (See Comments)      sleep more the next day after I took it; it made me real tired   Sulfasalazine Other (See Comments)    Other reaction(s): Unknown   Xyzal  [Levocetirizine Dihydrochloride ] Rash   "

## 2024-09-15 NOTE — Patient Instructions (Addendum)
 Moderate persistent asthma  Daily controller medication(s): Symbicort  160mcg 1-2 puffs twice a day with spacer and rinse mouth afterwards. Continue Singulair  10mg  daily  May use albuterol  rescue inhaler 2 puffs every 4 to 6 hours as needed for shortness of breath, chest tightness, coughing, and wheezing. May use albuterol  rescue inhaler 2 puffs 5 to 15 minutes prior to strenuous physical activities. Monitor frequency of use - if you need to use it more than twice per week on a consistent basis let us  know.  Asthma control goals:  Full participation in all desired activities (may need albuterol  before activity) Albuterol  use two times or less a week on average (not counting use with activity) Cough interfering with sleep two times or less a month Oral steroids no more than once a year No hospitalizations   Seasonal and perennial allergic rhinitis 2018 skin testing positive to dust mites and tree pollen. Continue environmental control measures. Use azelastine  nasal spray 1-2 sprays per nostril twice a day as needed for runny nose/drainage. Use Flonase  (fluticasone ) nasal spray 1 spray per nostril twice a day as needed for nasal congestion.  Nasal saline spray (i.e., Simply Saline) or nasal saline lavage (i.e., NeilMed) is recommended as needed and prior to medicated nasal sprays.   LPRD (laryngopharyngeal reflux disease) Continue Nexium  40mg  daily. Continue Pepcid  40mg  daily.     Bee sting allergy  For mild symptoms you can take over the counter antihistamines and monitor symptoms closely.  Zyrtec (cetirizine) 10mg  as needed.  If symptoms worsen or if you have severe symptoms including breathing issues, throat closure, significant swelling, whole body hives, severe diarrhea and vomiting, lightheadedness then use epinephrine  and seek immediate medical care afterwards. Emergency action plan in place.   Follow up in 12 months or sooner if needed.

## 2024-09-20 ENCOUNTER — Ambulatory Visit: Admitting: Podiatry

## 2024-09-22 ENCOUNTER — Encounter: Payer: Self-pay | Admitting: Family Medicine

## 2024-09-22 ENCOUNTER — Ambulatory Visit: Admitting: Family Medicine

## 2024-09-22 VITALS — BP 134/60 | HR 74 | Wt 186.2 lb

## 2024-09-22 DIAGNOSIS — M810 Age-related osteoporosis without current pathological fracture: Secondary | ICD-10-CM | POA: Diagnosis not present

## 2024-09-22 DIAGNOSIS — E1149 Type 2 diabetes mellitus with other diabetic neurological complication: Secondary | ICD-10-CM

## 2024-09-22 LAB — POCT GLYCOSYLATED HEMOGLOBIN (HGB A1C): HbA1c, POC (controlled diabetic range): 7.1 % — AB (ref 0.0–7.0)

## 2024-09-22 MED ORDER — LANTUS SOLOSTAR 100 UNIT/ML ~~LOC~~ SOPN: PEN_INJECTOR | SUBCUTANEOUS | Status: AC

## 2024-09-22 MED ORDER — "INSULIN SYRINGE-NEEDLE U-100 31G X 15/64"" 0.5 ML MISC": 2 refills | Status: AC

## 2024-09-22 MED ORDER — INSULIN LISPRO (1 UNIT DIAL) 100 UNIT/ML (KWIKPEN): PEN_INJECTOR | SUBCUTANEOUS | Status: AC

## 2024-09-22 MED ORDER — LANTUS SOLOSTAR 100 UNIT/ML ~~LOC~~ SOPN: PEN_INJECTOR | SUBCUTANEOUS | Status: DC

## 2024-09-24 NOTE — Progress Notes (Signed)
" ° °  Discussed the use of AI scribe software for clinical note transcription with the patient, who gave verbal consent to proceed.  History of Present Illness   [name] [name] is a 74 year old female who presents for diabetes management.  Glycemic control and insulin  management - A1c is 7.1%. - Uses Humalog  and Lantus  insulin . - Self-adjusts insulin  doses based on eating pattern. - Administers Humalog  10 units once daily after her largest meal, instead of prescribed 35 units twice daily. - Administers Lantus  10 units nightly. - Frequently skips breakfast, which influences insulin  administration timing.  Medication and supplies needs - Requires refill of 31 gauge Ultrafine insulin  pen needles from [facility].  Bone health and diagnostic testing - Requests scheduling of bone density test that was delayed from last year. - Expresses concern about location and accessibility of the testing site.  Transportation limitations - Does not drive and depends on others for transportation.        PERTINENT  PMH / PSH: I have reviewed the patients medications, allergies, past medical and surgical history, smoking status.  Pertinent findings that relate to today's visit / issues include:   Physical Exam     Vital signs reviewed. GENERAL: Well-developed, well-nourished, no acute distress. CARDIOVASCULAR: Regular rate and rhythm no murmur gallop or rub LUNGS: Clear to auscultation bilaterally, no rales or wheeze. ABDOMEN: Soft positive bowel sounds NEURO: blind MSK: Movement of extremity x 4. Using walker         Assessment and Plan    Type 2 diabetes mellitus with neurological complications Diabetes well-controlled with A1c of 7.1%. - Continue Humalog  10 units once daily after largest meal. - Continue Lantus  10 units once daily. - Refilled 31 gauge Ultrafine needles for insulin  pens.  Age-related osteoporosis Bone density test pending due to previous facility  unavailability. - Ordered bone density test at new facility. - Provided instructions for reaching new facility.           "

## 2024-11-17 ENCOUNTER — Ambulatory Visit: Admitting: Podiatry

## 2024-12-22 ENCOUNTER — Encounter: Admitting: Pulmonary Disease

## 2025-05-19 ENCOUNTER — Other Ambulatory Visit (HOSPITAL_BASED_OUTPATIENT_CLINIC_OR_DEPARTMENT_OTHER)
